# Patient Record
Sex: Male | Born: 1955 | Race: White | Hispanic: No | Marital: Married | Smoking: Former smoker
Health system: Southern US, Community
[De-identification: ages and names within clinical notes are randomized; demographics above are authoritative.]

## PROBLEM LIST (undated history)

## (undated) DIAGNOSIS — M109 Gout, unspecified: Secondary | ICD-10-CM

## (undated) DIAGNOSIS — E079 Disorder of thyroid, unspecified: Secondary | ICD-10-CM

## (undated) DIAGNOSIS — E039 Hypothyroidism, unspecified: Secondary | ICD-10-CM

## (undated) DIAGNOSIS — I10 Essential (primary) hypertension: Secondary | ICD-10-CM

## (undated) DIAGNOSIS — Z8619 Personal history of other infectious and parasitic diseases: Secondary | ICD-10-CM

## (undated) DIAGNOSIS — K259 Gastric ulcer, unspecified as acute or chronic, without hemorrhage or perforation: Secondary | ICD-10-CM

## (undated) DIAGNOSIS — F329 Major depressive disorder, single episode, unspecified: Secondary | ICD-10-CM

## (undated) DIAGNOSIS — N189 Chronic kidney disease, unspecified: Secondary | ICD-10-CM

## (undated) DIAGNOSIS — G4733 Obstructive sleep apnea (adult) (pediatric): Secondary | ICD-10-CM

## (undated) DIAGNOSIS — K219 Gastro-esophageal reflux disease without esophagitis: Secondary | ICD-10-CM

## (undated) DIAGNOSIS — M1990 Unspecified osteoarthritis, unspecified site: Secondary | ICD-10-CM

## (undated) DIAGNOSIS — G510 Bell's palsy: Secondary | ICD-10-CM

## (undated) DIAGNOSIS — I509 Heart failure, unspecified: Secondary | ICD-10-CM

## (undated) DIAGNOSIS — D649 Anemia, unspecified: Secondary | ICD-10-CM

## (undated) DIAGNOSIS — F32A Depression, unspecified: Secondary | ICD-10-CM

## (undated) DIAGNOSIS — T7840XA Allergy, unspecified, initial encounter: Secondary | ICD-10-CM

## (undated) DIAGNOSIS — Z9981 Dependence on supplemental oxygen: Secondary | ICD-10-CM

## (undated) DIAGNOSIS — E559 Vitamin D deficiency, unspecified: Secondary | ICD-10-CM

## (undated) DIAGNOSIS — E119 Type 2 diabetes mellitus without complications: Secondary | ICD-10-CM

## (undated) DIAGNOSIS — E785 Hyperlipidemia, unspecified: Secondary | ICD-10-CM

## (undated) DIAGNOSIS — G4730 Sleep apnea, unspecified: Secondary | ICD-10-CM

## (undated) HISTORY — DX: Type 2 diabetes mellitus without complications: E11.9

## (undated) HISTORY — DX: Anemia, unspecified: D64.9

## (undated) HISTORY — DX: Gastric ulcer, unspecified as acute or chronic, without hemorrhage or perforation: K25.9

## (undated) HISTORY — DX: Depression, unspecified: F32.A

## (undated) HISTORY — DX: Hyperlipidemia, unspecified: E78.5

## (undated) HISTORY — DX: Vitamin D deficiency, unspecified: E55.9

## (undated) HISTORY — DX: Heart failure, unspecified: I50.9

## (undated) HISTORY — DX: Gastro-esophageal reflux disease without esophagitis: K21.9

## (undated) HISTORY — DX: Personal history of other infectious and parasitic diseases: Z86.19

## (undated) HISTORY — PX: EYE SURGERY: SHX253

## (undated) HISTORY — DX: Essential (primary) hypertension: I10

## (undated) HISTORY — PX: GALLBLADDER SURGERY: SHX652

## (undated) HISTORY — DX: Obstructive sleep apnea (adult) (pediatric): G47.33

## (undated) HISTORY — DX: Bell's palsy: G51.0

## (undated) HISTORY — DX: Allergy, unspecified, initial encounter: T78.40XA

## (undated) HISTORY — DX: Gout, unspecified: M10.9

## (undated) HISTORY — PX: CATARACT EXTRACTION: SUR2

## (undated) HISTORY — DX: Disorder of thyroid, unspecified: E07.9

## (undated) HISTORY — DX: Major depressive disorder, single episode, unspecified: F32.9

---

## 2009-11-21 DIAGNOSIS — K259 Gastric ulcer, unspecified as acute or chronic, without hemorrhage or perforation: Secondary | ICD-10-CM | POA: Insufficient documentation

## 2009-11-24 ENCOUNTER — Ambulatory Visit: Payer: Self-pay | Admitting: Gastroenterology

## 2009-11-24 LAB — HM COLONOSCOPY: HM Colonoscopy: NORMAL

## 2010-05-03 HISTORY — PX: SHOULDER SURGERY: SHX246

## 2010-08-05 ENCOUNTER — Ambulatory Visit: Payer: Self-pay | Admitting: Family Medicine

## 2011-03-29 ENCOUNTER — Ambulatory Visit: Payer: Self-pay | Admitting: Unknown Physician Specialty

## 2011-03-29 DIAGNOSIS — I10 Essential (primary) hypertension: Secondary | ICD-10-CM

## 2011-03-31 ENCOUNTER — Ambulatory Visit: Payer: Self-pay | Admitting: Unknown Physician Specialty

## 2011-04-28 ENCOUNTER — Inpatient Hospital Stay: Payer: Self-pay | Admitting: Surgery

## 2011-04-28 HISTORY — PX: CHOLECYSTECTOMY: SHX55

## 2011-05-03 LAB — PATHOLOGY REPORT

## 2011-05-10 ENCOUNTER — Ambulatory Visit: Payer: Self-pay | Admitting: Family Medicine

## 2011-06-04 ENCOUNTER — Ambulatory Visit: Payer: Self-pay | Admitting: Family Medicine

## 2011-06-11 ENCOUNTER — Emergency Department: Payer: Self-pay | Admitting: Emergency Medicine

## 2011-06-11 LAB — URINALYSIS, COMPLETE
Bacteria: NONE SEEN
Bilirubin,UR: NEGATIVE
Blood: NEGATIVE
Glucose,UR: 50 mg/dL (ref 0–75)
Hyaline Cast: 3
Leukocyte Esterase: NEGATIVE
Nitrite: NEGATIVE
Ph: 5 (ref 4.5–8.0)
Specific Gravity: 1.014 (ref 1.003–1.030)
Squamous Epithelial: <1
WBC UR: <1 /HPF (ref 0–5)

## 2011-06-11 LAB — COMPREHENSIVE METABOLIC PANEL
Albumin: 3.7 g/dL (ref 3.4–5.0)
Alkaline Phosphatase: 70 U/L (ref 50–136)
BUN: 20 mg/dL — ABNORMAL HIGH (ref 7–18)
Bilirubin,Total: 0.7 mg/dL (ref 0.2–1.0)
Chloride: 102 mmol/L (ref 98–107)
Co2: 25 mmol/L (ref 21–32)
Creatinine: 0.76 mg/dL (ref 0.60–1.30)
EGFR (African American): >60
EGFR (Non-African Amer.): >60
Glucose: 293 mg/dL — ABNORMAL HIGH (ref 65–99)
Osmolality: 289 (ref 275–301)
SGPT (ALT): 50 U/L
Sodium: 138 mmol/L (ref 136–145)

## 2011-06-11 LAB — DIFFERENTIAL
Basophil #: 0 10*3/uL (ref 0.0–0.1)
Basophil %: 0.7 %
Lymphocyte %: 30.9 %
Neutrophil %: 48.4 %

## 2011-06-11 LAB — LIPASE, BLOOD: Lipase: 281 U/L (ref 73–393)

## 2011-06-11 LAB — CBC
HCT: 37 % — ABNORMAL LOW (ref 40.0–52.0)
HGB: 12.7 g/dL — ABNORMAL LOW (ref 13.0–18.0)
MCHC: 34.3 g/dL (ref 32.0–36.0)
RBC: 4.32 10*6/uL — ABNORMAL LOW (ref 4.40–5.90)
WBC: 3.1 10*3/uL — ABNORMAL LOW (ref 3.8–10.6)

## 2011-06-12 LAB — URINE CULTURE

## 2012-03-13 ENCOUNTER — Observation Stay: Payer: Self-pay | Admitting: Specialist

## 2012-03-13 LAB — CBC
HCT: 37.7 % — ABNORMAL LOW (ref 40.0–52.0)
HGB: 13.2 g/dL (ref 13.0–18.0)
MCV: 90 fL (ref 80–100)
RBC: 4.19 10*6/uL — ABNORMAL LOW (ref 4.40–5.90)
WBC: 3.9 10*3/uL (ref 3.8–10.6)

## 2012-03-13 LAB — CK TOTAL AND CKMB (NOT AT ARMC)
CK, Total: 348 U/L — ABNORMAL HIGH (ref 35–232)
CK-MB: 12.4 ng/mL — ABNORMAL HIGH (ref 0.5–3.6)

## 2012-03-13 LAB — URINALYSIS, COMPLETE
Bacteria: NONE SEEN
Bilirubin,UR: NEGATIVE
Blood: NEGATIVE
Glucose,UR: NEGATIVE mg/dL (ref 0–75)
Ketone: NEGATIVE
Leukocyte Esterase: NEGATIVE
Nitrite: NEGATIVE
Specific Gravity: 1.015 (ref 1.003–1.030)
Squamous Epithelial: NONE SEEN
WBC UR: <1 /HPF (ref 0–5)

## 2012-03-13 LAB — PROTIME-INR
INR: 0.9
Prothrombin Time: 12.5 secs (ref 11.5–14.7)

## 2012-03-13 LAB — COMPREHENSIVE METABOLIC PANEL
Albumin: 3.9 g/dL (ref 3.4–5.0)
Alkaline Phosphatase: 74 U/L (ref 50–136)
BUN: 21 mg/dL — ABNORMAL HIGH (ref 7–18)
Calcium, Total: 9.2 mg/dL (ref 8.5–10.1)
Co2: 29 mmol/L (ref 21–32)
Creatinine: 0.76 mg/dL (ref 0.60–1.30)
EGFR (Non-African Amer.): >60
Osmolality: 277 (ref 275–301)
SGOT(AST): 58 U/L — ABNORMAL HIGH (ref 15–37)
SGPT (ALT): 64 U/L (ref 12–78)
Sodium: 138 mmol/L (ref 136–145)

## 2012-03-13 LAB — TROPONIN I
Troponin-I: <0.02 ng/mL
Troponin-I: <0.02 ng/mL

## 2012-03-14 LAB — CBC WITH DIFFERENTIAL/PLATELET
Basophil #: 0.1 10*3/uL (ref 0.0–0.1)
Eosinophil #: 0.2 10*3/uL (ref 0.0–0.7)
HGB: 13.3 g/dL (ref 13.0–18.0)
Lymphocyte %: 28.6 %
MCH: 31.7 pg (ref 26.0–34.0)
MCV: 91 fL (ref 80–100)
Monocyte %: 11.8 %
Neutrophil #: 2.6 10*3/uL (ref 1.4–6.5)
Neutrophil %: 54.1 %
Platelet: 116 10*3/uL — ABNORMAL LOW (ref 150–440)
RBC: 4.21 10*6/uL — ABNORMAL LOW (ref 4.40–5.90)
WBC: 4.9 10*3/uL (ref 3.8–10.6)

## 2012-03-14 LAB — CK TOTAL AND CKMB (NOT AT ARMC)
CK, Total: 284 U/L — ABNORMAL HIGH (ref 35–232)
CK-MB: 9.9 ng/mL — ABNORMAL HIGH (ref 0.5–3.6)

## 2012-03-14 LAB — LIPID PANEL: HDL Cholesterol: 21 mg/dL — ABNORMAL LOW (ref 40–60)

## 2012-03-14 LAB — TROPONIN I: Troponin-I: <0.02 ng/mL

## 2012-03-14 LAB — BASIC METABOLIC PANEL
Anion Gap: 6 — ABNORMAL LOW (ref 7–16)
BUN: 21 mg/dL — ABNORMAL HIGH (ref 7–18)
Chloride: 103 mmol/L (ref 98–107)
Creatinine: 0.85 mg/dL (ref 0.60–1.30)
Glucose: 187 mg/dL — ABNORMAL HIGH (ref 65–99)
Potassium: 4.6 mmol/L (ref 3.5–5.1)

## 2012-03-14 LAB — HEMOGLOBIN A1C: Hemoglobin A1C: 5 % (ref 4.2–6.3)

## 2012-03-14 LAB — MAGNESIUM: Magnesium: 1.9 mg/dL

## 2012-05-05 DIAGNOSIS — M109 Gout, unspecified: Secondary | ICD-10-CM | POA: Insufficient documentation

## 2012-12-18 ENCOUNTER — Ambulatory Visit: Payer: Self-pay | Admitting: Family Medicine

## 2013-05-04 ENCOUNTER — Emergency Department: Payer: Self-pay | Admitting: Emergency Medicine

## 2013-05-04 LAB — CBC WITH DIFFERENTIAL/PLATELET
Basophil #: 0 10*3/uL (ref 0.0–0.1)
Basophil %: 0.8 %
Eosinophil #: 0.1 10*3/uL (ref 0.0–0.7)
Eosinophil %: 1.8 %
HCT: 37.2 % — AB (ref 40.0–52.0)
HGB: 12.6 g/dL — ABNORMAL LOW (ref 13.0–18.0)
LYMPHS ABS: 1.1 10*3/uL (ref 1.0–3.6)
LYMPHS PCT: 19.2 %
MCH: 28.7 pg (ref 26.0–34.0)
MCHC: 33.9 g/dL (ref 32.0–36.0)
MCV: 85 fL (ref 80–100)
Monocyte #: 0.6 x10 3/mm (ref 0.2–1.0)
Monocyte %: 10.7 %
Neutrophil #: 4 10*3/uL (ref 1.4–6.5)
Neutrophil %: 67.5 %
Platelet: 111 10*3/uL — ABNORMAL LOW (ref 150–440)
RBC: 4.39 10*6/uL — ABNORMAL LOW (ref 4.40–5.90)
RDW: 15.3 % — ABNORMAL HIGH (ref 11.5–14.5)
WBC: 5.9 10*3/uL (ref 3.8–10.6)

## 2013-05-04 LAB — COMPREHENSIVE METABOLIC PANEL
ALBUMIN: 3.3 g/dL — AB (ref 3.4–5.0)
Alkaline Phosphatase: 91 U/L
Anion Gap: 5 — ABNORMAL LOW (ref 7–16)
BUN: 25 mg/dL — ABNORMAL HIGH (ref 7–18)
Bilirubin,Total: 0.8 mg/dL (ref 0.2–1.0)
CALCIUM: 9.1 mg/dL (ref 8.5–10.1)
CO2: 32 mmol/L (ref 21–32)
Chloride: 100 mmol/L (ref 98–107)
Creatinine: 0.95 mg/dL (ref 0.60–1.30)
EGFR (Non-African Amer.): >60
Glucose: 123 mg/dL — ABNORMAL HIGH (ref 65–99)
Osmolality: 280 (ref 275–301)
POTASSIUM: 4.4 mmol/L (ref 3.5–5.1)
SGOT(AST): 31 U/L (ref 15–37)
SGPT (ALT): 33 U/L (ref 12–78)
Sodium: 137 mmol/L (ref 136–145)
Total Protein: 7.8 g/dL (ref 6.4–8.2)

## 2013-05-04 LAB — URIC ACID: Uric Acid: 8.4 mg/dL — ABNORMAL HIGH (ref 3.5–7.2)

## 2013-05-04 LAB — PRO B NATRIURETIC PEPTIDE: B-TYPE NATIURETIC PEPTID: 521 pg/mL — AB (ref 0–125)

## 2013-05-04 LAB — SEDIMENTATION RATE: Erythrocyte Sed Rate: 72 mm/hr — ABNORMAL HIGH (ref 0–20)

## 2013-05-07 ENCOUNTER — Ambulatory Visit: Payer: Self-pay | Admitting: Family Medicine

## 2013-05-11 ENCOUNTER — Ambulatory Visit: Payer: Self-pay | Admitting: Family Medicine

## 2013-09-11 ENCOUNTER — Ambulatory Visit: Payer: Self-pay | Admitting: Family Medicine

## 2013-09-25 ENCOUNTER — Ambulatory Visit (INDEPENDENT_AMBULATORY_CARE_PROVIDER_SITE_OTHER): Payer: BC Managed Care – PPO | Admitting: Podiatry

## 2013-09-25 ENCOUNTER — Encounter: Payer: Self-pay | Admitting: Podiatry

## 2013-09-25 ENCOUNTER — Ambulatory Visit (INDEPENDENT_AMBULATORY_CARE_PROVIDER_SITE_OTHER): Payer: BC Managed Care – PPO

## 2013-09-25 VITALS — BP 110/64 | HR 84 | Resp 16 | Ht 71.0 in | Wt 270.0 lb

## 2013-09-25 DIAGNOSIS — M779 Enthesopathy, unspecified: Secondary | ICD-10-CM

## 2013-09-25 DIAGNOSIS — M722 Plantar fascial fibromatosis: Secondary | ICD-10-CM

## 2013-09-25 MED ORDER — DICLOFENAC SODIUM 75 MG PO TBEC: 75.0000 mg | DELAYED_RELEASE_TABLET | Freq: Two times a day (BID) | ORAL | Status: DC

## 2013-09-25 MED ORDER — TRIAMCINOLONE ACETONIDE 10 MG/ML IJ SUSP
10.0000 mg | Freq: Once | INTRAMUSCULAR | Status: AC
Start: 2013-09-25 — End: 2013-09-25
  Administered 2013-09-25: 10 mg

## 2013-09-25 NOTE — Patient Instructions (Signed)

## 2013-09-25 NOTE — Progress Notes (Signed)
   Subjective:    Patient ID: Patrick Hurst, male    DOB: 12/10/1955, 58 y.o.   MRN: [id number]  HPI Comments: Its my right foot. It hurts in the arch, under the toes, ankle and knee. Its been hurting for 3 - 4 months or longer. It hurts to walk. Its gotten worse. i took ibuprofen for my foot. i had gout in both of my feet back in January and dr fisher got rid of that.  Foot Pain Associated symptoms include fatigue and numbness.      Review of Systems  Constitutional: Positive for fatigue.  HENT: Positive for sinus pressure.   Cardiovascular: Positive for leg swelling.  Musculoskeletal:       Joint pain  Skin: Positive for color change.  Neurological: Positive for light-headedness and numbness.  All other systems reviewed and are negative.      Objective:   Physical Exam        Assessment & Plan:

## 2013-09-25 NOTE — Progress Notes (Signed)
Subjective:     Patient ID: Patrick Hurst, male   DOB: [birth year], 58 y.o.   MRN: [id number]  Foot Pain   patient points to right arch stating that has been very sore for at least 3 months and that it's hard to work on hard to walk and he has to work on [employer]. Also stated he was diagnosed with gout in January and is on allopurinol for it   Review of Systems  All other systems reviewed and are negative.      Objective:   Physical Exam  Nursing note and vitals reviewed. Constitutional: He is oriented to person, place, and time.  Cardiovascular: Intact distal pulses.   Musculoskeletal: Normal range of motion.  Neurological: He is oriented to person, place, and time.  Skin: Skin is warm.   neurovascular status intact with patient having moderate equinus condition diminished range of motion of the subtalar midtarsal joint and adequate muscle strength. I noted there to be quite a bit of discomfort in the mid arch area right with edema within the area but it appears the plantar fascia is intact. Digits are well perfused the patient is obese and not in the best of health    Assessment:     What appears to be plantar fasciitis right with inflammation and some compensatory pain that he experiences    Plan:     H&P and x-rays reviewed. Injected the mid arch area right 3 mg Kenalog 5 mg Xylocaine Marcaine mixture and dispensed night splint with all instructions on usage. Placed on voltaren  75 mg twice a day

## 2013-10-09 ENCOUNTER — Encounter: Payer: Self-pay | Admitting: Podiatry

## 2013-10-09 ENCOUNTER — Ambulatory Visit (INDEPENDENT_AMBULATORY_CARE_PROVIDER_SITE_OTHER): Payer: BC Managed Care – PPO | Admitting: Podiatry

## 2013-10-09 VITALS — BP 141/64 | HR 83 | Resp 16

## 2013-10-09 DIAGNOSIS — M722 Plantar fascial fibromatosis: Secondary | ICD-10-CM

## 2013-10-09 DIAGNOSIS — R609 Edema, unspecified: Secondary | ICD-10-CM

## 2013-10-09 MED ORDER — PREDNISONE 10 MG PO KIT: PACK | ORAL | Status: DC

## 2013-10-09 MED ORDER — TRIAMCINOLONE ACETONIDE 10 MG/ML IJ SUSP
10.0000 mg | Freq: Once | INTRAMUSCULAR | Status: AC
Start: 2013-10-09 — End: 2013-10-09
  Administered 2013-10-09: 10 mg

## 2013-10-09 NOTE — Progress Notes (Signed)
Subjective:     Patient ID: Patrick Hurst, male   DOB: 1955/08/18, 58 y.o.   MRN: [id number]  HPI patient states that he has a lot of pain in his mid arch area right and that he has to be on his feet at all times and that it's not been able to rest. States that he's been using his splint at nighttime   Review of Systems     Objective:   Physical Exam Neurovascular status intact no change in health history with discomfort in the mid arch area right with inflammation and fluid with most of the pain more distal than wear was at last visit    Assessment:     Continued severe plantar fasciitis with edema noted of the right arch    Plan:     Reviewed condition and at this point I applied Unna boot after injection to the distal arch and applied air fracture walker to completely immobilize. If any change in color and toes occurs take the boot off immediately if not to leave on for 4 days and I also placed on a steroid Dosepak and advised that I want him to follow his sugar closely for the next several days. Reappoint in 2 weeks to recheck

## 2013-10-10 ENCOUNTER — Telehealth: Payer: Self-pay | Admitting: *Deleted

## 2013-10-10 ENCOUNTER — Telehealth: Payer: Self-pay | Admitting: Podiatry

## 2013-10-10 MED ORDER — PREDNISONE 10 MG PO KIT: PACK | ORAL | Status: DC

## 2013-10-10 NOTE — Telephone Encounter (Signed)
[facility] sent over a fax to specify 6 or 12 day prednisone pack , it is for a 12 day

## 2013-10-10 NOTE — Telephone Encounter (Signed)
LADIES THIS PATIENT CALLED AND WOULD LIKE A LETTER FOR A WORK EXCUSE FOR THE REST OF THE WEEK BECAUSE HE IS HAVING TO "GET USED" TO THE BOOT THAT HE WAS GIVEN YESTERDAY. WOULD YOU LIKE ME TO PROVIDE THIS LETTER FOR HIM?

## 2013-10-12 ENCOUNTER — Encounter: Payer: Self-pay | Admitting: Podiatry

## 2013-10-16 LAB — LIPID PANEL
Cholesterol: 138 mg/dL (ref 0–200)
HDL: 36 mg/dL (ref 35–70)
LDL Cholesterol: 67 mg/dL
TRIGLYCERIDES: 177 mg/dL — AB (ref 40–160)

## 2013-10-23 ENCOUNTER — Ambulatory Visit (INDEPENDENT_AMBULATORY_CARE_PROVIDER_SITE_OTHER): Payer: BC Managed Care – PPO | Admitting: Podiatry

## 2013-10-23 ENCOUNTER — Encounter: Payer: Self-pay | Admitting: Podiatry

## 2013-10-23 DIAGNOSIS — M722 Plantar fascial fibromatosis: Secondary | ICD-10-CM

## 2013-10-23 NOTE — Progress Notes (Signed)
Subjective:     Patient ID: Patrick Hurst, male   DOB: Jul 01, 1955, 58 y.o.   MRN: [id number]  HPI patient states that the boot has helped a lot he still has pain in his arch of the right foot when it removed   Review of Systems     Objective:   Physical Exam Neurovascular status is intact with significant reduction of discomfort right mid arch area with some inflammation still noted upon palpation    Assessment:     Hopefully improvement of plantar fasciitis with possibility for plantar nodule in the right arch with immobilization    Plan:     Continue immobilization and gradually reduce it after 2 weeks. Begin hot and cold soaks in the area and if symptoms were to get bad again we'll need to consider MRI and possible surgery of the arch right

## 2013-11-27 ENCOUNTER — Ambulatory Visit (INDEPENDENT_AMBULATORY_CARE_PROVIDER_SITE_OTHER): Payer: BC Managed Care – PPO | Admitting: Podiatry

## 2013-11-27 DIAGNOSIS — R609 Edema, unspecified: Secondary | ICD-10-CM

## 2013-11-27 DIAGNOSIS — M722 Plantar fascial fibromatosis: Secondary | ICD-10-CM

## 2013-11-27 MED ORDER — TRIAMCINOLONE ACETONIDE 10 MG/ML IJ SUSP
10.0000 mg | Freq: Once | INTRAMUSCULAR | Status: AC
  Administered 2013-11-27: 10 mg

## 2013-11-28 NOTE — Progress Notes (Signed)
Subjective:     Patient ID: Patrick Hurst, male   DOB: October 25, 1955, 58 y.o.   MRN: [id number]  HPI patient states I'm having problems with my right heel the way I did when we started. States it's been bothering him for a couple months and seems to get worse after periods of ambulation.    Review of Systems     Objective:   Physical Exam Neurovascular status intact with discomfort in the plantar heel right at the insertional point of the tendon into the calcaneus with fluid buildup noted    Assessment:     Plantar fasciitis right with inflammation and fluid around the medial tendon    Plan:     H&P reviewed and today reinjected the plantar fascia 3 mg Kenalog 5 of vesica Marcaine mixture and instructed on physical therapy supportive shoe and reappoint 4 weeks for further treatment

## 2013-12-25 ENCOUNTER — Ambulatory Visit (INDEPENDENT_AMBULATORY_CARE_PROVIDER_SITE_OTHER): Payer: BC Managed Care – PPO | Admitting: Podiatry

## 2013-12-25 VITALS — BP 144/75 | HR 75 | Resp 16

## 2013-12-25 DIAGNOSIS — M722 Plantar fascial fibromatosis: Secondary | ICD-10-CM

## 2013-12-25 MED ORDER — DICLOFENAC SODIUM 75 MG PO TBEC: 75.0000 mg | DELAYED_RELEASE_TABLET | Freq: Two times a day (BID) | ORAL | Status: DC

## 2013-12-25 NOTE — Progress Notes (Signed)
Subjective:     Patient ID: Patrick Hurst, male   DOB: April 13, 1956, 58 y.o.   MRN: [id number]  HPI patient presents stating I'm still having pain but it is mildly improved and it really seemed quite a bit improved while is taking the medicine. 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 Patient will be seen back in 8 weeks to see results and it continues to have persistent pain we are going to consider MRI to evaluate

## 2014-02-19 ENCOUNTER — Ambulatory Visit (INDEPENDENT_AMBULATORY_CARE_PROVIDER_SITE_OTHER): Payer: BC Managed Care – PPO | Admitting: Podiatry

## 2014-02-19 ENCOUNTER — Encounter: Payer: Self-pay | Admitting: Podiatry

## 2014-02-19 DIAGNOSIS — M722 Plantar fascial fibromatosis: Secondary | ICD-10-CM

## 2014-02-19 MED ORDER — DICLOFENAC SODIUM 75 MG PO TBEC: 75.0000 mg | DELAYED_RELEASE_TABLET | Freq: Two times a day (BID) | ORAL | Status: DC

## 2014-02-19 MED ORDER — TRIAMCINOLONE ACETONIDE 10 MG/ML IJ SUSP
10.0000 mg | Freq: Once | INTRAMUSCULAR | Status: AC
  Administered 2014-02-19: 10 mg

## 2014-02-19 NOTE — Progress Notes (Signed)
Subjective:     Patient ID: Patrick Hurst, male   DOB: Apr 12, 1956, 58 y.o.   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 Patient is not currently interested in surgery or MRI and we will try to hold off and less it were to become worse or grow in size. Prescribed diclofenac 75 mg twice a day to continue taking as he is tolerating well with no stomach symptoms

## 2014-02-22 ENCOUNTER — Ambulatory Visit: Payer: BC Managed Care – PPO | Admitting: Podiatry

## 2014-04-18 LAB — TSH: TSH: 2.95 u[IU]/mL (ref <=5.90)

## 2014-06-04 LAB — BASIC METABOLIC PANEL
BUN: 22 mg/dL — AB (ref 4–21)
Creatinine: 1.2 mg/dL (ref <=1.3)
Glucose: 194 mg/dL
Potassium: 4.2 mmol/L (ref 3.4–5.3)
Sodium: 143 mmol/L (ref 137–147)

## 2014-06-04 LAB — HEPATIC FUNCTION PANEL
ALT: 32 U/L (ref 10–40)
AST: 35 U/L (ref 14–40)

## 2014-06-18 ENCOUNTER — Encounter: Payer: Self-pay | Admitting: Podiatry

## 2014-06-18 ENCOUNTER — Ambulatory Visit (INDEPENDENT_AMBULATORY_CARE_PROVIDER_SITE_OTHER): Payer: BLUE CROSS/BLUE SHIELD | Admitting: Podiatry

## 2014-06-18 VITALS — BP 143/65 | HR 72 | Resp 18

## 2014-06-18 DIAGNOSIS — M722 Plantar fascial fibromatosis: Secondary | ICD-10-CM

## 2014-06-18 MED ORDER — TRAMADOL HCL 50 MG PO TABS: 50.0000 mg | ORAL_TABLET | Freq: Three times a day (TID) | ORAL | Status: DC

## 2014-06-18 MED ORDER — TRIAMCINOLONE ACETONIDE 10 MG/ML IJ SUSP
10.0000 mg | Freq: Once | INTRAMUSCULAR | Status: AC
  Administered 2014-06-18: 10 mg

## 2014-06-19 NOTE — Progress Notes (Signed)
Subjective:     Patient ID: Patrick Hurst, male   DOB: [birth year], 59 y.o.   MRN: [id number]  HPI patient points to right foot stating I'm doing pretty good with this with discomfort still present but improved from when we started. 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 If any growth were to occur we will do an MRI and I offered to him at this time but he denies wanting it currently as he is not interested in surgery or if there's any other issues associated with this

## 2014-06-21 ENCOUNTER — Ambulatory Visit: Payer: BC Managed Care – PPO | Admitting: Podiatry

## 2014-08-14 LAB — HEMOGLOBIN A1C: HEMOGLOBIN A1C: 5.7 % (ref 4.0–6.0)

## 2014-08-14 LAB — CBC AND DIFFERENTIAL
HEMATOCRIT: 35 % — AB (ref 41–53)
HEMOGLOBIN: 11.8 g/dL — AB (ref 13.5–17.5)
PLATELETS: 126 10*3/uL — AB (ref 150–399)
WBC: 8.8 10^3/mL

## 2014-08-15 ENCOUNTER — Ambulatory Visit
Admit: 2014-08-15 | Disposition: A | Discharge status: Home / Self Care | Payer: Self-pay | Attending: Family Medicine | Admitting: Family Medicine

## 2014-08-20 NOTE — Consult Note (Signed)
Referring Physician:  Gladstone Lighter :   Primary Care Physician:  Gladstone Lighter : [facility], [facility], [address], [place], [place] [address], [place]  Reason for Consult:  Admit Date: 13-Mar-2012   Chief Complaint: L facial droop   Reason for Consult: CVA   History of Present Illness:  History of Present Illness:   59 yo RHD M presents to ER yesterday after waking up with a L facial droop and feeling like his face was slightly numb.  He has never had anything like this before.  He denies any diplopia, arm/leg weakness or numbness or headache.  He does feel like his vision is slightly blurry in the L eye.  ROS:   General denies complaints    HEENT blurry vision in L eye    Lungs no complaints    Cardiac no complaints    GI no complaints    GU no complaints    Musculoskeletal no complaints    Extremities no complaints    Skin no complaints    Neuro L facial droop and some numbness of face    Endocrine no complaints    Psych no complaints   Past Medical/Surgical Hx:  reflux:   history of stomach ulcer:   Hypercholesterolemia:   HTN:   diabetes:   Cholecystectomy:   Shoulder Surgery - Right:   Past Medical/ Surgical Hx:   Past Medical History as above    Past Surgical History as above   Home Medications: Medication Instructions Last Modified Date/Time  Calcium 600+D 600 mg-200 intl units oral tablet 1 tab(s) orally once a day (in the morning) 11-Nov-13 10:10  Fish Oil 1000 mg oral capsule 1 cap(s) orally 2 times a day 11-Nov-13 10:10  metformin 1000 mg oral tablet 1 tab(s) orally 2 times a day 11-Nov-13 10:10  multivitamin 1 tab(s) orally once a day (in the morning) 11-Nov-13 10:10  potassium gluconate 595 mg oral tablet 1 tab(s) orally once a day (in the morning) 11-Nov-13 10:10  PriLOSEC OTC 20 mg oral delayed release tablet 1 tab(s) orally once a day (in the morning) 11-Nov-13 10:10  Vitamin B-12 1000 mcg  oral tablet 1 tab(s) orally once a day (in the morning) 11-Nov-13 10:10  Vitamin C 1000 mg oral tablet 1 tab(s) orally once a day (in the morning) 11-Nov-13 10:10  Humulin R 100 units/mL injectable solution 92 unit(s) subcutaneous once a day (in the morning) and 89 units every evening (U-500 concentrate) 11-Nov-13 10:10  gemfibrozil 600 mg oral tablet 1 tab(s) orally 2 times a day 11-Nov-13 10:10  levothyroxine 75 mcg (0.075 mg) oral tablet 1 tab(s) orally once a day (at bedtime) 11-Nov-13 10:10  hydrochlorothiazide-lisinopril 12.5 mg-20 mg oral tablet 1 tab(s) orally 2 times a day 11-Nov-13 10:10  metoprolol tartrate 50 mg oral tablet 1 tab(s) orally 2 times a day 11-Nov-13 10:10   Allergies:  No Known Allergies:   Vital Signs: **Vital Signs.:   12-Nov-13 08:45   Vital Signs Type Routine   Temperature Temperature (F) 98.5   Celsius 36.9   Temperature Source oral   Pulse Pulse 91   Respirations Respirations 17   Systolic BP Systolic BP 601   Diastolic BP (mmHg) Diastolic BP (mmHg) 91   Mean BP 113   Pulse Ox % Pulse Ox % 93   Pulse Ox Activity Level  At rest   Oxygen Delivery Room Air/ 21 %   Physical Exam:  General: overweight, no acute distress  HEENT: normocephalic, sclera nonicteric, oropharynx clear   Neck: supple, no JVD, no bruits   Chest: CTA B, no wheezing, good movement   Cardiac: RRR, no murmur, 2+ pulses   Extremities: no C/C/E, FROM   Neurologic Exam:  Mental Status: A+Ox4, trace dysarthria, nl language   Cranial Nerves: PERRLA, EOMI, nl VF, L peripheral CN VII palsy, slight decreased hearing on L, tongue midline, shoulder shrug equal   Motor Exam: 5/5 B, nl tone   Deep Tendon Reflexes: trace bilateral, downgoing plantars B   Sensory Exam: intact to temp, pinprick but decreased vibration at the ankles   Coordination: FTN and HTS WNL B   Lab Results: Thyroid:  11-Nov-13 10:11    Thyroid Stimulating Hormone 3.40 (0.45-4.50 (International Unit)   ----------------------- Pregnant patients have  different reference  ranges for TSH:  - - - - - - - - - -  Pregnant, first trimetser:  0.36 - 2.50 uIU/mL)  Hepatic:  11-Nov-13 10:11    Bilirubin, Total 0.5   Alkaline Phosphatase 74   SGPT (ALT) 64   SGOT (AST)  58   Total Protein, Serum 7.8   Albumin, Serum 3.9  Routine Chem:  12-Nov-13 03:21    Glucose, Serum  187   BUN  21   Creatinine (comp) 0.85   Sodium, Serum 138   Potassium, Serum 4.6   Chloride, Serum 103   CO2, Serum 29   Calcium (Total), Serum 8.9   Anion Gap  6   Osmolality (calc) 284   eGFR (African American) >60   eGFR (Non-African American) >60 (eGFR values <33m/min/1.73 m2 may be an indication of chronic kidney disease (CKD). Calculated eGFR is useful in patients with stable renal function. The eGFR calculation will not be reliable in acutely ill patients when serum creatinine is changing rapidly. It is not useful in  patients on dialysis. The eGFR calculation may not be applicable to patients at the low and high extremes of body sizes, pregnant women, and vegetarians.)   Magnesium, Serum 1.9 (1.8-2.4 THERAPEUTIC RANGE: 4-7 mg/dL TOXIC: > 10 mg/dL  -----------------------)   Hemoglobin A1c ([facility]) 5.0 (The American Diabetes Association recommends that a primary goal of therapy should be <7% and that physicians should reevaluate the treatment regimen in patients with HbA1c values consistently >8%.)   Cholesterol, Serum 180   Triglycerides, Serum  594   HDL (INHOUSE)  21   VLDL Cholesterol Calculated SEE COMMENT   LDL Cholesterol Calculated SEE COMMENT   Result Comment LDL/VLDL - Unable to report VLDL and LDL due to a  - Triglyceride value that is 400 mg/dL or   - greater....tpl  Result(s) reported on 14 Mar 2012 at 03:56AM.  Cardiac:  12-Nov-13 03:21    Troponin I < 0.02 (0.00-0.05 0.05 ng/mL or less: NEGATIVE  Repeat testing in 3-6 hrs  if clinically indicated. >0.05 ng/mL: POTENTIAL   MYOCARDIAL INJURY. Repeat  testing in 3-6 hrs if  clinically indicated. NOTE: An increase or decrease  of 30% or more on serial  testing suggests a  clinically important change)   CK, Total  284   CPK-MB, Serum  9.9 (Result(s) reported on 14 Mar 2012 at 03:58AM.)  Routine UA:  11-Nov-13 10:11    Color (UA) Yellow   Clarity (UA) Clear   Glucose (UA) Negative   Bilirubin (UA) Negative   Ketones (UA) Negative   Specific Gravity (UA) 1.015   Blood (UA) Negative   pH (UA) 6.0   Protein (UA)  100 mg/dL   Nitrite (UA) Negative   Leukocyte Esterase (UA) Negative (Result(s) reported on 13 Mar 2012 at 12:44PM.)   RBC (UA) NONE SEEN   WBC (UA) <1 /HPF   Bacteria (UA) NONE SEEN   Epithelial Cells (UA) NONE SEEN  Result(s) reported on 13 Mar 2012 at 12:44PM.  Routine Coag:  12-Nov-13 03:21    Prothrombin 12.6   INR 0.9 (INR reference interval applies to patients on anticoagulant therapy. A single INR therapeutic range for coumarins is not optimal for all indications; however, the suggested range for most indications is 2.0 - 3.0. Exceptions to the INR Reference Range may include: Prosthetic heart valves, acute myocardial infarction, prevention of myocardial infarction, and combinations of aspirin and anticoagulant. The need for a higher or lower target INR must be assessed individually. Reference: The Pharmacology and Management of the Vitamin K  antagonists: the seventh ACCP Conference on Antithrombotic and Thrombolytic Therapy. EFEOF.1219 Sept:126 (3suppl): N9146842. A HCT value >55% may artifactually increase the PT.  In one study,  the increase was an average of 25%. Reference:  "Effect on Routine and Special Coagulation Testing Values of Citrate Anticoagulant Adjustment in Patients with High HCT Values." American Journal of Clinical Pathology 2006;126:400-405.)  Routine Hem:  12-Nov-13 03:21    WBC (CBC) 4.9   RBC (CBC)  4.21   Hemoglobin (CBC) 13.3   Hematocrit (CBC)  38.2    Platelet Count (CBC)  116   MCV 91   MCH 31.7   MCHC 34.8   RDW  14.8   Neutrophil % 54.1   Lymphocyte % 28.6   Monocyte % 11.8   Eosinophil % 4.3   Basophil % 1.2   Neutrophil # 2.6   Lymphocyte # 1.4   Monocyte # 0.6   Eosinophil # 0.2   Basophil # 0.1 (Result(s) reported on 14 Mar 2012 at 03:43AM.)   Radiology Results: MRI:    11-Nov-13 15:36, MRI Brain Without Contrast   MRI Brain Without Contrast    REASON FOR EXAM:    left facial palsy  COMMENTS:       PROCEDURE: MR  - MR BRAIN WO CONTRAST  - Mar 13 2012  3:36PM     RESULT: History: Facial palsy.    Comparison Study: Head CT of 03/13/2012.    Findings: Multiplanar, multisequence imaging of the brain is obtained.   Diffusion-weighted images reveal no evidence of acute ischemia. An old   lacunar infarct right lenticular nucleus is noted. Vascular flow voids   are normal. Orbits and paranasal sinuses are clear.    IMPRESSION:  No evidence of acute ischemia. 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          Verified By: Osa Craver, M.D., MD  CT:    11-Nov-13 10:47, CT Head Without Contrast   CT Head Without Contrast    REASON FOR EXAM:    left facial droop, headache and right grip weakness.  COMMENTS:       PROCEDURE: CT  - CT HEAD WITHOUT CONTRAST  - Mar 13 2012 10:47AM     RESULT: Comparison:  None    Technique: Multiple axial images from the foramen magnum to the vertex   were obtained without IV contrast.    Findings:      There is no evidence of mass effect, midline shift, or extra-axial fluid   collections.  There is no evidence of a space-occupying lesion or   intracranial hemorrhage.  There is no evidence of a cortical-based area of     acute infarction.      The ventricles and sulci are appropriate for the patient's age. The basal   cisterns are patent.    Visualized portions of the orbits are unremarkable. The visualized   portions of the paranasalsinuses and mastoid  air cells are unremarkable.     The osseous structures are unremarkable.    IMPRESSION:      No acute intracranial process.      Dictation Site: 1          Verified By: Jennette Banker, M.D., MD   Radiology Impression:  Radiology Impression: MRI of brain personally reviewed by me and shows no evidence of acute infarct, trace white matter changes   Impression/Recommendations:  Recommendations:   labs reviewed and noted increased triglycerides reviewed d/w referring physician   L Bell's Palsy-  mild but could slightly worsen over the next few days;  prognosis is good Diabetes-  controlled on some study drug at [facility] Elevated triglycerides-  still uncontrolled  start Prednisone 67m PO x 5 days, then taper by 148mper day until off start eye drops QID and would get a L eye patch check with [facility] to make sure this is ok with study drug needs to f/u with Neurology in 3-4 weeks pt can go home today  Electronic Signatures: SmJamison NeighborMD)  (Signed [id number])  Authored: REFERRING PHYSICIAN, Primary Care Physician, Consult, History of Present Illness, Review of Systems, PAST MEDICAL/SURGICAL HISTORY, HOME MEDICATIONS, ALLERGIES, NURSING VITAL SIGNS, Physical Exam-, LAB RESULTS, RADIOLOGY RESULTS, Recommendations   Last Updated: 12-Nov-13 09:38 by SmJamison NeighborMD)

## 2014-08-20 NOTE — H&P (Signed)
PATIENT NAME:  Patrick Hurst, Patrick Hurst MR#:  [id number] DATE OF BIRTH:  20-Aug-1955  DATE OF ADMISSION:  03/13/2012  ADMITTING PHYSICIAN: Gladstone Lighter, MD  PRIMARY CARE PHYSICIAN: Lelon Huh, MD  PRIMARY ENDOCRINOLOGIST: Festus Aloe.   CHIEF COMPLAINT: Left facial droop.   HISTORY OF PRESENT ILLNESS: Patrick Hurst is an obese 59 year old Caucasian male with past medical history significant for hypertension, non-insulin-dependent diabetes mellitus, and hyperlipidemia who comes to the hospital secondary to the above-mentioned complaints going on for about a day. The patient denies any prior history of cerebrovascular accident or transient ischemic attack. He said he woke up fine yesterday morning and after eating lunch around 2 o'clock  p.m. yesterday afternoon he felt funny around his mouth with tingling, numbness, and felt that his face was slightly drooped towards the left side. He felt slightly better, did not pay much attention. He went to bed and woke up this morning and felt that his symptoms were getting worse. Even while eating this morning, he felt that the food was pooling up on the left side of his mouth and felt that the left side was not working at all so he came to the hospital. He does have obvious left facial droop on examination, but he denies any other new motor or sensory symptoms of his upper or lower extremities. Because of his diabetic neuropathy, he does have decreased sensation of both his hands and also both his feet, which is not new. He denies any decrease in strength, slurred speech, or difficulty swallowing food.   PAST MEDICAL HISTORY:  1. Insulin-dependent diabetes mellitus for which he is on a study drug at [place].  2. Hypertriglyceridemia.  3. Hypertension.  4. Hypothyroidism.  5. Hyperlipidemia.   ALLERGIES TO MEDICATIONS: No known drug allergies.   PAST SURGICAL HISTORY:  1. Right shoulder tendon repair.  2. Cholecystectomy.   HOME  MEDICATIONS: 1. Calcium with vitamin D 600 mg/200 mg one tablet p.o. daily.  2. Fish oil 1000 mg p.o. twice a day. 3. Gemfibrozil 600 mg p.o. twice a day. 4. Study drug for diabetes, 92 units subcutaneous in the morning and 89 units in the evening. Person to contact regarding this is Milana at [facility], telephone number [phone]. 5. Metformin 1000 mg p.o. twice a day. 6. Levothyroxine 75 mcg p.o. daily.  7. Lisinopril/HCTZ 20/12.5 mg one tablet p.o. twice a day. 8. Metoprolol 50 mg twice a day.  9. Multivitamin 1 tablet p.o. daily. 10. Potassium gluconate 595 mg tablet daily.  11. Prilosec over-the-counter 20 mg p.o. daily.  12. Vitamin B12 1000 mg p.o. daily.  13. Vitamin C 1000 mg p.o. daily.  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 No smoking or alcohol or drug use.   FAMILY HISTORY: Father with heart disease. Mom with chronic obstructive pulmonary disease and emphysema. Maternal grandmother with cancer of the reproductive organs and aunt with unknown cancer.   REVIEW OF SYSTEMS: CONSTITUTIONAL: No fatigue, fever, or weakness. EYES: Blurred vision of the left eye which is new starting yesterday. Cataract in the right eye which is not mature yet. No glaucoma or inflammation. ENT: No tinnitus, ear pain, hearing loss, epistaxis or discharge. No cough, wheeze, hemoptysis, or chronic obstructive pulmonary disease. CARDIOVASCULAR: No chest pain, orthopnea, edema, arrhythmia, palpitations, or syncope. GI: No nausea, vomiting, diarrhea, abdominal pain, hematemesis, or melena. GENITOURINARY: No dysuria, hematuria, renal calculus, frequency, or incontinence. ENDOCRINE: No polyuria, nocturia, thyroid problems, or heat or cold intolerance. HEMATOLOGY:  No anemia, easy bruising, or bleeding. SKIN: No acne, rash, or lesions. MUSCULOSKELETAL: No neck, back, shoulder pain, arthritis, or gout. NEUROLOGIC: Left facial droop. No prior CVA, transient ischemic attack, or seizures.  PSYCHOLOGICAL: No anxiety, insomnia, or depression.   PHYSICAL EXAMINATION:   VITAL SIGNS: Temperature 99 degrees Fahrenheit, pulse 80, respirations 18, blood pressure 200/93, and pulse oximetry 95% on room air.   GENERAL: Heavily built, well nourished male sitting in bed, not in any acute distress.   HEENT: Normocephalic, atraumatic. Left facial droop obvious. Pupils equal, round, and reacting to light. Anicteric sclerae. Extraocular movements intact. Oropharynx clear without erythema, mass, or exudate.   NECK: Supple. No thyromegaly, JVD, or carotid bruits. No lymphadenopathy.   PULMONARY: Moving air bilaterally. No wheeze or crackles. No use of accessory muscles for breathing.   CARDIOVASCULAR: S1 and S2 regular rate and rhythm. No murmurs, rubs, or gallops.   ABDOMEN: Obese, soft, nontender, and nondistended. No hepatosplenomegaly. Normal bowel sounds.   EXTREMITIES: No pedal edema. No clubbing or cyanosis. 2+ dorsalis pedis pulses palpable bilaterally.   SKIN: No acne, rash, or lesions.   LYMPHATICS: No cervical lymphadenopathy.   NEUROLOGIC: He does have obvious left facial droop with seventh cranial nerve or motor neuron type of paralysis. Other cranial nerves seem to be intact. There is decreased sensation of both his hands and also feet which is chronic. No new sensory symptoms and strength is 4/5 of the distal extremities mostly due to sensory losses, but otherwise proximal muscle groups is 5/5.   PSYCHOLOGICAL: The patient is awake, alert, and oriented x3.   LABORATORY, DIAGNOSTIC AND RADIOLOGIC DATA: WBC 3.9, hemoglobin 13.3, hematocrit 37.7, and platelet count 112.  Sodium 138, potassium 4.1, chloride 104, bicarbonate 29, BUN 21, creatinine 0.76, glucose 23m and calcium 9.2.   ALT 64, AST 58, alkaline phosphatase 74, total bilirubin 0.5, and albumin 3.9.   Troponin is less than 0.02. CK 348 and CK-MB 12.4.   CT of the head without contrast is showing no acute  intracranial process is seen.   Urinalysis is negative for any infection.   TSH is 3.4. INR is 0.9.  EKG: Normal sinus rhythm.   ASSESSMENT AND PLAN: A 59 year old male with history of hypertension, diabetes, and hyperlipidemia who comes in for left facial droop and left eye blurriness starting yesterday. 1. Left facial droop, possible cerebrovascular accident. Could also be Bell's palsy, especially since it is low motor neuron type affecting just the face without involvement of other extremities, but left eye blurriness would not go with it, so we will get a MRI of brain, carotid Dopplers, and Echo. We will start aspirin, check a lipid profile, and admit to telemetry.  2. Diabetes mellitus. Blood sugars have been on the low normal size so we will just do sliding scale insulin, Hemoglobin A1c, and metformin. Hold the study drug for now.  3. Hypertension. Continue home medications. Permissive hypertension allowed due to acute possible cerebrovascular accident.  4. Hyperlipidemia. Continue home medications. 5. GI and DVT prophylaxis. On Prilosec and subcutaneous heparin.             CODE STATUS: FULL CODE.   TIME SPENT ON ADMISSION: 50 minutes. ____________________________ Gladstone Lighter, MD rk:slb D: 03/13/2012 15:29:44 ET T: 03/13/2012 16:18:58 ET JOB#: [id number]  cc: Gladstone Lighter, MD, <Dictator> Kirstie Peri. Caryn Section, MD Gladstone Lighter MD ELECTRONICALLY SIGNED 03/13/2012 17:09

## 2014-08-20 NOTE — Discharge Summary (Signed)
PATIENT NAME:  Patrick Hurst, Patrick Hurst MR#:  [id number] DATE OF BIRTH:  Jun 25, 1955  DATE OF ADMISSION:  03/13/2012 DATE OF DISCHARGE:  03/14/2012  For a detailed note, please see the History and Physical done on admission by Dr. Tressia Miners.   DIAGNOSES AT DISCHARGE:  1. Bell's palsy.  2. Left-sided facial weakness and numbness secondary to Bell's palsy.  3. Diabetes.  4. Hypertension.  5. Hyperlipidemia.  6. Hypothyroidism.   DIET: The patient is being discharged on a low-sodium, low-fat, American Diabetic Association diet.   ACTIVITY: As tolerated.   FOLLOWUP: Follow up with Dr. Lelon Huh in the next 1 to 2 weeks.    DISCHARGE MEDICATIONS: 1. Gemfibrozil 600 mg b.i.d.  2. Synthroid 35 mcg daily.  3. Hydrochlorothiazide/lisinopril 12.5/20, 1 tab b.i.d.  4. Metoprolol tartrate 50 mg b.i.d.  5. Calcium with vitamin D 1 tab daily.  6. Fish oil 1000 mg b.i.d.  7. Metformin 1000 mg b.i.d.  8. Multivitamin daily.  9. Potassium gluconate 595 mg daily.  10. Prilosec OTC 20 mg daily.  11. Vitamin B12 1000 mcg daily.  12. Vitamin C 1000 mg daily.  13. Humulin R 92 units in the morning and 89 units in the evening. This is a  U500 concentrate. Prednisone 60 mg daily x 5 days, then to be tapered over the next five days by 10 mg.  14. Artificial Tears, one drop to each affected eye 4 times daily as needed.   CONSULTANTS: Dr. Jayme Cloud from neurology.   PERTINENT STUDIES:  1. CT of the head done without contrast showing no acute intracranial abnormality.  2. MRI of the brain done without contrast showing no evidence of acute ischemia, old lacunar infarct.  3. Two-dimensional echocardiogram showing left ventricular systolic function to be normal, ejection fraction of 60%, mild mitral regurgitation, no apparent source of cerebrovascular accident.   HOSPITAL COURSE: This is a 59 year old male who presented to the hospital with left-sided facial droop and weakness, perioral numbness, and some left  eye blurriness.  1. Left-sided facial numbness, weakness, and blurry vision. The patient was admitted to the hospital for possible work-up of stroke given his symptoms although he had a negative work-up including CT of the head and MRI of the brain that showed no evidence of acute stroke. The patient was seen by neurology, who thought that the patient likely had Bell's palsy rather than an acute stroke. The patient was therefore being discharged on a prednisone taper as stated along with Artificial Tears.  He will follow up with neurology in the next few weeks.  2. Diabetes. The patient had no evidence of hypo- or hyperglycemia. He will resume his metformin and his U500 concentrate insulin as stated.  3. Hypothyroidism. The patient was maintained on his Synthroid and he will continue that. 4. Hyperlipidemia. The patient was maintained on his gemfibrozil. He will continue that.  5. Gastroesophageal reflux disease. The patient was maintained on his Prilosec and he will continue that.  6. Hypertension. The patient was maintained on Zestoretic and Toprol. He will continue that upon discharge, too.   CODE STATUS: The patient is a FULL CODE.  TIME SPENT ON DISCHARGE: 40 minutes.   ____________________________ Belia Heman. Verdell Carmine, MD vjs:bjt D:  03/14/2012 16:26:26 ET          T: 03/15/2012 11:47:04 ET         JOB#: [id number]  cc: Kirstie Peri. Caryn Section, MD Henreitta Leber MD ELECTRONICALLY SIGNED 03/16/2012 8:11

## 2014-08-25 NOTE — Discharge Summary (Signed)
PATIENT NAME:  ABU, SLINGLUFF MR#:  [id number] DATE OF BIRTH:  [birth year]  DATE OF ADMISSION:  04/28/2011 DATE OF DISCHARGE:  04/30/2011  BRIEF HISTORY: Mr. Panav Berst is a 59 year old gentleman admitted through the Emergency Room with signs and symptoms consistent with biliary colic, possible acute cholecystitis. He is morbidly obese and with right upper quadrant pain which had been recurrent. Work-up in the Emergency Room revealed multiple gallstones, thickened gallbladder wall and normal liver function studies. With a diagnosis of possible acute cholecystitis surgery was recommended. He was taken to surgery on the afternoon of 04/28/2011. He underwent a laparoscopic cholecystectomy with cholangiography. The procedure was uncomplicated. He had no significant evidence of obstruction. Postoperatively he had some mild abdominal pain control problems and some mild nausea but recovered uneventfully and was discharged home on the 28th.   FINAL DISCHARGE DIAGNOSIS: Acute cholecystitis.   PROCEDURE: Laparoscopic cholecystectomy.   DISCHARGE MEDICATIONS: 1. Humulin insulin 70/30, 80 units subcutaneous t.i.d.  2. Gemfibrozil 60 mg b.i.d.  3. Levothyroxine 75 mcg p.o. daily.  4. Hydrochlorothiazide/lisinopril 12.5/20 b.i.d.  5. Metformin 1000 mg b.i.d.  6. Metoprolol 50 mg b.i.d.  7. Prilosec OTC 20 mg p.o. daily.   ____________________________ Rodena Goldmann III, MD rle:cms D: 05/07/2011 20:42:06 ET T: 05/10/2011 11:00:59 ET JOB#: [id number] cc: Micheline Maze, MD, <Dictator> Kirstie Peri. Caryn Section, MD Rodena Goldmann MD ELECTRONICALLY SIGNED 05/10/2011 20:58

## 2014-08-29 ENCOUNTER — Ambulatory Visit
Admit: 2014-08-29 | Disposition: A | Discharge status: Home / Self Care | Payer: Self-pay | Attending: Gastroenterology | Admitting: Gastroenterology

## 2014-08-29 ENCOUNTER — Other Ambulatory Visit: Payer: Self-pay

## 2014-09-24 LAB — SURGICAL PATHOLOGY

## 2014-10-16 ENCOUNTER — Encounter: Payer: Self-pay | Admitting: Family Medicine

## 2014-10-16 ENCOUNTER — Ambulatory Visit (INDEPENDENT_AMBULATORY_CARE_PROVIDER_SITE_OTHER): Payer: BLUE CROSS/BLUE SHIELD | Admitting: Family Medicine

## 2014-10-16 VITALS — BP 162/58 | HR 66 | Temp 98.5°F | Resp 16 | Ht 71.0 in | Wt 286.4 lb

## 2014-10-16 DIAGNOSIS — J069 Acute upper respiratory infection, unspecified: Secondary | ICD-10-CM | POA: Diagnosis not present

## 2014-10-16 MED ORDER — HYDROCODONE-HOMATROPINE 5-1.5 MG/5ML PO SYRP: 5.0000 mL | ORAL_SOLUTION | Freq: Three times a day (TID) | ORAL | Status: DC | PRN

## 2014-10-16 MED ORDER — ALBUTEROL SULFATE HFA 108 (90 BASE) MCG/ACT IN AERS: 2.0000 | INHALATION_SPRAY | RESPIRATORY_TRACT | Status: DC | PRN

## 2014-10-16 NOTE — Patient Instructions (Addendum)
Discussed use of Saline nasal spray, and antihistamine for drip.

## 2014-10-16 NOTE — Progress Notes (Signed)
Subjective:     Patient ID: Patrick Hurst, male   DOB: 02/15/1956, 59 y.o.   MRN: [id number]  HPI  Chief Complaint  Patient presents with  . Cough    patient is present today in office with complaints of cough and congestion since Sunday. Associated symptoms are; SOB, runny nose/stuffy, sweats and headache. Patient has taken OTC Delsym with no relief, he reports that when he checked his O2 and home this morning it was 88%  Was treated in April and May with 3 courses of abx (last Levaquin) for bronchitis. States he has felt well until exposed to a niece with a cold recently. No hx of asthma with remote hx of smoking. No prior use of respiratory medication   Review of Systems  Constitutional: Negative for fever and chills.       Objective:   Physical Exam Ears: T.M's intact without inflammation Throat: no tonsillar enlargement or exudate Neck: no cervical adenopathy Lungs: clear     Assessment:     1. Upper respiratory infection  - HYDROcodone-homatropine (HYCODAN) 5-1.5 MG/5ML syrup; Take 5 mLs by mouth every 8 (eight) hours as needed for cough.  Dispense: 120 mL; Refill: 0 - albuterol (PROVENTIL HFA;VENTOLIN HFA) 108 (90 BASE) MCG/ACT inhaler; Inhale 2 puffs into the lungs every 4 (four) hours as needed for wheezing or shortness of breath.  Dispense: 1 Inhaler; Refill: 1    Plan:    Discussed use of otc medication. He will call if not improving over the next two days

## 2014-10-18 ENCOUNTER — Other Ambulatory Visit: Payer: Self-pay | Admitting: Family Medicine

## 2014-10-18 ENCOUNTER — Ambulatory Visit: Payer: BLUE CROSS/BLUE SHIELD | Admitting: Podiatry

## 2014-10-18 ENCOUNTER — Telehealth: Payer: Self-pay | Admitting: Family Medicine

## 2014-10-18 DIAGNOSIS — J40 Bronchitis, not specified as acute or chronic: Secondary | ICD-10-CM

## 2014-10-18 MED ORDER — CEFDINIR 300 MG PO CAPS: 300.0000 mg | ORAL_CAPSULE | Freq: Two times a day (BID) | ORAL | Status: DC

## 2014-10-18 NOTE — Telephone Encounter (Signed)
Several attempts to contact patient. Unable to leave message due to VM not being set up yet. Will try again later.

## 2014-10-18 NOTE — Telephone Encounter (Signed)
Pt's wife stated that they were supposed to let Patrick Hurst know today if he wasn't feeling better since his OV on Wednesday 10/16/14. [facility]: [name]. Thanks TNP

## 2014-10-18 NOTE — Telephone Encounter (Signed)
Patient reports that he still has a productive cough with green phlegm. Denies any fever. Patient has a runny nose and notices some wheezing mainly at night. Patient reports that he is taking his inhaler every 4 hours and he also uses his breathing treatments at night. He says that his PO2 is stays around the mid to upper 80s. Patient is thinking he may need an antibiotic called into the pharmacy. He uses [facility] on [address]

## 2014-10-18 NOTE — Telephone Encounter (Signed)
Antibiotic sent in to the pharmacy

## 2014-10-22 ENCOUNTER — Ambulatory Visit: Payer: BLUE CROSS/BLUE SHIELD

## 2014-10-29 ENCOUNTER — Encounter: Payer: Self-pay | Admitting: Podiatry

## 2014-10-29 ENCOUNTER — Ambulatory Visit (INDEPENDENT_AMBULATORY_CARE_PROVIDER_SITE_OTHER): Payer: BLUE CROSS/BLUE SHIELD | Admitting: Podiatry

## 2014-10-29 ENCOUNTER — Ambulatory Visit (INDEPENDENT_AMBULATORY_CARE_PROVIDER_SITE_OTHER): Payer: BLUE CROSS/BLUE SHIELD

## 2014-10-29 DIAGNOSIS — M722 Plantar fascial fibromatosis: Secondary | ICD-10-CM

## 2014-10-29 DIAGNOSIS — M14671 Charcot's joint, right ankle and foot: Secondary | ICD-10-CM | POA: Diagnosis not present

## 2014-10-29 DIAGNOSIS — E1149 Type 2 diabetes mellitus with other diabetic neurological complication: Secondary | ICD-10-CM

## 2014-10-29 DIAGNOSIS — E1140 Type 2 diabetes mellitus with diabetic neuropathy, unspecified: Secondary | ICD-10-CM | POA: Diagnosis not present

## 2014-10-29 DIAGNOSIS — R609 Edema, unspecified: Secondary | ICD-10-CM | POA: Diagnosis not present

## 2014-10-31 NOTE — Progress Notes (Signed)
Patient ID: Patrick Hurst, male   DOB: [birth year], 59 y.o.   MRN: [id number]  Subjective: 59 year old male presents the office they for follow-up evaluation of right foot pain, swelling. He states that over the last couple months he has continued to have pain to his foot and he is noticed the swelling has not resolved. States that he has discomfort with trying to ambulate. He has no pain at rest. States that he has had injections the area without any relief and he is also been immobilized for sharp and time. Denies any history of injury or trauma to the area and denies any change increase activities and time off of the symptoms. He denies any redness or any increase in warmth to the right foot/ankle. He also states that he has neuropathy. No other complaints at this time. No acute changes since last appointment.  Objective: AAO 3, NAD DP/PT pulses palpable, CRT less than 3 seconds Protective sensation decreased with Derrel Nip motto,, decreased by [employer] sensation, Achilles tendon reflex intact. On the right foot there is moderate edema to the foot which showed apparently has been unchanged over the last 1 year. There is also skin changes resembling lymphedema. There is no overlying erythema or increase in warmth to the right lower extremity compared to the contralateral extremity. There is a flatfoot deformity present bilaterally with equinus present. On the plantar aspect of the right midfoot there does appear to be a soft tissue mass in this area however subjective this area is not grown and there is no pain overlying the area. There is no significant deformity present to the right foot. There is no areas of pinpoint bony tenderness or pain the vibratory sensation. No open lesions or pre-ulcerative lesions. No pain with calf compression, swelling, warmth, erythema.  Assessment: 59 year old male with right foot pain/swelling; possible Charcot  Plan: -X-rays were obtained and reviewed with the  patient. When comparing to the old x-rays performed pressing 1 year ago there are changes of the midfoot. These changes could represent Charcot. -Treatment options discussed including all alternatives, risks, and complications -Unna boot applied to help with swelling -Recommended immobilization. He has a CAM walker at home and discussed with him to wear this at all times. Also recommended NWB but he states he cannot do this -Monitor closely for any increase in swelling, warmth or any redness of the right foot. If any changes are to occur to call the office immediately. -Follow-up 3 week or sooner if any problems arise. In the meantime, encouraged to call the office with any questions, concerns, change in symptoms. At that time would also likely him evaluated by [facility] for a possible brace.   Celesta Gentile, DPM

## 2014-11-06 NOTE — Telephone Encounter (Signed)
Left message for patient to call back. Called to see if patient was feeling any better.

## 2014-11-14 ENCOUNTER — Encounter: Payer: Self-pay | Admitting: Podiatry

## 2014-11-14 ENCOUNTER — Encounter (INDEPENDENT_AMBULATORY_CARE_PROVIDER_SITE_OTHER): Payer: BLUE CROSS/BLUE SHIELD | Admitting: Podiatry

## 2014-11-14 ENCOUNTER — Ambulatory Visit (INDEPENDENT_AMBULATORY_CARE_PROVIDER_SITE_OTHER): Payer: BLUE CROSS/BLUE SHIELD

## 2014-11-14 ENCOUNTER — Ambulatory Visit (INDEPENDENT_AMBULATORY_CARE_PROVIDER_SITE_OTHER): Payer: BLUE CROSS/BLUE SHIELD | Admitting: Podiatry

## 2014-11-14 DIAGNOSIS — R609 Edema, unspecified: Secondary | ICD-10-CM

## 2014-11-14 DIAGNOSIS — M79671 Pain in right foot: Secondary | ICD-10-CM | POA: Diagnosis not present

## 2014-11-14 DIAGNOSIS — M25571 Pain in right ankle and joints of right foot: Secondary | ICD-10-CM

## 2014-11-14 DIAGNOSIS — M2142 Flat foot [pes planus] (acquired), left foot: Principal | ICD-10-CM

## 2014-11-14 DIAGNOSIS — E1140 Type 2 diabetes mellitus with diabetic neuropathy, unspecified: Secondary | ICD-10-CM

## 2014-11-14 DIAGNOSIS — M2141 Flat foot [pes planus] (acquired), right foot: Secondary | ICD-10-CM

## 2014-11-14 DIAGNOSIS — E1149 Type 2 diabetes mellitus with other diabetic neurological complication: Secondary | ICD-10-CM

## 2014-11-14 DIAGNOSIS — M14671 Charcot's joint, right ankle and foot: Secondary | ICD-10-CM | POA: Diagnosis not present

## 2014-11-17 NOTE — Progress Notes (Signed)
Patient ID: Patrick Hurst, male   DOB: 08-26-1955, 59 y.o.   MRN: [id number]  Subjective: Patrick Hurst presents the office they for follow-up evaluation of right foot pain, swelling.  He states these remain in a Cam Walker. He does believe that the swelling has decreased the right foot since last appointment. He denies any increase in warmth or redness the foot/leg. He continues to have pain to the bottom of his foot mostly on the midfoot.  No other complaints at this time. No acute changes since last appointment.  Objective: AAO 3, NAD DP/PT pulses palpable, CRT less than 3 seconds Protective sensation decreased with Simms Weinstein  Monofilament,, decreased  vibratorysensation, Achilles tendon reflex intact. On the right foot there is moderate edema to the foot  Although this does appear to be improved middle last appointment. There are skin lines present. There is no overlying erythema or increase in warmth to the right lower extremity compared to the contralateral extremity. There is a flatfoot deformity present bilaterally with equinus present. On the plantar aspect of the right midfoot there does appear to be an increase in soft tissue/rocker bottom type deformity in this area however subjective this area is not grown and there is no pain overlying the area. The right foot is wider than the left. There is no significant deformity present to the right foot. There is no areas of pinpoint bony tenderness or pain the vibratory sensation. No open lesions or pre-ulcerative lesions. No pain with calf compression, swelling, warmth, erythema.  Assessment: 59 year old male with right foot pain/swelling; possible Charcot  Plan: -X-rays were obtained and reviewed with the patient. Appear unchanged. -Treatment options discussed including all alternatives, risks, and complications -Recommended to continue immobilization in the CAM boot until brace can be made. -At today's appointment he was evaluated by  [facility] and casted for a brace. -Monitor closely for any increase in swelling, warmth or any redness of the right foot. If any changes are to occur to call the office immediately. -New CAM boot was dispensed per his request.  -Follow-up 4week or sooner if any problems arise. In the meantime, encouraged to call the office with any questions, concerns, change in symptoms.   Celesta Gentile, DPM

## 2014-11-17 NOTE — Progress Notes (Signed)
Entered in error. Another note already dictated for same day.

## 2014-12-12 ENCOUNTER — Ambulatory Visit (INDEPENDENT_AMBULATORY_CARE_PROVIDER_SITE_OTHER): Payer: BLUE CROSS/BLUE SHIELD | Admitting: Podiatry

## 2014-12-12 DIAGNOSIS — M14671 Charcot's joint, right ankle and foot: Secondary | ICD-10-CM

## 2014-12-12 NOTE — Progress Notes (Signed)
Betha fitted and dispensed brace. Break in instructions were discussed with the patient. Follow-up in 3 weeks or sooner if any problems are to arise. Call if questions or concerns in the meantime.

## 2014-12-26 ENCOUNTER — Ambulatory Visit: Payer: BLUE CROSS/BLUE SHIELD | Admitting: Podiatry

## 2015-01-09 ENCOUNTER — Ambulatory Visit: Payer: BLUE CROSS/BLUE SHIELD

## 2015-01-10 ENCOUNTER — Other Ambulatory Visit: Payer: Self-pay | Admitting: *Deleted

## 2015-01-10 MED ORDER — METOPROLOL TARTRATE 50 MG PO TABS: 50.0000 mg | ORAL_TABLET | Freq: Two times a day (BID) | ORAL | Status: DC

## 2015-01-10 NOTE — Telephone Encounter (Signed)
Refill request for metoprolol 50 mg Last filled by MD on- 08/20/2014 #180  x1 Last Appt: 10/16/14 saw Carmon Ginsberg Next Appt: none Please advise refill?

## 2015-01-29 ENCOUNTER — Telehealth: Payer: Self-pay | Admitting: *Deleted

## 2015-01-29 NOTE — Telephone Encounter (Signed)
Pt states he is waiting on a brace for his foot, but must be in a boot until then.  Pt states his boot is tearing up.  Informed pt I would email [facility] that he would be coming by tomorrow to pick up a new boot.  Pt states understanding.

## 2015-02-12 ENCOUNTER — Ambulatory Visit (INDEPENDENT_AMBULATORY_CARE_PROVIDER_SITE_OTHER): Payer: BLUE CROSS/BLUE SHIELD | Admitting: Family Medicine

## 2015-02-12 VITALS — BP 140/74 | HR 87 | Temp 98.2°F | Resp 16 | Ht 71.0 in | Wt 306.0 lb

## 2015-02-12 DIAGNOSIS — G510 Bell's palsy: Secondary | ICD-10-CM | POA: Insufficient documentation

## 2015-02-12 DIAGNOSIS — J40 Bronchitis, not specified as acute or chronic: Secondary | ICD-10-CM

## 2015-02-12 DIAGNOSIS — E291 Testicular hypofunction: Secondary | ICD-10-CM | POA: Diagnosis not present

## 2015-02-12 DIAGNOSIS — E039 Hypothyroidism, unspecified: Secondary | ICD-10-CM | POA: Insufficient documentation

## 2015-02-12 DIAGNOSIS — E1140 Type 2 diabetes mellitus with diabetic neuropathy, unspecified: Secondary | ICD-10-CM | POA: Insufficient documentation

## 2015-02-12 DIAGNOSIS — R0683 Snoring: Secondary | ICD-10-CM | POA: Insufficient documentation

## 2015-02-12 DIAGNOSIS — I10 Essential (primary) hypertension: Secondary | ICD-10-CM | POA: Insufficient documentation

## 2015-02-12 DIAGNOSIS — E669 Obesity, unspecified: Secondary | ICD-10-CM

## 2015-02-12 DIAGNOSIS — J309 Allergic rhinitis, unspecified: Secondary | ICD-10-CM | POA: Insufficient documentation

## 2015-02-12 DIAGNOSIS — F32A Depression, unspecified: Secondary | ICD-10-CM

## 2015-02-12 DIAGNOSIS — D509 Iron deficiency anemia, unspecified: Secondary | ICD-10-CM

## 2015-02-12 DIAGNOSIS — Z0000 Encounter for general adult medical examination without abnormal findings: Secondary | ICD-10-CM

## 2015-02-12 DIAGNOSIS — R7989 Other specified abnormal findings of blood chemistry: Secondary | ICD-10-CM

## 2015-02-12 DIAGNOSIS — G629 Polyneuropathy, unspecified: Secondary | ICD-10-CM | POA: Insufficient documentation

## 2015-02-12 DIAGNOSIS — R0602 Shortness of breath: Secondary | ICD-10-CM | POA: Insufficient documentation

## 2015-02-12 DIAGNOSIS — G4733 Obstructive sleep apnea (adult) (pediatric): Secondary | ICD-10-CM | POA: Diagnosis not present

## 2015-02-12 DIAGNOSIS — F329 Major depressive disorder, single episode, unspecified: Secondary | ICD-10-CM | POA: Diagnosis not present

## 2015-02-12 DIAGNOSIS — E119 Type 2 diabetes mellitus without complications: Secondary | ICD-10-CM | POA: Diagnosis not present

## 2015-02-12 DIAGNOSIS — N529 Male erectile dysfunction, unspecified: Secondary | ICD-10-CM | POA: Insufficient documentation

## 2015-02-12 DIAGNOSIS — E785 Hyperlipidemia, unspecified: Secondary | ICD-10-CM | POA: Insufficient documentation

## 2015-02-12 DIAGNOSIS — R609 Edema, unspecified: Secondary | ICD-10-CM | POA: Insufficient documentation

## 2015-02-12 DIAGNOSIS — Z794 Long term (current) use of insulin: Secondary | ICD-10-CM | POA: Diagnosis not present

## 2015-02-12 DIAGNOSIS — Z23 Encounter for immunization: Secondary | ICD-10-CM

## 2015-02-12 DIAGNOSIS — G5600 Carpal tunnel syndrome, unspecified upper limb: Secondary | ICD-10-CM

## 2015-02-12 DIAGNOSIS — R195 Other fecal abnormalities: Secondary | ICD-10-CM | POA: Insufficient documentation

## 2015-02-12 HISTORY — DX: Bell's palsy: G51.0

## 2015-02-12 HISTORY — DX: Carpal tunnel syndrome, unspecified upper limb: G56.00

## 2015-02-12 MED ORDER — AMOXICILLIN 500 MG PO CAPS: 1000.0000 mg | ORAL_CAPSULE | Freq: Two times a day (BID) | ORAL | Status: AC

## 2015-02-12 MED ORDER — METFORMIN HCL ER (MOD) 1000 MG PO TB24: 1000.0000 mg | ORAL_TABLET | Freq: Two times a day (BID) | ORAL | Status: DC

## 2015-02-12 NOTE — Progress Notes (Signed)
Patient: Patrick Hurst, Male    DOB: 05-01-1956, 59 y.o.   MRN: [id number] Visit Date: 02/12/2015  Today's Provider: Lelon Huh, MD   Chief Complaint  Patient presents with  . Annual Exam  . Diabetes  . Hypertension  . Hypothyroidism  . Shortness of Breath   Subjective:    Annual physical exam Patrick Hurst is a 59 y.o. male who presents today for health maintenance and complete physical. He feels fairly well. He reports exercising some. He reports he is sleeping fairly well.  -----------------------------------------------------------------   Hypothyroid, follow-up:  TSH  Date Value Ref Range Status  04/18/2014 2.95 .41 - 5.90 uIU/mL Final   Wt Readings from Last 3 Encounters:  02/12/15 306 lb (138.801 kg)  10/16/14 286 lb 6.4 oz (129.91 kg)  09/25/13 270 lb (122.471 kg)    ------------------------------------------------------------------------    Diabetes Mellitus Type II, Follow-up:   Lab Results  Component Value Date   HGBA1C 5.7 08/14/2014   HGBA1C 5.0 03/14/2012   Last seen for diabetes 6 months ago.  Management since then includes none. He reports good compliance with treatment. He is not having side effects. none Current symptoms include some numbness and cracks in right foot and have been stable. Home blood sugar records: fasting range: 160-190  Episodes of hypoglycemia? no   Current Insulin Regimen: 40 units in the am and 60 units in the pm Most Recent Eye Exam: due Weight trend: slightly increased Prior visit with dietician: no Current diet: in general, a "healthy" diet   Current exercise: walking  ----------------------------------------------------------------------   Hypertension, follow-up:  BP Readings from Last 3 Encounters:  02/12/15 140/74  11/14/14 154/70  10/16/14 162/58    He was last seen for hypertension 6 months ago.  BP at that visit was 170/82. Management since that visit includes none .He reports  good compliance with treatment. He is not having side effects. none  He is not exercising. He is adherent to low salt diet.   Outside blood pressures are 140/70. He is experiencing none.  Patient denies none.   Cardiovascular risk factors include diabetes mellitus.  Use of agents associated with hypertension: none.   ----------------------------------------------------------------------  Cough Complains of persistent cough productive green sputum the last 4-5 days. No dyspnea. No chest pain.     Review of Systems  Constitutional: Positive for fatigue.  HENT: Positive for congestion.   Respiratory: Positive for apnea, cough and shortness of breath.   Gastrointestinal: Positive for diarrhea.  Endocrine: Positive for polyphagia.  Genitourinary: Positive for frequency.  Musculoskeletal: Positive for arthralgias.  Skin: Positive for color change.       Has some color changes in ankle area bilateral, looks brused   Neurological: Positive for numbness.       Bilateral feet    Social History He  reports that he quit smoking about 36 years ago. His smoking use included Cigarettes. He has a 15 pack-year smoking history. He does not have any smokeless tobacco history on file. He reports that he does not drink alcohol or use illicit drugs. Social History   Social History  . Marital Status: Married    Spouse Name: N/A  . Number of Children: N/A  . Years of Education: N/A   Occupational History  . Works at [employer]     Full time   Social History Main Topics  . Smoking status: Former Smoker -- 1.00 packs/day for 15 years  Types: Cigarettes    Quit date: 05/03/1978  . Smokeless tobacco: Not on file  . Alcohol Use: No  . Drug Use: No  . Sexual Activity: Not on file   Other Topics Concern  . Not on file   Social History Narrative    Patient Active Problem List   Diagnosis Date Noted  . Allergic rhinitis 02/12/2015  . Bell palsy 02/12/2015  . Carpal tunnel syndrome  02/12/2015  . Clinical depression 02/12/2015  . Type 2 diabetes mellitus ([place]) 02/12/2015  . Edema 02/12/2015  . Erectile dysfunction 02/12/2015  . HLD (hyperlipidemia) 02/12/2015  . Hypertension 02/12/2015  . Hypothyroidism 02/12/2015  . Anemia, iron deficiency 02/12/2015  . Low testosterone 02/12/2015  . Neuropathy ([place]) 02/12/2015  . Obesity 02/12/2015  . Peripheral neuropathy ([place]) 02/12/2015  . Shortness of breath on exertion 02/12/2015  . Obstructive sleep apnea 02/12/2015  . Snores 02/12/2015  . Gout 05/05/2012  . Gastric ulcer 11/21/2009    Past Surgical History  Procedure Laterality Date  . Gallbladder surgery    . Shoulder surgery Right 2012    Dr. Leanor Kail  . Cholecystectomy  04/28/2011    Laproscopic; Dr. Pat Patrick  . Cataract extraction Bilateral 2014 and 2015  . Upper gi endoscopy  11/24/2009    [facility]: Impression- Normal Esophagus.- Gastric Ulcer. This was biopsied.- Gastritis. The exam was otherwise normal. Normal examined Duodenum    Family History  Family Status  Relation Status Death Age  . Mother Deceased   . Father Alive    His family history includes COPD in his mother; Heart disease in his father.    Allergies  Allergen Reactions  . Mucinex  [Guaifenesin Er]     Throat swelling, increases heart rate    Previous Medications   ALBUTEROL (PROVENTIL HFA;VENTOLIN HFA) 108 (90 BASE) MCG/ACT INHALER    Inhale 2 puffs into the lungs every 4 (four) hours as needed for wheezing or shortness of breath.   ALLOPURINOL (ZYLOPRIM) 300 MG TABLET    Take 300 mg by mouth daily.   AMLODIPINE-BENAZEPRIL (LOTREL) 10-40 MG PER CAPSULE       ASCORBIC ACID (VITAMIN C) 1000 MG TABLET    Take 1,000 mg by mouth daily.   ASPIRIN 81 MG TABLET    Take 81 mg by mouth daily.   ATORVASTATIN (LIPITOR) 40 MG TABLET       B-D INS SYR ULTRAFINE 1CC/30G 30G X 1/2" 1 ML MISC       CEFDINIR (OMNICEF) 300 MG CAPSULE    Take 1 capsule (300 mg total) by mouth 2 (two) times daily.    CHLORTHALIDONE (HYGROTON) 25 MG TABLET       CHOLECALCIFEROL (VITAMIN D3 PO)    Take by mouth 2 (two) times daily.    CYANOCOBALAMIN (B-12 PO)    Take by mouth daily.   DICLOFENAC (VOLTAREN) 75 MG EC TABLET    Take 1 tablet (75 mg total) by mouth 2 (two) times daily.   FUROSEMIDE (LASIX) 40 MG TABLET       HUMULIN R 500 UNIT/ML SOLN INJECTION    Takes 40 units in the am and 60 units in the pm   HYDROCODONE-HOMATROPINE (HYCODAN) 5-1.5 MG/5ML SYRUP    Take 5 mLs by mouth every 8 (eight) hours as needed for cough.   INSULIN NPH HUMAN (HUMULIN N,NOVOLIN N) 100 UNIT/ML INJECTION    Inject into the skin daily before breakfast.   LEVOTHYROXINE (SYNTHROID, LEVOTHROID) 100 MCG TABLET  METFORMIN (GLUMETZA) 1000 MG (MOD) 24 HR TABLET    Take 1,000 mg by mouth 2 (two) times daily with a meal.    METOPROLOL (LOPRESSOR) 50 MG TABLET    Take 1 tablet (50 mg total) by mouth 2 (two) times daily.   METOPROLOL SUCCINATE (TOPROL-XL) 50 MG 24 HR TABLET    Take 50 mg by mouth daily. Take with or immediately following a meal.   MULTIPLE VITAMIN (MULTIVITAMIN) CAPSULE    Take 1 capsule by mouth daily.   OMEGA-3 FATTY ACIDS (FISH OIL PO)    Take by mouth daily.   ONE TOUCH ULTRA TEST TEST STRIP       PANTOPRAZOLE (PROTONIX) 40 MG TABLET       POTASSIUM PO    Take by mouth daily.   TRAMADOL (ULTRAM) 50 MG TABLET    Take 1 tablet (50 mg total) by mouth 3 (three) times daily.    Patient Care Team: [name], MD as PCP - General (Family Medicine)     Objective:   Vitals: BP 140/74 mmHg  Pulse 87  Temp(Src) 98.2 F (36.8 C) (Oral)  Resp 16  Ht 5\' 11"  (1.803 m)  Wt 306 lb (138.801 kg)  BMI 42.70 kg/m2  SpO2 93%   Physical Exam   General Appearance:    Alert, cooperative, no distress, appears stated age, obest  Head:    Normocephalic, without obvious abnormality, atraumatic  Eyes:    PERRL, conjunctiva/corneas clear, EOM's intact, fundi    benign, both eyes       Ears:    Normal TM's and  external ear canals, both ears  Nose:   Nares normal, septum midline, mucosa normal, no drainage   or sinus tenderness  Throat:   Lips, mucosa, and tongue normal; teeth and gums normal  Neck:   Supple, symmetrical, trachea midline, no adenopathy;       thyroid:  No enlargement/tenderness/nodules; no carotid   bruit or JVD  Back:     Symmetric, no curvature, ROM normal, no CVA tenderness  Lungs:     Clear to auscultation bilaterally, respirations unlabored  Chest wall:    No tenderness or deformity  Heart:    Regular rate and rhythm, S1 and S2 normal. III/VI systolic murmur  Abdomen:     Soft, non-tender, bowel sounds active all four quadrants,    no masses, no organomegaly  Genitalia:    deferred  Rectal:    deferred  Extremities:   Extremities normal, atraumatic, no cyanosis or edema  Pulses:   2+ and symmetric all extremities  Skin:   Skin color, texture, turgor normal, no rashes or lesions  Lymph nodes:   Cervical, supraclavicular, and axillary nodes normal  Neurologic:   CNII-XII intact. Normal strength, sensation and reflexes      throughout    Depression Screen PHQ 2/9 Scores 02/12/2015  PHQ - 2 Score 4  PHQ- 9 Score 11      Assessment & Plan:     Routine Health Maintenance and Physical Exam  Exercise Activities and Dietary recommendations Goals    None      Immunization History  Administered Date(s) Administered  . Pneumococcal Polysaccharide-23 04/05/2001  . Tdap 12/30/2006    Health Maintenance  Topic Date Due  . Hepatitis C Screening  1956-01-21  . PNEUMOCOCCAL POLYSACCHARIDE VACCINE (1) 04/06/1959  . FOOT EXAM  04/06/1967  . OPHTHALMOLOGY EXAM  04/06/1967  . URINE MICROALBUMIN  04/06/1967  . HIV Screening  04/05/1972  . TETANUS/TDAP  04/05/1976  . COLONOSCOPY  04/06/2007  . HEMOGLOBIN A1C  09/11/2012  . INFLUENZA VACCINE  12/02/2014      Discussed health benefits of physical activity, and encouraged him to engage in regular exercise  appropriate for his age and condition.    --------------------------------------------------------------------  1. Annual physical exam  - PSA  2. Anemia, iron deficiency  - CBC  3. Clinical depression   4. HLD (hyperlipidemia)  - Lipid panel  5. Essential hypertension  - EKG 12-Lead  6. Hypothyroidism, unspecified hypothyroidism type  - TSH  7. Low testosterone   8. Obesity   9. Obstructive sleep apnea   10. Type 2 diabetes mellitus without complication, with long-term current use of insulin (HCC)  - Hemoglobin A1c - Comprehensive metabolic panel  11. Bronchitis  - amoxicillin (AMOXIL) 500 MG capsule; Take 2 capsules (1,000 mg total) by mouth 2 (two) times daily.  Dispense: 40 capsule; Refill: 0  12. Need for influenza vaccination  - Flu Vaccine QUAD 36+ mos IM

## 2015-02-13 ENCOUNTER — Ambulatory Visit: Payer: BLUE CROSS/BLUE SHIELD

## 2015-02-13 ENCOUNTER — Encounter: Payer: Self-pay | Admitting: Family Medicine

## 2015-02-13 ENCOUNTER — Other Ambulatory Visit: Payer: Self-pay | Admitting: Family Medicine

## 2015-02-13 MED ORDER — METFORMIN HCL ER (MOD) 500 MG PO TB24: 1000.0000 mg | ORAL_TABLET | Freq: Two times a day (BID) | ORAL | Status: DC

## 2015-02-14 ENCOUNTER — Telehealth: Payer: Self-pay | Admitting: Family Medicine

## 2015-02-14 ENCOUNTER — Other Ambulatory Visit: Payer: Self-pay | Admitting: Family Medicine

## 2015-02-14 ENCOUNTER — Encounter: Payer: Self-pay | Admitting: Family Medicine

## 2015-02-14 DIAGNOSIS — D696 Thrombocytopenia, unspecified: Secondary | ICD-10-CM | POA: Insufficient documentation

## 2015-02-14 LAB — COMPREHENSIVE METABOLIC PANEL
ALBUMIN: 3.9 g/dL (ref 3.5–5.5)
ALK PHOS: 98 IU/L (ref 39–117)
ALT: 32 IU/L (ref 0–44)
AST: 32 IU/L (ref 0–40)
Albumin/Globulin Ratio: 1.8 (ref 1.1–2.5)
BUN / CREAT RATIO: 16 (ref 9–20)
BUN: 20 mg/dL (ref 6–24)
Bilirubin Total: 0.6 mg/dL (ref 0.0–1.2)
CO2: 26 mmol/L (ref 18–29)
CREATININE: 1.27 mg/dL (ref 0.76–1.27)
Calcium: 8.1 mg/dL — ABNORMAL LOW (ref 8.7–10.2)
Chloride: 103 mmol/L (ref 97–108)
GFR calc Af Amer: 72 mL/min/{1.73_m2} (ref >59)
GFR, EST NON AFRICAN AMERICAN: 62 mL/min/{1.73_m2} (ref >59)
GLOBULIN, TOTAL: 2.2 g/dL (ref 1.5–4.5)
Glucose: 153 mg/dL — ABNORMAL HIGH (ref 65–99)
Potassium: 4.3 mmol/L (ref 3.5–5.2)
SODIUM: 145 mmol/L — AB (ref 134–144)
Total Protein: 6.1 g/dL (ref 6.0–8.5)

## 2015-02-14 LAB — CBC
HEMOGLOBIN: 11.2 g/dL — AB (ref 12.6–17.7)
Hematocrit: 34.9 % — ABNORMAL LOW (ref 37.5–51.0)
MCH: 29.6 pg (ref 26.6–33.0)
MCHC: 32.1 g/dL (ref 31.5–35.7)
MCV: 92 fL (ref 79–97)
PLATELETS: 90 10*3/uL — AB (ref 150–379)
RBC: 3.78 x10E6/uL — AB (ref 4.14–5.80)
RDW: 15.4 % (ref 12.3–15.4)
WBC: 5.9 10*3/uL (ref 3.4–10.8)

## 2015-02-14 LAB — LIPID PANEL
Chol/HDL Ratio: 4.8 ratio units (ref 0.0–5.0)
Cholesterol, Total: 126 mg/dL (ref 100–199)
HDL: 26 mg/dL — AB (ref >39)
LDL Calculated: 27 mg/dL (ref 0–99)
Triglycerides: 365 mg/dL — ABNORMAL HIGH (ref 0–149)
VLDL CHOLESTEROL CAL: 73 mg/dL — AB (ref 5–40)

## 2015-02-14 LAB — PSA: Prostate Specific Ag, Serum: 0.3 ng/mL (ref 0.0–4.0)

## 2015-02-14 LAB — HEMOGLOBIN A1C
Est. average glucose Bld gHb Est-mCnc: 117 mg/dL
HEMOGLOBIN A1C: 5.7 % — AB (ref 4.8–5.6)

## 2015-02-14 LAB — TSH: TSH: 5.93 u[IU]/mL — AB (ref 0.450–4.500)

## 2015-02-14 MED ORDER — METFORMIN HCL 1000 MG PO TABS: 1000.0000 mg | ORAL_TABLET | Freq: Two times a day (BID) | ORAL | Status: DC

## 2015-02-14 NOTE — Telephone Encounter (Signed)
Heather from [place] has a question about pt's medication. Metformin. Thanks TNP

## 2015-02-14 NOTE — Telephone Encounter (Signed)
Returned call to pharmacy. [facility] pharmacist requested patient metformin be changed to 1000 mg x2 qd, due to cost of med. Ok per Dr. Caryn Section.

## 2015-02-20 ENCOUNTER — Other Ambulatory Visit: Payer: Self-pay | Admitting: *Deleted

## 2015-02-20 MED ORDER — LEVOTHYROXINE SODIUM 100 MCG PO TABS: 100.0000 ug | ORAL_TABLET | Freq: Every day | ORAL | Status: DC

## 2015-02-27 ENCOUNTER — Inpatient Hospital Stay: Payer: BLUE CROSS/BLUE SHIELD

## 2015-02-27 ENCOUNTER — Inpatient Hospital Stay: Payer: BLUE CROSS/BLUE SHIELD | Attending: Internal Medicine | Admitting: Internal Medicine

## 2015-02-27 ENCOUNTER — Other Ambulatory Visit: Payer: Self-pay | Admitting: *Deleted

## 2015-02-27 VITALS — BP 193/77 | HR 61 | Temp 96.1°F | Ht 71.0 in | Wt 303.1 lb

## 2015-02-27 DIAGNOSIS — R161 Splenomegaly, not elsewhere classified: Secondary | ICD-10-CM

## 2015-02-27 DIAGNOSIS — E785 Hyperlipidemia, unspecified: Secondary | ICD-10-CM | POA: Diagnosis not present

## 2015-02-27 DIAGNOSIS — E079 Disorder of thyroid, unspecified: Secondary | ICD-10-CM | POA: Insufficient documentation

## 2015-02-27 DIAGNOSIS — M109 Gout, unspecified: Secondary | ICD-10-CM | POA: Diagnosis not present

## 2015-02-27 DIAGNOSIS — K219 Gastro-esophageal reflux disease without esophagitis: Secondary | ICD-10-CM

## 2015-02-27 DIAGNOSIS — D649 Anemia, unspecified: Secondary | ICD-10-CM | POA: Insufficient documentation

## 2015-02-27 DIAGNOSIS — Z87891 Personal history of nicotine dependence: Secondary | ICD-10-CM | POA: Insufficient documentation

## 2015-02-27 DIAGNOSIS — E119 Type 2 diabetes mellitus without complications: Secondary | ICD-10-CM | POA: Insufficient documentation

## 2015-02-27 DIAGNOSIS — Z79899 Other long term (current) drug therapy: Secondary | ICD-10-CM | POA: Insufficient documentation

## 2015-02-27 DIAGNOSIS — G4733 Obstructive sleep apnea (adult) (pediatric): Secondary | ICD-10-CM | POA: Diagnosis not present

## 2015-02-27 DIAGNOSIS — I10 Essential (primary) hypertension: Secondary | ICD-10-CM | POA: Diagnosis not present

## 2015-02-27 DIAGNOSIS — D696 Thrombocytopenia, unspecified: Secondary | ICD-10-CM

## 2015-02-27 DIAGNOSIS — D539 Nutritional anemia, unspecified: Secondary | ICD-10-CM

## 2015-02-27 NOTE — Progress Notes (Signed)
[facility] CONSULT NOTE  Patient Care Team: [name], MD as PCP - General (Family Medicine)  CHIEF COMPLAINTS/PURPOSE OF CONSULTATION: Thrombocytopenia  #  2013-THROMBOCYTOPENIA [~ 110-120]  # Mild anemia [11.5] April 2016- EGD/[place]  HISTORY OF PRESENTING ILLNESS:  Patrick Hurst 59 y.o.  male morbidly obese history of diabetes and also obstructive sleep apnea has been referred was for further evaluation of thromo cytopenia.   Patient denies any unusual gum bleeding or bleeding from any other mucosal surface; denies any petechial rash. Denies any weight loss denies any loss of appetite or night sweats.  As per the wife patient has had frequent upper respiratory tract infections in the last 4 months. Denies any hemoptysis. He also denies any blood in stools black colored stools. Patient had EGD colonoscopy in April 2016.  Patient denies any history of alcohol abuse; denies any liver problems; denies history of hepatitis C.   ROS: Chronic fatigue. Chronic tingling and numbness of the feet. Chronic swelling in the legs. A complete 10 point review of system is done which is negative except mentioned above in history of present illness  MEDICAL HISTORY:  Past Medical History  Diagnosis Date  . Diabetes ([place])   . Gout   . Thyroid disease   . GERD (gastroesophageal reflux disease)   . Vitamin D deficiency   . History of chicken pox   . Anemia   . Hyperlipidemia   . Depression   . OSA (obstructive sleep apnea)   . Bell's palsy   . Hypertension   . Allergy   . Gastric ulcer     SURGICAL HISTORY: Past Surgical History  Procedure Laterality Date  . Gallbladder surgery    . Shoulder surgery Right 2012    Dr. Leanor Kail  . Cholecystectomy  04/28/2011    Laproscopic; Dr. Pat Patrick  . Cataract extraction Bilateral 2014 and 2015    SOCIAL HISTORY:  Social History   Social History  . Marital Status: Married    Spouse Name: N/A  . Number of Children: N/A   . Years of Education: N/A   Occupational History  . Works at [employer]     Full time   Social History Main Topics  . Smoking status: Former Smoker -- 1.00 packs/day for 15 years    Types: Cigarettes    Quit date: 05/03/1978  . Smokeless tobacco: Not on file  . Alcohol Use: No  . Drug Use: No  . Sexual Activity: Not on file   Other Topics Concern  . Not on file   Social History Narrative    FAMILY HISTORY: Family History  Problem Relation Age of Onset  . COPD Mother   . Heart disease Father     ALLERGIES:  is allergic to mucinex .  MEDICATIONS:  Current Outpatient Prescriptions  Medication Sig Dispense Refill  . albuterol (PROVENTIL HFA;VENTOLIN HFA) 108 (90 BASE) MCG/ACT inhaler Inhale 2 puffs into the lungs every 4 (four) hours as needed for wheezing or shortness of breath. 1 Inhaler 1  . allopurinol (ZYLOPRIM) 300 MG tablet Take 300 mg by mouth daily.    Marland Kitchen amLODipine-benazepril (LOTREL) 10-40 MG per capsule     . Ascorbic Acid (VITAMIN C) 1000 MG tablet Take 1,000 mg by mouth daily.    Marland Kitchen aspirin 81 MG tablet Take 81 mg by mouth daily.    Marland Kitchen atorvastatin (LIPITOR) 40 MG tablet     . B-D INS SYR ULTRAFINE 1CC/30G 30G X  1/2" 1 ML MISC     . Cholecalciferol (VITAMIN D3 PO) Take by mouth 2 (two) times daily.     . Cyanocobalamin (B-12 PO) Take by mouth daily.    Marland Kitchen HUMULIN R 500 UNIT/ML SOLN injection Takes 40 units in the am and 60 units in the pm    . levothyroxine (SYNTHROID, LEVOTHROID) 100 MCG tablet Take 1 tablet (100 mcg total) by mouth daily. 90 tablet 4  . metFORMIN (GLUCOPHAGE) 1000 MG tablet TAKE ONE TABLET BY MOUTH TWICE DAILY 180 tablet 0  . metoprolol (LOPRESSOR) 50 MG tablet Take 1 tablet (50 mg total) by mouth 2 (two) times daily. 180 tablet 3  . metoprolol succinate (TOPROL-XL) 50 MG 24 hr tablet Take 50 mg by mouth daily. Take with or immediately following a meal.    . Multiple Vitamin (MULTIVITAMIN) capsule Take 1 capsule by mouth daily.    .  Omega-3 Fatty Acids (FISH OIL PO) Take by mouth daily.    . ONE TOUCH ULTRA TEST test strip     . pantoprazole (PROTONIX) 40 MG tablet     . POTASSIUM PO Take by mouth daily.     No current facility-administered medications for this visit.      Marland Kitchen  PHYSICAL EXAMINATION:   Filed Vitals:   02/27/15 0853  BP: 193/77  Pulse: 61  Temp: 96.1 F (35.6 C)   Filed Weights   02/27/15 0853  Weight: 303 lb 2.1 oz (137.5 kg)    GENERAL: Well-nourished well-developed; Alert, no distress and comfortable.   Obese. EYES: no pallor or icterus OROPHARYNX: no thrush or ulceration and poor dentition   NECK: supple, no masses felt LYMPH:  no palpable lymphadenopathy in the cervical, axillary or inguinal regions LUNGS: clear to auscultation and  No wheeze or crackles HEART/CVS: regular rate & rhythm and no murmu1+ swelling lower extremity. Patient has braces on the right ankle.  ABDOMEN: abdomen soft, non-tender and normal bowel sounds; obese. No hepatosplenomegaly noted  Musculoskeletal:no cyanosis of digits and no clubbing  PSYCH: alert & oriented x 3 with fluent speech NEURO: no focal motor/sensory deficits SKIN:  no rashes or significant lesions  LABORATORY DATA:  I have reviewed the data as listed Lab Results  Component Value Date   WBC 5.9 02/13/2015   HGB 11.8* 08/14/2014   HCT 34.9* 02/13/2015   MCV 85 05/04/2013   PLT 126* 08/14/2014    Recent Labs  06/04/14 02/13/15 0806  NA 143 145*  K 4.2 4.3  CL  --  103  CO2  --  26  GLUCOSE  --  153*  BUN 22* 20  CREATININE 1.2 1.27  CALCIUM  --  8.1*  GFRNONAA  --  62  GFRAA  --  72  PROT  --  6.1  ALBUMIN  --  3.9  AST 35 32  ALT 32 32  ALKPHOS  --  98  BILITOT  --  0.6    RADIOGRAPHIC STUDIES: I have personally reviewed the radiological images as listed and agreed with the findings in the report. No results found.  ASSESSMENT & PLAN:   # Thrombocytopenia- at least since 2013 [>100-120]; more recently 90,000. mild  anemia hemoglobin 11.5. I suspect a chronic benign process rather than a malignant etiology at this time. I would recommend checking CBC CMP and LDH; serum immunoelectrophoresis/light chains; also hepatitis B/C workup. Check N36 folic acid. With regards to mild anemia check iron studies and reticulocyte count. I also recommend getting  a CT of the abdomen and pelvis to rule out any hepatosplenomegaly.   Also discussed with the patient that a bone marrow biopsy might be required if above workup is negative. He understands. Patient will follow-up in approximately 2-3 weeks to review the results of the above blood work/scans.  Thank you Dr.Fisher for allowing me to participate in the care of your pleasant patient. Please do not hesitate to contact me with questions or concerns in the interim.   All questions were answered. The patient knows to call the clinic with any problems, questions or concerns. I spent 20 minutes counseling the patient face to face. The total time spent in the appointment was 30 minutes and more than 50% was on counseling.     Cammie Sickle, MD 02/27/2015 9:24 AM

## 2015-02-28 LAB — HEPATITIS B CORE ANTIBODY, TOTAL: HEP B C TOTAL AB: NEGATIVE

## 2015-02-28 LAB — HEPATITIS B SURFACE ANTIBODY, QUANTITATIVE: Hepatitis B-Post: <3.1 m[IU]/mL — ABNORMAL LOW (ref >9.9)

## 2015-02-28 LAB — HEPATITIS C ANTIBODY: HCV Ab: <0.1 s/co ratio (ref 0.0–0.9)

## 2015-03-13 ENCOUNTER — Ambulatory Visit
Admission: RE | Admit: 2015-03-13 | Discharge: 2015-03-13 | Disposition: A | Discharge status: Home / Self Care | Payer: BLUE CROSS/BLUE SHIELD | Source: Ambulatory Visit | Attending: Internal Medicine | Admitting: Internal Medicine

## 2015-03-13 DIAGNOSIS — I7090 Unspecified atherosclerosis: Secondary | ICD-10-CM | POA: Insufficient documentation

## 2015-03-13 DIAGNOSIS — R161 Splenomegaly, not elsewhere classified: Secondary | ICD-10-CM

## 2015-03-13 DIAGNOSIS — R162 Hepatomegaly with splenomegaly, not elsewhere classified: Secondary | ICD-10-CM | POA: Diagnosis not present

## 2015-03-13 DIAGNOSIS — K7460 Unspecified cirrhosis of liver: Secondary | ICD-10-CM | POA: Insufficient documentation

## 2015-03-13 DIAGNOSIS — D696 Thrombocytopenia, unspecified: Secondary | ICD-10-CM | POA: Diagnosis present

## 2015-03-13 MED ORDER — IOHEXOL 300 MG/ML  SOLN
100.0000 mL | Freq: Once | INTRAMUSCULAR | Status: AC | PRN
  Administered 2015-03-13: 100 mL via INTRAVENOUS

## 2015-03-20 ENCOUNTER — Inpatient Hospital Stay: Payer: BLUE CROSS/BLUE SHIELD | Attending: Internal Medicine | Admitting: Internal Medicine

## 2015-03-20 VITALS — BP 188/80 | HR 62 | Temp 97.4°F | Wt 301.8 lb

## 2015-03-20 DIAGNOSIS — I517 Cardiomegaly: Secondary | ICD-10-CM | POA: Insufficient documentation

## 2015-03-20 DIAGNOSIS — Z79899 Other long term (current) drug therapy: Secondary | ICD-10-CM | POA: Insufficient documentation

## 2015-03-20 DIAGNOSIS — E079 Disorder of thyroid, unspecified: Secondary | ICD-10-CM | POA: Diagnosis not present

## 2015-03-20 DIAGNOSIS — I10 Essential (primary) hypertension: Secondary | ICD-10-CM

## 2015-03-20 DIAGNOSIS — Z7984 Long term (current) use of oral hypoglycemic drugs: Secondary | ICD-10-CM | POA: Diagnosis not present

## 2015-03-20 DIAGNOSIS — Z87891 Personal history of nicotine dependence: Secondary | ICD-10-CM | POA: Insufficient documentation

## 2015-03-20 DIAGNOSIS — Z7982 Long term (current) use of aspirin: Secondary | ICD-10-CM | POA: Insufficient documentation

## 2015-03-20 DIAGNOSIS — R200 Anesthesia of skin: Secondary | ICD-10-CM | POA: Diagnosis not present

## 2015-03-20 DIAGNOSIS — E785 Hyperlipidemia, unspecified: Secondary | ICD-10-CM | POA: Diagnosis not present

## 2015-03-20 DIAGNOSIS — D696 Thrombocytopenia, unspecified: Secondary | ICD-10-CM | POA: Diagnosis not present

## 2015-03-20 DIAGNOSIS — R162 Hepatomegaly with splenomegaly, not elsewhere classified: Secondary | ICD-10-CM | POA: Diagnosis not present

## 2015-03-20 DIAGNOSIS — K4090 Unilateral inguinal hernia, without obstruction or gangrene, not specified as recurrent: Secondary | ICD-10-CM | POA: Diagnosis not present

## 2015-03-20 DIAGNOSIS — D509 Iron deficiency anemia, unspecified: Secondary | ICD-10-CM

## 2015-03-20 DIAGNOSIS — M109 Gout, unspecified: Secondary | ICD-10-CM | POA: Diagnosis not present

## 2015-03-20 DIAGNOSIS — K7460 Unspecified cirrhosis of liver: Secondary | ICD-10-CM | POA: Insufficient documentation

## 2015-03-20 DIAGNOSIS — K219 Gastro-esophageal reflux disease without esophagitis: Secondary | ICD-10-CM | POA: Diagnosis not present

## 2015-03-20 DIAGNOSIS — D649 Anemia, unspecified: Secondary | ICD-10-CM | POA: Insufficient documentation

## 2015-03-20 DIAGNOSIS — R5382 Chronic fatigue, unspecified: Secondary | ICD-10-CM

## 2015-03-20 DIAGNOSIS — E119 Type 2 diabetes mellitus without complications: Secondary | ICD-10-CM | POA: Insufficient documentation

## 2015-03-20 DIAGNOSIS — M7989 Other specified soft tissue disorders: Secondary | ICD-10-CM | POA: Insufficient documentation

## 2015-03-20 DIAGNOSIS — G4733 Obstructive sleep apnea (adult) (pediatric): Secondary | ICD-10-CM | POA: Insufficient documentation

## 2015-03-20 DIAGNOSIS — I7090 Unspecified atherosclerosis: Secondary | ICD-10-CM | POA: Insufficient documentation

## 2015-03-20 NOTE — Progress Notes (Signed)
Patient ambulates ind w/o assistance, brought to exam room 4 with family.  Pt denies pain or discomfort.  Dr notified

## 2015-03-20 NOTE — Progress Notes (Signed)
[place] NOTE  Patient Care Team: [name], MD as PCP - General (Family Medicine)  CHIEF COMPLAINTS/PURPOSE OF CONSULTATION: Thrombocytopenia  #  2013-THROMBOCYTOPENIA [~ 90-120]- Mild anemia [11.5] April 2016 from cirrhosis/spleenomegaly. S/p EGD/Colo [Dr.Oh]   # cirrhosis/spleenomegaly- ? Etiology.   HISTORY OF PRESENTING ILLNESS:  Patrick Hurst 59 y.o.  male morbidly obese history of diabetes and also obstructive sleep apnea- chronic thrombocytopenia is here to review the results of his labs/CAT scan results.  He denies any worsening bleeding issues; Chronic fatigue. Chronic tingling and numbness of the feet. Chronic swelling in the legs.   ROS: A complete 10 point review of system is done which is negative except mentioned above in history of present illness  MEDICAL HISTORY:  Past Medical History  Diagnosis Date  . Diabetes ([place])   . Gout   . Thyroid disease   . GERD (gastroesophageal reflux disease)   . Vitamin D deficiency   . History of chicken pox   . Anemia   . Hyperlipidemia   . Depression   . OSA (obstructive sleep apnea)   . Bell's palsy   . Hypertension   . Allergy   . Gastric ulcer     SURGICAL HISTORY: Past Surgical History  Procedure Laterality Date  . Gallbladder surgery    . Shoulder surgery Right 2012    Dr. Leanor Kail  . Cholecystectomy  04/28/2011    Laproscopic; Dr. Pat Patrick  . Cataract extraction Bilateral 2014 and 2015    SOCIAL HISTORY:  Social History   Social History  . Marital Status: Married    Spouse Name: N/A  . Number of Children: N/A  . Years of Education: N/A   Occupational History  . Works at [employer]     Full time   Social History Main Topics  . Smoking status: Former Smoker -- 1.00 packs/day for 15 years    Types: Cigarettes    Quit date: 05/03/1978  . Smokeless tobacco: Not on file  . Alcohol Use: No  . Drug Use: No  . Sexual Activity: Not on file   Other Topics Concern   . Not on file   Social History Narrative    FAMILY HISTORY: Family History  Problem Relation Age of Onset  . COPD Mother   . Heart disease Father     ALLERGIES:  is allergic to mucinex .  MEDICATIONS:  Current Outpatient Prescriptions  Medication Sig Dispense Refill  . albuterol (PROVENTIL HFA;VENTOLIN HFA) 108 (90 BASE) MCG/ACT inhaler Inhale 2 puffs into the lungs every 4 (four) hours as needed for wheezing or shortness of breath. 1 Inhaler 1  . amLODipine-benazepril (LOTREL) 10-40 MG per capsule     . Ascorbic Acid (VITAMIN C) 1000 MG tablet Take 1,000 mg by mouth daily.    Marland Kitchen aspirin 81 MG tablet Take 81 mg by mouth daily.    Marland Kitchen atorvastatin (LIPITOR) 40 MG tablet     . B-D INS SYR ULTRAFINE 1CC/30G 30G X 1/2" 1 ML MISC     . Cholecalciferol (VITAMIN D3 PO) Take by mouth 2 (two) times daily.     . Cyanocobalamin (B-12 PO) Take by mouth daily.    Marland Kitchen HUMULIN R 500 UNIT/ML SOLN injection Takes 40 units in the am and 60 units in the pm    . levothyroxine (SYNTHROID, LEVOTHROID) 100 MCG tablet Take 1 tablet (100 mcg total) by mouth daily. 90 tablet 4  . metFORMIN (GLUCOPHAGE) 1000 MG  tablet TAKE ONE TABLET BY MOUTH TWICE DAILY 180 tablet 0  . metoprolol (LOPRESSOR) 50 MG tablet Take 1 tablet (50 mg total) by mouth 2 (two) times daily. 180 tablet 3  . metoprolol succinate (TOPROL-XL) 50 MG 24 hr tablet Take 50 mg by mouth daily. Take with or immediately following a meal.    . Multiple Vitamin (MULTIVITAMIN) capsule Take 1 capsule by mouth daily.    . Omega-3 Fatty Acids (FISH OIL PO) Take by mouth daily.    . ONE TOUCH ULTRA TEST test strip     . pantoprazole (PROTONIX) 40 MG tablet     . POTASSIUM PO Take by mouth daily.    Marland Kitchen allopurinol (ZYLOPRIM) 300 MG tablet Take 300 mg by mouth daily.     No current facility-administered medications for this visit.      Marland Kitchen  PHYSICAL EXAMINATION:   Filed Vitals:   03/20/15 1033  BP: 188/80  Pulse: 62  Temp: 97.4 F (36.3 C)    Filed Weights   03/20/15 1033  Weight: 301 lb 13 oz (136.9 kg)    GENERAL: Well-nourished well-developed; Alert, no distress and comfortable.   Obese; accompanied by his wife. LABORATORY DATA:  I have reviewed the data as listed Lab Results  Component Value Date   WBC 5.9 02/13/2015   HGB 11.8* 08/14/2014   HCT 34.9* 02/13/2015   MCV 85 05/04/2013   PLT 126* 08/14/2014    Recent Labs  06/04/14 02/13/15 0806  NA 143 145*  K 4.2 4.3  CL  --  103  CO2  --  26  GLUCOSE  --  153*  BUN 22* 20  CREATININE 1.2 1.27  CALCIUM  --  8.1*  GFRNONAA  --  62  GFRAA  --  72  PROT  --  6.1  ALBUMIN  --  3.9  AST 35 32  ALT 32 32  ALKPHOS  --  98  BILITOT  --  0.6    RADIOGRAPHIC STUDIES: I have personally reviewed the radiological images as listed and agreed with the findings in the report. Ct Abdomen Pelvis W Contrast  03/13/2015  CLINICAL DATA:  Thrombocytopenia and splenomegaly. Rule out hepatosplenomegaly. EXAM: CT ABDOMEN AND PELVIS WITH CONTRAST TECHNIQUE: Multidetector CT imaging of the abdomen and pelvis was performed using the standard protocol following bolus administration of intravenous contrast. CONTRAST:  133mL OMNIPAQUE IOHEXOL 300 MG/ML  SOLN COMPARISON:  06/11/2011 FINDINGS: Lower chest: Clear lung bases. Mild cardiomegaly, without pericardial or pleural effusion. Hepatobiliary: Hepatomegaly, 22.8 cm craniocaudal. Moderate cirrhosis, as evidenced by caudate lobe enlargement and irregular hepatic capsule. No focal liver lesion. Cholecystectomy, without biliary ductal dilatation. Pancreas: Normal, without mass or ductal dilatation. Spleen: Splenomegaly, 14.3 cm craniocaudal. 17.9 x 8.2 cm transverse. Adrenals/Urinary Tract: Normal adrenal glands. A Normal kidneys, without hydronephrosis. Normal urinary bladder. Stomach/Bowel: Proximal gastric underdistention. Normal remainder of the stomach. Normal colon, appendix, and terminal ileum. Normal small bowel.  Vascular/Lymphatic: Aortic and branch vessel atherosclerosis. No evidence of portal venous hypertension. Splenic and portal veins patent. Hepatic veins not well evaluated. Multiple small retroperitoneal nodes are similar. None are pathologic by size criteria. No pelvic adenopathy. Reproductive: Normal prostate. Other: No significant free fluid. Fat containing right inguinal hernia. Musculoskeletal: Bilateral L5 pars defects. Grade 1 L5-S1 anterolisthesis. IMPRESSION: 1. Cirrhosis and hepatosplenomegaly. 2. Atherosclerosis. Electronically Signed   By: Abigail Miyamoto M.D.   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 Recommend follow-up in 6 months.  # Cirrhosis-patient denies drinking alcohol. unclear etiology recommend evaluation with GI/follow-up with primary care. Also discussed that the patient has Risk of developing hepatocellular cancer with cirrhosis. Recommend AFP/ hepatic ultrasound every 6-12 months screening.  The above plan of care was discussed with the patient and his wife in detail.; I reviewed the images of the CAT scan independently.   # Patient follow-up with labs/AFP in 6 months   25 minutes face-to-face with the patient/wife-with more than 50% of time spent on counseling and coordination of above medical care.     Cammie Sickle, MD 03/20/2015 10:38 AM

## 2015-03-20 NOTE — Patient Instructions (Signed)
Cirrhosis °Cirrhosis is long-term (chronic) liver injury. The liver is your largest internal organ, and it performs many functions. The liver converts food into energy, removes toxic material from your blood, makes important proteins, and absorbs necessary vitamins from your diet. °If you have cirrhosis, it means many of your healthy liver cells have been replaced by scar tissue. This prevents blood from flowing through your liver, which makes it difficult for your liver to function. This scarring is not reversible, but treatment can prevent it from getting worse.  °CAUSES  °Hepatitis C and long-term alcohol abuse are the most common causes of cirrhosis. Other causes include: °· Nonalcoholic fatty liver disease. °· Hepatitis B infection. °· Autoimmune hepatitis. °· Diseases that cause blockage of ducts inside the liver. °· Inherited liver diseases. °· Reactions to certain long-term medicines. °· Parasitic infections. °· Long-term exposure to certain toxins. °RISK FACTORS °You may have a higher risk of cirrhosis if you: °· Have certain hepatitis viruses. °· Abuse alcohol, especially if you are male. °· Are overweight. °· Share needles. °· Have unprotected sex with someone who has hepatitis. °SYMPTOMS  °You may not have any signs and symptoms at first. Symptoms may not develop until the damage to your liver starts to get worse. Signs and symptoms of cirrhosis may include:  °· Tenderness in the right-upper part of your abdomen. °· Weakness and tiredness (fatigue). °· Loss of appetite. °· Nausea. °· Weight loss and muscle loss. °· Itchiness. °· Yellow skin and eyes (jaundice). °· Buildup of fluid in the abdomen (ascites). °· Swelling of the feet and ankles (edema). °· Appearance of tiny blood vessels under the skin. °· Mental confusion. °· Easy bruising and bleeding. °DIAGNOSIS  °Your health care provider may suspect cirrhosis based on your symptoms and medical history, especially if you have other medical conditions  or a history of alcohol abuse. Your health care provider will do a physical exam to feel your liver and check for signs of cirrhosis. Your health care provider may perform other tests, including:  °· Blood tests to check:   °¨ Whether you have hepatitis B or C.   °¨ Kidney function. °¨ Liver function. °· Imaging tests such as: °¨ MRI or CT scan to look for changes seen in advanced cirrhosis. °¨ Ultrasound to see if normal liver tissue is being replaced by scar tissue. °· A procedure using a long needle to take a sample of liver tissue (biopsy) for examination under a microscope. Liver biopsy can confirm the diagnosis of cirrhosis.   °TREATMENT  °Treatment depends on how damaged your liver is and what caused the damage. Treatment may include treating cirrhosis symptoms or treating the underlying causes of the condition to try to slow the progression of the damage. Treatment may include: °· Making lifestyle changes, such as:   °¨ Eating a healthy diet. °¨ Restricting salt intake.  °¨ Maintaining a healthy weight.   °¨ Not abusing drugs or alcohol. °· Taking medicines to: °¨ Treat liver infections or other infections. °¨ Control itching. °¨ Reduce fluid buildup. °¨ Reduce certain blood toxins. °¨ Reduce risk of bleeding from enlarged blood vessels in the stomach or esophagus (varices). °· If varices are causing bleeding problems, you may need treatment with a procedure that ties up the vessels causing them to fall off (band ligation). °· If cirrhosis is causing your liver to fail, your health care provider may recommend a liver transplant. °· Other treatments may be recommended depending on any complications of cirrhosis, such as liver-related kidney failure (hepatorenal   syndrome). °HOME CARE INSTRUCTIONS  °· Take medicines only as directed by your health care provider. Do not use drugs that are toxic to your liver. Ask your health care provider before taking any new medicines, including over-the-counter medicines.    °· Rest as needed. °· Eat a well-balanced diet. Ask your health care provider or dietitian for more information.   °· You may have to follow a low-salt diet or restrict your water intake as directed. °· Do not drink alcohol. This is especially important if you are taking acetaminophen. °· Keep all follow-up visits as directed by your health care provider. This is important. °SEEK MEDICAL CARE IF: °· You have fatigue or weakness that is getting worse. °· You develop swelling of the hands, feet, legs, or face. °· You have a fever. °· You develop loss of appetite. °· You have nausea or vomiting. °· You develop jaundice. °· You develop easy bruising or bleeding. °SEEK IMMEDIATE MEDICAL CARE IF: °· You vomit bright red blood or a material that looks like coffee grounds. °· You have blood in your stools. °· Your stools appear black and tarry. °· You become confused. °· You have chest pain or trouble breathing. °  °This information is not intended to replace advice given to you by your health care provider. Make sure you discuss any questions you have with your health care provider. °  °Document Released: 04/19/2005 Document Revised: 05/10/2014 Document Reviewed: 12/26/2013 °Elsevier Interactive Patient Education ©2016 [employer] ° °

## 2015-04-01 ENCOUNTER — Ambulatory Visit (INDEPENDENT_AMBULATORY_CARE_PROVIDER_SITE_OTHER): Payer: BLUE CROSS/BLUE SHIELD | Admitting: Podiatry

## 2015-04-01 ENCOUNTER — Ambulatory Visit (INDEPENDENT_AMBULATORY_CARE_PROVIDER_SITE_OTHER): Payer: BLUE CROSS/BLUE SHIELD

## 2015-04-01 ENCOUNTER — Encounter: Payer: Self-pay | Admitting: Podiatry

## 2015-04-01 DIAGNOSIS — R52 Pain, unspecified: Secondary | ICD-10-CM | POA: Diagnosis not present

## 2015-04-01 DIAGNOSIS — M14671 Charcot's joint, right ankle and foot: Secondary | ICD-10-CM | POA: Diagnosis not present

## 2015-04-01 DIAGNOSIS — E1149 Type 2 diabetes mellitus with other diabetic neurological complication: Secondary | ICD-10-CM | POA: Diagnosis not present

## 2015-04-02 DIAGNOSIS — M14679 Charcot's joint, unspecified ankle and foot: Secondary | ICD-10-CM | POA: Insufficient documentation

## 2015-04-02 NOTE — Progress Notes (Signed)
Patient ID: Patrick Hurst, male   DOB: Jan 24, 1956, 59 y.o.   MRN: [id number]  Subjective: Patrick Hurst presents the office they for follow-up evaluation of right foot pain, swelling and for possible Charcot. He did receive the [place] which is modified. He has had some modifications to the brace by [facility]. Once complete, he states the brace is fitting much better and is helping. His swelling as decreased and he has not had any redness or warmth. The pain to the heel has improved. No other complaints at this time. No acute changes since last appointment.  Objective: AAO 3, NAD DP/PT pulses palpable, CRT less than 3 seconds Protective sensation decreased with Simms Weinstein  Monofilament, decreased vibratory sensation On the right foot there is faint edema to the foot which is greatly improved. There is no erythema or increased warmth. There is a flatfoot deformity present. There is very mild discomfort on the plantar aspect of the heel which is greatly improving. The right foot is wider than the left. There is no significant deformity present to the left foot. There is no areas of pinpoint bony tenderness or pain the vibratory sensation. No open lesions or pre-ulcerative lesions. No pain with calf compression, swelling, warmth, erythema.  Assessment: 59 year old male with resolving right foot pain/swelling; possible Charcot  Plan: -X-rays were obtained and reviewed with the patient. Appear unchanged. -Treatment options discussed including all alternatives, risks, and complications -Continue with brace. Any issues, call the office -Discussed with him if there is any increase in swelling, redness to call the office immediately.  -Follow-up in 3 months or sooner if any problems arise. In the meantime, encouraged to call the office with any questions, concerns, change in symptoms.  *x-rry right foot next appointment.   Celesta Gentile, DPM

## 2015-04-16 ENCOUNTER — Encounter: Payer: Self-pay | Admitting: Family Medicine

## 2015-04-16 ENCOUNTER — Ambulatory Visit (INDEPENDENT_AMBULATORY_CARE_PROVIDER_SITE_OTHER): Payer: BLUE CROSS/BLUE SHIELD | Admitting: Family Medicine

## 2015-04-16 VITALS — BP 172/80 | HR 65 | Temp 98.4°F | Resp 18

## 2015-04-16 DIAGNOSIS — I10 Essential (primary) hypertension: Secondary | ICD-10-CM | POA: Diagnosis not present

## 2015-04-16 DIAGNOSIS — K769 Liver disease, unspecified: Secondary | ICD-10-CM

## 2015-04-16 DIAGNOSIS — L309 Dermatitis, unspecified: Secondary | ICD-10-CM

## 2015-04-16 DIAGNOSIS — K7400 Hepatic fibrosis, unspecified: Secondary | ICD-10-CM | POA: Insufficient documentation

## 2015-04-16 MED ORDER — HYDROCHLOROTHIAZIDE 25 MG PO TABS: 25.0000 mg | ORAL_TABLET | Freq: Every day | ORAL | Status: DC

## 2015-04-16 MED ORDER — CLOTRIMAZOLE-BETAMETHASONE 1-0.05 % EX CREA
1.0000 | TOPICAL_CREAM | Freq: Two times a day (BID) | CUTANEOUS | Status: DC
Start: 2015-04-16 — End: 2016-02-16

## 2015-04-16 NOTE — Progress Notes (Signed)
Patient: Patrick Hurst Male    DOB: 1955/05/17   59 y.o.   MRN: [id number] Visit Date: 04/16/2015  Today's Provider: Lelon Huh, MD   Chief Complaint  Patient presents with  . Hypertension   Subjective:    HPI   Hypertension, follow-up:  BP Readings from Last 3 Encounters:  04/16/15 172/80  03/20/15 188/80  02/27/15 193/77    He was last seen for hypertension 2 months ago.  BP at that visit was 140/74. Management since that visit includes no chnages. He reports good compliance with treatment. He is not having side effects.  He is not exercising. He is not adherent to low salt diet.   Outside blood pressures are 200-212 over 86-95. He is experiencing dyspnea and fatigue and swelling in lower legs.  Patient denies chest pain, chest pressure/discomfort, claudication, exertional chest pressure/discomfort, irregular heart beat, near-syncope, orthopnea, palpitations, paroxysmal nocturnal dyspnea, syncope and tachypnea.   Cardiovascular risk factors include advanced age (older than 33 for men, 68 for women), hypertension, male gender and sedentary lifestyle.      Weight trend: stable Wt Readings from Last 3 Encounters:  03/20/15 301 lb 13 oz (136.9 kg)  02/27/15 303 lb 2.1 oz (137.5 kg)  02/12/15 306 lb (138.801 kg)    Current diet: in general, an "unhealthy" diet Denies any change in medications or diet lately.  ------------------------------------------------------------------------  Rash Has had red itchy patch develop on top of his right shoulder over the last week. It is not painful. Not spreading.      Allergies  Allergen Reactions  . Mucinex  [Guaifenesin Er]     Throat swelling, increases heart rate   Previous Medications   ALBUTEROL (PROVENTIL HFA;VENTOLIN HFA) 108 (90 BASE) MCG/ACT INHALER    Inhale 2 puffs into the lungs every 4 (four) hours as needed for wheezing or shortness of breath.   ALLOPURINOL (ZYLOPRIM) 300 MG TABLET    Take 300 mg  by mouth daily.   AMLODIPINE-BENAZEPRIL (LOTREL) 10-40 MG PER CAPSULE    Take 1 capsule by mouth daily.    ASCORBIC ACID (VITAMIN C) 1000 MG TABLET    Take 1,000 mg by mouth daily.   ASPIRIN 81 MG TABLET    Take 81 mg by mouth daily.   ATORVASTATIN (LIPITOR) 40 MG TABLET       B-D INS SYR ULTRAFINE 1CC/30G 30G X 1/2" 1 ML MISC       CHOLECALCIFEROL (VITAMIN D3 PO)    Take by mouth 2 (two) times daily.    CYANOCOBALAMIN (B-12 PO)    Take by mouth daily.   HUMULIN R 500 UNIT/ML SOLN INJECTION    Takes 40 units in the am and 60 units in the pm   LEVOTHYROXINE (SYNTHROID, LEVOTHROID) 100 MCG TABLET    Take 1 tablet (100 mcg total) by mouth daily.   METFORMIN (GLUCOPHAGE) 1000 MG TABLET    TAKE ONE TABLET BY MOUTH TWICE DAILY   METOPROLOL (LOPRESSOR) 50 MG TABLET    Take 1 tablet (50 mg total) by mouth 2 (two) times daily.   MULTIPLE VITAMIN (MULTIVITAMIN) CAPSULE    Take 1 capsule by mouth daily.   OMEGA-3 FATTY ACIDS (FISH OIL PO)    Take by mouth daily.   ONE TOUCH ULTRA TEST TEST STRIP       PANTOPRAZOLE (PROTONIX) 40 MG TABLET       POTASSIUM PO    Take by mouth daily.  Review of Systems  Constitutional: Positive for fatigue. Negative for fever, chills and appetite change.  Respiratory: Positive for cough and shortness of breath. Negative for chest tightness and wheezing.   Cardiovascular: Positive for leg swelling. Negative for chest pain and palpitations.  Gastrointestinal: Negative for nausea, vomiting and abdominal pain.    Social History  Substance Use Topics  . Smoking status: Former Smoker -- 1.00 packs/day for 15 years    Types: Cigarettes    Quit date: 05/03/1978  . Smokeless tobacco: Not on file  . Alcohol Use: No   Objective:   BP 172/80 mmHg  Pulse 65  Temp(Src) 98.4 F (36.9 C) (Oral)  Resp 18  SpO2 95%  Physical Exam   General Appearance:    Alert, cooperative, no distress, obese  Eyes:    PERRL, conjunctiva/corneas clear, EOM's intact       Lungs:      Clear to auscultation bilaterally, respirations unlabored  Heart:    Regular rate and rhythm  Neurologic:   Awake, alert, oriented x 3. No apparent focal neurological           defect.   Skin:   About 3cm wide well defined dry slightly raised patch on superior aspect of right shoulder. No satellite lesions.        Assessment & Plan:     1. Essential hypertension Uncontrolled. Avoid sodium in diet. Add hctz and recheck BP in 4 weeks.  - hydrochlorothiazide (HYDRODIURIL) 25 MG tablet; Take 1 tablet (25 mg total) by mouth daily.  Dispense: 30 tablet; Refill: 1  2. Liver disease In midst of workup by Dr. Candace Cruise  3. Dermatitis  - clotrimazole-betamethasone (LOTRISONE) cream; Apply 1 application topically 2 (two) times daily.  Dispense: 30 g; Refill: 0   Lelon Huh, MD  [facility]

## 2015-04-30 ENCOUNTER — Ambulatory Visit (INDEPENDENT_AMBULATORY_CARE_PROVIDER_SITE_OTHER): Payer: BLUE CROSS/BLUE SHIELD | Admitting: Family Medicine

## 2015-04-30 ENCOUNTER — Encounter: Payer: Self-pay | Admitting: Family Medicine

## 2015-04-30 VITALS — BP 160/80 | HR 67 | Temp 99.0°F | Resp 16 | Ht 71.0 in | Wt 301.0 lb

## 2015-04-30 DIAGNOSIS — J069 Acute upper respiratory infection, unspecified: Secondary | ICD-10-CM | POA: Diagnosis not present

## 2015-04-30 MED ORDER — DOXYCYCLINE HYCLATE 100 MG PO TABS: 100.0000 mg | ORAL_TABLET | Freq: Two times a day (BID) | ORAL | Status: AC

## 2015-04-30 NOTE — Patient Instructions (Signed)
May take OTC Delsym as needed for cough

## 2015-04-30 NOTE — Progress Notes (Signed)
Patient: Patrick Hurst Male    DOB: Aug 20, 1955   59 y.o.   MRN: [id number] Visit Date: 04/30/2015  Today's Provider: Lelon Huh, MD   Chief Complaint  Patient presents with  . Cough   Subjective:    Cough This is a new problem. The current episode started in the past 7 days (04/28/2015). The problem has been gradually worsening. The problem occurs every few minutes. The cough is productive of sputum. Associated symptoms include ear congestion, headaches, myalgias, nasal congestion, postnasal drip, rhinorrhea, a sore throat, shortness of breath and wheezing. Pertinent negatives include no chest pain, chills, ear pain, fever, heartburn, hemoptysis, rash or sweats. The symptoms are aggravated by exercise and lying down. He has tried nothing for the symptoms. The treatment provided no relief. His past medical history is significant for bronchitis.    Sneezing and runny nose started 04/28/2015. Symptoms progressed to sore throat, cough, nasal drainage, and body aches. Has been using Ventolin inhaler once or twice a day which helps quite a bit.    Allergies  Allergen Reactions  . Mucinex  [Guaifenesin Er]     Throat swelling, increases heart rate   Previous Medications   ALBUTEROL (PROVENTIL HFA;VENTOLIN HFA) 108 (90 BASE) MCG/ACT INHALER    Inhale 2 puffs into the lungs every 4 (four) hours as needed for wheezing or shortness of breath.   ALLOPURINOL (ZYLOPRIM) 300 MG TABLET    Take 300 mg by mouth daily.   AMLODIPINE-BENAZEPRIL (LOTREL) 10-40 MG PER CAPSULE    Take 1 capsule by mouth daily.    ASCORBIC ACID (VITAMIN C) 1000 MG TABLET    Take 1,000 mg by mouth daily.   ASPIRIN 81 MG TABLET    Take 81 mg by mouth daily.   ATORVASTATIN (LIPITOR) 40 MG TABLET       B-D INS SYR ULTRAFINE 1CC/30G 30G X 1/2" 1 ML MISC       CHOLECALCIFEROL (VITAMIN D3 PO)    Take by mouth 2 (two) times daily.    CLOTRIMAZOLE-BETAMETHASONE (LOTRISONE) CREAM    Apply 1 application topically 2 (two)  times daily.   CYANOCOBALAMIN (B-12 PO)    Take by mouth daily.   HUMULIN R 500 UNIT/ML SOLN INJECTION    Takes 40 units in the am and 60 units in the pm   HYDROCHLOROTHIAZIDE (HYDRODIURIL) 25 MG TABLET    Take 1 tablet (25 mg total) by mouth daily.   LEVOTHYROXINE (SYNTHROID, LEVOTHROID) 100 MCG TABLET    Take 1 tablet (100 mcg total) by mouth daily.   METFORMIN (GLUCOPHAGE) 1000 MG TABLET    TAKE ONE TABLET BY MOUTH TWICE DAILY   METOPROLOL (LOPRESSOR) 50 MG TABLET    Take 1 tablet (50 mg total) by mouth 2 (two) times daily.   MULTIPLE VITAMIN (MULTIVITAMIN) CAPSULE    Take 1 capsule by mouth daily.   OMEGA-3 FATTY ACIDS (FISH OIL PO)    Take by mouth daily.   ONE TOUCH ULTRA TEST TEST STRIP       PANTOPRAZOLE (PROTONIX) 40 MG TABLET       POTASSIUM PO    Take by mouth daily.    Review of Systems  Constitutional: Negative for fever, chills and appetite change.  HENT: Positive for congestion, postnasal drip, rhinorrhea, sneezing and sore throat. Negative for ear pain and sinus pressure.   Respiratory: Positive for cough, shortness of breath and wheezing. Negative for hemoptysis and chest tightness.   Cardiovascular:  Negative for chest pain and palpitations.  Gastrointestinal: Negative for heartburn, nausea, vomiting and abdominal pain.  Musculoskeletal: Positive for myalgias.  Skin: Negative for rash.  Neurological: Positive for headaches.    Social History  Substance Use Topics  . Smoking status: Former Smoker -- 1.00 packs/day for 15 years    Types: Cigarettes    Quit date: 05/03/1978  . Smokeless tobacco: Not on file  . Alcohol Use: No   Objective:   BP 160/80 mmHg  Pulse 67  Temp(Src) 99 F (37.2 C) (Oral)  Resp 16  Ht 5\' 11"  (1.803 m)  Wt 301 lb (136.533 kg)  BMI 42.00 kg/m2  SpO2 96%  Physical Exam  General Appearance:    Alert, cooperative, no distress  HENT:   ENT exam normal, no neck nodes or sinus tenderness, neck without nodes, sinuses nontender and nasal  mucosa pale and congested  Eyes:    PERRL, conjunctiva/corneas clear, EOM's intact       Lungs:     Occasional mild expiratory wheeze.No rales, respirations unlabored  Heart:    Regular rate and rhythm  Neurologic:   Awake, alert, oriented x 3. No apparent focal neurological           defect.           Assessment & Plan:     1. Upper respiratory infection  - doxycycline (VIBRA-TABS) 100 MG tablet; Take 1 tablet (100 mg total) by mouth 2 (two) times daily.  Dispense: 14 tablet; Refill: 0  Call if symptoms change or if not rapidly improving.          Lelon Huh, MD  [facility]

## 2015-05-07 ENCOUNTER — Telehealth: Payer: Self-pay | Admitting: Family Medicine

## 2015-05-07 MED ORDER — LEVOFLOXACIN 500 MG PO TABS: 500.0000 mg | ORAL_TABLET | Freq: Every day | ORAL | Status: DC

## 2015-05-07 MED ORDER — PREDNISONE 10 MG PO TABS: ORAL_TABLET | ORAL | Status: DC

## 2015-05-07 NOTE — Telephone Encounter (Signed)
Rx's sent to pharmacy. Patient was notified. Expressed understanding.

## 2015-05-07 NOTE — Telephone Encounter (Signed)
Please send in rx for levoquin 500mg  daily for 7 days and 6 day prednisone taper. Thanks.

## 2015-05-07 NOTE — Telephone Encounter (Signed)
Pt was in last week for cough and congestion.  Pt states he still has a cough and is coughing green mucus.  Pt also has a runny nose and he is still wheezing.  Pt has taken all of the Rx.  Pt is requesting a new Rx to help with this.  [place]  [id number]

## 2015-05-14 ENCOUNTER — Ambulatory Visit (INDEPENDENT_AMBULATORY_CARE_PROVIDER_SITE_OTHER): Payer: BLUE CROSS/BLUE SHIELD | Admitting: Family Medicine

## 2015-05-14 ENCOUNTER — Encounter: Payer: Self-pay | Admitting: Family Medicine

## 2015-05-14 VITALS — BP 150/84 | HR 60 | Temp 98.8°F | Resp 16 | Ht 71.0 in | Wt 305.0 lb

## 2015-05-14 DIAGNOSIS — K769 Liver disease, unspecified: Secondary | ICD-10-CM | POA: Diagnosis not present

## 2015-05-14 DIAGNOSIS — J069 Acute upper respiratory infection, unspecified: Secondary | ICD-10-CM

## 2015-05-14 DIAGNOSIS — I10 Essential (primary) hypertension: Secondary | ICD-10-CM

## 2015-05-14 MED ORDER — HYDROCHLOROTHIAZIDE 25 MG PO TABS: 25.0000 mg | ORAL_TABLET | Freq: Every day | ORAL | Status: DC

## 2015-05-14 MED ORDER — AMOXICILLIN 500 MG PO CAPS
1000.0000 mg | ORAL_CAPSULE | Freq: Two times a day (BID) | ORAL | Status: AC
Start: 2015-05-14 — End: 2015-05-24

## 2015-05-14 NOTE — Progress Notes (Signed)
Patient: Patrick Hurst Male    DOB: 02-27-1956   60 y.o.   MRN: [id number] Visit Date: 05/14/2015  Today's Provider: Lelon Huh, MD   Chief Complaint  Patient presents with  . Follow-up  . Hypertension   Subjective:    HPI   Hypertension, follow-up:  BP Readings from Last 3 Encounters:  05/14/15 150/84  04/30/15 160/80  04/16/15 172/80    He was last seen for hypertension 1 months ago.  BP at that visit was 172/80. Management since that visit includes; uncontrolled. Added HCTZ 25 mg qd. Avoid sodium in diet.He reports good compliance with treatment. He is not having side effects. none  He is not exercising. He is adherent to low salt diet.   Outside blood pressures are 170-195/80-106. He is experiencing none.  Patient denies none.   Cardiovascular risk factors include none.  Use of agents associated with hypertension: none.   ---------------------------------------------------------------------- Still has cough productive of green sputum. Took course of doxycycline starting 05/07/2015 without improvement. Prescribed Levaquin 05/07/15, but only took one dose of antibiotic and stopped because it made his heart race. Cough is no better or worse now.   Follow up liver cirrhosis He was evaluated by Dr. Rogue Bussing in November for thrombocytopenia and found to have splenomegaly and cirrhotic changes of liver on CT scan. He was advised to have AFP checked every 6-12 months and referred to Dr. Candace Cruise for additional evaluation. He had negative hepatitis A, B, and C titers and had normal iron studies.    Allergies  Allergen Reactions  . Mucinex  [Guaifenesin Er]     Throat swelling, increases heart rate  . Levaquin [Levofloxacin] Palpitations   Previous Medications   ALBUTEROL (PROVENTIL HFA;VENTOLIN HFA) 108 (90 BASE) MCG/ACT INHALER    Inhale 2 puffs into the lungs every 4 (four) hours as needed for wheezing or shortness of breath.   ALLOPURINOL (ZYLOPRIM) 300 MG TABLET     Take 300 mg by mouth daily.   AMLODIPINE-BENAZEPRIL (LOTREL) 10-40 MG PER CAPSULE    Take 1 capsule by mouth daily.    ASCORBIC ACID (VITAMIN C) 1000 MG TABLET    Take 1,000 mg by mouth daily.   ASPIRIN 81 MG TABLET    Take 81 mg by mouth daily.   ATORVASTATIN (LIPITOR) 40 MG TABLET       B-D INS SYR ULTRAFINE 1CC/30G 30G X 1/2" 1 ML MISC       CHOLECALCIFEROL (VITAMIN D3 PO)    Take by mouth 2 (two) times daily.    CLOTRIMAZOLE-BETAMETHASONE (LOTRISONE) CREAM    Apply 1 application topically 2 (two) times daily.   CYANOCOBALAMIN (B-12 PO)    Take by mouth daily.   HUMULIN R 500 UNIT/ML SOLN INJECTION    Takes 40 units in the am and 60 units in the pm   HYDROCHLOROTHIAZIDE (HYDRODIURIL) 25 MG TABLET    Take 1 tablet (25 mg total) by mouth daily.   LEVOFLOXACIN (LEVAQUIN) 500 MG TABLET    Take 1 tablet (500 mg total) by mouth daily.   LEVOTHYROXINE (SYNTHROID, LEVOTHROID) 100 MCG TABLET    Take 1 tablet (100 mcg total) by mouth daily.   METFORMIN (GLUCOPHAGE) 1000 MG TABLET    TAKE ONE TABLET BY MOUTH TWICE DAILY   METOPROLOL (LOPRESSOR) 50 MG TABLET    Take 1 tablet (50 mg total) by mouth 2 (two) times daily.   MULTIPLE VITAMIN (MULTIVITAMIN) CAPSULE    Take 1  capsule by mouth daily.   OMEGA-3 FATTY ACIDS (FISH OIL PO)    Take by mouth daily.   ONE TOUCH ULTRA TEST TEST STRIP       PANTOPRAZOLE (PROTONIX) 40 MG TABLET       POTASSIUM PO    Take by mouth daily.   PREDNISONE (DELTASONE) 10 MG TABLET    Take 6 Tablets Day 1, 5 Tablets daily Day 2, 4 Tablets Day 3, 3 Tablets day 4, 2 Tablets Day 5, 1 Tablet Day 6    Review of Systems  Constitutional: Negative for fever, chills and appetite change.  Respiratory: Positive for shortness of breath. Negative for chest tightness and wheezing.   Cardiovascular: Negative for chest pain and palpitations.  Gastrointestinal: Negative for nausea, vomiting and abdominal pain.  Neurological: Positive for headaches.    Social History  Substance Use  Topics  . Smoking status: Former Smoker -- 1.00 packs/day for 15 years    Types: Cigarettes    Quit date: 05/03/1978  . Smokeless tobacco: Not on file  . Alcohol Use: No   Objective:   BP 150/84 mmHg  Pulse 60  Temp(Src) 98.8 F (37.1 C) (Oral)  Resp 16  Ht 5\' 11"  (1.803 m)  Wt 305 lb (138.347 kg)  BMI 42.56 kg/m2  SpO2 95%  Physical Exam   General Appearance:    Alert, cooperative, no distress  Eyes:    PERRL, conjunctiva/corneas clear, EOM's intact       Lungs:     Clear to auscultation bilaterally, respirations unlabored  Heart:    Regular rate and rhythm  Neurologic:   Awake, alert, oriented x 3. No apparent focal neurological           defect.           Assessment & Plan:     1. Essential hypertension Improved with addition of hctz which is refilled today.  - hydrochlorothiazide (HYDRODIURIL) 25 MG tablet; Take 1 tablet (25 mg total) by mouth daily.  Dispense: 30 tablet; Refill: 5  2. Liver disease Normal hepatitis A, B, and C screening, and normal iron studies. He is waiting to hear back from Dr. Candace Cruise regarding additional studies. Consider work up for other uncommon disorders.  3. Upper respiratory infection No improvement with doxycycline and intolerant to Levaquin. Start Amoxicllin 1000mg  BID.   Follow up Diabetes, htn and liver disease in 2 months.       Lelon Huh, MD  [facility]

## 2015-06-16 ENCOUNTER — Ambulatory Visit: Payer: BLUE CROSS/BLUE SHIELD | Admitting: Family Medicine

## 2015-07-01 ENCOUNTER — Ambulatory Visit: Payer: BLUE CROSS/BLUE SHIELD | Admitting: Podiatry

## 2015-07-08 ENCOUNTER — Ambulatory Visit (INDEPENDENT_AMBULATORY_CARE_PROVIDER_SITE_OTHER): Payer: BLUE CROSS/BLUE SHIELD

## 2015-07-08 ENCOUNTER — Encounter: Payer: Self-pay | Admitting: Podiatry

## 2015-07-08 ENCOUNTER — Ambulatory Visit (INDEPENDENT_AMBULATORY_CARE_PROVIDER_SITE_OTHER): Payer: BLUE CROSS/BLUE SHIELD | Admitting: Podiatry

## 2015-07-08 VITALS — BP 142/61 | HR 86 | Resp 18

## 2015-07-08 DIAGNOSIS — E1149 Type 2 diabetes mellitus with other diabetic neurological complication: Secondary | ICD-10-CM

## 2015-07-08 DIAGNOSIS — R52 Pain, unspecified: Secondary | ICD-10-CM | POA: Diagnosis not present

## 2015-07-08 DIAGNOSIS — M14671 Charcot's joint, right ankle and foot: Secondary | ICD-10-CM

## 2015-07-08 DIAGNOSIS — M722 Plantar fascial fibromatosis: Secondary | ICD-10-CM | POA: Diagnosis not present

## 2015-07-08 NOTE — Progress Notes (Signed)
   Subjective:    Patient ID: LARRELL FREDIANI, male    DOB: Feb 17, 1956, 60 y.o.   MRN: [id number]  HPI 60 year old male presents the office today for follow-up evaluation of right foot pain. He states that overall he is wearing the brace for any problems.The only thing  that he has noticed is he feels there is a knot on the bottom of the foot along the midfoot. Denies any recent increase in swelling or redness or warmth of the foot. No recent injury or trauma. No other complaints.   Review of Systems  All other systems reviewed and are negative.      Objective:   Physical Exam General: AAO x3, NAD  Dermatological: Skin is warm, dry and supple bilateral. Nails x 10 are well manicured; remaining integument appears unremarkable at this time. There are no open sores, no preulcerative lesions, no rash or signs of infection present.  Vascular: Dorsalis Pedis artery and Posterior Tibial artery pedal pulses are 2/4 bilateral with immedate capillary fill time. Pedal hair growth present.There is no significant edema to the feet. There is no pain with calf compression, swelling, warmth, erythema.   Neruologic: There is decrease in vibratory sensation decreased sensation with Derrel Nip monofilament.  Musculoskeletal: There is flatfoot deformity present for the right side was the left. The right foot is very somewhat wider than the left foot as well. There is no area pinpoint tenderness or pain the vibratory sensation bilaterally. On the plantar aspect of midfoot there does appear to be a small knot within the soft tissue in the medial aspect of the plantar midfoot. There is no erythema or increase in warmth. No fluctuance or crepitus. No signs of infection.  Gait: Unassisted, Nonantalgic.     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 -X-rays were obtained and reviewed with the patient. There does not appear to  be any significant change compared to prior x-rays. -Treatment options discussed including all alternatives, risks, and complications -Discussed steroid injection however given possible Charcot wish to hold off on that he does 2. I will order anti-inflammatory cream that he can massage the area and midfoot plantarly. If symptoms do not resolve we can consider steroid injection. -Continue with [place] brace for now -Follow-up in 3 months or sooner if needed or if symptoms worsen  Celesta Gentile, DPM

## 2015-07-10 ENCOUNTER — Encounter: Payer: Self-pay | Admitting: Podiatry

## 2015-07-14 ENCOUNTER — Ambulatory Visit (INDEPENDENT_AMBULATORY_CARE_PROVIDER_SITE_OTHER): Payer: BLUE CROSS/BLUE SHIELD | Admitting: Family Medicine

## 2015-07-14 ENCOUNTER — Encounter: Payer: Self-pay | Admitting: Family Medicine

## 2015-07-14 VITALS — BP 162/80 | HR 63 | Temp 98.4°F | Resp 18 | Wt 306.0 lb

## 2015-07-14 DIAGNOSIS — E039 Hypothyroidism, unspecified: Secondary | ICD-10-CM

## 2015-07-14 DIAGNOSIS — I10 Essential (primary) hypertension: Secondary | ICD-10-CM

## 2015-07-14 DIAGNOSIS — R0600 Dyspnea, unspecified: Secondary | ICD-10-CM

## 2015-07-14 DIAGNOSIS — R0609 Other forms of dyspnea: Secondary | ICD-10-CM | POA: Diagnosis not present

## 2015-07-14 DIAGNOSIS — E1140 Type 2 diabetes mellitus with diabetic neuropathy, unspecified: Secondary | ICD-10-CM | POA: Diagnosis not present

## 2015-07-14 DIAGNOSIS — D649 Anemia, unspecified: Secondary | ICD-10-CM

## 2015-07-14 DIAGNOSIS — K7469 Other cirrhosis of liver: Secondary | ICD-10-CM

## 2015-07-14 LAB — POCT GLYCOSYLATED HEMOGLOBIN (HGB A1C)
Est. average glucose Bld gHb Est-mCnc: 114
HEMOGLOBIN A1C: 5.6

## 2015-07-14 MED ORDER — ALBUTEROL SULFATE HFA 108 (90 BASE) MCG/ACT IN AERS: 2.0000 | INHALATION_SPRAY | RESPIRATORY_TRACT | Status: DC | PRN

## 2015-07-14 MED ORDER — INSULIN REGULAR HUMAN (CONC) 500 UNIT/ML ~~LOC~~ SOLN: SUBCUTANEOUS | Status: DC

## 2015-07-14 NOTE — Progress Notes (Signed)
Patient: Patrick Hurst Male    DOB: 1955-12-18   60 y.o.   MRN: [id number] Visit Date: 07/14/2015  Today's Provider: Lelon Huh, MD   Chief Complaint  Patient presents with  . Hypertension    follow up  . Diabetes    follow up  . Hypothyroidism    follow up   Subjective:    HPI  Diabetes Mellitus Type II, Follow-up:   Lab Results  Component Value Date   HGBA1C 5.7* 02/13/2015   HGBA1C 5.7 08/14/2014   HGBA1C 5.0 03/14/2012  .[facility]  Last seen for diabetes 5 months ago.  Management since then includes no changes. He reports good compliance with treatment. He is not having side effects.  Current symptoms include none and have been stable. Home blood sugar records: fasting range: 200's. In the evenings blood sugars run 170-180  Episodes of hypoglycemia? no   Current Insulin Regimen: Humulin 40 units in the mornings and 60 units in the evenings Most Recent Eye Exam: >1 year Weight trend: stable Prior visit with dietician: no Current diet: in general, a "healthy" diet   Current exercise: walking  Pertinent Labs:    Component Value Date/Time   CHOL 126 02/13/2015 0806   CHOL 138 10/16/2013   CHOL 180 03/14/2012 0321   TRIG 365* 02/13/2015 0806   TRIG 594* 03/14/2012 0321   HDL 26* 02/13/2015 0806   HDL 36 10/16/2013   HDL 21* 03/14/2012 0321   LDLCALC 27 02/13/2015 0806   LDLCALC 67 10/16/2013   LDLCALC SEE COMMENT 03/14/2012 0321   CREATININE 1.27 02/13/2015 0806   CREATININE 1.2 06/04/2014   CREATININE 0.95 05/04/2013 1240    Wt Readings from Last 3 Encounters:  05/14/15 305 lb (138.347 kg)  04/30/15 301 lb (136.533 kg)  03/20/15 301 lb 13 oz (136.9 kg)    ------------------------------------------------------------------------   Hypertension, follow-up:  BP Readings from Last 3 Encounters:  07/08/15 142/61  05/14/15 150/84  04/30/15 160/80    He was last seen for hypertension 2 months ago.  BP at that visit was  150/84. Management since that visit includes no changes. He reports good compliance with treatment. He is not having side effects.  He is exercising. He is adherent to low salt diet.   Outside blood pressures are [id number] systolic over [id number] diastolic. He is experiencing dyspnea and lower extremity edema.  Patient denies chest pain, chest pressure/discomfort, exertional chest pressure/discomfort, irregular heart beat and palpitations.   Cardiovascular risk factors include advanced age (older than 2 for men, 64 for women), diabetes mellitus, dyslipidemia, hypertension, male gender and obesity (BMI >= 30 kg/m2).  Use of agents associated with hypertension: NSAIDS.   Was started on Hctz in December which he is tolerating well.   Weight trend: stable Wt Readings from Last 3 Encounters:  05/14/15 305 lb (138.347 kg)  04/30/15 301 lb (136.533 kg)  03/20/15 301 lb 13 oz (136.9 kg)    Current diet: in general, a "healthy" diet    ------------------------------------------------------------------------  Follow up Hypothyroidism: Last office visit was 5 months ago and no changes were made. Patient reports good compliance with treatment.   Lab Results  Component Value Date   TSH 5.930* 02/13/2015       Allergies  Allergen Reactions  . Mucinex  [Guaifenesin Er]     Throat swelling, increases heart rate  . Levaquin [Levofloxacin] Palpitations   Previous Medications   ALBUTEROL (PROVENTIL HFA;VENTOLIN HFA) 108 (90 BASE) MCG/ACT  INHALER    Inhale 2 puffs into the lungs every 4 (four) hours as needed for wheezing or shortness of breath.   ALLOPURINOL (ZYLOPRIM) 300 MG TABLET    Take 300 mg by mouth daily.   AMLODIPINE-BENAZEPRIL (LOTREL) 10-40 MG PER CAPSULE    Take 1 capsule by mouth daily.    ASCORBIC ACID (VITAMIN C) 1000 MG TABLET    Take 1,000 mg by mouth daily.   ASPIRIN 81 MG TABLET    Take 81 mg by mouth daily.   ATORVASTATIN (LIPITOR) 40 MG TABLET       B-D INS SYR ULTRAFINE  1CC/30G 30G X 1/2" 1 ML MISC       CHOLECALCIFEROL (VITAMIN D3 PO)    Take by mouth 2 (two) times daily.    CLOTRIMAZOLE-BETAMETHASONE (LOTRISONE) CREAM    Apply 1 application topically 2 (two) times daily.   CYANOCOBALAMIN (B-12 PO)    Take by mouth daily.   HUMULIN R 500 UNIT/ML SOLN INJECTION    Takes 40 units in the am and 60 units in the pm   HYDROCHLOROTHIAZIDE (HYDRODIURIL) 25 MG TABLET    Take 1 tablet (25 mg total) by mouth daily.   LEVOTHYROXINE (SYNTHROID, LEVOTHROID) 100 MCG TABLET    Take 1 tablet (100 mcg total) by mouth daily.   METFORMIN (GLUCOPHAGE) 1000 MG TABLET    TAKE ONE TABLET BY MOUTH TWICE DAILY   METOPROLOL (LOPRESSOR) 50 MG TABLET    Take 1 tablet (50 mg total) by mouth 2 (two) times daily.   MULTIPLE VITAMIN (MULTIVITAMIN) CAPSULE    Take 1 capsule by mouth daily.   OMEGA-3 FATTY ACIDS (FISH OIL PO)    Take by mouth daily.   ONE TOUCH ULTRA TEST TEST STRIP       PANTOPRAZOLE (PROTONIX) 40 MG TABLET       POTASSIUM PO    Take by mouth daily.    Review of Systems  Constitutional: Negative for fever, chills and appetite change.  Respiratory: Positive for shortness of breath. Negative for chest tightness and wheezing.   Cardiovascular: Positive for leg swelling. Negative for chest pain and palpitations.  Gastrointestinal: Negative for nausea, vomiting and abdominal pain.  Endocrine: Negative for cold intolerance, heat intolerance, polydipsia, polyphagia and polyuria.    Social History  Substance Use Topics  . Smoking status: Former Smoker -- 1.00 packs/day for 15 years    Types: Cigarettes    Quit date: 05/03/1978  . Smokeless tobacco: Not on file  . Alcohol Use: No   Objective:   BP 162/80 mmHg  Pulse 63  Temp(Src) 98.4 F (36.9 C) (Oral)  Resp 18  Wt 306 lb (138.801 kg)  SpO2 93%  Physical Exam  General Appearance:    Alert, cooperative, no distress, obese  Eyes:    PERRL, conjunctiva/corneas clear, EOM's intact       Lungs:     Clear to  auscultation bilaterally, respirations unlabored  Heart:    Regular rate and rhythm  Neurologic:   Awake, alert, oriented x 3. No apparent focal neurological           defect.       Results for orders placed or performed in visit on 07/14/15  POCT HgB A1C  Result Value Ref Range   Hemoglobin A1C 5.6    Est. average glucose Bld gHb Est-mCnc 114        Assessment & Plan:      1. Type 2 diabetes, controlled, with  neuropathy ([place]) Well controlled. Continue current medications and insulin regiment - POCT HgB A1C - insulin regular human CONCENTRATED (HUMULIN R) 500 UNIT/ML injection; Takes 40 units in the am and 50 units in the pm  Dispense: 20 mL; Refill: 4  2. Hypothyroidism, unspecified hypothyroidism type  - TSH  3. Essential hypertension SBP still fairly elevated. Consider increasing hctz after reviewing labs.  - Renal function panel  4. Other cirrhosis of liver ([place]) Had initial evaluation by Dr. Candace Cruise in December and had negative viral hepatitis titers and  - Hepatic function panel - AFP tumor marker - Alpha-1-antitrypsin - ANA w/Reflex if Positive - Anti-smooth muscle antibody, IgG - Ceruloplasmin - Mitochondrial antibodies  5. Anemia, unspecified anemia type  - CBC  6. Dyspnea on exertion States he uses albuterol occasionally works well.  - albuterol (PROVENTIL HFA;VENTOLIN HFA) 108 (90 Base) MCG/ACT inhaler; Inhale 2 puffs into the lungs every 4 (four) hours as needed for wheezing or shortness of breath.  Dispense: 1 Inhaler; Refill: 2 - Brain natriuretic peptide     .armcapr  Lelon Huh, MD  [facility]

## 2015-07-15 LAB — RENAL FUNCTION PANEL
Albumin: 4 g/dL (ref 3.5–5.5)
BUN / CREAT RATIO: 18 (ref 9–20)
BUN: 27 mg/dL — AB (ref 6–24)
CO2: 24 mmol/L (ref 18–29)
CREATININE: 1.53 mg/dL — AB (ref 0.76–1.27)
Calcium: 8.5 mg/dL — ABNORMAL LOW (ref 8.7–10.2)
Chloride: 101 mmol/L (ref 96–106)
GFR calc Af Amer: 57 mL/min/{1.73_m2} — ABNORMAL LOW (ref >59)
GFR, EST NON AFRICAN AMERICAN: 49 mL/min/{1.73_m2} — AB (ref >59)
Glucose: 205 mg/dL — ABNORMAL HIGH (ref 65–99)
Phosphorus: 3.6 mg/dL (ref 2.5–4.5)
Potassium: 4.5 mmol/L (ref 3.5–5.2)
Sodium: 143 mmol/L (ref 134–144)

## 2015-07-15 LAB — ANA W/REFLEX IF POSITIVE
Anti JO-1: <0.2 AI (ref 0.0–0.9)
Anti Nuclear Antibody(ANA): POSITIVE — AB
ENA SSA (RO) Ab: <0.2 AI (ref 0.0–0.9)
ENA SSB (LA) Ab: <0.2 AI (ref 0.0–0.9)
Scleroderma SCL-70: <0.2 AI (ref 0.0–0.9)
dsDNA Ab: 18 IU/mL — ABNORMAL HIGH (ref 0–9)

## 2015-07-15 LAB — CBC
HEMOGLOBIN: 11 g/dL — AB (ref 12.6–17.7)
Hematocrit: 33.3 % — ABNORMAL LOW (ref 37.5–51.0)
MCH: 29.7 pg (ref 26.6–33.0)
MCHC: 33 g/dL (ref 31.5–35.7)
MCV: 90 fL (ref 79–97)
Platelets: 106 10*3/uL — ABNORMAL LOW (ref 150–379)
RBC: 3.7 x10E6/uL — AB (ref 4.14–5.80)
RDW: 15.6 % — ABNORMAL HIGH (ref 12.3–15.4)
WBC: 6.9 10*3/uL (ref 3.4–10.8)

## 2015-07-15 LAB — HEPATIC FUNCTION PANEL
ALK PHOS: 89 IU/L (ref 39–117)
ALT: 29 IU/L (ref 0–44)
AST: 20 IU/L (ref 0–40)
BILIRUBIN TOTAL: 0.5 mg/dL (ref 0.0–1.2)
BILIRUBIN, DIRECT: 0.14 mg/dL (ref 0.00–0.40)
TOTAL PROTEIN: 6.6 g/dL (ref 6.0–8.5)

## 2015-07-15 LAB — ALPHA-1-ANTITRYPSIN: A-1 Antitrypsin: 151 mg/dL (ref 90–200)

## 2015-07-15 LAB — MITOCHONDRIAL ANTIBODIES: Mitochondrial Ab: 7.7 Units (ref 0.0–20.0)

## 2015-07-15 LAB — ANTI-SMOOTH MUSCLE ANTIBODY, IGG: Smooth Muscle Ab: 11 Units (ref 0–19)

## 2015-07-15 LAB — TSH: TSH: 7.21 u[IU]/mL — ABNORMAL HIGH (ref 0.450–4.500)

## 2015-07-15 LAB — AFP TUMOR MARKER: AFP TUMOR MARKER: 1.8 ng/mL (ref 0.0–8.3)

## 2015-07-15 LAB — CERULOPLASMIN: CERULOPLASMIN: 27.5 mg/dL (ref 16.0–31.0)

## 2015-07-15 LAB — BRAIN NATRIURETIC PEPTIDE: BNP: 28.1 pg/mL (ref 0.0–100.0)

## 2015-07-21 ENCOUNTER — Telehealth: Payer: Self-pay | Admitting: *Deleted

## 2015-07-21 ENCOUNTER — Encounter: Payer: Self-pay | Admitting: Family Medicine

## 2015-07-21 DIAGNOSIS — R768 Other specified abnormal immunological findings in serum: Secondary | ICD-10-CM | POA: Insufficient documentation

## 2015-07-21 MED ORDER — HYDROCHLOROTHIAZIDE 50 MG PO TABS: 50.0000 mg | ORAL_TABLET | Freq: Every day | ORAL | Status: DC

## 2015-07-21 NOTE — Telephone Encounter (Signed)
Patient was notified of results. Patient expressed understanding. Rx was sent to pharmacy.

## 2015-07-21 NOTE — Telephone Encounter (Signed)
-----   Message from [name], MD sent at 07/21/2015 10:07 AM EDT ----- Platelets are still a little low, but improved. All liver function tests are completely normal. Kidney functions have declined a little bit. Need to drink more water.  Need to increase hctz to 50mg  daily to get better control of blood pressure, #30, rf x 2 Need to follow up in May as scheduled.

## 2015-07-23 ENCOUNTER — Other Ambulatory Visit: Payer: Self-pay | Admitting: Family Medicine

## 2015-07-29 NOTE — Telephone Encounter (Signed)
Please advise patient that we received refill request for atorvastatin, but I would like him to stop taking this for the time being. We need to order a follow up liver scan next time we see him and I want him to be off the atorvastatin to see if this has any effect on the liver scan.

## 2015-07-30 NOTE — Telephone Encounter (Signed)
Pt advised as directed below.   Thanks,   -Alleigh Mollica  

## 2015-08-21 ENCOUNTER — Other Ambulatory Visit: Payer: Self-pay | Admitting: Family Medicine

## 2015-08-21 DIAGNOSIS — E1140 Type 2 diabetes mellitus with diabetic neuropathy, unspecified: Secondary | ICD-10-CM

## 2015-08-21 NOTE — Telephone Encounter (Signed)
Pt stated that the last RX that was sent to [facility] was for one month supply and pt would like to get a 3 month supply b/c it's cheaper. Please advise. Thanks TNP

## 2015-08-22 MED ORDER — INSULIN REGULAR HUMAN (CONC) 500 UNIT/ML ~~LOC~~ SOLN: SUBCUTANEOUS | Status: DC

## 2015-08-23 ENCOUNTER — Other Ambulatory Visit: Payer: Self-pay | Admitting: Family Medicine

## 2015-08-26 DIAGNOSIS — G4733 Obstructive sleep apnea (adult) (pediatric): Secondary | ICD-10-CM | POA: Diagnosis not present

## 2015-08-27 ENCOUNTER — Telehealth: Payer: Self-pay | Admitting: Family Medicine

## 2015-08-27 DIAGNOSIS — E1140 Type 2 diabetes mellitus with diabetic neuropathy, unspecified: Secondary | ICD-10-CM

## 2015-08-27 MED ORDER — INSULIN REGULAR HUMAN (CONC) 500 UNIT/ML ~~LOC~~ SOLN: SUBCUTANEOUS | Status: DC

## 2015-08-27 NOTE — Telephone Encounter (Signed)
Pt's wife calling regarding his insulin humulinr.  He needs 1 vial until his mail order comes in .    Please send to  [address].  He has been out since Thursday last week.  Her call back number is [phone]  Thanks Con Memos

## 2015-08-28 ENCOUNTER — Other Ambulatory Visit: Payer: Self-pay | Admitting: Family Medicine

## 2015-08-28 DIAGNOSIS — E1140 Type 2 diabetes mellitus with diabetic neuropathy, unspecified: Secondary | ICD-10-CM

## 2015-08-28 MED ORDER — INSULIN REGULAR HUMAN (CONC) 500 UNIT/ML ~~LOC~~ SOLN: SUBCUTANEOUS | Status: DC

## 2015-09-17 ENCOUNTER — Inpatient Hospital Stay: Payer: BLUE CROSS/BLUE SHIELD

## 2015-09-17 ENCOUNTER — Inpatient Hospital Stay: Payer: BLUE CROSS/BLUE SHIELD | Admitting: Internal Medicine

## 2015-09-19 ENCOUNTER — Ambulatory Visit (INDEPENDENT_AMBULATORY_CARE_PROVIDER_SITE_OTHER): Payer: BLUE CROSS/BLUE SHIELD | Admitting: Family Medicine

## 2015-09-19 ENCOUNTER — Encounter: Payer: Self-pay | Admitting: Family Medicine

## 2015-09-19 VITALS — BP 130/70 | HR 56 | Temp 98.4°F | Resp 16 | Ht 71.0 in | Wt 301.0 lb

## 2015-09-19 DIAGNOSIS — D696 Thrombocytopenia, unspecified: Secondary | ICD-10-CM | POA: Diagnosis not present

## 2015-09-19 DIAGNOSIS — R05 Cough: Secondary | ICD-10-CM | POA: Diagnosis not present

## 2015-09-19 DIAGNOSIS — R160 Hepatomegaly, not elsewhere classified: Secondary | ICD-10-CM | POA: Diagnosis not present

## 2015-09-19 DIAGNOSIS — R161 Splenomegaly, not elsewhere classified: Secondary | ICD-10-CM | POA: Diagnosis not present

## 2015-09-19 DIAGNOSIS — R7689 Other specified abnormal immunological findings in serum: Secondary | ICD-10-CM

## 2015-09-19 DIAGNOSIS — R768 Other specified abnormal immunological findings in serum: Secondary | ICD-10-CM

## 2015-09-19 DIAGNOSIS — R059 Cough, unspecified: Secondary | ICD-10-CM

## 2015-09-19 MED ORDER — AZITHROMYCIN 250 MG PO TABS: ORAL_TABLET | ORAL | Status: AC

## 2015-09-19 NOTE — Progress Notes (Signed)
Patient: Patrick Hurst Male    DOB: Mar 05, 1956   60 y.o.   MRN: [id number] Visit Date: 09/19/2015  Today's Provider: Lelon Huh, MD   Chief Complaint  Patient presents with  . Follow-up  . Diabetes  . Hypothyroidism  . Hypertension  . Hyperlipidemia   Subjective:    HPI    Follow-up for hypothyroidism  from 07/14/2015; no changes, continue current medications.    Lab Results  Component Value Date   TSH 7.210* 07/14/2015     Diabetes Mellitus Type II, Follow-up:   Lab Results  Component Value Date   HGBA1C 5.6 07/14/2015   HGBA1C 5.7* 02/13/2015   HGBA1C 5.7 08/14/2014   Last seen for diabetes 2 months ago.  Management since then includes; no changes, continue current medications.  He reports good compliance with treatment. He is not having side effects. none Current symptoms include none and have been unchanged. Home blood sugar records: fasting range: 200  Episodes of hypoglycemia? no   Current Insulin Regimen: Humulin 40 units am and 50 pm Most Recent Eye Exam: due Weight trend: stable Prior visit with dietician: no Current diet: well balanced Current exercise: none  ----------------------------------------------------------------------   Hypertension, follow-up:  BP Readings from Last 3 Encounters:  09/19/15 130/70  07/14/15 162/80  07/08/15 142/61    He was last seen for hypertension 2 months ago.  BP at that visit was 162/80. Management since that visit includes; increased HCTZ to 50 mg qd to get better control of Bp. Recommend drinking more water to help improve kidney functions.He reports good compliance with treatment. He is not having side effects. none  He is not exercising. He is adherent to low salt diet.   Outside blood pressures are 160-70-90. He is experiencing none.  Patient denies none.   Cardiovascular risk factors include diabetes.  Use of agents associated with hypertension: none.    ----------------------------------------------------------------------    Lipid/Cholesterol, Follow-up:   Last seen for this 2 months ago.  Management since that visit includes; stopped atorvastatin for the time being due to findings consistent with cirrhosis on abdominal CT .  Last Lipid Panel:    Component Value Date/Time   CHOL 126 02/13/2015 0806   CHOL 138 10/16/2013   CHOL 180 03/14/2012 0321   TRIG 365* 02/13/2015 0806   TRIG 594* 03/14/2012 0321   HDL 26* 02/13/2015 0806   HDL 36 10/16/2013   HDL 21* 03/14/2012 0321   CHOLHDL 4.8 02/13/2015 0806   VLDL SEE COMMENT 03/14/2012 0321   LDLCALC 27 02/13/2015 0806   LDLCALC 67 10/16/2013   LDLCALC SEE COMMENT 03/14/2012 0321    He reports good compliance with treatment. He is not having side effects. none  Wt Readings from Last 3 Encounters:  09/19/15 301 lb (136.533 kg)  07/14/15 306 lb (138.801 kg)  05/14/15 305 lb (138.347 kg)    ----------------------------------------------------------------------  Productive cough for the last few days with sob. Been using inhalers 2-3 times a day for the last couple of days which helps quite a bit.   Allergies  Allergen Reactions  . Mucinex  [Guaifenesin Er]     Throat swelling, increases heart rate  . Levaquin [Levofloxacin] Palpitations   Previous Medications   ALBUTEROL (PROVENTIL HFA;VENTOLIN HFA) 108 (90 BASE) MCG/ACT INHALER    Inhale 2 puffs into the lungs every 4 (four) hours as needed for wheezing or shortness of breath.   ALLOPURINOL (ZYLOPRIM) 300 MG TABLET  Take 300 mg by mouth daily.   AMLODIPINE-BENAZEPRIL (LOTREL) 5-40 MG CAPSULE    TAKE 1 CAPSULE DAILY (CHANGED DOSE)   ASCORBIC ACID (VITAMIN C) 1000 MG TABLET    Take 1,000 mg by mouth daily.   ASPIRIN 81 MG TABLET    Take 81 mg by mouth daily.   ATORVASTATIN (LIPITOR) 40 MG TABLET       B-D INS SYR ULTRAFINE 1CC/30G 30G X 1/2" 1 ML MISC       CHOLECALCIFEROL (VITAMIN D3 PO)    Take by mouth 2 (two)  times daily.    CLOTRIMAZOLE-BETAMETHASONE (LOTRISONE) CREAM    Apply 1 application topically 2 (two) times daily.   CYANOCOBALAMIN (B-12 PO)    Take by mouth daily.   HYDROCHLOROTHIAZIDE (HYDRODIURIL) 50 MG TABLET    Take 1 tablet (50 mg total) by mouth daily.   INSULIN REGULAR HUMAN CONCENTRATED (HUMULIN R) 500 UNIT/ML INJECTION    Takes 40 units in the am and 50 units in the pm   LEVOTHYROXINE (SYNTHROID, LEVOTHROID) 100 MCG TABLET    Take 1 tablet (100 mcg total) by mouth daily.   METFORMIN (GLUCOPHAGE) 1000 MG TABLET    TAKE ONE TABLET BY MOUTH TWICE DAILY   METOPROLOL (LOPRESSOR) 50 MG TABLET    Take 1 tablet (50 mg total) by mouth 2 (two) times daily.   MULTIPLE VITAMIN (MULTIVITAMIN) CAPSULE    Take 1 capsule by mouth daily.   OMEGA-3 FATTY ACIDS (FISH OIL PO)    Take by mouth daily.   ONE TOUCH ULTRA TEST TEST STRIP       PANTOPRAZOLE (PROTONIX) 40 MG TABLET       POTASSIUM PO    Take by mouth daily.    Review of Systems  Constitutional: Negative for fever, chills and appetite change.  Respiratory: Positive for cough and shortness of breath. Negative for chest tightness and wheezing.   Cardiovascular: Negative for chest pain and palpitations.  Gastrointestinal: Negative for nausea, vomiting and abdominal pain.    Social History  Substance Use Topics  . Smoking status: Former Smoker -- 1.00 packs/day for 15 years    Types: Cigarettes    Quit date: 05/03/1978  . Smokeless tobacco: Not on file  . Alcohol Use: No   Objective:   BP 130/70 mmHg  Pulse 56  Temp(Src) 98.4 F (36.9 C) (Oral)  Resp 16  Ht '5\' 11"'  (1.803 m)  Wt 301 lb (136.533 kg)  BMI 42.00 kg/m2  SpO2 95%  Physical Exam   General Appearance:    Alert, cooperative, no distress  Eyes:    PERRL, conjunctiva/corneas clear, EOM's intact       Lungs:     Occasional expiratory wheeze, no rales, , respirations unlabored  Heart:    Regular rate and rhythm  Neurologic:   Awake, alert, oriented x 3. No apparent  focal neurological           defect.           Assessment & Plan:     1. Thrombocytopenia ([place]) Probably secondary to splenomegaly and hepatomegaly. He did have positive anti dsDNA on labs in March. Consider rheumatological evaluation if repeatedly positive.  - ANA - CBC - Sed Rate (ESR)  2. ANA positive  - ANA  3. Hepatomegaly CT findings consistent with cirrhosis. Has had once visit with Dr. Candace Cruise, but was never scheduled for follow up. Consider follow up with Dr. Allen Norris.  - US Abdomen Complete; Future  4.  Splenomegaly  - US Abdomen Complete; Future  5. Cough  - azithromycin (ZITHROMAX) 250 MG tablet; 2 by mouth today, then 1 daily for 4 days  Dispense: 6 tablet; Refill: 0       Lelon Huh, MD  [facility]

## 2015-09-20 ENCOUNTER — Other Ambulatory Visit: Payer: Self-pay | Admitting: Family Medicine

## 2015-09-20 LAB — CBC
HEMOGLOBIN: 11.9 g/dL — AB (ref 12.6–17.7)
Hematocrit: 35.9 % — ABNORMAL LOW (ref 37.5–51.0)
MCH: 30.4 pg (ref 26.6–33.0)
MCHC: 33.1 g/dL (ref 31.5–35.7)
MCV: 92 fL (ref 79–97)
PLATELETS: 122 10*3/uL — AB (ref 150–379)
RBC: 3.91 x10E6/uL — AB (ref 4.14–5.80)
RDW: 15 % (ref 12.3–15.4)
WBC: 6.9 10*3/uL (ref 3.4–10.8)

## 2015-09-20 LAB — ANA: ANA: POSITIVE — AB

## 2015-09-20 LAB — SEDIMENTATION RATE: SED RATE: 24 mm/h (ref 0–30)

## 2015-09-22 ENCOUNTER — Other Ambulatory Visit: Payer: Self-pay

## 2015-09-22 DIAGNOSIS — R768 Other specified abnormal immunological findings in serum: Secondary | ICD-10-CM

## 2015-09-22 NOTE — Progress Notes (Signed)
Patient advised and order for referral put in chart and sent to referrals

## 2015-09-24 ENCOUNTER — Ambulatory Visit
Admission: RE | Admit: 2015-09-24 | Discharge: 2015-09-24 | Disposition: A | Discharge status: Home / Self Care | Payer: BLUE CROSS/BLUE SHIELD | Source: Ambulatory Visit | Attending: Family Medicine | Admitting: Family Medicine

## 2015-09-24 ENCOUNTER — Telehealth: Payer: Self-pay | Admitting: *Deleted

## 2015-09-24 DIAGNOSIS — K7460 Unspecified cirrhosis of liver: Secondary | ICD-10-CM

## 2015-09-24 DIAGNOSIS — R160 Hepatomegaly, not elsewhere classified: Secondary | ICD-10-CM

## 2015-09-24 DIAGNOSIS — R161 Splenomegaly, not elsewhere classified: Secondary | ICD-10-CM

## 2015-09-24 DIAGNOSIS — R162 Hepatomegaly with splenomegaly, not elsewhere classified: Secondary | ICD-10-CM | POA: Insufficient documentation

## 2015-09-24 MED ORDER — COLESTIPOL HCL 1 G PO TABS: 2.0000 g | ORAL_TABLET | Freq: Two times a day (BID) | ORAL | Status: DC

## 2015-09-24 NOTE — Telephone Encounter (Signed)
Patient was notified of results. Patient expressed understanding. Rx sent to pharmacy.  

## 2015-09-24 NOTE — Telephone Encounter (Signed)
-----   Message from [name], MD sent at 09/24/2015 11:31 AM EDT ----- Ultrasound shows signs of liver cirrhosis, consistent with CT scan done last year. He needs referral to see Dr. Allen Norris who is a liver specialist. He should stay off of atorvastatin for now. Instead take colestipol 1 gram tablet, 2 tablets BID with meals. #120, rf x 3 for cholesterol.

## 2015-09-24 NOTE — Telephone Encounter (Signed)
Patient is agreeable to referral to see Dr. Allen Norris.

## 2015-09-25 DIAGNOSIS — G4733 Obstructive sleep apnea (adult) (pediatric): Secondary | ICD-10-CM | POA: Diagnosis not present

## 2015-10-01 DIAGNOSIS — D696 Thrombocytopenia, unspecified: Secondary | ICD-10-CM | POA: Diagnosis not present

## 2015-10-01 DIAGNOSIS — R768 Other specified abnormal immunological findings in serum: Secondary | ICD-10-CM | POA: Diagnosis not present

## 2015-10-01 DIAGNOSIS — E039 Hypothyroidism, unspecified: Secondary | ICD-10-CM | POA: Diagnosis not present

## 2015-10-01 DIAGNOSIS — M170 Bilateral primary osteoarthritis of knee: Secondary | ICD-10-CM | POA: Insufficient documentation

## 2015-10-06 ENCOUNTER — Other Ambulatory Visit: Payer: BLUE CROSS/BLUE SHIELD

## 2015-10-06 ENCOUNTER — Ambulatory Visit: Payer: BLUE CROSS/BLUE SHIELD | Admitting: Internal Medicine

## 2015-10-09 ENCOUNTER — Ambulatory Visit (INDEPENDENT_AMBULATORY_CARE_PROVIDER_SITE_OTHER): Payer: BLUE CROSS/BLUE SHIELD

## 2015-10-09 ENCOUNTER — Ambulatory Visit (INDEPENDENT_AMBULATORY_CARE_PROVIDER_SITE_OTHER): Payer: BLUE CROSS/BLUE SHIELD | Admitting: Podiatry

## 2015-10-09 ENCOUNTER — Encounter: Payer: Self-pay | Admitting: Podiatry

## 2015-10-09 DIAGNOSIS — R52 Pain, unspecified: Secondary | ICD-10-CM

## 2015-10-09 DIAGNOSIS — M14671 Charcot's joint, right ankle and foot: Secondary | ICD-10-CM

## 2015-10-09 DIAGNOSIS — M722 Plantar fascial fibromatosis: Secondary | ICD-10-CM

## 2015-10-09 DIAGNOSIS — E1149 Type 2 diabetes mellitus with other diabetic neurological complication: Secondary | ICD-10-CM

## 2015-10-09 NOTE — Progress Notes (Signed)
Patient ID: Patrick Hurst, male   DOB: April 14, 1956, 60 y.o.   MRN: [id number]  Subjective: 60 year old male presents the office today for follow-up evaluation of right foot pain. He states he does still get pain to the midfoot, but the brace has been helping. No increase in swelling or redness. No recent injury or trauma. He has been recently diagnosed with RA, has not started any medication. Also diagnosed with cirrhosis of the liver.  No other complaints.  Objective: General: AAO x3, NAD  Dermatological: Skin is warm, dry and supple bilateral. There are no open sores, no preulcerative lesions, no rash or signs of infection present.  Vascular: Dorsalis Pedis artery and Posterior Tibial artery pedal pulses are 2/4 bilateral with immedate capillary fill time. Pedal hair growth present. There is no pain with calf compression, swelling, warmth, erythema.   Neruologic: There is decrease in vibratory sensation decreased sensation with Derrel Nip monofilament.  Musculoskeletal: There is flatfoot deformity present for the right side was the left. Right foot is wider than the left foot as well. There is some mild chronic edema to the right foot. There is tenderness on the medial band plantar fascia proximal to the sesamoids. There is no pain along the insertion of the heel. Is no pain with meals lateral compression to the calcaneus or the other areas of the foot. There is no erythema or increase in warmth. No other areas of tenderness at this time.   Gait: Unassisted, Nonantalgic.   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  Plan:  -X-rays were obtained and reviewed with the patient. Significant arthritic changes of the midfoot. No evidence of acute fracture.  -Treatment options discussed including all alternatives, risks, and complications -At this time he is requesting steroid injection given the  ongoing pain. Discussed risks, occasions understands this. Under sterile conditions a mixture of Kenalog and local anesthetic was infiltrated into the area of maximal tenderness without complications. Post injection care was discussed. Monitor blood sugar. -Ice to the area. -Continue [place] brace. -Follow-up in 3 months or sooner if needed.  Celesta Gentile, DPM

## 2015-10-20 ENCOUNTER — Other Ambulatory Visit: Payer: Self-pay | Admitting: Family Medicine

## 2015-10-22 ENCOUNTER — Encounter: Payer: Self-pay | Admitting: Family Medicine

## 2015-10-22 ENCOUNTER — Other Ambulatory Visit: Payer: Self-pay

## 2015-10-22 ENCOUNTER — Ambulatory Visit (INDEPENDENT_AMBULATORY_CARE_PROVIDER_SITE_OTHER): Payer: BLUE CROSS/BLUE SHIELD | Admitting: Family Medicine

## 2015-10-22 VITALS — BP 150/60 | HR 66 | Temp 98.8°F | Resp 18 | Wt 310.0 lb

## 2015-10-22 DIAGNOSIS — J069 Acute upper respiratory infection, unspecified: Secondary | ICD-10-CM

## 2015-10-22 MED ORDER — AMOXICILLIN 500 MG PO CAPS: 1000.0000 mg | ORAL_CAPSULE | Freq: Two times a day (BID) | ORAL | Status: AC

## 2015-10-22 NOTE — Progress Notes (Signed)
Patient: Patrick Hurst Male    DOB: 02-12-1956   60 y.o.   MRN: [id number] Visit Date: 10/22/2015  Today's Provider: Lelon Huh, MD   Chief Complaint  Patient presents with  . Cough   Subjective:    Cough This is a new problem. Episode onset: 2 weeks ago. The problem has been rapidly worsening. The cough is productive of sputum (green colored sputum). Associated symptoms include ear congestion, myalgias, nasal congestion, postnasal drip, rhinorrhea, a sore throat, shortness of breath and sweats. Pertinent negatives include no chest pain, chills, ear pain, eye redness, fever, headaches, hemoptysis or wheezing. The symptoms are aggravated by cold air. Treatments tried: Inhalers. The treatment provided no relief.   He had similar symptoms about a month which cleared up while he was taking azithromycin, but symptoms returned about a week later.     Allergies  Allergen Reactions  . Mucinex  [Guaifenesin Er]     Throat swelling, increases heart rate  . Levaquin [Levofloxacin] Palpitations   Current Meds  Medication Sig  . albuterol (PROVENTIL HFA;VENTOLIN HFA) 108 (90 Base) MCG/ACT inhaler Inhale 2 puffs into the lungs every 4 (four) hours as needed for wheezing or shortness of breath.  Marland Kitchen amLODipine-benazepril (LOTREL) 5-40 MG capsule TAKE 1 CAPSULE DAILY (CHANGED DOSE)  . Ascorbic Acid (VITAMIN C) 1000 MG tablet Take 1,000 mg by mouth daily.  Marland Kitchen aspirin 81 MG tablet Take 81 mg by mouth daily.  . B-D INS SYR ULTRAFINE 1CC/30G 30G X 1/2" 1 ML MISC   . Calcium Carbonate (CALCIUM 600 PO) Take 1 tablet by mouth daily.  . Chlorpheniramine Maleate (ALLERGY RELIEF PO) Take 1 tablet by mouth daily.  . Cholecalciferol (VITAMIN D3 PO) Take by mouth 2 (two) times daily.   . clotrimazole-betamethasone (LOTRISONE) cream Apply 1 application topically 2 (two) times daily.  . colestipol (COLESTID) 1 g tablet Take 2 tablets (2 g total) by mouth 2 (two) times daily.  . Cyanocobalamin (B-12 PO)  Take by mouth daily.  . ferrous sulfate 325 (65 FE) MG tablet Take 325 mg by mouth daily with breakfast.  . hydrochlorothiazide (HYDRODIURIL) 50 MG tablet TAKE ONE TABLET BY MOUTH ONCE DAILY  . insulin regular human CONCENTRATED (HUMULIN R) 500 UNIT/ML injection Takes 40 units in the am and 50 units in the pm  . levothyroxine (SYNTHROID, LEVOTHROID) 100 MCG tablet Take 1 tablet (100 mcg total) by mouth daily. (Patient taking differently: Take 125 mcg by mouth daily. )  . metFORMIN (GLUCOPHAGE) 1000 MG tablet TAKE ONE TABLET BY MOUTH TWICE DAILY  . metoprolol (LOPRESSOR) 50 MG tablet Take 1 tablet (50 mg total) by mouth 2 (two) times daily.  . Multiple Vitamin (MULTIVITAMIN) capsule Take 1 capsule by mouth daily.  . Omega-3 Fatty Acids (FISH OIL PO) Take by mouth daily.  . ONE TOUCH ULTRA TEST test strip USE 1 STRIP TWICE A DAY  . pantoprazole (PROTONIX) 40 MG tablet TAKE 1 TABLET TWICE A DAY  . POTASSIUM PO Take by mouth daily.    Review of Systems  Constitutional: Positive for diaphoresis and fatigue. Negative for fever and chills.  HENT: Positive for congestion, postnasal drip, rhinorrhea and sore throat. Negative for ear pain, sinus pressure and sneezing.   Eyes: Positive for discharge (watery eyes). Negative for photophobia, pain, redness and itching.  Respiratory: Positive for cough and shortness of breath. Negative for hemoptysis and wheezing.   Cardiovascular: Negative for chest pain.  Musculoskeletal: Positive for myalgias.  Neurological: Negative for dizziness, light-headedness and headaches.    Social History  Substance Use Topics  . Smoking status: Former Smoker -- 1.00 packs/day for 15 years    Types: Cigarettes    Quit date: 05/03/1978  . Smokeless tobacco: Not on file  . Alcohol Use: No   Objective:   BP 150/60 mmHg  Pulse 66  Temp(Src) 98.8 F (37.1 C) (Oral)  Resp 18  Wt 310 lb (140.615 kg)  SpO2 94%  Physical Exam  General Appearance:    Alert, cooperative,  no distress  HENT:   bilateral TM normal without fluid or infection, neck without nodes, pharynx erythematous without exudate, sinuses nontender and nasal mucosa congested  Eyes:    PERRL, conjunctiva/corneas clear, EOM's intact       Lungs:     Occasional scattered wheeze, no rales. Respirations unlabored  Heart:    Regular rate and rhythm  Neurologic:   Awake, alert, oriented x 3. No apparent focal neurological           defect.           Assessment & Plan:     1. Upper respiratory infection  - amoxicillin (AMOXIL) 500 MG capsule; Take 2 capsules (1,000 mg total) by mouth 2 (two) times daily.  Dispense: 40 capsule; Refill: 0   Call if symptoms change or if not rapidly improving.    The entirety of the information documented in the History of Present Illness, Review of Systems and Physical Exam were personally obtained by me. Portions of this information were initially documented by Meyer Cory, CMA and reviewed by me for thoroughness and accuracy.       Lelon Huh, MD  [facility]

## 2015-10-23 ENCOUNTER — Encounter: Payer: Self-pay | Admitting: Gastroenterology

## 2015-10-23 ENCOUNTER — Ambulatory Visit (INDEPENDENT_AMBULATORY_CARE_PROVIDER_SITE_OTHER): Payer: BLUE CROSS/BLUE SHIELD | Admitting: Gastroenterology

## 2015-10-23 VITALS — BP 179/72 | HR 67 | Temp 98.4°F | Ht 70.0 in | Wt 306.0 lb

## 2015-10-23 DIAGNOSIS — K7460 Unspecified cirrhosis of liver: Secondary | ICD-10-CM

## 2015-10-23 NOTE — Progress Notes (Addendum)
Gastroenterology Consultation  Referring Provider:     [name], MD Primary Care Physician:  Lelon Huh, MD Primary Gastroenterologist:  Dr. Allen Norris     Reason for Consultation:     Cirrhosis        HPI:   Patrick Hurst is a 60 y.o. y/o male referred for consultation & management of Cirrhosis by Dr. Lelon Huh, MD.  This patient comes in today after having a workup by Dr. Candace Cruise for a nodular liver.  The patient was found during the workup to have a positive ANA area the patient was sent to rheumatology who suggested a slew of possible diagnoses that could cause the patient to have a positive AMA. The patient denies any alcohol abuse. He also denies any history of abnormal liver enzymes although his liver enzymes a few years ago were consistently elevated. He denies any abdominal pain fevers chills black stools or bloody stools. The patient also reports that he has had upper endoscopies and colonoscopies by Dr. Candace Cruise in the past. He is somewhat disturbed by poor communication about what his problem is and a cause of it. His most recent liver enzymes were actually normal. He denies ever being jaundiced. He also denies any IV drug use or high risk activity in the past. The patient had a hepatitis B and C sent off that were negative. His also not immune to hepatitis B.   Past Medical History  Diagnosis Date  . Diabetes ([place])   . Gout   . Thyroid disease   . GERD (gastroesophageal reflux disease)   . Vitamin D deficiency   . History of chicken pox   . Anemia   . Hyperlipidemia   . Depression   . OSA (obstructive sleep apnea)   . Bell's palsy   . Hypertension   . Allergy   . Gastric ulcer     Past Surgical History  Procedure Laterality Date  . Gallbladder surgery    . Shoulder surgery Right 2012    Dr. Leanor Kail  . Cholecystectomy  04/28/2011    Laproscopic; Dr. Pat Patrick  . Cataract extraction Bilateral 2014 and 2015    Prior to Admission medications   Medication Sig  Start Date End Date Taking? Authorizing Provider  albuterol (PROVENTIL HFA;VENTOLIN HFA) 108 (90 Base) MCG/ACT inhaler Inhale 2 puffs into the lungs every 4 (four) hours as needed for wheezing or shortness of breath. 07/14/15  Yes [name], MD  allopurinol (ZYLOPRIM) 300 MG tablet Take 300 mg by mouth daily. Reported on 10/22/2015   Yes Historical Provider, MD  amLODipine-benazepril (LOTREL) 5-40 MG capsule TAKE 1 CAPSULE DAILY (CHANGED DOSE) 08/23/15  Yes [name], MD  amoxicillin (AMOXIL) 500 MG capsule Take 2 capsules (1,000 mg total) by mouth 2 (two) times daily. 10/22/15 11/01/15 Yes [name], MD  Ascorbic Acid (VITAMIN C) 1000 MG tablet Take 1,000 mg by mouth daily.   Yes Historical Provider, MD  aspirin 81 MG tablet Take 81 mg by mouth daily.   Yes Historical Provider, MD  B-D INS SYR ULTRAFINE 1CC/30G 30G X 1/2" 1 ML MISC  09/04/13  Yes Historical Provider, MD  Calcium Carbonate (CALCIUM 600 PO) Take 1 tablet by mouth daily.   Yes Historical Provider, MD  Chlorpheniramine Maleate (ALLERGY RELIEF PO) Take 1 tablet by mouth daily.   Yes Historical Provider, MD  Cholecalciferol (VITAMIN D3 PO) Take by mouth 2 (two) times daily.    Yes Historical Provider, MD  clotrimazole-betamethasone (LOTRISONE) cream Apply 1 application topically 2 (two) times daily. 04/16/15  Yes [name], MD  colestipol (COLESTID) 1 g tablet Take 2 tablets (2 g total) by mouth 2 (two) times daily. 09/24/15  Yes [name], MD  Cyanocobalamin (B-12 PO) Take by mouth daily.   Yes Historical Provider, MD  ferrous sulfate 325 (65 FE) MG tablet Take 325 mg by mouth daily with breakfast.   Yes Historical Provider, MD  hydrochlorothiazide (HYDRODIURIL) 50 MG tablet TAKE ONE TABLET BY MOUTH ONCE DAILY 10/20/15  Yes [name], MD  insulin regular human CONCENTRATED (HUMULIN R) 500 UNIT/ML injection Takes 40 units in the am and 50 units in the pm 08/28/15  Yes [name], MD  levothyroxine  (SYNTHROID, LEVOTHROID) 100 MCG tablet Take 1 tablet (100 mcg total) by mouth daily. Patient taking differently: Take 125 mcg by mouth daily.  02/20/15  Yes [name], MD  metFORMIN (GLUCOPHAGE) 1000 MG tablet TAKE ONE TABLET BY MOUTH TWICE DAILY 02/14/15  Yes [name], MD  metoprolol (LOPRESSOR) 50 MG tablet Take 1 tablet (50 mg total) by mouth 2 (two) times daily. 01/10/15  Yes [name], MD  Multiple Vitamin (MULTIVITAMIN) capsule Take 1 capsule by mouth daily.   Yes Historical Provider, MD  Omega-3 Fatty Acids (FISH OIL PO) Take by mouth daily.   Yes Historical Provider, MD  ONE TOUCH ULTRA TEST test strip USE 1 STRIP TWICE A DAY 09/20/15  Yes [name], MD  pantoprazole (PROTONIX) 40 MG tablet TAKE 1 TABLET TWICE A DAY 09/20/15  Yes [name], MD  POTASSIUM PO Take by mouth daily.   Yes Historical Provider, MD    Family History  Problem Relation Age of Onset  . COPD Mother   . Heart disease Father      Social History  Substance Use Topics  . Smoking status: Former Smoker -- 1.00 packs/day for 15 years    Types: Cigarettes    Quit date: 05/03/1978  . Smokeless tobacco: Former Systems developer  . Alcohol Use: No    Allergies as of 10/23/2015 - Review Complete 10/23/2015  Allergen Reaction Noted  . Mucinex  [guaifenesin er]  02/12/2015  . Levaquin [levofloxacin] Palpitations 05/14/2015    Review of Systems:    All systems reviewed and negative except where noted in HPI.   Physical Exam:  BP 179/72 mmHg  Pulse 67  Temp(Src) 98.4 F (36.9 C) (Oral)  Ht 5\' 10"  (1.778 m)  Wt 306 lb (138.801 kg)  BMI 43.91 kg/m2 No LMP for male patient. Psych:  Alert and cooperative. Normal mood and affect. General:   Alert,  Well-developed, Obese, well-nourished, pleasant and cooperative in NAD Head:  Normocephalic and atraumatic. Eyes:  Sclera clear, no icterus.   Conjunctiva pink. Ears:  Normal auditory acuity. Nose:  No deformity, discharge, or lesions. Mouth:  No  deformity or lesions,oropharynx pink & moist. Neck:  Supple; no masses or thyromegaly. Lungs:  Respirations even and unlabored.  Clear throughout to auscultation.   No wheezes, crackles, or rhonchi. No acute distress. Heart:  Regular rate and rhythm; no murmurs, clicks, rubs, or gallops. Abdomen:  Normal bowel sounds.  No bruits.  Soft, non-tender and non-distended without masses, hepatosplenomegaly or hernias noted.  No guarding or rebound tenderness.  Negative Carnett sign.   Rectal:  Deferred.  Msk:  Symmetrical without gross deformities.  Good, equal movement & strength bilaterally. Pulses:  Normal pulses noted. Extremities:  No clubbing or edema.  No cyanosis. Neurologic:  Alert and oriented x3;  grossly normal neurologically. Skin:  Intact without significant lesions or rashes.  No jaundice. Lymph Nodes:  No significant cervical adenopathy. Psych:  Alert and cooperative. Normal mood and affect.  Imaging Studies: US Abdomen Complete  09/24/2015  CLINICAL DATA:  Hepatomegaly and splenomegaly history of cirrhosis EXAM: ABDOMEN ULTRASOUND COMPLETE COMPARISON:  CT abdomen and pelvis of 03/13/2015 FINDINGS: Gallbladder: The gallbladder has previously been resected. Common bile duct: Diameter: The common bile duct is normal measuring 5.0 mm in diameter. Liver: The liver is extremely echogenic and difficult to penetrate. This is consistent with cirrhosis as noted on prior CT. No focal hepatic abnormality is seen. IVC: No abnormality visualized. Pancreas: Pancreas is largely obscured by bowel gas. Spleen: The spleen is enlarged measuring 17.5 cm with volume of 970 cubic cm. Right Kidney: Length: 12.3 cm.  No hydronephrosis is seen. Left Kidney: Length: 13.0 cm.  No hydronephrosis is noted. Abdominal aorta: The abdominal aorta is largely obscured by bowel gas and body habitus. Other findings: None. IMPRESSION: 1. Extremely echogenic liver parenchyma consistent with changes of cirrhosis as noted on prior  CT. No focal hepatic abnormality is seen. 2. Splenomegaly. 3. No hydronephrosis. 4. The pancreas is obscured by bowel gas. Electronically Signed   By: Ivar Drape M.D.   On: 09/24/2015 10:29    Assessment and Plan:   Patrick Hurst is a 60 y.o. y/o male who comes in today with a history of a nodular liver consistent with cirrhosis and splenomegaly. 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 The patient's most likely cause is either from nonalcoholic fatty liver disease versus autoimmune hepatitis. The patient will have his ferritin checked today in addition to his liver enzymes. The patient will also be set up for a liver biopsy to rule out autoimmune hepatitis versus fatty liver is the cause of his cirrhosis. Patient has very had an alpha-fetoprotein sent off it was negative. The patient has been told the plan and agrees with it.   Note: This dictation was prepared with Dragon dictation along with smaller phrase technology. Any transcriptional errors that result from this process are unintentional.

## 2015-10-24 LAB — CBC WITH DIFFERENTIAL/PLATELET
BASOS ABS: 0.1 10*3/uL (ref 0.0–0.2)
Basos: 1 %
EOS (ABSOLUTE): 0.2 10*3/uL (ref 0.0–0.4)
Eos: 4 %
Hematocrit: 32.5 % — ABNORMAL LOW (ref 37.5–51.0)
Hemoglobin: 11 g/dL — ABNORMAL LOW (ref 12.6–17.7)
Immature Grans (Abs): 0.1 10*3/uL (ref 0.0–0.1)
Immature Granulocytes: 1 %
LYMPHS ABS: 1.6 10*3/uL (ref 0.7–3.1)
Lymphs: 27 %
MCH: 31.2 pg (ref 26.6–33.0)
MCHC: 33.8 g/dL (ref 31.5–35.7)
MCV: 92 fL (ref 79–97)
MONOS ABS: 0.4 10*3/uL (ref 0.1–0.9)
Monocytes: 7 %
NEUTROS ABS: 3.7 10*3/uL (ref 1.4–7.0)
Neutrophils: 60 %
PLATELETS: 106 10*3/uL — AB (ref 150–379)
RBC: 3.53 x10E6/uL — ABNORMAL LOW (ref 4.14–5.80)
RDW: 16.2 % — ABNORMAL HIGH (ref 12.3–15.4)
WBC: 5.9 10*3/uL (ref 3.4–10.8)

## 2015-10-24 LAB — FERRITIN: FERRITIN: 200 ng/mL (ref 30–400)

## 2015-10-24 LAB — HEPATITIS A ANTIBODY, TOTAL: HEP A TOTAL AB: POSITIVE — AB

## 2015-10-26 DIAGNOSIS — G4733 Obstructive sleep apnea (adult) (pediatric): Secondary | ICD-10-CM | POA: Diagnosis not present

## 2015-10-27 ENCOUNTER — Other Ambulatory Visit: Payer: Self-pay

## 2015-10-28 ENCOUNTER — Other Ambulatory Visit: Payer: Self-pay

## 2015-10-28 DIAGNOSIS — K7460 Unspecified cirrhosis of liver: Secondary | ICD-10-CM

## 2015-10-28 DIAGNOSIS — R188 Other ascites: Principal | ICD-10-CM

## 2015-10-30 ENCOUNTER — Telehealth: Payer: Self-pay | Admitting: Gastroenterology

## 2015-10-30 NOTE — Telephone Encounter (Signed)
Results available for Dr. Allen Norris to review. Will contact pt as soon as I receive.

## 2015-10-30 NOTE — Telephone Encounter (Signed)
Waiting on results from blood work

## 2015-11-03 NOTE — Telephone Encounter (Signed)
Pt scheduled for Transjugular liver biopsy at [facility] on Friday, July 14th @ 9:00am. Pt informed to arrive at 8:00am and to be NPO after midnight.

## 2015-11-05 ENCOUNTER — Telehealth: Payer: Self-pay

## 2015-11-05 NOTE — Telephone Encounter (Signed)
-----   Message from Lucilla Lame, MD sent at 11/04/2015  8:45 AM EDT ----- The patient know that his blood work showed him to have low platelets which is consistent with his liver disease but the final determination of a possible cause of his liver disease will rest upon the liver biopsy.

## 2015-11-05 NOTE — Telephone Encounter (Signed)
Pt notified of lab results

## 2015-11-10 ENCOUNTER — Other Ambulatory Visit: Payer: Self-pay | Admitting: Radiology

## 2015-11-11 ENCOUNTER — Other Ambulatory Visit: Payer: Self-pay | Admitting: General Surgery

## 2015-11-14 ENCOUNTER — Ambulatory Visit
Admission: RE | Admit: 2015-11-14 | Discharge: 2015-11-14 | Disposition: A | Discharge status: Home / Self Care | Payer: BLUE CROSS/BLUE SHIELD | Source: Ambulatory Visit | Attending: Gastroenterology | Admitting: Gastroenterology

## 2015-11-14 DIAGNOSIS — K7460 Unspecified cirrhosis of liver: Secondary | ICD-10-CM

## 2015-11-14 DIAGNOSIS — K7581 Nonalcoholic steatohepatitis (NASH): Secondary | ICD-10-CM | POA: Diagnosis not present

## 2015-11-14 DIAGNOSIS — R7989 Other specified abnormal findings of blood chemistry: Secondary | ICD-10-CM | POA: Diagnosis not present

## 2015-11-14 DIAGNOSIS — R748 Abnormal levels of other serum enzymes: Secondary | ICD-10-CM | POA: Diagnosis present

## 2015-11-14 DIAGNOSIS — R188 Other ascites: Secondary | ICD-10-CM

## 2015-11-14 HISTORY — DX: Hypothyroidism, unspecified: E03.9

## 2015-11-14 HISTORY — DX: Unspecified osteoarthritis, unspecified site: M19.90

## 2015-11-14 LAB — CBC
HCT: 32.9 % — ABNORMAL LOW (ref 40.0–52.0)
Hemoglobin: 11.8 g/dL — ABNORMAL LOW (ref 13.0–18.0)
MCH: 32.9 pg (ref 26.0–34.0)
MCHC: 35.8 g/dL (ref 32.0–36.0)
MCV: 92 fL (ref 80.0–100.0)
PLATELETS: 95 10*3/uL — AB (ref 150–440)
RBC: 3.58 MIL/uL — ABNORMAL LOW (ref 4.40–5.90)
RDW: 15.7 % — AB (ref 11.5–14.5)
WBC: 5.3 10*3/uL (ref 3.8–10.6)

## 2015-11-14 LAB — PROTIME-INR
INR: 0.93
Prothrombin Time: 12.7 seconds (ref 11.4–15.0)

## 2015-11-14 LAB — APTT: APTT: 32 s (ref 24–36)

## 2015-11-14 MED ORDER — MIDAZOLAM HCL 2 MG/2ML IJ SOLN
INTRAMUSCULAR | Status: AC | PRN
  Administered 2015-11-14 (×2): 1 mg via INTRAVENOUS

## 2015-11-14 MED ORDER — FENTANYL CITRATE (PF) 100 MCG/2ML IJ SOLN
INTRAMUSCULAR | Status: AC | PRN
  Administered 2015-11-14 (×2): 50 ug via INTRAVENOUS

## 2015-11-14 MED ORDER — SODIUM CHLORIDE 0.9 % IV SOLN
INTRAVENOUS | Status: DC
  Administered 2015-11-14: 09:00:00 via INTRAVENOUS

## 2015-11-14 NOTE — Discharge Instructions (Signed)
Liver Biopsy, Care After °Refer to this sheet in the next few weeks. These instructions provide you with information on caring for yourself after your procedure. Your health care provider may also give you more specific instructions. Your treatment has been planned according to current medical practices, but problems sometimes occur. Call your health care provider if you have any problems or questions after your procedure. °WHAT TO EXPECT AFTER THE PROCEDURE °After your procedure, it is typical to have the following: °· A small amount of discomfort in the area where the biopsy was done and in the right shoulder or shoulder blade. °· A small amount of bruising around the area where the biopsy was done and on the skin over the liver. °· Sleepiness and fatigue for the rest of the day. °HOME CARE INSTRUCTIONS  °· Rest at home for 1-2 days or as directed by your health care provider. °· Have a friend or family member stay with you for at least 24 hours. °· Because of the medicines used during the procedure, you should not do the following things in the first 24 hours: °¨ Drive. °¨ Use machinery. °¨ Be responsible for the care of other people. °¨ Sign legal documents. °¨ Take a bath or shower. °· There are many different ways to close and cover an incision, including stitches, skin glue, and adhesive strips. Follow your health care provider's instructions on: °¨ Incision care. °¨ Bandage (dressing) changes and removal. °¨ Incision closure removal. °· Do not drink alcohol in the first week. °· Do not lift more than 5 pounds or play contact sports for 2 weeks after this test. °· Take medicines only as directed by your health care provider. Do not take medicine containing aspirin or non-steroidal anti-inflammatory medicines such as ibuprofen for 1 week after this test. °· It is your responsibility to get your test results. °SEEK MEDICAL CARE IF:  °· You have increased bleeding from an incision that results in more than a  small spot of blood. °· You have redness, swelling, or increasing pain in any incisions. °· You notice a discharge or a bad smell coming from any of your incisions. °· You have a fever or chills. °SEEK IMMEDIATE MEDICAL CARE IF:  °· You develop swelling, bloating, or pain in your abdomen. °· You become dizzy or faint. °· You develop a rash. °· You are nauseous or vomit. °· You have difficulty breathing, feel short of breath, or feel faint. °· You develop chest pain. °· You have problems with your speech or vision. °· You have trouble balancing or moving your arms or legs. °  °This information is not intended to replace advice given to you by your health care provider. Make sure you discuss any questions you have with your health care provider. °  °Document Released: 11/06/2004 Document Revised: 05/10/2014 Document Reviewed: 06/15/2013 °Elsevier Interactive Patient Education ©2016 [employer] ° °

## 2015-11-14 NOTE — OR Nursing (Signed)
Order corrected for US biopsy after labs resulted today, Dr Allen Norris and Dr Pascal Lux discussed best procedure option. dced transgular liver biopsy order  and changed to US guided

## 2015-11-14 NOTE — Procedures (Signed)
Technically successful US guided biopsy of right lobe of the liver.   EBL: None No immediate complications.   Jay Kenith Trickel, MD Pager #: [phone]   

## 2015-11-14 NOTE — Consult Note (Signed)
Chief Complaint: Elevated LFTs  Referring Physician(s): Baxter  Patient Status: Outpatient  History of Present Illness: Patrick Hurst is a 60 y.o. male with past medical history significant for diabetes, anemia, hyperlipidemia, obstructive sleep apnea, hypothyroidism and asthma who was found to have a nodular appearing liver on abdominal ultrasound and elevated LFTs findings concerning for cirrhosis. The etiology of the cirrhosis is still indeterminate with considerations including steatosis and autoimmune hepatitis. As such, patient presents today for ultrasound guided random liver biopsy for diagnostic purposes.  The patient is accompanied by his wife though serves as his own historian.  The patient is currently without complaint. No fever or chills. No shortness of breath. No chest pain. No yellowing of the skin or eyes. No lower extremity swelling.  Past Medical History  Diagnosis Date  . Diabetes ([place])   . Gout   . Thyroid disease   . GERD (gastroesophageal reflux disease)   . Vitamin D deficiency   . History of chicken pox   . Anemia   . Hyperlipidemia   . Depression   . OSA (obstructive sleep apnea)   . Bell's palsy   . Hypertension   . Allergy   . Gastric ulcer   . Hypothyroidism   . Arthritis     Past Surgical History  Procedure Laterality Date  . Gallbladder surgery    . Shoulder surgery Right 2012    Dr. Leanor Kail  . Cholecystectomy  04/28/2011    Laproscopic; Dr. Pat Patrick  . Cataract extraction Bilateral 2014 and 2015    Allergies: Mucinex  and Levaquin  Medications: Prior to Admission medications   Medication Sig Start Date End Date Taking? Authorizing Provider  albuterol (PROVENTIL HFA;VENTOLIN HFA) 108 (90 Base) MCG/ACT inhaler Inhale 2 puffs into the lungs every 4 (four) hours as needed for wheezing or shortness of breath. 07/14/15  Yes [name], MD  amLODipine-benazepril (LOTREL) 5-40 MG capsule TAKE 1 CAPSULE DAILY (CHANGED  DOSE) 08/23/15  Yes [name], MD  Ascorbic Acid (VITAMIN C) 1000 MG tablet Take 1,000 mg by mouth daily.   Yes Historical Provider, MD  aspirin 81 MG tablet Take 81 mg by mouth daily.   Yes Historical Provider, MD  Calcium Carbonate (CALCIUM 600 PO) Take 1 tablet by mouth daily.   Yes Historical Provider, MD  Chlorpheniramine Maleate (ALLERGY RELIEF PO) Take 1 tablet by mouth daily.   Yes Historical Provider, MD  Cholecalciferol (VITAMIN D3 PO) Take by mouth 2 (two) times daily.    Yes Historical Provider, MD  clotrimazole-betamethasone (LOTRISONE) cream Apply 1 application topically 2 (two) times daily. 04/16/15  Yes [name], MD  colestipol (COLESTID) 1 g tablet Take 2 tablets (2 g total) by mouth 2 (two) times daily. 09/24/15  Yes [name], MD  ferrous sulfate 325 (65 FE) MG tablet Take 325 mg by mouth daily with breakfast.   Yes Historical Provider, MD  hydrochlorothiazide (HYDRODIURIL) 50 MG tablet TAKE ONE TABLET BY MOUTH ONCE DAILY 10/20/15  Yes [name], MD  insulin regular human CONCENTRATED (HUMULIN R) 500 UNIT/ML injection Takes 40 units in the am and 50 units in the pm 08/28/15  Yes [name], MD  levothyroxine (SYNTHROID, LEVOTHROID) 100 MCG tablet Take 1 tablet (100 mcg total) by mouth daily. Patient taking differently: Take 125 mcg by mouth daily.  02/20/15  Yes [name], MD  metFORMIN (GLUCOPHAGE) 1000 MG tablet TAKE ONE TABLET BY MOUTH TWICE DAILY  02/14/15  Yes [name], MD  metoprolol (LOPRESSOR) 50 MG tablet Take 1 tablet (50 mg total) by mouth 2 (two) times daily. 01/10/15  Yes [name], MD  Multiple Vitamin (MULTIVITAMIN) capsule Take 1 capsule by mouth daily.   Yes Historical Provider, MD  Omega-3 Fatty Acids (FISH OIL PO) Take by mouth daily.   Yes Historical Provider, MD  ONE TOUCH ULTRA TEST test strip USE 1 STRIP TWICE A DAY 09/20/15  Yes [name], MD  pantoprazole (PROTONIX) 40 MG tablet TAKE 1 TABLET TWICE A DAY  09/20/15  Yes [name], MD  POTASSIUM PO Take by mouth daily.   Yes Historical Provider, MD  allopurinol (ZYLOPRIM) 300 MG tablet Take 300 mg by mouth daily as needed. Reported on 10/22/2015    Historical Provider, MD  B-D INS SYR ULTRAFINE 1CC/30G 30G X 1/2" 1 ML MISC  09/04/13   Historical Provider, MD  Cyanocobalamin (B-12 PO) Take by mouth daily.    Historical Provider, MD     Family History  Problem Relation Age of Onset  . COPD Mother   . Heart disease Father     Social History   Social History  . Marital Status: Married    Spouse Name: N/A  . Number of Children: N/A  . Years of Education: N/A   Occupational History  . Works at [employer]     Full time   Social History Main Topics  . Smoking status: Former Smoker -- 1.00 packs/day for 15 years    Types: Cigarettes    Quit date: 05/03/1978  . Smokeless tobacco: Former Systems developer  . Alcohol Use: No  . Drug Use: No  . Sexual Activity: Not Asked   Other Topics Concern  . None   Social History Narrative    ECOG Status: 0 - Asymptomatic  Review of Systems: A 12 point ROS discussed and pertinent positives are indicated in the HPI above.  All other systems are negative.  Review of Systems  Constitutional: Negative for fever, diaphoresis, activity change, appetite change and unexpected weight change.  Respiratory: Negative.   Cardiovascular: Negative.   Gastrointestinal: Negative for abdominal pain and abdominal distention.  Skin: Negative for color change.    Vital Signs: BP 160/80 mmHg  Resp 16  SpO2 95%  Physical Exam  Constitutional: He appears well-developed and well-nourished.  Eyes: Conjunctivae are normal.  Cardiovascular: Normal rate and regular rhythm.   Pulmonary/Chest: Effort normal and breath sounds normal.  Abdominal: Soft. He exhibits no mass.  Skin: Skin is warm and dry.  Psychiatric: He has a normal mood and affect. His behavior is normal.  Nursing note and vitals  reviewed.   Mallampati Score:  MD Evaluation Airway: WNL Heart: WNL Abdomen: WNL Chest/ Lungs: WNL ASA  Classification: 1 Mallampati/Airway Score: Two  Imaging: No results found.  Labs:  CBC:  Recent Labs  02/13/15 0806 07/14/15 0855 09/19/15 0847 10/23/15 1458  WBC 5.9 6.9 6.9 5.9  HCT 34.9* 33.3* 35.9* 32.5*  PLT 90* 106* 122* 106*    COAGS: No results for input(s): INR, APTT in the last 8760 hours.  BMP:  Recent Labs  02/13/15 0806 07/14/15 0855  NA 145* 143  K 4.3 4.5  CL 103 101  CO2 26 24  GLUCOSE 153* 205*  BUN 20 27*  CALCIUM 8.1* 8.5*  CREATININE 1.27 1.53*  GFRNONAA 62 49*  GFRAA 72 57*    LIVER FUNCTION TESTS:  Recent Labs  02/13/15 0806 07/14/15 0855  BILITOT 0.6 0.5  AST 32 20  ALT 32 29  ALKPHOS 98 89  PROT 6.1 6.6  ALBUMIN 3.9 4.0    TUMOR MARKERS:  Recent Labs  07/14/15 0855  AFPTM 1.8    Assessment and Plan:  ARRIAN KNOCH is a 60 y.o. male with past medical history significant for diabetes, anemia, hyperlipidemia, obstructive sleep apnea, hypothyroidism and asthma who was found to have a nodular appearing liver on abdominal ultrasound and elevated LFTs findings concerning for cirrhosis. The etiology of the cirrhosis is still indeterminate with considerations including steatosis and autoimmune hepatitis. As such, patient presents today for ultrasound guided random liver biopsy for diagnostic purposes.  The patient is currently without complaint.  Risks and Benefits of ultrasound guided random liver biopsy were discussed with the patient including, but not limited to bleeding, infection, damage to adjacent structures or low yield requiring additional tests.  All of the patient's questions were answered, patient is agreeable to proceed.  Consent signed and in chart.  Thank you for this interesting consult.  I greatly enjoyed meeting Patrick Hurst and look forward to participating in their care.  A copy of this report  was sent to the requesting provider on this date.  Electronically Signed: Sandi Mariscal 11/14/2015, 8:30 AM   I spent a total of 15 Minutes in face to face in clinical consultation, greater than 50% of which was counseling/coordinating care for Ultrasound guided random liver biopsy.

## 2015-11-18 LAB — SURGICAL PATHOLOGY

## 2015-11-19 ENCOUNTER — Telehealth: Payer: Self-pay

## 2015-11-19 NOTE — Telephone Encounter (Signed)
-----   Message from Lucilla Lame, MD sent at 11/19/2015  2:13 PM EDT ----- Please have the patient come in for a follow up.

## 2015-11-19 NOTE — Telephone Encounter (Signed)
Pt scheduled for a follow up appt with Dr Allen Norris on Monday, July 31st.

## 2015-11-25 DIAGNOSIS — G4733 Obstructive sleep apnea (adult) (pediatric): Secondary | ICD-10-CM | POA: Diagnosis not present

## 2015-12-01 ENCOUNTER — Ambulatory Visit (INDEPENDENT_AMBULATORY_CARE_PROVIDER_SITE_OTHER): Payer: BLUE CROSS/BLUE SHIELD | Admitting: Gastroenterology

## 2015-12-01 VITALS — BP 177/74 | HR 66 | Temp 98.3°F | Ht 71.0 in | Wt 309.0 lb

## 2015-12-01 DIAGNOSIS — K760 Fatty (change of) liver, not elsewhere classified: Secondary | ICD-10-CM | POA: Diagnosis not present

## 2015-12-01 NOTE — Progress Notes (Signed)
Primary Care Physician: Lelon Huh, MD  Primary Gastroenterologist:  Dr. Lucilla Lame  Chief Complaint  Patient presents with  . Routine Post Op    Liver Biopsy results     HPI: Patrick Hurst is a 60 y.o. male here for follow-up of his liver biopsy. The patient's liver biopsy showed stage III fibrosis with 60% of his cells be inflamed with steatosis. No sign of any patient having autoimmune hepatitis the biopsy.   Current Outpatient Prescriptions  Medication Sig Dispense Refill  . albuterol (PROVENTIL HFA;VENTOLIN HFA) 108 (90 Base) MCG/ACT inhaler Inhale 2 puffs into the lungs every 4 (four) hours as needed for wheezing or shortness of breath. 1 Inhaler 2  . allopurinol (ZYLOPRIM) 300 MG tablet Take 300 mg by mouth daily as needed. Reported on 10/22/2015    . amLODipine-benazepril (LOTREL) 5-40 MG capsule TAKE 1 CAPSULE DAILY (CHANGED DOSE) 90 capsule 4  . Ascorbic Acid (VITAMIN C) 1000 MG tablet Take 1,000 mg by mouth daily.    Marland Kitchen aspirin 81 MG tablet Take 81 mg by mouth daily.    . B-D INS SYR ULTRAFINE 1CC/30G 30G X 1/2" 1 ML MISC     . Calcium Carbonate (CALCIUM 600 PO) Take 1 tablet by mouth daily.    . Chlorpheniramine Maleate (ALLERGY RELIEF PO) Take 1 tablet by mouth daily.    . Cholecalciferol (VITAMIN D3 PO) Take by mouth 2 (two) times daily.     . clotrimazole-betamethasone (LOTRISONE) cream Apply 1 application topically 2 (two) times daily. 30 g 0  . colestipol (COLESTID) 1 g tablet Take 2 tablets (2 g total) by mouth 2 (two) times daily. 120 tablet 3  . Cyanocobalamin (B-12 PO) Take by mouth daily.    . ferrous sulfate 325 (65 FE) MG tablet Take 325 mg by mouth daily with breakfast.    . hydrochlorothiazide (HYDRODIURIL) 50 MG tablet TAKE ONE TABLET BY MOUTH ONCE DAILY 30 tablet 12  . insulin regular human CONCENTRATED (HUMULIN R) 500 UNIT/ML injection Takes 40 units in the am and 50 units in the pm 100 mL 4  . levothyroxine (SYNTHROID, LEVOTHROID) 100 MCG tablet Take  1 tablet (100 mcg total) by mouth daily. (Patient taking differently: Take 125 mcg by mouth daily. ) 90 tablet 4  . metFORMIN (GLUCOPHAGE) 1000 MG tablet TAKE ONE TABLET BY MOUTH TWICE DAILY 180 tablet 0  . metoprolol (LOPRESSOR) 50 MG tablet Take 1 tablet (50 mg total) by mouth 2 (two) times daily. 180 tablet 3  . Multiple Vitamin (MULTIVITAMIN) capsule Take 1 capsule by mouth daily.    . Omega-3 Fatty Acids (FISH OIL PO) Take by mouth daily.    . ONE TOUCH ULTRA TEST test strip USE 1 STRIP TWICE A DAY 200 each 2  . pantoprazole (PROTONIX) 40 MG tablet TAKE 1 TABLET TWICE A DAY 180 tablet 2  . POTASSIUM PO Take by mouth daily.     No current facility-administered medications for this visit.     Allergies as of 12/01/2015 - Review Complete 12/01/2015  Allergen Reaction Noted  . Mucinex  [guaifenesin er]  02/12/2015  . Levaquin [levofloxacin] Palpitations 05/14/2015    ROS:  General: Negative for anorexia, weight loss, fever, chills, fatigue, weakness. ENT: Negative for hoarseness, difficulty swallowing , nasal congestion. CV: Negative for chest pain, angina, palpitations, dyspnea on exertion, peripheral edema.  Respiratory: Negative for dyspnea at rest, dyspnea on exertion, cough, sputum, wheezing.  GI: See history of present illness. GU:  Negative  for dysuria, hematuria, urinary incontinence, urinary frequency, nocturnal urination.  Endo: Negative for unusual weight change.    Physical Examination:   BP (!) 177/74 (BP Location: Right Arm, Patient Position: Sitting)   Pulse 66   Temp 98.3 F (36.8 C) (Oral)   Ht 5\' 11"  (1.803 m)   Wt (!) 309 lb (140.2 kg)   BMI 43.10 kg/m   General: Well-nourished, well-developed in no acute distress.  Neuro: Alert and oriented x 3.  Grossly intact. Skin: Warm and dry, no jaundice.   Psych: Alert and cooperative, normal mood and affect.  Labs:    Imaging Studies: US Biopsy  Result Date: 11/15/2015 INDICATION: Cirrhosis and elevated  LFTs of uncertain etiology. Please perform random liver biopsy to evaluate for either hepatic steatosis versus autoimmune hepatitis. EXAM: ULTRASOUND GUIDED LIVER BIOPSY COMPARISON:  Abdominal ultrasound - 09/24/2015; CT abdomen pelvis - 03/13/2015 MEDICATIONS: None ANESTHESIA/SEDATION: Fentanyl 100 mcg IV; Versed 2 mg IV Total Moderate Sedation time: 14 minutes; The patient was continuously monitored during the procedure by the interventional radiology nurse under my direct supervision. COMPLICATIONS: None immediate. PROCEDURE: Informed written consent was obtained from the patient after a discussion of the risks, benefits and alternatives to treatment. The patient understands and consents the procedure. A timeout was performed prior to the initiation of the procedure. Ultrasound scanning was performed of the right upper abdominal quadrant and the procedure was planned. The right upper abdomen was prepped and draped in the usual sterile fashion. The overlying soft tissues were anesthetized with 1% lidocaine with epinephrine. A 17 gauge, 6.8 cm co-axial needle was advanced into a peripheral aspect of the right lobe of the liver and 3 core biopsies were obtained with an 18 gauge core device under direct ultrasound guidance. The co-axial needle track was embolized with the administration of a Gel-Foam slurry. Superficial hemostasis was obtained with manual compression. Post procedural scanning was negative for definitive area of hemorrhage. A dressing was placed. The patient tolerated the procedure well without immediate post procedural complication. IMPRESSION: Technically successful ultrasound guided liver biopsy. Electronically Signed   By: Sandi Mariscal M.D.   On: Nov 15, 2015 11:09    Assessment and Plan:   Patrick Hurst is a 60 y.o. y/o male who is here to review his liver biopsy. The patient was told that he needs to lose weight keep his cholesterol and triglycerides under control and keep his diabetes under  control. The patient will follow up in 6 months to review how he is done with this lifestyle modification. The patient has been explained the plan and agrees with it.   Note: This dictation was prepared with Dragon dictation along with smaller phrase technology. Any transcriptional errors that result from this process are unintentional.

## 2015-12-21 ENCOUNTER — Other Ambulatory Visit: Payer: Self-pay | Admitting: Family Medicine

## 2015-12-26 DIAGNOSIS — G4733 Obstructive sleep apnea (adult) (pediatric): Secondary | ICD-10-CM | POA: Diagnosis not present

## 2015-12-31 ENCOUNTER — Other Ambulatory Visit: Payer: Self-pay | Admitting: Family Medicine

## 2015-12-31 MED ORDER — HYDROCHLOROTHIAZIDE 50 MG PO TABS: 50.0000 mg | ORAL_TABLET | Freq: Every day | ORAL | 4 refills | Status: DC

## 2015-12-31 MED ORDER — COLESTIPOL HCL 1 G PO TABS: 2.0000 g | ORAL_TABLET | Freq: Two times a day (BID) | ORAL | 4 refills | Status: DC

## 2015-12-31 NOTE — Telephone Encounter (Signed)
Patient requesting [facility]

## 2015-12-31 NOTE — Telephone Encounter (Signed)
Pt's wife called requesting refills on her husbands   Hydrochorothiazide 50mg   Colestipol 1 gr  Mail order please [facility]  Thank sTeri

## 2016-01-15 ENCOUNTER — Ambulatory Visit: Payer: BLUE CROSS/BLUE SHIELD | Admitting: Podiatry

## 2016-01-26 DIAGNOSIS — G4733 Obstructive sleep apnea (adult) (pediatric): Secondary | ICD-10-CM | POA: Diagnosis not present

## 2016-02-05 ENCOUNTER — Encounter: Payer: Self-pay | Admitting: Podiatry

## 2016-02-05 ENCOUNTER — Ambulatory Visit (INDEPENDENT_AMBULATORY_CARE_PROVIDER_SITE_OTHER): Payer: BLUE CROSS/BLUE SHIELD | Admitting: Podiatry

## 2016-02-05 DIAGNOSIS — E1149 Type 2 diabetes mellitus with other diabetic neurological complication: Secondary | ICD-10-CM

## 2016-02-05 DIAGNOSIS — M14671 Charcot's joint, right ankle and foot: Secondary | ICD-10-CM

## 2016-02-05 NOTE — Progress Notes (Signed)
Patient ID: KAILO KOSIK, male   DOB: 1955/09/06, 60 y.o.   MRN: [id number]  Subjective: 60 year old male presents the office today for follow-up evaluation of right foot pain. He states he was soaking intermittent pain in the joint pain is in the center of his foot on the bottom. He does continue to wear the [place] brace. Denies any increase in swelling or redness or any warmth to his foot. No recent injury or trauma. He has started to increase his walking recently as he purchase a treadmill that he uses at home.  No other complaints at this time in no acute changes otherwise.  Objective: General: AAO x3, NAD  Dermatological: Skin is warm, dry and supple bilateral. There are no open sores, no preulcerative lesions, no rash or signs of infection present.  Vascular: Dorsalis Pedis artery and Posterior Tibial artery pedal pulses are 2/4 bilateral with immedate capillary fill time. Pedal hair growth present. There is no pain with calf compression, swelling, warmth, erythema.   Neruologic: There is decrease in vibratory sensation decreased sensation with Derrel Nip monofilament.  Musculoskeletal: There is flatfoot deformity present for the right side was the left. The right foot continues to be wider than the left foot and there is a mild bulge on the plantar aspect of the midfoot which is likely due from Charcot. There is no one edema, erythema, increase in warmth. There is no tenderness on the plantar fascia onto other areas of the foot. Decrease in medial arch height.   Gait: Unassisted, Nonantalgic.   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 Also dispensed further  pads. We'll try this to see how this does and possibly make a new insert to use in the brace. Also recommend continue with the brace when walking -I discussed with them signs and symptoms of acute Charcot and to call the office me that she didn't occur. -Follow-up in 4-6 weeks or sooner if needed. Call any questions concerns.  Celesta Gentile, DPM

## 2016-02-16 ENCOUNTER — Ambulatory Visit (INDEPENDENT_AMBULATORY_CARE_PROVIDER_SITE_OTHER): Payer: BLUE CROSS/BLUE SHIELD | Admitting: Family Medicine

## 2016-02-16 ENCOUNTER — Encounter: Payer: Self-pay | Admitting: Family Medicine

## 2016-02-16 VITALS — BP 120/70 | HR 62 | Temp 98.3°F | Resp 16 | Ht 71.0 in | Wt 292.0 lb

## 2016-02-16 DIAGNOSIS — E785 Hyperlipidemia, unspecified: Secondary | ICD-10-CM | POA: Diagnosis not present

## 2016-02-16 DIAGNOSIS — E1149 Type 2 diabetes mellitus with other diabetic neurological complication: Secondary | ICD-10-CM

## 2016-02-16 DIAGNOSIS — Z0000 Encounter for general adult medical examination without abnormal findings: Secondary | ICD-10-CM

## 2016-02-16 DIAGNOSIS — D696 Thrombocytopenia, unspecified: Secondary | ICD-10-CM

## 2016-02-16 DIAGNOSIS — R7989 Other specified abnormal findings of blood chemistry: Secondary | ICD-10-CM

## 2016-02-16 DIAGNOSIS — E039 Hypothyroidism, unspecified: Secondary | ICD-10-CM | POA: Diagnosis not present

## 2016-02-16 DIAGNOSIS — K7400 Hepatic fibrosis, unspecified: Secondary | ICD-10-CM

## 2016-02-16 DIAGNOSIS — Z125 Encounter for screening for malignant neoplasm of prostate: Secondary | ICD-10-CM | POA: Diagnosis not present

## 2016-02-16 DIAGNOSIS — I10 Essential (primary) hypertension: Secondary | ICD-10-CM | POA: Diagnosis not present

## 2016-02-16 DIAGNOSIS — Z23 Encounter for immunization: Secondary | ICD-10-CM | POA: Diagnosis not present

## 2016-02-16 DIAGNOSIS — Z1331 Encounter for screening for depression: Secondary | ICD-10-CM

## 2016-02-16 DIAGNOSIS — Z794 Long term (current) use of insulin: Secondary | ICD-10-CM

## 2016-02-16 DIAGNOSIS — G4733 Obstructive sleep apnea (adult) (pediatric): Secondary | ICD-10-CM

## 2016-02-16 DIAGNOSIS — Z1389 Encounter for screening for other disorder: Secondary | ICD-10-CM

## 2016-02-16 DIAGNOSIS — E1140 Type 2 diabetes mellitus with diabetic neuropathy, unspecified: Secondary | ICD-10-CM

## 2016-02-16 DIAGNOSIS — E349 Endocrine disorder, unspecified: Secondary | ICD-10-CM | POA: Diagnosis not present

## 2016-02-16 NOTE — Patient Instructions (Addendum)
OTC DHEA supplement may increase your testosterone level

## 2016-02-16 NOTE — Progress Notes (Signed)
Patient: Patrick Hurst, Male    DOB: 08/08/1955, 60 y.o.   MRN: [id number] Visit Date: 02/16/2016  Today's Provider: Lelon Huh, MD   Chief Complaint  Patient presents with  . Annual Exam  . Hypertension  . Hypothyroidism  . Hyperlipidemia   Subjective:    Annual physical exam Patrick Hurst is a 60 y.o. male who presents today for health maintenance and complete physical. He feels well. He reports exercising yes /walking on [place]. He reports he is sleeping well.  ----------------------------------------------------------------   Hypothyroidism, unspecified hypothyroidism type: From 07/14/2015-no changes. Lab Results  Component Value Date   TSH 7.210 (H) 07/14/2015        Hypertension, follow-up:  BP Readings from Last 3 Encounters:  02/16/16 120/70  12/01/15 (!) 177/74  11/14/15 (!) 150/72    He was last seen for hypertension 7 months ago.  BP at that visit was 162-80. Management since that visit includes; increased HCTZ to 50 mg qd.He reports good compliance with treatment. He is not having side effects. none He is exercising. He is adherent to low salt diet.   Outside blood pressures are 120/70. He is experiencing none.  Patient denies none.   Cardiovascular risk factors include diabetes mellitus.  Use of agents associated with hypertension: none.   ----------------------------------------------------------------    Lipid/Cholesterol, Follow-up:   Last seen for this 5 months ago.  Management since that visit includes; stopped atorvastatin and started colestipol 1 gram due to showing signs of liver cirrhosis.  Last Lipid Panel:    Component Value Date/Time   CHOL 126 02/13/2015 0806   CHOL 180 03/14/2012 0321   TRIG 365 (H) 02/13/2015 0806   TRIG 594 (H) 03/14/2012 0321   HDL 26 (L) 02/13/2015 0806   HDL 21 (L) 03/14/2012 0321   CHOLHDL 4.8 02/13/2015 0806   VLDL SEE COMMENT 03/14/2012 0321   LDLCALC 27 02/13/2015 0806   LDLCALC SEE COMMENT 03/14/2012 0321    He reports good compliance with treatment. He is not having side effects. none  Wt Readings from Last 3 Encounters:  02/16/16 292 lb (132.5 kg)  12/01/15 (!) 309 lb (140.2 kg)  10/23/15 (!) 306 lb (138.8 kg)    ----------------------------------------------------------------  Follow up Diabetes Lab Results  Component Value Date   HGBA1C 5.6 07/14/2015   He states he is taking 30 units Humulin twice a day. Sugars have been in the mid 100s, but having some mildly symptomatic lows in the evening when he is walking, usually an hour or two after his second daily dose of Humulin.    Review of Systems  Constitutional: Positive for fatigue.  HENT: Positive for rhinorrhea and sneezing.   Eyes: Negative.   Respiratory: Positive for apnea and shortness of breath.   Cardiovascular: Positive for leg swelling.  Gastrointestinal: Negative.   Endocrine: Positive for polydipsia.  Genitourinary: Negative.   Musculoskeletal: Positive for arthralgias.  Skin: Positive for color change.  Allergic/Immunologic: Negative.   Neurological: Positive for numbness.  Hematological: Negative.   Psychiatric/Behavioral: Negative.     Social History      He  reports that he quit smoking about 37 years ago. His smoking use included Cigarettes. He has a 15.00 pack-year smoking history. He has quit using smokeless tobacco. He reports that he does not drink alcohol or use drugs.       Social History   Social History  . Marital status: Married    Spouse name: N/A  .  Number of children: N/A  . Years of education: N/A   Occupational History  . Works at [employer]     Full time   Social History Main Topics  . Smoking status: Former Smoker    Packs/day: 1.00    Years: 15.00    Types: Cigarettes    Quit date: 05/03/1978  . Smokeless tobacco: Former Systems developer  . Alcohol use No  . Drug use: No  . Sexual activity: Not Asked   Other Topics Concern  . None    Social History Narrative  . None    Past Medical History:  Diagnosis Date  . Allergy   . Anemia   . Arthritis   . Bell's palsy   . Depression   . Diabetes ([place])   . Gastric ulcer   . GERD (gastroesophageal reflux disease)   . Gout   . History of chicken pox   . Hyperlipidemia   . Hypertension   . Hypothyroidism   . OSA (obstructive sleep apnea)   . Thyroid disease   . Vitamin D deficiency      Patient Active Problem List   Diagnosis Date Noted  . Primary osteoarthritis of both knees 10/01/2015  . Morbid (severe) obesity due to excess calories ([place]) 10/01/2015  . Hepatomegaly 09/19/2015  . Splenomegaly 09/19/2015  . ANA positive 07/21/2015  . Hepatic fibrosis ([place]) 04/16/2015  . Charcot ankle 04/02/2015  . Thrombocytopenia ([place]) 02/14/2015  . Allergic rhinitis 02/12/2015  . Bell palsy 02/12/2015  . Carpal tunnel syndrome 02/12/2015  . Clinical depression 02/12/2015  . Diabetes mellitus with neuropathy ([place]) 02/12/2015  . Edema 02/12/2015  . Erectile dysfunction 02/12/2015  . HLD (hyperlipidemia) 02/12/2015  . Hypertension 02/12/2015  . Hypothyroidism 02/12/2015  . Anemia, iron deficiency 02/12/2015  . Low testosterone 02/12/2015  . Neuropathy ([place]) 02/12/2015  . Obesity 02/12/2015  . Peripheral neuropathy ([place]) 02/12/2015  . Obstructive sleep apnea 02/12/2015  . Snores 02/12/2015  . Gout 05/05/2012  . Gastric ulcer 11/21/2009    Past Surgical History:  Procedure Laterality Date  . CATARACT EXTRACTION Bilateral 2014 and 2015  . CHOLECYSTECTOMY  04/28/2011   Laproscopic; Dr. Pat Patrick  . GALLBLADDER SURGERY    . SHOULDER SURGERY Right 2012   Dr. Leanor Kail    Family History        Family Status  Relation Status  . Mother Deceased  . Father Alive        His family history includes COPD in his mother; Heart disease in his father.    Allergies  Allergen Reactions  . Mucinex  [Guaifenesin Er]     Throat swelling, increases heart rate  .  Levaquin [Levofloxacin] Palpitations    Current Meds  Medication Sig  . albuterol (PROVENTIL HFA;VENTOLIN HFA) 108 (90 Base) MCG/ACT inhaler Inhale 2 puffs into the lungs every 4 (four) hours as needed for wheezing or shortness of breath.  . allopurinol (ZYLOPRIM) 300 MG tablet Take 300 mg by mouth daily as needed. Reported on 10/22/2015  . amLODipine-benazepril (LOTREL) 5-40 MG capsule TAKE 1 CAPSULE DAILY (CHANGED DOSE)  . Ascorbic Acid (VITAMIN C) 1000 MG tablet Take 1,000 mg by mouth daily.  Marland Kitchen aspirin 81 MG tablet Take 81 mg by mouth daily.  . B-D INS SYR ULTRAFINE 1CC/30G 30G X 1/2" 1 ML MISC   . Calcium Carbonate (CALCIUM 600 PO) Take 1 tablet by mouth daily.  . Chlorpheniramine Maleate (ALLERGY RELIEF PO) Take 1 tablet by mouth daily.  . Cholecalciferol (  VITAMIN D3 PO) Take by mouth 2 (two) times daily.   . colestipol (COLESTID) 1 g tablet Take 2 tablets (2 g total) by mouth 2 (two) times daily.  . Cyanocobalamin (B-12 PO) Take by mouth daily.  . ferrous sulfate 325 (65 FE) MG tablet Take 325 mg by mouth daily with breakfast.  . hydrochlorothiazide (HYDRODIURIL) 50 MG tablet Take 1 tablet (50 mg total) by mouth daily.  . insulin regular human CONCENTRATED (HUMULIN R) 500 UNIT/ML injection Takes 40 units in the am and 50 units in the pm  . levothyroxine (SYNTHROID, LEVOTHROID) 100 MCG tablet Take 1 tablet (100 mcg total) by mouth daily. (Patient taking differently: Take 125 mcg by mouth daily. )  . metFORMIN (GLUCOPHAGE) 1000 MG tablet TAKE ONE TABLET BY MOUTH TWICE DAILY  . metoprolol (LOPRESSOR) 50 MG tablet TAKE 1 TABLET TWICE A DAY  . Multiple Vitamin (MULTIVITAMIN) capsule Take 1 capsule by mouth daily.  . ONE TOUCH ULTRA TEST test strip USE 1 STRIP TWICE A DAY  . pantoprazole (PROTONIX) 40 MG tablet TAKE 1 TABLET TWICE A DAY  . POTASSIUM PO Take by mouth daily.    Patient Care Team: [name], MD as PCP - General (Family Medicine) Lucilla Lame, MD as Consulting Physician  (Gastroenterology)     Objective:   Vitals: BP 120/70 (BP Location: Right Arm, Patient Position: Sitting, Cuff Size: Large)   Pulse 62   Temp 98.3 F (36.8 C) (Oral)   Resp 16   Ht 5\' 11"  (1.803 m)   Wt 292 lb (132.5 kg)   SpO2 97%   BMI 40.73 kg/m    Physical Exam   General Appearance:    Alert, cooperative, no distress, appears stated age, morbidly obese  Head:    Normocephalic, without obvious abnormality, atraumatic  Eyes:    PERRL, conjunctiva/corneas clear, EOM's intact, fundi    benign, both eyes       Ears:    Normal TM's and external ear canals, both ears  Nose:   Nares normal, septum midline, mucosa normal, no drainage   or sinus tenderness  Throat:   Lips, mucosa, and tongue normal; teeth and gums normal  Neck:   Supple, symmetrical, trachea midline, no adenopathy;       thyroid:  No enlargement/tenderness/nodules; no carotid   bruit or JVD  Back:     Symmetric, no curvature, ROM normal, no CVA tenderness  Lungs:     Clear to auscultation bilaterally, respirations unlabored  Chest wall:    No tenderness or deformity  Heart:    Regular rate and rhythm, S1 and S2 normal, no murmur, rub   or gallop  Abdomen:     Soft, non-tender, bowel sounds active all four quadrants,    no masses, no organomegaly  Genitalia:    deferred  Rectal:    deferred  Extremities:   Extremities normal, atraumatic, no cyanosis or edema  Pulses:   2+ and symmetric all extremities  Skin:   Skin color, texture, turgor normal, no rashes or lesions  Lymph nodes:   Cervical, supraclavicular, and axillary nodes normal  Neurologic:   CNII-XII intact. Normal strength, sensation and reflexes      throughout    Depression Screen PHQ 2/9 Scores 02/16/2016 02/12/2015  PHQ - 2 Score 2 4  PHQ- 9 Score 12 11      Assessment & Plan:     Routine Health Maintenance and Physical Exam  Exercise Activities and Dietary recommendations Goals  None      Immunization History  Administered  Date(s) Administered  . Influenza,inj,Quad PF,36+ Mos 02/12/2015  . Pneumococcal Polysaccharide-23 05/03/2000  . Tdap 12/30/2006    Health Maintenance  Topic Date Due  . FOOT EXAM  04/06/1967  . OPHTHALMOLOGY EXAM  04/06/1967  . HIV Screening  04/05/1972  . PNEUMOCOCCAL POLYSACCHARIDE VACCINE (2) 05/03/2005  . INFLUENZA VACCINE  12/02/2015  . HEMOGLOBIN A1C  01/14/2016  . TETANUS/TDAP  12/29/2016  . COLONOSCOPY  11/25/2019  . Hepatitis C Screening  Completed      Discussed health benefits of physical activity, and encouraged him to engage in regular exercise appropriate for his age and condition.    --------------------------------------------------------------------  1. Annual physical exam  - CBC  2. Type 2 diabetes mellitus with diabetic neuropathy, with long-term current use of insulin ([place]) Is having some hypoglycemia. Consider reducing Humulin if A1c is well controlled.  - Hemoglobin A1c  3. Hyperlipidemia, unspecified hyperlipidemia type Doing well on colestid, currently off statin which was stopped during evaluation of liver disease, which is now thought to be entirely due to fatty liver. Consider restarting statin unless lipids are very low, or if liver functions significantly elevated.  - Lipid panel  4. Essential hypertension Well controlled.  Continue current medications.    5. Hypothyroidism, unspecified type  - TSH  6. Obstructive sleep apnea   7. Thrombocytopenia (HCC)  - CBC  8. Type II diabetes mellitus with neurological manifestations ([place])   9. Morbid (severe) obesity due to excess calories ([place]) Doing better with diet and exercise.   10. Prostate cancer screening  - PSA  11. Low testosterone Was previously prescribed topical testosterone, but was too expensive. He is willing to start injectable testosterone levels. He is having quite a bit of trouble with ED and is willing to try sildenafil, but he prefers to see how testosterone looks  first.  - Testosterone,Free and Total  12. Hepatic fibrosis ([place]) Continue surveillance managed by Dr. Allen Norris.    13. Positive depression screening He scored 12 on PHQ-9 today, much of this related to fatigue. This is most likely due to his chronic medication conditions and untreated hypogonadism. Will readdress after reviewing and addressing labs. Lelon Huh, MD  [facility]

## 2016-02-17 DIAGNOSIS — E785 Hyperlipidemia, unspecified: Secondary | ICD-10-CM | POA: Diagnosis not present

## 2016-02-17 DIAGNOSIS — E039 Hypothyroidism, unspecified: Secondary | ICD-10-CM | POA: Diagnosis not present

## 2016-02-17 DIAGNOSIS — E1140 Type 2 diabetes mellitus with diabetic neuropathy, unspecified: Secondary | ICD-10-CM | POA: Diagnosis not present

## 2016-02-17 DIAGNOSIS — D696 Thrombocytopenia, unspecified: Secondary | ICD-10-CM | POA: Diagnosis not present

## 2016-02-17 DIAGNOSIS — Z0000 Encounter for general adult medical examination without abnormal findings: Secondary | ICD-10-CM | POA: Diagnosis not present

## 2016-02-17 DIAGNOSIS — Z794 Long term (current) use of insulin: Secondary | ICD-10-CM | POA: Diagnosis not present

## 2016-02-19 LAB — CBC
HEMATOCRIT: 30.5 % — AB (ref 37.5–51.0)
HEMOGLOBIN: 10 g/dL — AB (ref 12.6–17.7)
MCH: 30.8 pg (ref 26.6–33.0)
MCHC: 32.8 g/dL (ref 31.5–35.7)
MCV: 94 fL (ref 79–97)
Platelets: 77 10*3/uL — CL (ref 150–379)
RBC: 3.25 x10E6/uL — ABNORMAL LOW (ref 4.14–5.80)
RDW: 14.2 % (ref 12.3–15.4)
WBC: 5.4 10*3/uL (ref 3.4–10.8)

## 2016-02-19 LAB — TSH: TSH: 4.44 u[IU]/mL (ref 0.450–4.500)

## 2016-02-19 LAB — HEMOGLOBIN A1C
ESTIMATED AVERAGE GLUCOSE: 91 mg/dL
HEMOGLOBIN A1C: 4.8 % (ref 4.8–5.6)

## 2016-02-19 LAB — LIPID PANEL
CHOL/HDL RATIO: 7.9 ratio — AB (ref 0.0–5.0)
Cholesterol, Total: 182 mg/dL (ref 100–199)
HDL: 23 mg/dL — ABNORMAL LOW (ref >39)
TRIGLYCERIDES: 667 mg/dL — AB (ref 0–149)

## 2016-02-19 LAB — PSA: Prostate Specific Ag, Serum: 0.3 ng/mL (ref 0.0–4.0)

## 2016-02-19 LAB — TESTOSTERONE,FREE AND TOTAL
TESTOSTERONE: 137 ng/dL — AB (ref 264–916)
Testosterone, Free: 6.8 pg/mL — ABNORMAL LOW (ref 7.2–24.0)

## 2016-02-20 ENCOUNTER — Telehealth: Payer: Self-pay | Admitting: Family Medicine

## 2016-02-20 ENCOUNTER — Telehealth: Payer: Self-pay

## 2016-02-20 DIAGNOSIS — R7989 Other specified abnormal findings of blood chemistry: Secondary | ICD-10-CM

## 2016-02-20 MED ORDER — ATORVASTATIN CALCIUM 40 MG PO TABS: 40.0000 mg | ORAL_TABLET | Freq: Every day | ORAL | 3 refills | Status: DC

## 2016-02-20 MED ORDER — TESTOSTERONE CYPIONATE 100 MG/ML IM SOLN: 100.0000 mg | INTRAMUSCULAR | 0 refills | Status: DC

## 2016-02-20 NOTE — Telephone Encounter (Signed)
-----   Message from [name], MD sent at 02/19/2016  2:09 PM EDT ----- Platelets have dropped to 77, normal is over 150. This is caused by fatty liver. This does not usually require any treatment until it drops below 50 Testosterone levels are low. He would probably feel more energetic, improve his metabolism and improve his cholesterol if he start testosterone injection. Recommend call in testosterone cypionate 100mg /ml, 1 ml IM every 2 weeks 64mL vial, no refills. May also call in 1 ml Syringe and needles. He can bring prescription, needles and syringes to office for instructions on administering medication. Need to follow up in 2 months to check testosterone and platelets.  Triglycerides are very high at 667 (normal is under 150), need to change from colestipol back to atorvastatin 40mg  a day, #30, rf x 3

## 2016-02-20 NOTE — Telephone Encounter (Signed)
Wife called saying the Rx for atorvastatin was called in but not the testosterone or supplies.  [place], [facility].  She said the phar.  told her they haven't received anything but the atorvastatin.   Please advise.  Thanks Con Memos

## 2016-02-20 NOTE — Telephone Encounter (Signed)
Pt's wife Hilda Blades stated she called [facility] again and they advised her they haven't received the Rx for  testosterone cypionate (DEPOTESTOTERONE CYPIONATE) 100 MG/ML injection, needles, or syringes by phone or voicemail. Hilda Blades is requesting it to be called in again and request a nurse return her call to advise her what size needles this requires and when pt should come in to be shown how to administer the medication since it is so late in the day. Please advise. Thanks TNP

## 2016-02-20 NOTE — Telephone Encounter (Signed)
Medication was called in to pharmacy. Per Linus Salmons.

## 2016-02-20 NOTE — Telephone Encounter (Signed)
Patient advised and verbally voiced understanding. Patient agrees to start testosterone injections and change to Atorvastatin.Follow up appointment scheduled 04/19/2016 at 8am. Atorvastatin sent into the pharmacy and medication list has been updated.

## 2016-02-20 NOTE — Telephone Encounter (Signed)
Testosterone Cypionate called into pharmacy along with needles and syringes.

## 2016-02-20 NOTE — Telephone Encounter (Signed)
It looks like the Testosterone was called into [facility] on [address] instead of [facility] [address]. I called both pharmacies and informed them that the prescription should have been called in to [address]. Tried calling Dedra to advise her of this. Bo answer. Left message to call back.

## 2016-02-25 DIAGNOSIS — G4733 Obstructive sleep apnea (adult) (pediatric): Secondary | ICD-10-CM | POA: Diagnosis not present

## 2016-03-02 ENCOUNTER — Other Ambulatory Visit: Payer: Self-pay | Admitting: Family Medicine

## 2016-03-18 ENCOUNTER — Ambulatory Visit: Payer: BLUE CROSS/BLUE SHIELD | Admitting: Podiatry

## 2016-03-27 DIAGNOSIS — G4733 Obstructive sleep apnea (adult) (pediatric): Secondary | ICD-10-CM | POA: Diagnosis not present

## 2016-03-29 ENCOUNTER — Other Ambulatory Visit: Payer: Self-pay | Admitting: Family Medicine

## 2016-04-06 DIAGNOSIS — M179 Osteoarthritis of knee, unspecified: Secondary | ICD-10-CM | POA: Diagnosis not present

## 2016-04-06 DIAGNOSIS — M25562 Pain in left knee: Secondary | ICD-10-CM

## 2016-04-06 DIAGNOSIS — E6609 Other obesity due to excess calories: Secondary | ICD-10-CM | POA: Diagnosis not present

## 2016-04-06 DIAGNOSIS — M25561 Pain in right knee: Secondary | ICD-10-CM | POA: Insufficient documentation

## 2016-04-06 DIAGNOSIS — D696 Thrombocytopenia, unspecified: Secondary | ICD-10-CM | POA: Diagnosis not present

## 2016-04-06 DIAGNOSIS — M170 Bilateral primary osteoarthritis of knee: Secondary | ICD-10-CM | POA: Diagnosis not present

## 2016-04-19 ENCOUNTER — Encounter: Payer: Self-pay | Admitting: Family Medicine

## 2016-04-19 ENCOUNTER — Ambulatory Visit (INDEPENDENT_AMBULATORY_CARE_PROVIDER_SITE_OTHER): Payer: BLUE CROSS/BLUE SHIELD | Admitting: Family Medicine

## 2016-04-19 VITALS — BP 156/72 | HR 55 | Temp 98.8°F | Resp 16 | Wt 292.0 lb

## 2016-04-19 DIAGNOSIS — D696 Thrombocytopenia, unspecified: Secondary | ICD-10-CM | POA: Diagnosis not present

## 2016-04-19 DIAGNOSIS — E039 Hypothyroidism, unspecified: Secondary | ICD-10-CM | POA: Diagnosis not present

## 2016-04-19 DIAGNOSIS — J069 Acute upper respiratory infection, unspecified: Secondary | ICD-10-CM

## 2016-04-19 DIAGNOSIS — R7989 Other specified abnormal findings of blood chemistry: Secondary | ICD-10-CM

## 2016-04-19 DIAGNOSIS — E349 Endocrine disorder, unspecified: Secondary | ICD-10-CM

## 2016-04-19 DIAGNOSIS — Z794 Long term (current) use of insulin: Secondary | ICD-10-CM | POA: Diagnosis not present

## 2016-04-19 DIAGNOSIS — E785 Hyperlipidemia, unspecified: Secondary | ICD-10-CM

## 2016-04-19 DIAGNOSIS — E1140 Type 2 diabetes mellitus with diabetic neuropathy, unspecified: Secondary | ICD-10-CM | POA: Diagnosis not present

## 2016-04-19 MED ORDER — AMOXICILLIN 500 MG PO CAPS: 1000.0000 mg | ORAL_CAPSULE | Freq: Two times a day (BID) | ORAL | 0 refills | Status: AC

## 2016-04-19 NOTE — Progress Notes (Signed)
Patient: Patrick Hurst Male    DOB: Nov 20, 1955   60 y.o.   MRN: [id number] Visit Date: 04/19/2016  Today's Provider: Lelon Huh, MD   Chief Complaint  Patient presents with  . Follow-up    Low Testosterone and low platelets   Subjective:    HPI Follow up Low Testosterone: Patient was last seen for this problem 2 months ago. Management during that visit includes checking labs which showed a low testosterone level. Patient was started on testosterone injections and advised to follow up in 2 months. Patient reports good compliance with treatment, good tolerance, but states he doesn't feel any different. Is still tired and moody, but no adverse effects.   Follow up Low Platelets: Patient was seen 2 months ago. Lab results from that visit showed low platelet count at 77. No changes were made.  Follow up hypertriglyceridemia Lab Results  Component Value Date   CHOL 182 02/17/2016   HDL 23 (L) 02/17/2016   [place] Comment 02/17/2016   TRIG 667 (HH) 02/17/2016   CHOLHDL 7.9 (H) 02/17/2016   Was changed from colestipol back to atorvastatin after lipids last checked in October. States he is tolerating well with no adverse effects.    Wt Readings from Last 3 Encounters:  04/19/16 292 lb (132.5 kg)  02/16/16 292 lb (132.5 kg)  12/01/15 (!) 309 lb (140.2 kg)   Also complains of persistent nasal congestion drainage, and sinus pressure for the last week. Coughs up green phlegm in the morning.      Allergies  Allergen Reactions  . Mucinex  [Guaifenesin Er]     Throat swelling, increases heart rate  . Levaquin [Levofloxacin] Palpitations     Current Outpatient Prescriptions:  .  albuterol (PROVENTIL HFA;VENTOLIN HFA) 108 (90 Base) MCG/ACT inhaler, Inhale 2 puffs into the lungs every 4 (four) hours as needed for wheezing or shortness of breath., Disp: 1 Inhaler, Rfl: 2 .  allopurinol (ZYLOPRIM) 300 MG tablet, Take 300 mg by mouth daily as needed. Reported on 10/22/2015,  Disp: , Rfl:  .  amLODipine-benazepril (LOTREL) 5-40 MG capsule, TAKE 1 CAPSULE DAILY (CHANGED DOSE), Disp: 90 capsule, Rfl: 4 .  Ascorbic Acid (VITAMIN C) 1000 MG tablet, Take 1,000 mg by mouth daily., Disp: , Rfl:  .  aspirin 81 MG tablet, Take 81 mg by mouth daily., Disp: , Rfl:  .  atorvastatin (LIPITOR) 40 MG tablet, Take 1 tablet (40 mg total) by mouth daily., Disp: 30 tablet, Rfl: 3 .  B-D INS SYR ULTRAFINE 1CC/30G 30G X 1/2" 1 ML MISC, , Disp: , Rfl:  .  Calcium Carbonate (CALCIUM 600 PO), Take 1 tablet by mouth daily., Disp: , Rfl:  .  Chlorpheniramine Maleate (ALLERGY RELIEF PO), Take 1 tablet by mouth daily., Disp: , Rfl:  .  Cholecalciferol (VITAMIN D3 PO), Take by mouth 2 (two) times daily. , Disp: , Rfl:  .  Cyanocobalamin (B-12 PO), Take by mouth daily., Disp: , Rfl:  .  ferrous sulfate 325 (65 FE) MG tablet, Take 325 mg by mouth daily with breakfast., Disp: , Rfl:  .  hydrochlorothiazide (HYDRODIURIL) 50 MG tablet, Take 1 tablet (50 mg total) by mouth daily., Disp: 90 tablet, Rfl: 4 .  insulin regular human CONCENTRATED (HUMULIN R) 500 UNIT/ML injection, Takes 40 units in the am and 50 units in the pm, Disp: 100 mL, Rfl: 4 .  levothyroxine (SYNTHROID, LEVOTHROID) 100 MCG tablet, TAKE ONE TABLET BY MOUTH ONCE DAILY, Disp:  90 tablet, Rfl: 4 .  meloxicam (MOBIC) 7.5 MG tablet, 1 tab once a day with food as needed only for severe knee pain, Disp: , Rfl:  .  metFORMIN (GLUCOPHAGE) 1000 MG tablet, TAKE ONE TABLET BY MOUTH TWICE DAILY, Disp: 180 tablet, Rfl: 4 .  metoprolol (LOPRESSOR) 50 MG tablet, TAKE 1 TABLET TWICE A DAY, Disp: 180 tablet, Rfl: 2 .  Multiple Vitamin (MULTIVITAMIN) capsule, Take 1 capsule by mouth daily., Disp: , Rfl:  .  ONE TOUCH ULTRA TEST test strip, USE 1 STRIP TWICE A DAY, Disp: 200 each, Rfl: 2 .  pantoprazole (PROTONIX) 40 MG tablet, TAKE 1 TABLET TWICE A DAY, Disp: 180 tablet, Rfl: 2 .  POTASSIUM PO, Take by mouth daily., Disp: , Rfl:  .  testosterone  cypionate (DEPOTESTOTERONE CYPIONATE) 100 MG/ML injection, Inject 1 mL (100 mg total) into the muscle every 14 (fourteen) days. For IM use only, Disp: 10 mL, Rfl: 0  Review of Systems  Constitutional: Positive for fatigue. Negative for appetite change, chills and fever.  HENT: Positive for congestion (head congestion).   Respiratory: Positive for cough (productive cough with green phlegm). Negative for chest tightness, shortness of breath and wheezing.   Cardiovascular: Negative for chest pain and palpitations.  Gastrointestinal: Negative for abdominal pain, nausea and vomiting.  Neurological: Positive for headaches.    Social History  Substance Use Topics  . Smoking status: Former Smoker    Packs/day: 1.00    Years: 15.00    Types: Cigarettes    Quit date: 05/03/1978  . Smokeless tobacco: Former Systems developer  . Alcohol use No   Objective:   BP (!) 156/72 (BP Location: Left Arm, Patient Position: Sitting, Cuff Size: Large)   Pulse (!) 55   Temp 98.8 F (37.1 C) (Oral)   Resp 16   Wt 292 lb (132.5 kg)   SpO2 94% Comment: room air  BMI 40.73 kg/m   Physical Exam  General Appearance:    Alert, cooperative, no distress  HENT:   bilateral TM normal without fluid or infection, neck without nodes, throat normal without erythema or exudate, frontal sinuses tender and nasal mucosa pale and congested  Eyes:    PERRL, conjunctiva/corneas clear, EOM's intact       Lungs:     Clear to auscultation bilaterally, respirations unlabored  Heart:    Regular rate and rhythm  Neurologic:   Awake, alert, oriented x 3. No apparent focal neurological           defect.           Assessment & Plan:     1. Thrombocytopenia (HCC)  - CBC  2. Low testosterone Tolerating testosterone injection, but no improvement with mood or energy.  - Testosterone,Free and Total - CBC  3. Type 2 diabetes mellitus with diabetic neuropathy, with long-term current use of insulin (HCC) Lab Results  Component Value Date    HGBA1C 4.8 02/17/2016    4. Hypothyroidism, unspecified type Lab Results  Component Value Date   TSH 4.440 02/17/2016     5. Hyperlipidemia, unspecified hyperlipidemia type Tolerating change back to atorvastatin without adverse effects.  - Lipid panel - Hepatic function panel  6. Upper respiratory tract infection, unspecified type Early sinusitis.  - amoxicillin (AMOXIL) 500 MG capsule; Take 2 capsules (1,000 mg total) by mouth 2 (two) times daily.  Dispense: 40 capsule; Refill: 0     The entirety of the information documented in the History of Present Illness, Review of  Systems and Physical Exam were personally obtained by me. Portions of this information were initially documented by Meyer Cory, CMA and reviewed by me for thoroughness and accuracy.    Lelon Huh, MD  [facility]

## 2016-04-19 NOTE — Patient Instructions (Signed)
Upper Respiratory Infection, Adult Most upper respiratory infections (URIs) are a viral infection of the air passages leading to the lungs. A URI affects the nose, throat, and upper air passages. The most common type of URI is nasopharyngitis and is typically referred to as "the common cold." URIs run their course and usually go away on their own. Most of the time, a URI does not require medical attention, but sometimes a bacterial infection in the upper airways can follow a viral infection. This is called a secondary infection. Sinus and middle ear infections are common types of secondary upper respiratory infections. Bacterial pneumonia can also complicate a URI. A URI can worsen asthma and chronic obstructive pulmonary disease (COPD). Sometimes, these complications can require emergency medical care and may be life threatening. What are the causes? Almost all URIs are caused by viruses. A virus is a type of germ and can spread from one person to another. What increases the risk? You may be at risk for a URI if:  You smoke.  You have chronic heart or lung disease.  You have a weakened defense (immune) system.  You are very young or very old.  You have nasal allergies or asthma.  You work in crowded or poorly ventilated areas.  You work in health care facilities or schools.  What are the signs or symptoms? Symptoms typically develop 2-3 days after you come in contact with a cold virus. Most viral URIs last 7-10 days. However, viral URIs from the influenza virus (flu virus) can last 14-18 days and are typically more severe. Symptoms may include:  Runny or stuffy (congested) nose.  Sneezing.  Cough.  Sore throat.  Headache.  Fatigue.  Fever.  Loss of appetite.  Pain in your forehead, behind your eyes, and over your cheekbones (sinus pain).  Muscle aches.  How is this diagnosed? Your health care provider may diagnose a URI by:  Physical exam.  Tests to check that your  symptoms are not due to another condition such as: ? Strep throat. ? Sinusitis. ? Pneumonia. ? Asthma.  How is this treated? A URI goes away on its own with time. It cannot be cured with medicines, but medicines may be prescribed or recommended to relieve symptoms. Medicines may help:  Reduce your fever.  Reduce your cough.  Relieve nasal congestion.  Follow these instructions at home:  Take medicines only as directed by your health care provider.  Gargle warm saltwater or take cough drops to comfort your throat as directed by your health care provider.  Use a warm mist humidifier or inhale steam from a shower to increase air moisture. This may make it easier to breathe.  Drink enough fluid to keep your urine clear or pale yellow.  Eat soups and other clear broths and maintain good nutrition.  Rest as needed.  Return to work when your temperature has returned to normal or as your health care provider advises. You may need to stay home longer to avoid infecting others. You can also use a face mask and careful hand washing to prevent spread of the virus.  Increase the usage of your inhaler if you have asthma.  Do not use any tobacco products, including cigarettes, chewing tobacco, or electronic cigarettes. If you need help quitting, ask your health care provider. How is this prevented? The best way to protect yourself from getting a cold is to practice good hygiene.  Avoid oral or hand contact with people with cold symptoms.  Wash your   hands often if contact occurs.  There is no clear evidence that vitamin C, vitamin E, echinacea, or exercise reduces the chance of developing a cold. However, it is always recommended to get plenty of rest, exercise, and practice good nutrition. Contact a health care provider if:  You are getting worse rather than better.  Your symptoms are not controlled by medicine.  You have chills.  You have worsening shortness of breath.  You have  brown or red mucus.  You have yellow or brown nasal discharge.  You have pain in your face, especially when you bend forward.  You have a fever.  You have swollen neck glands.  You have pain while swallowing.  You have white areas in the back of your throat. Get help right away if:  You have severe or persistent: ? Headache. ? Ear pain. ? Sinus pain. ? Chest pain.  You have chronic lung disease and any of the following: ? Wheezing. ? Prolonged cough. ? Coughing up blood. ? A change in your usual mucus.  You have a stiff neck.  You have changes in your: ? Vision. ? Hearing. ? Thinking. ? Mood. This information is not intended to replace advice given to you by your health care provider. Make sure you discuss any questions you have with your health care provider. Document Released: 10/13/2000 Document Revised: 12/21/2015 Document Reviewed: 07/25/2013 Elsevier Interactive Patient Education  2017 [employer]  

## 2016-04-20 ENCOUNTER — Telehealth: Payer: Self-pay

## 2016-04-20 LAB — LIPID PANEL
CHOLESTEROL TOTAL: 112 mg/dL (ref 100–199)
Chol/HDL Ratio: 4.3 ratio units (ref 0.0–5.0)
HDL: 26 mg/dL — ABNORMAL LOW (ref >39)
LDL Calculated: 20 mg/dL (ref 0–99)
Triglycerides: 331 mg/dL — ABNORMAL HIGH (ref 0–149)
VLDL CHOLESTEROL CAL: 66 mg/dL — AB (ref 5–40)

## 2016-04-20 LAB — CBC
HEMATOCRIT: 34.7 % — AB (ref 37.5–51.0)
HEMOGLOBIN: 11.2 g/dL — AB (ref 13.0–17.7)
MCH: 29.2 pg (ref 26.6–33.0)
MCHC: 32.3 g/dL (ref 31.5–35.7)
MCV: 90 fL (ref 79–97)
Platelets: 106 10*3/uL — ABNORMAL LOW (ref 150–379)
RBC: 3.84 x10E6/uL — AB (ref 4.14–5.80)
RDW: 15 % (ref 12.3–15.4)
WBC: 8.4 10*3/uL (ref 3.4–10.8)

## 2016-04-20 LAB — TESTOSTERONE,FREE AND TOTAL
TESTOSTERONE FREE: 12.4 pg/mL (ref 7.2–24.0)
TESTOSTERONE: 298 ng/dL (ref 264–916)

## 2016-04-20 LAB — HEPATIC FUNCTION PANEL
ALBUMIN: 4 g/dL (ref 3.5–5.5)
ALK PHOS: 85 IU/L (ref 39–117)
ALT: 23 IU/L (ref 0–44)
AST: 22 IU/L (ref 0–40)
BILIRUBIN TOTAL: 0.5 mg/dL (ref 0.0–1.2)
Bilirubin, Direct: 0.16 mg/dL (ref 0.00–0.40)
Total Protein: 6.6 g/dL (ref 6.0–8.5)

## 2016-04-20 NOTE — Telephone Encounter (Signed)
Patient advised as below. Patient verbalizes understanding and is in agreement with treatment plan.  

## 2016-04-20 NOTE — Telephone Encounter (Signed)
-----   Message from [name], MD sent at 04/20/2016  2:02 PM EST ----- Triglycerides are much better, down from 667 to 331. Continue current dose of atorvastatin. Testosterone levels are better, but still low. Increase testosterone to 2 (two) mls injection every 2 weeks. Follow up to check testosterone levels in 3 months.

## 2016-04-26 DIAGNOSIS — G4733 Obstructive sleep apnea (adult) (pediatric): Secondary | ICD-10-CM | POA: Diagnosis not present

## 2016-05-06 ENCOUNTER — Other Ambulatory Visit: Payer: Self-pay | Admitting: *Deleted

## 2016-05-06 ENCOUNTER — Encounter: Payer: Self-pay | Admitting: Gastroenterology

## 2016-05-06 ENCOUNTER — Ambulatory Visit (INDEPENDENT_AMBULATORY_CARE_PROVIDER_SITE_OTHER): Payer: BLUE CROSS/BLUE SHIELD | Admitting: Gastroenterology

## 2016-05-06 VITALS — BP 149/76 | HR 59 | Resp 20 | Ht 71.0 in | Wt 289.0 lb

## 2016-05-06 DIAGNOSIS — K760 Fatty (change of) liver, not elsewhere classified: Secondary | ICD-10-CM

## 2016-05-06 NOTE — Patient Instructions (Addendum)
Nonalcoholic Fatty Liver Disease Diet Introduction Nonalcoholic fatty liver disease is a condition that causes fat to accumulate in and around the liver. The disease makes it harder for the liver to work the way that it should. Following a healthy diet can help to keep nonalcoholic fatty liver disease under control. It can also help to prevent or improve conditions that are associated with the disease, such as heart disease, diabetes, high blood pressure, and abnormal cholesterol levels. Along with regular exercise, this diet:  Promotes weight loss.  Helps to control blood sugar levels.  Helps to improve the way that the body uses insulin. What do I need to know about this diet?  Use the glycemic index (GI) to plan your meals. The index tells you how quickly a food will raise your blood sugar. Choose low-GI foods. These foods take a longer time to raise blood sugar.  Keep track of how many calories you take in. Eating the right amount of calories will help you to achieve a healthy weight.  You may want to follow a Mediterranean diet. This diet includes a lot of vegetables, lean meats or fish, whole grains, fruits, and healthy oils and fats. What foods can I eat? Grains  Whole grains, such as whole-wheat or whole-grain breads, crackers, tortillas, cereals, and pasta. Stone-ground whole wheat. Pumpernickel bread. Unsweetened oatmeal. Bulgur. Barley. Quinoa. Brown or wild rice. Corn or whole-wheat flour tortillas. Vegetables  Lettuce. Spinach. Peas. Beets. Cauliflower. Cabbage. Broccoli. Carrots. Tomatoes. Squash. Eggplant. Herbs. Peppers. Onions. Cucumbers. Brussels sprouts. Yams and sweet potatoes. Beans. Lentils. Fruits  Bananas. Apples. Oranges. Grapes. Papaya. Mango. Pomegranate. Kiwi. Grapefruit. Cherries. Meats and Other Protein Sources  Seafood and shellfish. Lean meats. Poultry. Tofu. Dairy  Low-fat or fat-free dairy products, such as yogurt, cottage cheese, and cheese. Beverages   Water. Sugar-free drinks. Tea. Coffee. Low-fat or skim milk. Milk alternatives, such as soy or almond milk. Real fruit juice. Condiments  Mustard. Relish. Low-fat, low-sugar ketchup and barbecue sauce. Low-fat or fat-free mayonnaise. Sweets and Desserts  Sugar-free sweets. Fats and Oils  Avocado. Canola or olive oil. Nuts and nut butters. Seeds. The items listed above may not be a complete list of recommended foods or beverages. Contact your dietitian for more options.  What foods are not recommended? Palm oil and coconut oil. Processed foods. Fried foods. Sweetened drinks, such as sweet tea, milkshakes, snow cones, iced sweet drinks, and sodas. Alcohol. Sweets. Foods that contain a lot of salt or sodium. The items listed above may not be a complete list of foods and beverages to avoid. Contact your dietitian for more information.  This information is not intended to replace advice given to you by your health care provider. Make sure you discuss any questions you have with your health care provider. Document Released: 09/03/2014 Document Revised: 09/25/2015 Document Reviewed: 05/14/2014  2017 Elsevier

## 2016-05-06 NOTE — Progress Notes (Signed)
Primary Care Physician: Lelon Huh, MD  Primary Gastroenterologist:  Dr. Lucilla Lame  No chief complaint on file.   HPI: Patrick Hurst is a 61 y.o. male here for follow-up of his abnormal liver imaging. The patient had a nodular liver found on imaging and underwent a liver biopsy which showed stage III fibrosis with moderate inflammation. The patient had no signs of cirrhosis on the biopsy but this could have been sampling error with his other findings suggestive of cirrhosis. The patient has thrombocytopenia although his albumin is normal. Patient has had normal liver enzymes in the past and hypertriglyceridemia with obesity. The patient's liver biopsies were most consistent with nonalcoholic fatty liver disease. The patient reports that he has lost weight since last being seen.  Current Outpatient Prescriptions  Medication Sig Dispense Refill  . albuterol (PROVENTIL HFA;VENTOLIN HFA) 108 (90 Base) MCG/ACT inhaler Inhale 2 puffs into the lungs every 4 (four) hours as needed for wheezing or shortness of breath. 1 Inhaler 2  . allopurinol (ZYLOPRIM) 300 MG tablet Take 300 mg by mouth daily as needed. Reported on 10/22/2015    . amLODipine-benazepril (LOTREL) 5-40 MG capsule TAKE 1 CAPSULE DAILY (CHANGED DOSE) 90 capsule 4  . Ascorbic Acid (VITAMIN C) 1000 MG tablet Take 1,000 mg by mouth daily.    Marland Kitchen aspirin 81 MG tablet Take 81 mg by mouth daily.    Marland Kitchen atorvastatin (LIPITOR) 40 MG tablet Take 1 tablet (40 mg total) by mouth daily. 30 tablet 3  . B-D INS SYR ULTRAFINE 1CC/30G 30G X 1/2" 1 ML MISC     . Calcium Carbonate (CALCIUM 600 PO) Take 1 tablet by mouth daily.    Marland Kitchen CETIRIZINE HCL PO Take by mouth.    . Chlorpheniramine Maleate (ALLERGY RELIEF PO) Take 1 tablet by mouth daily.    . Cholecalciferol (VITAMIN D3 PO) Take by mouth 2 (two) times daily.     . colestipol (COLESTID) 1 g tablet Take by mouth.    . Cyanocobalamin (B-12 PO) Take by mouth daily.    . ferrous sulfate 325 (65 FE)  MG tablet Take 325 mg by mouth daily with breakfast.    . hydrochlorothiazide (HYDRODIURIL) 50 MG tablet Take 1 tablet (50 mg total) by mouth daily. 90 tablet 4  . insulin regular human CONCENTRATED (HUMULIN R) 500 UNIT/ML injection Takes 40 units in the am and 50 units in the pm 100 mL 4  . levothyroxine (SYNTHROID, LEVOTHROID) 100 MCG tablet TAKE ONE TABLET BY MOUTH ONCE DAILY 90 tablet 4  . meloxicam (MOBIC) 7.5 MG tablet 1 tab once a day with food as needed only for severe knee pain    . metFORMIN (GLUCOPHAGE) 1000 MG tablet TAKE ONE TABLET BY MOUTH TWICE DAILY 180 tablet 4  . metoprolol (LOPRESSOR) 50 MG tablet TAKE 1 TABLET TWICE A DAY 180 tablet 2  . Multiple Vitamin (MULTIVITAMIN) capsule Take 1 capsule by mouth daily.    . Omega-3 Fatty Acids (FISH OIL PO) Take by mouth.    . ONE TOUCH ULTRA TEST test strip USE 1 STRIP TWICE A DAY 200 each 2  . pantoprazole (PROTONIX) 40 MG tablet TAKE 1 TABLET TWICE A DAY 180 tablet 2  . POTASSIUM PO Take by mouth daily.    Marland Kitchen testosterone cypionate (DEPOTESTOTERONE CYPIONATE) 100 MG/ML injection Inject 1 mL (100 mg total) into the muscle every 14 (fourteen) days. For IM use only 10 mL 0   No current facility-administered medications for this visit.  Allergies as of 05/06/2016 - Review Complete 05/06/2016  Allergen Reaction Noted  . Mucinex  [guaifenesin er]  02/12/2015  . Levaquin [levofloxacin] Palpitations 05/14/2015    ROS:  General: Negative for anorexia, weight loss, fever, chills, fatigue, weakness. ENT: Negative for hoarseness, difficulty swallowing , nasal congestion. CV: Negative for chest pain, angina, palpitations, dyspnea on exertion, peripheral edema.  Respiratory: Negative for dyspnea at rest, dyspnea on exertion, cough, sputum, wheezing.  GI: See history of present illness. GU:  Negative for dysuria, hematuria, urinary incontinence, urinary frequency, nocturnal urination.  Endo: Negative for unusual weight change.      Physical Examination:   There were no vitals taken for this visit.  General: Well-nourished, well-developed in no acute distress.  Eyes: No icterus. Conjunctivae pink. Mouth: Oropharyngeal mucosa moist and pink , no lesions erythema or exudate. Lungs: Clear to auscultation bilaterally. Non-labored. Heart: Regular rate and rhythm, no murmurs rubs or gallops.  Abdomen: Bowel sounds are normal, nontender, nondistended, no hepatosplenomegaly or masses, no abdominal bruits or hernia , no rebound or guarding.   Extremities: No lower extremity edema. No clubbing or deformities. Neuro: Alert and oriented x 3.  Grossly intact. Skin: Warm and dry, no jaundice.   Psych: Alert and cooperative, normal mood and affect.  Labs:    Imaging Studies: No results found.  Assessment and Plan:   [name] is a 61 y.o. y/o male with stage III fibrosis and moderate inflammation and a diagnosis of fatty liver disease. The patient will also have his blood sent off for alpha-fetoprotein, liver enzymes and right upper quadrant ultrasound.The patient will continue to try to lose weight and follow up in 6 months.  Life-style modification to include routine physical activity of at least 20-30 minutes daily as well as dietary modification was discussed. Specifically a Mediterranean style diet which is composed of fish, white meat, fresh fruit, vegetable, whole grains, and legumes was recommended. Foods enriched with excess refined sugars, such as high fructose corn syrup, sweetened beveraages and hydrogenated fats ( i,e. the "Western Diet") should be avoided. A higher protein, lower carbohydrate, lower fat diet is ideal in decreasing excess hydrogenated fats and complex sugars. Coffee consumption has been proven to be beneficial in improving liver injury. Routine physical activity which results in 3-5% weight loss improves insulin resistance and hepatic steatosis. Weight loss of 5-10% may improve nonalcoholic  steatohepatisis or even hepatic fibrosis. Both diet and exercise are the foundation of treatment for patients with NAFLD.       Lucilla Lame, MD. Marval Regal   Note: This dictation was prepared with Dragon dictation along with smaller phrase technology. Any transcriptional errors that result from this process are unintentional.

## 2016-05-07 LAB — HEPATIC FUNCTION PANEL
ALT: 21 IU/L (ref 0–44)
AST: 26 IU/L (ref 0–40)
Albumin: 3.9 g/dL (ref 3.5–5.5)
Alkaline Phosphatase: 78 IU/L (ref 39–117)
BILIRUBIN TOTAL: 0.4 mg/dL (ref 0.0–1.2)
Bilirubin, Direct: 0.14 mg/dL (ref 0.00–0.40)
Total Protein: 6.3 g/dL (ref 6.0–8.5)

## 2016-05-07 LAB — AFP TUMOR MARKER: AFP-Tumor Marker: 1.2 ng/mL (ref 0.0–8.3)

## 2016-05-10 ENCOUNTER — Ambulatory Visit
Admission: RE | Admit: 2016-05-10 | Discharge: 2016-05-10 | Disposition: A | Discharge status: Home / Self Care | Payer: BLUE CROSS/BLUE SHIELD | Source: Ambulatory Visit | Attending: Gastroenterology | Admitting: Gastroenterology

## 2016-05-10 DIAGNOSIS — K760 Fatty (change of) liver, not elsewhere classified: Secondary | ICD-10-CM | POA: Insufficient documentation

## 2016-05-10 DIAGNOSIS — Z9049 Acquired absence of other specified parts of digestive tract: Secondary | ICD-10-CM | POA: Diagnosis not present

## 2016-05-10 DIAGNOSIS — K7460 Unspecified cirrhosis of liver: Secondary | ICD-10-CM | POA: Insufficient documentation

## 2016-05-17 ENCOUNTER — Telehealth: Payer: Self-pay

## 2016-05-17 NOTE — Telephone Encounter (Signed)
-----   Message from Lucilla Lame, MD sent at 05/10/2016 12:34 PM EST ----- The patient know that his ultrasound showed no changes from his previous ultrasound nor did show any masses or cancers.

## 2016-05-17 NOTE — Telephone Encounter (Signed)
-----   Message from Lucilla Lame, MD sent at 05/07/2016  7:31 AM EST ----- Let the patient now that AFP was normal.

## 2016-05-17 NOTE — Telephone Encounter (Signed)
Pt notified of labs and [place] results.

## 2016-05-24 ENCOUNTER — Other Ambulatory Visit: Payer: Self-pay | Admitting: Family Medicine

## 2016-05-24 DIAGNOSIS — R7989 Other specified abnormal findings of blood chemistry: Secondary | ICD-10-CM

## 2016-05-24 NOTE — Telephone Encounter (Signed)
Please call in testosterone

## 2016-05-25 NOTE — Telephone Encounter (Signed)
Rx called in to pharmacy. 

## 2016-05-27 ENCOUNTER — Telehealth: Payer: Self-pay | Admitting: Family Medicine

## 2016-05-27 DIAGNOSIS — R7989 Other specified abnormal findings of blood chemistry: Secondary | ICD-10-CM

## 2016-05-27 DIAGNOSIS — G4733 Obstructive sleep apnea (adult) (pediatric): Secondary | ICD-10-CM | POA: Diagnosis not present

## 2016-05-27 MED ORDER — TESTOSTERONE CYPIONATE 200 MG/ML IM SOLN: 200.0000 mg | INTRAMUSCULAR | 5 refills | Status: DC

## 2016-05-27 NOTE — Telephone Encounter (Signed)
Patient called stating that his prescription for Testosterone 100mg / ml injection was supposed to have been increased to 53ml. Patient went to the pharmacy and the prescription still had 57ml. I verified in patients chart that the dose was increased from 46ml to 71ml. I contacted the pharmacy and advised them of the dosage change. The pharmacist says that this medication comes in a 200mg /ml dose. Dr. Caryn Section ok'd to change prescription to 200mg / ml with patient injecting 75ml every 2 weeks. Patient was advised of this change and verified understanding.

## 2016-05-27 NOTE — Telephone Encounter (Signed)
error 

## 2016-06-16 ENCOUNTER — Other Ambulatory Visit: Payer: Self-pay | Admitting: Family Medicine

## 2016-06-25 ENCOUNTER — Other Ambulatory Visit: Payer: Self-pay | Admitting: Family Medicine

## 2016-07-19 ENCOUNTER — Encounter: Payer: Self-pay | Admitting: Family Medicine

## 2016-07-19 ENCOUNTER — Ambulatory Visit (INDEPENDENT_AMBULATORY_CARE_PROVIDER_SITE_OTHER): Payer: BLUE CROSS/BLUE SHIELD | Admitting: Family Medicine

## 2016-07-19 VITALS — BP 160/92 | HR 58 | Temp 98.1°F | Resp 16 | Wt 307.0 lb

## 2016-07-19 DIAGNOSIS — E1140 Type 2 diabetes mellitus with diabetic neuropathy, unspecified: Secondary | ICD-10-CM

## 2016-07-19 DIAGNOSIS — J40 Bronchitis, not specified as acute or chronic: Secondary | ICD-10-CM | POA: Diagnosis not present

## 2016-07-19 DIAGNOSIS — D696 Thrombocytopenia, unspecified: Secondary | ICD-10-CM | POA: Diagnosis not present

## 2016-07-19 DIAGNOSIS — E349 Endocrine disorder, unspecified: Secondary | ICD-10-CM

## 2016-07-19 DIAGNOSIS — E039 Hypothyroidism, unspecified: Secondary | ICD-10-CM

## 2016-07-19 DIAGNOSIS — Z794 Long term (current) use of insulin: Secondary | ICD-10-CM

## 2016-07-19 DIAGNOSIS — R0609 Other forms of dyspnea: Secondary | ICD-10-CM

## 2016-07-19 DIAGNOSIS — R0600 Dyspnea, unspecified: Secondary | ICD-10-CM

## 2016-07-19 DIAGNOSIS — R7989 Other specified abnormal findings of blood chemistry: Secondary | ICD-10-CM

## 2016-07-19 LAB — POCT GLYCOSYLATED HEMOGLOBIN (HGB A1C)
Est. average glucose Bld gHb Est-mCnc: 123
Hemoglobin A1C: 5.9

## 2016-07-19 MED ORDER — AZITHROMYCIN 250 MG PO TABS: ORAL_TABLET | ORAL | 0 refills | Status: AC

## 2016-07-19 MED ORDER — INSULIN REGULAR HUMAN (CONC) 500 UNIT/ML ~~LOC~~ SOLN: SUBCUTANEOUS | 4 refills | Status: DC

## 2016-07-19 MED ORDER — ALBUTEROL SULFATE HFA 108 (90 BASE) MCG/ACT IN AERS: 2.0000 | INHALATION_SPRAY | RESPIRATORY_TRACT | 2 refills | Status: DC | PRN

## 2016-07-19 NOTE — Progress Notes (Signed)
Patient: Patrick Hurst Male    DOB: 25-Oct-1955   61 y.o.   MRN: [id number] Visit Date: 07/19/2016  Today's Provider: Lelon Huh, MD   Chief Complaint  Patient presents with  . Diabetes  . Hypothyroidism  . Hyperlipidemia   Subjective:    HPI  Diabetes Mellitus Type II, Follow-up:   Lab Results  Component Value Date   HGBA1C 4.8 02/17/2016   HGBA1C 5.6 07/14/2015   HGBA1C 5.7 (H) 02/13/2015    Last seen for diabetes 3 months ago.  Management since then includes no changes. He reports good compliance with treatment. He is not having side effects.  Current symptoms include paresthesia of the feet, polydipsia and polyuria and have been stable. Home blood sugar records: fasting range: 150-200  Episodes of hypoglycemia? yes - occasionally   Current Insulin Regimen: Humulin 30 units twice a day Most Recent Eye Exam: >1 year ago Weight trend: fluctuating a bit Prior visit with dietician: no Current diet: in general, a "healthy" diet   Current exercise: walking  Pertinent Labs:    Component Value Date/Time   CHOL 112 04/19/2016 0850   CHOL 180 03/14/2012 0321   TRIG 331 (H) 04/19/2016 0850   TRIG 594 (H) 03/14/2012 0321   HDL 26 (L) 04/19/2016 0850   HDL 21 (L) 03/14/2012 0321   LDLCALC 20 04/19/2016 0850   LDLCALC SEE COMMENT 03/14/2012 0321   CREATININE 1.53 (H) 07/14/2015 0855   CREATININE 0.95 05/04/2013 1240    Wt Readings from Last 3 Encounters:  05/06/16 289 lb (131.1 kg)  04/19/16 292 lb (132.5 kg)  02/16/16 292 lb (132.5 kg)    ------------------------------------------------------------------------ Follow up of Hypothyroidism:  Patient was last seen for this problem 3 months ago and no changes were made. Patient reports good compliance with treatment and good tolerance.    Lipid/Cholesterol, Follow-up:   Last seen for this3 months ago.  Management changes since that visit include none. . Last Lipid Panel:    Component Value  Date/Time   CHOL 112 04/19/2016 0850   CHOL 180 03/14/2012 0321   TRIG 331 (H) 04/19/2016 0850   TRIG 594 (H) 03/14/2012 0321   HDL 26 (L) 04/19/2016 0850   HDL 21 (L) 03/14/2012 0321   CHOLHDL 4.3 04/19/2016 0850   VLDL SEE COMMENT 03/14/2012 0321   LDLCALC 20 04/19/2016 0850   LDLCALC SEE COMMENT 03/14/2012 0321    Risk factors for vascular disease include diabetes mellitus and hypercholesterolemia   He reports good compliance with treatment. He is not having side effects.  Current symptoms include paresthesia of the feet, polydipsia and polyuria and have been stable. Weight trend: fluctuating a bit Prior visit with dietician: no Current diet: in general, a "healthy" diet   Current exercise: walking  Wt Readings from Last 3 Encounters:  05/06/16 289 lb (131.1 kg)  04/19/16 292 lb (132.5 kg)  02/16/16 292 lb (132.5 kg)    ------------------------------------------------------------------- Follow up Low Testosterone:  Patient was last seen for this problem 3 months ago. Changes made during that visit includes increaseing Testosterone injections to 45ml every 2 weeks. Patient reports good compliance with treatment, good tolerance and good symptom control.   Also complains of persistent cough sometimes productive yellow sputum the last couple of weeks. Having a little more wheezing and shortness of breath than usual.     Allergies  Allergen Reactions  . Mucinex  [Guaifenesin Er]     Throat swelling, increases heart  rate  . Levaquin [Levofloxacin] Palpitations     Current Outpatient Prescriptions:  .  albuterol (PROVENTIL HFA;VENTOLIN HFA) 108 (90 Base) MCG/ACT inhaler, Inhale 2 puffs into the lungs every 4 (four) hours as needed for wheezing or shortness of breath., Disp: 1 Inhaler, Rfl: 2 .  allopurinol (ZYLOPRIM) 300 MG tablet, Take 300 mg by mouth daily as needed. Reported on 10/22/2015, Disp: , Rfl:  .  amLODipine-benazepril (LOTREL) 5-40 MG capsule, TAKE 1 CAPSULE  DAILY (CHANGED DOSE), Disp: 90 capsule, Rfl: 4 .  Ascorbic Acid (VITAMIN C) 1000 MG tablet, Take 1,000 mg by mouth daily., Disp: , Rfl:  .  aspirin 81 MG tablet, Take 81 mg by mouth daily., Disp: , Rfl:  .  atorvastatin (LIPITOR) 40 MG tablet, TAKE ONE TABLET BY MOUTH ONCE DAILY, Disp: 90 tablet, Rfl: 4 .  B-D INS SYR ULTRAFINE 1CC/30G 30G X 1/2" 1 ML MISC, , Disp: , Rfl:  .  Calcium Carbonate (CALCIUM 600 PO), Take 1 tablet by mouth daily., Disp: , Rfl:  .  CETIRIZINE HCL PO, Take by mouth., Disp: , Rfl:  .  Chlorpheniramine Maleate (ALLERGY RELIEF PO), Take 1 tablet by mouth daily., Disp: , Rfl:  .  Cholecalciferol (VITAMIN D3 PO), Take by mouth 2 (two) times daily. , Disp: , Rfl:  .  colestipol (COLESTID) 1 g tablet, Take by mouth., Disp: , Rfl:  .  Cyanocobalamin (B-12 PO), Take by mouth daily., Disp: , Rfl:  .  ferrous sulfate 325 (65 FE) MG tablet, Take 325 mg by mouth daily with breakfast., Disp: , Rfl:  .  hydrochlorothiazide (HYDRODIURIL) 50 MG tablet, Take 1 tablet (50 mg total) by mouth daily., Disp: 90 tablet, Rfl: 4 .  insulin regular human CONCENTRATED (HUMULIN R) 500 UNIT/ML injection, Takes 40 units in the am and 50 units in the pm, Disp: 100 mL, Rfl: 4 .  levothyroxine (SYNTHROID, LEVOTHROID) 100 MCG tablet, TAKE ONE TABLET BY MOUTH ONCE DAILY, Disp: 90 tablet, Rfl: 4 .  meloxicam (MOBIC) 7.5 MG tablet, 1 tab once a day with food as needed only for severe knee pain, Disp: , Rfl:  .  metFORMIN (GLUCOPHAGE) 1000 MG tablet, TAKE ONE TABLET BY MOUTH TWICE DAILY, Disp: 180 tablet, Rfl: 4 .  metoprolol (LOPRESSOR) 50 MG tablet, TAKE 1 TABLET TWICE A DAY, Disp: 180 tablet, Rfl: 2 .  Multiple Vitamin (MULTIVITAMIN) capsule, Take 1 capsule by mouth daily., Disp: , Rfl:  .  Omega-3 Fatty Acids (FISH OIL PO), Take by mouth., Disp: , Rfl:  .  ONE TOUCH ULTRA TEST test strip, USE 1 STRIP TWICE A DAY, Disp: 200 each, Rfl: 4 .  pantoprazole (PROTONIX) 40 MG tablet, TAKE 1 TABLET TWICE A DAY,  Disp: 180 tablet, Rfl: 4 .  POTASSIUM PO, Take by mouth daily., Disp: , Rfl:  .  testosterone cypionate (DEPO-TESTOSTERONE) 200 MG/ML injection, Inject 1 mL (200 mg total) into the muscle every 14 (fourteen) days., Disp: 10 mL, Rfl: 5  Review of Systems  Constitutional: Negative for appetite change, chills and fever.  Respiratory: Positive for cough (productive with green sputum), shortness of breath and wheezing. Negative for chest tightness.   Cardiovascular: Negative for chest pain and palpitations.  Gastrointestinal: Negative for abdominal pain, nausea and vomiting.    Social History  Substance Use Topics  . Smoking status: Former Smoker    Packs/day: 1.00    Years: 15.00    Types: Cigarettes    Quit date: 05/03/1978  . Smokeless tobacco:  Former Systems developer     Comment: started smoking at age 13  . Alcohol use No   Objective:   BP (!) 160/92 (BP Location: Left Arm, Patient Position: Sitting, Cuff Size: Large)   Pulse (!) 58   Temp 98.1 F (36.7 C) (Oral)   Resp 16   Wt (!) 307 lb (139.3 kg)   SpO2 90% Comment: room air  BMI 42.82 kg/m  There were no vitals filed for this visit.   Physical Exam  General Appearance:    Alert, cooperative, no distress, obese  Eyes:    PERRL, conjunctiva/corneas clear, EOM's intact       Lungs:     Occasional expiratory wheeze, no rales, , respirations unlabored  Heart:    Regular rate and rhythm  Neurologic:   Awake, alert, oriented x 3. No apparent focal neurological           defect.         Results for orders placed or performed in visit on 07/19/16  POCT HgB A1C  Result Value Ref Range   Hemoglobin A1C 5.9    Est. average glucose Bld gHb Est-mCnc 123        Assessment & Plan:     1. Type 2 diabetes mellitus with diabetic neuropathy, with long-term current use of insulin (HCC) Having occasionally hypoglycemia, will reduce humulin from 30 to 25 BID - POCT HgB A1C - insulin regular human CONCENTRATED (HUMULIN R) 500 UNIT/ML  injection; Takes 25 units in the am and 25 units in the pm  Dispense: 10 mL; Refill: 4 - Comprehensive metabolic panel  2. Bronchitis  - azithromycin (ZITHROMAX) 250 MG tablet; 2 by mouth today, then 1 daily for 4 days  Dispense: 6 tablet; Refill: 0  3. Low testosterone  - Testosterone,Free and Total  4. Hypothyroidism, unspecified type Lab Results  Component Value Date   TSH 4.440 02/17/2016    5. Thrombocytopenia (HCC)  - CBC  6. Dyspnea on exertion  - albuterol (PROVENTIL HFA;VENTOLIN HFA) 108 (90 Base) MCG/ACT inhaler; Inhale 2 puffs into the lungs every 4 (four) hours as needed for wheezing or shortness of breath.  Dispense: 1 Inhaler; Refill: 2       Lelon Huh, MD  [facility]

## 2016-07-19 NOTE — Patient Instructions (Signed)
Call if your cough is no much better by the end of the week, or it it gets worse or you start running fever

## 2016-07-20 ENCOUNTER — Telehealth: Payer: Self-pay

## 2016-07-20 LAB — COMPREHENSIVE METABOLIC PANEL
A/G RATIO: 1.3 (ref 1.2–2.2)
ALBUMIN: 3.5 g/dL (ref 3.5–5.5)
ALK PHOS: 81 IU/L (ref 39–117)
ALT: 22 IU/L (ref 0–44)
AST: 24 IU/L (ref 0–40)
BILIRUBIN TOTAL: 0.5 mg/dL (ref 0.0–1.2)
BUN/Creatinine Ratio: 16 (ref 9–20)
BUN: 30 mg/dL — ABNORMAL HIGH (ref 6–24)
CHLORIDE: 104 mmol/L (ref 96–106)
CO2: 27 mmol/L (ref 18–29)
Calcium: 8.4 mg/dL — ABNORMAL LOW (ref 8.7–10.2)
Creatinine, Ser: 1.84 mg/dL — ABNORMAL HIGH (ref 0.76–1.27)
GFR calc non Af Amer: 39 mL/min/{1.73_m2} — ABNORMAL LOW (ref >59)
GFR, EST AFRICAN AMERICAN: 45 mL/min/{1.73_m2} — AB (ref >59)
GLOBULIN, TOTAL: 2.6 g/dL (ref 1.5–4.5)
Glucose: 179 mg/dL — ABNORMAL HIGH (ref 65–99)
POTASSIUM: 4.9 mmol/L (ref 3.5–5.2)
SODIUM: 144 mmol/L (ref 134–144)
TOTAL PROTEIN: 6.1 g/dL (ref 6.0–8.5)

## 2016-07-20 LAB — CBC
HEMOGLOBIN: 10.8 g/dL — AB (ref 13.0–17.7)
Hematocrit: 33.7 % — ABNORMAL LOW (ref 37.5–51.0)
MCH: 28.5 pg (ref 26.6–33.0)
MCHC: 32 g/dL (ref 31.5–35.7)
MCV: 89 fL (ref 79–97)
Platelets: 100 10*3/uL — CL (ref 150–379)
RBC: 3.79 x10E6/uL — AB (ref 4.14–5.80)
RDW: 15.5 % — ABNORMAL HIGH (ref 12.3–15.4)
WBC: 7.1 10*3/uL (ref 3.4–10.8)

## 2016-07-20 LAB — TESTOSTERONE,FREE AND TOTAL
Testosterone, Free: 12.5 pg/mL (ref 7.2–24.0)
Testosterone: 370 ng/dL (ref 264–916)

## 2016-07-20 NOTE — Telephone Encounter (Signed)
Patient advised as directed below. Scheduled patient July 21,2018 at [facility].  Thanks,  -Joseline

## 2016-07-20 NOTE — Telephone Encounter (Signed)
-----   Message from [name], MD sent at 07/20/2016  7:58 AM EDT ----- Kidney functions slightly impaired, need to drink more water. Testosterone levels are good. Continue current medications.  Schedule follow up for diabetes in 4 months.

## 2016-08-03 ENCOUNTER — Other Ambulatory Visit: Payer: Self-pay | Admitting: *Deleted

## 2016-08-03 DIAGNOSIS — E785 Hyperlipidemia, unspecified: Secondary | ICD-10-CM

## 2016-08-03 MED ORDER — ATORVASTATIN CALCIUM 40 MG PO TABS: 40.0000 mg | ORAL_TABLET | Freq: Every day | ORAL | 4 refills | Status: DC

## 2016-08-03 NOTE — Telephone Encounter (Signed)
Rx being sent to new pharmacy.

## 2016-09-16 ENCOUNTER — Other Ambulatory Visit: Payer: Self-pay | Admitting: Family Medicine

## 2016-09-24 DIAGNOSIS — E089 Diabetes mellitus due to underlying condition without complications: Secondary | ICD-10-CM | POA: Diagnosis not present

## 2016-09-24 DIAGNOSIS — E113492 Type 2 diabetes mellitus with severe nonproliferative diabetic retinopathy without macular edema, left eye: Secondary | ICD-10-CM | POA: Diagnosis not present

## 2016-09-24 DIAGNOSIS — E113391 Type 2 diabetes mellitus with moderate nonproliferative diabetic retinopathy without macular edema, right eye: Secondary | ICD-10-CM | POA: Diagnosis not present

## 2016-09-24 DIAGNOSIS — Z961 Presence of intraocular lens: Secondary | ICD-10-CM | POA: Diagnosis not present

## 2016-10-19 ENCOUNTER — Other Ambulatory Visit: Payer: Self-pay | Admitting: Family Medicine

## 2016-10-19 DIAGNOSIS — M170 Bilateral primary osteoarthritis of knee: Secondary | ICD-10-CM | POA: Diagnosis not present

## 2016-10-19 DIAGNOSIS — L409 Psoriasis, unspecified: Secondary | ICD-10-CM | POA: Insufficient documentation

## 2016-10-19 DIAGNOSIS — D696 Thrombocytopenia, unspecified: Secondary | ICD-10-CM | POA: Diagnosis not present

## 2016-10-19 DIAGNOSIS — Z794 Long term (current) use of insulin: Principal | ICD-10-CM

## 2016-10-19 DIAGNOSIS — E1140 Type 2 diabetes mellitus with diabetic neuropathy, unspecified: Secondary | ICD-10-CM

## 2016-10-19 MED ORDER — INSULIN REGULAR HUMAN (CONC) 500 UNIT/ML ~~LOC~~ SOLN: SUBCUTANEOUS | 3 refills | Status: DC

## 2016-10-19 NOTE — Telephone Encounter (Signed)
Pt contacted office for refill request on the following medications:  insulin regular human CONCENTRATED (HUMULIN R) 500 UNIT/ML injection.  [facility].  3 month supply.  LF#[phone]

## 2016-11-01 DIAGNOSIS — E113293 Type 2 diabetes mellitus with mild nonproliferative diabetic retinopathy without macular edema, bilateral: Secondary | ICD-10-CM | POA: Diagnosis not present

## 2016-11-01 LAB — HM DIABETES EYE EXAM

## 2016-11-02 ENCOUNTER — Encounter: Payer: Self-pay | Admitting: *Deleted

## 2016-11-09 ENCOUNTER — Encounter: Payer: Self-pay | Admitting: Family Medicine

## 2016-11-11 ENCOUNTER — Other Ambulatory Visit: Payer: Self-pay | Admitting: Family Medicine

## 2016-11-11 DIAGNOSIS — R7989 Other specified abnormal findings of blood chemistry: Secondary | ICD-10-CM

## 2016-11-11 NOTE — Telephone Encounter (Signed)
[facility] faxed a request on the following medication. Thanks CC  testosterone cypionate (DEPO-TESTOSTERONE) 200 MG/ML injection  >Inject one ML as directed every 2 weeks as directed.

## 2016-11-12 MED ORDER — TESTOSTERONE CYPIONATE 200 MG/ML IM SOLN: 200.0000 mg | INTRAMUSCULAR | 5 refills | Status: DC

## 2016-11-12 NOTE — Telephone Encounter (Signed)
rx called in-aa 

## 2016-11-12 NOTE — Telephone Encounter (Signed)
Please call in testosterone.

## 2016-11-15 ENCOUNTER — Other Ambulatory Visit: Payer: Self-pay | Admitting: Family Medicine

## 2016-11-22 ENCOUNTER — Ambulatory Visit: Payer: Self-pay | Admitting: Family Medicine

## 2016-12-03 ENCOUNTER — Other Ambulatory Visit: Payer: Self-pay | Admitting: Family Medicine

## 2016-12-03 DIAGNOSIS — R7989 Other specified abnormal findings of blood chemistry: Secondary | ICD-10-CM

## 2016-12-03 MED ORDER — TESTOSTERONE CYPIONATE 200 MG/ML IM SOLN: 200.0000 mg | INTRAMUSCULAR | 1 refills | Status: DC

## 2016-12-03 MED ORDER — TESTOSTERONE CYPIONATE 200 MG/ML IM SOLN: 200.0000 mg | INTRAMUSCULAR | 5 refills | Status: DC

## 2016-12-03 NOTE — Telephone Encounter (Signed)
[facility] faxed a 90-days supply for the following medication. Thanks CC  testosterone cypionate (DEPO-TESTOSTERONE) 200 MG/ML injection

## 2016-12-03 NOTE — Telephone Encounter (Signed)
Please call in depo-testosterone

## 2016-12-07 ENCOUNTER — Other Ambulatory Visit: Payer: Self-pay | Admitting: Family Medicine

## 2016-12-07 MED ORDER — METFORMIN HCL 1000 MG PO TABS: 1000.0000 mg | ORAL_TABLET | Freq: Two times a day (BID) | ORAL | 4 refills | Status: DC

## 2016-12-07 NOTE — Telephone Encounter (Signed)
[facility] faxed a request for a 90-days supply for the following medication. Thanks CC  metFORMIN (GLUCOPHAGE) 1000 MG tablet

## 2016-12-08 ENCOUNTER — Ambulatory Visit (INDEPENDENT_AMBULATORY_CARE_PROVIDER_SITE_OTHER): Payer: BLUE CROSS/BLUE SHIELD | Admitting: Family Medicine

## 2016-12-08 ENCOUNTER — Encounter: Payer: Self-pay | Admitting: Family Medicine

## 2016-12-08 VITALS — BP 124/78 | HR 60 | Temp 98.3°F | Resp 16 | Ht 71.0 in | Wt 291.0 lb

## 2016-12-08 DIAGNOSIS — E1140 Type 2 diabetes mellitus with diabetic neuropathy, unspecified: Secondary | ICD-10-CM

## 2016-12-08 DIAGNOSIS — Z0000 Encounter for general adult medical examination without abnormal findings: Secondary | ICD-10-CM | POA: Diagnosis not present

## 2016-12-08 DIAGNOSIS — K7400 Hepatic fibrosis, unspecified: Secondary | ICD-10-CM

## 2016-12-08 DIAGNOSIS — I10 Essential (primary) hypertension: Secondary | ICD-10-CM

## 2016-12-08 DIAGNOSIS — R195 Other fecal abnormalities: Secondary | ICD-10-CM

## 2016-12-08 DIAGNOSIS — R160 Hepatomegaly, not elsewhere classified: Secondary | ICD-10-CM

## 2016-12-08 DIAGNOSIS — J069 Acute upper respiratory infection, unspecified: Secondary | ICD-10-CM

## 2016-12-08 DIAGNOSIS — R7989 Other specified abnormal findings of blood chemistry: Secondary | ICD-10-CM | POA: Diagnosis not present

## 2016-12-08 DIAGNOSIS — E039 Hypothyroidism, unspecified: Secondary | ICD-10-CM | POA: Diagnosis not present

## 2016-12-08 DIAGNOSIS — Z794 Long term (current) use of insulin: Secondary | ICD-10-CM

## 2016-12-08 DIAGNOSIS — Z23 Encounter for immunization: Secondary | ICD-10-CM | POA: Diagnosis not present

## 2016-12-08 DIAGNOSIS — R05 Cough: Secondary | ICD-10-CM | POA: Diagnosis not present

## 2016-12-08 DIAGNOSIS — R059 Cough, unspecified: Secondary | ICD-10-CM

## 2016-12-08 DIAGNOSIS — R1031 Right lower quadrant pain: Secondary | ICD-10-CM | POA: Diagnosis not present

## 2016-12-08 DIAGNOSIS — E785 Hyperlipidemia, unspecified: Secondary | ICD-10-CM

## 2016-12-08 LAB — POCT UA - MICROALBUMIN: Microalbumin Ur, POC: 20 mg/L

## 2016-12-08 MED ORDER — AMOXICILLIN 500 MG PO CAPS: 1000.0000 mg | ORAL_CAPSULE | Freq: Two times a day (BID) | ORAL | 0 refills | Status: AC

## 2016-12-08 NOTE — Patient Instructions (Signed)
   Stop metformin for 1 week and let me know whether or not this resolves problems with bowel movements

## 2016-12-08 NOTE — Progress Notes (Signed)
Patient: Patrick Hurst, Male    DOB: Sep 13, 1955, 61 y.o.   MRN: [id number] Visit Date: 12/08/2016  Today's Provider: Lelon Huh, MD   Chief Complaint  Patient presents with  . Annual Exam  . Diabetes  . Hypertension  . Hyperlipidemia  . Hypothyroidism   Subjective:    Annual physical exam Patrick Hurst is a 61 y.o. male who presents today for health maintenance and complete physical. He feels well. He reports exercising some. He reports he is sleeping well.  ----------------------------------------------------------------   Diabetes Mellitus Type II, Follow-up:   Lab Results  Component Value Date   HGBA1C 5.9 07/19/2016   HGBA1C 4.8 02/17/2016   HGBA1C 5.6 07/14/2015   Last seen for diabetes 5 months ago.  Management since then includes; reduced humulin from 30 to 25 units BID before meals.  He reports good compliance with treatment. He is not having side effects. none Current symptoms include none and have been unchanged. Home blood sugar records: fasting range: 150-200  Episodes of hypoglycemia? no   Current Insulin Regimen: 25 units Humulin Most Recent Eye Exam:  Weight trend: stable Prior visit with dietician: no Current diet: well balanced Current exercise: walking  ----------------------------------------------------------------   Hypertension, follow-up:  BP Readings from Last 3 Encounters:  12/08/16 124/78  07/19/16 (!) 160/92  05/06/16 (!) 149/76    He was last seen for hypertension 5 months ago.  BP at that visit was 120/70. Management since that visit includes; no changes.He reports good compliance with treatment. He is not having side effects. none He is exercising. He is adherent to low salt diet.   Outside blood pressures are 140/80. He is experiencing none.  Patient denies none.   Cardiovascular risk factors include diabetes mellitus.  Use of agents associated with hypertension: none.    ----------------------------------------------------------------    Lipid/Cholesterol, Follow-up:   Last seen for this 8 months ago.  Management since that visit includes; labs checked, no changes.  Last Lipid Panel:    Component Value Date/Time   CHOL 112 04/19/2016 0850   CHOL 180 03/14/2012 0321   TRIG 331 (H) 04/19/2016 0850   TRIG 594 (H) 03/14/2012 0321   HDL 26 (L) 04/19/2016 0850   HDL 21 (L) 03/14/2012 0321   CHOLHDL 4.3 04/19/2016 0850   VLDL SEE COMMENT 03/14/2012 0321   LDLCALC 20 04/19/2016 0850   LDLCALC SEE COMMENT 03/14/2012 0321    He reports good compliance with treatment. He is not having side effects. none  Wt Readings from Last 3 Encounters:  12/08/16 291 lb (132 kg)  07/19/16 (!) 307 lb (139.3 kg)  05/06/16 289 lb (131.1 kg)    ----------------------------------------------------------------  Low testosterone States he is doing testosterone injections consistently every two weeks. Has noticed a little improvement in his energy level, but states hs still feel tired most of the time.   Hypothyroidism, unspecified type From 07/19/2016-labs checked, no changes. Taking levothyroxine consistently, still feels fatigued.   Also complains of persistent cough producing green mucous for a couple of weeks. No dyspnea of fever, no other cold or sinus symptoms. He quit smoking over 25 years ago  He also complains that he has started having urge to have BM after every meal, usually producing loose or watery diarrhea. He has had a little trouble with this every since he had his gallbladder out, but has been worse lately.    Review of Systems  Constitutional: Positive for fatigue.  HENT: Positive  for congestion and postnasal drip.   Eyes: Negative.   Respiratory: Positive for apnea, cough and shortness of breath.   Cardiovascular: Positive for leg swelling.  Gastrointestinal: Positive for diarrhea.  Endocrine: Positive for polydipsia.  Genitourinary:  Negative.   Musculoskeletal: Positive for arthralgias and back pain.  Skin: Positive for color change.  Allergic/Immunologic: Negative.   Neurological: Negative.   Hematological: Negative.   Psychiatric/Behavioral: Negative.     Social History      He  reports that he quit smoking about 38 years ago. His smoking use included Cigarettes. He has a 15.00 pack-year smoking history. He has quit using smokeless tobacco. He reports that he does not drink alcohol or use drugs.       Social History   Social History  . Marital status: Married    Spouse name: N/A  . Number of children: N/A  . Years of education: N/A   Occupational History  . Works at [employer]     Full time   Social History Main Topics  . Smoking status: Former Smoker    Packs/day: 1.00    Years: 15.00    Types: Cigarettes    Quit date: 05/03/1978  . Smokeless tobacco: Former Systems developer     Comment: started smoking at age 40  . Alcohol use No  . Drug use: No  . Sexual activity: Not Asked   Other Topics Concern  . None   Social History Narrative  . None    Past Medical History:  Diagnosis Date  . Allergy   . Anemia   . Arthritis   . Bell's palsy   . Depression   . Diabetes ([place])   . Gastric ulcer   . GERD (gastroesophageal reflux disease)   . Gout   . History of chicken pox   . Hyperlipidemia   . Hypertension   . Hypothyroidism   . OSA (obstructive sleep apnea)   . Thyroid disease   . Vitamin D deficiency      Patient Active Problem List   Diagnosis Date Noted  . Bilateral anterior knee pain 04/06/2016  . Primary osteoarthritis of both knees 10/01/2015  . Morbid (severe) obesity due to excess calories ([place]) 10/01/2015  . Hepatomegaly 09/19/2015  . Splenomegaly 09/19/2015  . ANA positive 07/21/2015  . Hepatic fibrosis 04/16/2015  . Charcot ankle 04/02/2015  . Thrombocytopenia ([place]) 02/14/2015  . Allergic rhinitis 02/12/2015  . Bell palsy 02/12/2015  . Carpal tunnel syndrome 02/12/2015  .  Clinical depression 02/12/2015  . Diabetes mellitus with neuropathy ([place]) 02/12/2015  . Edema 02/12/2015  . Erectile dysfunction 02/12/2015  . HLD (hyperlipidemia) 02/12/2015  . Hypertension 02/12/2015  . Hypothyroidism 02/12/2015  . Anemia, iron deficiency 02/12/2015  . Low testosterone 02/12/2015  . Neuropathy 02/12/2015  . Obesity 02/12/2015  . Peripheral neuropathy 02/12/2015  . Obstructive sleep apnea 02/12/2015  . Snores 02/12/2015  . Gout 05/05/2012  . Gastric ulcer 11/21/2009    Past Surgical History:  Procedure Laterality Date  . CATARACT EXTRACTION Bilateral 2014 and 2015  . CHOLECYSTECTOMY  04/28/2011   Laproscopic; Dr. Pat Patrick  . GALLBLADDER SURGERY    . SHOULDER SURGERY Right 2012   Dr. Leanor Kail    Family History        Family Status  Relation Status  . Mother Deceased  . Father Alive        His family history includes COPD in his mother; Heart disease in his father.     Allergies  Allergen Reactions  . Mucinex  [Guaifenesin Er]     Throat swelling, increases heart rate  . Levaquin [Levofloxacin] Palpitations     Current Outpatient Prescriptions:  .  albuterol (PROVENTIL HFA;VENTOLIN HFA) 108 (90 Base) MCG/ACT inhaler, Inhale 2 puffs into the lungs every 4 (four) hours as needed for wheezing or shortness of breath., Disp: 1 Inhaler, Rfl: 2 .  amLODipine-benazepril (LOTREL) 5-40 MG capsule, TAKE 1 CAPSULE DAILY (CHANGED DOSE), Disp: 90 capsule, Rfl: 4 .  Ascorbic Acid (VITAMIN C) 1000 MG tablet, Take 1,000 mg by mouth daily., Disp: , Rfl:  .  aspirin 81 MG tablet, Take 81 mg by mouth daily., Disp: , Rfl:  .  atorvastatin (LIPITOR) 40 MG tablet, Take 1 tablet (40 mg total) by mouth daily., Disp: 90 tablet, Rfl: 4 .  B-D INS SYR ULTRAFINE 1CC/30G 30G X 1/2" 1 ML MISC, , Disp: , Rfl:  .  Calcium Carbonate (CALCIUM 600 PO), Take 1 tablet by mouth daily., Disp: , Rfl:  .  CETIRIZINE HCL PO, Take by mouth., Disp: , Rfl:  .  Chlorpheniramine Maleate  (ALLERGY RELIEF PO), Take 1 tablet by mouth daily., Disp: , Rfl:  .  Cholecalciferol (VITAMIN D3 PO), Take by mouth 2 (two) times daily. , Disp: , Rfl:  .  Cyanocobalamin (B-12 PO), Take by mouth daily., Disp: , Rfl:  .  ferrous sulfate 325 (65 FE) MG tablet, Take 325 mg by mouth daily with breakfast., Disp: , Rfl:  .  insulin regular human CONCENTRATED (HUMULIN R) 500 UNIT/ML injection, Takes 25 units in the am and 25 units in the pm, Disp: 60 mL, Rfl: 3 .  levothyroxine (SYNTHROID, LEVOTHROID) 100 MCG tablet, TAKE ONE TABLET BY MOUTH ONCE DAILY, Disp: 90 tablet, Rfl: 4 .  meloxicam (MOBIC) 7.5 MG tablet, 1 tab once a day with food as needed only for severe knee pain, Disp: , Rfl:  .  metFORMIN (GLUCOPHAGE) 1000 MG tablet, Take 1 tablet (1,000 mg total) by mouth 2 (two) times daily., Disp: 180 tablet, Rfl: 4 .  metoprolol tartrate (LOPRESSOR) 50 MG tablet, TAKE 1 TABLET TWICE A DAY, Disp: 180 tablet, Rfl: 4 .  ONE TOUCH ULTRA TEST test strip, USE 1 STRIP TWICE A DAY, Disp: 200 each, Rfl: 4 .  pantoprazole (PROTONIX) 40 MG tablet, TAKE 1 TABLET TWICE A DAY, Disp: 180 tablet, Rfl: 4 .  POTASSIUM PO, Take by mouth daily., Disp: , Rfl:  .  testosterone cypionate (DEPO-TESTOSTERONE) 200 MG/ML injection, Inject 1 mL (200 mg total) into the muscle every 14 (fourteen) days., Disp: 10 mL, Rfl: 1   Patient Care Team: [name], MD as PCP - General (Family Medicine) Lucilla Lame, MD as Consulting Physician (Gastroenterology)      Objective:   Vitals: BP 124/78 (BP Location: Right Arm, Patient Position: Sitting, Cuff Size: Large)   Pulse 60   Temp 98.3 F (36.8 C) (Oral)   Resp 16   Ht 5\' 11"  (1.803 m)   Wt 291 lb (132 kg)   SpO2 94%   BMI 40.59 kg/m    Vitals:   12/08/16 0907  BP: 124/78  Pulse: 60  Resp: 16  Temp: 98.3 F (36.8 C)  TempSrc: Oral  SpO2: 94%  Weight: 291 lb (132 kg)  Height: 5\' 11"  (1.803 m)     Physical Exam   General Appearance:    Alert, cooperative, no  distress, appears stated age  Head:    Normocephalic, without obvious abnormality, atraumatic  Eyes:    PERRL, conjunctiva/corneas clear, EOM's intact, fundi    benign, both eyes       Ears:    Normal TM's and external ear canals, both ears  Nose:   Nares normal, septum midline, mucosa normal, no drainage   or sinus tenderness  Throat:   Lips, mucosa, and tongue normal; teeth and gums normal  Neck:   Supple, symmetrical, trachea midline, no adenopathy;       thyroid:  No enlargement/tenderness/nodules; no carotid   bruit or JVD  Back:     Symmetric, no curvature, ROM normal, no CVA tenderness  Lungs:    Mild scattered expiratory wheezes, no rales. No rhonchi respirations unlabored  Chest wall:    No tenderness or deformity  Heart:    Regular rate and rhythm, S1 and S2 normal, no murmur, rub   or gallop  Abdomen:     Soft, non-tender, bowel sounds active all four quadrants,    no masses, no organomegaly  Genitalia:    small right inguinal hernia palpated. no tenderness.   Rectal:    deferred  Extremities:   Extremities normal, atraumatic, no cyanosis or edema  Pulses:   2+ and symmetric all extremities  Skin:   Skin color, texture, turgor normal, no rashes or lesions  Lymph nodes:   Cervical, supraclavicular, and axillary nodes normal  Neurologic:   CNII-XII intact. Normal strength, sensation and reflexes      throughout     Depression Screen PHQ 2/9 Scores 12/08/2016 02/16/2016 02/12/2015  PHQ - 2 Score 0 2 4  PHQ- 9 Score 2 12 11       Assessment & Plan:     Routine Health Maintenance and Physical Exam  Exercise Activities and Dietary recommendations Goals    None      Immunization History  Administered Date(s) Administered  . Influenza, Seasonal, Injecte, Preservative Fre 03/13/2007, 01/22/2008, 03/17/2009, 03/18/2010, 02/01/2011  . Influenza,inj,Quad PF,36+ Mos 02/08/2013, 01/18/2014, 02/12/2015, 02/16/2016  . Pneumococcal Polysaccharide-23 05/03/2000, 06/23/2009    . Tdap 12/30/2006    Health Maintenance  Topic Date Due  . FOOT EXAM  04/06/1967  . HIV Screening  04/05/1972  . INFLUENZA VACCINE  12/01/2016  . TETANUS/TDAP  12/29/2016  . HEMOGLOBIN A1C  01/19/2017  . OPHTHALMOLOGY EXAM  11/01/2017  . COLONOSCOPY  11/25/2019  . PNEUMOCOCCAL POLYSACCHARIDE VACCINE  Completed  . Hepatitis C Screening  Completed     Discussed health benefits of physical activity, and encouraged him to engage in regular exercise appropriate for his age and condition.    -------------------------------------------------------------------- 1. Annual physical exam  - Comprehensive metabolic panel - CBC  2. Type 2 diabetes mellitus with diabetic neuropathy, with long-term current use of insulin (HCC) Doing well on current dose of insulin.  POCT UA - Microalbumin - Comprehensive metabolic panel - Hemoglobin A1c  3. Essential hypertension Well controlled.  Continue current medications.    4. Hypothyroidism, unspecified type  - TSH - EKG 12-Lead  6. Hyperlipidemia, unspecified hyperlipidemia type He is tolerating atorvastatin well with no adverse effects.   - Comprehensive metabolic panel - Lipid panel  7. Low testosterone Stable on testosterone injections.  - Testosterone,Free and Total  8. Morbid (severe) obesity due to excess calories (HCC) Diet and exercise.   9. Need for prophylactic vaccination using tetanus and diphtheria toxoids adsorbed (Td) vaccine  - Td vaccine greater than or equal to 7yo preservative free IM  10. Cough   11. Right inguinal pain Very mild  and intermittent. Likely related to small inguinal hernia. He is to call if it becomes more persistent.   12. Loose stools May be related to metformin, which he is to stop for 1 week, if no improvement will try cholestyramine.   13. Hepatic fibrosis  - Hepatitis A vaccine adult IM - Hepatitis B vaccine adult IM  14. Upper respiratory tract infection, unspecified type  -  amoxicillin (AMOXIL) 500 MG capsule; Take 2 capsules (1,000 mg total) by mouth 2 (two) times daily.  Dispense: 40 capsule; Refill: 0  Call if symptoms change or if not rapidly improving.        Lelon Huh, MD  [facility]

## 2016-12-09 ENCOUNTER — Other Ambulatory Visit: Payer: Self-pay | Admitting: *Deleted

## 2016-12-09 DIAGNOSIS — R944 Abnormal results of kidney function studies: Secondary | ICD-10-CM

## 2016-12-09 LAB — COMPREHENSIVE METABOLIC PANEL
A/G RATIO: 1.6 (ref 1.2–2.2)
ALBUMIN: 4.2 g/dL (ref 3.5–5.5)
ALK PHOS: 84 IU/L (ref 39–117)
ALT: 21 IU/L (ref 0–44)
AST: 22 IU/L (ref 0–40)
BILIRUBIN TOTAL: 0.4 mg/dL (ref 0.0–1.2)
BUN / CREAT RATIO: 18 (ref 9–20)
BUN: 55 mg/dL — AB (ref 6–24)
CHLORIDE: 108 mmol/L — AB (ref 96–106)
CO2: 18 mmol/L — ABNORMAL LOW (ref 20–29)
Calcium: 8.2 mg/dL — ABNORMAL LOW (ref 8.7–10.2)
Creatinine, Ser: 3.06 mg/dL — ABNORMAL HIGH (ref 0.76–1.27)
GFR calc Af Amer: 25 mL/min/{1.73_m2} — ABNORMAL LOW (ref >59)
GFR calc non Af Amer: 21 mL/min/{1.73_m2} — ABNORMAL LOW (ref >59)
GLOBULIN, TOTAL: 2.6 g/dL (ref 1.5–4.5)
GLUCOSE: 160 mg/dL — AB (ref 65–99)
POTASSIUM: 5.3 mmol/L — AB (ref 3.5–5.2)
SODIUM: 145 mmol/L — AB (ref 134–144)
Total Protein: 6.8 g/dL (ref 6.0–8.5)

## 2016-12-09 LAB — HEMOGLOBIN A1C
ESTIMATED AVERAGE GLUCOSE: 111 mg/dL
HEMOGLOBIN A1C: 5.5 % (ref 4.8–5.6)

## 2016-12-09 LAB — CBC
Hematocrit: 33.5 % — ABNORMAL LOW (ref 37.5–51.0)
Hemoglobin: 11.4 g/dL — ABNORMAL LOW (ref 13.0–17.7)
MCH: 30.4 pg (ref 26.6–33.0)
MCHC: 34 g/dL (ref 31.5–35.7)
MCV: 89 fL (ref 79–97)
PLATELETS: 105 10*3/uL — AB (ref 150–379)
RBC: 3.75 x10E6/uL — ABNORMAL LOW (ref 4.14–5.80)
RDW: 15.2 % (ref 12.3–15.4)
WBC: 7.2 10*3/uL (ref 3.4–10.8)

## 2016-12-09 LAB — LIPID PANEL
CHOLESTEROL TOTAL: 124 mg/dL (ref 100–199)
Chol/HDL Ratio: 6.5 ratio — ABNORMAL HIGH (ref 0.0–5.0)
HDL: 19 mg/dL — ABNORMAL LOW (ref >39)
Triglycerides: 681 mg/dL (ref 0–149)

## 2016-12-09 LAB — TESTOSTERONE,FREE AND TOTAL
TESTOSTERONE: 268 ng/dL (ref 264–916)
Testosterone, Free: 11.7 pg/mL (ref 7.2–24.0)

## 2016-12-09 LAB — TSH: TSH: 3.15 u[IU]/mL (ref 0.450–4.500)

## 2016-12-14 ENCOUNTER — Telehealth: Payer: Self-pay | Admitting: *Deleted

## 2016-12-14 DIAGNOSIS — R944 Abnormal results of kidney function studies: Secondary | ICD-10-CM | POA: Diagnosis not present

## 2016-12-14 MED ORDER — AMLODIPINE BESYLATE 5 MG PO TABS: 5.0000 mg | ORAL_TABLET | Freq: Every day | ORAL | 1 refills | Status: DC

## 2016-12-14 NOTE — Telephone Encounter (Signed)
Patient came to office to pick-up lab slip and have his bp checked. Patient's blood pressure was 160/80.

## 2016-12-15 LAB — RENAL FUNCTION PANEL
Albumin: 3.9 g/dL (ref 3.5–5.5)
BUN / CREAT RATIO: 18 (ref 9–20)
BUN: 48 mg/dL — ABNORMAL HIGH (ref 6–24)
CALCIUM: 8.8 mg/dL (ref 8.7–10.2)
CO2: 21 mmol/L (ref 20–29)
CREATININE: 2.67 mg/dL — AB (ref 0.76–1.27)
Chloride: 108 mmol/L — ABNORMAL HIGH (ref 96–106)
GFR calc Af Amer: 29 mL/min/{1.73_m2} — ABNORMAL LOW (ref >59)
GFR, EST NON AFRICAN AMERICAN: 25 mL/min/{1.73_m2} — AB (ref >59)
GLUCOSE: 197 mg/dL — AB (ref 65–99)
PHOSPHORUS: 4.1 mg/dL (ref 2.5–4.5)
POTASSIUM: 5.3 mmol/L — AB (ref 3.5–5.2)
SODIUM: 140 mmol/L (ref 134–144)

## 2016-12-21 ENCOUNTER — Other Ambulatory Visit: Payer: Self-pay

## 2016-12-21 ENCOUNTER — Telehealth: Payer: Self-pay

## 2016-12-21 DIAGNOSIS — I10 Essential (primary) hypertension: Secondary | ICD-10-CM

## 2016-12-21 DIAGNOSIS — R944 Abnormal results of kidney function studies: Secondary | ICD-10-CM

## 2016-12-21 NOTE — Telephone Encounter (Signed)
Patient came by the office to pick up a lab slip to have his renal panel rechecked. He also requested a blood pressure check.   Today's blood pressure reading was 178/80 (left arm) and 162/72 (right arm). Patient reports that his home blood pressure readings have been running between 177-116 (systolic) over 57-903 (diastolic). Please advise.

## 2016-12-21 NOTE — Telephone Encounter (Signed)
Per Dr. Caryn Section, patient needs to increase Amlodipine from 5mg  daily to 10mg  daily. Patient was advised and agrees to treatment plan. I also advised patient to continue monitoring blood pressure at home.

## 2016-12-22 ENCOUNTER — Other Ambulatory Visit: Payer: Self-pay | Admitting: Family Medicine

## 2016-12-22 DIAGNOSIS — I10 Essential (primary) hypertension: Secondary | ICD-10-CM

## 2016-12-22 DIAGNOSIS — N179 Acute kidney failure, unspecified: Secondary | ICD-10-CM

## 2016-12-22 LAB — RENAL FUNCTION PANEL
ALBUMIN: 4 g/dL (ref 3.5–5.5)
BUN / CREAT RATIO: 16 (ref 9–20)
BUN: 46 mg/dL — AB (ref 6–24)
CALCIUM: 8.7 mg/dL (ref 8.7–10.2)
CHLORIDE: 107 mmol/L — AB (ref 96–106)
CO2: 22 mmol/L (ref 20–29)
Creatinine, Ser: 2.86 mg/dL — ABNORMAL HIGH (ref 0.76–1.27)
GFR calc Af Amer: 27 mL/min/{1.73_m2} — ABNORMAL LOW (ref >59)
GFR calc non Af Amer: 23 mL/min/{1.73_m2} — ABNORMAL LOW (ref >59)
Glucose: 179 mg/dL — ABNORMAL HIGH (ref 65–99)
PHOSPHORUS: 4.6 mg/dL — AB (ref 2.5–4.5)
Potassium: 5 mmol/L (ref 3.5–5.2)
Sodium: 143 mmol/L (ref 134–144)

## 2016-12-29 ENCOUNTER — Other Ambulatory Visit: Payer: Self-pay | Admitting: Family Medicine

## 2016-12-29 MED ORDER — AMLODIPINE BESYLATE 10 MG PO TABS: 10.0000 mg | ORAL_TABLET | Freq: Every day | ORAL | 3 refills | Status: DC

## 2016-12-29 NOTE — Telephone Encounter (Signed)
Please advise changed to 10mg  tablets so he only needs to take ONE a day instead of two

## 2016-12-29 NOTE — Telephone Encounter (Signed)
Please advise 

## 2016-12-29 NOTE — Telephone Encounter (Signed)
Pt contacted office for refill request on the following medications:  amLODipine (NORVASC) 5 MG tablet   Pt states he is now taking 2 pills a day and is requesting a new Rx sent in to give him enough medication got 30 days.  [place]  QS#[phone]

## 2016-12-30 ENCOUNTER — Ambulatory Visit: Payer: BLUE CROSS/BLUE SHIELD

## 2016-12-30 DIAGNOSIS — I10 Essential (primary) hypertension: Secondary | ICD-10-CM | POA: Diagnosis not present

## 2016-12-30 DIAGNOSIS — R809 Proteinuria, unspecified: Secondary | ICD-10-CM | POA: Diagnosis not present

## 2016-12-30 DIAGNOSIS — N184 Chronic kidney disease, stage 4 (severe): Secondary | ICD-10-CM | POA: Diagnosis not present

## 2016-12-30 DIAGNOSIS — E1129 Type 2 diabetes mellitus with other diabetic kidney complication: Secondary | ICD-10-CM | POA: Diagnosis not present

## 2016-12-30 DIAGNOSIS — N179 Acute kidney failure, unspecified: Secondary | ICD-10-CM

## 2016-12-30 NOTE — Telephone Encounter (Signed)
Advised patient as below.  

## 2017-01-10 ENCOUNTER — Ambulatory Visit (INDEPENDENT_AMBULATORY_CARE_PROVIDER_SITE_OTHER): Payer: BLUE CROSS/BLUE SHIELD | Admitting: Family Medicine

## 2017-01-10 ENCOUNTER — Telehealth: Payer: Self-pay | Admitting: *Deleted

## 2017-01-10 DIAGNOSIS — Z23 Encounter for immunization: Secondary | ICD-10-CM | POA: Diagnosis not present

## 2017-01-10 NOTE — Telephone Encounter (Signed)
Can start glipizide XL 2.5, once a day for blood sugar #30, rf x 2. Hold off on any new BP medications until kidney functions stabilize.

## 2017-01-10 NOTE — Telephone Encounter (Signed)
Patient was in office today and has concerns about his blood sugar readings. Patient stated that blood sugars are never below 200. Patient stated that he is seeing Dr. [name] 01/20/17 and is suppose to discuss other medications that pt can try in place of metformin. Dr. [name] is suppose to send information to Dr. Caryn Section concerning this. Also pt states his bp is always averaging around 163/80. Patient is wanting advise on what else he can do for blood sugar and blood pressure. Please advise?

## 2017-01-11 MED ORDER — GLIPIZIDE ER 2.5 MG PO TB24: 2.5000 mg | ORAL_TABLET | Freq: Every day | ORAL | 2 refills | Status: DC

## 2017-01-11 NOTE — Telephone Encounter (Signed)
Patient was notified. Rx sent to pharmacy.  

## 2017-01-11 NOTE — Progress Notes (Signed)
No MD visit, immunization only

## 2017-01-20 DIAGNOSIS — N184 Chronic kidney disease, stage 4 (severe): Secondary | ICD-10-CM | POA: Diagnosis not present

## 2017-01-20 DIAGNOSIS — E1129 Type 2 diabetes mellitus with other diabetic kidney complication: Secondary | ICD-10-CM | POA: Diagnosis not present

## 2017-01-20 DIAGNOSIS — K7460 Unspecified cirrhosis of liver: Secondary | ICD-10-CM | POA: Diagnosis not present

## 2017-01-20 DIAGNOSIS — I10 Essential (primary) hypertension: Secondary | ICD-10-CM | POA: Diagnosis not present

## 2017-02-14 ENCOUNTER — Other Ambulatory Visit: Payer: Self-pay | Admitting: Podiatry

## 2017-02-14 ENCOUNTER — Ambulatory Visit (INDEPENDENT_AMBULATORY_CARE_PROVIDER_SITE_OTHER): Payer: BLUE CROSS/BLUE SHIELD

## 2017-02-14 ENCOUNTER — Ambulatory Visit (INDEPENDENT_AMBULATORY_CARE_PROVIDER_SITE_OTHER): Payer: BLUE CROSS/BLUE SHIELD | Admitting: Podiatry

## 2017-02-14 ENCOUNTER — Encounter: Payer: Self-pay | Admitting: Podiatry

## 2017-02-14 ENCOUNTER — Ambulatory Visit: Payer: BLUE CROSS/BLUE SHIELD | Admitting: Podiatry

## 2017-02-14 DIAGNOSIS — M722 Plantar fascial fibromatosis: Secondary | ICD-10-CM

## 2017-02-14 DIAGNOSIS — M79672 Pain in left foot: Secondary | ICD-10-CM

## 2017-02-14 DIAGNOSIS — M109 Gout, unspecified: Secondary | ICD-10-CM

## 2017-02-14 DIAGNOSIS — E1142 Type 2 diabetes mellitus with diabetic polyneuropathy: Secondary | ICD-10-CM | POA: Diagnosis not present

## 2017-02-14 MED ORDER — METHYLPREDNISOLONE 4 MG PO TABS: ORAL_TABLET | ORAL | 0 refills | Status: DC

## 2017-02-14 NOTE — Progress Notes (Signed)
This patient presents the office with chief complaint of severe foot pain and swelling in his left foot.  He states that the pain in the swelling began approximately one week ago and he has attempted to remain nonweightbearing and to ambulate minimally.  He says that his foot continues to throb and sharp shooting pains shoot through his foot as he walks.  He previously was seen by Dr. Jacqualyn Posey for a diagnosed Charcot foot, right foot.  Patient denies any history of trauma or injury to this foot.  This patient is diabetic and has both kidney and liver disease.  Patient states this limits. The medicine that he can take in the treatment of pathology.  He presents the office today for an evaluation and treatment of his painful left foot.  Patient is brought through the office on a wheelchair to be examined.  He is accompanied by his wife.   General Appearance  Alert, conversant and in no acute stress.  Vascular  Dorsalis pedis and posterior pulses are palpable  bilaterally.  Capillary return is within normal limits  Bilaterally. Increased temperature is noted in his left foot  Neurologic  Senn-Weinstein monofilament wire test is diminished  bilaterally. Muscle power  Within normal limits right with severe pain noted around left ankle and over the top of left foot..  Nails Thick disfigured discolored nails with subungual debride bilaterally from hallux to fifth toes bilaterally. No evidence of bacterial infection or drainage bilaterally.  Orthopedic  No limitations of motion of motion feet bilaterally.  No crepitus or effusions noted.  No bony pathology or digital deformities noted. Examination of his left foot reveals severe pain upon palpation of the plantar fascia.  Localized swelling is noted through the plantar fascia left foot.  Severe pain is elicited upon palpation of the anterior aspect of the left ankle.  Severe pain is noted upon dorsiflexion of the foot on the ankle.  There is increased temperature  noted through the foot.  Skin  normotropic skin with no porokeratosis noted bilaterally.  No signs of infections or ulcers noted.  No evidence of any redness noted or streaking or skin trauma.  Acute gout left ankle.   Initial examination.  Examination of the x-rays reveal calcification at the insertion of the Achilles tendon.  Possible fracture at the base of the fourth metatarsal.  No evidence of any bone/joint breakdown indicating a Charcot foot.  My examination revealed significant pain upon motion and palpation of the left ankle.  This patient has the inability to weight-bear due to the level of pain in his left foot and ankle.  Patient does have a past history of gout, which is consistent with his kidney disease.  I proceeded to order a Medrol Dosepak for this patient as well as ordered an arthritis profile to evaluate for possible gout.  He is to remain nonweightbearing and to return to the office in 3 days at which time we can reevaluate and then treat.   Gardiner Barefoot DPM

## 2017-02-15 ENCOUNTER — Telehealth: Payer: Self-pay | Admitting: *Deleted

## 2017-02-15 DIAGNOSIS — M109 Gout, unspecified: Secondary | ICD-10-CM

## 2017-02-15 LAB — ARTHRITIS PANEL
ANA: POSITIVE — AB
RHEUMATOID FACTOR: 16 [IU]/mL — AB (ref 0.0–13.9)
Sed Rate: 62 mm/hr — ABNORMAL HIGH (ref 0–30)
URIC ACID: 10.5 mg/dL — AB (ref 3.7–8.6)

## 2017-02-15 NOTE — Telephone Encounter (Signed)
Dr. Prudence Davidson states pt should be referred to Rheumatologist due to abnormal arthritic panel. I informed pt of Dr. Burnell Blanks review of results and he states he sees a lady rheumatologist at [facility]. I told pt I would inform Dr. Prudence Davidson and send latest clinical note and blood work. Faxed clinicals and Arthritic panel to [facility].

## 2017-02-17 ENCOUNTER — Encounter: Payer: Self-pay | Admitting: Podiatry

## 2017-02-17 ENCOUNTER — Ambulatory Visit (INDEPENDENT_AMBULATORY_CARE_PROVIDER_SITE_OTHER): Payer: BLUE CROSS/BLUE SHIELD | Admitting: Podiatry

## 2017-02-17 DIAGNOSIS — M722 Plantar fascial fibromatosis: Secondary | ICD-10-CM | POA: Diagnosis not present

## 2017-02-17 DIAGNOSIS — M109 Gout, unspecified: Secondary | ICD-10-CM

## 2017-02-17 NOTE — Progress Notes (Signed)
This patient presents the office stating his left foot is markedly improved and I am now able to touch his left foot and ankle.  He says that his left foot is 70% improved by taking the medication and also drinking black cherry  juice. He says he has noted that his blood sugar has increased taking the Medrol dosepak.  He has been pleased with his improvement since he is now able to walk and bear weight on his foot, which 3 days ago. He was unable to weight-bear.  He presents the office today for follow-up evaluation.   General Appearance  Alert, conversant and in no acute stress.  Vascular  Dorsalis pedis and posterior pulses are palpable  bilaterally.  Capillary return is within normal limits  Bilaterally. Temperature is noted to be normal with no evidence of increased temperature noted    Neurologic  Senn-Weinstein monofilament wire test is diminished  bilaterally. Muscle power  Within normal limits right with severe pain noted around left ankle and over the top of left foot..  Nails Thick disfigured discolored nails with subungual debride bilaterally from hallux to fifth toes bilaterally. No evidence of bacterial infection or drainage bilaterally.  Orthopedic  No limitations of motion of motion feet bilaterally.  No crepitus or effusions noted.  No bony pathology or digital deformities noted. Examination of his left foot reveals resolution of his swelling and increased temperature.  He does have continued pain noted on the plantar aspect of the left foot. No palpable pain or swelling noted at the anterior aspect of the left ankle. No pain on range of motion of the left ankle  Skin  normotropic skin with no porokeratosis noted bilaterally.  No signs of infections or ulcers noted.  No evidence of any redness noted or streaking or skin trauma.  Healing arthritic flare-up    ROV.  Examination of his left foot reveals healing noted at the left ankle due to his arthritic flareup.  The blood workup was  returned and there was increased uric acid levels positive ANA  increased sedimentation rate and a positive RA factor.  With these findings, I recommended he be evaluated and treated by his rheumatologist.  He says he has difficulty understanding his foreign rheumatologist.  I discussed this condition with this patient and had planned to treat him with colchicine except he says he has 27% kidney function and I thought the better part of valor was  to let him finish the Medrol Dosepak and call the office if the problem persists. No additional medication was prescribed.  RTC prn.  Gardiner Barefoot DPM

## 2017-02-23 ENCOUNTER — Encounter: Payer: Self-pay | Admitting: Podiatry

## 2017-02-23 ENCOUNTER — Ambulatory Visit (INDEPENDENT_AMBULATORY_CARE_PROVIDER_SITE_OTHER): Payer: BLUE CROSS/BLUE SHIELD | Admitting: Podiatry

## 2017-02-23 DIAGNOSIS — M722 Plantar fascial fibromatosis: Secondary | ICD-10-CM

## 2017-02-23 DIAGNOSIS — E1142 Type 2 diabetes mellitus with diabetic polyneuropathy: Secondary | ICD-10-CM | POA: Diagnosis not present

## 2017-02-23 DIAGNOSIS — M109 Gout, unspecified: Secondary | ICD-10-CM

## 2017-02-23 NOTE — Progress Notes (Addendum)
This patient returns to the office stating that he has continued pain and discomfort in his left foot.  He says the pain is most severe as he walks during the course of the day  . He says he went to work yesterday and had difficulty working. He presents the office today using a cane to walk into the treatment room.  He says he tried to make an appointment in [place], but was unable to see the doctor working in [place] today.  Therefore, he needed to make an appointment and presents the office today in [facility] for an evaluation of his left foot   General Appearance  Alert, conversant and in no acute stress.  Vascular  Dorsalis pedis and posterior pulses are palpable  bilaterally.  Capillary return is within normal limits  Bilaterally. Temperature is within normal limits  Bilaterally  Neurologic  Senn-Weinstein monofilament wire test diminished. limits  bilaterally. Muscle power  Within normal limits bilaterally.  Nails Normotropic nails with no evidence fungal or nail pathology.    Orthopedic  No limitations of motion of motion feet bilaterally.  No crepitus or effusions noted.  No bony pathology or digital deformities noted. There is continued swelling noted on the dorsum of the left ankle.  Swelling and pain persisting along the course of the plantar fascia of the left foot.  There is normal temperature bilaterally with no evidence of acute inflammation.  Palpation at the anterior ankle and the insertion of the Achilles tendon reveals no palpable pain.    Skin  normotropic skin with no porokeratosis noted bilaterally.  No signs of infections or ulcers noted.   Arthritic flare-up.  Gout.     Rov.  Examination of the left foot does reveal continued pain as result of the previous arthritic flareup to the left ankle  . Discussed this condition with this patient and we recommended he wear a Cam Walker on his left foot.  As he was leaving. he said that his foot is much better and he is able to  walk without the use of the cane. Previously discussed further treatment with Mateo Flow. Prior to his arrival at the office. We decided to send him for an evaluation by a rheumatologist.  He says that the doctor in [place] was difficult to communicate with.  Therefore, he would like to see another doctor in [place].   He does say that he will see a rheumatologist in [place], but only if there is no rheumatologist to be seen in [place].   He will be scheduled for follow-up exam. Once we determine which rheumatologist he needs to see.   Gardiner Barefoot DPM    Patient was to receive a letter allowing him to return to work on light duty.  Gardiner Barefoot DPM

## 2017-02-25 NOTE — Telephone Encounter (Signed)
I informed pt of Dr. Prudence Davidson orders for referral to [facility], due to no other rheumatology groups in [facility] than [facility]. Pt states he has the paper with [facility] information. Referral form, clinical and demographics faxed to [facility].

## 2017-02-25 NOTE — Addendum Note (Signed)
Addended by: Harriett Sine D on: 02/25/2017 04:26 PM   Modules accepted: Orders

## 2017-03-01 ENCOUNTER — Other Ambulatory Visit: Payer: Self-pay | Admitting: Family Medicine

## 2017-03-01 MED ORDER — GLUCOSE BLOOD VI STRP: ORAL_STRIP | 4 refills | Status: DC

## 2017-03-01 MED ORDER — GLIPIZIDE ER 2.5 MG PO TB24: 2.5000 mg | ORAL_TABLET | Freq: Every day | ORAL | 3 refills | Status: DC

## 2017-03-01 MED ORDER — AMLODIPINE BESYLATE 10 MG PO TABS: 10.0000 mg | ORAL_TABLET | Freq: Every day | ORAL | 3 refills | Status: DC

## 2017-03-01 NOTE — Telephone Encounter (Signed)
[facility] faxed a request for a 90-days supply for the following medications. Thanks CC  glipiZIDE (GLIPIZIDE XL) 2.5 MG 24 hr tablet   amLODipine (NORVASC) 10 MG tablet   ONE TOUCH ULTRA TEST test strip

## 2017-03-07 ENCOUNTER — Other Ambulatory Visit: Payer: Self-pay | Admitting: Family Medicine

## 2017-03-08 ENCOUNTER — Telehealth: Payer: Self-pay | Admitting: Family Medicine

## 2017-03-08 DIAGNOSIS — E119 Type 2 diabetes mellitus without complications: Secondary | ICD-10-CM

## 2017-03-08 MED ORDER — GLUCOSE BLOOD VI STRP: ORAL_STRIP | 12 refills | Status: DC

## 2017-03-08 NOTE — Telephone Encounter (Signed)
OK to change to Verio test strips

## 2017-03-08 NOTE — Telephone Encounter (Signed)
Lanelle Bal with [facility] stated they received Rx for One Touch Ultra test strips and pt is doing a program with [facility]. They sent pt a new meter as part of the program and that meter requires One Touch Verio test strips. Lanelle Bal is requesting verbal orders to change the Rx to Verio. CB#[phone] UQX#[id number]. Please advise. Thanks TNP

## 2017-03-08 NOTE — Telephone Encounter (Signed)
Please advise 

## 2017-03-08 NOTE — Telephone Encounter (Signed)
Onetouch Verio test strips was sent into [facility].

## 2017-04-19 DIAGNOSIS — N184 Chronic kidney disease, stage 4 (severe): Secondary | ICD-10-CM | POA: Diagnosis not present

## 2017-04-19 DIAGNOSIS — E1129 Type 2 diabetes mellitus with other diabetic kidney complication: Secondary | ICD-10-CM | POA: Diagnosis not present

## 2017-04-19 DIAGNOSIS — I10 Essential (primary) hypertension: Secondary | ICD-10-CM | POA: Diagnosis not present

## 2017-04-19 DIAGNOSIS — K7460 Unspecified cirrhosis of liver: Secondary | ICD-10-CM | POA: Diagnosis not present

## 2017-04-21 DIAGNOSIS — G4733 Obstructive sleep apnea (adult) (pediatric): Secondary | ICD-10-CM | POA: Diagnosis not present

## 2017-04-21 DIAGNOSIS — M1A09X0 Idiopathic chronic gout, multiple sites, without tophus (tophi): Secondary | ICD-10-CM | POA: Diagnosis not present

## 2017-04-21 DIAGNOSIS — K760 Fatty (change of) liver, not elsewhere classified: Secondary | ICD-10-CM | POA: Diagnosis not present

## 2017-04-21 DIAGNOSIS — N183 Chronic kidney disease, stage 3 (moderate): Secondary | ICD-10-CM | POA: Diagnosis not present

## 2017-05-02 ENCOUNTER — Other Ambulatory Visit: Payer: Self-pay | Admitting: Family Medicine

## 2017-05-03 HISTORY — PX: LASIK: SHX215

## 2017-05-04 DIAGNOSIS — G4733 Obstructive sleep apnea (adult) (pediatric): Secondary | ICD-10-CM | POA: Diagnosis not present

## 2017-05-06 DIAGNOSIS — G4733 Obstructive sleep apnea (adult) (pediatric): Secondary | ICD-10-CM | POA: Diagnosis not present

## 2017-05-28 ENCOUNTER — Ambulatory Visit (INDEPENDENT_AMBULATORY_CARE_PROVIDER_SITE_OTHER): Payer: BLUE CROSS/BLUE SHIELD | Admitting: Family Medicine

## 2017-05-28 ENCOUNTER — Encounter: Payer: Self-pay | Admitting: Family Medicine

## 2017-05-28 VITALS — BP 170/80 | HR 72 | Temp 99.9°F | Resp 17 | Wt 303.4 lb

## 2017-05-28 DIAGNOSIS — Z794 Long term (current) use of insulin: Secondary | ICD-10-CM

## 2017-05-28 DIAGNOSIS — G4733 Obstructive sleep apnea (adult) (pediatric): Secondary | ICD-10-CM | POA: Diagnosis not present

## 2017-05-28 DIAGNOSIS — E1140 Type 2 diabetes mellitus with diabetic neuropathy, unspecified: Secondary | ICD-10-CM

## 2017-05-28 MED ORDER — AMOXICILLIN-POT CLAVULANATE 875-125 MG PO TABS: 1.0000 | ORAL_TABLET | Freq: Two times a day (BID) | ORAL | 0 refills | Status: DC

## 2017-05-28 NOTE — Progress Notes (Signed)
Patient ID: Patrick Hurst, male   DOB: 05-Oct-1955, 62 y.o.   MRN: [id number] Name: ASHBY MOSKAL   MRN: [id number]    DOB: 08-13-1955   Date:05/28/2017       Progress Note  Subjective  Chief Complaint  Chief Complaint  Patient presents with  . Headache  . Cough     Headache   This is a new problem. The current episode started in the past 7 days. The problem occurs intermittently. The problem has been unchanged. The pain is located in the bilateral region. The pain does not radiate. The pain quality is not similar to prior headaches. The quality of the pain is described as throbbing. The pain is moderate. Associated symptoms include coughing, drainage, ear pain (patient states pain is behind left ear and is sore to the touch, patient states that he has noticed a knot by his ear lobe), rhinorrhea, sinus pressure and tinnitus. Pertinent negatives include no abdominal pain, abnormal behavior, anorexia, back pain, blurred vision, dizziness, eye pain, eye redness, eye watering, facial sweating, fever (patient denies fever outside of office), hearing loss, insomnia, loss of balance, muscle aches, nausea, neck pain, numbness, phonophobia, photophobia, scalp tenderness, seizures, sore throat, swollen glands, tingling, visual change, vomiting, weakness or weight loss. Nothing aggravates the symptoms. He has tried nothing for the symptoms.  Cough  This is a new problem. The current episode started in the past 7 days. The problem has been unchanged. The problem occurs constantly. The cough is non-productive. Associated symptoms include ear congestion, ear pain (patient states pain is behind left ear and is sore to the touch, patient states that he has noticed a knot by his ear lobe), headaches, nasal congestion, postnasal drip, rhinorrhea and shortness of breath. Pertinent negatives include no chest pain, chills, eye redness, fever (patient denies fever outside of office), heartburn, hemoptysis, myalgias, sore  throat, sweats, weight loss or wheezing. The symptoms are aggravated by cold air. He has tried nothing for the symptoms.     No problem-specific Assessment & Plan notes found for this encounter.   Past Medical History:  Diagnosis Date  . Allergy   . Anemia   . Arthritis   . Bell's palsy   . Depression   . Diabetes ([place])   . Gastric ulcer   . GERD (gastroesophageal reflux disease)   . Gout   . History of chicken pox   . Hyperlipidemia   . Hypertension   . Hypothyroidism   . OSA (obstructive sleep apnea)   . Thyroid disease   . Vitamin D deficiency     Social History   Tobacco Use  . Smoking status: Former Smoker    Packs/day: 1.00    Years: 15.00    Pack years: 15.00    Types: Cigarettes    Last attempt to quit: 05/03/1978    Years since quitting: 39.0  . Smokeless tobacco: Former Systems developer  . Tobacco comment: started smoking at age 65  Substance Use Topics  . Alcohol use: No    Alcohol/week: 0.0 oz     Current Outpatient Medications:  .  amLODipine (NORVASC) 10 MG tablet, Take 1 tablet (10 mg total) by mouth daily., Disp: 90 tablet, Rfl: 3 .  Ascorbic Acid (VITAMIN C) 1000 MG tablet, Take 1,000 mg by mouth daily., Disp: , Rfl:  .  aspirin 81 MG tablet, Take 81 mg by mouth daily., Disp: , Rfl:  .  atorvastatin (LIPITOR) 40 MG tablet, Take 1 tablet (40 mg  total) by mouth daily., Disp: 90 tablet, Rfl: 4 .  B-D INS SYR ULTRAFINE 1CC/30G 30G X 1/2" 1 ML MISC, , Disp: , Rfl:  .  Calcium Carbonate (CALCIUM 600 PO), Take 1 tablet by mouth daily., Disp: , Rfl:  .  CETIRIZINE HCL PO, Take by mouth., Disp: , Rfl:  .  Chlorpheniramine Maleate (ALLERGY RELIEF PO), Take 1 tablet by mouth daily., Disp: , Rfl:  .  Cholecalciferol (VITAMIN D3 PO), Take by mouth 2 (two) times daily. , Disp: , Rfl:  .  Cyanocobalamin (B-12 PO), Take by mouth daily., Disp: , Rfl:  .  ferrous sulfate 325 (65 FE) MG tablet, Take 325 mg by mouth daily with breakfast., Disp: , Rfl:  .  glipiZIDE (GLIPIZIDE  XL) 2.5 MG 24 hr tablet, Take 1 tablet (2.5 mg total) by mouth daily with breakfast., Disp: 90 tablet, Rfl: 3 .  glucose blood (ONE TOUCH ULTRA TEST) test strip, USE 1 STRIP TWICE A DAY, Disp: 200 each, Rfl: 4 .  glucose blood (ONETOUCH VERIO) test strip, Use as instructed, Disp: 100 each, Rfl: 12 .  hydrochlorothiazide (HYDRODIURIL) 50 MG tablet, TAKE 1 TABLET DAILY, Disp: 90 tablet, Rfl: 4 .  insulin regular human CONCENTRATED (HUMULIN R) 500 UNIT/ML injection, Takes 25 units in the am and 25 units in the pm, Disp: 60 mL, Rfl: 3 .  levothyroxine (SYNTHROID, LEVOTHROID) 100 MCG tablet, TAKE ONE TABLET BY MOUTH ONCE DAILY, Disp: 30 tablet, Rfl: 12 .  lisinopril (PRINIVIL,ZESTRIL) 20 MG tablet, , Disp: , Rfl:  .  meloxicam (MOBIC) 7.5 MG tablet, 1 tab once a day with food as needed only for severe knee pain, Disp: , Rfl:  .  metFORMIN (GLUCOPHAGE) 1000 MG tablet, Take 1 tablet (1,000 mg total) by mouth 2 (two) times daily., Disp: 180 tablet, Rfl: 4 .  methylPREDNISolone (MEDROL) 4 MG tablet, Take as directed, Disp: 21 tablet, Rfl: 0 .  metoprolol tartrate (LOPRESSOR) 50 MG tablet, TAKE 1 TABLET TWICE A DAY, Disp: 180 tablet, Rfl: 4 .  MITIGARE 0.6 MG CAPS, , Disp: , Rfl:  .  pantoprazole (PROTONIX) 40 MG tablet, TAKE 1 TABLET TWICE A DAY, Disp: 180 tablet, Rfl: 4 .  POTASSIUM PO, Take by mouth daily., Disp: , Rfl:  .  testosterone cypionate (DEPO-TESTOSTERONE) 200 MG/ML injection, Inject 1 mL (200 mg total) into the muscle every 14 (fourteen) days., Disp: 10 mL, Rfl: 1 .  ULORIC 40 MG tablet, , Disp: , Rfl:  .  albuterol (PROVENTIL HFA;VENTOLIN HFA) 108 (90 Base) MCG/ACT inhaler, Inhale 2 puffs into the lungs every 4 (four) hours as needed for wheezing or shortness of breath. (Patient not taking: Reported on 05/28/2017), Disp: 1 Inhaler, Rfl: 2  Allergies  Allergen Reactions  . Mucinex  [Guaifenesin Er]     Throat swelling, increases heart rate  . Levaquin [Levofloxacin] Palpitations    Review  of Systems  Constitutional: Negative for chills, fever (patient denies fever outside of office) and weight loss.  HENT: Positive for ear pain (patient states pain is behind left ear and is sore to the touch, patient states that he has noticed a knot by his ear lobe), postnasal drip, rhinorrhea, sinus pressure and tinnitus. Negative for hearing loss and sore throat.   Eyes: Negative for blurred vision, photophobia, pain and redness.  Respiratory: Positive for cough and shortness of breath. Negative for hemoptysis and wheezing.   Cardiovascular: Negative for chest pain.  Gastrointestinal: Negative for abdominal pain, anorexia, heartburn, nausea and  vomiting.  Musculoskeletal: Negative for back pain, myalgias and neck pain.  Neurological: Positive for headaches. Negative for dizziness, tingling, seizures, weakness, numbness and loss of balance.  Psychiatric/Behavioral: The patient does not have insomnia.       Objective  Vitals:   05/28/17 1108  BP: (!) 170/80  Pulse: 72  Resp: 17  Temp: 99.9 F (37.7 C)  TempSrc: Oral  SpO2: 92%  Weight: (!) 303 lb 6.4 oz (137.6 kg)     Physical Exam  Constitutional: He is oriented to person, place, and time and well-developed, well-nourished, and in no distress.  Obese WM NAD.  HENT:  Head: Normocephalic and atraumatic.  Right Ear: External ear normal.  Left Ear: External ear normal.  Nose: Nose normal.  Left pinna swollen and tender to touch. No obvious drainage.  Eyes: Conjunctivae are normal. No scleral icterus.  Neck: No thyromegaly present.  Cardiovascular: Normal rate, regular rhythm and normal heart sounds.  Pulmonary/Chest: Effort normal and breath sounds normal.  Abdominal: Soft.  Neurological: He is alert and oriented to person, place, and time. GCS score is 15.  Skin: Skin is warm and dry.  Psychiatric: Mood, memory, affect and judgment normal.  Pt in no distress.    No results found for this or any previous visit (from the  past 2160 hour(s)).   Assessment & Plan Cellulitis Cover with Augmentin. RTC or ED if this worsens. Obestiy TIIDM  I have done the exam and reviewed the chart and it is accurate to the best of my knowledge. Development worker, community has been used and  any errors in dictation or transcription are unintentional. Miguel Aschoff M.D. [facility]

## 2017-05-30 ENCOUNTER — Other Ambulatory Visit: Payer: Self-pay | Admitting: *Deleted

## 2017-05-30 DIAGNOSIS — R7989 Other specified abnormal findings of blood chemistry: Secondary | ICD-10-CM

## 2017-05-30 MED ORDER — TESTOSTERONE CYPIONATE 200 MG/ML IM SOLN: 200.0000 mg | INTRAMUSCULAR | 1 refills | Status: DC

## 2017-06-04 DIAGNOSIS — G4733 Obstructive sleep apnea (adult) (pediatric): Secondary | ICD-10-CM | POA: Diagnosis not present

## 2017-06-10 ENCOUNTER — Ambulatory Visit (INDEPENDENT_AMBULATORY_CARE_PROVIDER_SITE_OTHER): Payer: BLUE CROSS/BLUE SHIELD | Admitting: Family Medicine

## 2017-06-10 DIAGNOSIS — Z23 Encounter for immunization: Secondary | ICD-10-CM

## 2017-06-10 NOTE — Progress Notes (Signed)
Patient came in today for immunizations only. Patient tolerated well with no adverse reactions.

## 2017-06-23 ENCOUNTER — Ambulatory Visit: Payer: BLUE CROSS/BLUE SHIELD | Admitting: Family Medicine

## 2017-06-23 ENCOUNTER — Ambulatory Visit
Admission: RE | Admit: 2017-06-23 | Discharge: 2017-06-23 | Disposition: A | Discharge status: Home / Self Care | Payer: BLUE CROSS/BLUE SHIELD | Source: Ambulatory Visit | Attending: Family Medicine | Admitting: Family Medicine

## 2017-06-23 ENCOUNTER — Encounter: Payer: Self-pay | Admitting: Family Medicine

## 2017-06-23 VITALS — BP 140/70 | HR 63 | Temp 98.6°F | Resp 16 | Wt 309.0 lb

## 2017-06-23 DIAGNOSIS — R0602 Shortness of breath: Secondary | ICD-10-CM | POA: Insufficient documentation

## 2017-06-23 DIAGNOSIS — J40 Bronchitis, not specified as acute or chronic: Secondary | ICD-10-CM | POA: Diagnosis not present

## 2017-06-23 DIAGNOSIS — R05 Cough: Secondary | ICD-10-CM | POA: Diagnosis not present

## 2017-06-23 DIAGNOSIS — R059 Cough, unspecified: Secondary | ICD-10-CM

## 2017-06-23 DIAGNOSIS — L309 Dermatitis, unspecified: Secondary | ICD-10-CM | POA: Diagnosis not present

## 2017-06-23 MED ORDER — TRIAMCINOLONE ACETONIDE 0.1 % EX LOTN: 1.0000 "application " | TOPICAL_LOTION | Freq: Three times a day (TID) | CUTANEOUS | 0 refills | Status: DC

## 2017-06-23 MED ORDER — DOXYCYCLINE HYCLATE 100 MG PO TABS: 100.0000 mg | ORAL_TABLET | Freq: Two times a day (BID) | ORAL | 0 refills | Status: DC

## 2017-06-23 NOTE — Progress Notes (Addendum)
Patient: Patrick Hurst Male    DOB: 05-Nov-1955   62 y.o.   MRN: [id number] Visit Date: 06/23/2017  Today's Provider: Lelon Huh, MD   Chief Complaint  Patient presents with  . Cough   Subjective:    Patient has had cough and congestion for 1 1/2 weeks. Patient states his cough is only slightly productive. Cough is worse at night when he is lying down. Other symptoms include: ear congestion, nasal congestion, shortness of breath and wheezing. Patient has been taking otc vitamin C.   Also patient was seen by Dr. Rosanna Randy for cough on 05/28/2017. Patient was given amoxicillin, which he completed. Patient states he thought he was beeter, however the cough came back.    Cough  This is a recurrent problem. The current episode started 1 to 4 weeks ago (1 1/2 weeks). The problem has been unchanged. The cough is productive of sputum. Associated symptoms include ear congestion, nasal congestion, postnasal drip, shortness of breath and wheezing. Pertinent negatives include no chest pain, chills, ear pain, fever, headaches, heartburn, hemoptysis, myalgias, rash, rhinorrhea, sore throat, sweats or weight loss. The symptoms are aggravated by lying down and exercise. Treatments tried: vitamin C. The treatment provided no relief. His past medical history is significant for bronchitis and environmental allergies. There is no history of asthma, bronchiectasis, COPD, emphysema or pneumonia.   Wt Readings from Last 3 Encounters:  06/23/17 (!) 309 lb (140.2 kg)  05/28/17 (!) 303 lb 6.4 oz (137.6 kg)  12/08/16 291 lb (132 kg)    He also has dry patch across left anterior lower leg which is somewhat itchy.      Allergies  Allergen Reactions  . Mucinex  [Guaifenesin Er]     Throat swelling, increases heart rate  . Levaquin [Levofloxacin] Palpitations     Current Outpatient Medications:  .  albuterol (PROVENTIL HFA;VENTOLIN HFA) 108 (90 Base) MCG/ACT inhaler, Inhale 2 puffs into the lungs  every 4 (four) hours as needed for wheezing or shortness of breath., Disp: 1 Inhaler, Rfl: 2 .  amLODipine (NORVASC) 10 MG tablet, Take 1 tablet (10 mg total) by mouth daily., Disp: 90 tablet, Rfl: 3 .  Ascorbic Acid (VITAMIN C) 1000 MG tablet, Take 1,000 mg by mouth daily., Disp: , Rfl:  .  aspirin 81 MG tablet, Take 81 mg by mouth daily., Disp: , Rfl:  .  atorvastatin (LIPITOR) 40 MG tablet, Take 1 tablet (40 mg total) by mouth daily., Disp: 90 tablet, Rfl: 4 .  B-D INS SYR ULTRAFINE 1CC/30G 30G X 1/2" 1 ML MISC, , Disp: , Rfl:  .  Calcium Carbonate (CALCIUM 600 PO), Take 1 tablet by mouth daily., Disp: , Rfl:  .  CETIRIZINE HCL PO, Take by mouth., Disp: , Rfl:  .  Chlorpheniramine Maleate (ALLERGY RELIEF PO), Take 1 tablet by mouth daily., Disp: , Rfl:  .  Cholecalciferol (VITAMIN D3 PO), Take by mouth 2 (two) times daily. , Disp: , Rfl:  .  Cyanocobalamin (B-12 PO), Take by mouth daily., Disp: , Rfl:  .  ferrous sulfate 325 (65 FE) MG tablet, Take 325 mg by mouth daily with breakfast., Disp: , Rfl:  .  glipiZIDE (GLIPIZIDE XL) 2.5 MG 24 hr tablet, Take 1 tablet (2.5 mg total) by mouth daily with breakfast., Disp: 90 tablet, Rfl: 3 .  glucose blood (ONE TOUCH ULTRA TEST) test strip, USE 1 STRIP TWICE A DAY, Disp: 200 each, Rfl: 4 .  glucose blood (  ONETOUCH VERIO) test strip, Use as instructed, Disp: 100 each, Rfl: 12 .  hydrochlorothiazide (HYDRODIURIL) 50 MG tablet, TAKE 1 TABLET DAILY, Disp: 90 tablet, Rfl: 4 .  insulin regular human CONCENTRATED (HUMULIN R) 500 UNIT/ML injection, Takes 25 units in the am and 25 units in the pm, Disp: 60 mL, Rfl: 3 .  levothyroxine (SYNTHROID, LEVOTHROID) 100 MCG tablet, TAKE ONE TABLET BY MOUTH ONCE DAILY, Disp: 30 tablet, Rfl: 12 .  lisinopril (PRINIVIL,ZESTRIL) 20 MG tablet, , Disp: , Rfl:  .  meloxicam (MOBIC) 7.5 MG tablet, 1 tab once a day with food as needed only for severe knee pain, Disp: , Rfl:  .  metFORMIN (GLUCOPHAGE) 1000 MG tablet, Take 1  tablet (1,000 mg total) by mouth 2 (two) times daily., Disp: 180 tablet, Rfl: 4 .  metoprolol tartrate (LOPRESSOR) 50 MG tablet, TAKE 1 TABLET TWICE A DAY, Disp: 180 tablet, Rfl: 4 .  MITIGARE 0.6 MG CAPS, , Disp: , Rfl:  .  pantoprazole (PROTONIX) 40 MG tablet, TAKE 1 TABLET TWICE A DAY, Disp: 180 tablet, Rfl: 4 .  POTASSIUM PO, Take by mouth daily., Disp: , Rfl:  .  testosterone cypionate (DEPO-TESTOSTERONE) 200 MG/ML injection, Inject 1 mL (200 mg total) into the muscle every 14 (fourteen) days., Disp: 10 mL, Rfl: 1 .  ULORIC 40 MG tablet, , Disp: , Rfl:  .  amoxicillin-clavulanate (AUGMENTIN) 875-125 MG tablet, Take 1 tablet by mouth 2 (two) times daily. (Patient not taking: Reported on 06/23/2017), Disp: 20 tablet, Rfl: 0 .  methylPREDNISolone (MEDROL) 4 MG tablet, Take as directed (Patient not taking: Reported on 06/23/2017), Disp: 21 tablet, Rfl: 0  Review of Systems  Constitutional: Negative for appetite change, chills, fever and weight loss.  HENT: Positive for congestion, postnasal drip and sneezing. Negative for ear pain, rhinorrhea, sinus pressure, sinus pain, sore throat and trouble swallowing.   Respiratory: Positive for cough, shortness of breath and wheezing. Negative for hemoptysis and chest tightness.   Cardiovascular: Negative for chest pain and palpitations.  Gastrointestinal: Negative for abdominal pain, heartburn, nausea and vomiting.  Musculoskeletal: Negative for myalgias.  Skin: Negative for rash.  Allergic/Immunologic: Positive for environmental allergies.  Neurological: Negative for headaches.    Social History   Tobacco Use  . Smoking status: Former Smoker    Packs/day: 1.00    Years: 15.00    Pack years: 15.00    Types: Cigarettes    Last attempt to quit: 05/03/1978    Years since quitting: 39.1  . Smokeless tobacco: Former Systems developer  . Tobacco comment: started smoking at age 12  Substance Use Topics  . Alcohol use: No    Alcohol/week: 0.0 oz   Objective:    BP 140/70 (BP Location: Right Arm, Patient Position: Sitting, Cuff Size: Large)   Pulse 63   Temp 98.6 F (37 C) (Oral)   Resp 16   Wt (!) 309 lb (140.2 kg)   SpO2 95%   BMI 43.10 kg/m  Vitals:   06/23/17 0949  BP: 140/70  Pulse: 63  Resp: 16  Temp: 98.6 F (37 C)  TempSrc: Oral  SpO2: 95%  Weight: (!) 309 lb (140.2 kg)     Physical Exam  General Appearance:    Alert, cooperative, no distress  HENT:   ENT exam normal, no neck nodes or sinus tenderness  Eyes:    PERRL, conjunctiva/corneas clear, EOM's intact       Lungs:     Clear to auscultation bilaterally, respirations unlabored,  faint bibasilar rales  Heart:    Regular rate and rhythm, I/VI systolic murmur  Neurologic:   Awake, alert, oriented x 3. No apparent focal neurological           defect.      CXR. No acute processes     Assessment & Plan:     1. Cough  - DG Chest 2 View; Future  2. Shortness of breath   3. Bronchitis  - doxycycline (VIBRA-TABS) 100 MG tablet; Take 1 tablet (100 mg total) by mouth 2 (two) times daily.  Dispense: 20 tablet; Refill: 0  4. Eczema, unspecified type  - triamcinolone lotion (KENALOG) 0.1 %; Apply 1 application topically 3 (three) times daily.  Dispense: 60 mL; Refill: 0       Lelon Huh, MD  [facility]

## 2017-06-23 NOTE — Patient Instructions (Signed)
Go to the [facility] on [address] for chest Xray  

## 2017-06-27 DIAGNOSIS — N184 Chronic kidney disease, stage 4 (severe): Secondary | ICD-10-CM | POA: Diagnosis not present

## 2017-06-27 DIAGNOSIS — R809 Proteinuria, unspecified: Secondary | ICD-10-CM | POA: Diagnosis not present

## 2017-06-27 DIAGNOSIS — E1129 Type 2 diabetes mellitus with other diabetic kidney complication: Secondary | ICD-10-CM | POA: Diagnosis not present

## 2017-06-27 DIAGNOSIS — I10 Essential (primary) hypertension: Secondary | ICD-10-CM | POA: Diagnosis not present

## 2017-07-02 DIAGNOSIS — G4733 Obstructive sleep apnea (adult) (pediatric): Secondary | ICD-10-CM | POA: Diagnosis not present

## 2017-07-26 DIAGNOSIS — M1A09X0 Idiopathic chronic gout, multiple sites, without tophus (tophi): Secondary | ICD-10-CM | POA: Diagnosis not present

## 2017-07-26 DIAGNOSIS — K760 Fatty (change of) liver, not elsewhere classified: Secondary | ICD-10-CM | POA: Diagnosis not present

## 2017-07-26 DIAGNOSIS — N183 Chronic kidney disease, stage 3 (moderate): Secondary | ICD-10-CM | POA: Diagnosis not present

## 2017-08-02 DIAGNOSIS — G4733 Obstructive sleep apnea (adult) (pediatric): Secondary | ICD-10-CM | POA: Diagnosis not present

## 2017-09-01 DIAGNOSIS — G4733 Obstructive sleep apnea (adult) (pediatric): Secondary | ICD-10-CM | POA: Diagnosis not present

## 2017-09-05 DIAGNOSIS — M1A09X0 Idiopathic chronic gout, multiple sites, without tophus (tophi): Secondary | ICD-10-CM | POA: Diagnosis not present

## 2017-09-12 ENCOUNTER — Other Ambulatory Visit: Payer: Self-pay | Admitting: Family Medicine

## 2017-09-16 DIAGNOSIS — N184 Chronic kidney disease, stage 4 (severe): Secondary | ICD-10-CM | POA: Diagnosis not present

## 2017-09-16 DIAGNOSIS — R809 Proteinuria, unspecified: Secondary | ICD-10-CM | POA: Diagnosis not present

## 2017-09-16 DIAGNOSIS — I10 Essential (primary) hypertension: Secondary | ICD-10-CM | POA: Diagnosis not present

## 2017-09-16 DIAGNOSIS — E1129 Type 2 diabetes mellitus with other diabetic kidney complication: Secondary | ICD-10-CM | POA: Diagnosis not present

## 2017-09-27 ENCOUNTER — Other Ambulatory Visit: Payer: Self-pay | Admitting: Family Medicine

## 2017-09-27 DIAGNOSIS — R0609 Other forms of dyspnea: Principal | ICD-10-CM

## 2017-09-27 DIAGNOSIS — R0600 Dyspnea, unspecified: Secondary | ICD-10-CM

## 2017-09-30 DIAGNOSIS — H35372 Puckering of macula, left eye: Secondary | ICD-10-CM | POA: Diagnosis not present

## 2017-09-30 DIAGNOSIS — E083493 Diabetes mellitus due to underlying condition with severe nonproliferative diabetic retinopathy without macular edema, bilateral: Secondary | ICD-10-CM | POA: Diagnosis not present

## 2017-09-30 DIAGNOSIS — E113492 Type 2 diabetes mellitus with severe nonproliferative diabetic retinopathy without macular edema, left eye: Secondary | ICD-10-CM | POA: Diagnosis not present

## 2017-09-30 DIAGNOSIS — Z961 Presence of intraocular lens: Secondary | ICD-10-CM | POA: Diagnosis not present

## 2017-10-02 DIAGNOSIS — G4733 Obstructive sleep apnea (adult) (pediatric): Secondary | ICD-10-CM | POA: Diagnosis not present

## 2017-10-12 ENCOUNTER — Other Ambulatory Visit: Payer: Self-pay | Admitting: Family Medicine

## 2017-10-12 DIAGNOSIS — Z794 Long term (current) use of insulin: Principal | ICD-10-CM

## 2017-10-12 DIAGNOSIS — E1140 Type 2 diabetes mellitus with diabetic neuropathy, unspecified: Secondary | ICD-10-CM

## 2017-10-17 DIAGNOSIS — H3582 Retinal ischemia: Secondary | ICD-10-CM | POA: Diagnosis not present

## 2017-10-17 DIAGNOSIS — E113542 Type 2 diabetes mellitus with proliferative diabetic retinopathy with combined traction retinal detachment and rhegmatogenous retinal detachment, left eye: Secondary | ICD-10-CM | POA: Diagnosis not present

## 2017-10-26 ENCOUNTER — Other Ambulatory Visit: Payer: Self-pay | Admitting: Family Medicine

## 2017-10-26 NOTE — Telephone Encounter (Signed)
[facility] faxed a refill request for a 90-days supply for the following medication. Thanks [facility]  atorvastatin (LIPITOR) 40 MG tablet

## 2017-10-27 MED ORDER — ATORVASTATIN CALCIUM 40 MG PO TABS: 40.0000 mg | ORAL_TABLET | Freq: Every day | ORAL | 0 refills | Status: DC

## 2017-11-01 DIAGNOSIS — G4733 Obstructive sleep apnea (adult) (pediatric): Secondary | ICD-10-CM | POA: Diagnosis not present

## 2017-11-07 DIAGNOSIS — H43811 Vitreous degeneration, right eye: Secondary | ICD-10-CM | POA: Diagnosis not present

## 2017-11-10 DIAGNOSIS — Z01818 Encounter for other preprocedural examination: Secondary | ICD-10-CM | POA: Diagnosis not present

## 2017-11-10 DIAGNOSIS — E1139 Type 2 diabetes mellitus with other diabetic ophthalmic complication: Secondary | ICD-10-CM | POA: Diagnosis not present

## 2017-11-10 DIAGNOSIS — H4312 Vitreous hemorrhage, left eye: Secondary | ICD-10-CM | POA: Diagnosis not present

## 2017-11-14 ENCOUNTER — Other Ambulatory Visit: Payer: Self-pay | Admitting: Family Medicine

## 2017-11-14 DIAGNOSIS — R0600 Dyspnea, unspecified: Secondary | ICD-10-CM

## 2017-11-14 DIAGNOSIS — R0609 Other forms of dyspnea: Principal | ICD-10-CM

## 2017-11-14 MED ORDER — ALBUTEROL SULFATE HFA 108 (90 BASE) MCG/ACT IN AERS: INHALATION_SPRAY | RESPIRATORY_TRACT | 5 refills | Status: DC

## 2017-11-14 NOTE — Telephone Encounter (Signed)
[facility] faxed a refill request for a 90-days supply for the following medication. Thanks [facility]  PROAIR HFA 108 (90 Base) MCG/ACT inhaler

## 2017-11-17 NOTE — Progress Notes (Addendum)
Patient: Patrick Hurst, Male    DOB: 12/14/1955, 62 y.o.   MRN: [id number] Visit Date: 11/18/2017  Today's Provider: Lelon Huh, MD   Chief Complaint  Patient presents with  . Annual Exam  . Hypertension  . Diabetes  . Hypothyroidism  . Hyperlipidemia   Subjective:    Annual physical exam Patrick Hurst is a 62 y.o. male who presents today for health maintenance and complete physical. He feels fairly well. He reports exercising 2 times a week. He reports he is sleeping fairly well.  -----------------------------------------------------------------  Diabetes Mellitus Type II, Follow-up:   Lab Results  Component Value Date   HGBA1C 5.5 12/08/2016   HGBA1C 5.9 07/19/2016   HGBA1C 4.8 02/17/2016   Last seen for diabetes 11 months ago.  Management since then includes; no changes. Changes since last ov, started glipizide XL 2.5 mg qd. He reports good compliance with treatment. Patient states he is no longer taking Metformin. He is not having side effects.  Current symptoms include paresthesia of the feet and polydipsia and have been stable. Home blood sugar records: fasting range: 180's  Episodes of hypoglycemia? no   Current Insulin Regimen: 30 units in the mornings, then 40 units night Most Recent Eye Exam: <1 year ago Weight trend: decreasing steadily Prior visit with dietician: no Current diet: well balanced Current exercise: walking  ------------------------------------------------------------------------   Hypertension, follow-up:  BP Readings from Last 3 Encounters:  11/18/17 (!) 116/56  06/23/17 140/70  05/28/17 (!) 170/80    He was last seen for hypertension 11 months ago.  BP at that visit was 124/78. Management since that visit includes no changes. Changes since last ov, increased amlodipine from 5 mg to 10 mg qd.He reports good compliance with treatment. He is not having side effects.  He is exercising. He is adherent to low salt diet.     Outside blood pressures are checked occasionally. He is experiencing dyspnea, fatigue and lower extremity edema.  Patient denies chest pain, chest pressure/discomfort, claudication, exertional chest pressure/discomfort, irregular heart beat, near-syncope, palpitations, paroxysmal nocturnal dyspnea, syncope and tachypnea.   Cardiovascular risk factors include advanced age (older than 55 for men, 72 for women), diabetes mellitus, hypertension and male gender.  Use of agents associated with hypertension: NSAIDS.   ------------------------------------------------------------------------    Lipid/Cholesterol, Follow-up:   Last seen for this 11 months ago.  Management since that visit includes; labs checked, no changes.  Last Lipid Panel:    Component Value Date/Time   CHOL 124 12/08/2016 1025   CHOL 180 03/14/2012 0321   TRIG 681 (HH) 12/08/2016 1025   TRIG 594 (H) 03/14/2012 0321   HDL 19 (L) 12/08/2016 1025   HDL 21 (L) 03/14/2012 0321   CHOLHDL 6.5 (H) 12/08/2016 1025   VLDL SEE COMMENT 03/14/2012 0321   LDLCALC Comment 12/08/2016 1025   LDLCALC SEE COMMENT 03/14/2012 0321    He reports good compliance with treatment. He is not having side effects.   Wt Readings from Last 3 Encounters:  11/18/17 299 lb (135.6 kg)  06/23/17 (!) 309 lb (140.2 kg)  05/28/17 (!) 303 lb 6.4 oz (137.6 kg)    ------------------------------------------------------------------------  Hypothyroidism, unspecified type From 12/08/2016-labs checked, no changes. Patient reports good compliance with treatment.  TSH at that time was 3.150       Review of Systems  Constitutional: Positive for fatigue. Negative for appetite change, chills and fever.  HENT: Negative for congestion, ear pain, hearing loss, nosebleeds  and trouble swallowing.   Eyes: Negative for pain and visual disturbance.  Respiratory: Positive for apnea, cough and shortness of breath. Negative for chest tightness.   Cardiovascular:  Positive for leg swelling. Negative for chest pain and palpitations.  Gastrointestinal: Positive for diarrhea. Negative for abdominal pain, blood in stool, constipation, nausea and vomiting.  Endocrine: Positive for polydipsia. Negative for polyphagia and polyuria.  Genitourinary: Negative for dysuria and flank pain.  Musculoskeletal: Positive for arthralgias. Negative for back pain, joint swelling, myalgias and neck stiffness.  Skin: Negative for color change, rash and wound.  Neurological: Positive for numbness. Negative for dizziness, tremors, seizures, speech difficulty, weakness, light-headedness and headaches.  Psychiatric/Behavioral: Negative for behavioral problems, confusion, decreased concentration, dysphoric mood and sleep disturbance. The patient is not nervous/anxious.   All other systems reviewed and are negative.   Social History      He  reports that he quit smoking about 39 years ago. His smoking use included cigarettes. He has a 15.00 pack-year smoking history. He has quit using smokeless tobacco. He reports that he does not drink alcohol or use drugs.       Social History   Socioeconomic History  . Marital status: Married    Spouse name: Not on file  . Number of children: Not on file  . Years of education: Not on file  . Highest education level: Not on file  Occupational History  . Occupation: Works at [employer]    Comment: Full time  Social Needs  . Financial resource strain: Not on file  . Food insecurity:    Worry: Not on file    Inability: Not on file  . Transportation needs:    Medical: Not on file    Non-medical: Not on file  Tobacco Use  . Smoking status: Former Smoker    Packs/day: 1.00    Years: 15.00    Pack years: 15.00    Types: Cigarettes    Last attempt to quit: 05/03/1978    Years since quitting: 39.5  . Smokeless tobacco: Former Systems developer  . Tobacco comment: started smoking at age 45  Substance and Sexual Activity  . Alcohol use: No     Alcohol/week: 0.0 oz  . Drug use: No  . Sexual activity: Not on file  Lifestyle  . Physical activity:    Days per week: Not on file    Minutes per session: Not on file  . Stress: Not on file  Relationships  . Social connections:    Talks on phone: Not on file    Gets together: Not on file    Attends religious service: Not on file    Active member of club or organization: Not on file    Attends meetings of clubs or organizations: Not on file    Relationship status: Not on file  Other Topics Concern  . Not on file  Social History Narrative  . Not on file    Past Medical History:  Diagnosis Date  . Allergy   . Anemia   . Arthritis   . Bell's palsy   . Depression   . Diabetes ([place])   . Gastric ulcer   . GERD (gastroesophageal reflux disease)   . Gout   . History of chicken pox   . Hyperlipidemia   . Hypertension   . Hypothyroidism   . OSA (obstructive sleep apnea)   . Thyroid disease   . Vitamin D deficiency      Patient Active Problem  List   Diagnosis Date Noted  . Psoriasis (a type of skin inflammation) 10/19/2016  . Bilateral anterior knee pain 04/06/2016  . Primary osteoarthritis of both knees 10/01/2015  . Morbid (severe) obesity due to excess calories ([place]) 10/01/2015  . Hepatomegaly 09/19/2015  . Splenomegaly 09/19/2015  . ANA positive 07/21/2015  . Hepatic fibrosis 04/16/2015  . Charcot ankle 04/02/2015  . Thrombocytopenia ([place]) 02/14/2015  . Allergic rhinitis 02/12/2015  . Bell palsy 02/12/2015  . Carpal tunnel syndrome 02/12/2015  . Clinical depression 02/12/2015  . Diabetes mellitus with neuropathy ([place]) 02/12/2015  . Edema 02/12/2015  . Erectile dysfunction 02/12/2015  . HLD (hyperlipidemia) 02/12/2015  . Hypertension 02/12/2015  . Hypothyroidism 02/12/2015  . Anemia, iron deficiency 02/12/2015  . Low testosterone 02/12/2015  . Neuropathy 02/12/2015  . Obesity 02/12/2015  . Peripheral neuropathy 02/12/2015  . Obstructive sleep apnea  02/12/2015  . Snores 02/12/2015  . Gout 05/05/2012  . Gastric ulcer 11/21/2009    Past Surgical History:  Procedure Laterality Date  . CATARACT EXTRACTION Bilateral 2014 and 2015  . CHOLECYSTECTOMY  04/28/2011   Laproscopic; Dr. Pat Patrick  . GALLBLADDER SURGERY    . SHOULDER SURGERY Right 2012   Dr. Leanor Kail    Family History        Family Status  Relation Name Status  . Mother  Deceased  . Father  Alive        His family history includes COPD in his mother; Heart disease in his father.      Allergies  Allergen Reactions  . Mucinex  [Guaifenesin Er]     Throat swelling, increases heart rate  . Levaquin [Levofloxacin] Palpitations     Current Outpatient Medications:  .  albuterol (PROAIR HFA) 108 (90 Base) MCG/ACT inhaler, USE 2 INHALATIONS EVERY 4 HOURS AS NEEDED FOR WHEEZING OR SHORTNESS OF BREATH, Disp: 8.5 g, Rfl: 5 .  amLODipine (NORVASC) 10 MG tablet, Take 1 tablet (10 mg total) by mouth daily., Disp: 90 tablet, Rfl: 3 .  Ascorbic Acid (VITAMIN C) 1000 MG tablet, Take 1,000 mg by mouth daily., Disp: , Rfl:  .  aspirin 81 MG tablet, Take 81 mg by mouth daily., Disp: , Rfl:  .  atorvastatin (LIPITOR) 40 MG tablet, Take 1 tablet (40 mg total) by mouth daily., Disp: 90 tablet, Rfl: 0 .  B-D INS SYR ULTRAFINE 1CC/30G 30G X 1/2" 1 ML MISC, , Disp: , Rfl:  .  Calcium Carbonate (CALCIUM 600 PO), Take 1 tablet by mouth daily., Disp: , Rfl:  .  CETIRIZINE HCL PO, Take by mouth., Disp: , Rfl:  .  Chlorpheniramine Maleate (ALLERGY RELIEF PO), Take 1 tablet by mouth daily., Disp: , Rfl:  .  Cholecalciferol (VITAMIN D3 PO), Take by mouth 2 (two) times daily. , Disp: , Rfl:  .  Cyanocobalamin (B-12 PO), Take by mouth daily., Disp: , Rfl:  .  ferrous sulfate 325 (65 FE) MG tablet, Take 325 mg by mouth daily with breakfast., Disp: , Rfl:  .  glipiZIDE (GLIPIZIDE XL) 2.5 MG 24 hr tablet, Take 1 tablet (2.5 mg total) by mouth daily with breakfast., Disp: 90 tablet, Rfl: 3 .   glucose blood (ONE TOUCH ULTRA TEST) test strip, USE 1 STRIP TWICE A DAY, Disp: 200 each, Rfl: 4 .  glucose blood (ONETOUCH VERIO) test strip, Use as instructed, Disp: 100 each, Rfl: 12 .  hydrochlorothiazide (HYDRODIURIL) 50 MG tablet, TAKE 1 TABLET DAILY, Disp: 90 tablet, Rfl: 4 .  insulin regular  human CONCENTRATED (HUMULIN R) 500 UNIT/ML injection, DRAW TO THE 25 UNIT MARK IN A U100 SYRINGE AND INJECT IN THE MORNING AND 25 UNIT MARK IN THE EVENING, Disp: 60 mL, Rfl: 5 .  levothyroxine (SYNTHROID, LEVOTHROID) 100 MCG tablet, TAKE ONE TABLET BY MOUTH ONCE DAILY, Disp: 30 tablet, Rfl: 12 .  lisinopril (PRINIVIL,ZESTRIL) 20 MG tablet, , Disp: , Rfl:  .  metoprolol tartrate (LOPRESSOR) 50 MG tablet, TAKE 1 TABLET TWICE A DAY, Disp: 180 tablet, Rfl: 4 .  MITIGARE 0.6 MG CAPS, , Disp: , Rfl:  .  testosterone cypionate (DEPO-TESTOSTERONE) 200 MG/ML injection, Inject 1 mL (200 mg total) into the muscle every 14 (fourteen) days., Disp: 10 mL, Rfl: 1 .  triamcinolone lotion (KENALOG) 0.1 %, Apply 1 application topically 3 (three) times daily., Disp: 60 mL, Rfl: 0 .  ULORIC 40 MG tablet, , Disp: , Rfl:  .  meloxicam (MOBIC) 7.5 MG tablet, 1 tab once a day with food as needed only for severe knee pain, Disp: , Rfl:  .  metFORMIN (GLUCOPHAGE) 1000 MG tablet, Take 1 tablet (1,000 mg total) by mouth 2 (two) times daily. (Patient not taking: Reported on 11/18/2017), Disp: 180 tablet, Rfl: 4 .  methylPREDNISolone (MEDROL) 4 MG tablet, Take as directed (Patient not taking: Reported on 06/23/2017), Disp: 21 tablet, Rfl: 0 .  pantoprazole (PROTONIX) 40 MG tablet, TAKE 1 TABLET TWICE A DAY (Patient not taking: Reported on 11/18/2017), Disp: 180 tablet, Rfl: 4   Patient Care Team: [name], MD as PCP - General (Family Medicine) Lucilla Lame, MD as Consulting Physician (Gastroenterology)      Objective:   Vitals: BP (!) 116/56 (BP Location: Left Arm, Patient Position: Sitting, Cuff Size: Large)   Pulse 60    Temp 98.6 F (37 C) (Oral)   Resp 16   Ht 5\' 11"  (1.803 m)   Wt 299 lb (135.6 kg)   SpO2 95% Comment: room air  BMI 41.70 kg/m    Vitals:   11/18/17 0915  BP: (!) 116/56  Pulse: 60  Resp: 16  Temp: 98.6 F (37 C)  TempSrc: Oral  SpO2: 95%  Weight: 299 lb (135.6 kg)  Height: 5\' 11"  (1.803 m)     Physical Exam   General Appearance:    Alert, cooperative, no distress, appears stated age, morbidly obese  Head:    Normocephalic, without obvious abnormality, atraumatic  Eyes:    PERRL,, EOM's intact, fundi    benign, right conjunctiva moderately injection. Bilateral photosensitivity.        Ears:    Normal TM's and external ear canals, both ears  Nose:   Nares normal, septum midline, mucosa normal, no drainage   or sinus tenderness  Throat:   Lips, mucosa, and tongue normal; teeth and gums normal  Neck:   Supple, symmetrical, trachea midline, no adenopathy;       thyroid:  No enlargement/tenderness/nodules; no carotid   bruit or JVD  Back:     Symmetric, no curvature, ROM normal, no CVA tenderness  Lungs:     Clear to auscultation bilaterally, respirations unlabored  Chest wall:    No tenderness or deformity  Heart:    Regular rate and rhythm, S1 and S2 normal, no murmur, rub   or gallop  Abdomen:     Soft, non-tender, bowel sounds active all four quadrants,    no masses, no organomegaly  Genitalia:    deferred  Rectal:    deferred  Extremities:  Extremities normal, atraumatic, no cyanosis or edema  Pulses:   2+ and symmetric all extremities  Skin:   Skin color, texture, turgor normal, no rashes or lesions  Lymph nodes:   Cervical, supraclavicular, and axillary nodes normal  Neurologic:   CNII-XII intact. Normal strength, sensation and reflexes      throughout     Depression Screen PHQ 2/9 Scores 11/18/2017 12/08/2016 02/16/2016 02/12/2015  PHQ - 2 Score 2 0 2 4  PHQ- 9 Score 5 2 12 11       Assessment & Plan:     Routine Health Maintenance and Physical  Exam  Exercise Activities and Dietary recommendations Goals    None      Immunization History  Administered Date(s) Administered  . Hepatitis A, Adult 12/08/2016, 06/10/2017  . Hepatitis B, adult 12/08/2016, 01/10/2017, 06/10/2017  . Influenza, Seasonal, Injecte, Preservative Fre 03/13/2007, 01/22/2008, 03/17/2009, 03/18/2010, 02/01/2011  . Influenza,inj,Quad PF,6+ Mos 02/08/2013, 01/18/2014, 02/12/2015, 02/16/2016, 06/10/2017  . Pneumococcal Polysaccharide-23 05/03/2000, 06/23/2009  . Td 12/08/2016  . Tdap 12/30/2006    Health Maintenance  Topic Date Due  . FOOT EXAM  04/06/1967  . HIV Screening  04/05/1972  . HEMOGLOBIN A1C  06/10/2017  . OPHTHALMOLOGY EXAM  11/01/2017  . INFLUENZA VACCINE  12/01/2017  . COLONOSCOPY  11/25/2019  . TETANUS/TDAP  12/09/2026  . PNEUMOCOCCAL POLYSACCHARIDE VACCINE  Completed  . Hepatitis C Screening  Completed     Discussed health benefits of physical activity, and encouraged him to engage in regular exercise appropriate for his age and condition.    --------------------------------------------------------------------  1. Annual physical exam   2. DOE (dyspnea on exertion)  - Brain natriuretic peptide - Tiotropium Bromide-Olodaterol (STIOLTO RESPIMAT) 2.5-2.5 MCG/ACT AERS; Inhale 2 puffs into the lungs daily.  Dispense: 1 Inhaler; Refill: 0 - Brain natriuretic peptide  3. Prostate cancer screening  - PSA  4. Type 2 diabetes mellitus with diabetic neuropathy, with long-term current use of insulin (HCC)  - Hemoglobin A1c - EKG 12-Lead  5. Hypertension, unspecified type Well controlled.  Continue current medications.   - EKG 12-Lead  6. Low testosterone  - Testosterone,Free and Total  7. Hyperlipidemia, unspecified hyperlipidemia type He is tolerating atorvastatin well with no adverse effects.   - Comprehensive metabolic panel - Lipid panel  8. 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  Hypothyroidism, unspecified type  - TSH  10. Morbid (severe) obesity due to excess calories ([place]) .diet and exercise as tolerated.    Lelon Huh, MD  [facility]

## 2017-11-18 ENCOUNTER — Ambulatory Visit (INDEPENDENT_AMBULATORY_CARE_PROVIDER_SITE_OTHER): Payer: BLUE CROSS/BLUE SHIELD | Admitting: Family Medicine

## 2017-11-18 ENCOUNTER — Encounter: Payer: Self-pay | Admitting: Family Medicine

## 2017-11-18 VITALS — BP 116/56 | HR 60 | Temp 98.6°F | Resp 16 | Ht 71.0 in | Wt 299.0 lb

## 2017-11-18 DIAGNOSIS — R7989 Other specified abnormal findings of blood chemistry: Secondary | ICD-10-CM | POA: Diagnosis not present

## 2017-11-18 DIAGNOSIS — E1140 Type 2 diabetes mellitus with diabetic neuropathy, unspecified: Secondary | ICD-10-CM

## 2017-11-18 DIAGNOSIS — E785 Hyperlipidemia, unspecified: Secondary | ICD-10-CM | POA: Diagnosis not present

## 2017-11-18 DIAGNOSIS — Z794 Long term (current) use of insulin: Secondary | ICD-10-CM

## 2017-11-18 DIAGNOSIS — D696 Thrombocytopenia, unspecified: Secondary | ICD-10-CM

## 2017-11-18 DIAGNOSIS — Z125 Encounter for screening for malignant neoplasm of prostate: Secondary | ICD-10-CM | POA: Diagnosis not present

## 2017-11-18 DIAGNOSIS — I10 Essential (primary) hypertension: Secondary | ICD-10-CM | POA: Diagnosis not present

## 2017-11-18 DIAGNOSIS — Z0000 Encounter for general adult medical examination without abnormal findings: Secondary | ICD-10-CM | POA: Diagnosis not present

## 2017-11-18 DIAGNOSIS — E039 Hypothyroidism, unspecified: Secondary | ICD-10-CM

## 2017-11-18 DIAGNOSIS — R0600 Dyspnea, unspecified: Secondary | ICD-10-CM

## 2017-11-18 DIAGNOSIS — H4312 Vitreous hemorrhage, left eye: Secondary | ICD-10-CM | POA: Diagnosis not present

## 2017-11-18 DIAGNOSIS — R0609 Other forms of dyspnea: Secondary | ICD-10-CM

## 2017-11-18 MED ORDER — TIOTROPIUM BROMIDE-OLODATEROL 2.5-2.5 MCG/ACT IN AERS: 2.0000 | INHALATION_SPRAY | Freq: Every day | RESPIRATORY_TRACT | 0 refills | Status: DC

## 2017-11-18 MED ORDER — AMITRIPTYLINE HCL 10 MG PO TABS: ORAL_TABLET | ORAL | 1 refills | Status: DC

## 2017-11-18 MED ORDER — PANTOPRAZOLE SODIUM 40 MG PO TBEC: 40.0000 mg | DELAYED_RELEASE_TABLET | Freq: Two times a day (BID) | ORAL | 4 refills | Status: DC

## 2017-11-18 NOTE — Patient Instructions (Signed)
Start taking Stiolto 2 puffs once a day. You can still take albuterol inhaler as needed in addition to [employer].

## 2017-11-19 ENCOUNTER — Inpatient Hospital Stay: Payer: BLUE CROSS/BLUE SHIELD

## 2017-11-19 ENCOUNTER — Other Ambulatory Visit: Payer: Self-pay

## 2017-11-19 ENCOUNTER — Encounter: Payer: Self-pay | Admitting: Emergency Medicine

## 2017-11-19 ENCOUNTER — Inpatient Hospital Stay
Admission: EM | Admit: 2017-11-19 | Discharge: 2017-11-21 | DRG: 641 | Disposition: A | Discharge status: Home / Self Care | Payer: BLUE CROSS/BLUE SHIELD | Attending: Internal Medicine | Admitting: Internal Medicine

## 2017-11-19 DIAGNOSIS — E039 Hypothyroidism, unspecified: Secondary | ICD-10-CM | POA: Diagnosis present

## 2017-11-19 DIAGNOSIS — Z79899 Other long term (current) drug therapy: Secondary | ICD-10-CM

## 2017-11-19 DIAGNOSIS — K760 Fatty (change of) liver, not elsewhere classified: Secondary | ICD-10-CM | POA: Diagnosis present

## 2017-11-19 DIAGNOSIS — K219 Gastro-esophageal reflux disease without esophagitis: Secondary | ICD-10-CM | POA: Diagnosis not present

## 2017-11-19 DIAGNOSIS — E872 Acidosis: Secondary | ICD-10-CM | POA: Diagnosis present

## 2017-11-19 DIAGNOSIS — E785 Hyperlipidemia, unspecified: Secondary | ICD-10-CM | POA: Diagnosis present

## 2017-11-19 DIAGNOSIS — E1122 Type 2 diabetes mellitus with diabetic chronic kidney disease: Secondary | ICD-10-CM | POA: Diagnosis present

## 2017-11-19 DIAGNOSIS — Z9841 Cataract extraction status, right eye: Secondary | ICD-10-CM

## 2017-11-19 DIAGNOSIS — M170 Bilateral primary osteoarthritis of knee: Secondary | ICD-10-CM | POA: Diagnosis present

## 2017-11-19 DIAGNOSIS — Z6841 Body Mass Index (BMI) 40.0 and over, adult: Secondary | ICD-10-CM

## 2017-11-19 DIAGNOSIS — I129 Hypertensive chronic kidney disease with stage 1 through stage 4 chronic kidney disease, or unspecified chronic kidney disease: Secondary | ICD-10-CM | POA: Diagnosis present

## 2017-11-19 DIAGNOSIS — F329 Major depressive disorder, single episode, unspecified: Secondary | ICD-10-CM | POA: Diagnosis present

## 2017-11-19 DIAGNOSIS — Z794 Long term (current) use of insulin: Secondary | ICD-10-CM | POA: Diagnosis not present

## 2017-11-19 DIAGNOSIS — N2581 Secondary hyperparathyroidism of renal origin: Secondary | ICD-10-CM | POA: Diagnosis not present

## 2017-11-19 DIAGNOSIS — D509 Iron deficiency anemia, unspecified: Secondary | ICD-10-CM | POA: Diagnosis present

## 2017-11-19 DIAGNOSIS — M1990 Unspecified osteoarthritis, unspecified site: Secondary | ICD-10-CM | POA: Diagnosis not present

## 2017-11-19 DIAGNOSIS — Z825 Family history of asthma and other chronic lower respiratory diseases: Secondary | ICD-10-CM

## 2017-11-19 DIAGNOSIS — E875 Hyperkalemia: Secondary | ICD-10-CM | POA: Diagnosis not present

## 2017-11-19 DIAGNOSIS — G4733 Obstructive sleep apnea (adult) (pediatric): Secondary | ICD-10-CM | POA: Diagnosis present

## 2017-11-19 DIAGNOSIS — N19 Unspecified kidney failure: Secondary | ICD-10-CM

## 2017-11-19 DIAGNOSIS — E1140 Type 2 diabetes mellitus with diabetic neuropathy, unspecified: Secondary | ICD-10-CM

## 2017-11-19 DIAGNOSIS — Z8711 Personal history of peptic ulcer disease: Secondary | ICD-10-CM

## 2017-11-19 DIAGNOSIS — R600 Localized edema: Secondary | ICD-10-CM | POA: Diagnosis present

## 2017-11-19 DIAGNOSIS — Z7982 Long term (current) use of aspirin: Secondary | ICD-10-CM

## 2017-11-19 DIAGNOSIS — Z7952 Long term (current) use of systemic steroids: Secondary | ICD-10-CM

## 2017-11-19 DIAGNOSIS — N184 Chronic kidney disease, stage 4 (severe): Secondary | ICD-10-CM | POA: Diagnosis present

## 2017-11-19 DIAGNOSIS — E1121 Type 2 diabetes mellitus with diabetic nephropathy: Secondary | ICD-10-CM | POA: Diagnosis present

## 2017-11-19 DIAGNOSIS — E119 Type 2 diabetes mellitus without complications: Secondary | ICD-10-CM | POA: Diagnosis not present

## 2017-11-19 DIAGNOSIS — D631 Anemia in chronic kidney disease: Secondary | ICD-10-CM | POA: Diagnosis present

## 2017-11-19 DIAGNOSIS — N179 Acute kidney failure, unspecified: Secondary | ICD-10-CM | POA: Diagnosis not present

## 2017-11-19 DIAGNOSIS — E559 Vitamin D deficiency, unspecified: Secondary | ICD-10-CM | POA: Diagnosis not present

## 2017-11-19 DIAGNOSIS — Z79890 Hormone replacement therapy (postmenopausal): Secondary | ICD-10-CM

## 2017-11-19 DIAGNOSIS — Z9049 Acquired absence of other specified parts of digestive tract: Secondary | ICD-10-CM

## 2017-11-19 DIAGNOSIS — N281 Cyst of kidney, acquired: Secondary | ICD-10-CM | POA: Diagnosis not present

## 2017-11-19 DIAGNOSIS — Z9842 Cataract extraction status, left eye: Secondary | ICD-10-CM | POA: Diagnosis not present

## 2017-11-19 DIAGNOSIS — M109 Gout, unspecified: Secondary | ICD-10-CM | POA: Diagnosis present

## 2017-11-19 DIAGNOSIS — K7460 Unspecified cirrhosis of liver: Secondary | ICD-10-CM | POA: Diagnosis present

## 2017-11-19 DIAGNOSIS — Z888 Allergy status to other drugs, medicaments and biological substances status: Secondary | ICD-10-CM | POA: Diagnosis not present

## 2017-11-19 DIAGNOSIS — E1129 Type 2 diabetes mellitus with other diabetic kidney complication: Secondary | ICD-10-CM | POA: Diagnosis not present

## 2017-11-19 DIAGNOSIS — Z87891 Personal history of nicotine dependence: Secondary | ICD-10-CM

## 2017-11-19 DIAGNOSIS — I10 Essential (primary) hypertension: Secondary | ICD-10-CM | POA: Diagnosis not present

## 2017-11-19 DIAGNOSIS — K30 Functional dyspepsia: Secondary | ICD-10-CM | POA: Diagnosis present

## 2017-11-19 DIAGNOSIS — E11319 Type 2 diabetes mellitus with unspecified diabetic retinopathy without macular edema: Secondary | ICD-10-CM | POA: Diagnosis present

## 2017-11-19 DIAGNOSIS — Z8249 Family history of ischemic heart disease and other diseases of the circulatory system: Secondary | ICD-10-CM

## 2017-11-19 DIAGNOSIS — R630 Anorexia: Secondary | ICD-10-CM | POA: Diagnosis present

## 2017-11-19 LAB — URINALYSIS, COMPLETE (UACMP) WITH MICROSCOPIC
BILIRUBIN URINE: NEGATIVE
Bacteria, UA: NONE SEEN
Glucose, UA: NEGATIVE mg/dL
Hgb urine dipstick: NEGATIVE
KETONES UR: NEGATIVE mg/dL
Leukocytes, UA: NEGATIVE
NITRITE: NEGATIVE
Protein, ur: 30 mg/dL — AB
SPECIFIC GRAVITY, URINE: 1.008 (ref 1.005–1.030)
WBC, UA: NONE SEEN WBC/hpf (ref 0–5)
pH: 5 (ref 5.0–8.0)

## 2017-11-19 LAB — CBC WITH DIFFERENTIAL/PLATELET
Basophils Absolute: 0.1 10*3/uL (ref 0–0.1)
Basophils Relative: 1 %
Eosinophils Absolute: 0.2 10*3/uL (ref 0–0.7)
Eosinophils Relative: 4 %
HCT: 28.2 % — ABNORMAL LOW (ref 40.0–52.0)
HEMOGLOBIN: 9.9 g/dL — AB (ref 13.0–18.0)
LYMPHS PCT: 20 %
Lymphs Abs: 0.9 10*3/uL — ABNORMAL LOW (ref 1.0–3.6)
MCH: 31.3 pg (ref 26.0–34.0)
MCHC: 35.1 g/dL (ref 32.0–36.0)
MCV: 89.2 fL (ref 80.0–100.0)
Monocytes Absolute: 0.4 10*3/uL (ref 0.2–1.0)
Monocytes Relative: 9 %
Neutro Abs: 3.1 10*3/uL (ref 1.4–6.5)
Neutrophils Relative %: 66 %
Platelets: 80 10*3/uL — ABNORMAL LOW (ref 150–440)
RBC: 3.17 MIL/uL — AB (ref 4.40–5.90)
RDW: 16.1 % — ABNORMAL HIGH (ref 11.5–14.5)
WBC: 4.7 10*3/uL (ref 3.8–10.6)

## 2017-11-19 LAB — GLUCOSE, CAPILLARY
GLUCOSE-CAPILLARY: 111 mg/dL — AB (ref 70–99)
Glucose-Capillary: 59 mg/dL — ABNORMAL LOW (ref 70–99)
Glucose-Capillary: 84 mg/dL (ref 70–99)

## 2017-11-19 LAB — BASIC METABOLIC PANEL
Anion gap: 9 (ref 5–15)
Anion gap: 10 (ref 5–15)
BUN: 114 mg/dL — ABNORMAL HIGH (ref 6–20)
BUN: 111 mg/dL — ABNORMAL HIGH (ref 6–20)
CALCIUM: 8.3 mg/dL — AB (ref 8.9–10.3)
CO2: 18 mmol/L — ABNORMAL LOW (ref 22–32)
CO2: 18 mmol/L — ABNORMAL LOW (ref 22–32)
CREATININE: 3.83 mg/dL — AB (ref 0.61–1.24)
CREATININE: 3.6 mg/dL — AB (ref 0.61–1.24)
Calcium: 7.9 mg/dL — ABNORMAL LOW (ref 8.9–10.3)
Chloride: 111 mmol/L (ref 98–111)
Chloride: 113 mmol/L — ABNORMAL HIGH (ref 98–111)
GFR calc Af Amer: 18 mL/min — ABNORMAL LOW (ref >60)
GFR calc non Af Amer: 17 mL/min — ABNORMAL LOW (ref >60)
GFR, EST AFRICAN AMERICAN: 20 mL/min — AB (ref >60)
GFR, EST NON AFRICAN AMERICAN: 16 mL/min — AB (ref >60)
GLUCOSE: 204 mg/dL — AB (ref 70–99)
Glucose, Bld: 97 mg/dL (ref 70–99)
POTASSIUM: 5.8 mmol/L — AB (ref 3.5–5.1)
Potassium: 5.6 mmol/L — ABNORMAL HIGH (ref 3.5–5.1)
SODIUM: 138 mmol/L (ref 135–145)
SODIUM: 141 mmol/L (ref 135–145)

## 2017-11-19 LAB — PROTEIN / CREATININE RATIO, URINE
CREATININE, URINE: 52 mg/dL
Protein Creatinine Ratio: 0.62 mg/mg{Cre} — ABNORMAL HIGH (ref 0.00–0.15)
TOTAL PROTEIN, URINE: 32 mg/dL

## 2017-11-19 MED ORDER — PANTOPRAZOLE SODIUM 40 MG PO TBEC
40.0000 mg | DELAYED_RELEASE_TABLET | Freq: Two times a day (BID) | ORAL | Status: DC
  Administered 2017-11-19 – 2017-11-21 (×4): 40 mg via ORAL
  Filled 2017-11-19 (×4): qty 1

## 2017-11-19 MED ORDER — VITAMIN C 500 MG PO TABS
1000.0000 mg | ORAL_TABLET | Freq: Two times a day (BID) | ORAL | Status: DC
  Administered 2017-11-19 – 2017-11-21 (×4): 1000 mg via ORAL
  Filled 2017-11-19 (×5): qty 2

## 2017-11-19 MED ORDER — INSULIN REGULAR HUMAN (CONC) 500 UNIT/ML ~~LOC~~ SOLN: 30.0000 [IU] | Freq: Two times a day (BID) | SUBCUTANEOUS | Status: DC

## 2017-11-19 MED ORDER — ALBUTEROL SULFATE (2.5 MG/3ML) 0.083% IN NEBU
2.5000 mg | INHALATION_SOLUTION | Freq: Once | RESPIRATORY_TRACT | Status: AC
  Administered 2017-11-19: 2.5 mg via RESPIRATORY_TRACT
  Filled 2017-11-19: qty 3

## 2017-11-19 MED ORDER — ACETAMINOPHEN 325 MG PO TABS: 650.0000 mg | ORAL_TABLET | Freq: Four times a day (QID) | ORAL | Status: DC | PRN

## 2017-11-19 MED ORDER — SODIUM CHLORIDE 0.9 % IV SOLN
Freq: Once | INTRAVENOUS | Status: AC
  Administered 2017-11-19: 16:00:00 via INTRAVENOUS

## 2017-11-19 MED ORDER — CALCIUM CARBONATE 1500 (600 CA) MG PO TABS
600.0000 mg | ORAL_TABLET | Freq: Every day | ORAL | Status: DC
Start: 2017-11-20 — End: 2017-11-21
  Administered 2017-11-20: 10:00:00 1500 mg via ORAL
  Filled 2017-11-19 (×2): qty 1

## 2017-11-19 MED ORDER — PNEUMOCOCCAL VAC POLYVALENT 25 MCG/0.5ML IJ INJ: 0.5000 mL | INJECTION | INTRAMUSCULAR | Status: DC

## 2017-11-19 MED ORDER — SODIUM BICARBONATE 8.4 % IV SOLN
50.0000 meq | Freq: Once | INTRAVENOUS | Status: AC
  Administered 2017-11-19: 50 meq via INTRAVENOUS
  Filled 2017-11-19: qty 50

## 2017-11-19 MED ORDER — HEPARIN SODIUM (PORCINE) 5000 UNIT/ML IJ SOLN
5000.0000 [IU] | Freq: Three times a day (TID) | INTRAMUSCULAR | Status: DC
  Administered 2017-11-19 – 2017-11-21 (×5): 5000 [IU] via SUBCUTANEOUS
  Filled 2017-11-19 (×5): qty 1

## 2017-11-19 MED ORDER — SODIUM CHLORIDE 0.9 % IV BOLUS
500.0000 mL | Freq: Once | INTRAVENOUS | Status: AC
  Administered 2017-11-19: 500 mL via INTRAVENOUS

## 2017-11-19 MED ORDER — GLIPIZIDE ER 2.5 MG PO TB24: 2.5000 mg | ORAL_TABLET | Freq: Every day | ORAL | Status: DC

## 2017-11-19 MED ORDER — PATIROMER SORBITEX CALCIUM 8.4 G PO PACK
8.4000 g | PACK | Freq: Every day | ORAL | Status: DC
  Administered 2017-11-19 – 2017-11-21 (×3): 8.4 g via ORAL
  Filled 2017-11-19 (×3): qty 1

## 2017-11-19 MED ORDER — METOPROLOL TARTRATE 50 MG PO TABS
50.0000 mg | ORAL_TABLET | Freq: Two times a day (BID) | ORAL | Status: DC
  Administered 2017-11-19 – 2017-11-21 (×4): 50 mg via ORAL
  Filled 2017-11-19 (×4): qty 1

## 2017-11-19 MED ORDER — DEXTROSE 50 % IV SOLN
1.0000 | Freq: Once | INTRAVENOUS | Status: AC
  Administered 2017-11-19: 50 mL via INTRAVENOUS
  Filled 2017-11-19: qty 50

## 2017-11-19 MED ORDER — ATORVASTATIN CALCIUM 20 MG PO TABS
40.0000 mg | ORAL_TABLET | Freq: Every day | ORAL | Status: DC
  Administered 2017-11-19 – 2017-11-20 (×2): 40 mg via ORAL
  Filled 2017-11-19 (×2): qty 2

## 2017-11-19 MED ORDER — INSULIN ASPART 100 UNIT/ML ~~LOC~~ SOLN
5.0000 [IU] | Freq: Once | SUBCUTANEOUS | Status: AC
  Administered 2017-11-19: 5 [IU] via INTRAVENOUS
  Filled 2017-11-19: qty 1

## 2017-11-19 MED ORDER — VITAMIN D3 25 MCG (1000 UNIT) PO TABS
1000.0000 [IU] | ORAL_TABLET | Freq: Every day | ORAL | Status: DC
  Administered 2017-11-19 – 2017-11-21 (×3): 1000 [IU] via ORAL
  Filled 2017-11-19 (×6): qty 1

## 2017-11-19 MED ORDER — INSULIN ASPART 100 UNIT/ML ~~LOC~~ SOLN: 30.0000 [IU] | Freq: Two times a day (BID) | SUBCUTANEOUS | Status: DC

## 2017-11-19 MED ORDER — ACETAMINOPHEN 650 MG RE SUPP: 650.0000 mg | Freq: Four times a day (QID) | RECTAL | Status: DC | PRN

## 2017-11-19 MED ORDER — SENNA 8.6 MG PO TABS
1.0000 | ORAL_TABLET | Freq: Two times a day (BID) | ORAL | Status: DC
  Filled 2017-11-19 (×4): qty 1

## 2017-11-19 MED ORDER — ALBUTEROL SULFATE (2.5 MG/3ML) 0.083% IN NEBU
3.0000 mL | INHALATION_SOLUTION | Freq: Four times a day (QID) | RESPIRATORY_TRACT | Status: DC | PRN
Start: 2017-11-19 — End: 2017-11-21

## 2017-11-19 MED ORDER — AMLODIPINE BESYLATE 10 MG PO TABS
10.0000 mg | ORAL_TABLET | Freq: Every day | ORAL | Status: DC
  Administered 2017-11-20 – 2017-11-21 (×2): 10 mg via ORAL
  Filled 2017-11-19 (×2): qty 1

## 2017-11-19 MED ORDER — AMITRIPTYLINE HCL 10 MG PO TABS
10.0000 mg | ORAL_TABLET | Freq: Every evening | ORAL | Status: DC | PRN
  Filled 2017-11-19: qty 1

## 2017-11-19 MED ORDER — VITAMIN B-12 1000 MCG PO TABS
1000.0000 ug | ORAL_TABLET | Freq: Every day | ORAL | Status: DC
  Administered 2017-11-20 – 2017-11-21 (×2): 1000 ug via ORAL
  Filled 2017-11-19 (×2): qty 1

## 2017-11-19 MED ORDER — ASPIRIN EC 81 MG PO TBEC
81.0000 mg | DELAYED_RELEASE_TABLET | Freq: Every day | ORAL | Status: DC
  Administered 2017-11-20 – 2017-11-21 (×2): 81 mg via ORAL
  Filled 2017-11-19 (×2): qty 1

## 2017-11-19 MED ORDER — LEVOTHYROXINE SODIUM 100 MCG PO TABS
100.0000 ug | ORAL_TABLET | Freq: Every day | ORAL | Status: DC
  Administered 2017-11-20 – 2017-11-21 (×2): 100 ug via ORAL
  Filled 2017-11-19 (×2): qty 1

## 2017-11-19 MED ORDER — INSULIN ASPART 100 UNIT/ML ~~LOC~~ SOLN
0.0000 [IU] | Freq: Three times a day (TID) | SUBCUTANEOUS | Status: DC
  Administered 2017-11-20: 12:00:00 2 [IU] via SUBCUTANEOUS
  Administered 2017-11-20: 08:00:00 1 [IU] via SUBCUTANEOUS
  Administered 2017-11-20: 18:00:00 3 [IU] via SUBCUTANEOUS
  Administered 2017-11-21: 2 [IU] via SUBCUTANEOUS
  Filled 2017-11-19 (×3): qty 1

## 2017-11-19 MED ORDER — CALCIUM GLUCONATE 10 % IV SOLN
1.0000 g | Freq: Once | INTRAVENOUS | Status: AC
  Administered 2017-11-19: 1 g via INTRAVENOUS
  Filled 2017-11-19: qty 10

## 2017-11-19 NOTE — ED Triage Notes (Addendum)
Pt to ed with c/o abnormal labs from yesterday at Dr. Maralyn Sago office.  Pt states he was called this am and told to come to ED for potassium of 7.  Pt denies CP, denies rapid heart rate, denies sob, denies dizziness.  Pt does report lethargy and lack of energy. Pt alert and oriented and appears in NAD.

## 2017-11-19 NOTE — Progress Notes (Signed)
[facility]  ROUNDING NOTE   Subjective:   [name] admitted to [facility] on 11/19/2017 for Hyperkalemia [E87.5] Uremia [N19]  Patient went to see his PCP, Dr. Caryn Section, for his annual examination yesterday. Labs were drawn which showed hyperkalemia and uremia. Patient was asked to present to the ED where his potassium has improved to 5.8 and creatinine down to 3.83. Started on IV fluids  Patient presents with wife and daughter who assist with history taking.   States he has been feeling more tired and sleepy as of late. He has been having more indigestion and poor appetite.   Patient has completed Kidney smart class a few months ago and states he learned about the different dialysis modalities.   Objective:  Vital signs in last 24 hours:  Temp:  [98.2 F (36.8 C)-98.7 F (37.1 C)] 98.2 F (36.8 C) (07/20 1515) Pulse Rate:  [58-70] 58 (07/20 1515) Resp:  [16-20] 20 (07/20 1515) BP: (125-154)/(59-85) 125/85 (07/20 1515) SpO2:  [94 %-98 %] 98 % (07/20 1515) Weight:  [135.6 kg (299 lb)] 135.6 kg (299 lb) (07/20 1158)  Weight change:  Filed Weights   11/19/17 1158  Weight: 135.6 kg (299 lb)    Intake/Output: No intake/output data recorded.   Intake/Output this shift:  Total I/O In: 500 [IV Piggyback:500] Out: -   Physical Exam: General: NAD, laying in bed  Head: Normocephalic, atraumatic. Moist oral mucosal membranes  Eyes: Anicteric, PERRL  Neck: Supple, trachea midline  Lungs:  Clear to auscultation  Heart: Regular rate and rhythm  Abdomen:  Soft, nontender,   Extremities: no peripheral edema.  Neurologic: Nonfocal, moving all four extremities  Skin: No lesions  Access: none    Basic Metabolic Panel: Recent Labs  Lab 11/18/17 1012 11/19/17 1200  NA 135 138  K 7.0* 5.8*  CL 104 111  CO2 16* 18*  GLUCOSE 160* 204*  BUN 100* 114*  CREATININE 4.06* 3.83*  CALCIUM 8.1* 7.9*    Liver Function Tests: Recent Labs  Lab 11/18/17 1012  AST  25  ALT 34  ALKPHOS 82  BILITOT 0.4  PROT 7.3  ALBUMIN 4.3   No results for input(s): LIPASE, AMYLASE in the last 168 hours. No results for input(s): AMMONIA in the last 168 hours.  CBC: Recent Labs  Lab 11/19/17 1200  WBC 4.7  NEUTROABS 3.1  HGB 9.9*  HCT 28.2*  MCV 89.2  PLT 80*    Cardiac Enzymes: No results for input(s): CKTOTAL, CKMB, CKMBINDEX, TROPONINI in the last 168 hours.  BNP: Invalid input(s): POCBNP  CBG: No results for input(s): GLUCAP in the last 168 hours.  Microbiology: No results found for this or any previous visit.  Coagulation Studies: No results for input(s): LABPROT, INR in the last 72 hours.  Urinalysis: Recent Labs    11/19/17 1437  COLORURINE STRAW*  LABSPEC 1.008  PHURINE 5.0  GLUCOSEU NEGATIVE  HGBUR NEGATIVE  BILIRUBINUR NEGATIVE  KETONESUR NEGATIVE  PROTEINUR 30*  NITRITE NEGATIVE  LEUKOCYTESUR NEGATIVE      Imaging: US Renal  Result Date: 11/19/2017 CLINICAL DATA:  Renal failure, hyperkalemia EXAM: RENAL / URINARY TRACT ULTRASOUND COMPLETE COMPARISON:  09/24/2015 FINDINGS: Right Kidney: Length: 12.4 cm. Echogenicity within normal limits. No mass or hydronephrosis visualized. Left Kidney: Length: 12.1 cm. Normal echogenicity. Small anechoic cortical cyst in the lower pole measures 14 mm. No hydronephrosis. Bladder: Appears normal for degree of bladder distention. IMPRESSION: No acute finding by renal ultrasound. Negative for hydronephrosis. Eldred  mm left kidney lower pole renal cyst Electronically Signed   By: Jerilynn Mages.  Shick M.D.   On: 11/19/2017 15:04     Medications:   . sodium chloride     . [START ON 11/20/2017] amLODipine  10 mg Oral Daily  . [START ON 11/20/2017] aspirin EC  81 mg Oral Daily  . atorvastatin  40 mg Oral q1800  . B-12  1,000 mcg Oral Daily  . [START ON 11/20/2017] calcium carbonate  600 mg of elemental calcium Oral Q breakfast  . cholecalciferol  40,000 Units Oral Daily  . heparin  5,000 Units Subcutaneous  Q8H  . insulin aspart  0-9 Units Subcutaneous TID WC  . insulin regular human CONCENTRATED  30 Units Subcutaneous BID WC  . levothyroxine  100 mcg Oral Daily  . metoprolol tartrate  50 mg Oral BID  . pantoprazole  40 mg Oral BID  . patiromer  8.4 g Oral Daily  . senna  1 tablet Oral BID  . vitamin C  1,000 mg Oral BID   acetaminophen **OR** acetaminophen, albuterol, amitriptyline  Assessment/ Plan:  [name] is a 62 y.o. white male with diabetes mellitus type II insulin dependent, diabetic retinopathy, diabetic neuropathy, hyperlipidemia, obstructive sleep apnea, obesity, hypertension, hypothyroidism, non-alcoholic fatty liver disease/cirrhosis admitted to [facility] on Hyperkalemia [E87.5] Uremia [N19]  1.  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  Chronic kidney disease secondary to diabetic nephropathy. No history of biopsy. Differential included membranous glomerulonephritis or FSGS.  Acute renal failure secondary to progression of disease versus prerenal azotemia - Hold ACE-I: lisinopril. Hold diuretic: torsemide - IV fluids: NS at 165mL/hr - Discussed dialysis with patient.  - Discussed low potassium diet - Check SPEP/UPEP, spot urine protein to creatinine ratio, viral hepatitis panel  2. Hypertension: with edema. Well controlled on examination. No edema on examination - Discontinue fluid restriction   3. Diabetes mellitus type II with chronic kidney disease: insulin dependent. Hemoglobin A1c of 5.1%. History of well controlled diabetes.   4. Secondary Hyperparathyroidism with hypocalcemia: PTH 179 on 09/16/17 - Check PTH, calcium and phosphorus in AM.   5. Anemia of chronic kidney disease: hemoglobin 9.9. Normocytic.  - Discussed ESA therapy with patient.     LOS: 0 Bridie Colquhoun 7/20/20193:32 PM

## 2017-11-19 NOTE — H&P (Signed)
[facility] NAME: Patrick Hurst    MR#:  [id number]  DATE OF BIRTH:  June 01, 1955  DATE OF ADMISSION:  11/19/2017  PRIMARY CARE PHYSICIAN: [name], MD   REQUESTING/REFERRING PHYSICIAN: dr Quentin Cornwall  CHIEF COMPLAINT:  I got a phone call that my potassium is 7.0 and was asked to come to the ER  HISTORY OF PRESENT ILLNESS:  Patrick Hurst  is a 62 y.o. male with a known history of diabetes on insulin, morbid obesity on CPAP, hypertension, depression, CKD stage IV comes to the emergency room after he was found to have potassium of 7.0 with elevated BUN and creatinine on routine labs done as part of annual physical. Patient reports he's been feeling fatigued and sleepy lately however denies any chest pain shortness of breath. He came in after he received a call regarding his potassium wife and daughter are in the room.  Patient received insulin with Dextrose, Valtessa, calcium gluconate, bicarb IV. EKG showed peaked T waves in the ER. Pt is asymptomatic.  PAST MEDICAL HISTORY:   Past Medical History:  Diagnosis Date  . Allergy   . Anemia   . Arthritis   . Bell's palsy   . Depression   . Diabetes ([place])   . Gastric ulcer   . GERD (gastroesophageal reflux disease)   . Gout   . History of chicken pox   . Hyperlipidemia   . Hypertension   . Hypothyroidism   . OSA (obstructive sleep apnea)   . Thyroid disease   . Vitamin D deficiency     PAST SURGICAL HISTOIRY:   Past Surgical History:  Procedure Laterality Date  . CATARACT EXTRACTION Bilateral 2014 and 2015  . CHOLECYSTECTOMY  04/28/2011   Laproscopic; Dr. Pat Patrick  . GALLBLADDER SURGERY    . SHOULDER SURGERY Right 2012   Dr. Leanor Kail    SOCIAL HISTORY:   Social History   Tobacco Use  . Smoking status: Former Smoker    Packs/day: 1.00    Years: 15.00    Pack years: 15.00    Types: Cigarettes    Last attempt to quit: 05/03/1978    Years since  quitting: 39.5  . Smokeless tobacco: Former Systems developer  . Tobacco comment: started smoking at age 25  Substance Use Topics  . Alcohol use: No    Alcohol/week: 0.0 oz    FAMILY HISTORY:   Family History  Problem Relation Age of Onset  . COPD Mother   . Heart disease Father     DRUG ALLERGIES:   Allergies  Allergen Reactions  . Mucinex  [Guaifenesin Er]     Throat swelling, increases heart rate  . Levaquin [Levofloxacin] Palpitations    REVIEW OF SYSTEMS:  Review of Systems  Constitutional: Positive for malaise/fatigue. Negative for chills, fever and weight loss.  HENT: Negative for ear discharge, ear pain and nosebleeds.   Eyes: Negative for blurred vision, pain and discharge.  Respiratory: Negative for sputum production, shortness of breath, wheezing and stridor.   Cardiovascular: Negative for chest pain, palpitations, orthopnea and PND.  Gastrointestinal: Negative for abdominal pain, diarrhea, nausea and vomiting.  Genitourinary: Negative for frequency and urgency.  Musculoskeletal: Negative for back pain and joint pain.  Neurological: Positive for weakness. Negative for sensory change, speech change and focal weakness.  Psychiatric/Behavioral: Negative for depression and hallucinations. The patient is not nervous/anxious.      MEDICATIONS AT HOME:   Prior to Admission medications  Medication Sig Start Date End Date Taking? Authorizing Provider  albuterol (PROAIR HFA) 108 (90 Base) MCG/ACT inhaler USE 2 INHALATIONS EVERY 4 HOURS AS NEEDED FOR WHEEZING OR SHORTNESS OF BREATH 11/14/17  Yes [name], MD  amitriptyline (ELAVIL) 10 MG tablet One tablet at bedtime for 1 week for diabetic nerve pain, then increase to 2 tablets at bedtime 11/18/17  Yes [name], MD  amLODipine (NORVASC) 10 MG tablet Take 1 tablet (10 mg total) by mouth daily. 03/01/17  Yes [name], MD  Ascorbic Acid (VITAMIN C) 1000 MG tablet Take 1,000 mg by mouth 2 (two) times daily.    Yes  [provider]  aspirin 81 MG tablet Take 81 mg by mouth daily.   Yes [provider]  atorvastatin (LIPITOR) 40 MG tablet Take 1 tablet (40 mg total) by mouth daily. 10/27/17  Yes Chrismon, Vickki Muff, PA  Calcium Carbonate (CALCIUM 600 PO) Take 1 tablet by mouth daily.   Yes [provider]  cetirizine (ZYRTEC) 10 MG tablet Take 10 mg by mouth daily.    Yes [provider]  cholecalciferol (VITAMIN D) 1000 units tablet Take 1,000 mcg by mouth daily.    Yes [provider]  Cyanocobalamin (B-12 PO) Take 1,000 mcg by mouth daily.    Yes [provider]  glipiZIDE (GLIPIZIDE XL) 2.5 MG 24 hr tablet Take 1 tablet (2.5 mg total) by mouth daily with breakfast. 03/01/17  Yes Fisher, Kirstie Peri, MD  insulin regular human CONCENTRATED (HUMULIN R) 500 UNIT/ML injection DRAW TO THE 25 UNIT MARK IN A U100 SYRINGE AND INJECT IN THE MORNING AND 25 UNIT MARK IN THE EVENING Patient taking differently: Inject 30u under the skin every morning and 40u under the skin every evening 10/12/17  Yes Fisher, Kirstie Peri, MD  levothyroxine (SYNTHROID, LEVOTHROID) 100 MCG tablet TAKE ONE TABLET BY MOUTH ONCE DAILY 05/02/17  Yes [name], MD  lisinopril (PRINIVIL,ZESTRIL) 20 MG tablet Take 20 mg by mouth 2 (two) times daily.  05/16/17  Yes [provider]  metoprolol tartrate (LOPRESSOR) 50 MG tablet TAKE 1 TABLET TWICE A DAY 09/16/16  Yes [name], MD  pantoprazole (PROTONIX) 40 MG tablet Take 1 tablet (40 mg total) by mouth 2 (two) times daily. 11/18/17  Yes [name], MD  testosterone cypionate (DEPO-TESTOSTERONE) 200 MG/ML injection Inject 1 mL (200 mg total) into the muscle every 14 (fourteen) days. 05/30/17  Yes [name], MD  Tiotropium Bromide-Olodaterol (STIOLTO RESPIMAT) 2.5-2.5 MCG/ACT AERS Inhale 2 puffs into the lungs daily. 11/18/17  Yes [name], MD  torsemide (DEMADEX) 20 MG tablet Take 40 mg by mouth daily.   Yes [provider]  ULORIC 80 MG TABS Take 80 mg by mouth daily.  05/18/17  Yes [provider]  triamcinolone lotion (KENALOG) 0.1 % Apply 1 application topically 3 (three) times daily. Patient not taking: Reported on 11/19/2017 06/23/17   [name], MD      VITAL SIGNS:  Blood pressure 125/85, pulse (!) 58, temperature 98.2 F (36.8 C), temperature source Oral, resp. rate 20, height 5\' 11"  (1.803 m), weight 135.6 kg (299 lb), SpO2 98 %.  PHYSICAL EXAMINATION:  GENERAL:  62 y.o.-year-old patient lying in the bed with no acute distress. Morbidly obese EYES: Pupils equal, round, reactive to light and accommodation. No scleral icterus. Extraocular muscles intact.  HEENT: Head atraumatic, normocephalic. Oropharynx and nasopharynx clear.  NECK:  Supple, no  jugular venous distention. No thyroid enlargement, no tenderness.  LUNGS: Normal breath sounds bilaterally, no wheezing, rales,rhonchi or crepitation. No use of accessory muscles of respiration.  CARDIOVASCULAR: S1, S2 normal. No murmurs, rubs, or gallops.  ABDOMEN: Soft, nontender, nondistended. Bowel sounds present. No organomegaly or mass.  EXTREMITIES: No pedal edema, cyanosis, or clubbing.  NEUROLOGIC: Cranial nerves II through XII are intact. Muscle strength 5/5 in all extremities. Sensation intact. Gait not checked.  PSYCHIATRIC: The patient is alert and oriented x 3.  SKIN: No obvious rash, lesion, or ulcer.   LABORATORY PANEL:   CBC Recent Labs  Lab 11/19/17 1200  WBC 4.7  HGB 9.9*  HCT 28.2*  PLT 80*   ------------------------------------------------------------------------------------------------------------------  Chemistries  Recent Labs  Lab 11/18/17 1012 11/19/17 1200  NA 135 138  K 7.0* 5.8*  CL 104 111  CO2 16* 18*  GLUCOSE 160* 204*  BUN 100* 114*  CREATININE 4.06* 3.83*  CALCIUM 8.1* 7.9*  AST 25  --   ALT 34  --   ALKPHOS 82  --   BILITOT 0.4  --     ------------------------------------------------------------------------------------------------------------------  Cardiac Enzymes No results for input(s): TROPONINI in the last 168 hours. ------------------------------------------------------------------------------------------------------------------  RADIOLOGY:  US Renal  Result Date: 11/19/2017 CLINICAL DATA:  Renal failure, hyperkalemia EXAM: RENAL / URINARY TRACT ULTRASOUND COMPLETE COMPARISON:  09/24/2015 FINDINGS: Right Kidney: Length: 12.4 cm. Echogenicity within normal limits. No mass or hydronephrosis visualized. Left Kidney: Length: 12.1 cm. Normal echogenicity. Small anechoic cortical cyst in the lower pole measures 14 mm. No hydronephrosis. Bladder: Appears normal for degree of bladder distention. IMPRESSION: No acute finding by renal ultrasound. Negative for hydronephrosis. 14 mm left kidney lower pole renal cyst Electronically Signed   By: Jerilynn Mages.  Shick M.D.   On: 11/19/2017 15:04    EKG:    IMPRESSION AND PLAN:   Patrick Hurst  is a 62 y.o. male with a known history of diabetes on insulin, morbid obesity on CPAP, hypertension, depression, CKD stage IV comes to the emergency room after he was found to have potassium of 7.0 with elevated BUN and creatinine on routine labs done as part of annual physical.  1. acute hyperkalemia with EKG changes of  peakedT waves  -patient received in the ER dextrose with insulin, calcium gluconate, bicarb, valtessa -K 7.0--Rx--5.8 -repeat potassium around 6 o'clock -IV fluids -nephrology consultation -tele monitoring  2. diabetes type II -insulin and sliding scale  3. Acute on chronic kidney disease stage IV -baseline creatinine 2.5 -4.06--- 3.38  4. Morbid obesity with obstructive sleep apnea on CPAP  5. hypertension continue home meds  6. DVT prophylaxis subcu heparin    All the records are reviewed and case discussed with ED provider. Management plans discussed with the  patient, family and they are in agreement.  CODE STATUS: full  TOTAL TIME TAKING CARE OF THIS PATIENT: 50 minutes.    Fritzi Mandes M.D on 11/19/2017 at 3:16 PM  Between 7am to 6pm - Pager - [phone]  After 6pm go to [email or web address] - password [email or web address] [facility]  [facility]  Office  [phone]  CC: Primary care physician; [name], MD

## 2017-11-19 NOTE — ED Notes (Signed)
Patient taken to ultrasound.

## 2017-11-19 NOTE — ED Provider Notes (Signed)
[facility] Emergency Department Provider Note    First MD Initiated Contact with Patient 11/19/17 1214     (approximate)  I have reviewed the triage vital signs and the nursing notes.   HISTORY  Chief Complaint Other    HPI Patrick Hurst is a 62 y.o. male with a history of CKD and follows with Dr. [name] presents to the ER for abnormal labs drawn in clinic yesterday.  Patient also evidence of potassium elevation to 7 with worsening renal function.  Patient denies any medication changes.  States he does feel sluggish and fatigued for the past week or so.  Denies any chest pain.  No flank pain.  No fevers.    Past Medical History:  Diagnosis Date  . Allergy   . Anemia   . Arthritis   . Bell's palsy   . Depression   . Diabetes ([place])   . Gastric ulcer   . GERD (gastroesophageal reflux disease)   . Gout   . History of chicken pox   . Hyperlipidemia   . Hypertension   . Hypothyroidism   . OSA (obstructive sleep apnea)   . Thyroid disease   . Vitamin D deficiency    Family History  Problem Relation Age of Onset  . COPD Mother   . Heart disease Father    Past Surgical History:  Procedure Laterality Date  . CATARACT EXTRACTION Bilateral 2014 and 2015  . CHOLECYSTECTOMY  04/28/2011   Laproscopic; Dr. Pat   . GALLBLADDER SURGERY    . SHOULDER SURGERY Right 2012   Dr. Leanor Kail   Patient Active Problem List   Diagnosis Date Noted  . Psoriasis (a type of skin inflammation) 10/19/2016  . Bilateral anterior knee pain 04/06/2016  . Primary osteoarthritis of both knees 10/01/2015  . Morbid (severe) obesity due to excess calories ([place]) 10/01/2015  . Hepatomegaly 09/19/2015  . Splenomegaly 09/19/2015  . ANA positive 07/21/2015  . Hepatic fibrosis 04/16/2015  . Charcot ankle 04/02/2015  . Thrombocytopenia ([place]) 02/14/2015  . Allergic rhinitis 02/12/2015  . Bell palsy 02/12/2015  . Carpal tunnel syndrome 02/12/2015  . Clinical depression  02/12/2015  . Diabetes mellitus with neuropathy ([place]) 02/12/2015  . Edema 02/12/2015  . Erectile dysfunction 02/12/2015  . HLD (hyperlipidemia) 02/12/2015  . Hypertension 02/12/2015  . Hypothyroidism 02/12/2015  . Anemia, iron deficiency 02/12/2015  . Low testosterone 02/12/2015  . Neuropathy 02/12/2015  . Obesity 02/12/2015  . Peripheral neuropathy 02/12/2015  . Obstructive sleep apnea 02/12/2015  . Snores 02/12/2015  . Gout 05/05/2012  . Gastric ulcer 11/21/2009      Prior to Admission medications   Medication Sig Start Date End Date Taking? Authorizing Provider  albuterol (PROAIR HFA) 108 (90 Base) MCG/ACT inhaler USE 2 INHALATIONS EVERY 4 HOURS AS NEEDED FOR WHEEZING OR SHORTNESS OF BREATH 11/14/17  Yes [name], MD  amitriptyline (ELAVIL) 10 MG tablet One tablet at bedtime for 1 week for diabetic nerve pain, then increase to 2 tablets at bedtime 11/18/17  Yes [name], MD  amLODipine (NORVASC) 10 MG tablet Take 1 tablet (10 mg total) by mouth daily. 03/01/17  Yes [name], MD  Ascorbic Acid (VITAMIN C) 1000 MG tablet Take 1,000 mg by mouth 2 (two) times daily.    Yes [provider]  aspirin 81 MG tablet Take 81 mg by mouth daily.   Yes [provider]  atorvastatin (LIPITOR) 40 MG tablet Take 1 tablet (40 mg total)  by mouth daily. 10/27/17  Yes Chrismon, Vickki Muff, PA  Calcium Carbonate (CALCIUM 600 PO) Take 1 tablet by mouth daily.   Yes [provider]  cetirizine (ZYRTEC) 10 MG tablet Take 10 mg by mouth daily.    Yes [provider]  cholecalciferol (VITAMIN D) 1000 units tablet Take 1,000 mcg by mouth daily.    Yes [provider]  Cyanocobalamin (B-12 PO) Take 1,000 mcg by mouth daily.    Yes [provider]  glipiZIDE (GLIPIZIDE XL) 2.5 MG 24 hr tablet Take 1 tablet (2.5 mg total) by mouth daily with breakfast. 03/01/17  Yes Fisher, Kirstie Peri, MD  insulin regular human CONCENTRATED (HUMULIN R) 500  UNIT/ML injection DRAW TO THE 25 UNIT MARK IN A U100 SYRINGE AND INJECT IN THE MORNING AND 25 UNIT MARK IN THE EVENING Patient taking differently: Inject 30u under the skin every morning and 40u under the skin every evening 10/12/17  Yes Fisher, Kirstie Peri, MD  levothyroxine (SYNTHROID, LEVOTHROID) 100 MCG tablet TAKE ONE TABLET BY MOUTH ONCE DAILY 05/02/17  Yes [name], MD  lisinopril (PRINIVIL,ZESTRIL) 20 MG tablet Take 20 mg by mouth 2 (two) times daily.  05/16/17  Yes [provider]  metoprolol tartrate (LOPRESSOR) 50 MG tablet TAKE 1 TABLET TWICE A DAY 09/16/16  Yes [name], MD  pantoprazole (PROTONIX) 40 MG tablet Take 1 tablet (40 mg total) by mouth 2 (two) times daily. 11/18/17  Yes [name], MD  testosterone cypionate (DEPO-TESTOSTERONE) 200 MG/ML injection Inject 1 mL (200 mg total) into the muscle every 14 (fourteen) days. 05/30/17  Yes [name], MD  Tiotropium Bromide-Olodaterol (STIOLTO RESPIMAT) 2.5-2.5 MCG/ACT AERS Inhale 2 puffs into the lungs daily. 11/18/17  Yes [name], MD  torsemide (DEMADEX) 20 MG tablet Take 40 mg by mouth daily.   Yes [provider]  ULORIC 80 MG TABS Take 80 mg by mouth daily.  05/18/17  Yes [provider]  hydrochlorothiazide (HYDRODIURIL) 50 MG tablet TAKE 1 TABLET DAILY Patient not taking: Reported on 11/19/2017 03/07/17   [name], MD  metFORMIN (GLUCOPHAGE) 1000 MG tablet Take 1 tablet (1,000 mg total) by mouth 2 (two) times daily. Patient not taking: Reported on 11/18/2017 12/07/16   [name], MD  triamcinolone lotion (KENALOG) 0.1 % Apply 1 application topically 3 (three) times daily. Patient not taking: Reported on 11/19/2017 06/23/17   [name], MD    Allergies Mucinex  [guaifenesin er] and Levaquin [levofloxacin]    Social History Social History   Tobacco Use  . Smoking status: Former Smoker    Packs/day: 1.00    Years: 15.00    Pack years: 15.00    Types:  Cigarettes    Last attempt to quit: 05/03/1978    Years since quitting: 39.5  . Smokeless tobacco: Former Systems developer  . Tobacco comment: started smoking at age 57  Substance Use Topics  . Alcohol use: No    Alcohol/week: 0.0 oz  . Drug use: No    Review of Systems Patient denies headaches, rhinorrhea, blurry vision, numbness, shortness of breath, chest pain, edema, cough, abdominal pain, nausea, vomiting, diarrhea, dysuria, fevers, rashes or hallucinations unless otherwise stated above in HPI. ____________________________________________   PHYSICAL EXAM:  VITAL SIGNS: Vitals:   11/19/17 1158  BP: (!) 154/59  Pulse: 70  Resp: 18  Temp: 98.7 F (37.1 C)  SpO2: 96%    Constitutional: Alert and oriented.  Eyes: Conjunctivae  are normal.  Head: Atraumatic. Nose: No congestion/rhinnorhea. Mouth/Throat: Mucous membranes are moist.   Neck: No stridor. Painless ROM.  Cardiovascular: Normal rate, regular rhythm. Grossly normal heart sounds.  Good peripheral circulation. Respiratory: Normal respiratory effort.  No retractions. Lungs CTAB. Gastrointestinal: Soft and nontender. No distention. No abdominal bruits. No CVA tenderness. Genitourinary:  Musculoskeletal: No lower extremity tenderness nor edema.  No joint effusions. Neurologic:  Normal speech and language. No gross focal neurologic deficits are appreciated. No facial droop Skin:  Skin is warm, dry and intact. No rash noted. Psychiatric: Mood and affect are normal. Speech and behavior are normal.  ____________________________________________   LABS (all labs ordered are listed, but only abnormal results are displayed)  Results for orders placed or performed during the hospital encounter of 11/19/17 (from the past 24 hour(s))  Basic metabolic panel     Status: Abnormal   Collection Time: 11/19/17 12:00 PM  Result Value Ref Range   Sodium 138 135 - 145 mmol/L   Potassium 5.8 (H) 3.5 - 5.1 mmol/L   Chloride 111 98 - 111 mmol/L     CO2 18 (L) 22 - 32 mmol/L   Glucose, Bld 204 (H) 70 - 99 mg/dL   BUN 114 (H) 6 - 20 mg/dL   Creatinine, Ser 3.83 (H) 0.61 - 1.24 mg/dL   Calcium 7.9 (L) 8.9 - 10.3 mg/dL   GFR calc non Af Amer 16 (L) >60 mL/min   GFR calc Af Amer 18 (L) >60 mL/min   Anion gap 9 5 - 15  CBC with Differential/Platelet     Status: Abnormal   Collection Time: 11/19/17 12:00 PM  Result Value Ref Range   WBC 4.7 3.8 - 10.6 K/uL   RBC 3.17 (L) 4.40 - 5.90 MIL/uL   Hemoglobin 9.9 (L) 13.0 - 18.0 g/dL   HCT 28.2 (L) 40.0 - 52.0 %   MCV 89.2 80.0 - 100.0 fL   MCH 31.3 26.0 - 34.0 pg   MCHC 35.1 32.0 - 36.0 g/dL   RDW 16.1 (H) 11.5 - 14.5 %   Platelets 80 (L) 150 - 440 K/uL   Neutrophils Relative % 66 %   Neutro Abs 3.1 1.4 - 6.5 K/uL   Lymphocytes Relative 20 %   Lymphs Abs 0.9 (L) 1.0 - 3.6 K/uL   Monocytes Relative 9 %   Monocytes Absolute 0.4 0.2 - 1.0 K/uL   Eosinophils Relative 4 %   Eosinophils Absolute 0.2 0 - 0.7 K/uL   Basophils Relative 1 %   Basophils Absolute 0.1 0 - 0.1 K/uL   ____________________________________________  EKG My review and personal interpretation at Time: 12:11   Indication: hyperkalemia  Rate: 65  Rhythm: sinus Axis: normal Other: peaked T waves, no stemi ____________________________________________  RADIOLOGY   ____________________________________________   PROCEDURES  Procedure(s) performed:  .Critical Care Performed by: Merlyn Lot, MD Authorized by: Merlyn Lot, MD   Critical care provider statement:    Critical care time (minutes):  35   Critical care time was exclusive of:  Separately billable procedures and treating other patients   Critical care was necessary to treat or prevent imminent or life-threatening deterioration of the following conditions:  Renal failure   Critical care was time spent personally by me on the following activities:  Development of treatment plan with patient or surrogate, discussions with consultants,  evaluation of patient's response to treatment, examination of patient, obtaining history from patient or surrogate, ordering and performing treatments and interventions, ordering and review  of laboratory studies, ordering and review of radiographic studies, pulse oximetry, re-evaluation of patient's condition and review of old [place] performed: yes ____________________________________________   INITIAL IMPRESSION / ASSESSMENT AND PLAN / ED COURSE  Pertinent labs & imaging results that were available during my care of the patient were reviewed by me and considered in my medical decision making (see chart for details).   DDX: aki, renal failure, hyperkalemia  Patrick Hurst is a 62 y.o. who presents to the ED with symptoms as described above.  Patient does describe fatigue.  Potassium was 7 yesterday and with his EKG changes showing hyperacute T waves will temporize with IV calcium as well as IV insulin sodium bicarb and albuterol.  Will give [place].  Clinical Course as of Nov 19 1329  Sat Nov 19, 2017  1318 Potassium is 5.8.  Patient has received calcium for his EKG changes and will be temporized with additional medications for his renal failure.  Do suspect that much of his worsening fatigue and symptomatology secondary to elevated BUN at 114 and worsening renal function.  Patient given IV fluids.  He is still making urine.  Will order ultrasound.  Patient will require hospitalization for further medical management.  Have discussed with the patient and available family all diagnostics and treatments performed thus far and all questions were answered to the best of my ability. The patient demonstrates understanding and agreement with plan.    [PR]    Clinical Course User Index [PR] Merlyn Lot, MD     As part of my medical decision making, I reviewed the following data within the [place] notes reviewed and incorporated, Labs reviewed,  notes from prior ED visits and [place] Controlled Substance Database   ____________________________________________   FINAL CLINICAL IMPRESSION(S) / ED DIAGNOSES  Final diagnoses:  Hyperkalemia  Uremia      NEW MEDICATIONS STARTED DURING THIS VISIT:  New Prescriptions   No medications on file     Note:  This document was prepared using Dragon voice recognition software and may include unintentional dictation errors.    Merlyn Lot, MD 11/19/17 1331

## 2017-11-19 NOTE — ED Notes (Signed)
Unable to call report due to RN not available at this time.

## 2017-11-20 LAB — PSA: PROSTATE SPECIFIC AG, SERUM: 0.6 ng/mL (ref 0.0–4.0)

## 2017-11-20 LAB — COMPREHENSIVE METABOLIC PANEL
A/G RATIO: 1.4 (ref 1.2–2.2)
ALT: 34 IU/L (ref 0–44)
AST: 25 IU/L (ref 0–40)
Albumin: 4.3 g/dL (ref 3.6–4.8)
Alkaline Phosphatase: 82 IU/L (ref 39–117)
BUN/Creatinine Ratio: 25 — ABNORMAL HIGH (ref 10–24)
BUN: 100 mg/dL (ref 8–27)
Bilirubin Total: 0.4 mg/dL (ref 0.0–1.2)
CALCIUM: 8.1 mg/dL — AB (ref 8.6–10.2)
CHLORIDE: 104 mmol/L (ref 96–106)
CO2: 16 mmol/L — ABNORMAL LOW (ref 20–29)
Creatinine, Ser: 4.06 mg/dL — ABNORMAL HIGH (ref 0.76–1.27)
GFR, EST AFRICAN AMERICAN: 17 mL/min/{1.73_m2} — AB (ref >59)
GFR, EST NON AFRICAN AMERICAN: 15 mL/min/{1.73_m2} — AB (ref >59)
GLOBULIN, TOTAL: 3 g/dL (ref 1.5–4.5)
Glucose: 160 mg/dL — ABNORMAL HIGH (ref 65–99)
POTASSIUM: 7 mmol/L — AB (ref 3.5–5.2)
SODIUM: 135 mmol/L (ref 134–144)
TOTAL PROTEIN: 7.3 g/dL (ref 6.0–8.5)

## 2017-11-20 LAB — GLUCOSE, CAPILLARY
GLUCOSE-CAPILLARY: 128 mg/dL — AB (ref 70–99)
GLUCOSE-CAPILLARY: 158 mg/dL — AB (ref 70–99)
Glucose-Capillary: 202 mg/dL — ABNORMAL HIGH (ref 70–99)
Glucose-Capillary: 203 mg/dL — ABNORMAL HIGH (ref 70–99)

## 2017-11-20 LAB — BASIC METABOLIC PANEL
ANION GAP: 5 (ref 5–15)
BUN: 98 mg/dL — ABNORMAL HIGH (ref 6–20)
CALCIUM: 8.2 mg/dL — AB (ref 8.9–10.3)
CO2: 19 mmol/L — ABNORMAL LOW (ref 22–32)
Chloride: 116 mmol/L — ABNORMAL HIGH (ref 98–111)
Creatinine, Ser: 3.17 mg/dL — ABNORMAL HIGH (ref 0.61–1.24)
GFR calc Af Amer: 23 mL/min — ABNORMAL LOW (ref >60)
GFR calc non Af Amer: 20 mL/min — ABNORMAL LOW (ref >60)
GLUCOSE: 132 mg/dL — AB (ref 70–99)
POTASSIUM: 6.3 mmol/L — AB (ref 3.5–5.1)
Sodium: 140 mmol/L (ref 135–145)

## 2017-11-20 LAB — LIPID PANEL
CHOLESTEROL TOTAL: 125 mg/dL (ref 100–199)
Chol/HDL Ratio: 6.3 ratio — ABNORMAL HIGH (ref 0.0–5.0)
HDL: 20 mg/dL — AB (ref >39)
TRIGLYCERIDES: 449 mg/dL — AB (ref 0–149)

## 2017-11-20 LAB — TSH: TSH: 3.88 u[IU]/mL (ref 0.450–4.500)

## 2017-11-20 LAB — BRAIN NATRIURETIC PEPTIDE: BNP: 21.9 pg/mL (ref 0.0–100.0)

## 2017-11-20 LAB — HEMOGLOBIN A1C
Est. average glucose Bld gHb Est-mCnc: 100 mg/dL
Hgb A1c MFr Bld: 5.1 % (ref 4.8–5.6)

## 2017-11-20 LAB — C DIFFICILE QUICK SCREEN W PCR REFLEX
C DIFFICILE (CDIFF) INTERP: NOT DETECTED
C DIFFICLE (CDIFF) ANTIGEN: NEGATIVE
C Diff toxin: NEGATIVE

## 2017-11-20 LAB — TESTOSTERONE,FREE AND TOTAL
TESTOSTERONE: 537 ng/dL (ref 264–916)
Testosterone, Free: 26.2 pg/mL — ABNORMAL HIGH (ref 6.6–18.1)

## 2017-11-20 LAB — POTASSIUM
Potassium: 6.5 mmol/L (ref 3.5–5.1)
Potassium: 6 mmol/L — ABNORMAL HIGH (ref 3.5–5.1)

## 2017-11-20 LAB — PHOSPHORUS: Phosphorus: 4.3 mg/dL (ref 2.5–4.6)

## 2017-11-20 MED ORDER — DEXTROSE 50 % IV SOLN: 25.0000 mL | Freq: Once | INTRAVENOUS | Status: DC

## 2017-11-20 MED ORDER — ALBUTEROL SULFATE (2.5 MG/3ML) 0.083% IN NEBU: 2.5000 mg | INHALATION_SOLUTION | RESPIRATORY_TRACT | Status: AC

## 2017-11-20 MED ORDER — SODIUM BICARBONATE 8.4 % IV SOLN
INTRAVENOUS | Status: DC
  Administered 2017-11-20 – 2017-11-21 (×3): via INTRAVENOUS
  Filled 2017-11-20 (×5): qty 150

## 2017-11-20 MED ORDER — SODIUM CHLORIDE 0.9 % IV SOLN
1.0000 g | Freq: Once | INTRAVENOUS | Status: AC
  Administered 2017-11-20: 1 g via INTRAVENOUS
  Filled 2017-11-20: qty 10

## 2017-11-20 MED ORDER — INSULIN ASPART 100 UNIT/ML IV SOLN
10.0000 [IU] | Freq: Once | INTRAVENOUS | Status: DC
  Filled 2017-11-20: qty 0.1

## 2017-11-20 NOTE — Progress Notes (Signed)
Initial Nutrition Assessment  DOCUMENTATION CODES:   Morbid obesity  INTERVENTION:  Reviewed "High Potassium Food List" from the Academy of Nutrition and Dietetics with patient. Encouraged patient to limit/avoid foods high in potassium. Reviewed "Lower Potassium Food List" from the Academy of Nutrition Dietetics. Encouraged patient to replace high-potassium foods with lower-potassium foods from this list.   Will continue to monitor appetite and intake at meals.  NUTRITION DIAGNOSIS:   Food and nutrition related knowledge deficit related to limited prior education as evidenced by per patient/family report.  GOAL:   Patient will meet greater than or equal to 90% of their needs  MONITOR:   PO intake, Labs, Weight trends, I & O's  REASON FOR ASSESSMENT:   Malnutrition Screening Tool    ASSESSMENT:   62 year old male with PMHx of DM type 2 insulin dependent, diabetic retinopathy, diabetic neuropathy, gout, hypothyroidism, GERD, vitamin D deficiency, HLD, depression, OSA, Bell's palsy, HTN, arthritis, non-alcoholic fatty liver disease/cirrhosis who is admitted with hyperkalemia with EKG changes, acute renal failure on CKD stage IV.   Met with patient at bedside. He reports he has had a decreased appetite for the past 2 months. He believes it is related to his "kidneys" and his GERD. He has still been eating the same 2-3 meals per day he typically eats, but has been eating smaller portions at meals. He typically has grilled chicken or [place] and then has some fruits and vegetables at meals. He reports his appetite is picking back up now. Noted he is eating 100% of meals now. Patient reports he is waiting to hear if he will need to start dialysis. He has already been through classes at nephrology office and has started learning about diet with kidney disease.   UBW 310 lbs, but he reports his weight can fluctuate with fluid. He was 303.4 lbs on 05/28/2017. Patient reports he is 299 lbs now,  which is not a significant change. Unsure if that weight is accurate though as it appears stated.  Meal Completion: 100%  Medications reviewed and include: calcium carbonate 600 mg daily with breakfast, vitamin D 1000 units daily, Novolog 0-9 units TID, Novolog 30 units BID with meals, levothyroxine, pantoprazole, senna, vitamin B12 1000 micrograms daily, vitamin C 1000 mg BID, sodium bicarbonate 150 mEq in D5W at 100 mL/hr.  Labs reviewed: CBG 84-158, Potassium 6.5, CO2 19, BUN 98, Creatinine 3.17, eGFR 20.  Patient does not meet criteria for malnutrition.  NUTRITION - FOCUSED PHYSICAL EXAM:    Most Recent Value  Orbital Region  No depletion  Upper Arm Region  No depletion  Thoracic and Lumbar Region  No depletion  Buccal Region  No depletion  Temple Region  No depletion  Clavicle Bone Region  No depletion  Clavicle and Acromion Bone Region  No depletion  Scapular Bone Region  No depletion  Dorsal Hand  No depletion  Patellar Region  No depletion  Anterior Thigh Region  No depletion  Posterior Calf Region  No depletion  Edema (RD Assessment)  Mild  Hair  Reviewed  Eyes  Reviewed  Mouth  Reviewed  Skin  Reviewed  Nails  Reviewed     Diet Order:   Diet Order           Diet renal/carb modified with fluid restriction Diet-HS Snack? Nothing; Room service appropriate? Yes; Fluid consistency: Thin  Diet effective now          EDUCATION NEEDS:   Education needs have been addressed  Skin:  Skin Assessment: Reviewed RN Assessment  Last BM:  11/19/2017  Height:   Ht Readings from Last 1 Encounters:  11/19/17 '5\' 11"'  (1.803 m)    Weight:   Wt Readings from Last 1 Encounters:  11/19/17 299 lb (135.6 kg)    Ideal Body Weight:  78.2 kg  BMI:  Body mass index is 41.7 kg/m.  Estimated Nutritional Needs:   Kcal:  2400-2600  Protein:  100-115 grams  Fluid:  per MD  Willey Blade, MS, RD, LDN Office: [phone] Pager: [phone] After Hours/Weekend Pager:  [phone]

## 2017-11-20 NOTE — Progress Notes (Signed)
[facility] [place] NAME: Patrick Hurst    MR#:  [id number]  DATE OF BIRTH:  1955/08/16  SUBJECTIVE:  CHIEF COMPLAINT:   Chief Complaint  Patient presents with  . Other   Has chronic kidney disease, advised by PMD to ER as noted to have hyperkalemia on routine blood work. No new complaints.  REVIEW OF SYSTEMS:  CONSTITUTIONAL: No fever, fatigue or weakness.  EYES: No blurred or double vision.  EARS, NOSE, AND THROAT: No tinnitus or ear pain.  RESPIRATORY: No cough, shortness of breath, wheezing or hemoptysis.  CARDIOVASCULAR: No chest pain, orthopnea, edema.  GASTROINTESTINAL: No nausea, vomiting, diarrhea or abdominal pain.  GENITOURINARY: No dysuria, hematuria.  ENDOCRINE: No polyuria, nocturia,  HEMATOLOGY: No anemia, easy bruising or bleeding SKIN: No rash or lesion. MUSCULOSKELETAL: No joint pain or arthritis.   NEUROLOGIC: No tingling, numbness, weakness.  PSYCHIATRY: No anxiety or depression.   ROS  DRUG ALLERGIES:   Allergies  Allergen Reactions  . Mucinex  [Guaifenesin Er]     Throat swelling, increases heart rate  . Levaquin [Levofloxacin] Palpitations    VITALS:  Blood pressure (!) 142/53, pulse 62, temperature 98.2 F (36.8 C), temperature source Oral, resp. rate 20, height 5\' 11"  (1.803 m), weight 135.6 kg (299 lb), SpO2 96 %.  PHYSICAL EXAMINATION:  GENERAL:  62 y.o.-year-old patient lying in the bed with no acute distress.  EYES: Pupils equal, round, reactive to light and accommodation. No scleral icterus. Extraocular muscles intact.  HEENT: Head atraumatic, normocephalic. Oropharynx and nasopharynx clear.  NECK:  Supple, no jugular venous distention. No thyroid enlargement, no tenderness.  LUNGS: Normal breath sounds bilaterally, no wheezing, rales,rhonchi or crepitation. No use of accessory muscles of respiration.  CARDIOVASCULAR: S1, S2 normal. No murmurs, rubs, or gallops.  ABDOMEN: Soft, nontender,  nondistended. Bowel sounds present. No organomegaly or mass.  EXTREMITIES: No pedal edema, cyanosis, or clubbing.  NEUROLOGIC: Cranial nerves II through XII are intact. Muscle strength 5/5 in all extremities. Sensation intact. Gait not checked.  PSYCHIATRIC: The patient is alert and oriented x 3.  SKIN: No obvious rash, lesion, or ulcer.   Physical Exam LABORATORY PANEL:   CBC Recent Labs  Lab 11/19/17 1200  WBC 4.7  HGB 9.9*  HCT 28.2*  PLT 80*   ------------------------------------------------------------------------------------------------------------------  Chemistries  Recent Labs  Lab 11/18/17 1012  11/20/17 0720 11/20/17 1253  NA 135   < > 140  --   K 7.0*   < > 6.3* 6.5*  CL 104   < > 116*  --   CO2 16*   < > 19*  --   GLUCOSE 160*   < > 132*  --   BUN 100*   < > 98*  --   CREATININE 4.06*   < > 3.17*  --   CALCIUM 8.1*   < > 8.2*  --   AST 25  --   --   --   ALT 34  --   --   --   ALKPHOS 82  --   --   --   BILITOT 0.4  --   --   --    < > = values in this interval not displayed.   ------------------------------------------------------------------------------------------------------------------  Cardiac Enzymes No results for input(s): TROPONINI in the last 168 hours. ------------------------------------------------------------------------------------------------------------------  RADIOLOGY:  US Renal  Result Date: 11/19/2017 CLINICAL DATA:  Renal failure, hyperkalemia EXAM: RENAL / URINARY TRACT ULTRASOUND COMPLETE COMPARISON:  09/24/2015 FINDINGS: Right Kidney: Length: 12.4 cm. Echogenicity within normal limits. No mass or hydronephrosis visualized. Left Kidney: Length: 12.1 cm. Normal echogenicity. Small anechoic cortical cyst in the lower pole measures 14 mm. No hydronephrosis. Bladder: Appears normal for degree of bladder distention. IMPRESSION: No acute finding by renal ultrasound. Negative for hydronephrosis. 14 mm left kidney lower pole renal cyst  Electronically Signed   By: Jerilynn Mages.  Shick M.D.   On: 11/19/2017 15:04    ASSESSMENT AND PLAN:   Active Problems:   Hyperkalemia   Patrick Hurst  is a 62 y.o. male with a known history of diabetes on insulin, morbid obesity on CPAP, hypertension, depression, CKD stage IV comes to the emergency room after he was found to have potassium of 7.0 with elevated BUN and creatinine on routine labs done as part of annual physical.  1. acute hyperkalemia with EKG changes of  peakedT waves  -patient received in the ER dextrose with insulin, calcium gluconate, bicarb, valtessa -K 7.0--Rx--5.8- again 6.3 -repeat potassium around 6 o'clock -IV fluids -nephrology consultation appreciated,a s pt have chronic High K, suggest " no need for Insuline+ dextrose inj , or calcium gluconate inj now. -tele monitoring cont  2. diabetes type II -insulin and sliding scale  3. Acute on chronic kidney disease stage IV -baseline creatinine 2.5 -4.06--- 3.38- 3.1 - bicarb drip.  4. Morbid obesity with obstructive sleep apnea on CPAP  5. hypertension continue home meds  6. DVT prophylaxis subcu heparin   All the records are reviewed and case discussed with Care Management/Social Workerr. Management plans discussed with the patient, family and they are in agreement.  CODE STATUS: Full.  TOTAL TIME TAKING CARE OF THIS PATIENT: 35 minutes.    POSSIBLE D/C IN 1-2 DAYS, DEPENDING ON CLINICAL CONDITION.   Vaughan Basta M.D on 11/20/2017   Between 7am to 6pm - Pager - [phone]  After 6pm go to [email or web address] - password [email or web address] [facility]  Office  [phone]  CC: Primary care physician; [name], MD  Note: This dictation was prepared with Dragon dictation along with smaller phrase technology. Any transcriptional errors that result from this process are unintentional.

## 2017-11-20 NOTE — Progress Notes (Signed)
Text paged MD [place] about critical potassium level a second time. Awaiting call back.

## 2017-11-20 NOTE — Progress Notes (Signed)
Critical lab value of K 6.3. MD paged. Will notify as soon as he calls back. Will continue to monitor.

## 2017-11-20 NOTE — Progress Notes (Signed)
[facility]  ROUNDING NOTE   Subjective:   Laying in bed.  Creatinine 3.17 (3.6) K 6.3  Complains of peripheral neuropathy  Objective:  Vital signs in last 24 hours:  Temp:  [98.2 F (36.8 C)-98.7 F (37.1 C)] 98.3 F (36.8 C) (07/21 0821) Pulse Rate:  [58-70] 64 (07/21 0821) Resp:  [16-20] 18 (07/21 0821) BP: (125-154)/(59-85) 141/70 (07/21 0821) SpO2:  [94 %-98 %] 95 % (07/21 0821) Weight:  [135.6 kg (299 lb)] 135.6 kg (299 lb) (07/20 1158)  Weight change:  Filed Weights   11/19/17 1158  Weight: 135.6 kg (299 lb)    Intake/Output: I/O last 3 completed shifts: In: 1628.3 [I.V.:1128.3; IV Piggyback:500] Out: 1800 [JJHER:[id number]]   Intake/Output this shift:  No intake/output data recorded.  Physical Exam: General: NAD, laying in bed  Head: Normocephalic, atraumatic. Moist oral mucosal membranes  Eyes: Anicteric, PERRL  Neck: Supple, trachea midline  Lungs:  Clear to auscultation  Heart: Regular rate and rhythm  Abdomen:  Soft, nontender,   Extremities: no peripheral edema.  Neurologic: Nonfocal, moving all four extremities  Skin: No lesions  Access: none    Basic Metabolic Panel: Recent Labs  Lab 11/18/17 1012 11/19/17 1200 11/19/17 1752 11/20/17 0720  NA 135 138 141 140  K 7.0* 5.8* 5.6* 6.3*  CL 104 111 113* 116*  CO2 16* 18* 18* 19*  GLUCOSE 160* 204* 97 132*  BUN 100* 114* 111* 98*  CREATININE 4.06* 3.83* 3.60* 3.17*  CALCIUM 8.1* 7.9* 8.3* 8.2*    Liver Function Tests: Recent Labs  Lab 11/18/17 1012  AST 25  ALT 34  ALKPHOS 82  BILITOT 0.4  PROT 7.3  ALBUMIN 4.3   No results for input(s): LIPASE, AMYLASE in the last 168 hours. No results for input(s): AMMONIA in the last 168 hours.  CBC: Recent Labs  Lab 11/19/17 1200  WBC 4.7  NEUTROABS 3.1  HGB 9.9*  HCT 28.2*  MCV 89.2  PLT 80*    Cardiac Enzymes: No results for input(s): CKTOTAL, CKMB, CKMBINDEX, TROPONINI in the last 168 hours.  BNP: Invalid  input(s): POCBNP  CBG: Recent Labs  Lab 11/19/17 1648 11/19/17 1744 11/19/17 2052 11/20/17 0743  GLUCAP 18* 84 111* 128*    Microbiology: Results for orders placed or performed during the hospital encounter of 11/19/17  C difficile quick scan w PCR reflex     Status: None   Collection Time: 11/20/17  3:03 AM  Result Value Ref Range Status   C Diff antigen NEGATIVE NEGATIVE Final   C Diff toxin NEGATIVE NEGATIVE Final   C Diff interpretation No C. difficile detected.  Final    Comment: Performed at [facility], [address] [place], [place] [address]    Coagulation Studies: No results for input(s): LABPROT, INR in the last 72 hours.  Urinalysis: Recent Labs    11/19/17 1437  COLORURINE STRAW*  LABSPEC 1.008  PHURINE 5.0  GLUCOSEU NEGATIVE  HGBUR NEGATIVE  BILIRUBINUR NEGATIVE  KETONESUR NEGATIVE  PROTEINUR 30*  NITRITE NEGATIVE  LEUKOCYTESUR NEGATIVE      Imaging: US Renal  Result Date: 11/19/2017 CLINICAL DATA:  Renal failure, hyperkalemia EXAM: RENAL / URINARY TRACT ULTRASOUND COMPLETE COMPARISON:  09/24/2015 FINDINGS: Right Kidney: Length: 12.4 cm. Echogenicity within normal limits. No mass or hydronephrosis visualized. Left Kidney: Length: 12.1 cm. Normal echogenicity. Small anechoic cortical cyst in the lower pole measures 14 mm. No hydronephrosis. Bladder: Appears normal for degree of bladder distention. IMPRESSION: No acute finding  by renal ultrasound. Negative for hydronephrosis. 14 mm left kidney lower pole renal cyst Electronically Signed   By: Jerilynn Mages.  Shick M.D.   On: 11/19/2017 15:04     Medications:   . calcium gluconate 1 g (11/20/17 1025)  .  sodium bicarbonate  infusion 1000 mL 100 mL/hr at 11/20/17 1025   . amLODipine  10 mg Oral Daily  . aspirin EC  81 mg Oral Daily  . atorvastatin  40 mg Oral q1800  . calcium carbonate  600 mg of elemental calcium Oral Q breakfast  . cholecalciferol  1,000 Units Oral Daily  . dextrose  25 mL  Intravenous Once  . heparin  5,000 Units Subcutaneous Q8H  . insulin aspart  0-9 Units Subcutaneous TID WC  . insulin aspart  10 Units Intravenous Once  . insulin aspart  30 Units Subcutaneous BID WC  . levothyroxine  100 mcg Oral QAC breakfast  . metoprolol tartrate  50 mg Oral BID  . pantoprazole  40 mg Oral BID  . patiromer  8.4 g Oral Daily  . pneumococcal 23 valent vaccine  0.5 mL Intramuscular Tomorrow-1000  . senna  1 tablet Oral BID  . vitamin B-12  1,000 mcg Oral Daily  . vitamin C  1,000 mg Oral BID   acetaminophen **OR** acetaminophen, albuterol, amitriptyline  Assessment/ Plan:  [name] is a 62 y.o. white male with diabetes mellitus type II insulin dependent, diabetic retinopathy, diabetic neuropathy, hyperlipidemia, obstructive sleep apnea, obesity, hypertension, hypothyroidism, non-alcoholic fatty liver disease/cirrhosis admitted to [facility] on Hyperkalemia [E87.5] Uremia [N19]  1.  Acute renal failure with uremia, hyperkalemia and metabolic acidosis on chronic kidney disease stage IV with history of nephrotic range proteinuria.  Chronic kidney disease secondary to diabetic nephropathy. No history of biopsy. Differential included membranous glomerulonephritis or FSGS.  Acute renal failure secondary to progression of disease versus prerenal azotemia - Hold ACE-I: lisinopril. Hold diuretic: torsemide - IV fluids: bicarb gtt - Patiromer for hyperkalemia - Discussed dialysis with patient.  - Discussed low potassium diet - Pending SPEP/UPEP, spot urine protein to creatinine ratio, viral hepatitis panel  2. Hypertension: with edema. Well controlled on examination. No edema on examination  3. Diabetes mellitus type II with chronic kidney disease: insulin dependent. Hemoglobin A1c of 5.1%. History of well controlled diabetes.  Complains of peripheral neuropathy, requesting amitriptyline.   4. Secondary Hyperparathyroidism with hypocalcemia: PTH 179 on 09/16/17 -  Pending PTH and phosphorus   5. Anemia of chronic kidney disease: hemoglobin 9.9. Normocytic.  - Discussed ESA therapy with patient.     LOS: 1 Patrick Hurst 7/21/201910:27 AM

## 2017-11-20 NOTE — Progress Notes (Signed)
Family Meeting Note  Advance Directive:no  Today a meeting took place with the Patient.  The following clinical team members were present during this meeting:MD  The following were discussed:Patient's diagnosis: CKD, Hyperkalemia , Patient's progosis: Unable to determine and Goals for treatment: Full Code  Additional follow-up to be provided: Nephrology  Time spent during discussion:20 minutes  Vaughan Basta, MD

## 2017-11-20 NOTE — Progress Notes (Signed)
Critical Lab value: Potassium 6.5  MD made aware. No new orders given. Will continue to monitor.

## 2017-11-21 LAB — RENAL FUNCTION PANEL
ALBUMIN: 4 g/dL (ref 3.5–5.0)
ANION GAP: 6 (ref 5–15)
BUN: 92 mg/dL — AB (ref 6–20)
CALCIUM: 8.5 mg/dL — AB (ref 8.9–10.3)
CO2: 24 mmol/L (ref 22–32)
Chloride: 111 mmol/L (ref 98–111)
Creatinine, Ser: 2.93 mg/dL — ABNORMAL HIGH (ref 0.61–1.24)
GFR calc Af Amer: 25 mL/min — ABNORMAL LOW (ref >60)
GFR, EST NON AFRICAN AMERICAN: 22 mL/min — AB (ref >60)
GLUCOSE: 158 mg/dL — AB (ref 70–99)
Phosphorus: 4.6 mg/dL (ref 2.5–4.6)
Potassium: 5.9 mmol/L — ABNORMAL HIGH (ref 3.5–5.1)
SODIUM: 141 mmol/L (ref 135–145)

## 2017-11-21 LAB — GLUCOSE, CAPILLARY
Glucose-Capillary: 174 mg/dL — ABNORMAL HIGH (ref 70–99)
Glucose-Capillary: 187 mg/dL — ABNORMAL HIGH (ref 70–99)

## 2017-11-21 MED ORDER — INSULIN ASPART 100 UNIT/ML ~~LOC~~ SOLN: 0.0000 [IU] | Freq: Every day | SUBCUTANEOUS | Status: DC

## 2017-11-21 MED ORDER — INSULIN ASPART 100 UNIT/ML ~~LOC~~ SOLN
0.0000 [IU] | Freq: Three times a day (TID) | SUBCUTANEOUS | Status: DC
  Administered 2017-11-21: 2 [IU] via SUBCUTANEOUS
  Filled 2017-11-21: qty 1

## 2017-11-21 MED ORDER — INSULIN ASPART 100 UNIT/ML ~~LOC~~ SOLN
3.0000 [IU] | Freq: Three times a day (TID) | SUBCUTANEOUS | Status: DC
  Administered 2017-11-21: 12:00:00 3 [IU] via SUBCUTANEOUS
  Filled 2017-11-21: qty 1

## 2017-11-21 MED ORDER — AMITRIPTYLINE HCL 10 MG PO TABS
10.0000 mg | ORAL_TABLET | Freq: Every day | ORAL | Status: DC
  Filled 2017-11-21: qty 1

## 2017-11-21 MED ORDER — CALCIUM CARBONATE ANTACID 500 MG PO CHEW
3.0000 | CHEWABLE_TABLET | Freq: Every day | ORAL | Status: DC
  Administered 2017-11-21: 10:00:00 600 mg via ORAL
  Filled 2017-11-21: qty 3

## 2017-11-21 MED ORDER — PATIROMER SORBITEX CALCIUM 8.4 G PO PACK: 8.4000 g | PACK | Freq: Every day | ORAL | 0 refills | Status: AC

## 2017-11-21 MED ORDER — INSULIN REGULAR HUMAN (CONC) 500 UNIT/ML ~~LOC~~ SOLN: SUBCUTANEOUS | 5 refills | Status: DC

## 2017-11-21 NOTE — Discharge Summary (Signed)
[facility] [place] NAME: Patrick Hurst    MR#:  [id number]  DATE OF BIRTH:  November 16, 1955  DATE OF ADMISSION:  11/19/2017 ADMITTING PHYSICIAN: Patrick Mandes, MD  DATE OF DISCHARGE: 11/21/2017   PRIMARY CARE PHYSICIAN: Patrick Sons, MD    ADMISSION DIAGNOSIS:  Hyperkalemia [E87.5] Uremia [N19]  DISCHARGE DIAGNOSIS:  Active Problems:   Hyperkalemia   Ac on CH renal failure  SECONDARY DIAGNOSIS:   Past Medical History:  Diagnosis Date  . Allergy   . Anemia   . Arthritis   . Bell's palsy   . Depression   . Diabetes ([place])   . Gastric ulcer   . GERD (gastroesophageal reflux disease)   . Gout   . History of chicken pox   . Hyperlipidemia   . Hypertension   . Hypothyroidism   . OSA (obstructive sleep apnea)   . Thyroid disease   . Vitamin D deficiency     HOSPITAL COURSE:   [name] a61 y.o.malewith a known history of diabetes on insulin, morbid obesity on CPAP, hypertension, depression, CKD stage IV comes to the emergency room after he was found to have potassium of 7.0 with elevated BUN and creatinine on routine labs done as part of annual physical.  1.acute hyperkalemia with EKG changes of peakedT waves -patient received in the ER dextrose with insulin, calcium gluconate, bicarb,valtessa -K 7.0--Rx--5.8- again 6.3 -repeat potassium came down and at 5.9 -IV fluids -nephrology consultation appreciated,a s pt have chronic High K, suggest " no need for Insuline+ dextrose inj , or calcium gluconate inj now. -tele monitoring cont - As renal func improving and K is coming down- nephrologist agreed on d/c with follow up next week in clinic to check renal func+ K. Will hold lisinopril on d/c.  2.diabetes type II -insulin and sliding scale  Hold oral meds and decreased U500 dose on discharge as in hospital - his blood sugar was around 150-200.  3.Acute on chronic kidney disease stage IV -baseline  creatinine 2.5 -4.06---3.38- 3.1- 2.93 - bicarb drip.  4.Morbid obesity with obstructive sleep apnea on CPAP  5. hypertension continue home meds  6.DVT prophylaxis subcu heparin    DISCHARGE CONDITIONS:   Stable.  CONSULTS OBTAINED:  Treatment Team:  Patrick Dana, MD  DRUG ALLERGIES:   Allergies  Allergen Reactions  . Mucinex  [Guaifenesin Er]     Throat swelling, increases heart rate  . Levaquin [Levofloxacin] Palpitations    DISCHARGE MEDICATIONS:   Allergies as of 11/21/2017      Reactions   Mucinex  [guaifenesin Er]    Throat swelling, increases heart rate   Levaquin [levofloxacin] Palpitations      Medication List    STOP taking these medications   glipiZIDE 2.5 MG 24 hr tablet Commonly known as:  GLIPIZIDE XL   lisinopril 20 MG tablet Commonly known as:  PRINIVIL,ZESTRIL   torsemide 20 MG tablet Commonly known as:  DEMADEX     TAKE these medications   albuterol 108 (90 Base) MCG/ACT inhaler Commonly known as:  PROAIR HFA USE 2 INHALATIONS EVERY 4 HOURS AS NEEDED FOR WHEEZING OR SHORTNESS OF BREATH   amitriptyline 10 MG tablet Commonly known as:  ELAVIL One tablet at bedtime for 1 week for diabetic nerve pain, then increase to 2 tablets at bedtime   amLODipine 10 MG tablet Commonly known as:  NORVASC Take 1 tablet (10 mg total) by mouth daily.   aspirin 81 MG  tablet Take 81 mg by mouth daily.   atorvastatin 40 MG tablet Commonly known as:  LIPITOR Take 1 tablet (40 mg total) by mouth daily.   B-12 PO Take 1,000 mcg by mouth daily.   CALCIUM 600 PO Take 1 tablet by mouth daily.   cetirizine 10 MG tablet Commonly known as:  ZYRTEC Take 10 mg by mouth daily.   cholecalciferol 1000 units tablet Commonly known as:  VITAMIN D Take 1,000 mcg by mouth daily.   insulin regular human CONCENTRATED 500 UNIT/ML injection Commonly known as:  HUMULIN R Take 20 U in morning and 30 U in evening, IF blood sugar is > 100 ( Check blood  sugar before taking injection), If sugar level < 100- DO NOT take any insuline. What changed:  additional instructions   levothyroxine 100 MCG tablet Commonly known as:  SYNTHROID, LEVOTHROID TAKE ONE TABLET BY MOUTH ONCE DAILY   metoprolol tartrate 50 MG tablet Commonly known as:  LOPRESSOR TAKE 1 TABLET TWICE A DAY   pantoprazole 40 MG tablet Commonly known as:  PROTONIX Take 1 tablet (40 mg total) by mouth 2 (two) times daily.   patiromer 8.4 g packet Commonly known as:  VELTASSA Take 1 packet (8.4 g total) by mouth daily for 7 days. Start taking on:  11/22/2017   testosterone cypionate 200 MG/ML injection Commonly known as:  DEPO-TESTOSTERONE Inject 1 mL (200 mg total) into the muscle every 14 (fourteen) days.   Tiotropium Bromide-Olodaterol 2.5-2.5 MCG/ACT Aers Commonly known as:  STIOLTO RESPIMAT Inhale 2 puffs into the lungs daily.   triamcinolone lotion 0.1 % Commonly known as:  KENALOG Apply 1 application topically 3 (three) times daily.   ULORIC 80 MG Tabs Generic drug:  Febuxostat Take 80 mg by mouth daily.   vitamin C 1000 MG tablet Take 1,000 mg by mouth 2 (two) times daily.        DISCHARGE INSTRUCTIONS:    Follow with Nephrology clinic in 1 week.  If you experience worsening of your admission symptoms, develop shortness of breath, life threatening emergency, suicidal or homicidal thoughts you must seek medical attention immediately by calling 911 or calling your MD immediately  if symptoms less severe.  You Must read complete instructions/literature along with all the possible adverse reactions/side effects for all the Medicines you take and that have been prescribed to you. Take any new Medicines after you have completely understood and accept all the possible adverse reactions/side effects.   Please note  You were cared for by a hospitalist during your hospital stay. If you have any questions about your discharge medications or the care you  received while you were in the hospital after you are discharged, you can call the unit and asked to speak with the hospitalist on call if the hospitalist that took care of you is not available. Once you are discharged, your primary care physician will handle any further medical issues. Please note that NO REFILLS for any discharge medications will be authorized once you are discharged, as it is imperative that you return to your primary care physician (or establish a relationship with a primary care physician if you do not have one) for your aftercare needs so that they can reassess your need for medications and monitor your lab values.    Today   CHIEF COMPLAINT:   Chief Complaint  Patient presents with  . Other    HISTORY OF PRESENT ILLNESS:  Patrick Hurst  is a 62 y.o. male with  a known history of diabetes on insulin, morbid obesity on CPAP, hypertension, depression, CKD stage IV comes to the emergency room after he was found to have potassium of 7.0 with elevated BUN and creatinine on routine labs done as part of annual physical. Patient reports he's been feeling fatigued and sleepy lately however denies any chest pain shortness of breath. He came in after he received a call regarding his potassium wife and daughter are in the room.  Patient received insulin with Dextrose, Valtessa, calcium gluconate, bicarb IV. EKG showed peaked T waves in the ER. Pt is asymptomatic.   VITAL SIGNS:  Blood pressure (!) 148/65, pulse 65, temperature 97.6 F (36.4 C), temperature source Axillary, resp. rate 18, height 5\' 11"  (1.803 m), weight 135.6 kg (299 lb), SpO2 98 %.  I/O:    Intake/Output Summary (Last 24 hours) at 11/21/2017 1412 Last data filed at 11/21/2017 1021 Gross per 24 hour  Intake 1278.33 ml  Output 3575 ml  Net -2296.67 ml    PHYSICAL EXAMINATION:   GENERAL:  62 y.o.-year-old patient lying in the bed with no acute distress.  EYES: Pupils equal, round, reactive to light and  accommodation. No scleral icterus. Extraocular muscles intact.  HEENT: Head atraumatic, normocephalic. Oropharynx and nasopharynx clear.  NECK:  Supple, no jugular venous distention. No thyroid enlargement, no tenderness.  LUNGS: Normal breath sounds bilaterally, no wheezing, rales,rhonchi or crepitation. No use of accessory muscles of respiration.  CARDIOVASCULAR: S1, S2 normal. No murmurs, rubs, or gallops.  ABDOMEN: Soft, nontender, nondistended. Bowel sounds present. No organomegaly or mass.  EXTREMITIES: No pedal edema, cyanosis, or clubbing.  NEUROLOGIC: Cranial nerves II through XII are intact. Muscle strength 5/5 in all extremities. Sensation intact. Gait not checked.  PSYCHIATRIC: The patient is alert and oriented x 3.  SKIN: No obvious rash, lesion, or ulcer.     DATA REVIEW:   CBC Recent Labs  Lab 11/19/17 1200  WBC 4.7  HGB 9.9*  HCT 28.2*  PLT 80*    Chemistries  Recent Labs  Lab 11/18/17 1012  11/21/17 0306  NA 135   < > 141  K 7.0*   < > 5.9*  CL 104   < > 111  CO2 16*   < > 24  GLUCOSE 160*   < > 158*  BUN 100*   < > 92*  CREATININE 4.06*   < > 2.93*  CALCIUM 8.1*   < > 8.5*  AST 25  --   --   ALT 34  --   --   ALKPHOS 82  --   --   BILITOT 0.4  --   --    < > = values in this interval not displayed.    Cardiac Enzymes No results for input(s): TROPONINI in the last 168 hours.  Microbiology Results  Results for orders placed or performed during the hospital encounter of 11/19/17  C difficile quick scan w PCR reflex     Status: None   Collection Time: 11/20/17  3:03 AM  Result Value Ref Range Status   C Diff antigen NEGATIVE NEGATIVE Final   C Diff toxin NEGATIVE NEGATIVE Final   C Diff interpretation No C. difficile detected.  Final    Comment: Performed at [facility], [address] [place], [place] [address]    RADIOLOGY:  US Renal  Result Date: 11/19/2017 CLINICAL DATA:  Renal failure, hyperkalemia EXAM: RENAL / URINARY  TRACT ULTRASOUND COMPLETE COMPARISON:  09/24/2015 FINDINGS: Right  Kidney: Length: 12.4 cm. Echogenicity within normal limits. No mass or hydronephrosis visualized. Left Kidney: Length: 12.1 cm. Normal echogenicity. Small anechoic cortical cyst in the lower pole measures 14 mm. No hydronephrosis. Bladder: Appears normal for degree of bladder distention. IMPRESSION: No acute finding by renal ultrasound. Negative for hydronephrosis. 14 mm left kidney lower pole renal cyst Electronically Signed   By: Jerilynn Mages.  Shick M.D.   On: 11/19/2017 15:04    EKG:   Orders placed or performed during the hospital encounter of 11/19/17  . EKG 12-Lead  . EKG 12-Lead      Management plans discussed with the patient, family and they are in agreement.  CODE STATUS: full.    Code Status Orders  (From admission, onward)        Start     Ordered   11/19/17 1520  Full code  Continuous     11/19/17 1519    Code Status History    This patient has a current code status but no historical code status.      TOTAL TIME TAKING CARE OF THIS PATIENT: 35 minutes.    Vaughan Basta M.D on 11/21/2017 at 2:12 PM  Between 7am to 6pm - Pager - [phone]  After 6pm go to [email or web address] - password [email or web address] [facility]  Office  [phone]  CC: Primary care physician; Patrick Sons, MD   Note: This dictation was prepared with Dragon dictation along with smaller phrase technology. Any transcriptional errors that result from this process are unintentional.

## 2017-11-21 NOTE — Progress Notes (Signed)
[facility], [place] 11/21/17  Subjective:   Patient known to our practice from outpatient follow-up He was admitted for hyperkalemia and acute renal failure Admission creatinine was 7.0, creatinine 4.06 with BUN of 100 Today he feels well Serum creatinine has improved along with potassium and BUN He is able to eat without nausea or vomiting  Objective:  Vital signs in last 24 hours:  Temp:  [97.6 F (36.4 C)-98.6 F (37 C)] 97.6 F (36.4 C) (07/22 0409) Pulse Rate:  [59-65] 65 (07/22 0859) Resp:  [18-20] 18 (07/22 0409) BP: (140-148)/(53-68) 148/65 (07/22 0859) SpO2:  [96 %-98 %] 98 % (07/22 0409)  Weight change:  Filed Weights   11/19/17 1158  Weight: 135.6 kg (299 lb)    Intake/Output:    Intake/Output Summary (Last 24 hours) at 11/21/2017 1406 Last data filed at 11/21/2017 1021 Gross per 24 hour  Intake 1278.33 ml  Output 3575 ml  Net -2296.67 ml     Physical Exam: General:  No acute distress, laying in the bed  HEENT  anicteric  Neck  supple  Pulm/lungs  clear to auscultation bilaterally  CVS/Heart  regular rhythm, no rub or gallop  Abdomen:   Soft, nontender  Extremities:  1-2+ edema  Neurologic:  Alert, oriented  Skin:  No acute rashes          Basic Metabolic Panel:  Recent Labs  Lab 11/18/17 1012 11/19/17 1200 11/19/17 1752 11/20/17 0720 11/20/17 1253 11/20/17 1841 11/21/17 0306  NA 135 138 141 140  --   --  141  K 7.0* 5.8* 5.6* 6.3* 6.5* 6.0* 5.9*  CL 104 111 113* 116*  --   --  111  CO2 16* 18* 18* 19*  --   --  24  GLUCOSE 160* 204* 97 132*  --   --  158*  BUN 100* 114* 111* 98*  --   --  92*  CREATININE 4.06* 3.83* 3.60* 3.17*  --   --  2.93*  CALCIUM 8.1* 7.9* 8.3* 8.2*  --   --  8.5*  PHOS  --   --   --   --  4.3  --  4.6     CBC: Recent Labs  Lab 11/19/17 1200  WBC 4.7  NEUTROABS 3.1  HGB 9.9*  HCT 28.2*  MCV 89.2  PLT 80*     No results found for: HEPBSAG, HEPBSAB,  HEPBIGM    Microbiology:  Recent Results (from the past 240 hour(s))  C difficile quick scan w PCR reflex     Status: None   Collection Time: 11/20/17  3:03 AM  Result Value Ref Range Status   C Diff antigen NEGATIVE NEGATIVE Final   C Diff toxin NEGATIVE NEGATIVE Final   C Diff interpretation No C. difficile detected.  Final    Comment: Performed at [facility], [address] [place], [place] [address]    Coagulation Studies: No results for input(s): LABPROT, INR in the last 72 hours.  Urinalysis: Recent Labs    11/19/17 1437  COLORURINE STRAW*  LABSPEC 1.008  PHURINE 5.0  GLUCOSEU NEGATIVE  HGBUR NEGATIVE  BILIRUBINUR NEGATIVE  KETONESUR NEGATIVE  PROTEINUR 30*  NITRITE NEGATIVE  LEUKOCYTESUR NEGATIVE      Imaging: US Renal  Result Date: 11/19/2017 CLINICAL DATA:  Renal failure, hyperkalemia EXAM: RENAL / URINARY TRACT ULTRASOUND COMPLETE COMPARISON:  09/24/2015 FINDINGS: Right Kidney: Length: 12.4 cm. Echogenicity within normal limits. No mass or hydronephrosis visualized. Left Kidney:  Length: 12.1 cm. Normal echogenicity. Small anechoic cortical cyst in the lower pole measures 14 mm. No hydronephrosis. Bladder: Appears normal for degree of bladder distention. IMPRESSION: No acute finding by renal ultrasound. Negative for hydronephrosis. 14 mm left kidney lower pole renal cyst Electronically Signed   By: Jerilynn Mages.  Shick M.D.   On: 11/19/2017 15:04     Medications:   .  sodium bicarbonate  infusion 1000 mL 100 mL/hr at 11/21/17 0705   . amitriptyline  10 mg Oral QHS  . amLODipine  10 mg Oral Daily  . aspirin EC  81 mg Oral Daily  . atorvastatin  40 mg Oral q1800  . calcium carbonate  3 tablet Oral Q breakfast  . cholecalciferol  1,000 Units Oral Daily  . dextrose  25 mL Intravenous Once  . heparin  5,000 Units Subcutaneous Q8H  . insulin aspart  0-5 Units Subcutaneous QHS  . insulin aspart  0-9 Units Subcutaneous TID WC  . insulin aspart  10 Units  Intravenous Once  . insulin aspart  3 Units Subcutaneous TID WC  . levothyroxine  100 mcg Oral QAC breakfast  . metoprolol tartrate  50 mg Oral BID  . pantoprazole  40 mg Oral BID  . patiromer  8.4 g Oral Daily  . pneumococcal 23 valent vaccine  0.5 mL Intramuscular Tomorrow-1000  . senna  1 tablet Oral BID  . vitamin B-12  1,000 mcg Oral Daily  . vitamin C  1,000 mg Oral BID   acetaminophen **OR** acetaminophen, albuterol  Assessment/ Plan:  62 y.o. white male with diabetes mellitus type II insulin dependent, diabetic retinopathy, diabetic neuropathy, hyperlipidemia, obstructive sleep apnea, obesity, hypertension, hypothyroidism, non-alcoholic fatty liver disease/cirrhosis admitted to [facility] on Hyperkalemia [E87.5] Uremia [N19]  1.  Acute renal failure 2.  Severe hyperkalemia 3.  Lower extremity edema 4.  Diabetes type 2 with chronic kidney disease  Acute renal failure has improved with IV hydration.  Currently lisinopril is on hold. Serum potassium also has improved with patiromer and shifting measures Discussed low potassium diet with patient.  He was drinking Gatorade every day. We have asked him to discontinue that. Limit fresh fruit, potato intake We will follow-up with him in the office next week and follow-up on labs.    LOS: [place] 7/22/20192:06 PM  [place], [place]  Note: This note was prepared with Dragon dictation. Any transcription errors are unintentional

## 2017-11-21 NOTE — Progress Notes (Signed)
Inpatient Diabetes Program Recommendations  AACE/ADA: New Consensus Statement on Inpatient Glycemic Control (2019)  Target Ranges:  Prepandial:   less than 140 mg/dL      Peak postprandial:   less than 180 mg/dL (1-2 hours)      Critically ill patients:  140 - 180 mg/dL   Results for Patrick Hurst, Patrick Hurst (MRN [id number]) as of 11/21/2017 10:02  Ref. Range 11/20/2017 07:43 11/20/2017 12:02 11/20/2017 16:36 11/20/2017 20:22 11/21/2017 07:39  Glucose-Capillary Latest Ref Range: 70 - 99 mg/dL 128 (H) 158 (H) 202 (H) 203 (H) 174 (H)   Review of Glycemic Control  Diabetes history: DM2 Outpatient Diabetes medications: Glipizide XL 10 mg BID, Humulin R U500 30 units QAM, Humulin R U500 40 units QPM Current orders for Inpatient glycemic control: Novolog 0-9 units TID with meals  Inpatient Diabetes Program Recommendations: Correction (SSI): Please consider ordering Novolog 0-5 units QHS for bedtime correction. Insulin - Meal Coverage: Please consider ordering Novolog 3 units TID with meals for meal coverage if patient eats at least 50% of meals.  Thanks, Barnie Alderman, RN, MSN, CDE Diabetes Coordinator Inpatient Diabetes Program [phone] (Team Pager from 8am to 5pm)

## 2017-11-21 NOTE — Progress Notes (Signed)
[place], [place].   11/21/2017  Patient: Patrick Hurst   Date of Birth:  [birth year]  Date of admission:  11/19/2017  Date of Discharge  11/21/2017    To Whom it May Concern:   [name]  may return to work on 11/22/17.  PHYSICAL ACTIVITY:  Moderate for next 5 days- until 11/26/17, then can have full activities.  If you have any questions or concerns, please don't hesitate to call.  Sincerely,   Vaughan Basta M.D Pager [phone] Office : [phone]   .

## 2017-11-21 NOTE — Progress Notes (Signed)
Discharge instructions given and went over with patient at bedside. Education provided regarding hyperkalemia. Prescriptions given and reviewed. All questions answered. Patient discharged home. Madlyn Frankel, RN

## 2017-11-22 LAB — PROTEIN ELECTROPHORESIS, SERUM
A/G Ratio: 1.2 (ref 0.7–1.7)
ALBUMIN ELP: 3.7 g/dL (ref 2.9–4.4)
Alpha-1-Globulin: 0.2 g/dL (ref 0.0–0.4)
Alpha-2-Globulin: 0.8 g/dL (ref 0.4–1.0)
BETA GLOBULIN: 0.8 g/dL (ref 0.7–1.3)
GAMMA GLOBULIN: 1.4 g/dL (ref 0.4–1.8)
Globulin, Total: 3.2 g/dL (ref 2.2–3.9)
TOTAL PROTEIN ELP: 6.9 g/dL (ref 6.0–8.5)

## 2017-11-22 LAB — PROTEIN ELECTRO, RANDOM URINE
ALPHA-2-GLOBULIN, U: 3.5 %
Albumin ELP, Urine: 77.6 %
Alpha-1-Globulin, U: 2.5 %
Beta Globulin, U: 7.5 %
Gamma Globulin, U: 8.8 %
TOTAL PROTEIN, URINE-UPE24: 34.7 mg/dL

## 2017-11-22 LAB — KAPPA/LAMBDA LIGHT CHAINS
KAPPA FREE LGHT CHN: 101.1 mg/L — AB (ref 3.3–19.4)
KAPPA, LAMDA LIGHT CHAIN RATIO: 1.77 — AB (ref 0.26–1.65)
Lambda free light chains: 57.2 mg/L — ABNORMAL HIGH (ref 5.7–26.3)

## 2017-11-22 LAB — HEPATITIS B SURFACE ANTIBODY,QUALITATIVE: HEP B S AB: NONREACTIVE

## 2017-11-22 LAB — HIV ANTIBODY (ROUTINE TESTING W REFLEX): HIV Screen 4th Generation wRfx: NONREACTIVE

## 2017-11-22 LAB — HEPATITIS C ANTIBODY

## 2017-11-22 LAB — HEPATITIS B CORE ANTIBODY, IGM: Hep B C IgM: NEGATIVE

## 2017-11-22 LAB — HEPATITIS B SURFACE ANTIGEN: Hepatitis B Surface Ag: NEGATIVE

## 2017-11-24 DIAGNOSIS — M1A09X0 Idiopathic chronic gout, multiple sites, without tophus (tophi): Secondary | ICD-10-CM | POA: Diagnosis not present

## 2017-11-24 DIAGNOSIS — N183 Chronic kidney disease, stage 3 (moderate): Secondary | ICD-10-CM | POA: Diagnosis not present

## 2017-11-24 DIAGNOSIS — K760 Fatty (change of) liver, not elsewhere classified: Secondary | ICD-10-CM | POA: Diagnosis not present

## 2017-11-28 ENCOUNTER — Other Ambulatory Visit: Payer: Self-pay | Admitting: Family Medicine

## 2017-11-28 DIAGNOSIS — R7989 Other specified abnormal findings of blood chemistry: Secondary | ICD-10-CM

## 2017-11-28 MED ORDER — TESTOSTERONE CYPIONATE 200 MG/ML IM SOLN: 200.0000 mg | INTRAMUSCULAR | 5 refills | Status: DC

## 2017-11-28 NOTE — Telephone Encounter (Signed)
Pt needs refill on his testosterone 200 mg /ml  [facility]  Thanks teri

## 2017-11-30 ENCOUNTER — Encounter: Payer: Self-pay | Admitting: Family Medicine

## 2017-12-01 DIAGNOSIS — E875 Hyperkalemia: Secondary | ICD-10-CM | POA: Diagnosis not present

## 2017-12-01 DIAGNOSIS — N184 Chronic kidney disease, stage 4 (severe): Secondary | ICD-10-CM | POA: Diagnosis not present

## 2017-12-01 DIAGNOSIS — E1129 Type 2 diabetes mellitus with other diabetic kidney complication: Secondary | ICD-10-CM | POA: Diagnosis not present

## 2017-12-01 DIAGNOSIS — I10 Essential (primary) hypertension: Secondary | ICD-10-CM | POA: Diagnosis not present

## 2017-12-02 DIAGNOSIS — G4733 Obstructive sleep apnea (adult) (pediatric): Secondary | ICD-10-CM | POA: Diagnosis not present

## 2017-12-12 ENCOUNTER — Other Ambulatory Visit: Payer: Self-pay | Admitting: Family Medicine

## 2017-12-12 DIAGNOSIS — E1129 Type 2 diabetes mellitus with other diabetic kidney complication: Secondary | ICD-10-CM | POA: Diagnosis not present

## 2017-12-12 DIAGNOSIS — R809 Proteinuria, unspecified: Secondary | ICD-10-CM | POA: Diagnosis not present

## 2017-12-12 DIAGNOSIS — N184 Chronic kidney disease, stage 4 (severe): Secondary | ICD-10-CM | POA: Diagnosis not present

## 2017-12-12 DIAGNOSIS — I10 Essential (primary) hypertension: Secondary | ICD-10-CM | POA: Diagnosis not present

## 2018-01-02 DIAGNOSIS — G4733 Obstructive sleep apnea (adult) (pediatric): Secondary | ICD-10-CM | POA: Diagnosis not present

## 2018-01-06 ENCOUNTER — Other Ambulatory Visit: Payer: Self-pay | Admitting: Family Medicine

## 2018-01-06 DIAGNOSIS — R7989 Other specified abnormal findings of blood chemistry: Secondary | ICD-10-CM

## 2018-01-06 MED ORDER — TESTOSTERONE CYPIONATE 200 MG/ML IM SOLN: 200.0000 mg | INTRAMUSCULAR | 1 refills | Status: DC

## 2018-01-06 NOTE — Telephone Encounter (Signed)
Please review. Thanks!  

## 2018-01-06 NOTE — Telephone Encounter (Signed)
[facility] faxed refill request for the following medications:  testosterone cypionate (DEPO-TESTOSTERONE) 200 MG/ML injection   Please advise.

## 2018-01-11 ENCOUNTER — Other Ambulatory Visit: Payer: Self-pay | Admitting: Family Medicine

## 2018-01-11 DIAGNOSIS — R7989 Other specified abnormal findings of blood chemistry: Secondary | ICD-10-CM

## 2018-01-11 MED ORDER — TESTOSTERONE CYPIONATE 200 MG/ML IM SOLN: 200.0000 mg | INTRAMUSCULAR | 1 refills | Status: DC

## 2018-01-11 NOTE — Telephone Encounter (Signed)
[facility] faxed a refill request for a 90-days supply for the following medication. Thanks [facility]  testosterone cypionate (DEPO-TESTOSTERONE) 200 MG/ML injection

## 2018-01-16 ENCOUNTER — Other Ambulatory Visit: Payer: Self-pay | Admitting: Family Medicine

## 2018-01-25 ENCOUNTER — Other Ambulatory Visit: Payer: Self-pay | Admitting: Family Medicine

## 2018-02-01 DIAGNOSIS — G4733 Obstructive sleep apnea (adult) (pediatric): Secondary | ICD-10-CM | POA: Diagnosis not present

## 2018-02-07 ENCOUNTER — Other Ambulatory Visit: Payer: Self-pay | Admitting: Family Medicine

## 2018-02-14 ENCOUNTER — Other Ambulatory Visit: Payer: Self-pay | Admitting: Family Medicine

## 2018-02-14 MED ORDER — AMITRIPTYLINE HCL 25 MG PO TABS: 25.0000 mg | ORAL_TABLET | Freq: Every day | ORAL | 3 refills | Status: DC

## 2018-02-14 NOTE — Telephone Encounter (Signed)
[facility] faxed refill request for the following medications:  amitriptyline (ELAVIL) 10 MG tablet  Qty 60  Last fill date: 01/17/2018  Please advise.

## 2018-02-14 NOTE — Telephone Encounter (Signed)
Please advise have changed amitriptylline to 25mg  tablet so he only needs to take 1 at bedtime.

## 2018-02-14 NOTE — Telephone Encounter (Signed)
Pt reports he takes two at bedtime.  He says it does provide some relief for him.   Thanks,   -[name]

## 2018-02-14 NOTE — Telephone Encounter (Signed)
Please check with patient to see how the amitriptyline is working and how many tablets he is having to take every night we know how may to call in.

## 2018-02-15 NOTE — Telephone Encounter (Signed)
Pt advised.   Thanks,   -Laura  

## 2018-02-20 ENCOUNTER — Ambulatory Visit: Payer: BLUE CROSS/BLUE SHIELD | Admitting: Family Medicine

## 2018-02-20 ENCOUNTER — Encounter: Payer: Self-pay | Admitting: Family Medicine

## 2018-02-20 ENCOUNTER — Ambulatory Visit
Admission: RE | Admit: 2018-02-20 | Discharge: 2018-02-20 | Disposition: A | Discharge status: Home / Self Care | Payer: BLUE CROSS/BLUE SHIELD | Source: Ambulatory Visit | Attending: Family Medicine | Admitting: Family Medicine

## 2018-02-20 ENCOUNTER — Telehealth: Payer: Self-pay

## 2018-02-20 VITALS — BP 160/76 | HR 59 | Temp 98.6°F | Resp 18 | Wt 314.0 lb

## 2018-02-20 DIAGNOSIS — J9811 Atelectasis: Secondary | ICD-10-CM | POA: Diagnosis not present

## 2018-02-20 DIAGNOSIS — E1140 Type 2 diabetes mellitus with diabetic neuropathy, unspecified: Secondary | ICD-10-CM

## 2018-02-20 DIAGNOSIS — R0600 Dyspnea, unspecified: Secondary | ICD-10-CM

## 2018-02-20 DIAGNOSIS — I878 Other specified disorders of veins: Secondary | ICD-10-CM | POA: Diagnosis not present

## 2018-02-20 DIAGNOSIS — E785 Hyperlipidemia, unspecified: Secondary | ICD-10-CM | POA: Diagnosis not present

## 2018-02-20 DIAGNOSIS — R0602 Shortness of breath: Secondary | ICD-10-CM | POA: Diagnosis not present

## 2018-02-20 DIAGNOSIS — I10 Essential (primary) hypertension: Secondary | ICD-10-CM | POA: Diagnosis not present

## 2018-02-20 DIAGNOSIS — R609 Edema, unspecified: Secondary | ICD-10-CM

## 2018-02-20 DIAGNOSIS — Z23 Encounter for immunization: Secondary | ICD-10-CM | POA: Diagnosis not present

## 2018-02-20 DIAGNOSIS — R0609 Other forms of dyspnea: Secondary | ICD-10-CM | POA: Diagnosis not present

## 2018-02-20 DIAGNOSIS — Z794 Long term (current) use of insulin: Secondary | ICD-10-CM | POA: Diagnosis not present

## 2018-02-20 DIAGNOSIS — K449 Diaphragmatic hernia without obstruction or gangrene: Secondary | ICD-10-CM | POA: Diagnosis not present

## 2018-02-20 DIAGNOSIS — R600 Localized edema: Secondary | ICD-10-CM | POA: Diagnosis not present

## 2018-02-20 DIAGNOSIS — I517 Cardiomegaly: Secondary | ICD-10-CM | POA: Diagnosis not present

## 2018-02-20 DIAGNOSIS — E039 Hypothyroidism, unspecified: Secondary | ICD-10-CM

## 2018-02-20 LAB — POCT GLYCOSYLATED HEMOGLOBIN (HGB A1C)
Est. average glucose Bld gHb Est-mCnc: 114
Hemoglobin A1C: 5.6 % (ref 4.0–5.6)

## 2018-02-20 MED ORDER — TORSEMIDE 20 MG PO TABS: 20.0000 mg | ORAL_TABLET | Freq: Every day | ORAL | 1 refills | Status: DC

## 2018-02-20 NOTE — Progress Notes (Signed)
Patient: Patrick Hurst Male    DOB: 01/03/1956   62 y.o.   MRN: [id number] Visit Date: 02/20/2018  Today's Provider: Lelon Huh, MD   Chief Complaint  Patient presents with  . Diabetes  . Hypertension  . Hyperlipidemia   Subjective:    HPI  Lipid/Cholesterol, Follow-up:   Last seen for this3 months ago.  Management changes since that visit include none. . Last Lipid Panel:    Component Value Date/Time   CHOL 125 11/18/2017 1012   CHOL 180 03/14/2012 0321   TRIG 449 (H) 11/18/2017 1012   TRIG 594 (H) 03/14/2012 0321   HDL 20 (L) 11/18/2017 1012   HDL 21 (L) 03/14/2012 0321   CHOLHDL 6.3 (H) 11/18/2017 1012   VLDL SEE COMMENT 03/14/2012 0321   LDLCALC Comment 11/18/2017 1012   LDLCALC SEE COMMENT 03/14/2012 0321    Risk factors for vascular disease include diabetes mellitus, hypercholesterolemia and hypertension  He reports good compliance with treatment. He is not having side effects.  Current symptoms include none and have been stable. Weight trend: increasing steadily Prior visit with dietician: no Current diet: well balanced Current exercise: none  Wt Readings from Last 3 Encounters:  02/20/18 (!) 314 lb (142.4 kg)  11/19/17 299 lb (135.6 kg)  11/18/17 299 lb (135.6 kg)    -------------------------------------------------------------------  Diabetes Mellitus Type II, Follow-up:   Lab Results  Component Value Date   HGBA1C 5.1 11/18/2017   HGBA1C 5.5 12/08/2016   HGBA1C 5.9 07/19/2016    Last seen for diabetes 3 months ago.  Management since then includes no changes. He reports good compliance with treatment. He is not having side effects.  Current symptoms include none and have been stable. Home blood sugar records: fasting range: 100-170  Episodes of hypoglycemia? no   Current Insulin Regimen: Humulin 20 units every morning and 30 units at night Most Recent Eye Exam: <1 year ago Weight trend: increasing steadily Prior visit  with dietician: no Current diet: well balanced Current exercise: none  Pertinent Labs:    Component Value Date/Time   CHOL 125 11/18/2017 1012   CHOL 180 03/14/2012 0321   TRIG 449 (H) 11/18/2017 1012   TRIG 594 (H) 03/14/2012 0321   HDL 20 (L) 11/18/2017 1012   HDL 21 (L) 03/14/2012 0321   LDLCALC Comment 11/18/2017 1012   LDLCALC SEE COMMENT 03/14/2012 0321   CREATININE 2.93 (H) 11/21/2017 0306   CREATININE 0.95 05/04/2013 1240    Wt Readings from Last 3 Encounters:  02/20/18 (!) 314 lb (142.4 kg)  11/19/17 299 lb (135.6 kg)  11/18/17 299 lb (135.6 kg)    ------------------------------------------------------------------------  Hypertension, follow-up:  BP Readings from Last 3 Encounters:  02/20/18 (!) 160/76  11/21/17 (!) 148/65  11/18/17 (!) 116/56    He was last seen for hypertension 3 months ago.  BP at that visit was 116/56. Management since that visit includes no changes. He reports good compliance with treatment. After last visit he was hospitalized for hyperkalemia and worsening kidney functions.  Since then he has followed up with Dr. [name].  He has since been taking off of lisinopril, hctz, and metformin.   He does complain of dyspnea on exertion for the last couple of weeks.  He is due for follow up Dr. [name] in November.     Current Outpatient Medications:  .  albuterol (PROAIR HFA) 108 (90 Base) MCG/ACT inhaler, USE 2 INHALATIONS EVERY 4 HOURS AS NEEDED FOR  WHEEZING OR SHORTNESS OF BREATH, Disp: 8.5 g, Rfl: 5 .  amitriptyline (ELAVIL) 25 MG tablet, Take 1 tablet (25 mg total) by mouth at bedtime., Disp: 90 tablet, Rfl: 3 .  amLODipine (NORVASC) 10 MG tablet, TAKE 1 TABLET DAILY, Disp: 90 tablet, Rfl: 4 .  Ascorbic Acid (VITAMIN C) 1000 MG tablet, Take 1,000 mg by mouth 2 (two) times daily. , Disp: , Rfl:  .  aspirin 81 MG tablet, Take 81 mg by mouth daily., Disp: , Rfl:  .  atorvastatin (LIPITOR) 40 MG tablet, Take 1 tablet (40 mg total) by mouth  daily., Disp: 90 tablet, Rfl: 4 .  Calcium Carbonate (CALCIUM 600 PO), Take 1 tablet by mouth daily., Disp: , Rfl:  .  cetirizine (ZYRTEC) 10 MG tablet, Take 10 mg by mouth daily. , Disp: , Rfl:  .  cholecalciferol (VITAMIN D) 1000 units tablet, Take 1,000 mcg by mouth daily. , Disp: , Rfl:  .  Cyanocobalamin (B-12 PO), Take 1,000 mcg by mouth daily. , Disp: , Rfl:  .  insulin regular human CONCENTRATED (HUMULIN R) 500 UNIT/ML injection, Take 20 U in morning and 30 U in evening, IF blood sugar is > 100 ( Check blood sugar before taking injection), If sugar level < 100- DO NOT take any insuline., Disp: 60 mL, Rfl: 5 .  levothyroxine (SYNTHROID, LEVOTHROID) 100 MCG tablet, TAKE ONE TABLET BY MOUTH ONCE DAILY, Disp: 30 tablet, Rfl: 12 .  metoprolol tartrate (LOPRESSOR) 50 MG tablet, TAKE 1 TABLET TWICE A DAY, Disp: 180 tablet, Rfl: 4 .  pantoprazole (PROTONIX) 40 MG tablet, Take 1 tablet (40 mg total) by mouth 2 (two) times daily., Disp: 180 tablet, Rfl: 4 .  testosterone cypionate (DEPO-TESTOSTERONE) 200 MG/ML injection, Inject 1 mL (200 mg total) into the muscle every 14 (fourteen) days., Disp: 10 mL, Rfl: 1 .  triamcinolone lotion (KENALOG) 0.1 %, Apply 1 application topically 3 (three) times daily., Disp: 60 mL, Rfl: 0 .  ULORIC 80 MG TABS, Take 80 mg by mouth daily. , Disp: , Rfl:  .  Tiotropium Bromide-Olodaterol (STIOLTO RESPIMAT) 2.5-2.5 MCG/ACT AERS, Inhale 2 puffs into the lungs daily. (Patient not taking: Reported on 02/20/2018), Disp: 1 Inhaler, Rfl: 0   Weight trend: increasing steadily Wt Readings from Last 3 Encounters:  02/20/18 (!) 314 lb (142.4 kg)  11/19/17 299 lb (135.6 kg)  11/18/17 299 lb (135.6 kg)    Current diet: well balanced  ------------------------------------------------------------------------ Hypothyroidism: Patient was last seen for this problem 3 months ago and no changes were made.  Lab Results  Component Value Date   TSH 3.880 11/18/2017        Allergies  Allergen Reactions  . Mucinex  [Guaifenesin Er]     Throat swelling, increases heart rate  . Levaquin [Levofloxacin] Palpitations     Current Outpatient Medications:  .  albuterol (PROAIR HFA) 108 (90 Base) MCG/ACT inhaler, USE 2 INHALATIONS EVERY 4 HOURS AS NEEDED FOR WHEEZING OR SHORTNESS OF BREATH, Disp: 8.5 g, Rfl: 5 .  amitriptyline (ELAVIL) 25 MG tablet, Take 1 tablet (25 mg total) by mouth at bedtime., Disp: 90 tablet, Rfl: 3 .  amLODipine (NORVASC) 10 MG tablet, TAKE 1 TABLET DAILY, Disp: 90 tablet, Rfl: 4 .  Ascorbic Acid (VITAMIN C) 1000 MG tablet, Take 1,000 mg by mouth 2 (two) times daily. , Disp: , Rfl:  .  aspirin 81 MG tablet, Take 81 mg by mouth daily., Disp: , Rfl:  .  atorvastatin (LIPITOR) 40 MG  tablet, Take 1 tablet (40 mg total) by mouth daily., Disp: 90 tablet, Rfl: 4 .  Calcium Carbonate (CALCIUM 600 PO), Take 1 tablet by mouth daily., Disp: , Rfl:  .  cetirizine (ZYRTEC) 10 MG tablet, Take 10 mg by mouth daily. , Disp: , Rfl:  .  cholecalciferol (VITAMIN D) 1000 units tablet, Take 1,000 mcg by mouth daily. , Disp: , Rfl:  .  Cyanocobalamin (B-12 PO), Take 1,000 mcg by mouth daily. , Disp: , Rfl:  .  insulin regular human CONCENTRATED (HUMULIN R) 500 UNIT/ML injection, Take 20 U in morning and 30 U in evening, IF blood sugar is > 100 ( Check blood sugar before taking injection), If sugar level < 100- DO NOT take any insuline., Disp: 60 mL, Rfl: 5 .  levothyroxine (SYNTHROID, LEVOTHROID) 100 MCG tablet, TAKE ONE TABLET BY MOUTH ONCE DAILY, Disp: 30 tablet, Rfl: 12 .  metoprolol tartrate (LOPRESSOR) 50 MG tablet, TAKE 1 TABLET TWICE A DAY, Disp: 180 tablet, Rfl: 4 .  pantoprazole (PROTONIX) 40 MG tablet, Take 1 tablet (40 mg total) by mouth 2 (two) times daily., Disp: 180 tablet, Rfl: 4 .  testosterone cypionate (DEPO-TESTOSTERONE) 200 MG/ML injection, Inject 1 mL (200 mg total) into the muscle every 14 (fourteen) days., Disp: 10 mL, Rfl: 1 .  triamcinolone  lotion (KENALOG) 0.1 %, Apply 1 application topically 3 (three) times daily., Disp: 60 mL, Rfl: 0 .  ULORIC 80 MG TABS, Take 80 mg by mouth daily. , Disp: , Rfl:  .  Tiotropium Bromide-Olodaterol (STIOLTO RESPIMAT) 2.5-2.5 MCG/ACT AERS, Inhale 2 puffs into the lungs daily. (Patient not taking: Reported on 02/20/2018), Disp: 1 Inhaler, Rfl: 0  Review of Systems  Constitutional: Negative for appetite change, chills and fever.  Respiratory: Positive for shortness of breath. Negative for chest tightness and wheezing.   Cardiovascular: Positive for leg swelling. Negative for chest pain and palpitations.  Gastrointestinal: Negative for abdominal pain, nausea and vomiting.    Social History   Tobacco Use  . Smoking status: Former Smoker    Packs/day: 1.00    Years: 15.00    Pack years: 15.00    Types: Cigarettes    Last attempt to quit: 05/03/1978    Years since quitting: 39.8  . Smokeless tobacco: Former Systems developer  . Tobacco comment: started smoking at age 25  Substance Use Topics  . Alcohol use: No    Alcohol/week: 0.0 standard drinks   Objective:   BP (!) 160/76 (BP Location: Left Arm, Patient Position: Sitting, Cuff Size: Large)   Pulse (!) 59   Temp 98.6 F (37 C) (Oral)   Resp 18   Wt (!) 314 lb (142.4 kg)   SpO2 95% Comment: room air  BMI 43.79 kg/m  Vitals:   02/20/18 0837  BP: (!) 160/76  Pulse: (!) 59  Resp: 18  Temp: 98.6 F (37 C)  TempSrc: Oral  SpO2: 95%  Weight: (!) 314 lb (142.4 kg)    Wt Readings from Last 5 Encounters:  02/20/18 (!) 314 lb (142.4 kg)  11/19/17 299 lb (135.6 kg)  11/18/17 299 lb (135.6 kg)  06/23/17 (!) 309 lb (140.2 kg)  05/28/17 (!) 303 lb 6.4 oz (137.6 kg)     Physical Exam   General Appearance:    Alert, cooperative, no distress  Eyes:    PERRL, conjunctiva/corneas clear, EOM's intact       Lungs:     Clear to auscultation bilaterally, respirations unlabored  Heart:  Regular rate and rhythm, 2+ pedal edema.   Neurologic:    Awake, alert, oriented x 3. No apparent focal neurological           defect.       Results for orders placed or performed in visit on 02/20/18  POCT HgB A1C  Result Value Ref Range   Hemoglobin A1C 5.6 4.0 - 5.6 %   HbA1c POC (<> result, manual entry)     HbA1c, POC (prediabetic range)     HbA1c, POC (controlled diabetic range)     Est. average glucose Bld gHb Est-mCnc 114    Dg Chest 2 View  Result Date: 02/20/2018 IMPRESSION: Cardiomegaly with pulmonary vascular congestion. Mild bibasilar interstitial edema. No consolidation. Mild right base atelectasis. Small hiatal hernia. Electronically Signed   By: Lowella Grip III M.D.   On: 02/20/2018 11:00       Assessment & Plan:     1. Type 2 diabetes mellitus with diabetic neuropathy, with long-term current use of insulin (HCC) Very well controlled. Continue current medications.   - POCT HgB A1C - Comprehensive metabolic panel  2. Dyspnea on exertion  - CBC - Brain natriuretic peptide - DG Chest 2 View; Future  3. Edema, unspecified type Has been off of lisinopril and hctz since hospitalization for renal failure. Well start on torsemide until his follow up with Dr. [name].   4. Hypertension, unspecified type Worse off of hctz and lisinopril. Labs pending. Start torsemide for the time being.   5. Hypothyroidism, unspecified type Lab Results  Component Value Date   TSH 3.880 11/18/2017    - TSH  6. Hyperlipidemia, unspecified hyperlipidemia type He is tolerating atorvastatin well with no adverse effects.   - Lipid panel  7. Need for influenza vaccination  - Flu Vaccine QUAD 6+ mos PF IM (Fluarix Quad PF)       Lelon Huh, MD  [facility]

## 2018-02-20 NOTE — Telephone Encounter (Signed)
Pt advised.  RX sent to [facility] [address]   Thanks,   -Katheen Aslin  

## 2018-02-20 NOTE — Telephone Encounter (Signed)
-----   Message from [name], MD sent at 02/20/2018 12:06 PM EDT ----- Chest xray shows some fluid build up around lungs which is causing his shortness of breath. Need to start back on torsemide 20mg  once a day, #30, rf x 1. Lab tests should be back by Wednesday

## 2018-02-21 ENCOUNTER — Other Ambulatory Visit: Payer: Self-pay | Admitting: Family Medicine

## 2018-02-21 ENCOUNTER — Telehealth: Payer: Self-pay

## 2018-02-21 DIAGNOSIS — R0600 Dyspnea, unspecified: Secondary | ICD-10-CM

## 2018-02-21 DIAGNOSIS — R0609 Other forms of dyspnea: Principal | ICD-10-CM

## 2018-02-21 DIAGNOSIS — R7989 Other specified abnormal findings of blood chemistry: Secondary | ICD-10-CM

## 2018-02-21 LAB — COMPREHENSIVE METABOLIC PANEL
A/G RATIO: 1.7 (ref 1.2–2.2)
ALK PHOS: 97 IU/L (ref 39–117)
ALT: 19 IU/L (ref 0–44)
AST: 23 IU/L (ref 0–40)
Albumin: 3.8 g/dL (ref 3.6–4.8)
BILIRUBIN TOTAL: 0.5 mg/dL (ref 0.0–1.2)
BUN/Creatinine Ratio: 10 (ref 10–24)
BUN: 25 mg/dL (ref 8–27)
CHLORIDE: 108 mmol/L — AB (ref 96–106)
CO2: 23 mmol/L (ref 20–29)
Calcium: 7.4 mg/dL — ABNORMAL LOW (ref 8.6–10.2)
Creatinine, Ser: 2.6 mg/dL — ABNORMAL HIGH (ref 0.76–1.27)
GFR calc Af Amer: 30 mL/min/{1.73_m2} — ABNORMAL LOW (ref >59)
GFR calc non Af Amer: 26 mL/min/{1.73_m2} — ABNORMAL LOW (ref >59)
Globulin, Total: 2.2 g/dL (ref 1.5–4.5)
Glucose: 95 mg/dL (ref 65–99)
POTASSIUM: 4.7 mmol/L (ref 3.5–5.2)
Sodium: 144 mmol/L (ref 134–144)
Total Protein: 6 g/dL (ref 6.0–8.5)

## 2018-02-21 LAB — CBC
Hematocrit: 30 % — ABNORMAL LOW (ref 37.5–51.0)
Hemoglobin: 9.8 g/dL — ABNORMAL LOW (ref 13.0–17.7)
MCH: 28.5 pg (ref 26.6–33.0)
MCHC: 32.7 g/dL (ref 31.5–35.7)
MCV: 87 fL (ref 79–97)
PLATELETS: 108 10*3/uL — AB (ref 150–450)
RBC: 3.44 x10E6/uL — ABNORMAL LOW (ref 4.14–5.80)
RDW: 14.9 % (ref 12.3–15.4)
WBC: 5.9 10*3/uL (ref 3.4–10.8)

## 2018-02-21 LAB — LIPID PANEL
CHOL/HDL RATIO: 4.2 ratio (ref 0.0–5.0)
CHOLESTEROL TOTAL: 101 mg/dL (ref 100–199)
HDL: 24 mg/dL — ABNORMAL LOW (ref >39)
LDL Calculated: 35 mg/dL (ref 0–99)
Triglycerides: 208 mg/dL — ABNORMAL HIGH (ref 0–149)
VLDL Cholesterol Cal: 42 mg/dL — ABNORMAL HIGH (ref 5–40)

## 2018-02-21 LAB — TSH: TSH: 5.34 u[IU]/mL — ABNORMAL HIGH (ref 0.450–4.500)

## 2018-02-21 LAB — BRAIN NATRIURETIC PEPTIDE: BNP: 104.8 pg/mL — ABNORMAL HIGH (ref 0.0–100.0)

## 2018-02-21 NOTE — Telephone Encounter (Signed)
-----   Message from [name], MD sent at 02/21/2018  2:05 PM EDT ----- BNP Is elevated, this is usually an indication of weakness or stress on heart. Need echocardiogram. Have sent order to sar[facility] to schedule. Please advise patient.

## 2018-02-21 NOTE — Telephone Encounter (Signed)
Patient advised as directed below.  Thanks,  -Sotero Brinkmeyer 

## 2018-02-22 ENCOUNTER — Telehealth: Payer: Self-pay | Admitting: Family Medicine

## 2018-02-22 NOTE — Telephone Encounter (Signed)
Pt is scheduled for echo at [facility] 02/24/18 at 11:00.Pt advised.Auth # [id number] 02/22/18 through 03/23/18.Phone [phone]

## 2018-02-24 ENCOUNTER — Ambulatory Visit
Admission: RE | Admit: 2018-02-24 | Discharge: 2018-02-24 | Disposition: A | Discharge status: Home / Self Care | Payer: BLUE CROSS/BLUE SHIELD | Source: Ambulatory Visit | Attending: Family Medicine | Admitting: Family Medicine

## 2018-02-24 DIAGNOSIS — K219 Gastro-esophageal reflux disease without esophagitis: Secondary | ICD-10-CM | POA: Insufficient documentation

## 2018-02-24 DIAGNOSIS — E785 Hyperlipidemia, unspecified: Secondary | ICD-10-CM | POA: Diagnosis not present

## 2018-02-24 DIAGNOSIS — I119 Hypertensive heart disease without heart failure: Secondary | ICD-10-CM | POA: Insufficient documentation

## 2018-02-24 DIAGNOSIS — G4733 Obstructive sleep apnea (adult) (pediatric): Secondary | ICD-10-CM | POA: Diagnosis not present

## 2018-02-24 DIAGNOSIS — D649 Anemia, unspecified: Secondary | ICD-10-CM | POA: Diagnosis not present

## 2018-02-24 DIAGNOSIS — I081 Rheumatic disorders of both mitral and tricuspid valves: Secondary | ICD-10-CM | POA: Insufficient documentation

## 2018-02-24 DIAGNOSIS — E119 Type 2 diabetes mellitus without complications: Secondary | ICD-10-CM | POA: Insufficient documentation

## 2018-02-24 DIAGNOSIS — E039 Hypothyroidism, unspecified: Secondary | ICD-10-CM | POA: Insufficient documentation

## 2018-02-24 DIAGNOSIS — R7989 Other specified abnormal findings of blood chemistry: Secondary | ICD-10-CM | POA: Insufficient documentation

## 2018-02-24 NOTE — Progress Notes (Signed)
*  PRELIMINARY RESULTS* Echocardiogram 2D Echocardiogram has been performed.  Patrick Hurst 02/24/2018, 11:36 AM

## 2018-02-27 ENCOUNTER — Telehealth: Payer: Self-pay

## 2018-02-27 ENCOUNTER — Encounter: Payer: Self-pay | Admitting: Family Medicine

## 2018-02-27 DIAGNOSIS — I5032 Chronic diastolic (congestive) heart failure: Secondary | ICD-10-CM | POA: Insufficient documentation

## 2018-02-27 DIAGNOSIS — I5189 Other ill-defined heart diseases: Secondary | ICD-10-CM | POA: Insufficient documentation

## 2018-02-27 NOTE — Telephone Encounter (Signed)
-----   Message from [name], MD sent at 02/27/2018 12:25 PM EDT ----- Echocardiogram shows some thickening of heart muscles due to strain of high blood pressure. Should improve since getting back on torsemide prescribed last week. Need follow up in 3 weeks to check on BP, swelling and kidney functions.

## 2018-02-27 NOTE — Telephone Encounter (Signed)
Pt advised.  Apt made for 03/20/2018  Thanks,   -[name]

## 2018-03-02 DIAGNOSIS — R600 Localized edema: Secondary | ICD-10-CM | POA: Diagnosis not present

## 2018-03-02 DIAGNOSIS — E1122 Type 2 diabetes mellitus with diabetic chronic kidney disease: Secondary | ICD-10-CM | POA: Diagnosis not present

## 2018-03-02 DIAGNOSIS — N184 Chronic kidney disease, stage 4 (severe): Secondary | ICD-10-CM | POA: Diagnosis not present

## 2018-03-02 DIAGNOSIS — I129 Hypertensive chronic kidney disease with stage 1 through stage 4 chronic kidney disease, or unspecified chronic kidney disease: Secondary | ICD-10-CM | POA: Diagnosis not present

## 2018-03-04 DIAGNOSIS — G4733 Obstructive sleep apnea (adult) (pediatric): Secondary | ICD-10-CM | POA: Diagnosis not present

## 2018-03-20 ENCOUNTER — Ambulatory Visit: Payer: BLUE CROSS/BLUE SHIELD | Admitting: Family Medicine

## 2018-03-20 ENCOUNTER — Encounter: Payer: Self-pay | Admitting: Family Medicine

## 2018-03-20 VITALS — BP 164/80 | HR 72 | Temp 98.8°F | Resp 16 | Wt 305.0 lb

## 2018-03-20 DIAGNOSIS — R609 Edema, unspecified: Secondary | ICD-10-CM

## 2018-03-20 DIAGNOSIS — N179 Acute kidney failure, unspecified: Secondary | ICD-10-CM | POA: Diagnosis not present

## 2018-03-20 DIAGNOSIS — R0600 Dyspnea, unspecified: Secondary | ICD-10-CM

## 2018-03-20 DIAGNOSIS — R0609 Other forms of dyspnea: Secondary | ICD-10-CM | POA: Diagnosis not present

## 2018-03-20 DIAGNOSIS — E291 Testicular hypofunction: Secondary | ICD-10-CM

## 2018-03-20 DIAGNOSIS — I10 Essential (primary) hypertension: Secondary | ICD-10-CM | POA: Diagnosis not present

## 2018-03-20 MED ORDER — TADALAFIL 5 MG PO TABS: 5.0000 mg | ORAL_TABLET | Freq: Every day | ORAL | 5 refills | Status: DC | PRN

## 2018-03-20 NOTE — Patient Instructions (Signed)
You can install the GoodRx app on your smart phone to find the lowest prices for generic medications.

## 2018-03-20 NOTE — Progress Notes (Signed)
Patient: Patrick Hurst Male    DOB: 1955-09-08   62 y.o.   MRN: [id number] Visit Date: 03/20/2018  Today's Provider: Lelon Huh, MD   Chief Complaint  Patient presents with  . Hypertension   Subjective:    HPI  Hypertension, follow-up:  BP Readings from Last 3 Encounters:  03/20/18 (!) 164/80  02/20/18 (!) 160/76  11/21/17 (!) 148/65    He was last seen for hypertension 1 months ago.  BP at that visit was 160/76. Management since that visit includes starting Torsemide.  He reports good compliance with treatment. Since last visit, patient states his Nephrologist doubled the dose of Torsemide to 20mg  twice daily at last visit on 03/02/2018  However he doesn't feel swelling or dyspnea have improved much since increase in dosage.  He is not having side effects.  He is not exercising. He is adherent to low salt diet.   Outside blood pressures are averaging 180/90. He is experiencing lower extremity edema.  Patient denies chest pain, chest pressure/discomfort, claudication, exertional chest pressure/discomfort, irregular heart beat, near-syncope, orthopnea, palpitations, paroxysmal nocturnal dyspnea, syncope and tachypnea.   Cardiovascular risk factors include advanced age (older than 1 for men, 63 for women), hypertension and male gender.  Use of agents associated with hypertension: NSAIDS.     Weight trend: decreasing steadily Wt Readings from Last 3 Encounters:  03/20/18 (!) 305 lb (138.3 kg)  02/20/18 (!) 314 lb (142.4 kg)  11/19/17 299 lb (135.6 kg)    Current diet: well balanced  ------------------------------------------------------------------------  Follow up ED and low testosterone Lab Results  Component Value Date   TESTOSTERONE 537 11/18/2017   He reports he is using testosterone consistently every two weeks, however he has not noticed any significant improvement in ED. He would like to try ED medication.     Allergies  Allergen Reactions    . Mucinex  [Guaifenesin Er]     Throat swelling, increases heart rate  . Levaquin [Levofloxacin] Palpitations     Current Outpatient Medications:  .  albuterol (PROAIR HFA) 108 (90 Base) MCG/ACT inhaler, USE 2 INHALATIONS EVERY 4 HOURS AS NEEDED FOR WHEEZING OR SHORTNESS OF BREATH, Disp: 8.5 g, Rfl: 5 .  amitriptyline (ELAVIL) 25 MG tablet, Take 1 tablet (25 mg total) by mouth at bedtime., Disp: 90 tablet, Rfl: 3 .  amLODipine (NORVASC) 10 MG tablet, TAKE 1 TABLET DAILY, Disp: 90 tablet, Rfl: 4 .  Ascorbic Acid (VITAMIN C) 1000 MG tablet, Take 1,000 mg by mouth 2 (two) times daily. , Disp: , Rfl:  .  aspirin 81 MG tablet, Take 81 mg by mouth daily., Disp: , Rfl:  .  atorvastatin (LIPITOR) 40 MG tablet, Take 1 tablet (40 mg total) by mouth daily., Disp: 90 tablet, Rfl: 4 .  Calcium Carbonate (CALCIUM 600 PO), Take 1 tablet by mouth daily., Disp: , Rfl:  .  cetirizine (ZYRTEC) 10 MG tablet, Take 10 mg by mouth daily. , Disp: , Rfl:  .  cholecalciferol (VITAMIN D) 1000 units tablet, Take 1,000 mcg by mouth daily. , Disp: , Rfl:  .  Cyanocobalamin (B-12 PO), Take 1,000 mcg by mouth daily. , Disp: , Rfl:  .  insulin regular human CONCENTRATED (HUMULIN R) 500 UNIT/ML injection, Take 20 U in morning and 30 U in evening, IF blood sugar is > 100 ( Check blood sugar before taking injection), If sugar level < 100- DO NOT take any insuline., Disp: 60 mL, Rfl: 5 .  levothyroxine (SYNTHROID, LEVOTHROID) 100 MCG tablet, TAKE ONE TABLET BY MOUTH ONCE DAILY, Disp: 30 tablet, Rfl: 12 .  metoprolol tartrate (LOPRESSOR) 50 MG tablet, TAKE 1 TABLET TWICE A DAY, Disp: 180 tablet, Rfl: 4 .  pantoprazole (PROTONIX) 40 MG tablet, Take 1 tablet (40 mg total) by mouth 2 (two) times daily., Disp: 180 tablet, Rfl: 4 .  testosterone cypionate (DEPO-TESTOSTERONE) 200 MG/ML injection, Inject 1 mL (200 mg total) into the muscle every 14 (fourteen) days., Disp: 10 mL, Rfl: 1 .  Tiotropium Bromide-Olodaterol (STIOLTO RESPIMAT)  2.5-2.5 MCG/ACT AERS, Inhale 2 puffs into the lungs daily., Disp: 1 Inhaler, Rfl: 0 .  torsemide (DEMADEX) 20 MG tablet, Take 1 tablet (20 mg total) by mouth daily. (Patient taking differently: Take 20 mg by mouth 2 (two) times daily. ), Disp: 30 tablet, Rfl: 1 .  triamcinolone lotion (KENALOG) 0.1 %, Apply 1 application topically 3 (three) times daily., Disp: 60 mL, Rfl: 0 .  ULORIC 80 MG TABS, Take 80 mg by mouth daily. , Disp: , Rfl:   Review of Systems  Constitutional: Negative for appetite change, chills and fever.  Respiratory: Positive for shortness of breath. Negative for chest tightness and wheezing.   Cardiovascular: Positive for leg swelling. Negative for chest pain and palpitations.  Gastrointestinal: Negative for abdominal pain, nausea and vomiting.    Social History   Tobacco Use  . Smoking status: Former Smoker    Packs/day: 1.00    Years: 15.00    Pack years: 15.00    Types: Cigarettes    Last attempt to quit: 05/03/1978    Years since quitting: 39.9  . Smokeless tobacco: Former Systems developer  . Tobacco comment: started smoking at age 75  Substance Use Topics  . Alcohol use: No    Alcohol/week: 0.0 standard drinks   Objective:   BP (!) 164/80 (BP Location: Left Arm, Patient Position: Sitting, Cuff Size: Large)   Pulse 72   Temp 98.8 F (37.1 C) (Oral)   Resp 16   Wt (!) 305 lb (138.3 kg)   SpO2 95% Comment: room air  BMI 42.54 kg/m  Vitals:   03/20/18 1624  BP: (!) 164/80  Pulse: 72  Resp: 16  Temp: 98.8 F (37.1 C)  TempSrc: Oral  SpO2: 95%  Weight: (!) 305 lb (138.3 kg)     Physical Exam  General Appearance:    Alert, cooperative, no distress, obese  Eyes:    PERRL, conjunctiva/corneas clear, EOM's intact       Lungs:     Clear to auscultation bilaterally, respirations unlabored  Heart:    Regular rate and rhythm  Ext::   3+ bipedal edema.           Assessment & Plan:     1. Edema, unspecified type Slight improvement with increased dose of  torsemide. However, not back to baseline since taken off ACEI due to hyperkalemia. Consider adding metolazone after reviewing albs.   2. Dyspnea on exertion Improved since starting on torsemide, but not back to baseline. consider  3. Hypertension, unspecified type Consider adding thiazide.   4. Acute kidney injury ([place]) Follow up nephrology in December as scheduled.  - Renal function panel  5. Hypogonadism, male  - Testosterone,Free and Total Printed prescription for cialis      Lelon Huh, MD  [facility]

## 2018-03-21 DIAGNOSIS — N179 Acute kidney failure, unspecified: Secondary | ICD-10-CM | POA: Diagnosis not present

## 2018-03-21 DIAGNOSIS — E291 Testicular hypofunction: Secondary | ICD-10-CM | POA: Diagnosis not present

## 2018-03-22 ENCOUNTER — Telehealth: Payer: Self-pay

## 2018-03-22 LAB — RENAL FUNCTION PANEL
ALBUMIN: 3.9 g/dL (ref 3.6–4.8)
BUN/Creatinine Ratio: 15 (ref 10–24)
BUN: 47 mg/dL — ABNORMAL HIGH (ref 8–27)
CALCIUM: 7.9 mg/dL — AB (ref 8.6–10.2)
CO2: 20 mmol/L (ref 20–29)
CREATININE: 3.17 mg/dL — AB (ref 0.76–1.27)
Chloride: 102 mmol/L (ref 96–106)
GFR calc Af Amer: 23 mL/min/{1.73_m2} — ABNORMAL LOW (ref >59)
GFR, EST NON AFRICAN AMERICAN: 20 mL/min/{1.73_m2} — AB (ref >59)
GLUCOSE: 195 mg/dL — AB (ref 65–99)
PHOSPHORUS: 4.7 mg/dL — AB (ref 2.5–4.5)
Potassium: 4.8 mmol/L (ref 3.5–5.2)
Sodium: 140 mmol/L (ref 134–144)

## 2018-03-22 LAB — TESTOSTERONE,FREE AND TOTAL
TESTOSTERONE FREE: 24.3 pg/mL — AB (ref 6.6–18.1)
Testosterone: 934 ng/dL — ABNORMAL HIGH (ref 264–916)

## 2018-03-22 MED ORDER — METOLAZONE 2.5 MG PO TABS: 2.5000 mg | ORAL_TABLET | Freq: Every day | ORAL | 2 refills | Status: DC

## 2018-03-22 NOTE — Telephone Encounter (Signed)
-----   Message from [name], MD sent at 03/22/2018  8:00 AM EST ----- Electrolytes are good. Start metolazone 2.5mg  one tablet daily for fluid, #30, rf x 2. Continue torsemide. Follow up 8 weeks for BP, diabetes and follow up swelling.

## 2018-03-22 NOTE — Telephone Encounter (Signed)
Patient was advised and appointment schedule for 05/23/2018 @ 8:00 a.m.

## 2018-03-29 ENCOUNTER — Telehealth: Payer: Self-pay | Admitting: Family Medicine

## 2018-03-29 DIAGNOSIS — R609 Edema, unspecified: Secondary | ICD-10-CM

## 2018-03-29 NOTE — Telephone Encounter (Signed)
Please check with patient to see how addition of metolazone is working for his swelling. If he is going to stay on it, need to stop by lab and check renal panel in the next couple of days to make sure it is not causing any trouble with kidneys or electrolytes. Please print order for renal panel and leave at lab for him.

## 2018-03-31 NOTE — Telephone Encounter (Signed)
I called patient and he states the swelling in his ankles has improved since starting Metolazone. He is tolerating medication well. Patient agrees to have labs checked next week. Lab order printed and left up front for pick up.

## 2018-04-03 DIAGNOSIS — G4733 Obstructive sleep apnea (adult) (pediatric): Secondary | ICD-10-CM | POA: Diagnosis not present

## 2018-04-04 DIAGNOSIS — R609 Edema, unspecified: Secondary | ICD-10-CM | POA: Diagnosis not present

## 2018-04-05 LAB — RENAL FUNCTION PANEL
Albumin: 4.3 g/dL (ref 3.6–4.8)
BUN / CREAT RATIO: 17 (ref 10–24)
BUN: 81 mg/dL (ref 8–27)
CALCIUM: 7.3 mg/dL — AB (ref 8.6–10.2)
CHLORIDE: 101 mmol/L (ref 96–106)
CO2: 18 mmol/L — AB (ref 20–29)
Creatinine, Ser: 4.65 mg/dL — ABNORMAL HIGH (ref 0.76–1.27)
GFR calc non Af Amer: 13 mL/min/{1.73_m2} — ABNORMAL LOW (ref >59)
GFR, EST AFRICAN AMERICAN: 15 mL/min/{1.73_m2} — AB (ref >59)
GLUCOSE: 93 mg/dL (ref 65–99)
POTASSIUM: 4.3 mmol/L (ref 3.5–5.2)
Phosphorus: 5.4 mg/dL — ABNORMAL HIGH (ref 2.5–4.5)
Sodium: 145 mmol/L — ABNORMAL HIGH (ref 134–144)

## 2018-04-06 ENCOUNTER — Other Ambulatory Visit: Payer: Self-pay | Admitting: Family Medicine

## 2018-04-06 DIAGNOSIS — N179 Acute kidney failure, unspecified: Secondary | ICD-10-CM

## 2018-04-12 NOTE — Telephone Encounter (Signed)
Please check with patient and advise we haven't gotten results of follow up kidney function tests this week. His kidney functions had gotten worse when checked last week and we need to make sure they are improving since stopping the metolazone. Need results before the end of the week.

## 2018-04-12 NOTE — Telephone Encounter (Signed)
Pt states he has an appointment with his Kidney specialist tomorrow afternoon.  He says he will have them order the lab work and send it to you.   Thanks,   -[name]

## 2018-04-13 ENCOUNTER — Encounter: Payer: Self-pay | Admitting: Nephrology

## 2018-04-13 DIAGNOSIS — N183 Chronic kidney disease, stage 3 (moderate): Secondary | ICD-10-CM | POA: Diagnosis not present

## 2018-04-13 DIAGNOSIS — R600 Localized edema: Secondary | ICD-10-CM | POA: Diagnosis not present

## 2018-04-13 DIAGNOSIS — I129 Hypertensive chronic kidney disease with stage 1 through stage 4 chronic kidney disease, or unspecified chronic kidney disease: Secondary | ICD-10-CM | POA: Diagnosis not present

## 2018-04-13 DIAGNOSIS — E1129 Type 2 diabetes mellitus with other diabetic kidney complication: Secondary | ICD-10-CM | POA: Diagnosis not present

## 2018-04-18 DIAGNOSIS — E113592 Type 2 diabetes mellitus with proliferative diabetic retinopathy without macular edema, left eye: Secondary | ICD-10-CM | POA: Diagnosis not present

## 2018-04-18 LAB — HM DIABETES EYE EXAM

## 2018-04-21 ENCOUNTER — Encounter: Payer: Self-pay | Admitting: Family Medicine

## 2018-05-23 ENCOUNTER — Ambulatory Visit: Payer: Commercial Managed Care - PPO | Admitting: Family Medicine

## 2018-05-23 ENCOUNTER — Encounter: Payer: Self-pay | Admitting: Family Medicine

## 2018-05-23 VITALS — BP 172/79 | HR 65 | Temp 98.9°F | Resp 18 | Wt 303.0 lb

## 2018-05-23 DIAGNOSIS — I5189 Other ill-defined heart diseases: Secondary | ICD-10-CM

## 2018-05-23 DIAGNOSIS — I10 Essential (primary) hypertension: Secondary | ICD-10-CM

## 2018-05-23 MED ORDER — HYDRALAZINE HCL 10 MG PO TABS: ORAL_TABLET | ORAL | 0 refills | Status: DC

## 2018-05-23 NOTE — Patient Instructions (Addendum)
.   Please bring all of your medications to every appointment so we can make sure that our medication list is the same as yours.    Stay away from all salty foods which can cause swelling and high blood pressure   Never take OTC ibuprofen, naproxen, or ketoprofen   You can take torsemide once every day, and you can take a second occasionally if you feel more short of breath of breath than usual

## 2018-05-23 NOTE — Progress Notes (Signed)
Patient: Patrick Hurst Male    DOB: 02/04/1956   63 y.o.   MRN: [id number] Visit Date: 05/23/2018  Today's Provider: Lelon Huh, MD   Chief Complaint  Patient presents with  . Follow-up   Subjective:     HPI  Follow up for Edema and shortness of breath  The patient was last seen for this 8 weeks ago. Changes made at last visit include starting Torsemide and Metolazone. Was subsequently discontinued due to worsening kidney functions. Has since had follow up with  He reports good compliance with treatment. He feels that condition is Improved. Patient has been using compression socks.  He is not having side effects.   ------------------------------------------------------------------------------------   Hypertension, follow-up:  BP Readings from Last 3 Encounters:  05/23/18 (!) 172/79  03/20/18 (!) 164/80  02/20/18 (!) 160/76    He was last seen for hypertension 8 weeks ago.  BP at that visit was 164/80. Management since that visit includes no changes. He reports good compliance with treatment. He is not having side effects.  He is not exercising. He is adherent to low salt diet.   Outside blood pressures are not being checked. He is experiencing none.  Patient denies chest pain, chest pressure/discomfort, claudication, dyspnea, exertional chest pressure/discomfort, fatigue, irregular heart beat, lower extremity edema, near-syncope, orthopnea, palpitations, paroxysmal nocturnal dyspnea, syncope and tachypnea.   Cardiovascular risk factors include advanced age (older than 51 for men, 39 for women), hypertension and male gender.  Use of agents associated with hypertension: NSAIDS.     Weight trend: fluctuating a bit Wt Readings from Last 3 Encounters:  05/23/18 (!) 303 lb (137.4 kg)  03/20/18 (!) 305 lb (138.3 kg)  02/20/18 (!) 314 lb (142.4 kg)    Current diet: in general, a "healthy" diet     ------------------------------------------------------------------------  Follow up for Acute Kidney Injury:  The patient was last seen for this 8 weeks ago. Changes made at last visit include none.  He reports good compliance with treatment. He feels that condition is stable. He is not having side effects.   ------------------------------------------------------------------------------------   Allergies  Allergen Reactions  . Mucinex  [Guaifenesin Er]     Throat swelling, increases heart rate  . Levaquin [Levofloxacin] Palpitations     Current Outpatient Medications:  .  albuterol (PROAIR HFA) 108 (90 Base) MCG/ACT inhaler, USE 2 INHALATIONS EVERY 4 HOURS AS NEEDED FOR WHEEZING OR SHORTNESS OF BREATH, Disp: 8.5 g, Rfl: 5 .  amitriptyline (ELAVIL) 25 MG tablet, Take 1 tablet (25 mg total) by mouth at bedtime., Disp: 90 tablet, Rfl: 3 .  amLODipine (NORVASC) 10 MG tablet, TAKE 1 TABLET DAILY, Disp: 90 tablet, Rfl: 4 .  Ascorbic Acid (VITAMIN C) 1000 MG tablet, Take 1,000 mg by mouth 2 (two) times daily. , Disp: , Rfl:  .  aspirin 81 MG tablet, Take 81 mg by mouth daily., Disp: , Rfl:  .  atorvastatin (LIPITOR) 40 MG tablet, Take 1 tablet (40 mg total) by mouth daily., Disp: 90 tablet, Rfl: 4 .  Calcium Carbonate (CALCIUM 600 PO), Take 1 tablet by mouth daily., Disp: , Rfl:  .  cetirizine (ZYRTEC) 10 MG tablet, Take 10 mg by mouth daily. , Disp: , Rfl:  .  cholecalciferol (VITAMIN D) 1000 units tablet, Take 1,000 mcg by mouth daily. , Disp: , Rfl:  .  Cyanocobalamin (B-12 PO), Take 1,000 mcg by mouth daily. , Disp: , Rfl:  .  insulin regular human  CONCENTRATED (HUMULIN R) 500 UNIT/ML injection, Take 20 U in morning and 30 U in evening, IF blood sugar is > 100 ( Check blood sugar before taking injection), If sugar level < 100- DO NOT take any insuline., Disp: 60 mL, Rfl: 5 .  levothyroxine (SYNTHROID, LEVOTHROID) 100 MCG tablet, TAKE ONE TABLET BY MOUTH ONCE DAILY, Disp: 30 tablet,  Rfl: 12 .  metolazone (ZAROXOLYN) 2.5 MG tablet, Take 1 tablet (2.5 mg total) by mouth daily., Disp: 30 tablet, Rfl: 2 .  metoprolol tartrate (LOPRESSOR) 50 MG tablet, TAKE 1 TABLET TWICE A DAY, Disp: 180 tablet, Rfl: 4 .  pantoprazole (PROTONIX) 40 MG tablet, Take 1 tablet (40 mg total) by mouth 2 (two) times daily., Disp: 180 tablet, Rfl: 4 .  tadalafil (CIALIS) 5 MG tablet, Take 1 tablet (5 mg total) by mouth daily as needed for erectile dysfunction., Disp: 30 tablet, Rfl: 5 .  testosterone cypionate (DEPO-TESTOSTERONE) 200 MG/ML injection, Inject 1 mL (200 mg total) into the muscle every 14 (fourteen) days., Disp: 10 mL, Rfl: 1 .  Tiotropium Bromide-Olodaterol (STIOLTO RESPIMAT) 2.5-2.5 MCG/ACT AERS, Inhale 2 puffs into the lungs daily., Disp: 1 Inhaler, Rfl: 0 .  torsemide (DEMADEX) 20 MG tablet, Take 1 tablet (20 mg total) by mouth daily. (Patient taking differently: Take 20 mg by mouth 2 (two) times daily. ), Disp: 30 tablet, Rfl: 1 .  triamcinolone lotion (KENALOG) 0.1 %, Apply 1 application topically 3 (three) times daily., Disp: 60 mL, Rfl: 0 .  ULORIC 80 MG TABS, Take 80 mg by mouth daily. , Disp: , Rfl:   Review of Systems  Constitutional: Negative for appetite change, chills and fever.  Respiratory: Positive for shortness of breath (with exertion). Negative for chest tightness and wheezing.   Cardiovascular: Negative for chest pain and palpitations.  Gastrointestinal: Negative for abdominal pain, nausea and vomiting.  Musculoskeletal: Positive for myalgias (left leg pain).    Social History   Tobacco Use  . Smoking status: Former Smoker    Packs/day: 1.00    Years: 15.00    Pack years: 15.00    Types: Cigarettes    Last attempt to quit: 05/03/1978    Years since quitting: 40.0  . Smokeless tobacco: Former Systems developer  . Tobacco comment: started smoking at age 65  Substance Use Topics  . Alcohol use: No    Alcohol/week: 0.0 standard drinks      Objective:   BP (!) 172/79 (BP  Location: Left Arm, Cuff Size: Large)   Pulse 65   Temp 98.9 F (37.2 C) (Oral)   Resp 18   Wt (!) 303 lb (137.4 kg)   SpO2 94% Comment: room air  BMI 42.26 kg/m  Vitals:   05/23/18 0815 05/23/18 0818  BP: (!) 173/87 (!) 172/79  Pulse: 65   Resp: 18   Temp: 98.9 F (37.2 C)   TempSrc: Oral   SpO2: 94%   Weight: (!) 303 lb (137.4 kg)      Physical Exam   General Appearance:    Alert, cooperative, no distress  Eyes:    PERRL, conjunctiva/corneas clear, EOM's intact       Lungs:     Clear to auscultation bilaterally, respirations unlabored  Heart:    Regular rate and rhythm, 2+ bipedal edema.   Neurologic:   Awake, alert, oriented x 3. No apparent focal neurological           defect.           Assessment &  Plan     1. Hypertension, unspecified type Uncontrolled. Not candidate for spironolactone due to history of several hyperkalemia. Will try adding- hydrALAZINE (APRESOLINE) 10 MG tablet; Take one tablet three times daily for 7 days, then increase to two tablets three times daily.  Dispense: 180 tablet; Refill: 0 Consider cardiology referral for resistant hypertension.   2. Diastolic dysfunction Continue torsemide daily and may take a second daily torsemide only as needed.   Future Appointments  Date Time Provider [place]  06/20/2018  8:00 AM [place], Kirstie Peri, MD [facility] None       Lelon Huh, MD  [facility]

## 2018-06-02 ENCOUNTER — Other Ambulatory Visit: Payer: Self-pay | Admitting: Family Medicine

## 2018-06-09 ENCOUNTER — Encounter: Payer: Self-pay | Admitting: Family Medicine

## 2018-06-09 ENCOUNTER — Ambulatory Visit: Payer: Commercial Managed Care - PPO | Admitting: Family Medicine

## 2018-06-09 VITALS — BP 136/70 | HR 72 | Temp 98.3°F | Wt 311.0 lb

## 2018-06-09 DIAGNOSIS — N529 Male erectile dysfunction, unspecified: Secondary | ICD-10-CM | POA: Diagnosis not present

## 2018-06-09 DIAGNOSIS — R609 Edema, unspecified: Secondary | ICD-10-CM

## 2018-06-09 DIAGNOSIS — R0602 Shortness of breath: Secondary | ICD-10-CM | POA: Diagnosis not present

## 2018-06-09 DIAGNOSIS — I5189 Other ill-defined heart diseases: Secondary | ICD-10-CM | POA: Diagnosis not present

## 2018-06-09 MED ORDER — TORSEMIDE 20 MG PO TABS: 20.0000 mg | ORAL_TABLET | Freq: Every day | ORAL | 1 refills | Status: DC

## 2018-06-09 MED ORDER — TADALAFIL 10 MG PO TABS: 10.0000 mg | ORAL_TABLET | Freq: Every day | ORAL | 1 refills | Status: DC | PRN

## 2018-06-09 NOTE — Progress Notes (Signed)
Patient: Patrick Hurst Male    DOB: [birth year]   63 y.o.   MRN: [id number] Visit Date: 06/09/2018  Today's Provider: Lelon Huh, MD   Chief Complaint  Patient presents with  . Sinusitis  . Shortness of Breath   Subjective:    Sinusitis  This is a new problem. The current episode started 1 to 4 weeks ago. The problem has been gradually worsening since onset. Associated symptoms include congestion, coughing and shortness of breath. Pertinent negatives include no chills, diaphoresis, ear pain, headaches, sinus pressure, sneezing or sore throat. Past treatments include nothing. The treatment provided no relief.   Shortness of Breath  This is a new problem. The current episode started 1 to 4 weeks ago. Associated symptoms include leg swelling (Compression socks help). Pertinent negatives include no chest pain, ear pain, fever, headaches, hemoptysis, rash, rhinorrhea, sore throat, sputum production, syncope, vomiting or wheezing. The symptoms are aggravated by exercise (Walking). He has tried nothing for the symptoms.   Wt Readings from Last 5 Encounters:  06/09/18 (!) 311 lb (141.1 kg)  05/23/18 (!) 303 lb (137.4 kg)  03/20/18 (!) 305 lb (138.3 kg)  02/20/18 (!) 314 lb (142.4 kg)  11/19/17 299 lb (135.6 kg)      Allergies  Allergen Reactions  . Mucinex  [Guaifenesin Er]     Throat swelling, increases heart rate  . Levaquin [Levofloxacin] Palpitations     Current Outpatient Medications:  .  albuterol (PROAIR HFA) 108 (90 Base) MCG/ACT inhaler, USE 2 INHALATIONS EVERY 4 HOURS AS NEEDED FOR WHEEZING OR SHORTNESS OF BREATH, Disp: 8.5 g, Rfl: 5 .  amitriptyline (ELAVIL) 25 MG tablet, Take 1 tablet (25 mg total) by mouth at bedtime., Disp: 90 tablet, Rfl: 3 .  amLODipine (NORVASC) 10 MG tablet, TAKE 1 TABLET DAILY, Disp: 90 tablet, Rfl: 4 .  Ascorbic Acid (VITAMIN C) 1000 MG tablet, Take 1,000 mg by mouth 2 (two) times daily. , Disp: , Rfl:  .  aspirin 81 MG tablet, Take 81  mg by mouth daily., Disp: , Rfl:  .  atorvastatin (LIPITOR) 40 MG tablet, Take 1 tablet (40 mg total) by mouth daily., Disp: 90 tablet, Rfl: 4 .  Calcium Carbonate (CALCIUM 600 PO), Take 1 tablet by mouth daily., Disp: , Rfl:  .  cetirizine (ZYRTEC) 10 MG tablet, Take 10 mg by mouth daily. , Disp: , Rfl:  .  cholecalciferol (VITAMIN D) 1000 units tablet, Take 1,000 mcg by mouth daily. , Disp: , Rfl:  .  Cyanocobalamin (B-12 PO), Take 1,000 mcg by mouth daily. , Disp: , Rfl:  .  hydrALAZINE (APRESOLINE) 10 MG tablet, Take one tablet three times daily for 7 days, then increase to two tablets three times daily., Disp: 180 tablet, Rfl: 0 .  insulin regular human CONCENTRATED (HUMULIN R) 500 UNIT/ML injection, Take 20 U in morning and 30 U in evening, IF blood sugar is > 100 ( Check blood sugar before taking injection), If sugar level < 100- DO NOT take any insuline., Disp: 60 mL, Rfl: 5 .  levothyroxine (SYNTHROID, LEVOTHROID) 100 MCG tablet, TAKE 1 TABLET BY MOUTH ONCE DAILY, Disp: 90 tablet, Rfl: 4 .  metoprolol tartrate (LOPRESSOR) 50 MG tablet, TAKE 1 TABLET TWICE A DAY, Disp: 180 tablet, Rfl: 4 .  pantoprazole (PROTONIX) 40 MG tablet, Take 1 tablet (40 mg total) by mouth 2 (two) times daily., Disp: 180 tablet, Rfl: 4 .  tadalafil (CIALIS) 5 MG tablet, Take  1 tablet (5 mg total) by mouth daily as needed for erectile dysfunction., Disp: 30 tablet, Rfl: 5 .  testosterone cypionate (DEPO-TESTOSTERONE) 200 MG/ML injection, Inject 1 mL (200 mg total) into the muscle every 14 (fourteen) days., Disp: 10 mL, Rfl: 1 .  torsemide (DEMADEX) 20 MG tablet, Take 1 tablet (20 mg total) by mouth daily., Disp: 30 tablet, Rfl: 1 .  triamcinolone lotion (KENALOG) 0.1 %, Apply 1 application topically 3 (three) times daily., Disp: 60 mL, Rfl: 0 .  ULORIC 80 MG TABS, Take 80 mg by mouth daily. , Disp: , Rfl:  .  Tiotropium Bromide-Olodaterol (STIOLTO RESPIMAT) 2.5-2.5 MCG/ACT AERS, Inhale 2 puffs into the lungs daily.  (Patient not taking: Reported on 06/09/2018), Disp: 1 Inhaler, Rfl: 0  Review of Systems  Constitutional: Positive for fatigue and unexpected weight change (Has had some weight gain.). Negative for activity change, appetite change, chills, diaphoresis and fever.  HENT: Positive for congestion, postnasal drip and voice change. Negative for ear discharge, ear pain, rhinorrhea, sinus pressure, sinus pain, sneezing, sore throat, tinnitus and trouble swallowing.   Eyes: Negative.   Respiratory: Positive for cough and shortness of breath. Negative for apnea, hemoptysis, sputum production, choking, chest tightness, wheezing and stridor.   Cardiovascular: Positive for leg swelling (Compression socks help). Negative for chest pain, palpitations and syncope.  Gastrointestinal: Negative.  Negative for vomiting.  Skin: Negative for rash.  Neurological: Negative for dizziness, light-headedness and headaches.    Social History   Tobacco Use  . Smoking status: Former Smoker    Packs/day: 1.00    Years: 15.00    Pack years: 15.00    Types: Cigarettes    Last attempt to quit: 05/03/1978    Years since quitting: 40.1  . Smokeless tobacco: Former Systems developer  . Tobacco comment: started smoking at age 12  Substance Use Topics  . Alcohol use: No    Alcohol/week: 0.0 standard drinks      Objective:   BP 136/70 (BP Location: Right Arm, Patient Position: Sitting, Cuff Size: Large)   Pulse 72   Temp 98.3 F (36.8 C) (Oral)   Wt (!) 311 lb (141.1 kg)   SpO2 92%   BMI 43.38 kg/m  Vitals:   06/09/18 1538  BP: 136/70  Pulse: 72  Temp: 98.3 F (36.8 C)  TempSrc: Oral  SpO2: 92%  Weight: (!) 311 lb (141.1 kg)     Physical Exam   General Appearance:    Alert, cooperative, no distress  Eyes:    PERRL, conjunctiva/corneas clear, EOM's intact       Lungs:     Clear to auscultation bilaterally, respirations unlabored, diminished breath sounds  Heart:    Regular rate and rhythm  Neurologic:   Awake,  alert, oriented x 3. No apparent focal neurological           defect.      Spirometry  Best FVC=63%ile   Best FEV1=58%ile  Best FEV1/FVC= 93%ile     Assessment & Plan    1. Shortness of breath Low lung volume on spirometry, but normal FEV1/FVC. 8 pound weight gain noted in last 17 days. Likely fluid accumulations. Counseled on the need to diurese cautiously due to severe CKD.  - Spirometry with graph  2. Diastolic dysfunction Not candidate for spironolactone due to hyperkalemia and had significant worsening of renal function when metolazone was added. Anticipated some benefit from better BP control since starting hydralazine. Will send in prescription for 25mg  TID before current  10mg  tablets run out.   3. Edema, unspecified type Add extra dose of torsemide for the next several days as per patient instructions.   He also requests increase in dose of- tadalafil (CIALIS)  To 10 MG tablet; Take 1 tablet (10 mg total) by mouth daily as needed.  Dispense: 30 tablet; Refill: 1  Future Appointments  Date Time Provider [place]  07/10/2018  8:00 AM Leighanne Adolph, Kirstie Peri, MD [facility] None      Lelon Huh, MD  [facility]

## 2018-06-09 NOTE — Patient Instructions (Addendum)
.   Your current bottle of hydralazine should run out in about 2 weeks. I'll send in a new prescription for 25mg  tablets before it runs out. You will only have to take ONE tablet three times a day when you get the new prescription.    Increase torsemide to TWO tablets daily for the next 5-7 days, then go back to one tablet daily  . Then weigh yourself daily. Take a second torsemide tablet if you gain >2 pounds in 1 day or >5 pounds in 1 week

## 2018-06-15 DIAGNOSIS — N183 Chronic kidney disease, stage 3 (moderate): Secondary | ICD-10-CM | POA: Diagnosis not present

## 2018-06-15 DIAGNOSIS — N179 Acute kidney failure, unspecified: Secondary | ICD-10-CM | POA: Diagnosis not present

## 2018-06-15 DIAGNOSIS — I129 Hypertensive chronic kidney disease with stage 1 through stage 4 chronic kidney disease, or unspecified chronic kidney disease: Secondary | ICD-10-CM | POA: Diagnosis not present

## 2018-06-15 DIAGNOSIS — E1129 Type 2 diabetes mellitus with other diabetic kidney complication: Secondary | ICD-10-CM | POA: Diagnosis not present

## 2018-06-18 ENCOUNTER — Other Ambulatory Visit: Payer: Self-pay | Admitting: Family Medicine

## 2018-06-18 DIAGNOSIS — I10 Essential (primary) hypertension: Secondary | ICD-10-CM

## 2018-06-20 ENCOUNTER — Ambulatory Visit: Payer: Self-pay | Admitting: Family Medicine

## 2018-06-20 ENCOUNTER — Encounter: Payer: Self-pay | Admitting: Family Medicine

## 2018-06-20 ENCOUNTER — Telehealth: Payer: Self-pay | Admitting: Family Medicine

## 2018-06-20 DIAGNOSIS — N2581 Secondary hyperparathyroidism of renal origin: Secondary | ICD-10-CM | POA: Insufficient documentation

## 2018-06-20 NOTE — Telephone Encounter (Signed)
Do you want to work in him today, or should I advise him to go to the Urgent Care?   Thanks,   -[name]

## 2018-06-20 NOTE — Telephone Encounter (Signed)
Called saying he has had a cough since yesterday and it is mixed with blood sputum.  He said he was just in last week or week before for SOB.    His call back is [phone]  There are no appts left today  Thanks teri

## 2018-06-20 NOTE — Telephone Encounter (Signed)
Pt states his cough stated yesterday and occasionally it is mixed with bloody sputum.  He states his shortness of breath is about the same as it was last week.  Pt denies fevers, chills.  I advised pt he should be seen at an Urgent Care.  Pt refused and said that "They won't treat him right there."    I scheduled an appointment with you tomorrow at 8am.  I advised the pt that if his symptoms worsening or if he develops new symptoms he will have to go to the ER to be evaluated.  He agreed.     Thanks,   -[name]

## 2018-06-21 ENCOUNTER — Emergency Department: Payer: Commercial Managed Care - PPO

## 2018-06-21 ENCOUNTER — Encounter: Payer: Self-pay | Admitting: Emergency Medicine

## 2018-06-21 ENCOUNTER — Inpatient Hospital Stay
Admission: EM | Admit: 2018-06-21 | Discharge: 2018-06-26 | DRG: 291 | Disposition: A | Discharge status: Home / Self Care | Payer: Commercial Managed Care - PPO | Attending: Internal Medicine | Admitting: Internal Medicine

## 2018-06-21 ENCOUNTER — Ambulatory Visit: Payer: Commercial Managed Care - PPO | Admitting: Family Medicine

## 2018-06-21 ENCOUNTER — Other Ambulatory Visit: Payer: Self-pay

## 2018-06-21 DIAGNOSIS — I509 Heart failure, unspecified: Secondary | ICD-10-CM | POA: Diagnosis not present

## 2018-06-21 DIAGNOSIS — Z87891 Personal history of nicotine dependence: Secondary | ICD-10-CM

## 2018-06-21 DIAGNOSIS — J9621 Acute and chronic respiratory failure with hypoxia: Secondary | ICD-10-CM | POA: Diagnosis not present

## 2018-06-21 DIAGNOSIS — J189 Pneumonia, unspecified organism: Secondary | ICD-10-CM | POA: Diagnosis not present

## 2018-06-21 DIAGNOSIS — K760 Fatty (change of) liver, not elsewhere classified: Secondary | ICD-10-CM | POA: Diagnosis present

## 2018-06-21 DIAGNOSIS — R042 Hemoptysis: Secondary | ICD-10-CM | POA: Diagnosis not present

## 2018-06-21 DIAGNOSIS — R600 Localized edema: Secondary | ICD-10-CM | POA: Diagnosis not present

## 2018-06-21 DIAGNOSIS — I5033 Acute on chronic diastolic (congestive) heart failure: Secondary | ICD-10-CM

## 2018-06-21 DIAGNOSIS — E1140 Type 2 diabetes mellitus with diabetic neuropathy, unspecified: Secondary | ICD-10-CM | POA: Diagnosis present

## 2018-06-21 DIAGNOSIS — Z888 Allergy status to other drugs, medicaments and biological substances status: Secondary | ICD-10-CM | POA: Diagnosis not present

## 2018-06-21 DIAGNOSIS — Z9049 Acquired absence of other specified parts of digestive tract: Secondary | ICD-10-CM | POA: Diagnosis not present

## 2018-06-21 DIAGNOSIS — E662 Morbid (severe) obesity with alveolar hypoventilation: Secondary | ICD-10-CM | POA: Diagnosis present

## 2018-06-21 DIAGNOSIS — Z881 Allergy status to other antibiotic agents status: Secondary | ICD-10-CM | POA: Diagnosis not present

## 2018-06-21 DIAGNOSIS — E039 Hypothyroidism, unspecified: Secondary | ICD-10-CM | POA: Diagnosis present

## 2018-06-21 DIAGNOSIS — Z79890 Hormone replacement therapy (postmenopausal): Secondary | ICD-10-CM

## 2018-06-21 DIAGNOSIS — E559 Vitamin D deficiency, unspecified: Secondary | ICD-10-CM | POA: Diagnosis present

## 2018-06-21 DIAGNOSIS — G4733 Obstructive sleep apnea (adult) (pediatric): Secondary | ICD-10-CM

## 2018-06-21 DIAGNOSIS — R0989 Other specified symptoms and signs involving the circulatory and respiratory systems: Secondary | ICD-10-CM | POA: Diagnosis not present

## 2018-06-21 DIAGNOSIS — I130 Hypertensive heart and chronic kidney disease with heart failure and stage 1 through stage 4 chronic kidney disease, or unspecified chronic kidney disease: Principal | ICD-10-CM | POA: Diagnosis present

## 2018-06-21 DIAGNOSIS — E785 Hyperlipidemia, unspecified: Secondary | ICD-10-CM | POA: Diagnosis present

## 2018-06-21 DIAGNOSIS — K7460 Unspecified cirrhosis of liver: Secondary | ICD-10-CM | POA: Diagnosis present

## 2018-06-21 DIAGNOSIS — R0602 Shortness of breath: Secondary | ICD-10-CM | POA: Diagnosis not present

## 2018-06-21 DIAGNOSIS — R0902 Hypoxemia: Secondary | ICD-10-CM

## 2018-06-21 DIAGNOSIS — Z9841 Cataract extraction status, right eye: Secondary | ICD-10-CM | POA: Diagnosis not present

## 2018-06-21 DIAGNOSIS — F329 Major depressive disorder, single episode, unspecified: Secondary | ICD-10-CM | POA: Diagnosis present

## 2018-06-21 DIAGNOSIS — N184 Chronic kidney disease, stage 4 (severe): Secondary | ICD-10-CM | POA: Diagnosis present

## 2018-06-21 DIAGNOSIS — Z9842 Cataract extraction status, left eye: Secondary | ICD-10-CM | POA: Diagnosis not present

## 2018-06-21 DIAGNOSIS — Z825 Family history of asthma and other chronic lower respiratory diseases: Secondary | ICD-10-CM

## 2018-06-21 DIAGNOSIS — J181 Lobar pneumonia, unspecified organism: Secondary | ICD-10-CM | POA: Diagnosis not present

## 2018-06-21 DIAGNOSIS — J9601 Acute respiratory failure with hypoxia: Secondary | ICD-10-CM | POA: Diagnosis present

## 2018-06-21 DIAGNOSIS — Z6841 Body Mass Index (BMI) 40.0 and over, adult: Secondary | ICD-10-CM

## 2018-06-21 DIAGNOSIS — Z8249 Family history of ischemic heart disease and other diseases of the circulatory system: Secondary | ICD-10-CM

## 2018-06-21 DIAGNOSIS — Z7982 Long term (current) use of aspirin: Secondary | ICD-10-CM

## 2018-06-21 DIAGNOSIS — E1165 Type 2 diabetes mellitus with hyperglycemia: Secondary | ICD-10-CM | POA: Diagnosis not present

## 2018-06-21 DIAGNOSIS — Z79899 Other long term (current) drug therapy: Secondary | ICD-10-CM

## 2018-06-21 DIAGNOSIS — I5043 Acute on chronic combined systolic (congestive) and diastolic (congestive) heart failure: Secondary | ICD-10-CM | POA: Diagnosis not present

## 2018-06-21 DIAGNOSIS — M109 Gout, unspecified: Secondary | ICD-10-CM | POA: Diagnosis present

## 2018-06-21 DIAGNOSIS — J9690 Respiratory failure, unspecified, unspecified whether with hypoxia or hypercapnia: Secondary | ICD-10-CM | POA: Diagnosis not present

## 2018-06-21 DIAGNOSIS — I10 Essential (primary) hypertension: Secondary | ICD-10-CM | POA: Diagnosis present

## 2018-06-21 DIAGNOSIS — I700 Atherosclerosis of aorta: Secondary | ICD-10-CM | POA: Diagnosis present

## 2018-06-21 DIAGNOSIS — K219 Gastro-esophageal reflux disease without esophagitis: Secondary | ICD-10-CM | POA: Diagnosis present

## 2018-06-21 DIAGNOSIS — E1122 Type 2 diabetes mellitus with diabetic chronic kidney disease: Secondary | ICD-10-CM | POA: Diagnosis present

## 2018-06-21 DIAGNOSIS — N2581 Secondary hyperparathyroidism of renal origin: Secondary | ICD-10-CM | POA: Diagnosis present

## 2018-06-21 DIAGNOSIS — Z794 Long term (current) use of insulin: Secondary | ICD-10-CM

## 2018-06-21 DIAGNOSIS — I110 Hypertensive heart disease with heart failure: Secondary | ICD-10-CM | POA: Diagnosis not present

## 2018-06-21 HISTORY — DX: Chronic kidney disease, unspecified: N18.9

## 2018-06-21 HISTORY — DX: Acute on chronic diastolic (congestive) heart failure: I50.33

## 2018-06-21 HISTORY — DX: Acute respiratory failure with hypoxia: J96.01

## 2018-06-21 LAB — GLUCOSE, CAPILLARY
GLUCOSE-CAPILLARY: 170 mg/dL — AB (ref 70–99)
Glucose-Capillary: 155 mg/dL — ABNORMAL HIGH (ref 70–99)
Glucose-Capillary: 219 mg/dL — ABNORMAL HIGH (ref 70–99)
Glucose-Capillary: 182 mg/dL — ABNORMAL HIGH (ref 70–99)

## 2018-06-21 LAB — CBC
HCT: 28.5 % — ABNORMAL LOW (ref 39.0–52.0)
HCT: 27.4 % — ABNORMAL LOW (ref 39.0–52.0)
Hemoglobin: 9 g/dL — ABNORMAL LOW (ref 13.0–17.0)
Hemoglobin: 8.8 g/dL — ABNORMAL LOW (ref 13.0–17.0)
MCH: 28.5 pg (ref 26.0–34.0)
MCH: 28.3 pg (ref 26.0–34.0)
MCHC: 31.6 g/dL (ref 30.0–36.0)
MCHC: 32.1 g/dL (ref 30.0–36.0)
MCV: 90.2 fL (ref 80.0–100.0)
MCV: 88.1 fL (ref 80.0–100.0)
Platelets: 116 10*3/uL — ABNORMAL LOW (ref 150–400)
Platelets: 112 10*3/uL — ABNORMAL LOW (ref 150–400)
RBC: 3.16 MIL/uL — ABNORMAL LOW (ref 4.22–5.81)
RBC: 3.11 MIL/uL — ABNORMAL LOW (ref 4.22–5.81)
RDW: 15.5 % (ref 11.5–15.5)
RDW: 15.5 % (ref 11.5–15.5)
WBC: 8.1 10*3/uL (ref 4.0–10.5)
WBC: 7.6 10*3/uL (ref 4.0–10.5)
nRBC: 0 % (ref 0.0–0.2)
nRBC: 0 % (ref 0.0–0.2)

## 2018-06-21 LAB — BASIC METABOLIC PANEL
ANION GAP: 8 (ref 5–15)
Anion gap: 9 (ref 5–15)
BUN: 49 mg/dL — ABNORMAL HIGH (ref 8–23)
BUN: 50 mg/dL — ABNORMAL HIGH (ref 8–23)
CO2: 23 mmol/L (ref 22–32)
CO2: 24 mmol/L (ref 22–32)
Calcium: 8 mg/dL — ABNORMAL LOW (ref 8.9–10.3)
Calcium: 7.9 mg/dL — ABNORMAL LOW (ref 8.9–10.3)
Chloride: 107 mmol/L (ref 98–111)
Chloride: 106 mmol/L (ref 98–111)
Creatinine, Ser: 3.45 mg/dL — ABNORMAL HIGH (ref 0.61–1.24)
Creatinine, Ser: 3.23 mg/dL — ABNORMAL HIGH (ref 0.61–1.24)
GFR calc Af Amer: 21 mL/min — ABNORMAL LOW (ref >60)
GFR calc Af Amer: 23 mL/min — ABNORMAL LOW (ref >60)
GFR calc non Af Amer: 18 mL/min — ABNORMAL LOW (ref >60)
GFR calc non Af Amer: 20 mL/min — ABNORMAL LOW (ref >60)
Glucose, Bld: 209 mg/dL — ABNORMAL HIGH (ref 70–99)
Glucose, Bld: 189 mg/dL — ABNORMAL HIGH (ref 70–99)
Potassium: 4.2 mmol/L (ref 3.5–5.1)
Potassium: 4.1 mmol/L (ref 3.5–5.1)
Sodium: 139 mmol/L (ref 135–145)
Sodium: 138 mmol/L (ref 135–145)

## 2018-06-21 LAB — INFLUENZA PANEL BY PCR (TYPE A & B)
Influenza A By PCR: NEGATIVE
Influenza B By PCR: NEGATIVE

## 2018-06-21 LAB — BLOOD GAS, VENOUS
Acid-base deficit: 1.1 mmol/L (ref 0.0–2.0)
Bicarbonate: 24.8 mmol/L (ref 20.0–28.0)
O2 Saturation: 70.4 %
PATIENT TEMPERATURE: 37
pCO2, Ven: 45 mmHg (ref 44.0–60.0)
pH, Ven: 7.35 (ref 7.250–7.430)
pO2, Ven: 39 mmHg (ref 32.0–45.0)

## 2018-06-21 LAB — MRSA PCR SCREENING: MRSA BY PCR: NEGATIVE

## 2018-06-21 LAB — TROPONIN I
TROPONIN I: 0.14 ng/mL — AB (ref <0.03)
TROPONIN I: 2.04 ng/mL — AB (ref <0.03)
Troponin I: 1.91 ng/mL (ref <0.03)

## 2018-06-21 LAB — BRAIN NATRIURETIC PEPTIDE: B Natriuretic Peptide: 244 pg/mL — ABNORMAL HIGH (ref 0.0–100.0)

## 2018-06-21 MED ORDER — INSULIN ASPART 100 UNIT/ML ~~LOC~~ SOLN
0.0000 [IU] | Freq: Three times a day (TID) | SUBCUTANEOUS | Status: DC
  Administered 2018-06-21: 3 [IU] via SUBCUTANEOUS
  Administered 2018-06-21 – 2018-06-22 (×3): 2 [IU] via SUBCUTANEOUS
  Administered 2018-06-22: 1 [IU] via SUBCUTANEOUS
  Administered 2018-06-22: 3 [IU] via SUBCUTANEOUS
  Administered 2018-06-23 (×2): 2 [IU] via SUBCUTANEOUS
  Administered 2018-06-23: 3 [IU] via SUBCUTANEOUS
  Administered 2018-06-24: 2 [IU] via SUBCUTANEOUS
  Administered 2018-06-24: 3 [IU] via SUBCUTANEOUS
  Administered 2018-06-24 – 2018-06-25 (×3): 2 [IU] via SUBCUTANEOUS
  Administered 2018-06-25: 1 [IU] via SUBCUTANEOUS
  Administered 2018-06-26 (×2): 2 [IU] via SUBCUTANEOUS
  Filled 2018-06-21 (×16): qty 1

## 2018-06-21 MED ORDER — SODIUM CHLORIDE 0.9 % IV SOLN: 1.0000 g | INTRAVENOUS | Status: DC

## 2018-06-21 MED ORDER — PANTOPRAZOLE SODIUM 40 MG PO TBEC
40.0000 mg | DELAYED_RELEASE_TABLET | Freq: Two times a day (BID) | ORAL | Status: DC
  Administered 2018-06-21 – 2018-06-26 (×11): 40 mg via ORAL
  Filled 2018-06-21 (×11): qty 1

## 2018-06-21 MED ORDER — TORSEMIDE 20 MG PO TABS
20.0000 mg | ORAL_TABLET | Freq: Every day | ORAL | Status: DC
  Administered 2018-06-21 – 2018-06-23 (×3): 20 mg via ORAL
  Filled 2018-06-21 (×3): qty 1

## 2018-06-21 MED ORDER — IPRATROPIUM-ALBUTEROL 0.5-2.5 (3) MG/3ML IN SOLN: 3.0000 mL | RESPIRATORY_TRACT | Status: DC | PRN

## 2018-06-21 MED ORDER — FUROSEMIDE 10 MG/ML IJ SOLN
60.0000 mg | Freq: Once | INTRAMUSCULAR | Status: AC
  Administered 2018-06-21: 60 mg via INTRAVENOUS
  Filled 2018-06-21: qty 8

## 2018-06-21 MED ORDER — ONDANSETRON HCL 4 MG PO TABS: 4.0000 mg | ORAL_TABLET | Freq: Four times a day (QID) | ORAL | Status: DC | PRN

## 2018-06-21 MED ORDER — SODIUM CHLORIDE 0.9 % IV SOLN
500.0000 mg | Freq: Once | INTRAVENOUS | Status: AC
  Administered 2018-06-21: 500 mg via INTRAVENOUS
  Filled 2018-06-21: qty 500

## 2018-06-21 MED ORDER — SODIUM CHLORIDE 0.9 % IV SOLN
1.0000 g | INTRAVENOUS | Status: DC
  Filled 2018-06-21: qty 10

## 2018-06-21 MED ORDER — ACETAMINOPHEN 325 MG PO TABS: 650.0000 mg | ORAL_TABLET | Freq: Four times a day (QID) | ORAL | Status: DC | PRN

## 2018-06-21 MED ORDER — INSULIN ASPART 100 UNIT/ML ~~LOC~~ SOLN
0.0000 [IU] | Freq: Every day | SUBCUTANEOUS | Status: DC
  Administered 2018-06-22 – 2018-06-24 (×2): 2 [IU] via SUBCUTANEOUS
  Administered 2018-06-25: 3 [IU] via SUBCUTANEOUS
  Filled 2018-06-21 (×3): qty 1

## 2018-06-21 MED ORDER — HYDRALAZINE HCL 10 MG PO TABS
10.0000 mg | ORAL_TABLET | Freq: Three times a day (TID) | ORAL | Status: DC
  Administered 2018-06-21 – 2018-06-26 (×15): 10 mg via ORAL
  Filled 2018-06-21 (×17): qty 1

## 2018-06-21 MED ORDER — AZITHROMYCIN 500 MG PO TABS
500.0000 mg | ORAL_TABLET | Freq: Every day | ORAL | Status: DC
  Administered 2018-06-22: 500 mg via ORAL
  Filled 2018-06-21: qty 1

## 2018-06-21 MED ORDER — SODIUM CHLORIDE 0.9 % IV SOLN
1.0000 g | Freq: Once | INTRAVENOUS | Status: AC
  Administered 2018-06-21: 1 g via INTRAVENOUS
  Filled 2018-06-21: qty 10

## 2018-06-21 MED ORDER — INSULIN ASPART 100 UNIT/ML ~~LOC~~ SOLN
10.0000 [IU] | Freq: Two times a day (BID) | SUBCUTANEOUS | Status: DC
  Administered 2018-06-21 – 2018-06-26 (×12): 10 [IU] via SUBCUTANEOUS
  Filled 2018-06-21 (×11): qty 1

## 2018-06-21 MED ORDER — IPRATROPIUM-ALBUTEROL 0.5-2.5 (3) MG/3ML IN SOLN
3.0000 mL | Freq: Once | RESPIRATORY_TRACT | Status: AC
  Administered 2018-06-21: 3 mL via RESPIRATORY_TRACT
  Filled 2018-06-21: qty 3

## 2018-06-21 MED ORDER — ONDANSETRON HCL 4 MG/2ML IJ SOLN: 4.0000 mg | Freq: Four times a day (QID) | INTRAMUSCULAR | Status: DC | PRN

## 2018-06-21 MED ORDER — ACETAMINOPHEN 650 MG RE SUPP: 650.0000 mg | Freq: Four times a day (QID) | RECTAL | Status: DC | PRN

## 2018-06-21 MED ORDER — FEBUXOSTAT 40 MG PO TABS
80.0000 mg | ORAL_TABLET | Freq: Every day | ORAL | Status: DC
  Administered 2018-06-21 – 2018-06-26 (×6): 80 mg via ORAL
  Filled 2018-06-21 (×6): qty 2

## 2018-06-21 MED ORDER — HYDROCOD POLST-CPM POLST ER 10-8 MG/5ML PO SUER: 5.0000 mL | Freq: Two times a day (BID) | ORAL | Status: DC | PRN

## 2018-06-21 MED ORDER — HEPARIN SODIUM (PORCINE) 5000 UNIT/ML IJ SOLN
5000.0000 [IU] | Freq: Three times a day (TID) | INTRAMUSCULAR | Status: DC
  Administered 2018-06-21 – 2018-06-26 (×15): 5000 [IU] via SUBCUTANEOUS
  Filled 2018-06-21 (×15): qty 1

## 2018-06-21 MED ORDER — AMLODIPINE BESYLATE 10 MG PO TABS
10.0000 mg | ORAL_TABLET | Freq: Every day | ORAL | Status: DC
  Administered 2018-06-21 – 2018-06-26 (×6): 10 mg via ORAL
  Filled 2018-06-21: qty 2
  Filled 2018-06-21 (×5): qty 1

## 2018-06-21 MED ORDER — ASPIRIN 81 MG PO CHEW
81.0000 mg | CHEWABLE_TABLET | Freq: Every day | ORAL | Status: DC
  Administered 2018-06-21 – 2018-06-26 (×6): 81 mg via ORAL
  Filled 2018-06-21 (×6): qty 1

## 2018-06-21 MED ORDER — ATORVASTATIN CALCIUM 20 MG PO TABS
40.0000 mg | ORAL_TABLET | Freq: Every day | ORAL | Status: DC
  Administered 2018-06-21 – 2018-06-26 (×6): 40 mg via ORAL
  Filled 2018-06-21 (×6): qty 2

## 2018-06-21 MED ORDER — LEVOTHYROXINE SODIUM 100 MCG PO TABS
100.0000 ug | ORAL_TABLET | Freq: Every day | ORAL | Status: DC
  Administered 2018-06-21 – 2018-06-26 (×6): 100 ug via ORAL
  Filled 2018-06-21 (×3): qty 1
  Filled 2018-06-21: qty 2
  Filled 2018-06-21 (×2): qty 1

## 2018-06-21 MED ORDER — BENZONATATE 100 MG PO CAPS: 200.0000 mg | ORAL_CAPSULE | Freq: Three times a day (TID) | ORAL | Status: DC | PRN

## 2018-06-21 MED ORDER — METOPROLOL TARTRATE 50 MG PO TABS
50.0000 mg | ORAL_TABLET | Freq: Two times a day (BID) | ORAL | Status: DC
  Administered 2018-06-21 – 2018-06-26 (×11): 50 mg via ORAL
  Filled 2018-06-21 (×11): qty 1

## 2018-06-21 NOTE — ED Notes (Signed)
ED Provider at bedside. 

## 2018-06-21 NOTE — ED Notes (Signed)
Patient transported to X-ray 

## 2018-06-21 NOTE — ED Notes (Signed)
Pt switched back to 40% O2 after pt reports harder to breathe and pt sats stay at 91%.

## 2018-06-21 NOTE — ED Provider Notes (Addendum)
[facility] Emergency Department Provider Note  Time seen: 1:54 AM  I have reviewed the triage vital signs and the nursing notes.   HISTORY  Chief Complaint Shortness of Breath    HPI Patrick Hurst is a 63 y.o. male with a past medical history of anemia, depression, diabetes, gastric reflux, hypertension, hyperlipidemia, OSA wears a CPAP at night, presents to the emergency department for shortness of breath and cough.  According to the patient over the past 2 to 3 weeks he has been feeling progressively more short of breath.  Saw his doctor of this timeframe who doubled his Lasix for 5 days, states no improvement in his shortness of breath.  Patient states shortness of breath has progressively worsened.  Does state increased cough over the past 2 to 3 days and noticed some blood-tinged sputum today.  Currently patient is febrile 99.9, denies any known fever at home.  Patient does not wear oxygen and is currently requiring 5 L to maintain 90 to 91%.  Patient denies any recent chest pain.  Patient does have lower extremity edema which is chronic although denies any significant increase in the edema recently.  Denies any history of COPD or asthma.  Quit smoking 30+ years ago.   Past Medical History:  Diagnosis Date  . Allergy   . Anemia   . Arthritis   . Bell's palsy   . Depression   . Diabetes ([place])   . Gastric ulcer   . GERD (gastroesophageal reflux disease)   . Gout   . History of chicken pox   . Hyperlipidemia   . Hypertension   . Hypothyroidism   . OSA (obstructive sleep apnea)   . Thyroid disease   . Vitamin D deficiency     Patient Active Problem List   Diagnosis Date Noted  . Secondary hyperparathyroidism of renal origin ([place]) 06/20/2018  . Diastolic dysfunction 86/57/8469  . Hyperkalemia 11/19/2017  . Psoriasis (a type of skin inflammation) 10/19/2016  . Bilateral anterior knee pain 04/06/2016  . Primary osteoarthritis of both knees 10/01/2015   . Morbid (severe) obesity due to excess calories ([place]) 10/01/2015  . Hepatomegaly 09/19/2015  . Splenomegaly 09/19/2015  . ANA positive 07/21/2015  . Hepatic fibrosis 04/16/2015  . Charcot ankle 04/02/2015  . Thrombocytopenia ([place]) 02/14/2015  . Allergic rhinitis 02/12/2015  . Bell palsy 02/12/2015  . Carpal tunnel syndrome 02/12/2015  . Clinical depression 02/12/2015  . Diabetes mellitus with neuropathy ([place]) 02/12/2015  . Edema 02/12/2015  . Erectile dysfunction 02/12/2015  . HLD (hyperlipidemia) 02/12/2015  . Hypertension 02/12/2015  . Hypothyroidism 02/12/2015  . Anemia, iron deficiency 02/12/2015  . Low testosterone 02/12/2015  . Neuropathy 02/12/2015  . Obesity 02/12/2015  . Peripheral neuropathy 02/12/2015  . Obstructive sleep apnea 02/12/2015  . Snores 02/12/2015  . Gout 05/05/2012  . Gastric ulcer 11/21/2009    Past Surgical History:  Procedure Laterality Date  . CATARACT EXTRACTION Bilateral 2014 and 2015  . CHOLECYSTECTOMY  04/28/2011   Laproscopic; Dr. Pat Patrick  . GALLBLADDER SURGERY    . LASIK Bilateral 2019   medical  . SHOULDER SURGERY Right 2012   Dr. Leanor Kail    Prior to Admission medications   Medication Sig Start Date End Date Taking? Authorizing Provider  albuterol (PROAIR HFA) 108 (90 Base) MCG/ACT inhaler USE 2 INHALATIONS EVERY 4 HOURS AS NEEDED FOR WHEEZING OR SHORTNESS OF BREATH 11/14/17   [name], MD  amitriptyline (ELAVIL) 25 MG tablet Take 1 tablet (  25 mg total) by mouth at bedtime. 02/14/18   [name], MD  amLODipine (NORVASC) 10 MG tablet TAKE 1 TABLET DAILY 02/07/18   [name], MD  Ascorbic Acid (VITAMIN C) 1000 MG tablet Take 1,000 mg by mouth 2 (two) times daily.     [provider]  aspirin 81 MG tablet Take 81 mg by mouth daily.    [provider]  atorvastatin (LIPITOR) 40 MG tablet Take 1 tablet (40 mg total) by mouth daily. 01/26/18   [name], MD  Calcium Carbonate (CALCIUM 600  PO) Take 1 tablet by mouth daily.    [provider]  cetirizine (ZYRTEC) 10 MG tablet Take 10 mg by mouth daily.     [provider]  cholecalciferol (VITAMIN D) 1000 units tablet Take 1,000 mcg by mouth daily.     [provider]  Cyanocobalamin (B-12 PO) Take 1,000 mcg by mouth daily.     [provider]  hydrALAZINE (APRESOLINE) 10 MG tablet Take one tablet three times daily for 7 days, then increase to two tablets three times daily. 05/23/18   [name], MD  insulin regular human CONCENTRATED (HUMULIN R) 500 UNIT/ML injection Take 20 U in morning and 30 U in evening, IF blood sugar is > 100 ( Check blood sugar before taking injection), If sugar level < 100- DO NOT take any insuline. 11/21/17   Vaughan Basta, MD  levothyroxine (SYNTHROID, LEVOTHROID) 100 MCG tablet TAKE 1 TABLET BY MOUTH ONCE DAILY 06/02/18   [name], MD  metoprolol tartrate (LOPRESSOR) 50 MG tablet TAKE 1 TABLET TWICE A DAY 12/12/17   [name], MD  pantoprazole (PROTONIX) 40 MG tablet Take 1 tablet (40 mg total) by mouth 2 (two) times daily. 11/18/17   [name], MD  tadalafil (CIALIS) 10 MG tablet Take 1 tablet (10 mg total) by mouth daily as needed. 06/09/18   [name], MD  testosterone cypionate (DEPO-TESTOSTERONE) 200 MG/ML injection Inject 1 mL (200 mg total) into the muscle every 14 (fourteen) days. 01/11/18   [name], MD  torsemide (DEMADEX) 20 MG tablet Take 1 tablet (20 mg total) by mouth daily. Take an extra tablet if your weight goes up by 2 pounds overnight, or 5 pounds over a week 06/09/18   [name], MD  triamcinolone lotion (KENALOG) 0.1 % Apply 1 application topically 3 (three) times daily. 06/23/17   [name], MD  ULORIC 80 MG TABS Take 80 mg by mouth daily.  05/18/17   [provider]    Allergies  Allergen Reactions  . Mucinex  [Guaifenesin Er]     Throat swelling, increases heart rate  . Levaquin  [Levofloxacin] Palpitations    Family History  Problem Relation Age of Onset  . COPD Mother   . Heart disease Father     Social History Social History   Tobacco Use  . Smoking status: Former Smoker    Packs/day: 1.00    Years: 15.00    Pack years: 15.00    Types: Cigarettes    Last attempt to quit: 05/03/1978    Years since quitting: 40.1  . Smokeless tobacco: Former Systems developer  . Tobacco comment: started smoking at age 37  Substance Use Topics  . Alcohol use: No    Alcohol/week: 0.0 standard drinks  . Drug use: No    Review of Systems Constitutional: Negative for fever. Cardiovascular: Negative for chest pain. Respiratory:  Positive for shortness of breath.  Positive for cough with blood-tinged sputum at times. Gastrointestinal: Negative for abdominal pain, vomiting Musculoskeletal: Lower extremity mild, chronic. Skin: Negative for skin complaints  Neurological: Negative for headache All other ROS negative  ____________________________________________   PHYSICAL EXAM:  VITAL SIGNS: ED Triage Vitals  Enc Vitals Group     BP 06/21/18 0134 (!) 164/75     Pulse Rate 06/21/18 0134 93     Resp 06/21/18 0134 (!) 27     Temp 06/21/18 0134 99.9 F (37.7 C)     Temp Source 06/21/18 0134 Oral     SpO2 06/21/18 0134 91 %     Weight 06/21/18 0135 300 lb (136.1 kg)     Height 06/21/18 0135 5\' 10"  (1.778 m)     Head Circumference --      Peak Flow --      Pain Score 06/21/18 0135 0     Pain Loc --      Pain Edu? --      Excl. in [place]? --    Constitutional: Alert and oriented. Well appearing and in no distress. Eyes: Normal exam ENT   Head: Normocephalic and atraumatic.   Mouth/Throat: Mucous membranes are moist. Cardiovascular: Normal rate, regular rhythm.  Respiratory: Moderate tachypnea.  Mild expiratory wheeze bilaterally.  No obvious rales or rhonchi.  Equal breath sounds bilaterally. Gastrointestinal: Soft and nontender. No distention.  Obese. Musculoskeletal:  2+ lower extremity my, equal bilaterally.  Nontender to palpation. Neurologic:  Normal speech and language. No gross focal neurologic deficits  Skin:  Skin is warm, dry and intact.  Psychiatric: Mood and affect are normal.   ____________________________________________    EKG  EKG viewed and interpreted by myself shows a normal sinus rhythm at 94 bpm with a narrow QRS, normal axis, largely normal intervals nonspecific ST changes.  No ST elevation.  ____________________________________________    RADIOLOGY  X-ray most consistent with interstitial edema as well as possible left lower lobe pneumonia.  ____________________________________________   INITIAL IMPRESSION / ASSESSMENT AND PLAN / ED COURSE  Pertinent labs & imaging results that were available during my care of the patient were reviewed by me and considered in my medical decision making (see chart for details).  Patient presents to the emergency department for shortness of breath, cough with sputum production.  Patient found to have a low-grade temperature 99.9 in the emergency department, tachypneic and hypoxic.  Requiring 5 L of nasal cannula oxygen to maintain a saturation greater than 90%.  Differential at this time would include pneumonia, bronchitis, CHF exacerbation/pulmonary edema, ACS.  We will check labs including cardiac enzymes and a BNP.  We will obtain a chest x-ray.  We will treat with a DuoNeb given mild expiratory wheeze although clinical picture is more consistent with pneumonia versus CHF.  X-ray consistent with interstitial edema and left lower lobe pneumonia.  Patient placed on BiPAP due to hypoxia on 5 L of nasal cannula oxygen.  Satting in the mid 90s on minimal BiPAP settings 40% FiO2.  Patient's troponin is elevated 0.14 highly suspect this is due to chronic kidney disease with a creatinine of 3.45 although largely unchanged from baseline.  Patient will be treated with antibiotics to cover for  community-acquired pneumonia.  We will dose IV Lasix and continue to closely monitor.  Patient will be admitted to the hospitalist service.  CRITICAL CARE Performed by: Harvest Dark   Total critical care time: 30 minutes  Critical care time was  exclusive of separately billable procedures and treating other patients.  Critical care was necessary to treat or prevent imminent or life-threatening deterioration.  Critical care was time spent personally by me on the following activities: development of treatment plan with patient and/or surrogate as well as nursing, discussions with consultants, evaluation of patient's response to treatment, examination of patient, obtaining history from patient or surrogate, ordering and performing treatments and interventions, ordering and review of laboratory studies, ordering and review of radiographic studies, pulse oximetry and re-evaluation of patient's condition.   ____________________________________________   FINAL CLINICAL IMPRESSION(S) / ED DIAGNOSES  Dyspnea Hypoxia Community-acquired pneumonia CHF exacerbation   Harvest Dark, MD 06/21/18 8144    Harvest Dark, MD 07/01/18 2111

## 2018-06-21 NOTE — ED Notes (Signed)
Pt states he does not normally wear oxygen at home. Pt reports using CPAP machine at night.

## 2018-06-21 NOTE — ED Notes (Signed)
Date and time results received: 06/21/18 2:33 AM  (use smartphrase ".now" to insert current time)  Test: Troponin Critical Value: 0.14  Name of Provider Notified: Dr. Kerman Passey  Orders Received? Or Actions Taken?: Orders Received - See Orders for details

## 2018-06-21 NOTE — ED Notes (Signed)
Pt BiPap changed to 30% O2 per admitting MD to attempt to wean off. Pt also able to tolerate being off mask to eat breakfast while on 5L  without dropping below 92% or breathing faster than 19 bpm. Pt does report it being harder to breathe while mask is off.

## 2018-06-21 NOTE — Progress Notes (Signed)
Admitted this morning for shortness of breath, acute on chronic HF, pneumonia, now on BiPAP.  Seen in the emergency room, agree with admitting physician, continue BiPAP support, bronchodilators, antibiotics, diuretics. #1 CAP: Continue antibiotics 2.  Acute on chronic systolic heart failure: Continue Lasix monitor kidney function closely 3.  CKD stage IV: Patient renal function is almost baseline.  Avoid nephrotoxic agents, monitor while on diuretics. #4 diabetes mellitus type 2: Continue sliding scale insulin with coverage along with home dose insulin.

## 2018-06-21 NOTE — ED Triage Notes (Signed)
Pt presents from home via [facility] with c/o exertional shortness of breath. lox 80's on room air when ems arrived. 18G right AC placed prior to arrival. blood sugar 256. No cardiac or resp hx noted. Pt states he has been coughing up pink sputum recently. Pt current oxygen saturation 90% on 5L. Pt respirations labored nat this time. Pt able to talk in complete sentences, but very short of breath with talking.

## 2018-06-21 NOTE — ED Notes (Signed)
Pt c/o shortness of breath. Respirations appear labored at this time. Oxygen 89% on 5L. MD Paduchowski aware. Respiratory paged at this time. This RN will continue to monitor.

## 2018-06-21 NOTE — H&P (Addendum)
[name] [place] NAME: Patrick Hurst    MR#:  [id number]  DATE OF BIRTH:  December 23, 1955  DATE OF ADMISSION:  06/21/2018  PRIMARY CARE PHYSICIAN: [name], MD   REQUESTING/REFERRING PHYSICIAN: Kerman Passey, MD  CHIEF COMPLAINT:   Chief Complaint  Patient presents with  . Shortness of Breath    HISTORY OF PRESENT ILLNESS:  Patrick Hurst  is a 63 y.o. male who presents with chief complaint as above.  Patient presents to the ED with several days of progressive shortness of breath.  He states that he saw his outpatient physician who recommended he double his diuretic dose on the suspicion that it was his heart failure causing his shortness of breath.  He did so for the last 5 or 6 days, but his dyspnea has progressed.  Today he was extremely short of breath and came to the ED for evaluation.  Imaging here reveals left-sided pneumonia.  Patient has had some cough with sputum production.  Patient was hypoxic in the ED and required BiPAP.  Hospitalist were called for admission  PAST MEDICAL HISTORY:   Past Medical History:  Diagnosis Date  . Allergy   . Anemia   . Arthritis   . Bell's palsy   . Depression   . Diabetes ([place])   . Gastric ulcer   . GERD (gastroesophageal reflux disease)   . Gout   . History of chicken pox   . Hyperlipidemia   . Hypertension   . Hypothyroidism   . OSA (obstructive sleep apnea)   . Thyroid disease   . Vitamin D deficiency      PAST SURGICAL HISTORY:   Past Surgical History:  Procedure Laterality Date  . CATARACT EXTRACTION Bilateral 2014 and 2015  . CHOLECYSTECTOMY  04/28/2011   Laproscopic; Dr. Pat Patrick  . GALLBLADDER SURGERY    . LASIK Bilateral 2019   medical  . SHOULDER SURGERY Right 2012   Dr. Leanor Kail     SOCIAL HISTORY:   Social History   Tobacco Use  . Smoking status: Former Smoker    Packs/day: 1.00    Years: 15.00    Pack years: 15.00    Types: Cigarettes     Last attempt to quit: 05/03/1978    Years since quitting: 40.1  . Smokeless tobacco: Former Systems developer  . Tobacco comment: started smoking at age 72  Substance Use Topics  . Alcohol use: No    Alcohol/week: 0.0 standard drinks     FAMILY HISTORY:   Family History  Problem Relation Age of Onset  . COPD Mother   . Heart disease Father      DRUG ALLERGIES:   Allergies  Allergen Reactions  . Mucinex  [Guaifenesin Er]     Throat swelling, increases heart rate  . Levaquin [Levofloxacin] Palpitations    MEDICATIONS AT HOME:   Prior to Admission medications   Medication Sig Start Date End Date Taking? Authorizing Provider  albuterol (PROAIR HFA) 108 (90 Base) MCG/ACT inhaler USE 2 INHALATIONS EVERY 4 HOURS AS NEEDED FOR WHEEZING OR SHORTNESS OF BREATH 11/14/17   [name], MD  amitriptyline (ELAVIL) 25 MG tablet Take 1 tablet (25 mg total) by mouth at bedtime. 02/14/18   [name], MD  amLODipine (NORVASC) 10 MG tablet TAKE 1 TABLET DAILY 02/07/18   [name], MD  Ascorbic Acid (VITAMIN C) 1000 MG tablet Take 1,000 mg by mouth 2 (two) times daily.  [provider]  aspirin 81 MG tablet Take 81 mg by mouth daily.    [provider]  atorvastatin (LIPITOR) 40 MG tablet Take 1 tablet (40 mg total) by mouth daily. 01/26/18   [name], MD  Calcium Carbonate (CALCIUM 600 PO) Take 1 tablet by mouth daily.    [provider]  cetirizine (ZYRTEC) 10 MG tablet Take 10 mg by mouth daily.     [provider]  cholecalciferol (VITAMIN D) 1000 units tablet Take 1,000 mcg by mouth daily.     [provider]  Cyanocobalamin (B-12 PO) Take 1,000 mcg by mouth daily.     [provider]  hydrALAZINE (APRESOLINE) 10 MG tablet Take one tablet three times daily for 7 days, then increase to two tablets three times daily. 05/23/18   [name], MD  insulin regular human CONCENTRATED (HUMULIN R) 500 UNIT/ML injection Take 20 U  in morning and 30 U in evening, IF blood sugar is > 100 ( Check blood sugar before taking injection), If sugar level < 100- DO NOT take any insuline. 11/21/17   Vaughan Basta, MD  levothyroxine (SYNTHROID, LEVOTHROID) 100 MCG tablet TAKE 1 TABLET BY MOUTH ONCE DAILY 06/02/18   [name], MD  metoprolol tartrate (LOPRESSOR) 50 MG tablet TAKE 1 TABLET TWICE A DAY 12/12/17   [name], MD  pantoprazole (PROTONIX) 40 MG tablet Take 1 tablet (40 mg total) by mouth 2 (two) times daily. 11/18/17   [name], MD  tadalafil (CIALIS) 10 MG tablet Take 1 tablet (10 mg total) by mouth daily as needed. 06/09/18   [name], MD  testosterone cypionate (DEPO-TESTOSTERONE) 200 MG/ML injection Inject 1 mL (200 mg total) into the muscle every 14 (fourteen) days. 01/11/18   [name], MD  torsemide (DEMADEX) 20 MG tablet Take 1 tablet (20 mg total) by mouth daily. Take an extra tablet if your weight goes up by 2 pounds overnight, or 5 pounds over a week 06/09/18   [name], MD  triamcinolone lotion (KENALOG) 0.1 % Apply 1 application topically 3 (three) times daily. 06/23/17   [name], MD  ULORIC 80 MG TABS Take 80 mg by mouth daily.  05/18/17   [provider]    REVIEW OF SYSTEMS:  Review of Systems  Constitutional: Negative for chills, fever, malaise/fatigue and weight loss.  HENT: Negative for ear pain, hearing loss and tinnitus.   Eyes: Negative for blurred vision, double vision, pain and redness.  Respiratory: Positive for cough, sputum production and shortness of breath. Negative for hemoptysis.   Cardiovascular: Positive for leg swelling. Negative for chest pain, palpitations and orthopnea.  Gastrointestinal: Negative for abdominal pain, constipation, diarrhea, nausea and vomiting.  Genitourinary: Negative for dysuria, frequency and hematuria.  Musculoskeletal: Negative for back pain, joint pain and neck pain.  Skin:       No acne, rash, or  lesions  Neurological: Negative for dizziness, tremors, focal weakness and weakness.  Endo/Heme/Allergies: Negative for polydipsia. Does not bruise/bleed easily.  Psychiatric/Behavioral: Negative for depression. The patient is not nervous/anxious and does not have insomnia.      VITAL SIGNS:   Vitals:   06/21/18 0134 06/21/18 0135 06/21/18 0242  BP: (!) 164/75    Pulse: 93    Resp: (!) 27    Temp: 99.9 F (37.7 C)    TempSrc: Oral    SpO2: 91%  94%  Weight:  136.1 kg  Height:  5\' 10"  (1.778 m)    Wt Readings from Last 3 Encounters:  06/21/18 136.1 kg  06/09/18 (!) 141.1 kg  05/23/18 (!) 137.4 kg    PHYSICAL EXAMINATION:  Physical Exam  Vitals reviewed. Constitutional: He is oriented to person, place, and time. He appears well-developed and well-nourished. No distress.  HENT:  Head: Normocephalic and atraumatic.  Mouth/Throat: Oropharynx is clear and moist.  Eyes: Pupils are equal, round, and reactive to light. Conjunctivae and EOM are normal. No scleral icterus.  Neck: Normal range of motion. Neck supple. No JVD present. No thyromegaly present.  Cardiovascular: Normal rate, regular rhythm and intact distal pulses. Exam reveals no gallop and no friction rub.  No murmur heard. Respiratory: He is in respiratory distress (Mild, on BiPAP). He has no wheezes. He has no rales.  Left basilar rhonchi  GI: Soft. Bowel sounds are normal. He exhibits no distension. There is no abdominal tenderness.  Musculoskeletal: Normal range of motion.        General: No edema.     Comments: No arthritis, no gout  Lymphadenopathy:    He has no cervical adenopathy.  Neurological: He is alert and oriented to person, place, and time. No cranial nerve deficit.  No dysarthria, no aphasia  Skin: Skin is warm and dry. No rash noted. No erythema.  Psychiatric: He has a normal mood and affect. His behavior is normal. Judgment and thought content normal.    LABORATORY PANEL:   CBC Recent Labs   Lab 06/21/18 0140  WBC 8.1  HGB 9.0*  HCT 28.5*  PLT 116*   ------------------------------------------------------------------------------------------------------------------  Chemistries  Recent Labs  Lab 06/21/18 0140  NA 139  K 4.2  CL 107  CO2 23  GLUCOSE 209*  BUN 49*  CREATININE 3.45*  CALCIUM 8.0*   ------------------------------------------------------------------------------------------------------------------  Cardiac Enzymes Recent Labs  Lab 06/21/18 0140  TROPONINI 0.14*   ------------------------------------------------------------------------------------------------------------------  RADIOLOGY:  Dg Chest 2 View  Result Date: 06/21/2018 CLINICAL DATA:  Cough and dyspnea EXAM: CHEST - 2 VIEW COMPARISON:  06/23/2017 FINDINGS: Stable cardiomegaly. Nonaneurysmal atherosclerotic aorta is noted. Diffuse pulmonary vascular redistribution is noted. Nodular infiltrate at the left lung base posteriorly is seen. No effusion or pneumothorax. IMPRESSION: Cardiomegaly with pulmonary vascular congestion and interstitial edema. Superimposed nodular infiltrate in the left lower lobe. Followup PA and lateral chest X-ray is recommended in 3-4 weeks following trial of antibiotic therapy to ensure resolution and exclude underlying malignancy. Electronically Signed   By: Ashley Royalty M.D.   On: 06/21/2018 02:00    EKG:   Orders placed or performed during the hospital encounter of 06/21/18  . EKG 12-Lead  . EKG 12-Lead  . ED EKG  . ED EKG    IMPRESSION AND PLAN:  Principal Problem:   Acute respiratory failure with hypoxia (HCC) -due to pneumonia and CHF as below.  Patient's respiratory status has stabilized significantly on BiPAP.  He normally wears CPAP at home due to sleep apnea anyway, which may also have contributed to his evening desaturations.  See below for treatment of underlying problems Active Problems:   CAP (community acquired pneumonia) -IV antibiotics given,  PRN duo nebs and antitussive   Acute on chronic diastolic CHF (congestive heart failure) (HCC) -IV Lasix given in the ED, continue home dose diuretics subsequently and continue other home meds.  Suspect his heart failure exacerbation is due to his pneumonia.  Troponin is mildly elevated, to about where his baseline seems to be.  We will  trend it to make sure it does not rise further   Diabetes mellitus with neuropathy (HCC) -sliding scale insulin coverage along with home dose insulin   Hypertension -continue home meds   CKD (chronic kidney disease), stage IV (HCC) -at baseline, avoid nephrotoxins and monitor   HLD (hyperlipidemia) -home dose antilipid   Hypothyroidism -home dose thyroid replacement   Obstructive sleep apnea -BiPAP for now, then CPAP nightly  Chart review performed and case discussed with ED provider. Labs, imaging and/or ECG reviewed by provider and discussed with patient/family. Management plans discussed with the patient and/or family.  DVT PROPHYLAXIS: SubQ heparin  GI PROPHYLAXIS:  PPI   ADMISSION STATUS: Inpatient     CODE STATUS: Full Code Status History    Date Active Date Inactive Code Status Order ID Comments User Context   11/19/2017 1520 11/21/2017 1748 Full Code [id number]  Fritzi Mandes, MD Inpatient      TOTAL CRITICAL CARE TIME TAKING CARE OF THIS PATIENT: 45 minutes.   Ethlyn Daniels 06/21/2018, 3:16 AM  [facility]  Office  [phone]  CC: Primary care physician; [name], MD  Note:  This document was prepared using Dragon voice recognition software and may include unintentional dictation errors.

## 2018-06-21 NOTE — ED Notes (Signed)
Admitting MD notified of troponin and says pt being admitted to telemetry and she will check meds and adjust.

## 2018-06-21 NOTE — Consult Note (Signed)
PULMONARY/CCM CONSULT NOTE  Requesting MD/Service: Hospitalist Date of initial consultation: 06/21/18 Reason for consultation: Acute on chronic respiratory failure  PT PROFILE: 63 y.o. male with minimal smoking history and chronic exertional dyspnea presented to [facility] ED on the morning of this admission with severe respiratory distress after 2 weeks of gradually progressive worsening dyspnea.  DATA:  INTERVAL:  HPI:  As above.  At his baseline, he has class III dyspnea (limited to 100 m and 1 flight of stairs) who has noted worsening exertional dyspnea over the past 2 weeks and severe dyspnea prompting evaluation in [facility] ED on the day of this evaluation.  He reported to the emergency room personnel that he had been coughing up pink sputum.  Upon his arrival to the emergency department he was noted to have oxygen saturations in the 80s.  He was transiently on NIPPV.  By the time of his transport to the stepdown unit, he was comfortable on nasal cannula oxygen.  In the emergency room he did receive furosemide.  He denies purulent sputum, pleuritic chest pain, anginal chest pain, orthopnea, paroxysmal nocturnal dyspnea, hemoptysis.  He has chronic lower extremity edema.    Past Medical History:  Diagnosis Date  . Allergy   . Anemia   . Arthritis   . Bell's palsy   . CKD (chronic kidney disease)   . Depression   . Diabetes ([place])   . Gastric ulcer   . GERD (gastroesophageal reflux disease)   . Gout   . History of chicken pox   . Hyperlipidemia   . Hypertension   . Hypothyroidism   . OSA (obstructive sleep apnea)   . Thyroid disease   . Vitamin D deficiency     Past Surgical History:  Procedure Laterality Date  . CATARACT EXTRACTION Bilateral 2014 and 2015  . CHOLECYSTECTOMY  04/28/2011   Laproscopic; Dr. Pat Patrick  . GALLBLADDER SURGERY    . LASIK Bilateral 2019   medical  . SHOULDER SURGERY Right 2012   Dr. Leanor Kail    MEDICATIONS: I have reviewed all medications and  confirmed regimen as documented  Social History   Socioeconomic History  . Marital status: Married    Spouse name: Not on file  . Number of children: Not on file  . Years of education: Not on file  . Highest education level: Not on file  Occupational History  . Occupation: Works at [employer]    Comment: Full time  Social Needs  . Financial resource strain: Not on file  . Food insecurity:    Worry: Not on file    Inability: Not on file  . Transportation needs:    Medical: Not on file    Non-medical: Not on file  Tobacco Use  . Smoking status: Former Smoker    Packs/day: 1.00    Years: 15.00    Pack years: 15.00    Types: Cigarettes    Last attempt to quit: 05/03/1978    Years since quitting: 40.1  . Smokeless tobacco: Former Systems developer  . Tobacco comment: started smoking at age 89  Substance and Sexual Activity  . Alcohol use: No    Alcohol/week: 0.0 standard drinks  . Drug use: No  . Sexual activity: Not on file  Lifestyle  . Physical activity:    Days per week: Not on file    Minutes per session: Not on file  . Stress: Not on file  Relationships  . Social connections:    Talks on phone:  Not on file    Gets together: Not on file    Attends religious service: Not on file    Active member of club or organization: Not on file    Attends meetings of clubs or organizations: Not on file    Relationship status: Not on file  . Intimate partner violence:    Fear of current or ex partner: Not on file    Emotionally abused: Not on file    Physically abused: Not on file    Forced sexual activity: Not on file  Other Topics Concern  . Not on file  Social History Narrative  . Not on file    Family History  Problem Relation Age of Onset  . COPD Mother   . Heart disease Father     ROS: No fever, myalgias/arthralgias, unexplained weight loss or weight gain No new focal weakness or sensory deficits No otalgia, hearing loss, visual changes, nasal and sinus symptoms, mouth  and throat problems No neck pain or adenopathy No abdominal pain, N/V/D, diarrhea, change in bowel pattern No dysuria, change in urinary pattern   Vitals:   06/21/18 0930 06/21/18 1201 06/21/18 1300 06/21/18 1400  BP: (!) 176/92 138/78 (!) 143/74 (!) 144/70  Pulse: 85 70 70 72  Resp: (!) 23 20 20 20   Temp:  98.5 F (36.9 C)    TempSrc:  Axillary    SpO2: 95% 92% 95% 96%  Weight:  (!) 137.1 kg    Height:  5\' 10"  (1.778 m)       EXAM:  Gen: WDWN, No overt respiratory distress HEENT: NCAT, sclera white, oropharynx normal Neck: Supple without LAN, thyromegaly, JVD Lungs: breath sounds diffusely diminished with faint bibasilar crackles, no wheezes Cardiovascular: Distant heart sounds, regular, no murmurs noted Abdomen: Soft, nontender, normal BS Ext: Symmetric pretibial and ankle edema without clubbing, cyanosis Neuro: CNs grossly intact, motor and sensory intact Skin: Limited exam, no lesions noted  DATA:   BMP Latest Ref Rng & Units 06/21/2018 06/21/2018 04/04/2018  Glucose 70 - 99 mg/dL 189(H) 209(H) 93  BUN 8 - 23 mg/dL 50(H) 49(H) 81(HH)  Creatinine 0.61 - 1.24 mg/dL 3.23(H) 3.45(H) 4.65(H)  BUN/Creat Ratio 10 - 24 - - 17  Sodium 135 - 145 mmol/L 138 139 145(H)  Potassium 3.5 - 5.1 mmol/L 4.1 4.2 4.3  Chloride 98 - 111 mmol/L 106 107 101  CO2 22 - 32 mmol/L 24 23 18(L)  Calcium 8.9 - 10.3 mg/dL 7.9(L) 8.0(L) 7.3(L)    CBC Latest Ref Rng & Units 06/21/2018 06/21/2018 02/20/2018  WBC 4.0 - 10.5 K/uL 7.6 8.1 5.9  Hemoglobin 13.0 - 17.0 g/dL 8.8(L) 9.0(L) 9.8(L)  Hematocrit 39.0 - 52.0 % 27.4(L) 28.5(L) 30.0(L)  Platelets 150 - 400 K/uL 112(L) 116(L) 108(L)    CXR: Leonor Liv, diffuse interstitial prominence, ill-defined nodular opacities in retrocardiac area of LLL  EKG: No definite acute ischemic changes  I have personally reviewed all chest radiographs reported above including CXRs and CT chest unless otherwise indicated  IMPRESSION:   Chronic exertional dyspnea  with worsening over the past couple weeks Acute on chronic respiratory failure, markedly improved after diuresis Possible LLL CAP Elevated troponin I OSA DM 2 Moderate obesity  PLAN:  Continue supplemental oxygen as needed to maintain SPO2 >90% Continue BiPAP as needed for respiratory distress I have ordered CPAP for sleep.  He has an AutoSet machine at home Continue empiric antibiotics for now PCT algorithm ordered Influenza PCR nasal swab negative.  Discontinue  respiratory isolation  Overall, I suspect his primary respiratory problem is pulmonary edema due to diastolic heart failure and renal failure with chronic volume overload. We consider cardiology evaluation   Merton Border, MD PCCM service Mobile [phone] Pager [phone] 06/21/2018 2:58 PM

## 2018-06-22 ENCOUNTER — Inpatient Hospital Stay: Payer: Commercial Managed Care - PPO

## 2018-06-22 ENCOUNTER — Other Ambulatory Visit: Payer: Self-pay

## 2018-06-22 LAB — PROCALCITONIN: Procalcitonin: 0.19 ng/mL

## 2018-06-22 LAB — CBC
HCT: 29 % — ABNORMAL LOW (ref 39.0–52.0)
Hemoglobin: 9.1 g/dL — ABNORMAL LOW (ref 13.0–17.0)
MCH: 28.3 pg (ref 26.0–34.0)
MCHC: 31.4 g/dL (ref 30.0–36.0)
MCV: 90.1 fL (ref 80.0–100.0)
Platelets: 124 10*3/uL — ABNORMAL LOW (ref 150–400)
RBC: 3.22 MIL/uL — ABNORMAL LOW (ref 4.22–5.81)
RDW: 15.1 % (ref 11.5–15.5)
WBC: 8 10*3/uL (ref 4.0–10.5)
nRBC: 0 % (ref 0.0–0.2)

## 2018-06-22 LAB — BASIC METABOLIC PANEL
Anion gap: 9 (ref 5–15)
BUN: 50 mg/dL — ABNORMAL HIGH (ref 8–23)
CO2: 24 mmol/L (ref 22–32)
Calcium: 8 mg/dL — ABNORMAL LOW (ref 8.9–10.3)
Chloride: 106 mmol/L (ref 98–111)
Creatinine, Ser: 3.28 mg/dL — ABNORMAL HIGH (ref 0.61–1.24)
GFR calc Af Amer: 22 mL/min — ABNORMAL LOW (ref >60)
GFR calc non Af Amer: 19 mL/min — ABNORMAL LOW (ref >60)
Glucose, Bld: 172 mg/dL — ABNORMAL HIGH (ref 70–99)
Potassium: 4.2 mmol/L (ref 3.5–5.1)
Sodium: 139 mmol/L (ref 135–145)

## 2018-06-22 LAB — GLUCOSE, CAPILLARY
Glucose-Capillary: 158 mg/dL — ABNORMAL HIGH (ref 70–99)
Glucose-Capillary: 242 mg/dL — ABNORMAL HIGH (ref 70–99)
Glucose-Capillary: 143 mg/dL — ABNORMAL HIGH (ref 70–99)
Glucose-Capillary: 235 mg/dL — ABNORMAL HIGH (ref 70–99)

## 2018-06-22 NOTE — Progress Notes (Signed)
[facility] [place] NAME: Patrick Hurst    MR#:  [id number]  DATE OF BIRTH:  1955-11-19  SUBJECTIVE:  CHIEF COMPLAINT:    REVIEW OF SYSTEMS:  CONSTITUTIONAL: No fever, fatigue or weakness.  EYES: No blurred or double vision.  EARS, NOSE, AND THROAT: No tinnitus or ear pain.  RESPIRATORY: No cough, shortness of breath, wheezing or hemoptysis.  CARDIOVASCULAR: No chest pain, orthopnea, edema.  GASTROINTESTINAL: No nausea, vomiting, diarrhea or abdominal pain.  GENITOURINARY: No dysuria, hematuria.  ENDOCRINE: No polyuria, nocturia,  HEMATOLOGY: No anemia, easy bruising or bleeding SKIN: No rash or lesion. MUSCULOSKELETAL: No joint pain or arthritis.   NEUROLOGIC: No tingling, numbness, weakness.  PSYCHIATRY: No anxiety or depression.   ROS  DRUG ALLERGIES:   Allergies  Allergen Reactions  . Mucinex  [Guaifenesin Er]     Throat swelling, increases heart rate  . Levaquin [Levofloxacin] Palpitations    VITALS:  Blood pressure (!) 158/75, pulse 86, temperature 98 F (36.7 C), temperature source Oral, resp. rate (!) 24, height 5\' 10"  (1.778 m), weight (!) 137.1 kg, SpO2 93 %.  PHYSICAL EXAMINATION:  GENERAL:  63 y.o.-year-old patient lying in the bed with no acute distress.  EYES: Pupils equal, round, reactive to light and accommodation. No scleral icterus. Extraocular muscles intact.  HEENT: Head atraumatic, normocephalic. Oropharynx and nasopharynx clear.  NECK:  Supple, no jugular venous distention. No thyroid enlargement, no tenderness.  LUNGS: Normal breath sounds bilaterally, no wheezing, rales,rhonchi or crepitation. No use of accessory muscles of respiration.  CARDIOVASCULAR: S1, S2 normal. No murmurs, rubs, or gallops.  ABDOMEN: Soft, nontender, nondistended. Bowel sounds present. No organomegaly or mass.  EXTREMITIES: No pedal edema, cyanosis, or clubbing.  NEUROLOGIC: Cranial nerves II through XII are intact. Muscle  strength 5/5 in all extremities. Sensation intact. Gait not checked.  PSYCHIATRIC: The patient is alert and oriented x 3.  SKIN: No obvious rash, lesion, or ulcer.   Physical Exam LABORATORY PANEL:   CBC Recent Labs  Lab 06/22/18 0351  WBC 8.0  HGB 9.1*  HCT 29.0*  PLT 124*   ------------------------------------------------------------------------------------------------------------------  Chemistries  Recent Labs  Lab 06/22/18 0351  NA 139  K 4.2  CL 106  CO2 24  GLUCOSE 172*  BUN 50*  CREATININE 3.28*  CALCIUM 8.0*   ------------------------------------------------------------------------------------------------------------------  Cardiac Enzymes Recent Labs  Lab 06/21/18 0852 06/21/18 1327  TROPONINI 1.91* 2.04*   ------------------------------------------------------------------------------------------------------------------  RADIOLOGY:  Dg Chest 2 View  Result Date: 06/21/2018 CLINICAL DATA:  Cough and dyspnea EXAM: CHEST - 2 VIEW COMPARISON:  06/23/2017 FINDINGS: Stable cardiomegaly. Nonaneurysmal atherosclerotic aorta is noted. Diffuse pulmonary vascular redistribution is noted. Nodular infiltrate at the left lung base posteriorly is seen. No effusion or pneumothorax. IMPRESSION: Cardiomegaly with pulmonary vascular congestion and interstitial edema. Superimposed nodular infiltrate in the left lower lobe. Followup PA and lateral chest X-ray is recommended in 3-4 weeks following trial of antibiotic therapy to ensure resolution and exclude underlying malignancy. Electronically Signed   By: Ashley Royalty M.D.   On: 06/21/2018 02:00   Dg Chest Port 1 View  Result Date: 06/22/2018 CLINICAL DATA:  Respiratory failure. EXAM: PORTABLE CHEST 1 VIEW COMPARISON:  06/21/2018. FINDINGS: Unchanged cardiomediastinal silhouette. Improved edema and vascular congestion. Decreasing nodular infiltrate LEFT base. IMPRESSION: Improved aeration.  Persistent cardiomegaly.  Electronically Signed   By: Staci Righter M.D.   On: 06/22/2018 07:24    ASSESSMENT AND PLAN:  Admitted this morning for shortness  of breath, acute on chronic HF, pneumonia, now on BiPAP.  Seen in the emergency room, agree with admitting physician, continue BiPAP support, bronchodilators, antibiotics, diuretics  *Acute on chronic hypoxic respiratory failure  Suspected due to acute left lower lobe  Cap BiPAP/supplemental oxygen with weaning as tolerated   *Acute on chronic systolic heart failure Resolved Continue congestive heart failure protocol, torsemide, strict I&O monitoring, daily weights  *Chronic obstructive sleep apnea  BiPAP/CPAP at bedtime/as needed   *CKD stage IV Stable  Continue to avoid nephrotoxic agents,  *Chronic diabetes mellitus type 2 Stable Continue sliding scale insulin with coverage along with home dose insulin.   All the records are reviewed and case discussed with Care Management/Social Workerr. Management plans discussed with the patient, family and they are in agreement.  CODE STATUS: full  TOTAL TIME TAKING CARE OF THIS PATIENT: 35 minutes.     POSSIBLE D/C IN 1-3 DAYS, DEPENDING ON CLINICAL CONDITION.   [name]  M.D on 06/22/2018   Between 7am to 6pm - Pager - [phone]  After 6pm go to [email or web address] - password [email or web address] [facility]  Office  [phone]  CC: Primary care physician; [name], MD  Note: This dictation was prepared with Dragon dictation along with smaller phrase technology. Any transcriptional errors that result from this process are unintentional.

## 2018-06-22 NOTE — Care Management Note (Signed)
Case Management Note  Patient Details  Name: Patrick Hurst MRN: [id number] Date of Birth: May 31, 1955  Subjective/Objective:  Patient admitted with acute respiratory failure initially requiring BiPap.  Patient is currently tolerating 4 L [place].  Patient is up in chair and wife at the bedside.  Patient is from home where he lives with his wife in [place].  Patient is independent in ADL's, requires no assistive devices and drives.  PCP verified as Dr. Caryn Section and he follows up every 3 months.  Pharmacy is [facility] on [address], patient has no trouble affording medications.  Patient does wear a CPAP machine with O2 at 2L just at night, oxygen company is [place].  No discharge needs identified at this time. Doran Clay RN BSN [phone]                Action/Plan:   Expected Discharge Date:                  Expected Discharge Plan:  Home/Self Care  In-House Referral:     Discharge planning Services     Post Acute Care Choice:    Choice offered to:     DME Arranged:    DME Agency:     HH Arranged:    HH Agency:     Status of Service:  In process, will continue to follow  If discussed at Long Length of Stay Meetings, dates discussed:    Additional Comments:  Shelbie Hutching, RN 06/22/2018, 11:07 AM

## 2018-06-22 NOTE — Consult Note (Signed)
Reason for Consult: Shortness of breath leg edema Referring Physician: Dr. Lance Coon hospitalist Patrick Hurst primary  Patrick Hurst is an 63 y.o. male.  HPI: 63 year old obese white male previous smoker presents with longstanding shortness of breath which is gotten worse over the last few weeks complains of dyspnea weakness fatigue leg edema.  Patient has had significant hypoxemia placed on BiPAP and then subsequent oxygen therapy.  Patient was thought to have pneumonia and placed on broad-spectrum antibiotic therapy.  Patient had significant renal insufficiency has been on diuretics which at times worsens his renal function patient denies any chest pain so was admitted for further evaluation and care per troponins elevated to 2.  Cardiology was then consulted  Past Medical History:  Diagnosis Date  . Allergy   . Anemia   . Arthritis   . Bell's palsy   . CKD (chronic kidney disease)   . Depression   . Diabetes ([place])   . Gastric ulcer   . GERD (gastroesophageal reflux disease)   . Gout   . History of chicken pox   . Hyperlipidemia   . Hypertension   . Hypothyroidism   . OSA (obstructive sleep apnea)   . Thyroid disease   . Vitamin D deficiency     Past Surgical History:  Procedure Laterality Date  . CATARACT EXTRACTION Bilateral 2014 and 2015  . CHOLECYSTECTOMY  04/28/2011   Laproscopic; Dr. Pat Patrick  . GALLBLADDER SURGERY    . LASIK Bilateral 2019   medical  . SHOULDER SURGERY Right 2012   Dr. Leanor Kail    Family History  Problem Relation Age of Onset  . COPD Mother   . Heart disease Father     Social History:  reports that he quit smoking about 40 years ago. His smoking use included cigarettes. He has a 15.00 pack-year smoking history. He has quit using smokeless tobacco. He reports that he does not drink alcohol or use drugs.  Allergies:  Allergies  Allergen Reactions  . Mucinex  [Guaifenesin Er]     Throat swelling, increases heart rate  . Levaquin  [Levofloxacin] Palpitations    Medications: I have reviewed the patient's current medications.  Results for orders placed or performed during the hospital encounter of 06/21/18 (from the past 48 hour(s))  Basic metabolic panel     Status: Abnormal   Collection Time: 06/21/18  1:40 AM  Result Value Ref Range   Sodium 139 135 - 145 mmol/L   Potassium 4.2 3.5 - 5.1 mmol/L   Chloride 107 98 - 111 mmol/L   CO2 23 22 - 32 mmol/L   Glucose, Bld 209 (H) 70 - 99 mg/dL   BUN 49 (H) 8 - 23 mg/dL   Creatinine, Ser 3.45 (H) 0.61 - 1.24 mg/dL   Calcium 8.0 (L) 8.9 - 10.3 mg/dL   GFR calc non Af Amer 18 (L) >60 mL/min   GFR calc Af Amer 21 (L) >60 mL/min   Anion gap 9 5 - 15    Comment: Performed at [facility], [address] [place], [place] [address]  CBC     Status: Abnormal   Collection Time: 06/21/18  1:40 AM  Result Value Ref Range   WBC 8.1 4.0 - 10.5 K/uL   RBC 3.16 (L) 4.22 - 5.81 MIL/uL   Hemoglobin 9.0 (L) 13.0 - 17.0 g/dL   HCT 28.5 (L) 39.0 - 52.0 %   MCV 90.2 80.0 - 100.0 fL   MCH 28.5 26.0 -  34.0 pg   MCHC 31.6 30.0 - 36.0 g/dL   RDW 15.5 11.5 - 15.5 %   Platelets 116 (L) 150 - 400 K/uL    Comment: Immature Platelet Fraction may be clinically indicated, consider ordering this additional test [id number]    nRBC 0.0 0.0 - 0.2 %    Comment: Performed at [facility], [address] [place], [place] [address]  Troponin I - ONCE - STAT     Status: Abnormal   Collection Time: 06/21/18  1:40 AM  Result Value Ref Range   Troponin I 0.14 (HH) <0.03 ng/mL    Comment: CRITICAL RESULT CALLED TO, READ BACK BY AND VERIFIED WITH AMY SMITH AT 0224 06/21/2018 SMA Performed at [facility], [address] [place], [place] [address]   Blood gas, venous     Status: None   Collection Time: 06/21/18  1:40 AM  Result Value Ref Range   pH, Ven 7.35 7.250 - 7.430   pCO2, Ven 45 44.0 - 60.0 mmHg   pO2, Ven 39.0 32.0 - 45.0 mmHg   Bicarbonate 24.8 20.0 -  28.0 mmol/L   Acid-base deficit 1.1 0.0 - 2.0 mmol/L   O2 Saturation 70.4 %   Patient temperature 37.0    Collection site VEIN    Sample type VENOUS     Comment: Performed at [facility], [address], [place], [place] [address]  Brain natriuretic peptide     Status: Abnormal   Collection Time: 06/21/18  1:40 AM  Result Value Ref Range   B Natriuretic Peptide 244.0 (H) 0.0 - 100.0 pg/mL    Comment: Performed at [facility], [address] [place], [place] [address]  Blood culture (routine x 2)     Status: None (Preliminary result)   Collection Time: 06/21/18  3:31 AM  Result Value Ref Range   Specimen Description BLOOD LEFT ANTECUBITAL    Special Requests      BOTTLES DRAWN AEROBIC AND ANAEROBIC Blood Culture results may not be optimal due to an excessive volume of blood received in culture bottles   Culture      NO GROWTH 1 DAY Performed at [facility], [address] [place], [place] [address]    Report Status PENDING   Blood culture (routine x 2)     Status: None (Preliminary result)   Collection Time: 06/21/18  3:31 AM  Result Value Ref Range   Specimen Description BLOOD BLOOD RIGHT FOREARM    Special Requests      BOTTLES DRAWN AEROBIC AND ANAEROBIC Blood Culture results may not be optimal due to an excessive volume of blood received in culture bottles   Culture      NO GROWTH 1 DAY Performed at [facility], [address] [place],  [address]    Report Status PENDING   Glucose, capillary     Status: Abnormal   Collection Time: 06/21/18  8:25 AM  Result Value Ref Range   Glucose-Capillary 170 (H) 70 - 99 mg/dL  Basic metabolic panel     Status: Abnormal   Collection Time: 06/21/18  8:52 AM  Result Value Ref Range   Sodium 138 135 - 145 mmol/L   Potassium 4.1 3.5 - 5.1 mmol/L   Chloride 106 98 - 111 mmol/L   CO2 24 22 - 32 mmol/L   Glucose, Bld 189 (H) 70 - 99 mg/dL   BUN 50 (H) 8 - 23 mg/dL   Creatinine, Ser  3.23 (H) 0.61 - 1.24 mg/dL   Calcium 7.9 (L) 8.9 - 10.3 mg/dL   GFR calc non Af Amer 20 (L) >60 mL/min   GFR calc Af Amer 23 (L) >60 mL/min   Anion gap 8 5 - 15    Comment: Performed at [facility], [address] [place], [place] [address]  Troponin I - Now Then Q6H     Status: Abnormal   Collection Time: 06/21/18  8:52 AM  Result Value Ref Range   Troponin I 1.91 (HH) <0.03 ng/mL    Comment: CRITICAL RESULT CALLED TO, READ BACK BY AND VERIFIED WITH JESSICA COLTRAN @0939  ON 06/21/2018 BY FMW Performed at [facility], [address] [place], [place] [address]   CBC     Status: Abnormal   Collection Time: 06/21/18  8:52 AM  Result Value Ref Range   WBC 7.6 4.0 - 10.5 K/uL   RBC 3.11 (L) 4.22 - 5.81 MIL/uL   Hemoglobin 8.8 (L) 13.0 - 17.0 g/dL   HCT 27.4 (L) 39.0 - 52.0 %   MCV 88.1 80.0 - 100.0 fL   MCH 28.3 26.0 - 34.0 pg   MCHC 32.1 30.0 - 36.0 g/dL   RDW 15.5 11.5 - 15.5 %   Platelets 112 (L) 150 - 400 K/uL    Comment: Immature Platelet Fraction may be clinically indicated, consider ordering this additional test [id number]    nRBC 0.0 0.0 - 0.2 %    Comment: Performed at [facility], [address] [place], [place] [address]  Glucose, capillary     Status: Abnormal   Collection Time: 06/21/18 12:05 PM  Result Value Ref Range   Glucose-Capillary 155 (H) 70 - 99 mg/dL  MRSA PCR Screening     Status: None   Collection Time: 06/21/18 12:54 PM  Result Value Ref Range   MRSA by PCR NEGATIVE NEGATIVE    Comment:        The GeneXpert MRSA Assay (FDA approved for NASAL specimens only), is one component of a comprehensive MRSA colonization surveillance program. It is not intended to diagnose MRSA infection nor to guide or monitor treatment for MRSA infections. Performed at [facility], [address] [place], [place] [address]   Influenza panel by PCR (type A & B)     Status: None   Collection Time: 06/21/18 12:54 PM  Result  Value Ref Range   Influenza A By PCR NEGATIVE NEGATIVE   Influenza B By PCR NEGATIVE NEGATIVE    Comment: (NOTE) The Xpert Xpress Flu assay is intended as an aid in the diagnosis of  influenza and should not be used as a sole basis for treatment.  This  assay is FDA approved for nasopharyngeal swab specimens only. Nasal  washings and aspirates are unacceptable for Xpert Xpress Flu testing. Performed at [facility], [address] [place], [place] [address]   Troponin I - Now Then Q6H     Status: Abnormal   Collection Time: 06/21/18  1:27 PM  Result Value Ref Range   Troponin I 2.04 (HH) <0.03 ng/mL    Comment: CRITICAL VALUE NOTED. VALUE IS CONSISTENT WITH PREVIOUSLY REPORTED/CALLED VALUE FMW Performed at [facility], [address] [place], [place] [address]   Glucose, capillary     Status: Abnormal   Collection Time: 06/21/18  4:46 PM  Result Value Ref Range   Glucose-Capillary 219 (H) 70 - 99 mg/dL  Glucose, capillary  Status: Abnormal   Collection Time: 06/21/18  9:33 PM  Result Value Ref Range   Glucose-Capillary 182 (H) 70 - 99 mg/dL   Comment 1 Notify RN   Procalcitonin     Status: None   Collection Time: 06/22/18  3:51 AM  Result Value Ref Range   Procalcitonin 0.19 ng/mL    Comment:        Interpretation: PCT (Procalcitonin) <= 0.5 ng/mL: Systemic infection (sepsis) is not likely. Local bacterial infection is possible. (NOTE)       Sepsis PCT Algorithm           Lower Respiratory Tract                                      Infection PCT Algorithm    ----------------------------     ----------------------------         PCT < 0.25 ng/mL                PCT < 0.10 ng/mL         Strongly encourage             Strongly discourage   discontinuation of antibiotics    initiation of antibiotics    ----------------------------     -----------------------------       PCT 0.25 - 0.50 ng/mL            PCT 0.10 - 0.25 ng/mL               OR       >80%  decrease in PCT            Discourage initiation of                                            antibiotics      Encourage discontinuation           of antibiotics    ----------------------------     -----------------------------         PCT >= 0.50 ng/mL              PCT 0.26 - 0.50 ng/mL               AND        <80% decrease in PCT             Encourage initiation of                                             antibiotics       Encourage continuation           of antibiotics    ----------------------------     -----------------------------        PCT >= 0.50 ng/mL                  PCT > 0.50 ng/mL               AND         increase in PCT                  Strongly encourage  initiation of antibiotics    Strongly encourage escalation           of antibiotics                                     -----------------------------                                           PCT <= 0.25 ng/mL                                                 OR                                        > 80% decrease in PCT                                     Discontinue / Do not initiate                                             antibiotics Performed at [facility], [address] [place], [place] [address]   Basic metabolic panel     Status: Abnormal   Collection Time: 06/22/18  3:51 AM  Result Value Ref Range   Sodium 139 135 - 145 mmol/L   Potassium 4.2 3.5 - 5.1 mmol/L   Chloride 106 98 - 111 mmol/L   CO2 24 22 - 32 mmol/L   Glucose, Bld 172 (H) 70 - 99 mg/dL   BUN 50 (H) 8 - 23 mg/dL   Creatinine, Ser 3.28 (H) 0.61 - 1.24 mg/dL   Calcium 8.0 (L) 8.9 - 10.3 mg/dL   GFR calc non Af Amer 19 (L) >60 mL/min   GFR calc Af Amer 22 (L) >60 mL/min   Anion gap 9 5 - 15    Comment: Performed at [facility], [address] [place], [place] [address]  CBC     Status: Abnormal   Collection Time: 06/22/18  3:51 AM  Result Value Ref Range   WBC 8.0  4.0 - 10.5 K/uL   RBC 3.22 (L) 4.22 - 5.81 MIL/uL   Hemoglobin 9.1 (L) 13.0 - 17.0 g/dL   HCT 29.0 (L) 39.0 - 52.0 %   MCV 90.1 80.0 - 100.0 fL   MCH 28.3 26.0 - 34.0 pg   MCHC 31.4 30.0 - 36.0 g/dL   RDW 15.1 11.5 - 15.5 %   Platelets 124 (L) 150 - 400 K/uL   nRBC 0.0 0.0 - 0.2 %    Comment: Performed at [facility], [address] [place], [place] [address]  Glucose, capillary     Status: Abnormal   Collection Time: 06/22/18  7:40 AM  Result Value Ref Range   Glucose-Capillary 158 (H) 70 - 99 mg/dL  Glucose, capillary     Status: Abnormal   Collection Time:  06/22/18 11:21 AM  Result Value Ref Range   Glucose-Capillary 242 (H) 70 - 99 mg/dL    Dg Chest 2 View  Result Date: 06/21/2018 CLINICAL DATA:  Cough and dyspnea EXAM: CHEST - 2 VIEW COMPARISON:  06/23/2017 FINDINGS: Stable cardiomegaly. Nonaneurysmal atherosclerotic aorta is noted. Diffuse pulmonary vascular redistribution is noted. Nodular infiltrate at the left lung base posteriorly is seen. No effusion or pneumothorax. IMPRESSION: Cardiomegaly with pulmonary vascular congestion and interstitial edema. Superimposed nodular infiltrate in the left lower lobe. Followup PA and lateral chest X-ray is recommended in 3-4 weeks following trial of antibiotic therapy to ensure resolution and exclude underlying malignancy. Electronically Signed   By: Ashley Royalty M.D.   On: 06/21/2018 02:00   Dg Chest Port 1 View  Result Date: 06/22/2018 CLINICAL DATA:  Respiratory failure. EXAM: PORTABLE CHEST 1 VIEW COMPARISON:  06/21/2018. FINDINGS: Unchanged cardiomediastinal silhouette. Improved edema and vascular congestion. Decreasing nodular infiltrate LEFT base. IMPRESSION: Improved aeration.  Persistent cardiomegaly. Electronically Signed   By: Staci Righter M.D.   On: 06/22/2018 07:24    Review of Systems  Constitutional: Positive for malaise/fatigue and weight loss.  HENT: Positive for congestion.   Eyes: Negative.   Respiratory:  Positive for shortness of breath.   Cardiovascular: Positive for palpitations, orthopnea, leg swelling and PND.  Gastrointestinal: Negative.   Genitourinary: Negative.   Musculoskeletal: Negative.   Skin: Negative.   Neurological: Positive for weakness.  Endo/Heme/Allergies: Negative.   Psychiatric/Behavioral: Negative.    Blood pressure (!) 155/79, pulse 72, temperature 98.9 F (37.2 C), resp. rate 18, height 5\' 11"  (1.803 m), weight (!) 136.6 kg, SpO2 92 %. Physical Exam  Nursing note and vitals reviewed. Constitutional: He is oriented to person, place, and time. He appears well-developed and well-nourished.  HENT:  Head: Normocephalic and atraumatic.  Eyes: Pupils are equal, round, and reactive to light. Conjunctivae and EOM are normal.  Neck: Normal range of motion. Neck supple.  Cardiovascular: Normal rate and regular rhythm.  Murmur heard. Respiratory: Effort normal and breath sounds normal.  GI: Soft. Bowel sounds are normal.  Musculoskeletal: Normal range of motion.        General: Edema present.  Neurological: He is alert and oriented to person, place, and time. He has normal reflexes.  Skin: Skin is warm and dry.  Psychiatric: He has a normal mood and affect.    Assessment/Plan: Shortness of breath Obesity Hypoxemia Obstructive sleep apnea chronic renal sufficiency Troponin elevated Diabetes Pneumonia Smoking Edema Morbid obesity . Plan Supplemental oxygen therapy Broad-spectrum antibiotic therapy Sleep study CPAP as indicated Follow-up troponins Renal insufficiency follow-up with nephrology Continue diabetes management and control Continue to avoid tobacco abuse Lower extremity edema continue diuretic therapy Inhalers as necessary for lung disease Consider echocardiogram Agree with diuretic therapy  Kayne Yuhas D Saifan Rayford 06/22/2018, 4:51 PM

## 2018-06-22 NOTE — Progress Notes (Signed)
76 Transferred to room 231 via wheelchair. Now on 2 lters nasal cannula. Good day. Up inn room . No complaints,

## 2018-06-23 ENCOUNTER — Inpatient Hospital Stay
Admit: 2018-06-23 | Discharge: 2018-06-23 | Disposition: A | Discharge status: Home / Self Care | Payer: Commercial Managed Care - PPO | Attending: Family Medicine | Admitting: Family Medicine

## 2018-06-23 LAB — GLUCOSE, CAPILLARY
Glucose-Capillary: 191 mg/dL — ABNORMAL HIGH (ref 70–99)
Glucose-Capillary: 208 mg/dL — ABNORMAL HIGH (ref 70–99)
Glucose-Capillary: 197 mg/dL — ABNORMAL HIGH (ref 70–99)
Glucose-Capillary: 156 mg/dL — ABNORMAL HIGH (ref 70–99)

## 2018-06-23 LAB — ECHOCARDIOGRAM COMPLETE
Height: 71 in
Weight: 4756.8 oz

## 2018-06-23 LAB — BASIC METABOLIC PANEL
Anion gap: 11 (ref 5–15)
BUN: 57 mg/dL — AB (ref 8–23)
CHLORIDE: 106 mmol/L (ref 98–111)
CO2: 22 mmol/L (ref 22–32)
Calcium: 8 mg/dL — ABNORMAL LOW (ref 8.9–10.3)
Creatinine, Ser: 3.19 mg/dL — ABNORMAL HIGH (ref 0.61–1.24)
GFR calc Af Amer: 23 mL/min — ABNORMAL LOW (ref >60)
GFR calc non Af Amer: 20 mL/min — ABNORMAL LOW (ref >60)
Glucose, Bld: 175 mg/dL — ABNORMAL HIGH (ref 70–99)
POTASSIUM: 4.6 mmol/L (ref 3.5–5.1)
Sodium: 139 mmol/L (ref 135–145)

## 2018-06-23 LAB — PROCALCITONIN: PROCALCITONIN: 0.17 ng/mL

## 2018-06-23 MED ORDER — METOLAZONE 5 MG PO TABS
5.0000 mg | ORAL_TABLET | Freq: Once | ORAL | Status: AC
  Administered 2018-06-23: 5 mg via ORAL
  Filled 2018-06-23: qty 1

## 2018-06-23 MED ORDER — FUROSEMIDE 10 MG/ML IJ SOLN
40.0000 mg | Freq: Two times a day (BID) | INTRAMUSCULAR | Status: DC
  Administered 2018-06-23 – 2018-06-25 (×5): 40 mg via INTRAVENOUS
  Filled 2018-06-23 (×5): qty 4

## 2018-06-23 MED ORDER — SODIUM CHLORIDE 0.9% FLUSH
3.0000 mL | Freq: Two times a day (BID) | INTRAVENOUS | Status: DC
  Administered 2018-06-23 – 2018-06-26 (×7): 3 mL via INTRAVENOUS

## 2018-06-23 MED ORDER — SODIUM CHLORIDE 0.9% FLUSH: 3.0000 mL | INTRAVENOUS | Status: DC | PRN

## 2018-06-23 MED ORDER — SODIUM CHLORIDE 0.9% FLUSH
3.0000 mL | Freq: Two times a day (BID) | INTRAVENOUS | Status: DC
  Administered 2018-06-23 – 2018-06-26 (×5): 3 mL via INTRAVENOUS

## 2018-06-23 NOTE — Evaluation (Signed)
Physical Therapy Evaluation Patient Details Name: Patrick Hurst MRN: [id number] DOB: [birth year] Today's Date: 06/23/2018   History of Present Illness  Patient is a pleasant 63 year old male who presents to hospital for acute respiratory failure. Patient has PMH of DM, HLD, HTN, Hypothyroidism, sleep apnea, CAP, CKD, and CHF. Patient does not use 02 at home however is currently on 2L of 02 via nasal cannula at hospital.   Clinical Impression  Patient is a pleasant 63 year old male who presents for acute respiratory failure. Patient is currently on 2L02 via nasal cannula however does not normally utilize oxygen at home. Patient ambulated >1.0 m/s without AD and with good stability and per his and his wife's report is back to "about my normal" in regards to mobility. Sp02 was monitored throughout session remaining >90 for ambulation however did drop momentarily with transfer, promptly rising with verbal cueing for breathing through nose. Due to patient's functional strength, independence with transfers and mobility, and return to baseline no further PT will be required at this time.     Follow Up Recommendations No PT follow up    Equipment Recommendations       Recommendations for Other Services       Precautions / Restrictions Precautions Precautions: None Precaution Comments: wearing 2L 02 via nasal cannula       Mobility  Bed Mobility Overal bed mobility: Independent             General bed mobility comments: Independent supine to sit   Transfers Overall transfer level: Independent               General transfer comment: Independent transfers on 2L 02 via nasal cannula. Sp02 dropped to 89 for ~10 seconds then returned to >90.   Ambulation/Gait Ambulation/Gait assistance: Independent Gait Distance (Feet): 180 Feet       Gait velocity interpretation: >4.37 ft/sec, indicative of normal walking speed General Gait Details: Patient has trunk lean to L with slight  vaulting gait without AD. Use of 2L02 via nasal cannula required with SP02 remaining >90.   Stairs            Wheelchair Mobility    Modified Rankin (Stroke Patients Only)       Balance Overall balance assessment: Independent                                           Pertinent Vitals/Pain Pain Assessment: No/denies pain    Home Living Family/patient expects to be discharged to:: Private residence Living Arrangements: Spouse/significant other Available Help at Discharge: Family Type of Home: House Home Access: Stairs to enter Entrance Stairs-Rails: Right Entrance Stairs-Number of Steps: depending upon which side of the house. about 5 steps from back where patient normally goes up.  Home Layout: One level Home Equipment: Grab bars - tub/shower;Shower seat - built in Additional Comments: Patient is independent at home and at work without AD's.     Prior Function Level of Independence: Independent         Comments: Patient is independent at home and at work. Works in [employer] and goes fishin gin his free time.      Hand Dominance   Dominant Hand: Right    Extremity/Trunk Assessment   Upper Extremity Assessment Upper Extremity Assessment: Overall WFL for tasks assessed    Lower Extremity Assessment Lower Extremity Assessment: Overall  WFL for tasks assessed(4+/5 BLE , normal coordination)    Cervical / Trunk Assessment Cervical / Trunk Assessment: Normal  Communication   Communication: No difficulties  Cognition Arousal/Alertness: Awake/alert Behavior During Therapy: WFL for tasks assessed/performed Overall Cognitive Status: Within Functional Limits for tasks assessed                                 General Comments: Alert and oriented. Able to follow complex commands      General Comments General comments (skin integrity, edema, etc.): Patient performs sitting and dynamic stability without UE support reaching inside  and outside BOS without LOB. Able to ambulate without AD.     Exercises Other Exercises Other Exercises: supine ankle pumps 15x  Other Exercises: seated EOB monitoring Sp02 via 2L 02 x4 minues with education on breathing in through nose.  Other Exercises: ambulate with supervision/CGA with >1.67m/s and 2L 02 via nasal cannula and sp02 >90.    Assessment/Plan    PT Assessment Patent does not need any further PT services  PT Problem List         PT Treatment Interventions      PT Goals (Current goals can be found in the Care Plan section)  Acute Rehab PT Goals Patient Stated Goal: to return home PT Goal Formulation: With patient/family Time For Goal Achievement: 07/07/18 Potential to Achieve Goals: Good    Frequency     Barriers to discharge        Co-evaluation               AM-PAC PT "6 Clicks" Mobility  Outcome Measure Help needed turning from your back to your side while in a flat bed without using bedrails?: None Help needed moving from lying on your back to sitting on the side of a flat bed without using bedrails?: None Help needed moving to and from a bed to a chair (including a wheelchair)?: None Help needed standing up from a chair using your arms (e.g., wheelchair or bedside chair)?: None Help needed to walk in hospital room?: None Help needed climbing 3-5 steps with a railing? : None 6 Click Score: 24    End of Session Equipment Utilized During Treatment: Gait belt;Oxygen(2L 02 via nasal cannula ) Activity Tolerance: Patient tolerated treatment well Patient left: in bed;with family/visitor present;with call bell/phone within reach Nurse Communication: Mobility status PT Visit Diagnosis: Other abnormalities of gait and mobility (R26.89)    Time: 6599-3570 PT Time Calculation (min) (ACUTE ONLY): 24 min   Charges:   PT Evaluation $PT Eval Low Complexity: 1 Low PT Treatments $Gait Training: 8-22 mins        Janna Arch, PT, DPT    Janna Arch 06/23/2018, 12:56 PM

## 2018-06-23 NOTE — Progress Notes (Signed)
*  PRELIMINARY RESULTS* Echocardiogram 2D Echocardiogram has been performed.  Patrick Hurst 06/23/2018, 2:17 PM

## 2018-06-23 NOTE — Care Management Note (Signed)
Case Management Note  Patient Details  Name: Patrick Hurst MRN: [id number] Date of Birth: 03/14/1956  Subjective/Objective:     Patient is from home with wife Patrick Hurst.  He has been admitted with SOB; acute respiratory failure.  Hx of CHF and CKD.  Echo completed.  Currently on 2L oxygen acute.  Uses a CPAP machine at home.  He is independent in all ADL's; works full time.  He is current with his PCP.  Obtains prescriptions at [facility] in [place] without difficulty.  Current with PCP, Dr. Caryn Section.  Brought Living Better with Heart Failure booklet to wife.  Patient is currently sleeping. Has a functioning scale at home.   Has a heart failure clinic appointment.             Action/Plan:   Expected Discharge Date:                  Expected Discharge Plan:  Home/Self Care  In-House Referral:     Discharge planning Services     Post Acute Care Choice:    Choice offered to:     DME Arranged:    DME Agency:     HH Arranged:    HH Agency:     Status of Service:  Completed, signed off  If discussed at [employer] of Stay Meetings, dates discussed:    Additional Comments:  Elza Rafter, RN 06/23/2018, 3:33 PM

## 2018-06-23 NOTE — Progress Notes (Signed)
Inpatient Diabetes Program Recommendations  AACE/ADA: New Consensus Statement on Inpatient Glycemic Control (2015)  Target Ranges:  Prepandial:   less than 140 mg/dL      Peak postprandial:   less than 180 mg/dL (1-2 hours)      Critically ill patients:  140 - 180 mg/dL   Results for JAESON, MOLSTAD (MRN [id number]) as of 06/23/2018 10:31  Ref. Range 06/22/2018 07:40 06/22/2018 11:21 06/22/2018 17:15 06/22/2018 21:14  Glucose-Capillary Latest Ref Range: 70 - 99 mg/dL 158 (H)  12 units NOVOLOG  242 (H)  13 units NOVOLOG  143 (H)  11 units NOVOLOG  235 (H)  2 units NOVOLOG    Results for OLUWATOMISIN, DEMAN (MRN [id number]) as of 06/23/2018 10:31  Ref. Range 06/23/2018 07:27  Glucose-Capillary Latest Ref Range: 70 - 99 mg/dL 191 (H)  12 units NOVOLOG     Admit with: Acute respiratory failure with hypoxia due to pneumonia and CHF   History: DM  Home DM Meds: U500 Concentrated Insulin 20 units AM/ 30 units PM if CBG >100 mg/dl  Current Orders: Novolog Sensitive Correction Scale/ SSI (0-9 units) TID AC + HS      Novolog 10 units BID     MD- Please consider the following in-hospital insulin adjustments:  1. Start Lantus 13 units Daily (0.1 units/kg dosing based on weight of 134 kg)  2. Increase frequency of Novolog 10 units meal coverage to TID with meals  (currently only ordered BID)     --Will follow patient during hospitalization--  Wyn Quaker RN, MSN, CDE Diabetes Coordinator Inpatient Glycemic Control Team Team Pager: [phone] (8a-5p)

## 2018-06-23 NOTE — Progress Notes (Addendum)
[facility] NAME: Patrick Hurst    MR#:  [id number]  DATE OF BIRTH:  [birth year]  SUBJECTIVE:  CHIEF COMPLAINT:   In discussion with the patient and nursing staff, wife present-patient with hypoxia with mild activity in his own room, O2 sat desaturation into the 80s, continues to complain of worsening leg swelling, will increase diuretics, consult nephrology given stage IV renal disease, repeat echocardiogram  REVIEW OF SYSTEMS:  CONSTITUTIONAL: No fever, fatigue or weakness.  EYES: No blurred or double vision.  EARS, NOSE, AND THROAT: No tinnitus or ear pain.  RESPIRATORY: No cough, shortness of breath, wheezing or hemoptysis.  CARDIOVASCULAR: No chest pain, orthopnea, edema.  GASTROINTESTINAL: No nausea, vomiting, diarrhea or abdominal pain.  GENITOURINARY: No dysuria, hematuria.  ENDOCRINE: No polyuria, nocturia,  HEMATOLOGY: No anemia, easy bruising or bleeding SKIN: No rash or lesion. MUSCULOSKELETAL: No joint pain or arthritis.   NEUROLOGIC: No tingling, numbness, weakness.  PSYCHIATRY: No anxiety or depression.   ROS  DRUG ALLERGIES:   Allergies  Allergen Reactions  . Mucinex  [Guaifenesin Er]     Throat swelling, increases heart rate  . Levaquin [Levofloxacin] Palpitations    VITALS:  Blood pressure (!) 153/77, pulse 85, temperature 98.4 F (36.9 C), temperature source Oral, resp. rate (!) 24, height 5\' 11"  (1.803 m), weight 134.9 kg, SpO2 90 %.  PHYSICAL EXAMINATION:  GENERAL:  63 y.o.-year-old patient lying in the bed with no acute distress.  EYES: Pupils equal, round, reactive to light and accommodation. No scleral icterus. Extraocular muscles intact.  HEENT: Head atraumatic, normocephalic. Oropharynx and nasopharynx clear.  NECK:  Supple, no jugular venous distention. No thyroid enlargement, no tenderness.  LUNGS: Normal breath sounds bilaterally, no wheezing, rales,rhonchi or crepitation. No use of accessory  muscles of respiration.  CARDIOVASCULAR: S1, S2 normal. No murmurs, rubs, or gallops.  ABDOMEN: Soft, nontender, nondistended. Bowel sounds present. No organomegaly or mass.  EXTREMITIES: No pedal edema, cyanosis, or clubbing.  NEUROLOGIC: Cranial nerves II through XII are intact. Muscle strength 5/5 in all extremities. Sensation intact. Gait not checked.  PSYCHIATRIC: The patient is alert and oriented x 3.  SKIN: No obvious rash, lesion, or ulcer.   Physical Exam LABORATORY PANEL:   CBC Recent Labs  Lab 06/22/18 0351  WBC 8.0  HGB 9.1*  HCT 29.0*  PLT 124*   ------------------------------------------------------------------------------------------------------------------  Chemistries  Recent Labs  Lab 06/22/18 0351  NA 139  K 4.2  CL 106  CO2 24  GLUCOSE 172*  BUN 50*  CREATININE 3.28*  CALCIUM 8.0*   ------------------------------------------------------------------------------------------------------------------  Cardiac Enzymes Recent Labs  Lab 06/21/18 0852 06/21/18 1327  TROPONINI 1.91* 2.04*   ------------------------------------------------------------------------------------------------------------------  RADIOLOGY:  Dg Chest Port 1 View  Result Date: 06/22/2018 CLINICAL DATA:  Respiratory failure. EXAM: PORTABLE CHEST 1 VIEW COMPARISON:  06/21/2018. FINDINGS: Unchanged cardiomediastinal silhouette. Improved edema and vascular congestion. Decreasing nodular infiltrate LEFT base. IMPRESSION: Improved aeration.  Persistent cardiomegaly. Electronically Signed   By: Staci Righter M.D.   On: 06/22/2018 07:24    ASSESSMENT AND PLAN:  Admitted this morning for shortness of breath, acute on chronic HF, pneumonia, now on BiPAP.  Seen in the emergency room, agree with admitting physician, continue BiPAP support, bronchodilators, antibiotics, diuretics  *Acute on chronic hypoxic respiratory failure  Suspected due to acute on chronic diastolic congestive heart  failure exacerbation, extreme morbid obesity/pickwickian state Subsequently weaned off BiPAP, continue supplemental oxygen with weaning as tolerated   *Acute  on chronic diastolic congestive heart failure exacerbation  Continue worsening edema, hypoxia with minimal activity, Rales with diminished breath sounds bilaterally Most recent echocardiogram with normal ejection fraction  CHF protocol, IV Lasix twice daily, Zaroxolyn p.o. x1, discontinue torsemide, strict I&O monitoring, daily weights, repeat echocardiogram, and continue close medical monitoring   *Chronic OSA Stable  Continue BiPAP/CPAP at bedtime/as needed   *CKD stage IV Continue to avoid nephrotoxic agents Consult nephrology given need for aggressive IV diuretics in the setting of stage IV renal disease, BMP daily, strict I&O monitoring  *Chronic diabetes mellitus type 2 Stable Continue sliding scale insulin with coverage along with home dose insulin.  Disposition pending clinical course, home in 1-2 days barring any complications  All the records are reviewed and case discussed with Care Management/Social Workerr. Management plans discussed with the patient, family and they are in agreement.  CODE STATUS: full  TOTAL TIME TAKING CARE OF THIS PATIENT: 35 minutes.     POSSIBLE D/C IN 1-2 DAYS, DEPENDING ON CLINICAL CONDITION.   [name] Salary M.D on 06/23/2018   Between 7am to 6pm - Pager - [phone]  After 6pm go to [email or web address] - password [email or web address] [facility]  Office  [phone]  CC: Primary care physician; [name], MD  Note: This dictation was prepared with Dragon dictation along with smaller phrase technology. Any transcriptional errors that result from this process are unintentional.

## 2018-06-23 NOTE — Progress Notes (Signed)
SATURATION QUALIFICATIONS: (This note is used to comply with regulatory documentation for home oxygen)  Patient Saturations on Room Air at Rest = 93  Patient Saturations on Room Air while Ambulating = 87-90%  Patient Saturations on 0 Liters of oxygen while Ambulating = 87-90%  Please briefly explain why patient needs home oxygen: Pt oxygen drop to 87 while ambulating.

## 2018-06-23 NOTE — Progress Notes (Signed)
[place], [place] 06/23/18  Subjective:   Patient known to our practice from outpatient. He is followed for advanced CKD Presented to the ER from home via EMS with c/o exertional dyspnea, low sats in 80's, cough with sputum Patient's wife reports that he is always short of breath but dyspnea on exertion got worse last few days prior to admission Required biPAP initially during hospitalization Now on [place] O2 supplementation S Creatinine trends reviewed  Objective:  Vital signs in last 24 hours:  Temp:  [97.8 F (36.6 C)-98.4 F (36.9 C)] 98.4 F (36.9 C) (02/21 0438) Pulse Rate:  [72-85] 85 (02/21 0833) Resp:  [18-24] 24 (02/21 0833) BP: (135-180)/(63-88) 153/77 (02/21 0833) SpO2:  [90 %-96 %] 90 % (02/21 1243) Weight:  [134.9 kg-136.6 kg] 134.9 kg (02/21 0438)  Weight change: -0.477 kg Filed Weights   06/21/18 1201 06/22/18 1457 06/23/18 0438  Weight: (!) 137.1 kg (!) 136.6 kg 134.9 kg    Intake/Output:    Intake/Output Summary (Last 24 hours) at 06/23/2018 1437 Last data filed at 06/23/2018 1226 Gross per 24 hour  Intake 240 ml  Output 2225 ml  Net -1985 ml     Physical Exam: General: Obese gentle man, laying in bed  HEENT Anicteric, moist oral mucus membranes  Neck Short, thick  Pulm/lungs Coarse at bases, mild rhonchi  CVS/Heart No rub  Abdomen:   Soft, obese, nontender  Extremities:  Trace to 1+ edema  Neurologic:  Alert, oriented  Skin:  No acute rashes    Basic Metabolic Panel:  Recent Labs  Lab 06/21/18 0140 06/21/18 0852 06/22/18 0351 06/23/18 0545  NA 139 138 139 139  K 4.2 4.1 4.2 4.6  CL 107 106 106 106  CO2 23 24 24 22   GLUCOSE 209* 189* 172* 175*  BUN 49* 50* 50* 57*  CREATININE 3.45* 3.23* 3.28* 3.19*  CALCIUM 8.0* 7.9* 8.0* 8.0*     CBC: Recent Labs  Lab 06/21/18 0140 06/21/18 0852 06/22/18 0351  WBC 8.1 7.6 8.0  HGB 9.0* 8.8* 9.1*  HCT 28.5* 27.4* 29.0*  MCV 90.2 88.1 90.1  PLT 116* 112* 124*       Lab Results  Component Value Date   HEPBSAG Negative 11/20/2017   HEPBSAB Non Reactive 11/20/2017   HEPBIGM Negative 11/20/2017      Microbiology:  Recent Results (from the past 240 hour(s))  Blood culture (routine x 2)     Status: None (Preliminary result)   Collection Time: 06/21/18  3:31 AM  Result Value Ref Range Status   Specimen Description BLOOD LEFT ANTECUBITAL  Final   Special Requests   Final    BOTTLES DRAWN AEROBIC AND ANAEROBIC Blood Culture results may not be optimal due to an excessive volume of blood received in culture bottles   Culture   Final    NO GROWTH 2 DAYS Performed at [facility], [address], [place], [place] [address]    Report Status PENDING  Incomplete  Blood culture (routine x 2)     Status: None (Preliminary result)   Collection Time: 06/21/18  3:31 AM  Result Value Ref Range Status   Specimen Description BLOOD BLOOD RIGHT FOREARM  Final   Special Requests   Final    BOTTLES DRAWN AEROBIC AND ANAEROBIC Blood Culture results may not be optimal due to an excessive volume of blood received in culture bottles   Culture   Final    NO GROWTH 2 DAYS Performed at  [facility], [address], [place], [place] [address]    Report Status PENDING  Incomplete  MRSA PCR Screening     Status: None   Collection Time: 06/21/18 12:54 PM  Result Value Ref Range Status   MRSA by PCR NEGATIVE NEGATIVE Final    Comment:        The GeneXpert MRSA Assay (FDA approved for NASAL specimens only), is one component of a comprehensive MRSA colonization surveillance program. It is not intended to diagnose MRSA infection nor to guide or monitor treatment for MRSA infections. Performed at [facility], [address] [place], [place] [address]     Coagulation Studies: No results for input(s): LABPROT, INR in the last 72 hours.  Urinalysis: No results for input(s): COLORURINE, LABSPEC, PHURINE, GLUCOSEU, HGBUR,  BILIRUBINUR, KETONESUR, PROTEINUR, UROBILINOGEN, NITRITE, LEUKOCYTESUR in the last 72 hours.  Invalid input(s): APPERANCEUR    Imaging: Dg Chest Port 1 View  Result Date: 06/22/2018 CLINICAL DATA:  Respiratory failure. EXAM: PORTABLE CHEST 1 VIEW COMPARISON:  06/21/2018. FINDINGS: Unchanged cardiomediastinal silhouette. Improved edema and vascular congestion. Decreasing nodular infiltrate LEFT base. IMPRESSION: Improved aeration.  Persistent cardiomegaly. Electronically Signed   By: Staci Righter M.D.   On: 06/22/2018 07:24     Medications:    . amLODipine  10 mg Oral Daily  . aspirin  81 mg Oral Daily  . atorvastatin  40 mg Oral Daily  . febuxostat  80 mg Oral Daily  . furosemide  40 mg Intravenous BID  . heparin  5,000 Units Subcutaneous Q8H  . hydrALAZINE  10 mg Oral Q8H  . insulin aspart  0-5 Units Subcutaneous QHS  . insulin aspart  0-9 Units Subcutaneous TID WC  . insulin aspart  10 Units Subcutaneous BID WC  . levothyroxine  100 mcg Oral Daily  . metoprolol tartrate  50 mg Oral BID  . pantoprazole  40 mg Oral BID  . sodium chloride flush  3 mL Intravenous Q12H  . sodium chloride flush  3 mL Intravenous Q12H   benzonatate, chlorpheniramine-HYDROcodone, ipratropium-albuterol, ondansetron **OR** ondansetron (ZOFRAN) IV, sodium chloride flush  Assessment/ Plan:  63 y.o. Caucasian male with longstanding diabetes greater than 30 years, neuropathy, nephropathy, hyperlipidemia, obstructive sleep apnea, obesity, hypertension, hypothyroidism, nonalcoholic fatty liver disease, cirrhosis, diastolic CHF  Chronic kidney disease stage IV Patient has advanced chronic kidney disease due to atherosclerosis, diabetes and hypertension His serum creatinine appears to be close to baseline  Shortness of breath Patient was evaluated by critical care/pulmonary service.  He was continued on BiPAP for respiratory distress.  CPAP at night.  Influenza swab is negative.Marland Kitchen  He was also evaluated by  cardiologist and recommended IV diuresis 2D echo done today.  Results pending.  Currently getting IV furosemide 40 mg twice a day Continue to monitor daily weight Continue to monitor metabolic panel daily.  We will follow along.  Nonalcoholic cirrhosis and fatty liver -Probably playing a role in third spacing -Recent albumin level normal at 4.3 in December 2019   LOS: [place] 2/21/20202:37 PM  [place], [place]  Note: This note was prepared with Dragon dictation. Any transcription errors are unintentional

## 2018-06-23 NOTE — Progress Notes (Signed)
When I mentioned CHF to the patient he told me he didn't have CHF, but staff have been talking like he does have.  After reviewing the chart,  CHF is listed in most notes, yet it is not included on his problem list. He does not seem to have a Cardiologist. He tells me he has been on diuretics for years and his PCP has him take extra doses when he is SOB. He does not remember anyone sitting him down, telling him he has CHF and explaining what that means.  Gave him Better Living with CHF.  Talked to him about what CHF is and the simple things he can do to keep it under control.  Will also start showing him the [employer] videos.

## 2018-06-24 LAB — BASIC METABOLIC PANEL
Anion gap: 7 (ref 5–15)
BUN: 61 mg/dL — ABNORMAL HIGH (ref 8–23)
CO2: 27 mmol/L (ref 22–32)
Calcium: 8.1 mg/dL — ABNORMAL LOW (ref 8.9–10.3)
Chloride: 105 mmol/L (ref 98–111)
Creatinine, Ser: 3.22 mg/dL — ABNORMAL HIGH (ref 0.61–1.24)
GFR calc Af Amer: 23 mL/min — ABNORMAL LOW (ref >60)
GFR calc non Af Amer: 20 mL/min — ABNORMAL LOW (ref >60)
Glucose, Bld: 185 mg/dL — ABNORMAL HIGH (ref 70–99)
POTASSIUM: 4.4 mmol/L (ref 3.5–5.1)
Sodium: 139 mmol/L (ref 135–145)

## 2018-06-24 LAB — GLUCOSE, CAPILLARY
GLUCOSE-CAPILLARY: 230 mg/dL — AB (ref 70–99)
Glucose-Capillary: 185 mg/dL — ABNORMAL HIGH (ref 70–99)
Glucose-Capillary: 157 mg/dL — ABNORMAL HIGH (ref 70–99)
Glucose-Capillary: 248 mg/dL — ABNORMAL HIGH (ref 70–99)

## 2018-06-24 MED ORDER — GUAIFENESIN-CODEINE 100-10 MG/5ML PO SOLN
10.0000 mL | Freq: Four times a day (QID) | ORAL | Status: DC | PRN
  Administered 2018-06-24: 10 mL via ORAL
  Filled 2018-06-24: qty 10

## 2018-06-24 MED ORDER — BENZONATATE 100 MG PO CAPS
200.0000 mg | ORAL_CAPSULE | Freq: Three times a day (TID) | ORAL | Status: DC
  Administered 2018-06-24 – 2018-06-26 (×8): 200 mg via ORAL
  Filled 2018-06-24 (×8): qty 2

## 2018-06-24 NOTE — Plan of Care (Signed)
  Problem: Education: Goal: Knowledge of General Education information will improve Description: Including pain rating scale, medication(s)/side effects and non-pharmacologic comfort measures Outcome: Progressing   Problem: Cardiac: Goal: Ability to achieve and maintain adequate cardiopulmonary perfusion will improve Outcome: Progressing   

## 2018-06-24 NOTE — Progress Notes (Signed)
[place], [place] 06/24/18  Subjective:   Patient known to our practice from outpatient. He is followed for advanced CKD Presented to the ER from home via EMS with c/o exertional dyspnea, low sats in 80's, cough with sputum Patient's wife reports that he is always short of breath but dyspnea on exertion got worse last few days prior to admission Required biPAP initially during hospitalization Now on [place] O2 supplementation S Creatinine trends reviewed Overall he feels better today.  States he is able to eat good.  Serum creatinine about the same at 3.22 Urine output significantly improved at 3875 cc  Objective:  Vital signs in last 24 hours:  Temp:  [98.1 F (36.7 C)-98.2 F (36.8 C)] 98.1 F (36.7 C) (02/22 0734) Pulse Rate:  [71-82] 71 (02/22 0858) Resp:  [16] 16 (02/21 1618) BP: (138-159)/(52-80) 155/52 (02/22 0734) SpO2:  [87 %-95 %] 95 % (02/22 0858) Weight:  [132.9 kg] 132.9 kg (02/22 0419)  Weight change: -3.674 kg Filed Weights   06/22/18 1457 06/23/18 0438 06/24/18 0419  Weight: (!) 136.6 kg 134.9 kg 132.9 kg    Intake/Output:    Intake/Output Summary (Last 24 hours) at 06/24/2018 1325 Last data filed at 06/24/2018 0847 Gross per 24 hour  Intake 193 ml  Output 4100 ml  Net -3907 ml     Physical Exam: General: Obese gentle man, laying in bed  HEENT Anicteric, moist oral mucus membranes  Neck Short, thick  Pulm/lungs Coarse at bases, mild rhonchi, [place] O2  CVS/Heart No rub  Abdomen:   Soft, obese, nontender  Extremities:  Bilateral 1+ edema  Neurologic:  Alert, oriented  Skin:  No acute rashes    Basic Metabolic Panel:  Recent Labs  Lab 06/21/18 0140 06/21/18 0852 06/22/18 0351 06/23/18 0545 06/24/18 0430  NA 139 138 139 139 139  K 4.2 4.1 4.2 4.6 4.4  CL 107 106 106 106 105  CO2 23 24 24 22 27   GLUCOSE 209* 189* 172* 175* 185*  BUN 49* 50* 50* 57* 61*  CREATININE 3.45* 3.23* 3.28* 3.19* 3.22*  CALCIUM 8.0* 7.9*  8.0* 8.0* 8.1*     CBC: Recent Labs  Lab 06/21/18 0140 06/21/18 0852 06/22/18 0351  WBC 8.1 7.6 8.0  HGB 9.0* 8.8* 9.1*  HCT 28.5* 27.4* 29.0*  MCV 90.2 88.1 90.1  PLT 116* 112* 124*      Lab Results  Component Value Date   HEPBSAG Negative 11/20/2017   HEPBSAB Non Reactive 11/20/2017   HEPBIGM Negative 11/20/2017      Microbiology:  Recent Results (from the past 240 hour(s))  Blood culture (routine x 2)     Status: None (Preliminary result)   Collection Time: 06/21/18  3:31 AM  Result Value Ref Range Status   Specimen Description BLOOD LEFT ANTECUBITAL  Final   Special Requests   Final    BOTTLES DRAWN AEROBIC AND ANAEROBIC Blood Culture results may not be optimal due to an excessive volume of blood received in culture bottles   Culture   Final    NO GROWTH 3 DAYS Performed at [facility], [address] [place], [place] [address]    Report Status PENDING  Incomplete  Blood culture (routine x 2)     Status: None (Preliminary result)   Collection Time: 06/21/18  3:31 AM  Result Value Ref Range Status   Specimen Description BLOOD BLOOD RIGHT FOREARM  Final   Special Requests   Final    BOTTLES  DRAWN AEROBIC AND ANAEROBIC Blood Culture results may not be optimal due to an excessive volume of blood received in culture bottles   Culture   Final    NO GROWTH 3 DAYS Performed at [facility], [address] [place], [place] [address]    Report Status PENDING  Incomplete  MRSA PCR Screening     Status: None   Collection Time: 06/21/18 12:54 PM  Result Value Ref Range Status   MRSA by PCR NEGATIVE NEGATIVE Final    Comment:        The GeneXpert MRSA Assay (FDA approved for NASAL specimens only), is one component of a comprehensive MRSA colonization surveillance program. It is not intended to diagnose MRSA infection nor to guide or monitor treatment for MRSA infections. Performed at [facility], [address]  [place],  [address]     Coagulation Studies: No results for input(s): LABPROT, INR in the last 72 hours.  Urinalysis: No results for input(s): COLORURINE, LABSPEC, PHURINE, GLUCOSEU, HGBUR, BILIRUBINUR, KETONESUR, PROTEINUR, UROBILINOGEN, NITRITE, LEUKOCYTESUR in the last 72 hours.  Invalid input(s): APPERANCEUR    Imaging: No results found.   Medications:    . amLODipine  10 mg Oral Daily  . aspirin  81 mg Oral Daily  . atorvastatin  40 mg Oral Daily  . benzonatate  200 mg Oral TID  . febuxostat  80 mg Oral Daily  . furosemide  40 mg Intravenous BID  . heparin  5,000 Units Subcutaneous Q8H  . hydrALAZINE  10 mg Oral Q8H  . insulin aspart  0-5 Units Subcutaneous QHS  . insulin aspart  0-9 Units Subcutaneous TID WC  . insulin aspart  10 Units Subcutaneous BID WC  . levothyroxine  100 mcg Oral Daily  . metoprolol tartrate  50 mg Oral BID  . pantoprazole  40 mg Oral BID  . sodium chloride flush  3 mL Intravenous Q12H  . sodium chloride flush  3 mL Intravenous Q12H   guaiFENesin-codeine, ipratropium-albuterol, ondansetron **OR** ondansetron (ZOFRAN) IV, sodium chloride flush  Assessment/ Plan:  63 y.o. Caucasian male with longstanding diabetes greater than 30 years, neuropathy, nephropathy, hyperlipidemia, obstructive sleep apnea, obesity, hypertension, hypothyroidism, nonalcoholic fatty liver disease, cirrhosis, diastolic CHF  Chronic kidney disease stage IV Patient has advanced chronic kidney disease due to atherosclerosis, diabetes and hypertension His serum creatinine appears to be close to baseline  Shortness of breath with lower extremity edema Patient was evaluated by critical care/pulmonary service.  He was continued on BiPAP for respiratory distress.  CPAP at night.  Influenza swab is negative.Marland Kitchen  He was also evaluated by cardiologist and recommended IV diuresis 2D echo done this admission.   Currently getting IV furosemide 40 mg twice a day Continue to monitor  daily weight Continue to monitor metabolic panel daily.   We will consider switching to torsemide 40 mg daily in the near future  Nonalcoholic cirrhosis and fatty liver -Probably playing a role in third spacing -Recent albumin level normal at 4.3 in December 2019  Cardiac history.  2D echo 06/23/2018.  LVEF 50 to 55%.  Diastolic dysfunction.  Right ventricle systolic pressure mildly elevated   LOS: 3 Irvan Tiedt 2/22/20201:25 PM  [place], [place]  Note: This note was prepared with Dragon dictation. Any transcription errors are unintentional

## 2018-06-24 NOTE — Progress Notes (Addendum)
Patient complaining of cough and bloody sputum. This RN did observe bloody sputum, patient and this RN made MD aware of this. New orders in by Dr. Posey Pronto. Will administer and continue to monitor patient.   CHF education done, as well as EMMI videos shown to patient and wife at bedside.

## 2018-06-24 NOTE — Progress Notes (Signed)
[facility] [place] NAME: Patrick Hurst    MR#:  [id number]  DATE OF BIRTH:  [birth year]  SUBJECTIVE:  Has some hemoptysis with cough, had good urine output last night 3 L  REVIEW OF SYSTEMS:  CONSTITUTIONAL: No fever, fatigue or weakness.  EYES: No blurred or double vision.  EARS, NOSE, AND THROAT: No tinnitus or ear pain.  RESPIRATORY: Positive cough, shortness of breath, wheezing or positive hemoptysis.  CARDIOVASCULAR: No chest pain, orthopnea, edema.  GASTROINTESTINAL: No nausea, vomiting, diarrhea or abdominal pain.  GENITOURINARY: No dysuria, hematuria.  ENDOCRINE: No polyuria, nocturia,  HEMATOLOGY: No anemia, easy bruising or bleeding SKIN: No rash or lesion. MUSCULOSKELETAL: No joint pain or arthritis.   NEUROLOGIC: No tingling, numbness, weakness.  PSYCHIATRY: No anxiety or depression.   ROS  DRUG ALLERGIES:   Allergies  Allergen Reactions  . Mucinex  [Guaifenesin Er]     Throat swelling, increases heart rate  . Levaquin [Levofloxacin] Palpitations    VITALS:  Blood pressure (!) 155/52, pulse 71, temperature 98.1 F (36.7 C), temperature source Oral, resp. rate 16, height 5\' 11"  (1.803 m), weight 132.9 kg, SpO2 95 %.  PHYSICAL EXAMINATION:  GENERAL:  63 y.o.-year-old patient lying in the bed with no acute distress.  EYES: Pupils equal, round, reactive to light and accommodation. No scleral icterus. Extraocular muscles intact.  HEENT: Head atraumatic, normocephalic. Oropharynx and nasopharynx clear.  NECK:  Supple, no jugular venous distention. No thyroid enlargement, no tenderness.  LUNGS: Normal breath sounds bilaterally, no wheezing, rales,rhonchi or crepitation. No use of accessory muscles of respiration.  CARDIOVASCULAR: S1, S2 normal. No murmurs, rubs, or gallops.  ABDOMEN: Soft, nontender, nondistended. Bowel sounds present. No organomegaly or mass.  EXTREMITIES: No pedal edema, cyanosis, or clubbing.   NEUROLOGIC: Cranial nerves II through XII are intact. Muscle strength 5/5 in all extremities. Sensation intact. Gait not checked.  PSYCHIATRIC: The patient is alert and oriented x 3.  SKIN: No obvious rash, lesion, or ulcer.   Physical Exam LABORATORY PANEL:   CBC Recent Labs  Lab 06/22/18 0351  WBC 8.0  HGB 9.1*  HCT 29.0*  PLT 124*   ------------------------------------------------------------------------------------------------------------------  Chemistries  Recent Labs  Lab 06/24/18 0430  NA 139  K 4.4  CL 105  CO2 27  GLUCOSE 185*  BUN 61*  CREATININE 3.22*  CALCIUM 8.1*   ------------------------------------------------------------------------------------------------------------------  Cardiac Enzymes Recent Labs  Lab 06/21/18 0852 06/21/18 1327  TROPONINI 1.91* 2.04*   ------------------------------------------------------------------------------------------------------------------  RADIOLOGY:  No results found.  ASSESSMENT AND PLAN:  Admitted this morning for shortness of breath, acute on chronic HF, pneumonia, now on BiPAP.  Seen in the emergency room, agree with admitting physician, continue BiPAP support, bronchodilators, antibiotics, diuretics  *Acute on chronic hypoxic respiratory failure  Suspected due to acute on chronic diastolic congestive heart failure exacerbation, extreme morbid obesity/pickwickian state Subsequently weaned off BiPAP, continue supplemental oxygen with weaning as tolerated  Has CPAP at bedtime   *Acute on chronic diastolic congestive heart failure exacerbation  Has diastolic dysfunction on echo Continue IV Lasix   *Chronic OSA Stable  Continue CPAP at bedtime  *CKD stage IV Continue to avoid nephrotoxic agents Nephrology following the patient   *Chronic diabetes mellitus type 2 Stable Continue sliding scale insulin with coverage along with home dose insulin.  Disposition pending clinical course  All the  records are reviewed and case discussed with Care Management/Social Workerr. Management plans discussed with the patient, family and they  are in agreement.  CODE STATUS: full  TOTAL TIME TAKING CARE OF THIS PATIENT: 35 minutes.     POSSIBLE D/C IN 1-2 DAYS, DEPENDING ON CLINICAL CONDITION.   Dustin Flock M.D on 06/24/2018   Between 7am to 6pm - Pager - [phone]  After 6pm go to [email or web address] - password [email or web address]  Office  [phone]  CC: Primary care physician; [name], MD  Note: This dictation was prepared with Dragon dictation along with smaller phrase technology. Any transcriptional errors that result from this process are unintentional.

## 2018-06-25 ENCOUNTER — Inpatient Hospital Stay: Payer: Commercial Managed Care - PPO

## 2018-06-25 LAB — RENAL FUNCTION PANEL
Albumin: 3.4 g/dL — ABNORMAL LOW (ref 3.5–5.0)
Anion gap: 10 (ref 5–15)
BUN: 66 mg/dL — ABNORMAL HIGH (ref 8–23)
CO2: 27 mmol/L (ref 22–32)
CREATININE: 3.18 mg/dL — AB (ref 0.61–1.24)
Calcium: 8.2 mg/dL — ABNORMAL LOW (ref 8.9–10.3)
Chloride: 102 mmol/L (ref 98–111)
GFR calc Af Amer: 23 mL/min — ABNORMAL LOW (ref >60)
GFR calc non Af Amer: 20 mL/min — ABNORMAL LOW (ref >60)
Glucose, Bld: 161 mg/dL — ABNORMAL HIGH (ref 70–99)
Phosphorus: 5.6 mg/dL — ABNORMAL HIGH (ref 2.5–4.6)
Potassium: 4.4 mmol/L (ref 3.5–5.1)
Sodium: 139 mmol/L (ref 135–145)

## 2018-06-25 LAB — GLUCOSE, CAPILLARY
Glucose-Capillary: 173 mg/dL — ABNORMAL HIGH (ref 70–99)
Glucose-Capillary: 146 mg/dL — ABNORMAL HIGH (ref 70–99)
Glucose-Capillary: 167 mg/dL — ABNORMAL HIGH (ref 70–99)
Glucose-Capillary: 283 mg/dL — ABNORMAL HIGH (ref 70–99)

## 2018-06-25 MED ORDER — TORSEMIDE 20 MG PO TABS
40.0000 mg | ORAL_TABLET | Freq: Every day | ORAL | Status: DC
  Administered 2018-06-25 – 2018-06-26 (×2): 40 mg via ORAL
  Filled 2018-06-25 (×2): qty 2

## 2018-06-25 NOTE — Progress Notes (Signed)
[place], [place] 06/25/18  Subjective:   Patient known to our practice from outpatient. He is followed for advanced CKD Presented to the ER from home via EMS with c/o exertional dyspnea, low sats in 80's, cough with sputum Patient's wife reports that he is always short of breath but dyspnea on exertion got worse last few days prior to admission Required biPAP initially during hospitalization Now on [place] O2 supplementation S Creatinine trends reviewed Overall he feels better today.  States he is able to eat good.  Serum creatinine about the same at 3.18 Urine output significantly improved at 4020 cc Responded well to IV Lasix administration.  Overall 12.8 L negative so far this admission  Objective:  Vital signs in last 24 hours:  Temp:  [97.6 F (36.4 C)-98.6 F (37 C)] 97.7 F (36.5 C) (02/23 1615) Pulse Rate:  [67-76] 67 (02/23 1615) Resp:  [20] 20 (02/23 0340) BP: (122-155)/(53-102) 155/79 (02/23 1615) SpO2:  [90 %-96 %] 96 % (02/23 1615) Weight:  [130.4 kg] 130.4 kg (02/23 0340)  Weight change: -2.586 kg Filed Weights   06/23/18 0438 06/24/18 0419 06/25/18 0340  Weight: 134.9 kg 132.9 kg 130.4 kg    Intake/Output:    Intake/Output Summary (Last 24 hours) at 06/25/2018 1629 Last data filed at 06/25/2018 1610 Gross per 24 hour  Intake 3 ml  Output 3995 ml  Net -3992 ml     Physical Exam: General: Obese gentle man, laying in bed  HEENT Anicteric, moist oral mucus membranes  Neck Short, thick  Pulm/lungs  clear to auscultation, [place] O2  CVS/Heart  No rub  Abdomen:   Soft, obese, nontender  Extremities:  Bilateral trace edema  Neurologic:  Alert, oriented  Skin:  No acute rashes    Basic Metabolic Panel:  Recent Labs  Lab 06/21/18 0852 06/22/18 0351 06/23/18 0545 06/24/18 0430 06/25/18 0332  NA 138 139 139 139 139  K 4.1 4.2 4.6 4.4 4.4  CL 106 106 106 105 102  CO2 24 24 22 27 27   GLUCOSE 189* 172* 175* 185* 161*  BUN 50*  50* 57* 61* 66*  CREATININE 3.23* 3.28* 3.19* 3.22* 3.18*  CALCIUM 7.9* 8.0* 8.0* 8.1* 8.2*  PHOS  --   --   --   --  5.6*     CBC: Recent Labs  Lab 06/21/18 0140 06/21/18 0852 06/22/18 0351  WBC 8.1 7.6 8.0  HGB 9.0* 8.8* 9.1*  HCT 28.5* 27.4* 29.0*  MCV 90.2 88.1 90.1  PLT 116* 112* 124*      Lab Results  Component Value Date   HEPBSAG Negative 11/20/2017   HEPBSAB Non Reactive 11/20/2017   HEPBIGM Negative 11/20/2017      Microbiology:  Recent Results (from the past 240 hour(s))  Blood culture (routine x 2)     Status: None (Preliminary result)   Collection Time: 06/21/18  3:31 AM  Result Value Ref Range Status   Specimen Description BLOOD LEFT ANTECUBITAL  Final   Special Requests   Final    BOTTLES DRAWN AEROBIC AND ANAEROBIC Blood Culture results may not be optimal due to an excessive volume of blood received in culture bottles   Culture   Final    NO GROWTH 4 DAYS Performed at [facility], [address] [place], [place] [address]    Report Status PENDING  Incomplete  Blood culture (routine x 2)     Status: None (Preliminary result)   Collection Time: 06/21/18  3:31 AM  Result Value Ref Range Status   Specimen Description BLOOD BLOOD RIGHT FOREARM  Final   Special Requests   Final    BOTTLES DRAWN AEROBIC AND ANAEROBIC Blood Culture results may not be optimal due to an excessive volume of blood received in culture bottles   Culture   Final    NO GROWTH 4 DAYS Performed at [facility], [address], [place], [place] [address]    Report Status PENDING  Incomplete  MRSA PCR Screening     Status: None   Collection Time: 06/21/18 12:54 PM  Result Value Ref Range Status   MRSA by PCR NEGATIVE NEGATIVE Final    Comment:        The GeneXpert MRSA Assay (FDA approved for NASAL specimens only), is one component of a comprehensive MRSA colonization surveillance program. It is not intended to diagnose MRSA infection nor to guide  or monitor treatment for MRSA infections. Performed at [facility], [address] [place], [place] [address]     Coagulation Studies: No results for input(s): LABPROT, INR in the last 72 hours.  Urinalysis: No results for input(s): COLORURINE, LABSPEC, PHURINE, GLUCOSEU, HGBUR, BILIRUBINUR, KETONESUR, PROTEINUR, UROBILINOGEN, NITRITE, LEUKOCYTESUR in the last 72 hours.  Invalid input(s): APPERANCEUR    Imaging: Dg Chest 2 View  Result Date: 06/25/2018 CLINICAL DATA:  Shortness of breath. EXAM: CHEST - 2 VIEW COMPARISON:  06/22/2018 FINDINGS: Mildly enlarged cardiac silhouette. Mediastinal contours appear intact. There is no evidence of focal airspace consolidation, pleural effusion or pneumothorax. Osseous structures are without acute abnormality. Soft tissues are grossly normal. IMPRESSION: 1. Mildly enlarged cardiac silhouette. 2. No evidence of focal airspace consolidation or edema. Electronically Signed   By: Fidela Salisbury M.D.   On: 06/25/2018 15:03     Medications:    . amLODipine  10 mg Oral Daily  . aspirin  81 mg Oral Daily  . atorvastatin  40 mg Oral Daily  . benzonatate  200 mg Oral TID  . febuxostat  80 mg Oral Daily  . heparin  5,000 Units Subcutaneous Q8H  . hydrALAZINE  10 mg Oral Q8H  . insulin aspart  0-5 Units Subcutaneous QHS  . insulin aspart  0-9 Units Subcutaneous TID WC  . insulin aspart  10 Units Subcutaneous BID WC  . levothyroxine  100 mcg Oral Daily  . metoprolol tartrate  50 mg Oral BID  . pantoprazole  40 mg Oral BID  . sodium chloride flush  3 mL Intravenous Q12H  . sodium chloride flush  3 mL Intravenous Q12H  . torsemide  40 mg Oral Daily   guaiFENesin-codeine, ipratropium-albuterol, ondansetron **OR** ondansetron (ZOFRAN) IV, sodium chloride flush  Assessment/ Plan:  63 y.o. Caucasian male with longstanding diabetes greater than 30 years, neuropathy, nephropathy, hyperlipidemia, obstructive sleep apnea, obesity,  hypertension, hypothyroidism, nonalcoholic fatty liver disease, cirrhosis, diastolic CHF  Chronic kidney disease stage IV Patient has advanced chronic kidney disease due to atherosclerosis, diabetes and hypertension His serum creatinine appears to be close to baseline  Shortness of breath with lower extremity edema Patient was evaluated by critical care/pulmonary service.  He was continued on BiPAP for respiratory distress.  CPAP at night.  Influenza swab is negative.Marland Kitchen  He was also evaluated by cardiologist and recommended IV diuresis 2D echo done this admission.   Currently getting IV furosemide 40 mg twice a day -> changed to oral torsemide as he appears to have achieved good volume status Continue to monitor daily  weight Continue to monitor metabolic panel daily.   -Continued hypoxia despite diuresis.  Primary team considering VQ scan  Nonalcoholic cirrhosis and fatty liver -Probably playing a role in third spacing -Recent albumin level normal at 4.3 in December 2019  Cardiac history.  2D echo 06/23/2018.  LVEF 50 to 55%.  Diastolic dysfunction.  Right ventricle systolic pressure mildly elevated   LOS: 4 Wendi Lastra 2/23/20204:29 PM  [place]  Note: This note was prepared with Dragon dictation. Any transcription errors are unintentional

## 2018-06-25 NOTE — Progress Notes (Signed)
[facility] [place] NAME: Fadel Clason    MR#:  [id number]  DATE OF BIRTH:  03-05-1956  SUBJECTIVE:  Again had a very good urine output.  Continues to be hypoxic with exertion REVIEW OF SYSTEMS:  CONSTITUTIONAL: No fever, fatigue or weakness.  EYES: No blurred or double vision.  EARS, NOSE, AND THROAT: No tinnitus or ear pain.  RESPIRATORY: Positive cough, shortness of breath, wheezing or positive hemoptysis.  CARDIOVASCULAR: No chest pain, orthopnea, edema.  GASTROINTESTINAL: No nausea, vomiting, diarrhea or abdominal pain.  GENITOURINARY: No dysuria, hematuria.  ENDOCRINE: No polyuria, nocturia,  HEMATOLOGY: No anemia, easy bruising or bleeding SKIN: No rash or lesion. MUSCULOSKELETAL: No joint pain or arthritis.   NEUROLOGIC: No tingling, numbness, weakness.  PSYCHIATRY: No anxiety or depression.   ROS  DRUG ALLERGIES:   Allergies  Allergen Reactions  . Mucinex  [Guaifenesin Er]     Throat swelling, increases heart rate  . Levaquin [Levofloxacin] Palpitations    VITALS:  Blood pressure 122/75, pulse 76, temperature 98.5 F (36.9 C), temperature source Oral, resp. rate 20, height 5\' 11"  (1.803 m), weight 130.4 kg, SpO2 90 %.  PHYSICAL EXAMINATION:  GENERAL:  63 y.o.-year-old patient lying in the bed with no acute distress.  EYES: Pupils equal, round, reactive to light and accommodation. No scleral icterus. Extraocular muscles intact.  HEENT: Head atraumatic, normocephalic. Oropharynx and nasopharynx clear.  NECK:  Supple, no jugular venous distention. No thyroid enlargement, no tenderness.  LUNGS: Normal breath sounds bilaterally, no wheezing, rales,rhonchi or crepitation. No use of accessory muscles of respiration.  CARDIOVASCULAR: S1, S2 normal. No murmurs, rubs, or gallops.  ABDOMEN: Soft, nontender, nondistended. Bowel sounds present. No organomegaly or mass.  EXTREMITIES: No pedal edema, cyanosis, or clubbing.   NEUROLOGIC: Cranial nerves II through XII are intact. Muscle strength 5/5 in all extremities. Sensation intact. Gait not checked.  PSYCHIATRIC: The patient is alert and oriented x 3.  SKIN: No obvious rash, lesion, or ulcer.   Physical Exam LABORATORY PANEL:   CBC Recent Labs  Lab 06/22/18 0351  WBC 8.0  HGB 9.1*  HCT 29.0*  PLT 124*   ------------------------------------------------------------------------------------------------------------------  Chemistries  Recent Labs  Lab 06/25/18 0332  NA 139  K 4.4  CL 102  CO2 27  GLUCOSE 161*  BUN 66*  CREATININE 3.18*  CALCIUM 8.2*   ------------------------------------------------------------------------------------------------------------------  Cardiac Enzymes Recent Labs  Lab 06/21/18 0852 06/21/18 1327  TROPONINI 1.91* 2.04*   ------------------------------------------------------------------------------------------------------------------  RADIOLOGY:  No results found.  ASSESSMENT AND PLAN:  Admitted this morning for shortness of breath, acute on chronic HF, pneumonia, now on BiPAP.  Seen in the emergency room, agree with admitting physician, continue BiPAP support, bronchodilators, antibiotics, diuretics  *Acute on chronic hypoxic respiratory failure  Suspected due to acute on chronic diastolic congestive heart failure exacerbation, extreme morbid obesity/pickwickian state Discussed with pulmonary Dr. Jamal Collin recommends a VQ scan due to hypoxia despite diuresis  *Acute on chronic diastolic congestive heart failure exacerbation  Has diastolic dysfunction on echo Change to oral Lasix per nephrology   *Chronic OSA Stable  Continue CPAP at bedtime  *CKD stage IV-renal function stable Continue to avoid nephrotoxic agents Nephrology following the patient   *Chronic diabetes mellitus type 2 Stable Continue sliding scale insulin with coverage along with home dose insulin.  Disposition pending clinical  course  All the records are reviewed and case discussed with Care Management/Social Workerr. Management plans discussed with the patient, family and  they are in agreement.  CODE STATUS: full  TOTAL TIME TAKING CARE OF THIS PATIENT: 35 minutes.     POSSIBLE D/C IN 1-2 DAYS, DEPENDING ON CLINICAL CONDITION.   Dustin Flock M.D on 06/25/2018   Between 7am to 6pm - Pager - [phone]  After 6pm go to [email or web address] - password [email or web address] [facility]  Office  [phone]  CC: Primary care physician; [name], MD  Note: This dictation was prepared with Dragon dictation along with smaller phrase technology. Any transcriptional errors that result from this process are unintentional.

## 2018-06-25 NOTE — Progress Notes (Signed)
Pt OOB in chair on RA.  SpO2 dropped to 88%.  Reapplied O2 at 2 L/min via [place].  SpO2 up to 94%.

## 2018-06-25 NOTE — Plan of Care (Signed)
  Problem: Education: Goal: Knowledge of General Education information will improve Description Including pain rating scale, medication(s)/side effects and non-pharmacologic comfort measures Outcome: Progressing Note:  Pt had reviewed CHF videos and pt education packet.  Pt and spouse asking appropriate questions.  Understanding verified through teach back.   Problem: Health Behavior/Discharge Planning: Goal: Ability to manage health-related needs will improve Outcome: Progressing Note:  Pt and spouse expressing positive comments about implementing the necessary lifestyle changes to manage CHF at home.   Problem: Clinical Measurements: Goal: Ability to maintain clinical measurements within normal limits will improve Outcome: Progressing Goal: Will remain free from infection Outcome: Progressing Goal: Diagnostic test results will improve Outcome: Progressing Goal: Respiratory complications will improve Outcome: Progressing Note:  Pt is tolerating periods of RA while OOB in chair.  O2 reapplied at 2L/min via [place] PRN. Goal: Cardiovascular complication will be avoided Outcome: Progressing   Problem: Activity: Goal: Risk for activity intolerance will decrease Outcome: Progressing   Problem: Nutrition: Goal: Adequate nutrition will be maintained Outcome: Progressing   Problem: Coping: Goal: Level of anxiety will decrease Outcome: Progressing   Problem: Elimination: Goal: Will not experience complications related to bowel motility Outcome: Progressing Goal: Will not experience complications related to urinary retention Outcome: Progressing   Problem: Pain Managment: Goal: General experience of comfort will improve Outcome: Progressing   Problem: Safety: Goal: Ability to remain free from injury will improve Outcome: Progressing Note:  Pt remains free of injuries.   Problem: Skin Integrity: Goal: Risk for impaired skin integrity will decrease Outcome: Progressing    Problem: Education: Goal: Ability to demonstrate management of disease process will improve Outcome: Progressing Goal: Ability to verbalize understanding of medication therapies will improve Outcome: Progressing Goal: Individualized Educational Video(s) Outcome: Progressing   Problem: Activity: Goal: Capacity to carry out activities will improve Outcome: Progressing   Problem: Cardiac: Goal: Ability to achieve and maintain adequate cardiopulmonary perfusion will improve Outcome: Progressing

## 2018-06-26 ENCOUNTER — Inpatient Hospital Stay: Payer: Commercial Managed Care - PPO

## 2018-06-26 LAB — BASIC METABOLIC PANEL
Anion gap: 13 (ref 5–15)
BUN: 76 mg/dL — ABNORMAL HIGH (ref 8–23)
CO2: 26 mmol/L (ref 22–32)
CREATININE: 3.39 mg/dL — AB (ref 0.61–1.24)
Calcium: 8 mg/dL — ABNORMAL LOW (ref 8.9–10.3)
Chloride: 98 mmol/L (ref 98–111)
GFR calc Af Amer: 21 mL/min — ABNORMAL LOW (ref >60)
GFR calc non Af Amer: 18 mL/min — ABNORMAL LOW (ref >60)
Glucose, Bld: 219 mg/dL — ABNORMAL HIGH (ref 70–99)
Potassium: 4.1 mmol/L (ref 3.5–5.1)
Sodium: 137 mmol/L (ref 135–145)

## 2018-06-26 LAB — CULTURE, BLOOD (ROUTINE X 2)
CULTURE: NO GROWTH
CULTURE: NO GROWTH

## 2018-06-26 LAB — GLUCOSE, CAPILLARY
GLUCOSE-CAPILLARY: 160 mg/dL — AB (ref 70–99)
Glucose-Capillary: 180 mg/dL — ABNORMAL HIGH (ref 70–99)

## 2018-06-26 MED ORDER — IPRATROPIUM-ALBUTEROL 0.5-2.5 (3) MG/3ML IN SOLN: 3.0000 mL | RESPIRATORY_TRACT | 2 refills | Status: DC | PRN

## 2018-06-26 MED ORDER — GUAIFENESIN-CODEINE 100-10 MG/5ML PO SOLN: 10.0000 mL | Freq: Four times a day (QID) | ORAL | 0 refills | Status: DC | PRN

## 2018-06-26 MED ORDER — TECHNETIUM TO 99M ALBUMIN AGGREGATED
4.2400 | Freq: Once | INTRAVENOUS | Status: AC | PRN
  Administered 2018-06-26: 4.24 via INTRAVENOUS

## 2018-06-26 MED ORDER — TECHNETIUM TC 99M DIETHYLENETRIAME-PENTAACETIC ACID
33.1500 | Freq: Once | INTRAVENOUS | Status: AC | PRN
  Administered 2018-06-26: 33.15 via INTRAVENOUS

## 2018-06-26 MED ORDER — TORSEMIDE 20 MG PO TABS: 40.0000 mg | ORAL_TABLET | Freq: Every day | ORAL | 0 refills | Status: DC

## 2018-06-26 NOTE — Plan of Care (Signed)
  Problem: Education: Goal: Knowledge of General Education information will improve Description Including pain rating scale, medication(s)/side effects and non-pharmacologic comfort measures Outcome: Progressing   Problem: Clinical Measurements: Goal: Respiratory complications will improve Outcome: Not Progressing   Problem: Activity: Goal: Risk for activity intolerance will decrease Outcome: Not Progressing

## 2018-06-26 NOTE — Discharge Instructions (Signed)
Shortness of Breath, Adult  Shortness of breath is when a person has trouble breathing enough air or when a person feels like she or he is having trouble breathing in enough air. Shortness of breath could be a sign of a medical problem.  Follow these instructions at home:     Pay attention to any changes in your symptoms.   Do not use any products that contain nicotine or tobacco, such as cigarettes, e-cigarettes, and chewing tobacco.   Do not smoke. Smoking is a common cause of shortness of breath. If you need help quitting, ask your health care provider.   Avoid things that can irritate your airways, such as:  ? Mold.  ? Dust.  ? Air pollution.  ? Chemical fumes.  ? Things that can cause allergy symptoms (allergens), if you have allergies.   Keep your living space clean and free of mold and dust.   Rest as needed. Slowly return to your usual activities.   Take over-the-counter and prescription medicines only as told by your health care provider. This includes oxygen therapy and inhaled medicines.   Keep all follow-up visits as told by your health care provider. This is important.  Contact a health care provider if:   Your condition does not improve as soon as expected.   You have a hard time doing your normal activities, even after you rest.   You have new symptoms.  Get help right away if:   Your shortness of breath gets worse.   You have shortness of breath when you are resting.   You feel light-headed or you faint.   You have a cough that is not controlled with medicines.   You cough up blood.   You have pain with breathing.   You have pain in your chest, arms, shoulders, or abdomen.   You have a fever.   You cannot walk up stairs or exercise the way that you normally do.  These symptoms may represent a serious problem that is an emergency. Do not wait to see if the symptoms will go away. Get medical help right away. Call your local emergency services (911 in the [place] Do not drive yourself  to the hospital.  Summary   Shortness of breath is when a person has trouble breathing enough air. It can be a sign of a medical problem.   Avoid things that irritate your lungs, such as smoking, pollution, mold, and dust.   Pay attention to changes in your symptoms and contact your health care provider if you have a hard time completing daily activities because of shortness of breath.  This information is not intended to replace advice given to you by your health care provider. Make sure you discuss any questions you have with your health care provider.  Document Released: 01/12/2001 Document Revised: 09/19/2017 Document Reviewed: 09/19/2017  Elsevier Interactive Patient Education  2019 [employer]

## 2018-06-26 NOTE — Progress Notes (Signed)
SATURATION QUALIFICATIONS: (This note is used to comply with regulatory documentation for home oxygen)  Patient Saturations on Room Air at Rest = 90%  Patient Saturations on Room Air while Ambulating = 86%  Patient Saturations on 2 Liters of oxygen while Ambulating = 94%  Please briefly explain why patient needs home oxygen: N/A  Daron Offer

## 2018-06-26 NOTE — Progress Notes (Signed)
* [facility]     Assessment and Plan:  Acute hypoxic respiratory failure, likely multifactorial from diastolic dysfunction, stage IV CKD, fatty liver disease, deconditioning and obesity.  -Patient appears to be doing/feeling better. -Current oxygen saturation 90% on room air.  Advance activity as tolerated, patient may require home oxygen. - Discuss further outpatient management, patient given my card asked to follow-up in regards to further oxygen therapy or discontinuation.  Continue diuresis as tolerated.  Patient is going to be following up at the heart failure clinic.  Date: 06/26/2018  MRN# [id number] Patrick Hurst 10-14-1955   Patrick Hurst is a 63 y.o. old male seen in follow up for chief complaint of  Chief Complaint  Patient presents with  . Shortness of Breath     HPI:  The patient is a 63 year old male with a history of chronic exertional dyspnea which has been considerably worse over the past few weeks possibly due to pneumonia.  He also has a history of obstructive sleep apnea on CPAP.  He was seen by Dr. Jamal Collin on 2/19, thought to likely have pulmonary edema.  Patient has stage IV CKD followed by nephrology, nonalcoholic fatty liver disease, diastolic CHF, cirrhosis.. A VQ scan was subsequently ordered.  Currently patient's oxygen saturation 93% on 2 L nasal cannula.  **VQ scan 06/26/2018>> images and radiology report personally viewed, no evidence of perfusion defects. **Chest x-ray 06/25/2018>> imaging personally reviewed, there is mild bilateral interstitial changes, otherwise lungs are unremarkable.  Medication:    Current Facility-Administered Medications:  .  amLODipine (NORVASC) tablet 10 mg, 10 mg, Oral, Daily, Lance Coon, MD, 10 mg at 06/26/18 0854 .  aspirin chewable tablet 81 mg, 81 mg, Oral, Daily, Lance Coon, MD, 81 mg at 06/26/18 0855 .  atorvastatin (LIPITOR) tablet 40 mg, 40 mg, Oral, Daily, Lance Coon, MD, 40 mg at  06/26/18 0855 .  benzonatate (TESSALON) capsule 200 mg, 200 mg, Oral, TID, Dustin Flock, MD, 200 mg at 06/26/18 0855 .  febuxostat (ULORIC) tablet 80 mg, 80 mg, Oral, Daily, Epifanio Lesches, MD, 80 mg at 06/26/18 0856 .  guaiFENesin-codeine 100-10 MG/5ML solution 10 mL, 10 mL, Oral, Q6H PRN, Dustin Flock, MD, 10 mL at 06/24/18 1113 .  heparin injection 5,000 Units, 5,000 Units, Subcutaneous, Q8H, Lance Coon, MD, 5,000 Units at 06/26/18 [phone] .  hydrALAZINE (APRESOLINE) tablet 10 mg, 10 mg, Oral, Q8H, Lance Coon, MD, 10 mg at 06/26/18 0855 .  insulin aspart (novoLOG) injection 0-5 Units, 0-5 Units, Subcutaneous, Corwin Levins, MD, 3 Units at 06/25/18 2247 .  insulin aspart (novoLOG) injection 0-9 Units, 0-9 Units, Subcutaneous, TID WC, Lance Coon, MD, 2 Units at 06/26/18 [phone] .  insulin aspart (novoLOG) injection 10 Units, 10 Units, Subcutaneous, BID WC, Lance Coon, MD, 10 Units at 06/26/18 [phone] .  ipratropium-albuterol (DUONEB) 0.5-2.5 (3) MG/3ML nebulizer solution 3 mL, 3 mL, Nebulization, Q4H PRN, Lance Coon, MD .  levothyroxine (SYNTHROID, LEVOTHROID) tablet 100 mcg, 100 mcg, Oral, Daily, Lance Coon, MD, 100 mcg at 06/26/18 0558 .  metoprolol tartrate (LOPRESSOR) tablet 50 mg, 50 mg, Oral, BID, Lance Coon, MD, 50 mg at 06/26/18 0854 .  ondansetron (ZOFRAN) tablet 4 mg, 4 mg, Oral, Q6H PRN **OR** ondansetron (ZOFRAN) injection 4 mg, 4 mg, Intravenous, Q6H PRN, Lance Coon, MD .  pantoprazole (PROTONIX) EC tablet 40 mg, 40 mg, Oral, BID, Lance Coon, MD, 40 mg at 06/26/18 0854 .  sodium chloride flush (NS) 0.9 % injection 3 mL, 3  mL, Intravenous, Q12H, Salary, Montell D, MD, 3 mL at 06/26/18 0854 .  sodium chloride flush (NS) 0.9 % injection 3 mL, 3 mL, Intravenous, PRN, Salary, Montell D, MD .  sodium chloride flush (NS) 0.9 % injection 3 mL, 3 mL, Intravenous, Q12H, Salary, Montell D, MD, 3 mL at 06/26/18 0857 .  torsemide (DEMADEX) tablet 40 mg, 40 mg,  Oral, Daily, Murlean Iba, MD, 40 mg at 06/26/18 0855   Allergies:  Mucinex  [guaifenesin er] and Levaquin [levofloxacin]      LABORATORY PANEL:   CBC Recent Labs  Lab 06/22/18 0351  WBC 8.0  HGB 9.1*  HCT 29.0*  PLT 124*   ------------------------------------------------------------------------------------------------------------------  Chemistries  Recent Labs  Lab 06/26/18 0519  NA 137  K 4.1  CL 98  CO2 26  GLUCOSE 219*  BUN 76*  CREATININE 3.39*  CALCIUM 8.0*   ------------------------------------------------------------------------------------------------------------------  Cardiac Enzymes Recent Labs  Lab 06/21/18 1327  TROPONINI 2.04*   ------------------------------------------------------------  RADIOLOGY:   No results found for this or any previous visit. Results for orders placed during the hospital encounter of 06/21/18  DG Chest 2 View   Narrative CLINICAL DATA:  Shortness of breath.  EXAM: CHEST - 2 VIEW  COMPARISON:  06/22/2018  FINDINGS: Mildly enlarged cardiac silhouette. Mediastinal contours appear intact.  There is no evidence of focal airspace consolidation, pleural effusion or pneumothorax.  Osseous structures are without acute abnormality. Soft tissues are grossly normal.  IMPRESSION: 1. Mildly enlarged cardiac silhouette. 2. No evidence of focal airspace consolidation or edema.   Electronically Signed   By: Fidela Salisbury M.D.   On: 06/25/2018 15:03    ------------------------------------------------------------------------------------------------------------------  Thank  you for allowing [facility] to assist in the care of your patient. Our recommendations are noted above.  Please contact us if we can be of further service.   Marda Stalker, M.D., F.C.C.P.  Board Certified in Internal Medicine, Pulmonary Medicine, [place], and Sleep Medicine.  [facility] [facility] Office Number: [phone]  06/26/2018

## 2018-06-26 NOTE — Progress Notes (Signed)
[facility], [place] 06/26/18  Subjective:  Patient still having shortness of breath especially upon exertion. Oxygen saturation fell below 88% upon ambulation. Still has lower extremity edema.   Objective:  Vital signs in last 24 hours:  Temp:  [97.7 F (36.5 C)-98.4 F (36.9 C)] 98.4 F (36.9 C) (02/24 0754) Pulse Rate:  [67-74] 74 (02/24 0754) Resp:  [18-20] 20 (02/24 0754) BP: (126-155)/(58-79) 134/75 (02/24 0754) SpO2:  [91 %-100 %] 93 % (02/24 0754) Weight:  [130.9 kg] 130.9 kg (02/24 0423)  Weight change: 0.544 kg Filed Weights   06/24/18 0419 06/25/18 0340 06/26/18 0423  Weight: 132.9 kg 130.4 kg 130.9 kg    Intake/Output:    Intake/Output Summary (Last 24 hours) at 06/26/2018 1424 Last data filed at 06/26/2018 1341 Gross per 24 hour  Intake 843 ml  Output 2600 ml  Net -1757 ml     Physical Exam: General: Obese gentle man, sittin gup  HEENT Anicteric, moist oral mucus membranes  Neck Short, thick  Pulm/lungs  clear to auscultation, [place] O2  CVS/Heart  No rub  Abdomen:   Soft, obese, nontender  Extremities:  Bilateral 1+ LE edema  Neurologic:  Alert, oriented  Skin:  No acute rashes    Basic Metabolic Panel:  Recent Labs  Lab 06/22/18 0351 06/23/18 0545 06/24/18 0430 06/25/18 0332 06/26/18 0519  NA 139 139 139 139 137  K 4.2 4.6 4.4 4.4 4.1  CL 106 106 105 102 98  CO2 24 22 27 27 26   GLUCOSE 172* 175* 185* 161* 219*  BUN 50* 57* 61* 66* 76*  CREATININE 3.28* 3.19* 3.22* 3.18* 3.39*  CALCIUM 8.0* 8.0* 8.1* 8.2* 8.0*  PHOS  --   --   --  5.6*  --      CBC: Recent Labs  Lab 06/21/18 0140 06/21/18 0852 06/22/18 0351  WBC 8.1 7.6 8.0  HGB 9.0* 8.8* 9.1*  HCT 28.5* 27.4* 29.0*  MCV 90.2 88.1 90.1  PLT 116* 112* 124*      Lab Results  Component Value Date   HEPBSAG Negative 11/20/2017   HEPBSAB Non Reactive 11/20/2017   HEPBIGM Negative 11/20/2017      Microbiology:  Recent Results (from the past  240 hour(s))  Blood culture (routine x 2)     Status: None   Collection Time: 06/21/18  3:31 AM  Result Value Ref Range Status   Specimen Description BLOOD LEFT ANTECUBITAL  Final   Special Requests   Final    BOTTLES DRAWN AEROBIC AND ANAEROBIC Blood Culture results may not be optimal due to an excessive volume of blood received in culture bottles   Culture   Final    NO GROWTH 5 DAYS Performed at [facility], [address] [place], [place] [address]    Report Status 06/26/2018 FINAL  Final  Blood culture (routine x 2)     Status: None   Collection Time: 06/21/18  3:31 AM  Result Value Ref Range Status   Specimen Description BLOOD BLOOD RIGHT FOREARM  Final   Special Requests   Final    BOTTLES DRAWN AEROBIC AND ANAEROBIC Blood Culture results may not be optimal due to an excessive volume of blood received in culture bottles   Culture   Final    NO GROWTH 5 DAYS Performed at [facility], [address] [place], [place] [address]    Report Status 06/26/2018 FINAL  Final  MRSA PCR Screening  Status: None   Collection Time: 06/21/18 12:54 PM  Result Value Ref Range Status   MRSA by PCR NEGATIVE NEGATIVE Final    Comment:        The GeneXpert MRSA Assay (FDA approved for NASAL specimens only), is one component of a comprehensive MRSA colonization surveillance program. It is not intended to diagnose MRSA infection nor to guide or monitor treatment for MRSA infections. Performed at [facility], [address] [place], [place] [address]     Coagulation Studies: No results for input(s): LABPROT, INR in the last 72 hours.  Urinalysis: No results for input(s): COLORURINE, LABSPEC, PHURINE, GLUCOSEU, HGBUR, BILIRUBINUR, KETONESUR, PROTEINUR, UROBILINOGEN, NITRITE, LEUKOCYTESUR in the last 72 hours.  Invalid input(s): APPERANCEUR    Imaging: Dg Chest 2 View  Result Date: 06/25/2018 CLINICAL DATA:  Shortness of breath. EXAM: CHEST - 2  VIEW COMPARISON:  06/22/2018 FINDINGS: Mildly enlarged cardiac silhouette. Mediastinal contours appear intact. There is no evidence of focal airspace consolidation, pleural effusion or pneumothorax. Osseous structures are without acute abnormality. Soft tissues are grossly normal. IMPRESSION: 1. Mildly enlarged cardiac silhouette. 2. No evidence of focal airspace consolidation or edema. Electronically Signed   By: Fidela Salisbury M.D.   On: 06/25/2018 15:03   Nm Pulmonary Perf And Vent  Result Date: 06/26/2018 CLINICAL DATA:  Shortness of Breath EXAM: NUCLEAR MEDICINE VENTILATION - PERFUSION LUNG SCAN VIEWS: Anterior, posterior, left lateral, right lateral, RPO, LPO, RAO, LAO-ventilation and perfusion Radionuclide: 33.15 mCi of Tc-38m DTPA aerosol inhalation and 4.24 mCi Tc34m MAA IV COMPARISON:  Chest radiograph June 25, 2018. FINDINGS: Ventilation: Radiotracer uptake is homogeneous and symmetric bilaterally. No ventilation defects are appreciable. Perfusion: Radiotracer uptake is homogeneous and symmetric bilaterally. No perfusion defects are appreciable. IMPRESSION: No appreciable ventilation or perfusion defects. Very low probability of pulmonary embolus. Electronically Signed   By: Lowella Grip III M.D.   On: 06/26/2018 12:18     Medications:    . amLODipine  10 mg Oral Daily  . aspirin  81 mg Oral Daily  . atorvastatin  40 mg Oral Daily  . benzonatate  200 mg Oral TID  . febuxostat  80 mg Oral Daily  . heparin  5,000 Units Subcutaneous Q8H  . hydrALAZINE  10 mg Oral Q8H  . insulin aspart  0-5 Units Subcutaneous QHS  . insulin aspart  0-9 Units Subcutaneous TID WC  . insulin aspart  10 Units Subcutaneous BID WC  . levothyroxine  100 mcg Oral Daily  . metoprolol tartrate  50 mg Oral BID  . pantoprazole  40 mg Oral BID  . sodium chloride flush  3 mL Intravenous Q12H  . sodium chloride flush  3 mL Intravenous Q12H  . torsemide  40 mg Oral Daily   guaiFENesin-codeine,  ipratropium-albuterol, ondansetron **OR** ondansetron (ZOFRAN) IV, sodium chloride flush  Assessment/ Plan:  63 y.o. Caucasian male with longstanding diabetes greater than 30 years, neuropathy, nephropathy, hyperlipidemia, obstructive sleep apnea, obesity, hypertension, hypothyroidism, nonalcoholic fatty liver disease, cirrhosis, diastolic CHF  Chronic kidney disease stage IV Patient has advanced chronic kidney disease due to atherosclerosis, diabetes and hypertension - continue to monitor renal parameters closely while the patient remains on diuretic treatment.  Shortness of breath with lower extremity edema Patient was evaluated by critical care/pulmonary service.  He was continued on BiPAP for respiratory distress.  CPAP at night.  Influenza swab is negative.  Patient underwent diuresis with IV lasix.  Now transitioned back to torsemide.    Nonalcoholic cirrhosis  and fatty liver -Probably playing a role in third spacing -Recent albumin level normal at 4.3 in December 2019  Cardiac history.  2D echo 06/23/2018.  LVEF 50 to 55%.  Diastolic dysfunction.  Right ventricle systolic pressure mildly elevated   LOS: 5 Orabelle Rylee 2/24/20202:24 PM  [place], [place]  Note: This note was prepared with Dragon dictation. Any transcription errors are unintentional

## 2018-06-26 NOTE — Care Management Note (Signed)
Case Management Note  Patient Details  Name: CLOYDE OREGEL MRN: [id number] Date of Birth: 06-05-1955  Subjective/Objective:       Patient to DC today to home.   [facility] to bring oxygen to room for DC.  No further needs identified at this time.                Action/Plan:   Expected Discharge Date:  06/26/18               Expected Discharge Plan:  Home/Self Care  In-House Referral:     Discharge planning Services     Post Acute Care Choice:    Choice offered to:     DME Arranged:  Oxygen DME Agency:  [place]:    [facility] Agency:     Status of Service:  Completed, signed off  If discussed at [place] of Stay Meetings, dates discussed:    Additional Comments:  Elza Rafter, RN 06/26/2018, 2:31 PM

## 2018-06-26 NOTE — Discharge Summary (Signed)
[place] at [facility], 63 y.o., DOB 06-02-1955, MRN [id number]. 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 male who presents with chief complaint as above.  Patient presents to the ED with several days of progressive shortness of breath.  He states that he saw his outpatient physician who recommended he double his diuretic dose on the suspicion that it was his heart failure causing his shortness of breath.  He did so for the last 5 or 6 days, but his dyspnea has progressed.  Today he was extremely short of breath and came to the ED for evaluation.  Initially there was some concern for pneumonia.  He had greater than 12 L of urine output.  Despite this he continued to have hypoxia.  He will require oxygen therapy for CHF.  Patient had a VQ scan which was low probability.  He will need outpatient follow-up with pulmonary to do further pulmonary function test.          Consults  pulmonary/intensive care  Significant Tests:  See full reports for all details     Dg Chest 2 View  Result Date: 06/25/2018 CLINICAL DATA:  Shortness of breath. EXAM: CHEST - 2 VIEW COMPARISON:  06/22/2018 FINDINGS: Mildly enlarged cardiac silhouette. Mediastinal contours appear intact. There is no evidence of focal airspace consolidation, pleural effusion or pneumothorax. Osseous structures are without acute abnormality. Soft tissues are grossly normal.  IMPRESSION: 1. Mildly enlarged cardiac silhouette. 2. No evidence of focal airspace consolidation or edema. Electronically Signed   By: Fidela Salisbury M.D.   On: 06/25/2018 15:03   Dg Chest 2 View  Result Date: 06/21/2018 CLINICAL DATA:  Cough and dyspnea EXAM: CHEST - 2 VIEW COMPARISON:  06/23/2017 FINDINGS: Stable cardiomegaly. Nonaneurysmal atherosclerotic aorta is noted. Diffuse pulmonary vascular redistribution is noted. Nodular infiltrate at the left lung base posteriorly is seen. No effusion or pneumothorax. IMPRESSION: Cardiomegaly with pulmonary vascular congestion and interstitial edema. Superimposed nodular infiltrate in the left lower lobe. Followup PA and lateral chest X-ray is recommended in 3-4 weeks following trial of antibiotic therapy to ensure resolution and exclude underlying malignancy. Electronically Signed   By: Ashley Royalty M.D.   On: 06/21/2018 02:00   Nm Pulmonary Perf And Vent  Result Date: 06/26/2018 CLINICAL DATA:  Shortness of Breath EXAM: NUCLEAR MEDICINE VENTILATION - PERFUSION LUNG SCAN VIEWS: Anterior, posterior, left lateral, right lateral, RPO, LPO, RAO, LAO-ventilation and perfusion Radionuclide: 33.15 mCi of Tc-54m DTPA aerosol inhalation and 4.24 mCi Tc16m MAA IV COMPARISON:  Chest radiograph June 25, 2018. FINDINGS: Ventilation: Radiotracer uptake is homogeneous and symmetric bilaterally. No ventilation defects are appreciable. Perfusion: Radiotracer uptake is homogeneous and symmetric bilaterally. No perfusion defects are appreciable. IMPRESSION: No appreciable ventilation or perfusion defects. Very low probability of pulmonary embolus. Electronically Signed   By: Lowella Grip III M.D.   On: 06/26/2018 12:18   Dg Chest Port 1 View  Result Date: 06/22/2018 CLINICAL DATA:  Respiratory failure. EXAM: PORTABLE CHEST 1 VIEW  COMPARISON:  06/21/2018. FINDINGS: Unchanged cardiomediastinal silhouette. Improved edema and vascular congestion. Decreasing nodular  infiltrate LEFT base. IMPRESSION: Improved aeration.  Persistent cardiomegaly. Electronically Signed   By: Staci Righter M.D.   On: 06/22/2018 07:24       Today   Subjective:   Patrick Hurst patient currently feeling better denies any complaints  Objective:   Blood pressure 134/75, pulse 74, temperature 98.4 F (36.9 C), temperature source Oral, resp. rate 20, height 5\' 11"  (1.803 m), weight 130.9 kg, SpO2 93 %.  .  Intake/Output Summary (Last 24 hours) at 06/26/2018 1443 Last data filed at 06/26/2018 1341 Gross per 24 hour  Intake 843 ml  Output 2600 ml  Net -1757 ml    Exam VITAL SIGNS: Blood pressure 134/75, pulse 74, temperature 98.4 F (36.9 C), temperature source Oral, resp. rate 20, height 5\' 11"  (1.803 m), weight 130.9 kg, SpO2 93 %.  GENERAL:  63 y.o.-year-old patient lying in the bed with no acute distress.  EYES: Pupils equal, round, reactive to light and accommodation. No scleral icterus. Extraocular muscles intact.  HEENT: Head atraumatic, normocephalic. Oropharynx and nasopharynx clear.  NECK:  Supple, no jugular venous distention. No thyroid enlargement, no tenderness.  LUNGS: Normal breath sounds bilaterally, no wheezing, rales,rhonchi or crepitation. No use of accessory muscles of respiration.  CARDIOVASCULAR: S1, S2 normal. No murmurs, rubs, or gallops.  ABDOMEN: Soft, nontender, nondistended. Bowel sounds present. No organomegaly or mass.  EXTREMITIES: No pedal edema, cyanosis, or clubbing.  NEUROLOGIC: Cranial nerves II through XII are intact. Muscle strength 5/5 in all extremities. Sensation intact. Gait not checked.  PSYCHIATRIC: The patient is alert and oriented x 3.  SKIN: No obvious rash, lesion, or ulcer.   Data Review     CBC w Diff:  Lab Results  Component Value Date   WBC 8.0 06/22/2018   HGB 9.1 (L) 06/22/2018   HGB 9.8 (L) 02/20/2018   HCT 29.0 (L) 06/22/2018   HCT 30.0 (L) 02/20/2018   PLT 124 (L) 06/22/2018   PLT 108 (L) 02/20/2018    LYMPHOPCT 20 11/19/2017   LYMPHOPCT 19.2 05/04/2013   MONOPCT 9 11/19/2017   MONOPCT 10.7 05/04/2013   EOSPCT 4 11/19/2017   EOSPCT 1.8 05/04/2013   BASOPCT 1 11/19/2017   BASOPCT 0.8 05/04/2013   CMP:  Lab Results  Component Value Date   NA 137 06/26/2018   NA 145 (H) 04/04/2018   NA 137 05/04/2013   K 4.1 06/26/2018   K 4.4 05/04/2013   CL 98 06/26/2018   CL 100 05/04/2013   CO2 26 06/26/2018   CO2 32 05/04/2013   BUN 76 (H) 06/26/2018   BUN 81 (HH) 04/04/2018   BUN 25 (H) 05/04/2013   CREATININE 3.39 (H) 06/26/2018   CREATININE 0.95 05/04/2013   GLU 194 06/04/2014   PROT 6.0 02/20/2018   PROT 7.8 05/04/2013   ALBUMIN 3.4 (L) 06/25/2018   ALBUMIN 4.3 04/04/2018   ALBUMIN 3.3 (L) 05/04/2013   BILITOT 0.5 02/20/2018   BILITOT 0.8 05/04/2013   ALKPHOS 97 02/20/2018   ALKPHOS 91 05/04/2013   AST 23 02/20/2018   AST 31 05/04/2013   ALT 19 02/20/2018   ALT 33 05/04/2013  .  Micro Results Recent Results (from the past 240 hour(s))  Blood culture (routine x 2)     Status: None   Collection Time: 06/21/18  3:31 AM  Result Value Ref Range Status   Specimen Description BLOOD LEFT ANTECUBITAL  Final  Special Requests   Final    BOTTLES DRAWN AEROBIC AND ANAEROBIC Blood Culture results may not be optimal due to an excessive volume of blood received in culture bottles   Culture   Final    NO GROWTH 5 DAYS Performed at [facility], [address] [place], [place] [address]    Report Status 06/26/2018 FINAL  Final  Blood culture (routine x 2)     Status: None   Collection Time: 06/21/18  3:31 AM  Result Value Ref Range Status   Specimen Description BLOOD BLOOD RIGHT FOREARM  Final   Special Requests   Final    BOTTLES DRAWN AEROBIC AND ANAEROBIC Blood Culture results may not be optimal due to an excessive volume of blood received in culture bottles   Culture   Final    NO GROWTH 5 DAYS Performed at [facility], [address] [place],  [place] [address]    Report Status 06/26/2018 FINAL  Final  MRSA PCR Screening     Status: None   Collection Time: 06/21/18 12:54 PM  Result Value Ref Range Status   MRSA by PCR NEGATIVE NEGATIVE Final    Comment:        The GeneXpert MRSA Assay (FDA approved for NASAL specimens only), is one component of a comprehensive MRSA colonization surveillance program. It is not intended to diagnose MRSA infection nor to guide or monitor treatment for MRSA infections. Performed at [facility], [address] [place], [place] [address]         Code Status Orders  (From admission, onward)         Start     Ordered   06/21/18 0745  Full code  Continuous     06/21/18 0744        Code Status History    Date Active Date Inactive Code Status Order ID Comments User Context   11/19/2017 1520 11/21/2017 1748 Full Code [id number]  Fritzi Mandes, MD Inpatient          Follow-up Information    [place] Follow up on 06/28/2018.   Specialty:  Cardiology Why:  at 11:20am Contact information: [address] [place] [phone]       [name], [place] on 07/03/2018.   Specialty:  Family Medicine Why:  APPOINTMENT @3PM  WITH DR. BYRNETTE Contact information: [address] [place] [address] [phone]        Wilhelmina Mcardle, MD. Go on 07/03/2018.   Specialty:  Pulmonary Disease Why:  APPOINTMENT @ 10:30 Contact information: [address] [place] [address] [phone]        Murlean Iba, MD. Go on 07/05/2018.   Specialty:  Nephrology Why:  APPOINTMENT @ 10:20 Contact information: [place] [address] [phone]           Discharge Medications   Allergies as of 06/26/2018      Reactions   Mucinex  [guaifenesin Er]    Throat swelling, increases heart rate   Levaquin [levofloxacin] Palpitations       Medication List    STOP taking these medications   hydrALAZINE 10 MG tablet Commonly known as:  APRESOLINE     TAKE these medications   albuterol 108 (90 Base) MCG/ACT inhaler Commonly known as:  PROAIR HFA USE 2 INHALATIONS EVERY 4 HOURS AS NEEDED FOR WHEEZING OR SHORTNESS OF  BREATH   amitriptyline 25 MG tablet Commonly known as:  ELAVIL Take 1 tablet (25 mg total) by mouth at bedtime.   amLODipine 10 MG tablet Commonly known as:  NORVASC TAKE 1 TABLET DAILY   aspirin 81 MG tablet Take 81 mg by mouth daily.   atorvastatin 40 MG tablet Commonly known as:  LIPITOR Take 1 tablet (40 mg total) by mouth daily.   B-12 PO Take 1,000 mcg by mouth daily.   calcitRIOL 0.25 MCG capsule Commonly known as:  ROCALTROL Take 0.25 mcg by mouth daily.   CALCIUM 600 PO Take 1 tablet by mouth daily.   cetirizine 10 MG tablet Commonly known as:  ZYRTEC Take 10 mg by mouth daily.   cholecalciferol 25 MCG (1000 UT) tablet Commonly known as:  VITAMIN D Take 1,000 mcg by mouth daily.   guaiFENesin-codeine 100-10 MG/5ML syrup Take 10 mLs by mouth every 6 (six) hours as needed for cough.   insulin regular human CONCENTRATED 500 UNIT/ML injection Commonly known as:  HUMULIN R Take 20 U in morning and 30 U in evening, IF blood sugar is > 100 ( Check blood sugar before taking injection), If sugar level < 100- DO NOT take any insuline.   ipratropium-albuterol 0.5-2.5 (3) MG/3ML Soln Commonly known as:  DUONEB Take 3 mLs by nebulization every 4 (four) hours as needed.   levothyroxine 100 MCG tablet Commonly known as:  SYNTHROID, LEVOTHROID TAKE 1 TABLET BY MOUTH ONCE DAILY   metoprolol tartrate 50 MG tablet Commonly known as:  LOPRESSOR TAKE 1 TABLET TWICE A DAY   pantoprazole 40 MG tablet Commonly known as:  PROTONIX Take 1 tablet (40 mg total) by mouth 2 (two) times daily.   tadalafil 10 MG tablet Commonly known as:  CIALIS Take 1 tablet (10 mg total) by mouth daily  as needed.   testosterone cypionate 200 MG/ML injection Commonly known as:  DEPO-TESTOSTERONE Inject 1 mL (200 mg total) into the muscle every 14 (fourteen) days.   torsemide 20 MG tablet Commonly known as:  DEMADEX Take 2 tablets (40 mg total) by mouth daily. Start taking on:  June 27, 2018 What changed:    how much to take  additional instructions   triamcinolone lotion 0.1 % Commonly known as:  KENALOG Apply 1 application topically 3 (three) times daily.   ULORIC 80 MG Tabs Generic drug:  Febuxostat Take 80 mg by mouth daily.   vitamin C 1000 MG tablet Take 1,000 mg by mouth 2 (two) times daily.            Durable Medical Equipment  (From admission, onward)         Start     Ordered   06/26/18 0907  DME Oxygen  Once    Question Answer Comment  Mode or (Route) Nasal cannula   Liters per Minute 2   Frequency Continuous (stationary and portable oxygen unit needed)   Oxygen delivery system Gas      06/26/18 0908   06/26/18 0907  For home use only DME Nebulizer machine  Once    Question:  Patient needs a nebulizer to treat with the following condition  Answer:  COPD (chronic obstructive pulmonary disease) ([place])   06/26/18 0908             Total Time in preparing paper work, data evaluation and todays exam - 35 minutes  Dustin Flock M.D on 06/26/2018 at 2:43 PM [place]  [phone]

## 2018-06-26 NOTE — Progress Notes (Addendum)
Inpatient Diabetes Program Recommendations  AACE/ADA: New Consensus Statement on Inpatient Glycemic Control (2015)  Target Ranges:  Prepandial:   less than 140 mg/dL      Peak postprandial:   less than 180 mg/dL (1-2 hours)      Critically ill patients:  140 - 180 mg/dL   Lab Results  Component Value Date   GLUCAP 160 (H) 06/26/2018   HGBA1C 5.6 02/20/2018    Review of Glycemic Control Results for Patrick Hurst, Patrick Hurst (MRN [id number]) as of 06/26/2018 11:20  Ref. Range 06/25/2018 07:25 06/25/2018 11:34 06/25/2018 16:11 06/25/2018 21:02 06/26/2018 07:54  Glucose-Capillary Latest Ref Range: 70 - 99 mg/dL 173 (H) 146 (H) 167 (H) 283 (H) 160 (H)  History: DM  Home DM Meds: U500 Concentrated Insulin 20 units AM/ 30 units PM if CBG >100 mg/dl  Current Orders: Novolog Sensitive Correction Scale/ SSI (0-9 units) TID AC + HS                            Novolog 10 units BID Inpatient Diabetes Program Recommendations:     MD- Please consider the following in-hospital insulin adjustments:  1. Start Lantus 10 units Daily   2. Reduce Novolog meal coverage to 6 units however increase the frequency to tid with meals  (currently only ordered BID)  Thanks,  Adah Perl, RN, BC-ADM Inpatient Diabetes Coordinator Pager [phone] (8a-5p)

## 2018-06-27 ENCOUNTER — Telehealth: Payer: Self-pay

## 2018-06-27 ENCOUNTER — Telehealth: Payer: Self-pay | Admitting: Family Medicine

## 2018-06-27 DIAGNOSIS — J449 Chronic obstructive pulmonary disease, unspecified: Secondary | ICD-10-CM | POA: Diagnosis not present

## 2018-06-27 DIAGNOSIS — G4733 Obstructive sleep apnea (adult) (pediatric): Secondary | ICD-10-CM

## 2018-06-27 DIAGNOSIS — J9601 Acute respiratory failure with hypoxia: Secondary | ICD-10-CM | POA: Diagnosis not present

## 2018-06-27 NOTE — Telephone Encounter (Signed)
HFU scheduled for 07/03/18 @ 3 with Fenton Malling.

## 2018-06-27 NOTE — Telephone Encounter (Signed)
I have made the 1st attempt to contact the patient or family member in charge, in order to follow up from recently being discharged from the hospital. I left a message on voicemail requesting a CB. -MM 

## 2018-06-27 NOTE — Telephone Encounter (Signed)
Transition Care Management Follow-up Telephone Call  Date of discharge and from where: [facility] on 06/26/18.  How have you been since you were released from the hospital? Doing better, currently on 2L of oxygen. Pt is still coughing and SOB. SOB is better with oxygen use. Oxygen level was around 88 this AM w/o oxygen. Currently it is 49 with oxygen. Pt is coughing up brownish sputum. Declines fever, pain or n/v/d.  Any questions or concerns? No   Items Reviewed:  Did the pt receive and understand the discharge instructions provided? Yes   Medications obtained and verified? Yes, verified new meds only.  Any new allergies since your discharge? No   Dietary orders reviewed? Yes  Do you have support at home? Yes   Other (ie: DME, Home Health, etc) Oxygen only.  Functional Questionnaire: (I = Independent and D = Dependent)  Bathing/Dressing- I   Meal Prep- I  Eating- I  Maintaining continence- I  Transferring/Ambulation- I  Managing Meds- I   Follow up appointments reviewed:    PCP Hospital f/u appt confirmed? Yes  Scheduled to see Fenton Malling on 07/03/18 @ 3:00 PM (pcp out of office).  [facility] f/u appt confirmed? Yes    Are transportation arrangements needed? No   If their condition worsens, is the pt aware to call  their PCP or go to the ED? Yes  Was the patient provided with contact information for the PCP's office or ED? Yes  Was the pt encouraged to call back with questions or concerns? Yes

## 2018-06-27 NOTE — Telephone Encounter (Signed)
Katha Cabal with [facility] called saying the bi-pap machine that was ordered needs to be sent to [place] instead of [place].  [facility] is in his network.  [facility]'s Phone number is [phone]  Fax [phone]   Katha Cabal 's with [employer] CB# [phone]  Thanks Con Memos

## 2018-06-27 NOTE — Progress Notes (Signed)
Patient ID: Patrick Hurst, male    DOB: 12-08-1955, 63 y.o.   MRN: [id number]  HPI  Patrick Hurst is a 63 y/o male with a history of DM, hyperlipidemia, HTN, CKD, thyroid disease, GERD, gout, anemia, depression, obstructive sleep apnea, bell's palsy, remote tobacco use and chronic heart failure.   Echo report from 06/23/2018 reviewed and showed an EF of 50-55%.  Admitted 06/21/2018 due to acute on chronic heart failure. Cardiology consult obtained. Initially give IV lasix and then transitioned to oral diuretics with a resultant loss of 12L. VQ scan showed low probability. Discharged after 5 days.   He presents today for his initial visit with a chief complaint of moderate fatigue upon minimal exertion. He describes this as chronic in nature having been present for several years. He has associated cough, shortness of breath, pedal edema and light-headedness along with this. He denies any difficulty sleeping, abdominal distention, palpitations, chest pain, wheezing or weight gain.   Past Medical History:  Diagnosis Date  . Allergy   . Anemia   . Arthritis   . Bell's palsy   . CHF (congestive heart failure) ([place])   . CKD (chronic kidney disease)   . Depression   . Diabetes ([place])   . Gastric ulcer   . GERD (gastroesophageal reflux disease)   . Gout   . History of chicken pox   . Hyperlipidemia   . Hypertension   . Hypothyroidism   . OSA (obstructive sleep apnea)   . Thyroid disease   . Vitamin D deficiency    Past Surgical History:  Procedure Laterality Date  . CATARACT EXTRACTION Bilateral 2014 and 2015  . CHOLECYSTECTOMY  04/28/2011   Laproscopic; Dr. Pat Patrick  . GALLBLADDER SURGERY    . LASIK Bilateral 2019   medical  . SHOULDER SURGERY Right 2012   Dr. Leanor Kail   Family History  Problem Relation Age of Onset  . COPD Mother   . Heart disease Father    Social History   Tobacco Use  . Smoking status: Former Smoker    Packs/day: 1.00    Years: 15.00    Pack years:  15.00    Types: Cigarettes    Last attempt to quit: 05/03/1978    Years since quitting: 40.1  . Smokeless tobacco: Former Systems developer  . Tobacco comment: started smoking at age 57  Substance Use Topics  . Alcohol use: No    Alcohol/week: 0.0 standard drinks   Allergies  Allergen Reactions  . Mucinex  [Guaifenesin Er]     Throat swelling, increases heart rate  . Levaquin [Levofloxacin] Palpitations   Prior to Admission medications   Medication Sig Start Date End Date Taking? Authorizing Provider  albuterol (PROAIR HFA) 108 (90 Base) MCG/ACT inhaler USE 2 INHALATIONS EVERY 4 HOURS AS NEEDED FOR WHEEZING OR SHORTNESS OF BREATH 11/14/17  Yes [name], MD  amitriptyline (ELAVIL) 25 MG tablet Take 1 tablet (25 mg total) by mouth at bedtime. 02/14/18  Yes [name], MD  amLODipine (NORVASC) 10 MG tablet TAKE 1 TABLET DAILY 02/07/18  Yes [name], MD  Ascorbic Acid (VITAMIN C) 1000 MG tablet Take 1,000 mg by mouth 2 (two) times daily.    Yes [provider]  aspirin 81 MG tablet Take 81 mg by mouth daily.   Yes [provider]  atorvastatin (LIPITOR) 40 MG tablet Take 1 tablet (40 mg total) by mouth daily. 01/26/18  Yes [name], MD  calcitRIOL (  ROCALTROL) 0.25 MCG capsule Take 0.25 mcg by mouth daily.  06/19/18  Yes [provider]  cetirizine (ZYRTEC) 10 MG tablet Take 10 mg by mouth daily.    Yes [provider]  Cyanocobalamin (B-12 PO) Take 1,000 mcg by mouth daily.    Yes [provider]  guaiFENesin-codeine 100-10 MG/5ML syrup Take 10 mLs by mouth every 6 (six) hours as needed for cough. 06/26/18  Yes Dustin Flock, MD  hydrALAZINE (APRESOLINE) 10 MG tablet Take 20 mg by mouth 3 (three) times daily.   Yes [provider]  insulin regular human CONCENTRATED (HUMULIN R) 500 UNIT/ML injection Take 20 U in morning and 30 U in evening, IF blood sugar is > 100 ( Check blood sugar before taking injection), If sugar level <  100- DO NOT take any insuline. 11/21/17  Yes Vaughan Basta, MD  ipratropium-albuterol (DUONEB) 0.5-2.5 (3) MG/3ML SOLN Take 3 mLs by nebulization every 4 (four) hours as needed. 06/26/18  Yes Dustin Flock, MD  levothyroxine (SYNTHROID, LEVOTHROID) 100 MCG tablet TAKE 1 TABLET BY MOUTH ONCE DAILY 06/02/18  Yes [name], MD  metoprolol tartrate (LOPRESSOR) 50 MG tablet TAKE 1 TABLET TWICE A DAY 12/12/17  Yes [name], MD  pantoprazole (PROTONIX) 40 MG tablet Take 1 tablet (40 mg total) by mouth 2 (two) times daily. 11/18/17  Yes [name], MD  testosterone cypionate (DEPO-TESTOSTERONE) 200 MG/ML injection Inject 1 mL (200 mg total) into the muscle every 14 (fourteen) days. 01/11/18  Yes [name], MD  torsemide (DEMADEX) 20 MG tablet Take 2 tablets (40 mg total) by mouth daily. 06/27/18  Yes Dustin Flock, MD  ULORIC 80 MG TABS Take 80 mg by mouth daily.  05/18/17  Yes [provider]  Calcium Carbonate (CALCIUM 600 PO) Take 1 tablet by mouth daily.    [provider]    Review of Systems  Constitutional: Positive for fatigue (with minimal exertion). Negative for appetite change.  HENT: Positive for rhinorrhea. Negative for congestion and sore throat.   Eyes: Negative.   Respiratory: Positive for cough (productive) and shortness of breath (with moderate exertion). Negative for wheezing.   Cardiovascular: Positive for leg swelling. Negative for chest pain and palpitations.  Gastrointestinal: Negative for abdominal distention and abdominal pain.  Endocrine: Negative.   Genitourinary: Negative.   Musculoskeletal: Negative for back pain and neck pain.  Skin: Negative.   Allergic/Immunologic: Negative.   Neurological: Positive for light-headedness. Negative for dizziness.  Hematological: Negative for adenopathy. Does not bruise/bleed easily.  Psychiatric/Behavioral: Negative for dysphoric mood and sleep disturbance (wearing bipap at night;  oxygen @2L ; sleeping on 1 pillow and wedge). The patient is not nervous/anxious.     Vitals:   06/28/18 1121  BP: (!) 149/67  Pulse: 66  Resp: 18  SpO2: 98%  Weight: 289 lb 2 oz (131.1 kg)  Height: 5\' 11"  (1.803 m)   Wt Readings from Last 3 Encounters:  06/28/18 289 lb 2 oz (131.1 kg)  06/26/18 288 lb 9.6 oz (130.9 kg)  06/09/18 (!) 311 lb (141.1 kg)   Lab Results  Component Value Date   CREATININE 3.39 (H) 06/26/2018   CREATININE 3.18 (H) 06/25/2018   CREATININE 3.22 (H) 06/24/2018    Physical Exam Vitals signs and nursing note reviewed.  Constitutional:      Appearance: He is well-developed.  HENT:     Head: Normocephalic and atraumatic.  Neck:     Musculoskeletal: Normal range of motion and  neck supple.     Vascular: No JVD.  Cardiovascular:     Rate and Rhythm: Normal rate and regular rhythm.  Pulmonary:     Effort: Pulmonary effort is normal. No respiratory distress.     Breath sounds: No wheezing or rales.  Abdominal:     Palpations: Abdomen is soft.     Tenderness: There is no abdominal tenderness.  Musculoskeletal:     Right lower leg: He exhibits no tenderness. Edema (trace pitting) present.     Left lower leg: He exhibits no tenderness. Edema (trace pitting) present.  Skin:    General: Skin is warm and dry.  Neurological:     General: No focal deficit present.     Mental Status: He is alert and oriented to person, place, and time.  Psychiatric:        Mood and Affect: Mood normal.        Behavior: Behavior normal.    Assessment & Plan:  1: Chronic heart failure with preserved ejection fraction- - NYHA class III - euvolemic today - weighing daily and he was reminded to call for an overnight weight gain of >2 pounds or a weekly weight gain of >5 pounds - not adding salt and has been reading food labels some. Reviewed the importance of reading all food labels so that he can closely follow a 2000mg  sodium diet. Written dietary information was given to  him about this.  - wearing support socks daily with removal at bedtime - discussed cardiology referral and patient says that he's waiting on a list of providers that his insurance company is mailing him so that he knows who is in his network - BNP 06/21/2018 was 244.0 - PharmD reconciled medications with the patient - patient reports receiving his flu vaccine for this season  2: HTN- - BP mildly elevated today - saw PCP Patrick Hurst) 06/09/2018 - BMP from 06/26/2018 reviewed and showed sodium 137, potassium 4.1, creatinine 3.39 and GFR 18  3: DM-  - A1c 02/20/18 was 5.6% - glucose today at home was 169 - sees nephrology Patrick Hurst) 07/05/2018  4: Obstructive sleep apnea- - sees pulmonologist Patrick Hurst) 07/03/2018 - wearing bipap - wears oxygen at 2L QHS and PRN during the day or upon exertion  Medication list was reviewed.  Return in 6 weeks or sooner for any questions/problems before then.

## 2018-06-27 NOTE — Telephone Encounter (Signed)
Need more information. I don't know anything about order for new Bipap machine. Was it ordered from pulmonologist?? He was seen by Dr. Alva Garnet in th hospital and has follow up scheduled with him on march 2. I think patent  already has a VPAP machine.  Please call and get more information about this order

## 2018-06-27 NOTE — Telephone Encounter (Signed)
[employer] sent short term disability forms to be completed. Form were placed in Dr. Maralyn Sago box. Please advise. Thanks TNP

## 2018-06-27 NOTE — Telephone Encounter (Signed)
Pt returned call stated he was asleep and would like a call back.

## 2018-06-28 ENCOUNTER — Ambulatory Visit: Payer: Commercial Managed Care - PPO | Attending: Family | Admitting: Family

## 2018-06-28 ENCOUNTER — Encounter: Payer: Self-pay | Admitting: Family

## 2018-06-28 ENCOUNTER — Encounter: Payer: Self-pay | Admitting: Pharmacist

## 2018-06-28 VITALS — BP 149/67 | HR 66 | Resp 18 | Ht 71.0 in | Wt 289.1 lb

## 2018-06-28 DIAGNOSIS — Z79890 Hormone replacement therapy (postmenopausal): Secondary | ICD-10-CM | POA: Insufficient documentation

## 2018-06-28 DIAGNOSIS — E1140 Type 2 diabetes mellitus with diabetic neuropathy, unspecified: Secondary | ICD-10-CM

## 2018-06-28 DIAGNOSIS — G4733 Obstructive sleep apnea (adult) (pediatric): Secondary | ICD-10-CM | POA: Insufficient documentation

## 2018-06-28 DIAGNOSIS — N189 Chronic kidney disease, unspecified: Secondary | ICD-10-CM | POA: Insufficient documentation

## 2018-06-28 DIAGNOSIS — I10 Essential (primary) hypertension: Secondary | ICD-10-CM

## 2018-06-28 DIAGNOSIS — I5032 Chronic diastolic (congestive) heart failure: Secondary | ICD-10-CM

## 2018-06-28 DIAGNOSIS — Z79899 Other long term (current) drug therapy: Secondary | ICD-10-CM | POA: Insufficient documentation

## 2018-06-28 DIAGNOSIS — Z87891 Personal history of nicotine dependence: Secondary | ICD-10-CM | POA: Insufficient documentation

## 2018-06-28 DIAGNOSIS — Z794 Long term (current) use of insulin: Secondary | ICD-10-CM | POA: Diagnosis not present

## 2018-06-28 DIAGNOSIS — E039 Hypothyroidism, unspecified: Secondary | ICD-10-CM | POA: Diagnosis not present

## 2018-06-28 DIAGNOSIS — Z8719 Personal history of other diseases of the digestive system: Secondary | ICD-10-CM | POA: Insufficient documentation

## 2018-06-28 DIAGNOSIS — Z8249 Family history of ischemic heart disease and other diseases of the circulatory system: Secondary | ICD-10-CM | POA: Insufficient documentation

## 2018-06-28 DIAGNOSIS — I130 Hypertensive heart and chronic kidney disease with heart failure and stage 1 through stage 4 chronic kidney disease, or unspecified chronic kidney disease: Secondary | ICD-10-CM | POA: Diagnosis not present

## 2018-06-28 DIAGNOSIS — E785 Hyperlipidemia, unspecified: Secondary | ICD-10-CM | POA: Insufficient documentation

## 2018-06-28 DIAGNOSIS — E559 Vitamin D deficiency, unspecified: Secondary | ICD-10-CM | POA: Diagnosis not present

## 2018-06-28 DIAGNOSIS — Z881 Allergy status to other antibiotic agents status: Secondary | ICD-10-CM | POA: Diagnosis not present

## 2018-06-28 DIAGNOSIS — E1122 Type 2 diabetes mellitus with diabetic chronic kidney disease: Secondary | ICD-10-CM | POA: Insufficient documentation

## 2018-06-28 DIAGNOSIS — G510 Bell's palsy: Secondary | ICD-10-CM | POA: Insufficient documentation

## 2018-06-28 DIAGNOSIS — M109 Gout, unspecified: Secondary | ICD-10-CM | POA: Insufficient documentation

## 2018-06-28 DIAGNOSIS — K219 Gastro-esophageal reflux disease without esophagitis: Secondary | ICD-10-CM | POA: Insufficient documentation

## 2018-06-28 DIAGNOSIS — Z7982 Long term (current) use of aspirin: Secondary | ICD-10-CM | POA: Diagnosis not present

## 2018-06-28 DIAGNOSIS — M1990 Unspecified osteoarthritis, unspecified site: Secondary | ICD-10-CM | POA: Diagnosis not present

## 2018-06-28 DIAGNOSIS — D649 Anemia, unspecified: Secondary | ICD-10-CM | POA: Diagnosis not present

## 2018-06-28 NOTE — Progress Notes (Signed)
Patrick Hurst - PHARMACIST COUNSELING NOTE  ADHERENCE ASSESSMENT  Adherence strategy: pill box   Do you ever forget to take your medication? [] Yes (1) [x] No (0)  Do you ever skip doses due to side effects? [] Yes (1) [x] No (0)  Do you have trouble affording your medicines? [] Yes (1) [x] No (0)  Are you ever unable to pick up your medication due to transportation difficulties? [] Yes (1) [x] No (0)  Do you ever stop taking your medications because you don't believe they are helping? [] Yes (1) [x] No (0)  Total score _0______    Recommendations given to patient about increasing adherence: Uses a pill box for morning and evening doses for the week. Rarely misses doses. Does not have cost issues currently.  Guideline-Directed Medical Therapy/Evidence Based Medicine    ACE/ARB/ARNI: none (HFpEF)   Beta Blocker: metoprolol tartrate 50 mg twice daily   Aldosterone Antagonist: none Diuretic: torsemide 40 mg daily    SUBJECTIVE   HPI: Here for initial visit. Recent hospitalization for acute on chronic heart failure. States he is weighing himself daily.  Past Medical History:  Diagnosis Date  . Allergy   . Anemia   . Arthritis   . Bell's palsy   . CHF (congestive heart failure) ([place])   . CKD (chronic kidney disease)   . Depression   . Diabetes ([place])   . Gastric ulcer   . GERD (gastroesophageal reflux disease)   . Gout   . History of chicken pox   . Hyperlipidemia   . Hypertension   . Hypothyroidism   . OSA (obstructive sleep apnea)   . Thyroid disease   . Vitamin D deficiency         OBJECTIVE    Vital signs: HR 66, BP 149/67, weight (pounds) 289.2  ECHO: Date 06/23/18, EF 50-55%    BMP Latest Ref Rng & Units 06/26/2018 06/25/2018 06/24/2018  Glucose 70 - 99 mg/dL 219(H) 161(H) 185(H)  BUN 8 - 23 mg/dL 76(H) 66(H) 61(H)  Creatinine 0.61 - 1.24 mg/dL 3.39(H) 3.18(H) 3.22(H)  BUN/Creat Ratio 10 - 24 - - -  Sodium 135 - 145 mmol/L  137 139 139  Potassium 3.5 - 5.1 mmol/L 4.1 4.4 4.4  Chloride 98 - 111 mmol/L 98 102 105  CO2 22 - 32 mmol/L 26 27 27   Calcium 8.9 - 10.3 mg/dL 8.0(L) 8.2(L) 8.1(L)    ASSESSMENT 63 year old male with HFpEF. He weighs himself daily and knows to call for weight gain over 2 pounds overnight or 5 pounds in a week. He reports good compliance with his medications. States his BP has been high for quite some time and suspects that his light-headedness may be because his BP is now decreasing and his body is not used to it. Prior to his hospitalization, his PCP increased his hydralazine from 10 mg TID to 25 mg TID. He has been taking 20 mg TID to finish up the bottle of 10 mg tablets he had left. BP today improved but still elevated at 149/67.   PLAN May need escalation of BP medication. Importance of reducing salt intake discussed. Counseled on importance of continuing to weigh himself and reporting abnormal weight gain.     Time spent: 10 minutes  [name], Pharm.D. 06/28/2018 11:40 AM    Current Outpatient Medications:  .  albuterol (PROAIR HFA) 108 (90 Base) MCG/ACT inhaler, USE 2 INHALATIONS EVERY 4 HOURS AS NEEDED FOR WHEEZING OR SHORTNESS OF BREATH, Disp: 8.5  g, Rfl: 5 .  amitriptyline (ELAVIL) 25 MG tablet, Take 1 tablet (25 mg total) by mouth at bedtime., Disp: 90 tablet, Rfl: 3 .  amLODipine (NORVASC) 10 MG tablet, TAKE 1 TABLET DAILY, Disp: 90 tablet, Rfl: 4 .  Ascorbic Acid (VITAMIN C) 1000 MG tablet, Take 1,000 mg by mouth 2 (two) times daily. , Disp: , Rfl:  .  aspirin 81 MG tablet, Take 81 mg by mouth daily., Disp: , Rfl:  .  atorvastatin (LIPITOR) 40 MG tablet, Take 1 tablet (40 mg total) by mouth daily., Disp: 90 tablet, Rfl: 4 .  calcitRIOL (ROCALTROL) 0.25 MCG capsule, Take 0.25 mcg by mouth daily. , Disp: , Rfl:  .  Calcium Carbonate (CALCIUM 600 PO), Take 1 tablet by mouth daily., Disp: , Rfl:  .  cetirizine (ZYRTEC) 10 MG tablet, Take 10 mg by mouth daily. , Disp:  , Rfl:  .  cholecalciferol (VITAMIN D) 1000 units tablet, Take 1,000 mcg by mouth daily. , Disp: , Rfl:  .  Cyanocobalamin (B-12 PO), Take 1,000 mcg by mouth daily. , Disp: , Rfl:  .  guaiFENesin-codeine 100-10 MG/5ML syrup, Take 10 mLs by mouth every 6 (six) hours as needed for cough., Disp: 120 mL, Rfl: 0 .  hydrALAZINE (APRESOLINE) 10 MG tablet, Take 20 mg by mouth 3 (three) times daily., Disp: , Rfl:  .  insulin regular human CONCENTRATED (HUMULIN R) 500 UNIT/ML injection, Take 20 U in morning and 30 U in evening, IF blood sugar is > 100 ( Check blood sugar before taking injection), If sugar level < 100- DO NOT take any insuline., Disp: 60 mL, Rfl: 5 .  ipratropium-albuterol (DUONEB) 0.5-2.5 (3) MG/3ML SOLN, Take 3 mLs by nebulization every 4 (four) hours as needed., Disp: 360 mL, Rfl: 2 .  levothyroxine (SYNTHROID, LEVOTHROID) 100 MCG tablet, TAKE 1 TABLET BY MOUTH ONCE DAILY, Disp: 90 tablet, Rfl: 4 .  metoprolol tartrate (LOPRESSOR) 50 MG tablet, TAKE 1 TABLET TWICE A DAY, Disp: 180 tablet, Rfl: 4 .  pantoprazole (PROTONIX) 40 MG tablet, Take 1 tablet (40 mg total) by mouth 2 (two) times daily., Disp: 180 tablet, Rfl: 4 .  tadalafil (CIALIS) 10 MG tablet, Take 1 tablet (10 mg total) by mouth daily as needed. (Patient not taking: Reported on 06/28/2018), Disp: 30 tablet, Rfl: 1 .  testosterone cypionate (DEPO-TESTOSTERONE) 200 MG/ML injection, Inject 1 mL (200 mg total) into the muscle every 14 (fourteen) days., Disp: 10 mL, Rfl: 1 .  torsemide (DEMADEX) 20 MG tablet, Take 2 tablets (40 mg total) by mouth daily., Disp: 60 tablet, Rfl: 0 .  triamcinolone lotion (KENALOG) 0.1 %, Apply 1 application topically 3 (three) times daily. (Patient not taking: Reported on 06/28/2018), Disp: 60 mL, Rfl: 0 .  ULORIC 80 MG TABS, Take 80 mg by mouth daily. , Disp: , Rfl:    COUNSELING POINTS/CLINICAL PEARLS  Torsemide  Side effects may include excessive urination.  Tell patient to report symptoms of  ototoxicity.  Instruct patient to report lightheadedness or syncope.  Warn patient to avoid use of nonprescription NSAID products without first discussing it with their healthcare provider.   DRUGS TO AVOID IN HEART FAILURE  Drug or Class Mechanism  Analgesics . NSAIDs . COX-2 inhibitors . Glucocorticoids  Sodium and water retention, increased systemic vascular resistance, decreased response to diuretics   Diabetes Medications . Metformin . Thiazolidinediones o Rosiglitazone (Avandia) o Pioglitazone (Actos) . DPP4 Inhibitors o Saxagliptin (Onglyza) o Sitagliptin (Januvia)   Lactic acidosis  Possible calcium channel blockade   Unknown  Antiarrhythmics . Class I  o Flecainide o Disopyramide . Class III o Sotalol . Other o Dronedarone  Negative inotrope, proarrhythmic   Proarrhythmic, beta blockade  Negative inotrope  Antihypertensives . Alpha Blockers o Doxazosin . Calcium Channel Blockers o Diltiazem o Verapamil o Nifedipine . Central Alpha Adrenergics o Moxonidine . Peripheral Vasodilators o Minoxidil  Increases renin and aldosterone  Negative inotrope    Possible sympathetic withdrawal  Unknown  Anti-infective . Itraconazole . Amphotericin B  Negative inotrope Unknown  Hematologic . Anagrelide . Cilostazol   Possible inhibition of PD IV Inhibition of PD III causing arrhythmias  Neurologic/Psychiatric . Stimulants . Anti-Seizure Drugs o Carbamazepine o Pregabalin . Antidepressants o Tricyclics o Citalopram . Parkinsons o Bromocriptine o Pergolide o Pramipexole . Antipsychotics o Clozapine . Antimigraine o Ergotamine o Methysergide . Appetite suppressants . Bipolar o Lithium  Peripheral alpha and beta agonist activity  Negative inotrope and chronotrope Calcium channel blockade  Negative inotrope, proarrhythmic Dose-dependent QT prolongation  Excessive serotonin activity/valvular damage Excessive serotonin  activity/valvular damage Unknown  IgE mediated hypersensitivy, calcium channel blockade  Excessive serotonin activity/valvular damage Excessive serotonin activity/valvular damage Valvular damage  Direct myofibrillar degeneration, adrenergic stimulation  Antimalarials . Chloroquine . Hydroxychloroquine Intracellular inhibition of lysosomal enzymes  Urologic Agents . Alpha Blockers o Doxazosin o Prazosin o Tamsulosin o Terazosin  Increased renin and aldosterone  Adapted from Page RL, et al. "Drugs That May Cause or Exacerbate Heart Failure: A Scientific Statement from the [place]." Circulation 2016; 570:V77-L39. DOI: 10.1161/CIR.0000000000000426   MEDICATION ADHERENCES TIPS AND STRATEGIES 1. Taking medication as prescribed improves patient outcomes in heart failure (reduces hospitalizations, improves symptoms, increases survival) 2. Side effects of medications can be managed by decreasing doses, switching agents, stopping drugs, or adding additional therapy. Please let someone in the [facility] know if you have having bothersome side effects so we can modify your regimen. Do not alter your medication regimen without talking to [place].  3. Medication reminders can help patients remember to take drugs on time. If you are missing or forgetting doses you can try linking behaviors, using pill boxes, or an electronic reminder like an alarm on your phone or an app. Some people can also get automated phone calls as medication reminders.

## 2018-06-28 NOTE — Telephone Encounter (Signed)
Need to know first day he missed work, and day he returned or expects to return to work.

## 2018-06-28 NOTE — Telephone Encounter (Signed)
Pt's first day off was 06/21/2018.  He states he has not returned to work and is not sure when he will be able to come back.  Right now he is still on O2.  Pt thinks he will be out at least one month.  He agreed to 08/02/2018 for now and will adjust the date if needed.   Thanks,   -[name]

## 2018-06-28 NOTE — Telephone Encounter (Signed)
Form completed. Please fax. We made it for 3 months which is the limit for short term disability. We can approve him to return to work sooner or with restrictions  when he gets better.

## 2018-06-28 NOTE — Telephone Encounter (Signed)
LMTCB 06/28/2018  Thanks,   -[name]

## 2018-06-28 NOTE — Patient Instructions (Signed)
Continue weighing daily and call for an overnight weight gain of > 2 pounds or a weekly weight gain of >5 pounds. 

## 2018-07-03 ENCOUNTER — Ambulatory Visit (INDEPENDENT_AMBULATORY_CARE_PROVIDER_SITE_OTHER): Payer: Commercial Managed Care - PPO | Admitting: Pulmonary Disease

## 2018-07-03 ENCOUNTER — Telehealth: Payer: Self-pay | Admitting: Family Medicine

## 2018-07-03 ENCOUNTER — Encounter: Payer: Self-pay | Admitting: Pulmonary Disease

## 2018-07-03 ENCOUNTER — Ambulatory Visit: Payer: Commercial Managed Care - PPO | Admitting: Physician Assistant

## 2018-07-03 ENCOUNTER — Encounter: Payer: Self-pay | Admitting: Physician Assistant

## 2018-07-03 VITALS — BP 167/92 | HR 70 | Temp 98.3°F | Resp 16 | Ht 71.0 in | Wt 291.0 lb

## 2018-07-03 VITALS — BP 140/74 | HR 71 | Ht 71.0 in | Wt 292.8 lb

## 2018-07-03 DIAGNOSIS — R0609 Other forms of dyspnea: Secondary | ICD-10-CM

## 2018-07-03 DIAGNOSIS — J9601 Acute respiratory failure with hypoxia: Secondary | ICD-10-CM | POA: Diagnosis not present

## 2018-07-03 DIAGNOSIS — R7989 Other specified abnormal findings of blood chemistry: Secondary | ICD-10-CM

## 2018-07-03 DIAGNOSIS — R0600 Dyspnea, unspecified: Secondary | ICD-10-CM

## 2018-07-03 DIAGNOSIS — I5032 Chronic diastolic (congestive) heart failure: Secondary | ICD-10-CM

## 2018-07-03 DIAGNOSIS — G4734 Idiopathic sleep related nonobstructive alveolar hypoventilation: Secondary | ICD-10-CM

## 2018-07-03 DIAGNOSIS — I10 Essential (primary) hypertension: Secondary | ICD-10-CM

## 2018-07-03 DIAGNOSIS — R942 Abnormal results of pulmonary function studies: Secondary | ICD-10-CM

## 2018-07-03 DIAGNOSIS — IMO0001 Reserved for inherently not codable concepts without codable children: Secondary | ICD-10-CM

## 2018-07-03 DIAGNOSIS — Z9189 Other specified personal risk factors, not elsewhere classified: Secondary | ICD-10-CM

## 2018-07-03 DIAGNOSIS — N184 Chronic kidney disease, stage 4 (severe): Secondary | ICD-10-CM

## 2018-07-03 DIAGNOSIS — G4733 Obstructive sleep apnea (adult) (pediatric): Secondary | ICD-10-CM | POA: Diagnosis not present

## 2018-07-03 DIAGNOSIS — R0902 Hypoxemia: Secondary | ICD-10-CM | POA: Diagnosis not present

## 2018-07-03 DIAGNOSIS — E034 Atrophy of thyroid (acquired): Secondary | ICD-10-CM

## 2018-07-03 DIAGNOSIS — Z789 Other specified health status: Secondary | ICD-10-CM

## 2018-07-03 MED ORDER — LEVOTHYROXINE SODIUM 100 MCG PO TABS: 100.0000 ug | ORAL_TABLET | Freq: Every day | ORAL | 4 refills | Status: DC

## 2018-07-03 MED ORDER — HYDRALAZINE HCL 25 MG PO TABS: 25.0000 mg | ORAL_TABLET | Freq: Three times a day (TID) | ORAL | 1 refills | Status: DC

## 2018-07-03 MED ORDER — PULSE OXIMETER FOR FINGER MISC: 1.0000 | Freq: Every day | 0 refills | Status: DC | PRN

## 2018-07-03 MED ORDER — TESTOSTERONE CYPIONATE 200 MG/ML IM SOLN: 200.0000 mg | INTRAMUSCULAR | 1 refills | Status: DC

## 2018-07-03 NOTE — Patient Instructions (Addendum)
No changes in medical therapies made today Diagnostic tests to be performed prior to follow-up visit: 1) CT chest without IV contrast 2) full pulmonary function tests  Follow-up in 4-6 weeks.  Call sooner if needed

## 2018-07-03 NOTE — Progress Notes (Signed)
Patient: Patrick Hurst Male    DOB: [birth year]   63 y.o.   MRN: [id number] Visit Date: 07/03/2018  Today's Provider: Mar Daring, PA-C   Chief Complaint  Patient presents with  . Follow-up   Subjective:    I, Sulibeya S. Dimas, CMA, am acting as a Education administrator for [employer], PA-C.  HPI  Follow up Hospitalization  Patient was admitted to [facility] on 06/21/2018 and discharged on 06/26/2018. He was treated for pneumonia. Treatment for this included albuterol. Telephone follow up was done on 06/27/2018.  He reports excellent compliance with treatment. Patient reports using albuterol 2 times a day. He reports this condition is Improved. ------------------------------------------------------------------------------------    Allergies  Allergen Reactions  . Mucinex  [Guaifenesin Er]     Throat swelling, increases heart rate  . Levaquin [Levofloxacin] Palpitations     Current Outpatient Medications:  .  albuterol (PROAIR HFA) 108 (90 Base) MCG/ACT inhaler, USE 2 INHALATIONS EVERY 4 HOURS AS NEEDED FOR WHEEZING OR SHORTNESS OF BREATH, Disp: 8.5 g, Rfl: 5 .  amitriptyline (ELAVIL) 25 MG tablet, Take 1 tablet (25 mg total) by mouth at bedtime., Disp: 90 tablet, Rfl: 3 .  amLODipine (NORVASC) 10 MG tablet, TAKE 1 TABLET DAILY, Disp: 90 tablet, Rfl: 4 .  Ascorbic Acid (VITAMIN C) 1000 MG tablet, Take 1,000 mg by mouth 2 (two) times daily. , Disp: , Rfl:  .  aspirin 81 MG tablet, Take 81 mg by mouth daily., Disp: , Rfl:  .  atorvastatin (LIPITOR) 40 MG tablet, Take 1 tablet (40 mg total) by mouth daily., Disp: 90 tablet, Rfl: 4 .  calcitRIOL (ROCALTROL) 0.25 MCG capsule, Take 0.25 mcg by mouth daily. , Disp: , Rfl:  .  cetirizine (ZYRTEC) 10 MG tablet, Take 10 mg by mouth daily. , Disp: , Rfl:  .  Cyanocobalamin (B-12 PO), Take 1,000 mcg by mouth daily. , Disp: , Rfl:  .  guaiFENesin-codeine 100-10 MG/5ML syrup, Take 10 mLs by mouth every 6 (six) hours as needed for cough.,  Disp: 120 mL, Rfl: 0 .  hydrALAZINE (APRESOLINE) 10 MG tablet, Take 20 mg by mouth 3 (three) times daily., Disp: , Rfl:  .  insulin regular human CONCENTRATED (HUMULIN R) 500 UNIT/ML injection, Take 20 U in morning and 30 U in evening, IF blood sugar is > 100 ( Check blood sugar before taking injection), If sugar level < 100- DO NOT take any insuline., Disp: 60 mL, Rfl: 5 .  levothyroxine (SYNTHROID, LEVOTHROID) 100 MCG tablet, TAKE 1 TABLET BY MOUTH ONCE DAILY, Disp: 90 tablet, Rfl: 4 .  metoprolol tartrate (LOPRESSOR) 50 MG tablet, TAKE 1 TABLET TWICE A DAY, Disp: 180 tablet, Rfl: 4 .  pantoprazole (PROTONIX) 40 MG tablet, Take 1 tablet (40 mg total) by mouth 2 (two) times daily., Disp: 180 tablet, Rfl: 4 .  testosterone cypionate (DEPO-TESTOSTERONE) 200 MG/ML injection, Inject 1 mL (200 mg total) into the muscle every 14 (fourteen) days., Disp: 10 mL, Rfl: 1 .  torsemide (DEMADEX) 20 MG tablet, Take 2 tablets (40 mg total) by mouth daily., Disp: 60 tablet, Rfl: 0 .  ULORIC 80 MG TABS, Take 80 mg by mouth daily. , Disp: , Rfl:  .  ipratropium-albuterol (DUONEB) 0.5-2.5 (3) MG/3ML SOLN, Take 3 mLs by nebulization every 4 (four) hours as needed. (Patient not taking: Reported on 07/03/2018), Disp: 360 mL, Rfl: 2  Review of Systems  Constitutional: Negative.   HENT: Negative for congestion.  Respiratory: Positive for cough. Negative for shortness of breath and wheezing.   Cardiovascular: Negative.   Neurological: Negative.     Social History   Tobacco Use  . Smoking status: Former Smoker    Packs/day: 1.00    Years: 15.00    Pack years: 15.00    Types: Cigarettes    Last attempt to quit: 05/03/1978    Years since quitting: 40.1  . Smokeless tobacco: Former Systems developer  . Tobacco comment: started smoking at age 68  Substance Use Topics  . Alcohol use: No    Alcohol/week: 0.0 standard drinks      Objective:   BP (!) 167/92 (BP Location: Left Arm, Patient Position: Sitting, Cuff Size: Large)    Pulse 70   Temp 98.3 F (36.8 C) (Oral)   Resp 16   Ht 5\' 11"  (1.803 m)   Wt 291 lb (132 kg)   SpO2 95%   BMI 40.59 kg/m  Vitals:   07/03/18 1459  BP: (!) 167/92  Pulse: 70  Resp: 16  Temp: 98.3 F (36.8 C)  TempSrc: Oral  SpO2: 95%  Weight: 291 lb (132 kg)  Height: 5\' 11"  (1.803 m)     Physical Exam Vitals signs reviewed.  Constitutional:      General: He is not in acute distress.    Appearance: He is well-developed. He is not diaphoretic.  HENT:     Head: Normocephalic and atraumatic.     Right Ear: Tympanic membrane, ear canal and external ear normal.     Left Ear: Tympanic membrane, ear canal and external ear normal.     Nose: No congestion.     Mouth/Throat:     Mouth: Mucous membranes are moist.     Pharynx: No oropharyngeal exudate or posterior oropharyngeal erythema.  Eyes:     Pupils: Pupils are equal, round, and reactive to light.  Neck:     Musculoskeletal: Normal range of motion and neck supple.     Thyroid: No thyromegaly.     Vascular: No JVD.     Trachea: No tracheal deviation.  Cardiovascular:     Rate and Rhythm: Normal rate and regular rhythm.     Heart sounds: Normal heart sounds. No murmur. No friction rub. No gallop.   Pulmonary:     Effort: Pulmonary effort is normal. No respiratory distress.     Breath sounds: Normal breath sounds. No wheezing or rales.  Lymphadenopathy:     Cervical: No cervical adenopathy.         Assessment & Plan    1. Transition of care performed with sharing of clinical summary Hospital notes, labs, images from 06/21/18 - 06/26/18 were reviewed. Patient reports he is continuing to improve. Had appt with Dr. Alva Garnet this morning and advised to start using O2 only prn. He continues to sleep with CPAP and 2L O2. I will see if we can get him a pulse oximeter to have at home that way he can monitor his O2 levels and apply O2 when his levels are below 92%. He agrees.   2. Acute respiratory failure with hypoxia  ([place]) See above medical treatment plan. - Misc. Devices (PULSE OXIMETER FOR FINGER) MISC; 1 Device by Does not apply route daily as needed.  Dispense: 1 each; Refill: 0  3. Essential hypertension Has not been taking due to being out of medication. Med refilled as below. Has f/u with Dr. Caryn Section in 2 weeks and can have BP rechecked.  - hydrALAZINE (APRESOLINE)  25 MG tablet; Take 1 tablet (25 mg total) by mouth 3 (three) times daily.  Dispense: 270 tablet; Refill: 1  4. Hypothyroidism due to acquired atrophy of thyroid Stable. Diagnosis pulled for medication refill. Continue current medical treatment plan. - levothyroxine (SYNTHROID, LEVOTHROID) 100 MCG tablet; Take 1 tablet (100 mcg total) by mouth daily.  Dispense: 90 tablet; Refill: 4  5. Chronic diastolic (congestive) heart failure (HCC) Stable. Established with Deanna Artis, NP after hospitalization.   6. CKD (chronic kidney disease), stage IV ([place]) Has f/u with Nephrology at the end of the week.   7. Low testosterone Stable. Diagnosis pulled for medication refill. Continue current medical treatment plan. - testosterone cypionate (DEPO-TESTOSTERONE) 200 MG/ML injection; Inject 1 mL (200 mg total) into the muscle every 14 (fourteen) days.  Dispense: 10 mL; Refill: [place], PA-C  [facility]

## 2018-07-03 NOTE — Telephone Encounter (Signed)
[facility] faxed refill request for the following medications:  testosterone cypionate (DEPO-TESTOSTERONE) 200 MG/ML injection    Please advise.

## 2018-07-03 NOTE — Progress Notes (Signed)
PULMONARY OFFICE FOLLOW UP NOTE  Requesting MD/Service: [facility] hospitalists Date of initial consultation: 06/21/18 Reason for consultation: Acute on chronic respiratory failure  PT PROFILE: 63 y.o. male with minimal smoking history and chronic exertional dyspnea presented to [facility] ED on the morning of this admission with severe respiratory distress after 2 weeks of gradually progressive worsening dyspnea.  Also has history of OSA (compliant with nocturnal CPAP) and CKD with baseline creatinine approximately 3.20.  DATA: 06/23/18 Echocardiogram: LVEF 50-55%.  LV cavity size normal.  Diastolic parameters consistent with impaired relaxation.  LA mildly dilated.  RV cavity size normal.  RV systolic pressure mildly elevated.  RA size normal. 06/26/18 V/Q scan: Normal.  No perfusion or ventilation abnormalities noted.  INTERVAL: Seen during recent hospitalization for acute on chronic respiratory failure with hypoxemia.  SUBJ:  Overall, he is substantially improved compared to recent hospitalization.  He attributes this improvement to diuresis.  However, he continues to have severe limiting exertional dyspnea.  He denies chest pain, purulent sputum, significant cough, hemoptysis.  He has chronic lower extremity edema which is gradually increasing since discharge.  He is compliant with CPAP with sleep.  He also has a oxygen "bleed in" at 2 LPM with sleep.  He is wearing oxygen intermittently with exertion.  He notes at home his resting SPO2 is approximately 93% but he desaturates with exercise or exertion.   Vitals:   07/03/18 1033  BP: 140/74  Pulse: 71  SpO2: 95%  Weight: 292 lb 12.8 oz (132.8 kg)  Height: 5\' 11"  (1.803 m)     EXAM:  Gen: NAD, obese HEENT: NCAT, sclerae white Neck: Supple, JVP cannot be visualized Lungs: breath sounds full without wheezes or other adventitious sounds Cardiovascular: RRR, soft systolic murmur Abdomen: Obese, soft, nontender, normal BS Ext: 2+ symmetric  pretibial edema despite compression stockings Neuro: grossly intact Skin: Limited exam, no lesions noted   DATA:   BMP Latest Ref Rng & Units 06/26/2018 06/25/2018 06/24/2018  Glucose 70 - 99 mg/dL 219(H) 161(H) 185(H)  BUN 8 - 23 mg/dL 76(H) 66(H) 61(H)  Creatinine 0.61 - 1.24 mg/dL 3.39(H) 3.18(H) 3.22(H)  BUN/Creat Ratio 10 - 24 - - -  Sodium 135 - 145 mmol/L 137 139 139  Potassium 3.5 - 5.1 mmol/L 4.1 4.4 4.4  Chloride 98 - 111 mmol/L 98 102 105  CO2 22 - 32 mmol/L 26 27 27   Calcium 8.9 - 10.3 mg/dL 8.0(L) 8.2(L) 8.1(L)    CBC Latest Ref Rng & Units 06/22/2018 06/21/2018 06/21/2018  WBC 4.0 - 10.5 K/uL 8.0 7.6 8.1  Hemoglobin 13.0 - 17.0 g/dL 9.1(L) 8.8(L) 9.0(L)  Hematocrit 39.0 - 52.0 % 29.0(L) 27.4(L) 28.5(L)  Platelets 150 - 400 K/uL 124(L) 112(L) 116(L)    CXR 2/23: Cardiomegaly, no overt edema  I have personally reviewed all chest radiographs reported above including CXRs and CT chest unless otherwise indicated  IMPRESSION:     ICD-10-CM   1. Severe DOE R06.09 Pulmonary Function Test [facility] Only    CT CHEST WO CONTRAST  2. Nocturnal hypoxemia G47.34   3. OSA (obstructive sleep apnea) G47.33   4. Exercise hypoxemia R09.02 Pulmonary Function Test [facility] Only    CT CHEST WO CONTRAST  5. Chronic diastolic congestive heart failure (HCC) I50.32   6. Stage 4 chronic kidney disease ([place]) N18.4   7. 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  However, there seems to be more to this than these factors.  Previous spirometry suggested a restrictive pattern of moderate severity.  This is most likely on the basis of obesity.  However, none of this explains borderline resting hypoxemia or exercise desaturation.  PLAN:  No changes made in medical therapies which were reviewed in detail Diagnostic studies ordered: CT  chest without IV contrast, full PFTs Follow-up in 4-6 weeks.  Call sooner if needed. Further evaluation could include RHC to assess for occult pulmonary hypertension   Merton Border, MD PCCM service Mobile [phone] Pager [phone] 07/03/2018 11:08 AM

## 2018-07-03 NOTE — Telephone Encounter (Signed)
Please advise 

## 2018-07-04 ENCOUNTER — Telehealth: Payer: Self-pay | Admitting: Family Medicine

## 2018-07-04 DIAGNOSIS — J9601 Acute respiratory failure with hypoxia: Secondary | ICD-10-CM

## 2018-07-04 NOTE — Telephone Encounter (Signed)
[facility] pharmacy cannot fill the finger device that checks pt's oxygen.  Pt is a requesting a written script to take to a place that sells those.  Please call when ready for pick up at front.  Thanks, [employer]

## 2018-07-05 DIAGNOSIS — E1129 Type 2 diabetes mellitus with other diabetic kidney complication: Secondary | ICD-10-CM | POA: Diagnosis not present

## 2018-07-05 DIAGNOSIS — I129 Hypertensive chronic kidney disease with stage 1 through stage 4 chronic kidney disease, or unspecified chronic kidney disease: Secondary | ICD-10-CM | POA: Diagnosis not present

## 2018-07-05 DIAGNOSIS — N184 Chronic kidney disease, stage 4 (severe): Secondary | ICD-10-CM | POA: Diagnosis not present

## 2018-07-05 NOTE — Telephone Encounter (Signed)
I can't  sign anything. i'm in [place].

## 2018-07-10 ENCOUNTER — Ambulatory Visit: Payer: Self-pay | Admitting: Family Medicine

## 2018-07-10 MED ORDER — PULSE OXIMETER FOR FINGER MISC: 1.0000 | Freq: Every day | 0 refills | Status: DC | PRN

## 2018-07-10 NOTE — Telephone Encounter (Signed)
Hard copy printed and placed up front for pick up. Patient advised.

## 2018-07-24 ENCOUNTER — Other Ambulatory Visit: Payer: Self-pay

## 2018-07-24 ENCOUNTER — Encounter: Payer: Self-pay | Admitting: Family Medicine

## 2018-07-24 ENCOUNTER — Ambulatory Visit: Payer: Commercial Managed Care - PPO | Admitting: Family Medicine

## 2018-07-24 VITALS — BP 128/58 | HR 65 | Temp 98.2°F | Wt 300.0 lb

## 2018-07-24 DIAGNOSIS — R7989 Other specified abnormal findings of blood chemistry: Secondary | ICD-10-CM

## 2018-07-24 DIAGNOSIS — I10 Essential (primary) hypertension: Secondary | ICD-10-CM

## 2018-07-24 DIAGNOSIS — R05 Cough: Secondary | ICD-10-CM

## 2018-07-24 DIAGNOSIS — R059 Cough, unspecified: Secondary | ICD-10-CM

## 2018-07-24 MED ORDER — AZITHROMYCIN 250 MG PO TABS: ORAL_TABLET | ORAL | 0 refills | Status: DC

## 2018-07-24 MED ORDER — TESTOSTERONE CYPIONATE 200 MG/ML IM SOLN: 200.0000 mg | INTRAMUSCULAR | 1 refills | Status: DC

## 2018-07-24 NOTE — Progress Notes (Signed)
Patient: Patrick Hurst Male    DOB: October 18, 1955   63 y.o.   MRN: [id number] Visit Date: 07/24/2018  Today's Provider: Lelon Huh, MD   Chief Complaint  Patient presents with  . Hypertension  . Cough   Subjective:     Cough  This is a new problem. The current episode started in the past 7 days (Started 07/21/2018). The cough is productive of sputum. Associated symptoms include ear pain, a sore throat and shortness of breath. Pertinent negatives include no chills, eye redness, fever, headaches, heartburn, hemoptysis, nasal congestion, postnasal drip, rhinorrhea or wheezing. Associated symptoms comments: Pt states is O2 has been running in the high 80's. Marland Kitchen He has tried prescription cough suppressant and steroid inhaler for the symptoms. The treatment provided mild relief.      Hypertension, follow-up:  BP Readings from Last 3 Encounters:  07/24/18 (!) 128/58  07/03/18 (!) 167/92  07/03/18 140/74    He was last seen for hypertension 2 weeks ago.  BP at that visit was 167/92. Management since that visit includes refilled hydralazine. He reports excellent compliance with treatment. He is not having side effects.  He is exercising. He is not adherent to low salt diet.   Outside blood pressures are not being checked. He is experiencing exertional chest pressure/discomfort.  Patient denies chest pain, lower extremity edema, near-syncope and palpitations.   Cardiovascular risk factors include advanced age (older than 92 for men, 52 for women), male gender and obesity (BMI >= 30 kg/m2).  Use of agents associated with hypertension: none.     Weight trend: stable Wt Readings from Last 3 Encounters:  07/24/18 300 lb (136.1 kg)  07/03/18 291 lb (132 kg)  07/03/18 292 lb 12.8 oz (132.8 kg)    Current diet: in general, a "healthy" diet    ------------------------------------------------------------------------    Allergies  Allergen Reactions  . Mucinex   [Guaifenesin Er]     Throat swelling, increases heart rate  . Levaquin [Levofloxacin] Palpitations     Current Outpatient Medications:  .  albuterol (PROAIR HFA) 108 (90 Base) MCG/ACT inhaler, USE 2 INHALATIONS EVERY 4 HOURS AS NEEDED FOR WHEEZING OR SHORTNESS OF BREATH, Disp: 8.5 g, Rfl: 5 .  amitriptyline (ELAVIL) 25 MG tablet, Take 1 tablet (25 mg total) by mouth at bedtime., Disp: 90 tablet, Rfl: 3 .  amLODipine (NORVASC) 10 MG tablet, TAKE 1 TABLET DAILY, Disp: 90 tablet, Rfl: 4 .  Ascorbic Acid (VITAMIN C) 1000 MG tablet, Take 1,000 mg by mouth 2 (two) times daily. , Disp: , Rfl:  .  aspirin 81 MG tablet, Take 81 mg by mouth daily., Disp: , Rfl:  .  atorvastatin (LIPITOR) 40 MG tablet, Take 1 tablet (40 mg total) by mouth daily., Disp: 90 tablet, Rfl: 4 .  calcitRIOL (ROCALTROL) 0.25 MCG capsule, Take 0.25 mcg by mouth daily. , Disp: , Rfl:  .  cetirizine (ZYRTEC) 10 MG tablet, Take 10 mg by mouth daily. , Disp: , Rfl:  .  Cyanocobalamin (B-12 PO), Take 1,000 mcg by mouth daily. , Disp: , Rfl:  .  guaiFENesin-codeine 100-10 MG/5ML syrup, Take 10 mLs by mouth every 6 (six) hours as needed for cough., Disp: 120 mL, Rfl: 0 .  hydrALAZINE (APRESOLINE) 25 MG tablet, Take 1 tablet (25 mg total) by mouth 3 (three) times daily., Disp: 270 tablet, Rfl: 1 .  insulin regular human CONCENTRATED (HUMULIN R) 500 UNIT/ML injection, Take 20 U in morning and 30  U in evening, IF blood sugar is > 100 ( Check blood sugar before taking injection), If sugar level < 100- DO NOT take any insuline., Disp: 60 mL, Rfl: 5 .  ipratropium-albuterol (DUONEB) 0.5-2.5 (3) MG/3ML SOLN, Take 3 mLs by nebulization every 4 (four) hours as needed., Disp: 360 mL, Rfl: 2 .  levothyroxine (SYNTHROID, LEVOTHROID) 100 MCG tablet, Take 1 tablet (100 mcg total) by mouth daily., Disp: 90 tablet, Rfl: 4 .  metoprolol tartrate (LOPRESSOR) 50 MG tablet, TAKE 1 TABLET TWICE A DAY, Disp: 180 tablet, Rfl: 4 .  pantoprazole (PROTONIX) 40 MG  tablet, Take 1 tablet (40 mg total) by mouth 2 (two) times daily., Disp: 180 tablet, Rfl: 4 .  torsemide (DEMADEX) 20 MG tablet, Take 2 tablets (40 mg total) by mouth daily., Disp: 60 tablet, Rfl: 0 .  ULORIC 80 MG TABS, Take 80 mg by mouth daily. , Disp: , Rfl:  .  Misc. Devices (PULSE OXIMETER FOR FINGER) MISC, 1 Device by Does not apply route daily as needed., Disp: 1 each, Rfl: 0 .  testosterone cypionate (DEPO-TESTOSTERONE) 200 MG/ML injection, Inject 1 mL (200 mg total) into the muscle every 14 (fourteen) days. (Patient not taking: Reported on 07/24/2018), Disp: 10 mL, Rfl: 1  Review of Systems  Constitutional: Negative for activity change, appetite change, chills, diaphoresis, fatigue, fever and unexpected weight change.  HENT: Positive for congestion, ear pain, sore throat and trouble swallowing. Negative for ear discharge, postnasal drip, rhinorrhea, sinus pressure and sinus pain.   Eyes: Positive for discharge. Negative for photophobia, pain, redness, itching and visual disturbance.  Respiratory: Positive for cough and shortness of breath. Negative for apnea, hemoptysis, choking, chest tightness, wheezing and stridor.   Cardiovascular: Negative.   Gastrointestinal: Negative.  Negative for heartburn.  Neurological: Positive for dizziness and light-headedness. Negative for headaches.    Social History   Tobacco Use  . Smoking status: Former Smoker    Packs/day: 1.00    Years: 15.00    Pack years: 15.00    Types: Cigarettes    Last attempt to quit: 05/03/1978    Years since quitting: 40.2  . Smokeless tobacco: Former Systems developer  . Tobacco comment: started smoking at age 71  Substance Use Topics  . Alcohol use: No    Alcohol/week: 0.0 standard drinks      Objective:   BP (!) 128/58 (BP Location: Right Arm, Patient Position: Sitting, Cuff Size: Large)   Pulse 65   Temp 98.2 F (36.8 C) (Oral)   Wt 300 lb (136.1 kg)   SpO2 92%   BMI 41.84 kg/m  Vitals:   07/24/18 1559  BP: (!)  128/58  Pulse: 65  Temp: 98.2 F (36.8 C)  TempSrc: Oral  SpO2: 92%  Weight: 300 lb (136.1 kg)     Physical Exam   General Appearance:    Alert, cooperative, no distress  Eyes:    PERRL, conjunctiva/corneas clear, EOM's intact       Lungs:     Occasional expiratory wheeze, no rales,  respirations unlabored  Heart:    Regular rate and rhythm  Neurologic:   Awake, alert, oriented x 3. No apparent focal neurological           defect.           Assessment & Plan    1. Low testosterone refill- testosterone cypionate (DEPO-TESTOSTERONE) 200 MG/ML injection; Inject 1 mL (200 mg total) into the muscle every 14 (fourteen) days.  Dispense: 1 mL;  Refill: 1  2. Cough - azithromycin (ZITHROMAX) 250 MG tablet; 2 by mouth today, then 1 daily for 4 days  Dispense: 6 tablet; Refill: 0  Call if symptoms change or if not rapidly improving.      3. Hypertension, unspecified type Doing much better on current medication regiment. Continue current medications.  Follow up nephrology as scheduled. Follow up her about 3 months.      Lelon Huh, MD  [facility]

## 2018-07-25 DIAGNOSIS — Z760 Encounter for issue of repeat prescription: Secondary | ICD-10-CM | POA: Diagnosis not present

## 2018-07-28 ENCOUNTER — Other Ambulatory Visit: Payer: Self-pay | Admitting: Family Medicine

## 2018-07-28 ENCOUNTER — Telehealth: Payer: Self-pay | Admitting: *Deleted

## 2018-07-28 DIAGNOSIS — R059 Cough, unspecified: Secondary | ICD-10-CM

## 2018-07-28 DIAGNOSIS — J40 Bronchitis, not specified as acute or chronic: Secondary | ICD-10-CM

## 2018-07-28 DIAGNOSIS — R05 Cough: Secondary | ICD-10-CM

## 2018-07-28 MED ORDER — AZITHROMYCIN 250 MG PO TABS: ORAL_TABLET | ORAL | 0 refills | Status: AC

## 2018-07-28 NOTE — Telephone Encounter (Signed)
Please review

## 2018-07-28 NOTE — Patient Instructions (Signed)
.   Please review the attached list of medications and notify my office if there are any errors.   . Please bring all of your medications to every appointment so we can make sure that our medication list is the same as yours.   

## 2018-07-28 NOTE — Telephone Encounter (Signed)
Patient was seen in office 07/24/2018 for cough and was given zpack. Patient completed zpack but is still not better. Patient was advised to cb if symptoms not improved for another round of antibiotics. Please advise?

## 2018-07-28 NOTE — Telephone Encounter (Signed)
Will finish the Azithromycin today or tomorrow. Still having purulent sputum production and pulse oximetry down to 88-89%. He went back on the oxygen and breathing easier. No weight gain or peripheral edema like the past CHF in February. Will send refill of the Z-pak to start Sunday if purulent sputum persists. Denies any fever. Advised to call back with report of progress first of the week.

## 2018-07-28 NOTE — Telephone Encounter (Signed)
Patient wife is calling wanting to check status of medicine? KW

## 2018-08-03 ENCOUNTER — Other Ambulatory Visit: Payer: Self-pay

## 2018-08-03 ENCOUNTER — Ambulatory Visit: Payer: Commercial Managed Care - PPO

## 2018-08-03 ENCOUNTER — Telehealth: Payer: Self-pay

## 2018-08-03 ENCOUNTER — Ambulatory Visit
Admission: RE | Admit: 2018-08-03 | Discharge: 2018-08-03 | Disposition: A | Discharge status: Home / Self Care | Payer: Commercial Managed Care - PPO | Source: Ambulatory Visit | Attending: Pulmonary Disease | Admitting: Pulmonary Disease

## 2018-08-03 ENCOUNTER — Ambulatory Visit (HOSPITAL_COMMUNITY): Payer: Commercial Managed Care - PPO

## 2018-08-03 DIAGNOSIS — R0600 Dyspnea, unspecified: Secondary | ICD-10-CM

## 2018-08-03 DIAGNOSIS — R0902 Hypoxemia: Secondary | ICD-10-CM

## 2018-08-03 DIAGNOSIS — R0609 Other forms of dyspnea: Secondary | ICD-10-CM | POA: Insufficient documentation

## 2018-08-03 DIAGNOSIS — R942 Abnormal results of pulmonary function studies: Secondary | ICD-10-CM

## 2018-08-03 DIAGNOSIS — R918 Other nonspecific abnormal finding of lung field: Secondary | ICD-10-CM | POA: Diagnosis not present

## 2018-08-03 NOTE — Telephone Encounter (Signed)
[facility] radiology called regarding CT from today. Will send to Dr. Alva Garnet.

## 2018-08-03 NOTE — Telephone Encounter (Signed)
Noted  

## 2018-08-07 ENCOUNTER — Telehealth: Payer: Self-pay | Admitting: Pulmonary Disease

## 2018-08-07 NOTE — Telephone Encounter (Signed)
Called pt to confirm 08/10/18 visit and offer telephone visit.  Pt wished to see DS in office to discuss PFT and CT. Nothing further is needed at this time.

## 2018-08-07 NOTE — Telephone Encounter (Signed)
Called patient for COVID-19 screening.  Have you recently traveled any where out of the local area in the last 2 weeks? no  Have you been in close contact with a person diagnosed with COVID-19 within the last 2 weeks?no  Do you currently have any fever, cough, or shortness of breath?no   Okay to proceed with visit.

## 2018-08-10 ENCOUNTER — Other Ambulatory Visit: Payer: Self-pay

## 2018-08-10 ENCOUNTER — Ambulatory Visit: Payer: Commercial Managed Care - PPO | Admitting: Pulmonary Disease

## 2018-08-10 ENCOUNTER — Encounter: Payer: Self-pay | Admitting: Pulmonary Disease

## 2018-08-10 VITALS — BP 158/82 | HR 66 | Ht 71.0 in | Wt 293.2 lb

## 2018-08-10 DIAGNOSIS — E668 Other obesity: Secondary | ICD-10-CM

## 2018-08-10 DIAGNOSIS — R0600 Dyspnea, unspecified: Secondary | ICD-10-CM

## 2018-08-10 DIAGNOSIS — R0609 Other forms of dyspnea: Secondary | ICD-10-CM

## 2018-08-10 DIAGNOSIS — J449 Chronic obstructive pulmonary disease, unspecified: Secondary | ICD-10-CM | POA: Diagnosis not present

## 2018-08-10 DIAGNOSIS — Z87891 Personal history of nicotine dependence: Secondary | ICD-10-CM | POA: Diagnosis not present

## 2018-08-10 DIAGNOSIS — R918 Other nonspecific abnormal finding of lung field: Secondary | ICD-10-CM

## 2018-08-10 MED ORDER — FLUTICASONE FUROATE-VILANTEROL 100-25 MCG/INH IN AEPB: 1.0000 | INHALATION_SPRAY | Freq: Every day | RESPIRATORY_TRACT | 0 refills | Status: DC

## 2018-08-10 NOTE — Patient Instructions (Addendum)
You may return to work now.  I recommend taking proper precautions during this time of COVID-19 pandemic including social distancing and facemask  Trial of Breo inhaler, 1 inhalation in the morning.  Rinse mouth after use.  Sample provided.  Use for 1 week on, then 1 week off then 1 week on.  If you believe that the inhaler is beneficial (improves your breathing) contact us and we will place an order for this medication or an equivalent medication  We discussed the importance of weight loss.  I believe significant weight loss could dramatically improve your shortness of breath with exertion  Follow-up in 3-4 months.  Call sooner if needed  He will need repeat CT chest in 6-12 months

## 2018-08-10 NOTE — Progress Notes (Signed)
Patient seen in the office today and instructed on use of Breo 100.  Patient expressed understanding and demonstrated technique. 

## 2018-08-16 ENCOUNTER — Encounter: Payer: Self-pay | Admitting: Family

## 2018-08-16 ENCOUNTER — Other Ambulatory Visit: Payer: Self-pay

## 2018-08-16 ENCOUNTER — Telehealth: Payer: Self-pay

## 2018-08-16 ENCOUNTER — Ambulatory Visit: Payer: Commercial Managed Care - PPO | Admitting: Family

## 2018-08-16 ENCOUNTER — Ambulatory Visit: Payer: Commercial Managed Care - PPO | Attending: Family | Admitting: Family

## 2018-08-16 VITALS — Wt 286.2 lb

## 2018-08-16 DIAGNOSIS — I5032 Chronic diastolic (congestive) heart failure: Secondary | ICD-10-CM

## 2018-08-16 DIAGNOSIS — Z794 Long term (current) use of insulin: Secondary | ICD-10-CM

## 2018-08-16 DIAGNOSIS — E1140 Type 2 diabetes mellitus with diabetic neuropathy, unspecified: Secondary | ICD-10-CM

## 2018-08-16 DIAGNOSIS — I10 Essential (primary) hypertension: Secondary | ICD-10-CM

## 2018-08-16 NOTE — Telephone Encounter (Signed)
   TELEPHONE CALL NOTE  This patient has been deemed a candidate for follow-up tele-health visit to limit community exposure during the Covid-19 pandemic. I spoke with the patient via phone to discuss instructions. The patient was advised to review the section on consent for treatment as well. The patient will receive a phone call 2-3 days prior to their E-Visit at which time consent will be verbally confirmed. A Virtual Office Visit appointment type has been scheduled for 08/16/2018 with Darylene Price FNP.  Gaylord Shih, CMA 08/16/2018 8:27 AM

## 2018-08-16 NOTE — Telephone Encounter (Signed)
TELEPHONE CALL NOTE  Patrick SCHLARB has been deemed a candidate for a follow-up tele-health visit to limit community exposure during the Covid-19 pandemic. I spoke with the patient via phone to ensure availability of phone/video source, confirm preferred email & phone number, discuss instructions and expectations, and review consent.   I reminded Patrick Hurst to be prepared with any vital sign and/or heart rhythm information that could potentially be obtained via home monitoring, at the time of his visit.  Finally, I reminded Patrick Hurst to expect an e-mail containing a link for their video-based visit approximately 15 minutes before his visit, or alternatively, a phone call at the time of his visit if his visit is planned to be a phone encounter.  Did the patient verbally consent to treatment as below? YES  Patrick Hurst, CMA 08/16/2018 8:28 AM  CONSENT FOR TELE-HEALTH VISIT - PLEASE REVIEW  I hereby voluntarily request, consent and authorize [facility] and its employed or contracted physicians, physician assistants, nurse practitioners or other licensed health care professionals (the Practitioner), to provide me with telemedicine health care services (the "Services") as deemed necessary by the treating Practitioner. I acknowledge and consent to receive the Services by the Practitioner via telemedicine. I understand that the telemedicine visit will involve communicating with the Practitioner through telephonic communication technology and the disclosure of certain medical information by electronic transmission. I acknowledge that I have been given the opportunity to request an in-person assessment or other available alternative prior to the telemedicine visit and am voluntarily participating in the telemedicine visit.  I understand that I have the right to withhold or withdraw my consent to the use of telemedicine in the course of my care at any time, without affecting my  right to future care or treatment, and that the Practitioner or I may terminate the telemedicine visit at any time. I understand that I have the right to inspect all information obtained and/or recorded in the course of the telemedicine visit and may receive copies of available information for a reasonable fee.  I understand that some of the potential risks of receiving the Services via telemedicine include:  Marland Kitchen Delay or interruption in medical evaluation due to technological equipment failure or disruption; . Information transmitted may not be sufficient (e.g. poor resolution of images) to allow for appropriate medical decision making by the Practitioner; and/or  . In rare instances, security protocols could fail, causing a breach of personal health information.  Furthermore, I acknowledge that it is my responsibility to provide information about my medical history, conditions and care that is complete and accurate to the best of my ability. I acknowledge that Practitioner's advice, recommendations, and/or decision may be based on factors not within their control, such as incomplete or inaccurate data provided by me or lack of visual representation. I understand that the practice of medicine is not an exact science and that Practitioner makes no warranties or guarantees regarding treatment outcomes. I acknowledge that I will receive a copy of this consent concurrently upon execution via email to the email address I last provided but may also request a printed copy by calling the office of [facility].    I understand that my insurance may be billed for this visit.   I have read or had this consent read to me. . I understand the contents of this consent, which adequately explains the benefits and risks of the Services being provided via telemedicine.  Marland Kitchen  I have been provided ample opportunity to ask questions regarding this consent and the Services and have had my questions answered to my  satisfaction. . I give my informed consent for the services to be provided through the use of telemedicine in my medical care  By participating in this telemedicine visit I agree to the above.

## 2018-08-16 NOTE — Progress Notes (Signed)
Virtual Visit via Telephone Note   Evaluation Performed:  Follow-up visit  This visit type was conducted due to national recommendations for restrictions regarding the COVID-19 Pandemic (e.g. social distancing).  This format is felt to be most appropriate for this patient at this time.  All issues noted in this document were discussed and addressed.  No physical exam was performed (except for noted visual exam findings with Video Visits).  Please refer to the patient's chart (MyChart message for video visits and phone note for telephone visits) for the patient's consent to telehealth for [facility]  Date:  08/16/2018   ID:  Patrick Hurst, DOB 04-03-1956, MRN [id number]  Patient Location:  [address] [place] [address]   Provider location:   [facility] [place] [address] [place], [place] [address]  PCP:  [name], MD  Cardiologist:  No primary care provider on file.  Electrophysiologist:  None   Chief Complaint:  Fatigue  History of Present Illness:    Patrick Hurst is a 63 y.o. male who presents via audio/video conferencing for a telehealth visit today.  Patient verified DOB and address.  The patient does not have symptoms concerning for COVID-19 infection (fever, chills, cough, or new SHORTNESS OF BREATH).   Patient complains of minimal fatigue upon moderate exertion. He describes this as having been present for many months and has been improving such that he is getting ready to return to work. He has associated cough, shortness of breath (also improving) and minimal edema in his legs. He denies any dizziness, abdominal distention, palpitations, chest pain, sore throat or weight gain. Continues to wear his CPAP nightly.   Prior CV studies:   The following studies were reviewed today:  Echo report from 06/23/2018 reviewed and showed an EF of 50-55%.   Past Medical History:  Diagnosis Date  . Allergy   . Anemia   . Arthritis    . Bell's palsy   . CHF (congestive heart failure) ([place])   . CKD (chronic kidney disease)   . Depression   . Diabetes ([place])   . Gastric ulcer   . GERD (gastroesophageal reflux disease)   . Gout   . History of chicken pox   . Hyperlipidemia   . Hypertension   . Hypothyroidism   . OSA (obstructive sleep apnea)   . Thyroid disease   . Vitamin D deficiency    Past Surgical History:  Procedure Laterality Date  . CATARACT EXTRACTION Bilateral 2014 and 2015  . CHOLECYSTECTOMY  04/28/2011   Laproscopic; Dr. Pat Patrick  . GALLBLADDER SURGERY    . LASIK Bilateral 2019   medical  . SHOULDER SURGERY Right 2012   Dr. Leanor Kail     Current Meds  Medication Sig  . albuterol (PROAIR HFA) 108 (90 Base) MCG/ACT inhaler USE 2 INHALATIONS EVERY 4 HOURS AS NEEDED FOR WHEEZING OR SHORTNESS OF BREATH  . amitriptyline (ELAVIL) 25 MG tablet Take 1 tablet (25 mg total) by mouth at bedtime.  Marland Kitchen amLODipine (NORVASC) 10 MG tablet TAKE 1 TABLET DAILY  . Ascorbic Acid (VITAMIN C) 1000 MG tablet Take 1,000 mg by mouth 2 (two) times daily.   Marland Kitchen aspirin 81 MG tablet Take 81 mg by mouth daily.  Marland Kitchen atorvastatin (LIPITOR) 40 MG tablet Take 1 tablet (40 mg total) by mouth daily.  . calcitRIOL (ROCALTROL) 0.25 MCG capsule Take 0.25 mcg by mouth daily.   . cetirizine (ZYRTEC) 10  MG tablet Take 10 mg by mouth daily.   . Cyanocobalamin (B-12 PO) Take 1,000 mcg by mouth daily.   . fluticasone furoate-vilanterol (BREO ELLIPTA) 100-25 MCG/INH AEPB Inhale 1 puff into the lungs daily.  . hydrALAZINE (APRESOLINE) 25 MG tablet Take 1 tablet (25 mg total) by mouth 3 (three) times daily.  . insulin regular human CONCENTRATED (HUMULIN R) 500 UNIT/ML injection Take 20 U in morning and 30 U in evening, IF blood sugar is > 100 ( Check blood sugar before taking injection), If sugar level < 100- DO NOT take any insuline. (Patient taking differently: Inject 20 Units into the skin 2 (two) times daily with a meal. )  .  ipratropium-albuterol (DUONEB) 0.5-2.5 (3) MG/3ML SOLN Take 3 mLs by nebulization every 4 (four) hours as needed.  Marland Kitchen levothyroxine (SYNTHROID, LEVOTHROID) 100 MCG tablet Take 1 tablet (100 mcg total) by mouth daily.  . metoprolol tartrate (LOPRESSOR) 50 MG tablet TAKE 1 TABLET TWICE A DAY  . Misc. Devices (PULSE OXIMETER FOR FINGER) MISC 1 Device by Does not apply route daily as needed.  . pantoprazole (PROTONIX) 40 MG tablet Take 1 tablet (40 mg total) by mouth 2 (two) times daily.  Marland Kitchen testosterone cypionate (DEPO-TESTOSTERONE) 200 MG/ML injection Inject 1 mL (200 mg total) into the muscle every 14 (fourteen) days.  Marland Kitchen torsemide (DEMADEX) 20 MG tablet Take 2 tablets (40 mg total) by mouth daily.  Marland Kitchen ULORIC 80 MG TABS Take 80 mg by mouth daily.      Allergies:   Mucinex  [guaifenesin er] and Levaquin [levofloxacin]   Social History   Tobacco Use  . Smoking status: Former Smoker    Packs/day: 1.00    Years: 15.00    Pack years: 15.00    Types: Cigarettes    Last attempt to quit: 05/03/1978    Years since quitting: 40.3  . Smokeless tobacco: Former Systems developer  . Tobacco comment: started smoking at age 69  Substance Use Topics  . Alcohol use: No    Alcohol/week: 0.0 standard drinks  . Drug use: No     Family Hx: The patient's family history includes COPD in his mother; Heart disease in his father.  ROS:   Please see the history of present illness.     All other systems reviewed and are negative.   Labs/Other Tests and Data Reviewed:    Recent Labs: 02/20/2018: ALT 19; TSH 5.340 06/21/2018: B Natriuretic Peptide 244.0 06/22/2018: Hemoglobin 9.1; Platelets 124 06/26/2018: BUN 76; Creatinine, Ser 3.39; Potassium 4.1; Sodium 137   Recent Lipid Panel Lab Results  Component Value Date/Time   CHOL 101 02/20/2018 09:09 AM   CHOL 180 03/14/2012 03:21 AM   TRIG 208 (H) 02/20/2018 09:09 AM   TRIG 594 (H) 03/14/2012 03:21 AM   HDL 24 (L) 02/20/2018 09:09 AM   HDL 21 (L) 03/14/2012 03:21 AM    CHOLHDL 4.2 02/20/2018 09:09 AM   LDLCALC 35 02/20/2018 09:09 AM   LDLCALC SEE COMMENT 03/14/2012 03:21 AM    Wt Readings from Last 3 Encounters:  08/16/18 286 lb 4 oz (129.8 kg)  08/10/18 293 lb 3.2 oz (133 kg)  07/24/18 300 lb (136.1 kg)     Exam:    Vital Signs:  Wt 286 lb 4 oz (129.8 kg) Comment: self-reported  SpO2 94% Comment: self-reported  BMI 39.92 kg/m    Well nourished, well developed male in no  acute distress.   ASSESSMENT & PLAN:    1. Chronic heart failure  with preserved ejection fraction- - NYHA class II - euvolemic based on patient's description - weighing daily and says that his weight has been stable. Reminded him to call for an overnight weight gain of >2 pounds or a weekly weight gain of >5 pounds - not adding salt to his food and he's been diligent about reading food labels and is now using no salt butter - being active at home and walking quite a bit and is getting ready to return to work.   - wearing his CPAP on a nightly basis - saw pulmonology Alva Garnet) 08/10/2018  2: HTN- - not checking his BP at home - saw PCP Caryn Section) 07/24/2018 - BMP from 06/26/2018 reviewed and showed sodium 137, potassium 4.1, creatinine 3.39 and GFR 18  3: Diabetes- - glucose at home this morning was 142 - A1c 02/20/18 was 5.6%  COVID-19 Education: The signs and symptoms of COVID-19 were discussed with the patient and how to seek care for testing (follow up with PCP or arrange E-visit).  The importance of social distancing was discussed today.  Patient Risk:   After full review of this patients clinical status, I feel that they are at least moderate risk at this time.  Time:   Today, I have spent 10 minutes with the patient with telehealth technology discussing medications, weight and symptoms to report.  Medication Adjustments/Labs and Tests Ordered: Current medicines are reviewed at length with the patient today.  Concerns regarding medicines are outlined above.    Tests Ordered: No orders of the defined types were placed in this encounter.  Medication Changes: No orders of the defined types were placed in this encounter.   Disposition:  Follow-up 3 months or sooner for any questions/problems before then.   Signed, Alisa Graff, FNP  08/16/2018 11:27 AM    [facility]

## 2018-08-16 NOTE — Patient Instructions (Signed)
Continue weighing daily and call for an overnight weight gain of > 2 pounds or a weekly weight gain of >5 pounds. 

## 2018-08-17 ENCOUNTER — Ambulatory Visit: Payer: Commercial Managed Care - PPO | Admitting: Family

## 2018-08-18 ENCOUNTER — Other Ambulatory Visit: Payer: Self-pay | Admitting: Family Medicine

## 2018-08-18 MED ORDER — AMITRIPTYLINE HCL 25 MG PO TABS: 25.0000 mg | ORAL_TABLET | Freq: Every day | ORAL | 3 refills | Status: DC

## 2018-08-18 NOTE — Telephone Encounter (Signed)
[facility] faxed refill request for the following medications:  amitriptyline (ELAVIL) 25 MG tablet   Please advise.

## 2018-08-21 ENCOUNTER — Encounter: Payer: Self-pay | Admitting: Pulmonary Disease

## 2018-08-21 NOTE — Progress Notes (Signed)
PULMONARY OFFICE FOLLOW UP NOTE  Requesting MD/Service: [facility] hospitalists Date of initial consultation: 06/21/18 Reason for consultation: Acute on chronic respiratory failure  PT PROFILE: 63 y.o. male with minimal smoking history and chronic exertional dyspnea presented to [facility] ED on the morning of this admission with severe respiratory distress after 2 weeks of gradually progressive worsening dyspnea.  Also has history of OSA (compliant with nocturnal CPAP) and CKD with baseline creatinine approximately 3.20.  DATA: 06/23/18 Echocardiogram: LVEF 50-55%.  LV cavity size normal.  Diastolic parameters consistent with impaired relaxation.  LA mildly dilated.  RV cavity size normal.  RV systolic pressure mildly elevated.  RA size normal. 06/26/18 V/Q scan: Normal.  No perfusion or ventilation abnormalities noted. 08/03/18 CT chest: Linear band atelectasis in the RUL. Solitary ground-glass nodule in the RUL measuring 12 mm. Rounded 7 mm ground-glass nodule in the superior segment of the LLL  08/03/18 PFTs: FVC: 2.98 > 3.00 L (62 > 62 %pred), FEV1: 1.81 > 1.91 L (49 > 52 %pred), FEV1/FVC: 61%, TLC: 6.88 L (95 %pred), DLCO 73 %pred    INTERVAL: Treated by primary care physician with "head and chest" infection.  Completed Z-Pak.  SUBJ:  This is a scheduled follow-up.  Last seen by me 3/2.  Since that time, he has completed a course of azithromycin for a chest infection.  He is here to follow-up CT scan and pulmonary function test.  He continues to complain of moderate exertional dyspnea.  He continues to have scant green mucus.  He denies fever, hemoptysis.  He continues to have significant lower extremity edema unchanged from previously.  He is wearing compression stockings.   Vitals:   08/10/18 0952 08/10/18 0953  BP:  (!) 158/82  Pulse:  66  SpO2:  94%  Weight: 293 lb 3.2 oz (133 kg)   Height: 5\' 11"  (1.803 m)      EXAM:  Gen: NAD HEENT: NCAT, sclerae white Neck: No JVD Lungs: breath  sounds diminished without wheezes or other adventitious sounds Cardiovascular: RRR, no murmurs Abdomen: Soft, nontender, normal BS Ext: Compression stockings in place.  Symmetric pretibial and ankle edema Neuro: grossly intact Skin: Limited exam, no lesions noted    DATA:   BMP Latest Ref Rng & Units 06/26/2018 06/25/2018 06/24/2018  Glucose 70 - 99 mg/dL 219(H) 161(H) 185(H)  BUN 8 - 23 mg/dL 76(H) 66(H) 61(H)  Creatinine 0.61 - 1.24 mg/dL 3.39(H) 3.18(H) 3.22(H)  BUN/Creat Ratio 10 - 24 - - -  Sodium 135 - 145 mmol/L 137 139 139  Potassium 3.5 - 5.1 mmol/L 4.1 4.4 4.4  Chloride 98 - 111 mmol/L 98 102 105  CO2 22 - 32 mmol/L 26 27 27   Calcium 8.9 - 10.3 mg/dL 8.0(L) 8.2(L) 8.1(L)    CBC Latest Ref Rng & Units 06/22/2018 06/21/2018 06/21/2018  WBC 4.0 - 10.5 K/uL 8.0 7.6 8.1  Hemoglobin 13.0 - 17.0 g/dL 9.1(L) 8.8(L) 9.0(L)  Hematocrit 39.0 - 52.0 % 29.0(L) 27.4(L) 28.5(L)  Platelets 150 - 400 K/uL 124(L) 112(L) 116(L)    CXR: No new film  I have personally reviewed all chest radiographs reported above including CXRs and CT chest unless otherwise indicated  IMPRESSION:     ICD-10-CM   1. Exertional dyspnea, multifactorial R06.09   2. Moderate obesity E66.8   3. 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 Former (remote) smoker Z87.891   5. Incidental finding of 2 ground glass pulmonary nodules.  Low likelihood of malignancy  R91.8    Moderate COPD with significant gas trapping.  The CT scan findings of groundglass pulmonary nodules are incidental findings.  I doubt that they represent malignancy.  PLAN:  I instructed that he may return to work at any time.  During this COVID-19 pandemic, I urged him to take precautions of social distancing and wearing a facemask  We will begin a trial of Breo inhaler, 1 inhalation daily in the morning.  He is instructed to rinse his mouth after use.  I provided him a sample to be used 1 week on, one-week off, 1 week on.  If  he believes that the inhaler is beneficial (improves his breathing) he is to contact us and we will place an order for this medication or an equivalent  We discussed the importance of weight loss.  I emphasized that significant weight loss could dramatically improve his shortness of breath  Follow-up in 3-4 months.  He is to call sooner if needed  He will need a repeat CT scan in 6-12 months (between October 2020-April 2021)   Merton Border, MD PCCM service Mobile [phone] Pager [phone] 08/21/2018 11:12 AM

## 2018-08-22 ENCOUNTER — Other Ambulatory Visit: Payer: Self-pay | Admitting: Family Medicine

## 2018-08-22 DIAGNOSIS — E034 Atrophy of thyroid (acquired): Secondary | ICD-10-CM

## 2018-08-22 NOTE — Telephone Encounter (Signed)
[facility] faxed refill request for the following medications:  levothyroxine (SYNTHROID, LEVOTHROID) 100 MCG tablet    Please advise.

## 2018-08-22 NOTE — Telephone Encounter (Signed)
This was sent into local pharmacy 1 month ago by [place]. Need to cancel that prescription before sending refill to mail order.

## 2018-08-23 MED ORDER — LEVOTHYROXINE SODIUM 100 MCG PO TABS: 100.0000 ug | ORAL_TABLET | Freq: Every day | ORAL | 3 refills | Status: DC

## 2018-08-23 NOTE — Telephone Encounter (Signed)
Done. Prescription sent to mail order pharmacy.

## 2018-08-24 ENCOUNTER — Other Ambulatory Visit: Payer: Self-pay | Admitting: Pulmonary Disease

## 2018-08-24 MED ORDER — IPRATROPIUM-ALBUTEROL 0.5-2.5 (3) MG/3ML IN SOLN: 3.0000 mL | RESPIRATORY_TRACT | 2 refills | Status: DC | PRN

## 2018-08-29 ENCOUNTER — Telehealth: Payer: Self-pay | Admitting: Pulmonary Disease

## 2018-08-29 NOTE — Telephone Encounter (Signed)
Called and spoke to pt.  Pt stated that he does not feel that Breo 100 is effective, as he tried one week on and one week off with no change in sx. Pt reports of continuous sob with exertion and mild prod cough with greenish mucus. Denied fever, chills or sweats.  DS please advise. Thanks

## 2018-09-04 ENCOUNTER — Other Ambulatory Visit: Payer: Self-pay | Admitting: Family Medicine

## 2018-09-04 DIAGNOSIS — N529 Male erectile dysfunction, unspecified: Secondary | ICD-10-CM

## 2018-09-28 ENCOUNTER — Other Ambulatory Visit: Payer: Self-pay | Admitting: Family Medicine

## 2018-09-28 DIAGNOSIS — R0600 Dyspnea, unspecified: Secondary | ICD-10-CM

## 2018-09-28 DIAGNOSIS — R0609 Other forms of dyspnea: Secondary | ICD-10-CM

## 2018-10-09 ENCOUNTER — Telehealth: Payer: Self-pay | Admitting: Internal Medicine

## 2018-10-09 NOTE — Telephone Encounter (Signed)
Called and spoke to [place] and made them aware that death certificate should go to PCP, as pt did not pass in the hospital. Nothing further is needed at this time.

## 2018-10-09 NOTE — Telephone Encounter (Signed)
It appears that this encounter was created in error.  Please disregard.

## 2018-10-09 NOTE — Telephone Encounter (Addendum)
DS please see below message and advise. Thanks

## 2018-10-18 LAB — HM DIABETES EYE EXAM

## 2018-10-20 ENCOUNTER — Encounter: Payer: Self-pay | Admitting: Family Medicine

## 2018-10-20 DIAGNOSIS — E11319 Type 2 diabetes mellitus with unspecified diabetic retinopathy without macular edema: Secondary | ICD-10-CM | POA: Insufficient documentation

## 2018-10-20 DIAGNOSIS — E113593 Type 2 diabetes mellitus with proliferative diabetic retinopathy without macular edema, bilateral: Secondary | ICD-10-CM

## 2018-11-30 ENCOUNTER — Other Ambulatory Visit: Payer: Self-pay | Admitting: Pulmonary Disease

## 2018-12-07 ENCOUNTER — Telehealth: Payer: Self-pay | Admitting: Pulmonary Disease

## 2018-12-07 NOTE — Telephone Encounter (Signed)
Called patient for COVID-19 pre-screening for in office visit. ° °Have you recently traveled any where out of the local area in the last 2 weeks? No ° °Have you been in close contact with a person diagnosed with COVID-19 or someone awaiting results within the last 2 weeks? No ° °Do you currently have any of the following symptoms? If so, when did they start? °Cough     Diarrhea   Joint Pain °Fever      Muscle Pain   Red eyes °Shortness of breath   Abdominal pain  Vomiting °Loss of smell    Rash    Sore Throat °Headache    Weakness   Bruising or bleeding ° ° °Okay to proceed with visit 12/07/2018 °  ° ° °

## 2018-12-08 ENCOUNTER — Other Ambulatory Visit: Payer: Self-pay

## 2018-12-08 ENCOUNTER — Encounter: Payer: Self-pay | Admitting: Pulmonary Disease

## 2018-12-08 ENCOUNTER — Ambulatory Visit (INDEPENDENT_AMBULATORY_CARE_PROVIDER_SITE_OTHER): Payer: Commercial Managed Care - PPO | Admitting: Pulmonary Disease

## 2018-12-08 VITALS — BP 130/72 | HR 71 | Temp 98.4°F | Ht 71.0 in | Wt 301.2 lb

## 2018-12-08 DIAGNOSIS — R0609 Other forms of dyspnea: Secondary | ICD-10-CM

## 2018-12-08 DIAGNOSIS — J449 Chronic obstructive pulmonary disease, unspecified: Secondary | ICD-10-CM

## 2018-12-08 DIAGNOSIS — R918 Other nonspecific abnormal finding of lung field: Secondary | ICD-10-CM

## 2018-12-08 DIAGNOSIS — R0600 Dyspnea, unspecified: Secondary | ICD-10-CM

## 2018-12-08 DIAGNOSIS — J42 Unspecified chronic bronchitis: Secondary | ICD-10-CM | POA: Diagnosis not present

## 2018-12-08 MED ORDER — BREO ELLIPTA 100-25 MCG/INH IN AEPB: 1.0000 | INHALATION_SPRAY | Freq: Every day | RESPIRATORY_TRACT | 10 refills | Status: DC

## 2018-12-08 NOTE — Patient Instructions (Signed)
Resume Breo inhaler, 1 inhalation daily.  Rinse mouth after use.  Continue albuterol rescue inhaler or nebulizer as needed  To measure potential benefit of Breo inhaler, pay attention to shortness of breath with exertion, frequency of albuterol use, morning cough  Continue to work on weight loss as we discussed  Follow-up in 4-6 months.  Call sooner if needed

## 2018-12-08 NOTE — Progress Notes (Signed)
PULMONARY OFFICE FOLLOW UP NOTE  Requesting MD/Service: [facility] hospitalists Date of initial consultation: 06/21/18 Reason for consultation: Acute on chronic respiratory failure  PT PROFILE: 63 y.o. male with minimal smoking history and chronic exertional dyspnea presented to [facility] ED on the morning of this admission with severe respiratory distress after 2 weeks of gradually progressive worsening dyspnea.  Also has history of OSA (compliant with nocturnal CPAP) and CKD with baseline creatinine approximately 3.20.  DATA: 06/23/18 Echocardiogram: LVEF 50-55%.  LV cavity size normal.  Diastolic parameters consistent with impaired relaxation.  LA mildly dilated.  RV cavity size normal.  RV systolic pressure mildly elevated.  RA size normal. 06/26/18 V/Q scan: Normal.  No perfusion or ventilation abnormalities noted. 08/03/18 CT chest: Linear band atelectasis in the RUL. Solitary ground-glass nodule in the RUL measuring 12 mm. Rounded 7 mm ground-glass nodule in the superior segment of the LLL  08/03/18 PFTs: FVC: 2.98 > 3.00 L (62 > 62 %pred), FEV1: 1.81 > 1.91 L (49 > 52 %pred), FEV1/FVC: 61%, TLC: 6.88 L (95 %pred), DLCO 73 %pred    INTERVAL: Last visit 08/10/2018.  No major pulmonary events in the interim  SUBJ:  This is a scheduled follow-up.  Overall he is about the same.  He continues to have moderate exertional dyspnea.  He reports morning cough productive of moderately discolored mucus.  Otherwise, cough and sputum production is not a significant problem.  He denies chest pain or hemoptysis.  He has chronic lower extremity edema, not changed from baseline.  He uses albuterol inhaler 2-3 times per day.  Previously, I prescribed Breo inhaler.  He used the first inhaler, was uncertain of its benefit and therefore never got a refill.   Vitals:   12/08/18 0910  BP: 130/72  Pulse: 71  Temp: 98.4 F (36.9 C)  TempSrc: Oral  SpO2: 96%  Weight: (!) 301 lb 3.2 oz (136.6 kg)  Height: 5\' 11"  (1.803  m)  RA  EXAM:  Gen: Obese, NAD HEENT: NCAT, sclerae white Neck: No JVD noted (bearded) Lungs: breath sounds mildly diminished without wheezes or other adventitious sounds Cardiovascular: RRR, no murmurs Abdomen: Centripetal obesity, soft, nontender, normal BS Ext: Brace on R ankle, 2-3+ L pretibial, ankle edema Neuro: grossly intact Skin: Limited exam, no lesions noted   DATA:   BMP Latest Ref Rng & Units 06/26/2018 06/25/2018 06/24/2018  Glucose 70 - 99 mg/dL 219(H) 161(H) 185(H)  BUN 8 - 23 mg/dL 76(H) 66(H) 61(H)  Creatinine 0.61 - 1.24 mg/dL 3.39(H) 3.18(H) 3.22(H)  BUN/Creat Ratio 10 - 24 - - -  Sodium 135 - 145 mmol/L 137 139 139  Potassium 3.5 - 5.1 mmol/L 4.1 4.4 4.4  Chloride 98 - 111 mmol/L 98 102 105  CO2 22 - 32 mmol/L 26 27 27   Calcium 8.9 - 10.3 mg/dL 8.0(L) 8.2(L) 8.1(L)    CBC Latest Ref Rng & Units 06/22/2018 06/21/2018 06/21/2018  WBC 4.0 - 10.5 K/uL 8.0 7.6 8.1  Hemoglobin 13.0 - 17.0 g/dL 9.1(L) 8.8(L) 9.0(L)  Hematocrit 39.0 - 52.0 % 29.0(L) 27.4(L) 28.5(L)  Platelets 150 - 400 K/uL 124(L) 112(L) 116(L)    CXR: No new film  I have personally reviewed all chest radiographs reported above including CXRs and CT chest unless otherwise indicated  IMPRESSION:     ICD-10-CM   1. Moderate COPD (manifests as gas trapping on PFTs)  J44.9   2. Morbid obesity ([place])  E66.01   3. Moderate exertional dyspnea  R06.09   4.  Chronic bronchitis  J42   5. Incidental finding of 2 ground glass pulmonary nodules.  Low likelihood of malignancy  R91.8    The major cause of exertional dyspnea is likely obesity >COPD  The CT scan findings of groundglass pulmonary nodules are incidental findings.  I doubt that they represent malignancy.  The recommendation was for repeat CT chest in 6-12 months (Oct 2020 - April 2021)  PLAN:  Resume Breo 100-25, 1 inhalation daily.  Rinse mouth after use.  To assess potential benefit of this medication, I recommend that he pay attention to  exertional dyspnea, frequency of albuterol use, morning cough.  Continue albuterol as needed for increased shortness of breath, wheezing, chest tightness, cough  We discussed weight loss dietary strategies in detail.  He is committed to working on this.  Follow-up in 4-6 months with any provider.  At that time, repeat CT chest can be considered to follow the incidental finding of pulmonary nodules noted on CT 08/03/2018   Merton Border, MD PCCM service Mobile [phone] Pager [phone] 12/08/2018 9:38 AM

## 2018-12-10 NOTE — Progress Notes (Signed)
Patient ID: Patrick Hurst, male    DOB: 1956-01-17, 63 y.o.   MRN: [id number]  HPI  Patrick Hurst is a 63 y/o male with a history of DM, hyperlipidemia, HTN, CKD, thyroid disease, GERD, gout, anemia, depression, obstructive sleep apnea, bell's palsy, remote tobacco use and chronic heart failure.   Echo report from 06/23/2018 reviewed and showed an EF of 50-55%.  Admitted 06/21/2018 due to acute on chronic heart failure. Cardiology consult obtained. Initially give IV lasix and then transitioned to oral diuretics with a resultant loss of 12L. VQ scan showed low probability. Discharged after 5 days.   He presents today for a follow-up visit with a chief complaint of moderate shortness of breath upon minimal exertion. He describes this as chronic in nature having been present for several years. He does feel like his breathing has worsened as his weight has risen. He has associated fatigue, cough, pedal edema and gradual weight gain along with this. He denies any difficulty sleeping, abdominal distention, palpitations, chest pain, wheezing or dizziness. Is getting ready to begin a new inhaler but he hasn't picked it up yet.   Past Medical History:  Diagnosis Date  . Allergy   . Anemia   . Arthritis   . Bell's palsy   . CHF (congestive heart failure) ([facility])   . CKD (chronic kidney disease)   . Depression   . Diabetes ([place])   . Gastric ulcer   . GERD (gastroesophageal reflux disease)   . Gout   . History of chicken pox   . Hyperlipidemia   . Hypertension   . Hypothyroidism   . OSA (obstructive sleep apnea)   . Thyroid disease   . Vitamin D deficiency    Past Surgical History:  Procedure Laterality Date  . CATARACT EXTRACTION Bilateral 2014 and 2015  . CHOLECYSTECTOMY  04/28/2011   Laproscopic; Dr. Pat Patrick  . GALLBLADDER SURGERY    . LASIK Bilateral 2019   medical  . SHOULDER SURGERY Right 2012   Dr. Leanor Kail   Family History  Problem Relation Age of Onset  . COPD Mother   .  Heart disease Father    Social History   Tobacco Use  . Smoking status: Former Smoker    Packs/day: 1.00    Years: 15.00    Pack years: 15.00    Types: Cigarettes    Quit date: 05/03/1978    Years since quitting: 40.6  . Smokeless tobacco: Former Systems developer  . Tobacco comment: started smoking at age 66  Substance Use Topics  . Alcohol use: No    Alcohol/week: 0.0 standard drinks   Allergies  Allergen Reactions  . Mucinex  [Guaifenesin Er]     Throat swelling, increases heart rate  . Levaquin [Levofloxacin] Palpitations   Prior to Admission medications   Medication Sig Start Date End Date Taking? Authorizing Provider  albuterol (VENTOLIN HFA) 108 (90 Base) MCG/ACT inhaler USE 2 INHALATIONS EVERY 4 HOURS AS NEEDED FOR WHEEZING OR SHORTNESS OF BREATH 09/28/18  Yes [name], MD  amitriptyline (ELAVIL) 25 MG tablet Take 1 tablet (25 mg total) by mouth at bedtime. 08/18/18  Yes [name], MD  amLODipine (NORVASC) 10 MG tablet TAKE 1 TABLET DAILY 02/07/18  Yes [name], MD  Ascorbic Acid (VITAMIN C) 1000 MG tablet Take 1,000 mg by mouth 2 (two) times daily.    Yes [provider]  aspirin 81 MG tablet Take 81 mg by mouth daily.   Yes  [provider]  atorvastatin (LIPITOR) 40 MG tablet Take 1 tablet (40 mg total) by mouth daily. 01/26/18  Yes [name], MD  calcitRIOL (ROCALTROL) 0.25 MCG capsule Take 0.25 mcg by mouth daily.  06/19/18  Yes [provider]  cetirizine (ZYRTEC) 10 MG tablet Take 10 mg by mouth daily.    Yes [provider]  Cyanocobalamin (B-12 PO) Take 1,000 mcg by mouth daily.    Yes [provider]  fluticasone furoate-vilanterol (BREO ELLIPTA) 100-25 MCG/INH AEPB Inhale 1 puff into the lungs daily. 12/11/18  Yes Wilhelmina Mcardle, MD  hydrALAZINE (APRESOLINE) 25 MG tablet Take 1 tablet (25 mg total) by mouth 3 (three) times daily. 07/03/18  Yes Mar Daring, PA-C  insulin regular human CONCENTRATED  (HUMULIN R) 500 UNIT/ML injection Take 20 U in morning and 30 U in evening, IF blood sugar is > 100 ( Check blood sugar before taking injection), If sugar level < 100- DO NOT take any insuline. Patient taking differently: Inject 20 Units into the skin 2 (two) times daily with a meal.  11/21/17  Yes Vaughan Basta, MD  ipratropium-albuterol (DUONEB) 0.5-2.5 (3) MG/3ML SOLN INHALE 3 ML BY NEBULIZATION EVERY 4 HOURS AS NEEDED 11/30/18  Yes Wilhelmina Mcardle, MD  levothyroxine (SYNTHROID) 100 MCG tablet Take 1 tablet (100 mcg total) by mouth daily. 08/23/18  Yes [name], MD  metoprolol tartrate (LOPRESSOR) 50 MG tablet TAKE 1 TABLET TWICE A DAY 12/12/17  Yes [name], MD  Misc. Devices (PULSE OXIMETER FOR FINGER) MISC 1 Device by Does not apply route daily as needed. 07/10/18  Yes [name], MD  pantoprazole (PROTONIX) 40 MG tablet Take 1 tablet (40 mg total) by mouth 2 (two) times daily. 11/18/17  Yes [name], MD  tadalafil (CIALIS) 10 MG tablet TAKE ONE TABLET BY MOUTH DAILY AS NEEDED 09/05/18  Yes [name], MD  testosterone cypionate (DEPO-TESTOSTERONE) 200 MG/ML injection Inject 1 mL (200 mg total) into the muscle every 14 (fourteen) days. 07/24/18  Yes [name], MD  torsemide (DEMADEX) 20 MG tablet Take 2 tablets (40 mg total) by mouth daily. 06/27/18  Yes Dustin Flock, MD  ULORIC 80 MG TABS Take 80 mg by mouth daily.  05/18/17  Yes [provider]    Review of Systems  Constitutional: Positive for fatigue (with minimal exertion). Negative for appetite change.  HENT: Positive for rhinorrhea. Negative for congestion and sore throat.   Eyes: Negative.   Respiratory: Positive for cough (productive) and shortness of breath (with minimal exertion). Negative for wheezing.   Cardiovascular: Positive for leg swelling. Negative for chest pain and palpitations.  Gastrointestinal: Negative for abdominal distention and abdominal pain.  Endocrine:  Negative.   Genitourinary: Negative.   Musculoskeletal: Negative for back pain and neck pain.  Skin: Negative.   Allergic/Immunologic: Negative.   Neurological: Negative for dizziness and light-headedness.  Hematological: Negative for adenopathy. Does not bruise/bleed easily.  Psychiatric/Behavioral: Negative for dysphoric mood and sleep disturbance (wearing bipap at night; oxygen @2L ; sleeping on 1 pillow and wedge). The patient is not nervous/anxious.    Vitals:   12/11/18 1330  BP: (!) 166/76  Pulse: 85  Temp: 98.4 F (36.9 C)  TempSrc: Oral  SpO2: 95%  Weight: (!) 306 lb 3.2 oz (138.9 kg)  Height: 5\' 11"  (1.803 m)   Wt Readings from Last 3 Encounters:  12/11/18 (!) 306 lb 3.2 oz (138.9 kg)  12/08/18 (!) 301  lb 3.2 oz (136.6 kg)  08/16/18 286 lb 4 oz (129.8 kg)   Lab Results  Component Value Date   CREATININE 3.39 (H) 06/26/2018   CREATININE 3.18 (H) 06/25/2018   CREATININE 3.22 (H) 06/24/2018    Physical Exam Vitals signs and nursing note reviewed.  Constitutional:      Appearance: He is well-developed.  HENT:     Head: Normocephalic and atraumatic.  Neck:     Musculoskeletal: Normal range of motion and neck supple.     Vascular: No JVD.  Cardiovascular:     Rate and Rhythm: Normal rate and regular rhythm.  Pulmonary:     Effort: Pulmonary effort is normal. No respiratory distress.     Breath sounds: No wheezing or rales.  Abdominal:     Palpations: Abdomen is soft.     Tenderness: There is no abdominal tenderness.  Musculoskeletal:     Right lower leg: He exhibits no tenderness. Edema (trace pitting) present.     Left lower leg: He exhibits no tenderness. Edema (trace pitting) present.  Skin:    General: Skin is warm and dry.  Neurological:     General: No focal deficit present.     Mental Status: He is alert and oriented to person, place, and time.  Psychiatric:        Mood and Affect: Mood normal.        Behavior: Behavior normal.    Assessment &  Plan:  1: Chronic heart failure with preserved ejection fraction- - NYHA class III - euvolemic today - weighing daily and he was reminded to call for an overnight weight gain of >2 pounds or a weekly weight gain of >5 pounds - weight up 17 pounds from last visit here 6 months ago - not adding salt and has been reading food labels as well as rinsing canned foods  - wearing support socks daily with removal at bedtime - BNP 06/21/2018 was 244.0  2: HTN- - BP mildly elevated but he's gained weight since last here; encouraged increased activity inside the home since it's so hot/ humid outside - saw PCP Caryn Section) 07/24/2018 - BMP from 06/26/2018 reviewed and showed sodium 137, potassium 4.1, creatinine 3.39 and GFR 18  3: DM-  - A1c 02/20/18 was 5.6% - glucose today at home was 167 - follows with nephrology [name])   4: COPD- - saw pulmonologist Alva Garnet) 12/08/2018 - wearing bipap - wears oxygen at 2L QHS and PRN during the day or upon exertion  Medication list was reviewed.  Return in 6 months or sooner for any questions/problems before then.

## 2018-12-11 ENCOUNTER — Encounter: Payer: Self-pay | Admitting: Family

## 2018-12-11 ENCOUNTER — Other Ambulatory Visit: Payer: Self-pay

## 2018-12-11 ENCOUNTER — Ambulatory Visit: Payer: Commercial Managed Care - PPO | Attending: Family | Admitting: Family

## 2018-12-11 VITALS — BP 166/76 | HR 85 | Temp 98.4°F | Ht 71.0 in | Wt 306.2 lb

## 2018-12-11 DIAGNOSIS — M1990 Unspecified osteoarthritis, unspecified site: Secondary | ICD-10-CM | POA: Diagnosis not present

## 2018-12-11 DIAGNOSIS — E785 Hyperlipidemia, unspecified: Secondary | ICD-10-CM | POA: Insufficient documentation

## 2018-12-11 DIAGNOSIS — I5032 Chronic diastolic (congestive) heart failure: Secondary | ICD-10-CM | POA: Diagnosis present

## 2018-12-11 DIAGNOSIS — Z79890 Hormone replacement therapy (postmenopausal): Secondary | ICD-10-CM | POA: Insufficient documentation

## 2018-12-11 DIAGNOSIS — Z7982 Long term (current) use of aspirin: Secondary | ICD-10-CM | POA: Diagnosis not present

## 2018-12-11 DIAGNOSIS — J42 Unspecified chronic bronchitis: Secondary | ICD-10-CM

## 2018-12-11 DIAGNOSIS — E1140 Type 2 diabetes mellitus with diabetic neuropathy, unspecified: Secondary | ICD-10-CM

## 2018-12-11 DIAGNOSIS — E039 Hypothyroidism, unspecified: Secondary | ICD-10-CM | POA: Diagnosis not present

## 2018-12-11 DIAGNOSIS — G510 Bell's palsy: Secondary | ICD-10-CM | POA: Diagnosis not present

## 2018-12-11 DIAGNOSIS — Z794 Long term (current) use of insulin: Secondary | ICD-10-CM | POA: Insufficient documentation

## 2018-12-11 DIAGNOSIS — K219 Gastro-esophageal reflux disease without esophagitis: Secondary | ICD-10-CM | POA: Diagnosis not present

## 2018-12-11 DIAGNOSIS — I130 Hypertensive heart and chronic kidney disease with heart failure and stage 1 through stage 4 chronic kidney disease, or unspecified chronic kidney disease: Secondary | ICD-10-CM | POA: Diagnosis not present

## 2018-12-11 DIAGNOSIS — J449 Chronic obstructive pulmonary disease, unspecified: Secondary | ICD-10-CM | POA: Insufficient documentation

## 2018-12-11 DIAGNOSIS — E559 Vitamin D deficiency, unspecified: Secondary | ICD-10-CM | POA: Insufficient documentation

## 2018-12-11 DIAGNOSIS — N189 Chronic kidney disease, unspecified: Secondary | ICD-10-CM | POA: Diagnosis not present

## 2018-12-11 DIAGNOSIS — Z881 Allergy status to other antibiotic agents status: Secondary | ICD-10-CM | POA: Diagnosis not present

## 2018-12-11 DIAGNOSIS — Z87891 Personal history of nicotine dependence: Secondary | ICD-10-CM | POA: Insufficient documentation

## 2018-12-11 DIAGNOSIS — I10 Essential (primary) hypertension: Secondary | ICD-10-CM

## 2018-12-11 DIAGNOSIS — Z79899 Other long term (current) drug therapy: Secondary | ICD-10-CM | POA: Diagnosis not present

## 2018-12-11 DIAGNOSIS — E1122 Type 2 diabetes mellitus with diabetic chronic kidney disease: Secondary | ICD-10-CM | POA: Diagnosis not present

## 2018-12-11 DIAGNOSIS — Z8249 Family history of ischemic heart disease and other diseases of the circulatory system: Secondary | ICD-10-CM | POA: Diagnosis not present

## 2018-12-11 DIAGNOSIS — G4733 Obstructive sleep apnea (adult) (pediatric): Secondary | ICD-10-CM | POA: Diagnosis not present

## 2018-12-11 DIAGNOSIS — M109 Gout, unspecified: Secondary | ICD-10-CM | POA: Diagnosis not present

## 2018-12-11 DIAGNOSIS — Z7951 Long term (current) use of inhaled steroids: Secondary | ICD-10-CM | POA: Diagnosis not present

## 2018-12-11 MED ORDER — BREO ELLIPTA 100-25 MCG/INH IN AEPB: 1.0000 | INHALATION_SPRAY | Freq: Every day | RESPIRATORY_TRACT | 10 refills | Status: DC

## 2018-12-11 NOTE — Addendum Note (Signed)
Addended by: Maryanna Shape A on: 12/11/2018 11:21 AM   Modules accepted: Orders

## 2018-12-11 NOTE — Patient Instructions (Signed)
Continue weighing daily and call for an overnight weight gain of > 2 pounds or a weekly weight gain of >5 pounds. 

## 2018-12-14 ENCOUNTER — Other Ambulatory Visit: Payer: Self-pay

## 2018-12-14 MED ORDER — BREO ELLIPTA 100-25 MCG/INH IN AEPB: 1.0000 | INHALATION_SPRAY | Freq: Every day | RESPIRATORY_TRACT | 3 refills | Status: DC

## 2018-12-14 NOTE — Telephone Encounter (Signed)
Received 90 day supply request for Breo from [facility].  Rx has been sent to preferred pharmacy.

## 2018-12-30 ENCOUNTER — Other Ambulatory Visit: Payer: Self-pay | Admitting: Physician Assistant

## 2018-12-30 DIAGNOSIS — I10 Essential (primary) hypertension: Secondary | ICD-10-CM

## 2019-01-05 ENCOUNTER — Other Ambulatory Visit: Payer: Self-pay | Admitting: Family Medicine

## 2019-01-05 DIAGNOSIS — E1140 Type 2 diabetes mellitus with diabetic neuropathy, unspecified: Secondary | ICD-10-CM

## 2019-01-16 ENCOUNTER — Other Ambulatory Visit: Payer: Self-pay | Admitting: Family Medicine

## 2019-01-16 DIAGNOSIS — R7989 Other specified abnormal findings of blood chemistry: Secondary | ICD-10-CM

## 2019-01-16 MED ORDER — TESTOSTERONE CYPIONATE 200 MG/ML IM SOLN: 200.0000 mg | INTRAMUSCULAR | 5 refills | Status: DC

## 2019-01-16 NOTE — Telephone Encounter (Signed)
[facility] faxed refill request for the following medications: ° °testosterone cypionate (DEPO-TESTOSTERONE) 200 MG/ML injection  ° ° °Please advise. °

## 2019-01-16 NOTE — Telephone Encounter (Signed)
Please review

## 2019-02-10 ENCOUNTER — Other Ambulatory Visit: Payer: Self-pay | Admitting: Family Medicine

## 2019-03-01 ENCOUNTER — Other Ambulatory Visit: Payer: Self-pay

## 2019-03-01 ENCOUNTER — Emergency Department: Payer: Commercial Managed Care - PPO

## 2019-03-01 ENCOUNTER — Encounter: Payer: Self-pay | Admitting: Intensive Care

## 2019-03-01 ENCOUNTER — Inpatient Hospital Stay
Admission: EM | Admit: 2019-03-01 | Discharge: 2019-03-09 | DRG: 291 | Disposition: A | Discharge status: Home / Self Care | Payer: Commercial Managed Care - PPO | Attending: Family Medicine | Admitting: Family Medicine

## 2019-03-01 ENCOUNTER — Telehealth: Payer: Self-pay | Admitting: Pulmonary Disease

## 2019-03-01 DIAGNOSIS — I5033 Acute on chronic diastolic (congestive) heart failure: Secondary | ICD-10-CM | POA: Diagnosis present

## 2019-03-01 DIAGNOSIS — J188 Other pneumonia, unspecified organism: Secondary | ICD-10-CM

## 2019-03-01 DIAGNOSIS — I160 Hypertensive urgency: Secondary | ICD-10-CM | POA: Diagnosis present

## 2019-03-01 DIAGNOSIS — Z20828 Contact with and (suspected) exposure to other viral communicable diseases: Secondary | ICD-10-CM | POA: Diagnosis present

## 2019-03-01 DIAGNOSIS — E785 Hyperlipidemia, unspecified: Secondary | ICD-10-CM | POA: Diagnosis present

## 2019-03-01 DIAGNOSIS — Z79899 Other long term (current) drug therapy: Secondary | ICD-10-CM

## 2019-03-01 DIAGNOSIS — N179 Acute kidney failure, unspecified: Secondary | ICD-10-CM | POA: Diagnosis present

## 2019-03-01 DIAGNOSIS — Z794 Long term (current) use of insulin: Secondary | ICD-10-CM

## 2019-03-01 DIAGNOSIS — Z23 Encounter for immunization: Secondary | ICD-10-CM

## 2019-03-01 DIAGNOSIS — M351 Other overlap syndromes: Secondary | ICD-10-CM | POA: Diagnosis present

## 2019-03-01 DIAGNOSIS — R7989 Other specified abnormal findings of blood chemistry: Secondary | ICD-10-CM

## 2019-03-01 DIAGNOSIS — E662 Morbid (severe) obesity with alveolar hypoventilation: Secondary | ICD-10-CM | POA: Diagnosis present

## 2019-03-01 DIAGNOSIS — Z6841 Body Mass Index (BMI) 40.0 and over, adult: Secondary | ICD-10-CM

## 2019-03-01 DIAGNOSIS — N2581 Secondary hyperparathyroidism of renal origin: Secondary | ICD-10-CM | POA: Diagnosis present

## 2019-03-01 DIAGNOSIS — J9601 Acute respiratory failure with hypoxia: Secondary | ICD-10-CM | POA: Diagnosis present

## 2019-03-01 DIAGNOSIS — N185 Chronic kidney disease, stage 5: Secondary | ICD-10-CM | POA: Diagnosis present

## 2019-03-01 DIAGNOSIS — K7460 Unspecified cirrhosis of liver: Secondary | ICD-10-CM | POA: Diagnosis present

## 2019-03-01 DIAGNOSIS — Z881 Allergy status to other antibiotic agents status: Secondary | ICD-10-CM

## 2019-03-01 DIAGNOSIS — J189 Pneumonia, unspecified organism: Secondary | ICD-10-CM | POA: Diagnosis present

## 2019-03-01 DIAGNOSIS — I4891 Unspecified atrial fibrillation: Secondary | ICD-10-CM | POA: Diagnosis present

## 2019-03-01 DIAGNOSIS — E1165 Type 2 diabetes mellitus with hyperglycemia: Secondary | ICD-10-CM | POA: Diagnosis present

## 2019-03-01 DIAGNOSIS — D631 Anemia in chronic kidney disease: Secondary | ICD-10-CM | POA: Diagnosis present

## 2019-03-01 DIAGNOSIS — J449 Chronic obstructive pulmonary disease, unspecified: Secondary | ICD-10-CM | POA: Diagnosis not present

## 2019-03-01 DIAGNOSIS — R0603 Acute respiratory distress: Secondary | ICD-10-CM

## 2019-03-01 DIAGNOSIS — Z9049 Acquired absence of other specified parts of digestive tract: Secondary | ICD-10-CM

## 2019-03-01 DIAGNOSIS — I214 Non-ST elevation (NSTEMI) myocardial infarction: Secondary | ICD-10-CM

## 2019-03-01 DIAGNOSIS — E039 Hypothyroidism, unspecified: Secondary | ICD-10-CM | POA: Diagnosis present

## 2019-03-01 DIAGNOSIS — J440 Chronic obstructive pulmonary disease with acute lower respiratory infection: Secondary | ICD-10-CM | POA: Diagnosis present

## 2019-03-01 DIAGNOSIS — Z9842 Cataract extraction status, left eye: Secondary | ICD-10-CM

## 2019-03-01 DIAGNOSIS — Z79890 Hormone replacement therapy (postmenopausal): Secondary | ICD-10-CM

## 2019-03-01 DIAGNOSIS — J209 Acute bronchitis, unspecified: Secondary | ICD-10-CM | POA: Diagnosis present

## 2019-03-01 DIAGNOSIS — D696 Thrombocytopenia, unspecified: Secondary | ICD-10-CM | POA: Diagnosis present

## 2019-03-01 DIAGNOSIS — Z8679 Personal history of other diseases of the circulatory system: Secondary | ICD-10-CM

## 2019-03-01 DIAGNOSIS — F329 Major depressive disorder, single episode, unspecified: Secondary | ICD-10-CM | POA: Diagnosis present

## 2019-03-01 DIAGNOSIS — I5032 Chronic diastolic (congestive) heart failure: Secondary | ICD-10-CM

## 2019-03-01 DIAGNOSIS — E8881 Metabolic syndrome: Secondary | ICD-10-CM | POA: Diagnosis present

## 2019-03-01 DIAGNOSIS — Z888 Allergy status to other drugs, medicaments and biological substances status: Secondary | ICD-10-CM

## 2019-03-01 DIAGNOSIS — Z7982 Long term (current) use of aspirin: Secondary | ICD-10-CM

## 2019-03-01 DIAGNOSIS — G4733 Obstructive sleep apnea (adult) (pediatric): Secondary | ICD-10-CM

## 2019-03-01 DIAGNOSIS — R0602 Shortness of breath: Secondary | ICD-10-CM

## 2019-03-01 DIAGNOSIS — R918 Other nonspecific abnormal finding of lung field: Secondary | ICD-10-CM

## 2019-03-01 DIAGNOSIS — I132 Hypertensive heart and chronic kidney disease with heart failure and with stage 5 chronic kidney disease, or end stage renal disease: Principal | ICD-10-CM | POA: Diagnosis present

## 2019-03-01 DIAGNOSIS — I081 Rheumatic disorders of both mitral and tricuspid valves: Secondary | ICD-10-CM | POA: Diagnosis present

## 2019-03-01 DIAGNOSIS — E1122 Type 2 diabetes mellitus with diabetic chronic kidney disease: Secondary | ICD-10-CM | POA: Diagnosis present

## 2019-03-01 DIAGNOSIS — Z7951 Long term (current) use of inhaled steroids: Secondary | ICD-10-CM

## 2019-03-01 DIAGNOSIS — E1140 Type 2 diabetes mellitus with diabetic neuropathy, unspecified: Secondary | ICD-10-CM | POA: Diagnosis present

## 2019-03-01 DIAGNOSIS — Z8249 Family history of ischemic heart disease and other diseases of the circulatory system: Secondary | ICD-10-CM

## 2019-03-01 DIAGNOSIS — Z825 Family history of asthma and other chronic lower respiratory diseases: Secondary | ICD-10-CM

## 2019-03-01 DIAGNOSIS — Z9841 Cataract extraction status, right eye: Secondary | ICD-10-CM

## 2019-03-01 DIAGNOSIS — Z87891 Personal history of nicotine dependence: Secondary | ICD-10-CM

## 2019-03-01 DIAGNOSIS — I700 Atherosclerosis of aorta: Secondary | ICD-10-CM | POA: Diagnosis present

## 2019-03-01 DIAGNOSIS — Z9119 Patient's noncompliance with other medical treatment and regimen: Secondary | ICD-10-CM

## 2019-03-01 DIAGNOSIS — K219 Gastro-esophageal reflux disease without esophagitis: Secondary | ICD-10-CM | POA: Diagnosis present

## 2019-03-01 DIAGNOSIS — R778 Other specified abnormalities of plasma proteins: Secondary | ICD-10-CM

## 2019-03-01 DIAGNOSIS — J9691 Respiratory failure, unspecified with hypoxia: Secondary | ICD-10-CM | POA: Diagnosis present

## 2019-03-01 LAB — CBC
HCT: 26 % — ABNORMAL LOW (ref 39.0–52.0)
Hemoglobin: 8.7 g/dL — ABNORMAL LOW (ref 13.0–17.0)
MCH: 29.8 pg (ref 26.0–34.0)
MCHC: 33.5 g/dL (ref 30.0–36.0)
MCV: 89 fL (ref 80.0–100.0)
Platelets: 128 10*3/uL — ABNORMAL LOW (ref 150–400)
RBC: 2.92 MIL/uL — ABNORMAL LOW (ref 4.22–5.81)
RDW: 15.9 % — ABNORMAL HIGH (ref 11.5–15.5)
WBC: 7.6 10*3/uL (ref 4.0–10.5)
nRBC: 0 % (ref 0.0–0.2)

## 2019-03-01 LAB — BASIC METABOLIC PANEL
Anion gap: 12 (ref 5–15)
BUN: 78 mg/dL — ABNORMAL HIGH (ref 8–23)
CO2: 25 mmol/L (ref 22–32)
Calcium: 8.1 mg/dL — ABNORMAL LOW (ref 8.9–10.3)
Chloride: 99 mmol/L (ref 98–111)
Creatinine, Ser: 4.43 mg/dL — ABNORMAL HIGH (ref 0.61–1.24)
GFR calc Af Amer: 16 mL/min — ABNORMAL LOW (ref >60)
GFR calc non Af Amer: 13 mL/min — ABNORMAL LOW (ref >60)
Glucose, Bld: 217 mg/dL — ABNORMAL HIGH (ref 70–99)
Potassium: 4.1 mmol/L (ref 3.5–5.1)
Sodium: 136 mmol/L (ref 135–145)

## 2019-03-01 LAB — APTT: aPTT: 34 seconds (ref 24–36)

## 2019-03-01 LAB — PROTIME-INR
INR: 1 (ref 0.8–1.2)
Prothrombin Time: 13.3 seconds (ref 11.4–15.2)

## 2019-03-01 LAB — TROPONIN I (HIGH SENSITIVITY)
Troponin I (High Sensitivity): 205 ng/L (ref <18)
Troponin I (High Sensitivity): 200 ng/L (ref <18)

## 2019-03-01 LAB — GLUCOSE, CAPILLARY: Glucose-Capillary: 139 mg/dL — ABNORMAL HIGH (ref 70–99)

## 2019-03-01 LAB — PROCALCITONIN: Procalcitonin: 0.26 ng/mL

## 2019-03-01 MED ORDER — ONDANSETRON HCL 4 MG PO TABS: 4.0000 mg | ORAL_TABLET | Freq: Four times a day (QID) | ORAL | Status: DC | PRN

## 2019-03-01 MED ORDER — ACETAMINOPHEN 650 MG RE SUPP: 650.0000 mg | Freq: Four times a day (QID) | RECTAL | Status: DC | PRN

## 2019-03-01 MED ORDER — HEPARIN BOLUS VIA INFUSION
4000.0000 [IU] | Freq: Once | INTRAVENOUS | Status: AC
  Administered 2019-03-01: 4000 [IU] via INTRAVENOUS
  Filled 2019-03-01: qty 4000

## 2019-03-01 MED ORDER — FLUTICASONE FUROATE-VILANTEROL 100-25 MCG/INH IN AEPB
1.0000 | INHALATION_SPRAY | Freq: Every day | RESPIRATORY_TRACT | Status: DC
  Administered 2019-03-02 – 2019-03-09 (×8): 1 via RESPIRATORY_TRACT
  Filled 2019-03-01: qty 28

## 2019-03-01 MED ORDER — PANTOPRAZOLE SODIUM 40 MG PO TBEC
40.0000 mg | DELAYED_RELEASE_TABLET | Freq: Two times a day (BID) | ORAL | Status: DC
  Administered 2019-03-01 – 2019-03-09 (×16): 40 mg via ORAL
  Filled 2019-03-01 (×17): qty 1

## 2019-03-01 MED ORDER — INSULIN ASPART 100 UNIT/ML ~~LOC~~ SOLN
0.0000 [IU] | Freq: Three times a day (TID) | SUBCUTANEOUS | Status: DC
  Administered 2019-03-02: 7 [IU] via SUBCUTANEOUS
  Administered 2019-03-02: 13:00:00 5 [IU] via SUBCUTANEOUS
  Filled 2019-03-01 (×2): qty 1

## 2019-03-01 MED ORDER — SODIUM CHLORIDE 0.9 % IV SOLN
Freq: Once | INTRAVENOUS | Status: AC
  Administered 2019-03-02: via INTRAVENOUS

## 2019-03-01 MED ORDER — ALBUTEROL SULFATE HFA 108 (90 BASE) MCG/ACT IN AERS: 1.0000 | INHALATION_SPRAY | RESPIRATORY_TRACT | Status: DC | PRN

## 2019-03-01 MED ORDER — CALCITRIOL 0.25 MCG PO CAPS
0.5000 ug | ORAL_CAPSULE | Freq: Every day | ORAL | Status: DC
  Administered 2019-03-02 – 2019-03-09 (×8): 0.5 ug via ORAL
  Filled 2019-03-01 (×8): qty 2

## 2019-03-01 MED ORDER — SODIUM CHLORIDE 0.9 % IV SOLN
500.0000 mg | Freq: Once | INTRAVENOUS | Status: AC
  Administered 2019-03-01: 500 mg via INTRAVENOUS
  Filled 2019-03-01: qty 500

## 2019-03-01 MED ORDER — ONDANSETRON HCL 4 MG/2ML IJ SOLN: 4.0000 mg | Freq: Four times a day (QID) | INTRAMUSCULAR | Status: DC | PRN

## 2019-03-01 MED ORDER — HEPARIN (PORCINE) 25000 UT/250ML-% IV SOLN
1550.0000 [IU]/h | INTRAVENOUS | Status: DC
  Administered 2019-03-01: 1350 [IU]/h via INTRAVENOUS
  Administered 2019-03-02: 1550 [IU]/h via INTRAVENOUS
  Filled 2019-03-01 (×2): qty 250

## 2019-03-01 MED ORDER — VITAMIN B-12 1000 MCG PO TABS
1000.0000 ug | ORAL_TABLET | Freq: Every day | ORAL | Status: DC
  Administered 2019-03-02 – 2019-03-09 (×8): 1000 ug via ORAL
  Filled 2019-03-01 (×10): qty 1

## 2019-03-01 MED ORDER — LORATADINE 10 MG PO TABS
10.0000 mg | ORAL_TABLET | Freq: Every day | ORAL | Status: DC
  Administered 2019-03-01 – 2019-03-09 (×9): 10 mg via ORAL
  Filled 2019-03-01 (×9): qty 1

## 2019-03-01 MED ORDER — ACETAMINOPHEN 325 MG PO TABS: 650.0000 mg | ORAL_TABLET | Freq: Four times a day (QID) | ORAL | Status: DC | PRN

## 2019-03-01 MED ORDER — METHYLPREDNISOLONE SODIUM SUCC 125 MG IJ SOLR
125.0000 mg | Freq: Once | INTRAMUSCULAR | Status: AC
  Administered 2019-03-01: 125 mg via INTRAVENOUS
  Filled 2019-03-01: qty 2

## 2019-03-01 MED ORDER — AMITRIPTYLINE HCL 25 MG PO TABS
25.0000 mg | ORAL_TABLET | Freq: Every day | ORAL | Status: DC
  Administered 2019-03-01 – 2019-03-08 (×8): 25 mg via ORAL
  Filled 2019-03-01 (×8): qty 1

## 2019-03-01 MED ORDER — SENNA 8.6 MG PO TABS
1.0000 | ORAL_TABLET | Freq: Two times a day (BID) | ORAL | Status: DC
  Administered 2019-03-01 – 2019-03-08 (×14): 8.6 mg via ORAL
  Filled 2019-03-01 (×17): qty 1

## 2019-03-01 MED ORDER — INSULIN ASPART 100 UNIT/ML ~~LOC~~ SOLN: 0.0000 [IU] | Freq: Every day | SUBCUTANEOUS | Status: DC

## 2019-03-01 MED ORDER — HYDRALAZINE HCL 25 MG PO TABS
25.0000 mg | ORAL_TABLET | Freq: Three times a day (TID) | ORAL | Status: DC
  Administered 2019-03-01 – 2019-03-09 (×23): 25 mg via ORAL
  Filled 2019-03-01 (×24): qty 1

## 2019-03-01 MED ORDER — AMLODIPINE BESYLATE 10 MG PO TABS
10.0000 mg | ORAL_TABLET | Freq: Every day | ORAL | Status: DC
  Administered 2019-03-01 – 2019-03-03 (×3): 10 mg via ORAL
  Filled 2019-03-01 (×3): qty 1

## 2019-03-01 MED ORDER — IPRATROPIUM-ALBUTEROL 0.5-2.5 (3) MG/3ML IN SOLN
3.0000 mL | RESPIRATORY_TRACT | Status: DC | PRN
  Administered 2019-03-03: 3 mL via RESPIRATORY_TRACT
  Filled 2019-03-01: qty 3

## 2019-03-01 MED ORDER — SODIUM CHLORIDE 0.9 % IV SOLN
2.0000 g | Freq: Once | INTRAVENOUS | Status: AC
  Administered 2019-03-01: 2 g via INTRAVENOUS
  Filled 2019-03-01: qty 20

## 2019-03-01 MED ORDER — HEPARIN SODIUM (PORCINE) 5000 UNIT/ML IJ SOLN
5000.0000 [IU] | Freq: Three times a day (TID) | INTRAMUSCULAR | Status: DC
  Administered 2019-03-01: 5000 [IU] via SUBCUTANEOUS
  Filled 2019-03-01: qty 1

## 2019-03-01 MED ORDER — ALBUTEROL SULFATE (2.5 MG/3ML) 0.083% IN NEBU: 2.5000 mg | INHALATION_SOLUTION | RESPIRATORY_TRACT | Status: DC | PRN

## 2019-03-01 MED ORDER — LEVOTHYROXINE SODIUM 100 MCG PO TABS
100.0000 ug | ORAL_TABLET | Freq: Every day | ORAL | Status: DC
  Administered 2019-03-01 – 2019-03-09 (×9): 100 ug via ORAL
  Filled 2019-03-01 (×8): qty 1

## 2019-03-01 MED ORDER — METOPROLOL TARTRATE 50 MG PO TABS
50.0000 mg | ORAL_TABLET | Freq: Two times a day (BID) | ORAL | Status: DC
  Administered 2019-03-01 – 2019-03-03 (×5): 50 mg via ORAL
  Filled 2019-03-01 (×5): qty 1

## 2019-03-01 MED ORDER — ATORVASTATIN CALCIUM 20 MG PO TABS
40.0000 mg | ORAL_TABLET | Freq: Every day | ORAL | Status: DC
  Administered 2019-03-01 – 2019-03-09 (×9): 40 mg via ORAL
  Filled 2019-03-01 (×9): qty 2

## 2019-03-01 MED ORDER — INSULIN ASPART 100 UNIT/ML ~~LOC~~ SOLN
25.0000 [IU] | Freq: Two times a day (BID) | SUBCUTANEOUS | Status: DC
  Administered 2019-03-02: 25 [IU] via SUBCUTANEOUS
  Filled 2019-03-01: qty 1

## 2019-03-01 MED ORDER — POLYETHYLENE GLYCOL 3350 17 G PO PACK
17.0000 g | PACK | Freq: Every day | ORAL | Status: DC | PRN
  Administered 2019-03-06: 17 g via ORAL

## 2019-03-01 NOTE — ED Provider Notes (Signed)
[facility] Emergency Department Provider Note  ____________________________________________  Time seen: Approximately 8:24 PM  I have reviewed the triage vital signs and the nursing notes.   HISTORY  Chief Complaint Shortness of Breath and Cough    HPI Patrick Hurst is a 63 y.o. male with a history of CHF CKD diabetes hypertension and chronic bronchitis who comes the ED complaining of shortness of breath and productive cough that is been gradually worsening for the past 3 days.  Denies chest pain, denies fever chills or body aches.  Denies any known sick contacts or encounters with Covid positive individuals.  Has not been to any public events but does work at a truck stop.  He does endorse shortness of breath with exertion.  No alleviating factors.  Moderate severity.      Past Medical History:  Diagnosis Date  . Allergy   . Anemia   . Arthritis   . Bell's palsy   . CHF (congestive heart failure) ([place])   . CKD (chronic kidney disease)   . Depression   . Diabetes ([place])   . Gastric ulcer   . GERD (gastroesophageal reflux disease)   . Gout   . History of chicken pox   . Hyperlipidemia   . Hypertension   . Hypothyroidism   . OSA (obstructive sleep apnea)   . Thyroid disease   . Vitamin D deficiency      Patient Active Problem List   Diagnosis Date Noted  . COPD, moderate ([place]) 12/08/2018  . Diabetic retinopathy of both eyes without macular edema associated with type 2 diabetes mellitus ([place]) 10/20/2018  . Chronic diastolic (congestive) heart failure ([place]) 06/28/2018  . CKD (chronic kidney disease), stage IV ([place]) 06/21/2018  . Secondary hyperparathyroidism of renal origin ([place]) 06/20/2018  . Diastolic dysfunction [id number]  . Hyperkalemia 11/19/2017  . Psoriasis (a type of skin inflammation) 10/19/2016  . Primary osteoarthritis of both knees 10/01/2015  . Morbid (severe) obesity due to excess calories ([place]) 10/01/2015  . Hepatomegaly  09/19/2015  . Splenomegaly 09/19/2015  . ANA positive 07/21/2015  . Hepatic fibrosis 04/16/2015  . Charcot ankle 04/02/2015  . Thrombocytopenia ([place]) 02/14/2015  . Allergic rhinitis 02/12/2015  . Bell palsy 02/12/2015  . Carpal tunnel syndrome 02/12/2015  . Clinical depression 02/12/2015  . Diabetes mellitus with neuropathy ([place]) 02/12/2015  . Erectile dysfunction 02/12/2015  . HLD (hyperlipidemia) 02/12/2015  . Hypertension 02/12/2015  . Hypothyroidism 02/12/2015  . Anemia, iron deficiency 02/12/2015  . Low testosterone 02/12/2015  . Neuropathy 02/12/2015  . Peripheral neuropathy 02/12/2015  . Obstructive sleep apnea 02/12/2015  . Gout 05/05/2012  . Gastric ulcer 11/21/2009     Past Surgical History:  Procedure Laterality Date  . CATARACT EXTRACTION Bilateral 2014 and 2015  . CHOLECYSTECTOMY  04/28/2011   Laproscopic; Dr. Pat Patrick  . GALLBLADDER SURGERY    . LASIK Bilateral 2019   medical  . SHOULDER SURGERY Right 2012   Dr. Leanor Kail     Prior to Admission medications   Medication Sig Start Date End Date Taking? Authorizing Provider  insulin regular human CONCENTRATED (HUMULIN R) 500 UNIT/ML injection Inject up to 25 units twice a day, or as directed by physician 01/05/19  Yes [name], MD  albuterol (VENTOLIN HFA) 108 (90 Base) MCG/ACT inhaler USE 2 INHALATIONS EVERY 4 HOURS AS NEEDED FOR WHEEZING OR SHORTNESS OF BREATH 09/28/18   [name], MD  amitriptyline (ELAVIL) 25 MG tablet Take 1 tablet (25 mg total)  by mouth at bedtime. 08/18/18   [name], MD  amLODipine (NORVASC) 10 MG tablet TAKE 1 TABLET DAILY 02/07/18   [name], MD  Ascorbic Acid (VITAMIN C) 1000 MG tablet Take 1,000 mg by mouth 2 (two) times daily.     [provider]  aspirin 81 MG tablet Take 81 mg by mouth daily.    [provider]  atorvastatin (LIPITOR) 40 MG tablet Take 1 tablet (40 mg total) by mouth daily. 01/26/18   [name], MD  calcitRIOL  (ROCALTROL) 0.25 MCG capsule Take 0.25 mcg by mouth daily.  06/19/18   [provider]  cetirizine (ZYRTEC) 10 MG tablet Take 10 mg by mouth daily.     [provider]  Cyanocobalamin (B-12 PO) Take 1,000 mcg by mouth daily.     [provider]  fluticasone furoate-vilanterol (BREO ELLIPTA) 100-25 MCG/INH AEPB Inhale 1 puff into the lungs daily. 12/14/18   Wilhelmina Mcardle, MD  hydrALAZINE (APRESOLINE) 25 MG tablet TAKE 1 TABLET THREE TIMES A DAY 01/01/19   [name], MD  ipratropium-albuterol (DUONEB) 0.5-2.5 (3) MG/3ML SOLN INHALE 3 ML BY NEBULIZATION EVERY 4 HOURS AS NEEDED 11/30/18   Wilhelmina Mcardle, MD  levothyroxine (SYNTHROID) 100 MCG tablet Take 1 tablet (100 mcg total) by mouth daily. 08/23/18   [name], MD  metoprolol tartrate (LOPRESSOR) 50 MG tablet TAKE 1 TABLET TWICE A DAY 12/12/17   [name], MD  Misc. Devices (PULSE OXIMETER FOR FINGER) MISC 1 Device by Does not apply route daily as needed. 07/10/18   [name], MD  pantoprazole (PROTONIX) 40 MG tablet TAKE 1 TABLET TWICE A DAY 02/10/19   [name], MD  tadalafil (CIALIS) 10 MG tablet TAKE ONE TABLET BY MOUTH DAILY AS NEEDED 09/05/18   [name], MD  testosterone cypionate (DEPO-TESTOSTERONE) 200 MG/ML injection Inject 1 mL (200 mg total) into the muscle every 14 (fourteen) days. 01/16/19   [name], MD  torsemide (DEMADEX) 20 MG tablet Take 2 tablets (40 mg total) by mouth daily. 06/27/18   Dustin Flock, MD  ULORIC 80 MG TABS Take 80 mg by mouth daily.  05/18/17   [provider]     Allergies Mucinex  [guaifenesin er] and Levaquin [levofloxacin]   Family History  Problem Relation Age of Onset  . COPD Mother   . Heart disease Father     Social History Social History   Tobacco Use  . Smoking status: Former Smoker    Packs/day: 1.00    Years: 15.00    Pack years: 15.00    Types: Cigarettes    Quit date: 05/03/1978    Years since quitting:  40.8  . Smokeless tobacco: Former Systems developer  . Tobacco comment: started smoking at age 51  Substance Use Topics  . Alcohol use: No    Alcohol/week: 0.0 standard drinks  . Drug use: No    Review of Systems  Constitutional:   No fever or chills.  ENT:   No sore throat. No rhinorrhea. Cardiovascular:   No chest pain or syncope. Respiratory:   Positive shortness of breath and productive cough. Gastrointestinal:   Negative for abdominal pain, vomiting and diarrhea.  Musculoskeletal:   Negative for focal pain or swelling All other systems reviewed and are negative except as documented above in ROS and HPI.  ____________________________________________   PHYSICAL EXAM:  VITAL SIGNS: ED Triage Vitals  Enc Vitals Group  BP 03/01/19 1724 (!) 167/74     Pulse Rate 03/01/19 1724 83     Resp 03/01/19 1724 (!) 24     Temp 03/01/19 1724 98.4 F (36.9 C)     Temp Source 03/01/19 1724 Oral     SpO2 03/01/19 1724 (!) 86 %     Weight 03/01/19 1725 294 lb (133.4 kg)     Height 03/01/19 1725 5\' 11"  (1.803 m)     Head Circumference --      Peak Flow --      Pain Score 03/01/19 1725 0     Pain Loc --      Pain Edu? --      Excl. in [place]? --     Vital signs reviewed, nursing assessments reviewed.   Constitutional:   Alert and oriented. Non-toxic appearance. Eyes:   Conjunctivae are normal. EOMI. PERRL. ENT      Head:   Normocephalic and atraumatic.      Nose:   Wearing a mask.      Mouth/Throat:   Wearing a mask.      Neck:   No meningismus. Full ROM. Hematological/Lymphatic/Immunilogical:   No cervical lymphadenopathy. Cardiovascular:   RRR. Symmetric bilateral radial and DP pulses.  No murmurs. Cap refill less than 2 seconds. Respiratory: Normal work of breathing.  Positive for tachypnea..  Scattered basilar crackles. Gastrointestinal:   Soft and nontender. Non distended. There is no CVA tenderness.  No rebound, rigidity, or guarding.  Musculoskeletal:   Normal range of motion in all  extremities. No joint effusions.  No lower extremity tenderness.  No edema. Neurologic:   Normal speech and language.  Motor grossly intact. No acute focal neurologic deficits are appreciated.  Skin:    Skin is warm, dry and intact. No rash noted.  No petechiae, purpura, or bullae.  ____________________________________________    LABS (pertinent positives/negatives) (all labs ordered are listed, but only abnormal results are displayed) Labs Reviewed  BASIC METABOLIC PANEL - Abnormal; Notable for the following components:      Result Value   Glucose, Bld 217 (*)    BUN 78 (*)    Creatinine, Ser 4.43 (*)    Calcium 8.1 (*)    GFR calc non Af Amer 13 (*)    GFR calc Af Amer 16 (*)    All other components within normal limits  CBC - Abnormal; Notable for the following components:   RBC 2.92 (*)    Hemoglobin 8.7 (*)    HCT 26.0 (*)    RDW 15.9 (*)    Platelets 128 (*)    All other components within normal limits  TROPONIN I (HIGH SENSITIVITY) - Abnormal; Notable for the following components:   Troponin I (High Sensitivity) 205 (*)    All other components within normal limits  TROPONIN I (HIGH SENSITIVITY) - Abnormal; Notable for the following components:   Troponin I (High Sensitivity) 200 (*)    All other components within normal limits  SARS CORONAVIRUS 2 (TAT 6-24 HRS)  PROTIME-INR  APTT  HEPARIN LEVEL (UNFRACTIONATED)  CBC  PROCALCITONIN   ____________________________________________   EKG  Interpreted by me Sinus rhythm rate of 80, normal axis intervals QRS and ST segments.  Slight T wave inversion in lead V6 which is old compared to prior EKGs.  ____________________________________________    RADIOLOGY  Dg Chest 2 View  Result Date: 03/01/2019 CLINICAL DATA:  Shortness of breath and productive cough for the past week. EXAM: CHEST -  2 VIEW COMPARISON:  CT chest dated August 03, 2018. Chest x-ray dated June 25, 2018. FINDINGS: The heart is at the upper  limits of normal in size. Normal pulmonary vascularity. No focal consolidation, pleural effusion, or pneumothorax. Unchanged scarring in the right upper lobe along the minor fissure. No acute osseous abnormality. IMPRESSION: No active cardiopulmonary disease. Electronically Signed   By: Titus Dubin M.D.   On: 03/01/2019 18:09   Ct Chest Wo Contrast  Result Date: 03/01/2019 CLINICAL DATA:  Chronic dyspnea EXAM: CT CHEST WITHOUT CONTRAST TECHNIQUE: Multidetector CT imaging of the chest was performed following the standard protocol without IV contrast. COMPARISON:  08/03/2018 FINDINGS: Cardiovascular: Somewhat limited due to lack of IV contrast. Aortic calcifications are seen without aneurysmal dilatation. No significant cardiac enlargement is noted. Coronary calcifications are seen. The pulmonary artery does not appear significantly enlarged. Mediastinum/Nodes: Thoracic inlet is within normal limits. Scattered small hilar and mediastinal lymph nodes are seen. Prominent subcarinal lymph node is noted measuring 18 mm in short axis. Esophagus as visualized is within normal limits. Lungs/Pleura: Patchy acute pneumonia is noted within the right lung primarily within the right lower lobe but to a lesser degree within the right upper lobe along the fissure. The left lung demonstrates some patchy opacities as well but to a lesser degree than that on the right. No effusion or pneumothorax is noted. Upper Abdomen: Visualized upper abdomen shows changes of prior cholecystectomy. No other focal abnormality is seen. Musculoskeletal: Degenerative changes of the thoracic spine are noted. No acute bony abnormality is seen. IMPRESSION: Patchy multifocal parenchymal opacities within both lungs right greater than left consistent with multifocal pneumonia. Aortic Atherosclerosis (ICD10-I70.0). Electronically Signed   By: Inez Catalina M.D.   On: 03/01/2019 19:18     ____________________________________________   PROCEDURES .Critical Care Performed by: Carrie Mew, MD Authorized by: Carrie Mew, MD   Critical care provider statement:    Critical care time (minutes):  35   Critical care time was exclusive of:  Separately billable procedures and treating other patients   Critical care was necessary to treat or prevent imminent or life-threatening deterioration of the following conditions:  Cardiac failure and respiratory failure   Critical care was time spent personally by me on the following activities:  Development of treatment plan with patient or surrogate, discussions with consultants, evaluation of patient's response to treatment, examination of patient, obtaining history from patient or surrogate, ordering and performing treatments and interventions, ordering and review of laboratory studies, ordering and review of radiographic studies, pulse oximetry, re-evaluation of patient's condition and review of old charts    ____________________________________________  DIFFERENTIAL DIAGNOSIS   Pneumonia, pleural effusion, pulmonary edema, non-STEMI, COVID-19, pulmonary embolism   CLINICAL IMPRESSION / ASSESSMENT AND PLAN / ED COURSE  Medications ordered in the ED: Medications  heparin ADULT infusion 100 units/mL (25000 units/219mL sodium chloride 0.45%) (1,350 Units/hr Intravenous New Bag/Given 03/01/19 1930)  methylPREDNISolone sodium succinate (SOLU-MEDROL) 125 mg/2 mL injection 125 mg (has no administration in time range)  cefTRIAXone (ROCEPHIN) 2 g in sodium chloride 0.9 % 100 mL IVPB (has no administration in time range)  azithromycin (ZITHROMAX) 500 mg in sodium chloride 0.9 % 250 mL IVPB (has no administration in time range)  heparin bolus via infusion 4,000 Units (4,000 Units Intravenous Bolus from Bag 03/01/19 1930)    Pertinent labs & imaging results that were available during my care of the patient were reviewed by me  and considered in my medical decision making (see chart  for details).  TERRILL MAKOVEC was evaluated in Emergency Department on 03/01/2019 for the symptoms described in the history of present illness. He was evaluated in the context of the global COVID-19 pandemic, which necessitated consideration that the patient might be at risk for infection with the SARS-CoV-2 virus that causes COVID-19. Institutional protocols and algorithms that pertain to the evaluation of patients at risk for COVID-19 are in a state of rapid change based on information released by regulatory bodies including the CDC and federal and state organizations. These policies and algorithms were followed during the patient's care in the ED.   Patient presents with shortness of breath, worse with exertion.  Chest x-ray is nondiagnostic, labs show a mild troponin elevation.  Due to his advanced CKD, unable to obtain a CT angiogram of the chest to rule out PE.  Therefore I will start him on heparin for now which will treat possible non-STEMI versus PE.  Noncontrast CT does show multifocal infiltrates likely pneumonia for which I will give him Solu-Medrol ceftriaxone and azithromycin.  Discussed with the hospitalist for further management.  Covid test is pending.      ____________________________________________   FINAL CLINICAL IMPRESSION(S) / ED DIAGNOSES    Final diagnoses:  Acute respiratory failure with hypoxia (HCC)  Multifocal pneumonia  Non-STEMI (non-ST elevated myocardial infarction) [facility])     ED Discharge Orders    None      Portions of this note were generated with dragon dictation software. Dictation errors may occur despite best attempts at proofreading.   Carrie Mew, MD 03/01/19 2027

## 2019-03-01 NOTE — Telephone Encounter (Signed)
Spoke to pt, who stated that his oxygen is dropping with exertion. spo2 is dropping in the low 80's on roomair. Pt does not use oxygen, as this was discontinued by Dr. Alva Garnet.  Pt also reports of prod cough with green mucus occ mixed with bright red blood and the size of a quarter. Pt stated these sx developed about one week ago.  I have spoken to Dr. Patsey Berthold verbally- who recommended  ED for eval.  Pt is aware of recommendations and voiced his understanding.  Nothing further is needed.

## 2019-03-01 NOTE — ED Notes (Signed)
Patient placed on 2L O2 

## 2019-03-01 NOTE — H&P (Signed)
Patrick Hurst NAME: Patrick Hurst    MR#:  [id number]  DATE OF BIRTH:  Sep 02, 1955  DATE OF ADMISSION:  03/01/2019  PRIMARY CARE PHYSICIAN: [name], MD   REQUESTING/REFERRING PHYSICIAN: dr Joni Fears  CHIEF COMPLAINT:   Increasing shortness of breath and cough. Patient was found to have hypoxia with sats 86% in the emergency room HISTORY OF PRESENT ILLNESS:  Patrick Hurst  is a 63 y.o. male with a known history of CKD stage IV, congestive heart failure, depression, diabetes with chronic kidney disease hypertension hyperlipidemia and obstructive sleep apnea uses CPAP comes to the emergency room with increasing shortness of breath more than usual with cough acute on chronic. Patient denies any fever nausea vomiting bodyaches.  Patient was found to have multifocal pneumonia chest x-ray. He is getting Rocephin and Zithromax. Currently his saturations are 93 to 95% on 2 L. White count is normal. Pro calcitonin pending   PAST MEDICAL HISTORY:   Past Medical History:  Diagnosis Date  . Allergy   . Anemia   . Arthritis   . Bell's palsy   . CHF (congestive heart failure) ([place])   . CKD (chronic kidney disease)   . Depression   . Diabetes ([place])   . Gastric ulcer   . GERD (gastroesophageal reflux disease)   . Gout   . History of chicken pox   . Hyperlipidemia   . Hypertension   . Hypothyroidism   . OSA (obstructive sleep apnea)   . Thyroid disease   . Vitamin D deficiency     PAST SURGICAL HISTOIRY:   Past Surgical History:  Procedure Laterality Date  . CATARACT EXTRACTION Bilateral 2014 and 2015  . CHOLECYSTECTOMY  04/28/2011   Laproscopic; Dr. Pat Patrick  . GALLBLADDER SURGERY    . LASIK Bilateral 2019   medical  . SHOULDER SURGERY Right 2012   Dr. Leanor Kail    SOCIAL HISTORY:   Social History   Tobacco Use  . Smoking status: Former Smoker    Packs/day: 1.00    Years: 15.00    Pack years: 15.00     Types: Cigarettes    Quit date: 05/03/1978    Years since quitting: 40.8  . Smokeless tobacco: Former Systems developer  . Tobacco comment: started smoking at age 71  Substance Use Topics  . Alcohol use: No    Alcohol/week: 0.0 standard drinks    FAMILY HISTORY:   Family History  Problem Relation Age of Onset  . COPD Mother   . Heart disease Father     DRUG ALLERGIES:   Allergies  Allergen Reactions  . Guaifenesin Swelling    Throat swelling, increased heart rate.  . Mucinex  [Guaifenesin Er]     Throat swelling, increases heart rate  . Levaquin [Levofloxacin] Palpitations    REVIEW OF SYSTEMS:  Review of Systems  Constitutional: Positive for malaise/fatigue. Negative for chills, fever and weight loss.  HENT: Negative for ear discharge, ear pain and nosebleeds.   Eyes: Negative for blurred vision, pain and discharge.  Respiratory: Positive for cough and shortness of breath. Negative for sputum production, wheezing and stridor.   Cardiovascular: Negative for chest pain, palpitations, orthopnea and PND.  Gastrointestinal: Positive for nausea. Negative for abdominal pain, diarrhea and vomiting.  Genitourinary: Negative for frequency and urgency.  Musculoskeletal: Negative for back pain and joint pain.  Neurological: Positive for weakness. Negative for sensory change, speech change and focal weakness.  Psychiatric/Behavioral: Negative  for depression and hallucinations. The patient is not nervous/anxious.      MEDICATIONS AT HOME:   Prior to Admission medications   Medication Sig Start Date End Date Taking? Authorizing Provider  albuterol (VENTOLIN HFA) 108 (90 Base) MCG/ACT inhaler USE 2 INHALATIONS EVERY 4 HOURS AS NEEDED FOR WHEEZING OR SHORTNESS OF BREATH 09/28/18  Yes [name], MD  amitriptyline (ELAVIL) 25 MG tablet Take 1 tablet (25 mg total) by mouth at bedtime. 08/18/18  Yes [name], MD  amLODipine (NORVASC) 10 MG tablet TAKE 1 TABLET DAILY 02/07/18  Yes [name], MD  Ascorbic Acid (VITAMIN C) 1000 MG tablet Take 1,000 mg by mouth 2 (two) times daily.    Yes [provider]  aspirin 81 MG tablet Take 81 mg by mouth daily.   Yes [provider]  atorvastatin (LIPITOR) 40 MG tablet Take 1 tablet (40 mg total) by mouth daily. 01/26/18  Yes [name], MD  calcitRIOL (ROCALTROL) 0.5 MCG capsule Take 0.5 mcg by mouth daily. 12/29/18  Yes [provider]  cetirizine (ZYRTEC) 10 MG tablet Take 10 mg by mouth daily.    Yes [provider]  fluticasone furoate-vilanterol (BREO ELLIPTA) 100-25 MCG/INH AEPB Inhale 1 puff into the lungs daily. 12/14/18  Yes Wilhelmina Mcardle, MD  hydrALAZINE (APRESOLINE) 25 MG tablet TAKE 1 TABLET THREE TIMES A DAY 01/01/19  Yes [name], MD  insulin regular human CONCENTRATED (HUMULIN R) 500 UNIT/ML injection Inject up to 25 units twice a day, or as directed by physician 01/05/19  Yes [name], MD  ipratropium-albuterol (DUONEB) 0.5-2.5 (3) MG/3ML SOLN INHALE 3 ML BY NEBULIZATION EVERY 4 HOURS AS NEEDED 11/30/18  Yes Wilhelmina Mcardle, MD  levothyroxine (SYNTHROID) 100 MCG tablet Take 1 tablet (100 mcg total) by mouth daily. 08/23/18  Yes [name], MD  metoprolol tartrate (LOPRESSOR) 50 MG tablet TAKE 1 TABLET TWICE A DAY 12/12/17  Yes [name], MD  pantoprazole (PROTONIX) 40 MG tablet TAKE 1 TABLET TWICE A DAY 02/10/19  Yes [name], MD  torsemide (DEMADEX) 20 MG tablet Take 2 tablets (40 mg total) by mouth daily. 06/27/18  Yes Dustin Flock, MD  ULORIC 80 MG TABS Take 80 mg by mouth daily.  05/18/17  Yes [provider]  vitamin B-12 (CYANOCOBALAMIN) 1000 MCG tablet Take 1,000 mcg by mouth daily.   Yes [provider]  Misc. Devices (PULSE OXIMETER FOR FINGER) MISC 1 Device by Does not apply route daily as needed. 07/10/18   [name], MD  tadalafil (CIALIS) 10 MG tablet TAKE ONE TABLET BY MOUTH DAILY AS NEEDED 09/05/18   [name], MD  testosterone cypionate (DEPO-TESTOSTERONE) 200 MG/ML injection Inject 1 mL (200 mg total) into the muscle every 14 (fourteen) days. 01/16/19   [name], MD      VITAL SIGNS:  Blood pressure (!) 167/74, pulse 83, temperature 98.4 F (36.9 C), temperature source Oral, resp. rate (!) 24, height 5\' 11"  (1.803 m), weight 133.4 kg, SpO2 (!) 86 %.  PHYSICAL EXAMINATION:  GENERAL:  63 y.o.-year-old patient lying in the bed with held acute distress. Obese EYES: Pupils equal, round, reactive to light and accommodation. No scleral icterus. Extraocular muscles intact.  HEENT: Head atraumatic, normocephalic. Oropharynx and nasopharynx clear.  NECK:  Supple, no jugular venous distention. No thyroid enlargement, no tenderness.  LUNGS: stent breath sounds bilaterally, no wheezing, rales,rhonchi or crepitation. No use  of accessory muscles of respiration.  CARDIOVASCULAR: S1, S2 normal. No murmurs, rubs, or gallops.  ABDOMEN: Soft, nontender, nondistended. Bowel sounds present. No organomegaly or mass.  EXTREMITIES chronic leg edema with venous stasis changes NEUROLOGIC: Cranial nerves II through XII are intact. Muscle strength 5/5 in all extremities. Sensation intact. Gait not checked.  PSYCHIATRIC: The patient is alert and oriented x 3.  SKIN: No obvious rash, lesion, or ulcer.   LABORATORY PANEL:   CBC Recent Labs  Lab 03/01/19 1733  WBC 7.6  HGB 8.7*  HCT 26.0*  PLT 128*   ------------------------------------------------------------------------------------------------------------------  Chemistries  Recent Labs  Lab 03/01/19 1733  NA 136  K 4.1  CL 99  CO2 25  GLUCOSE 217*  BUN 78*  CREATININE 4.43*  CALCIUM 8.1*   ------------------------------------------------------------------------------------------------------------------  Cardiac Enzymes No results for input(s): TROPONINI in the last 168  hours. ------------------------------------------------------------------------------------------------------------------  RADIOLOGY:  Dg Chest 2 View  Result Date: 03/01/2019 CLINICAL DATA:  Shortness of breath and productive cough for the past week. EXAM: CHEST - 2 VIEW COMPARISON:  CT chest dated August 03, 2018. Chest x-ray dated June 25, 2018. FINDINGS: The heart is at the upper limits of normal in size. Normal pulmonary vascularity. No focal consolidation, pleural effusion, or pneumothorax. Unchanged scarring in the right upper lobe along the minor fissure. No acute osseous abnormality. IMPRESSION: No active cardiopulmonary disease. Electronically Signed   By: Titus Dubin M.D.   On: 03/01/2019 18:09   Ct Chest Wo Contrast  Result Date: 03/01/2019 CLINICAL DATA:  Chronic dyspnea EXAM: CT CHEST WITHOUT CONTRAST TECHNIQUE: Multidetector CT imaging of the chest was performed following the standard protocol without IV contrast. COMPARISON:  08/03/2018 FINDINGS: Cardiovascular: Somewhat limited due to lack of IV contrast. Aortic calcifications are seen without aneurysmal dilatation. No significant cardiac enlargement is noted. Coronary calcifications are seen. The pulmonary artery does not appear significantly enlarged. Mediastinum/Nodes: Thoracic inlet is within normal limits. Scattered small hilar and mediastinal lymph nodes are seen. Prominent subcarinal lymph node is noted measuring 18 mm in short axis. Esophagus as visualized is within normal limits. Lungs/Pleura: Patchy acute pneumonia is noted within the right lung primarily within the right lower lobe but to a lesser degree within the right upper lobe along the fissure. The left lung demonstrates some patchy opacities as well but to a lesser degree than that on the right. No effusion or pneumothorax is noted. Upper Abdomen: Visualized upper abdomen shows changes of prior cholecystectomy. No other focal abnormality is seen. Musculoskeletal:  Degenerative changes of the thoracic spine are noted. No acute bony abnormality is seen. IMPRESSION: Patchy multifocal parenchymal opacities within both lungs right greater than left consistent with multifocal pneumonia. Aortic Atherosclerosis (ICD10-I70.0). Electronically Signed   By: Inez Catalina M.D.   On: 03/01/2019 19:18    EKG:    IMPRESSION AND PLAN:   Zacharee Illes  is a 63 y.o. male with a known history of CKD stage IV, congestive heart failure, depression, diabetes with chronic kidney disease hypertension hyperlipidemia and obstructive sleep apnea uses CPAP comes to the emergency room with increasing shortness of breath more than usual with cough acute on chroni  1. Acute hypoxic respiratory failure secondary to multifocal pneumonia in the setting of COPD with history of X smoker -admit to medical floor -IV Rocephin and Zithromax -continue inhalers nebulizer -patient currently not wheezing. I'll hold of Solu-Medrol -wean oxygen to room air. Assess for home oxygen use if needed -COVID testing pending  2. Acute on  chronic kidney disease stage IV secondary diabetic nephropathy and hypertension -creatinine baseline around three -came in with creatinine of 4.1 -IV fluids -patient follows with Dr. [name] nephrology consider consultation if needed -holding torsemide for now  3. Hypertension continue home meds -continue amlodipine, beta-blocker, hydralazine  4. Hypothyroidism on Synthroid  5. Obstructive sleep apnea continue CPAP  6. DVT prophylaxis subcu heparin  No family in the ER  All the records are reviewed and case discussed with ED provider.   CODE STATUS: full  TOTAL TIME TAKING CARE OF THIS PATIENT: *50* minutes.    Fritzi Mandes M.D on 03/01/2019 at 8:51 PM  Between 7am to 6pm - Pager - [phone]  After 6pm go to [email or web address] - password [email or web address] [facility]  [facility]  Office  [phone]  CC: Primary care physician; [name], MD

## 2019-03-01 NOTE — Progress Notes (Signed)
Family Meeting Note  Advance Directive no  Today a meeting took place with the patient in the ER.  Patient has multiple medical problems with hypertension CKD stage IV morbid obesity obstructive sleep apnea diabetes comes the emergency room with increasing cough shortness of breath found to have multifocal pneumonia.  Code status address with patient he wants full resuscitation. Patient is a full code. Time spent 16 minutes Fritzi Mandes, MD

## 2019-03-01 NOTE — ED Notes (Signed)
First RN Note: Pt presents to ED via POV with c/o cough, states has been checking his O2 on a home pulse ox and his oxygen level was as low as 80% on RA. Pt ambulatory across parking lot without difficulty.

## 2019-03-01 NOTE — Progress Notes (Addendum)
ANTICOAGULATION CONSULT NOTE - Initial Consult  Pharmacy Consult for Heparin Drip Indication: chest pain/ACS  Allergies  Allergen Reactions  . Mucinex  [Guaifenesin Er]     Throat swelling, increases heart rate  . Levaquin [Levofloxacin] Palpitations    Patient Measurements: Height: 5\' 11"  (180.3 cm) Weight: 294 lb (133.4 kg) IBW/kg (Calculated) : 75.3 Heparin Dosing Weight: 105.9 kg  Vital Signs: Temp: 98.4 F (36.9 C) (10/29 1724) Temp Source: Oral (10/29 1724) BP: 167/74 (10/29 1724) Pulse Rate: 83 (10/29 1724)  Labs: Recent Labs    03/01/19 1733  HGB 8.7*  HCT 26.0*  PLT 128*  CREATININE 4.43*  TROPONINIHS 205*    Estimated Creatinine Clearance: 24.4 mL/min (A) (by C-G formula based on SCr of 4.43 mg/dL (H)).   Medical History: Past Medical History:  Diagnosis Date  . Allergy   . Anemia   . Arthritis   . Bell's palsy   . CHF (congestive heart failure) ([place])   . CKD (chronic kidney disease)   . Depression   . Diabetes ([place])   . Gastric ulcer   . GERD (gastroesophageal reflux disease)   . Gout   . History of chicken pox   . Hyperlipidemia   . Hypertension   . Hypothyroidism   . OSA (obstructive sleep apnea)   . Thyroid disease   . Vitamin D deficiency     Medications:  Scheduled:   Infusions:  . heparin 1,350 Units/hr (03/01/19 1930)    Assessment: 63 yo M to start Heaprin drip for ACS/STEMI.  Hx CHF, CKD4. No anticoagulation noted on Med Rec Hgb 8.7  Plt 128  INR 1.0  APTT 34   Scr 4.43  Goal of Therapy:  Heparin level 0.3-0.7 units/ml Monitor platelets by anticoagulation protocol: Yes   Plan:  Give 4000 units bolus x 1 Start heparin infusion at 1350 units/hr Check anti-Xa level in 8 hours and daily while on heparin Continue to monitor H&H and platelets  Patrick Hurst A 03/01/2019,7:37 PM

## 2019-03-01 NOTE — ED Triage Notes (Signed)
Patient c/o sob and cough since Monday. HX CHF, stage 4 kidney failure. Oxygen sats in triage 85% after ambulating to triage room. Patient reports oxygen worse with exertion.

## 2019-03-01 NOTE — ED Notes (Signed)
Patient transported to X-ray 

## 2019-03-02 LAB — GLUCOSE, CAPILLARY
Glucose-Capillary: 346 mg/dL — ABNORMAL HIGH (ref 70–99)
Glucose-Capillary: 285 mg/dL — ABNORMAL HIGH (ref 70–99)
Glucose-Capillary: 293 mg/dL — ABNORMAL HIGH (ref 70–99)
Glucose-Capillary: 215 mg/dL — ABNORMAL HIGH (ref 70–99)

## 2019-03-02 LAB — CBC
HCT: 27 % — ABNORMAL LOW (ref 39.0–52.0)
HCT: 28 % — ABNORMAL LOW (ref 39.0–52.0)
Hemoglobin: 8.8 g/dL — ABNORMAL LOW (ref 13.0–17.0)
Hemoglobin: 9 g/dL — ABNORMAL LOW (ref 13.0–17.0)
MCH: 29.3 pg (ref 26.0–34.0)
MCH: 29.3 pg (ref 26.0–34.0)
MCHC: 32.1 g/dL (ref 30.0–36.0)
MCHC: 32.6 g/dL (ref 30.0–36.0)
MCV: 90 fL (ref 80.0–100.0)
MCV: 91.2 fL (ref 80.0–100.0)
Platelets: 135 10*3/uL — ABNORMAL LOW (ref 150–400)
Platelets: 137 10*3/uL — ABNORMAL LOW (ref 150–400)
RBC: 3 MIL/uL — ABNORMAL LOW (ref 4.22–5.81)
RBC: 3.07 MIL/uL — ABNORMAL LOW (ref 4.22–5.81)
RDW: 15.9 % — ABNORMAL HIGH (ref 11.5–15.5)
RDW: 16 % — ABNORMAL HIGH (ref 11.5–15.5)
WBC: 7.9 10*3/uL (ref 4.0–10.5)
WBC: 8.4 10*3/uL (ref 4.0–10.5)
nRBC: 0 % (ref 0.0–0.2)
nRBC: 0 % (ref 0.0–0.2)

## 2019-03-02 LAB — HEPARIN LEVEL (UNFRACTIONATED)
Heparin Unfractionated: 0.12 IU/mL — ABNORMAL LOW (ref 0.30–0.70)
Heparin Unfractionated: <0.1 IU/mL — ABNORMAL LOW (ref 0.30–0.70)

## 2019-03-02 LAB — CREATININE, SERUM
Creatinine, Ser: 4.3 mg/dL — ABNORMAL HIGH (ref 0.61–1.24)
GFR calc Af Amer: 16 mL/min — ABNORMAL LOW (ref >60)
GFR calc non Af Amer: 14 mL/min — ABNORMAL LOW (ref >60)

## 2019-03-02 LAB — HEMOGLOBIN A1C
Hgb A1c MFr Bld: 6.1 % — ABNORMAL HIGH (ref 4.8–5.6)
Mean Plasma Glucose: 128.37 mg/dL

## 2019-03-02 LAB — SARS CORONAVIRUS 2 (TAT 6-24 HRS): SARS Coronavirus 2: NEGATIVE

## 2019-03-02 MED ORDER — SODIUM CHLORIDE 0.45 % IV SOLN
INTRAVENOUS | Status: DC
  Administered 2019-03-02: 12:00:00 via INTRAVENOUS

## 2019-03-02 MED ORDER — INSULIN ASPART 100 UNIT/ML ~~LOC~~ SOLN
0.0000 [IU] | Freq: Three times a day (TID) | SUBCUTANEOUS | Status: DC
  Administered 2019-03-02: 18:00:00 8 [IU] via SUBCUTANEOUS
  Administered 2019-03-03: 5 [IU] via SUBCUTANEOUS
  Administered 2019-03-03 – 2019-03-04 (×4): 3 [IU] via SUBCUTANEOUS
  Administered 2019-03-04 – 2019-03-05 (×2): 8 [IU] via SUBCUTANEOUS
  Administered 2019-03-05 (×2): 5 [IU] via SUBCUTANEOUS
  Administered 2019-03-06 (×2): 8 [IU] via SUBCUTANEOUS
  Administered 2019-03-06: 3 [IU] via SUBCUTANEOUS
  Administered 2019-03-07: 11 [IU] via SUBCUTANEOUS
  Administered 2019-03-07: 5 [IU] via SUBCUTANEOUS
  Administered 2019-03-07: 3 [IU] via SUBCUTANEOUS
  Administered 2019-03-08: 17:00:00 5 [IU] via SUBCUTANEOUS
  Administered 2019-03-08: 12:00:00 11 [IU] via SUBCUTANEOUS
  Administered 2019-03-08: 5 [IU] via SUBCUTANEOUS
  Administered 2019-03-09: 3 [IU] via SUBCUTANEOUS
  Administered 2019-03-09: 8 [IU] via SUBCUTANEOUS
  Filled 2019-03-02 (×20): qty 1

## 2019-03-02 MED ORDER — AZITHROMYCIN 500 MG PO TABS
500.0000 mg | ORAL_TABLET | Freq: Every day | ORAL | Status: AC
  Administered 2019-03-02 – 2019-03-05 (×4): 500 mg via ORAL
  Filled 2019-03-02 (×6): qty 1

## 2019-03-02 MED ORDER — SODIUM CHLORIDE 0.9 % IV SOLN
1.0000 g | INTRAVENOUS | Status: DC
  Administered 2019-03-03 – 2019-03-06 (×4): 1 g via INTRAVENOUS
  Filled 2019-03-02 (×3): qty 1
  Filled 2019-03-02: qty 10
  Filled 2019-03-02: qty 1

## 2019-03-02 MED ORDER — PNEUMOCOCCAL VAC POLYVALENT 25 MCG/0.5ML IJ INJ
0.5000 mL | INJECTION | INTRAMUSCULAR | Status: AC
  Administered 2019-03-09: 0.5 mL via INTRAMUSCULAR
  Filled 2019-03-02 (×3): qty 0.5

## 2019-03-02 MED ORDER — INFLUENZA VAC SPLIT QUAD 0.5 ML IM SUSY
0.5000 mL | PREFILLED_SYRINGE | INTRAMUSCULAR | Status: AC
  Administered 2019-03-03: 13:00:00 0.5 mL via INTRAMUSCULAR
  Filled 2019-03-02: qty 0.5

## 2019-03-02 MED ORDER — INSULIN REGULAR HUMAN (CONC) 500 UNIT/ML ~~LOC~~ SOPN
25.0000 [IU] | PEN_INJECTOR | Freq: Two times a day (BID) | SUBCUTANEOUS | Status: DC
  Administered 2019-03-02 – 2019-03-03 (×3): 25 [IU] via SUBCUTANEOUS
  Filled 2019-03-02: qty 3

## 2019-03-02 MED ORDER — ORAL CARE MOUTH RINSE
15.0000 mL | Freq: Two times a day (BID) | OROMUCOSAL | Status: DC
  Administered 2019-03-03 – 2019-03-09 (×11): 15 mL via OROMUCOSAL

## 2019-03-02 MED ORDER — HEPARIN BOLUS VIA INFUSION
2000.0000 [IU] | INTRAVENOUS | Status: AC
  Administered 2019-03-02: 2000 [IU] via INTRAVENOUS
  Filled 2019-03-02: qty 2000

## 2019-03-02 MED ORDER — INSULIN ASPART 100 UNIT/ML ~~LOC~~ SOLN
0.0000 [IU] | Freq: Every day | SUBCUTANEOUS | Status: DC
  Administered 2019-03-02 – 2019-03-08 (×5): 2 [IU] via SUBCUTANEOUS
  Filled 2019-03-02 (×5): qty 1

## 2019-03-02 MED ORDER — INSULIN ASPART 100 UNIT/ML ~~LOC~~ SOLN: 25.0000 [IU] | Freq: Two times a day (BID) | SUBCUTANEOUS | Status: DC

## 2019-03-02 NOTE — ED Notes (Signed)
Sats dropping to high 80s while asleep, pt bumped up to 3L at this time.

## 2019-03-02 NOTE — Progress Notes (Signed)
[facility] NAME: Nguyen Runnells    MR#:  [id number]  DATE OF BIRTH:  [birth year]  SUBJECTIVE:  CHIEF COMPLAINT:   Chief Complaint  Patient presents with  . Shortness of Breath  . Cough   Continues to have shortness of breath and cough.  Afebrile.  On 4 L oxygen.  Does not wear oxygen at home.  Was on oxygen at home in the past.  REVIEW OF SYSTEMS:    Review of Systems  Constitutional: Positive for chills and malaise/fatigue. Negative for fever.  HENT: Negative for sore throat.   Eyes: Negative for blurred vision, double vision and pain.  Respiratory: Positive for cough and shortness of breath. Negative for hemoptysis and wheezing.   Cardiovascular: Negative for chest pain, palpitations, orthopnea and leg swelling.  Gastrointestinal: Negative for abdominal pain, constipation, diarrhea, heartburn, nausea and vomiting.  Genitourinary: Negative for dysuria and hematuria.  Musculoskeletal: Negative for back pain and joint pain.  Skin: Negative for rash.  Neurological: Negative for sensory change, speech change, focal weakness and headaches.  Endo/Heme/Allergies: Does not bruise/bleed easily.  Psychiatric/Behavioral: Negative for depression. The patient is not nervous/anxious.     DRUG ALLERGIES:   Allergies  Allergen Reactions  . Guaifenesin Swelling    Throat swelling, increased heart rate.  . Mucinex  [Guaifenesin Er]     Throat swelling, increases heart rate  . Levaquin [Levofloxacin] Palpitations    VITALS:  Blood pressure (!) 159/73, pulse 72, temperature 98.4 F (36.9 C), temperature source Oral, resp. rate 16, height 5\' 11"  (1.803 m), weight 133.4 kg, SpO2 92 %.  PHYSICAL EXAMINATION:   Physical Exam  GENERAL:  63 y.o.-year-old patient lying in the bed with conversational dyspnea.  Morbidly obese EYES: Pupils equal, round, reactive to light and accommodation. No scleral icterus. Extraocular muscles intact.  HEENT:  Head atraumatic, normocephalic. Oropharynx and nasopharynx clear.  NECK:  Supple, no jugular venous distention. No thyroid enlargement, no tenderness.  LUNGS: Left lower lobe crackles. CARDIOVASCULAR: S1, S2 normal. No murmurs, rubs, or gallops.  ABDOMEN: Soft, nontender, nondistended. Bowel sounds present. No organomegaly or mass.  EXTREMITIES: No cyanosis, clubbing.  Bilateral lower extremity edema NEUROLOGIC: Cranial nerves II through XII are intact. No focal Motor or sensory deficits b/l.   PSYCHIATRIC: The patient is alert and oriented x 3.  SKIN: No obvious rash, lesion, or ulcer.   LABORATORY PANEL:   CBC Recent Labs  Lab 03/02/19 0607  WBC 7.9  HGB 9.0*  HCT 28.0*  PLT 137*   ------------------------------------------------------------------------------------------------------------------ Chemistries  Recent Labs  Lab 03/01/19 1733 03/02/19 0017  NA 136  --   K 4.1  --   CL 99  --   CO2 25  --   GLUCOSE 217*  --   BUN 78*  --   CREATININE 4.43* 4.30*  CALCIUM 8.1*  --    ------------------------------------------------------------------------------------------------------------------  Cardiac Enzymes No results for input(s): TROPONINI in the last 168 hours. ------------------------------------------------------------------------------------------------------------------  RADIOLOGY:  Dg Chest 2 View  Result Date: 03/01/2019 CLINICAL DATA:  Shortness of breath and productive cough for the past week. EXAM: CHEST - 2 VIEW COMPARISON:  CT chest dated August 03, 2018. Chest x-ray dated June 25, 2018. FINDINGS: The heart is at the upper limits of normal in size. Normal pulmonary vascularity. No focal consolidation, pleural effusion, or pneumothorax. Unchanged scarring in the right upper lobe along the minor fissure. No acute osseous abnormality. IMPRESSION: No active cardiopulmonary disease. Electronically  Signed   By: Titus Dubin M.D.   On: 03/01/2019 18:09    Ct Chest Wo Contrast  Result Date: 03/01/2019 CLINICAL DATA:  Chronic dyspnea EXAM: CT CHEST WITHOUT CONTRAST TECHNIQUE: Multidetector CT imaging of the chest was performed following the standard protocol without IV contrast. COMPARISON:  08/03/2018 FINDINGS: Cardiovascular: Somewhat limited due to lack of IV contrast. Aortic calcifications are seen without aneurysmal dilatation. No significant cardiac enlargement is noted. Coronary calcifications are seen. The pulmonary artery does not appear significantly enlarged. Mediastinum/Nodes: Thoracic inlet is within normal limits. Scattered small hilar and mediastinal lymph nodes are seen. Prominent subcarinal lymph node is noted measuring 18 mm in short axis. Esophagus as visualized is within normal limits. Lungs/Pleura: Patchy acute pneumonia is noted within the right lung primarily within the right lower lobe but to a lesser degree within the right upper lobe along the fissure. The left lung demonstrates some patchy opacities as well but to a lesser degree than that on the right. No effusion or pneumothorax is noted. Upper Abdomen: Visualized upper abdomen shows changes of prior cholecystectomy. No other focal abnormality is seen. Musculoskeletal: Degenerative changes of the thoracic spine are noted. No acute bony abnormality is seen. IMPRESSION: Patchy multifocal parenchymal opacities within both lungs right greater than left consistent with multifocal pneumonia. Aortic Atherosclerosis (ICD10-I70.0). Electronically Signed   By: Inez Catalina M.D.   On: 03/01/2019 19:18     ASSESSMENT AND PLAN:   Alexandru Sarduy  is a 63 y.o. male with a known history of CKD stage IV, congestive heart failure, depression, diabetes with chronic kidney disease hypertension hyperlipidemia and obstructive sleep apnea uses CPAP comes to the emergency room with increasing shortness of breath more than usual with cough acute on chroni  1. Acute hypoxic respiratory failure  secondary to multifocal pneumonia in the setting of COPD with history of X smoker -admit to medical floor -IV Rocephin and Zithromax -continue inhalers nebulizer -wean oxygen to room air. Assess for home oxygen use if needed -COVID testing pending  2. Acute on chronic kidney disease stage IV secondary diabetic nephropathy and hypertension -creatinine baseline around 3 -came in with creatinine of 4.1 -patient follows with Dr. [name] nephrology.  Consulted  3. Hypertension continue home meds -continue amlodipine, beta-blocker, hydralazine  4. Hypothyroidism on Synthroid  5. Obstructive sleep apnea continue CPAP  6.  Diabetes mellitus. Sliding scale insulin We will order home dose insulin  All the records are reviewed and case discussed with Care Management/Social Worker Management plans discussed with the patient, family and they are in agreement.  CODE STATUS: FULL CODE  TOTAL TIME TAKING CARE OF THIS PATIENT: 35 minutes.   POSSIBLE D/C IN 1-2 DAYS, DEPENDING ON CLINICAL CONDITION.  Leia Alf Hiawatha Merriott M.D on 03/02/2019 at 10:34 AM  Between 7am to 6pm - Pager - [phone]  After 6pm go to [email or web address] - password [email or web address] [facility]  [facility] [place] [facility]  Office  [phone]  CC: Primary care physician; [name], MD  Note: This dictation was prepared with Dragon dictation along with smaller phrase technology. Any transcriptional errors that result from this process are unintentional.

## 2019-03-02 NOTE — ED Notes (Signed)
Pt given coffee at this time 

## 2019-03-02 NOTE — ED Notes (Signed)
Breakfast tray given. Pt able to feed self.

## 2019-03-02 NOTE — Progress Notes (Signed)
Inpatient Diabetes Program Recommendations  AACE/ADA: New Consensus Statement on Inpatient Glycemic Control (2015)  Target Ranges:  Prepandial:   less than 140 mg/dL      Peak postprandial:   less than 180 mg/dL (1-2 hours)      Critically ill patients:  140 - 180 mg/dL   Lab Results  Component Value Date   GLUCAP 346 (H) 03/02/2019   HGBA1C 5.6 02/20/2018    Review of Glycemic Control  Diabetes history: DM 2 Outpatient Diabetes medications: Humulin R U-500 25 units bid Current orders for Inpatient glycemic control:  Novolog 0-9 units tid + hs Novolog 25 units bid  A1c in process  Solumedrol 125 mg given once  Inpatient Diabetes Program Recommendations:    Glucose 346 this am. Note pt on Humulin R Concentrated U-500 insulin at home and not regular Humalog.  Please consider switching to Humulin R-U500 25 units bid by reordering from patient's home med list.  Thanks,  Tama Headings RN, MSN, BC-ADM Inpatient Diabetes Coordinator Team Pager [phone] (8a-5p)

## 2019-03-02 NOTE — ED Notes (Signed)
Insulin verified by this RN.

## 2019-03-02 NOTE — Progress Notes (Signed)
[facility] for Heparin Drip Indication: chest pain/ACS  Allergies  Allergen Reactions  . Guaifenesin Swelling    Throat swelling, increased heart rate.  . Mucinex  [Guaifenesin Er]     Throat swelling, increases heart rate  . Levaquin [Levofloxacin] Palpitations   Patient Measurements: Height: 5\' 11"  (180.3 cm) Weight: 294 lb (133.4 kg) IBW/kg (Calculated) : 75.3 Heparin Dosing Weight: 105.9 kg  Vital Signs: BP: 167/82 (10/30 0600) Pulse Rate: 79 (10/30 0625)  Labs: Recent Labs    03/01/19 1733 03/01/19 1927 03/02/19 0017 03/02/19 0607  HGB 8.7*  --  8.8* 9.0*  HCT 26.0*  --  27.0* 28.0*  PLT 128*  --  135* 137*  APTT  --  34  --   --   LABPROT  --  13.3  --   --   INR  --  1.0  --   --   HEPARINUNFRC  --   --   --  0.12*  CREATININE 4.43*  --  4.30*  --   TROPONINIHS 205* 200*  --   --     Estimated Creatinine Clearance: 25.1 mL/min (A) (by C-G formula based on SCr of 4.3 mg/dL (H)).   Medical History: Past Medical History:  Diagnosis Date  . Allergy   . Anemia   . Arthritis   . Bell's palsy   . CHF (congestive heart failure) ([place])   . CKD (chronic kidney disease)   . Depression   . Diabetes ([place])   . Gastric ulcer   . GERD (gastroesophageal reflux disease)   . Gout   . History of chicken pox   . Hyperlipidemia   . Hypertension   . Hypothyroidism   . OSA (obstructive sleep apnea)   . Thyroid disease   . Vitamin D deficiency     Medications:  Scheduled:  . amitriptyline  25 mg Oral QHS  . amLODipine  10 mg Oral Daily  . atorvastatin  40 mg Oral Daily  . calcitRIOL  0.5 mcg Oral Daily  . fluticasone furoate-vilanterol  1 puff Inhalation Daily  . hydrALAZINE  25 mg Oral TID  . insulin aspart  0-5 Units Subcutaneous QHS  . insulin aspart  0-9 Units Subcutaneous TID WC  . insulin regular human CONCENTRATED  25 Units Subcutaneous BID WC  . levothyroxine  100 mcg Oral Daily  . loratadine  10 mg Oral Daily   . metoprolol tartrate  50 mg Oral BID  . pantoprazole  40 mg Oral BID  . senna  1 tablet Oral BID  . vitamin B-12  1,000 mcg Oral Daily   Infusions:  . heparin 1,350 Units/hr (03/01/19 1930)    Assessment: 63 yo M to start Heaprin drip for ACS/STEMI.  Hx CHF, CKD4. No anticoagulation noted on Med Rec Hgb 8.7  Plt 128  INR 1.0  APTT 34   Scr 4.43  10/30 @ 0607 HL 0.12, subtherapeutic  Goal of Therapy:  Heparin level 0.3-0.7 units/ml Monitor platelets by anticoagulation protocol: Yes   Plan:  Will repeat Heparin bolus w/ 2000 units x 1 then increase infusion to 1550 unit/hr Recheck heparin level 8 hours after rate increase Continue to monitor platelets, CBC  Patrick Hurst A 03/02/2019,6:39 AM

## 2019-03-02 NOTE — ED Notes (Signed)
Pt given meal tray at this time 

## 2019-03-02 NOTE — ED Notes (Signed)
Pt NAD. Denies any needs at this time

## 2019-03-03 ENCOUNTER — Inpatient Hospital Stay: Payer: Commercial Managed Care - PPO

## 2019-03-03 LAB — BLOOD GAS, ARTERIAL
Acid-base deficit: 2.8 mmol/L — ABNORMAL HIGH (ref 0.0–2.0)
Bicarbonate: 21.9 mmol/L (ref 20.0–28.0)
FIO2: 0.6
O2 Saturation: 92.3 %
Patient temperature: 37
pCO2 arterial: 37 mmHg (ref 32.0–48.0)
pH, Arterial: 7.38 (ref 7.350–7.450)
pO2, Arterial: 66 mmHg — ABNORMAL LOW (ref 83.0–108.0)

## 2019-03-03 LAB — GLUCOSE, CAPILLARY
Glucose-Capillary: 186 mg/dL — ABNORMAL HIGH (ref 70–99)
Glucose-Capillary: 181 mg/dL — ABNORMAL HIGH (ref 70–99)
Glucose-Capillary: 214 mg/dL — ABNORMAL HIGH (ref 70–99)
Glucose-Capillary: 165 mg/dL — ABNORMAL HIGH (ref 70–99)

## 2019-03-03 LAB — BASIC METABOLIC PANEL
Anion gap: 14 (ref 5–15)
BUN: 89 mg/dL — ABNORMAL HIGH (ref 8–23)
CO2: 22 mmol/L (ref 22–32)
Calcium: 8.4 mg/dL — ABNORMAL LOW (ref 8.9–10.3)
Chloride: 105 mmol/L (ref 98–111)
Creatinine, Ser: 4.4 mg/dL — ABNORMAL HIGH (ref 0.61–1.24)
GFR calc Af Amer: 16 mL/min — ABNORMAL LOW (ref >60)
GFR calc non Af Amer: 13 mL/min — ABNORMAL LOW (ref >60)
Glucose, Bld: 208 mg/dL — ABNORMAL HIGH (ref 70–99)
Potassium: 4.8 mmol/L (ref 3.5–5.1)
Sodium: 141 mmol/L (ref 135–145)

## 2019-03-03 MED ORDER — SODIUM CHLORIDE 0.9 % IV SOLN
INTRAVENOUS | Status: DC | PRN
  Administered 2019-03-03 – 2019-03-05 (×3): 250 mL via INTRAVENOUS

## 2019-03-03 MED ORDER — LORAZEPAM 2 MG/ML IJ SOLN
0.5000 mg | Freq: Once | INTRAMUSCULAR | Status: AC
  Administered 2019-03-03: 17:00:00 0.5 mg via INTRAVENOUS
  Filled 2019-03-03: qty 1

## 2019-03-03 NOTE — Progress Notes (Addendum)
Around 1615 pt c/o sob. O2 sats 82% on 2L. Gradually increased O2 to 6L, O2 sats up to 86%. Denies chest pain.  Called respiratory therapist who placed pt on HFNC, neb treatment given, O2 sats up to 90%. lung sounds diminised on ausculation. Coughing spell with pink thick sputum.  Dr Darvin Neighbours notified of the above. Orders received.   Notified Dr Darvin Neighbours that chest xray results and ABG's result in [facility]. Pt is resting, not in distress.  O2 sats 91% on HFNC.  No new orders.

## 2019-03-03 NOTE — Progress Notes (Signed)
[facility], [place] 03/03/19  Subjective:   LOS: 2 10/30 0701 - 10/31 0700 In: 530.4 [P.O.:240; I.V.:290.4] Out: -  Patient known to our practice from outpatient follow-up.  Patrick Hurst followed for chronic kidney disease stage IV This time Patrick presents for shortness of breath.  Patrick states Patrick called his pulmonologist for shortness of breath and was asked to come in to get checked for Covid which Hurst negative.  Patrick states Patrick was diagnosed with pneumonia.  Admitted for further management.  Chest x-ray shows heart Hurst at the upper limits of normal.  No focal consolidation.  CT of the chest without contrast shows patchy multifocal parenchymal opacities within both lungs right greater than left which Hurst consistent with multifocal pneumonia. Patient reports appetite Hurst fair Breathing Hurst improving Has some lower extremity edema on the left side Serum creatinine trend Hurst summarized below Lab Results  Component Value Date   CREATININE 4.40 (H) 03/03/2019   CREATININE 4.30 (H) 03/02/2019   CREATININE 4.43 (H) 03/01/2019     Objective:  Vital signs in last 24 hours:  Temp:  [97.3 F (36.3 C)-98.2 F (36.8 C)] 97.4 F (36.3 C) (10/31 0906) Pulse Rate:  [69-74] 69 (10/31 0906) Resp:  [18-20] 18 (10/31 0906) BP: (139-152)/(64-80) 139/72 (10/31 0906) SpO2:  [89 %-95 %] 94 % (10/31 0909)  Weight change:  Filed Weights   03/01/19 1725  Weight: 133.4 kg    Intake/Output:    Intake/Output Summary (Last 24 hours) at 03/03/2019 1046 Last data filed at 03/03/2019 1022 Gross per 24 hour  Intake 650.37 ml  Output -  Net 650.37 ml    Gen:   Alert, cooperative, no distress, appears stated age Head:   Normocephalic, without obvious abnormality, atraumatic Eyes/ENT:  Conjunctiva clear,  moist oral mucus membranes Neck:  Supple,  thyroid: not enlarged, no JVD Lungs:   Clear to auscultation bilaterally, respirations unlabored Heart:   Regular rate and rhythm, S1, S2  normal, no murmur, rub or gallop Abdomen:   Soft, non-tender, bowel sounds active  Extremities: no cyanosis, left leg edema present Skin:  Skin color, texture, turgor normal, no rashes or lesions Neurologic: Alert and oriented, able to answer questions appropriately     Basic Metabolic Panel:  Recent Labs  Lab 03/01/19 1733 03/02/19 0017 03/03/19 0602  NA 136  --  141  K 4.1  --  4.8  CL 99  --  105  CO2 25  --  22  GLUCOSE 217*  --  208*  BUN 78*  --  89*  CREATININE 4.43* 4.30* 4.40*  CALCIUM 8.1*  --  8.4*     CBC: Recent Labs  Lab 03/01/19 1733 03/02/19 0017 03/02/19 0607  WBC 7.6 8.4 7.9  HGB 8.7* 8.8* 9.0*  HCT 26.0* 27.0* 28.0*  MCV 89.0 90.0 91.2  PLT 128* 135* 137*      Lab Results  Component Value Date   HEPBSAG Negative 11/20/2017   HEPBSAB Non Reactive 11/20/2017   HEPBIGM Negative 11/20/2017      Microbiology:  Recent Results (from the past 240 hour(s))  SARS CORONAVIRUS 2 (TAT 6-24 HRS) Nasopharyngeal Nasopharyngeal Swab     Status: None   Collection Time: 03/01/19  7:27 PM   Specimen: Nasopharyngeal Swab  Result Value Ref Range Status   SARS Coronavirus 2 NEGATIVE NEGATIVE Final    Comment: (NOTE) SARS-CoV-2 target nucleic acids are NOT DETECTED. The SARS-CoV-2 RNA Hurst generally detectable in upper and lower  respiratory specimens during the acute phase of infection. Negative results do not preclude SARS-CoV-2 infection, do not rule out co-infections with other pathogens, and should not be used as the sole basis for treatment or other patient management decisions. Negative results must be combined with clinical observations, patient history, and epidemiological information. The expected result Hurst Negative. Fact Sheet for Patients: SugarRoll.be Fact Sheet for Healthcare Providers: https://www.woods-mathews.com/ This test Hurst not yet approved or cleared by the [place] FDA and  has been  authorized for detection and/or diagnosis of SARS-CoV-2 by FDA under an Emergency Use Authorization (EUA). This EUA will remain  in effect (meaning this test can be used) for the duration of the COVID-19 declaration under Section 56 4(b)(1) of the Act, 21 U.S.C. section 360bbb-3(b)(1), unless the authorization Hurst terminated or revoked sooner. Performed at [facility], [place] [address] [place],  [address]     Coagulation Studies: Recent Labs    03/01/19 1927  LABPROT 13.3  INR 1.0    Urinalysis: No results for input(s): COLORURINE, LABSPEC, PHURINE, GLUCOSEU, HGBUR, BILIRUBINUR, KETONESUR, PROTEINUR, UROBILINOGEN, NITRITE, LEUKOCYTESUR in the last 72 hours.  Invalid input(s): APPERANCEUR    Imaging: Dg Chest 2 View  Result Date: 03/01/2019 CLINICAL DATA:  Shortness of breath and productive cough for the past week. EXAM: CHEST - 2 VIEW COMPARISON:  CT chest dated August 03, 2018. Chest x-ray dated June 25, 2018. FINDINGS: The heart Hurst at the upper limits of normal in size. Normal pulmonary vascularity. No focal consolidation, pleural effusion, or pneumothorax. Unchanged scarring in the right upper lobe along the minor fissure. No acute osseous abnormality. IMPRESSION: No active cardiopulmonary disease. Electronically Signed   By: Titus Dubin M.D.   On: 03/01/2019 18:09   Ct Chest Wo Contrast  Result Date: 03/01/2019 CLINICAL DATA:  Chronic dyspnea EXAM: CT CHEST WITHOUT CONTRAST TECHNIQUE: Multidetector CT imaging of the chest was performed following the standard protocol without IV contrast. COMPARISON:  08/03/2018 FINDINGS: Cardiovascular: Somewhat limited due to lack of IV contrast. Aortic calcifications are seen without aneurysmal dilatation. No significant cardiac enlargement Hurst noted. Coronary calcifications are seen. The pulmonary artery does not appear significantly enlarged. Mediastinum/Nodes: Thoracic inlet Hurst within normal limits. Scattered small hilar  and mediastinal lymph nodes are seen. Prominent subcarinal lymph node Hurst noted measuring 18 mm in short axis. Esophagus as visualized Hurst within normal limits. Lungs/Pleura: Patchy acute pneumonia Hurst noted within the right lung primarily within the right lower lobe but to a lesser degree within the right upper lobe along the fissure. The left lung demonstrates some patchy opacities as well but to a lesser degree than that on the right. No effusion or pneumothorax Hurst noted. Upper Abdomen: Visualized upper abdomen shows changes of prior cholecystectomy. No other focal abnormality Hurst seen. Musculoskeletal: Degenerative changes of the thoracic spine are noted. No acute bony abnormality Hurst seen. IMPRESSION: Patchy multifocal parenchymal opacities within both lungs right greater than left consistent with multifocal pneumonia. Aortic Atherosclerosis (ICD10-I70.0). Electronically Signed   By: Inez Catalina M.D.   On: 03/01/2019 19:18     Medications:   . sodium chloride 250 mL (03/03/19 0915)  . cefTRIAXone (ROCEPHIN)  IV 1 g (03/03/19 0918)   . amitriptyline  25 mg Oral QHS  . amLODipine  10 mg Oral Daily  . atorvastatin  40 mg Oral Daily  . azithromycin  500 mg Oral Daily  . calcitRIOL  0.5 mcg Oral Daily  . fluticasone furoate-vilanterol  1 puff  Inhalation Daily  . hydrALAZINE  25 mg Oral TID  . influenza vac split quadrivalent PF  0.5 mL Intramuscular Tomorrow-1000  . insulin aspart  0-15 Units Subcutaneous TID WC  . insulin aspart  0-5 Units Subcutaneous QHS  . insulin regular human CONCENTRATED  25 Units Subcutaneous BID WC  . levothyroxine  100 mcg Oral Daily  . loratadine  10 mg Oral Daily  . mouth rinse  15 mL Mouth Rinse BID  . metoprolol tartrate  50 mg Oral BID  . pantoprazole  40 mg Oral BID  . pneumococcal 23 valent vaccine  0.5 mL Intramuscular Tomorrow-1000  . senna  1 tablet Oral BID  . vitamin B-12  1,000 mcg Oral Daily   sodium chloride, acetaminophen **OR** acetaminophen,  albuterol, ipratropium-albuterol, ondansetron **OR** ondansetron (ZOFRAN) IV, polyethylene glycol  Assessment/ Plan:  63 y.o. 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 CKD st 5.  Serum creatinine trends are summarized below Recent Labs    03/01/19 1733 03/02/19 0017 03/03/19 0602  CREATININE 4.43* 4.30* 4.40*  Patient appears to be close to his baseline and has underlying CKD may have progressed Electrolytes and volume status are acceptable.  No acute indication for dialysis at present Patient Hurst not on ACE inhibitor or ARB due to fluctuating creatinine and hyperkalemia in the past  #. Anemia of CKD  Lab Results  Component Value Date   HGB 9.0 (L) 03/02/2019  Will continue to monitor for Epogen  #. SHPTH  No results found for: PTH Lab Results  Component Value Date   PHOS 5.6 (H) 06/25/2018  Low phosphorus diet  #. HTN with CKD Blood pressure control Hurst acceptable with the current regimen  #. Diabetes type 2 with CKD Hemoglobin A1C (%)  Date Value  03/14/2012 5.0   Hgb A1c MFr Bld (%)  Date Value  03/02/2019 6.1 (H)  Insulin-dependent Management as per hospitalist team  #Shortness of breath, multifocal pneumonia -Currently getting treatment with azithromycin and ceftriaxone   LOS: [place] 10/31/202010:46 AM  [place], [place]

## 2019-03-03 NOTE — Progress Notes (Signed)
[facility] [place] NAME: Patrick Hurst    MR#:  [id number]  DATE OF BIRTH:  08/09/1955  SUBJECTIVE:  CHIEF COMPLAINT:   Chief Complaint  Patient presents with  . Shortness of Breath  . Cough   Continues to have shortness of breath and cough.   On 2 L O2  REVIEW OF SYSTEMS:    Review of Systems  Constitutional: Positive for chills and malaise/fatigue. Negative for fever.  HENT: Negative for sore throat.   Eyes: Negative for blurred vision, double vision and pain.  Respiratory: Positive for cough and shortness of breath. Negative for hemoptysis and wheezing.   Cardiovascular: Negative for chest pain, palpitations, orthopnea and leg swelling.  Gastrointestinal: Negative for abdominal pain, constipation, diarrhea, heartburn, nausea and vomiting.  Genitourinary: Negative for dysuria and hematuria.  Musculoskeletal: Negative for back pain and joint pain.  Skin: Negative for rash.  Neurological: Negative for sensory change, speech change, focal weakness and headaches.  Endo/Heme/Allergies: Does not bruise/bleed easily.  Psychiatric/Behavioral: Negative for depression. The patient is not nervous/anxious.     DRUG ALLERGIES:   Allergies  Allergen Reactions  . Guaifenesin Swelling    Throat swelling, increased heart rate.  . Mucinex  [Guaifenesin Er]     Throat swelling, increases heart rate  . Levaquin [Levofloxacin] Palpitations    VITALS:  Blood pressure 139/72, pulse 69, temperature (!) 97.4 F (36.3 C), temperature source Oral, resp. rate 18, height 5\' 11"  (1.803 m), weight 133.4 kg, SpO2 94 %.  PHYSICAL EXAMINATION:   Physical Exam  GENERAL:  63 y.o.-year-old patient lying in the bed with conversational dyspnea.  Morbidly obese EYES: Pupils equal, round, reactive to light and accommodation. No scleral icterus. Extraocular muscles intact.  HEENT: Head atraumatic, normocephalic. Oropharynx and nasopharynx clear.  NECK:   Supple, no jugular venous distention. No thyroid enlargement, no tenderness.  LUNGS: Left lower lobe crackles. CARDIOVASCULAR: S1, S2 normal. No murmurs, rubs, or gallops.  ABDOMEN: Soft, nontender, nondistended. Bowel sounds present. No organomegaly or mass.  EXTREMITIES: No cyanosis, clubbing.  Bilateral lower extremity edema NEUROLOGIC: Cranial nerves II through XII are intact. No focal Motor or sensory deficits b/l.   PSYCHIATRIC: The patient is alert and oriented x 3.  SKIN: No obvious rash, lesion, or ulcer.   LABORATORY PANEL:   CBC Recent Labs  Lab 03/02/19 0607  WBC 7.9  HGB 9.0*  HCT 28.0*  PLT 137*   ------------------------------------------------------------------------------------------------------------------ Chemistries  Recent Labs  Lab 03/03/19 0602  NA 141  K 4.8  CL 105  CO2 22  GLUCOSE 208*  BUN 89*  CREATININE 4.40*  CALCIUM 8.4*   ------------------------------------------------------------------------------------------------------------------  Cardiac Enzymes No results for input(s): TROPONINI in the last 168 hours. ------------------------------------------------------------------------------------------------------------------  RADIOLOGY:  Dg Chest 2 View  Result Date: 03/01/2019 CLINICAL DATA:  Shortness of breath and productive cough for the past week. EXAM: CHEST - 2 VIEW COMPARISON:  CT chest dated August 03, 2018. Chest x-ray dated June 25, 2018. FINDINGS: The heart is at the upper limits of normal in size. Normal pulmonary vascularity. No focal consolidation, pleural effusion, or pneumothorax. Unchanged scarring in the right upper lobe along the minor fissure. No acute osseous abnormality. IMPRESSION: No active cardiopulmonary disease. Electronically Signed   By: Titus Dubin M.D.   On: 03/01/2019 18:09   Ct Chest Wo Contrast  Result Date: 03/01/2019 CLINICAL DATA:  Chronic dyspnea EXAM: CT CHEST WITHOUT CONTRAST TECHNIQUE:  Multidetector CT imaging of the chest  was performed following the standard protocol without IV contrast. COMPARISON:  08/03/2018 FINDINGS: Cardiovascular: Somewhat limited due to lack of IV contrast. Aortic calcifications are seen without aneurysmal dilatation. No significant cardiac enlargement is noted. Coronary calcifications are seen. The pulmonary artery does not appear significantly enlarged. Mediastinum/Nodes: Thoracic inlet is within normal limits. Scattered small hilar and mediastinal lymph nodes are seen. Prominent subcarinal lymph node is noted measuring 18 mm in short axis. Esophagus as visualized is within normal limits. Lungs/Pleura: Patchy acute pneumonia is noted within the right lung primarily within the right lower lobe but to a lesser degree within the right upper lobe along the fissure. The left lung demonstrates some patchy opacities as well but to a lesser degree than that on the right. No effusion or pneumothorax is noted. Upper Abdomen: Visualized upper abdomen shows changes of prior cholecystectomy. No other focal abnormality is seen. Musculoskeletal: Degenerative changes of the thoracic spine are noted. No acute bony abnormality is seen. IMPRESSION: Patchy multifocal parenchymal opacities within both lungs right greater than left consistent with multifocal pneumonia. Aortic Atherosclerosis (ICD10-I70.0). Electronically Signed   By: Inez Catalina M.D.   On: 03/01/2019 19:18     ASSESSMENT AND PLAN:   Patrick Hurst  is a 63 y.o. male with a known history of CKD stage IV, congestive heart failure, depression, diabetes with chronic kidney disease hypertension hyperlipidemia and obstructive sleep apnea uses CPAP comes to the emergency room with increasing shortness of breath more than usual with cough acute on chroni  1. Acute hypoxic respiratory failure secondary to multifocal pneumonia in the setting of COPD with history of X smoker -IV Rocephin and Zithromax -continue inhalers  nebulizer -wean oxygen to room air. Assess for home oxygen use if needed -COVID Negative  2. 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 [name] nephrology.  Consulted  3. Hypertension continue home meds -continue amlodipine, beta-blocker, hydralazine  4. Hypothyroidism on Synthroid  5. Obstructive sleep apnea continue CPAP  6.  Diabetes mellitus. Sliding scale insulin Ordered home dose insulin  All the records are reviewed and case discussed with Care Management/Social Worker Management plans discussed with the patient, family and they are in agreement.  CODE STATUS: FULL CODE  TOTAL TIME TAKING CARE OF THIS PATIENT: 35 minutes.   POSSIBLE D/C IN 1-2 DAYS, DEPENDING ON CLINICAL CONDITION.  Leia Alf Michio Thier M.D on 03/03/2019 at 11:42 AM  Between 7am to 6pm - Pager - [phone]  After 6pm go to [email or web address] - password [email or web address] [facility]  [facility]  Office  [phone]  CC: Primary care physician; [name], MD  Note: This dictation was prepared with Dragon dictation along with smaller phrase technology. Any transcriptional errors that result from this process are unintentional.

## 2019-03-04 ENCOUNTER — Inpatient Hospital Stay: Payer: Commercial Managed Care - PPO

## 2019-03-04 DIAGNOSIS — J188 Other pneumonia, unspecified organism: Secondary | ICD-10-CM

## 2019-03-04 DIAGNOSIS — Z794 Long term (current) use of insulin: Secondary | ICD-10-CM

## 2019-03-04 DIAGNOSIS — J9601 Acute respiratory failure with hypoxia: Secondary | ICD-10-CM

## 2019-03-04 DIAGNOSIS — N185 Chronic kidney disease, stage 5: Secondary | ICD-10-CM

## 2019-03-04 DIAGNOSIS — D696 Thrombocytopenia, unspecified: Secondary | ICD-10-CM

## 2019-03-04 DIAGNOSIS — E1140 Type 2 diabetes mellitus with diabetic neuropathy, unspecified: Secondary | ICD-10-CM

## 2019-03-04 DIAGNOSIS — J449 Chronic obstructive pulmonary disease, unspecified: Secondary | ICD-10-CM

## 2019-03-04 DIAGNOSIS — J189 Pneumonia, unspecified organism: Secondary | ICD-10-CM

## 2019-03-04 HISTORY — DX: Pneumonia, unspecified organism: J18.9

## 2019-03-04 LAB — GLUCOSE, CAPILLARY
Glucose-Capillary: 153 mg/dL — ABNORMAL HIGH (ref 70–99)
Glucose-Capillary: 174 mg/dL — ABNORMAL HIGH (ref 70–99)
Glucose-Capillary: 182 mg/dL — ABNORMAL HIGH (ref 70–99)
Glucose-Capillary: 218 mg/dL — ABNORMAL HIGH (ref 70–99)
Glucose-Capillary: 251 mg/dL — ABNORMAL HIGH (ref 70–99)

## 2019-03-04 LAB — BASIC METABOLIC PANEL
Anion gap: 10 (ref 5–15)
BUN: 93 mg/dL — ABNORMAL HIGH (ref 8–23)
CO2: 23 mmol/L (ref 22–32)
Calcium: 8.3 mg/dL — ABNORMAL LOW (ref 8.9–10.3)
Chloride: 106 mmol/L (ref 98–111)
Creatinine, Ser: 4.11 mg/dL — ABNORMAL HIGH (ref 0.61–1.24)
GFR calc Af Amer: 17 mL/min — ABNORMAL LOW (ref >60)
GFR calc non Af Amer: 15 mL/min — ABNORMAL LOW (ref >60)
Glucose, Bld: 144 mg/dL — ABNORMAL HIGH (ref 70–99)
Potassium: 4.3 mmol/L (ref 3.5–5.1)
Sodium: 139 mmol/L (ref 135–145)

## 2019-03-04 LAB — CBC WITH DIFFERENTIAL/PLATELET
Abs Immature Granulocytes: 0.08 10*3/uL — ABNORMAL HIGH (ref 0.00–0.07)
Basophils Absolute: 0.1 10*3/uL (ref 0.0–0.1)
Basophils Relative: 1 %
Eosinophils Absolute: 0 10*3/uL (ref 0.0–0.5)
Eosinophils Relative: 0 %
HCT: 26.5 % — ABNORMAL LOW (ref 39.0–52.0)
Hemoglobin: 8.5 g/dL — ABNORMAL LOW (ref 13.0–17.0)
Immature Granulocytes: 1 %
Lymphocytes Relative: 6 %
Lymphs Abs: 0.6 10*3/uL — ABNORMAL LOW (ref 0.7–4.0)
MCH: 29.4 pg (ref 26.0–34.0)
MCHC: 32.1 g/dL (ref 30.0–36.0)
MCV: 91.7 fL (ref 80.0–100.0)
Monocytes Absolute: 0.7 10*3/uL (ref 0.1–1.0)
Monocytes Relative: 8 %
Neutro Abs: 7.9 10*3/uL — ABNORMAL HIGH (ref 1.7–7.7)
Neutrophils Relative %: 84 %
Platelets: 123 10*3/uL — ABNORMAL LOW (ref 150–400)
RBC: 2.89 MIL/uL — ABNORMAL LOW (ref 4.22–5.81)
RDW: 15.9 % — ABNORMAL HIGH (ref 11.5–15.5)
WBC: 9.3 10*3/uL (ref 4.0–10.5)
nRBC: 0 % (ref 0.0–0.2)

## 2019-03-04 LAB — HEPARIN LEVEL (UNFRACTIONATED): Heparin Unfractionated: <0.1 IU/mL — ABNORMAL LOW (ref 0.30–0.70)

## 2019-03-04 LAB — BRAIN NATRIURETIC PEPTIDE: B Natriuretic Peptide: 315 pg/mL — ABNORMAL HIGH (ref 0.0–100.0)

## 2019-03-04 LAB — TROPONIN I (HIGH SENSITIVITY)
Troponin I (High Sensitivity): 232 ng/L (ref <18)
Troponin I (High Sensitivity): 469 ng/L (ref <18)

## 2019-03-04 LAB — MRSA PCR SCREENING: MRSA by PCR: NEGATIVE

## 2019-03-04 LAB — MAGNESIUM: Magnesium: 2 mg/dL (ref 1.7–2.4)

## 2019-03-04 LAB — APTT: aPTT: 32 seconds (ref 24–36)

## 2019-03-04 LAB — TSH: TSH: 2.518 u[IU]/mL (ref 0.350–4.500)

## 2019-03-04 MED ORDER — DILTIAZEM LOAD VIA INFUSION
10.0000 mg | Freq: Once | INTRAVENOUS | Status: AC
  Administered 2019-03-04: 12:00:00 10 mg via INTRAVENOUS
  Filled 2019-03-04: qty 10

## 2019-03-04 MED ORDER — LORAZEPAM 0.5 MG PO TABS
0.5000 mg | ORAL_TABLET | Freq: Four times a day (QID) | ORAL | Status: DC | PRN
  Administered 2019-03-04 – 2019-03-05 (×2): 0.5 mg via ORAL
  Filled 2019-03-04 (×2): qty 1

## 2019-03-04 MED ORDER — FUROSEMIDE 10 MG/ML IJ SOLN
80.0000 mg | Freq: Once | INTRAMUSCULAR | Status: AC
  Administered 2019-03-04: 20:00:00 80 mg via INTRAVENOUS
  Filled 2019-03-04: qty 8

## 2019-03-04 MED ORDER — DILTIAZEM HCL 100 MG IV SOLR
5.0000 mg/h | INTRAVENOUS | Status: DC
  Administered 2019-03-04 – 2019-03-05 (×2): 15 mg/h via INTRAVENOUS
  Filled 2019-03-04 (×2): qty 100

## 2019-03-04 MED ORDER — INSULIN REGULAR HUMAN (CONC) 500 UNIT/ML ~~LOC~~ SOPN
15.0000 [IU] | PEN_INJECTOR | Freq: Two times a day (BID) | SUBCUTANEOUS | Status: DC
  Administered 2019-03-04 – 2019-03-05 (×2): 15 [IU] via SUBCUTANEOUS
  Filled 2019-03-04: qty 3

## 2019-03-04 MED ORDER — DILTIAZEM LOAD VIA INFUSION
20.0000 mg | Freq: Once | INTRAVENOUS | Status: AC
  Administered 2019-03-04: 10:00:00 20 mg via INTRAVENOUS
  Filled 2019-03-04: qty 20

## 2019-03-04 MED ORDER — DILTIAZEM HCL-DEXTROSE 125-5 MG/125ML-% IV SOLN (PREMIX)
5.0000 mg/h | INTRAVENOUS | Status: DC
  Filled 2019-03-04: qty 125

## 2019-03-04 MED ORDER — IPRATROPIUM-ALBUTEROL 0.5-2.5 (3) MG/3ML IN SOLN
3.0000 mL | Freq: Four times a day (QID) | RESPIRATORY_TRACT | Status: DC
  Administered 2019-03-04 – 2019-03-06 (×8): 3 mL via RESPIRATORY_TRACT
  Filled 2019-03-04 (×8): qty 3

## 2019-03-04 MED ORDER — AMIODARONE HCL IN DEXTROSE 360-4.14 MG/200ML-% IV SOLN
60.0000 mg/h | INTRAVENOUS | Status: AC
  Administered 2019-03-04 – 2019-03-05 (×2): 60 mg/h via INTRAVENOUS
  Filled 2019-03-04: qty 200

## 2019-03-04 MED ORDER — AMIODARONE LOAD VIA INFUSION
150.0000 mg | Freq: Once | INTRAVENOUS | Status: AC
  Administered 2019-03-04: 22:00:00 150 mg via INTRAVENOUS
  Filled 2019-03-04: qty 83.34

## 2019-03-04 MED ORDER — AMIODARONE IV BOLUS ONLY 150 MG/100ML
150.0000 mg | Freq: Once | INTRAVENOUS | Status: AC
  Administered 2019-03-04: 150 mg via INTRAVENOUS
  Filled 2019-03-04: qty 100

## 2019-03-04 MED ORDER — INSULIN REGULAR HUMAN (CONC) 500 UNIT/ML ~~LOC~~ SOPN
15.0000 [IU] | PEN_INJECTOR | Freq: Two times a day (BID) | SUBCUTANEOUS | Status: DC
  Filled 2019-03-04: qty 3

## 2019-03-04 MED ORDER — DILTIAZEM HCL-DEXTROSE 125-5 MG/125ML-% IV SOLN (PREMIX)
5.0000 mg/h | INTRAVENOUS | Status: DC
  Administered 2019-03-04 (×2): 15 mg/h via INTRAVENOUS
  Filled 2019-03-04 (×3): qty 125

## 2019-03-04 MED ORDER — HEPARIN BOLUS VIA INFUSION
6000.0000 [IU] | Freq: Once | INTRAVENOUS | Status: AC
  Administered 2019-03-04: 12:00:00 6000 [IU] via INTRAVENOUS
  Filled 2019-03-04: qty 6000

## 2019-03-04 MED ORDER — HEPARIN BOLUS VIA INFUSION
4000.0000 [IU] | Freq: Once | INTRAVENOUS | Status: AC
  Administered 2019-03-04: 22:00:00 4000 [IU] via INTRAVENOUS
  Filled 2019-03-04: qty 4000

## 2019-03-04 MED ORDER — AMIODARONE HCL IN DEXTROSE 360-4.14 MG/200ML-% IV SOLN
30.0000 mg/h | INTRAVENOUS | Status: DC
  Administered 2019-03-05 – 2019-03-07 (×5): 30 mg/h via INTRAVENOUS
  Filled 2019-03-04 (×6): qty 200

## 2019-03-04 MED ORDER — DILTIAZEM HCL 100 MG IV SOLR
INTRAVENOUS | Status: AC
  Filled 2019-03-04: qty 100

## 2019-03-04 MED ORDER — CHLORHEXIDINE GLUCONATE CLOTH 2 % EX PADS
6.0000 | MEDICATED_PAD | Freq: Every day | CUTANEOUS | Status: DC
  Administered 2019-03-04 – 2019-03-07 (×4): 6 via TOPICAL

## 2019-03-04 MED ORDER — HEPARIN (PORCINE) 25000 UT/250ML-% IV SOLN
2150.0000 [IU]/h | INTRAVENOUS | Status: DC
  Administered 2019-03-04: 12:00:00 1600 [IU]/h via INTRAVENOUS
  Administered 2019-03-04: 22:00:00 1850 [IU]/h via INTRAVENOUS
  Filled 2019-03-04 (×3): qty 250

## 2019-03-04 MED ORDER — CHLORHEXIDINE GLUCONATE 0.12 % MT SOLN
OROMUCOSAL | Status: AC
  Administered 2019-03-04: 22:00:00 15 mL
  Filled 2019-03-04: qty 15

## 2019-03-04 NOTE — Progress Notes (Signed)
[facility], [place] 03/04/19  Subjective:   LOS: 3 10/31 0701 - 11/01 0700 In: 226.6 [P.O.:120; I.V.:6.6; IV Piggyback:100] Out: 2275 [Urine:2275] Patient known to our practice from outpatient follow-up.  He is followed for chronic kidney disease stage IV This time he presents for shortness of breath.  He states he called his pulmonologist for shortness of breath and was asked to come in to get checked for Covid which is negative.  He states he was diagnosed with pneumonia.  Admitted for further management.  Chest x-ray shows heart is at the upper limits of normal.  No focal consolidation.  CT of the chest without contrast shows patchy multifocal parenchymal opacities within both lungs right greater than left which is consistent with multifocal pneumonia.  Lab Results  Component Value Date   CREATININE 4.11 (H) 03/04/2019   CREATININE 4.40 (H) 03/03/2019   CREATININE 4.30 (H) 03/02/2019   Today, patient had atrial fibrillation with rapid ventricular response.  He was transferred to ICU for close monitoring.  Otherwise he feels fair.  Denies any acute complaints.  Objective:  Vital signs in last 24 hours:  Temp:  [98.1 F (36.7 C)-98.7 F (37.1 C)] 98.1 F (36.7 C) (11/01 0947) Pulse Rate:  [64-161] 64 (11/01 0947) Resp:  [18-22] 22 (11/01 0947) BP: (124-163)/(78-100) 135/95 (11/01 0947) SpO2:  [83 %-98 %] 91 % (11/01 0947) Weight:  [135.3 kg] 135.3 kg (11/01 0947)  Weight change:  Filed Weights   03/01/19 1725 03/04/19 0947  Weight: 133.4 kg 135.3 kg    Intake/Output:    Intake/Output Summary (Last 24 hours) at 03/04/2019 1155 Last data filed at 03/04/2019 0321 Gross per 24 hour  Intake 106.6 ml  Output 2275 ml  Net -2168.4 ml    Gen:   Alert, cooperative, no distress, appears stated age, obese Head:   Normocephalic, without obvious abnormality, atraumatic Eyes/ENT:  Conjunctiva clear,  moist oral mucus membranes Neck:  Supple,  thyroid:  not enlarged, no JVD Lungs:   Coarse bilaterally, respirations unlabored, [place] O2 Heart:   Irregular tachcardic, S1, S2 normal,  Abdomen:   Soft, non-tender, bowel sounds active  Extremities: no cyanosis, left leg edema present Skin:  Skin warm, turgor normal, no rashes or lesions Neurologic: Alert and oriented, able to answer questions appropriately     Basic Metabolic Panel:  Recent Labs  Lab 03/01/19 1733 03/02/19 0017 03/03/19 0602 03/04/19 0548  NA 136  --  141 139  K 4.1  --  4.8 4.3  CL 99  --  105 106  CO2 25  --  22 23  GLUCOSE 217*  --  208* 144*  BUN 78*  --  89* 93*  CREATININE 4.43* 4.30* 4.40* 4.11*  CALCIUM 8.1*  --  8.4* 8.3*  MG  --   --   --  2.0     CBC: Recent Labs  Lab 03/01/19 1733 03/02/19 0017 03/02/19 0607 03/04/19 0548  WBC 7.6 8.4 7.9 9.3  NEUTROABS  --   --   --  7.9*  HGB 8.7* 8.8* 9.0* 8.5*  HCT 26.0* 27.0* 28.0* 26.5*  MCV 89.0 90.0 91.2 91.7  PLT 128* 135* 137* 123*      Lab Results  Component Value Date   HEPBSAG Negative 11/20/2017   HEPBSAB Non Reactive 11/20/2017   HEPBIGM Negative 11/20/2017      Microbiology:  Recent Results (from the past 240 hour(s))  SARS CORONAVIRUS 2 (TAT 6-24 HRS) Nasopharyngeal Nasopharyngeal Swab  Status: None   Collection Time: 03/01/19  7:27 PM   Specimen: Nasopharyngeal Swab  Result Value Ref Range Status   SARS Coronavirus 2 NEGATIVE NEGATIVE Final    Comment: (NOTE) SARS-CoV-2 target nucleic acids are NOT DETECTED. The SARS-CoV-2 RNA is generally detectable in upper and lower respiratory specimens during the acute phase of infection. Negative results do not preclude SARS-CoV-2 infection, do not rule out co-infections with other pathogens, and should not be used as the sole basis for treatment or other patient management decisions. Negative results must be combined with clinical observations, patient history, and epidemiological information. The expected result is  Negative. Fact Sheet for Patients: SugarRoll.be Fact Sheet for Healthcare Providers: https://www.woods-mathews.com/ This test is not yet approved or cleared by the [place] FDA and  has been authorized for detection and/or diagnosis of SARS-CoV-2 by FDA under an Emergency Use Authorization (EUA). This EUA will remain  in effect (meaning this test can be used) for the duration of the COVID-19 declaration under Section 56 4(b)(1) of the Act, 21 U.S.C. section 360bbb-3(b)(1), unless the authorization is terminated or revoked sooner. Performed at [facility], [place] [address], [place], [place] [address]   MRSA PCR Screening     Status: None   Collection Time: 03/04/19  9:50 AM   Specimen: Nasopharyngeal  Result Value Ref Range Status   MRSA by PCR NEGATIVE NEGATIVE Final    Comment:        The GeneXpert MRSA Assay (FDA approved for NASAL specimens only), is one component of a comprehensive MRSA colonization surveillance program. It is not intended to diagnose MRSA infection nor to guide or monitor treatment for MRSA infections. Performed at [facility], [address] [place], [place] [address]     Coagulation Studies: Recent Labs    03/01/19 1927  LABPROT 13.3  INR 1.0    Urinalysis: No results for input(s): COLORURINE, LABSPEC, PHURINE, GLUCOSEU, HGBUR, BILIRUBINUR, KETONESUR, PROTEINUR, UROBILINOGEN, NITRITE, LEUKOCYTESUR in the last 72 hours.  Invalid input(s): APPERANCEUR    Imaging: Dg Chest 2 View  Result Date: 03/03/2019 CLINICAL DATA:  Shortness of breath. Pneumonia. Congestive heart failure and chronic kidney disease. EXAM: CHEST - 2 VIEW COMPARISON:  03/01/2019 FINDINGS: The heart size and mediastinal contours are within normal limits. New asymmetric airspace disease is seen in the right mid and lower lung, suspicious for pneumonia. Left lung remains clear. No evidence of pleural effusion.  IMPRESSION: New asymmetric airspace disease in right mid and lower lung, suspicious for pneumonia. Electronically Signed   By: Marlaine Hind M.D.   On: 03/03/2019 17:32     Medications:   . sodium chloride 250 mL (03/04/19 0659)  . cefTRIAXone (ROCEPHIN)  IV 1 g (03/04/19 0659)  . diltiazem (CARDIZEM) infusion 15 mg/hr (03/04/19 1009)  . heparin     . amitriptyline  25 mg Oral QHS  . atorvastatin  40 mg Oral Daily  . azithromycin  500 mg Oral Daily  . calcitRIOL  0.5 mcg Oral Daily  . Chlorhexidine Gluconate Cloth  6 each Topical Daily  . diltiazem  10 mg Intravenous Once  . fluticasone furoate-vilanterol  1 puff Inhalation Daily  . heparin  6,000 Units Intravenous Once  . hydrALAZINE  25 mg Oral TID  . insulin aspart  0-15 Units Subcutaneous TID WC  . insulin aspart  0-5 Units Subcutaneous QHS  . insulin regular human CONCENTRATED  25 Units Subcutaneous BID WC  . levothyroxine  100 mcg Oral Daily  .  loratadine  10 mg Oral Daily  . mouth rinse  15 mL Mouth Rinse BID  . pantoprazole  40 mg Oral BID  . pneumococcal 23 valent vaccine  0.5 mL Intramuscular Tomorrow-1000  . senna  1 tablet Oral BID  . vitamin B-12  1,000 mcg Oral Daily   sodium chloride, acetaminophen **OR** acetaminophen, albuterol, ipratropium-albuterol, LORazepam, ondansetron **OR** ondansetron (ZOFRAN) IV, polyethylene glycol  Assessment/ Plan:  63 y.o.caucasian male with chronic kidney disease stage IV, obstructive sleep apnea, chronic edema, cirrhosis of the liver, proteinuria, secondary hyperparathyroidism, morbid obesity and diabetes with complications of neuropathy and nephropathy  Principal Problem:   Acute hypoxemic respiratory failure (HCC) Active Problems:   Diabetes mellitus with neuropathy (HCC)   Hypothyroidism   Thrombocytopenia (HCC)   Obesity, Class III, BMI 40-49.9 (morbid obesity) (HCC)   COPD (chronic obstructive pulmonary disease) (HCC)   Multifocal pneumonia   CKD (chronic kidney  disease), stage V ([place])   #. CKD st 5.  Serum creatinine trends are summarized below Recent Labs    03/01/19 1733 03/02/19 0017 03/03/19 0602 03/04/19 0548  CREATININE 4.43* 4.30* 4.40* 4.11*  Patient appears to be close to his baseline as underlying CKD may have progressed Electrolytes and volume status are acceptable.  No acute indication for dialysis at present Patient is not on ACE inhibitor or ARB due to fluctuating creatinine and hyperkalemia in the past  #. Anemia of CKD  Lab Results  Component Value Date   HGB 8.5 (L) 03/04/2019  Will continue to monitor for Epogen  #. SHPTH  No results found for: PTH Lab Results  Component Value Date   PHOS 5.6 (H) 06/25/2018  Low phosphorus diet  #. HTN with CKD Blood pressure control is acceptable with the current regimen  #. Diabetes type 2 with CKD Hemoglobin A1C (%)  Date Value  03/14/2012 5.0   Hgb A1c MFr Bld (%)  Date Value  03/02/2019 6.1 (H)  Insulin-dependent Management as per hospitalist team  #Shortness of breath, multifocal pneumonia -Currently getting treatment with azithromycin and ceftriaxone   #Atrial fibrillation with rapid ventricular response -Transferred to ICU for IV Cardizem   LOS: [place] 11/1/202011:55 [place], [place]

## 2019-03-04 NOTE — Consult Note (Addendum)
PULMONARY / CRITICAL CARE MEDICINE  Name: Patrick Hurst MRN: [id number] DOB: November 30, 1955    LOS: 3  Referring Provider:  Dr [name] Reason for Referral: Acute hypoxic respiratory failure  Brief patient description: 63 Y/O male with OSA/OHS  Admitted with acute hypoxic respiratory failure, acute on chronic renal failure, and acute pulmonary edema   HPI: This is a 63 year old male with a medical history as indicated below who presented to the ED with worsening dyspnea and cough.  His chest x-ray showed diffuse infiltrates that was assumed to be multifocal pneumonia.  He was also found to be in acute on chronic renal failure stage IV with a creatinine level greater than 4 up from his baseline of 3.4.  He was started on Rocephin and Zithromax and placed on supplemental oxygen and admitted to the floor.  Today he developed A. fib with RVR with heart rate in the 150s and increasing oxygen needs and hence was transferred to the ICU for IV diltiazem, IV amiodarone and continuous BiPAP.  Repeat chest x-ray today also showed diffuse pulmonary edema. He is currently on high flow nasal cannula and his SPO2 stable in the high 90s.  His heart rate continues to fluctuate between 110 and 140 bpm on diltiazem and amiodarone infusions.  He reports improved dyspnea.  Past Medical History:  Diagnosis Date  . Allergy   . Anemia   . Arthritis   . Bell's palsy   . CHF (congestive heart failure) ([place])   . CKD (chronic kidney disease)   . Depression   . Diabetes ([place])   . Gastric ulcer   . GERD (gastroesophageal reflux disease)   . Gout   . History of chicken pox   . Hyperlipidemia   . Hypertension   . Hypothyroidism   . OSA (obstructive sleep apnea)   . Thyroid disease   . Vitamin D deficiency    Past Surgical History:  Procedure Laterality Date  . CATARACT EXTRACTION Bilateral 2014 and 2015  . CHOLECYSTECTOMY  04/28/2011   Laproscopic; Dr. Pat Patrick  . GALLBLADDER SURGERY    . LASIK Bilateral  2019   medical  . SHOULDER SURGERY Right 2012   Dr. Leanor Kail   No current facility-administered medications on file prior to encounter.    Current Outpatient Medications on File Prior to Encounter  Medication Sig  . albuterol (VENTOLIN HFA) 108 (90 Base) MCG/ACT inhaler USE 2 INHALATIONS EVERY 4 HOURS AS NEEDED FOR WHEEZING OR SHORTNESS OF BREATH  . amitriptyline (ELAVIL) 25 MG tablet Take 1 tablet (25 mg total) by mouth at bedtime.  Marland Kitchen amLODipine (NORVASC) 10 MG tablet TAKE 1 TABLET DAILY  . Ascorbic Acid (VITAMIN C) 1000 MG tablet Take 1,000 mg by mouth 2 (two) times daily.   Marland Kitchen aspirin 81 MG tablet Take 81 mg by mouth daily.  Marland Kitchen atorvastatin (LIPITOR) 40 MG tablet Take 1 tablet (40 mg total) by mouth daily.  . calcitRIOL (ROCALTROL) 0.5 MCG capsule Take 0.5 mcg by mouth daily.  . cetirizine (ZYRTEC) 10 MG tablet Take 10 mg by mouth daily.   . fluticasone furoate-vilanterol (BREO ELLIPTA) 100-25 MCG/INH AEPB Inhale 1 puff into the lungs daily.  . hydrALAZINE (APRESOLINE) 25 MG tablet TAKE 1 TABLET THREE TIMES A DAY  . insulin regular human CONCENTRATED (HUMULIN R) 500 UNIT/ML injection Inject up to 25 units twice a day, or as directed by physician  . ipratropium-albuterol (DUONEB) 0.5-2.5 (3) MG/3ML SOLN INHALE 3 ML BY NEBULIZATION EVERY 4 HOURS AS  NEEDED  . levothyroxine (SYNTHROID) 100 MCG tablet Take 1 tablet (100 mcg total) by mouth daily.  . metoprolol tartrate (LOPRESSOR) 50 MG tablet TAKE 1 TABLET TWICE A DAY  . pantoprazole (PROTONIX) 40 MG tablet TAKE 1 TABLET TWICE A DAY  . torsemide (DEMADEX) 20 MG tablet Take 2 tablets (40 mg total) by mouth daily.  Marland Kitchen ULORIC 80 MG TABS Take 80 mg by mouth daily.   . vitamin B-12 (CYANOCOBALAMIN) 1000 MCG tablet Take 1,000 mcg by mouth daily.  . Misc. Devices (PULSE OXIMETER FOR FINGER) MISC 1 Device by Does not apply route daily as needed.  . tadalafil (CIALIS) 10 MG tablet TAKE ONE TABLET BY MOUTH DAILY AS NEEDED (Patient not taking:  Reported on 03/02/2019)  . testosterone cypionate (DEPO-TESTOSTERONE) 200 MG/ML injection Inject 1 mL (200 mg total) into the muscle every 14 (fourteen) days.    Allergies Allergies  Allergen Reactions  . Guaifenesin Swelling    Throat swelling, increased heart rate.  . Mucinex  [Guaifenesin Er]     Throat swelling, increases heart rate  . Levaquin [Levofloxacin] Palpitations    Family History Family History  Problem Relation Age of Onset  . COPD Mother   . Heart disease Father    Social History  reports that he quit smoking about 40 years ago. His smoking use included cigarettes. He has a 15.00 pack-year smoking history. He has quit using smokeless tobacco. He reports that he does not drink alcohol or use drugs.  Review Of Systems:   Constitutional: Negative for fever and chills but positive for malaise.  HENT: Negative for congestion and rhinorrhea.  Eyes: Negative for redness and visual disturbance.  Respiratory: Positive for shortness of breath and cough but negative for wheezing Cardiovascular: Negative for chest pain and palpitations but positive for orthopnea.  Gastrointestinal: Negative  for nausea , vomiting and abdominal pain and  Loose stools Genitourinary: Negative for dysuria and urgency.  Endocrine: Denies polyuria, polyphagia and heat intolerance Musculoskeletal: Negative for myalgias and arthralgias.  Skin: Negative for pallor and wound.  Neurological: Negative for dizziness and headaches   VITAL SIGNS: BP 133/85   Pulse 66   Temp 99 F (37.2 C) (Oral)   Resp 19   Ht 5\' 11"  (1.803 m)   Wt 135.3 kg   SpO2 95%   BMI 41.60 kg/m   HEMODYNAMICS:    VENTILATOR SETTINGS: FiO2 (%):  [90 %] 90 %  INTAKE / OUTPUT: I/O last 3 completed shifts: In: 226.6 [P.O.:120; I.V.:6.6; IV Piggyback:100] Out: 3475 [Urine:3475]  PHYSICAL EXAMINATION: General: Well-groomed, well-nourished, in mild respiratory distress HEENT: PERRLA, trachea midline, no JVD Neuro:  Alert and oriented x4, no focal deficit Cardiovascular: Apical pulse irregular, tachycardic at 122 bpm, S1-S2, no murmur regurg or gallop, +2 pulses bilaterally, +2 edema bilateral Lungs: Bilateral breath sounds, diminished in the bases with bibasilar crackles Abdomen: Obese, normal bowel sounds in all 4 quadrants, palpation reveals no organomegaly Musculoskeletal: Positive range of motion, no joint deformities Skin: Warm and dry  LABS:  BMET Recent Labs  Lab 03/01/19 1733 03/02/19 0017 03/03/19 0602 03/04/19 0548  NA 136  --  141 139  K 4.1  --  4.8 4.3  CL 99  --  105 106  CO2 25  --  22 23  BUN 78*  --  89* 93*  CREATININE 4.43* 4.30* 4.40* 4.11*  GLUCOSE 217*  --  208* 144*    Electrolytes Recent Labs  Lab 03/01/19 1733 03/03/19 0602 03/04/19  [id number]  CALCIUM 8.1* 8.4* 8.3*  MG  --   --  2.0    CBC Recent Labs  Lab 03/02/19 0017 03/02/19 0607 03/04/19 0548  WBC 8.4 7.9 9.3  HGB 8.8* 9.0* 8.5*  HCT 27.0* 28.0* 26.5*  PLT 135* 137* 123*    Coag's Recent Labs  Lab 03/01/19 1927 03/04/19 1200  APTT 34 32  INR 1.0  --     Sepsis Markers Recent Labs  Lab 03/01/19 1733  PROCALCITON 0.26    ABG Recent Labs  Lab 03/03/19 1643  PHART 7.38  PCO2ART 37  PO2ART 66*    Liver Enzymes No results for input(s): AST, ALT, ALKPHOS, BILITOT, ALBUMIN in the last 168 hours.  Cardiac Enzymes No results for input(s): TROPONINI, PROBNP in the last 168 hours.  Glucose Recent Labs  Lab 03/03/19 2039 03/04/19 0743 03/04/19 0946 03/04/19 1126 03/04/19 1615 03/04/19 2147  GLUCAP 165* 153* 174* 182* 218* 251*    Imaging Dg Chest Port 1 View  Result Date: 03/04/2019 CLINICAL DATA:  Respiratory distress EXAM: PORTABLE CHEST 1 VIEW COMPARISON:  March 03, 2019 FINDINGS: Increasing interstitial opacities throughout the left lung. More focal opacities are developing in the right base and right perihilar region. Cardiomegaly. The hila and mediastinum are  unchanged. No pneumothorax. No other changes. IMPRESSION: 1. Developing focal infiltrate in the right perihilar region and right base worrisome for an infectious process/pneumonia. Mild increased interstitial markings in the left may represent pulmonary venous congestion. Electronically Signed   By: Dorise Bullion III M.D   On: 03/04/2019 19:06   STUDIES:  2D echo pending  CULTURES: COVID-19 negative MRSA screen negative  ANTIBIOTICS: Azithromycin 03/01/2019> Ceftriaxone 03/01/2019>  SIGNIFICANT EVENTS: 03/01/2019: Admitted 03/04/2019: Transferred to the ICU for worsening oxygen needs an elevated heart rate  LINES/TUBES: Peripheral IVs  DISCUSSION: 63 year old male with a history of obstructive sleep apnea, obesity hypoventilation syndrome, on home CPAP, CKD stage IV and CHF now presenting with new onset A. fib with RVR, acute CHF exacerbation, acute pulmonary edema and possible pneumonia  ASSESSMENT / PLAN:  PULMONARY A: Acute hypoxic respiratory failure Community-acquired pneumonia Acute pulmonary edema Obstructive sleep apnea/obesity hypoventilation syndrome P:   Patient is noncompliant with BiPAP.  Recommend family brings in his home CPAP and he is to be a mandatory nocturnal CPAP IV diuresis Chest x-ray and ABG as needed Inhaled steroids and bronchodilators Influenza vaccine if patient agrees  CARDIOVASCULAR A:  New onset A. fib with RVR Acute CHF exacerbation P:  IV diuresis 2D echo Cardiology following Per cardiology, titrate off diltiazem infusion since his heart rate remains elevated and start metoprolol 5 mg IV every 5 minutes x3 doses until heart rate is less than 100 bpm.  Also start metoprolol 25 mg p.o. twice daily.  RENAL A:   Acute on chronic renal failure stage V P:   Renal indicis continue to worsen hence patient may require dialysis Nephrology following Continue to monitor renal indices and avoid nephrotoxic drugs Monitor and correct  electrolytes   HEMATOLOGIC A:   Chronic thrombocytopenia-platelet count down to 115 but history levels usually fluctuate between 110 and 135. P:  No acute bleeding.  Continue to monitor and transfuse as needed  INFECTIOUS A:   Community-acquired pneumonia P:   Antibiotics as above Fever has resolved; no cultures pending  ENDOCRINE A:   Type 2 diabetes with hyperglycemia Hypothyroidism P:   Resume Humulin U5 100 at 15 units twice daily and hold for meal consumption less than  50% Continue blood glucose monitoring every 4 hours Continue current dose of Synthroid   Best Practice: Code Status: Full code Diet: Heart healthy/carb modified diet GI prophylaxis: Protonix 40 mg twice a day VTE prophylaxis: Already on full-strength heparin infusion  FAMILY  - Updates: Patient has decision-making capacity.  Already updated on current treatment plan  Magdalene S. Tukov-Yual ANP-BC Pulmonary and [place] Pager [phone] or [phone]  NB: This document was prepared using Dragon voice recognition software and may include unintentional dictation errors.    03/04/2019, 11:32 PM

## 2019-03-04 NOTE — Progress Notes (Signed)
Central tele reported pt had 6 beat run of V-Tach about 0140. Pt had some bigeminy earlier in shift. MD is aware. No new orders. Pt has been having trouble keeping his BiPAP on during shift. Pt is currently back on HFNC. No other signs of distress noted. Will continue to monitor.

## 2019-03-04 NOTE — TOC Initial Note (Signed)
Transition of Care [facility]) - Initial/Assessment Note    Patient Details  Name: Patrick Hurst MRN: [id number] Date of Birth: 1955/06/23  Transition of [facility]) CM/SW Contact:    Katrina Stack, RN Phone Number: 03/04/2019, 3:32 PM  Clinical Narrative:      Consult placed 10/29  for "long term acute care." When CM attempted to perform assessment, patient in the process of being transferred to the icu.  Developed new onset atrial fib with rapid ventricular response. Unable to assess     Patient Goals and CMS Choice        Expected Discharge Plan and Services                                                Prior Living Arrangements/Services                       Activities of Daily Living Home Assistive Devices/Equipment: None ADL Screening (condition at time of admission) Patient's cognitive ability adequate to safely complete daily activities?: Yes Is the patient deaf or have difficulty hearing?: No Does the patient have difficulty seeing, even when wearing glasses/contacts?: No Does the patient have difficulty concentrating, remembering, or making decisions?: No Patient able to express need for assistance with ADLs?: Yes Does the patient have difficulty dressing or bathing?: No Independently performs ADLs?: Yes (appropriate for developmental age) Does the patient have difficulty walking or climbing stairs?: Yes(Shortness of breath when climbing stairs) Weakness of Legs: Both(Weakness and numbness of feet) Weakness of Arms/Hands: None  Permission Sought/Granted                  Emotional Assessment              Admission diagnosis:  CKD (chronic kidney disease) stage 5, GFR less than 15 ml/min (HCC) [N18.5] Non-STEMI (non-ST elevated myocardial infarction) ([place]) [I21.4] Acute respiratory failure with hypoxia ([place]) [J96.01] Multifocal pneumonia [J18.9] Acute hypoxemic respiratory failure ([place]) [J96.01] Patient Active Problem List   Diagnosis Date Noted  . Multifocal pneumonia 03/04/2019  . CKD (chronic kidney disease), stage V ([place]) 03/04/2019  . Acute hypoxemic respiratory failure ([place]) 03/01/2019  . COPD (chronic obstructive pulmonary disease) ([place]) 12/08/2018  . 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 Chronic diastolic (congestive) heart failure ([place]) 06/28/2018  . CKD (chronic kidney disease), stage IV ([place]) 06/21/2018  . Secondary hyperparathyroidism of renal origin ([place]) 06/20/2018  . Diastolic dysfunction [id number]  . Hyperkalemia 11/19/2017  . Psoriasis (a type of skin inflammation) 10/19/2016  . Primary osteoarthritis of both knees 10/01/2015  . Obesity, Class III, BMI 40-49.9 (morbid obesity) ([place]) 10/01/2015  . Hepatomegaly 09/19/2015  . Splenomegaly 09/19/2015  . ANA positive 07/21/2015  . Hepatic fibrosis 04/16/2015  . Charcot ankle 04/02/2015  . Thrombocytopenia ([place]) 02/14/2015  . Allergic rhinitis 02/12/2015  . Bell palsy 02/12/2015  . Carpal tunnel syndrome 02/12/2015  . Clinical depression 02/12/2015  . Diabetes mellitus with neuropathy ([place]) 02/12/2015  . Erectile dysfunction 02/12/2015  . HLD (hyperlipidemia) 02/12/2015  . Hypertension 02/12/2015  . Hypothyroidism 02/12/2015  . Anemia, iron deficiency 02/12/2015  . Low testosterone 02/12/2015  . Neuropathy 02/12/2015  . Peripheral neuropathy 02/12/2015  . Obstructive sleep apnea 02/12/2015  . Gout 05/05/2012  . Gastric ulcer 11/21/2009  PCP:  [name], MD Pharmacy:   [facility] [address] [place]) [place] [address] Phone: [phone] Fax: [phone]  [facility] [place], [place] [address] Phone: [phone] Fax: [phone]  [facility] [place], [place] [address] [place] [address] Phone: [phone] Fax: [place], [place] [address] [place] [address] Phone: [phone] Fax: [phone]     Social Determinants of Health ([place]) Interventions    Readmission Risk Interventions No flowsheet data found.

## 2019-03-04 NOTE — Consult Note (Signed)
[place] for heparin drip management Indication: atrial fibrillation  Patient Measurements: Height: 5\' 11"  (180.3 cm) Weight: 294 lb (133.4 kg) IBW/kg (Calculated) : 75.3 Heparin Dosing Weight: 105.9 kg  Vital Signs: Temp: 98.7 F (37.1 C) (11/01 0808) Temp Source: Oral (11/01 0808) BP: 126/83 (11/01 0855) Pulse Rate: 161 (11/01 0855)  Labs: Recent Labs    03/01/19 1733 03/01/19 1927 03/02/19 0017 03/02/19 0607 03/02/19 1452 03/03/19 0602 03/04/19 0548  HGB 8.7*  --  8.8* 9.0*  --   --  8.5*  HCT 26.0*  --  27.0* 28.0*  --   --  26.5*  PLT 128*  --  135* 137*  --   --  123*  APTT  --  34  --   --   --   --   --   LABPROT  --  13.3  --   --   --   --   --   INR  --  1.0  --   --   --   --   --   HEPARINUNFRC  --   --   --  0.12* <0.10*  --   --   CREATININE 4.43*  --  4.30*  --   --  4.40* 4.11*  TROPONINIHS 205* 200*  --   --   --   --   --     Estimated Creatinine Clearance: 26.3 mL/min (A) (by C-G formula based on SCr of 4.11 mg/dL (H)).   Medical History: Past Medical History:  Diagnosis Date  . Allergy   . Anemia   . Arthritis   . Bell's palsy   . CHF (congestive heart failure) ([place])   . CKD (chronic kidney disease)   . Depression   . Diabetes ([place])   . Gastric ulcer   . GERD (gastroesophageal reflux disease)   . Gout   . History of chicken pox   . Hyperlipidemia   . Hypertension   . Hypothyroidism   . OSA (obstructive sleep apnea)   . Thyroid disease   . Vitamin D deficiency     Medications:  Scheduled:  . amitriptyline  25 mg Oral QHS  . atorvastatin  40 mg Oral Daily  . azithromycin  500 mg Oral Daily  . calcitRIOL  0.5 mcg Oral Daily  . diltiazem  20 mg Intravenous Once  . fluticasone furoate-vilanterol  1 puff Inhalation Daily  . hydrALAZINE  25 mg Oral TID  . insulin aspart  0-15 Units Subcutaneous TID WC  . insulin aspart  0-5 Units Subcutaneous QHS  . insulin regular human CONCENTRATED  25  Units Subcutaneous BID WC  . levothyroxine  100 mcg Oral Daily  . loratadine  10 mg Oral Daily  . mouth rinse  15 mL Mouth Rinse BID  . pantoprazole  40 mg Oral BID  . pneumococcal 23 valent vaccine  0.5 mL Intramuscular Tomorrow-1000  . senna  1 tablet Oral BID  . vitamin B-12  1,000 mcg Oral Daily    Assessment: 63 year old man PMH CKD stage IV, CHF, diabetes, obstructive sleep apnea on CPAP, presented with increasing shortness of breath, cough.  Admitted for multifocal pneumonia. HR 130-150's new onset A.fib.  Rest of VSS. Pt on HFNC. He is on no chronic anticoagulation PTA. Baselin labs: INR 1.0, aPTT 34s, Hgb 8.5, PLT 123  Goal of Therapy:  Heparin level 0.3-0.7 units/ml Monitor platelets by anticoagulation protocol: Yes   Plan:  Give 6000  units bolus x 1 Start heparin infusion at 1600 units/hr Check anti-Xa level in 8 hours and daily while on heparin Continue to monitor H&H and platelets  Dallie Piles, PharmD 03/04/2019,9:38 AM

## 2019-03-04 NOTE — Progress Notes (Addendum)
PROGRESS NOTE  Patrick Hurst [id number] DOB: 24-Jun-1955 DOA: 03/01/2019 PCP: [name], MD  Brief History   63 year old man PMH CKD stage IV, CHF, diabetes, obstructive sleep apnea on CPAP, presented with increasing shortness of breath, cough.  Admitted for multifocal pneumonia.    A & P  Acute hypoxic respiratory failure secondary to multifocal pneumonia seen on chest CT, in context of COPD, previous smoker --Increased oxygen needs since yesterday with advancement to high flow nasal cannula overnight. --Respiratory status appears grossly stable on exam, no need for BiPAP or intubation at this time --Continue empiric antibiotics ceftriaxone and Zithromax --Steroids, bronchodilators.  Atrial fibrillation with rapid ventricular response --New diagnosis this a.m., no previous history of per patient.  Vitals otherwise stable. --EKG shows atrial fibrillation with rapid ventricular response, rate related ST changes laterally.  No STEMI. 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  Followed by Dr. [name]  --Creatinine slightly better today, BUN stable.  Diabetes mellitus type 2, hemoglobin A1c 6.1 --CBG stable --Continue sliding scale insulin, concentrated Humulin  Hypothyroidism --Continue levothyroxine  Obstructive sleep apnea on CPAP at night --Continue CPAP at night  Morbid obesity Body mass index is 41 kg/m.  Aortic atherosclerosis  . Remain inpatient given increased oxygen need and new atrial fibrillation with rapid ventricular response.  Discussed with nursing, will need transfer to stepdown unit, no beds available in cardiology.   Rate remains uncontrolled.  Cardiology Dr. Clayborn Bigness consulted.  On high flow 12 L.  Appears relatively stable on examination, able to speak in full sentences lungs fairly clear, remains  tachycardic.  Discussed with Dr. Patsey Berthold, will consult pulmonology for assistance.  DVT prophylaxis: heparin gtt Code Status: Full Family Communication: none Disposition Plan: home    [name], MD  [facility] Direct contact: see [email or web address] (further directions at bottom of note if needed) 7PM-7AM contact night coverage as at bottom of note 03/04/2019, 9:16 AM  LOS: 3 days   Significant Hospital Events   . 10/29 admitted for multifocal pneumonia . 11/1 developed new onset atrial fibrillation with rapid ventricular response, transferred to stepdown unit for diltiazem infusion   Consults:  . Nephrology   Procedures:  .   Significant Diagnostic Tests:  . 10/29 CT chest: Multifocal parenchymal opacities right greater than left consistent with multifocal pneumonia.   Micro Data:  .    Antimicrobials:  . Ceftriaxone 10/29 > . Azithromycin 10/29 >  Interval History/Subjective  Discussed with RN, oxygenation worsened yesterday and went up to high flow nasal cannula overnight.  Currently on 10 L high flow. This a.m. the patient developed atrial fibrillation with rapid ventricular response. Patient slept okay last night, used BiPAP.  This a.m. he feels weak.  No chest pain.  Objective   Vitals:  Vitals:   03/04/19 0820 03/04/19 0855  BP:  126/83  Pulse:  (!) 161  Resp:  18  Temp:    SpO2: 92% 91%    Exam:  Constitutional: Appears calm, mildly weak, ill but not toxic Eyes: Pupils and irises appear grossly unremarkable ENT: Grossly normal hearing Respiratory: Clear to auscultation bilaterally.  No wheezes, rales or rhonchi.  Normal respiratory effort. Cardiovascular: Tachycardic, regular, no murmur, rub or gallop.  No lower extremity edema.  Telemetry atrial fibrillation with rapid ventricular response. Abdomen: Soft, nontender, nondistended. Skin: Limited exam, no rash or induration noted Psychiatric: Grossly normal mood  and affect.  Speech fluent and  appropriate.  I have personally reviewed the following:   Today's Data  . BUN 93, creatinine 4.11 anion gap within normal limits, potassium 4.3. . Hemoglobin 8.5, stable.  Platelets slightly lower at 123. . CBG stable  Scheduled Meds: . amitriptyline  25 mg Oral QHS  . atorvastatin  40 mg Oral Daily  . azithromycin  500 mg Oral Daily  . calcitRIOL  0.5 mcg Oral Daily  . diltiazem  20 mg Intravenous Once  . fluticasone furoate-vilanterol  1 puff Inhalation Daily  . hydrALAZINE  25 mg Oral TID  . insulin aspart  0-15 Units Subcutaneous TID WC  . insulin aspart  0-5 Units Subcutaneous QHS  . insulin regular human CONCENTRATED  25 Units Subcutaneous BID WC  . levothyroxine  100 mcg Oral Daily  . loratadine  10 mg Oral Daily  . mouth rinse  15 mL Mouth Rinse BID  . pantoprazole  40 mg Oral BID  . pneumococcal 23 valent vaccine  0.5 mL Intramuscular Tomorrow-1000  . senna  1 tablet Oral BID  . vitamin B-12  1,000 mcg Oral Daily   Continuous Infusions: . sodium chloride 250 mL (03/04/19 0659)  . cefTRIAXone (ROCEPHIN)  IV 1 g (03/04/19 0659)  . diltiazem (CARDIZEM) infusion      Principal Problem:   Acute hypoxemic respiratory failure (HCC) Active Problems:   Diabetes mellitus with neuropathy (HCC)   Hypothyroidism   Thrombocytopenia (HCC)   Obesity, Class III, BMI 40-49.9 (morbid obesity) (HCC)   COPD (chronic obstructive pulmonary disease) (HCC)   Multifocal pneumonia   CKD (chronic kidney disease), stage V ([place])   LOS: 3 days   How to contact the [facility] Attending or Consulting provider 7A - 7P or covering provider during after hours [place], for this patient?  1. Check the care team in [facility] and look for a) attending/consulting TRH provider listed and b) the [facility] team listed 2. Log into [email or web address] and use [place]'s universal password to access. If you do not have the password, please contact the hospital operator. 3. Locate the [facility] provider you are looking for under  [facility] and page to a number that you can be directly reached. 4. If you still have difficulty reaching the provider, please page the [facility] (Director on Call) for the Hospitalists listed on amion for assistance.

## 2019-03-04 NOTE — Consult Note (Signed)
[place] for heparin drip management Indication: atrial fibrillation  Patient Measurements: Height: 5\' 11"  (180.3 cm) Weight: 298 lb 4.5 oz (135.3 kg) IBW/kg (Calculated) : 75.3 Heparin Dosing Weight: 105.9 kg  Vital Signs: Temp: 99 F (37.2 C) (11/01 1930) Temp Source: Oral (11/01 1930) BP: 175/62 (11/01 2000) Pulse Rate: 119 (11/01 2000)  Labs: Recent Labs    03/02/19 0017 03/02/19 0607 03/02/19 1452 03/03/19 0602 03/04/19 0548 03/04/19 1200 03/04/19 1724 03/04/19 2032  HGB 8.8* 9.0*  --   --  8.5*  --   --   --   HCT 27.0* 28.0*  --   --  26.5*  --   --   --   PLT 135* 137*  --   --  123*  --   --   --   APTT  --   --   --   --   --  32  --   --   HEPARINUNFRC  --  0.12* <0.10*  --   --   --   --  <0.10*  CREATININE 4.30*  --   --  4.40* 4.11*  --   --   --   TROPONINIHS  --   --   --   --   --   --  232* 469*    Estimated Creatinine Clearance: 26.5 mL/min (A) (by C-G formula based on SCr of 4.11 mg/dL (H)).   Medical History: Past Medical History:  Diagnosis Date  . Allergy   . Anemia   . Arthritis   . Bell's palsy   . CHF (congestive heart failure) ([place])   . CKD (chronic kidney disease)   . Depression   . Diabetes ([place])   . Gastric ulcer   . GERD (gastroesophageal reflux disease)   . Gout   . History of chicken pox   . Hyperlipidemia   . Hypertension   . Hypothyroidism   . OSA (obstructive sleep apnea)   . Thyroid disease   . Vitamin D deficiency     Medications:  Scheduled:  . amitriptyline  25 mg Oral QHS  . atorvastatin  40 mg Oral Daily  . azithromycin  500 mg Oral Daily  . calcitRIOL  0.5 mcg Oral Daily  . Chlorhexidine Gluconate Cloth  6 each Topical Daily  . fluticasone furoate-vilanterol  1 puff Inhalation Daily  . hydrALAZINE  25 mg Oral TID  . insulin aspart  0-15 Units Subcutaneous TID WC  . insulin aspart  0-5 Units Subcutaneous QHS  . insulin regular human CONCENTRATED  15 Units  Subcutaneous BID WC  . ipratropium-albuterol  3 mL Nebulization Q6H  . levothyroxine  100 mcg Oral Daily  . loratadine  10 mg Oral Daily  . mouth rinse  15 mL Mouth Rinse BID  . pantoprazole  40 mg Oral BID  . pneumococcal 23 valent vaccine  0.5 mL Intramuscular Tomorrow-1000  . senna  1 tablet Oral BID  . vitamin B-12  1,000 mcg Oral Daily    Assessment: 63 year old man PMH CKD stage IV, CHF, diabetes, obstructive sleep apnea on CPAP, presented with increasing shortness of breath, cough.  Admitted for multifocal pneumonia. HR 130-150's new onset A.fib.  Rest of VSS. Pt on HFNC. He is on no chronic anticoagulation PTA. Baselin labs: INR 1.0, aPTT 34s, Hgb 8.5, PLT 123  11/1@2032 : HL<0.10, spoke to nurse, believes the heparin may have possibly been stopped while transporting patient from the floor to ICU,  but unlikely to have been for more than a few minutes.  Goal of Therapy:  Heparin level 0.3-0.7 units/ml Monitor platelets by anticoagulation protocol: Yes   Plan:  Will rebolus with 4000 units x 1 Increase heparin infusion to 1850 units/hr Check anti-Xa level in 8 hours and daily while on heparin Continue to monitor H&H and platelets  Pearla Dubonnet, PharmD 03/04/2019,10:10 PM

## 2019-03-04 NOTE — Progress Notes (Addendum)
Rough day. Short of breath most of the day as well as in A.Fib 117-140s. Never converted to SR. Dr. Sarajane Jews consulted Dr. Duwayne Heck and Cardiology for help with patient. Oxygen changed to warm high flow with an immediate increase in oxygen saturation to 97%. Have not see Cardiology yet. Patient remains in A.Fib. rate of 101.

## 2019-03-04 NOTE — Progress Notes (Addendum)
Pt's HR 130-150's new onset A.fib.  Rest of VSS. Pt on HFNC. Denies pain. Dr Sarajane Jews notified.  MD to place an order for EKG.   Notified Dr Sarajane Jews that EKG machine missing a piece, NT looking for a working EKG machine. HR still in the 150's Pt feels weak. MD to see pt.  MD present in pt's room and will transfer pt to stepdown unit.

## 2019-03-05 DIAGNOSIS — I5033 Acute on chronic diastolic (congestive) heart failure: Secondary | ICD-10-CM

## 2019-03-05 DIAGNOSIS — I4891 Unspecified atrial fibrillation: Secondary | ICD-10-CM

## 2019-03-05 DIAGNOSIS — Z8679 Personal history of other diseases of the circulatory system: Secondary | ICD-10-CM

## 2019-03-05 DIAGNOSIS — R778 Other specified abnormalities of plasma proteins: Secondary | ICD-10-CM

## 2019-03-05 LAB — CBC
HCT: 24.8 % — ABNORMAL LOW (ref 39.0–52.0)
Hemoglobin: 8.1 g/dL — ABNORMAL LOW (ref 13.0–17.0)
MCH: 28.9 pg (ref 26.0–34.0)
MCHC: 32.7 g/dL (ref 30.0–36.0)
MCV: 88.6 fL (ref 80.0–100.0)
Platelets: 115 10*3/uL — ABNORMAL LOW (ref 150–400)
RBC: 2.8 MIL/uL — ABNORMAL LOW (ref 4.22–5.81)
RDW: 15.3 % (ref 11.5–15.5)
WBC: 7.2 10*3/uL (ref 4.0–10.5)
nRBC: 0 % (ref 0.0–0.2)

## 2019-03-05 LAB — COMPREHENSIVE METABOLIC PANEL
ALT: 16 U/L (ref 0–44)
AST: 13 U/L — ABNORMAL LOW (ref 15–41)
Albumin: 3.3 g/dL — ABNORMAL LOW (ref 3.5–5.0)
Alkaline Phosphatase: 57 U/L (ref 38–126)
Anion gap: 10 (ref 5–15)
BUN: 93 mg/dL — ABNORMAL HIGH (ref 8–23)
CO2: 23 mmol/L (ref 22–32)
Calcium: 8.5 mg/dL — ABNORMAL LOW (ref 8.9–10.3)
Chloride: 104 mmol/L (ref 98–111)
Creatinine, Ser: 4.08 mg/dL — ABNORMAL HIGH (ref 0.61–1.24)
GFR calc Af Amer: 17 mL/min — ABNORMAL LOW (ref >60)
GFR calc non Af Amer: 15 mL/min — ABNORMAL LOW (ref >60)
Glucose, Bld: 215 mg/dL — ABNORMAL HIGH (ref 70–99)
Potassium: 4.2 mmol/L (ref 3.5–5.1)
Sodium: 137 mmol/L (ref 135–145)
Total Bilirubin: 1 mg/dL (ref 0.3–1.2)
Total Protein: 7.1 g/dL (ref 6.5–8.1)

## 2019-03-05 LAB — GLUCOSE, CAPILLARY
Glucose-Capillary: 236 mg/dL — ABNORMAL HIGH (ref 70–99)
Glucose-Capillary: 201 mg/dL — ABNORMAL HIGH (ref 70–99)
Glucose-Capillary: 276 mg/dL — ABNORMAL HIGH (ref 70–99)
Glucose-Capillary: 243 mg/dL — ABNORMAL HIGH (ref 70–99)
Glucose-Capillary: 202 mg/dL — ABNORMAL HIGH (ref 70–99)
Glucose-Capillary: 208 mg/dL — ABNORMAL HIGH (ref 70–99)

## 2019-03-05 LAB — HEPARIN LEVEL (UNFRACTIONATED): Heparin Unfractionated: 0.16 IU/mL — ABNORMAL LOW (ref 0.30–0.70)

## 2019-03-05 LAB — MAGNESIUM: Magnesium: 2.1 mg/dL (ref 1.7–2.4)

## 2019-03-05 LAB — PHOSPHORUS: Phosphorus: 5.2 mg/dL — ABNORMAL HIGH (ref 2.5–4.6)

## 2019-03-05 LAB — PROCALCITONIN: Procalcitonin: 0.36 ng/mL

## 2019-03-05 LAB — TROPONIN I (HIGH SENSITIVITY): Troponin I (High Sensitivity): 525 ng/L (ref <18)

## 2019-03-05 MED ORDER — METOPROLOL TARTRATE 25 MG PO TABS
25.0000 mg | ORAL_TABLET | Freq: Two times a day (BID) | ORAL | Status: DC
  Administered 2019-03-05 – 2019-03-07 (×7): 25 mg via ORAL
  Filled 2019-03-05 (×7): qty 1

## 2019-03-05 MED ORDER — FUROSEMIDE 10 MG/ML IJ SOLN
40.0000 mg | Freq: Two times a day (BID) | INTRAMUSCULAR | Status: DC
  Administered 2019-03-05 – 2019-03-06 (×3): 40 mg via INTRAVENOUS
  Filled 2019-03-05 (×2): qty 4

## 2019-03-05 MED ORDER — INSULIN REGULAR HUMAN (CONC) 500 UNIT/ML ~~LOC~~ SOPN
20.0000 [IU] | PEN_INJECTOR | Freq: Two times a day (BID) | SUBCUTANEOUS | Status: DC
  Administered 2019-03-05 – 2019-03-06 (×2): 20 [IU] via SUBCUTANEOUS
  Filled 2019-03-05: qty 3

## 2019-03-05 MED ORDER — HEPARIN BOLUS VIA INFUSION
3000.0000 [IU] | INTRAVENOUS | Status: AC
  Administered 2019-03-05: 3000 [IU] via INTRAVENOUS
  Filled 2019-03-05: qty 3000

## 2019-03-05 MED ORDER — ENOXAPARIN SODIUM 40 MG/0.4ML ~~LOC~~ SOLN
40.0000 mg | Freq: Every day | SUBCUTANEOUS | Status: DC
  Administered 2019-03-05 – 2019-03-06 (×2): 40 mg via SUBCUTANEOUS
  Filled 2019-03-05 (×2): qty 0.4

## 2019-03-05 MED ORDER — METOPROLOL TARTRATE 5 MG/5ML IV SOLN
5.0000 mg | INTRAVENOUS | Status: DC | PRN
  Administered 2019-03-05: 01:00:00 5 mg via INTRAVENOUS
  Filled 2019-03-05 (×3): qty 5

## 2019-03-05 MED FILL — Diltiazem HCl IV Soln 25 MG/5ML (5 MG/ML): INTRAVENOUS | Qty: 25 | Status: AC

## 2019-03-05 MED FILL — Dextrose Inj 5%: INTRAVENOUS | Qty: 100 | Status: AC

## 2019-03-05 NOTE — Progress Notes (Signed)
Pharmacy Electrolyte Monitoring Consult:  Patrick Hurst is a 66 YOM admitted on 03/01/2019 with shortness of breath and cough. PMH includes: CKD stage IV, CHF, DM, obstructive sleep apnea on CPAP.  He is being treated for multifocal pneumonia in the ICU.  Patient presented in atrial fibrillation with RVR, which resolved within 48 hours.   Pharmacy has been consulted to assist in monitoring and replacing electrolytes.  Labs:  Sodium (mmol/L)  Date Value  03/05/2019 137  04/04/2018 145 (H)  05/04/2013 137   Potassium (mmol/L)  Date Value  03/05/2019 4.2  05/04/2013 4.4   Magnesium (mg/dL)  Date Value  03/05/2019 2.1  03/14/2012 1.9   Phosphorus (mg/dL)  Date Value  03/05/2019 5.2 (H)   Calcium (mg/dL)  Date Value  03/05/2019 8.5 (L)   Calcium, Total (mg/dL)  Date Value  05/04/2013 9.1   Albumin (g/dL)  Date Value  03/05/2019 3.3 (L)  04/04/2018 4.3  05/04/2013 3.3 (L)    Assessment/Plan:  Electrolytes:  - no electrolyte replacement warranted at this time. - will check labs in AM  Goals: Mg ~ 2 K ~ 4  Glucose: - U500 dose increased from 15 units BID to 20 units BID per conversation with diabetes team.  Thank you for allowing pharmacy to be a part of this patient's care.  Raiford Simmonds, PharmD Candidate 03/05/2019 1:50 PM

## 2019-03-05 NOTE — Consult Note (Signed)
ANTICOAGULATION CONSULT NOTE  Pharmacy Consult for heparin drip management Indication: atrial fibrillation  Patient Measurements: Height: 5\' 11"  (180.3 cm) Weight: 298 lb 4.5 oz (135.3 kg) IBW/kg (Calculated) : 75.3 Heparin Dosing Weight: 105.9 kg  Vital Signs: Temp: 98.4 F (36.9 C) (11/02 0230) Temp Source: Oral (11/02 0230) BP: 131/75 (11/02 0600) Pulse Rate: 72 (11/02 0600)  Labs: Recent Labs    03/02/19 1452 03/03/19 0602 03/04/19 0548 03/04/19 1200 03/04/19 1724 03/04/19 2032 03/05/19 0419 03/05/19 0602  HGB  --   --  8.5*  --   --   --  8.1*  --   HCT  --   --  26.5*  --   --   --  24.8*  --   PLT  --   --  123*  --   --   --  115*  --   APTT  --   --   --  32  --   --   --   --   HEPARINUNFRC <0.10*  --   --   --   --  <0.10*  --  0.16*  CREATININE  --  4.40* 4.11*  --   --   --   --   --   TROPONINIHS  --   --   --   --  232* 469*  --   --     Estimated Creatinine Clearance: 26.5 mL/min (A) (by C-G formula based on SCr of 4.11 mg/dL (H)).   Medical History: Past Medical History:  Diagnosis Date  . Allergy   . Anemia   . Arthritis   . Bell's palsy   . CHF (congestive heart failure) ([place])   . CKD (chronic kidney disease)   . Depression   . Diabetes ([place])   . Gastric ulcer   . GERD (gastroesophageal reflux disease)   . Gout   . History of chicken pox   . Hyperlipidemia   . Hypertension   . Hypothyroidism   . OSA (obstructive sleep apnea)   . Thyroid disease   . Vitamin D deficiency     Medications:  Scheduled:  . amitriptyline  25 mg Oral QHS  . atorvastatin  40 mg Oral Daily  . azithromycin  500 mg Oral Daily  . calcitRIOL  0.5 mcg Oral Daily  . Chlorhexidine Gluconate Cloth  6 each Topical Daily  . fluticasone furoate-vilanterol  1 puff Inhalation Daily  . hydrALAZINE  25 mg Oral TID  . insulin aspart  0-15 Units Subcutaneous TID WC  . insulin aspart  0-5 Units Subcutaneous QHS  . insulin regular human CONCENTRATED  15 Units  Subcutaneous BID WC  . ipratropium-albuterol  3 mL Nebulization Q6H  . levothyroxine  100 mcg Oral Daily  . loratadine  10 mg Oral Daily  . mouth rinse  15 mL Mouth Rinse BID  . metoprolol tartrate  25 mg Oral BID  . pantoprazole  40 mg Oral BID  . pneumococcal 23 valent vaccine  0.5 mL Intramuscular Tomorrow-1000  . senna  1 tablet Oral BID  . vitamin B-12  1,000 mcg Oral Daily    Assessment: 63 year old man PMH CKD stage IV, CHF, diabetes, obstructive sleep apnea on CPAP, presented with increasing shortness of breath, cough.  Admitted for multifocal pneumonia. HR 130-150's new onset A.fib.  Rest of VSS. Pt on HFNC. He is on no chronic anticoagulation PTA. Baselin labs: INR 1.0, aPTT 34s, Hgb 8.5, PLT 123  11/1@2032 : HL<0.10, spoke to  nurse, believes the heparin may have possibly been stopped while transporting patient from the floor to ICU, but unlikely to have been for more than a few minutes. 11/2 @ 0602 HL = 0.16, subtherapeutic, confirmed w/ RN no problems w/ infusion.  Goal of Therapy:  Heparin level 0.3-0.7 units/ml Monitor platelets by anticoagulation protocol: Yes   Plan:  Will rebolus with 3000 units x 1 Increase heparin infusion to 2150 units/hr Check anti-Xa level in 8 hours and daily while on heparin Continue to monitor H&H and platelets  Ena Dawley, PharmD 03/05/2019,6:40 AM

## 2019-03-05 NOTE — Progress Notes (Signed)
PROGRESS NOTE  Patrick Hurst [id number] DOB: Mar 03, 1956 DOA: 03/01/2019 PCP: [name], MD  Brief History   63 year old man PMH CKD stage IV, CHF, diabetes, obstructive sleep apnea on CPAP, presented with increasing shortness of breath, cough.  Admitted for multifocal pneumonia.  Treated with antibiotics and oxygen.  Subsequently developed atrial fibrillation with rapid ventricular response and was transferred to the stepdown unit.  A & P  Acute hypoxic respiratory failure secondary to multifocal pneumonia seen on chest CT, in context of COPD, previous smoker --Chest x-ray last evening showed developing right hilar infiltrate.  Pulmonary vascular congestion. --Still on high flow oxygen by respiratory status appears better today.  Continue empiric antibiotics.  Wean oxygen as tolerated.  Acute on chronic diastolic CHF --Excellent diuresis 3600 mL overnight.  -2.29 L since admission.  No evidence of volume overload lower extremities.  Atrial fibrillation with rapid ventricular response --New diagnosis this admission, developed 11/1.  Converted to sinus rhythm overnight.  Initially treated with IV diltiazem, then IV amiodarone without significant effect.  Converted with IV push metoprolol. --Continue management per cardiology, currently on oral metoprolol, IV diltiazem and IV amiodarone. --TSH within normal limits --Follow-up echocardiogram  Elevated troponin -- Troponin high-sensitivity elevated 232 > 469 --Asymptomatic.  No signs or symptoms to suggest ACS.  Suspect demand ischemia secondary to elevated heart rate in context of acute on chronic lung disease.  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  Followed by Dr. [name]  --Creatinine appears stable.  Diabetes mellitus type 2, hemoglobin A1c 6.1 --Fair control.  Continue concentrated Humulin, sliding scale insulin.  Normocytic anemia appears stable.   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  Hypothyroidism --TSH within normal limits.  Continue levothyroxine.  Obstructive sleep apnea on CPAP at night --Tolerating hospital unit poorly.  Plan for wife to bring in home unit.  Morbid obesity Body mass index is 41.6 kg/m.  Aortic atherosclerosis  . Overall some improvement today with conversion to sinus rhythm, excellent diuresis but still requiring high flow nasal cannula.  Appreciate cardiology and pulmonology consultations.  Hopefully can transition to oral rate control agents and off heparin today.  Wean oxygen as tolerated.   DVT prophylaxis: heparin gtt Code Status: Full Family Communication: none Disposition Plan: home    [name], MD  [facility] Direct contact: see [email or web address] (further directions at bottom of note if needed) 7PM-7AM contact night coverage as at bottom of note 03/05/2019, 8:00 AM  LOS: 4 days   Significant Hospital Events   . 10/29 admitted for multifocal pneumonia . 11/1 developed new onset atrial fibrillation with rapid ventricular response, transferred to stepdown unit for diltiazem infusion   Consults:  . Nephrology . Cardiology . Pulmonology   Procedures:  .   Significant Diagnostic Tests:  . 10/29 CT chest: Multifocal parenchymal opacities right greater than left consistent with multifocal pneumonia.   Micro Data:  .    Antimicrobials:  . Ceftriaxone 10/29 > . Azithromycin 10/29 >  Interval History/Subjective  Not tolerating hospital CPAP very well.  Poor sleep.  However overall feeling better, breathing better.  No nausea or vomiting.  No pain.  Converted to sinus rhythm overnight after both diltiazem and amiodarone, IV push metoprolol work.  Objective   Vitals:  Vitals:   03/05/19 0630 03/05/19 0749  BP: (!) 155/106   Pulse: 81 71  Resp: (!) 24 (!) 23  Temp:    SpO2: 99% 97%    Exam:  Constitutional: Appears calm, comfortable.  Sitting up in chair.  Appears better today.  Eyes: Pupils, irises, and lids appear unremarkable ENT: Grossly normal hearing Respiratory: Clear to auscultation bilaterally with diminished breath sounds, poor air movement, mild increased respiratory effort.  No wheezes, rales or rhonchi. Cardiovascular: Regular rate and rhythm.  2/6 systolic murmur right upper sternal border.  No lower extremity edema. Abdomen: Soft Psychiatric: Grossly normal mood and affect.  Speech fluent and appropriate. Skin: Lower legs appear grossly unremarkable.  I have personally reviewed the following:   Today's Data  CBG stable, fasting blood sugar 201 Potassium within normal limits.  BUN stable, creatinine without significant change 4.08.  Anion gap within normal limits.  Magnesium within normal limits.  LFTs unremarkable. BNP 315 Troponin high-sensitivity elevated 232 > 469 Procalcitonin .36 Hemoglobin 8.1, stable.  Platelets slightly lower at 115.  WBC 7.2.  Scheduled Meds: . amitriptyline  25 mg Oral QHS  . atorvastatin  40 mg Oral Daily  . azithromycin  500 mg Oral Daily  . calcitRIOL  0.5 mcg Oral Daily  . Chlorhexidine Gluconate Cloth  6 each Topical Daily  . fluticasone furoate-vilanterol  1 puff Inhalation Daily  . hydrALAZINE  25 mg Oral TID  . insulin aspart  0-15 Units Subcutaneous TID WC  . insulin aspart  0-5 Units Subcutaneous QHS  . insulin regular human CONCENTRATED  15 Units Subcutaneous BID WC  . ipratropium-albuterol  3 mL Nebulization Q6H  . levothyroxine  100 mcg Oral Daily  . loratadine  10 mg Oral Daily  . mouth rinse  15 mL Mouth Rinse BID  . metoprolol tartrate  25 mg Oral BID  . pantoprazole  40 mg Oral BID  . pneumococcal 23 valent vaccine  0.5 mL Intramuscular Tomorrow-1000  . senna  1 tablet Oral BID  . vitamin B-12  1,000 mcg Oral Daily   Continuous Infusions: . sodium chloride Stopped (03/04/19 0845)  . amiodarone 30 mg/hr (03/05/19 0600)  . cefTRIAXone (ROCEPHIN)  IV Stopped (03/04/19 0737)  . diltiazem  (CARDIZEM) infusion Stopped (03/05/19 0400)  . heparin 2,150 Units/hr (03/05/19 [id number])    Principal Problem:   Acute hypoxemic respiratory failure (HCC) Active Problems:   Diabetes mellitus with neuropathy (HCC)   Hypothyroidism   Thrombocytopenia (HCC)   Obesity, Class III, BMI 40-49.9 (morbid obesity) ([place])   Acute respiratory failure with hypoxia (HCC)   COPD (chronic obstructive pulmonary disease) (HCC)   Multifocal pneumonia   CKD (chronic kidney disease) stage 5, GFR less than 15 ml/min (HCC)   Atrial fibrillation with RVR ([place])   LOS: 4 days   How to contact the [facility] Attending or Consulting provider [place] or covering provider during after hours [place], for this patient?  1. Check the care team in [facility] and look for a) attending/consulting TR[facility] provider listed and b) the [facility] team listed 2. Log into [email or web address] and use [place]'s universal password to access. If you do not have the password, please contact the hospital operator. 3. Locate the [facility] provider you are looking for under [facility] and page to a number that you can be directly reached. 4. If you still have difficulty reaching the provider, please page the [facility] (Director on Call) for the Hospitalists listed on amion for assistance.

## 2019-03-05 NOTE — Progress Notes (Signed)
Spoke to Dr. Clayborn Bigness regarding pt's persistent afib RVR despite cardizem and amio drips.  Pt's BP also elevated with SBP 150s-170s.  Per Dr. Clayborn Bigness, goal HR 100-110s with SBP>100.  Give 5mg  metoprolol IVP x3 5 mins apart to control rate as long as SBP>100, then start 25mg  PO metoprolol BID.  Patient refusing to wear CPAP at this time after wearing it for ~2.5hrs.  HFNC at 40L/88% fiO2 resumed.

## 2019-03-05 NOTE — Progress Notes (Signed)
PULMONARY / CRITICAL CARE MEDICINE  Name: Patrick Hurst MRN: [id number] DOB: 1955/11/20    LOS: 4  Referring Provider:  Dr [name] Reason for Referral: Acute hypoxic respiratory failure  Brief patient description: 63 Y/O male with OSA/OHS  Admitted with acute hypoxic respiratory failure, acute on chronic renal failure, and acute pulmonary edema   HPI: This is a 63 year old male with a medical history as indicated below who presented to the ED with worsening dyspnea and cough.  His chest x-ray showed diffuse infiltrates that was assumed to be multifocal pneumonia.  He was also found to be in acute on chronic renal failure stage IV with a creatinine level greater than 4 up from his baseline of 3.4.  He was started on Rocephin and Zithromax and placed on supplemental oxygen and admitted to the floor.  Today he developed A. fib with RVR with heart rate in the 150s and increasing oxygen needs and hence was transferred to the ICU for IV diltiazem, IV amiodarone and continuous BiPAP.  Repeat chest x-ray today also showed diffuse pulmonary edema. He is currently on high flow nasal cannula and his SPO2 stable in the high 90s.  His heart rate continues to fluctuate between 110 and 140 bpm on diltiazem and amiodarone infusions.  He reports improved dyspnea.  SIGNIFICANT EVENTS: 03/01/2019: Admitted 03/04/2019: Transferred to the ICU for worsening oxygen needs an elevated heart rate 11/2 -HHFNC down to 60%, bc today, stopping heparin gtt will transition to DVT ppx dose.   Past Medical History:  Diagnosis Date  . Allergy   . Anemia   . Arthritis   . Bell's palsy   . CHF (congestive heart failure) ([place])   . CKD (chronic kidney disease)   . Depression   . Diabetes ([place])   . Gastric ulcer   . GERD (gastroesophageal reflux disease)   . Gout   . History of chicken pox   . Hyperlipidemia   . Hypertension   . Hypothyroidism   . OSA (obstructive sleep apnea)   . Thyroid disease   . Vitamin  D deficiency    Past Surgical History:  Procedure Laterality Date  . CATARACT EXTRACTION Bilateral 2014 and 2015  . CHOLECYSTECTOMY  04/28/2011   Laproscopic; Dr. Pat Patrick  . GALLBLADDER SURGERY    . LASIK Bilateral 2019   medical  . SHOULDER SURGERY Right 2012   Dr. Leanor Kail   No current facility-administered medications on file prior to encounter.    Current Outpatient Medications on File Prior to Encounter  Medication Sig  . albuterol (VENTOLIN HFA) 108 (90 Base) MCG/ACT inhaler USE 2 INHALATIONS EVERY 4 HOURS AS NEEDED FOR WHEEZING OR SHORTNESS OF BREATH  . amitriptyline (ELAVIL) 25 MG tablet Take 1 tablet (25 mg total) by mouth at bedtime.  Marland Kitchen amLODipine (NORVASC) 10 MG tablet TAKE 1 TABLET DAILY  . Ascorbic Acid (VITAMIN C) 1000 MG tablet Take 1,000 mg by mouth 2 (two) times daily.   Marland Kitchen aspirin 81 MG tablet Take 81 mg by mouth daily.  Marland Kitchen atorvastatin (LIPITOR) 40 MG tablet Take 1 tablet (40 mg total) by mouth daily.  . calcitRIOL (ROCALTROL) 0.5 MCG capsule Take 0.5 mcg by mouth daily.  . cetirizine (ZYRTEC) 10 MG tablet Take 10 mg by mouth daily.   . fluticasone furoate-vilanterol (BREO ELLIPTA) 100-25 MCG/INH AEPB Inhale 1 puff into the lungs daily.  . hydrALAZINE (APRESOLINE) 25 MG tablet TAKE 1 TABLET THREE TIMES A DAY  . insulin regular human CONCENTRATED (  HUMULIN R) 500 UNIT/ML injection Inject up to 25 units twice a day, or as directed by physician  . ipratropium-albuterol (DUONEB) 0.5-2.5 (3) MG/3ML SOLN INHALE 3 ML BY NEBULIZATION EVERY 4 HOURS AS NEEDED  . levothyroxine (SYNTHROID) 100 MCG tablet Take 1 tablet (100 mcg total) by mouth daily.  . metoprolol tartrate (LOPRESSOR) 50 MG tablet TAKE 1 TABLET TWICE A DAY  . pantoprazole (PROTONIX) 40 MG tablet TAKE 1 TABLET TWICE A DAY  . torsemide (DEMADEX) 20 MG tablet Take 2 tablets (40 mg total) by mouth daily.  Marland Kitchen ULORIC 80 MG TABS Take 80 mg by mouth daily.   . vitamin B-12 (CYANOCOBALAMIN) 1000 MCG tablet Take 1,000  mcg by mouth daily.  . Misc. Devices (PULSE OXIMETER FOR FINGER) MISC 1 Device by Does not apply route daily as needed.  . tadalafil (CIALIS) 10 MG tablet TAKE ONE TABLET BY MOUTH DAILY AS NEEDED (Patient not taking: Reported on 03/02/2019)  . testosterone cypionate (DEPO-TESTOSTERONE) 200 MG/ML injection Inject 1 mL (200 mg total) into the muscle every 14 (fourteen) days.    Allergies Allergies  Allergen Reactions  . Guaifenesin Swelling    Throat swelling, increased heart rate.  . Mucinex  [Guaifenesin Er]     Throat swelling, increases heart rate  . Levaquin [Levofloxacin] Palpitations    Family History Family History  Problem Relation Age of Onset  . COPD Mother   . Heart disease Father    Social History  reports that he quit smoking about 40 years ago. His smoking use included cigarettes. He has a 15.00 pack-year smoking history. He has quit using smokeless tobacco. He reports that he does not drink alcohol or use drugs.  Review Of Systems:   Constitutional: Negative for fever and chills but positive for malaise.  HENT: Negative for congestion and rhinorrhea.  Eyes: Negative for redness and visual disturbance.  Respiratory: Positive for shortness of breath and cough but negative for wheezing Cardiovascular: Negative for chest pain and palpitations but positive for orthopnea.  Gastrointestinal: Negative  for nausea , vomiting and abdominal pain and  Loose stools Genitourinary: Negative for dysuria and urgency.  Endocrine: Denies polyuria, polyphagia and heat intolerance Musculoskeletal: Negative for myalgias and arthralgias.  Skin: Negative for pallor and wound.  Neurological: Negative for dizziness and headaches   VITAL SIGNS: BP (!) 151/71   Pulse 74   Temp 98.7 F (37.1 C)   Resp 14   Ht 5\' 11"  (1.803 m)   Wt 135.3 kg   SpO2 97%   BMI 41.60 kg/m   HEMODYNAMICS:    VENTILATOR SETTINGS: FiO2 (%):  [88 %-90 %] 88 %  INTAKE / OUTPUT: I/O last 3 completed  shifts: In: 1813.9 [P.O.:600; I.V.:1113.9; IV Piggyback:100] Out: 4975 [Urine:4975]  PHYSICAL EXAMINATION: General: Well-groomed, well-nourished, in mild respiratory distress HEENT: PERRLA, trachea midline, no JVD Neuro: Alert and oriented x4, no focal deficit Cardiovascular: Apical pulse irregular, tachycardic at 122 bpm, S1-S2, no murmur regurg or gallop, +2 pulses bilaterally, +2 edema bilateral Lungs: Bilateral breath sounds, diminished in the bases with bibasilar crackles Abdomen: Obese, normal bowel sounds in all 4 quadrants, palpation reveals no organomegaly Musculoskeletal: Positive range of motion, no joint deformities Skin: Warm and dry  LABS:  BMET Recent Labs  Lab 03/03/19 0602 03/04/19 0548 03/05/19 0602  NA 141 139 137  K 4.8 4.3 4.2  CL 105 106 104  CO2 22 23 23   BUN 89* 93* 93*  CREATININE 4.40* 4.11* 4.08*  GLUCOSE 208*  144* 215*    Electrolytes Recent Labs  Lab 03/03/19 0602 03/04/19 0548 03/05/19 0419 03/05/19 0602  CALCIUM 8.4* 8.3*  --  8.5*  MG  --  2.0  --  2.1  PHOS  --   --  5.2*  --     CBC Recent Labs  Lab 03/02/19 0607 03/04/19 0548 03/05/19 0419  WBC 7.9 9.3 7.2  HGB 9.0* 8.5* 8.1*  HCT 28.0* 26.5* 24.8*  PLT 137* 123* 115*    Coag's Recent Labs  Lab 03/01/19 1927 03/04/19 1200  APTT 34 32  INR 1.0  --     Sepsis Markers Recent Labs  Lab 03/01/19 1733 03/05/19 0602  PROCALCITON 0.26 0.36    ABG Recent Labs  Lab 03/03/19 1643  PHART 7.38  PCO2ART 37  PO2ART 66*    Liver Enzymes Recent Labs  Lab 03/05/19 0602  AST 13*  ALT 16  ALKPHOS 57  BILITOT 1.0  ALBUMIN 3.3*    Cardiac Enzymes No results for input(s): TROPONINI, PROBNP in the last 168 hours.  Glucose Recent Labs  Lab 03/04/19 0946 03/04/19 1126 03/04/19 1615 03/04/19 2147 03/05/19 0233 03/05/19 0725  GLUCAP 174* 182* 218* 251* 236* 201*    Imaging Dg Chest Port 1 View  Result Date: 03/04/2019 CLINICAL DATA:  Respiratory  distress EXAM: PORTABLE CHEST 1 VIEW COMPARISON:  March 03, 2019 FINDINGS: Increasing interstitial opacities throughout the left lung. More focal opacities are developing in the right base and right perihilar region. Cardiomegaly. The hila and mediastinum are unchanged. No pneumothorax. No other changes. IMPRESSION: 1. Developing focal infiltrate in the right perihilar region and right base worrisome for an infectious process/pneumonia. Mild increased interstitial markings in the left may represent pulmonary venous congestion. Electronically Signed   By: Dorise Bullion III M.D   On: 03/04/2019 19:06         STUDIES:  2D echo pending  CULTURES: COVID-19 negative MRSA screen negative  ANTIBIOTICS: Azithromycin 03/01/2019> Ceftriaxone 03/01/2019>    LINES/TUBES: Peripheral IVs    DISCUSSION: 63 year old male with a history of obstructive sleep apnea, obesity hypoventilation syndrome, on home CPAP, CKD stage IV and CHF now presenting with new onset A. fib with RVR, acute CHF exacerbation, acute pulmonary edema and possible pneumonia  ASSESSMENT / PLAN:  PULMONARY A: Acute hypoxic respiratory failure Community-acquired pneumonia Acute pulmonary edema Obstructive sleep apnea/obesity hypoventilation syndrome P:   Patient is noncompliant with BiPAP.  Recommend family brings in his home CPAP and he is to be a mandatory nocturnal CPAP IV diuresis Chest x-ray and ABG as needed Inhaled steroids and bronchodilators Influenza vaccine if patient agrees  CARDIOVASCULAR A:  New onset A. fib with RVR Acute CHF exacerbation P:  IV diuresis 2D echo Cardiology following Per cardiology, titrate off diltiazem infusion since his heart rate remains elevated and start metoprolol 5 mg IV every 5 minutes x3 doses until heart rate is less than 100 bpm.  Also start metoprolol 25 mg p.o. twice daily.  RENAL A:   Acute on chronic renal failure stage V P:   Renal indicis continue to  worsen hence patient may require dialysis Nephrology following Continue to monitor renal indices and avoid nephrotoxic drugs Monitor and correct electrolytes   HEMATOLOGIC A:   Chronic thrombocytopenia-platelet count down to 115 but history levels usually fluctuate between 110 and 135. P:  No acute bleeding.  Continue to monitor and transfuse as needed  INFECTIOUS A:   Community-acquired pneumonia P:   Antibiotics as  above Fever has resolved; no cultures pending  ENDOCRINE A:   Type 2 diabetes with hyperglycemia Hypothyroidism P:   Resume Humulin U5 100 at 15 units twice daily and hold for meal consumption less than 50% Continue blood glucose monitoring every 4 hours Continue current dose of Synthroid   Best Practice: Code Status: Full code Diet: Heart healthy/carb modified diet GI prophylaxis: Protonix 40 mg twice a day VTE prophylaxis: Already on full-strength heparin infusion  FAMILY  - Updates: Patient has decision-making capacity.  Already updated on current treatment plan   Critical care provider statement:    Critical care time (minutes):  32   Critical care time was exclusive of:  Separately billable procedures and  treating other patients   Critical care was necessary to treat or prevent imminent or  life-threatening deterioration of the following conditions:  Acute hypoxemic repiratory failure, community aquired pneumonia, AFrvr, OSA, mobid obesity, multiple comorbid conditions.    Critical care was time spent personally by me on the following  activities:  Development of treatment plan with patient or surrogate,  discussions with consultants, evaluation of patient's response to  treatment, examination of patient, obtaining history from patient or  surrogate, ordering and performing treatments and interventions, ordering  and review of laboratory studies and re-evaluation of patient's condition   I assumed direction of critical care for this patient from  another  provider in my specialty: no      Ottie Glazier, M.D.  Pulmonary & Critical Care Medicine  [facility]    NB: This document was prepared using Dragon voice recognition software and may include unintentional dictation errors.    03/05/2019, 9:58 AM

## 2019-03-05 NOTE — Consult Note (Signed)
Reason for Consult: Palpitation tachycardia atrial fibrillation rapid ventricle response Referring Physician: Dr. Fritzi Mandes hospitalist Dr. Lelon Huh primary  Patrick Hurst is an 63 y.o. male.  HPI: 63 year old obese white male obstructive sleep apnea shortness of breath bronchitis pneumonia on broad-spectrum antibiotic therapy presented with hypoxemia sats in the 80s congestive heart failure diabetes renal insufficiency hypertension hyperlipidemia obstructive sleep apnea was found to have rapid atrial fibrillation now treated with amiodarone and metoprolol because diltiazem was not effective denies any significant chest pain supplemental oxygen increase his sats to the mid 90s currently on DVT prophylaxis using his CPAP  Past Medical History:  Diagnosis Date  . Allergy   . Anemia   . Arthritis   . Bell's palsy   . CHF (congestive heart failure) ([place])   . CKD (chronic kidney disease)   . Depression   . Diabetes ([place])   . Gastric ulcer   . GERD (gastroesophageal reflux disease)   . Gout   . History of chicken pox   . Hyperlipidemia   . Hypertension   . Hypothyroidism   . OSA (obstructive sleep apnea)   . Thyroid disease   . Vitamin D deficiency     Past Surgical History:  Procedure Laterality Date  . CATARACT EXTRACTION Bilateral 2014 and 2015  . CHOLECYSTECTOMY  04/28/2011   Laproscopic; Dr. Pat Patrick  . GALLBLADDER SURGERY    . LASIK Bilateral 2019   medical  . SHOULDER SURGERY Right 2012   Dr. Leanor Kail    Family History  Problem Relation Age of Onset  . COPD Mother   . Heart disease Father     Social History:  reports that he quit smoking about 40 years ago. His smoking use included cigarettes. He has a 15.00 pack-year smoking history. He has quit using smokeless tobacco. He reports that he does not drink alcohol or use drugs.  Allergies:  Allergies  Allergen Reactions  . Guaifenesin Swelling    Throat swelling, increased heart rate.  . Mucinex   [Guaifenesin Er]     Throat swelling, increases heart rate  . Levaquin [Levofloxacin] Palpitations    Medications: I have reviewed the patient's current medications.  Results for orders placed or performed during the hospital encounter of 03/01/19 (from the past 48 hour(s))  Blood gas, arterial     Status: Abnormal   Collection Time: 03/03/19  4:43 PM  Result Value Ref Range   FIO2 0.60    Delivery systems HI FLOW NASAL CANNULA    pH, Arterial 7.38 7.350 - 7.450   pCO2 arterial 37 32.0 - 48.0 mmHg   pO2, Arterial 66 (L) 83.0 - 108.0 mmHg   Bicarbonate 21.9 20.0 - 28.0 mmol/L   Acid-base deficit 2.8 (H) 0.0 - 2.0 mmol/L   O2 Saturation 92.3 %   Patient temperature 37.0    Collection site RIGHT RADIAL    Sample type ARTERIAL DRAW    Allens test (pass/fail) PASS PASS    Comment: Performed at [facility], [address] [place], [place] [address]  Glucose, capillary     Status: Abnormal   Collection Time: 03/03/19  4:50 PM  Result Value Ref Range   Glucose-Capillary 214 (H) 70 - 99 mg/dL  Glucose, capillary     Status: Abnormal   Collection Time: 03/03/19  8:39 PM  Result Value Ref Range   Glucose-Capillary 165 (H) 70 - 99 mg/dL  CBC with Differential/Platelet     Status: Abnormal   Collection Time:  03/04/19  5:48 AM  Result Value Ref Range   WBC 9.3 4.0 - 10.5 K/uL   RBC 2.89 (L) 4.22 - 5.81 MIL/uL   Hemoglobin 8.5 (L) 13.0 - 17.0 g/dL   HCT 26.5 (L) 39.0 - 52.0 %   MCV 91.7 80.0 - 100.0 fL   MCH 29.4 26.0 - 34.0 pg   MCHC 32.1 30.0 - 36.0 g/dL   RDW 15.9 (H) 11.5 - 15.5 %   Platelets 123 (L) 150 - 400 K/uL   nRBC 0.0 0.0 - 0.2 %   Neutrophils Relative % 84 %   Neutro Abs 7.9 (H) 1.7 - 7.7 K/uL   Lymphocytes Relative 6 %   Lymphs Abs 0.6 (L) 0.7 - 4.0 K/uL   Monocytes Relative 8 %   Monocytes Absolute 0.7 0.1 - 1.0 K/uL   Eosinophils Relative 0 %   Eosinophils Absolute 0.0 0.0 - 0.5 K/uL   Basophils Relative 1 %   Basophils Absolute 0.1 0.0 - 0.1 K/uL    Immature Granulocytes 1 %   Abs Immature Granulocytes 0.08 (H) 0.00 - 0.07 K/uL    Comment: Performed at [facility], [address] [place], [place] [id number]  Basic metabolic panel     Status: Abnormal   Collection Time: 03/04/19  5:48 AM  Result Value Ref Range   Sodium 139 135 - 145 mmol/L   Potassium 4.3 3.5 - 5.1 mmol/L   Chloride 106 98 - 111 mmol/L   CO2 23 22 - 32 mmol/L   Glucose, Bld 144 (H) 70 - 99 mg/dL   BUN 93 (H) 8 - 23 mg/dL   Creatinine, Ser 4.11 (H) 0.61 - 1.24 mg/dL   Calcium 8.3 (L) 8.9 - 10.3 mg/dL   GFR calc non Af Amer 15 (L) >60 mL/min   GFR calc Af Amer 17 (L) >60 mL/min   Anion gap 10 5 - 15    Comment: Performed at [facility], [address] [name], [place] [address]  TSH     Status: None   Collection Time: 03/04/19  5:48 AM  Result Value Ref Range   TSH 2.518 0.350 - 4.500 uIU/mL    Comment: Performed by a 3rd Generation assay with a functional sensitivity of <=0.01 uIU/mL. Performed at [facility], [address] [place], [place] [address]   Magnesium     Status: None   Collection Time: 03/04/19  5:48 AM  Result Value Ref Range   Magnesium 2.0 1.7 - 2.4 mg/dL    Comment: Performed at [facility], [address] [place], [place] [address]  Glucose, capillary     Status: Abnormal   Collection Time: 03/04/19  7:43 AM  Result Value Ref Range   Glucose-Capillary 153 (H) 70 - 99 mg/dL  Glucose, capillary     Status: Abnormal   Collection Time: 03/04/19  9:46 AM  Result Value Ref Range   Glucose-Capillary 174 (H) 70 - 99 mg/dL  MRSA PCR Screening     Status: None   Collection Time: 03/04/19  9:50 AM   Specimen: Nasopharyngeal  Result Value Ref Range   MRSA by PCR NEGATIVE NEGATIVE    Comment:        The GeneXpert MRSA Assay (FDA approved for NASAL specimens only), is one component of a comprehensive MRSA colonization surveillance program. It is not intended to diagnose MRSA infection nor to  guide or monitor treatment for MRSA infections. Performed at [facility], [address] [place], [place] [address]   Glucose, capillary     Status: Abnormal   Collection Time: 03/04/19 11:26 AM  Result Value Ref Range   Glucose-Capillary 182 (H) 70 - 99 mg/dL  APTT     Status: None   Collection Time: 03/04/19 12:00 PM  Result Value Ref Range   aPTT 32 24 - 36 seconds    Comment: Performed at [facility], [address] [place], [place] [address]  Glucose, capillary     Status: Abnormal   Collection Time: 03/04/19  4:15 PM  Result Value Ref Range   Glucose-Capillary 218 (H) 70 - 99 mg/dL  Brain natriuretic peptide     Status: Abnormal   Collection Time: 03/04/19  5:24 PM  Result Value Ref Range   B Natriuretic Peptide 315.0 (H) 0.0 - 100.0 pg/mL    Comment: Performed at [facility], [place], [place] [address]  Troponin I (High Sensitivity)     Status: Abnormal   Collection Time: 03/04/19  5:24 PM  Result Value Ref Range   Troponin I (High Sensitivity) 232 (HH) <18 ng/L    Comment: CRITICAL VALUE NOTED. VALUE IS CONSISTENT WITH PREVIOUSLY REPORTED/CALLED VALUE READ BACK AND VERIFIED WITH MYRA FLOWERS AT 1802 03/04/2019.PMF (NOTE) Elevated high sensitivity troponin I (hsTnI) values and significant  changes across serial measurements may suggest ACS but many other  chronic and acute conditions are known to elevate hsTnI results.  Refer to the "Links" section for chest pain algorithms and additional  guidance. Performed at [facility], [address], [place], [place] [address]   Heparin level (unfractionated)     Status: Abnormal   Collection Time: 03/04/19  8:32 PM  Result Value Ref Range   Heparin Unfractionated <0.10 (L) 0.30 - 0.70 IU/mL    Comment: (NOTE) If heparin results are below expected values, and patient dosage has  been confirmed, suggest follow up testing of antithrombin III levels. Performed at  [facility], [address] [place], [place] [address]   Troponin I (High Sensitivity)     Status: Abnormal   Collection Time: 03/04/19  8:32 PM  Result Value Ref Range   Troponin I (High Sensitivity) 469 (HH) <18 ng/L    Comment: CRITICAL VALUE NOTED. VALUE IS CONSISTENT WITH PREVIOUSLY REPORTED/CALLED VALUE [place] (NOTE) Elevated high sensitivity troponin I (hsTnI) values and significant  changes across serial measurements may suggest ACS but many other  chronic and acute conditions are known to elevate hsTnI results.  Refer to the "Links" section for chest pain algorithms and additional  guidance. Performed at [facility], [address] [place], [place] [address]   Glucose, capillary     Status: Abnormal   Collection Time: 03/04/19  9:47 PM  Result Value Ref Range   Glucose-Capillary 251 (H) 70 - 99 mg/dL  Glucose, capillary     Status: Abnormal   Collection Time: 03/05/19  2:33 AM  Result Value Ref Range   Glucose-Capillary 236 (H) 70 - 99 mg/dL  Phosphorus     Status: Abnormal   Collection Time: 03/05/19  4:19 AM  Result Value Ref Range   Phosphorus 5.2 (H) 2.5 - 4.6 mg/dL    Comment: Performed at [facility], [address] [place],  [address]  CBC     Status: Abnormal   Collection Time: 03/05/19  4:19 AM  Result Value Ref Range   WBC 7.2 4.0 - 10.5 K/uL   RBC  2.80 (L) 4.22 - 5.81 MIL/uL   Hemoglobin 8.1 (L) 13.0 - 17.0 g/dL   HCT 24.8 (L) 39.0 - 52.0 %   MCV 88.6 80.0 - 100.0 fL   MCH 28.9 26.0 - 34.0 pg   MCHC 32.7 30.0 - 36.0 g/dL   RDW 15.3 11.5 - 15.5 %   Platelets 115 (L) 150 - 400 K/uL    Comment: REPEATED TO VERIFY Immature Platelet Fraction may be clinically indicated, consider ordering this additional test [id number]    nRBC 0.0 0.0 - 0.2 %    Comment: Performed at [facility], [address] [place], [place] [address]  Heparin level (unfractionated)     Status: Abnormal   Collection Time: 03/05/19   6:02 AM  Result Value Ref Range   Heparin Unfractionated 0.16 (L) 0.30 - 0.70 IU/mL    Comment: (NOTE) If heparin results are below expected values, and patient dosage has  been confirmed, suggest follow up testing of antithrombin III levels. Performed at [facility], [address] [place], [place] [address]   Comprehensive metabolic panel     Status: Abnormal   Collection Time: 03/05/19  6:02 AM  Result Value Ref Range   Sodium 137 135 - 145 mmol/L   Potassium 4.2 3.5 - 5.1 mmol/L   Chloride 104 98 - 111 mmol/L   CO2 23 22 - 32 mmol/L   Glucose, Bld 215 (H) 70 - 99 mg/dL   BUN 93 (H) 8 - 23 mg/dL   Creatinine, Ser 4.08 (H) 0.61 - 1.24 mg/dL   Calcium 8.5 (L) 8.9 - 10.3 mg/dL   Total Protein 7.1 6.5 - 8.1 g/dL   Albumin 3.3 (L) 3.5 - 5.0 g/dL   AST 13 (L) 15 - 41 U/L   ALT 16 0 - 44 U/L   Alkaline Phosphatase 57 38 - 126 U/L   Total Bilirubin 1.0 0.3 - 1.2 mg/dL   GFR calc non Af Amer 15 (L) >60 mL/min   GFR calc Af Amer 17 (L) >60 mL/min   Anion gap 10 5 - 15    Comment: Performed at [facility], [address] [place], [place] [address]  Magnesium     Status: None   Collection Time: 03/05/19  6:02 AM  Result Value Ref Range   Magnesium 2.1 1.7 - 2.4 mg/dL    Comment: Performed at [facility], [address] [place], [place] [address]  Procalcitonin - Baseline     Status: None   Collection Time: 03/05/19  6:02 AM  Result Value Ref Range   Procalcitonin 0.36 ng/mL    Comment:        Interpretation: PCT (Procalcitonin) <= 0.5 ng/mL: Systemic infection (sepsis) is not likely. Local bacterial infection is possible. (NOTE)       Sepsis PCT Algorithm           Lower Respiratory Tract                                      Infection PCT Algorithm    ----------------------------     ----------------------------         PCT < 0.25 ng/mL                PCT < 0.10 ng/mL         Strongly encourage  Strongly discourage    discontinuation of antibiotics    initiation of antibiotics    ----------------------------     -----------------------------       PCT 0.25 - 0.50 ng/mL            PCT 0.10 - 0.25 ng/mL               OR       >80% decrease in PCT            Discourage initiation of                                            antibiotics      Encourage discontinuation           of antibiotics    ----------------------------     -----------------------------         PCT >= 0.50 ng/mL              PCT 0.26 - 0.50 ng/mL               AND        <80% decrease in PCT             Encourage initiation of                                             antibiotics       Encourage continuation           of antibiotics    ----------------------------     -----------------------------        PCT >= 0.50 ng/mL                  PCT > 0.50 ng/mL               AND         increase in PCT                  Strongly encourage                                      initiation of antibiotics    Strongly encourage escalation           of antibiotics                                     -----------------------------                                           PCT <= 0.25 ng/mL                                                 OR                                        >  80% decrease in PCT                                     Discontinue / Do not initiate                                             antibiotics Performed at [facility], [address] [place], [place] [address]   Glucose, capillary     Status: Abnormal   Collection Time: 03/05/19  7:25 AM  Result Value Ref Range   Glucose-Capillary 201 (H) 70 - 99 mg/dL  Troponin I (High Sensitivity)     Status: Abnormal   Collection Time: 03/05/19 10:17 AM  Result Value Ref Range   Troponin I (High Sensitivity) 525 (HH) <18 ng/L    Comment: CRITICAL RESULT CALLED TO, READ BACK BY AND VERIFIED WITH TAYLOR FOSTER 03/05/2019 1107 [facility] (NOTE) Elevated high sensitivity  troponin I (hsTnI) values and significant  changes across serial measurements may suggest ACS but many other  chronic and acute conditions are known to elevate hsTnI results.  Refer to the "Links" section for chest pain algorithms and additional  guidance. Performed at [facility], [address] [place], [place] [address]   Glucose, capillary     Status: Abnormal   Collection Time: 03/05/19 11:28 AM  Result Value Ref Range   Glucose-Capillary 276 (H) 70 - 99 mg/dL    Dg Chest 2 View  Result Date: 03/03/2019 CLINICAL DATA:  Shortness of breath. Pneumonia. Congestive heart failure and chronic kidney disease. EXAM: CHEST - 2 VIEW COMPARISON:  03/01/2019 FINDINGS: The heart size and mediastinal contours are within normal limits. New asymmetric airspace disease is seen in the right mid and lower lung, suspicious for pneumonia. Left lung remains clear. No evidence of pleural effusion. IMPRESSION: New asymmetric airspace disease in right mid and lower lung, suspicious for pneumonia. Electronically Signed   By: Marlaine Hind M.D.   On: 03/03/2019 17:32   Dg Chest Port 1 View  Result Date: 03/04/2019 CLINICAL DATA:  Respiratory distress EXAM: PORTABLE CHEST 1 VIEW COMPARISON:  March 03, 2019 FINDINGS: Increasing interstitial opacities throughout the left lung. More focal opacities are developing in the right base and right perihilar region. Cardiomegaly. The hila and mediastinum are unchanged. No pneumothorax. No other changes. IMPRESSION: 1. Developing focal infiltrate in the right perihilar region and right base worrisome for an infectious process/pneumonia. Mild increased interstitial markings in the left may represent pulmonary venous congestion. Electronically Signed   By: Dorise Bullion III M.D   On: 03/04/2019 19:06    Review of Systems  Constitutional: Positive for diaphoresis and malaise/fatigue.  HENT: Positive for congestion.   Eyes: Negative.   Respiratory: Positive for  cough, shortness of breath and wheezing.   Cardiovascular: Positive for palpitations and orthopnea.  Gastrointestinal: Negative.   Genitourinary: Negative.   Musculoskeletal: Positive for back pain.  Skin: Negative.   Neurological: Positive for weakness.  Endo/Heme/Allergies: Negative.   Psychiatric/Behavioral: Negative.    Blood pressure (!) 151/71, pulse 68, temperature 98.7 F (37.1 C), resp. rate 18, height 5\' 11"  (1.803 m), weight 135.3 kg, SpO2 96 %. Physical Exam  Nursing note and vitals reviewed. Constitutional: He is oriented to person, place, and time. He appears well-developed and well-nourished.  HENT:  Head: Normocephalic and atraumatic.  Eyes: Pupils are equal, round, and reactive to light. Conjunctivae and EOM are normal.  Neck: Normal range of motion. Neck supple.  Cardiovascular: An irregularly irregular rhythm present. Tachycardia present.  Murmur heard. Respiratory: He is in respiratory distress. He has decreased breath sounds. He has wheezes. He has rhonchi.  GI: Soft. Bowel sounds are normal.  Musculoskeletal: Normal range of motion.  Neurological: He is alert and oriented to person, place, and time. He has normal reflexes.  Skin: Skin is warm and dry.  Psychiatric: He has a normal mood and affect.    Assessment/Plan: Atrial fibrillation rapid ventricular spots Respiratory failure Shortness of breath Obstructive sleep apnea Chronic renal insufficiency stage IV Congestive heart failure Obesity Diabetes type 2 Hypoxemia Acute bronchitis GERD Hyperlipidemia Hypertension Anemia . Plan Agree with ICU level care Continue amiodarone IV load and drip Switch to metoprolol for rate Wean IV diltiazem hopefully to off with just as needed p.o. doses as needed Anticoagulation for atrial fibrillation consider long-term anticoagulation upon discharge Obesity recommend weight loss exercise portion control Continue hypertension control amlodipine beta-blocker  hydralazine Recommend continue broad-spectrum antibiotic therapy Nephrology input for significant renal insufficiency Agree with supplemental oxygen therapy for hypoxemia Continue inhalers and pulmonary critical care follow-up and management DVT prophylaxis  Burnadette Baskett D Wilmetta Speiser 03/05/2019, 1:44 PM

## 2019-03-05 NOTE — Progress Notes (Signed)
Inpatient Diabetes Program Recommendations  AACE/ADA: New Consensus Statement on Inpatient Glycemic Control   Target Ranges:  Prepandial:   less than 140 mg/dL      Peak postprandial:   less than 180 mg/dL (1-2 hours)      Critically ill patients:  140 - 180 mg/dL   Results for Patrick Hurst, Patrick Hurst (MRN [id number]) as of 03/05/2019 13:50  Ref. Range 03/04/2019 07:43 03/04/2019 09:46 03/04/2019 11:26 03/04/2019 16:15 03/04/2019 21:47 03/05/2019 02:33 03/05/2019 07:25 03/05/2019 11:28  Glucose-Capillary Latest Ref Range: 70 - 99 mg/dL 153 (H)  Novolog 3 units 174 (H) 182 (H)  Novolog 3 units 218 (H)  Novolog 8 units 251 (H)    U500 15 units 236 (H) 201 (H)  Novolog 5 units  U500 15 units 276 (H)  Results for [name], [name] (MRN [id number]) as of 03/05/2019 13:50  Ref. Range 03/03/2019 07:49 03/03/2019 11:42 03/03/2019 16:50 03/03/2019 20:39  Glucose-Capillary Latest Ref Range: 70 - 99 mg/dL 186 (H)  Novolog 3 units  U500 25 units 181 (H)  Novolog 3 units 214 (H)  Novolog 5 units  U500 25 units 165 (H)   Review of Glycemic Control  Diabetes history: DM2 Outpatient Diabetes medications: Humulin R U500 25 units BID Current orders for Inpatient glycemic control: Humulin R U500 15 units BID, Novolog 0-15 units TID with meals, Novolog 0-5 units QHS  Inpatient Diabetes Program Recommendations:   Insulin-Basal: Noted morning dose of Humulin R U500 was NOT GIVEN on 03/04/19 but evening dose of Humulin R U500 15 units was given at 22:07. Recommend increasing Humulin R U500 to 20 units BID with meals (to be given with breakfast and supper) if patient eats at least 50% of meals.  Thanks, Barnie Alderman, RN, MSN, CDE Diabetes Coordinator Inpatient Diabetes Program [phone] (Team Pager from 8am to 5pm)

## 2019-03-05 NOTE — Progress Notes (Signed)
[facility], [place] 03/05/19  Subjective:  Patient seen at bedside in the critical care unit. States that he is feeling a bit better as compared to admission. Still on high flow nasal cannula at the moment however.   Objective:  Vital signs in last 24 hours:  Temp:  [98.4 F (36.9 C)-99 F (37.2 C)] 98.7 F (37.1 C) (11/02 0800) Pulse Rate:  [57-145] 68 (11/02 1324) Resp:  [14-34] 18 (11/02 1324) BP: (116-175)/(61-135) 151/71 (11/02 0957) SpO2:  [90 %-100 %] 96 % (11/02 1324) FiO2 (%):  [44 %-90 %] 44 % (11/02 1324)  Weight change:  Filed Weights   03/01/19 1725 03/04/19 0947  Weight: 133.4 kg 135.3 kg    Intake/Output:    Intake/Output Summary (Last 24 hours) at 03/05/2019 1351 Last data filed at 03/05/2019 1300 Gross per 24 hour  Intake 2293.93 ml  Output 4050 ml  Net -1756.07 ml    Gen:   No acute distress Head:   Normocephalic, atraumatic Eyes/ENT:  Conjunctiva clear,  moist oral mucus membranes Neck:  Supple Lungs:   Coarse bilaterally, high flow nasal cannula on Heart:   Irregular  Abdomen:   Soft, non-tender, bowel sounds active  Extremities: 1+ bilateral lower extremity edema Skin:  Skin warm, turgor normal, no rashes or lesions Neurologic: Alert and oriented, able to answer questions appropriately     Basic Metabolic Panel:  Recent Labs  Lab 03/01/19 1733 03/02/19 0017 03/03/19 0602 03/04/19 0548 03/05/19 0419 03/05/19 0602  NA 136  --  141 139  --  137  K 4.1  --  4.8 4.3  --  4.2  CL 99  --  105 106  --  104  CO2 25  --  22 23  --  23  GLUCOSE 217*  --  208* 144*  --  215*  BUN 78*  --  89* 93*  --  93*  CREATININE 4.43* 4.30* 4.40* 4.11*  --  4.08*  CALCIUM 8.1*  --  8.4* 8.3*  --  8.5*  MG  --   --   --  2.0  --  2.1  PHOS  --   --   --   --  5.2*  --      CBC: Recent Labs  Lab 03/01/19 1733 03/02/19 0017 03/02/19 0607 03/04/19 0548 03/05/19 0419  WBC 7.6 8.4 7.9 9.3 7.2  NEUTROABS  --   --    --  7.9*  --   HGB 8.7* 8.8* 9.0* 8.5* 8.1*  HCT 26.0* 27.0* 28.0* 26.5* 24.8*  MCV 89.0 90.0 91.2 91.7 88.6  PLT 128* 135* 137* 123* 115*      Lab Results  Component Value Date   HEPBSAG Negative 11/20/2017   HEPBSAB Non Reactive 11/20/2017   HEPBIGM Negative 11/20/2017      Microbiology:  Recent Results (from the past 240 hour(s))  SARS CORONAVIRUS 2 (TAT 6-24 HRS) Nasopharyngeal Nasopharyngeal Swab     Status: None   Collection Time: 03/01/19  7:27 PM   Specimen: Nasopharyngeal Swab  Result Value Ref Range Status   SARS Coronavirus 2 NEGATIVE NEGATIVE Final    Comment: (NOTE) SARS-CoV-2 target nucleic acids are NOT DETECTED. The SARS-CoV-2 RNA is generally detectable in upper and lower respiratory specimens during the acute phase of infection. Negative results do not preclude SARS-CoV-2 infection, do not rule out co-infections with other pathogens, and should not be used as the sole basis for treatment or other patient management  decisions. Negative results must be combined with clinical observations, patient history, and epidemiological information. The expected result is Negative. Fact Sheet for Patients: SugarRoll.be Fact Sheet for Healthcare Providers: https://www.woods-mathews.com/ This test is not yet approved or cleared by the [place] FDA and  has been authorized for detection and/or diagnosis of SARS-CoV-2 by FDA under an Emergency Use Authorization (EUA). This EUA will remain  in effect (meaning this test can be used) for the duration of the COVID-19 declaration under Section 56 4(b)(1) of the Act, 21 U.S.C. section 360bbb-3(b)(1), unless the authorization is terminated or revoked sooner. Performed at [facility], [place] [address], [place], [place] [address]   MRSA PCR Screening     Status: None   Collection Time: 03/04/19  9:50 AM   Specimen: Nasopharyngeal  Result Value Ref Range Status   MRSA by  PCR NEGATIVE NEGATIVE Final    Comment:        The GeneXpert MRSA Assay (FDA approved for NASAL specimens only), is one component of a comprehensive MRSA colonization surveillance program. It is not intended to diagnose MRSA infection nor to guide or monitor treatment for MRSA infections. Performed at [facility], [address] [place], [place] [address]     Coagulation Studies: No results for input(s): LABPROT, INR in the last 72 hours.  Urinalysis: No results for input(s): COLORURINE, LABSPEC, PHURINE, GLUCOSEU, HGBUR, BILIRUBINUR, KETONESUR, PROTEINUR, UROBILINOGEN, NITRITE, LEUKOCYTESUR in the last 72 hours.  Invalid input(s): APPERANCEUR    Imaging: Dg Chest 2 View  Result Date: 03/03/2019 CLINICAL DATA:  Shortness of breath. Pneumonia. Congestive heart failure and chronic kidney disease. EXAM: CHEST - 2 VIEW COMPARISON:  03/01/2019 FINDINGS: The heart size and mediastinal contours are within normal limits. New asymmetric airspace disease is seen in the right mid and lower lung, suspicious for pneumonia. Left lung remains clear. No evidence of pleural effusion. IMPRESSION: New asymmetric airspace disease in right mid and lower lung, suspicious for pneumonia. Electronically Signed   By: Marlaine Hind M.D.   On: 03/03/2019 17:32   Dg Chest Port 1 View  Result Date: 03/04/2019 CLINICAL DATA:  Respiratory distress EXAM: PORTABLE CHEST 1 VIEW COMPARISON:  March 03, 2019 FINDINGS: Increasing interstitial opacities throughout the left lung. More focal opacities are developing in the right base and right perihilar region. Cardiomegaly. The hila and mediastinum are unchanged. No pneumothorax. No other changes. IMPRESSION: 1. Developing focal infiltrate in the right perihilar region and right base worrisome for an infectious process/pneumonia. Mild increased interstitial markings in the left may represent pulmonary venous congestion. Electronically Signed   By: Dorise Bullion III M.D   On: 03/04/2019 19:06     Medications:   . sodium chloride 250 mL (03/05/19 0850)  . amiodarone 30 mg/hr (03/05/19 0954)  . cefTRIAXone (ROCEPHIN)  IV 1 g (03/05/19 0912)   . amitriptyline  25 mg Oral QHS  . atorvastatin  40 mg Oral Daily  . azithromycin  500 mg Oral Daily  . calcitRIOL  0.5 mcg Oral Daily  . Chlorhexidine Gluconate Cloth  6 each Topical Daily  . enoxaparin (LOVENOX) injection  40 mg Subcutaneous Daily  . fluticasone furoate-vilanterol  1 puff Inhalation Daily  . furosemide  40 mg Intravenous BID  . hydrALAZINE  25 mg Oral TID  . insulin aspart  0-15 Units Subcutaneous TID WC  . insulin aspart  0-5 Units Subcutaneous QHS  . insulin regular human CONCENTRATED  15 Units Subcutaneous BID WC  .  ipratropium-albuterol  3 mL Nebulization Q6H  . levothyroxine  100 mcg Oral Daily  . loratadine  10 mg Oral Daily  . mouth rinse  15 mL Mouth Rinse BID  . metoprolol tartrate  25 mg Oral BID  . pantoprazole  40 mg Oral BID  . pneumococcal 23 valent vaccine  0.5 mL Intramuscular Tomorrow-1000  . senna  1 tablet Oral BID  . 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 CKD st 5.  Serum creatinine trends are summarized below Recent Labs    03/02/19 0017 03/03/19 0602 03/04/19 0548 03/05/19 0602  CREATININE 4.30* 4.40* 4.11* 4.08*  -Patient continues to have considerable renal dysfunction however no urgent indication for dialysis at the moment.  We will continue to monitor renal parameters closely.  #. Anemia of CKD  Lab Results  Component Value Date   HGB 8.1 (L) 03/05/2019  Patient will likely require Epogen as an outpatient.  #. SHPTH  No results found for: PTH Lab Results  Component Value Date   PHOS 5.2 (H) 03/05/2019  Continue to monitor bone mineral metabolism parameters.  #. HTN with CKD Maintain the patient on hydralazine as well as metoprolol.  #. Diabetes type 2 with CKD Hemoglobin A1C (%)  Date Value  03/14/2012 5.0   Hgb A1c MFr Bld (%)  Date Value  03/02/2019 6.1 (H)  Insulin-dependent Management as per hospitalist team  #Shortness of breath, multifocal pneumonia -Currently getting treatment with azithromycin and ceftriaxone   #Atrial fibrillation with rapid ventricular response -Transferred to ICU for IV Cardizem   LOS: 4 Jayla Mackie 11/2/20201:51 PM  [employer], [place]

## 2019-03-05 NOTE — Progress Notes (Signed)
Home unit checked for use. Wires and unit in tact and appears to be in good working condition. [facility] notified to come check out CPAP in am.

## 2019-03-06 ENCOUNTER — Inpatient Hospital Stay
Admit: 2019-03-06 | Discharge: 2019-03-06 | Disposition: A | Discharge status: Home / Self Care | Payer: Commercial Managed Care - PPO | Attending: Family Medicine | Admitting: Family Medicine

## 2019-03-06 LAB — ECHOCARDIOGRAM COMPLETE
Height: 71 in
Weight: 4772.52 oz

## 2019-03-06 LAB — GLUCOSE, CAPILLARY
Glucose-Capillary: 198 mg/dL — ABNORMAL HIGH (ref 70–99)
Glucose-Capillary: 295 mg/dL — ABNORMAL HIGH (ref 70–99)
Glucose-Capillary: 234 mg/dL — ABNORMAL HIGH (ref 70–99)
Glucose-Capillary: 215 mg/dL — ABNORMAL HIGH (ref 70–99)

## 2019-03-06 LAB — BASIC METABOLIC PANEL
Anion gap: 11 (ref 5–15)
BUN: 90 mg/dL — ABNORMAL HIGH (ref 8–23)
CO2: 26 mmol/L (ref 22–32)
Calcium: 9 mg/dL (ref 8.9–10.3)
Chloride: 103 mmol/L (ref 98–111)
Creatinine, Ser: 4.03 mg/dL — ABNORMAL HIGH (ref 0.61–1.24)
GFR calc Af Amer: 17 mL/min — ABNORMAL LOW (ref >60)
GFR calc non Af Amer: 15 mL/min — ABNORMAL LOW (ref >60)
Glucose, Bld: 192 mg/dL — ABNORMAL HIGH (ref 70–99)
Potassium: 4.5 mmol/L (ref 3.5–5.1)
Sodium: 140 mmol/L (ref 135–145)

## 2019-03-06 LAB — MAGNESIUM: Magnesium: 2.1 mg/dL (ref 1.7–2.4)

## 2019-03-06 MED ORDER — INSULIN REGULAR HUMAN (CONC) 500 UNIT/ML ~~LOC~~ SOPN
25.0000 [IU] | PEN_INJECTOR | Freq: Two times a day (BID) | SUBCUTANEOUS | Status: DC
  Filled 2019-03-06: qty 3

## 2019-03-06 MED ORDER — FUROSEMIDE 10 MG/ML IJ SOLN
80.0000 mg | Freq: Two times a day (BID) | INTRAMUSCULAR | Status: DC
  Administered 2019-03-06 – 2019-03-08 (×4): 80 mg via INTRAVENOUS
  Filled 2019-03-06 (×4): qty 8

## 2019-03-06 MED ORDER — FUROSEMIDE 10 MG/ML IJ SOLN
40.0000 mg | Freq: Once | INTRAMUSCULAR | Status: AC
  Administered 2019-03-06: 12:00:00 40 mg via INTRAVENOUS
  Filled 2019-03-06: qty 4

## 2019-03-06 MED ORDER — POLYETHYLENE GLYCOL 3350 17 G PO PACK
17.0000 g | PACK | Freq: Every day | ORAL | Status: DC
  Administered 2019-03-07 – 2019-03-08 (×2): 17 g via ORAL
  Filled 2019-03-06 (×4): qty 1

## 2019-03-06 MED ORDER — IPRATROPIUM-ALBUTEROL 0.5-2.5 (3) MG/3ML IN SOLN
3.0000 mL | Freq: Three times a day (TID) | RESPIRATORY_TRACT | Status: DC
  Administered 2019-03-06 – 2019-03-09 (×8): 3 mL via RESPIRATORY_TRACT
  Filled 2019-03-06 (×9): qty 3

## 2019-03-06 MED ORDER — CHLORHEXIDINE GLUCONATE 0.12 % MT SOLN
OROMUCOSAL | Status: AC
  Administered 2019-03-06: 15 mL
  Filled 2019-03-06: qty 15

## 2019-03-06 NOTE — Progress Notes (Addendum)
Nutrition Brief Note  RD received consult for Assessment of nutrition requirements/status  Wt Readings from Last 15 Encounters:  03/04/19 135.3 kg  12/11/18 (!) 138.9 kg  12/08/18 (!) 136.6 kg  08/16/18 129.8 kg  08/10/18 133 kg  07/24/18 136.1 kg  07/03/18 132 kg  07/03/18 132.8 kg  06/28/18 131.1 kg  06/26/18 130.9 kg  06/09/18 (!) 141.1 kg  05/23/18 (!) 137.4 kg  03/20/18 (!) 138.3 kg  02/20/18 (!) 142.4 kg  11/19/17 66.88 kg    63 year old male with PMHx of DM, gout, GERD, vitamin D deficiency, HLD, depression, OSA, Bell's palsy, HTN, hypothyroidism, arthritis, CHF, CKD who is admitted with acute hypoxic respiratory failure secondary to multifocal PNA, acute on chronic CHF, A-fib with RVR, CKD stage V.  Met with patient and his wife at bedside. RN also present in the room for most of assessment. Patient reports his appetite and intake are good now and at baseline. He is eating 100% of his meals. He reports he is weight-stable except for occasional weight gain from fluid. No subcutaneous fat or muscle wasting on nutrition-focused physical exam. Patient does not meet criteria for malnutrition at this time. He is struggling with the 1.2 L fluid restriction because he typically drinks a lot of fluids at home but he reports he understands why he needs to follow it. He reports at home he drinks >8 cups of fluid per day. He reports he has never been previously told he needs to restrict his fluid. Encouraged patient to ask doctor prior to discharge what his fluid restriction should be at home in setting of CKD stage V and CHF. He reports he follows a carbohydrate modified diet in setting of diabetes and feels very comfortable with management of this. He has good control with last A1c of 6.1 on 03/02/2019. He also follows a low sodium diet in setting of CHF and CKD. Patient has no nutrition questions and no education needs identified at this time. Not appropriate to address weight loss in acute  care setting. Consider referral for outpatient nutrition education/counseling by outpatient RD.  Body mass index is 41.6 kg/m. Patient meets criteria for obesity class III based on current BMI.   Current diet order is renal/carbohydrate modified with 1.2 L fluid restriction, patient is consuming approximately 100% of meals at this time. Labs and medications reviewed.   No nutrition interventions warranted at this time. If nutrition issues arise, please consult RD.   Jacklynn Barnacle, MS, RD, LDN Office: [phone] Pager: [phone] After Hours/Weekend Pager: [phone]

## 2019-03-06 NOTE — Progress Notes (Signed)
Pharmacy Electrolyte Monitoring Consult:  TM is a 11 YOM admitted on 03/01/2019 with shortness of breath and cough. PMH includes: CKD stage IV, CHF, DM, obstructive sleep apnea on CPAP.  He is being treated for multifocal pneumonia in the ICU.  Patient presented in atrial fibrillation with RVR, which resolved within 48 hours.   Pharmacy has been consulted to assist in monitoring and replacing electrolytes.  Labs:  Sodium (mmol/L)  Date Value  03/06/2019 140  04/04/2018 145 (H)  05/04/2013 137   Potassium (mmol/L)  Date Value  03/06/2019 4.5  05/04/2013 4.4   Magnesium (mg/dL)  Date Value  03/06/2019 2.1  03/14/2012 1.9   Phosphorus (mg/dL)  Date Value  03/05/2019 5.2 (H)   Calcium (mg/dL)  Date Value  03/06/2019 9.0   Calcium, Total (mg/dL)  Date Value  05/04/2013 9.1   Albumin (g/dL)  Date Value  03/05/2019 3.3 (L)  04/04/2018 4.3  05/04/2013 3.3 (L)   Corrected Calcium: 9.6 mg/dL  Assessment/Plan:  Electrolytes:  - no electrolyte replacement warranted at this time. - will check labs in AM (including phosphorous)  Goals: Mg ~ 2 K ~ 4  Glucose: - 11/3: Blood glucose still uncontrolled with values in the upper 200s; will transition patient to 25 units BID of the U500 insulin. - Will continue to monitor blood glucose readings and consult the diabetes team for input as needed.  Thank you for allowing pharmacy to be a part of this patient's care.  Raiford Simmonds, PharmD Candidate 03/06/2019 1:15 PM

## 2019-03-06 NOTE — Progress Notes (Signed)
PULMONARY / CRITICAL CARE MEDICINE  Name: Patrick Hurst MRN: [id number] DOB: Jul 18, 1955    LOS: 5  Referring Provider:  Dr [name] Reason for Referral: Acute hypoxic respiratory failure  Brief patient description: 64 Y/O male with OSA/OHS  Admitted with acute hypoxic respiratory failure, acute on chronic renal failure, and acute pulmonary edema   HPI: This is a 63 year old male with a medical history as indicated below who presented to the ED with worsening dyspnea and cough.  His chest x-ray showed diffuse infiltrates that was assumed to be multifocal pneumonia.  He was also found to be in acute on chronic renal failure stage IV with a creatinine level greater than 4 up from his baseline of 3.4.  He was started on Rocephin and Zithromax and placed on supplemental oxygen and admitted to the floor.  Today he developed A. fib with RVR with heart rate in the 150s and increasing oxygen needs and hence was transferred to the ICU for IV diltiazem, IV amiodarone and continuous BiPAP.  Repeat chest x-ray today also showed diffuse pulmonary edema. He is currently on high flow nasal cannula and his SPO2 stable in the high 90s.  His heart rate continues to fluctuate between 110 and 140 bpm on diltiazem and amiodarone infusions.  He reports improved dyspnea.  SIGNIFICANT EVENTS: 03/01/2019: Admitted 03/04/2019: Transferred to the ICU for worsening oxygen needs an elevated heart rate 11/2 -HHFNC down to 60%, bc today, stopping heparin gtt will transition to DVT ppx dose.  11/3 - slow to diurese, will increase lasix to 80 bid, BIPAP during day , restricted fluid 1200cc/day   Past Medical History:  Diagnosis Date  . Allergy   . Anemia   . Arthritis   . Bell's palsy   . CHF (congestive heart failure) ([place])   . CKD (chronic kidney disease)   . Depression   . Diabetes ([place])   . Gastric ulcer   . GERD (gastroesophageal reflux disease)   . Gout   . History of chicken pox   . Hyperlipidemia    . Hypertension   . Hypothyroidism   . OSA (obstructive sleep apnea)   . Thyroid disease   . Vitamin D deficiency    Past Surgical History:  Procedure Laterality Date  . CATARACT EXTRACTION Bilateral 2014 and 2015  . CHOLECYSTECTOMY  04/28/2011   Laproscopic; Dr. Pat Patrick  . GALLBLADDER SURGERY    . LASIK Bilateral 2019   medical  . SHOULDER SURGERY Right 2012   Dr. Leanor Kail   No current facility-administered medications on file prior to encounter.    Current Outpatient Medications on File Prior to Encounter  Medication Sig  . albuterol (VENTOLIN HFA) 108 (90 Base) MCG/ACT inhaler USE 2 INHALATIONS EVERY 4 HOURS AS NEEDED FOR WHEEZING OR SHORTNESS OF BREATH  . amitriptyline (ELAVIL) 25 MG tablet Take 1 tablet (25 mg total) by mouth at bedtime.  Marland Kitchen amLODipine (NORVASC) 10 MG tablet TAKE 1 TABLET DAILY  . Ascorbic Acid (VITAMIN C) 1000 MG tablet Take 1,000 mg by mouth 2 (two) times daily.   Marland Kitchen aspirin 81 MG tablet Take 81 mg by mouth daily.  Marland Kitchen atorvastatin (LIPITOR) 40 MG tablet Take 1 tablet (40 mg total) by mouth daily.  . calcitRIOL (ROCALTROL) 0.5 MCG capsule Take 0.5 mcg by mouth daily.  . cetirizine (ZYRTEC) 10 MG tablet Take 10 mg by mouth daily.   . fluticasone furoate-vilanterol (BREO ELLIPTA) 100-25 MCG/INH AEPB Inhale 1 puff into the lungs daily.  Marland Kitchen  hydrALAZINE (APRESOLINE) 25 MG tablet TAKE 1 TABLET THREE TIMES A DAY  . insulin regular human CONCENTRATED (HUMULIN R) 500 UNIT/ML injection Inject up to 25 units twice a day, or as directed by physician  . ipratropium-albuterol (DUONEB) 0.5-2.5 (3) MG/3ML SOLN INHALE 3 ML BY NEBULIZATION EVERY 4 HOURS AS NEEDED  . levothyroxine (SYNTHROID) 100 MCG tablet Take 1 tablet (100 mcg total) by mouth daily.  . metoprolol tartrate (LOPRESSOR) 50 MG tablet TAKE 1 TABLET TWICE A DAY  . pantoprazole (PROTONIX) 40 MG tablet TAKE 1 TABLET TWICE A DAY  . torsemide (DEMADEX) 20 MG tablet Take 2 tablets (40 mg total) by mouth daily.  Marland Kitchen  ULORIC 80 MG TABS Take 80 mg by mouth daily.   . vitamin B-12 (CYANOCOBALAMIN) 1000 MCG tablet Take 1,000 mcg by mouth daily.  . Misc. Devices (PULSE OXIMETER FOR FINGER) MISC 1 Device by Does not apply route daily as needed.  . tadalafil (CIALIS) 10 MG tablet TAKE ONE TABLET BY MOUTH DAILY AS NEEDED (Patient not taking: Reported on 03/02/2019)  . testosterone cypionate (DEPO-TESTOSTERONE) 200 MG/ML injection Inject 1 mL (200 mg total) into the muscle every 14 (fourteen) days.    Allergies Allergies  Allergen Reactions  . Guaifenesin Swelling    Throat swelling, increased heart rate.  . Mucinex  [Guaifenesin Er]     Throat swelling, increases heart rate  . Levaquin [Levofloxacin] Palpitations    Family History Family History  Problem Relation Age of Onset  . COPD Mother   . Heart disease Father    Social History  reports that he quit smoking about 40 years ago. His smoking use included cigarettes. He has a 15.00 pack-year smoking history. He has quit using smokeless tobacco. He reports that he does not drink alcohol or use drugs.  Review Of Systems:   Constitutional: Negative for fever and chills but positive for malaise.  HENT: Negative for congestion and rhinorrhea.  Eyes: Negative for redness and visual disturbance.  Respiratory: Positive for shortness of breath and cough but negative for wheezing Cardiovascular: Negative for chest pain and palpitations but positive for orthopnea.  Gastrointestinal: Negative  for nausea , vomiting and abdominal pain and  Loose stools Genitourinary: Negative for dysuria and urgency.  Endocrine: Denies polyuria, polyphagia and heat intolerance Musculoskeletal: Negative for myalgias and arthralgias.  Skin: Negative for pallor and wound.  Neurological: Negative for dizziness and headaches   VITAL SIGNS: BP (!) 154/88   Pulse 80   Temp 98.1 F (36.7 C)   Resp 20   Ht 5\' 11"  (1.803 m)   Wt 135.3 kg   SpO2 93%   BMI 41.60 kg/m    HEMODYNAMICS:    VENTILATOR SETTINGS: FiO2 (%):  [34 %-50 %] 50 %  INTAKE / OUTPUT: I/O last 3 completed shifts: In: 2812 [P.O.:1080; I.V.:1532; IV Piggyback:200] Out: 6900 [Urine:6900]  PHYSICAL EXAMINATION: General: Well-groomed, well-nourished, in mild respiratory distress HEENT: PERRLA, trachea midline, no JVD Neuro: Alert and oriented x4, no focal deficit Cardiovascular: Apical pulse irregular, tachycardic at 122 bpm, S1-S2, no murmur regurg or gallop, +2 pulses bilaterally, +2 edema bilateral Lungs: Bilateral breath sounds, diminished in the bases with bibasilar crackles Abdomen: Obese, normal bowel sounds in all 4 quadrants, palpation reveals no organomegaly Musculoskeletal: Positive range of motion, no joint deformities Skin: Warm and dry  LABS:  BMET Recent Labs  Lab 03/04/19 0548 03/05/19 0602 03/06/19 0337  NA 139 137 140  K 4.3 4.2 4.5  CL 106 104 103  CO2 23 23 26   BUN 93* 93* 90*  CREATININE 4.11* 4.08* 4.03*  GLUCOSE 144* 215* 192*    Electrolytes Recent Labs  Lab 03/04/19 0548 03/05/19 0419 03/05/19 0602 03/06/19 0337  CALCIUM 8.3*  --  8.5* 9.0  MG 2.0  --  2.1 2.1  PHOS  --  5.2*  --   --     CBC Recent Labs  Lab 03/02/19 0607 03/04/19 0548 03/05/19 0419  WBC 7.9 9.3 7.2  HGB 9.0* 8.5* 8.1*  HCT 28.0* 26.5* 24.8*  PLT 137* 123* 115*    Coag's Recent Labs  Lab 03/01/19 1927 03/04/19 1200  APTT 34 32  INR 1.0  --     Sepsis Markers Recent Labs  Lab 03/01/19 1733 03/05/19 0602  PROCALCITON 0.26 0.36    ABG Recent Labs  Lab 03/03/19 1643  PHART 7.38  PCO2ART 37  PO2ART 66*    Liver Enzymes Recent Labs  Lab 03/05/19 0602  AST 13*  ALT 16  ALKPHOS 57  BILITOT 1.0  ALBUMIN 3.3*    Cardiac Enzymes No results for input(s): TROPONINI, PROBNP in the last 168 hours.  Glucose Recent Labs  Lab 03/05/19 0725 03/05/19 1128 03/05/19 1639 03/05/19 1949 03/05/19 2138 03/06/19 0735  GLUCAP 201* 276* 243*  202* 208* 198*    Imaging No results found.       STUDIES:  2D echo pending  CULTURES: COVID-19 negative MRSA screen negative  ANTIBIOTICS: Azithromycin 03/01/2019> Ceftriaxone 03/01/2019>    LINES/TUBES: Peripheral IVs    DISCUSSION: 63 year old male with a history of obstructive sleep apnea, obesity hypoventilation syndrome, on home CPAP, CKD stage IV and CHF now presenting with new onset A. fib with RVR, acute CHF exacerbation, acute pulmonary edema and possible pneumonia  ASSESSMENT / PLAN:  PULMONARY A: Acute hypoxic respiratory failure Community-acquired pneumonia Acute pulmonary edema Obstructive sleep apnea/obesity hypoventilation syndrome P:   Starting BIPAP today Chest x-ray and ABG as needed Inhaled steroids and bronchodilators Influenza vaccine if patient agrees  CARDIOVASCULAR A:  New onset A. fib with RVR Acute CHF exacerbation P:  IV diuresis 2D echo Cardiology following Rate controlled  RENAL A:   Acute on chronic renal failure stage V P:   Renal indicis continue to worsen hence patient may require dialysis Nephrology following Continue to monitor renal indices and avoid nephrotoxic drugs Monitor and correct electrolytes   HEMATOLOGIC A:   Chronic thrombocytopenia-platelet count down to 115 but history levels usually fluctuate between 110 and 135. P:  No acute bleeding.  Continue to monitor and transfuse as needed  INFECTIOUS A:   Community-acquired pneumonia P:   Antibiotics as above Fever has resolved; no cultures pending  ENDOCRINE A:   Type 2 diabetes with hyperglycemia Hypothyroidism P:   Resume Humulin U5 100 at 15 units twice daily and hold for meal consumption less than 50% Continue blood glucose monitoring every 4 hours Continue current dose of Synthroid   Best Practice: Code Status: Full code Diet: Heart healthy/carb modified diet GI prophylaxis: Protonix 40 mg twice a day VTE prophylaxis: Already  on full-strength heparin infusion  FAMILY  - Updates: Patient has decision-making capacity.  Already updated on current treatment plan   Critical care provider statement:    Critical care time (minutes):  32   Critical care time was exclusive of:  Separately billable procedures and  treating other patients   Critical care was necessary to treat or prevent imminent or  life-threatening deterioration of the following conditions:  Acute hypoxemic repiratory failure, community aquired pneumonia, AFrvr, OSA, mobid obesity, multiple comorbid conditions.    Critical care was time spent personally by me on the following  activities:  Development of treatment plan with patient or surrogate,  discussions with consultants, evaluation of patient's response to  treatment, examination of patient, obtaining history from patient or  surrogate, ordering and performing treatments and interventions, ordering  and review of laboratory studies and re-evaluation of patient's condition   I assumed direction of critical care for this patient from another  provider in my specialty: no      Ottie Glazier, M.D.  Pulmonary & Critical Care Medicine  [facility]    NB: This document was prepared using Dragon voice recognition software and may include unintentional dictation errors.    03/06/2019, 10:24 AM

## 2019-03-06 NOTE — Progress Notes (Signed)
[facility], [place] 03/06/19  Subjective:  Patient still on high flow nasal cannula this a.m. Overnight was on BiPAP. Renal function appears to be stable with a creatinine of 4.03 and EGFR 15. Cardiology following on the case as well.  Objective:  Vital signs in last 24 hours:  Temp:  [98.1 F (36.7 C)-98.5 F (36.9 C)] 98.1 F (36.7 C) (11/03 0900) Pulse Rate:  [67-120] 80 (11/03 1000) Resp:  [13-23] 20 (11/03 1000) BP: (126-175)/(53-94) 154/88 (11/03 1000) SpO2:  [87 %-97 %] 93 % (11/03 1000) FiO2 (%):  [34 %-50 %] 50 % (11/03 0400)  Weight change:  Filed Weights   03/01/19 1725 03/04/19 0947  Weight: 133.4 kg 135.3 kg    Intake/Output:    Intake/Output Summary (Last 24 hours) at 03/06/2019 1114 Last data filed at 03/06/2019 0800 Gross per 24 hour  Intake 758.07 ml  Output 3600 ml  Net -2841.93 ml    Gen:   No acute distress Head:   Normocephalic, atraumatic Eyes/ENT:  Conjunctiva clear,  moist oral mucus membranes Neck:  Supple Lungs:   Scattered rhonchi, high flow nasal cannula on Heart:   Irregular  Abdomen:   Soft, non-tender, bowel sounds active  Extremities: 1+ bilateral lower extremity edema Skin:  Skin warm, turgor normal, no rashes or lesions Neurologic: Alert and oriented, able to answer questions appropriately     Basic Metabolic Panel:  Recent Labs  Lab 03/01/19 1733 03/02/19 0017 03/03/19 0602 03/04/19 0548 03/05/19 0419 03/05/19 0602 03/06/19 0337  NA 136  --  141 139  --  137 140  K 4.1  --  4.8 4.3  --  4.2 4.5  CL 99  --  105 106  --  104 103  CO2 25  --  22 23  --  23 26  GLUCOSE 217*  --  208* 144*  --  215* 192*  BUN 78*  --  89* 93*  --  93* 90*  CREATININE 4.43* 4.30* 4.40* 4.11*  --  4.08* 4.03*  CALCIUM 8.1*  --  8.4* 8.3*  --  8.5* 9.0  MG  --   --   --  2.0  --  2.1 2.1  PHOS  --   --   --   --  5.2*  --   --      CBC: Recent Labs  Lab 03/01/19 1733 03/02/19 0017 03/02/19 0607  03/04/19 0548 03/05/19 0419  WBC 7.6 8.4 7.9 9.3 7.2  NEUTROABS  --   --   --  7.9*  --   HGB 8.7* 8.8* 9.0* 8.5* 8.1*  HCT 26.0* 27.0* 28.0* 26.5* 24.8*  MCV 89.0 90.0 91.2 91.7 88.6  PLT 128* 135* 137* 123* 115*      Lab Results  Component Value Date   HEPBSAG Negative 11/20/2017   HEPBSAB Non Reactive 11/20/2017   HEPBIGM Negative 11/20/2017      Microbiology:  Recent Results (from the past 240 hour(s))  SARS CORONAVIRUS 2 (TAT 6-24 HRS) Nasopharyngeal Nasopharyngeal Swab     Status: None   Collection Time: 03/01/19  7:27 PM   Specimen: Nasopharyngeal Swab  Result Value Ref Range Status   SARS Coronavirus 2 NEGATIVE NEGATIVE Final    Comment: (NOTE) SARS-CoV-2 target nucleic acids are NOT DETECTED. The SARS-CoV-2 RNA is generally detectable in upper and lower respiratory specimens during the acute phase of infection. Negative results do not preclude SARS-CoV-2 infection, do not rule out co-infections with other  pathogens, and should not be used as the sole basis for treatment or other patient management decisions. Negative results must be combined with clinical observations, patient history, and epidemiological information. The expected result is Negative. Fact Sheet for Patients: SugarRoll.be Fact Sheet for Healthcare Providers: https://www.woods-mathews.com/ This test is not yet approved or cleared by the [place] FDA and  has been authorized for detection and/or diagnosis of SARS-CoV-2 by FDA under an Emergency Use Authorization (EUA). This EUA will remain  in effect (meaning this test can be used) for the duration of the COVID-19 declaration under Section 56 4(b)(1) of the Act, 21 U.S.C. section 360bbb-3(b)(1), unless the authorization is terminated or revoked sooner. Performed at [facility], [place] [address] [place], [place] [address]   MRSA PCR Screening     Status: None   Collection Time: 03/04/19   9:50 AM   Specimen: Nasopharyngeal  Result Value Ref Range Status   MRSA by PCR NEGATIVE NEGATIVE Final    Comment:        The GeneXpert MRSA Assay (FDA approved for NASAL specimens only), is one component of a comprehensive MRSA colonization surveillance program. It is not intended to diagnose MRSA infection nor to guide or monitor treatment for MRSA infections. Performed at [facility], [address] [place],  [address]     Coagulation Studies: No results for input(s): LABPROT, INR in the last 72 hours.  Urinalysis: No results for input(s): COLORURINE, LABSPEC, PHURINE, GLUCOSEU, HGBUR, BILIRUBINUR, KETONESUR, PROTEINUR, UROBILINOGEN, NITRITE, LEUKOCYTESUR in the last 72 hours.  Invalid input(s): APPERANCEUR    Imaging: Dg Chest Port 1 View  Result Date: 03/04/2019 CLINICAL DATA:  Respiratory distress EXAM: PORTABLE CHEST 1 VIEW COMPARISON:  March 03, 2019 FINDINGS: Increasing interstitial opacities throughout the left lung. More focal opacities are developing in the right base and right perihilar region. Cardiomegaly. The hila and mediastinum are unchanged. No pneumothorax. No other changes. IMPRESSION: 1. Developing focal infiltrate in the right perihilar region and right base worrisome for an infectious process/pneumonia. Mild increased interstitial markings in the left may represent pulmonary venous congestion. Electronically Signed   By: Dorise Bullion III M.D   On: 03/04/2019 19:06     Medications:   . sodium chloride 250 mL (03/05/19 0850)  . amiodarone 30 mg/hr (03/05/19 2045)  . cefTRIAXone (ROCEPHIN)  IV 1 g (03/05/19 0912)   . amitriptyline  25 mg Oral QHS  . atorvastatin  40 mg Oral Daily  . azithromycin  500 mg Oral Daily  . calcitRIOL  0.5 mcg Oral Daily  . Chlorhexidine Gluconate Cloth  6 each Topical Daily  . enoxaparin (LOVENOX) injection  40 mg Subcutaneous Daily  . fluticasone furoate-vilanterol  1 puff Inhalation Daily  .  furosemide  40 mg Intravenous Once  . furosemide  80 mg Intravenous BID  . hydrALAZINE  25 mg Oral TID  . insulin aspart  0-15 Units Subcutaneous TID WC  . insulin aspart  0-5 Units Subcutaneous QHS  . insulin regular human CONCENTRATED  20 Units Subcutaneous BID WC  . ipratropium-albuterol  3 mL Nebulization Q6H  . levothyroxine  100 mcg Oral Daily  . loratadine  10 mg Oral Daily  . mouth rinse  15 mL Mouth Rinse BID  . metoprolol tartrate  25 mg Oral BID  . pantoprazole  40 mg Oral BID  . pneumococcal 23 valent vaccine  0.5 mL Intramuscular Tomorrow-1000  . senna  1 tablet Oral BID  . 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 CKD st 5.  Serum creatinine trends are summarized below Recent Labs    03/03/19 0602 03/04/19 0548 03/05/19 0602 03/06/19 0337  CREATININE 4.40* 4.11* 4.08* 4.03*  -Patient continues to have advanced renal dysfunction however renal function has slowly improved over the past several days.  EGFR at the  moment 15.  No urgent indication for dialysis at the moment.  #. Anemia of CKD  Lab Results  Component Value Date   HGB 8.1 (L) 03/05/2019  Continue to periodically monitor CBC.  #. SHPTH  No results found for: PTH Lab Results  Component Value Date   PHOS 5.2 (H) 03/05/2019  Phosphorus acceptable at 5.2 but long-term patient will likely require binder therapy.  Maintain the patient on calcitriol for now..  #. HTN with CKD Maintain the patient on hydralazine as well as metoprolol.  #. Diabetes type 2 with CKD Hemoglobin A1C (%)  Date Value  03/14/2012 5.0   Hgb A1c MFr Bld (%)  Date Value  03/02/2019 6.1 (H)  A1c appears to be at target.  Glycemic control as per hospitalist.  #Shortness of breath, multifocal pneumonia -Currently getting treatment with azithromycin and ceftriaxone   #Atrial fibrillation with rapid ventricular response -Heart rate appears to be under good control at the moment.  LOS: 5 Emmaus Brandi 11/3/202011:14 AM  [facility], [place]

## 2019-03-06 NOTE — Progress Notes (Signed)
PROGRESS NOTE  Patrick Hurst [id number] DOB: 1955-05-08 DOA: 03/01/2019 PCP: [name], MD  Brief History   63 year old man PMH CKD stage IV, CHF, diabetes, obstructive sleep apnea on CPAP, presented with increasing shortness of breath, cough.  Admitted for multifocal pneumonia.  Treated with antibiotics and oxygen.  Subsequently developed atrial fibrillation with rapid ventricular response and was transferred to the stepdown unit, where he eventually converted to sinus rhythm.  At this point continues to require high flow nasal cannula for acute hypoxic respiratory failure, multifactorial.  Wean as tolerated, continue diuresis.  Home when oxygenation stable.  A & P  Acute hypoxic respiratory failure secondary to multifocal pneumonia seen on chest CT, acute on chronic diastolic CHF, in context of COPD, previous smoker --Appears to be improving but still on high flow nasal cannula.  Wean as tolerated.  Continue empiric antibiotics.  Continue diuresis.  Acute on chronic diastolic CHF --Excellent diuresis, -4.5 L overnight.  -6.4 L since admission.  Creatinine stable.  Continue IV diuresis.  Atrial fibrillation with rapid ventricular response. New diagnosis this admission, developed 11/1.  Converted to sinus rhythm.  Initially treated with IV diltiazem, then IV amiodarone without significant effect.  Converted with IV push metoprolol.  TSH within normal limits. --Continue management per cardiology.  Continue oral metoprolol and amiodarone infusion. --Await echocardiogram  Elevated troponin. Troponin high-sensitivity elevated 232 > 469 > 525 --Remains asymptomatic.  Elevation secondary to demand ischemia from rapid heart rate, acute hypoxia.  Chronic thrombocytopenia --Has been stable.  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  Followed by Dr. [name]  --Creatinine remained stable with aggressive diuresis.  Diabetes mellitus type 2, hemoglobin A1c 6.1  --Fasting blood sugar 198.  CBG in 200s.  Continue Humulin RU 500   Normocytic anemia appears stable.  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 --Follow periodically  Hypothyroidism. TSH within normal limits.   --Continue levothyroxine  Obstructive sleep apnea on CPAP at night.  Tolerated hospital unit poorly.  --Home unit use as tolerated  Morbid obesity Body mass index is 41.6 kg/m. --Dietitian consult  Aortic atherosclerosis --Continue atorvastatin  . Slowly improving but still on high flow nasal cannula.  Excellent diuresis with Lasix and renal function remained stable.  Continue to wean oxygen as tolerated, continue diuresis, continue antibiotics, appreciate pulmonology, cardiology, nephrology.   DVT prophylaxis: heparin gtt Code Status: Full Family Communication: none Disposition Plan: home    [name], MD  [facility] Direct contact: see [email or web address] (further directions at bottom of note if needed) 7PM-7AM contact night coverage as at bottom of note 03/06/2019, 8:57 AM  LOS: 5 days   Significant Hospital Events   . 10/29 admitted for multifocal pneumonia . 11/1 developed new onset atrial fibrillation with rapid ventricular response, transferred to stepdown unit for diltiazem infusion.  Cardiology and pulmonology consulted. . 11/2 converted to sinus rhythm, continued on amiodarone infusion   Consults:  . Nephrology . Cardiology . Pulmonology   Procedures:  .   Significant Diagnostic Tests:  . 10/29 CT chest: Multifocal parenchymal opacities right greater than left consistent with multifocal pneumonia.   Micro Data:  .    Antimicrobials:  . Ceftriaxone 10/29 > . Azithromycin 10/29 >  Interval History/Subjective  Slept well.  Breathing is only fair.  No nausea or vomiting.  Tolerating diet.  Objective   Vitals:  Vitals:   03/06/19 0854 03/06/19 0855  BP: (!) 146/74 (!) 146/74  Pulse:  85  Resp:    Temp:  SpO2:      Exam:  Constitutional.  Appears  calm, comfortable sitting in chair. Respiratory.  Clear to auscultation bilaterally but poor breath sounds and air movement.  No frank wheezes, rales or rhonchi.  Mild to moderate increased respiratory effort. ENT.  Grossly normal hearing. Cardiovascular.  Regular rate and rhythm.  No murmur, rub or gallop.  No lower extremity edema. Abdomen soft. Psychiatric.  Grossly normal mood and affect.  Speech fluent and appropriate.  I have personally reviewed the following:   Today's Data   CBG elevated.  Fasting blood sugar 198.  BUN stable at 90, creatinine stable at 4.03.  Potassium 4.5.  Magnesium 2.1.  Scheduled Meds: . amitriptyline  25 mg Oral QHS  . atorvastatin  40 mg Oral Daily  . calcitRIOL  0.5 mcg Oral Daily  . Chlorhexidine Gluconate Cloth  6 each Topical Daily  . enoxaparin (LOVENOX) injection  40 mg Subcutaneous Daily  . fluticasone furoate-vilanterol  1 puff Inhalation Daily  . furosemide  40 mg Intravenous BID  . hydrALAZINE  25 mg Oral TID  . insulin aspart  0-15 Units Subcutaneous TID WC  . insulin aspart  0-5 Units Subcutaneous QHS  . insulin regular human CONCENTRATED  20 Units Subcutaneous BID WC  . ipratropium-albuterol  3 mL Nebulization Q6H  . levothyroxine  100 mcg Oral Daily  . loratadine  10 mg Oral Daily  . mouth rinse  15 mL Mouth Rinse BID  . metoprolol tartrate  25 mg Oral BID  . pantoprazole  40 mg Oral BID  . pneumococcal 23 valent vaccine  0.5 mL Intramuscular Tomorrow-1000  . senna  1 tablet Oral BID  . vitamin B-12  1,000 mcg Oral Daily   Continuous Infusions: . sodium chloride 250 mL (03/05/19 0850)  . amiodarone 30 mg/hr (03/05/19 2045)  . cefTRIAXone (ROCEPHIN)  IV 1 g (03/05/19 0912)    Principal Problem:   Acute hypoxemic respiratory failure (HCC) Active Problems:   Diabetes mellitus with neuropathy (HCC)   Hypothyroidism   Thrombocytopenia (HCC)   Obesity, Class III, BMI 40-49.9 (morbid obesity) (HCC)   Acute respiratory failure  with hypoxia (HCC)   Acute on chronic diastolic CHF (congestive heart failure) (HCC)   COPD (chronic obstructive pulmonary disease) (HCC)   Multifocal pneumonia   CKD (chronic kidney disease) stage 5, GFR less than 15 ml/min (HCC)   Atrial fibrillation with RVR (HCC)   Elevated troponin   LOS: 5 days   How to contact the [facility] Attending or Consulting provider [place] or covering provider during after hours 7P -7A, for this patient?  1. Check the care team in [facility] and look for a) attending/consulting TR[facility] provider listed and b) the [facility] team listed 2. Log into [email or web address] and use [place]'s universal password to access. If you do not have the password, please contact the hospital operator. 3. Locate the [facility] provider you are looking for under [facility] and page to a number that you can be directly reached. 4. If you still have difficulty reaching the provider, please page the [facility] (Director on Call) for the Hospitalists listed on amion for assistance.

## 2019-03-06 NOTE — Progress Notes (Signed)
*  PRELIMINARY RESULTS* Echocardiogram 2D Echocardiogram has been performed.  Patrick Hurst 03/06/2019, 10:05 AM

## 2019-03-07 ENCOUNTER — Other Ambulatory Visit: Payer: Self-pay | Admitting: Family Medicine

## 2019-03-07 ENCOUNTER — Inpatient Hospital Stay: Payer: Commercial Managed Care - PPO

## 2019-03-07 LAB — CBC
HCT: 25.8 % — ABNORMAL LOW (ref 39.0–52.0)
Hemoglobin: 8.5 g/dL — ABNORMAL LOW (ref 13.0–17.0)
MCH: 29 pg (ref 26.0–34.0)
MCHC: 32.9 g/dL (ref 30.0–36.0)
MCV: 88.1 fL (ref 80.0–100.0)
Platelets: 149 10*3/uL — ABNORMAL LOW (ref 150–400)
RBC: 2.93 MIL/uL — ABNORMAL LOW (ref 4.22–5.81)
RDW: 14.9 % (ref 11.5–15.5)
WBC: 6.6 10*3/uL (ref 4.0–10.5)
nRBC: 0 % (ref 0.0–0.2)

## 2019-03-07 LAB — BASIC METABOLIC PANEL
Anion gap: 12 (ref 5–15)
BUN: 97 mg/dL — ABNORMAL HIGH (ref 8–23)
CO2: 26 mmol/L (ref 22–32)
Calcium: 9.3 mg/dL (ref 8.9–10.3)
Chloride: 101 mmol/L (ref 98–111)
Creatinine, Ser: 4.11 mg/dL — ABNORMAL HIGH (ref 0.61–1.24)
GFR calc Af Amer: 17 mL/min — ABNORMAL LOW (ref >60)
GFR calc non Af Amer: 15 mL/min — ABNORMAL LOW (ref >60)
Glucose, Bld: 148 mg/dL — ABNORMAL HIGH (ref 70–99)
Potassium: 4.2 mmol/L (ref 3.5–5.1)
Sodium: 139 mmol/L (ref 135–145)

## 2019-03-07 LAB — PHOSPHORUS: Phosphorus: 6.1 mg/dL — ABNORMAL HIGH (ref 2.5–4.6)

## 2019-03-07 LAB — GLUCOSE, CAPILLARY
Glucose-Capillary: 183 mg/dL — ABNORMAL HIGH (ref 70–99)
Glucose-Capillary: 308 mg/dL — ABNORMAL HIGH (ref 70–99)
Glucose-Capillary: 236 mg/dL — ABNORMAL HIGH (ref 70–99)
Glucose-Capillary: 203 mg/dL — ABNORMAL HIGH (ref 70–99)

## 2019-03-07 MED ORDER — INSULIN REGULAR HUMAN (CONC) 500 UNIT/ML ~~LOC~~ SOPN
20.0000 [IU] | PEN_INJECTOR | Freq: Two times a day (BID) | SUBCUTANEOUS | Status: DC
  Filled 2019-03-07: qty 3

## 2019-03-07 MED ORDER — INSULIN REGULAR HUMAN (CONC) 500 UNIT/ML ~~LOC~~ SOPN
20.0000 [IU] | PEN_INJECTOR | Freq: Two times a day (BID) | SUBCUTANEOUS | Status: DC
  Administered 2019-03-07: 20 [IU] via SUBCUTANEOUS
  Filled 2019-03-07: qty 3

## 2019-03-07 MED ORDER — AMOXICILLIN-POT CLAVULANATE 500-125 MG PO TABS
500.0000 mg | ORAL_TABLET | Freq: Two times a day (BID) | ORAL | Status: DC
  Filled 2019-03-07: qty 1

## 2019-03-07 MED ORDER — METOPROLOL TARTRATE 5 MG/5ML IV SOLN
5.0000 mg | INTRAVENOUS | Status: DC | PRN
  Administered 2019-03-07: 5 mg via INTRAVENOUS
  Filled 2019-03-07 (×2): qty 5

## 2019-03-07 MED ORDER — INSULIN NPH (HUMAN) (ISOPHANE) 100 UNIT/ML ~~LOC~~ SUSP
5.0000 [IU] | Freq: Two times a day (BID) | SUBCUTANEOUS | Status: DC
  Filled 2019-03-07 (×2): qty 10

## 2019-03-07 MED ORDER — CHLORHEXIDINE GLUCONATE 0.12 % MT SOLN
OROMUCOSAL | Status: AC
  Administered 2019-03-07: 22:00:00 15 mL
  Filled 2019-03-07: qty 15

## 2019-03-07 MED ORDER — AMOXICILLIN-POT CLAVULANATE 875-125 MG PO TABS
1.0000 | ORAL_TABLET | Freq: Two times a day (BID) | ORAL | Status: DC
  Administered 2019-03-07: 09:00:00 1 via ORAL
  Filled 2019-03-07 (×2): qty 1

## 2019-03-07 MED ORDER — SEVELAMER CARBONATE 800 MG PO TABS
800.0000 mg | ORAL_TABLET | Freq: Three times a day (TID) | ORAL | Status: DC
  Administered 2019-03-07 – 2019-03-09 (×6): 800 mg via ORAL
  Filled 2019-03-07 (×6): qty 1

## 2019-03-07 MED ORDER — LABETALOL HCL 5 MG/ML IV SOLN
10.0000 mg | Freq: Four times a day (QID) | INTRAVENOUS | Status: DC | PRN
  Administered 2019-03-07: 10 mg via INTRAVENOUS
  Filled 2019-03-07: qty 4

## 2019-03-07 MED ORDER — AMOXICILLIN-POT CLAVULANATE 500-125 MG PO TABS
500.0000 mg | ORAL_TABLET | Freq: Two times a day (BID) | ORAL | Status: AC
  Administered 2019-03-07: 500 mg via ORAL
  Filled 2019-03-07: qty 1

## 2019-03-07 MED ORDER — APIXABAN 5 MG PO TABS
5.0000 mg | ORAL_TABLET | Freq: Two times a day (BID) | ORAL | Status: DC
  Administered 2019-03-07 – 2019-03-09 (×5): 5 mg via ORAL
  Filled 2019-03-07 (×5): qty 1

## 2019-03-07 MED ORDER — AMIODARONE HCL 200 MG PO TABS
400.0000 mg | ORAL_TABLET | Freq: Two times a day (BID) | ORAL | Status: DC
  Administered 2019-03-07 – 2019-03-09 (×5): 400 mg via ORAL
  Filled 2019-03-07 (×6): qty 2

## 2019-03-07 MED ORDER — AMLODIPINE BESYLATE 5 MG PO TABS
5.0000 mg | ORAL_TABLET | Freq: Every day | ORAL | Status: DC
  Administered 2019-03-07 – 2019-03-09 (×3): 5 mg via ORAL
  Filled 2019-03-07 (×3): qty 1

## 2019-03-07 MED ORDER — AMOXICILLIN-POT CLAVULANATE 875-125 MG PO TABS: 1.0000 | ORAL_TABLET | Freq: Every day | ORAL | Status: DC

## 2019-03-07 NOTE — Progress Notes (Signed)
CRITICAL CARE PROGRESS NOTE    Name: Patrick Hurst MRN: [id number] DOB: 30-Mar-1956     LOS: 6   SUBJECTIVE FINDINGS & SIGNIFICANT EVENTS    Referring Provider:  Dr [name]  Reason for Referral: Acute hypoxic respiratory failure   Brief patient description: 63 Y/O male with OSA/OHS  Admitted with acute hypoxic respiratory failure, acute on chronic renal failure, and acute pulmonary edema   HPI: 33M PMH as below seen in ER for cough and SOB, s/p CXR/CT with infiltrate , serology with A/CKD, started on emp abx zithro/rocephin and O2, developed AFrVR moved to MICU for higher acuity mgt.  S/p cardio eval placed on dilt/amio with rate control only, had accelerated HTN leading to flash pulm edema s/p aggressive diuresis and NIV for severe hypoxemia.  SIGNIFICANT EVENTS: 03/01/2019: Admitted 03/04/2019: Transferred to the ICU for worsening oxygen needs an elevated heart rate 11/2 -HHFNC down to 60%, bc today, stopping heparin gtt will transition to DVT ppx dose.  11/3 - slow to diurese, will increase lasix to 80 bid, BIPAP during day , restricted fluid 1200cc/day 11/4 - Clinically improved, sitting up in chair having breakfast, Net 7700-, weaned down to [place]-2L/min   Lines / Drains: PIVx2  Cultures / Sepsis markers: MRSA PCR nasal negative COVID-19 x1 negative  Antibiotics: Pt with fluoroquinolone allergy Initially on Rocephin, decrease unnecessary infusions switch to p.o. Augmentin   Protocols / Consultants: Hospitalist, cardiology, critical care, pharmacy, nephrology  Tests / Events: TTE 11/3 -  1. Left ventricular ejection fraction, by visual estimation, is 55 to 60%. The left ventricle has normal function. There is mildly increased left ventricular hypertrophy.  2. Global right ventricle has  normal systolic function.The right ventricular size is normal. No increase in right ventricular wall thickness.  3. Left atrial size was normal.  4. Right atrial size was normal.  5. The mitral valve is normal in structure. Mild mitral valve regurgitation.  6. The tricuspid valve is normal in structure. Tricuspid valve regurgitation is trivial.  7. The aortic valve is normal in structure. Aortic valve regurgitation is not visualized.  8. The pulmonic valve was grossly normal. Pulmonic valve regurgitation is not visualized.   Overnight: Improved clinically without significant events   PAST MEDICAL HISTORY   Past Medical History:  Diagnosis Date   Allergy    Anemia    Arthritis    Bell's palsy    CHF (congestive heart failure) (HCC)    CKD (chronic kidney disease)    Depression    Diabetes (HCC)    Gastric ulcer    GERD (gastroesophageal reflux disease)    Gout    History of chicken pox    Hyperlipidemia    Hypertension    Hypothyroidism    OSA (obstructive sleep apnea)    Thyroid disease    Vitamin D deficiency      SURGICAL HISTORY   Past Surgical History:  Procedure Laterality Date   CATARACT EXTRACTION Bilateral 2014 and 2015   CHOLECYSTECTOMY  04/28/2011   Laproscopic; Dr. Pat Patrick   GALLBLADDER SURGERY     LASIK Bilateral 2019   medical   SHOULDER SURGERY Right 2012   Dr. Leanor Kail     FAMILY HISTORY   Family History  Problem Relation Age of Onset   COPD Mother    Heart disease Father      SOCIAL HISTORY   Social History   Tobacco Use   Smoking status: Former Smoker    Packs/day:  1.00    Years: 15.00    Pack years: 15.00    Types: Cigarettes    Quit date: 05/03/1978    Years since quitting: 40.8   Smokeless tobacco: Former Systems developer   Tobacco comment: started smoking at age 28  Substance Use Topics   Alcohol use: No    Alcohol/week: 0.0 standard drinks   Drug use: No     MEDICATIONS   Current  Medication:  Current Facility-Administered Medications:    0.9 %  sodium chloride infusion, , Intravenous, PRN, Samuella Cota, MD, Last Rate: 10 mL/hr at 03/05/19 0850, 250 mL at 03/05/19 0850   acetaminophen (TYLENOL) tablet 650 mg, 650 mg, Oral, Q6H PRN **OR** acetaminophen (TYLENOL) suppository 650 mg, 650 mg, Rectal, Q6H PRN, Samuella Cota, MD   albuterol (PROVENTIL) (2.5 MG/3ML) 0.083% nebulizer solution 2.5 mg, 2.5 mg, Nebulization, Q2H PRN, Samuella Cota, MD   [COMPLETED] amiodarone (NEXTERONE) 1.8 mg/mL load via infusion 150 mg, 150 mg, Intravenous, Once, 150 mg at 03/04/19 2130 **FOLLOWED BY** [EXPIRED] amiodarone (NEXTERONE PREMIX) 360-4.14 MG/200ML-% (1.8 mg/mL) IV infusion, 60 mg/hr, Intravenous, Continuous, Last Rate: 33.3 mL/hr at 03/05/19 0038, 60 mg/hr at 03/05/19 0038 **FOLLOWED BY** amiodarone (NEXTERONE PREMIX) 360-4.14 MG/200ML-% (1.8 mg/mL) IV infusion, 30 mg/hr, Intravenous, Continuous, Callwood, Dwayne D, MD, Last Rate: 16.67 mL/hr at 03/07/19 0808, 30 mg/hr at 03/07/19 0808   amitriptyline (ELAVIL) tablet 25 mg, 25 mg, Oral, QHS, Samuella Cota, MD, 25 mg at 03/06/19 2229   atorvastatin (LIPITOR) tablet 40 mg, 40 mg, Oral, Daily, Samuella Cota, MD, 40 mg at 03/06/19 [id number]   calcitRIOL (ROCALTROL) capsule 0.5 mcg, 0.5 mcg, Oral, Daily, Samuella Cota, MD, 0.5 mcg at 03/06/19 0847   cefTRIAXone (ROCEPHIN) 1 g in sodium chloride 0.9 % 100 mL IVPB, 1 g, Intravenous, Q24H, Samuella Cota, MD, Last Rate: 200 mL/hr at 03/06/19 1150, 1 g at 03/06/19 1150   Chlorhexidine Gluconate Cloth 2 % PADS 6 each, 6 each, Topical, Daily, Samuella Cota, MD, 6 each at 03/06/19 1146   enoxaparin (LOVENOX) injection 40 mg, 40 mg, Subcutaneous, Daily, Lanney Gins, Keimora Swartout, MD, 40 mg at 03/06/19 0847   fluticasone furoate-vilanterol (BREO ELLIPTA) 100-25 MCG/INH 1 puff, 1 puff, Inhalation, Daily, Samuella Cota, MD, 1 puff at 03/06/19 1142   furosemide  (LASIX) injection 80 mg, 80 mg, Intravenous, BID, Ottie Glazier, MD, 80 mg at 03/06/19 1703   hydrALAZINE (APRESOLINE) tablet 25 mg, 25 mg, Oral, TID, Samuella Cota, MD, 25 mg at 03/06/19 2230   insulin aspart (novoLOG) injection 0-15 Units, 0-15 Units, Subcutaneous, TID WC, Samuella Cota, MD, 8 Units at 03/06/19 1659   insulin aspart (novoLOG) injection 0-5 Units, 0-5 Units, Subcutaneous, QHS, Samuella Cota, MD, 2 Units at 03/06/19 2230   insulin regular human CONCENTRATED (HUMULIN R) 500 UNIT/ML kwikpen 25 Units, 25 Units, Subcutaneous, BID WC, Charlett Nose, RPH   ipratropium-albuterol (DUONEB) 0.5-2.5 (3) MG/3ML nebulizer solution 3 mL, 3 mL, Nebulization, TID, Lanney Gins, Lakaya Tolen, MD, 3 mL at 03/07/19 0709   levothyroxine (SYNTHROID) tablet 100 mcg, 100 mcg, Oral, Daily, Samuella Cota, MD, 100 mcg at 03/07/19 0630   loratadine (CLARITIN) tablet 10 mg, 10 mg, Oral, Daily, Samuella Cota, MD, 10 mg at 03/06/19 0846   LORazepam (ATIVAN) tablet 0.5 mg, 0.5 mg, Oral, Q6H PRN, Samuella Cota, MD, 0.5 mg at 03/05/19 0124   MEDLINE mouth rinse, 15 mL, Mouth Rinse, BID, Samuella Cota, MD, 15 mL at  03/06/19 2236   metoprolol tartrate (LOPRESSOR) injection 5 mg, 5 mg, Intravenous, Q5 min PRN, Callwood, Dwayne D, MD, 5 mg at 03/05/19 0120   metoprolol tartrate (LOPRESSOR) tablet 25 mg, 25 mg, Oral, BID, Callwood, Dwayne D, MD, 25 mg at 03/06/19 2229   ondansetron (ZOFRAN) tablet 4 mg, 4 mg, Oral, Q6H PRN **OR** ondansetron (ZOFRAN) injection 4 mg, 4 mg, Intravenous, Q6H PRN, Samuella Cota, MD   pantoprazole (PROTONIX) EC tablet 40 mg, 40 mg, Oral, BID, Samuella Cota, MD, 40 mg at 03/06/19 2229   pneumococcal 23 valent vaccine (PNU-IMMUNE) injection 0.5 mL, 0.5 mL, Intramuscular, Tomorrow-1000, Fritzi Mandes, MD   polyethylene glycol (MIRALAX / GLYCOLAX) packet 17 g, 17 g, Oral, Daily PRN, Samuella Cota, MD, 17 g at 03/06/19 1748   polyethylene  glycol (MIRALAX / GLYCOLAX) packet 17 g, 17 g, Oral, Daily, Ottie Glazier, MD   senna (SENOKOT) tablet 8.6 mg, 1 tablet, Oral, BID, Samuella Cota, MD, 8.6 mg at 03/06/19 2229   vitamin B-12 (CYANOCOBALAMIN) tablet 1,000 mcg, 1,000 mcg, Oral, Daily, Samuella Cota, MD, 1,000 mcg at 03/06/19 0855    ALLERGIES   Guaifenesin, Mucinex  [guaifenesin er], and Levaquin [levofloxacin]    REVIEW OF SYSTEMS     10 point ROS conducted and is negative except as per subjective findings  PHYSICAL EXAMINATION   Vital Signs: Temp:  [98 F (36.7 C)-98.4 F (36.9 C)] 98.4 F (36.9 C) (11/04 0233) Pulse Rate:  [25-102] 75 (11/04 0711) Resp:  [11-20] 16 (11/04 0233) BP: (128-164)/(50-97) 134/71 (11/04 0233) SpO2:  [92 %-99 %] 92 % (11/04 0808) FiO2 (%):  [35 %-66 %] 66 % (11/04 0711)  GENERAL: Well-nourished mild distress due to SOB HEAD: Normocephalic, atraumatic.  EYES: Pupils equal, round, reactive to light.  No scleral icterus.  MOUTH: Moist mucosal membrane. NECK: Supple. No thyromegaly. No nodules. No JVD.  PULMONARY: Crackles at the bases bilaterally CARDIOVASCULAR: S1 and S2. Regular rate and rhythm. No murmurs, rubs, or gallops.  GASTROINTESTINAL: Soft, nontender, non-distended. No masses. Positive bowel sounds. No hepatosplenomegaly.  MUSCULOSKELETAL: No swelling, clubbing, or 2+ edema pitting bilateral lower extremities NEUROLOGIC: Mild distress due to acute illness SKIN:intact,warm,dry   PERTINENT DATA     Infusions:  sodium chloride 250 mL (03/05/19 0850)   amiodarone 30 mg/hr (03/07/19 0808)   cefTRIAXone (ROCEPHIN)  IV 1 g (03/06/19 1150)   Scheduled Medications:  amitriptyline  25 mg Oral QHS   atorvastatin  40 mg Oral Daily   calcitRIOL  0.5 mcg Oral Daily   Chlorhexidine Gluconate Cloth  6 each Topical Daily   enoxaparin (LOVENOX) injection  40 mg Subcutaneous Daily   fluticasone furoate-vilanterol  1 puff Inhalation Daily   furosemide   80 mg Intravenous BID   hydrALAZINE  25 mg Oral TID   insulin aspart  0-15 Units Subcutaneous TID WC   insulin aspart  0-5 Units Subcutaneous QHS   insulin regular human CONCENTRATED  25 Units Subcutaneous BID WC   ipratropium-albuterol  3 mL Nebulization TID   levothyroxine  100 mcg Oral Daily   loratadine  10 mg Oral Daily   mouth rinse  15 mL Mouth Rinse BID   metoprolol tartrate  25 mg Oral BID   pantoprazole  40 mg Oral BID   pneumococcal 23 valent vaccine  0.5 mL Intramuscular Tomorrow-1000   polyethylene glycol  17 g Oral Daily   senna  1 tablet Oral BID   vitamin B-12  1,000 mcg  Oral Daily   PRN Medications: sodium chloride, acetaminophen **OR** acetaminophen, albuterol, LORazepam, metoprolol tartrate, ondansetron **OR** ondansetron (ZOFRAN) IV, polyethylene glycol Hemodynamic parameters:   Intake/Output: 11/03 0701 - 11/04 0700 In: 1282.1 [P.O.:770; I.V.:412.1; IV Piggyback:100] Out: 2550 [Urine:2550]  Ventilator  Settings: FiO2 (%):  [35 %-66 %] 66 %     LAB RESULTS:  Basic Metabolic Panel: Recent Labs  Lab 03/03/19 0602 03/04/19 0548 03/05/19 0419 03/05/19 0602 03/06/19 0337 03/07/19 0338  NA 141 139  --  137 140 139  K 4.8 4.3  --  4.2 4.5 4.2  CL 105 106  --  104 103 101  CO2 22 23  --  23 26 26   GLUCOSE 208* 144*  --  215* 192* 148*  BUN 89* 93*  --  93* 90* 97*  CREATININE 4.40* 4.11*  --  4.08* 4.03* 4.11*  CALCIUM 8.4* 8.3*  --  8.5* 9.0 9.3  MG  --  2.0  --  2.1 2.1  --   PHOS  --   --  5.2*  --   --  6.1*   Liver Function Tests: Recent Labs  Lab 03/05/19 0602  AST 13*  ALT 16  ALKPHOS 57  BILITOT 1.0  PROT 7.1  ALBUMIN 3.3*   No results for input(s): LIPASE, AMYLASE in the last 168 hours. No results for input(s): AMMONIA in the last 168 hours. CBC: Recent Labs  Lab 03/02/19 0017 03/02/19 0607 03/04/19 0548 03/05/19 0419 03/07/19 0338  WBC 8.4 7.9 9.3 7.2 6.6  NEUTROABS  --   --  7.9*  --   --   HGB 8.8* 9.0*  8.5* 8.1* 8.5*  HCT 27.0* 28.0* 26.5* 24.8* 25.8*  MCV 90.0 91.2 91.7 88.6 88.1  PLT 135* 137* 123* 115* 149*   Cardiac Enzymes: No results for input(s): CKTOTAL, CKMB, CKMBINDEX, TROPONINI in the last 168 hours. BNP: Invalid input(s): POCBNP CBG: Recent Labs  Lab 03/06/19 0735 03/06/19 1122 03/06/19 1642 03/06/19 2216 03/07/19 0805  GLUCAP 198* 295* 234* 215* 183*     IMAGING RESULTS:          ASSESSMENT AND PLAN    -Multidisciplinary rounds held today  Acute Hypoxic Respiratory Failure -Due to right lower lobe community-acquired pneumonia with intercurrent pulmonary edema -Switching IV antibiotics to Augmentin, patient with significant clinical improvement absence of fevers in the absence of leukocytosis -Continue 80 Lasix twice daily-renal function stable -Repeat CXR today -Weaned to [place] 2L/min goal 88% or higher -BiPAP overnight -continue Full NIV support -continue Bronchodilator Therapy   Atrial fibrillation with rapid ventricular response -Status post amiodarone gtt. Status post Cardizem gtt. -Decreasing IV infusions due to volume overload status -Switching to p.o. regimen with Lopressor and amio p.o. 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 -Diuresing with 80 Lasix twice daily -Nephrology on case-appreciate input-no urgent need for HD at this time -follow chem 7 -follow UO -continue Foley Catheter-assess need daily -Strict ins and out -1200 cc per 24-hour fluid restriction    Metabolic syndrome with uncontrolled diabetes and morbid obesity -Monitor POC glucose goal blood sugars less than 180 -Adjust Lantus/Humalog plus ISS-appreciate pharmacy collaboration -Status post dietary evaluation appreciate recommendations -PT OT evaluation   OSA/OHS overlap syndrome -Continue  BiPAP overnight while diuresing -Can resume home CPAP post resolution of hypoxemia   Hypothyroidism -Continue home levothyroxine  ID -  continue abx as prescibed -follow up cultures  GI/Nutrition GI PROPHYLAXIS as indicated DIET-->TF's as tolerated Constipation protocol as indicated   ELECTROLYTES -follow labs as needed -replace as needed -pharmacy collaboration appreciated   DVT/GI PRX ordered Lovenox 40 subcu daily ASSESS the need for LABS as needed   Critical care provider statement:     Critical care provider statement:   Critical care time (minutes): 32  Critical care time was exclusive of: Separately billable procedures and  treating other patients  Critical care was necessary to treat or prevent imminent or  life-threatening deterioration of the following conditions: Acute hypoxemic repiratory failure, community aquired pneumonia, AFrvr, OSA, mobid obesity, multiple comorbid conditions.   Critical care was time spent personally by me on the following  activities: Development of treatment plan with patient or surrogate,  discussions with consultants, evaluation of patient's response to  treatment, examination of patient, obtaining history from patient or  surrogate, ordering and performing treatments and interventions, ordering  and review of laboratory studies and re-evaluation of patient's condition  I assumed direction of critical care for this patient from another  provider in my specialty: no   This document was prepared using Dragon voice recognition software and may include unintentional dictation errors.    Ottie Glazier, M.D.  Division of [facility]

## 2019-03-07 NOTE — Consult Note (Signed)
ANTICOAGULATION CONSULT NOTE - Initial Consult  Pharmacy Consult for Apixaban Indication: Atrial Fibrillation  Allergies  Allergen Reactions  . Guaifenesin Swelling    Throat swelling, increased heart rate.  . Mucinex  [Guaifenesin Er]     Throat swelling, increases heart rate  . Levaquin [Levofloxacin] Palpitations    Patient Measurements: Height: 5\' 11"  (180.3 cm) Weight: 298 lb 4.5 oz (135.3 kg) IBW/kg (Calculated) : 75.3  Vital Signs: BP: 167/82 (11/04 1500) Pulse Rate: 73 (11/04 1500)  Labs: Recent Labs    03/04/19 1724 03/04/19 2032 03/05/19 0419 03/05/19 0602 03/05/19 1017 03/06/19 0337 03/07/19 0338  HGB  --   --  8.1*  --   --   --  8.5*  HCT  --   --  24.8*  --   --   --  25.8*  PLT  --   --  115*  --   --   --  149*  HEPARINUNFRC  --  <0.10*  --  0.16*  --   --   --   CREATININE  --   --   --  4.08*  --  4.03* 4.11*  TROPONINIHS 232* 469*  --   --  525*  --   --     Estimated Creatinine Clearance: 26.5 mL/min (A) (by C-G formula based on SCr of 4.11 mg/dL (H)).   Medical History: Past Medical History:  Diagnosis Date  . Allergy   . Anemia   . Arthritis   . Bell's palsy   . CHF (congestive heart failure) ([place])   . CKD (chronic kidney disease)   . Depression   . Diabetes ([place])   . Gastric ulcer   . GERD (gastroesophageal reflux disease)   . Gout   . History of chicken pox   . Hyperlipidemia   . Hypertension   . Hypothyroidism   . OSA (obstructive sleep apnea)   . Thyroid disease   . Vitamin D deficiency    Assessment: - Pharmacy consulted for apixaban dosing for 65 YOM with new-onset atrial fibrillation.  - CHA2DS2-VASc = 3 (HTN, CHF, DM)  Goals of Therapy:  - Monitor platelets per anticoagulation protocol. - Monitor for s/sx of bleeding.    Plan:  - Initiate apixaban 5mg  po BID. - Care management consulted for apixaban copay card.  Thank you for allowing pharmacy to be a part of this patient's care.  Raiford Simmonds, PharmD  Candidate 03/07/2019,3:57 PM

## 2019-03-07 NOTE — Evaluation (Signed)
Occupational Therapy Evaluation Patient Details Name: Patrick Hurst MRN: [id number] DOB: 09/16/1955 Today's Date: 03/07/2019    History of Present Illness From MD H&P: Patrick Hurst  is a 63 y.o. male with a known history of CKD stage IV, congestive heart failure, depression, diabetes with chronic kidney disease hypertension hyperlipidemia and obstructive sleep apnea uses CPAP comes to the emergency room with increasing shortness of breath more than usual with cough acute on chronic. Patient denies any fever nausea vomiting bodyaches   Clinical Impression   Mr. Nakano was seen for OT evaluation this date. Pt received seated upright in his room recliner with his wife present. Pt reports he was active and independent in all ADLs and mobility, prior to hospital admission. He endorses working, driving, and getting out fishing with his dog. Recently, pt reports becoming easily fatigued or out of breath with minimal exertion. Pt currently requires supervision to min assist for ADL Mgt for safety and cueing to utilize energy conservation strategies. Pt educated in energy conservation strategies including pursed lip breathing, activity pacing, home/routines modifications, work simplification, AE/DME, prioritizing of meaningful occupations, and falls prevention. Handout provided. Pt verbalized understanding but would benefit from additional skilled OT services to maximize recall and carryover of learned techniques and facilitate implementation of learned techniques into daily routines. Upon discharge, recommend [address] services.       Follow Up Recommendations  Home health OT    Equipment Recommendations  None recommended by OT(Pt has shower seat and BSC)    Recommendations for Other Services       Precautions / Restrictions Precautions Precautions: Fall Precaution Comments: Moderate fall Restrictions Weight Bearing Restrictions: No      Mobility Bed Mobility Overal bed mobility: Needs  Assistance             General bed mobility comments: Deferred. Pt up in room recliner at start/end of session.  Transfers Overall transfer level: Needs assistance                    Balance Overall balance assessment: Needs assistance Sitting-balance support: Feet supported;No upper extremity supported Sitting balance-Leahy Scale: Normal Sitting balance - Comments: Pt steady static sitting in room recliner with and without back support this date. Good static and dynamic balance during functional activities. Is able to tolerate a challenge.                                   ADL either performed or assessed with clinical judgement   ADL                                         General ADL Comments: Pt reports feeling generally near baseline for ADL mgt, however he endorses some ongoing weakness and SOB during functional mobility. Currently supervision for ADL mgt for pt safety. Would benefit from further education in falls prevention and energy conservation strategies to maximize safety, functional independence, and satisfaction with daily routines.     Vision Baseline Vision/History: Wears glasses Wears Glasses: Reading only Patient Visual Report: No change from baseline       Perception     Praxis      Pertinent Vitals/Pain Pain Assessment: No/denies pain     Hand Dominance Right   Extremity/Trunk Assessment Upper Extremity Assessment Upper Extremity Assessment: Overall [facility]  for tasks assessed(BUE WNL. Pt strength t/o BUE 5/5 with RUE ROM mildly limited by hx of rotator cuff tear/repair.)   Lower Extremity Assessment Lower Extremity Assessment: Defer to PT evaluation;Overall WFL for tasks assessed       Communication Communication Communication: No difficulties   Cognition Arousal/Alertness: Awake/alert Behavior During Therapy: WFL for tasks assessed/performed Overall Cognitive Status: Within Functional Limits for tasks  assessed                                 General Comments: Pt pleasant, agreeable to OT evaluation. Reports feeling much better this date. Follows multi-step commands consistently.   General Comments       Exercises Other Exercises Other Exercises: Pt educated in energy conservation strategies including PLB and the 4 P's, falls prevention strategies, and safe use of AE/DME for ADL mgt and functional mobility.   Shoulder Instructions      Home Living Family/patient expects to be discharged to:: Private residence Living Arrangements: Spouse/significant other Available Help at Discharge: Family;Available 24 hours/day Type of Home: Mobile home Home Access: Stairs to enter Entrance Stairs-Number of Steps: Depending upon which side of the house. Per pt and wife about 4 steps from back where patient normally goes up. Entrance Stairs-Rails: Right Home Layout: One level     Bathroom Shower/Tub: Occupational psychologist: Handicapped height     Home Equipment: Grab bars - tub/shower;Shower seat - built in;Hand held shower head;Adaptive equipment Adaptive Equipment: Reacher Additional Comments: Patient is independent at home and at work without AD's.       Prior Functioning/Environment Level of Independence: Independent        Comments: Patient is independent at home and at work. Works in [employer] and goes fishing in his free time.        OT Problem List: Decreased strength;Decreased coordination;Decreased range of motion;Decreased activity tolerance;Decreased safety awareness;Impaired balance (sitting and/or standing);Cardiopulmonary status limiting activity;Decreased knowledge of use of DME or AE      OT Treatment/Interventions: Self-care/ADL training;Balance training;Therapeutic exercise;Therapeutic activities;Energy conservation;DME and/or AE instruction;Patient/family education    OT Goals(Current goals can be found in the care plan section) Acute  Rehab OT Goals Patient Stated Goal: To go home and get back to fishing OT Goal Formulation: With patient Time For Goal Achievement: 03/21/19 Potential to Achieve Goals: Good ADL Goals Pt Will Perform Upper Body Dressing: sitting;with min guard assist;with supervision;with set-up(With LRAD PRN for improved safety and functional independence.) Pt Will Perform Lower Body Dressing: with set-up;with supervision;with adaptive equipment;sit to/from stand(With LRAD PRN for improved safety and functional independence.) Pt Will Transfer to Toilet: ambulating;bedside commode;with supervision;with set-up(With LRAD PRN for improved safety and functional independence.) Pt Will Perform Toileting - Clothing Manipulation and hygiene: sit to/from stand;with set-up;with supervision;with adaptive equipment(With LRAD PRN for improved safety and functional independence.)  OT Frequency: Min 1X/week   Barriers to D/C:            Co-evaluation              AM-PAC OT "6 Clicks" Daily Activity     Outcome Measure Help from another person eating meals?: None Help from another person taking care of personal grooming?: None Help from another person toileting, which includes using toliet, bedpan, or urinal?: A Little Help from another person bathing (including washing, rinsing, drying)?: A Little Help from another person to put on and taking off regular upper body clothing?:  A Little Help from another person to put on and taking off regular lower body clothing?: A Little 6 Click Score: 20   End of Session    Activity Tolerance: Patient tolerated treatment well Patient left: in chair;with call bell/phone within reach;with family/visitor present  OT Visit Diagnosis: Other abnormalities of gait and mobility (R26.89);Muscle weakness (generalized) (M62.81)                Time: [id number] OT Time Calculation (min): 17 min Charges:  OT General Charges $OT Visit: 1 Visit OT Evaluation $OT Eval Moderate  Complexity: 1 Mod OT Treatments $Self Care/Home Management : 8-22 mins  Shara Blazing, M.S., OTR/L Ascom: [phone] 03/07/19, 4:16 PM

## 2019-03-07 NOTE — Progress Notes (Addendum)
PROGRESS NOTE    Patrick Hurst  [id number] DOB: 1955-05-09 DOA: 03/01/2019 PCP: [name], MD      Brief Narrative:  Patrick Hurst is a 63 y.o. 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    In the ER, CXR showed bilateral opacities and he was started on empiric antibiotics.    Subsequent hospital course complicated by Afib with RVR requiring initiation of amiodarone as well as worsening renal function and fluid overload.       Assessment & Plan:  Acute Hypoxic Respiratory Failure Multifocal due to acute on chronic diastolic CHF, possible pneumonia, and COPD exacerbation.    Suspected community-acquired pneumonia Completed 7 days antibiotics. -Stop antibiotics and monitor fever curve  COPD without exacerbation In setting of multifocal PNA, acute on chronic dCHF, COPD/former smoker No significant wheezing -Wean O2 for sats 88-92% -Continue Breo, duo nebs  Acute on Chronic diastolic CHF Net -1.2 L yesterday, 7 L on admission.  Provement.  Potassium stable.  Echocardiogram shows normal EF, valves. -Furosemide 80 mg IV twice a day  -K supplement -Strict I/Os, daily weights, telemetry  -Daily monitoring renal function    Hypertensive urgency Blood pressure remains elevated -Continue metoprolol, hydralazine, furosemide -Add amlodipine  New onset atrial fibrillation with RVR Developed on 11/1, converted to NSR after diltiazem, amiodarone, lopressor. TSH wnl. TTE with LVEF 55-60% -Continue telemetry monitoring -Appreciate Cardiology input  -Continue amiodarone amiodarone, transitioned to PO 11/4  -Stop heparin, start Eliquis -PRN lopressor for HR > 115  Troponin Leak  SPECT demand ischemia in setting of respiratory distress, AFwRVR -follow troponin, tele  Chronic Thrombocytopenia  Mild, stable relative baseline -trend CBC -monitor for evidence of bleeding   CKD V Followed by Dr. [name].   Baseline sr cr  ~3 to 3.5.  Note ANA positive in 2017, 2018.  Elevated dsDNA.  No acidosis, hyperkalemia.  Urine output good with Lasix. -Continue Lasix -Consult nephrology, appreciate recommendations -follow BMP / UOP  -monitor, replace electrolytes as indicated  -renal diet  -consider follow up with Rheumatology at discharge to follow up on ANA, dsDNA  DM II  Glucose is elevated. -Continue Humulin R U5 100 -Continue sliding scale corrections  Hypothyroidism TSH wnl -Continue levothyroxine  OSA  On CPAP as outpatient.  Did not tolerated hospital system.  -home unit QHS & PRN daytime sleep   Morbid Obesity  BMI 41.6 -Dietitian following, appreciate assistance with education   Aortic Atherosclerosis -Continue atorvastatin          DVT prophylaxis: apixaban  Code Status: Full Code  Family Communication: Wife at the bedside   Significant Events  10/29 Admit   11/01 Tx to SDU for [facility]  11/02 HFNC 60%.  Heparin gtt stopped, transitioned to DVT lovenox   11/04 Transitioned of IV amiodarone, abx to PO, O2 weaned to 2L   Consultants:   Nephrology  Cardiology   Pulmonary   Procedures:      Significant Diagnostic Tests  CT Chest 10/29 >> multifocal parenchymal opacities, R>L consistent with multifocal pneumonia  ECHO 11/3 >> LVEF 55-60%, global RV systolic function normal, LA/RA normal, mild MVR, trivial TVR, pulmonic valve grossly normal    Antimicrobials:   Ceftriaxone 10/29 >> 11/4  Azithro 10/29 >> 11/4  Augmentin 11/4 >> 11/4   Cultures  COVID 10/29 >>   MRSA 10/29 >>    Subjective: He is improving.  He denies  chest pain, shortness of breath, swelling is improved.  No fever, sputum production.  Objective: Vitals:   03/07/19 1600 03/07/19 1700 03/07/19 1735 03/07/19 1800  BP: (!) 163/82 (!) 169/82 (!) 172/84 (!) 160/86  Pulse: 73 67 72 79  Resp: 16 17 14 20   Temp:      TempSrc:      SpO2: 96% 96% 96% 100%  Weight:       Height:        Intake/Output Summary (Last 24 hours) at 03/07/2019 1929 Last data filed at 03/07/2019 1500 Gross per 24 hour  Intake 1127.73 ml  Output 4600 ml  Net -3472.27 ml   Filed Weights   03/01/19 1725 03/04/19 0947  Weight: 133.4 kg 135.3 kg    Examination: General appearance: overweight adult male, alert and in no acute distress.   HEENT: Anicteric, conjunctiva pink, lids and lashes normal. No nasal deformity, discharge, epistaxis.  Lips moist, teeth normal. OP moist, no oral lesions.   Skin: Warm and dry.  No suspicious rashes or lesions. Cardiac: RRR, no murmurs appreciated.  Trace LE edema.    Respiratory: Normal respiratory rate and rhythm.  CTAB without rales or wheezes. Abdomen: Abdomen soft.  No TTP or guarding. No ascites, distension, hepatosplenomegaly.   MSK: No deformities or effusions of the large joints of the upper or lower extremities bilaterally. Neuro: Awake and alert. Naming is grossly intact, and the patient's recall, recent and remote, as well as general fund of knowledge seem within normal limits.  Muscle tone normal, without fasciculations.  Moves all extremities equally and with normal coordination.  Marland Kitchen Speech fluent.    Psych: Sensorium intact and responding to questions, attention normal. Affect normal.  Judgment and insight appear normal.  m  Data Reviewed: I have personally reviewed following labs and imaging studies:  CBC: Recent Labs  Lab 03/02/19 0017 03/02/19 0607 03/04/19 0548 03/05/19 0419 03/07/19 0338  WBC 8.4 7.9 9.3 7.2 6.6  NEUTROABS  --   --  7.9*  --   --   HGB 8.8* 9.0* 8.5* 8.1* 8.5*  HCT 27.0* 28.0* 26.5* 24.8* 25.8*  MCV 90.0 91.2 91.7 88.6 88.1  PLT 135* 137* 123* 115* [id number]*   Basic Metabolic Panel: Recent Labs  Lab 03/03/19 0602 03/04/19 0548 03/05/19 0419 03/05/19 0602 03/06/19 0337 03/07/19 0338  NA 141 139  --  137 140 139  K 4.8 4.3  --  4.2 4.5 4.2  CL 105 106  --  104 103 101  CO2 22 23  --  23 26 26    GLUCOSE 208* 144*  --  215* 192* 148*  BUN 89* 93*  --  93* 90* 97*  CREATININE 4.40* 4.11*  --  4.08* 4.03* 4.11*  CALCIUM 8.4* 8.3*  --  8.5* 9.0 9.3  MG  --  2.0  --  2.1 2.1  --   PHOS  --   --  5.2*  --   --  6.1*   GFR: Estimated Creatinine Clearance: 26.5 mL/min (A) (by C-G formula based on SCr of 4.11 mg/dL (H)). Liver Function Tests: Recent Labs  Lab 03/05/19 0602  AST 13*  ALT 16  ALKPHOS 57  BILITOT 1.0  PROT 7.1  ALBUMIN 3.3*   No results for input(s): LIPASE, AMYLASE in the last 168 hours. No results for input(s): AMMONIA in the last 168 hours. Coagulation Profile: Recent Labs  Lab 03/01/19 1927  INR 1.0   Cardiac Enzymes: No results for input(s): CKTOTAL,  CKMB, CKMBINDEX, TROPONINI in the last 168 hours. BNP (last 3 results) No results for input(s): PROBNP in the last 8760 hours. HbA1C: No results for input(s): HGBA1C in the last 72 hours. CBG: Recent Labs  Lab 03/06/19 1642 03/06/19 2216 03/07/19 0805 03/07/19 1113 03/07/19 1613  GLUCAP 234* 215* 183* 308* 236*   Lipid Profile: No results for input(s): CHOL, HDL, LDLCALC, TRIG, CHOLHDL, LDLDIRECT in the last 72 hours. Thyroid Function Tests: No results for input(s): TSH, T4TOTAL, FREET4, T3FREE, THYROIDAB in the last 72 hours. Anemia Panel: No results for input(s): VITAMINB12, FOLATE, FERRITIN, TIBC, IRON, RETICCTPCT in the last 72 hours. Urine analysis:    Component Value Date/Time   COLORURINE STRAW (A) 11/19/2017 1437   APPEARANCEUR CLEAR (A) 11/19/2017 1437   APPEARANCEUR Clear 03/13/2012 1011   LABSPEC 1.008 11/19/2017 1437   LABSPEC 1.015 03/13/2012 1011   PHURINE 5.0 11/19/2017 1437   GLUCOSEU NEGATIVE 11/19/2017 1437   GLUCOSEU Negative 03/13/2012 1011   HGBUR NEGATIVE 11/19/2017 1437   BILIRUBINUR NEGATIVE 11/19/2017 1437   BILIRUBINUR Negative 03/13/2012 1011   KETONESUR NEGATIVE 11/19/2017 1437   PROTEINUR 30 (A) 11/19/2017 1437   NITRITE NEGATIVE 11/19/2017 1437    LEUKOCYTESUR NEGATIVE 11/19/2017 1437   LEUKOCYTESUR Negative 03/13/2012 1011   Sepsis Labs: @LABRCNTIP (procalcitonin:4,lacticacidven:4)  ) Recent Results (from the past 240 hour(s))  SARS CORONAVIRUS 2 (TAT 6-24 HRS) Nasopharyngeal Nasopharyngeal Swab     Status: None   Collection Time: 03/01/19  7:27 PM   Specimen: Nasopharyngeal Swab  Result Value Ref Range Status   SARS Coronavirus 2 NEGATIVE NEGATIVE Final    Comment: (NOTE) SARS-CoV-2 target nucleic acids are NOT DETECTED. The SARS-CoV-2 RNA is generally detectable in upper and lower respiratory specimens during the acute phase of infection. Negative results do not preclude SARS-CoV-2 infection, do not rule out co-infections with other pathogens, and should not be used as the sole basis for treatment or other patient management decisions. Negative results must be combined with clinical observations, patient history, and epidemiological information. The expected result is Negative. Fact Sheet for Patients: SugarRoll.be Fact Sheet for Healthcare Providers: https://www.woods-mathews.com/ This test is not yet approved or cleared by the [place] FDA and  has been authorized for detection and/or diagnosis of SARS-CoV-2 by FDA under an Emergency Use Authorization (EUA). This EUA will remain  in effect (meaning this test can be used) for the duration of the COVID-19 declaration under Section 56 4(b)(1) of the Act, 21 U.S.C. section 360bbb-3(b)(1), unless the authorization is terminated or revoked sooner. Performed at [facility], [place] [address], [place], [place] [address]   MRSA PCR Screening     Status: None   Collection Time: 03/04/19  9:50 AM   Specimen: Nasopharyngeal  Result Value Ref Range Status   MRSA by PCR NEGATIVE NEGATIVE Final    Comment:        The GeneXpert MRSA Assay (FDA approved for NASAL specimens only), is one component of a comprehensive MRSA  colonization surveillance program. It is not intended to diagnose MRSA infection nor to guide or monitor treatment for MRSA infections. Performed at [facility], [address], [place], [place] [address]          Radiology Studies: Dg Chest [place] 1 View  Result Date: 03/07/2019 CLINICAL DATA:  Pulmonary infiltrate EXAM: PORTABLE CHEST 1 VIEW COMPARISON:  03/04/2019 FINDINGS: Significant interval improvement in heterogeneous airspace opacity previously most conspicuous in the right lung. There remains pulmonary vascular prominence. Cardiomegaly. IMPRESSION:  1. Significant interval improvement in heterogeneous airspace opacity previously most conspicuous in the right lung, consistent with nearly resolved edema. There remains pulmonary vascular prominence. 2.  Cardiomegaly. Electronically Signed   By: Eddie Candle M.D.   On: 03/07/2019 09:41        Scheduled Meds: . amiodarone  400 mg Oral BID  . amitriptyline  25 mg Oral QHS  . amLODipine  5 mg Oral Daily  . amoxicillin-clavulanate  500 mg Oral Q12H  . apixaban  5 mg Oral BID  . atorvastatin  40 mg Oral Daily  . calcitRIOL  0.5 mcg Oral Daily  . Chlorhexidine Gluconate Cloth  6 each Topical Daily  . fluticasone furoate-vilanterol  1 puff Inhalation Daily  . furosemide  80 mg Intravenous BID  . hydrALAZINE  25 mg Oral TID  . insulin aspart  0-15 Units Subcutaneous TID WC  . insulin aspart  0-5 Units Subcutaneous QHS  . insulin regular human CONCENTRATED  20 Units Subcutaneous BID WC  . ipratropium-albuterol  3 mL Nebulization TID  . levothyroxine  100 mcg Oral Daily  . loratadine  10 mg Oral Daily  . mouth rinse  15 mL Mouth Rinse BID  . metoprolol tartrate  25 mg Oral BID  . pantoprazole  40 mg Oral BID  . pneumococcal 23 valent vaccine  0.5 mL Intramuscular Tomorrow-1000  . polyethylene glycol  17 g Oral Daily  . senna  1 tablet Oral BID  . sevelamer carbonate  800 mg Oral TID WC  . vitamin B-12  1,000 mcg  Oral Daily   Continuous Infusions: . sodium chloride 250 mL (03/05/19 0850)     LOS: 6 days    Time spent: 25 minutes     Noe Gens, AG-ACNP-S [facility] 03/07/2019, 7:29 PM     Please page through [place]:  [email or web address] Password [email or web address] If 7PM-7AM, please contact night-coverage    Attending MD Note:  I have seen and examined the patient with NP-S and agree with the note above which has been edited to reflect our agreed upon history, exam, and assessment/plan.   I have personally reviewed the orders for the patient, which were made under my direction.     [place]

## 2019-03-07 NOTE — Progress Notes (Signed)
Inpatient Diabetes Program Recommendations  AACE/ADA: New Consensus Statement on Inpatient Glycemic Control   Target Ranges:  Prepandial:   less than 140 mg/dL      Peak postprandial:   less than 180 mg/dL (1-2 hours)      Critically ill patients:  140 - 180 mg/dL   Results for KEYVIN, EAVEY (MRN [id number]) as of 03/07/2019 08:20  Ref. Range 03/06/2019 07:35 03/06/2019 11:22 03/06/2019 16:42 03/06/2019 22:16 03/07/2019 08:05  Glucose-Capillary Latest Ref Range: 70 - 99 mg/dL 198 (H)  Novolog 3 units 295 (H)  Novolog 8 units  U500 20 units 234 (H)  Novolog 8 units 215 (H)  Novolog 2 units 183 (H)   Review of Glycemic Control  Diabetes history: DM2 Outpatient Diabetes medications: Humulin R U500 25 units BID Current orders for Inpatient glycemic control: Humulin R U500 25 units BID, Novolog 0-15 units TID with meals, Novolog 0-5 units QHS  Inpatient Diabetes Program Recommendations:   Insulin-Basal: In reviewing chart, noted Humulin R U500 20 units was given at 11:41 on 03/06/19 and evening dose of Humulin R U500 was NOT GIVEN on 03/06/19. Fasting glucose 183 mg/dl today. May want to consider decreasing Humulin R U500 back down to 20 units BID.  Thanks, Barnie Alderman, RN, MSN, CDE Diabetes Coordinator Inpatient Diabetes Program [phone] (Team Pager from 8am to 5pm)

## 2019-03-07 NOTE — Progress Notes (Signed)
Pharmacy Electrolyte Monitoring Consult:  Patrick Hurst is a 89 YOM admitted on 03/01/2019 with shortness of breath and cough. PMH includes: CKD stage IV, CHF, DM, obstructive sleep apnea on CPAP.  He is being treated for multifocal pneumonia in the ICU.  Patient presented in atrial fibrillation with RVR, which resolved within 48 hours.   Pharmacy has been consulted to assist in monitoring and replacing electrolytes.  Labs:  Sodium (mmol/L)  Date Value  03/07/2019 139  04/04/2018 145 (H)  05/04/2013 137   Potassium (mmol/L)  Date Value  03/07/2019 4.2  05/04/2013 4.4   Magnesium (mg/dL)  Date Value  03/06/2019 2.1  03/14/2012 1.9   Phosphorus (mg/dL)  Date Value  03/07/2019 6.1 (H)   Calcium (mg/dL)  Date Value  03/07/2019 9.3   Calcium, Total (mg/dL)  Date Value  05/04/2013 9.1   Albumin (g/dL)  Date Value  03/05/2019 3.3 (L)  04/04/2018 4.3  05/04/2013 3.3 (L)   Corrected Calcium: 9.9 mg/dL  Assessment/Plan:  Electrolytes:  - no electrolyte replacement warranted at this time. - will check labs in AM (including phosphorous)  Goals: Mg ~ 2 K ~ 4  Glucose: - Patient was transitioned back to U500 20 units BID. 11/3 PM dose and 11/4 AM dose were missed.  - Will continue to monitor blood glucose readings and consult the diabetes team for input as needed.  Thank you for allowing pharmacy to be a part of this patient's care.  Raiford Simmonds, PharmD Candidate 03/07/2019 1:24 PM

## 2019-03-07 NOTE — Progress Notes (Signed)
[facility], [place] 03/07/19  Subjective:  Patient seen at bedside in the critical care unit. Creatinine currently 4.11 with a BUN of 97. EGFR appears to be stable however at 15. Patient appears to be breathing comfortably on high flow nasal cannula.  Objective:  Vital signs in last 24 hours:  Temp:  [98 F (36.7 C)-98.4 F (36.9 C)] 98.4 F (36.9 C) (11/04 0233) Pulse Rate:  [25-102] 75 (11/04 0711) Resp:  [11-20] 16 (11/04 0233) BP: (128-164)/(50-97) 134/71 (11/04 0233) SpO2:  [92 %-99 %] 92 % (11/04 0808) FiO2 (%):  [35 %-66 %] 66 % (11/04 0711)  Weight change:  Filed Weights   03/01/19 1725 03/04/19 0947  Weight: 133.4 kg 135.3 kg    Intake/Output:    Intake/Output Summary (Last 24 hours) at 03/07/2019 0926 Last data filed at 03/07/2019 0300 Gross per 24 hour  Intake 982.08 ml  Output 2100 ml  Net -1117.92 ml    Gen:   No acute distress Head:   Normocephalic, atraumatic Eyes/ENT:  Conjunctiva clear,  moist oral mucus membranes Neck:  Supple Lungs:   Coarse bilaterally, high flow nasal cannula on Heart:   Irregular  Abdomen:   Soft, non-tender, bowel sounds active  Extremities: 1+ bilateral lower extremity edema Skin:  Skin warm, turgor normal, no rashes or lesions Neurologic: Alert and oriented, able to answer questions appropriately     Basic Metabolic Panel:  Recent Labs  Lab 03/03/19 0602 03/04/19 0548 03/05/19 0419 03/05/19 0602 03/06/19 0337 03/07/19 0338  NA 141 139  --  137 140 139  K 4.8 4.3  --  4.2 4.5 4.2  CL 105 106  --  104 103 101  CO2 22 23  --  '23 26 26  ' GLUCOSE 208* 144*  --  215* 192* 148*  BUN 89* 93*  --  93* 90* 97*  CREATININE 4.40* 4.11*  --  4.08* 4.03* 4.11*  CALCIUM 8.4* 8.3*  --  8.5* 9.0 9.3  MG  --  2.0  --  2.1 2.1  --   PHOS  --   --  5.2*  --   --  6.1*     CBC: Recent Labs  Lab 03/02/19 0017 03/02/19 0607 03/04/19 0548 03/05/19 0419 03/07/19 0338  WBC 8.4 7.9 9.3 7.2 6.6   NEUTROABS  --   --  7.9*  --   --   HGB 8.8* 9.0* 8.5* 8.1* 8.5*  HCT 27.0* 28.0* 26.5* 24.8* 25.8*  MCV 90.0 91.2 91.7 88.6 88.1  PLT 135* 137* 123* 115* 149*      Lab Results  Component Value Date   HEPBSAG Negative 11/20/2017   HEPBSAB Non Reactive 11/20/2017   HEPBIGM Negative 11/20/2017      Microbiology:  Recent Results (from the past 240 hour(s))  SARS CORONAVIRUS 2 (TAT 6-24 HRS) Nasopharyngeal Nasopharyngeal Swab     Status: None   Collection Time: 03/01/19  7:27 PM   Specimen: Nasopharyngeal Swab  Result Value Ref Range Status   SARS Coronavirus 2 NEGATIVE NEGATIVE Final    Comment: (NOTE) SARS-CoV-2 target nucleic acids are NOT DETECTED. The SARS-CoV-2 RNA is generally detectable in upper and lower respiratory specimens during the acute phase of infection. Negative results do not preclude SARS-CoV-2 infection, do not rule out co-infections with other pathogens, and should not be used as the sole basis for treatment or other patient management decisions. Negative results must be combined with clinical observations, patient history, and epidemiological information.  The expected result is Negative. Fact Sheet for Patients: SugarRoll.be Fact Sheet for Healthcare Providers: https://www.woods-mathews.com/ This test is not yet approved or cleared by the [place] FDA and  has been authorized for detection and/or diagnosis of SARS-CoV-2 by FDA under an Emergency Use Authorization (EUA). This EUA will remain  in effect (meaning this test can be used) for the duration of the COVID-19 declaration under Section 56 4(b)(1) of the Act, 21 U.S.C. section 360bbb-3(b)(1), unless the authorization is terminated or revoked sooner. Performed at [facility], [place] [address], [place], [place] [address]   MRSA PCR Screening     Status: None   Collection Time: 03/04/19  9:50 AM   Specimen: Nasopharyngeal  Result Value Ref  Range Status   MRSA by PCR NEGATIVE NEGATIVE Final    Comment:        The GeneXpert MRSA Assay (FDA approved for NASAL specimens only), is one component of a comprehensive MRSA colonization surveillance program. It is not intended to diagnose MRSA infection nor to guide or monitor treatment for MRSA infections. Performed at [facility], [address] [place],  [address]     Coagulation Studies: No results for input(s): LABPROT, INR in the last 72 hours.  Urinalysis: No results for input(s): COLORURINE, LABSPEC, PHURINE, GLUCOSEU, HGBUR, BILIRUBINUR, KETONESUR, PROTEINUR, UROBILINOGEN, NITRITE, LEUKOCYTESUR in the last 72 hours.  Invalid input(s): APPERANCEUR    Imaging: No results found.   Medications:   . sodium chloride 250 mL (03/05/19 0850)   . amiodarone  400 mg Oral BID  . amitriptyline  25 mg Oral QHS  . amoxicillin-clavulanate  1 tablet Oral Q12H  . atorvastatin  40 mg Oral Daily  . calcitRIOL  0.5 mcg Oral Daily  . Chlorhexidine Gluconate Cloth  6 each Topical Daily  . enoxaparin (LOVENOX) injection  40 mg Subcutaneous Daily  . fluticasone furoate-vilanterol  1 puff Inhalation Daily  . furosemide  80 mg Intravenous BID  . hydrALAZINE  25 mg Oral TID  . insulin aspart  0-15 Units Subcutaneous TID WC  . insulin aspart  0-5 Units Subcutaneous QHS  . insulin regular human CONCENTRATED  20 Units Subcutaneous BID WC  . ipratropium-albuterol  3 mL Nebulization TID  . levothyroxine  100 mcg Oral Daily  . loratadine  10 mg Oral Daily  . mouth rinse  15 mL Mouth Rinse BID  . metoprolol tartrate  25 mg Oral BID  . pantoprazole  40 mg Oral BID  . pneumococcal 23 valent vaccine  0.5 mL Intramuscular Tomorrow-1000  . polyethylene glycol  17 g Oral Daily  . senna  1 tablet Oral BID  . 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 CKD st 5.  Serum creatinine trends are summarized below Recent Labs    03/04/19 0548 03/05/19 0602 03/06/19 0337 03/07/19 0338  CREATININE 4.11* 4.08* 4.03* 4.11*  -Patient's renal function overall appears to have stabilized.  BUN currently 97 with a creatinine of 4.1 and EGFR 15.  Okay to maintain the patient on diuretics.  No urgent indication for dialysis.  #. Anemia of CKD  Lab Results  Component Value Date   HGB 8.5 (L) 03/07/2019  Continue to monitor CBC closely.  Consider Epogen as an outpatient.  #. SHPTH  No results found for: PTH Lab Results  Component Value Date   PHOS 6.1 (H) 03/07/2019  Start the patient on Renvela 1 tablet p.o. 3 times daily with meals.  #. HTN with CKD Continue hydralazine and metoprolol for blood pressure control.  #. Diabetes type 2 with CKD Hemoglobin A1C (%)  Date Value  03/14/2012 5.0   Hgb A1c MFr Bld (%)  Date Value  03/02/2019 6.1 (H)  Insulin-dependent Management as per hospitalist team  #Shortness of breath, multifocal pneumonia -Patient now switched to Augmentin.  #Atrial fibrillation  with rapid ventricular response -Patient on amiodarone as well as metoprolol for rate control.   LOS: 6 Muhannad Bignell 11/4/20209:26 AM  [place], [place]

## 2019-03-08 LAB — CBC
HCT: 28.4 % — ABNORMAL LOW (ref 39.0–52.0)
Hemoglobin: 9.5 g/dL — ABNORMAL LOW (ref 13.0–17.0)
MCH: 29.1 pg (ref 26.0–34.0)
MCHC: 33.5 g/dL (ref 30.0–36.0)
MCV: 87.1 fL (ref 80.0–100.0)
Platelets: 147 10*3/uL — ABNORMAL LOW (ref 150–400)
RBC: 3.26 MIL/uL — ABNORMAL LOW (ref 4.22–5.81)
RDW: 14.7 % (ref 11.5–15.5)
WBC: 7.4 10*3/uL (ref 4.0–10.5)
nRBC: 0 % (ref 0.0–0.2)

## 2019-03-08 LAB — BASIC METABOLIC PANEL
Anion gap: 12 (ref 5–15)
BUN: 95 mg/dL — ABNORMAL HIGH (ref 8–23)
CO2: 26 mmol/L (ref 22–32)
Calcium: 10 mg/dL (ref 8.9–10.3)
Chloride: 101 mmol/L (ref 98–111)
Creatinine, Ser: 4.21 mg/dL — ABNORMAL HIGH (ref 0.61–1.24)
GFR calc Af Amer: 16 mL/min — ABNORMAL LOW (ref >60)
GFR calc non Af Amer: 14 mL/min — ABNORMAL LOW (ref >60)
Glucose, Bld: 222 mg/dL — ABNORMAL HIGH (ref 70–99)
Potassium: 4.6 mmol/L (ref 3.5–5.1)
Sodium: 139 mmol/L (ref 135–145)

## 2019-03-08 LAB — GLUCOSE, CAPILLARY
Glucose-Capillary: 211 mg/dL — ABNORMAL HIGH (ref 70–99)
Glucose-Capillary: 339 mg/dL — ABNORMAL HIGH (ref 70–99)
Glucose-Capillary: 210 mg/dL — ABNORMAL HIGH (ref 70–99)
Glucose-Capillary: 205 mg/dL — ABNORMAL HIGH (ref 70–99)

## 2019-03-08 LAB — TROPONIN I (HIGH SENSITIVITY): Troponin I (High Sensitivity): 188 ng/L (ref <18)

## 2019-03-08 MED ORDER — INSULIN REGULAR HUMAN (CONC) 500 UNIT/ML ~~LOC~~ SOPN
25.0000 [IU] | PEN_INJECTOR | Freq: Two times a day (BID) | SUBCUTANEOUS | Status: DC
  Filled 2019-03-08: qty 3

## 2019-03-08 MED ORDER — INSULIN REGULAR HUMAN (CONC) 500 UNIT/ML ~~LOC~~ SOPN
30.0000 [IU] | PEN_INJECTOR | Freq: Two times a day (BID) | SUBCUTANEOUS | Status: DC
  Administered 2019-03-08 – 2019-03-09 (×3): 30 [IU] via SUBCUTANEOUS
  Filled 2019-03-08: qty 3

## 2019-03-08 MED ORDER — METOPROLOL TARTRATE 50 MG PO TABS
50.0000 mg | ORAL_TABLET | Freq: Two times a day (BID) | ORAL | Status: DC
  Administered 2019-03-08 – 2019-03-09 (×3): 50 mg via ORAL
  Filled 2019-03-08 (×3): qty 1

## 2019-03-08 MED ORDER — FUROSEMIDE 10 MG/ML IJ SOLN
40.0000 mg | Freq: Two times a day (BID) | INTRAMUSCULAR | Status: DC
  Administered 2019-03-08: 40 mg via INTRAVENOUS
  Filled 2019-03-08: qty 4

## 2019-03-08 MED ORDER — INSULIN REGULAR HUMAN (CONC) 500 UNIT/ML ~~LOC~~ SOPN: 30.0000 [IU] | PEN_INJECTOR | Freq: Two times a day (BID) | SUBCUTANEOUS | Status: DC

## 2019-03-08 MED ORDER — SODIUM CHLORIDE 0.9% FLUSH
3.0000 mL | Freq: Two times a day (BID) | INTRAVENOUS | Status: DC
  Administered 2019-03-08: 3 mL via INTRAVENOUS

## 2019-03-08 NOTE — Progress Notes (Signed)
Pharmacy Electrolyte Monitoring Consult:  TM is a 73 YOM admitted on 03/01/2019 with shortness of breath and cough. PMH includes: CKD stage IV, CHF, DM, obstructive sleep apnea on CPAP. Patient presented in atrial fibrillation with RVR, which resolved within 48 hours of admission. He completed 7 days of abx for multifocal PNA.  Patient is currently on Lasix 80mg  IV BID and is not receiving IV fluids.  Pharmacy has been consulted to assist in monitoring and replacing electrolytes.  Labs:  Sodium (mmol/L)  Date Value  03/08/2019 139  04/04/2018 145 (H)  05/04/2013 137   Potassium (mmol/L)  Date Value  03/08/2019 4.6  05/04/2013 4.4   Magnesium (mg/dL)  Date Value  03/06/2019 2.1  03/14/2012 1.9   Phosphorus (mg/dL)  Date Value  03/07/2019 6.1 (H)   Calcium (mg/dL)  Date Value  03/08/2019 10.0   Calcium, Total (mg/dL)  Date Value  05/04/2013 9.1   Albumin (g/dL)  Date Value  03/05/2019 3.3 (L)  04/04/2018 4.3  05/04/2013 3.3 (L)   Corrected Calcium: 10.6 mg/dL  Assessment/Plan:  Electrolytes:  - patient receiving sevelamer 800mg  TID for hyperphosphatemia - no other electrolyte replacements warranted at this time. - will check labs in AM (including phosphorous)  Goals:  - maintain electrolytes within normal limits  Glucose: - Patient was transitioned to U500 30 units BID by primary team.  - Patient initially charted as eating 0% of meal, confirmed with RN that patient did eat. - Will continue to monitor BG readings and consult the diabetes team for input as needed.  Thank you for allowing pharmacy to be a part of this patient's care.  Raiford Simmonds, PharmD Candidate 03/08/2019 1:46 PM

## 2019-03-08 NOTE — Progress Notes (Addendum)
PROGRESS NOTE    Patrick Hurst  [id number] DOB: [birth year] DOA: 03/01/2019 PCP: Patrick Sons, MD      Brief Narrative:  Patrick Hurst is a 63 y.o. 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    In the ER, CXR showed bilateral opacities and he was started on empiric antibiotics.    Subsequent hospital course complicated by Afib with RVR requiring initiation of amiodarone as well as worsening renal function and fluid overload.       Assessment & Plan:  Acute Hypoxic Respiratory Failure Multifocal due to acute on chronic diastolic CHF, possible pneumonia, and COPD exacerbation. -pulmonary hygiene - IS, mobilize -O2 if needed to support sats 88-92%.  Removed 11/5 with stable saturations.  -assess ambulatory O2 needs prior to discharge  Suspected community-acquired pneumonia Completed 7 days antibiotics. -Monitor off abx  COPD without exacerbation In setting of multifocal PNA, acute on chronic dCHF, COPD/former smoker.  No wheezing on exam.  -Continue Breo -Continue as needed Duoneb  Acute on Chronic diastolic CHF UOP [id number] in [id number], net -4.9 L in the last day, 13 L on admission. echocardiogram shows normal EF, valves.  Still has some tremors edema -Continue 1 more day IV Lasix -Close monitoring renal function on diuresis  -strict I/O's, daily weights, tele monitoring  -PT/OT efforts   Hypertensive Urgency Blood pressure elevated -Continue Lasix hydralazine, amlodipine  -Continue Lopressor, increased dose to 50 mg BID   New onset atrial fibrillation with RVR Developed on 11/1, converted to NSR after diltiazem, amiodarone, lopressor. TSH wnl. TTE with LVEF 55-60% -continue tele monitoring  -Continue Eliquis for anticoagulation -PRN IV lopressor for HR> 115 -Continue amiodarone  -will need Cardiology referral at discharge  Troponin Leak  Suspect demand ischemia in setting of respiratory distress,  AFwRVR -follow up troponin 11/5 to ensure clearance (cleared, 188) -tele monitoring   Chronic Thrombocytopenia  Mild, stable relative baseline -trend CBC, monitor for bleeding  CKD V Followed by Patrick Hurst.  Baseline sr cr  ~3 to 3.5.  Note ANA positive in 2017, 2018.  Elevated dsDNA.  No acidosis, hyperkalemia.  Urine output good with Lasix.  No acidosis, hyperkalemia, or uremia.  Fluid overload responding well to diuresis. -Continue lasix  -Appreciate Nephrology  -follow BMP/UOP  -monitor electrolytes closely with aggressive diuresis  -renal diet as tolerated  -consider follow up as outpatient with Rheumatology regarding ANA, dsDNA   DM II  Glucose elevated in the last 24 hours. -Continue SSI  -Continue Humulin R U500, increase to home dose 30 units BID (home dose, he indicates he sometimes takes more depending on glucose trend)  Hypothyroidism TSH wnl -Continue levothyroxine   OSA  On CPAP as outpatient.  Did not tolerated hospital machine.  -Continue CPAP via home unit QHS & PRN daytime sleep   Morbid Obesity  BMI 41.6 -Dietitian following, appreciate input   Aortic Atherosclerosis -Continue atorvastatin          DVT prophylaxis: apixaban  Code Status: Full Code  Family Communication: Patient updated on plan of care.    Significant Events  10/29 Admit   11/01 Tx to SDU for [facility] [employer]  11/02 HFNC 60%.  Heparin gtt stopped, transitioned to DVT lovenox   11/04 Transitioned of IV amiodarone, abx to PO, O2 weaned to 2L   O2 weaned off.    Consultants:   Nephrology  Cardiology   Pulmonary   Procedures:  Significant Diagnostic Tests  CT Chest 10/29 >> multifocal parenchymal opacities, R>L consistent with multifocal pneumonia  ECHO 11/3 >> LVEF 55-60%, global RV systolic function normal, LA/RA normal, mild MVR, trivial TVR, pulmonic valve grossly normal    Antimicrobials:   Ceftriaxone 10/29 >> 11/4  Azithro 10/29 >> 11/4  Augmentin  11/4 >> 11/4   Cultures  COVID 10/29 >> negative  MRSA 10/29 >> negative    Subjective: Dyspnea is resolved.  No fever, confusion.  Swelling still present.  Pt reports he feels better overall.  Had difficulty sleeping last night due to BiPAP machine > felt as though he didn't get enough air.  Frustrated with having to talk about dialysis.  I/O- 6.1L UOP in 24h, net neg 4.9L.  Pt reports glucose not controlled as not on home dosing of insulin.   Objective: Vitals:   03/08/19 1200 03/08/19 1400 03/08/19 1500 03/08/19 1600  BP: (!) 148/76 137/64 (!) 152/79 104/78  Pulse: 67 67 72 72  Resp: 10 20 19 14   Temp:      TempSrc:      SpO2: 95% 92% 95% 94%  Weight:      Height:        Intake/Output Summary (Last 24 hours) at 03/08/2019 1637 Last data filed at 03/08/2019 1444 Gross per 24 hour  Intake 360 ml  Output 4350 ml  Net -3990 ml   Filed Weights   03/01/19 1725 03/04/19 0947  Weight: 133.4 kg 135.3 kg    Examination: General appearance: This adult male, alert and in no acute distress.  Sitting in recliner. HEENT: Anicteric, conjunctiva pink, lids and lashes normal. No nasal deformity, discharge, epistaxis.  Lips moist, dentition in poor repair . OP moist, no oral lesions.   Skin: Warm and dry.  No suspicious rashes or lesions. Cardiac: RRR, no murmurs appreciated.  JVP not visible, 1+ LE edema.    Respiratory: Normal respiratory rate and rhythm.  CTAB without rales or wheezes. Abdomen: Abdomen soft.  No TTP or guarding. No ascites, distension, hepatosplenomegaly.   MSK: No deformities or effusions of the large joints of the upper or lower extremities bilaterally. Neuro: Awake and alert. Naming is grossly intact, and the patient's recall, recent and remote, as well as general fund of knowledge seem within normal limits.  Muscle tone normal, without fasciculations.  Moves all extremities equally and with normal coordination.  Marland Kitchen Speech fluent.    Psych: Sensorium intact and  responding to questions, attention normal. Affect normal.  Judgment and insight appear normal.    Data Reviewed: I have personally reviewed following labs and imaging studies:  CBC: Recent Labs  Lab 03/02/19 0607 03/04/19 0548 03/05/19 0419 03/07/19 0338 03/08/19 0525  WBC 7.9 9.3 7.2 6.6 7.4  NEUTROABS  --  7.9*  --   --   --   HGB 9.0* 8.5* 8.1* 8.5* 9.5*  HCT 28.0* 26.5* 24.8* 25.8* 28.4*  MCV 91.2 91.7 88.6 88.1 87.1  PLT 137* 123* 115* 149* [id number]*   Basic Metabolic Panel: Recent Labs  Lab 03/04/19 0548 03/05/19 0419 03/05/19 0602 03/06/19 0337 03/07/19 0338 03/08/19 0525  NA 139  --  137 140 139 139  K 4.3  --  4.2 4.5 4.2 4.6  CL 106  --  104 103 101 101  CO2 23  --  23 26 26 26   GLUCOSE 144*  --  215* 192* 148* 222*  BUN 93*  --  93* 90* 97* 95*  CREATININE 4.11*  --  4.08* 4.03* 4.11* 4.21*  CALCIUM 8.3*  --  8.5* 9.0 9.3 10.0  MG 2.0  --  2.1 2.1  --   --   PHOS  --  5.2*  --   --  6.1*  --    GFR: Estimated Creatinine Clearance: 25.9 mL/min (A) (by C-G formula based on SCr of 4.21 mg/dL (H)). Liver Function Tests: Recent Labs  Lab 03/05/19 0602  AST 13*  ALT 16  ALKPHOS 57  BILITOT 1.0  PROT 7.1  ALBUMIN 3.3*   No results for input(s): LIPASE, AMYLASE in the last 168 hours. No results for input(s): AMMONIA in the last 168 hours. Coagulation Profile: Recent Labs  Lab 03/01/19 1927  INR 1.0   Cardiac Enzymes: No results for input(s): CKTOTAL, CKMB, CKMBINDEX, TROPONINI in the last 168 hours. BNP (last 3 results) No results for input(s): PROBNP in the last 8760 hours. HbA1C: No results for input(s): HGBA1C in the last 72 hours. CBG: Recent Labs  Lab 03/07/19 1613 03/07/19 2138 03/08/19 0739 03/08/19 1105 03/08/19 1602  GLUCAP 236* 203* 211* 339* 210*   Lipid Profile: No results for input(s): CHOL, HDL, LDLCALC, TRIG, CHOLHDL, LDLDIRECT in the last 72 hours. Thyroid Function Tests: No results for input(s): TSH, T4TOTAL, FREET4,  T3FREE, THYROIDAB in the last 72 hours. Anemia Panel: No results for input(s): VITAMINB12, FOLATE, FERRITIN, TIBC, IRON, RETICCTPCT in the last 72 hours. Urine analysis:    Component Value Date/Time   COLORURINE STRAW (A) 11/19/2017 1437   APPEARANCEUR CLEAR (A) 11/19/2017 1437   APPEARANCEUR Clear 03/13/2012 1011   LABSPEC 1.008 11/19/2017 1437   LABSPEC 1.015 03/13/2012 1011   PHURINE 5.0 11/19/2017 1437   GLUCOSEU NEGATIVE 11/19/2017 1437   GLUCOSEU Negative 03/13/2012 1011   HGBUR NEGATIVE 11/19/2017 1437   BILIRUBINUR NEGATIVE 11/19/2017 1437   BILIRUBINUR Negative 03/13/2012 1011   KETONESUR NEGATIVE 11/19/2017 1437   PROTEINUR 30 (A) 11/19/2017 1437   NITRITE NEGATIVE 11/19/2017 1437   LEUKOCYTESUR NEGATIVE 11/19/2017 1437   LEUKOCYTESUR Negative 03/13/2012 1011   Sepsis Labs: @LABRCNTIP (procalcitonin:4,lacticacidven:4)  ) Recent Results (from the past 240 hour(s))  SARS CORONAVIRUS 2 (TAT 6-24 HRS) Nasopharyngeal Nasopharyngeal Swab     Status: None   Collection Time: 03/01/19  7:27 PM   Specimen: Nasopharyngeal Swab  Result Value Ref Range Status   SARS Coronavirus 2 NEGATIVE NEGATIVE Final    Comment: (NOTE) SARS-CoV-2 target nucleic acids are NOT DETECTED. The SARS-CoV-2 RNA is generally detectable in upper and lower respiratory specimens during the acute phase of infection. Negative results do not preclude SARS-CoV-2 infection, do not rule out co-infections with other pathogens, and should not be used as the sole basis for treatment or other patient management decisions. Negative results must be combined with clinical observations, patient history, and epidemiological information. The expected result is Negative. Fact Sheet for Patients: SugarRoll.be Fact Sheet for Healthcare Providers: https://www.woods-mathews.com/ This test is not yet approved or cleared by the [place] FDA and  has been authorized for  detection and/or diagnosis of SARS-CoV-2 by FDA under an Emergency Use Authorization (EUA). This EUA will remain  in effect (meaning this test can be used) for the duration of the COVID-19 declaration under Section 56 4(b)(1) of the Act, 21 U.S.C. section 360bbb-3(b)(1), unless the authorization is terminated or revoked sooner. Performed at [facility], [place] [address] [place], [place] [address]   MRSA PCR Screening     Status: None   Collection Time: 03/04/19  9:50 AM  Specimen: Nasopharyngeal  Result Value Ref Range Status   MRSA by PCR NEGATIVE NEGATIVE Final    Comment:        The GeneXpert MRSA Assay (FDA approved for NASAL specimens only), is one component of a comprehensive MRSA colonization surveillance program. It is not intended to diagnose MRSA infection nor to guide or monitor treatment for MRSA infections. Performed at [facility], [address] [place], [place] [address]          Radiology Studies: Dg Chest [place] 1 View  Result Date: 03/07/2019 CLINICAL DATA:  Pulmonary infiltrate EXAM: PORTABLE CHEST 1 VIEW COMPARISON:  03/04/2019 FINDINGS: Significant interval improvement in heterogeneous airspace opacity previously most conspicuous in the right lung. There remains pulmonary vascular prominence. Cardiomegaly. IMPRESSION: 1. Significant interval improvement in heterogeneous airspace opacity previously most conspicuous in the right lung, consistent with nearly resolved edema. There remains pulmonary vascular prominence. 2.  Cardiomegaly. Electronically Signed   By: Eddie Candle M.D.   On: 03/07/2019 09:41        Scheduled Meds: . amiodarone  400 mg Oral BID  . amitriptyline  25 mg Oral QHS  . amLODipine  5 mg Oral Daily  . apixaban  5 mg Oral BID  . atorvastatin  40 mg Oral Daily  . calcitRIOL  0.5 mcg Oral Daily  . Chlorhexidine Gluconate Cloth  6 each Topical Daily  . fluticasone furoate-vilanterol  1 puff Inhalation Daily  .  furosemide  40 mg Intravenous BID  . hydrALAZINE  25 mg Oral TID  . insulin aspart  0-15 Units Subcutaneous TID WC  . insulin aspart  0-5 Units Subcutaneous QHS  . insulin regular human CONCENTRATED  30 Units Subcutaneous BID WC  . ipratropium-albuterol  3 mL Nebulization TID  . levothyroxine  100 mcg Oral Daily  . loratadine  10 mg Oral Daily  . mouth rinse  15 mL Mouth Rinse BID  . metoprolol tartrate  50 mg Oral BID  . pantoprazole  40 mg Oral BID  . pneumococcal 23 valent vaccine  0.5 mL Intramuscular Tomorrow-1000  . polyethylene glycol  17 g Oral Daily  . senna  1 tablet Oral BID  . sevelamer carbonate  800 mg Oral TID WC  . vitamin B-12  1,000 mcg Oral Daily   Continuous Infusions: . sodium chloride 250 mL (03/05/19 0850)     LOS: 7 days    Time spent: 35 minutes  Noe Gens, AG-ACNP-[place]   [facility] 03/08/2019, 4:37 PM     Please page through [place]:  [email or web address] Password [email or web address] If 7PM-7AM, please contact night-coverage      Attending MD Note:  I have seen and examined the patient with NP student and agree with the note above which has been edited to reflect our agreed upon history, exam, and assessment/plan.    I have personally reviewed the orders for the patient, which were made under my direction.        [place]

## 2019-03-08 NOTE — Progress Notes (Addendum)
CRITICAL CARE PROGRESS NOTE    Name: Patrick Hurst MRN: [id number] DOB: [birth year]     LOS: 7   SUBJECTIVE FINDINGS & SIGNIFICANT EVENTS    Referring Provider:  Dr [name]  Reason for Referral: Acute hypoxic respiratory failure   Brief patient description: 63 Y/O male with OSA/OHS  Admitted with acute hypoxic respiratory failure, acute on chronic renal failure, and acute pulmonary edema   HPI: 26M PMH as below seen in ER for cough and SOB, s/p CXR/CT with infiltrate , serology with A/CKD, started on emp abx zithro/rocephin and O2, developed AFrVR moved to MICU for higher acuity mgt.  S/p cardio eval placed on dilt/amio with rate control only, had accelerated HTN leading to flash pulm edema s/p aggressive diuresis and NIV for severe hypoxemia.  SIGNIFICANT EVENTS: 03/01/2019: Admitted 03/04/2019: Transferred to the ICU for worsening oxygen needs an elevated heart rate 11/2 -HHFNC down to 60%, bc today, stopping heparin gtt will transition to DVT ppx dose.  11/3 - slow to diurese, will increase lasix to 80 bid, BIPAP during day , restricted fluid 1200cc/day 11/4 - Clinically improved, sitting up in chair having breakfast, Net 7700-, weaned down to [place]-2L/min 11/5- - patient walking around hallway doing PT no hypoxemia on low dose , net 13L negative   Lines / Drains: PIVx2  Cultures / Sepsis markers: MRSA PCR nasal negative COVID-19 x1 negative  Antibiotics: Pt with fluoroquinolone allergy Initially on Rocephin, decrease unnecessary infusions switch to p.o. Augmentin   Protocols / Consultants: Hospitalist, cardiology, critical care, pharmacy, nephrology  Tests / Events: TTE 11/3 -  1. Left ventricular ejection fraction, by visual estimation, is 55 to 60%. The left ventricle has normal  function. There is mildly increased left ventricular hypertrophy.  2. Global right ventricle has normal systolic function.The right ventricular size is normal. No increase in right ventricular wall thickness.  3. Left atrial size was normal.  4. Right atrial size was normal.  5. The mitral valve is normal in structure. Mild mitral valve regurgitation.  6. The tricuspid valve is normal in structure. Tricuspid valve regurgitation is trivial.  7. The aortic valve is normal in structure. Aortic valve regurgitation is not visualized.  8. The pulmonic valve was grossly normal. Pulmonic valve regurgitation is not visualized.   Overnight: Improved clinically without significant events   PAST MEDICAL HISTORY   Past Medical History:  Diagnosis Date  . Allergy   . Anemia   . Arthritis   . Bell's palsy   . CHF (congestive heart failure) ([place])   . CKD (chronic kidney disease)   . Depression   . Diabetes ([place])   . Gastric ulcer   . GERD (gastroesophageal reflux disease)   . Gout   . History of chicken pox   . Hyperlipidemia   . Hypertension   . Hypothyroidism   . OSA (obstructive sleep apnea)   . Thyroid disease   . Vitamin D deficiency      SURGICAL HISTORY   Past Surgical History:  Procedure Laterality Date  . CATARACT EXTRACTION Bilateral 2014 and 2015  . CHOLECYSTECTOMY  04/28/2011   Laproscopic; Dr. Pat Patrick  . GALLBLADDER SURGERY    . LASIK Bilateral 2019   medical  . SHOULDER SURGERY Right 2012   Dr. Leanor Kail     FAMILY HISTORY   Family History  Problem Relation Age of Onset  . COPD Mother   . Heart disease Father      SOCIAL HISTORY  Social History   Tobacco Use  . Smoking status: Former Smoker    Packs/day: 1.00    Years: 15.00    Pack years: 15.00    Types: Cigarettes    Quit date: 05/03/1978    Years since quitting: 40.8  . Smokeless tobacco: Former Systems developer  . Tobacco comment: started smoking at age 30  Substance Use Topics  . Alcohol use: No     Alcohol/week: 0.0 standard drinks  . Drug use: No     MEDICATIONS   Current Medication:  Current Facility-Administered Medications:  .  0.9 %  sodium chloride infusion, , Intravenous, PRN, Samuella Cota, MD, Last Rate: 10 mL/hr at 03/05/19 0850, 250 mL at 03/05/19 0850 .  acetaminophen (TYLENOL) tablet 650 mg, 650 mg, Oral, Q6H PRN **OR** acetaminophen (TYLENOL) suppository 650 mg, 650 mg, Rectal, Q6H PRN, Samuella Cota, MD .  albuterol (PROVENTIL) (2.5 MG/3ML) 0.083% nebulizer solution 2.5 mg, 2.5 mg, Nebulization, Q2H PRN, Samuella Cota, MD .  amiodarone (PACERONE) tablet 400 mg, 400 mg, Oral, BID, Ottie Glazier, MD, 400 mg at 03/08/19 1011 .  amitriptyline (ELAVIL) tablet 25 mg, 25 mg, Oral, QHS, Samuella Cota, MD, 25 mg at 03/07/19 2155 .  amLODipine (NORVASC) tablet 5 mg, 5 mg, Oral, Daily, Danford, Suann Larry, MD, 5 mg at 03/08/19 1010 .  apixaban (ELIQUIS) tablet 5 mg, 5 mg, Oral, BID, Charlett Nose, RPH, 5 mg at 03/08/19 1010 .  atorvastatin (LIPITOR) tablet 40 mg, 40 mg, Oral, Daily, Samuella Cota, MD, 40 mg at 03/08/19 1011 .  calcitRIOL (ROCALTROL) capsule 0.5 mcg, 0.5 mcg, Oral, Daily, Samuella Cota, MD, 0.5 mcg at 03/08/19 1009 .  Chlorhexidine Gluconate Cloth 2 % PADS 6 each, 6 each, Topical, Daily, Samuella Cota, MD, 6 each at 03/07/19 1521 .  fluticasone furoate-vilanterol (BREO ELLIPTA) 100-25 MCG/INH 1 puff, 1 puff, Inhalation, Daily, Samuella Cota, MD, 1 puff at 03/08/19 1007 .  furosemide (LASIX) injection 80 mg, 80 mg, Intravenous, BID, Ottie Glazier, MD, 80 mg at 03/08/19 0803 .  hydrALAZINE (APRESOLINE) tablet 25 mg, 25 mg, Oral, TID, Samuella Cota, MD, 25 mg at 03/08/19 1012 .  insulin aspart (novoLOG) injection 0-15 Units, 0-15 Units, Subcutaneous, TID WC, Samuella Cota, MD, 11 Units at 03/08/19 1146 .  insulin aspart (novoLOG) injection 0-5 Units, 0-5 Units, Subcutaneous, QHS, Samuella Cota, MD, 2  Units at 03/07/19 2159 .  insulin regular human CONCENTRATED (HUMULIN R) 500 UNIT/ML kwikpen 30 Units, 30 Units, Subcutaneous, BID WC, Charlett Nose, RPH, 30 Units at 03/08/19 1147 .  ipratropium-albuterol (DUONEB) 0.5-2.5 (3) MG/3ML nebulizer solution 3 mL, 3 mL, Nebulization, TID, Lanney Gins, Parsa Rickett, MD, 3 mL at 03/08/19 1302 .  labetalol (NORMODYNE) injection 10 mg, 10 mg, Intravenous, Q6H PRN, Danford, Suann Larry, MD, 10 mg at 03/07/19 2205 .  levothyroxine (SYNTHROID) tablet 100 mcg, 100 mcg, Oral, Daily, Samuella Cota, MD, 100 mcg at 03/08/19 0542 .  loratadine (CLARITIN) tablet 10 mg, 10 mg, Oral, Daily, Samuella Cota, MD, 10 mg at 03/08/19 1012 .  LORazepam (ATIVAN) tablet 0.5 mg, 0.5 mg, Oral, Q6H PRN, Samuella Cota, MD, 0.5 mg at 03/05/19 0124 .  MEDLINE mouth rinse, 15 mL, Mouth Rinse, BID, Samuella Cota, MD, 15 mL at 03/08/19 1012 .  metoprolol tartrate (LOPRESSOR) tablet 50 mg, 50 mg, Oral, BID, Danford, Suann Larry, MD, 50 mg at 03/08/19 1009 .  ondansetron (ZOFRAN) tablet 4 mg, 4 mg,  Oral, Q6H PRN **OR** ondansetron (ZOFRAN) injection 4 mg, 4 mg, Intravenous, Q6H PRN, Samuella Cota, MD .  pantoprazole (PROTONIX) EC tablet 40 mg, 40 mg, Oral, BID, Samuella Cota, MD, 40 mg at 03/08/19 1010 .  pneumococcal 23 valent vaccine (PNU-IMMUNE) injection 0.5 mL, 0.5 mL, Intramuscular, Tomorrow-1000, Patel, Sona, MD .  polyethylene glycol (MIRALAX / GLYCOLAX) packet 17 g, 17 g, Oral, Daily PRN, Samuella Cota, MD, 17 g at 03/06/19 1748 .  polyethylene glycol (MIRALAX / GLYCOLAX) packet 17 g, 17 g, Oral, Daily, Lanney Gins, Johnny Latu, MD, 17 g at 03/08/19 1007 .  senna (SENOKOT) tablet 8.6 mg, 1 tablet, Oral, BID, Samuella Cota, MD, 8.6 mg at 03/08/19 1011 .  sevelamer carbonate (RENVELA) tablet 800 mg, 800 mg, Oral, TID WC, Lateef, Munsoor, MD, 800 mg at 03/08/19 1146 .  vitamin B-12 (CYANOCOBALAMIN) tablet 1,000 mcg, 1,000 mcg, Oral, Daily, Samuella Cota, MD, 1,000 mcg at 03/08/19 1010    ALLERGIES   Guaifenesin, Mucinex  [guaifenesin er], and Levaquin [levofloxacin]    REVIEW OF SYSTEMS     10 point ROS conducted and is negative except as per subjective findings  PHYSICAL EXAMINATION   Vital Signs: Temp:  [98.6 F (37 C)-98.8 F (37.1 C)] 98.6 F (37 C) (11/05 0600) Pulse Rate:  [61-86] 67 (11/05 1200) Resp:  [10-29] 10 (11/05 1200) BP: (133-187)/(66-127) 148/76 (11/05 1200) SpO2:  [90 %-100 %] 95 % (11/05 1200)  GENERAL: Well-nourished mild distress due to SOB HEAD: Normocephalic, atraumatic.  EYES: Pupils equal, round, reactive to light.  No scleral icterus.  MOUTH: Moist mucosal membrane. NECK: Supple. No thyromegaly. No nodules. No JVD.  PULMONARY: Crackles at the bases bilaterally CARDIOVASCULAR: S1 and S2. Regular rate and rhythm. No murmurs, rubs, or gallops.  GASTROINTESTINAL: Soft, nontender, non-distended. No masses. Positive bowel sounds. No hepatosplenomegaly.  MUSCULOSKELETAL: No swelling, clubbing, or 2+ edema pitting bilateral lower extremities NEUROLOGIC: Mild distress due to acute illness SKIN:intact,warm,dry   PERTINENT DATA     Infusions: . sodium chloride 250 mL (03/05/19 0850)   Scheduled Medications: . amiodarone  400 mg Oral BID  . amitriptyline  25 mg Oral QHS  . amLODipine  5 mg Oral Daily  . apixaban  5 mg Oral BID  . atorvastatin  40 mg Oral Daily  . calcitRIOL  0.5 mcg Oral Daily  . Chlorhexidine Gluconate Cloth  6 each Topical Daily  . fluticasone furoate-vilanterol  1 puff Inhalation Daily  . furosemide  80 mg Intravenous BID  . hydrALAZINE  25 mg Oral TID  . insulin aspart  0-15 Units Subcutaneous TID WC  . insulin aspart  0-5 Units Subcutaneous QHS  . insulin regular human CONCENTRATED  30 Units Subcutaneous BID WC  . ipratropium-albuterol  3 mL Nebulization TID  . levothyroxine  100 mcg Oral Daily  . loratadine  10 mg Oral Daily  . mouth rinse  15 mL Mouth Rinse BID   . metoprolol tartrate  50 mg Oral BID  . pantoprazole  40 mg Oral BID  . pneumococcal 23 valent vaccine  0.5 mL Intramuscular Tomorrow-1000  . polyethylene glycol  17 g Oral Daily  . senna  1 tablet Oral BID  . sevelamer carbonate  800 mg Oral TID WC  . vitamin B-12  1,000 mcg Oral Daily   PRN Medications: sodium chloride, acetaminophen **OR** acetaminophen, albuterol, labetalol, LORazepam, ondansetron **OR** ondansetron (ZOFRAN) IV, polyethylene glycol Hemodynamic parameters:   Intake/Output: 11/04 0701 - 11/05 0700 In:  1127.7 [P.O.:840; I.V.:287.7] Out: 6100 [Urine:6100]  Ventilator  Settings:       LAB RESULTS:  Basic Metabolic Panel: Recent Labs  Lab 03/04/19 0548 03/05/19 0419 03/05/19 0602 03/06/19 0337 03/07/19 0338 03/08/19 0525  NA 139  --  137 140 139 139  K 4.3  --  4.2 4.5 4.2 4.6  CL 106  --  104 103 101 101  CO2 23  --  23 26 26 26   GLUCOSE 144*  --  215* 192* 148* 222*  BUN 93*  --  93* 90* 97* 95*  CREATININE 4.11*  --  4.08* 4.03* 4.11* 4.21*  CALCIUM 8.3*  --  8.5* 9.0 9.3 10.0  MG 2.0  --  2.1 2.1  --   --   PHOS  --  5.2*  --   --  6.1*  --    Liver Function Tests: Recent Labs  Lab 03/05/19 0602  AST 13*  ALT 16  ALKPHOS 57  BILITOT 1.0  PROT 7.1  ALBUMIN 3.3*   No results for input(s): LIPASE, AMYLASE in the last 168 hours. No results for input(s): AMMONIA in the last 168 hours. CBC: Recent Labs  Lab 03/02/19 0607 03/04/19 0548 03/05/19 0419 03/07/19 0338 03/08/19 0525  WBC 7.9 9.3 7.2 6.6 7.4  NEUTROABS  --  7.9*  --   --   --   HGB 9.0* 8.5* 8.1* 8.5* 9.5*  HCT 28.0* 26.5* 24.8* 25.8* 28.4*  MCV 91.2 91.7 88.6 88.1 87.1  PLT 137* 123* 115* 149* 147*   Cardiac Enzymes: No results for input(s): CKTOTAL, CKMB, CKMBINDEX, TROPONINI in the last 168 hours. BNP: Invalid input(s): POCBNP CBG: Recent Labs  Lab 03/07/19 1113 03/07/19 1613 03/07/19 2138 03/08/19 0739 03/08/19 1105  GLUCAP 308* 236* 203* 211* 339*      IMAGING RESULTS:          ASSESSMENT AND PLAN    -Multidisciplinary rounds held today  Acute Hypoxic Respiratory Failure -Due to right lower lobe community-acquired pneumonia with intercurrent pulmonary edema -Switching IV antibiotics to Augmentin, patient with significant clinical improvement absence of fevers in the absence of leukocytosis -Continue 40 Lasix twice daily-renal function stable -Repeat CXR today -Weaned to [place] 2L/min goal 88% or higher -BiPAP overnight -continue Full NIV support -continue Bronchodilator Therapy   Atrial fibrillation with rapid ventricular response -Status post amiodarone gtt. Now PO  Status post Cardizem gtt. Now PO -Decreasing IV infusions due to volume overload status -Switching to p.o. regimen with Lopressor and amio p.o. 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 -Diuresing with 80 Lasix twice daily -Nephrology on case-appreciate input-no urgent need for HD at this time -follow chem 7 -follow UO -continue Foley Catheter-assess need daily -Strict ins and out -1200 cc per 24-hour fluid restriction    Metabolic syndrome with uncontrolled diabetes and morbid obesity -Monitor POC glucose goal blood sugars less than 180 -Adjust Lantus/Humalog plus ISS-appreciate pharmacy collaboration -Status post dietary evaluation appreciate recommendations -PT OT evaluation   OSA/OHS overlap syndrome -Continue BiPAP overnight while diuresing -Can resume home CPAP post resolution of hypoxemia   Hypothyroidism -Continue home levothyroxine  ID -continue abx as prescibed -follow up cultures  GI/Nutrition GI PROPHYLAXIS as indicated DIET-->TF's as tolerated Constipation protocol as indicated   ELECTROLYTES -follow labs as needed -replace as needed -pharmacy  collaboration appreciated   DVT/GI  PRX ordered Lovenox 40 subcu daily ASSESS the need for LABS as needed   This document was prepared using Dragon voice recognition software and may include unintentional dictation errors.    Ottie Glazier, M.D.  Division of [place]

## 2019-03-08 NOTE — Evaluation (Signed)
Physical Therapy Evaluation Patient Details Name: Patrick Hurst MRN: [id number] DOB: [birth year] Today's Date: 03/08/2019   History of Present Illness  presented to ER secondary to progressive SOB, cough, hypoxia; admitted for management of acute hypoxic respiratory failure due to multifocal PNA  Clinical Impression  Upon evaluation, patient alert and oriented; follows commands and eager for mobility.  Bilat UE/LE strength and ROM grossly symmetrical and WFL.  Denies pain and endorses only minimal SOB (now weaned to 1L supplemental O2 via [place]).  Demonstrates ability to complete sit/stand, basic transfers and gait (10') without assist device, cga/close sup; broad BOS with excessive sway bilat (R > L).  Does endorse some mild "weakness" in bilat knees and prefers use of assist device for optimal safety at this time, improving gait performance to 150' with RW, sup.  Sats 90-91% on 1L with exertion; BORG 5/10 after distance.  Do recommend continued use of assist device at this time-for both balance/safety and overall energy conservation; patient to have wife bring his cane from home for trial next session. Would benefit from skilled PT to address above deficits and promote optimal return to PLOF.; will maintain on caseload throughout remaining stay to ensure continued mobility and facilitate weaning of O2 with mobiltiy.  Anticipte no formal PT needs upon discharge.    Follow Up Recommendations (will maintain on caseload; anticipate no PT follow up upon discharge)    Equipment Recommendations       Recommendations for Other Services       Precautions / Restrictions Precautions Precaution Comments: Moderate fall Restrictions Weight Bearing Restrictions: No      Mobility  Bed Mobility               General bed mobility comments: seated in recliner beginning/end of treatment session  Transfers Overall transfer level: Needs assistance Equipment used: None Transfers: Sit to/from  Stand Sit to Stand: Supervision;Modified independent (Device/Increase time)         General transfer comment: fair/good LE strength and power, minimal use of UEs to complete; does endorse slight "weakness" in bilat knees with initial activity  Ambulation/Gait Ambulation/Gait assistance: Min guard Gait Distance (Feet): 10 Feet Assistive device: None       General Gait Details: broad BOS with excessive sway bilat (R > L); does endorse some weakness in bilat knees (reporting history of arthritic changes in bilat knees priro to admission)  Stairs            Wheelchair Mobility    Modified Rankin (Stroke Patients Only)       Balance Overall balance assessment: Needs assistance Sitting-balance support: No upper extremity supported;Feet supported Sitting balance-Leahy Scale: Normal     Standing balance support: No upper extremity supported Standing balance-Leahy Scale: Fair                               Pertinent Vitals/Pain Pain Assessment: No/denies pain    Home Living Family/patient expects to be discharged to:: Private residence Living Arrangements: Spouse/significant other Available Help at Discharge: Family;Available 24 hours/day Type of Home: Mobile home Home Access: Stairs to enter   Entrance Stairs-Number of Steps: Depending upon which side of the house. Per pt and wife about 4 steps from back where patient normally goes up.   Home Equipment: Grab bars - tub/shower;Shower seat - built in;Hand held shower head;Adaptive equipment      Prior Function Level of Independence: Independent  Comments: Patient is independent at home and at work. 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     Hand Dominance        Extremity/Trunk Assessment   Upper Extremity Assessment Upper Extremity Assessment: Overall WFL for tasks assessed    Lower Extremity Assessment Lower Extremity Assessment: Overall WFL  for tasks assessed       Communication   Communication: No difficulties  Cognition Arousal/Alertness: Awake/alert Behavior During Therapy: WFL for tasks assessed/performed Overall Cognitive Status: Within Functional Limits for tasks assessed                                        General Comments      Exercises  150' with RW, sup--broad BOS, excessive R > L sway, but no buckling or LOB.  Improved comfort/confidence with assist device at this time; plans to have wife bring his cane from home for next session.  Sats 90-91% on 1L with exertion; BORG 5/10 after distance.   Assessment/Plan    PT Assessment Patient needs continued PT services  PT Problem List Cardiopulmonary status limiting activity;Decreased mobility;Decreased activity tolerance;Decreased balance       PT Treatment Interventions DME instruction;Gait training;Stair training;Functional mobility training;Therapeutic activities;Therapeutic exercise;Balance training;Patient/family education    PT Goals (Current goals can be found in the Care Plan section)  Acute Rehab PT Goals Patient Stated Goal: To go home and get back to fishing PT Goal Formulation: With patient Time For Goal Achievement: 03/22/19 Potential to Achieve Goals: Good    Frequency Min 2X/week   Barriers to discharge        Co-evaluation               AM-PAC PT "6 Clicks" Mobility  Outcome Measure Help needed turning from your back to your side while in a flat bed without using bedrails?: None Help needed moving from lying on your back to sitting on the side of a flat bed without using bedrails?: None Help needed moving to and from a bed to a chair (including a wheelchair)?: None Help needed standing up from a chair using your arms (e.g., wheelchair or bedside chair)?: None Help needed to walk in hospital room?: None Help needed climbing 3-5 steps with a railing? : A Little 6 Click Score: 23    End of Session Equipment  Utilized During Treatment: Gait belt Activity Tolerance: Patient tolerated treatment well Patient left: in chair;with call bell/phone within reach Nurse Communication: Mobility status PT Visit Diagnosis: Difficulty in walking, not elsewhere classified (R26.2)    Time: 1040-1056 PT Time Calculation (min) (ACUTE ONLY): 16 min   Charges:   PT Evaluation $PT Eval Moderate Complexity: 1 Mod          Larue Lightner H. Owens Shark, PT, DPT, NCS 03/08/19, 11:23 AM [phone]

## 2019-03-08 NOTE — Progress Notes (Signed)
Patient refused bath this morning and evening. Patient was educated on Importance and patient stated "I will take one tomorrow when my wife gives it to me." Patient's linens were changed.

## 2019-03-08 NOTE — Progress Notes (Signed)
[place], [place] 03/08/19  Subjective:  Patient seen and evaluated at bedside. BUN currently 95 with a creatinine of 4.21 and EGFR 14. Urine output was 6.1 L over the preceding 24 hours.  Objective:  Vital signs in last 24 hours:  Temp:  [98.6 F (37 C)-98.8 F (37.1 C)] 98.6 F (37 C) (11/05 0600) Pulse Rate:  [61-89] 77 (11/05 0900) Resp:  [10-29] 15 (11/05 0900) BP: (133-187)/(62-127) 143/72 (11/05 0900) SpO2:  [87 %-100 %] 92 % (11/05 0900)  Weight change:  Filed Weights   03/01/19 1725 03/04/19 0947  Weight: 133.4 kg 135.3 kg    Intake/Output:    Intake/Output Summary (Last 24 hours) at 03/08/2019 0959 Last data filed at 03/08/2019 0600 Gross per 24 hour  Intake 501.35 ml  Output 5400 ml  Net -4898.65 ml    Gen:   No acute distress Head:   Normocephalic, atraumatic Eyes/ENT:  Conjunctiva clear,  moist oral mucus membranes Neck:  Supple Lungs:   Basilar rales, normal effort Heart:   Irregular  Abdomen:   Soft, non-tender, bowel sounds active  Extremities: 1+ bilateral lower extremity edema Skin:  Skin warm, turgor normal, no rashes or lesions Neurologic: Alert and oriented, able to answer questions appropriately     Basic Metabolic Panel:  Recent Labs  Lab 03/04/19 0548 03/05/19 0419 03/05/19 0602 03/06/19 0337 03/07/19 0338 03/08/19 0525  NA 139  --  137 140 139 139  K 4.3  --  4.2 4.5 4.2 4.6  CL 106  --  104 103 101 101  CO2 23  --  '23 26 26 26  ' GLUCOSE 144*  --  215* 192* 148* 222*  BUN 93*  --  93* 90* 97* 95*  CREATININE 4.11*  --  4.08* 4.03* 4.11* 4.21*  CALCIUM 8.3*  --  8.5* 9.0 9.3 10.0  MG 2.0  --  2.1 2.1  --   --   PHOS  --  5.2*  --   --  6.1*  --      CBC: Recent Labs  Lab 03/02/19 0607 03/04/19 0548 03/05/19 0419 03/07/19 0338 03/08/19 0525  WBC 7.9 9.3 7.2 6.6 7.4  NEUTROABS  --  7.9*  --   --   --   HGB 9.0* 8.5* 8.1* 8.5* 9.5*  HCT 28.0* 26.5* 24.8* 25.8* 28.4*  MCV 91.2 91.7  88.6 88.1 87.1  PLT 137* 123* 115* 149* 147*      Lab Results  Component Value Date   HEPBSAG Negative 11/20/2017   HEPBSAB Non Reactive 11/20/2017   HEPBIGM Negative 11/20/2017      Microbiology:  Recent Results (from the past 240 hour(s))  SARS CORONAVIRUS 2 (TAT 6-24 HRS) Nasopharyngeal Nasopharyngeal Swab     Status: None   Collection Time: 03/01/19  7:27 PM   Specimen: Nasopharyngeal Swab  Result Value Ref Range Status   SARS Coronavirus 2 NEGATIVE NEGATIVE Final    Comment: (NOTE) SARS-CoV-2 target nucleic acids are NOT DETECTED. The SARS-CoV-2 RNA is generally detectable in upper and lower respiratory specimens during the acute phase of infection. Negative results do not preclude SARS-CoV-2 infection, do not rule out co-infections with other pathogens, and should not be used as the sole basis for treatment or other patient management decisions. Negative results must be combined with clinical observations, patient history, and epidemiological information. The expected result is Negative. Fact Sheet for Patients: SugarRoll.be Fact Sheet for Healthcare Providers: https://www.woods-mathews.com/ This test is not yet approved  or cleared by the [place] and  has been authorized for detection and/or diagnosis of SARS-CoV-2 by FDA under an Emergency Use Authorization (EUA). This EUA will remain  in effect (meaning this test can be used) for the duration of the COVID-19 declaration under Section 56 4(b)(1) of the Act, 21 U.S.C. section 360bbb-3(b)(1), unless the authorization is terminated or revoked sooner. Performed at [facility], [place] [address], [place], [place] [address]   MRSA PCR Screening     Status: None   Collection Time: 03/04/19  9:50 AM   Specimen: Nasopharyngeal  Result Value Ref Range Status   MRSA by PCR NEGATIVE NEGATIVE Final    Comment:        The GeneXpert MRSA Assay (FDA approved for NASAL  specimens only), is one component of a comprehensive MRSA colonization surveillance program. It is not intended to diagnose MRSA infection nor to guide or monitor treatment for MRSA infections. Performed at [facility], [address] [place],  [address]     Coagulation Studies: No results for input(s): LABPROT, INR in the last 72 hours.  Urinalysis: No results for input(s): COLORURINE, LABSPEC, PHURINE, GLUCOSEU, HGBUR, BILIRUBINUR, KETONESUR, PROTEINUR, UROBILINOGEN, NITRITE, LEUKOCYTESUR in the last 72 hours.  Invalid input(s): APPERANCEUR    Imaging: Dg Chest Port 1 View  Result Date: 03/07/2019 CLINICAL DATA:  Pulmonary infiltrate EXAM: PORTABLE CHEST 1 VIEW COMPARISON:  03/04/2019 FINDINGS: Significant interval improvement in heterogeneous airspace opacity previously most conspicuous in the right lung. There remains pulmonary vascular prominence. Cardiomegaly. IMPRESSION: 1. Significant interval improvement in heterogeneous airspace opacity previously most conspicuous in the right lung, consistent with nearly resolved edema. There remains pulmonary vascular prominence. 2.  Cardiomegaly. Electronically Signed   By: Eddie Candle M.D.   On: 03/07/2019 09:41     Medications:   . sodium chloride 250 mL (03/05/19 0850)   . amiodarone  400 mg Oral BID  . amitriptyline  25 mg Oral QHS  . amLODipine  5 mg Oral Daily  . apixaban  5 mg Oral BID  . atorvastatin  40 mg Oral Daily  . calcitRIOL  0.5 mcg Oral Daily  . Chlorhexidine Gluconate Cloth  6 each Topical Daily  . fluticasone furoate-vilanterol  1 puff Inhalation Daily  . furosemide  80 mg Intravenous BID  . hydrALAZINE  25 mg Oral TID  . insulin aspart  0-15 Units Subcutaneous TID WC  . insulin aspart  0-5 Units Subcutaneous QHS  . insulin regular human CONCENTRATED  25 Units Subcutaneous BID WC  . ipratropium-albuterol  3 mL Nebulization TID  . levothyroxine  100 mcg Oral Daily  . loratadine  10 mg  Oral Daily  . mouth rinse  15 mL Mouth Rinse BID  . metoprolol tartrate  50 mg Oral BID  . pantoprazole  40 mg Oral BID  . pneumococcal 23 valent vaccine  0.5 mL Intramuscular Tomorrow-1000  . polyethylene glycol  17 g Oral Daily  . senna  1 tablet Oral BID  . sevelamer carbonate  800 mg Oral TID WC  . 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 CKD st 5.  Serum creatinine trends are summarized below Recent Labs    03/05/19 0602 03/06/19 0337 03/07/19 0338 03/08/19 0525  CREATININE 4.08* 4.03* 4.11* 4.21*  -Creatinine is a bit higher over the past 2 days.  eGFR currently 14.  However patient with excellent urine output.  Therefore no urgent indication for dialysis at the moment however we did discuss with the patient that this may be a possibility in the relative near future.  #. Anemia of CKD  Lab Results  Component Value Date   HGB 9.5 (L) 03/08/2019  Continue to monitor CBC.  No urgent indication for Epogen at the moment.  Consider as  an outpatient.  #. SHPTH  No results found for: PTH Lab Results  Component Value Date   PHOS 6.1 (H) 03/07/2019  Phosphorus 6.1 at last check.  Maintain the patient on Renvela 1 tablet p.o. 3 times daily with meals.  #. HTN with CKD Continue hydralazine and metoprolol for blood pressure control.  #. Diabetes type 2 with CKD Hemoglobin A1C (%)  Date Value  03/14/2012 5.0   Hgb A1c MFr Bld (%)  Date Value  03/02/2019 6.1 (H)  A1c currently at target.  Glycemic control as per hospitalist.    #Shortness of breath, multifocal pneumonia -Patient treated with Augmentin and now off of it.  #Atrial fibrillation with rapid ventricular response -Patient on amiodarone as well as metoprolol for rate control.   LOS: 7 Doneen Ollinger 11/5/20209:59 AM  [place], [place]

## 2019-03-08 NOTE — Consult Note (Signed)
ANTICOAGULATION CONSULT NOTE - Initial Consult  Pharmacy Consult for Apixaban Indication: Atrial Fibrillation  Allergies  Allergen Reactions  . Guaifenesin Swelling    Throat swelling, increased heart rate.  . Mucinex  [Guaifenesin Er]     Throat swelling, increases heart rate  . Levaquin [Levofloxacin] Palpitations    Patient Measurements: Height: 5\' 11"  (180.3 cm) Weight: 298 lb 4.5 oz (135.3 kg) IBW/kg (Calculated) : 75.3  Vital Signs: Temp: 98.6 F (37 C) (11/05 0600) Temp Source: Oral (11/05 0600) BP: 148/76 (11/05 1200) Pulse Rate: 67 (11/05 1200)  Labs: Recent Labs    03/06/19 0337 03/07/19 0338 03/08/19 0525 03/08/19 0842  HGB  --  8.5* 9.5*  --   HCT  --  25.8* 28.4*  --   PLT  --  149* 147*  --   CREATININE 4.03* 4.11* 4.21*  --   TROPONINIHS  --   --   --  188*    Estimated Creatinine Clearance: 25.9 mL/min (A) (by C-G formula based on SCr of 4.21 mg/dL (H)).  Medical History: Past Medical History:  Diagnosis Date  . Allergy   . Anemia   . Arthritis   . Bell's palsy   . CHF (congestive heart failure) ([place])   . CKD (chronic kidney disease)   . Depression   . Diabetes ([place])   . Gastric ulcer   . GERD (gastroesophageal reflux disease)   . Gout   . History of chicken pox   . Hyperlipidemia   . Hypertension   . Hypothyroidism   . OSA (obstructive sleep apnea)   . Thyroid disease   . Vitamin D deficiency    - 11/4: Pharmacy consulted for dosing for 8 YOM with new-onset atrial fibrillation.  - CHA2DS2-VASc = 3 (HTN, CHF, DM)  Assessment: - Platelets and H&H are stable.  Goals of Therapy:  - Monitor platelets per anticoagulation protocol. - Monitor for s/sx of bleeding.    Plan:  - Continue apixaban 5mg  po BID. - Will continue to assess for discharge counseling. - Care management consulted for apixaban copay card.  Thank you for allowing pharmacy to be a part of this patient's care.  Raiford Simmonds, PharmD Candidate 03/08/2019,2:12  PM

## 2019-03-09 DIAGNOSIS — E039 Hypothyroidism, unspecified: Secondary | ICD-10-CM

## 2019-03-09 LAB — BASIC METABOLIC PANEL
Anion gap: 13 (ref 5–15)
BUN: 105 mg/dL — ABNORMAL HIGH (ref 8–23)
CO2: 24 mmol/L (ref 22–32)
Calcium: 9.4 mg/dL (ref 8.9–10.3)
Chloride: 102 mmol/L (ref 98–111)
Creatinine, Ser: 4.57 mg/dL — ABNORMAL HIGH (ref 0.61–1.24)
GFR calc Af Amer: 15 mL/min — ABNORMAL LOW (ref >60)
GFR calc non Af Amer: 13 mL/min — ABNORMAL LOW (ref >60)
Glucose, Bld: 162 mg/dL — ABNORMAL HIGH (ref 70–99)
Potassium: 4.3 mmol/L (ref 3.5–5.1)
Sodium: 139 mmol/L (ref 135–145)

## 2019-03-09 LAB — GLUCOSE, CAPILLARY
Glucose-Capillary: 175 mg/dL — ABNORMAL HIGH (ref 70–99)
Glucose-Capillary: 262 mg/dL — ABNORMAL HIGH (ref 70–99)

## 2019-03-09 LAB — CBC
HCT: 27.4 % — ABNORMAL LOW (ref 39.0–52.0)
Hemoglobin: 8.9 g/dL — ABNORMAL LOW (ref 13.0–17.0)
MCH: 28.6 pg (ref 26.0–34.0)
MCHC: 32.5 g/dL (ref 30.0–36.0)
MCV: 88.1 fL (ref 80.0–100.0)
Platelets: 167 10*3/uL (ref 150–400)
RBC: 3.11 MIL/uL — ABNORMAL LOW (ref 4.22–5.81)
RDW: 14.7 % (ref 11.5–15.5)
WBC: 7.4 10*3/uL (ref 4.0–10.5)
nRBC: 0 % (ref 0.0–0.2)

## 2019-03-09 LAB — PHOSPHORUS: Phosphorus: 7.1 mg/dL — ABNORMAL HIGH (ref 2.5–4.6)

## 2019-03-09 MED ORDER — AMIODARONE HCL 200 MG PO TABS: ORAL_TABLET | ORAL | 0 refills | Status: DC

## 2019-03-09 MED ORDER — TORSEMIDE 20 MG PO TABS
40.0000 mg | ORAL_TABLET | Freq: Two times a day (BID) | ORAL | Status: DC
  Administered 2019-03-09: 40 mg via ORAL
  Filled 2019-03-09: qty 2

## 2019-03-09 MED ORDER — TORSEMIDE 20 MG PO TABS: 40.0000 mg | ORAL_TABLET | Freq: Two times a day (BID) | ORAL | 3 refills | Status: DC

## 2019-03-09 MED ORDER — APIXABAN 5 MG PO TABS: 5.0000 mg | ORAL_TABLET | Freq: Two times a day (BID) | ORAL | 3 refills | Status: DC

## 2019-03-09 MED ORDER — METOPROLOL TARTRATE 50 MG PO TABS: 50.0000 mg | ORAL_TABLET | Freq: Two times a day (BID) | ORAL | 3 refills | Status: DC

## 2019-03-09 MED ORDER — SEVELAMER CARBONATE 800 MG PO TABS
1600.0000 mg | ORAL_TABLET | Freq: Three times a day (TID) | ORAL | Status: DC
  Administered 2019-03-09: 1600 mg via ORAL
  Filled 2019-03-09: qty 2

## 2019-03-09 MED ORDER — FUROSEMIDE 40 MG PO TABS: 40.0000 mg | ORAL_TABLET | Freq: Two times a day (BID) | ORAL | Status: DC

## 2019-03-09 MED ORDER — AMLODIPINE BESYLATE 5 MG PO TABS: 5.0000 mg | ORAL_TABLET | Freq: Every day | ORAL | 3 refills | Status: DC

## 2019-03-09 NOTE — Discharge Summary (Signed)
Physician Discharge Summary  Patrick Hurst [id number] DOB: 08-18-1955 DOA: 03/01/2019  PCP: [name], MD  Admit date: 03/01/2019 Discharge date: 03/09/2019  Admitted From: Home Disposition: Home  Recommendations for Outpatient Follow-up:  1. Follow-up with nephrology, Dr. [name] in 1 week 2. Follow-up with cardiology when able to schedule appointment, within 2 to 3 weeks for chronic diastolic heart failure and new onset atrial fibrillation 3. Follow-up with PCP, Dr. Caryn Section in 1 week 4. Dr. [name], please obtain basic metabolic panel in 1 week on new diuretic dose 5. Follow-up with sleep medicine in 2 to 3 weeks      Home Health: None Equipment/Devices: None  Discharge Condition: Good CODE STATUS: Full Diet recommendation: Diabetic, renal      Brief/Interim Summary: Patrick Hurst is a 63 y.o. 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    In the ER, CXR showed bilateral opacities and he was started on empiric antibiotics.    Subsequent hospital course complicated by Afib with RVR requiring initiation of amiodarone as well as worsening renal function and fluid overload.         PRINCIPAL HOSPITAL DIAGNOSIS: Acute hypoxic respiratory failure    Discharge Diagnoses:   Acute Hypoxic Respiratory Failure The patient's respiratory failure was from fluid overload/acute on chronic diastolic CHF, possible pneumonia, superimposed on underlying suspected COPD.  Weaned to room air prior to discharge.    Suspected community-acquired pneumonia Completed 7 days antibiotics.  COPD without exacerbation  Acute on Chronic diastolic CHF Patient admitted and started on IV diuretics.  He had excellent diuresis, 13 L on admission.  Echocardiogram obtained showed normal EF, normal valves.  Discharged on increased dose of torsemide.  Has close follow-up with nephrology.     Hypertensive  Urgency Essential hypertension Blood pressure regimen augmented during hospitalization  New onset atrial fibrillation with RVR This developed on 11/1; patient was started on amiodarone, diltiazem, and Lopressor and converted to NSR. TSH wnl. TTE with LVEF 55-60%  He was started on heparin, transitioned to Eliquis.  Troponin Leak  Suspect demand ischemia in setting of respiratory distress, AFwRVR  Chronic Thrombocytopenia  Mild, stable relative baseline  CKD V Followed by Dr. [name].  Baseline creatinine is 3-3.5.  During his hospitalization and diuresis, nephrology were consulted and followed daily.  He developed no acidosis, hyperkalemia, and fluid overload remained responsive to diuretics.  Before discharge he was transitioned to oral diuretics, and had good urine output.  His creatinine at discharge to trend up to 4.5.  Close follow-up with nephrology.  We remain concerned that he is trending towards requiring hemodialysis soon.  DM II   Hypothyroidism  OSA  Sleep medicine consultation recommended at discharge  Morbid Obesity  BMI 41.6  Aortic Atherosclerosis              Discharge Instructions  Discharge Instructions    AMB referral to pulmonary rehabilitation   Complete by: As directed    Please select a program: Respiratory Care Services   Respiratory Care Services Diagnosis: Heart Failure Comment - Diastolic Heart Failure, COPD, and OSA.     After initial evaluation and assessments completed: Virtual Based Care may be provided alone or in conjunction with Pulmonary Rehab/Respiratory Care services based on patient barriers.: Yes   Discharge instructions   Complete by: As directed    From Dr. Loleta Books: You were admitted for trouble breathing.   We  found that this was from fluid overload (in other words, a "congestive heart failure flare") Thankfully, we were able to successfully treat this with diuretics.  We have made numerous adjustments to  your medicines: Increase your torsemide  --> take torsemide 40 mg (two tabs) twice daily Increase your metoprolol --> take metoprolol 50 mg (1 tab) twice daily Decrease your amlodipine --> take amlodipine 5 mg (1 tab) once daily  ALSO:  While you were here, we discovered that you occasionally have Afib (or "atrial fibrillation") This abnormal heart rhythm is a cause of strokes, so for this reason, it is important to take a blood thinner like Eliquis  Take apixaban/Eliquis 5 mg twice daily Watch for bloody or black and tarry bowel movements and tell your other doctors that you take ablood thinner  Also, to regulate the heart from going too fast with A Fib:  Take amiodarone  You have to load the amiodarone first, meaning you take a large dose at first and then reduce to a lower dose: For the next 5 days take amiodarone 400 mg (two tabs) twice daily Then starting Thursday Nov 12 take amiodarone 200 mg (one tab) once daily  You will take amiodarone 200 mg once daily until Cardiology tells you to stop I have only given you one month prescription, so the heart doctor will have to refill this.   We have scheduled you an appointment with Dr. [name], the heart doctor and the Pulmonology doctor  You should schedule an appointment with your primary care doctor   Increase activity slowly   Complete by: As directed      Allergies as of 03/09/2019      Reactions   Guaifenesin Swelling   Throat swelling, increased heart rate.   Mucinex  [guaifenesin Er]    Throat swelling, increases heart rate   Levaquin [levofloxacin] Palpitations      Medication List    STOP taking these medications   aspirin 81 MG tablet   tadalafil 10 MG tablet Commonly known as: CIALIS     TAKE these medications   albuterol 108 (90 Base) MCG/ACT inhaler Commonly known as: VENTOLIN HFA USE 2 INHALATIONS EVERY 4 HOURS AS NEEDED FOR WHEEZING OR SHORTNESS OF BREATH   amiodarone 200 MG tablet Commonly known as:  PACERONE Take 400 mg (two tabs) twice daily for 5 more days, then take 200 mg (1 tab) daily   amitriptyline 25 MG tablet Commonly known as: ELAVIL Take 1 tablet (25 mg total) by mouth at bedtime.   amLODipine 5 MG tablet Commonly known as: NORVASC Take 1 tablet (5 mg total) by mouth daily. Start taking on: March 10, 2019 What changed:   medication strength  how much to take   apixaban 5 MG Tabs tablet Commonly known as: ELIQUIS Take 1 tablet (5 mg total) by mouth 2 (two) times daily.   atorvastatin 40 MG tablet Commonly known as: LIPITOR Take 1 tablet (40 mg total) by mouth daily.   Breo Ellipta 100-25 MCG/INH Aepb Generic drug: fluticasone furoate-vilanterol Inhale 1 puff into the lungs daily.   calcitRIOL 0.5 MCG capsule Commonly known as: ROCALTROL Take 0.5 mcg by mouth daily.   cetirizine 10 MG tablet Commonly known as: ZYRTEC Take 10 mg by mouth daily.   HUMULIN R 500 UNIT/ML injection Generic drug: insulin regular human CONCENTRATED Inject up to 25 units twice a day, or as directed by physician   hydrALAZINE 25 MG tablet Commonly known as: APRESOLINE TAKE 1 TABLET  THREE TIMES A DAY   ipratropium-albuterol 0.5-2.5 (3) MG/3ML Soln Commonly known as: DUONEB INHALE 3 ML BY NEBULIZATION EVERY 4 HOURS AS NEEDED   levothyroxine 100 MCG tablet Commonly known as: SYNTHROID Take 1 tablet (100 mcg total) by mouth daily.   metoprolol tartrate 50 MG tablet Commonly known as: LOPRESSOR Take 1 tablet (50 mg total) by mouth 2 (two) times daily.   pantoprazole 40 MG tablet Commonly known as: PROTONIX TAKE 1 TABLET TWICE A DAY   Pulse Oximeter For Finger Misc 1 Device by Does not apply route daily as needed.   testosterone cypionate 200 MG/ML injection Commonly known as: Depo-Testosterone Inject 1 mL (200 mg total) into the muscle every 14 (fourteen) days.   torsemide 20 MG tablet Commonly known as: DEMADEX Take 2 tablets (40 mg total) by mouth 2 (two)  times daily. What changed: when to take this   Uloric 80 MG Tabs Generic drug: Febuxostat Take 80 mg by mouth daily.   vitamin B-12 1000 MCG tablet Commonly known as: CYANOCOBALAMIN Take 1,000 mcg by mouth daily.   vitamin C 1000 MG tablet Take 1,000 mg by mouth 2 (two) times daily.      Follow-up Information    [name], MD. Go on 03/23/2019.   Specialty: Family Medicine Why: Appointment at [facility] information: [address] [place] [address] [id number]        Yolonda Kida, MD. Go on 03/19/2019.   Specialties: Cardiology, Internal Medicine Why: Appt at 3:15 pm to be see Dr. Clayborn Bigness to follow up on your atrial fibrillation, high blood pressure and heart failure  Contact information: [place] [address] [phone]        Murlean Iba, MD. Go on 03/15/2019.   Specialty: Nephrology Why: Appt at 11:20 for follow up. You will need repeat lab work (BMP) to check your kidney function.  Contact information: [place] [address] [phone]        Dr. Chesley Mires. Go on 04/02/2019.   Why: Appt at 2:30 PM to follow up for sleep apnea. Contact information: [facility]  [place], [place] [address]  Phone - [phone]       [facility] Follow up on 04/04/2019.   Specialty: Cardiology Why: @ 11:30 a.m.   Follow-up appointment with Darylene Price, FNP in the [facility].   Please enter through the [address].   Contact information: [place] [address] [place] [phone]       [facility] Cardiac and Pulmonary Rehab Follow up.   Specialty: Cardiac Rehabilitation Why: Your Cardiologist has referred you to outpatient Pulmonary Rehab at [facility].  The Pulmonary Rehab ([facility]) will contact you by phone within one to two weeks of discharge.    Contact information: [place] [id number] [place] [address] [phone]         Allergies  Allergen Reactions  . Guaifenesin Swelling    Throat swelling, increased heart rate.  . Mucinex  [Guaifenesin Er]     Throat swelling, increases heart rate  . Levaquin [Levofloxacin] Palpitations    Consultations:  Nephrology  Pulmonary  Cardiology   Procedures/Studies: Dg Chest 2 View  Result Date: 03/03/2019 CLINICAL DATA:  Shortness of breath. Pneumonia. Congestive heart failure and chronic kidney disease. EXAM: CHEST - 2 VIEW COMPARISON:  03/01/2019 FINDINGS: The heart size  and mediastinal contours are within normal limits. New asymmetric airspace disease is seen in the right mid and lower lung, suspicious for pneumonia. Left lung remains clear. No evidence of pleural effusion. IMPRESSION: New asymmetric airspace disease in right mid and lower lung, suspicious for pneumonia. Electronically Signed   By: Marlaine Hind M.D.   On: 03/03/2019 17:32   Dg Chest 2 View  Result Date: 03/01/2019 CLINICAL DATA:  Shortness of breath and productive cough for the past week. EXAM: CHEST - 2 VIEW COMPARISON:  CT chest dated August 03, 2018. Chest x-ray dated June 25, 2018. FINDINGS: The heart is at the upper limits of normal in size. Normal pulmonary vascularity. No focal consolidation, pleural effusion, or pneumothorax. Unchanged scarring in the right upper lobe along the minor fissure. No acute osseous abnormality. IMPRESSION: No active cardiopulmonary disease. Electronically Signed   By: Titus Dubin M.D.   On: 03/01/2019 18:09   Ct Chest Wo Contrast  Result Date: 03/01/2019 CLINICAL DATA:  Chronic dyspnea EXAM: CT CHEST WITHOUT CONTRAST TECHNIQUE: Multidetector CT imaging of the chest was performed following the standard protocol without IV contrast. COMPARISON:  08/03/2018 FINDINGS: Cardiovascular: Somewhat limited due to lack of IV contrast. Aortic  calcifications are seen without aneurysmal dilatation. No significant cardiac enlargement is noted. Coronary calcifications are seen. The pulmonary artery does not appear significantly enlarged. Mediastinum/Nodes: Thoracic inlet is within normal limits. Scattered small hilar and mediastinal lymph nodes are seen. Prominent subcarinal lymph node is noted measuring 18 mm in short axis. Esophagus as visualized is within normal limits. Lungs/Pleura: Patchy acute pneumonia is noted within the right lung primarily within the right lower lobe but to a lesser degree within the right upper lobe along the fissure. The left lung demonstrates some patchy opacities as well but to a lesser degree than that on the right. No effusion or pneumothorax is noted. Upper Abdomen: Visualized upper abdomen shows changes of prior cholecystectomy. No other focal abnormality is seen. Musculoskeletal: Degenerative changes of the thoracic spine are noted. No acute bony abnormality is seen. IMPRESSION: Patchy multifocal parenchymal opacities within both lungs right greater than left consistent with multifocal pneumonia. Aortic Atherosclerosis (ICD10-I70.0). Electronically Signed   By: Inez Catalina M.D.   On: 03/01/2019 19:18   Dg Chest Port 1 View  Result Date: 03/07/2019 CLINICAL DATA:  Pulmonary infiltrate EXAM: PORTABLE CHEST 1 VIEW COMPARISON:  03/04/2019 FINDINGS: Significant interval improvement in heterogeneous airspace opacity previously most conspicuous in the right lung. There remains pulmonary vascular prominence. Cardiomegaly. IMPRESSION: 1. Significant interval improvement in heterogeneous airspace opacity previously most conspicuous in the right lung, consistent with nearly resolved edema. There remains pulmonary vascular prominence. 2.  Cardiomegaly. Electronically Signed   By: Eddie Candle M.D.   On: 03/07/2019 09:41   Dg Chest Port 1 View  Result Date: 03/04/2019 CLINICAL DATA:  Respiratory distress EXAM: PORTABLE CHEST 1  VIEW COMPARISON:  March 03, 2019 FINDINGS: Increasing interstitial opacities throughout the left lung. More focal opacities are developing in the right base and right perihilar region. Cardiomegaly. The hila and mediastinum are unchanged. No pneumothorax. No other changes. IMPRESSION: 1. Developing focal infiltrate in the right perihilar region and right base worrisome for an infectious process/pneumonia. Mild increased interstitial markings in the left may represent pulmonary venous congestion. Electronically Signed   By: Dorise Bullion III M.D   On: 03/04/2019 19:06   11/3 echocardiogram IMPRESSIONS    1. Left ventricular ejection fraction, by visual estimation, is 55 to 60%. The  left ventricle has normal function. There is mildly increased left ventricular hypertrophy.  2. Global right ventricle has normal systolic function.The right ventricular size is normal. No increase in right ventricular wall thickness.  3. Left atrial size was normal.  4. Right atrial size was normal.  5. The mitral valve is normal in structure. Mild mitral valve regurgitation.  6. The tricuspid valve is normal in structure. Tricuspid valve regurgitation is trivial.  7. The aortic valve is normal in structure. Aortic valve regurgitation is not visualized.  8. The pulmonic valve was grossly normal. Pulmonic valve regurgitation is not visualized.    Subjective: Feeling better, swelling is resolved, he has no dyspnea, orthopnea, PND, sputum, fever, cough, chest pain, palpitations.  Discharge Exam: Vitals:   03/09/19 0734 03/09/19 0744  BP:  (!) 145/77  Pulse:  67  Resp:  17  Temp:  98 F (36.7 C)  SpO2: 93% 100%   Vitals:   03/08/19 2249 03/09/19 0543 03/09/19 0734 03/09/19 0744  BP: (!) 160/77 (!) 143/62  (!) 145/77  Pulse: 70 72  67  Resp: 20 20  17   Temp: 98.5 F (36.9 C) 98.1 F (36.7 C)  98 F (36.7 C)  TempSrc: Oral Oral  Oral  SpO2: 95% 96% 93% 100%  Weight: 123.1 kg     Height: 5\' 11"   (1.803 m)       General: Pt is alert, awake, not in acute distress, sitting in chair Cardiovascular: RRR, nl S1-S2, no murmurs appreciated.   1+ LE edema.   Respiratory: Normal respiratory rate and rhythm.  CTAB without rales or wheezes. Abdominal: Abdomen soft and non-tender.  No distension or HSM.   Neuro/Psych: Strength symmetric in upper and lower extremities.  Judgment and insight appear normal.   The results of significant diagnostics from this hospitalization (including imaging, microbiology, ancillary and laboratory) are listed below for reference.     Microbiology: Recent Results (from the past 240 hour(s))  SARS CORONAVIRUS 2 (TAT 6-24 HRS) Nasopharyngeal Nasopharyngeal Swab     Status: None   Collection Time: 03/01/19  7:27 PM   Specimen: Nasopharyngeal Swab  Result Value Ref Range Status   SARS Coronavirus 2 NEGATIVE NEGATIVE Final    Comment: (NOTE) SARS-CoV-2 target nucleic acids are NOT DETECTED. The SARS-CoV-2 RNA is generally detectable in upper and lower respiratory specimens during the acute phase of infection. Negative results do not preclude SARS-CoV-2 infection, do not rule out co-infections with other pathogens, and should not be used as the sole basis for treatment or other patient management decisions. Negative results must be combined with clinical observations, patient history, and epidemiological information. The expected result is Negative. Fact Sheet for Patients: SugarRoll.be Fact Sheet for Healthcare Providers: https://www.woods-mathews.com/ This test is not yet approved or cleared by the [place] FDA and  has been authorized for detection and/or diagnosis of SARS-CoV-2 by FDA under an Emergency Use Authorization (EUA). This EUA will remain  in effect (meaning this test can be used) for the duration of the COVID-19 declaration under Section 56 4(b)(1) of the Act, 21 U.S.C. section 360bbb-3(b)(1),  unless the authorization is terminated or revoked sooner. Performed at [facility], [place] [address], [place], [place] [address]   MRSA PCR Screening     Status: None   Collection Time: 03/04/19  9:50 AM   Specimen: Nasopharyngeal  Result Value Ref Range Status   MRSA by PCR NEGATIVE NEGATIVE Final    Comment:  The GeneXpert MRSA Assay (FDA approved for NASAL specimens only), is one component of a comprehensive MRSA colonization surveillance program. It is not intended to diagnose MRSA infection nor to guide or monitor treatment for MRSA infections. Performed at [facility], [address] [place], [place] [address]      Labs: BNP (last 3 results) Recent Labs    06/21/18 0140 03/04/19 1724  BNP 244.0* [id number]*   Basic Metabolic Panel: Recent Labs  Lab 03/04/19 0548 03/05/19 0419 03/05/19 0602 03/06/19 0337 03/07/19 0338 03/08/19 0525 03/09/19 0522  NA 139  --  137 140 139 139 139  K 4.3  --  4.2 4.5 4.2 4.6 4.3  CL 106  --  104 103 101 101 102  CO2 23  --  23 26 26 26 24   GLUCOSE 144*  --  215* 192* 148* 222* 162*  BUN 93*  --  93* 90* 97* 95* 105*  CREATININE 4.11*  --  4.08* 4.03* 4.11* 4.21* 4.57*  CALCIUM 8.3*  --  8.5* 9.0 9.3 10.0 9.4  MG 2.0  --  2.1 2.1  --   --   --   PHOS  --  5.2*  --   --  6.1*  --  7.1*   Liver Function Tests: Recent Labs  Lab 03/05/19 0602  AST 13*  ALT 16  ALKPHOS 57  BILITOT 1.0  PROT 7.1  ALBUMIN 3.3*   No results for input(s): LIPASE, AMYLASE in the last 168 hours. No results for input(s): AMMONIA in the last 168 hours. CBC: Recent Labs  Lab 03/04/19 0548 03/05/19 0419 03/07/19 0338 03/08/19 0525 03/09/19 0522  WBC 9.3 7.2 6.6 7.4 7.4  NEUTROABS 7.9*  --   --   --   --   HGB 8.5* 8.1* 8.5* 9.5* 8.9*  HCT 26.5* 24.8* 25.8* 28.4* 27.4*  MCV 91.7 88.6 88.1 87.1 88.1  PLT 123* 115* 149* 147* 167   Cardiac Enzymes: No results for input(s): CKTOTAL, CKMB, CKMBINDEX, TROPONINI in the  last 168 hours. BNP: Invalid input(s): POCBNP CBG: Recent Labs  Lab 03/08/19 1105 03/08/19 1602 03/08/19 2131 03/09/19 0746 03/09/19 1143  GLUCAP 339* 210* 205* 175* 262*   D-Dimer No results for input(s): DDIMER in the last 72 hours. Hgb A1c No results for input(s): HGBA1C in the last 72 hours. Lipid Profile No results for input(s): CHOL, HDL, LDLCALC, TRIG, CHOLHDL, LDLDIRECT in the last 72 hours. Thyroid function studies No results for input(s): TSH, T4TOTAL, T3FREE, THYROIDAB in the last 72 hours.  Invalid input(s): FREET3 Anemia work up No results for input(s): VITAMINB12, FOLATE, FERRITIN, TIBC, IRON, RETICCTPCT in the last 72 hours. Urinalysis    Component Value Date/Time   COLORURINE STRAW (A) 11/19/2017 1437   APPEARANCEUR CLEAR (A) 11/19/2017 1437   APPEARANCEUR Clear 03/13/2012 1011   LABSPEC 1.008 11/19/2017 1437   LABSPEC 1.015 03/13/2012 1011   PHURINE 5.0 11/19/2017 1437   GLUCOSEU NEGATIVE 11/19/2017 1437   GLUCOSEU Negative 03/13/2012 [place] 11/19/2017 1437   BILIRUBINUR NEGATIVE 11/19/2017 1437   BILIRUBINUR Negative 03/13/2012 [place] 11/19/2017 1437   PROTEINUR 30 (A) 11/19/2017 1437   NITRITE NEGATIVE 11/19/2017 1437   LEUKOCYTESUR NEGATIVE 11/19/2017 1437   LEUKOCYTESUR Negative 03/13/2012 1011   Sepsis Labs Invalid input(s): PROCALCITONIN,  WBC,  LACTICIDVEN Microbiology Recent Results (from the past 240 hour(s))  SARS CORONAVIRUS 2 (TAT 6-24 HRS) Nasopharyngeal Nasopharyngeal Swab     Status: None  Collection Time: 03/01/19  7:27 PM   Specimen: Nasopharyngeal Swab  Result Value Ref Range Status   SARS Coronavirus 2 NEGATIVE NEGATIVE Final    Comment: (NOTE) SARS-CoV-2 target nucleic acids are NOT DETECTED. The SARS-CoV-2 RNA is generally detectable in upper and lower respiratory specimens during the acute phase of infection. Negative results do not preclude SARS-CoV-2 infection, do not rule  out co-infections with other pathogens, and should not be used as the sole basis for treatment or other patient management decisions. Negative results must be combined with clinical observations, patient history, and epidemiological information. The expected result is Negative. Fact Sheet for Patients: SugarRoll.be Fact Sheet for Healthcare Providers: https://www.woods-mathews.com/ This test is not yet approved or cleared by the [place] FDA and  has been authorized for detection and/or diagnosis of SARS-CoV-2 by FDA under an Emergency Use Authorization (EUA). This EUA will remain  in effect (meaning this test can be used) for the duration of the COVID-19 declaration under Section 56 4(b)(1) of the Act, 21 U.S.C. section 360bbb-3(b)(1), unless the authorization is terminated or revoked sooner. Performed at [facility], [place] [address], [place], [place] [address]   MRSA PCR Screening     Status: None   Collection Time: 03/04/19  9:50 AM   Specimen: Nasopharyngeal  Result Value Ref Range Status   MRSA by PCR NEGATIVE NEGATIVE Final    Comment:        The GeneXpert MRSA Assay (FDA approved for NASAL specimens only), is one component of a comprehensive MRSA colonization surveillance program. It is not intended to diagnose MRSA infection nor to guide or monitor treatment for MRSA infections. Performed at [facility], [address] [place], [place] [address]      Time coordinating discharge: 35 minutes       SIGNED:   Edwin Dada, MD  [facility] 03/09/2019, 8:07 PM

## 2019-03-09 NOTE — Progress Notes (Signed)
Patient discharged to home with family.  Tele and IV d/c'd prior to discharge. Patient verbalizes understanding of discharge instructions.

## 2019-03-09 NOTE — Progress Notes (Signed)
[facility] [place] 03/09/19  Subjective:  Renal parameters have been worsening slowly. BUN up to 105 with a creatinine of 4.57 and EGFR down to 13. Case discussed with Dr. Loleta Books this a.m. Patient to be switched over to torsemide.  Objective:  Vital signs in last 24 hours:  Temp:  [97.5 F (36.4 C)-98.5 F (36.9 C)] 98 F (36.7 C) (11/06 0744) Pulse Rate:  [67-86] 67 (11/06 0744) Resp:  [10-20] 17 (11/06 0744) BP: (104-173)/(62-88) 145/77 (11/06 0744) SpO2:  [90 %-100 %] 100 % (11/06 0744) Weight:  [123.1 kg] 123.1 kg (11/05 2249)  Weight change:  Filed Weights   03/01/19 1725 03/04/19 0947 03/08/19 2249  Weight: 133.4 kg 135.3 kg 123.1 kg    Intake/Output:    Intake/Output Summary (Last 24 hours) at 03/09/2019 0940 Last data filed at 03/09/2019 0748 Gross per 24 hour  Intake 240 ml  Output 2675 ml  Net -2435 ml    Gen:   No acute distress Head:   Normocephalic, atraumatic Eyes/ENT:  Conjunctiva clear,  moist oral mucus membranes Neck:  Supple Lungs:   Basilar rales, normal effort Heart:   Irregular  Abdomen:   Soft, non-tender, bowel sounds active  Extremities: 1+ bilateral lower extremity edema Skin:  Skin warm, turgor normal, no rashes or lesions Neurologic: Alert and oriented, able to answer questions appropriately     Basic Metabolic Panel:  Recent Labs  Lab 03/04/19 0548 03/05/19 0419 03/05/19 0602 03/06/19 0337 03/07/19 0338 03/08/19 0525 03/09/19 0522  NA 139  --  137 140 139 139 139  K 4.3  --  4.2 4.5 4.2 4.6 4.3  CL 106  --  104 103 101 101 102  CO2 23  --  '23 26 26 26 24  ' GLUCOSE 144*  --  215* 192* 148* 222* 162*  BUN 93*  --  93* 90* 97* 95* 105*  CREATININE 4.11*  --  4.08* 4.03* 4.11* 4.21* 4.57*  CALCIUM 8.3*  --  8.5* 9.0 9.3 10.0 9.4  MG 2.0  --  2.1 2.1  --   --   --   PHOS  --  5.2*  --   --  6.1*  --  7.1*     CBC: Recent Labs  Lab 03/04/19 0548 03/05/19 0419 03/07/19 0338  03/08/19 0525 03/09/19 0522  WBC 9.3 7.2 6.6 7.4 7.4  NEUTROABS 7.9*  --   --   --   --   HGB 8.5* 8.1* 8.5* 9.5* 8.9*  HCT 26.5* 24.8* 25.8* 28.4* 27.4*  MCV 91.7 88.6 88.1 87.1 88.1  PLT 123* 115* 149* 147* 167      Lab Results  Component Value Date   HEPBSAG Negative 11/20/2017   HEPBSAB Non Reactive 11/20/2017   HEPBIGM Negative 11/20/2017      Microbiology:  Recent Results (from the past 240 hour(s))  SARS CORONAVIRUS 2 (TAT 6-24 HRS) Nasopharyngeal Nasopharyngeal Swab     Status: None   Collection Time: 03/01/19  7:27 PM   Specimen: Nasopharyngeal Swab  Result Value Ref Range Status   SARS Coronavirus 2 NEGATIVE NEGATIVE Final    Comment: (NOTE) SARS-CoV-2 target nucleic acids are NOT DETECTED. The SARS-CoV-2 RNA is generally detectable in upper and lower respiratory specimens during the acute phase of infection. Negative results do not preclude SARS-CoV-2 infection, do not rule out co-infections with other pathogens, and should not be used as the sole basis for treatment or other patient management decisions. Negative results  must be combined with clinical observations, patient history, and epidemiological information. The expected result is Negative. Fact Sheet for Patients: SugarRoll.be Fact Sheet for Healthcare Providers: https://www.woods-mathews.com/ This test is not yet approved or cleared by the [place] FDA and  has been authorized for detection and/or diagnosis of SARS-CoV-2 by FDA under an Emergency Use Authorization (EUA). This EUA will remain  in effect (meaning this test can be used) for the duration of the COVID-19 declaration under Section 56 4(b)(1) of the Act, 21 U.S.C. section 360bbb-3(b)(1), unless the authorization is terminated or revoked sooner. Performed at [facility], [place] [address] [place], [place] [address]   MRSA PCR Screening     Status: None   Collection Time: 03/04/19   9:50 AM   Specimen: Nasopharyngeal  Result Value Ref Range Status   MRSA by PCR NEGATIVE NEGATIVE Final    Comment:        The GeneXpert MRSA Assay (FDA approved for NASAL specimens only), is one component of a comprehensive MRSA colonization surveillance program. It is not intended to diagnose MRSA infection nor to guide or monitor treatment for MRSA infections. Performed at [facility], [address] [place], [place] [address]     Coagulation Studies: No results for input(s): LABPROT, INR in the last 72 hours.  Urinalysis: No results for input(s): COLORURINE, LABSPEC, PHURINE, GLUCOSEU, HGBUR, BILIRUBINUR, KETONESUR, PROTEINUR, UROBILINOGEN, NITRITE, LEUKOCYTESUR in the last 72 hours.  Invalid input(s): APPERANCEUR    Imaging: No results found.   Medications:   . sodium chloride 250 mL (03/05/19 0850)   . amiodarone  400 mg Oral BID  . amitriptyline  25 mg Oral QHS  . amLODipine  5 mg Oral Daily  . apixaban  5 mg Oral BID  . atorvastatin  40 mg Oral Daily  . calcitRIOL  0.5 mcg Oral Daily  . Chlorhexidine Gluconate Cloth  6 each Topical Daily  . fluticasone furoate-vilanterol  1 puff Inhalation Daily  . hydrALAZINE  25 mg Oral TID  . insulin aspart  0-15 Units Subcutaneous TID WC  . insulin aspart  0-5 Units Subcutaneous QHS  . insulin regular human CONCENTRATED  30 Units Subcutaneous BID WC  . ipratropium-albuterol  3 mL Nebulization TID  . levothyroxine  100 mcg Oral Daily  . loratadine  10 mg Oral Daily  . mouth rinse  15 mL Mouth Rinse BID  . metoprolol tartrate  50 mg Oral BID  . pantoprazole  40 mg Oral BID  . pneumococcal 23 valent vaccine  0.5 mL Intramuscular Tomorrow-1000  . polyethylene glycol  17 g Oral Daily  . senna  1 tablet Oral BID  . sevelamer carbonate  800 mg Oral TID WC  . sodium chloride flush  3 mL Intravenous Q12H  . torsemide  40 mg Oral BID  . 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 CKD st 5.  Serum creatinine trends are summarized below Recent Labs    03/06/19 0337 03/07/19 0338 03/08/19 0525 03/09/19 0522  CREATININE 4.03* 4.11* 4.21* 4.57*  -Creatinine continues to slowly rise.  BUN up to 105 with a creatinine of 4.57.  Urine output was 2.4 L over the preceding 24 hours.  Patient being switched over to oral diuretics at the moment.  He will need continued monitoring of his renal parameters as an outpatient by my partner Dr. [name].  #. Anemia of CKD  Lab Results  Component Value Date   HGB 8.9 (L) 03/09/2019  Patient will require continued monitoring of CBC as an outpatient.  We will need to consider Epogen through hematology as an outpatient.  #. SHPTH  No results found for: PTH Lab Results  Component Value Date   PHOS 7.1 (H) 03/09/2019  Increase Renvela to 2 tablets p.o. 3 times daily with meals.  #. HTN with CKD Continue hydralazine and metoprolol for blood pressure control.  #. Diabetes type 2 with CKD Hemoglobin A1C (%)  Date  Value  03/14/2012 5.0   Hgb A1c MFr Bld (%)  Date Value  03/02/2019 6.1 (H)  A1c currently at target.  Glycemic control as per hospitalist.    #Shortness of breath, multifocal pneumonia -Patient treated with Augmentin for multifocal pneumonia.  #Atrial fibrillation with rapid ventricular response -Patient on amiodarone as well as metoprolol for rate control.   LOS: 8 Patrick Hurst 11/6/20209:40 AM  [place], [place]

## 2019-03-09 NOTE — Progress Notes (Signed)
Pharmacy Electrolyte Monitoring Consult:  Patrick Hurst is a 77 YOM admitted on 03/01/2019 with shortness of breath and cough. PMH includes: CKD stage IV, CHF, DM, obstructive sleep apnea on CPAP. Patient presented in atrial fibrillation with RVR, which resolved within 48 hours of admission. He completed 7 days of abx for multifocal PNA.  Patient is currently on Lasix 80mg  IV BID and is not receiving IV fluids.  Pharmacy has been consulted to assist in monitoring and replacing electrolytes.  Labs:  Sodium (mmol/L)  Date Value  03/09/2019 139  04/04/2018 145 (H)  05/04/2013 137   Potassium (mmol/L)  Date Value  03/09/2019 4.3  05/04/2013 4.4   Magnesium (mg/dL)  Date Value  03/06/2019 2.1  03/14/2012 1.9   Phosphorus (mg/dL)  Date Value  03/09/2019 7.1 (H)   Calcium (mg/dL)  Date Value  03/09/2019 9.4   Calcium, Total (mg/dL)  Date Value  05/04/2013 9.1   Albumin (g/dL)  Date Value  03/05/2019 3.3 (L)  04/04/2018 4.3  05/04/2013 3.3 (L)   Corrected Calcium: 10.0 mg/dL  Assessment/Plan: - patient receiving sevelamer 800mg  TID for hyperphosphatemia - no other electrolyte replacements warranted for multiple days   - will defer to Nephrology for further management of hyperphosphatemia and pharmacy will sign off at this time   Lu Duffel, PharmD, BCPS Clinical Pharmacist 03/09/2019 7:37 AM

## 2019-03-09 NOTE — Progress Notes (Signed)
Cardiovascular and Pulmonary Nurse Navigator Note:    63 year old male with chronic kidney disease stage IV, obstructive sleep apnea, chronic edema, cirrhosis of the liver, proteinuria, secondary hyperparathyroidism, morbid obesity, and DM.  Patient admitted with acute hypoxemic respiratory failure.   Active Problems this admission:     Diabetes mellitus with neuropathy (HCC)   Hypothyroidism   Thrombocytopenia (HCC)   Obesity, Class III, BMI 40-49.9 (morbid obesity) (HCC)   Acute respiratory failure with hypoxia (HCC)   Acute on chronic diastolic CHF (congestive heart failure) (HCC)   COPD (chronic obstructive pulmonary disease) (HCC)   Multifocal pneumonia   CKD (chronic kidney disease) stage 5, GFR less than 15 ml/min (HCC)   Atrial fibrillation with RVR (HCC)   Elevated troponin  EDUCATION:   This RN rounded on patient to discuss whether patient is conducting daily weights at home.  Reviewed when to call the doctor / Cardiologist / Nephrologist / Heart Failure Clinic.  Patient instructed to call MD for weight gain of 2 pounds overnight or 5 pounds in one week, worsening shortness of breath, fluid buildup on belly - legs - ankle and feet - chest discomfort - increasing fatigue.  Patient reporting that he has a functioning scale and weighs himself daily.  Patient is followed in the [facility].  Reviewed all the appointments scheduled for patient.  This RN stressed the importance of keeping each appointment.    Mentioned to patient and wife Occupational Therapy has recommended in-home OT.  Patient confused as to why this was recommended as physical therapy had no recommendations for in-home care.  Patient refusing home health services.    Discussed with patient and wife that patient is a candidate for outpatient Pulmonary Rehab given his diagnosis of chronic diastolic congestive heart failure, obstructive sleep apnea, and chronic obstructive pulmonary disease.  Overview of the  program provided.  Informational letter with CPT billing codes and brochure given to patient.  Patient interested in participating in Pulmonary Rehab and was wanting to know why this has not been offered to him before now.  Explained to patient and wife the Pulmonary Rehab dept will contact them by phone in one to two weeks after discharge to schedule the first appointment.  The first appointment would be a Telephone Call for RN assessment.  The RN during that call would then set up the next appointment for patient to come in and meet with Exercise Physiologist and Dietitian.  Directions to Pulmonary Rehab dept provided.  Patient was instructed to enter through the [facility].  Patient and wife verbalized understanding.    Patient and wife appreciative of the above information.    Roanna Epley, RN, BSN, [facility]  Cardiovascular & Pulmonary Nurse Navigator  Direct Line: [phone]  Department Phone #: [phone] Fax: [phone]  Email Address: [email or web address]

## 2019-03-09 NOTE — TOC Transition Note (Signed)
Transition of Care [facility]) - CM/SW Discharge Note   Patient Details  Name: Patrick Hurst MRN: [id number] Date of Birth: 02/16/1956  [facility] CM/SW Contact:  Ross Ludwig, LCSW Phone Number: 03/09/2019, 1:02 PM   Clinical Narrative:     CSW was informed that patient needed assistance paying for his Eliquis, CSW printed coupon for patient and gave to the bedside nurse.  Patient is refusing home health, CSW signing off, please reconsult if social work needs arise.   Final next level of care: Home/Self Care Barriers to Discharge: Barriers Resolved   Patient Goals and CMS Choice   CMS Medicare.gov Compare Post Acute Care list provided to:: Patient Choice offered to / list presented to : Patient  Discharge Placement  Patient will be discharging back home, he is refusing SNF placement.                Discharge Plan and Services  Patient will discharging back home.              DME Arranged: N/A DME Agency: NA       HH Arranged: NA          Social Determinants of Health (SDOH) Interventions     Readmission Risk Interventions No flowsheet data found.

## 2019-03-12 ENCOUNTER — Telehealth: Payer: Self-pay

## 2019-03-12 NOTE — Telephone Encounter (Signed)
Transition Care Management Follow-up Telephone Call  Date of discharge and from where: [facility] on 03/09/19  How have you been since you were released from the hospital? Doing better but still lightheaded at times. Declines SOB, pain, fever or n/v/d.  Any questions or concerns? No   Items Reviewed:  Did the pt receive and understand the discharge instructions provided? Yes   Medications obtained and verified? Declined reviewing medications over the phone.   Any new allergies since your discharge? No   Dietary orders reviewed? Yes  Do you have support at home? Yes   Other (ie: DME, Home Health, etc) N/A  Functional Questionnaire: (I = Independent and D = Dependent)  Bathing/Dressing- I   Meal Prep- I  Eating- I  Maintaining continence- I  Transferring/Ambulation- I  Managing Meds- I   Follow up appointments reviewed:    PCP Hospital f/u appt confirmed? Yes  Scheduled to see Dr Caryn Section on 03/23/19 @ 3:00 PM.  [facility] f/u appt confirmed? Yes    Are transportation arrangements needed? No   If their condition worsens, is the pt aware to call  their PCP or go to the ED? Yes  Was the patient provided with contact information for the PCP's office or ED? Yes  Was the pt encouraged to call back with questions or concerns? Yes

## 2019-03-12 NOTE — Telephone Encounter (Signed)
HFU scheduled 03/23/19.

## 2019-03-19 ENCOUNTER — Encounter: Payer: Commercial Managed Care - PPO | Attending: Internal Medicine

## 2019-03-19 ENCOUNTER — Other Ambulatory Visit: Payer: Self-pay

## 2019-03-19 DIAGNOSIS — E039 Hypothyroidism, unspecified: Secondary | ICD-10-CM | POA: Insufficient documentation

## 2019-03-19 DIAGNOSIS — Z79890 Hormone replacement therapy (postmenopausal): Secondary | ICD-10-CM | POA: Insufficient documentation

## 2019-03-19 DIAGNOSIS — E785 Hyperlipidemia, unspecified: Secondary | ICD-10-CM | POA: Insufficient documentation

## 2019-03-19 DIAGNOSIS — F329 Major depressive disorder, single episode, unspecified: Secondary | ICD-10-CM | POA: Insufficient documentation

## 2019-03-19 DIAGNOSIS — Z79899 Other long term (current) drug therapy: Secondary | ICD-10-CM | POA: Insufficient documentation

## 2019-03-19 DIAGNOSIS — N189 Chronic kidney disease, unspecified: Secondary | ICD-10-CM | POA: Insufficient documentation

## 2019-03-19 DIAGNOSIS — I509 Heart failure, unspecified: Secondary | ICD-10-CM | POA: Insufficient documentation

## 2019-03-19 DIAGNOSIS — I130 Hypertensive heart and chronic kidney disease with heart failure and stage 1 through stage 4 chronic kidney disease, or unspecified chronic kidney disease: Secondary | ICD-10-CM | POA: Insufficient documentation

## 2019-03-19 DIAGNOSIS — Z7901 Long term (current) use of anticoagulants: Secondary | ICD-10-CM | POA: Insufficient documentation

## 2019-03-19 DIAGNOSIS — G4733 Obstructive sleep apnea (adult) (pediatric): Secondary | ICD-10-CM

## 2019-03-19 DIAGNOSIS — Z87891 Personal history of nicotine dependence: Secondary | ICD-10-CM | POA: Insufficient documentation

## 2019-03-19 DIAGNOSIS — M109 Gout, unspecified: Secondary | ICD-10-CM | POA: Insufficient documentation

## 2019-03-19 DIAGNOSIS — E1122 Type 2 diabetes mellitus with diabetic chronic kidney disease: Secondary | ICD-10-CM | POA: Insufficient documentation

## 2019-03-19 DIAGNOSIS — Z794 Long term (current) use of insulin: Secondary | ICD-10-CM | POA: Insufficient documentation

## 2019-03-19 DIAGNOSIS — K219 Gastro-esophageal reflux disease without esophagitis: Secondary | ICD-10-CM | POA: Insufficient documentation

## 2019-03-19 NOTE — Progress Notes (Signed)
Virtual Orientation performed. Patient informed when to come in for RD and EP orientation. Diagnosis can be found in [facility] 01/22/2019.

## 2019-03-20 ENCOUNTER — Encounter: Payer: Commercial Managed Care - PPO | Admitting: *Deleted

## 2019-03-20 ENCOUNTER — Telehealth: Payer: Self-pay

## 2019-03-20 VITALS — Ht 70.6 in | Wt 278.6 lb

## 2019-03-20 DIAGNOSIS — Z79899 Other long term (current) drug therapy: Secondary | ICD-10-CM | POA: Diagnosis not present

## 2019-03-20 DIAGNOSIS — Z79890 Hormone replacement therapy (postmenopausal): Secondary | ICD-10-CM | POA: Diagnosis not present

## 2019-03-20 DIAGNOSIS — E1122 Type 2 diabetes mellitus with diabetic chronic kidney disease: Secondary | ICD-10-CM | POA: Diagnosis not present

## 2019-03-20 DIAGNOSIS — I509 Heart failure, unspecified: Secondary | ICD-10-CM | POA: Diagnosis not present

## 2019-03-20 DIAGNOSIS — G4733 Obstructive sleep apnea (adult) (pediatric): Secondary | ICD-10-CM | POA: Diagnosis not present

## 2019-03-20 DIAGNOSIS — M109 Gout, unspecified: Secondary | ICD-10-CM | POA: Diagnosis not present

## 2019-03-20 DIAGNOSIS — K219 Gastro-esophageal reflux disease without esophagitis: Secondary | ICD-10-CM | POA: Diagnosis not present

## 2019-03-20 DIAGNOSIS — I130 Hypertensive heart and chronic kidney disease with heart failure and stage 1 through stage 4 chronic kidney disease, or unspecified chronic kidney disease: Secondary | ICD-10-CM | POA: Diagnosis not present

## 2019-03-20 DIAGNOSIS — E785 Hyperlipidemia, unspecified: Secondary | ICD-10-CM | POA: Diagnosis not present

## 2019-03-20 DIAGNOSIS — Z7901 Long term (current) use of anticoagulants: Secondary | ICD-10-CM | POA: Diagnosis not present

## 2019-03-20 DIAGNOSIS — Z794 Long term (current) use of insulin: Secondary | ICD-10-CM | POA: Diagnosis not present

## 2019-03-20 DIAGNOSIS — F329 Major depressive disorder, single episode, unspecified: Secondary | ICD-10-CM | POA: Diagnosis not present

## 2019-03-20 DIAGNOSIS — N189 Chronic kidney disease, unspecified: Secondary | ICD-10-CM | POA: Diagnosis not present

## 2019-03-20 DIAGNOSIS — Z87891 Personal history of nicotine dependence: Secondary | ICD-10-CM | POA: Diagnosis not present

## 2019-03-20 DIAGNOSIS — E039 Hypothyroidism, unspecified: Secondary | ICD-10-CM | POA: Diagnosis not present

## 2019-03-20 NOTE — Telephone Encounter (Signed)
Forms received and placed on Dr. Maralyn Sago desk for review.

## 2019-03-20 NOTE — Patient Instructions (Signed)
Patient Instructions  Patient Details  Name: Patrick Hurst MRN: [id number] Date of Birth: 10-Apr-1956 Referring Provider:  Yolonda Kida, MD  Below are your personal goals for exercise, nutrition, and risk factors. Our goal is to help you stay on track towards obtaining and maintaining these goals. We will be discussing your progress on these goals with you throughout the program.  Initial Exercise Prescription: Initial Exercise Prescription - 03/20/19 1200      Date of Initial Exercise RX and Referring Provider   Date  03/20/19    Referring Provider  Lujean Amel MD      Treadmill   MPH  2    Grade  0    Minutes  15    METs  2.53      NuStep   Level  2    SPM  80    Minutes  15    METs  2.5      Recumbant Elliptical   Level  1    RPM  50    Minutes  15    METs  2.5      Prescription Details   Frequency (times per week)  3    Duration  Progress to 30 minutes of continuous aerobic without signs/symptoms of physical distress      Intensity   THRR 40-80% of Max Heartrate  104-141    Ratings of Perceived Exertion  11-13    Perceived Dyspnea  0-4      Progression   Progression  Continue to progress workloads to maintain intensity without signs/symptoms of physical distress.      Resistance Training   Training Prescription  Yes    Weight  3 lb    Reps  10-15       Exercise Goals: Frequency: Be able to perform aerobic exercise two to three times per week in program working toward 2-5 days per week of home exercise.  Intensity: Work with a perceived exertion of 11 (fairly light) - 15 (hard) while following your exercise prescription.  We will make changes to your prescription with you as you progress through the program.   Duration: Be able to do 30 to 45 minutes of continuous aerobic exercise in addition to a 5 minute warm-up and a 5 minute cool-down routine.   Nutrition Goals: Your personal nutrition goals will be established when you do your nutrition  analysis with the dietician.  The following are general nutrition guidelines to follow: Cholesterol < 200mg /day Sodium < 1500mg /day Fiber: Men over 50 yrs - 30 grams per day  Personal Goals: Personal Goals and Risk Factors at Admission - 03/20/19 1227      Core Components/Risk Factors/Patient Goals on Admission    Weight Management  Yes;Weight Loss;Obesity    Intervention  Weight Management: Develop a combined nutrition and exercise program designed to reach desired caloric intake, while maintaining appropriate intake of nutrient and fiber, sodium and fats, and appropriate energy expenditure required for the weight goal.;Weight Management: Provide education and appropriate resources to help participant work on and attain dietary goals.;Weight Management/Obesity: Establish reasonable short term and long term weight goals.;Obesity: Provide education and appropriate resources to help participant work on and attain dietary goals.    Admit Weight  278 lb 9.6 oz (126.4 kg)    Goal Weight: Short Term  273 lb (123.8 kg)    Goal Weight: Long Term  268 lb (121.6 kg)    Expected Outcomes  Short Term: Continue to assess and  modify interventions until short term weight is achieved;Long Term: Adherence to nutrition and physical activity/exercise program aimed toward attainment of established weight goal;Weight Loss: Understanding of general recommendations for a balanced deficit meal plan, which promotes 1-2 lb weight loss per week and includes a negative energy balance of [phone] kcal/d;Understanding recommendations for meals to include 15-35% energy as protein, 25-35% energy from fat, 35-60% energy from carbohydrates, less than 200mg  of dietary cholesterol, 20-35 gm of total fiber daily;Understanding of distribution of calorie intake throughout the day with the consumption of 4-5 meals/snacks    Improve shortness of breath with ADL's  Yes    Intervention  Provide education, individualized exercise plan and  daily activity instruction to help decrease symptoms of SOB with activities of daily living.    Expected Outcomes  Short Term: Improve cardiorespiratory fitness to achieve a reduction of symptoms when performing ADLs;Long Term: Be able to perform more ADLs without symptoms or delay the onset of symptoms    Diabetes  Yes    Intervention  Provide education about signs/symptoms and action to take for hypo/hyperglycemia.;Provide education about proper nutrition, including hydration, and aerobic/resistive exercise prescription along with prescribed medications to achieve blood glucose in normal ranges: Fasting glucose 65-99 mg/dL    Expected Outcomes  Short Term: Participant verbalizes understanding of the signs/symptoms and immediate care of hyper/hypoglycemia, proper foot care and importance of medication, aerobic/resistive exercise and nutrition plan for blood glucose control.;Long Term: Attainment of HbA1C < 7%.    Heart Failure  Yes    Intervention  Provide a combined exercise and nutrition program that is supplemented with education, support and counseling about heart failure. Directed toward relieving symptoms such as shortness of breath, decreased exercise tolerance, and extremity edema.    Expected Outcomes  Improve functional capacity of life;Short term: Attendance in program 2-3 days a week with increased exercise capacity. Reported lower sodium intake. Reported increased fruit and vegetable intake. Reports medication compliance.;Short term: Daily weights obtained and reported for increase. Utilizing diuretic protocols set by physician.;Long term: Adoption of self-care skills and reduction of barriers for early signs and symptoms recognition and intervention leading to self-care maintenance.    Hypertension  Yes    Intervention  Provide education on lifestyle modifcations including regular physical activity/exercise, weight management, moderate sodium restriction and increased consumption of fresh  fruit, vegetables, and low fat dairy, alcohol moderation, and smoking cessation.;Monitor prescription use compliance.    Expected Outcomes  Short Term: Continued assessment and intervention until BP is < 140/11mm HG in hypertensive participants. < 130/30mm HG in hypertensive participants with diabetes, heart failure or chronic kidney disease.;Long Term: Maintenance of blood pressure at goal levels.    Lipids  Yes    Intervention  Provide education and support for participant on nutrition & aerobic/resistive exercise along with prescribed medications to achieve LDL 70mg , HDL >40mg .    Expected Outcomes  Short Term: Participant states understanding of desired cholesterol values and is compliant with medications prescribed. Participant is following exercise prescription and nutrition guidelines.;Long Term: Cholesterol controlled with medications as prescribed, with individualized exercise RX and with personalized nutrition plan. Value goals: LDL < 70mg , HDL > 40 mg.       Tobacco Use Initial Evaluation: Social History   Tobacco Use  Smoking Status Former Smoker  . Packs/day: 1.00  . Years: 15.00  . Pack years: 15.00  . Types: Cigarettes  . Quit date: 05/03/1978  . Years since quitting: 40.9  Smokeless Tobacco Former Systems developer  Tobacco Comment  started smoking at age 71    Exercise Goals and Review: Exercise Goals    Row Name 03/20/19 1222             Exercise Goals   Increase Physical Activity  Yes       Intervention  Provide advice, education, support and counseling about physical activity/exercise needs.;Develop an individualized exercise prescription for aerobic and resistive training based on initial evaluation findings, risk stratification, comorbidities and participant's personal goals.       Expected Outcomes  Short Term: Attend rehab on a regular basis to increase amount of physical activity.;Long Term: Add in home exercise to make exercise part of routine and to increase amount of  physical activity.;Long Term: Exercising regularly at least 3-5 days a week.       Increase Strength and Stamina  Yes       Intervention  Provide advice, education, support and counseling about physical activity/exercise needs.;Develop an individualized exercise prescription for aerobic and resistive training based on initial evaluation findings, risk stratification, comorbidities and participant's personal goals.       Expected Outcomes  Short Term: Increase workloads from initial exercise prescription for resistance, speed, and METs.;Short Term: Perform resistance training exercises routinely during rehab and add in resistance training at home;Long Term: Improve cardiorespiratory fitness, muscular endurance and strength as measured by increased METs and functional capacity (6MWT)       Able to understand and use rate of perceived exertion (RPE) scale  Yes       Intervention  Provide education and explanation on how to use RPE scale       Expected Outcomes  Short Term: Able to use RPE daily in rehab to express subjective intensity level;Long Term:  Able to use RPE to guide intensity level when exercising independently       Knowledge and understanding of Target Heart Rate Range (THRR)  Yes       Intervention  Provide education and explanation of THRR including how the numbers were predicted and where they are located for reference       Expected Outcomes  Short Term: Able to state/look up THRR;Short Term: Able to use daily as guideline for intensity in rehab;Long Term: Able to use THRR to govern intensity when exercising independently       Able to check pulse independently  Yes       Intervention  Provide education and demonstration on how to check pulse in carotid and radial arteries.;Review the importance of being able to check your own pulse for safety during independent exercise       Expected Outcomes  Short Term: Able to explain why pulse checking is important during independent exercise;Long  Term: Able to check pulse independently and accurately       Understanding of Exercise Prescription  Yes       Intervention  Provide education, explanation, and written materials on patient's individual exercise prescription       Expected Outcomes  Short Term: Able to explain program exercise prescription;Long Term: Able to explain home exercise prescription to exercise independently          Copy of goals given to participant.

## 2019-03-20 NOTE — Progress Notes (Signed)
Pulmonary Individual Treatment Plan  Patient Details  Name: Patrick Hurst MRN: [id number] Date of Birth: 01/30/1956 Referring Provider:     Pulmonary Rehab from 03/20/2019 in [facility] Cardiac and Pulmonary Rehab  Referring Provider  Lujean Amel MD      Initial Encounter Date:    Pulmonary Rehab from 03/20/2019 in [facility] Cardiac and Pulmonary Rehab  Date  03/20/19      Visit Diagnosis: OSA (obstructive sleep apnea)  Patient's Home Medications on Admission:  Current Outpatient Medications:  .  albuterol (VENTOLIN HFA) 108 (90 Base) MCG/ACT inhaler, USE 2 INHALATIONS EVERY 4 HOURS AS NEEDED FOR WHEEZING OR SHORTNESS OF BREATH, Disp: 8.5 g, Rfl: 10 .  amiodarone (PACERONE) 200 MG tablet, Take 400 mg (two tabs) twice daily for 5 more days, then take 200 mg (1 tab) daily, Disp: 45 tablet, Rfl: 0 .  amitriptyline (ELAVIL) 25 MG tablet, Take 1 tablet (25 mg total) by mouth at bedtime., Disp: 90 tablet, Rfl: 3 .  amLODipine (NORVASC) 5 MG tablet, Take 1 tablet (5 mg total) by mouth daily., Disp: 30 tablet, Rfl: 3 .  apixaban (ELIQUIS) 5 MG TABS tablet, Take 1 tablet (5 mg total) by mouth 2 (two) times daily., Disp: 60 tablet, Rfl: 3 .  Ascorbic Acid (VITAMIN C) 1000 MG tablet, Take 1,000 mg by mouth 2 (two) times daily. , Disp: , Rfl:  .  atorvastatin (LIPITOR) 40 MG tablet, Take 1 tablet (40 mg total) by mouth daily., Disp: 90 tablet, Rfl: 4 .  calcitRIOL (ROCALTROL) 0.5 MCG capsule, Take 0.5 mcg by mouth daily., Disp: , Rfl:  .  cetirizine (ZYRTEC) 10 MG tablet, Take 10 mg by mouth daily. , Disp: , Rfl:  .  fluticasone furoate-vilanterol (BREO ELLIPTA) 100-25 MCG/INH AEPB, Inhale 1 puff into the lungs daily., Disp: 180 each, Rfl: 3 .  hydrALAZINE (APRESOLINE) 25 MG tablet, TAKE 1 TABLET THREE TIMES A DAY, Disp: 270 tablet, Rfl: 4 .  insulin regular human CONCENTRATED (HUMULIN R) 500 UNIT/ML injection, Inject up to 25 units twice a day, or as directed by physician, Disp: 60 mL, Rfl: 3 .   ipratropium-albuterol (DUONEB) 0.5-2.5 (3) MG/3ML SOLN, INHALE 3 ML BY NEBULIZATION EVERY 4 HOURS AS NEEDED, Disp: 360 mL, Rfl: 8 .  levothyroxine (SYNTHROID) 100 MCG tablet, Take 1 tablet (100 mcg total) by mouth daily., Disp: 90 tablet, Rfl: 3 .  metoprolol tartrate (LOPRESSOR) 50 MG tablet, Take 1 tablet (50 mg total) by mouth 2 (two) times daily., Disp: 60 tablet, Rfl: 3 .  Misc. Devices (PULSE OXIMETER FOR FINGER) MISC, 1 Device by Does not apply route daily as needed., Disp: 1 each, Rfl: 0 .  pantoprazole (PROTONIX) 40 MG tablet, TAKE 1 TABLET TWICE A DAY, Disp: 180 tablet, Rfl: 3 .  testosterone cypionate (DEPO-TESTOSTERONE) 200 MG/ML injection, Inject 1 mL (200 mg total) into the muscle every 14 (fourteen) days., Disp: 1 mL, Rfl: 5 .  torsemide (DEMADEX) 20 MG tablet, Take 2 tablets (40 mg total) by mouth 2 (two) times daily., Disp: 60 tablet, Rfl: 3 .  ULORIC 80 MG TABS, Take 80 mg by mouth daily. , Disp: , Rfl:  .  vitamin B-12 (CYANOCOBALAMIN) 1000 MCG tablet, Take 1,000 mcg by mouth daily., Disp: , Rfl:   Past Medical History: Past Medical History:  Diagnosis Date  . Allergy   . Anemia   . Arthritis   . Bell's palsy   . CHF (congestive heart failure) ([place])   . CKD (chronic kidney  disease)   . Depression   . Diabetes ([place])   . Gastric ulcer   . GERD (gastroesophageal reflux disease)   . Gout   . History of chicken pox   . Hyperlipidemia   . Hypertension   . Hypothyroidism   . OSA (obstructive sleep apnea)   . Thyroid disease   . Vitamin D deficiency     Tobacco Use: Social History   Tobacco Use  Smoking Status Former Smoker  . Packs/day: 1.00  . Years: 15.00  . Pack years: 15.00  . Types: Cigarettes  . Quit date: 05/03/1978  . Years since quitting: 40.9  Smokeless Tobacco Former Systems developer  Tobacco Comment   started smoking at age 67    Labs: Recent Review [place] [facility] Cardiac and Pulmonary Rehab Latest Ref Rng & Units 11/18/2017 02/20/2018  06/21/2018 03/02/2019 03/03/2019   Cholestrol 100 - 199 mg/dL 125 101 - - -   LDLCALC 0 - 99 mg/dL Comment 35 - - -   HDL >39 mg/dL 20(L) 24(L) - - -   Trlycerides 0 - 149 mg/dL 449(H) 208(H) - - -   Hemoglobin A1c 4.8 - 5.6 % 5.1 5.6 - 6.1(H) -   PHART 7.350 - 7.450 - - - - 7.38   PCO2ART 32.0 - 48.0 mmHg - - - - 37   HCO3 20.0 - 28.0 mmol/L - - 24.8 - 21.9   ACIDBASEDEF 0.0 - 2.0 mmol/L - - 1.1 - 2.8(H)   O2SAT % - - 70.4 - 92.3       Pulmonary Assessment Scores: Pulmonary Assessment Scores    Row Name 03/20/19 1228         ADL UCSD   ADL Phase  Entry     SOB Score total  16     Rest  0     Walk  1     Stairs  3     Bath  0     Dress  0     Shop  1       CAT Score   CAT Score  12       mMRC Score   mMRC Score  1        UCSD: Self-administered rating of dyspnea associated with activities of daily living (ADLs) 6-point scale (0 = "not at all" to 5 = "maximal or unable to do because of breathlessness")  Scoring Scores range from 0 to 120.  Minimally important difference is 5 units  CAT: CAT can identify the health impairment of COPD patients and is better correlated with disease progression.  CAT has a scoring range of zero to 40. The CAT score is classified into four groups of low (less than 10), medium (10 - 20), high (21-30) and very high (31-40) based on the impact level of disease on health status. A CAT score over 10 suggests significant symptoms.  A worsening CAT score could be explained by an exacerbation, poor medication adherence, poor inhaler technique, or progression of COPD or comorbid conditions.  CAT MCID is 2 points  mMRC: mMRC (Modified Medical Research Council) Dyspnea Scale is used to assess the degree of baseline functional disability in patients of respiratory disease due to dyspnea. No minimal important difference is established. A decrease in score of 1 point or greater is considered a positive change.   Pulmonary Function  Assessment: Pulmonary Function Assessment - 03/19/19 1204      Breath   Shortness  of Breath  Yes;Panic with Shortness of Breath;Limiting activity       Exercise Target Goals: Exercise Program Goal: Individual exercise prescription set using results from initial 6 min walk test and THRR while considering  patient's activity barriers and safety.   Exercise Prescription Goal: Initial exercise prescription builds to 30-45 minutes a day of aerobic activity, 2-3 days per week.  Home exercise guidelines will be given to patient during program as part of exercise prescription that the participant will acknowledge.  Activity Barriers & Risk Stratification: Activity Barriers & Cardiac Risk Stratification - 03/20/19 1219      Activity Barriers & Cardiac Risk Stratification   Activity Barriers  Arthritis;Joint Problems;Deconditioning;Muscular Weakness;Shortness of Breath;Balance Concerns       6 Minute Walk: 6 Minute Walk    Row Name 03/20/19 1215         6 Minute Walk   Phase  Initial     Distance  1060 feet     Walk Time  6 minutes     # of Rest Breaks  0     MPH  2.08     METS  2.72     RPE  8     Perceived Dyspnea   1     VO2 Peak  9.51     Symptoms  Yes (comment)     Comments  knee pain 5/10     Resting HR  67 bpm     Resting BP  152/74     Resting Oxygen Saturation   94 %     Exercise Oxygen Saturation  during 6 min walk  89 %     Max Ex. HR  109 bpm     Max Ex. BP  184/62     2 Minute Post BP  162/84       Interval HR   1 Minute HR  109     2 Minute HR  100     3 Minute HR  89     4 Minute HR  90     5 Minute HR  104     6 Minute HR  104     2 Minute Post HR  84     Interval Heart Rate?  Yes       Interval Oxygen   Interval Oxygen?  Yes     Baseline Oxygen Saturation %  94 %     1 Minute Oxygen Saturation %  91 %     1 Minute Liters of Oxygen  0 L Room Air     2 Minute Oxygen Saturation %  89 %     2 Minute Liters of Oxygen  0 L     3 Minute Oxygen  Saturation %  89 %     3 Minute Liters of Oxygen  0 L     4 Minute Oxygen Saturation %  89 %     4 Minute Liters of Oxygen  0 L     5 Minute Oxygen Saturation %  91 %     5 Minute Liters of Oxygen  0 L     6 Minute Oxygen Saturation %  97 %     6 Minute Liters of Oxygen  0 L     2 Minute Post Oxygen Saturation %  96 %     2 Minute Post Liters of Oxygen  0 L       Oxygen Initial Assessment: Oxygen Initial Assessment -  03/19/19 1202      Home Oxygen   Home Oxygen Device  Home Concentrator    Sleep Oxygen Prescription  BiPAP    Liters per minute  2    Home Exercise Oxygen Prescription  None    Home at Rest Exercise Oxygen Prescription  None    Compliance with Home Oxygen Use  Yes      Initial 6 min Walk   Oxygen Used  None      Program Oxygen Prescription   Program Oxygen Prescription  None      Intervention   Short Term Goals  To learn and exhibit compliance with exercise, home and travel O2 prescription;To learn and understand importance of monitoring SPO2 with pulse oximeter and demonstrate accurate use of the pulse oximeter.;To learn and understand importance of maintaining oxygen saturations>88%;To learn and demonstrate proper pursed lip breathing techniques or other breathing techniques.;To learn and demonstrate proper use of respiratory medications    Long  Term Goals  Exhibits compliance with exercise, home and travel O2 prescription;Verbalizes importance of monitoring SPO2 with pulse oximeter and return demonstration;Exhibits proper breathing techniques, such as pursed lip breathing or other method taught during program session;Maintenance of O2 saturations>88%;Demonstrates proper use of MDI's;Compliance with respiratory medication       Oxygen Re-Evaluation:   Oxygen Discharge (Final Oxygen Re-Evaluation):   Initial Exercise Prescription: Initial Exercise Prescription - 03/20/19 1200      Date of Initial Exercise RX and Referring Provider   Date  03/20/19     Referring Provider  Lujean Amel MD      Treadmill   MPH  2    Grade  0    Minutes  15    METs  2.53      NuStep   Level  2    SPM  80    Minutes  15    METs  2.5      Recumbant Elliptical   Level  1    RPM  50    Minutes  15    METs  2.5      Prescription Details   Frequency (times per week)  3    Duration  Progress to 30 minutes of continuous aerobic without signs/symptoms of physical distress      Intensity   THRR 40-80% of Max Heartrate  104-141    Ratings of Perceived Exertion  11-13    Perceived Dyspnea  0-4      Progression   Progression  Continue to progress workloads to maintain intensity without signs/symptoms of physical distress.      Resistance Training   Training Prescription  Yes    Weight  3 lb    Reps  10-15       Perform Capillary Blood Glucose checks as needed.  Exercise Prescription Changes: Exercise Prescription Changes    Row Name 03/20/19 1200             Response to Exercise   Blood Pressure (Admit)  152/74       Blood Pressure (Exercise)  184/62       Blood Pressure (Exit)  162/84       Heart Rate (Admit)  67 bpm       Heart Rate (Exercise)  109 bpm       Heart Rate (Exit)  66 bpm       Oxygen Saturation (Admit)  94 %       Oxygen Saturation (Exercise)  89 %  Oxygen Saturation (Exit)  94 %       Rating of Perceived Exertion (Exercise)  8       Perceived Dyspnea (Exercise)  1       Symptoms  knee pain 5/10       Comments  walk test results          Exercise Comments:   Exercise Goals and Review: Exercise Goals    Row Name 03/20/19 1222             Exercise Goals   Increase Physical Activity  Yes       Intervention  Provide advice, education, support and counseling about physical activity/exercise needs.;Develop an individualized exercise prescription for aerobic and resistive training based on initial evaluation findings, risk stratification, comorbidities and participant's personal goals.       Expected  Outcomes  Short Term: Attend rehab on a regular basis to increase amount of physical activity.;Long Term: Add in home exercise to make exercise part of routine and to increase amount of physical activity.;Long Term: Exercising regularly at least 3-5 days a week.       Increase Strength and Stamina  Yes       Intervention  Provide advice, education, support and counseling about physical activity/exercise needs.;Develop an individualized exercise prescription for aerobic and resistive training based on initial evaluation findings, risk stratification, comorbidities and participant's personal goals.       Expected Outcomes  Short Term: Increase workloads from initial exercise prescription for resistance, speed, and METs.;Short Term: Perform resistance training exercises routinely during rehab and add in resistance training at home;Long Term: Improve cardiorespiratory fitness, muscular endurance and strength as measured by increased METs and functional capacity (6MWT)       Able to understand and use rate of perceived exertion (RPE) scale  Yes       Intervention  Provide education and explanation on how to use RPE scale       Expected Outcomes  Short Term: Able to use RPE daily in rehab to express subjective intensity level;Long Term:  Able to use RPE to guide intensity level when exercising independently       Knowledge and understanding of Target Heart Rate Range (THRR)  Yes       Intervention  Provide education and explanation of THRR including how the numbers were predicted and where they are located for reference       Expected Outcomes  Short Term: Able to state/look up THRR;Short Term: Able to use daily as guideline for intensity in rehab;Long Term: Able to use THRR to govern intensity when exercising independently       Able to check pulse independently  Yes       Intervention  Provide education and demonstration on how to check pulse in carotid and radial arteries.;Review the importance of being able  to check your own pulse for safety during independent exercise       Expected Outcomes  Short Term: Able to explain why pulse checking is important during independent exercise;Long Term: Able to check pulse independently and accurately       Understanding of Exercise Prescription  Yes       Intervention  Provide education, explanation, and written materials on patient's individual exercise prescription       Expected Outcomes  Short Term: Able to explain program exercise prescription;Long Term: Able to explain home exercise prescription to exercise independently          Exercise Goals Re-Evaluation :  Discharge Exercise Prescription (Final Exercise Prescription Changes): Exercise Prescription Changes - 03/20/19 1200      Response to Exercise   Blood Pressure (Admit)  152/74    Blood Pressure (Exercise)  184/62    Blood Pressure (Exit)  162/84    Heart Rate (Admit)  67 bpm    Heart Rate (Exercise)  109 bpm    Heart Rate (Exit)  66 bpm    Oxygen Saturation (Admit)  94 %    Oxygen Saturation (Exercise)  89 %    Oxygen Saturation (Exit)  94 %    Rating of Perceived Exertion (Exercise)  8    Perceived Dyspnea (Exercise)  1    Symptoms  knee pain 5/10    Comments  walk test results       Nutrition:  Target Goals: Understanding of nutrition guidelines, daily intake of sodium <1591m, cholesterol <2073m calories 30% from fat and 7% or less from saturated fats, daily to have 5 or more servings of fruits and vegetables.  Biometrics: Pre Biometrics - 03/20/19 1225      Pre Biometrics   Height  5' 10.6" (1.793 m)    Weight  278 lb 9.6 oz (126.4 kg)    BMI (Calculated)  39.31    Single Leg Stand  1.31 seconds        Nutrition Therapy Plan and Nutrition Goals:   Nutrition Assessments: Nutrition Assessments - 03/20/19 1226      MEDFICTS Scores   Pre Score  60       Nutrition Goals Re-Evaluation:   Nutrition Goals Discharge (Final Nutrition Goals  Re-Evaluation):   Psychosocial: Target Goals: Acknowledge presence or absence of significant depression and/or stress, maximize coping skills, provide positive support system. Participant is able to verbalize types and ability to use techniques and skills needed for reducing stress and depression.   Initial Review & Psychosocial Screening: Initial Psych Review & Screening - 03/19/19 1204      Initial Review   Current issues with  None Identified      Family Dynamics   Good Support System?  Yes    Comments  He can look to his wife, two sons and grand children for supprort. He states he has a positive outlook on life in genral.      Barriers   Psychosocial barriers to participate in program  The patient should benefit from training in stress management and relaxation.      Screening Interventions   Interventions  To provide support and resources with identified psychosocial needs;Provide feedback about the scores to participant;Encouraged to exercise    Expected Outcomes  Short Term goal: Identification and review with participant of any Quality of Life or Depression concerns found by scoring the questionnaire.;Short Term goal: Utilizing psychosocial counselor, staff and physician to assist with identification of specific Stressors or current issues interfering with healing process. Setting desired goal for each stressor or current issue identified.;Long Term Goal: Stressors or current issues are controlled or eliminated.;Long Term goal: The participant improves quality of Life and PHQ9 Scores as seen by post scores and/or verbalization of changes       Quality of Life Scores:  Scores of 19 and below usually indicate a poorer quality of life in these areas.  A difference of  2-3 points is a clinically meaningful difference.  A difference of 2-3 points in the total score of the Quality of Life Index has been associated with significant improvement in overall quality of life, self-image,  physical symptoms, and general health in studies assessing change in quality of life.  PHQ-9: Recent Review Flowsheet Data    Depression screen [facility] 2/9 03/20/2019 11/18/2017 12/08/2016 02/16/2016 02/12/2015   Decreased Interest 3 1 0 1 2   Down, Depressed, Hopeless 0 1 0 1 2   PHQ - 2 Score 3 2 0 2 4   Altered sleeping 0 0 0 2 2   Tired, decreased energy '3 3 1 3 3   ' Change in appetite 0 0 1 2 0   Feeling bad or failure about yourself  0 0 0 1 0   Trouble concentrating 0 0 0 1 0   Moving slowly or fidgety/restless 0 0 0 1 1   Suicidal thoughts 0 0 0 0 1   PHQ-9 Score '6 5 2 12 11   ' Difficult doing work/chores Somewhat difficult Not difficult at all Somewhat difficult Somewhat difficult Somewhat difficult     Interpretation of Total Score  Total Score Depression Severity:  1-4 = Minimal depression, 5-9 = Mild depression, 10-14 = Moderate depression, 15-19 = Moderately severe depression, 20-27 = Severe depression   Psychosocial Evaluation and Intervention:   Psychosocial Re-Evaluation:   Psychosocial Discharge (Final Psychosocial Re-Evaluation):   Education: Education Goals: Education classes will be provided on a weekly basis, covering required topics. Participant will state understanding/return demonstration of topics presented.  Learning Barriers/Preferences: Learning Barriers/Preferences - 03/19/19 1206      Learning Barriers/Preferences   Learning Barriers  None    Learning Preferences  None       Education Topics:  Initial Evaluation Education: - Verbal, written and demonstration of respiratory meds, oximetry and breathing techniques. Instruction on use of nebulizers and MDIs and importance of monitoring MDI activations.   Pulmonary Rehab from 03/20/2019 in [facility] Cardiac and Pulmonary Rehab  Date  03/19/19  Educator  [facility]  Instruction Review Code  1- Verbalizes Understanding      General Nutrition Guidelines/Fats and Fiber: -Group instruction provided by verbal,  written material, models and posters to present the general guidelines for heart healthy nutrition. Gives an explanation and review of dietary fats and fiber.   Controlling Sodium/Reading Food Labels: -Group verbal and written material supporting the discussion of sodium use in heart healthy nutrition. Review and explanation with models, verbal and written materials for utilization of the food label.   Exercise Physiology & General Exercise Guidelines: - Group verbal and written instruction with models to review the exercise physiology of the cardiovascular system and associated critical values. Provides general exercise guidelines with specific guidelines to those with heart or lung disease.    Aerobic Exercise & Resistance Training: - Gives group verbal and written instruction on the various components of exercise. Focuses on aerobic and resistive training programs and the benefits of this training and how to safely progress through these programs.   Flexibility, Balance, Mind/Body Relaxation: Provides group verbal/written instruction on the benefits of flexibility and balance training, including mind/body exercise modes such as yoga, pilates and tai chi.  Demonstration and skill practice provided.   Stress and Anxiety: - Provides group verbal and written instruction about the health risks of elevated stress and causes of high stress.  Discuss the correlation between heart/lung disease and anxiety and treatment options. Review healthy ways to manage with stress and anxiety.   Depression: - Provides group verbal and written instruction on the correlation between heart/lung disease and depressed mood, treatment options, and the stigmas associated with seeking treatment.  Exercise & Equipment Safety: - Individual verbal instruction and demonstration of equipment use and safety with use of the equipment.   Pulmonary Rehab from 03/20/2019 in [facility] Cardiac and Pulmonary Rehab  Date   03/20/19  Educator  [facility]  Instruction Review Code  1- Verbalizes Understanding      Infection Prevention: - Provides verbal and written material to individual with discussion of infection control including proper hand washing and proper equipment cleaning during exercise session.   Pulmonary Rehab from 03/20/2019 in [facility] Cardiac and Pulmonary Rehab  Date  03/19/19  Educator  [facility]  Instruction Review Code  1- Verbalizes Understanding      Falls Prevention: - Provides verbal and written material to individual with discussion of falls prevention and safety.   Pulmonary Rehab from 03/20/2019 in [facility] Cardiac and Pulmonary Rehab  Date  03/19/19  Educator  [facility]  Instruction Review Code  1- Verbalizes Understanding      Diabetes: - Individual verbal and written instruction to review signs/symptoms of diabetes, desired ranges of glucose level fasting, after meals and with exercise. Advice that pre and post exercise glucose checks will be done for 3 sessions at entry of program.   Pulmonary Rehab from 03/20/2019 in [facility] Cardiac and Pulmonary Rehab  Date  03/19/19  Educator  [facility]  Instruction Review Code  1- Verbalizes Understanding      Chronic Lung Diseases: - Group verbal and written instruction to review updates, respiratory medications, advancements in procedures and treatments. Discuss use of supplemental oxygen including available portable oxygen systems, continuous and intermittent flow rates, concentrators, personal use and safety guidelines. Review proper use of inhaler and spacers. Provide informative websites for self-education.    Energy Conservation: - Provide group verbal and written instruction for methods to conserve energy, plan and organize activities. Instruct on pacing techniques, use of adaptive equipment and posture/positioning to relieve shortness of breath.   Triggers and Exacerbations: - Group verbal and written instruction to review types of environmental triggers  and ways to prevent exacerbations. Discuss weather changes, air quality and the benefits of nasal washing. Review warning signs and symptoms to help prevent infections. Discuss techniques for effective airway clearance, coughing, and vibrations.   AED/CPR: - Group verbal and written instruction with the use of models to demonstrate the basic use of the AED with the basic ABC's of resuscitation.   Anatomy and Physiology of the Lungs: - Group verbal and written instruction with the use of models to provide basic lung anatomy and physiology related to function, structure and complications of lung disease.   Anatomy & Physiology of the Heart: - Group verbal and written instruction and models provide basic cardiac anatomy and physiology, with the coronary electrical and arterial systems. Review of Valvular disease and Heart Failure   Cardiac Medications: - Group verbal and written instruction to review commonly prescribed medications for heart disease. Reviews the medication, class of the drug, and side effects.   Know Your Numbers and Risk Factors: -Group verbal and written instruction about important numbers in your health.  Discussion of what are risk factors and how they play a role in the disease process.  Review of Cholesterol, Blood Pressure, Diabetes, and BMI and the role they play in your overall health.   Sleep Hygiene: -Provides group verbal and written instruction about how sleep can affect your health.  Define sleep hygiene, discuss sleep cycles and impact of sleep habits. Review good sleep hygiene tips.    Other: -Provides group and verbal  instruction on various topics (see comments)    Knowledge Questionnaire Score: Knowledge Questionnaire Score - 03/20/19 1226      Knowledge Questionnaire Score   Pre Score  15/16 Education Focus: Oxygen safety        Core Components/Risk Factors/Patient Goals at Admission: Personal Goals and Risk Factors at Admission - 03/20/19  1227      Core Components/Risk Factors/Patient Goals on Admission    Weight Management  Yes;Weight Loss;Obesity    Intervention  Weight Management: Develop a combined nutrition and exercise program designed to reach desired caloric intake, while maintaining appropriate intake of nutrient and fiber, sodium and fats, and appropriate energy expenditure required for the weight goal.;Weight Management: Provide education and appropriate resources to help participant work on and attain dietary goals.;Weight Management/Obesity: Establish reasonable short term and long term weight goals.;Obesity: Provide education and appropriate resources to help participant work on and attain dietary goals.    Admit Weight  278 lb 9.6 oz (126.4 kg)    Goal Weight: Short Term  273 lb (123.8 kg)    Goal Weight: Long Term  268 lb (121.6 kg)    Expected Outcomes  Short Term: Continue to assess and modify interventions until short term weight is achieved;Long Term: Adherence to nutrition and physical activity/exercise program aimed toward attainment of established weight goal;Weight Loss: Understanding of general recommendations for a balanced deficit meal plan, which promotes 1-2 lb weight loss per week and includes a negative energy balance of [phone] kcal/d;Understanding recommendations for meals to include 15-35% energy as protein, 25-35% energy from fat, 35-60% energy from carbohydrates, less than 227m of dietary cholesterol, 20-35 gm of total fiber daily;Understanding of distribution of calorie intake throughout the day with the consumption of 4-5 meals/snacks    Improve shortness of breath with ADL's  Yes    Intervention  Provide education, individualized exercise plan and daily activity instruction to help decrease symptoms of SOB with activities of daily living.    Expected Outcomes  Short Term: Improve cardiorespiratory fitness to achieve a reduction of symptoms when performing ADLs;Long Term: Be able to perform more ADLs  without symptoms or delay the onset of symptoms    Diabetes  Yes    Intervention  Provide education about signs/symptoms and action to take for hypo/hyperglycemia.;Provide education about proper nutrition, including hydration, and aerobic/resistive exercise prescription along with prescribed medications to achieve blood glucose in normal ranges: Fasting glucose 65-99 mg/dL    Expected Outcomes  Short Term: Participant verbalizes understanding of the signs/symptoms and immediate care of hyper/hypoglycemia, proper foot care and importance of medication, aerobic/resistive exercise and nutrition plan for blood glucose control.;Long Term: Attainment of HbA1C < 7%.    Heart Failure  Yes    Intervention  Provide a combined exercise and nutrition program that is supplemented with education, support and counseling about heart failure. Directed toward relieving symptoms such as shortness of breath, decreased exercise tolerance, and extremity edema.    Expected Outcomes  Improve functional capacity of life;Short term: Attendance in program 2-3 days a week with increased exercise capacity. Reported lower sodium intake. Reported increased fruit and vegetable intake. Reports medication compliance.;Short term: Daily weights obtained and reported for increase. Utilizing diuretic protocols set by physician.;Long term: Adoption of self-care skills and reduction of barriers for early signs and symptoms recognition and intervention leading to self-care maintenance.    Hypertension  Yes    Intervention  Provide education on lifestyle modifcations including regular physical activity/exercise, weight management, moderate sodium restriction  and increased consumption of fresh fruit, vegetables, and low fat dairy, alcohol moderation, and smoking cessation.;Monitor prescription use compliance.    Expected Outcomes  Short Term: Continued assessment and intervention until BP is < 140/69m HG in hypertensive participants. < 130/839mHG  in hypertensive participants with diabetes, heart failure or chronic kidney disease.;Long Term: Maintenance of blood pressure at goal levels.    Lipids  Yes    Intervention  Provide education and support for participant on nutrition & aerobic/resistive exercise along with prescribed medications to achieve LDL <7010mHDL >73m23m  Expected Outcomes  Short Term: Participant states understanding of desired cholesterol values and is compliant with medications prescribed. Participant is following exercise prescription and nutrition guidelines.;Long Term: Cholesterol controlled with medications as prescribed, with individualized exercise RX and with personalized nutrition plan. Value goals: LDL < 70mg69mL > 40 mg.       Core Components/Risk Factors/Patient Goals Review:    Core Components/Risk Factors/Patient Goals at Discharge (Final Review):    ITP Comments: ITP Comments    Row Name 03/19/19 1213 03/20/19 1158         ITP Comments  Virtual Orientation performed. Patient informed when to come in for RD and EP orientation. Diagnosis can be found in [facility]/2020.  Completed 6MWT and gym orientation.  Initial ITP created and sent for review to Dr. Mark Emily Filbertical Director.         Comments: Initial ITP

## 2019-03-20 NOTE — Telephone Encounter (Signed)
Patient states he went out of work on 03/01/2019 and was admitted to [facility]. On 11/6 patient was discharged from hospital. He is currently still out of work.  On 11/16 patient seen Cardiologist and was cleared. Patient sees Dr. Caryn Section on 11/20, Mason on 11/30 and Nephrologist in December.

## 2019-03-20 NOTE — Telephone Encounter (Signed)
We need to know dates he was out of work.

## 2019-03-20 NOTE — Telephone Encounter (Signed)
Patrick Hurst (Patient) Patrick Hurst (Patient) General - Other  Reason for CRM: pt called and stated that short term disability forms have been sent the office. Pt states that he is starting to run out of money and would need forms filled out as soon as possible. Pt states that forms should coming from [employer]. Please advise

## 2019-03-20 NOTE — Progress Notes (Signed)
Cardiac Individual Treatment Plan  Patient Details  Name: Patrick Hurst MRN: [id number] Date of Birth: Apr 08, 1956 Referring Provider:     Pulmonary Rehab from 03/20/2019 in [facility] Cardiac and Pulmonary Rehab  Referring Provider  Lujean Amel MD      Initial Encounter Date:    Pulmonary Rehab from 03/20/2019 in [facility] Cardiac and Pulmonary Rehab  Date  03/20/19      Visit Diagnosis: OSA (obstructive sleep apnea)  Patient's Home Medications on Admission:  Current Outpatient Medications:  .  albuterol (VENTOLIN HFA) 108 (90 Base) MCG/ACT inhaler, USE 2 INHALATIONS EVERY 4 HOURS AS NEEDED FOR WHEEZING OR SHORTNESS OF BREATH, Disp: 8.5 g, Rfl: 10 .  amiodarone (PACERONE) 200 MG tablet, Take 400 mg (two tabs) twice daily for 5 more days, then take 200 mg (1 tab) daily, Disp: 45 tablet, Rfl: 0 .  amitriptyline (ELAVIL) 25 MG tablet, Take 1 tablet (25 mg total) by mouth at bedtime., Disp: 90 tablet, Rfl: 3 .  amLODipine (NORVASC) 5 MG tablet, Take 1 tablet (5 mg total) by mouth daily., Disp: 30 tablet, Rfl: 3 .  apixaban (ELIQUIS) 5 MG TABS tablet, Take 1 tablet (5 mg total) by mouth 2 (two) times daily., Disp: 60 tablet, Rfl: 3 .  Ascorbic Acid (VITAMIN C) 1000 MG tablet, Take 1,000 mg by mouth 2 (two) times daily. , Disp: , Rfl:  .  atorvastatin (LIPITOR) 40 MG tablet, Take 1 tablet (40 mg total) by mouth daily., Disp: 90 tablet, Rfl: 4 .  calcitRIOL (ROCALTROL) 0.5 MCG capsule, Take 0.5 mcg by mouth daily., Disp: , Rfl:  .  cetirizine (ZYRTEC) 10 MG tablet, Take 10 mg by mouth daily. , Disp: , Rfl:  .  fluticasone furoate-vilanterol (BREO ELLIPTA) 100-25 MCG/INH AEPB, Inhale 1 puff into the lungs daily., Disp: 180 each, Rfl: 3 .  hydrALAZINE (APRESOLINE) 25 MG tablet, TAKE 1 TABLET THREE TIMES A DAY, Disp: 270 tablet, Rfl: 4 .  insulin regular human CONCENTRATED (HUMULIN R) 500 UNIT/ML injection, Inject up to 25 units twice a day, or as directed by physician, Disp: 60 mL, Rfl: 3 .   ipratropium-albuterol (DUONEB) 0.5-2.5 (3) MG/3ML SOLN, INHALE 3 ML BY NEBULIZATION EVERY 4 HOURS AS NEEDED, Disp: 360 mL, Rfl: 8 .  levothyroxine (SYNTHROID) 100 MCG tablet, Take 1 tablet (100 mcg total) by mouth daily., Disp: 90 tablet, Rfl: 3 .  metoprolol tartrate (LOPRESSOR) 50 MG tablet, Take 1 tablet (50 mg total) by mouth 2 (two) times daily., Disp: 60 tablet, Rfl: 3 .  Misc. Devices (PULSE OXIMETER FOR FINGER) MISC, 1 Device by Does not apply route daily as needed., Disp: 1 each, Rfl: 0 .  pantoprazole (PROTONIX) 40 MG tablet, TAKE 1 TABLET TWICE A DAY, Disp: 180 tablet, Rfl: 3 .  testosterone cypionate (DEPO-TESTOSTERONE) 200 MG/ML injection, Inject 1 mL (200 mg total) into the muscle every 14 (fourteen) days., Disp: 1 mL, Rfl: 5 .  torsemide (DEMADEX) 20 MG tablet, Take 2 tablets (40 mg total) by mouth 2 (two) times daily., Disp: 60 tablet, Rfl: 3 .  ULORIC 80 MG TABS, Take 80 mg by mouth daily. , Disp: , Rfl:  .  vitamin B-12 (CYANOCOBALAMIN) 1000 MCG tablet, Take 1,000 mcg by mouth daily., Disp: , Rfl:   Past Medical History: Past Medical History:  Diagnosis Date  . Allergy   . Anemia   . Arthritis   . Bell's palsy   . CHF (congestive heart failure) ([place])   . CKD (chronic kidney  disease)   . Depression   . Diabetes ([place])   . Gastric ulcer   . GERD (gastroesophageal reflux disease)   . Gout   . History of chicken pox   . Hyperlipidemia   . Hypertension   . Hypothyroidism   . OSA (obstructive sleep apnea)   . Thyroid disease   . Vitamin D deficiency     Tobacco Use: Social History   Tobacco Use  Smoking Status Former Smoker  . Packs/day: 1.00  . Years: 15.00  . Pack years: 15.00  . Types: Cigarettes  . Quit date: 05/03/1978  . Years since quitting: 40.9  Smokeless Tobacco Former Systems developer  Tobacco Comment   started smoking at age 45    Labs: Recent Review [facility] Cardiac and Pulmonary Rehab Latest Ref Rng & Units 11/18/2017 02/20/2018  06/21/2018 03/02/2019 03/03/2019   Cholestrol 100 - 199 mg/dL 125 101 - - -   LDLCALC 0 - 99 mg/dL Comment 35 - - -   HDL >39 mg/dL 20(L) 24(L) - - -   Trlycerides 0 - 149 mg/dL 449(H) 208(H) - - -   Hemoglobin A1c 4.8 - 5.6 % 5.1 5.6 - 6.1(H) -   PHART 7.350 - 7.450 - - - - 7.38   PCO2ART 32.0 - 48.0 mmHg - - - - 37   HCO3 20.0 - 28.0 mmol/L - - 24.8 - 21.9   ACIDBASEDEF 0.0 - 2.0 mmol/L - - 1.1 - 2.8(H)   O2SAT % - - 70.4 - 92.3       Exercise Target Goals: Exercise Program Goal: Individual exercise prescription set using results from initial 6 min walk test and THRR while considering  patient's activity barriers and safety.   Exercise Prescription Goal: Initial exercise prescription builds to 30-45 minutes a day of aerobic activity, 2-3 days per week.  Home exercise guidelines will be given to patient during program as part of exercise prescription that the participant will acknowledge.  Activity Barriers & Risk Stratification: Activity Barriers & Cardiac Risk Stratification - 03/20/19 1219      Activity Barriers & Cardiac Risk Stratification   Activity Barriers  Arthritis;Joint Problems;Deconditioning;Muscular Weakness;Shortness of Breath;Balance Concerns       6 Minute Walk: 6 Minute Walk    Row Name 03/20/19 1215         6 Minute Walk   Phase  Initial     Distance  1060 feet     Walk Time  6 minutes     # of Rest Breaks  0     MPH  2.08     METS  2.72     RPE  8     Perceived Dyspnea   1     VO2 Peak  9.51     Symptoms  Yes (comment)     Comments  knee pain 5/10     Resting HR  67 bpm     Resting BP  152/74     Resting Oxygen Saturation   94 %     Exercise Oxygen Saturation  during 6 min walk  89 %     Max Ex. HR  109 bpm     Max Ex. BP  184/62     2 Minute Post BP  162/84       Interval HR   1 Minute HR  109     2 Minute HR  100     3 Minute HR  89     4 Minute HR  90     5 Minute HR  104     6 Minute HR  104     2 Minute Post HR  84     Interval  Heart Rate?  Yes       Interval Oxygen   Interval Oxygen?  Yes     Baseline Oxygen Saturation %  94 %     1 Minute Oxygen Saturation %  91 %     1 Minute Liters of Oxygen  0 L Room Air     2 Minute Oxygen Saturation %  89 %     2 Minute Liters of Oxygen  0 L     3 Minute Oxygen Saturation %  89 %     3 Minute Liters of Oxygen  0 L     4 Minute Oxygen Saturation %  89 %     4 Minute Liters of Oxygen  0 L     5 Minute Oxygen Saturation %  91 %     5 Minute Liters of Oxygen  0 L     6 Minute Oxygen Saturation %  97 %     6 Minute Liters of Oxygen  0 L     2 Minute Post Oxygen Saturation %  96 %     2 Minute Post Liters of Oxygen  0 L        Oxygen Initial Assessment: Oxygen Initial Assessment - 03/19/19 1202      Home Oxygen   Home Oxygen Device  Home Concentrator    Sleep Oxygen Prescription  BiPAP    Liters per minute  2    Home Exercise Oxygen Prescription  None    Home at Rest Exercise Oxygen Prescription  None    Compliance with Home Oxygen Use  Yes      Initial 6 min Walk   Oxygen Used  None      Program Oxygen Prescription   Program Oxygen Prescription  None      Intervention   Short Term Goals  To learn and exhibit compliance with exercise, home and travel O2 prescription;To learn and understand importance of monitoring SPO2 with pulse oximeter and demonstrate accurate use of the pulse oximeter.;To learn and understand importance of maintaining oxygen saturations>88%;To learn and demonstrate proper pursed lip breathing techniques or other breathing techniques.;To learn and demonstrate proper use of respiratory medications    Long  Term Goals  Exhibits compliance with exercise, home and travel O2 prescription;Verbalizes importance of monitoring SPO2 with pulse oximeter and return demonstration;Exhibits proper breathing techniques, such as pursed lip breathing or other method taught during program session;Maintenance of O2 saturations>88%;Demonstrates proper use of  MDI's;Compliance with respiratory medication       Oxygen Re-Evaluation:   Oxygen Discharge (Final Oxygen Re-Evaluation):   Initial Exercise Prescription: Initial Exercise Prescription - 03/20/19 1200      Date of Initial Exercise RX and Referring Provider   Date  03/20/19    Referring Provider  Lujean Amel MD      Treadmill   MPH  2    Grade  0    Minutes  15    METs  2.53      NuStep   Level  2    SPM  80    Minutes  15    METs  2.5      Recumbant Elliptical   Level  1  RPM  50    Minutes  15    METs  2.5      Prescription Details   Frequency (times per week)  3    Duration  Progress to 30 minutes of continuous aerobic without signs/symptoms of physical distress      Intensity   THRR 40-80% of Max Heartrate  104-141    Ratings of Perceived Exertion  11-13    Perceived Dyspnea  0-4      Progression   Progression  Continue to progress workloads to maintain intensity without signs/symptoms of physical distress.      Resistance Training   Training Prescription  Yes    Weight  3 lb    Reps  10-15       Perform Capillary Blood Glucose checks as needed.  Exercise Prescription Changes: Exercise Prescription Changes    Row Name 03/20/19 1200             Response to Exercise   Blood Pressure (Admit)  152/74       Blood Pressure (Exercise)  184/62       Blood Pressure (Exit)  162/84       Heart Rate (Admit)  67 bpm       Heart Rate (Exercise)  109 bpm       Heart Rate (Exit)  66 bpm       Oxygen Saturation (Admit)  94 %       Oxygen Saturation (Exercise)  89 %       Oxygen Saturation (Exit)  94 %       Rating of Perceived Exertion (Exercise)  8       Perceived Dyspnea (Exercise)  1       Symptoms  knee pain 5/10       Comments  walk test results          Exercise Comments:   Exercise Goals and Review: Exercise Goals    Row Name 03/20/19 1222             Exercise Goals   Increase Physical Activity  Yes       Intervention   Provide advice, education, support and counseling about physical activity/exercise needs.;Develop an individualized exercise prescription for aerobic and resistive training based on initial evaluation findings, risk stratification, comorbidities and participant's personal goals.       Expected Outcomes  Short Term: Attend rehab on a regular basis to increase amount of physical activity.;Long Term: Add in home exercise to make exercise part of routine and to increase amount of physical activity.;Long Term: Exercising regularly at least 3-5 days a week.       Increase Strength and Stamina  Yes       Intervention  Provide advice, education, support and counseling about physical activity/exercise needs.;Develop an individualized exercise prescription for aerobic and resistive training based on initial evaluation findings, risk stratification, comorbidities and participant's personal goals.       Expected Outcomes  Short Term: Increase workloads from initial exercise prescription for resistance, speed, and METs.;Short Term: Perform resistance training exercises routinely during rehab and add in resistance training at home;Long Term: Improve cardiorespiratory fitness, muscular endurance and strength as measured by increased METs and functional capacity (6MWT)       Able to understand and use rate of perceived exertion (RPE) scale  Yes       Intervention  Provide education and explanation on how to use RPE scale  Expected Outcomes  Short Term: Able to use RPE daily in rehab to express subjective intensity level;Long Term:  Able to use RPE to guide intensity level when exercising independently       Knowledge and understanding of Target Heart Rate Range (THRR)  Yes       Intervention  Provide education and explanation of THRR including how the numbers were predicted and where they are located for reference       Expected Outcomes  Short Term: Able to state/look up THRR;Short Term: Able to use daily as  guideline for intensity in rehab;Long Term: Able to use THRR to govern intensity when exercising independently       Able to check pulse independently  Yes       Intervention  Provide education and demonstration on how to check pulse in carotid and radial arteries.;Review the importance of being able to check your own pulse for safety during independent exercise       Expected Outcomes  Short Term: Able to explain why pulse checking is important during independent exercise;Long Term: Able to check pulse independently and accurately       Understanding of Exercise Prescription  Yes       Intervention  Provide education, explanation, and written materials on patient's individual exercise prescription       Expected Outcomes  Short Term: Able to explain program exercise prescription;Long Term: Able to explain home exercise prescription to exercise independently          Exercise Goals Re-Evaluation :   Discharge Exercise Prescription (Final Exercise Prescription Changes): Exercise Prescription Changes - 03/20/19 1200      Response to Exercise   Blood Pressure (Admit)  152/74    Blood Pressure (Exercise)  184/62    Blood Pressure (Exit)  162/84    Heart Rate (Admit)  67 bpm    Heart Rate (Exercise)  109 bpm    Heart Rate (Exit)  66 bpm    Oxygen Saturation (Admit)  94 %    Oxygen Saturation (Exercise)  89 %    Oxygen Saturation (Exit)  94 %    Rating of Perceived Exertion (Exercise)  8    Perceived Dyspnea (Exercise)  1    Symptoms  knee pain 5/10    Comments  walk test results       Nutrition:  Target Goals: Understanding of nutrition guidelines, daily intake of sodium <1554m, cholesterol <2076m calories 30% from fat and 7% or less from saturated fats, daily to have 5 or more servings of fruits and vegetables.  Biometrics: Pre Biometrics - 03/20/19 1225      Pre Biometrics   Height  5' 10.6" (1.793 m)    Weight  278 lb 9.6 oz (126.4 kg)    BMI (Calculated)  39.31    Single  Leg Stand  1.31 seconds        Nutrition Therapy Plan and Nutrition Goals:   Nutrition Assessments: Nutrition Assessments - 03/20/19 1226      MEDFICTS Scores   Pre Score  60       Nutrition Goals Re-Evaluation:   Nutrition Goals Discharge (Final Nutrition Goals Re-Evaluation):   Psychosocial: Target Goals: Acknowledge presence or absence of significant depression and/or stress, maximize coping skills, provide positive support system. Participant is able to verbalize types and ability to use techniques and skills needed for reducing stress and depression.   Initial Review & Psychosocial Screening: Initial Psych Review & Screening - 03/19/19 1204  Initial Review   Current issues with  None Identified      Family Dynamics   Good Support System?  Yes    Comments  He can look to his wife, two sons and grand children for supprort. He states he has a positive outlook on life in genral.      Barriers   Psychosocial barriers to participate in program  The patient should benefit from training in stress management and relaxation.      Screening Interventions   Interventions  To provide support and resources with identified psychosocial needs;Provide feedback about the scores to participant;Encouraged to exercise    Expected Outcomes  Short Term goal: Identification and review with participant of any Quality of Life or Depression concerns found by scoring the questionnaire.;Short Term goal: Utilizing psychosocial counselor, staff and physician to assist with identification of specific Stressors or current issues interfering with healing process. Setting desired goal for each stressor or current issue identified.;Long Term Goal: Stressors or current issues are controlled or eliminated.;Long Term goal: The participant improves quality of Life and PHQ9 Scores as seen by post scores and/or verbalization of changes       Quality of Life Scores:   Scores of 19 and below usually  indicate a poorer quality of life in these areas.  A difference of  2-3 points is a clinically meaningful difference.  A difference of 2-3 points in the total score of the Quality of Life Index has been associated with significant improvement in overall quality of life, self-image, physical symptoms, and general health in studies assessing change in quality of life.  PHQ-9: Recent Review Flowsheet Data    Depression screen [facility] 2/9 03/20/2019 11/18/2017 12/08/2016 02/16/2016 02/12/2015   Decreased Interest 3 1 0 1 2   Down, Depressed, Hopeless 0 1 0 1 2   PHQ - 2 Score 3 2 0 2 4   Altered sleeping 0 0 0 2 2   Tired, decreased energy '3 3 1 3 3   ' Change in appetite 0 0 1 2 0   Feeling bad or failure about yourself  0 0 0 1 0   Trouble concentrating 0 0 0 1 0   Moving slowly or fidgety/restless 0 0 0 1 1   Suicidal thoughts 0 0 0 0 1   PHQ-9 Score '6 5 2 12 11   ' Difficult doing work/chores Somewhat difficult Not difficult at all Somewhat difficult Somewhat difficult Somewhat difficult     Interpretation of Total Score  Total Score Depression Severity:  1-4 = Minimal depression, 5-9 = Mild depression, 10-14 = Moderate depression, 15-19 = Moderately severe depression, 20-27 = Severe depression   Psychosocial Evaluation and Intervention:   Psychosocial Re-Evaluation:   Psychosocial Discharge (Final Psychosocial Re-Evaluation):   Vocational Rehabilitation: Provide vocational rehab assistance to qualifying candidates.   Vocational Rehab Evaluation & Intervention:   Education: Education Goals: Education classes will be provided on a variety of topics geared toward better understanding of heart health and risk factor modification. Participant will state understanding/return demonstration of topics presented as noted by education test scores.  Learning Barriers/Preferences: Learning Barriers/Preferences - 03/19/19 1206      Learning Barriers/Preferences   Learning Barriers  None     Learning Preferences  None       Education Topics:  AED/CPR: - Group verbal and written instruction with the use of models to demonstrate the basic use of the AED with the basic ABC's of resuscitation.   General  Nutrition Guidelines/Fats and Fiber: -Group instruction provided by verbal, written material, models and posters to present the general guidelines for heart healthy nutrition. Gives an explanation and review of dietary fats and fiber.   Controlling Sodium/Reading Food Labels: -Group verbal and written material supporting the discussion of sodium use in heart healthy nutrition. Review and explanation with models, verbal and written materials for utilization of the food label.   Exercise Physiology & General Exercise Guidelines: - Group verbal and written instruction with models to review the exercise physiology of the cardiovascular system and associated critical values. Provides general exercise guidelines with specific guidelines to those with heart or lung disease.    Aerobic Exercise & Resistance Training: - Gives group verbal and written instruction on the various components of exercise. Focuses on aerobic and resistive training programs and the benefits of this training and how to safely progress through these programs..   Flexibility, Balance, Mind/Body Relaxation: Provides group verbal/written instruction on the benefits of flexibility and balance training, including mind/body exercise modes such as yoga, pilates and tai chi.  Demonstration and skill practice provided.   Stress and Anxiety: - Provides group verbal and written instruction about the health risks of elevated stress and causes of high stress.  Discuss the correlation between heart/lung disease and anxiety and treatment options. Review healthy ways to manage with stress and anxiety.   Depression: - Provides group verbal and written instruction on the correlation between heart/lung disease and depressed  mood, treatment options, and the stigmas associated with seeking treatment.   Anatomy & Physiology of the Heart: - Group verbal and written instruction and models provide basic cardiac anatomy and physiology, with the coronary electrical and arterial systems. Review of Valvular disease and Heart Failure   Cardiac Procedures: - Group verbal and written instruction to review commonly prescribed medications for heart disease. Reviews the medication, class of the drug, and side effects. Includes the steps to properly store meds and maintain the prescription regimen. (beta blockers and nitrates)   Cardiac Medications I: - Group verbal and written instruction to review commonly prescribed medications for heart disease. Reviews the medication, class of the drug, and side effects. Includes the steps to properly store meds and maintain the prescription regimen.   Cardiac Medications II: -Group verbal and written instruction to review commonly prescribed medications for heart disease. Reviews the medication, class of the drug, and side effects. (all other drug classes)    Go Sex-Intimacy & Heart Disease, Get SMART - Goal Setting: - Group verbal and written instruction through game format to discuss heart disease and the return to sexual intimacy. Provides group verbal and written material to discuss and apply goal setting through the application of the S.M.A.R.T. Method.   Other Matters of the Heart: - Provides group verbal, written materials and models to describe Stable Angina and Peripheral Artery. Includes description of the disease process and treatment options available to the cardiac patient.   Exercise & Equipment Safety: - Individual verbal instruction and demonstration of equipment use and safety with use of the equipment.   Pulmonary Rehab from 03/20/2019 in [facility] Cardiac and Pulmonary Rehab  Date  03/20/19  Educator  [facility]  Instruction Review Code  1- Verbalizes Understanding       Infection Prevention: - Provides verbal and written material to individual with discussion of infection control including proper hand washing and proper equipment cleaning during exercise session.   Pulmonary Rehab from 03/20/2019 in [facility] Cardiac and Pulmonary Rehab  Date  03/19/19  Educator  [facility]  Instruction Review Code  1- Verbalizes Understanding      Falls Prevention: - Provides verbal and written material to individual with discussion of falls prevention and safety.   Pulmonary Rehab from 03/20/2019 in [facility] Cardiac and Pulmonary Rehab  Date  03/19/19  Educator  [facility]  Instruction Review Code  1- Verbalizes Understanding      Diabetes: - Individual verbal and written instruction to review signs/symptoms of diabetes, desired ranges of glucose level fasting, after meals and with exercise. Acknowledge that pre and post exercise glucose checks will be done for 3 sessions at entry of program.   Pulmonary Rehab from 03/20/2019 in [facility] Cardiac and Pulmonary Rehab  Date  03/19/19  Educator  [facility]  Instruction Review Code  1- Verbalizes Understanding      Know Your Numbers and Risk Factors: -Group verbal and written instruction about important numbers in your health.  Discussion of what are risk factors and how they play a role in the disease process.  Review of Cholesterol, Blood Pressure, Diabetes, and BMI and the role they play in your overall health.   Sleep Hygiene: -Provides group verbal and written instruction about how sleep can affect your health.  Define sleep hygiene, discuss sleep cycles and impact of sleep habits. Review good sleep hygiene tips.    Other: -Provides group and verbal instruction on various topics (see comments)   Knowledge Questionnaire Score: Knowledge Questionnaire Score - 03/20/19 1226      Knowledge Questionnaire Score   Pre Score  15/16 Education Focus: Oxygen safety       Core Components/Risk Factors/Patient Goals at Admission: Personal  Goals and Risk Factors at Admission - 03/20/19 1227      Core Components/Risk Factors/Patient Goals on Admission    Weight Management  Yes;Weight Loss;Obesity    Intervention  Weight Management: Develop a combined nutrition and exercise program designed to reach desired caloric intake, while maintaining appropriate intake of nutrient and fiber, sodium and fats, and appropriate energy expenditure required for the weight goal.;Weight Management: Provide education and appropriate resources to help participant work on and attain dietary goals.;Weight Management/Obesity: Establish reasonable short term and long term weight goals.;Obesity: Provide education and appropriate resources to help participant work on and attain dietary goals.    Admit Weight  278 lb 9.6 oz (126.4 kg)    Goal Weight: Short Term  273 lb (123.8 kg)    Goal Weight: Long Term  268 lb (121.6 kg)    Expected Outcomes  Short Term: Continue to assess and modify interventions until short term weight is achieved;Long Term: Adherence to nutrition and physical activity/exercise program aimed toward attainment of established weight goal;Weight Loss: Understanding of general recommendations for a balanced deficit meal plan, which promotes 1-2 lb weight loss per week and includes a negative energy balance of [phone] kcal/d;Understanding recommendations for meals to include 15-35% energy as protein, 25-35% energy from fat, 35-60% energy from carbohydrates, less than 232m of dietary cholesterol, 20-35 gm of total fiber daily;Understanding of distribution of calorie intake throughout the day with the consumption of 4-5 meals/snacks    Improve shortness of breath with ADL's  Yes    Intervention  Provide education, individualized exercise plan and daily activity instruction to help decrease symptoms of SOB with activities of daily living.    Expected Outcomes  Short Term: Improve cardiorespiratory fitness to achieve a reduction of symptoms when  performing ADLs;Long Term: Be able to perform more ADLs without symptoms  or delay the onset of symptoms    Diabetes  Yes    Intervention  Provide education about signs/symptoms and action to take for hypo/hyperglycemia.;Provide education about proper nutrition, including hydration, and aerobic/resistive exercise prescription along with prescribed medications to achieve blood glucose in normal ranges: Fasting glucose 65-99 mg/dL    Expected Outcomes  Short Term: Participant verbalizes understanding of the signs/symptoms and immediate care of hyper/hypoglycemia, proper foot care and importance of medication, aerobic/resistive exercise and nutrition plan for blood glucose control.;Long Term: Attainment of HbA1C < 7%.    Heart Failure  Yes    Intervention  Provide a combined exercise and nutrition program that is supplemented with education, support and counseling about heart failure. Directed toward relieving symptoms such as shortness of breath, decreased exercise tolerance, and extremity edema.    Expected Outcomes  Improve functional capacity of life;Short term: Attendance in program 2-3 days a week with increased exercise capacity. Reported lower sodium intake. Reported increased fruit and vegetable intake. Reports medication compliance.;Short term: Daily weights obtained and reported for increase. Utilizing diuretic protocols set by physician.;Long term: Adoption of self-care skills and reduction of barriers for early signs and symptoms recognition and intervention leading to self-care maintenance.    Hypertension  Yes    Intervention  Provide education on lifestyle modifcations including regular physical activity/exercise, weight management, moderate sodium restriction and increased consumption of fresh fruit, vegetables, and low fat dairy, alcohol moderation, and smoking cessation.;Monitor prescription use compliance.    Expected Outcomes  Short Term: Continued assessment and intervention until BP is <  140/27m HG in hypertensive participants. < 130/822mHG in hypertensive participants with diabetes, heart failure or chronic kidney disease.;Long Term: Maintenance of blood pressure at goal levels.    Lipids  Yes    Intervention  Provide education and support for participant on nutrition & aerobic/resistive exercise along with prescribed medications to achieve LDL <7036mHDL >53m55m  Expected Outcomes  Short Term: Participant states understanding of desired cholesterol values and is compliant with medications prescribed. Participant is following exercise prescription and nutrition guidelines.;Long Term: Cholesterol controlled with medications as prescribed, with individualized exercise RX and with personalized nutrition plan. Value goals: LDL < 70mg38mL > 40 mg.       Core Components/Risk Factors/Patient Goals Review:    Core Components/Risk Factors/Patient Goals at Discharge (Final Review):    ITP Comments: ITP Comments    Row Name 03/19/19 1213 03/20/19 1158         ITP Comments  Virtual Orientation performed. Patient informed when to come in for RD and EP orientation. Diagnosis can be found in [facility]/2020.  Completed 6MWT and gym orientation.  Initial ITP created and sent for review to Dr. Mark Emily Filbertical Director.         Comments: Initial ITP

## 2019-03-21 ENCOUNTER — Encounter: Payer: Commercial Managed Care - PPO | Admitting: *Deleted

## 2019-03-21 ENCOUNTER — Other Ambulatory Visit: Payer: Self-pay

## 2019-03-21 DIAGNOSIS — G4733 Obstructive sleep apnea (adult) (pediatric): Secondary | ICD-10-CM

## 2019-03-21 LAB — GLUCOSE, CAPILLARY
Glucose-Capillary: 305 mg/dL — ABNORMAL HIGH (ref 70–99)
Glucose-Capillary: 265 mg/dL — ABNORMAL HIGH (ref 70–99)

## 2019-03-21 NOTE — Progress Notes (Signed)
Daily Session Note  Patient Details  Name: Patrick Hurst MRN: [id number] Date of Birth: 07/14/1955 Referring Provider:     Pulmonary Rehab from 03/20/2019 in [facility] Cardiac and Pulmonary Rehab  Referring Provider  Callwood, Dwayne MD      Encounter Date: 03/21/2019  Check In: Session Check In - 03/21/19 0743      Check-In   Supervising physician immediately available to respond to emergencies  See telemetry face sheet for immediately available ER MD    Location  [facility]-Cardiac & Pulmonary Rehab    Staff Present  Jeanna Durrell BS, Exercise Physiologist;Krista Spencer, RN BSN;Susanne Bice, RN, BSN, CCRP    Virtual Visit  No    Medication changes reported      No    Fall or balance concerns reported     No    Warm-up and Cool-down  Performed on first and last piece of equipment    Resistance Training Performed  Yes    VAD Patient?  No    PAD/SET Patient?  No      Pain Assessment   Currently in Pain?  No/denies          Social History   Tobacco Use  Smoking Status Former Smoker  . Packs/day: 1.00  . Years: 15.00  . Pack years: 15.00  . Types: Cigarettes  . Quit date: 05/03/1978  . Years since quitting: 40.9  Smokeless Tobacco Former User  Tobacco Comment   started smoking at age 15    Goals Met:  Proper associated with RPD/PD & O2 Sat Exercise tolerated well Queuing for purse lip breathing Strength training completed today  Goals Unmet:  Not Applicable  Comments: First full day of exercise!  Patient was oriented to gym and equipment including functions, settings, policies, and procedures.  Patient's individual exercise prescription and treatment plan were reviewed.  All starting workloads were established based on the results of the 6 minute walk test done at initial orientation visit.  The plan for exercise progression was also introduced and progression will be customized based on patient's performance and goals.     Dr. Mark Miller is Medical Director for  [facility] and [facility]. 

## 2019-03-23 ENCOUNTER — Other Ambulatory Visit: Payer: Self-pay

## 2019-03-23 ENCOUNTER — Encounter: Payer: Self-pay | Admitting: Family Medicine

## 2019-03-23 ENCOUNTER — Ambulatory Visit (INDEPENDENT_AMBULATORY_CARE_PROVIDER_SITE_OTHER): Payer: Commercial Managed Care - PPO | Admitting: Family Medicine

## 2019-03-23 ENCOUNTER — Encounter: Payer: Commercial Managed Care - PPO | Admitting: *Deleted

## 2019-03-23 VITALS — BP 150/66 | HR 74 | Temp 97.5°F | Wt 283.4 lb

## 2019-03-23 DIAGNOSIS — N2581 Secondary hyperparathyroidism of renal origin: Secondary | ICD-10-CM

## 2019-03-23 DIAGNOSIS — I4891 Unspecified atrial fibrillation: Secondary | ICD-10-CM | POA: Diagnosis not present

## 2019-03-23 DIAGNOSIS — R7989 Other specified abnormal findings of blood chemistry: Secondary | ICD-10-CM | POA: Diagnosis not present

## 2019-03-23 DIAGNOSIS — J189 Pneumonia, unspecified organism: Secondary | ICD-10-CM | POA: Diagnosis not present

## 2019-03-23 DIAGNOSIS — G4733 Obstructive sleep apnea (adult) (pediatric): Secondary | ICD-10-CM

## 2019-03-23 DIAGNOSIS — J9601 Acute respiratory failure with hypoxia: Secondary | ICD-10-CM

## 2019-03-23 LAB — GLUCOSE, CAPILLARY
Glucose-Capillary: 167 mg/dL — ABNORMAL HIGH (ref 70–99)
Glucose-Capillary: 138 mg/dL — ABNORMAL HIGH (ref 70–99)

## 2019-03-23 MED ORDER — TESTOSTERONE CYPIONATE 200 MG/ML IM SOLN: 200.0000 mg | INTRAMUSCULAR | 1 refills | Status: DC

## 2019-03-23 MED ORDER — METOPROLOL TARTRATE 50 MG PO TABS: 25.0000 mg | ORAL_TABLET | Freq: Two times a day (BID) | ORAL | Status: DC

## 2019-03-23 NOTE — Progress Notes (Signed)
Daily Session Note  Patient Details  Name: Patrick Hurst MRN: [id number] Date of Birth: 1955-08-24 Referring Provider:     Pulmonary Rehab from 03/20/2019 in [facility] Cardiac and Pulmonary Rehab  Referring Provider  Lujean Amel MD      Encounter Date: 03/23/2019  Check In: Session Check In - 03/23/19 0747      Check-In   Supervising physician immediately available to respond to emergencies  See telemetry face sheet for immediately available ER MD    Location  [facility]Cardiac & Pulmonary Rehab    Staff Present  Heath Lark, RN, BSN, CCRP;Amanda Sommer, BA, ACSM CEP, Exercise Physiologist    Virtual Visit  No    Medication changes reported      No    Fall or balance concerns reported     No    Warm-up and Cool-down  Performed on first and last piece of equipment    Resistance Training Performed  Yes    VAD Patient?  No    PAD/SET Patient?  No      Pain Assessment   Currently in Pain?  No/denies          Social History   Tobacco Use  Smoking Status Former Smoker  . Packs/day: 1.00  . Years: 15.00  . Pack years: 15.00  . Types: Cigarettes  . Quit date: 05/03/1978  . Years since quitting: 40.9  Smokeless Tobacco Former Systems developer  Tobacco Comment   started smoking at age 46    Goals Met:  Proper associated with RPD/PD & O2 Sat Exercise tolerated well No report of cardiac concerns or symptoms  Goals Unmet:  Not Applicable  Comments: Pt able to follow exercise prescription today without complaint.  Will continue to monitor for progression.  Reported Fluid pill has been stopped  Dr. Emily Filbert is Medical Director for [place] and [facility].

## 2019-03-23 NOTE — Progress Notes (Signed)
Patient: Patrick Hurst Male    DOB: Nov 10, 1955   63 y.o.   MRN: [id number] Visit Date: 03/23/2019  Today's Provider: Lelon Huh, MD   Chief Complaint  Patient presents with  . Hospitalization Follow-up   Subjective:     HPI  Follow up Hospitalization  Patient was admitted to [facility] on 03/01/2019 and discharged on 03/09/2019. He was treated for acute respiratory failure with hypoxia, pneumonia, CKD, CHF new onset Atrial Fibrillation. Treatment for this included 7 day course of antibiotics, IV diuretics. Patient was discharged on increased dose of torsemide and advised to closely follow-up with nephrology. For new onset Atrial Fibrillation patient was started on amiodarone, diltiazem, and Lopressor.  He was started on heparin, transitioned to Eliquis. Telephone follow up was done on 03/12/2019 He reports good compliance with treatment. He reports this condition is slightly improved. . He has follow up with pulmonary on 04-02-2019 and states cardiologist wants to see him every six months. He is doing pulmonary rehab which he did earlier today. He states his breathing is greatly improved but still feels fatigues. He states cardiologist and pulmonologist thought he could return to work when he gets his energy back.  ------------------------------------------------------------------------------------    Allergies  Allergen Reactions  . Guaifenesin Swelling    Throat swelling, increased heart rate.  . Mucinex  [Guaifenesin Er]     Throat swelling, increases heart rate  . Levaquin [Levofloxacin] Palpitations     Current Outpatient Medications:  .  albuterol (VENTOLIN HFA) 108 (90 Base) MCG/ACT inhaler, USE 2 INHALATIONS EVERY 4 HOURS AS NEEDED FOR WHEEZING OR SHORTNESS OF BREATH, Disp: 8.5 g, Rfl: 10 .  amiodarone (PACERONE) 200 MG tablet, Take 400 mg (two tabs) twice daily for 5 more days, then take 200 mg (1 tab) daily, Disp: 45 tablet, Rfl: 0 .  amitriptyline (ELAVIL)  25 MG tablet, Take 1 tablet (25 mg total) by mouth at bedtime., Disp: 90 tablet, Rfl: 3 .  amLODipine (NORVASC) 5 MG tablet, Take 1 tablet (5 mg total) by mouth daily., Disp: 30 tablet, Rfl: 3 .  apixaban (ELIQUIS) 5 MG TABS tablet, Take 1 tablet (5 mg total) by mouth 2 (two) times daily., Disp: 60 tablet, Rfl: 3 .  Ascorbic Acid (VITAMIN C) 1000 MG tablet, Take 1,000 mg by mouth 2 (two) times daily. , Disp: , Rfl:  .  atorvastatin (LIPITOR) 40 MG tablet, Take 1 tablet (40 mg total) by mouth daily., Disp: 90 tablet, Rfl: 4 .  calcitRIOL (ROCALTROL) 0.5 MCG capsule, Take 0.5 mcg by mouth daily., Disp: , Rfl:  .  cetirizine (ZYRTEC) 10 MG tablet, Take 10 mg by mouth daily. , Disp: , Rfl:  .  fluticasone furoate-vilanterol (BREO ELLIPTA) 100-25 MCG/INH AEPB, Inhale 1 puff into the lungs daily., Disp: 180 each, Rfl: 3 .  hydrALAZINE (APRESOLINE) 25 MG tablet, TAKE 1 TABLET THREE TIMES A DAY, Disp: 270 tablet, Rfl: 4 .  insulin regular human CONCENTRATED (HUMULIN R) 500 UNIT/ML injection, Inject up to 25 units twice a day, or as directed by physician, Disp: 60 mL, Rfl: 3 .  ipratropium-albuterol (DUONEB) 0.5-2.5 (3) MG/3ML SOLN, INHALE 3 ML BY NEBULIZATION EVERY 4 HOURS AS NEEDED, Disp: 360 mL, Rfl: 8 .  levothyroxine (SYNTHROID) 100 MCG tablet, Take 1 tablet (100 mcg total) by mouth daily., Disp: 90 tablet, Rfl: 3 .  metoprolol tartrate (LOPRESSOR) 50 MG tablet, Take 1 tablet (50 mg total) by mouth 2 (two) times daily., Disp: 60  tablet, Rfl: 3 .  Misc. Devices (PULSE OXIMETER FOR FINGER) MISC, 1 Device by Does not apply route daily as needed., Disp: 1 each, Rfl: 0 .  pantoprazole (PROTONIX) 40 MG tablet, TAKE 1 TABLET TWICE A DAY, Disp: 180 tablet, Rfl: 3 .  testosterone cypionate (DEPO-TESTOSTERONE) 200 MG/ML injection, Inject 1 mL (200 mg total) into the muscle every 14 (fourteen) days., Disp: 1 mL, Rfl: 5 .  torsemide (DEMADEX) 20 MG tablet, Take 2 tablets (40 mg total) by mouth 2 (two) times daily.,  Disp: 60 tablet, Rfl: 3 .  ULORIC 80 MG TABS, Take 80 mg by mouth daily. , Disp: , Rfl:  .  vitamin B-12 (CYANOCOBALAMIN) 1000 MCG tablet, Take 1,000 mcg by mouth daily., Disp: , Rfl:   Review of Systems  Constitutional: Negative for appetite change, chills and fever.  Respiratory: Negative for chest tightness, shortness of breath and wheezing.   Cardiovascular: Negative for chest pain and palpitations.  Gastrointestinal: Negative for abdominal pain, nausea and vomiting.    Social History   Tobacco Use  . Smoking status: Former Smoker    Packs/day: 1.00    Years: 15.00    Pack years: 15.00    Types: Cigarettes    Quit date: 05/03/1978    Years since quitting: 40.9  . Smokeless tobacco: Former Systems developer  . Tobacco comment: started smoking at age 87  Substance Use Topics  . Alcohol use: No    Alcohol/week: 0.0 standard drinks      Objective:   BP (!) 150/66 (BP Location: Right Arm, Patient Position: Sitting, Cuff Size: Large)   Pulse 74   Temp (!) 97.5 F (36.4 C) (Temporal)   Wt 283 lb 6.4 oz (128.5 kg)   SpO2 95%   BMI 39.98 kg/m  Vitals:   03/23/19 1441  BP: (!) 150/66  Pulse: 74  Temp: (!) 97.5 F (36.4 C)  TempSrc: Temporal  SpO2: 95%  Weight: 283 lb 6.4 oz (128.5 kg)  Body mass index is 39.98 kg/m.   Physical Exam   General Appearance:    Overweight male in no acute distress  Eyes:    PERRL, conjunctiva/corneas clear, EOM's intact       Lungs:     Clear to auscultation bilaterally, respirations unlabored  Heart:    Normal heart rate. Regular rhythm. No murmurs, rubs, or gallops.   MS:   All extremities are intact.   Neurologic:   Awake, alert, oriented x 3. No apparent focal neurological           defect.           Assessment & Plan     1. Multifocal pneumonia No sign of persistent infections. Still very fatigued but otherwise greatly improved. Expect him to be able to return to work after another week. Completed short term disability forms to [employer]  04-02-2019  2. Acute respiratory failure with hypoxia (HCC) Secondary to pneumonia, chf and a-fib, now greatly improved. Continue pulmonary rehab. Follow up pulmonary as scheduled.   3. Low testosterone  - testosterone cypionate (DEPO-TESTOSTERONE) 200 MG/ML injection; Inject 1 mL (200 mg total) into the muscle every 14 (fourteen) days.  Dispense: 6 mL; Refill: 1  4. Atrial fibrillation with RVR ([place]) Doing well on amiodarone and metoprolol, rate well controlled. Continue Eliquis for now. Recheck here in 3 months, cardiology in 5 months.   5. Secondary hyperparathyroidism of renal origin ([place]) Follow up renal as scheduled.   The entirety of the information documented  in the History of Present Illness, Review of Systems and Physical Exam were personally obtained by me. Portions of this information were initially documented by Idelle Jo, CMA and reviewed by me for thoroughness and accuracy.     Lelon Huh, MD  [facility]

## 2019-03-23 NOTE — Patient Instructions (Signed)
.   Please review the attached list of medications and notify my office if there are any errors.   . Please bring all of your medications to every appointment so we can make sure that our medication list is the same as yours.   . It is especially important to get the annual flu vaccine this year. If you haven't had it already, please go to your pharmacy or call the office as soon as possible to schedule you flu shot.  

## 2019-03-26 ENCOUNTER — Encounter: Payer: Commercial Managed Care - PPO | Admitting: *Deleted

## 2019-03-26 ENCOUNTER — Other Ambulatory Visit: Payer: Self-pay

## 2019-03-26 DIAGNOSIS — G4733 Obstructive sleep apnea (adult) (pediatric): Secondary | ICD-10-CM | POA: Diagnosis not present

## 2019-03-26 NOTE — Progress Notes (Signed)
Daily Session Note  Patient Details  Name: Patrick Hurst MRN: [id number] Date of Birth: 1956-01-05 Referring Provider:     Pulmonary Rehab from 03/20/2019 in [facility] Card[facility] and Pulmonary Rehab  Referring Provider  Lujean Amel MD      Encounter Date: 03/26/2019  Check In: Session Check In - 03/26/19 0739      Check-In   Supervising physician immediately available to respond to emergencies  See telemetry face sheet for immediately available ER MD    Location  [facility]-Cardiac & Pulmonary Rehab    Staff Present  Heath Lark, RN, BSN, Laveda Norman, BS, ACSM CEP, Exercise Physiologist    Virtual Visit  No    Medication changes reported      No    Fall or balance concerns reported     No    Warm-up and Cool-down  Performed on first and last piece of equipment    Resistance Training Performed  Yes    VAD Patient?  No    PAD/SET Patient?  No      Pain Assessment   Currently in Pain?  No/denies          Social History   Tobacco Use  Smoking Status Former Smoker  . Packs/day: 1.00  . Years: 15.00  . Pack years: 15.00  . Types: Cigarettes  . Quit date: 05/03/1978  . Years since quitting: 40.9  Smokeless Tobacco Former Systems developer  Tobacco Comment   started smoking at age 81    Goals Met:  Exercise tolerated well No report of cardiac concerns or symptoms  Goals Unmet:  Not Applicable  Comments: Pt able to follow exercise prescription today without complaint.  Will continue to monitor for progression. Weight up 2 pounds. Fluid pill stopped last week.  Has appt with MD on Wed this week.  He will call MD if weight continues to increase.   Dr. Emily Filbert is Medical Director for [place] and [facility].

## 2019-03-28 ENCOUNTER — Encounter: Payer: Commercial Managed Care - PPO | Admitting: *Deleted

## 2019-03-28 ENCOUNTER — Other Ambulatory Visit: Payer: Self-pay

## 2019-03-28 DIAGNOSIS — G4733 Obstructive sleep apnea (adult) (pediatric): Secondary | ICD-10-CM

## 2019-03-28 NOTE — Progress Notes (Signed)
Daily Session Note  Patient Details  Name: Patrick Hurst MRN: [id number] Date of Birth: [birth year] Referring Provider:     Pulmonary Rehab from 03/20/2019 in [facility] Cardiac and Pulmonary Rehab  Referring Provider  Lujean Amel MD      Encounter Date: 03/28/2019  Check In: Session Check In - 03/28/19 0733      Check-In   Supervising physician immediately available to respond to emergencies  See telemetry face sheet for immediately available ER MD    Location  [facility] & Pulmonary Rehab    Staff Present  Heath Lark, RN, BSN, CCRP;Amanda Sommer, BA, ACSM CEP, Exercise Physiologist    Virtual Visit  No    Medication changes reported      No    Fall or balance concerns reported     No    Warm-up and Cool-down  Performed on first and last piece of equipment    Resistance Training Performed  Yes    VAD Patient?  No    PAD/SET Patient?  No          Social History   Tobacco Use  Smoking Status Former Smoker  . Packs/day: 1.00  . Years: 15.00  . Pack years: 15.00  . Types: Cigarettes  . Quit date: 05/03/1978  . Years since quitting: 40.9  Smokeless Tobacco Former Systems developer  Tobacco Comment   started smoking at age 26    Goals Met:  Exercise tolerated well No report of cardiac concerns or symptoms  Goals Unmet:  Not Applicable  Comments: Pt able to follow exercise prescription today without complaint.  Will continue to monitor for progression. Weight up. Has appt with Kidney MD today and plans to talk about weight gain.Had called office and no response as of this AM Reviewed home exercise with pt today.  Pt plans to use TM at home for exercise.  Reviewed THR, pulse, RPE, sign and symptoms, NTG use, and when to call 911 or MD.  Also discussed weather considerations and indoor options.  Pt voiced understanding.    Dr. Emily Filbert is Medical Director for [place] and [facility].

## 2019-04-02 ENCOUNTER — Other Ambulatory Visit: Payer: Self-pay

## 2019-04-02 ENCOUNTER — Ambulatory Visit: Payer: Commercial Managed Care - PPO | Admitting: Pulmonary Disease

## 2019-04-02 ENCOUNTER — Other Ambulatory Visit: Payer: Self-pay | Admitting: Pulmonary Disease

## 2019-04-02 ENCOUNTER — Encounter: Payer: Self-pay | Admitting: Pulmonary Disease

## 2019-04-02 ENCOUNTER — Ambulatory Visit (INDEPENDENT_AMBULATORY_CARE_PROVIDER_SITE_OTHER): Payer: Commercial Managed Care - PPO | Admitting: Pulmonary Disease

## 2019-04-02 VITALS — Ht 71.0 in | Wt 278.0 lb

## 2019-04-02 DIAGNOSIS — R911 Solitary pulmonary nodule: Secondary | ICD-10-CM | POA: Diagnosis not present

## 2019-04-02 DIAGNOSIS — G4733 Obstructive sleep apnea (adult) (pediatric): Secondary | ICD-10-CM | POA: Diagnosis not present

## 2019-04-02 DIAGNOSIS — J018 Other acute sinusitis: Secondary | ICD-10-CM

## 2019-04-02 DIAGNOSIS — J9611 Chronic respiratory failure with hypoxia: Secondary | ICD-10-CM

## 2019-04-02 DIAGNOSIS — J449 Chronic obstructive pulmonary disease, unspecified: Secondary | ICD-10-CM

## 2019-04-02 MED ORDER — AZITHROMYCIN 250 MG PO TABS: ORAL_TABLET | ORAL | 0 refills | Status: AC

## 2019-04-02 NOTE — Patient Instructions (Addendum)
Zithromax 250 mg pill >> 2 pills on day 1, then 1 pill daily for next 4 days  Will arrange for new Bipap supplies  Will get copy of your Bipap report and arrange for overnight oximetry with Bipap and 2 liters oxygen  Will arrange for CT chest w/o contrast for February 2021  Follow up in 3 to 4 months in [facility]

## 2019-04-02 NOTE — Progress Notes (Signed)
[facility]  Chief Complaint  Patient presents with   COPD, OSA (obstructive sleep apnea)    Constitutional:  Ht 5\' 11"  (1.803 m)    Wt 278 lb (126.1 kg)    BMI 38.77 kg/m   Past Medical History:  Gastritis, DM, HLD, HTN, Hypothyroidism, A fib, CKD, PNA, Secondary hyperparathyroidism, ANA positive, Psoriasis, Osteoarthritis, Thrombocytopenia, Liver cirrhosis, Vit D deficiency, Gout, GERD, Depression, Bell's palsy, Anemia  Brief Summary:  Patrick Hurst is a 63 y.o. male former smoker with COPD, obstructive sleep apnea on Bipap, and chronic hypoxic respiratory failure on 2 liters oxygen at night.  Virtual Visit via Telephone Note  I connected with Patrick Hurst on 04/02/19 at 11:30 AM EST by telephone and verified that I am speaking with the correct person using two identifiers.  Location: Patient: home Provider: medical office   I discussed the limitations, risks, security and privacy concerns of performing an evaluation and management service by telephone and the availability of in person appointments. I also discussed with the patient that there may be a patient responsible charge related to this service. The patient expressed understanding and agreed to proceed.  Previously followed by Dr. Alva Garnet.  Has noticed more sinus congestion, cough and sputum.  Not having fever, wheeze, chest tightness, hemoptysis.  Denies loss of smell/taste, or diarrhea.  No know COVID exposures.  Uses Breo daily.  Has been using ventolin and duoneb daily >> thought he had to do this.  Had sleep study years ago.  Has Bipap and 2 liters oxygen at night.  Reports using machine.  Uses [employer] for DME.  Hasn't received supplies for a while.  He is only using O2 at night.  Was told by [employer] he had to keep all his supplies for O2 including daytime supplies he isn't using, or he would have to get all supplies removed.  He wasn't told by [facility] that he needed a  repeat sleep study for insurance coverage.   Physical Exam:   Deferred.   Assessment/Plan:   COPD with chronic bronchitis. - continue breo - advised him that ventolin and duoneb only need to be used as needed, and discussed when he should use these  Obstructive sleep apnea. - reports using Bipap - will have [place] arrange for new Bipap mask and supplies, and send copy of his last 30 days Bipap download  Chronic respiratory failure with hypoxia. - likely from combination of COPD and OSA/OHS - continue 2 liters oxygen at night with Bipap - will have [employer] remove his daytime oxygen supplies, and just leave the oxygen concentrator to use at night with Bipap  Lung nodules. - follow up CT chest w/o contrast in February 2021  Hx of PAF, diastolic CHF. - followed by Dr. Clayborn Bigness with cardiology in [facility]   CKD 5. - followed by Dr. Murlean Iba  Acute sinusitis with bronchitis. - will give him course of zithromax   Patient Instructions  Zithromax 250 mg pill >> 2 pills on day 1, then 1 pill daily for next 4 days  Will arrange for new Bipap supplies  Will get copy of your Bipap report and arrange for overnight oximetry with Bipap and 2 liters oxygen  Will arrange for CT chest w/o contrast for February 2021  Follow up in 3 to 4 months in [facility]   I discussed the assessment and treatment plan with the patient. The patient was provided an opportunity to ask questions and all  were answered. The patient agreed with the plan and demonstrated an understanding of the instructions.   The patient was advised to call back or seek an in-person evaluation if the symptoms worsen or if the condition fails to improve as anticipated.  I provided 29 minutes of non-face-to-face time during this encounter.   Chesley Mires, MD [facility] Pager: [phone] 04/02/2019, 10:10 AM  Flow Sheet     Pulmonary tests:  A1AT 07/14/15 >> 151 PFT 08/03/18 >> FEV1  1.91 (52%), FEV1% 61, TLC 6.88 (95%), DLCO 73%  Chest imaging:  V/Q scan 06/26/18 >> normal CT chest 08/03/18 >> ATX, 12 mm GGO nodule RUL, 7 mm GGO nodule superior segment LLL  Sleep tests:    Cardiac tests:  Echo 03/06/19 >> EF 55 to 60%, mild LVH, mild MR  Medications:   Allergies as of 04/02/2019      Reactions   Guaifenesin Swelling   Throat swelling, increased heart rate.   Mucinex  [guaifenesin Er]    Throat swelling, increases heart rate   Levaquin [levofloxacin] Palpitations      Medication List       Accurate as of April 02, 2019 10:10 AM. If you have any questions, ask your nurse or doctor.        albuterol 108 (90 Base) MCG/ACT inhaler Commonly known as: VENTOLIN HFA USE 2 INHALATIONS EVERY 4 HOURS AS NEEDED FOR WHEEZING OR SHORTNESS OF BREATH   amiodarone 200 MG tablet Commonly known as: PACERONE Take 400 mg (two tabs) twice daily for 5 more days, then take 200 mg (1 tab) daily   amitriptyline 25 MG tablet Commonly known as: ELAVIL Take 1 tablet (25 mg total) by mouth at bedtime.   amLODipine 5 MG tablet Commonly known as: NORVASC Take 1 tablet (5 mg total) by mouth daily.   atorvastatin 40 MG tablet Commonly known as: LIPITOR Take 1 tablet (40 mg total) by mouth daily.   azithromycin 250 MG tablet Commonly known as: ZITHROMAX Take 2 tablets (500 mg total) by mouth daily for 1 day, THEN 1 tablet (250 mg total) daily for 4 days. Start taking on: April 02, 2019 Started by: Chesley Mires, MD   [place] Patter 100-25 MCG/INH Aepb Generic drug: fluticasone furoate-vilanterol Inhale 1 puff into the lungs daily.   calcitRIOL 0.5 MCG capsule Commonly known as: ROCALTROL Take 0.5 mcg by mouth daily.   cetirizine 10 MG tablet Commonly known as: ZYRTEC Take 10 mg by mouth daily.   Eliquis 2.5 MG Tabs tablet Generic drug: apixaban SMARTSIG:1 Tablet(s) By Mouth Every 12 Hours What changed: Another medication with the same name was removed.  Continue taking this medication, and follow the directions you see here. Changed by: Chesley Mires, MD   HUMULIN R 500 UNIT/ML injection Generic drug: insulin regular human CONCENTRATED Inject up to 25 units twice a day, or as directed by physician   hydrALAZINE 25 MG tablet Commonly known as: APRESOLINE TAKE 1 TABLET THREE TIMES A DAY   ipratropium-albuterol 0.5-2.5 (3) MG/3ML Soln Commonly known as: DUONEB INHALE 3 ML BY NEBULIZATION EVERY 4 HOURS AS NEEDED   levothyroxine 100 MCG tablet Commonly known as: SYNTHROID Take 1 tablet (100 mcg total) by mouth daily.   metoprolol tartrate 50 MG tablet Commonly known as: LOPRESSOR Take 0.5 tablets (25 mg total) by mouth 2 (two) times daily.   pantoprazole 40 MG tablet Commonly known as: PROTONIX TAKE 1 TABLET TWICE A DAY   Pulse Oximeter For Finger Misc 1  Device by Does not apply route daily as needed.   testosterone cypionate 200 MG/ML injection Commonly known as: Depo-Testosterone Inject 1 mL (200 mg total) into the muscle every 14 (fourteen) days.   Uloric 80 MG Tabs Generic drug: Febuxostat Take 80 mg by mouth daily.   vitamin B-12 1000 MCG tablet Commonly known as: CYANOCOBALAMIN Take 1,000 mcg by mouth daily.   vitamin C 1000 MG tablet Take 1,000 mg by mouth 2 (two) times daily.       Past Surgical History:  He  has a past surgical history that includes Gallbladder surgery; Shoulder surgery (Right, 2012); Cholecystectomy (04/28/2011); Cataract extraction (Bilateral, 2014 and 2015); and LASIK (Bilateral, 2019).  Family History:  His family history includes COPD in his mother; Heart disease in his father.  Social History:  He  reports that he quit smoking about 40 years ago. His smoking use included cigarettes. He has a 15.00 pack-year smoking history. He has quit using smokeless tobacco. He reports that he does not drink alcohol or use drugs.

## 2019-04-03 ENCOUNTER — Telehealth: Payer: Self-pay | Admitting: Pulmonary Disease

## 2019-04-03 ENCOUNTER — Telehealth: Payer: Commercial Managed Care - PPO | Admitting: Family Medicine

## 2019-04-03 ENCOUNTER — Telehealth: Payer: Self-pay | Admitting: Family Medicine

## 2019-04-03 ENCOUNTER — Encounter: Payer: Commercial Managed Care - PPO | Attending: Internal Medicine

## 2019-04-03 ENCOUNTER — Other Ambulatory Visit: Payer: Self-pay

## 2019-04-03 DIAGNOSIS — I130 Hypertensive heart and chronic kidney disease with heart failure and stage 1 through stage 4 chronic kidney disease, or unspecified chronic kidney disease: Secondary | ICD-10-CM | POA: Insufficient documentation

## 2019-04-03 DIAGNOSIS — E039 Hypothyroidism, unspecified: Secondary | ICD-10-CM | POA: Insufficient documentation

## 2019-04-03 DIAGNOSIS — Z7901 Long term (current) use of anticoagulants: Secondary | ICD-10-CM | POA: Insufficient documentation

## 2019-04-03 DIAGNOSIS — E1122 Type 2 diabetes mellitus with diabetic chronic kidney disease: Secondary | ICD-10-CM | POA: Insufficient documentation

## 2019-04-03 DIAGNOSIS — Z87891 Personal history of nicotine dependence: Secondary | ICD-10-CM | POA: Insufficient documentation

## 2019-04-03 DIAGNOSIS — N189 Chronic kidney disease, unspecified: Secondary | ICD-10-CM | POA: Insufficient documentation

## 2019-04-03 DIAGNOSIS — Z794 Long term (current) use of insulin: Secondary | ICD-10-CM | POA: Insufficient documentation

## 2019-04-03 DIAGNOSIS — M109 Gout, unspecified: Secondary | ICD-10-CM | POA: Insufficient documentation

## 2019-04-03 DIAGNOSIS — G4733 Obstructive sleep apnea (adult) (pediatric): Secondary | ICD-10-CM | POA: Insufficient documentation

## 2019-04-03 DIAGNOSIS — Z79890 Hormone replacement therapy (postmenopausal): Secondary | ICD-10-CM | POA: Insufficient documentation

## 2019-04-03 DIAGNOSIS — Z79899 Other long term (current) drug therapy: Secondary | ICD-10-CM | POA: Insufficient documentation

## 2019-04-03 DIAGNOSIS — F329 Major depressive disorder, single episode, unspecified: Secondary | ICD-10-CM | POA: Insufficient documentation

## 2019-04-03 DIAGNOSIS — E785 Hyperlipidemia, unspecified: Secondary | ICD-10-CM | POA: Insufficient documentation

## 2019-04-03 DIAGNOSIS — I509 Heart failure, unspecified: Secondary | ICD-10-CM | POA: Insufficient documentation

## 2019-04-03 DIAGNOSIS — K219 Gastro-esophageal reflux disease without esophagitis: Secondary | ICD-10-CM | POA: Insufficient documentation

## 2019-04-03 MED ORDER — DOXYCYCLINE HYCLATE 100 MG PO TABS: 100.0000 mg | ORAL_TABLET | Freq: Two times a day (BID) | ORAL | 0 refills | Status: DC

## 2019-04-03 NOTE — Telephone Encounter (Signed)
Spoke with the pt and notified of recs per VS  Pt verbalized understanding  Rx for doxy was sent

## 2019-04-03 NOTE — Telephone Encounter (Signed)
Spoke with the pt  He states pharm would not fill the zpack due to him taking amiodarone and the possibility of abnormal heart rhythm  Please see assessment and plan and advise recs on another abx he can take, thanks!    Assessment/Plan:   COPD with chronic bronchitis. - continue breo - advised him that ventolin and duoneb only need to be used as needed, and discussed when he should use these  Obstructive sleep apnea. - reports using Bipap - will have [place] arrange for new Bipap mask and supplies, and send copy of his last 30 days Bipap download  Chronic respiratory failure with hypoxia. - likely from combination of COPD and OSA/OHS - continue 2 liters oxygen at night with Bipap - will have [employer] remove his daytime oxygen supplies, and just leave the oxygen concentrator to use at night with Bipap  Lung nodules. - follow up CT chest w/o contrast in February 2021  Hx of PAF, diastolic CHF. - followed by Dr. Clayborn Bigness with cardiology in [facility]   CKD 5. - followed by Dr. Murlean Iba  Acute sinusitis with bronchitis. - will give him course of zithromax   Patient Instructions  Zithromax 250 mg pill >> 2 pills on day 1, then 1 pill daily for next 4 days  Will arrange for new Bipap supplies  Will get copy of your Bipap report and arrange for overnight oximetry with Bipap and 2 liters oxygen  Will arrange for CT chest w/o contrast for February 2021  Follow up in 3 to 4 months in [facility]   I discussed the assessment and treatment plan with the patient. The patient was provided an opportunity to ask questions and all were answered. The patient agreed with the plan and demonstrated an understanding of the instructions.  The patient was advised to call back or seek an in-person evaluation if the symptoms worsen or if the condition fails to improve as anticipated.  I provided 29 minutes of non-face-to-face time during this encounter.   Chesley Mires,  MD [place] Pager: [phone] 04/02/2019, 10:10 AM Allergies  Allergen Reactions  . Guaifenesin Swelling    Throat swelling, increased heart rate.  . Mucinex  [Guaifenesin Er]     Throat swelling, increases heart rate  . Levaquin [Levofloxacin] Palpitations

## 2019-04-03 NOTE — Telephone Encounter (Signed)
Okay , was treated for bronchitis   D/c Zpack  Begin Doxycycline 100mg  Twice daily  For 7 days take with food.  Cont w/ rest of ov recs  Please contact office for sooner follow up if symptoms do not improve or worsen or seek emergency care

## 2019-04-03 NOTE — Telephone Encounter (Signed)
That's fine, can excuse 02/28/2019 to 04/04/2019 and return to work on light duty 04/05/2019

## 2019-04-03 NOTE — Telephone Encounter (Signed)
From [facility] 

## 2019-04-03 NOTE — Progress Notes (Signed)
Completed initial RD eval 

## 2019-04-03 NOTE — Telephone Encounter (Signed)
Patient requesting Dr.Note excusing him from work from 02/28/2019 to 04/04/2019 due to upper respiratory concerns and would like the doctor note to reflect light duty due to exhausting himself, patient would like a follow up call today

## 2019-04-03 NOTE — Progress Notes (Signed)
Letter printed at the front desk for pick up. Patient advised.

## 2019-04-04 ENCOUNTER — Encounter: Payer: Self-pay | Admitting: *Deleted

## 2019-04-04 ENCOUNTER — Ambulatory Visit: Payer: Commercial Managed Care - PPO | Admitting: Family

## 2019-04-04 DIAGNOSIS — G4733 Obstructive sleep apnea (adult) (pediatric): Secondary | ICD-10-CM

## 2019-04-04 NOTE — Progress Notes (Signed)
Pulmonary Individual Treatment Plan  Patient Details  Name: Patrick Hurst MRN: [id number] Date of Birth: 07-Jul-1955 Referring Provider:     Pulmonary Rehab from 03/20/2019 in [facility] Cardiac and Pulmonary Rehab  Referring Provider  Lujean Amel MD      Initial Encounter Date:    Pulmonary Rehab from 03/20/2019 in [facility] Cardiac and Pulmonary Rehab  Date  03/20/19      Visit Diagnosis: OSA (obstructive sleep apnea)  Patient's Home Medications on Admission:  Current Outpatient Medications:  .  albuterol (VENTOLIN HFA) 108 (90 Base) MCG/ACT inhaler, USE 2 INHALATIONS EVERY 4 HOURS AS NEEDED FOR WHEEZING OR SHORTNESS OF BREATH, Disp: 8.5 g, Rfl: 10 .  amiodarone (PACERONE) 200 MG tablet, Take 400 mg (two tabs) twice daily for 5 more days, then take 200 mg (1 tab) daily, Disp: 45 tablet, Rfl: 0 .  amitriptyline (ELAVIL) 25 MG tablet, Take 1 tablet (25 mg total) by mouth at bedtime., Disp: 90 tablet, Rfl: 3 .  amLODipine (NORVASC) 5 MG tablet, Take 1 tablet (5 mg total) by mouth daily., Disp: 30 tablet, Rfl: 3 .  Ascorbic Acid (VITAMIN C) 1000 MG tablet, Take 1,000 mg by mouth 2 (two) times daily. , Disp: , Rfl:  .  atorvastatin (LIPITOR) 40 MG tablet, Take 1 tablet (40 mg total) by mouth daily., Disp: 90 tablet, Rfl: 4 .  azithromycin (ZITHROMAX) 250 MG tablet, Take 2 tablets (500 mg total) by mouth daily for 1 day, THEN 1 tablet (250 mg total) daily for 4 days., Disp: 6 tablet, Rfl: 0 .  calcitRIOL (ROCALTROL) 0.5 MCG capsule, Take 0.5 mcg by mouth daily., Disp: , Rfl:  .  cetirizine (ZYRTEC) 10 MG tablet, Take 10 mg by mouth daily. , Disp: , Rfl:  .  doxycycline (VIBRA-TABS) 100 MG tablet, Take 1 tablet (100 mg total) by mouth 2 (two) times daily., Disp: 14 tablet, Rfl: 0 .  ELIQUIS 2.5 MG TABS tablet, SMARTSIG:1 Tablet(s) By Mouth Every 12 Hours, Disp: , Rfl:  .  fluticasone furoate-vilanterol (BREO ELLIPTA) 100-25 MCG/INH AEPB, Inhale 1 puff into the lungs daily., Disp: 180 each, Rfl:  3 .  hydrALAZINE (APRESOLINE) 25 MG tablet, TAKE 1 TABLET THREE TIMES A DAY, Disp: 270 tablet, Rfl: 4 .  insulin regular human CONCENTRATED (HUMULIN R) 500 UNIT/ML injection, Inject up to 25 units twice a day, or as directed by physician, Disp: 60 mL, Rfl: 3 .  ipratropium-albuterol (DUONEB) 0.5-2.5 (3) MG/3ML SOLN, INHALE 3 ML BY NEBULIZATION EVERY 4 HOURS AS NEEDED, Disp: 360 mL, Rfl: 8 .  levothyroxine (SYNTHROID) 100 MCG tablet, Take 1 tablet (100 mcg total) by mouth daily., Disp: 90 tablet, Rfl: 3 .  metoprolol tartrate (LOPRESSOR) 50 MG tablet, Take 0.5 tablets (25 mg total) by mouth 2 (two) times daily., Disp: , Rfl:  .  Misc. Devices (PULSE OXIMETER FOR FINGER) MISC, 1 Device by Does not apply route daily as needed., Disp: 1 each, Rfl: 0 .  pantoprazole (PROTONIX) 40 MG tablet, TAKE 1 TABLET TWICE A DAY, Disp: 180 tablet, Rfl: 3 .  testosterone cypionate (DEPO-TESTOSTERONE) 200 MG/ML injection, Inject 1 mL (200 mg total) into the muscle every 14 (fourteen) days., Disp: 6 mL, Rfl: 1 .  ULORIC 80 MG TABS, Take 80 mg by mouth daily. , Disp: , Rfl:  .  vitamin B-12 (CYANOCOBALAMIN) 1000 MCG tablet, Take 1,000 mcg by mouth daily., Disp: , Rfl:   Past Medical History: Past Medical History:  Diagnosis Date  . Allergy   .  Anemia   . Arthritis   . Bell's palsy   . CHF (congestive heart failure) ([place])   . CKD (chronic kidney disease)   . Depression   . Diabetes ([place])   . Gastric ulcer   . GERD (gastroesophageal reflux disease)   . Gout   . History of chicken pox   . Hyperlipidemia   . Hypertension   . Hypothyroidism   . OSA (obstructive sleep apnea)   . Thyroid disease   . Vitamin D deficiency     Tobacco Use: Social History   Tobacco Use  Smoking Status Former Smoker  . Packs/day: 1.00  . Years: 15.00  . Pack years: 15.00  . Types: Cigarettes  . Quit date: 05/03/1978  . Years since quitting: 40.9  Smokeless Tobacco Former Systems developer  Tobacco Comment   started smoking at age 20     Labs: Recent Review [place] [facility] Cardiac and Pulmonary Rehab Latest Ref Rng & Units 11/18/2017 02/20/2018 06/21/2018 03/02/2019 03/03/2019   Cholestrol 100 - 199 mg/dL 125 101 - - -   LDLCALC 0 - 99 mg/dL Comment 35 - - -   HDL >39 mg/dL 20(L) 24(L) - - -   Trlycerides 0 - 149 mg/dL 449(H) 208(H) - - -   Hemoglobin A1c 4.8 - 5.6 % 5.1 5.6 - 6.1(H) -   PHART 7.350 - 7.450 - - - - 7.38   PCO2ART 32.0 - 48.0 mmHg - - - - 37   HCO3 20.0 - 28.0 mmol/L - - 24.8 - 21.9   ACIDBASEDEF 0.0 - 2.0 mmol/L - - 1.1 - 2.8(H)   O2SAT % - - 70.4 - 92.3       Pulmonary Assessment Scores: Pulmonary Assessment Scores    Row Name 03/20/19 1228         ADL UCSD   ADL Phase  Entry     SOB Score total  16     Rest  0     Walk  1     Stairs  3     Bath  0     Dress  0     Shop  1       CAT Score   CAT Score  12       mMRC Score   mMRC Score  1        UCSD: Self-administered rating of dyspnea associated with activities of daily living (ADLs) 6-point scale (0 = "not at all" to 5 = "maximal or unable to do because of breathlessness")  Scoring Scores range from 0 to 120.  Minimally important difference is 5 units  CAT: CAT can identify the health impairment of COPD patients and is better correlated with disease progression.  CAT has a scoring range of zero to 40. The CAT score is classified into four groups of low (less than 10), medium (10 - 20), high (21-30) and very high (31-40) based on the impact level of disease on health status. A CAT score over 10 suggests significant symptoms.  A worsening CAT score could be explained by an exacerbation, poor medication adherence, poor inhaler technique, or progression of COPD or comorbid conditions.  CAT MCID is 2 points  mMRC: mMRC (Modified Medical Research Council) Dyspnea Scale is used to assess the degree of baseline functional disability in patients of respiratory disease due to dyspnea. No minimal important difference is  established. A decrease in score of 1 point or greater is  considered a positive change.   Pulmonary Function Assessment: Pulmonary Function Assessment - 03/19/19 1204      Breath   Shortness of Breath  Yes;Panic with Shortness of Breath;Limiting activity       Exercise Target Goals: Exercise Program Goal: Individual exercise prescription set using results from initial 6 min walk test and THRR while considering  patient's activity barriers and safety.   Exercise Prescription Goal: Initial exercise prescription builds to 30-45 minutes a day of aerobic activity, 2-3 days per week.  Home exercise guidelines will be given to patient during program as part of exercise prescription that the participant will acknowledge.  Activity Barriers & Risk Stratification: Activity Barriers & Cardiac Risk Stratification - 03/20/19 1219      Activity Barriers & Cardiac Risk Stratification   Activity Barriers  Arthritis;Joint Problems;Deconditioning;Muscular Weakness;Shortness of Breath;Balance Concerns       6 Minute Walk: 6 Minute Walk    Row Name 03/20/19 1215         6 Minute Walk   Phase  Initial     Distance  1060 feet     Walk Time  6 minutes     # of Rest Breaks  0     MPH  2.08     METS  2.72     RPE  8     Perceived Dyspnea   1     VO2 Peak  9.51     Symptoms  Yes (comment)     Comments  knee pain 5/10     Resting HR  67 bpm     Resting BP  152/74     Resting Oxygen Saturation   94 %     Exercise Oxygen Saturation  during 6 min walk  89 %     Max Ex. HR  109 bpm     Max Ex. BP  184/62     2 Minute Post BP  162/84       Interval HR   1 Minute HR  109     2 Minute HR  100     3 Minute HR  89     4 Minute HR  90     5 Minute HR  104     6 Minute HR  104     2 Minute Post HR  84     Interval Heart Rate?  Yes       Interval Oxygen   Interval Oxygen?  Yes     Baseline Oxygen Saturation %  94 %     1 Minute Oxygen Saturation %  91 %     1 Minute Liters of Oxygen  0 L  Room Air     2 Minute Oxygen Saturation %  89 %     2 Minute Liters of Oxygen  0 L     3 Minute Oxygen Saturation %  89 %     3 Minute Liters of Oxygen  0 L     4 Minute Oxygen Saturation %  89 %     4 Minute Liters of Oxygen  0 L     5 Minute Oxygen Saturation %  91 %     5 Minute Liters of Oxygen  0 L     6 Minute Oxygen Saturation %  97 %     6 Minute Liters of Oxygen  0 L     2 Minute Post Oxygen Saturation %  96 %  2 Minute Post Liters of Oxygen  0 L       Oxygen Initial Assessment: Oxygen Initial Assessment - 03/19/19 1202      Home Oxygen   Home Oxygen Device  Home Concentrator    Sleep Oxygen Prescription  BiPAP    Liters per minute  2    Home Exercise Oxygen Prescription  None    Home at Rest Exercise Oxygen Prescription  None    Compliance with Home Oxygen Use  Yes      Initial 6 min Walk   Oxygen Used  None      Program Oxygen Prescription   Program Oxygen Prescription  None      Intervention   Short Term Goals  To learn and exhibit compliance with exercise, home and travel O2 prescription;To learn and understand importance of monitoring SPO2 with pulse oximeter and demonstrate accurate use of the pulse oximeter.;To learn and understand importance of maintaining oxygen saturations>88%;To learn and demonstrate proper pursed lip breathing techniques or other breathing techniques.;To learn and demonstrate proper use of respiratory medications    Long  Term Goals  Exhibits compliance with exercise, home and travel O2 prescription;Verbalizes importance of monitoring SPO2 with pulse oximeter and return demonstration;Exhibits proper breathing techniques, such as pursed lip breathing or other method taught during program session;Maintenance of O2 saturations>88%;Demonstrates proper use of MDI's;Compliance with respiratory medication       Oxygen Re-Evaluation:   Oxygen Discharge (Final Oxygen Re-Evaluation):   Initial Exercise Prescription: Initial Exercise  Prescription - 03/20/19 1200      Date of Initial Exercise RX and Referring Provider   Date  03/20/19    Referring Provider  Lujean Amel MD      Treadmill   MPH  2    Grade  0    Minutes  15    METs  2.53      NuStep   Level  2    SPM  80    Minutes  15    METs  2.5      Recumbant Elliptical   Level  1    RPM  50    Minutes  15    METs  2.5      Prescription Details   Frequency (times per week)  3    Duration  Progress to 30 minutes of continuous aerobic without signs/symptoms of physical distress      Intensity   THRR 40-80% of Max Heartrate  104-141    Ratings of Perceived Exertion  11-13    Perceived Dyspnea  0-4      Progression   Progression  Continue to progress workloads to maintain intensity without signs/symptoms of physical distress.      Resistance Training   Training Prescription  Yes    Weight  3 lb    Reps  10-15       Perform Capillary Blood Glucose checks as needed.  Exercise Prescription Changes: Exercise Prescription Changes    Row Name 03/20/19 1200 03/28/19 0700           Response to Exercise   Blood Pressure (Admit)  152/74  -      Blood Pressure (Exercise)  184/62  -      Blood Pressure (Exit)  162/84  -      Heart Rate (Admit)  67 bpm  -      Heart Rate (Exercise)  109 bpm  -      Heart Rate (Exit)  66 bpm  -      Oxygen Saturation (Admit)  94 %  -      Oxygen Saturation (Exercise)  89 %  -      Oxygen Saturation (Exit)  94 %  -      Rating of Perceived Exertion (Exercise)  8  -      Perceived Dyspnea (Exercise)  1  -      Symptoms  knee pain 5/10  -      Comments  walk test results  -        Home Exercise Plan   Plans to continue exercise at  -  Home (comment)      Frequency  -  Add 1 additional day to program exercise sessions.      Initial Home Exercises Provided  -  03/28/19         Exercise Comments: Exercise Comments    Row Name 03/21/19 0746 03/26/19 0741 03/28/19 0736       Exercise Comments  First full  day of exercise!  Patient was oriented to gym and equipment including functions, settings, policies, and procedures.  Patient's individual exercise prescription and treatment plan were reviewed.  All starting workloads were established based on the results of the 6 minute walk test done at initial orientation visit.  The plan for exercise progression was also introduced and progression will be customized based on patient's performance and goals.  Weight up 2 pounds. Fluid pill stopped last week.  Has appt with MD on Wed this week.  He will call MD if weight continues to increase.  Weight up. Has appt with Kidney MD today and plans to talk about weight gain.Had called office and no response as of this AM        Exercise Goals and Review: Exercise Goals    Row Name 03/20/19 1222             Exercise Goals   Increase Physical Activity  Yes       Intervention  Provide advice, education, support and counseling about physical activity/exercise needs.;Develop an individualized exercise prescription for aerobic and resistive training based on initial evaluation findings, risk stratification, comorbidities and participant's personal goals.       Expected Outcomes  Short Term: Attend rehab on a regular basis to increase amount of physical activity.;Long Term: Add in home exercise to make exercise part of routine and to increase amount of physical activity.;Long Term: Exercising regularly at least 3-5 days a week.       Increase Strength and Stamina  Yes       Intervention  Provide advice, education, support and counseling about physical activity/exercise needs.;Develop an individualized exercise prescription for aerobic and resistive training based on initial evaluation findings, risk stratification, comorbidities and participant's personal goals.       Expected Outcomes  Short Term: Increase workloads from initial exercise prescription for resistance, speed, and METs.;Short Term: Perform resistance training  exercises routinely during rehab and add in resistance training at home;Long Term: Improve cardiorespiratory fitness, muscular endurance and strength as measured by increased METs and functional capacity (6MWT)       Able to understand and use rate of perceived exertion (RPE) scale  Yes       Intervention  Provide education and explanation on how to use RPE scale       Expected Outcomes  Short Term: Able to use RPE daily in rehab to express subjective intensity level;Long Term:  Able to use RPE to guide intensity level when exercising independently       Knowledge and understanding of Target Heart Rate Range (THRR)  Yes       Intervention  Provide education and explanation of THRR including how the numbers were predicted and where they are located for reference       Expected Outcomes  Short Term: Able to state/look up THRR;Short Term: Able to use daily as guideline for intensity in rehab;Long Term: Able to use THRR to govern intensity when exercising independently       Able to check pulse independently  Yes       Intervention  Provide education and demonstration on how to check pulse in carotid and radial arteries.;Review the importance of being able to check your own pulse for safety during independent exercise       Expected Outcomes  Short Term: Able to explain why pulse checking is important during independent exercise;Long Term: Able to check pulse independently and accurately       Understanding of Exercise Prescription  Yes       Intervention  Provide education, explanation, and written materials on patient's individual exercise prescription       Expected Outcomes  Short Term: Able to explain program exercise prescription;Long Term: Able to explain home exercise prescription to exercise independently          Exercise Goals Re-Evaluation : Exercise Goals Re-Evaluation    Row Name 03/21/19 0746 03/28/19 0753           Exercise Goal Re-Evaluation   Exercise Goals Review  Increase  Physical Activity;Able to understand and use rate of perceived exertion (RPE) scale;Knowledge and understanding of Target Heart Rate Range (THRR);Understanding of Exercise Prescription;Able to understand and use Dyspnea scale  Increase Physical Activity;Increase Strength and Stamina;Able to understand and use rate of perceived exertion (RPE) scale;Able to understand and use Dyspnea scale;Knowledge and understanding of Target Heart Rate Range (THRR);Able to check pulse independently;Understanding of Exercise Prescription      Comments  Reviewed RPE scale, THR and program prescription with pt today.  Pt voiced understanding and was given a copy of goals to take home.  Reviewed home exercise with pt today.  Pt plans to use TM at home for exercise.  Reviewed THR, pulse, RPE, sign and symptoms, NTG use, and when to call 911 or MD.  Also discussed weather considerations and indoor options.  Pt voiced understanding.      Expected Outcomes  Short: Use RPE daily to regulate intensity. Long: Follow program prescription in THR.  Short - unpack TM at home to get ready to use Long - exercise at home on days not at [facility]         Discharge Exercise Prescription (Final Exercise Prescription Changes): Exercise Prescription Changes - 03/28/19 0700      Home Exercise Plan   Plans to continue exercise at  Home (comment)    Frequency  Add 1 additional day to program exercise sessions.    Initial Home Exercises Provided  03/28/19       Nutrition:  Target Goals: Understanding of nutrition guidelines, daily intake of sodium <1579m, cholesterol <209m calories 30% from fat and 7% or less from saturated fats, daily to have 5 or more servings of fruits and vegetables.  Biometrics: Pre Biometrics - 03/20/19 1225      Pre Biometrics   Height  5' 10.6" (1.793 m)    Weight  278 lb 9.6 oz (126.4  kg)    BMI (Calculated)  39.31    Single Leg Stand  1.31 seconds        Nutrition Therapy Plan and Nutrition  Goals: Nutrition Therapy & Goals - 04/03/19 0914      Nutrition Therapy   Diet  Renal, HH, DM, Low Na diet    Drug/Food Interactions  Purine/Gout    Protein (specify units)  100g    Fiber  30 grams    Whole Grain Foods  3 servings    Saturated Fats  12 max. grams    Fruits and Vegetables  5 servings/day    Sodium  1.5 grams      Personal Nutrition Goals   Nutrition Goal  ST: continue with current changes LT: Lose weight    Comments  Not on dialysis. Renal doctor 2-3 months (blood work 1x/month): pt reports improved kidney function at 13%; pt reports that renal MD stated that if he says at 13% he does not need dialysis.  A1c 6.1%. 4-6 oz of coffee. Lunch 12 oz of water. Afternoon 16 oz. Dinner 98XQ. B: 2 packets sugar free oatmeal w/ cup of fruit. L: sometimes: sandwich (chicken salad) with cup of fruit. D: meat w/ two vegetables (chicken baked in oven) variety of vegetables. Uses airfyer. Discussed DM, renal, HH eating. Discussed set point weight range and health outcomes. Pt would not like to make any goals at this time. Pt reports edema with some pitting, not on fluid pill.      Intervention Plan   Intervention  Prescribe, educate and counsel regarding individualized specific dietary modifications aiming towards targeted core components such as weight, hypertension, lipid management, diabetes, heart failure and other comorbidities.;Nutrition handout(s) given to patient.    Expected Outcomes  Short Term Goal: Understand basic principles of dietary content, such as calories, fat, sodium, cholesterol and nutrients.;Short Term Goal: A plan has been developed with personal nutrition goals set during dietitian appointment.;Long Term Goal: Adherence to prescribed nutrition plan.       Nutrition Assessments: Nutrition Assessments - 03/20/19 1226      MEDFICTS Scores   Pre Score  60       Nutrition Goals Re-Evaluation:   Nutrition Goals Discharge (Final Nutrition Goals  Re-Evaluation):   Psychosocial: Target Goals: Acknowledge presence or absence of significant depression and/or stress, maximize coping skills, provide positive support system. Participant is able to verbalize types and ability to use techniques and skills needed for reducing stress and depression.   Initial Review & Psychosocial Screening: Initial Psych Review & Screening - 03/19/19 1204      Initial Review   Current issues with  None Identified      Family Dynamics   Good Support System?  Yes    Comments  He can look to his wife, two sons and grand children for supprort. He states he has a positive outlook on life in genral.      Barriers   Psychosocial barriers to participate in program  The patient should benefit from training in stress management and relaxation.      Screening Interventions   Interventions  To provide support and resources with identified psychosocial needs;Provide feedback about the scores to participant;Encouraged to exercise    Expected Outcomes  Short Term goal: Identification and review with participant of any Quality of Life or Depression concerns found by scoring the questionnaire.;Short Term goal: Utilizing psychosocial counselor, staff and physician to assist with identification of specific Stressors or current issues interfering with  healing process. Setting desired goal for each stressor or current issue identified.;Long Term Goal: Stressors or current issues are controlled or eliminated.;Long Term goal: The participant improves quality of Life and PHQ9 Scores as seen by post scores and/or verbalization of changes       Quality of Life Scores:  Scores of 19 and below usually indicate a poorer quality of life in these areas.  A difference of  2-3 points is a clinically meaningful difference.  A difference of 2-3 points in the total score of the Quality of Life Index has been associated with significant improvement in overall quality of life, self-image,  physical symptoms, and general health in studies assessing change in quality of life.  PHQ-9: Recent Review Flowsheet Data    Depression screen [facility] 2/9 03/20/2019 11/18/2017 12/08/2016 02/16/2016 02/12/2015   Decreased Interest 3 1 0 1 2   Down, Depressed, Hopeless 0 1 0 1 2   PHQ - 2 Score 3 2 0 2 4   Altered sleeping 0 0 0 2 2   Tired, decreased energy '3 3 1 3 3   ' Change in appetite 0 0 1 2 0   Feeling bad or failure about yourself  0 0 0 1 0   Trouble concentrating 0 0 0 1 0   Moving slowly or fidgety/restless 0 0 0 1 1   Suicidal thoughts 0 0 0 0 1   PHQ-9 Score '6 5 2 12 11   ' Difficult doing work/chores Somewhat difficult Not difficult at all Somewhat difficult Somewhat difficult Somewhat difficult     Interpretation of Total Score  Total Score Depression Severity:  1-4 = Minimal depression, 5-9 = Mild depression, 10-14 = Moderate depression, 15-19 = Moderately severe depression, 20-27 = Severe depression   Psychosocial Evaluation and Intervention:   Psychosocial Re-Evaluation:   Psychosocial Discharge (Final Psychosocial Re-Evaluation):   Education: Education Goals: Education classes will be provided on a weekly basis, covering required topics. Participant will state understanding/return demonstration of topics presented.  Learning Barriers/Preferences: Learning Barriers/Preferences - 03/19/19 1206      Learning Barriers/Preferences   Learning Barriers  None    Learning Preferences  None       Education Topics:  Initial Evaluation Education: - Verbal, written and demonstration of respiratory meds, oximetry and breathing techniques. Instruction on use of nebulizers and MDIs and importance of monitoring MDI activations.   Pulmonary Rehab from 03/21/2019 in [facility] Cardiac and Pulmonary Rehab  Date  03/19/19  Educator  [facility]  Instruction Review Code  1- Verbalizes Understanding      General Nutrition Guidelines/Fats and Fiber: -Group instruction provided by verbal,  written material, models and posters to present the general guidelines for heart healthy nutrition. Gives an explanation and review of dietary fats and fiber.   Pulmonary Rehab from 03/21/2019 in [facility] Cardiac and Pulmonary Rehab  Date  03/21/19  Educator  [facility]  Instruction Review Code  1- Verbalizes Understanding      Controlling Sodium/Reading Food Labels: -Group verbal and written material supporting the discussion of sodium use in heart healthy nutrition. Review and explanation with models, verbal and written materials for utilization of the food label.   Exercise Physiology & General Exercise Guidelines: - Group verbal and written instruction with models to review the exercise physiology of the cardiovascular system and associated critical values. Provides general exercise guidelines with specific guidelines to those with heart or lung disease.    Aerobic Exercise & Resistance Training: - Gives group verbal  and written instruction on the various components of exercise. Focuses on aerobic and resistive training programs and the benefits of this training and how to safely progress through these programs.   Flexibility, Balance, Mind/Body Relaxation: Provides group verbal/written instruction on the benefits of flexibility and balance training, including mind/body exercise modes such as yoga, pilates and tai chi.  Demonstration and skill practice provided.   Pulmonary Rehab from 03/21/2019 in [facility] Cardiac and Pulmonary Rehab  Date  03/21/19  Educator  AS  Instruction Review Code  1- Verbalizes Understanding      Stress and Anxiety: - Provides group verbal and written instruction about the health risks of elevated stress and causes of high stress.  Discuss the correlation between heart/lung disease and anxiety and treatment options. Review healthy ways to manage with stress and anxiety.   Depression: - Provides group verbal and written instruction on the correlation between  heart/lung disease and depressed mood, treatment options, and the stigmas associated with seeking treatment.   Exercise & Equipment Safety: - Individual verbal instruction and demonstration of equipment use and safety with use of the equipment.   Pulmonary Rehab from 03/21/2019 in [facility] Cardiac and Pulmonary Rehab  Date  03/20/19  Educator  [facility]  Instruction Review Code  1- Verbalizes Understanding      Infection Prevention: - Provides verbal and written material to individual with discussion of infection control including proper hand washing and proper equipment cleaning during exercise session.   Pulmonary Rehab from 03/21/2019 in [facility] Cardiac and Pulmonary Rehab  Date  03/19/19  Educator  [facility]  Instruction Review Code  1- Verbalizes Understanding      Falls Prevention: - Provides verbal and written material to individual with discussion of falls prevention and safety.   Pulmonary Rehab from 03/21/2019 in [facility] Cardiac and Pulmonary Rehab  Date  03/19/19  Educator  [facility]  Instruction Review Code  1- Verbalizes Understanding      Diabetes: - Individual verbal and written instruction to review signs/symptoms of diabetes, desired ranges of glucose level fasting, after meals and with exercise. Advice that pre and post exercise glucose checks will be done for 3 sessions at entry of program.   Pulmonary Rehab from 03/21/2019 in [facility] Cardiac and Pulmonary Rehab  Date  03/19/19  Educator  [facility]  Instruction Review Code  1- Verbalizes Understanding      Chronic Lung Diseases: - Group verbal and written instruction to review updates, respiratory medications, advancements in procedures and treatments. Discuss use of supplemental oxygen including available portable oxygen systems, continuous and intermittent flow rates, concentrators, personal use and safety guidelines. Review proper use of inhaler and spacers. Provide informative websites for self-education.    Energy Conservation: - Provide  group verbal and written instruction for methods to conserve energy, plan and organize activities. Instruct on pacing techniques, use of adaptive equipment and posture/positioning to relieve shortness of breath.   Triggers and Exacerbations: - Group verbal and written instruction to review types of environmental triggers and ways to prevent exacerbations. Discuss weather changes, air quality and the benefits of nasal washing. Review warning signs and symptoms to help prevent infections. Discuss techniques for effective airway clearance, coughing, and vibrations.   AED/CPR: - Group verbal and written instruction with the use of models to demonstrate the basic use of the AED with the basic ABC's of resuscitation.   Anatomy and Physiology of the Lungs: - Group verbal and written instruction with the use of models to provide basic lung anatomy  and physiology related to function, structure and complications of lung disease.   Anatomy & Physiology of the Heart: - Group verbal and written instruction and models provide basic cardiac anatomy and physiology, with the coronary electrical and arterial systems. Review of Valvular disease and Heart Failure   Cardiac Medications: - Group verbal and written instruction to review commonly prescribed medications for heart disease. Reviews the medication, class of the drug, and side effects.   Know Your Numbers and Risk Factors: -Group verbal and written instruction about important numbers in your health.  Discussion of what are risk factors and how they play a role in the disease process.  Review of Cholesterol, Blood Pressure, Diabetes, and BMI and the role they play in your overall health.   Sleep Hygiene: -Provides group verbal and written instruction about how sleep can affect your health.  Define sleep hygiene, discuss sleep cycles and impact of sleep habits. Review good sleep hygiene tips.    Other: -Provides group and verbal instruction on  various topics (see comments)    Knowledge Questionnaire Score: Knowledge Questionnaire Score - 03/20/19 1226      Knowledge Questionnaire Score   Pre Score  15/16 Education Focus: Oxygen safety        Core Components/Risk Factors/Patient Goals at Admission: Personal Goals and Risk Factors at Admission - 03/20/19 1227      Core Components/Risk Factors/Patient Goals on Admission    Weight Management  Yes;Weight Loss;Obesity    Intervention  Weight Management: Develop a combined nutrition and exercise program designed to reach desired caloric intake, while maintaining appropriate intake of nutrient and fiber, sodium and fats, and appropriate energy expenditure required for the weight goal.;Weight Management: Provide education and appropriate resources to help participant work on and attain dietary goals.;Weight Management/Obesity: Establish reasonable short term and long term weight goals.;Obesity: Provide education and appropriate resources to help participant work on and attain dietary goals.    Admit Weight  278 lb 9.6 oz (126.4 kg)    Goal Weight: Short Term  273 lb (123.8 kg)    Goal Weight: Long Term  268 lb (121.6 kg)    Expected Outcomes  Short Term: Continue to assess and modify interventions until short term weight is achieved;Long Term: Adherence to nutrition and physical activity/exercise program aimed toward attainment of established weight goal;Weight Loss: Understanding of general recommendations for a balanced deficit meal plan, which promotes 1-2 lb weight loss per week and includes a negative energy balance of [phone] kcal/d;Understanding recommendations for meals to include 15-35% energy as protein, 25-35% energy from fat, 35-60% energy from carbohydrates, less than 263m of dietary cholesterol, 20-35 gm of total fiber daily;Understanding of distribution of calorie intake throughout the day with the consumption of 4-5 meals/snacks    Improve shortness of breath with ADL's  Yes     Intervention  Provide education, individualized exercise plan and daily activity instruction to help decrease symptoms of SOB with activities of daily living.    Expected Outcomes  Short Term: Improve cardiorespiratory fitness to achieve a reduction of symptoms when performing ADLs;Long Term: Be able to perform more ADLs without symptoms or delay the onset of symptoms    Diabetes  Yes    Intervention  Provide education about signs/symptoms and action to take for hypo/hyperglycemia.;Provide education about proper nutrition, including hydration, and aerobic/resistive exercise prescription along with prescribed medications to achieve blood glucose in normal ranges: Fasting glucose 65-99 mg/dL    Expected Outcomes  Short Term: Participant verbalizes understanding  of the signs/symptoms and immediate care of hyper/hypoglycemia, proper foot care and importance of medication, aerobic/resistive exercise and nutrition plan for blood glucose control.;Long Term: Attainment of HbA1C < 7%.    Heart Failure  Yes    Intervention  Provide a combined exercise and nutrition program that is supplemented with education, support and counseling about heart failure. Directed toward relieving symptoms such as shortness of breath, decreased exercise tolerance, and extremity edema.    Expected Outcomes  Improve functional capacity of life;Short term: Attendance in program 2-3 days a week with increased exercise capacity. Reported lower sodium intake. Reported increased fruit and vegetable intake. Reports medication compliance.;Short term: Daily weights obtained and reported for increase. Utilizing diuretic protocols set by physician.;Long term: Adoption of self-care skills and reduction of barriers for early signs and symptoms recognition and intervention leading to self-care maintenance.    Hypertension  Yes    Intervention  Provide education on lifestyle modifcations including regular physical activity/exercise, weight  management, moderate sodium restriction and increased consumption of fresh fruit, vegetables, and low fat dairy, alcohol moderation, and smoking cessation.;Monitor prescription use compliance.    Expected Outcomes  Short Term: Continued assessment and intervention until BP is < 140/12m HG in hypertensive participants. < 130/848mHG in hypertensive participants with diabetes, heart failure or chronic kidney disease.;Long Term: Maintenance of blood pressure at goal levels.    Lipids  Yes    Intervention  Provide education and support for participant on nutrition & aerobic/resistive exercise along with prescribed medications to achieve LDL <7089mHDL >95m49m  Expected Outcomes  Short Term: Participant states understanding of desired cholesterol values and is compliant with medications prescribed. Participant is following exercise prescription and nutrition guidelines.;Long Term: Cholesterol controlled with medications as prescribed, with individualized exercise RX and with personalized nutrition plan. Value goals: LDL < 70mg26mL > 40 mg.       Core Components/Risk Factors/Patient Goals Review:    Core Components/Risk Factors/Patient Goals at Discharge (Final Review):    ITP Comments: ITP Comments    Row Name 03/19/19 1213 03/20/19 1158 03/26/19 0741 03/28/19 0735 04/03/19 0939   ITP Comments  Virtual Orientation performed. Patient informed when to come in for RD and EP orientation. Diagnosis can be found in [facility]/2020.  Completed 6MWT and gym orientation.  Initial ITP created and sent for review to Dr. Mark Emily Filbertical Director.  Weight up 2 pounds. Fluid pill stopped last week.  Has appt with MD on Wed this week.  He will call MD if weight continues to increase.  Weight up. Has appt with Kidney MD today and plans to talk about weight gain. Had called office and no response as of this AM  Completed initial RD eval   Row Name 04/04/19 1027           ITP Comments  30 day review competed .  ITP sent to Dr Mark Emily Fil[place], changes as needed and ITP approval signature.          Comments:

## 2019-04-04 NOTE — Telephone Encounter (Signed)
Noted  

## 2019-04-09 ENCOUNTER — Other Ambulatory Visit: Payer: Self-pay

## 2019-04-09 ENCOUNTER — Encounter: Payer: Commercial Managed Care - PPO | Admitting: *Deleted

## 2019-04-09 DIAGNOSIS — Z87891 Personal history of nicotine dependence: Secondary | ICD-10-CM | POA: Diagnosis not present

## 2019-04-09 DIAGNOSIS — K219 Gastro-esophageal reflux disease without esophagitis: Secondary | ICD-10-CM | POA: Diagnosis not present

## 2019-04-09 DIAGNOSIS — Z79899 Other long term (current) drug therapy: Secondary | ICD-10-CM | POA: Diagnosis not present

## 2019-04-09 DIAGNOSIS — E785 Hyperlipidemia, unspecified: Secondary | ICD-10-CM | POA: Diagnosis not present

## 2019-04-09 DIAGNOSIS — Z794 Long term (current) use of insulin: Secondary | ICD-10-CM | POA: Diagnosis not present

## 2019-04-09 DIAGNOSIS — G4733 Obstructive sleep apnea (adult) (pediatric): Secondary | ICD-10-CM

## 2019-04-09 DIAGNOSIS — M109 Gout, unspecified: Secondary | ICD-10-CM | POA: Diagnosis not present

## 2019-04-09 DIAGNOSIS — I130 Hypertensive heart and chronic kidney disease with heart failure and stage 1 through stage 4 chronic kidney disease, or unspecified chronic kidney disease: Secondary | ICD-10-CM | POA: Diagnosis not present

## 2019-04-09 DIAGNOSIS — Z79890 Hormone replacement therapy (postmenopausal): Secondary | ICD-10-CM | POA: Diagnosis not present

## 2019-04-09 DIAGNOSIS — F329 Major depressive disorder, single episode, unspecified: Secondary | ICD-10-CM | POA: Diagnosis not present

## 2019-04-09 DIAGNOSIS — E039 Hypothyroidism, unspecified: Secondary | ICD-10-CM | POA: Diagnosis not present

## 2019-04-09 DIAGNOSIS — I509 Heart failure, unspecified: Secondary | ICD-10-CM | POA: Diagnosis not present

## 2019-04-09 DIAGNOSIS — N189 Chronic kidney disease, unspecified: Secondary | ICD-10-CM | POA: Diagnosis not present

## 2019-04-09 DIAGNOSIS — Z7901 Long term (current) use of anticoagulants: Secondary | ICD-10-CM | POA: Diagnosis not present

## 2019-04-09 DIAGNOSIS — E1122 Type 2 diabetes mellitus with diabetic chronic kidney disease: Secondary | ICD-10-CM | POA: Diagnosis not present

## 2019-04-09 NOTE — Progress Notes (Signed)
Daily Session Note  Patient Details  Name: Patrick Hurst MRN: [id number] Date of Birth: November 26, 1955 Referring Provider:     Pulmonary Rehab from 03/20/2019 in [facility] Cardiac and Pulmonary Rehab  Referring Provider  Lujean Amel MD      Encounter Date: 04/09/2019  Check In: Session Check In - 04/09/19 0823      Check-In   Supervising physician immediately available to respond to emergencies  See telemetry face sheet for immediately available ER MD    Location  [facility]-Cardiac & Pulmonary Rehab    Staff Present  Heath Lark, RN, BSN, Laveda Norman, BS, ACSM CEP, Exercise Physiologist    Virtual Visit  No    Medication changes reported      No    Fall or balance concerns reported     No    Warm-up and Cool-down  Performed on first and last piece of equipment    Resistance Training Performed  Yes    VAD Patient?  No    PAD/SET Patient?  No      Pain Assessment   Currently in Pain?  No/denies          Social History   Tobacco Use  Smoking Status Former Smoker  . Packs/day: 1.00  . Years: 15.00  . Pack years: 15.00  . Types: Cigarettes  . Quit date: 05/03/1978  . Years since quitting: 40.9  Smokeless Tobacco Former Systems developer  Tobacco Comment   started smoking at age 58    Goals Met:  Independence with exercise equipment Exercise tolerated well No report of cardiac concerns or symptoms  Goals Unmet:  Not Applicable  Comments: Pt able to follow exercise prescription today without complaint.  Will continue to monitor for progression.    Dr. Emily Filbert is Medical Director for [facility] [facility] and [facility].

## 2019-04-11 ENCOUNTER — Encounter: Payer: Commercial Managed Care - PPO | Admitting: *Deleted

## 2019-04-11 ENCOUNTER — Other Ambulatory Visit: Payer: Self-pay

## 2019-04-11 DIAGNOSIS — G4733 Obstructive sleep apnea (adult) (pediatric): Secondary | ICD-10-CM | POA: Diagnosis not present

## 2019-04-11 NOTE — Progress Notes (Signed)
Daily Session Note  Patient Details  Name: Kolson L Bier MRN: [id number] Date of Birth: 08/06/1955 Referring Provider:     Pulmonary Rehab from 03/20/2019 in [facility] Cardiac and Pulmonary Rehab  Referring Provider  Callwood, Dwayne MD      Encounter Date: 04/11/2019  Check In: Session Check In - 04/11/19 0826      Check-In   Supervising physician immediately available to respond to emergencies  See telemetry face sheet for immediately available ER MD    Location  [facility]-Cardiac & Pulmonary Rehab    Staff Present   , RN, BSN, CCRP;Joseph Hood RCP,RRT,BSRT    Virtual Visit  No    Medication changes reported      No    Fall or balance concerns reported     No    Warm-up and Cool-down  Performed on first and last piece of equipment    Resistance Training Performed  Yes    VAD Patient?  No    PAD/SET Patient?  No      Pain Assessment   Currently in Pain?  No/denies          Social History   Tobacco Use  Smoking Status Former Smoker  . Packs/day: 1.00  . Years: 15.00  . Pack years: 15.00  . Types: Cigarettes  . Quit date: 05/03/1978  . Years since quitting: 40.9  Smokeless Tobacco Former User  Tobacco Comment   started smoking at age 63    Goals Met:  Proper associated with RPD/PD & O2 Sat Independence with exercise equipment Exercise tolerated well Personal goals reviewed No report of cardiac concerns or symptoms  Goals Unmet:  Not Applicable  Comments: Pt able to follow exercise prescription today without complaint.  Will continue to monitor for progression.    Dr. Mark Miller is Medical Director for [facility] and [facility]. 

## 2019-04-12 ENCOUNTER — Telehealth: Payer: Self-pay | Admitting: Pulmonary Disease

## 2019-04-12 NOTE — Telephone Encounter (Signed)
LMTCB for the pt to inquire where he had sleep study done

## 2019-04-13 NOTE — Telephone Encounter (Signed)
Reviewed patient's chart, it looks like a possible sleep study was done on 05/11/13 at [place] [facility] at [phone] but no one answered. Will attempt to call back.

## 2019-04-17 NOTE — Telephone Encounter (Signed)
LMTCB for [facility] with [facility].

## 2019-04-18 ENCOUNTER — Other Ambulatory Visit: Payer: Self-pay | Admitting: Family Medicine

## 2019-04-18 ENCOUNTER — Encounter: Payer: Commercial Managed Care - PPO | Admitting: *Deleted

## 2019-04-18 ENCOUNTER — Other Ambulatory Visit: Payer: Self-pay

## 2019-04-18 DIAGNOSIS — G4733 Obstructive sleep apnea (adult) (pediatric): Secondary | ICD-10-CM | POA: Diagnosis not present

## 2019-04-18 NOTE — Telephone Encounter (Signed)
Received fax. Called and spoke to [place] at [place]. She wanted prior to sleep study office notes which we don't have with the sleep study. She asked for OV notes and sleep study to be faxed to her. Faxed over the [place] notes we have and the sleep study. Will leave encounter open for confirmation of receipt and to follow up on if anything else further is needed.

## 2019-04-18 NOTE — Progress Notes (Signed)
Daily Session Note  Patient Details  Name: Patrick Hurst MRN: [id number] Date of Birth: 02/14/1956 Referring Provider:     Pulmonary Rehab from 03/20/2019 in [facility] Cardiac and Pulmonary Rehab  Referring Provider  Lujean Amel MD      Encounter Date: 04/18/2019  Check In: Session Check In - 04/18/19 0816      Check-In   Supervising physician immediately available to respond to emergencies  See telemetry face sheet for immediately available ER MD    Location  [facility]-Cardiac & Pulmonary Rehab    Staff Present  Heath Lark, RN, BSN, CCRP;Jeanna Durrell BS, Exercise Physiologist;Joseph Hood RCP,RRT,BSRT    Virtual Visit  No    Medication changes reported      No    Fall or balance concerns reported     No    Warm-up and Cool-down  Performed on first and last piece of equipment    Resistance Training Performed  Yes    VAD Patient?  No    PAD/SET Patient?  No      Pain Assessment   Currently in Pain?  No/denies          Social History   Tobacco Use  Smoking Status Former Smoker  . Packs/day: 1.00  . Years: 15.00  . Pack years: 15.00  . Types: Cigarettes  . Quit date: 05/03/1978  . Years since quitting: 40.9  Smokeless Tobacco Former Systems developer  Tobacco Comment   started smoking at age 43    Goals Met:  Proper associated with RPD/PD & O2 Sat Independence with exercise equipment Exercise tolerated well No report of cardiac concerns or symptoms  Goals Unmet:  Not Applicable  Comments: Pt able to follow exercise prescription today without complaint.  Will continue to monitor for progression.    Dr. Emily Filbert is Medical Director for [facility] [place] and [facility].

## 2019-04-18 NOTE — Telephone Encounter (Signed)
Called and spoke to [facility].  They have his sleep study and will fax this over. Will await fax.

## 2019-04-19 NOTE — Progress Notes (Signed)
Patient ID: Patrick Hurst, male    DOB: 01-22-1956, 63 y.o.   MRN: [id number]  HPI  Patrick Hurst is a 63 y/o male with a history of DM, hyperlipidemia, HTN, CKD, thyroid disease, GERD, gout, anemia, depression, obstructive sleep apnea, bell's palsy, remote tobacco use and chronic heart failure.   Echo report from 03/06/2019 reviewed and showed an EF of 55-60% along with minimal Patrick and trivial TR. Echo report from 06/23/2018 reviewed and showed an EF of 50-55%.  Admitted 03/01/2019 due to acute on chronic HF and new onset atrial fibrillation with RVR. Cardiology consult obtained. Initially given IV lasix with net loss of 13L and then transitioned to oral medications. Was given heparin to start with transition to eliquis. Troponin elevated due to demand ischemia. Discharged after 8 days.   He presents today for a follow-up visit with a chief complaint of moderate fatigue upon minimal exertion. He describes this as chronic in nature having been present for several years. He has associated cough, shortness of breath, worsening pedal edema and gradual weight gain along with this. He denies any difficulty sleeping, dizziness, abdominal distention, palpitations, chest pain or wheezing.   Has been taking off his diuretic due to worsening renal function and is waiting on lab results from 12/16 before taking his torsemide. Current nephrology note says to take torsemide as needed but patient was waiting on most recent lab results before taking it.     Past Medical History:  Diagnosis Date  . Allergy   . Anemia   . Arthritis   . Bell's palsy   . CHF (congestive heart failure) ([place])   . CKD (chronic kidney disease)   . Depression   . Diabetes (Patrick Hurst)   . Gastric ulcer   . GERD (gastroesophageal reflux disease)   . Gout   . History of chicken pox   . Hyperlipidemia   . Hypertension   . Hypothyroidism   . OSA (obstructive sleep apnea)   . Thyroid disease   . Vitamin D deficiency    Past Surgical  History:  Procedure Laterality Date  . CATARACT EXTRACTION Bilateral 2014 and 2015  . CHOLECYSTECTOMY  04/28/2011   Laproscopic; Dr. Pat Patrick  . GALLBLADDER SURGERY    . LASIK Bilateral 2019   medical  . SHOULDER SURGERY Right 2012   Dr. Leanor Kail   Family History  Problem Relation Age of Onset  . COPD Mother   . Heart disease Father    Social History   Tobacco Use  . Smoking status: Former Smoker    Packs/day: 1.00    Years: 15.00    Pack years: 15.00    Types: Cigarettes    Quit date: 05/03/1978    Years since quitting: 40.9  . Smokeless tobacco: Former Systems developer  . Tobacco comment: started smoking at age 53  Substance Use Topics  . Alcohol use: No    Alcohol/week: 0.0 standard drinks   Allergies  Allergen Reactions  . Guaifenesin Swelling    Throat swelling, increased heart rate.  . Mucinex  [Guaifenesin Er]     Throat swelling, increases heart rate  . Levaquin [Levofloxacin] Palpitations   Prior to Admission medications   Medication Sig Start Date End Date Taking? Authorizing Provider  albuterol (VENTOLIN HFA) 108 (90 Base) MCG/ACT inhaler USE 2 INHALATIONS EVERY 4 HOURS AS NEEDED FOR WHEEZING OR SHORTNESS OF BREATH 09/28/18  Yes [name], MD  amiodarone (PACERONE) 200 MG tablet Take 400 mg (two tabs) twice  daily for 5 more days, then take 200 mg (1 tab) daily 03/09/19  Yes Danford, Suann Larry, MD  amitriptyline (ELAVIL) 25 MG tablet Take 1 tablet (25 mg total) by mouth at bedtime. 08/18/18  Yes [name], MD  amLODipine (NORVASC) 5 MG tablet Take 1 tablet (5 mg total) by mouth daily. 03/10/19  Yes Danford, Suann Larry, MD  Ascorbic Acid (VITAMIN C) 1000 MG tablet Take 1,000 mg by mouth 2 (two) times daily.    Yes [provider]  atorvastatin (LIPITOR) 40 MG tablet TAKE 1 TABLET DAILY 04/18/19  Yes [name], MD  calcitRIOL (ROCALTROL) 0.5 MCG capsule Take 0.5 mcg by mouth daily. 12/29/18  Yes [provider]  cetirizine (ZYRTEC)  10 MG tablet Take 10 mg by mouth daily.    Yes [provider]  doxycycline (VIBRA-TABS) 100 MG tablet Take 1 tablet (100 mg total) by mouth 2 (two) times daily. 04/03/19  Yes Parrett, Tammy S, NP  ELIQUIS 2.5 MG TABS tablet SMARTSIG:1 Tablet(s) By Mouth Every 12 Hours 03/23/19  Yes [provider]  fluticasone furoate-vilanterol (BREO ELLIPTA) 100-25 MCG/INH AEPB Inhale 1 puff into the lungs daily. 12/14/18  Yes Wilhelmina Mcardle, MD  hydrALAZINE (APRESOLINE) 25 MG tablet TAKE 1 TABLET THREE TIMES A DAY 01/01/19  Yes [name], MD  insulin regular human CONCENTRATED (HUMULIN R) 500 UNIT/ML injection Inject up to 25 units twice a day, or as directed by physician 01/05/19  Yes [name], MD  ipratropium-albuterol (DUONEB) 0.5-2.5 (3) MG/3ML SOLN INHALE 3 ML BY NEBULIZATION EVERY 4 HOURS AS NEEDED 11/30/18  Yes Wilhelmina Mcardle, MD  levothyroxine (SYNTHROID) 100 MCG tablet Take 1 tablet (100 mcg total) by mouth daily. 08/23/18  Yes [name], MD  metoprolol tartrate (LOPRESSOR) 50 MG tablet Take 0.5 tablets (25 mg total) by mouth 2 (two) times daily. 03/23/19  Yes [name], MD  Misc. Devices (PULSE OXIMETER FOR FINGER) MISC 1 Device by Does not apply route daily as needed. 07/10/18  Yes [name], MD  pantoprazole (PROTONIX) 40 MG tablet TAKE 1 TABLET TWICE A DAY 02/10/19  Yes [name], MD  testosterone cypionate (DEPO-TESTOSTERONE) 200 MG/ML injection Inject 1 mL (200 mg total) into the muscle every 14 (fourteen) days. 03/23/19  Yes [name], MD  ULORIC 80 MG TABS Take 80 mg by mouth daily.  05/18/17  Yes [provider]  vitamin B-12 (CYANOCOBALAMIN) 1000 MCG tablet Take 1,000 mcg by mouth daily.   Yes [provider]     Review of Systems  Constitutional: Positive for fatigue (with minimal exertion). Negative for appetite change.  HENT: Positive for rhinorrhea. Negative for congestion and sore throat.   Eyes: Negative.    Respiratory: Positive for cough (productive) and shortness of breath. Negative for wheezing.   Cardiovascular: Positive for leg swelling. Negative for chest pain and palpitations.  Gastrointestinal: Negative for abdominal distention and abdominal pain.  Endocrine: Negative.   Genitourinary: Negative.   Musculoskeletal: Negative for back pain and neck pain.  Skin: Negative.   Allergic/Immunologic: Negative.   Neurological: Negative for dizziness and light-headedness.  Hematological: Negative for adenopathy. Does not bruise/bleed easily.  Psychiatric/Behavioral: Negative for dysphoric mood and sleep disturbance (wearing bipap at night; oxygen @2L ; sleeping on 1 pillow and wedge). The patient is not nervous/anxious.    Vitals:   04/20/19 0852  BP: (!) 157/77  Pulse: (!) 108  Resp: 20  SpO2: 96%  Weight: 291 lb 3.2 oz (132.1 kg)  Height: 5\' 11"  (1.803 m)   Wt Readings from Last 3 Encounters:  04/20/19 291 lb 3.2 oz (132.1 kg)  04/02/19 278 lb (126.1 kg)  03/23/19 283 lb 6.4 oz (128.5 kg)   Lab Results  Component Value Date   CREATININE 4.57 (H) 03/09/2019   CREATININE 4.21 (H) 03/08/2019   CREATININE 4.11 (H) 03/07/2019    Physical Exam Vitals and nursing note reviewed.  Constitutional:      Appearance: He is well-developed.  HENT:     Head: Normocephalic and atraumatic.  Neck:     Vascular: No JVD.  Cardiovascular:     Rate and Rhythm: Normal rate and regular rhythm.  Pulmonary:     Effort: Pulmonary effort is normal. No respiratory distress.     Breath sounds: No wheezing or rales.  Abdominal:     Palpations: Abdomen is soft.     Tenderness: There is no abdominal tenderness.  Musculoskeletal:     Cervical back: Normal range of motion and neck supple.     Right lower leg: No tenderness. Edema (1+ pitting) present.     Left lower leg: No tenderness. Edema (1+ pitting) present.  Skin:    General: Skin is warm and dry.  Neurological:     General: No focal deficit  present.     Mental Status: He is alert and oriented to person, place, and time.  Psychiatric:        Mood and Affect: Mood normal.        Behavior: Behavior normal.    Assessment & Plan:  1: Chronic heart failure with preserved ejection fraction- - NYHA class III - euvolemic today although edema is worsening - weighing daily and he was reminded to call for an overnight weight gain of >2 pounds or a weekly weight gain of >5 pounds - weight down 15 pounds from last visit here 4 months ago; although upon review of pulmonary rehab logs, he's gained 9 pounds in the last month - not adding salt and has been reading food labels as well as rinsing canned foods  - wearing support socks daily with removal at bedtime - saw cardiology Clayborn Bigness) 03/19/2019 - BNP 03/04/2019 was 315.0 - reports receiving his flu vaccine for this season  2: HTN- - BP mildly elevated today  - saw PCP Caryn Section) 03/23/2019 - BMP from 04/18/2019 reviewed and showed sodium 143, potassium 4.8, creatinine 3.7 and GFR 17  3: DM-  - A1c 03/02/2019 was 6.1% - glucose in clinic today was 18 - saw nephrology [name]) 04/18/2019  4: COPD- - saw pulmonologist Halford Chessman) 04/02/2019 - wearing bipap - wears oxygen at 2L QHS and PRN during the day or upon exertion  Medication list was reviewed.  Return in 6 months or sooner for any questions/problems before then.

## 2019-04-20 ENCOUNTER — Encounter: Payer: Commercial Managed Care - PPO | Admitting: *Deleted

## 2019-04-20 ENCOUNTER — Ambulatory Visit: Payer: Commercial Managed Care - PPO | Attending: Family | Admitting: Family

## 2019-04-20 ENCOUNTER — Telehealth: Payer: Self-pay | Admitting: Pulmonary Disease

## 2019-04-20 ENCOUNTER — Encounter: Payer: Self-pay | Admitting: Family

## 2019-04-20 ENCOUNTER — Other Ambulatory Visit: Payer: Self-pay

## 2019-04-20 VITALS — BP 157/77 | HR 108 | Resp 20 | Ht 71.0 in | Wt 291.2 lb

## 2019-04-20 DIAGNOSIS — N189 Chronic kidney disease, unspecified: Secondary | ICD-10-CM | POA: Diagnosis not present

## 2019-04-20 DIAGNOSIS — Z9841 Cataract extraction status, right eye: Secondary | ICD-10-CM | POA: Insufficient documentation

## 2019-04-20 DIAGNOSIS — I130 Hypertensive heart and chronic kidney disease with heart failure and stage 1 through stage 4 chronic kidney disease, or unspecified chronic kidney disease: Secondary | ICD-10-CM | POA: Insufficient documentation

## 2019-04-20 DIAGNOSIS — Z79899 Other long term (current) drug therapy: Secondary | ICD-10-CM | POA: Diagnosis not present

## 2019-04-20 DIAGNOSIS — F329 Major depressive disorder, single episode, unspecified: Secondary | ICD-10-CM | POA: Insufficient documentation

## 2019-04-20 DIAGNOSIS — Z881 Allergy status to other antibiotic agents status: Secondary | ICD-10-CM | POA: Diagnosis not present

## 2019-04-20 DIAGNOSIS — I5032 Chronic diastolic (congestive) heart failure: Secondary | ICD-10-CM

## 2019-04-20 DIAGNOSIS — Z87891 Personal history of nicotine dependence: Secondary | ICD-10-CM | POA: Insufficient documentation

## 2019-04-20 DIAGNOSIS — Z9049 Acquired absence of other specified parts of digestive tract: Secondary | ICD-10-CM | POA: Insufficient documentation

## 2019-04-20 DIAGNOSIS — E039 Hypothyroidism, unspecified: Secondary | ICD-10-CM | POA: Diagnosis not present

## 2019-04-20 DIAGNOSIS — Z79890 Hormone replacement therapy (postmenopausal): Secondary | ICD-10-CM | POA: Insufficient documentation

## 2019-04-20 DIAGNOSIS — E1122 Type 2 diabetes mellitus with diabetic chronic kidney disease: Secondary | ICD-10-CM | POA: Insufficient documentation

## 2019-04-20 DIAGNOSIS — Z888 Allergy status to other drugs, medicaments and biological substances status: Secondary | ICD-10-CM | POA: Insufficient documentation

## 2019-04-20 DIAGNOSIS — K219 Gastro-esophageal reflux disease without esophagitis: Secondary | ICD-10-CM | POA: Insufficient documentation

## 2019-04-20 DIAGNOSIS — M109 Gout, unspecified: Secondary | ICD-10-CM | POA: Insufficient documentation

## 2019-04-20 DIAGNOSIS — Z836 Family history of other diseases of the respiratory system: Secondary | ICD-10-CM | POA: Insufficient documentation

## 2019-04-20 DIAGNOSIS — G4733 Obstructive sleep apnea (adult) (pediatric): Secondary | ICD-10-CM

## 2019-04-20 DIAGNOSIS — Z7901 Long term (current) use of anticoagulants: Secondary | ICD-10-CM | POA: Insufficient documentation

## 2019-04-20 DIAGNOSIS — I4891 Unspecified atrial fibrillation: Secondary | ICD-10-CM | POA: Diagnosis not present

## 2019-04-20 DIAGNOSIS — Z794 Long term (current) use of insulin: Secondary | ICD-10-CM | POA: Diagnosis not present

## 2019-04-20 DIAGNOSIS — E785 Hyperlipidemia, unspecified: Secondary | ICD-10-CM | POA: Diagnosis not present

## 2019-04-20 DIAGNOSIS — E1140 Type 2 diabetes mellitus with diabetic neuropathy, unspecified: Secondary | ICD-10-CM

## 2019-04-20 DIAGNOSIS — Z9842 Cataract extraction status, left eye: Secondary | ICD-10-CM | POA: Insufficient documentation

## 2019-04-20 DIAGNOSIS — Z7951 Long term (current) use of inhaled steroids: Secondary | ICD-10-CM | POA: Diagnosis not present

## 2019-04-20 DIAGNOSIS — Z8249 Family history of ischemic heart disease and other diseases of the circulatory system: Secondary | ICD-10-CM | POA: Diagnosis not present

## 2019-04-20 DIAGNOSIS — I10 Essential (primary) hypertension: Secondary | ICD-10-CM

## 2019-04-20 DIAGNOSIS — J449 Chronic obstructive pulmonary disease, unspecified: Secondary | ICD-10-CM | POA: Insufficient documentation

## 2019-04-20 DIAGNOSIS — J42 Unspecified chronic bronchitis: Secondary | ICD-10-CM

## 2019-04-20 NOTE — Patient Instructions (Signed)
Continue weighing daily and call for an overnight weight gain of > 2 pounds or a weekly weight gain of >5 pounds. 

## 2019-04-20 NOTE — Progress Notes (Signed)
Daily Session Note  Patient Details  Name: Patrick Hurst MRN: [id number] Date of Birth: [birth year] Referring Provider:     Pulmonary Rehab from 03/20/2019 in [facility] Cardiac and Pulmonary Rehab  Referring Provider  Patrick Amel MD      Encounter Date: 04/20/2019  Check In: Session Check In - 04/20/19 0911      Check-In   Supervising physician immediately available to respond to emergencies  See telemetry face sheet for immediately available ER MD    Location  [facility]-Cardiac & Pulmonary Rehab    Staff Present  Heath Lark, RN, BSN, CCRP;Joseph Foy [place], [place], ACSM CEP, Exercise Physiologist    Virtual Visit  No    Medication changes reported      No    Fall or balance concerns reported     No    Warm-up and Cool-down  Performed on first and last piece of equipment    Resistance Training Performed  Yes    VAD Patient?  No    PAD/SET Patient?  No      Pain Assessment   Currently in Pain?  No/denies          Social History   Tobacco Use  Smoking Status Former Smoker  . Packs/day: 1.00  . Years: 15.00  . Pack years: 15.00  . Types: Cigarettes  . Quit date: 05/03/1978  . Years since quitting: 40.9  Smokeless Tobacco Former Systems developer  Tobacco Comment   started smoking at age 30    Goals Met:  Independence with exercise equipment Exercise tolerated well No report of cardiac concerns or symptoms  Goals Unmet:  Not Applicable  Comments: Pt able to follow exercise prescription today without complaint.  Will continue to monitor for progression. Noticed weight has been rising since admission. Patrick Hurst has appointment at [facility] today and has been given his exercise Log to share all session weights.    Dr. Emily Filbert is Medical Director for [place] and [facility].

## 2019-04-20 NOTE — Telephone Encounter (Signed)
Spoke with Patrick Hurst at [facility], states that pt needs a new sleep study for new bipap supplies.  Patrick Hurst states that just a baseline sleep study would be sufficient for supplies, but will need a new bipap titration study if pt wants/needs a new machine.  Notes that pt's current machine is over 62 years old.  Dr. Halford Chessman please advise on what sleep study you'd like to order for patient.  Thanks!

## 2019-04-23 ENCOUNTER — Encounter: Payer: Commercial Managed Care - PPO | Admitting: *Deleted

## 2019-04-23 ENCOUNTER — Other Ambulatory Visit: Payer: Self-pay

## 2019-04-23 DIAGNOSIS — G4733 Obstructive sleep apnea (adult) (pediatric): Secondary | ICD-10-CM | POA: Diagnosis not present

## 2019-04-23 NOTE — Telephone Encounter (Signed)
Called the patient and made him aware of the test required. Patient voiced understanding. Test ordered. Nothing further needed at this time.

## 2019-04-23 NOTE — Progress Notes (Signed)
Daily Session Note  Patient Details  Name: ZORAWAR STROLLO MRN: [id number] Date of Birth: June 25, 1955 Referring Provider:     Pulmonary Rehab from 03/20/2019 in [facility] Cardiac and Pulmonary Rehab  Referring Provider  Lujean Amel MD      Encounter Date: 04/23/2019  Check In: Session Check In - 04/23/19 0813      Check-In   Supervising physician immediately available to respond to emergencies  See telemetry face sheet for immediately available ER MD    Location  [facility]-Cardiac & Pulmonary Rehab    Staff Present  Heath Lark, RN, BSN, CCRP;Joseph Hood RCP,RRT,BSRT;Kelly Boaz, [place], ACSM CEP, Exercise Physiologist    Virtual Visit  No    Medication changes reported      No    Comments  Took fluid pill this weekend   weight is down    Fall or balance concerns reported     No    Warm-up and Cool-down  Performed on first and last piece of equipment    Resistance Training Performed  Yes    VAD Patient?  No    PAD/SET Patient?  No      Pain Assessment   Currently in Pain?  Yes          Social History   Tobacco Use  Smoking Status Former Smoker  . Packs/day: 1.00  . Years: 15.00  . Pack years: 15.00  . Types: Cigarettes  . Quit date: 05/03/1978  . Years since quitting: 41.0  Smokeless Tobacco Former Systems developer  Tobacco Comment   started smoking at age 73    Goals Met:  Independence with exercise equipment Exercise tolerated well No report of cardiac concerns or symptoms  Goals Unmet:  Not Applicable  Comments: Pt able to follow exercise prescription today without complaint.  Will continue to monitor for progression.  Started fluid pills again per physician order  Dr. Emily Filbert is Medical Director for [place] and [facility].

## 2019-04-23 NOTE — Telephone Encounter (Signed)
Please let the patient know this is required by insurance.  Please schedule split night sleep study.

## 2019-04-30 ENCOUNTER — Other Ambulatory Visit: Payer: Self-pay

## 2019-04-30 ENCOUNTER — Encounter: Payer: Commercial Managed Care - PPO | Admitting: *Deleted

## 2019-04-30 DIAGNOSIS — G4733 Obstructive sleep apnea (adult) (pediatric): Secondary | ICD-10-CM | POA: Diagnosis not present

## 2019-04-30 NOTE — Progress Notes (Signed)
Daily Session Note  Patient Details  Name: Patrick Hurst MRN: [id number] Date of Birth: 1955/06/04 Referring Provider:     Pulmonary Rehab from 03/20/2019 in [facility] Cardiac and Pulmonary Rehab  Referring Provider  Lujean Amel MD      Encounter Date: 04/30/2019  Check In: Session Check In - 04/30/19 0815      Check-In   Supervising physician immediately available to respond to emergencies  See telemetry face sheet for immediately available ER MD    Location  [facility]-Cardiac & Pulmonary Rehab    Staff Present  Heath Lark, RN, BSN, Lance Sell, BA, ACSM CEP, Exercise Physiologist;Joseph Tessie Fass RCP,RRT,BSRT    Virtual Visit  No    Medication changes reported      No    Fall or balance concerns reported     No    Warm-up and Cool-down  Performed on first and last piece of equipment    Resistance Training Performed  No    VAD Patient?  No    PAD/SET Patient?  No      Pain Assessment   Currently in Pain?  No/denies          Social History   Tobacco Use  Smoking Status Former Smoker  . Packs/day: 1.00  . Years: 15.00  . Pack years: 15.00  . Types: Cigarettes  . Quit date: 05/03/1978  . Years since quitting: 41.0  Smokeless Tobacco Former Systems developer  Tobacco Comment   started smoking at age 71    Goals Met:  Proper associated with RPD/PD & O2 Sat Independence with exercise equipment Exercise tolerated well No report of cardiac concerns or symptoms  Goals Unmet:  Not Applicable  Comments: Pt able to follow exercise prescription today without complaint.  Will continue to monitor for progression.    Dr. Emily Filbert is Medical Director for [place] and [facility].

## 2019-05-02 ENCOUNTER — Encounter: Payer: Self-pay | Admitting: *Deleted

## 2019-05-02 DIAGNOSIS — G4733 Obstructive sleep apnea (adult) (pediatric): Secondary | ICD-10-CM

## 2019-05-02 NOTE — Progress Notes (Signed)
Pulmonary Individual Treatment Plan  Patient Details  Name: Patrick Hurst MRN: [id number] Date of Birth: 1955-11-26 Referring Provider:     Pulmonary Rehab from 03/20/2019 in [facility] Cardiac and Pulmonary Rehab  Referring Provider  Lujean Amel MD      Initial Encounter Date:    Pulmonary Rehab from 03/20/2019 in [facility] Cardiac and Pulmonary Rehab  Date  03/20/19      Visit Diagnosis: OSA (obstructive sleep apnea)  Patient's Home Medications on Admission:  Current Outpatient Medications:  .  albuterol (VENTOLIN HFA) 108 (90 Base) MCG/ACT inhaler, USE 2 INHALATIONS EVERY 4 HOURS AS NEEDED FOR WHEEZING OR SHORTNESS OF BREATH, Disp: 8.5 g, Rfl: 10 .  amiodarone (PACERONE) 200 MG tablet, Take 400 mg (two tabs) twice daily for 5 more days, then take 200 mg (1 tab) daily, Disp: 45 tablet, Rfl: 0 .  amitriptyline (ELAVIL) 25 MG tablet, Take 1 tablet (25 mg total) by mouth at bedtime., Disp: 90 tablet, Rfl: 3 .  amLODipine (NORVASC) 5 MG tablet, Take 1 tablet (5 mg total) by mouth daily., Disp: 30 tablet, Rfl: 3 .  Ascorbic Acid (VITAMIN C) 1000 MG tablet, Take 1,000 mg by mouth 2 (two) times daily. , Disp: , Rfl:  .  atorvastatin (LIPITOR) 40 MG tablet, TAKE 1 TABLET DAILY, Disp: 90 tablet, Rfl: 0 .  calcitRIOL (ROCALTROL) 0.5 MCG capsule, Take 0.5 mcg by mouth daily., Disp: , Rfl:  .  cetirizine (ZYRTEC) 10 MG tablet, Take 10 mg by mouth daily. , Disp: , Rfl:  .  doxycycline (VIBRA-TABS) 100 MG tablet, Take 1 tablet (100 mg total) by mouth 2 (two) times daily., Disp: 14 tablet, Rfl: 0 .  ELIQUIS 2.5 MG TABS tablet, SMARTSIG:1 Tablet(s) By Mouth Every 12 Hours, Disp: , Rfl:  .  fluticasone furoate-vilanterol (BREO ELLIPTA) 100-25 MCG/INH AEPB, Inhale 1 puff into the lungs daily., Disp: 180 each, Rfl: 3 .  hydrALAZINE (APRESOLINE) 25 MG tablet, TAKE 1 TABLET THREE TIMES A DAY, Disp: 270 tablet, Rfl: 4 .  insulin regular human CONCENTRATED (HUMULIN R) 500 UNIT/ML injection, Inject up to 25  units twice a day, or as directed by physician, Disp: 60 mL, Rfl: 3 .  ipratropium-albuterol (DUONEB) 0.5-2.5 (3) MG/3ML SOLN, INHALE 3 ML BY NEBULIZATION EVERY 4 HOURS AS NEEDED, Disp: 360 mL, Rfl: 8 .  levothyroxine (SYNTHROID) 100 MCG tablet, Take 1 tablet (100 mcg total) by mouth daily., Disp: 90 tablet, Rfl: 3 .  metoprolol tartrate (LOPRESSOR) 50 MG tablet, Take 0.5 tablets (25 mg total) by mouth 2 (two) times daily., Disp: , Rfl:  .  Misc. Devices (PULSE OXIMETER FOR FINGER) MISC, 1 Device by Does not apply route daily as needed., Disp: 1 each, Rfl: 0 .  pantoprazole (PROTONIX) 40 MG tablet, TAKE 1 TABLET TWICE A DAY, Disp: 180 tablet, Rfl: 3 .  testosterone cypionate (DEPO-TESTOSTERONE) 200 MG/ML injection, Inject 1 mL (200 mg total) into the muscle every 14 (fourteen) days., Disp: 6 mL, Rfl: 1 .  ULORIC 80 MG TABS, Take 80 mg by mouth daily. , Disp: , Rfl:  .  vitamin B-12 (CYANOCOBALAMIN) 1000 MCG tablet, Take 1,000 mcg by mouth daily., Disp: , Rfl:   Past Medical History: Past Medical History:  Diagnosis Date  . Allergy   . Anemia   . Arthritis   . Bell's palsy   . CHF (congestive heart failure) ([place])   . CKD (chronic kidney disease)   . Depression   . Diabetes ([place])   .  Gastric ulcer   . GERD (gastroesophageal reflux disease)   . Gout   . History of chicken pox   . Hyperlipidemia   . Hypertension   . Hypothyroidism   . OSA (obstructive sleep apnea)   . Thyroid disease   . Vitamin D deficiency     Tobacco Use: Social History   Tobacco Use  Smoking Status Former Smoker  . Packs/day: 1.00  . Years: 15.00  . Pack years: 15.00  . Types: Cigarettes  . Quit date: 05/03/1978  . Years since quitting: 41.0  Smokeless Tobacco Former [place]   started smoking at age 59    Labs: Recent Review [facility] Latest Ref Rng & Units 11/18/2017 02/20/2018 06/21/2018 03/02/2019 03/03/2019   Cholestrol 100 - 199 mg/dL  125 101 - - -   LDLCALC 0 - 99 mg/dL Comment 35 - - -   HDL >39 mg/dL 20(L) 24(L) - - -   Trlycerides 0 - 149 mg/dL 449(H) 208(H) - - -   Hemoglobin A1c 4.8 - 5.6 % 5.1 5.6 - 6.1(H) -   PHART 7.350 - 7.450 - - - - 7.38   PCO2ART 32.0 - 48.0 mmHg - - - - 37   HCO3 20.0 - 28.0 mmol/L - - 24.8 - 21.9   ACIDBASEDEF 0.0 - 2.0 mmol/L - - 1.1 - 2.8(H)   O2SAT % - - 70.4 - 92.3       Pulmonary Assessment Scores: Pulmonary Assessment Scores    Row Name 03/20/19 1228         ADL UCSD   ADL Phase  Entry     SOB Score total  16     Rest  0     Walk  1     Stairs  3     Bath  0     Dress  0     Shop  1       CAT Score   CAT Score  12       mMRC Score   mMRC Score  1        UCSD: Self-administered rating of dyspnea associated with activities of daily living (ADLs) 6-point scale (0 = "not at all" to 5 = "maximal or unable to do because of breathlessness")  Scoring Scores range from 0 to 120.  Minimally important difference is 5 units  CAT: CAT can identify the health impairment of COPD patients and is better correlated with disease progression.  CAT has a scoring range of zero to 40. The CAT score is classified into four groups of low (less than 10), medium (10 - 20), high (21-30) and very high (31-40) based on the impact level of disease on health status. A CAT score over 10 suggests significant symptoms.  A worsening CAT score could be explained by an exacerbation, poor medication adherence, poor inhaler technique, or progression of COPD or comorbid conditions.  CAT MCID is 2 points  mMRC: mMRC (Modified Medical Research Council) Dyspnea Scale is used to assess the degree of baseline functional disability in patients of respiratory disease due to dyspnea. No minimal important difference is established. A decrease in score of 1 point or greater is considered a positive change.   Pulmonary Function Assessment: Pulmonary Function Assessment - 03/19/19 1204      Breath    Shortness of Breath  Yes;Panic with Shortness of Breath;Limiting activity  Exercise Target Goals: Exercise Program Goal: Individual exercise prescription set using results from initial 6 min walk test and THRR while considering  patient's activity barriers and safety.   Exercise Prescription Goal: Initial exercise prescription builds to 30-45 minutes a day of aerobic activity, 2-3 days per week.  Home exercise guidelines will be given to patient during program as part of exercise prescription that the participant will acknowledge.  Activity Barriers & Risk Stratification: Activity Barriers & Cardiac Risk Stratification - 03/20/19 1219      Activity Barriers & Cardiac Risk Stratification   Activity Barriers  Arthritis;Joint Problems;Deconditioning;Muscular Weakness;Shortness of Breath;Balance Concerns       6 Minute Walk: 6 Minute Walk    Row Name 03/20/19 1215         6 Minute Walk   Phase  Initial     Distance  1060 feet     Walk Time  6 minutes     # of Rest Breaks  0     MPH  2.08     METS  2.72     RPE  8     Perceived Dyspnea   1     VO2 Peak  9.51     Symptoms  Yes (comment)     Comments  knee pain 5/10     Resting HR  67 bpm     Resting BP  152/74     Resting Oxygen Saturation   94 %     Exercise Oxygen Saturation  during 6 min walk  89 %     Max Ex. HR  109 bpm     Max Ex. BP  184/62     2 Minute Post BP  162/84       Interval HR   1 Minute HR  109     2 Minute HR  100     3 Minute HR  89     4 Minute HR  90     5 Minute HR  104     6 Minute HR  104     2 Minute Post HR  84     Interval Heart Rate?  Yes       Interval Oxygen   Interval Oxygen?  Yes     Baseline Oxygen Saturation %  94 %     1 Minute Oxygen Saturation %  91 %     1 Minute Liters of Oxygen  0 L Room Air     2 Minute Oxygen Saturation %  89 %     2 Minute Liters of Oxygen  0 L     3 Minute Oxygen Saturation %  89 %     3 Minute Liters of Oxygen  0 L     4 Minute Oxygen  Saturation %  89 %     4 Minute Liters of Oxygen  0 L     5 Minute Oxygen Saturation %  91 %     5 Minute Liters of Oxygen  0 L     6 Minute Oxygen Saturation %  97 %     6 Minute Liters of Oxygen  0 L     2 Minute Post Oxygen Saturation %  96 %     2 Minute Post Liters of Oxygen  0 L       Oxygen Initial Assessment: Oxygen Initial Assessment - 03/19/19 1202      Home Oxygen   Home Oxygen Device  Home Concentrator    Sleep Oxygen Prescription  BiPAP    Liters per minute  2    Home Exercise Oxygen Prescription  None    Home at Rest Exercise Oxygen Prescription  None    Compliance with Home Oxygen Use  Yes      Initial 6 min Walk   Oxygen Used  None      Program Oxygen Prescription   Program Oxygen Prescription  None      Intervention   Short Term Goals  To learn and exhibit compliance with exercise, home and travel O2 prescription;To learn and understand importance of monitoring SPO2 with pulse oximeter and demonstrate accurate use of the pulse oximeter.;To learn and understand importance of maintaining oxygen saturations>88%;To learn and demonstrate proper pursed lip breathing techniques or other breathing techniques.;To learn and demonstrate proper use of respiratory medications    Long  Term Goals  Exhibits compliance with exercise, home and travel O2 prescription;Verbalizes importance of monitoring SPO2 with pulse oximeter and return demonstration;Exhibits proper breathing techniques, such as pursed lip breathing or other method taught during program session;Maintenance of O2 saturations>88%;Demonstrates proper use of MDI's;Compliance with respiratory medication       Oxygen Re-Evaluation: Oxygen Re-Evaluation    Row Name 04/11/19 0844             Program Oxygen Prescription   Program Oxygen Prescription  None         Home Oxygen   Home Oxygen Device  Home Concentrator       Sleep Oxygen Prescription  BiPAP       Liters per minute  2       Home Exercise Oxygen  Prescription  None       Home at Rest Exercise Oxygen Prescription  None       Compliance with Home Oxygen Use  Yes         Goals/Expected Outcomes   Short Term Goals  To learn and exhibit compliance with exercise, home and travel O2 prescription;To learn and understand importance of monitoring SPO2 with pulse oximeter and demonstrate accurate use of the pulse oximeter.;To learn and understand importance of maintaining oxygen saturations>88%;To learn and demonstrate proper pursed lip breathing techniques or other breathing techniques.;To learn and demonstrate proper use of respiratory medications       Long  Term Goals  Exhibits compliance with exercise, home and travel O2 prescription;Verbalizes importance of monitoring SPO2 with pulse oximeter and return demonstration;Maintenance of O2 saturations>88%;Exhibits proper breathing techniques, such as pursed lip breathing or other method taught during program session;Compliance with respiratory medication;Demonstrates proper use of MDI's       Comments  Practiced PLB with patient. Patient understands why PLB is important and to use it when he is short of breath. Reviewed that oxygen saturations should be 88 percent and above. Patient has a pulse oximeter at home to check his oxygen.       Goals/Expected Outcomes  Short: use PLB with exertion. Long: use PLB on exertion proficiently and independently.          Oxygen Discharge (Final Oxygen Re-Evaluation): Oxygen Re-Evaluation - 04/11/19 0844      Program Oxygen Prescription   Program Oxygen Prescription  None      Home Oxygen   Home Oxygen Device  Home Concentrator    Sleep Oxygen Prescription  BiPAP    Liters per minute  2    Home Exercise Oxygen Prescription  None    Home at Rest Exercise  Oxygen Prescription  None    Compliance with Home Oxygen Use  Yes      Goals/Expected Outcomes   Short Term Goals  To learn and exhibit compliance with exercise, home and travel O2 prescription;To learn  and understand importance of monitoring SPO2 with pulse oximeter and demonstrate accurate use of the pulse oximeter.;To learn and understand importance of maintaining oxygen saturations>88%;To learn and demonstrate proper pursed lip breathing techniques or other breathing techniques.;To learn and demonstrate proper use of respiratory medications    Long  Term Goals  Exhibits compliance with exercise, home and travel O2 prescription;Verbalizes importance of monitoring SPO2 with pulse oximeter and return demonstration;Maintenance of O2 saturations>88%;Exhibits proper breathing techniques, such as pursed lip breathing or other method taught during program session;Compliance with respiratory medication;Demonstrates proper use of MDI's    Comments  Practiced PLB with patient. Patient understands why PLB is important and to use it when he is short of breath. Reviewed that oxygen saturations should be 88 percent and above. Patient has a pulse oximeter at home to check his oxygen.    Goals/Expected Outcomes  Short: use PLB with exertion. Long: use PLB on exertion proficiently and independently.       Initial Exercise Prescription: Initial Exercise Prescription - 03/20/19 1200      Date of Initial Exercise RX and Referring Provider   Date  03/20/19    Referring Provider  Lujean Amel MD      Treadmill   MPH  2    Grade  0    Minutes  15    METs  2.53      NuStep   Level  2    SPM  80    Minutes  15    METs  2.5      Recumbant Elliptical   Level  1    RPM  50    Minutes  15    METs  2.5      Prescription Details   Frequency (times per week)  3    Duration  Progress to 30 minutes of continuous aerobic without signs/symptoms of physical distress      Intensity   THRR 40-80% of Max Heartrate  104-141    Ratings of Perceived Exertion  11-13    Perceived Dyspnea  0-4      Progression   Progression  Continue to progress workloads to maintain intensity without signs/symptoms of  physical distress.      Resistance Training   Training Prescription  Yes    Weight  3 lb    Reps  10-15       Perform Capillary Blood Glucose checks as needed.  Exercise Prescription Changes: Exercise Prescription Changes    Row Name 03/20/19 1200 03/28/19 0700 04/05/19 1100 04/18/19 1100       Response to Exercise   Blood Pressure (Admit)  152/74  --  160/54  148/68    Blood Pressure (Exercise)  184/62  --  184/62  168/66    Blood Pressure (Exit)  162/84  --  154/58  150/66    Heart Rate (Admit)  67 bpm  --  75 bpm  103 bpm    Heart Rate (Exercise)  109 bpm  --  82 bpm  129 bpm    Heart Rate (Exit)  66 bpm  --  73 bpm  77 bpm    Oxygen Saturation (Admit)  94 %  --  92 %  92 %    Oxygen  Saturation (Exercise)  89 %  --  92 %  90 %    Oxygen Saturation (Exit)  94 %  --  94 %  94 %    Rating of Perceived Exertion (Exercise)  8  --  13  13    Perceived Dyspnea (Exercise)  1  --  --  1    Symptoms  knee pain 5/10  --  --  none    Comments  walk test results  --  --  --    Duration  --  --  Continue with 30 min of aerobic exercise without signs/symptoms of physical distress.  Continue with 30 min of aerobic exercise without signs/symptoms of physical distress.    Intensity  --  --  THRR unchanged  THRR unchanged      Progression   Progression  --  --  Continue to progress workloads to maintain intensity without signs/symptoms of physical distress.  Continue to progress workloads to maintain intensity without signs/symptoms of physical distress.    Average METs  --  --  2.72  2.75      Resistance Training   Training Prescription  --  --  Yes  Yes    Weight  --  --  4 lb  4 lb    Reps  --  --  10-15  10-15      Interval Training   Interval Training  --  --  No  No      Treadmill   MPH  --  --  2  --    Grade  --  --  0  --    Minutes  --  --  15  --    METs  --  --  2.53  --      NuStep   Level  --  --  3  3    SPM  --  --  80  --    Minutes  --  --  15  15    METs  --   --  3  3.2      Recumbant Elliptical   Level  --  --  3  3    RPM  --  --  50  --    Minutes  --  --  15  15    METs  --  --  2.3  2.3      Home Exercise Plan   Plans to continue exercise at  --  Home (comment)  Home (comment)  Home (comment)    Frequency  --  Add 1 additional day to program exercise sessions.  Add 1 additional day to program exercise sessions.  Add 1 additional day to program exercise sessions.    Initial Home Exercises Provided  --  03/28/19  03/28/19  03/28/19       Exercise Comments: Exercise Comments    Row Name 03/21/19 0746 03/26/19 0741 03/28/19 0736 04/20/19 0915 04/20/19 0935   Exercise Comments  First full day of exercise!  Patient was oriented to gym and equipment including functions, settings, policies, and procedures.  Patient's individual exercise prescription and treatment plan were reviewed.  All starting workloads were established based on the results of the 6 minute walk test done at initial orientation visit.  The plan for exercise progression was also introduced and progression will be customized based on patient's performance and goals.  Weight up 2 pounds. Fluid pill stopped last week.  Has appt  with MD on Wed this week.  He will call MD if weight continues to increase.  Weight up. Has appt with Kidney MD today and plans to talk about weight gain.Had called office and no response as of this AM  Noticed weight has been rising since admission. Patrick Hurst has appointment at [facility] today and has been given his exercise Log to share all session weights.  Note From [facility] today FYI: Dr. [name] (nephrology) had stopped his torsemide due to worsening GFR. Labs were checked on 12/16 but results aren't in. In Patrick Hurst's note, he said patient could take torsemide as needed but he was waiting on the lab results first. I advised the patient to call Patrick Hurst's officer later today if he hasn't heard anything so he would know if he could resume the  torsemide.      Exercise Goals and Review: Exercise Goals    Row Name 03/20/19 1222             Exercise Goals   Increase Physical Activity  Yes       Intervention  Provide advice, education, support and counseling about physical activity/exercise needs.;Develop an individualized exercise prescription for aerobic and resistive training based on initial evaluation findings, risk stratification, comorbidities and participant's personal goals.       Expected Outcomes  Short Term: Attend rehab on a regular basis to increase amount of physical activity.;Long Term: Add in home exercise to make exercise part of routine and to increase amount of physical activity.;Long Term: Exercising regularly at least 3-5 days a week.       Increase Strength and Stamina  Yes       Intervention  Provide advice, education, support and counseling about physical activity/exercise needs.;Develop an individualized exercise prescription for aerobic and resistive training based on initial evaluation findings, risk stratification, comorbidities and participant's personal goals.       Expected Outcomes  Short Term: Increase workloads from initial exercise prescription for resistance, speed, and METs.;Short Term: Perform resistance training exercises routinely during rehab and add in resistance training at home;Long Term: Improve cardiorespiratory fitness, muscular endurance and strength as measured by increased METs and functional capacity (6MWT)       Able to understand and use rate of perceived exertion (RPE) scale  Yes       Intervention  Provide education and explanation on how to use RPE scale       Expected Outcomes  Short Term: Able to use RPE daily in rehab to express subjective intensity level;Long Term:  Able to use RPE to guide intensity level when exercising independently       Knowledge and understanding of Target Heart Rate Range (THRR)  Yes       Intervention  Provide education and explanation of THRR  including how the numbers were predicted and where they are located for reference       Expected Outcomes  Short Term: Able to state/look up THRR;Short Term: Able to use daily as guideline for intensity in rehab;Long Term: Able to use THRR to govern intensity when exercising independently       Able to check pulse independently  Yes       Intervention  Provide education and demonstration on how to check pulse in carotid and radial arteries.;Review the importance of being able to check your own pulse for safety during independent exercise       Expected Outcomes  Short Term: Able to explain why pulse checking is  important during independent exercise;Long Term: Able to check pulse independently and accurately       Understanding of Exercise Prescription  Yes       Intervention  Provide education, explanation, and written materials on patient's individual exercise prescription       Expected Outcomes  Short Term: Able to explain program exercise prescription;Long Term: Able to explain home exercise prescription to exercise independently          Exercise Goals Re-Evaluation : Exercise Goals Re-Evaluation    Row Name 03/21/19 0746 03/28/19 0753 04/05/19 1207 04/18/19 1117       Exercise Goal Re-Evaluation   Exercise Goals Review  Increase Physical Activity;Able to understand and use rate of perceived exertion (RPE) scale;Knowledge and understanding of Target Heart Rate Range (THRR);Understanding of Exercise Prescription;Able to understand and use Dyspnea scale  Increase Physical Activity;Increase Strength and Stamina;Able to understand and use rate of perceived exertion (RPE) scale;Able to understand and use Dyspnea scale;Knowledge and understanding of Target Heart Rate Range (THRR);Able to check pulse independently;Understanding of Exercise Prescription  Increase Physical Activity;Increase Strength and Stamina;Able to understand and use rate of perceived exertion (RPE) scale;Able to understand and use  Dyspnea scale;Knowledge and understanding of Target Heart Rate Range (THRR);Able to check pulse independently;Understanding of Exercise Prescription  Increase Physical Activity;Increase Strength and Stamina;Understanding of Exercise Prescription    Comments  Reviewed RPE scale, THR and program prescription with pt today.  Pt voiced understanding and was given a copy of goals to take home.  Reviewed home exercise with pt today.  Pt plans to use TM at home for exercise.  Reviewed THR, pulse, RPE, sign and symptoms, NTG use, and when to call 911 or MD.  Also discussed weather considerations and indoor options.  Pt voiced understanding.  Patrick Hurst is doing well with exercise.  He has increased levels on most machines and his oxygen stays above 90.  He has increased to 4 lb weights  Patrick Hurst continues to do well in rehab.  He is already up to level 3 on the NuStep and XR.  We will continue to monitor his progress.    Expected Outcomes  Short: Use RPE daily to regulate intensity. Long: Follow program prescription in THR.  Short - unpack TM at home to get ready to use Long - exercise at home on days not at [place] - attend LW consistently Long - increase stamina overall  Short: Continue to improve workloads.  Long: Continue to improve stamina.       Discharge Exercise Prescription (Final Exercise Prescription Changes): Exercise Prescription Changes - 04/18/19 1100      Response to Exercise   Blood Pressure (Admit)  148/68    Blood Pressure (Exercise)  168/66    Blood Pressure (Exit)  150/66    Heart Rate (Admit)  103 bpm    Heart Rate (Exercise)  129 bpm    Heart Rate (Exit)  77 bpm    Oxygen Saturation (Admit)  92 %    Oxygen Saturation (Exercise)  90 %    Oxygen Saturation (Exit)  94 %    Rating of Perceived Exertion (Exercise)  13    Perceived Dyspnea (Exercise)  1    Symptoms  none    Duration  Continue with 30 min of aerobic exercise without signs/symptoms of physical distress.    Intensity  THRR  unchanged      Progression   Progression  Continue to progress workloads to maintain intensity without signs/symptoms  of physical distress.    Average METs  2.75      Resistance Training   Training Prescription  Yes    Weight  4 lb    Reps  10-15      Interval Training   Interval Training  No      NuStep   Level  3    Minutes  15    METs  3.2      Recumbant Elliptical   Level  3    Minutes  15    METs  2.3      Home Exercise Plan   Plans to continue exercise at  Home (comment)    Frequency  Add 1 additional day to program exercise sessions.    Initial Home Exercises Provided  03/28/19       Nutrition:  Target Goals: Understanding of nutrition guidelines, daily intake of sodium <1555m, cholesterol <2075m calories 30% from fat and 7% or less from saturated fats, daily to have 5 or more servings of fruits and vegetables.  Biometrics: Pre Biometrics - 03/20/19 1225      Pre Biometrics   Height  5' 10.6" (1.793 m)    Weight  278 lb 9.6 oz (126.4 kg)    BMI (Calculated)  39.31    Single Leg Stand  1.31 seconds        Nutrition Therapy Plan and Nutrition Goals: Nutrition Therapy & Goals - 04/03/19 0914      Nutrition Therapy   Diet  Renal, HH, DM, Low Na diet    Drug/Food Interactions  Purine/Gout    Protein (specify units)  100g    Fiber  30 grams    Whole Grain Foods  3 servings    Saturated Fats  12 max. grams    Fruits and Vegetables  5 servings/day    Sodium  1.5 grams      Personal Nutrition Goals   Nutrition Goal  ST: continue with current changes LT: Lose weight    Comments  Not on dialysis. Renal doctor 2-3 months (blood work 1x/month): pt reports improved kidney function at 13%; pt reports that renal MD stated that if he says at 13% he does not need dialysis.  A1c 6.1%. 4-6 oz of coffee. Lunch 12 oz of water. Afternoon 16 oz. Dinner [id number]: 2 packets sugar free oatmeal w/ cup of fruit. L: sometimes: sandwich (chicken salad) with cup of fruit. D: meat  w/ two vegetables (chicken baked in oven) variety of vegetables. Uses airfyer. Discussed DM, renal, HH eating. Discussed set point weight range and health outcomes. Pt would not like to make any goals at this time. Pt reports edema with some pitting, not on fluid pill.      Intervention Plan   Intervention  Prescribe, educate and counsel regarding individualized specific dietary modifications aiming towards targeted core components such as weight, hypertension, lipid management, diabetes, heart failure and other comorbidities.;Nutrition handout(s) given to patient.    Expected Outcomes  Short Term Goal: Understand basic principles of dietary content, such as calories, fat, sodium, cholesterol and nutrients.;Short Term Goal: A plan has been developed with personal nutrition goals set during dietitian appointment.;Long Term Goal: Adherence to prescribed nutrition plan.       Nutrition Assessments: Nutrition Assessments - 03/20/19 1226      MEDFICTS Scores   Pre Score  60       Nutrition Goals Re-Evaluation:   Nutrition Goals Discharge (Final Nutrition Goals Re-Evaluation):   Psychosocial: Target  Goals: Acknowledge presence or absence of significant depression and/or stress, maximize coping skills, provide positive support system. Participant is able to verbalize types and ability to use techniques and skills needed for reducing stress and depression.   Initial Review & Psychosocial Screening: Initial Psych Review & Screening - 03/19/19 1204      Initial Review   Current issues with  None Identified      Family Dynamics   Good Support System?  Yes    Comments  He can look to his wife, two sons and grand children for supprort. He states he has a positive outlook on life in genral.      Barriers   Psychosocial barriers to participate in program  The patient should benefit from training in stress management and relaxation.      Screening Interventions   Interventions  To provide  support and resources with identified psychosocial needs;Provide feedback about the scores to participant;Encouraged to exercise    Expected Outcomes  Short Term goal: Identification and review with participant of any Quality of Life or Depression concerns found by scoring the questionnaire.;Short Term goal: Utilizing psychosocial counselor, staff and physician to assist with identification of specific Stressors or current issues interfering with healing process. Setting desired goal for each stressor or current issue identified.;Long Term Goal: Stressors or current issues are controlled or eliminated.;Long Term goal: The participant improves quality of Life and PHQ9 Scores as seen by post scores and/or verbalization of changes       Quality of Life Scores:  Scores of 19 and below usually indicate a poorer quality of life in these areas.  A difference of  2-3 points is a clinically meaningful difference.  A difference of 2-3 points in the total score of the Quality of Life Index has been associated with significant improvement in overall quality of life, self-image, physical symptoms, and general health in studies assessing change in quality of life.  PHQ-9: Recent Review Flowsheet Data    Depression screen [facility] 2/9 04/18/2019 03/20/2019 11/18/2017 12/08/2016 02/16/2016   Decreased Interest _0 0 1   Down, Depressed, Hopeless 0 0 1 0 1   PHQ - 2 Score _1 0 2   Altered sleeping 0 0 0 0 2   Tired, decreased energy _2 Change in appetite 1 0 0 1 2   Feeling bad or failure about yourself  0 0 0 0 1   Trouble concentrating 0 0 0 0 1   Moving slowly or fidgety/restless 0 0 0 0 1   Suicidal thoughts 0 0 0 0 0   PHQ-9 Score _3 Difficult doing work/chores Somewhat difficult Somewhat difficult Not difficult at all Somewhat difficult Somewhat difficult     Interpretation of Total Score  Total Score Depression Severity:  1-4 = Minimal depression, 5-9 = Mild depression, 10-14 =  Moderate depression, 15-19 = Moderately severe depression, 20-27 = Severe depression   Psychosocial Evaluation and Intervention:   Psychosocial Re-Evaluation: Psychosocial Re-Evaluation    Row Name 04/11/19 0927             Psychosocial Re-Evaluation   Current issues with  Current Stress Concerns       Comments  Patient has shortness of breath and that can stress him out. He has a  positive attitude and is willing to make changes for his health and mental health.       Expected  Outcomes  Short: Attend [facility] stress management education to decrease stress. Long: Maintain exercise Post [facility] to keep stress at a minimum.       Interventions  Encouraged to attend Pulmonary Rehabilitation for the exercise       Continue Psychosocial Services   Follow up required by staff          Psychosocial Discharge (Final Psychosocial Re-Evaluation): Psychosocial Re-Evaluation - 04/11/19 0927      Psychosocial Re-Evaluation   Current issues with  Current Stress Concerns    Comments  Patient has shortness of breath and that can stress him out. He has a  positive attitude and is willing to make changes for his health and mental health.    Expected Outcomes  Short: Attend [facility] stress management education to decrease stress. Long: Maintain exercise Post [facility] to keep stress at a minimum.    Interventions  Encouraged to attend Pulmonary Rehabilitation for the exercise    Continue Psychosocial Services   Follow up required by staff       Education: Education Goals: Education classes will be provided on a weekly basis, covering required topics. Participant will state understanding/return demonstration of topics presented.  Learning Barriers/Preferences: Learning Barriers/Preferences - 03/19/19 1206      Learning Barriers/Preferences   Learning Barriers  None    Learning Preferences  None       Education Topics:  Initial Evaluation Education: - Verbal, written and demonstration  of respiratory meds, oximetry and breathing techniques. Instruction on use of nebulizers and MDIs and importance of monitoring MDI activations.   Pulmonary Rehab from 03/21/2019 in [facility] Cardiac and Pulmonary Rehab  Date  03/19/19  Educator  [facility]  Instruction Review Code  1- Verbalizes Understanding      General Nutrition Guidelines/Fats and Fiber: -Group instruction provided by verbal, written material, models and posters to present the general guidelines for heart healthy nutrition. Gives an explanation and review of dietary fats and fiber.   Pulmonary Rehab from 03/21/2019 in [facility] Cardiac and Pulmonary Rehab  Date  03/21/19  Educator  [facility],RD  Instruction Review Code  1- Verbalizes Understanding      Controlling Sodium/Reading Food Labels: -Group verbal and written material supporting the discussion of sodium use in heart healthy nutrition. Review and explanation with models, verbal and written materials for utilization of the food label.   Exercise Physiology & General Exercise Guidelines: - Group verbal and written instruction with models to review the exercise physiology of the cardiovascular system and associated critical values. Provides general exercise guidelines with specific guidelines to those with heart or lung disease.    Aerobic Exercise & Resistance Training: - Gives group verbal and written instruction on the various components of exercise. Focuses on aerobic and resistive training programs and the benefits of this training and how to safely progress through these programs.   Flexibility, Balance, Mind/Body Relaxation: Provides group verbal/written instruction on the benefits of flexibility and balance training, including mind/body exercise modes such as yoga, pilates and tai chi.  Demonstration and skill practice provided.   Pulmonary Rehab from 03/21/2019 in [facility] Cardiac and Pulmonary Rehab  Date  03/21/19  Educator  AS  Instruction Review Code  1- Verbalizes  Understanding      Stress and Anxiety: - Provides group verbal and written instruction about the health risks of elevated stress and causes of high stress.  Discuss the correlation between heart/lung disease and anxiety and treatment options. Review healthy ways to manage with stress and  anxiety.   Depression: - Provides group verbal and written instruction on the correlation between heart/lung disease and depressed mood, treatment options, and the stigmas associated with seeking treatment.   Exercise & Equipment Safety: - Individual verbal instruction and demonstration of equipment use and safety with use of the equipment.   Pulmonary Rehab from 03/21/2019 in [facility] Cardiac and Pulmonary Rehab  Date  03/20/19  Educator  [facility]  Instruction Review Code  1- Verbalizes Understanding      Infection Prevention: - Provides verbal and written material to individual with discussion of infection control including proper hand washing and proper equipment cleaning during exercise session.   Pulmonary Rehab from 03/21/2019 in [facility] Cardiac and Pulmonary Rehab  Date  03/19/19  Educator  [facility]  Instruction Review Code  1- Verbalizes Understanding      Falls Prevention: - Provides verbal and written material to individual with discussion of falls prevention and safety.   Pulmonary Rehab from 03/21/2019 in [facility] Cardiac and Pulmonary Rehab  Date  03/19/19  Educator  [facility]  Instruction Review Code  1- Verbalizes Understanding      Diabetes: - Individual verbal and written instruction to review signs/symptoms of diabetes, desired ranges of glucose level fasting, after meals and with exercise. Advice that pre and post exercise glucose checks will be done for 3 sessions at entry of program.   Pulmonary Rehab from 03/21/2019 in [facility] Cardiac and Pulmonary Rehab  Date  03/19/19  Educator  [facility]  Instruction Review Code  1- Verbalizes Understanding      Chronic Lung Diseases: - Group verbal and written  instruction to review updates, respiratory medications, advancements in procedures and treatments. Discuss use of supplemental oxygen including available portable oxygen systems, continuous and intermittent flow rates, concentrators, personal use and safety guidelines. Review proper use of inhaler and spacers. Provide informative websites for self-education.    Energy Conservation: - Provide group verbal and written instruction for methods to conserve energy, plan and organize activities. Instruct on pacing techniques, use of adaptive equipment and posture/positioning to relieve shortness of breath.   Triggers and Exacerbations: - Group verbal and written instruction to review types of environmental triggers and ways to prevent exacerbations. Discuss weather changes, air quality and the benefits of nasal washing. Review warning signs and symptoms to help prevent infections. Discuss techniques for effective airway clearance, coughing, and vibrations.   AED/CPR: - Group verbal and written instruction with the use of models to demonstrate the basic use of the AED with the basic ABC's of resuscitation.   Anatomy and Physiology of the Lungs: - Group verbal and written instruction with the use of models to provide basic lung anatomy and physiology related to function, structure and complications of lung disease.   Anatomy & Physiology of the Heart: - Group verbal and written instruction and models provide basic cardiac anatomy and physiology, with the coronary electrical and arterial systems. Review of Valvular disease and Heart Failure   Cardiac Medications: - Group verbal and written instruction to review commonly prescribed medications for heart disease. Reviews the medication, class of the drug, and side effects.   Know Your Numbers and Risk Factors: -Group verbal and written instruction about important numbers in your health.  Discussion of what are risk factors and how they play a role in  the disease process.  Review of Cholesterol, Blood Pressure, Diabetes, and BMI and the role they play in your overall health.   Sleep Hygiene: -Provides group verbal and written instruction about  how sleep can affect your health.  Define sleep hygiene, discuss sleep cycles and impact of sleep habits. Review good sleep hygiene tips.    Other: -Provides group and verbal instruction on various topics (see comments)    Knowledge Questionnaire Score: Knowledge Questionnaire Score - 03/20/19 1226      Knowledge Questionnaire Score   Pre Score  15/16 Education Focus: Oxygen safety        Core Components/Risk Factors/Patient Goals at Admission: Personal Goals and Risk Factors at Admission - 03/20/19 1227      Core Components/Risk Factors/Patient Goals on Admission    Weight Management  Yes;Weight Loss;Obesity    Intervention  Weight Management: Develop a combined nutrition and exercise program designed to reach desired caloric intake, while maintaining appropriate intake of nutrient and fiber, sodium and fats, and appropriate energy expenditure required for the weight goal.;Weight Management: Provide education and appropriate resources to help participant work on and attain dietary goals.;Weight Management/Obesity: Establish reasonable short term and long term weight goals.;Obesity: Provide education and appropriate resources to help participant work on and attain dietary goals.    Admit Weight  278 lb 9.6 oz (126.4 kg)    Goal Weight: Short Term  273 lb (123.8 kg)    Goal Weight: Long Term  268 lb (121.6 kg)    Expected Outcomes  Short Term: Continue to assess and modify interventions until short term weight is achieved;Long Term: Adherence to nutrition and physical activity/exercise program aimed toward attainment of established weight goal;Weight Loss: Understanding of general recommendations for a balanced deficit meal plan, which promotes 1-2 lb weight loss per week and includes a negative  energy balance of [id number] kcal/d;Understanding recommendations for meals to include 15-35% energy as protein, 25-35% energy from fat, 35-60% energy from carbohydrates, less than 283m of dietary cholesterol, 20-35 gm of total fiber daily;Understanding of distribution of calorie intake throughout the day with the consumption of 4-5 meals/snacks    Improve shortness of breath with ADL's  Yes    Intervention  Provide education, individualized exercise plan and daily activity instruction to help decrease symptoms of SOB with activities of daily living.    Expected Outcomes  Short Term: Improve cardiorespiratory fitness to achieve a reduction of symptoms when performing ADLs;Long Term: Be able to perform more ADLs without symptoms or delay the onset of symptoms    Diabetes  Yes    Intervention  Provide education about signs/symptoms and action to take for hypo/hyperglycemia.;Provide education about proper nutrition, including hydration, and aerobic/resistive exercise prescription along with prescribed medications to achieve blood glucose in normal ranges: Fasting glucose 65-99 mg/dL    Expected Outcomes  Short Term: Participant verbalizes understanding of the signs/symptoms and immediate care of hyper/hypoglycemia, proper foot care and importance of medication, aerobic/resistive exercise and nutrition plan for blood glucose control.;Long Term: Attainment of HbA1C < 7%.    Heart Failure  Yes    Intervention  Provide a combined exercise and nutrition program that is supplemented with education, support and counseling about heart failure. Directed toward relieving symptoms such as shortness of breath, decreased exercise tolerance, and extremity edema.    Expected Outcomes  Improve functional capacity of life;Short term: Attendance in program 2-3 days a week with increased exercise capacity. Reported lower sodium intake. Reported increased fruit and vegetable intake. Reports medication compliance.;Short term: Daily  weights obtained and reported for increase. Utilizing diuretic protocols set by physician.;Long term: Adoption of self-care skills and reduction of barriers for early signs and symptoms recognition  and intervention leading to self-care maintenance.    Hypertension  Yes    Intervention  Provide education on lifestyle modifcations including regular physical activity/exercise, weight management, moderate sodium restriction and increased consumption of fresh fruit, vegetables, and low fat dairy, alcohol moderation, and smoking cessation.;Monitor prescription use compliance.    Expected Outcomes  Short Term: Continued assessment and intervention until BP is < 140/63m HG in hypertensive participants. < 130/847mHG in hypertensive participants with diabetes, heart failure or chronic kidney disease.;Long Term: Maintenance of blood pressure at goal levels.    Lipids  Yes    Intervention  Provide education and support for participant on nutrition & aerobic/resistive exercise along with prescribed medications to achieve LDL <7055mHDL >43m85m  Expected Outcomes  Short Term: Participant states understanding of desired cholesterol values and is compliant with medications prescribed. Participant is following exercise prescription and nutrition guidelines.;Long Term: Cholesterol controlled with medications as prescribed, with individualized exercise RX and with personalized nutrition plan. Value goals: LDL < 70mg3mL > 40 mg.       Core Components/Risk Factors/Patient Goals Review:  Goals and Risk Factor Review    Row Name 04/11/19 0859             Core Components/Risk Factors/Patient Goals Review   Personal Goals Review  Weight Management/Obesity;Heart Failure;Hypertension;Diabetes;Improve shortness of breath with ADL's;Lipids       Review  TerryPlatonbeen doing well in the program but has gone up on his weight. He wants to lose weight and we talked about how portion sizes are important. TerryLeondrobeen  checking his sugar at home. His blood sugars have been in the 120s in the mornings and 150 at night. Shortness of breath is a problem for TerryGreywas informed to PLB when he is short of breath.       Expected Outcomes  Short: Attend [facility] regularly to improve shortness of breath with ADL's. Long: maintain independence with ADL's          Core Components/Risk Factors/Patient Goals at Discharge (Final Review):  Goals and Risk Factor Review - 04/11/19 0859      Core Components/Risk Factors/Patient Goals Review   Personal Goals Review  Weight Management/Obesity;Heart Failure;Hypertension;Diabetes;Improve shortness of breath with ADL's;Lipids    Review  TerryKhaleembeen doing well in the program but has gone up on his weight. He wants to lose weight and we talked about how portion sizes are important. TerryTrapperbeen checking his sugar at home. His blood sugars have been in the 120s in the mornings and 150 at night. Shortness of breath is a problem for TerryMicaelwas informed to PLB when he is short of breath.    Expected Outcomes  Short: Attend [facility] regularly to improve shortness of breath with ADL's. Long: maintain independence with ADL's       ITP Comments: ITP Comments    Row Name 03/19/19 1213 03/20/19 1158 03/26/19 0741 03/28/19 0735 04/03/19 0939   ITP Comments  Virtual Orientation performed. Patient informed when to come in for RD and EP orientation. Diagnosis can be found in [facility]/2020.  Completed 6MWT and gym orientation.  Initial ITP created and sent for review to Dr. Mark Emily Filbertical Director.  Weight up 2 pounds. Fluid pill stopped last week.  Has appt with MD on Wed this week.  He will call MD if weight continues to increase.  Weight up. Has appt with Kidney MD today and plans to talk  about weight gain. Had called office and no response as of this AM  Completed initial RD eval   Row Name 04/04/19 1027 04/20/19 0914 04/20/19 0933 05/02/19 0904     ITP Comments  30 day  review competed . ITP sent to Dr Emily Filbert for review, changes as needed and ITP approval signature.  Noticed weight has been rising since admission. Patrick Hurst has appointment at [facility] today and has been given his exercise Log to share all session weights.  Note From [facility] Hackney[facility] today FYI: Dr. [name] (nephrology) had stopped his torsemide due to worsening GFR. Labs were checked on 12/16 but results aren't in. In Patrick Hurst's note, he said patient could take torsemide as needed but he was waiting on the lab results first. I advised the patient to call Patrick Hurst's officer later today if he hasn't heard anything so he would know if he could resume the torsemide.  30 day review competed . ITP sent to Dr Emily Filbert for review, changes as needed and ITP approval signature       Comments:

## 2019-05-03 ENCOUNTER — Other Ambulatory Visit: Payer: Self-pay | Admitting: Family Medicine

## 2019-05-03 NOTE — Telephone Encounter (Signed)
Requested medication (s) are due for refill today: no  Requested medication (s) are on the active medication list: no  Last refill:  02/02/2019  Future visit scheduled: yes  Notes to clinic:  medication was stop at discharge Review for refill   Requested Prescriptions  Pending Prescriptions Disp Refills   amLODipine (NORVASC) 10 MG tablet [Pharmacy Med Name: AMLODIPINE BESYLATE TABS 10MG ] 90 tablet 3    Sig: TAKE 1 TABLET DAILY      Cardiovascular:  Calcium Channel Blockers Failed - 05/03/2019 12:29 AM      Failed - Last BP in normal range    BP Readings from Last 1 Encounters:  04/20/19 (!) 157/77          Passed - Valid encounter within last 6 months    Recent Outpatient Visits           1 month ago Multifocal pneumonia   [facility] [name], MD   9 months ago Schaller, Donald E, MD   10 months ago Transition of care performed with sharing of clinical summary   [facility], [place]   10 months ago Shortness of breath   [facility] [name], MD   11 months ago Hypertension, unspecified type   [facility] [name], MD       Future Appointments             In 1 month Fisher, Kirstie Peri, MD [facility], [place]

## 2019-05-03 NOTE — Telephone Encounter (Signed)
L.O.V. was on 03/23/2019 and upcoming visit on 06/25/2019.

## 2019-05-08 ENCOUNTER — Other Ambulatory Visit: Payer: Self-pay | Admitting: Family Medicine

## 2019-05-10 ENCOUNTER — Other Ambulatory Visit: Payer: Self-pay

## 2019-05-10 ENCOUNTER — Encounter: Payer: Commercial Managed Care - PPO | Attending: Internal Medicine | Admitting: *Deleted

## 2019-05-10 DIAGNOSIS — F329 Major depressive disorder, single episode, unspecified: Secondary | ICD-10-CM | POA: Diagnosis not present

## 2019-05-10 DIAGNOSIS — N189 Chronic kidney disease, unspecified: Secondary | ICD-10-CM | POA: Insufficient documentation

## 2019-05-10 DIAGNOSIS — Z79890 Hormone replacement therapy (postmenopausal): Secondary | ICD-10-CM | POA: Diagnosis not present

## 2019-05-10 DIAGNOSIS — Z79899 Other long term (current) drug therapy: Secondary | ICD-10-CM | POA: Insufficient documentation

## 2019-05-10 DIAGNOSIS — E785 Hyperlipidemia, unspecified: Secondary | ICD-10-CM | POA: Diagnosis not present

## 2019-05-10 DIAGNOSIS — G4733 Obstructive sleep apnea (adult) (pediatric): Secondary | ICD-10-CM | POA: Diagnosis not present

## 2019-05-10 DIAGNOSIS — Z794 Long term (current) use of insulin: Secondary | ICD-10-CM | POA: Diagnosis not present

## 2019-05-10 DIAGNOSIS — I509 Heart failure, unspecified: Secondary | ICD-10-CM | POA: Insufficient documentation

## 2019-05-10 DIAGNOSIS — K219 Gastro-esophageal reflux disease without esophagitis: Secondary | ICD-10-CM | POA: Insufficient documentation

## 2019-05-10 DIAGNOSIS — Z87891 Personal history of nicotine dependence: Secondary | ICD-10-CM | POA: Diagnosis not present

## 2019-05-10 DIAGNOSIS — I130 Hypertensive heart and chronic kidney disease with heart failure and stage 1 through stage 4 chronic kidney disease, or unspecified chronic kidney disease: Secondary | ICD-10-CM | POA: Insufficient documentation

## 2019-05-10 DIAGNOSIS — Z7901 Long term (current) use of anticoagulants: Secondary | ICD-10-CM | POA: Diagnosis not present

## 2019-05-10 DIAGNOSIS — E1122 Type 2 diabetes mellitus with diabetic chronic kidney disease: Secondary | ICD-10-CM | POA: Diagnosis not present

## 2019-05-10 DIAGNOSIS — M109 Gout, unspecified: Secondary | ICD-10-CM | POA: Diagnosis not present

## 2019-05-10 DIAGNOSIS — E039 Hypothyroidism, unspecified: Secondary | ICD-10-CM | POA: Diagnosis not present

## 2019-05-10 NOTE — Progress Notes (Signed)
Daily Session Note  Patient Details  Name: Patrick Hurst MRN: [id number] Date of Birth: 1956-03-17 Referring Provider:     Pulmonary Rehab from 03/20/2019 in [facility] Cardiac and Pulmonary Rehab  Referring Provider  Lujean Amel MD      Encounter Date: 05/10/2019  Check In: Session Check In - 05/10/19 0824      Check-In   Supervising physician immediately available to respond to emergencies  See telemetry face sheet for immediately available ER MD    Location  [facility]-Cardiac & Pulmonary Rehab    Staff Present  Heath Lark, RN, BSN, CCRP;Joseph Hood RCP,RRT,BSRT;Jessica Sherwood, [place], [place], [place], CCET    Virtual Visit  No    Medication changes reported      No    Fall or balance concerns reported     No    Warm-up and Cool-down  Performed on first and last piece of equipment    Resistance Training Performed  Yes    VAD Patient?  No    PAD/SET Patient?  No      Pain Assessment   Currently in Pain?  No/denies          Social History   Tobacco Use  Smoking Status Former Smoker  . Packs/day: 1.00  . Years: 15.00  . Pack years: 15.00  . Types: Cigarettes  . Quit date: 05/03/1978  . Years since quitting: 41.0  Smokeless Tobacco Former [place]   started smoking at age 39    Goals Met:  Independence with exercise equipment Exercise tolerated well No report of cardiac concerns or symptoms  Goals Unmet:  BP  High on arrival   Improved with rest: decrease workloads today  BP better at discharge  Comments: Pt able to follow exercise prescription today without complaint.  Will continue to monitor for progression.    Dr. Emily Filbert is Medical Director for [facility] and [facility].

## 2019-05-14 ENCOUNTER — Encounter: Payer: Commercial Managed Care - PPO | Admitting: *Deleted

## 2019-05-14 ENCOUNTER — Other Ambulatory Visit: Payer: Self-pay

## 2019-05-14 ENCOUNTER — Other Ambulatory Visit (HOSPITAL_COMMUNITY): Payer: Commercial Managed Care - PPO

## 2019-05-14 DIAGNOSIS — G4733 Obstructive sleep apnea (adult) (pediatric): Secondary | ICD-10-CM | POA: Diagnosis not present

## 2019-05-14 NOTE — Progress Notes (Signed)
Daily Session Note  Patient Details  Name: Patrick Hurst MRN: [id number] Date of Birth: 10-29-1955 Referring Provider:     Pulmonary Rehab from 03/20/2019 in [facility] Cardiac and Pulmonary Rehab  Referring Provider  Lujean Amel MD      Encounter Date: 05/14/2019  Check In: Session Check In - 05/14/19 0850      Check-In   Location  [facility]-Cardiac & Pulmonary Rehab    Staff Present  Heath Lark, RN, BSN, CCRP;Jessica Quincy, MA, RCEP, CCRP, [place], BS, ACSM CEP, Exercise Physiologist;Joseph Cutten RCP,RRT,BSRT    Virtual Visit  No    Medication changes reported      No    Fall or balance concerns reported     No    Warm-up and Cool-down  Performed on first and last piece of equipment    Resistance Training Performed  Yes    VAD Patient?  No    PAD/SET Patient?  No      Pain Assessment   Currently in Pain?  No/denies          Social History   Tobacco Use  Smoking Status Former Smoker  . Packs/day: 1.00  . Years: 15.00  . Pack years: 15.00  . Types: Cigarettes  . Quit date: 05/03/1978  . Years since quitting: 41.0  Smokeless Tobacco Former Systems developer  Tobacco Comment   started smoking at age 68    Goals Met:  Independence with exercise equipment Exercise tolerated well No report of cardiac concerns or symptoms  Goals Unmet:  Not Applicable  Comments: Pt able to follow exercise prescription today without complaint.  Will continue to monitor for progression.    Dr. Emily Filbert is Medical Director for [place] [facility].

## 2019-05-16 ENCOUNTER — Encounter (HOSPITAL_BASED_OUTPATIENT_CLINIC_OR_DEPARTMENT_OTHER): Payer: Commercial Managed Care - PPO | Admitting: Pulmonary Disease

## 2019-05-16 ENCOUNTER — Telehealth: Payer: Self-pay | Admitting: Pulmonary Disease

## 2019-05-16 DIAGNOSIS — G4733 Obstructive sleep apnea (adult) (pediatric): Secondary | ICD-10-CM

## 2019-05-16 NOTE — Telephone Encounter (Signed)
[facility] closes at 5:00. Will try again 05/17/19 in AM.

## 2019-05-16 NOTE — Telephone Encounter (Signed)
Patient has hx of COPD, OSA, OHS, and chronic respiratory failure.  He uses Bipap with 2 liters oxygen.  He needs new Bipap machine.  Received message from his DME stating he needs new sleep study prior to getting new Bipap machine.  Order placed for split night sleep study.  Selinda Eon from [employer] (phone [phone], fax [phone]) questioning whether patient needs Bipap study with overnight oximetry.   Please contact his DME to determine which type of sleep study is required by his insurance, and then place appropriate order.

## 2019-05-17 ENCOUNTER — Telehealth: Payer: Self-pay | Admitting: General Surgery

## 2019-05-17 NOTE — Telephone Encounter (Signed)
Spoke with Sharyn Lull [phone], option #4).  She stated based on his insurance a CPAP titration, bi-level titration or a split night study are covered.  She also mentioned that the visit notes indicate BIPAP. So if anything other than a split night study are needed, previous sleep study would be required for documentation.

## 2019-05-21 ENCOUNTER — Telehealth (INDEPENDENT_AMBULATORY_CARE_PROVIDER_SITE_OTHER): Payer: Self-pay

## 2019-05-21 NOTE — Telephone Encounter (Signed)
Spoke with the patient and he is now scheduled with Dr. Lucky Cowboy for a permcath insertion on 05/24/19 with a 8:15 am arrival time to the [facility]. Patient will do covid testing on 05/22/19 at the [facility]. Pre-procedure instructions were discussed with the patient. Patient stated he understood.

## 2019-05-22 ENCOUNTER — Other Ambulatory Visit: Payer: Commercial Managed Care - PPO

## 2019-05-22 ENCOUNTER — Other Ambulatory Visit
Admission: RE | Admit: 2019-05-22 | Discharge: 2019-05-22 | Disposition: A | Discharge status: Home / Self Care | Payer: Commercial Managed Care - PPO | Source: Ambulatory Visit | Attending: Vascular Surgery | Admitting: Vascular Surgery

## 2019-05-22 ENCOUNTER — Other Ambulatory Visit: Payer: Self-pay

## 2019-05-22 DIAGNOSIS — Z20822 Contact with and (suspected) exposure to covid-19: Secondary | ICD-10-CM | POA: Diagnosis not present

## 2019-05-22 DIAGNOSIS — Z01812 Encounter for preprocedural laboratory examination: Secondary | ICD-10-CM | POA: Diagnosis not present

## 2019-05-22 LAB — SARS CORONAVIRUS 2 (TAT 6-24 HRS): SARS Coronavirus 2: NEGATIVE

## 2019-05-22 NOTE — Telephone Encounter (Signed)
Called Patrick Hurst with [employer] and advised of Dr. Juanetta Gosling recommendation for BIPAP titration study. She stated she will be able to work off the verbal given over the phone.  Sleep study information from 2015 printed. Faxed to Patrick Hurst's attention at [place]. [phone]. Nothing further needed.

## 2019-05-22 NOTE — Telephone Encounter (Signed)
PSG 05/11/13 >> AHI 140, SpO2 low 41.6%   Please let [facility] know he had diagnostic sleep study from 05/11/13 (this can be found in media section for his Epic chart).  As such he would need to have a Bipap titration study. Please place order for this.

## 2019-05-23 ENCOUNTER — Other Ambulatory Visit (INDEPENDENT_AMBULATORY_CARE_PROVIDER_SITE_OTHER): Payer: Self-pay | Admitting: Nurse Practitioner

## 2019-05-23 MED ORDER — CEFAZOLIN SODIUM-DEXTROSE 1-4 GM/50ML-% IV SOLN
1.0000 g | Freq: Once | INTRAVENOUS | Status: AC
  Administered 2019-05-24: 10:00:00 1 g via INTRAVENOUS

## 2019-05-24 ENCOUNTER — Encounter: Payer: Self-pay | Admitting: Vascular Surgery

## 2019-05-24 ENCOUNTER — Other Ambulatory Visit: Payer: Self-pay

## 2019-05-24 ENCOUNTER — Encounter
Admission: RE | Disposition: A | Discharge status: Home / Self Care | Payer: Self-pay | Source: Home / Self Care | Attending: Vascular Surgery

## 2019-05-24 ENCOUNTER — Ambulatory Visit
Admission: RE | Admit: 2019-05-24 | Discharge: 2019-05-24 | Disposition: A | Discharge status: Home / Self Care | Payer: Commercial Managed Care - PPO | Attending: Vascular Surgery | Admitting: Vascular Surgery

## 2019-05-24 DIAGNOSIS — Z87891 Personal history of nicotine dependence: Secondary | ICD-10-CM | POA: Insufficient documentation

## 2019-05-24 DIAGNOSIS — E875 Hyperkalemia: Secondary | ICD-10-CM | POA: Diagnosis not present

## 2019-05-24 DIAGNOSIS — R609 Edema, unspecified: Secondary | ICD-10-CM | POA: Insufficient documentation

## 2019-05-24 DIAGNOSIS — Z7901 Long term (current) use of anticoagulants: Secondary | ICD-10-CM | POA: Diagnosis not present

## 2019-05-24 DIAGNOSIS — E785 Hyperlipidemia, unspecified: Secondary | ICD-10-CM | POA: Insufficient documentation

## 2019-05-24 DIAGNOSIS — G4733 Obstructive sleep apnea (adult) (pediatric): Secondary | ICD-10-CM | POA: Diagnosis not present

## 2019-05-24 DIAGNOSIS — N2581 Secondary hyperparathyroidism of renal origin: Secondary | ICD-10-CM | POA: Insufficient documentation

## 2019-05-24 DIAGNOSIS — K7460 Unspecified cirrhosis of liver: Secondary | ICD-10-CM | POA: Insufficient documentation

## 2019-05-24 DIAGNOSIS — Z79899 Other long term (current) drug therapy: Secondary | ICD-10-CM | POA: Insufficient documentation

## 2019-05-24 DIAGNOSIS — N184 Chronic kidney disease, stage 4 (severe): Secondary | ICD-10-CM | POA: Insufficient documentation

## 2019-05-24 DIAGNOSIS — R809 Proteinuria, unspecified: Secondary | ICD-10-CM | POA: Insufficient documentation

## 2019-05-24 DIAGNOSIS — E039 Hypothyroidism, unspecified: Secondary | ICD-10-CM | POA: Diagnosis not present

## 2019-05-24 DIAGNOSIS — Z794 Long term (current) use of insulin: Secondary | ICD-10-CM | POA: Diagnosis not present

## 2019-05-24 DIAGNOSIS — E1140 Type 2 diabetes mellitus with diabetic neuropathy, unspecified: Secondary | ICD-10-CM | POA: Insufficient documentation

## 2019-05-24 DIAGNOSIS — E11319 Type 2 diabetes mellitus with unspecified diabetic retinopathy without macular edema: Secondary | ICD-10-CM | POA: Diagnosis not present

## 2019-05-24 DIAGNOSIS — N186 End stage renal disease: Secondary | ICD-10-CM

## 2019-05-24 DIAGNOSIS — E1122 Type 2 diabetes mellitus with diabetic chronic kidney disease: Secondary | ICD-10-CM | POA: Insufficient documentation

## 2019-05-24 DIAGNOSIS — Z79890 Hormone replacement therapy (postmenopausal): Secondary | ICD-10-CM | POA: Insufficient documentation

## 2019-05-24 DIAGNOSIS — Z6841 Body Mass Index (BMI) 40.0 and over, adult: Secondary | ICD-10-CM | POA: Diagnosis not present

## 2019-05-24 DIAGNOSIS — I129 Hypertensive chronic kidney disease with stage 1 through stage 4 chronic kidney disease, or unspecified chronic kidney disease: Secondary | ICD-10-CM | POA: Diagnosis not present

## 2019-05-24 DIAGNOSIS — N185 Chronic kidney disease, stage 5: Secondary | ICD-10-CM

## 2019-05-24 HISTORY — PX: DIALYSIS/PERMA CATHETER INSERTION: CATH118288

## 2019-05-24 LAB — POTASSIUM (ARMC VASCULAR LAB ONLY): Potassium (ARMC vascular lab): 4.3 (ref 3.5–5.1)

## 2019-05-24 LAB — GLUCOSE, CAPILLARY: Glucose-Capillary: 90 mg/dL (ref 70–99)

## 2019-05-24 SURGERY — DIALYSIS/PERMA CATHETER INSERTION
Anesthesia: Moderate Sedation

## 2019-05-24 MED ORDER — MIDAZOLAM HCL 2 MG/2ML IJ SOLN
INTRAMUSCULAR | Status: DC | PRN
  Administered 2019-05-24: 2 mg via INTRAVENOUS
  Administered 2019-05-24: 0.5 mg via INTRAVENOUS
  Administered 2019-05-24: 2 mg via INTRAVENOUS

## 2019-05-24 MED ORDER — SODIUM CHLORIDE 0.9 % IV SOLN: INTRAVENOUS | Status: DC

## 2019-05-24 MED ORDER — DIPHENHYDRAMINE HCL 50 MG/ML IJ SOLN: 50.0000 mg | Freq: Once | INTRAMUSCULAR | Status: DC | PRN

## 2019-05-24 MED ORDER — MIDAZOLAM HCL 2 MG/2ML IJ SOLN
INTRAMUSCULAR | Status: AC
  Filled 2019-05-24: qty 4

## 2019-05-24 MED ORDER — METHYLPREDNISOLONE SODIUM SUCC 125 MG IJ SOLR: 125.0000 mg | Freq: Once | INTRAMUSCULAR | Status: DC | PRN

## 2019-05-24 MED ORDER — HEPARIN SODIUM (PORCINE) 10000 UNIT/ML IJ SOLN
INTRAMUSCULAR | Status: AC
  Filled 2019-05-24: qty 1

## 2019-05-24 MED ORDER — FENTANYL CITRATE (PF) 100 MCG/2ML IJ SOLN
INTRAMUSCULAR | Status: AC
  Filled 2019-05-24: qty 2

## 2019-05-24 MED ORDER — FAMOTIDINE 20 MG PO TABS: 40.0000 mg | ORAL_TABLET | Freq: Once | ORAL | Status: DC | PRN

## 2019-05-24 MED ORDER — MIDAZOLAM HCL 2 MG/2ML IJ SOLN
INTRAMUSCULAR | Status: AC
  Filled 2019-05-24: qty 2

## 2019-05-24 MED ORDER — FENTANYL CITRATE (PF) 100 MCG/2ML IJ SOLN
INTRAMUSCULAR | Status: DC | PRN
  Administered 2019-05-24 (×2): 50 ug via INTRAVENOUS
  Administered 2019-05-24: 25 ug via INTRAVENOUS

## 2019-05-24 MED ORDER — MIDAZOLAM HCL 2 MG/ML PO SYRP: 8.0000 mg | ORAL_SOLUTION | Freq: Once | ORAL | Status: DC | PRN

## 2019-05-24 MED ORDER — ONDANSETRON HCL 4 MG/2ML IJ SOLN: 4.0000 mg | Freq: Four times a day (QID) | INTRAMUSCULAR | Status: DC | PRN

## 2019-05-24 MED ORDER — HYDROMORPHONE HCL 1 MG/ML IJ SOLN: 1.0000 mg | Freq: Once | INTRAMUSCULAR | Status: DC | PRN

## 2019-05-24 SURGICAL SUPPLY — 9 items
ADH SKN CLS APL DERMABOND .7 (GAUZE/BANDAGES/DRESSINGS) ×1
CATH PALINDROME RT-P 15FX19CM (CATHETERS) IMPLANT
CATH PALINDROME RT-P 15FX23CM (CATHETERS) ×1 IMPLANT
COVER PROBE U/S 5X48 (MISCELLANEOUS) ×1 IMPLANT
DERMABOND ADVANCED (GAUZE/BANDAGES/DRESSINGS) ×1
DERMABOND ADVANCED .7 DNX12 (GAUZE/BANDAGES/DRESSINGS) IMPLANT
PACK ANGIOGRAPHY (CUSTOM PROCEDURE TRAY) ×1 IMPLANT
SUT MNCRL AB 4-0 PS2 18 (SUTURE) ×1 IMPLANT
SUT PROLENE 0 CT 1 30 (SUTURE) ×1 IMPLANT

## 2019-05-24 NOTE — H&P (Signed)
[facility] History & Physical Update  The patient was interviewed and re-examined.  The patient's previous History and Physical has been reviewed and is unchanged.  There is no change in the plan of care. We plan to proceed with the scheduled procedure.  Leotis Pain, MD  05/24/2019, 8:07 AM

## 2019-05-24 NOTE — Op Note (Signed)
OPERATIVE NOTE    PRE-OPERATIVE DIAGNOSIS: 1. ESRD   POST-OPERATIVE DIAGNOSIS: same as above  PROCEDURE: 1. Ultrasound guidance for vascular access to the right internal jugular vein 2. Fluoroscopic guidance for placement of catheter 3. Placement of a 23 cm tip to cuff tunneled hemodialysis catheter via the right internal jugular vein  SURGEON: Leotis Pain, MD  ANESTHESIA:  Local with Moderate conscious sedation for approximately 15 minutes using 4.5 mg of Versed and 125 mcg of Fentanyl  ESTIMATED BLOOD LOSS: 10 cc  FLUORO TIME: less than one minute  CONTRAST: none  FINDING(S): 1.  Patent right internal jugular vein  SPECIMEN(S):  None  INDICATIONS:   Patrick Hurst is a 64 y.o.male who presents with renal failure.  The patient needs long term dialysis access for their ESRD, and a Permcath is necessary.  Risks and benefits are discussed and informed consent is obtained.    DESCRIPTION: After obtaining full informed written consent, the patient was brought back to the vascular suited. The patient's right neck and chest were sterilely prepped and draped in a sterile surgical field was created. Moderate conscious sedation was administered during a face to face encounter with the patient throughout the procedure with my supervision of the RN administering medicines and monitoring the patient's vital signs, pulse oximetry, telemetry and mental status throughout from the start of the procedure until the patient was taken to the recovery room.  The right internal jugular vein was visualized with ultrasound and found to be patent. It was then accessed under direct ultrasound guidance and a permanent image was recorded. A wire was placed. After skin nick and dilatation, the peel-away sheath was placed over the wire. I then turned my attention to an area under the clavicle. Approximately 1-2 fingerbreadths below the clavicle a small counterincision was created and tunneled from the  subclavicular incision to the access site. Using fluoroscopic guidance, a 23 centimeter tip to cuff tunneled hemodialysis catheter was selected, and tunneled from the subclavicular incision to the access site. It was then placed through the peel-away sheath and the peel-away sheath was removed. Using fluoroscopic guidance the catheter tips were parked in the right atrium. The appropriate distal connectors were placed. It withdrew blood well and flushed easily with heparinized saline and a concentrated heparin solution was then placed. It was secured to the chest wall with 2 Prolene sutures. The access incision was closed single 4-0 Monocryl. A 4-0 Monocryl pursestring suture was placed around the exit site. Sterile dressings were placed. The patient tolerated the procedure well and was taken to the recovery room in stable condition.  COMPLICATIONS: None  CONDITION: Stable  Leotis Pain, MD 05/24/2019 10:43 AM   This note was created with Dragon Medical transcription system. Any errors in dictation are purely unintentional.

## 2019-05-24 NOTE — Discharge Instructions (Signed)
Central Line Dialysis Access Placement, Care After This sheet gives you information about how to care for yourself after your procedure. Your health care provider may also give you more specific instructions. If you have problems or questions, contact your health care provider. What can I expect after the procedure? After the procedure, it is common to have:  Mild pain or discomfort.  Mild redness, swelling, or bruising around your incision.  A small amount of blood or clear fluid coming from your incision. Follow these instructions at home: Incision care   Follow instructions from your health care provider about how to take care of your incision. Make sure you: ? Wash your hands with soap and water before you change your bandage (dressing). If soap and water are not available, use hand sanitizer. ? Change your dressing as told by your health care provider. ? Leave stitches (sutures) in place.  Check your incision area every day for signs of infection. Check for: ? More redness, swelling, or pain. ? More fluid or blood. ? Warmth. ? Pus or a bad smell.  If directed, put heat on the catheter site as often as told by your health care provider. Use the heat source that your health care provider recommends, such as a moist heat pack or a heating pad. ? Place a towel between your skin and the heat source. ? Leave the heat on for 20-30 minutes. ? Remove the heat if your skin turns bright red. This is especially important if you are unable to feel pain, heat, or cold. You may have a greater risk of getting burned.  If directed, put ice on the catheter site: ? Put ice in a plastic bag. ? Place a towel between your skin and the bag. ? Leave the ice on for 20 minutes, 2-3 times a day. Medicines  Take over-the-counter and prescription medicines only as told by your health care provider.  If you were prescribed an antibiotic medicine, use it as told by your health care provider. Do not stop  using the antibiotic even if you start to feel better. Activity  Return to your normal activities as told by your health care provider. Ask your health care provider what activities are safe for you.  Do not lift anything that is heavier than 10 lb (4.5 kg) until your health care provider says that this is safe. Driving  Do not drive for 24 hours if you were given a medicine to help you relax (sedative) during your procedure.  Do not drive or use heavy machinery while taking prescription pain medicine. Lifestyle  Limit alcohol intake to no more than 1 drink a day for nonpregnant women and 2 drinks a day for men. One drink equals 12 oz of beer, 5 oz of wine, or 1 oz of hard liquor.  Do not use any products that contain nicotine or tobacco, such as cigarettes and e-cigarettes. If you need help quitting, ask your health care provider. General instructions  Do not take baths or showers, swim, or use a hot tub until your health care provider approves. You may only be allowed to take sponge baths for bathing.  Wear compression stockings as told by your health care provider. These stockings help to prevent blood clots and reduce swelling in your legs.  Follow instructions from your health care provider about eating or drinking restrictions.  Keep all follow-up visits as told by your health care provider. This is important. Contact a health care provider if:  Your  catheter gets pulled out of place.  Your catheter site becomes itchy.  You develop a rash around your catheter site.  You have more redness, swelling, or pain around your incision.  You have more fluid or blood coming from your incision.  Your incision area feels warm to the touch.  You have pus or a bad smell coming from your incision.  You have a fever. Get help right away if:  You become light-headed or dizzy.  You faint.  You have difficulty breathing.  Your catheter gets pulled out completely. This  information is not intended to replace advice given to you by your health care provider. Make sure you discuss any questions you have with your health care provider. Document Revised: 04/01/2017 Document Reviewed: 01/12/2016 Elsevier Patient Education  2020 [employer].

## 2019-05-28 DIAGNOSIS — G4733 Obstructive sleep apnea (adult) (pediatric): Secondary | ICD-10-CM

## 2019-05-29 ENCOUNTER — Telehealth: Payer: Self-pay | Admitting: *Deleted

## 2019-05-29 NOTE — Telephone Encounter (Signed)
Patrick Hurst is recovering from his dialysis catheter placement.  He had his first treatment yesterday and is feeling good.  He is going to talk to his doctor about returning to class.  He also mentioned that they are planning to put in port for home based dialysis, but he was not sure when.

## 2019-05-30 ENCOUNTER — Encounter: Payer: Self-pay | Admitting: *Deleted

## 2019-05-30 DIAGNOSIS — G4733 Obstructive sleep apnea (adult) (pediatric): Secondary | ICD-10-CM

## 2019-05-30 NOTE — Progress Notes (Addendum)
Pulmonary Individual Treatment Plan  Patient Details  Name: Patrick Hurst MRN: [id number] Date of Birth: May 16, 1955 Referring Provider:     Pulmonary Rehab from 03/20/2019 in [facility]monary Rehab  Referring Provider  Lujean Amel MD      Initial Encounter Date:    Pulmonary Rehab from 03/20/2019 in [facility] Cardiac and Pulmonary Rehab  Date  03/20/19      Visit Diagnosis: OSA (obstructive sleep apnea)  Patient's Home Medications on Admission:  Current Outpatient Medications:  .  albuterol (VENTOLIN HFA) 108 (90 Base) MCG/ACT inhaler, USE 2 INHALATIONS EVERY 4 HOURS AS NEEDED FOR WHEEZING OR SHORTNESS OF BREATH, Disp: 8.5 g, Rfl: 10 .  amiodarone (PACERONE) 200 MG tablet, Take 400 mg (two tabs) twice daily for 5 more days, then take 200 mg (1 tab) daily, Disp: 45 tablet, Rfl: 0 .  amitriptyline (ELAVIL) 25 MG tablet, Take 1 tablet (25 mg total) by mouth at bedtime., Disp: 90 tablet, Rfl: 3 .  amLODipine (NORVASC) 5 MG tablet, Take 1 tablet (5 mg total) by mouth daily., Disp: 30 tablet, Rfl: 3 .  Ascorbic Acid (VITAMIN C) 1000 MG tablet, Take 1,000 mg by mouth 2 (two) times daily. , Disp: , Rfl:  .  atorvastatin (LIPITOR) 40 MG tablet, TAKE 1 TABLET DAILY, Disp: 90 tablet, Rfl: 0 .  calcitRIOL (ROCALTROL) 0.5 MCG capsule, Take 0.5 mcg by mouth daily., Disp: , Rfl:  .  cetirizine (ZYRTEC) 10 MG tablet, Take 10 mg by mouth daily. , Disp: , Rfl:  .  doxycycline (VIBRA-TABS) 100 MG tablet, Take 1 tablet (100 mg total) by mouth 2 (two) times daily. (Patient not taking: Reported on 05/24/2019), Disp: 14 tablet, Rfl: 0 .  ELIQUIS 2.5 MG TABS tablet, SMARTSIG:1 Tablet(s) By Mouth Every 12 Hours, Disp: , Rfl:  .  fluticasone furoate-vilanterol (BREO ELLIPTA) 100-25 MCG/INH AEPB, Inhale 1 puff into the lungs daily., Disp: 180 each, Rfl: 3 .  hydrALAZINE (APRESOLINE) 25 MG tablet, TAKE 1 TABLET THREE TIMES A DAY, Disp: 270 tablet, Rfl: 4 .  insulin regular human CONCENTRATED (HUMULIN  R) 500 UNIT/ML injection, Inject up to 25 units twice a day, or as directed by physician, Disp: 60 mL, Rfl: 3 .  ipratropium-albuterol (DUONEB) 0.5-2.5 (3) MG/3ML SOLN, INHALE 3 ML BY NEBULIZATION EVERY 4 HOURS AS NEEDED, Disp: 360 mL, Rfl: 8 .  levothyroxine (SYNTHROID) 100 MCG tablet, Take 1 tablet (100 mcg total) by mouth daily., Disp: 90 tablet, Rfl: 3 .  metoprolol tartrate (LOPRESSOR) 50 MG tablet, Take 0.5 tablets (25 mg total) by mouth 2 (two) times daily., Disp: , Rfl:  .  Misc. Devices (PULSE OXIMETER FOR FINGER) MISC, 1 Device by Does not apply route daily as needed., Disp: 1 each, Rfl: 0 .  pantoprazole (PROTONIX) 40 MG tablet, TAKE 1 TABLET TWICE A DAY, Disp: 180 tablet, Rfl: 3 .  testosterone cypionate (DEPO-TESTOSTERONE) 200 MG/ML injection, Inject 1 mL (200 mg total) into the muscle every 14 (fourteen) days., Disp: 6 mL, Rfl: 1 .  ULORIC 80 MG TABS, Take 80 mg by mouth daily. , Disp: , Rfl:  .  vitamin B-12 (CYANOCOBALAMIN) 1000 MCG tablet, Take 1,000 mcg by mouth daily., Disp: , Rfl:   Past Medical History: Past Medical History:  Diagnosis Date  . Allergy   . Anemia   . Arthritis   . Bell's palsy   . CHF (congestive heart failure) ([place])   . CKD (chronic kidney disease)   . Depression   .  Diabetes ([place])   . Gastric ulcer   . GERD (gastroesophageal reflux disease)   . Gout   . History of chicken pox   . Hyperlipidemia   . Hypertension   . Hypothyroidism   . OSA (obstructive sleep apnea)   . Thyroid disease   . Vitamin D deficiency     Tobacco Use: Social History   Tobacco Use  Smoking Status Former Smoker  . Packs/day: 1.00  . Years: 15.00  . Pack years: 15.00  . Types: Cigarettes  . Quit date: 05/03/1978  . Years since quitting: 41.1  Smokeless Tobacco Former Systems developer  Tobacco Comment   started smoking at age 27    Labs: Recent Review [facility] Pul[facility] Re[facility] Latest Ref Rng & Units 11/18/2017 02/20/2018 06/21/2018 03/02/2019  03/03/2019   Cholestrol 100 - 199 mg/dL 125 101 - - -   LDLCALC 0 - 99 mg/dL Comment 35 - - -   HDL >39 mg/dL 20(L) 24(L) - - -   Trlycerides 0 - 149 mg/dL 449(H) 208(H) - - -   Hemoglobin A1c 4.8 - 5.6 % 5.1 5.6 - 6.1(H) -   PHART 7.350 - 7.450 - - - - 7.38   PCO2ART 32.0 - 48.0 mmHg - - - - 37   HCO3 20.0 - 28.0 mmol/L - - 24.8 - 21.9   ACIDBASEDEF 0.0 - 2.0 mmol/L - - 1.1 - 2.8(H)   O2SAT % - - 70.4 - 92.3       Pulmonary Assessment Scores: Pulmonary Assessment Scores    Row Name 03/20/19 1228         ADL UCSD   ADL Phase  Entry     SOB Score total  16     Rest  0     Walk  1     Stairs  3     Bath  0     Dress  0     Shop  1       CAT Score   CAT Score  12       mMRC Score   mMRC Score  1        UCSD: Self-administered rating of dyspnea associated with activities of daily living (ADLs) 6-point scale (0 = "not at all" to 5 = "maximal or unable to do because of breathlessness")  Scoring Scores range from 0 to 120.  Minimally important difference is 5 units  CAT: CAT can identify the health impairment of COPD patients and is better correlated with disease progression.  CAT has a scoring range of zero to 40. The CAT score is classified into four groups of low (less than 10), medium (10 - 20), high (21-30) and very high (31-40) based on the impact level of disease on health status. A CAT score over 10 suggests significant symptoms.  A worsening CAT score could be explained by an exacerbation, poor medication adherence, poor inhaler technique, or progression of COPD or comorbid conditions.  CAT MCID is 2 points  mMRC: mMRC (Modified Medical Research Council) Dyspnea Scale is used to assess the degree of baseline functional disability in patients of respiratory disease due to dyspnea. No minimal important difference is established. A decrease in score of 1 point or greater is considered a positive change.   Pulmonary Function Assessment: Pulmonary Function Assessment  - 03/19/19 1204      Breath   Shortness of Breath  Yes;Panic with Shortness of Breath;Limiting  activity       Exercise Target Goals: Exercise Program Goal: Individual exercise prescription set using results from initial 6 min walk test and THRR while considering  patient's activity barriers and safety.   Exercise Prescription Goal: Initial exercise prescription builds to 30-45 minutes a day of aerobic activity, 2-3 days per week.  Home exercise guidelines will be given to patient during program as part of exercise prescription that the participant will acknowledge.  Activity Barriers & Risk Stratification: Activity Barriers & Cardiac Risk Stratification - 03/20/19 1219      Activity Barriers & Cardiac Risk Stratification   Activity Barriers  Arthritis;Joint Problems;Deconditioning;Muscular Weakness;Shortness of Breath;Balance Concerns       6 Minute Walk: 6 Minute Walk    Row Name 03/20/19 1215         6 Minute Walk   Phase  Initial     Distance  1060 feet     Walk Time  6 minutes     # of Rest Breaks  0     MPH  2.08     METS  2.72     RPE  8     Perceived Dyspnea   1     VO2 Peak  9.51     Symptoms  Yes (comment)     Comments  knee pain 5/10     Resting HR  67 bpm     Resting BP  152/74     Resting Oxygen Saturation   94 %     Exercise Oxygen Saturation  during 6 min walk  89 %     Max Ex. HR  109 bpm     Max Ex. BP  184/62     2 Minute Post BP  162/84       Interval HR   1 Minute HR  109     2 Minute HR  100     3 Minute HR  89     4 Minute HR  90     5 Minute HR  104     6 Minute HR  104     2 Minute Post HR  84     Interval Heart Rate?  Yes       Interval Oxygen   Interval Oxygen?  Yes     Baseline Oxygen Saturation %  94 %     1 Minute Oxygen Saturation %  91 %     1 Minute Liters of Oxygen  0 L Room Air     2 Minute Oxygen Saturation %  89 %     2 Minute Liters of Oxygen  0 L     3 Minute Oxygen Saturation %  89 %     3 Minute Liters of Oxygen   0 L     4 Minute Oxygen Saturation %  89 %     4 Minute Liters of Oxygen  0 L     5 Minute Oxygen Saturation %  91 %     5 Minute Liters of Oxygen  0 L     6 Minute Oxygen Saturation %  97 %     6 Minute Liters of Oxygen  0 L     2 Minute Post Oxygen Saturation %  96 %     2 Minute Post Liters of Oxygen  0 L       Oxygen Initial Assessment: Oxygen Initial Assessment - 03/19/19 1202  Home Oxygen   Home Oxygen Device  Home Concentrator    Sleep Oxygen Prescription  BiPAP    Liters per minute  2    Home Exercise Oxygen Prescription  None    Home at Rest Exercise Oxygen Prescription  None    Compliance with Home Oxygen Use  Yes      Initial 6 min Walk   Oxygen Used  None      Program Oxygen Prescription   Program Oxygen Prescription  None      Intervention   Short Term Goals  To learn and exhibit compliance with exercise, home and travel O2 prescription;To learn and understand importance of monitoring SPO2 with pulse oximeter and demonstrate accurate use of the pulse oximeter.;To learn and understand importance of maintaining oxygen saturations>88%;To learn and demonstrate proper pursed lip breathing techniques or other breathing techniques.;To learn and demonstrate proper use of respiratory medications    Long  Term Goals  Exhibits compliance with exercise, home and travel O2 prescription;Verbalizes importance of monitoring SPO2 with pulse oximeter and return demonstration;Exhibits proper breathing techniques, such as pursed lip breathing or other method taught during program session;Maintenance of O2 saturations>88%;Demonstrates proper use of MDI's;Compliance with respiratory medication       Oxygen Re-Evaluation: Oxygen Re-Evaluation    Row Name 04/11/19 0844             Program Oxygen Prescription   Program Oxygen Prescription  None         Home Oxygen   Home Oxygen Device  Home Concentrator       Sleep Oxygen Prescription  BiPAP       Liters per minute  2        Home Exercise Oxygen Prescription  None       Home at Rest Exercise Oxygen Prescription  None       Compliance with Home Oxygen Use  Yes         Goals/Expected Outcomes   Short Term Goals  To learn and exhibit compliance with exercise, home and travel O2 prescription;To learn and understand importance of monitoring SPO2 with pulse oximeter and demonstrate accurate use of the pulse oximeter.;To learn and understand importance of maintaining oxygen saturations>88%;To learn and demonstrate proper pursed lip breathing techniques or other breathing techniques.;To learn and demonstrate proper use of respiratory medications       Long  Term Goals  Exhibits compliance with exercise, home and travel O2 prescription;Verbalizes importance of monitoring SPO2 with pulse oximeter and return demonstration;Maintenance of O2 saturations>88%;Exhibits proper breathing techniques, such as pursed lip breathing or other method taught during program session;Compliance with respiratory medication;Demonstrates proper use of MDI's       Comments  Practiced PLB with patient. Patient understands why PLB is important and to use it when he is short of breath. Reviewed that oxygen saturations should be 88 percent and above. Patient has a pulse oximeter at home to check his oxygen.       Goals/Expected Outcomes  Short: use PLB with exertion. Long: use PLB on exertion proficiently and independently.          Oxygen Discharge (Final Oxygen Re-Evaluation): Oxygen Re-Evaluation - 04/11/19 0844      Program Oxygen Prescription   Program Oxygen Prescription  None      Home Oxygen   Home Oxygen Device  Home Concentrator    Sleep Oxygen Prescription  BiPAP    Liters per minute  2    Home Exercise Oxygen Prescription  None    Home at Rest Exercise Oxygen Prescription  None    Compliance with Home Oxygen Use  Yes      Goals/Expected Outcomes   Short Term Goals  To learn and exhibit compliance with exercise, home and travel O2  prescription;To learn and understand importance of monitoring SPO2 with pulse oximeter and demonstrate accurate use of the pulse oximeter.;To learn and understand importance of maintaining oxygen saturations>88%;To learn and demonstrate proper pursed lip breathing techniques or other breathing techniques.;To learn and demonstrate proper use of respiratory medications    Long  Term Goals  Exhibits compliance with exercise, home and travel O2 prescription;Verbalizes importance of monitoring SPO2 with pulse oximeter and return demonstration;Maintenance of O2 saturations>88%;Exhibits proper breathing techniques, such as pursed lip breathing or other method taught during program session;Compliance with respiratory medication;Demonstrates proper use of MDI's    Comments  Practiced PLB with patient. Patient understands why PLB is important and to use it when he is short of breath. Reviewed that oxygen saturations should be 88 percent and above. Patient has a pulse oximeter at home to check his oxygen.    Goals/Expected Outcomes  Short: use PLB with exertion. Long: use PLB on exertion proficiently and independently.       Initial Exercise Prescription: Initial Exercise Prescription - 03/20/19 1200      Date of Initial Exercise RX and Referring Provider   Date  03/20/19    Referring Provider  Lujean Amel MD      Treadmill   MPH  2    Grade  0    Minutes  15    METs  2.53      NuStep   Level  2    SPM  80    Minutes  15    METs  2.5      Recumbant Elliptical   Level  1    RPM  50    Minutes  15    METs  2.5      Prescription Details   Frequency (times per week)  3    Duration  Progress to 30 minutes of continuous aerobic without signs/symptoms of physical distress      Intensity   THRR 40-80% of Max Heartrate  104-141    Ratings of Perceived Exertion  11-13    Perceived Dyspnea  0-4      Progression   Progression  Continue to progress workloads to maintain intensity without  signs/symptoms of physical distress.      Resistance Training   Training Prescription  Yes    Weight  3 lb    Reps  10-15       Perform Capillary Blood Glucose checks as needed.  Exercise Prescription Changes:  Exercise Prescription Changes    Row Name 03/20/19 1200 03/28/19 0700 04/05/19 1100 04/18/19 1100 05/03/19 1300     Response to Exercise   Blood Pressure (Admit)  152/74  --  160/54  148/68  172/80   Blood Pressure (Exercise)  184/62  --  184/62  168/66  182/68   Blood Pressure (Exit)  162/84  --  154/58  150/66  150/78   Heart Rate (Admit)  67 bpm  --  75 bpm  103 bpm  66 bpm   Heart Rate (Exercise)  109 bpm  --  82 bpm  129 bpm  101 bpm   Heart Rate (Exit)  66 bpm  --  73 bpm  77 bpm  72 bpm  Oxygen Saturation (Admit)  94 %  --  92 %  92 %  93 %   Oxygen Saturation (Exercise)  89 %  --  92 %  90 %  89 %   Oxygen Saturation (Exit)  94 %  --  94 %  94 %  92 %   Rating of Perceived Exertion (Exercise)  8  --  '13  13  13   ' Perceived Dyspnea (Exercise)  1  --  --  1  2   Symptoms  knee pain 5/10  --  --  none  none   Comments  walk test results  --  --  --  --   Duration  --  --  Continue with 30 min of aerobic exercise without signs/symptoms of physical distress.  Continue with 30 min of aerobic exercise without signs/symptoms of physical distress.  Continue with 30 min of aerobic exercise without signs/symptoms of physical distress.   Intensity  --  --  THRR unchanged  THRR unchanged  THRR unchanged     Progression   Progression  --  --  Continue to progress workloads to maintain intensity without signs/symptoms of physical distress.  Continue to progress workloads to maintain intensity without signs/symptoms of physical distress.  Continue to progress workloads to maintain intensity without signs/symptoms of physical distress.   Average METs  --  --  2.72  2.75  2.82     Resistance Training   Training Prescription  --  --  Yes  Yes  Yes   Weight  --  --  4 lb  4 lb  4  lb   Reps  --  --  10-15  10-15  10-15     Interval Training   Interval Training  --  --  No  No  No     Treadmill   MPH  --  --  2  --  1.6   Grade  --  --  0  --  0   Minutes  --  --  15  --  15   METs  --  --  2.53  --  2.23     NuStep   Level  --  --  '3  3  3   ' SPM  --  --  80  --  80   Minutes  --  --  '15  15  15   ' METs  --  --  3  3.2  3.4     Recumbant Elliptical   Level  --  --  3  3  --   RPM  --  --  50  --  --   Minutes  --  --  15  15  --   METs  --  --  2.3  2.3  --     Home Exercise Plan   Plans to continue exercise at  --  Home (comment)  Home (comment)  Home (comment)  Home (comment)   Frequency  --  Add 1 additional day to program exercise sessions.  Add 1 additional day to program exercise sessions.  Add 1 additional day to program exercise sessions.  Add 1 additional day to program exercise sessions.   Initial Home Exercises Provided  --  03/28/19  03/28/19  03/28/19  03/28/19   Row Name 05/17/19 1600             Response to Exercise   Blood Pressure (Admit)  132/84  Blood Pressure (Exercise)  126/74       Blood Pressure (Exit)  138/74       Heart Rate (Admit)  71 bpm       Heart Rate (Exercise)  90 bpm       Heart Rate (Exit)  82 bpm       Oxygen Saturation (Admit)  92 %       Oxygen Saturation (Exercise)  85 %       Oxygen Saturation (Exit)  88 %       Rating of Perceived Exertion (Exercise)  13       Perceived Dyspnea (Exercise)  3       Symptoms  SOB       Duration  Continue with 30 min of aerobic exercise without signs/symptoms of physical distress.       Intensity  THRR unchanged         Progression   Progression  Continue to progress workloads to maintain intensity without signs/symptoms of physical distress.       Average METs  2.7         Resistance Training   Training Prescription  Yes       Weight  3 lb       Reps  10-15         Interval Training   Interval Training  No         NuStep   Level  3       Minutes  15        METs  3.1         Recumbant Elliptical   Level  3       Minutes  15       METs  2.3         Home Exercise Plan   Plans to continue exercise at  Home (comment)       Frequency  Add 1 additional day to program exercise sessions.       Initial Home Exercises Provided  03/28/19          Exercise Comments:  Exercise Comments    Row Name 03/21/19 0746 03/26/19 0741 03/28/19 0736 04/20/19 0915 04/20/19 0935   Exercise Comments  First full day of exercise!  Patient was oriented to gym and equipment including functions, settings, policies, and procedures.  Patient's individual exercise prescription and treatment plan were reviewed.  All starting workloads were established based on the results of the 6 minute walk test done at initial orientation visit.  The plan for exercise progression was also introduced and progression will be customized based on patient's performance and goals.  Weight up 2 pounds. Fluid pill stopped last week.  Has appt with MD on Wed this week.  He will call MD if weight continues to increase.  Weight up. Has appt with Kidney MD today and plans to talk about weight gain.Had called office and no response as of this AM  Noticed weight has been rising since admission. Ayomikun has appointment at [facility] today and has been given his exercise Log to share all session weights.  Note From [facility] Hack[facility] today FYI: Dr. [name] (nephrology) had stopped his torsemide due to worsening GFR. Labs were checked on 12/16 but results aren't in. In Singh's note, he said patient could take torsemide as needed but he was waiting on the lab results first. I advised the patient to call Singh's officer later today if he hasn't heard  anything so he would know if he could resume the torsemide.      Exercise Goals and Review:  Exercise Goals    Row Name 03/20/19 1222             Exercise Goals   Increase Physical Activity  Yes       Intervention  Provide advice, education,  support and counseling about physical activity/exercise needs.;Develop an individualized exercise prescription for aerobic and resistive training based on initial evaluation findings, risk stratification, comorbidities and participant's personal goals.       Expected Outcomes  Short Term: Attend rehab on a regular basis to increase amount of physical activity.;Long Term: Add in home exercise to make exercise part of routine and to increase amount of physical activity.;Long Term: Exercising regularly at least 3-5 days a week.       Increase Strength and Stamina  Yes       Intervention  Provide advice, education, support and counseling about physical activity/exercise needs.;Develop an individualized exercise prescription for aerobic and resistive training based on initial evaluation findings, risk stratification, comorbidities and participant's personal goals.       Expected Outcomes  Short Term: Increase workloads from initial exercise prescription for resistance, speed, and METs.;Short Term: Perform resistance training exercises routinely during rehab and add in resistance training at home;Long Term: Improve cardiorespiratory fitness, muscular endurance and strength as measured by increased METs and functional capacity (6MWT)       Able to understand and use rate of perceived exertion (RPE) scale  Yes       Intervention  Provide education and explanation on how to use RPE scale       Expected Outcomes  Short Term: Able to use RPE daily in rehab to express subjective intensity level;Long Term:  Able to use RPE to guide intensity level when exercising independently       Knowledge and understanding of Target Heart Rate Range (THRR)  Yes       Intervention  Provide education and explanation of THRR including how the numbers were predicted and where they are located for reference       Expected Outcomes  Short Term: Able to state/look up THRR;Short Term: Able to use daily as guideline for intensity in  rehab;Long Term: Able to use THRR to govern intensity when exercising independently       Able to check pulse independently  Yes       Intervention  Provide education and demonstration on how to check pulse in carotid and radial arteries.;Review the importance of being able to check your own pulse for safety during independent exercise       Expected Outcomes  Short Term: Able to explain why pulse checking is important during independent exercise;Long Term: Able to check pulse independently and accurately       Understanding of Exercise Prescription  Yes       Intervention  Provide education, explanation, and written materials on patient's individual exercise prescription       Expected Outcomes  Short Term: Able to explain program exercise prescription;Long Term: Able to explain home exercise prescription to exercise independently          Exercise Goals Re-Evaluation : Exercise Goals Re-Evaluation    Row Name 03/21/19 0746 03/28/19 0753 04/05/19 1207 04/18/19 1117 05/03/19 1306     Exercise Goal Re-Evaluation   Exercise Goals Review  Increase Physical Activity;Able to understand and use rate of perceived exertion (RPE) scale;Knowledge and understanding of  Target Heart Rate Range (THRR);Understanding of Exercise Prescription;Able to understand and use Dyspnea scale  Increase Physical Activity;Increase Strength and Stamina;Able to understand and use rate of perceived exertion (RPE) scale;Able to understand and use Dyspnea scale;Knowledge and understanding of Target Heart Rate Range (THRR);Able to check pulse independently;Understanding of Exercise Prescription  Increase Physical Activity;Increase Strength and Stamina;Able to understand and use rate of perceived exertion (RPE) scale;Able to understand and use Dyspnea scale;Knowledge and understanding of Target Heart Rate Range (THRR);Able to check pulse independently;Understanding of Exercise Prescription  Increase Physical Activity;Increase Strength  and Stamina;Understanding of Exercise Prescription  Increase Physical Activity;Increase Strength and Stamina;Able to understand and use rate of perceived exertion (RPE) scale;Able to understand and use Dyspnea scale;Knowledge and understanding of Target Heart Rate Range (THRR);Able to check pulse independently;Understanding of Exercise Prescription   Comments  Reviewed RPE scale, THR and program prescription with pt today.  Pt voiced understanding and was given a copy of goals to take home.  Reviewed home exercise with pt today.  Pt plans to use TM at home for exercise.  Reviewed THR, pulse, RPE, sign and symptoms, NTG use, and when to call 911 or MD.  Also discussed weather considerations and indoor options.  Pt voiced understanding.  Hurshell is doing well with exercise.  He has increased levels on most machines and his oxygen stays above 90.  He has increased to 4 lb weights  Sabatino continues to do well in rehab.  He is already up to level 3 on the NuStep and XR.  We will continue to monitor his progress.  Enrrique is trying the TM.  It is hard for him and he is working towards being able to walk for longer duration.   Expected Outcomes  Short: Use RPE daily to regulate intensity. Long: Follow program prescription in THR.  Short - unpack TM at home to get ready to use Long - exercise at home on days not at [facility] [place] - attend LW consistently Long - increase stamina overall  Short: Continue to improve workloads.  Long: Continue to improve stamina.  Short - continue to increase time on TM Long : walk 20 min on TM without stopping   Row Name 05/17/19 1638 05/29/19 1103           Exercise Goal Re-Evaluation   Exercise Goals Review  Increase Physical Activity;Increase Strength and Stamina;Understanding of Exercise Prescription  --      Comments  Eithen has been doing well in rehab.  This week his sats were running in the high 80s for exercise.  Last week he had had a rough week and was having blood pressure issues.   We will continue to montior his progress.  Out since last review.      Expected Outcomes  Short: Continue to work on Personal assistant for exericse.  Long: Continue to improve stamina for walking on treadmill.  --         Discharge Exercise Prescription (Final Exercise Prescription Changes): Exercise Prescription Changes - 05/17/19 1600      Response to Exercise   Blood Pressure (Admit)  132/84    Blood Pressure (Exercise)  126/74    Blood Pressure (Exit)  138/74    Heart Rate (Admit)  71 bpm    Heart Rate (Exercise)  90 bpm    Heart Rate (Exit)  82 bpm    Oxygen Saturation (Admit)  92 %    Oxygen Saturation (Exercise)  85 %    Oxygen  Saturation (Exit)  88 %    Rating of Perceived Exertion (Exercise)  13    Perceived Dyspnea (Exercise)  3    Symptoms  SOB    Duration  Continue with 30 min of aerobic exercise without signs/symptoms of physical distress.    Intensity  THRR unchanged      Progression   Progression  Continue to progress workloads to maintain intensity without signs/symptoms of physical distress.    Average METs  2.7      Resistance Training   Training Prescription  Yes    Weight  3 lb    Reps  10-15      Interval Training   Interval Training  No      NuStep   Level  3    Minutes  15    METs  3.1      Recumbant Elliptical   Level  3    Minutes  15    METs  2.3      Home Exercise Plan   Plans to continue exercise at  Home (comment)    Frequency  Add 1 additional day to program exercise sessions.    Initial Home Exercises Provided  03/28/19       Nutrition:  Target Goals: Understanding of nutrition guidelines, daily intake of sodium <1544m, cholesterol <2062m calories 30% from fat and 7% or less from saturated fats, daily to have 5 or more servings of fruits and vegetables.  Biometrics: Pre Biometrics - 03/20/19 1225      Pre Biometrics   Height  5' 10.6" (1.793 m)    Weight  278 lb 9.6 oz (126.4 kg)    BMI (Calculated)  39.31    Single Leg  Stand  1.31 seconds        Nutrition Therapy Plan and Nutrition Goals: Nutrition Therapy & Goals - 04/03/19 0914      Nutrition Therapy   Diet  Renal, HH, DM, Low Na diet    Drug/Food Interactions  Purine/Gout    Protein (specify units)  100g    Fiber  30 grams    Whole Grain Foods  3 servings    Saturated Fats  12 max. grams    Fruits and Vegetables  5 servings/day    Sodium  1.5 grams      Personal Nutrition Goals   Nutrition Goal  ST: continue with current changes LT: Lose weight    Comments  Not on dialysis. Renal doctor 2-3 months (blood work 1x/month): pt reports improved kidney function at 13%; pt reports that renal MD stated that if he says at 13% he does not need dialysis.  A1c 6.1%. 4-6 oz of coffee. Lunch 12 oz of water. Afternoon 16 oz. Dinner [id number]: 2 packets sugar free oatmeal w/ cup of fruit. L: sometimes: sandwich (chicken salad) with cup of fruit. D: meat w/ two vegetables (chicken baked in oven) variety of vegetables. Uses airfyer. Discussed DM, renal, HH eating. Discussed set point weight range and health outcomes. Pt would not like to make any goals at this time. Pt reports edema with some pitting, not on fluid pill.      Intervention Plan   Intervention  Prescribe, educate and counsel regarding individualized specific dietary modifications aiming towards targeted core components such as weight, hypertension, lipid management, diabetes, heart failure and other comorbidities.;Nutrition handout(s) given to patient.    Expected Outcomes  Short Term Goal: Understand basic principles of dietary content, such as calories, fat, sodium, cholesterol  and nutrients.;Short Term Goal: A plan has been developed with personal nutrition goals set during dietitian appointment.;Long Term Goal: Adherence to prescribed nutrition plan.       Nutrition Assessments: Nutrition Assessments - 03/20/19 1226      MEDFICTS Scores   Pre Score  60       Nutrition Goals  Re-Evaluation: Nutrition Goals Re-Evaluation    Row Name 05/10/19 0822             Goals   Nutrition Goal  ST: continue with current changes LT: Lose weight       Comment  Pt reports no concerns at this current time, would like to continue with what he is doing.       Expected Outcome  ST: continue with current changes LT: Lose weight          Nutrition Goals Discharge (Final Nutrition Goals Re-Evaluation): Nutrition Goals Re-Evaluation - 05/10/19 9150      Goals   Nutrition Goal  ST: continue with current changes LT: Lose weight    Comment  Pt reports no concerns at this current time, would like to continue with what he is doing.    Expected Outcome  ST: continue with current changes LT: Lose weight       Psychosocial: Target Goals: Acknowledge presence or absence of significant depression and/or stress, maximize coping skills, provide positive support system. Participant is able to verbalize types and ability to use techniques and skills needed for reducing stress and depression.   Initial Review & Psychosocial Screening: Initial Psych Review & Screening - 03/19/19 1204      Initial Review   Current issues with  None Identified      Family Dynamics   Good Support System?  Yes    Comments  He can look to his wife, two sons and grand children for supprort. He states he has a positive outlook on life in genral.      Barriers   Psychosocial barriers to participate in program  The patient should benefit from training in stress management and relaxation.      Screening Interventions   Interventions  To provide support and resources with identified psychosocial needs;Provide feedback about the scores to participant;Encouraged to exercise    Expected Outcomes  Short Term goal: Identification and review with participant of any Quality of Life or Depression concerns found by scoring the questionnaire.;Short Term goal: Utilizing psychosocial counselor, staff and physician to assist  with identification of specific Stressors or current issues interfering with healing process. Setting desired goal for each stressor or current issue identified.;Long Term Goal: Stressors or current issues are controlled or eliminated.;Long Term goal: The participant improves quality of Life and PHQ9 Scores as seen by post scores and/or verbalization of changes       Quality of Life Scores:  Scores of 19 and below usually indicate a poorer quality of life in these areas.  A difference of  2-3 points is a clinically meaningful difference.  A difference of 2-3 points in the total score of the Quality of Life Index has been associated with significant improvement in overall quality of life, self-image, physical symptoms, and general health in studies assessing change in quality of life.  PHQ-9: Recent Review Flowsheet Data    Depression screen [facility] 2/9 04/18/2019 03/20/2019 11/18/2017 12/08/2016 02/16/2016   Decreased Interest '3 3 1 ' 0 1   Down, Depressed, Hopeless 0 0 1 0 1   PHQ - 2 Score 3 3  2 0 2   Altered sleeping 0 0 0 0 2   Tired, decreased energy '1 3 3 1 3   ' Change in appetite 1 0 0 1 2   Feeling bad or failure about yourself  0 0 0 0 1   Trouble concentrating 0 0 0 0 1   Moving slowly or fidgety/restless 0 0 0 0 1   Suicidal thoughts 0 0 0 0 0   PHQ-9 Score '5 6 5 2 12   ' Difficult doing work/chores Somewhat difficult Somewhat difficult Not difficult at all Somewhat difficult Somewhat difficult     Interpretation of Total Score  Total Score Depression Severity:  1-4 = Minimal depression, 5-9 = Mild depression, 10-14 = Moderate depression, 15-19 = Moderately severe depression, 20-27 = Severe depression   Psychosocial Evaluation and Intervention:   Psychosocial Re-Evaluation: Psychosocial Re-Evaluation    Row Name 04/11/19 [phone] 05/10/19 6160           Psychosocial Re-Evaluation   Current issues with  Current Stress Concerns  Current Stress Concerns      Comments  Patient has  shortness of breath and that can stress him out. He has a  positive attitude and is willing to make changes for his health and mental health.  Nathin came in today with a high blood pressure.  After talking with him, he has been under a lot of stress over that last couple of weeks.  His brother in law passed and they had the funeral on Tuesday.  His health and breathing have been off too.  He just has not been feeling the best.  He has also not been sleeping as well.  He is going to use exercise as a stress outlet to help with breathing and weight control.      Expected Outcomes  Short: Attend [facility] stress management education to decrease stress. Long: Maintain exercise Post [facility] to keep stress at a minimum.  Short: Continue to attend rehab as stress relief.  Long: Continue to find positive coping skills.      Interventions  Encouraged to attend Pulmonary Rehabilitation for the exercise  Encouraged to attend Pulmonary Rehabilitation for the exercise;Stress management education;Relaxation education      Continue Psychosocial Services   Follow up required by staff  Follow up required by staff         Psychosocial Discharge (Final Psychosocial Re-Evaluation): Psychosocial Re-Evaluation - 05/10/19 7371      Psychosocial Re-Evaluation   Current issues with  Current Stress Concerns    Comments  Holten came in today with a high blood pressure.  After talking with him, he has been under a lot of stress over that last couple of weeks.  His brother in law passed and they had the funeral on Tuesday.  His health and breathing have been off too.  He just has not been feeling the best.  He has also not been sleeping as well.  He is going to use exercise as a stress outlet to help with breathing and weight control.    Expected Outcomes  Short: Continue to attend rehab as stress relief.  Long: Continue to find positive coping skills.    Interventions  Encouraged to attend Pulmonary Rehabilitation for the  exercise;Stress management education;Relaxation education    Continue Psychosocial Services   Follow up required by staff       Education: Education Goals: Education classes will be provided on a weekly basis, covering required topics. Participant will state  understanding/return demonstration of topics presented.  Learning Barriers/Preferences: Learning Barriers/Preferences - 03/19/19 1206      Learning Barriers/Preferences   Learning Barriers  None    Learning Preferences  None       Education Topics:  Initial Evaluation Education: - Verbal, written and demonstration of respiratory meds, oximetry and breathing techniques. Instruction on use of nebulizers and MDIs and importance of monitoring MDI activations.   Pulmonary Rehab from 03/21/2019 in [facility] Cardiac and Pulmonary Rehab  Date  03/19/19  Educator  [facility]  Instruction Review Code  1- Verbalizes Understanding      General Nutrition Guidelines/Fats and Fiber: -Group instruction provided by verbal, written material, models and posters to present the general guidelines for heart healthy nutrition. Gives an explanation and review of dietary fats and fiber.   Pulmonary Rehab from 03/21/2019 in [facility] Cardiac and Pulmonary Rehab  Date  03/21/19  Educator  [facility]  Instruction Review Code  1- Verbalizes Understanding      Controlling Sodium/Reading Food Labels: -Group verbal and written material supporting the discussion of sodium use in heart healthy nutrition. Review and explanation with models, verbal and written materials for utilization of the food label.   Exercise Physiology & General Exercise Guidelines: - Group verbal and written instruction with models to review the exercise physiology of the cardiovascular system and associated critical values. Provides general exercise guidelines with specific guidelines to those with heart or lung disease.    Aerobic Exercise & Resistance Training: - Gives group verbal and written  instruction on the various components of exercise. Focuses on aerobic and resistive training programs and the benefits of this training and how to safely progress through these programs.   Flexibility, Balance, Mind/Body Relaxation: Provides group verbal/written instruction on the benefits of flexibility and balance training, including mind/body exercise modes such as yoga, pilates and tai chi.  Demonstration and skill practice provided.   Pulmonary Rehab from 03/21/2019 in [facility] Cardiac and Pulmonary Rehab  Date  03/21/19  Educator  [facility]  Instruction Review Code  1- Verbalizes Understanding      Stress and Anxiety: - Provides group verbal and written instruction about the health risks of elevated stress and causes of high stress.  Discuss the correlation between heart/lung disease and anxiety and treatment options. Review healthy ways to manage with stress and anxiety.   Depression: - Provides group verbal and written instruction on the correlation between heart/lung disease and depressed mood, treatment options, and the stigmas associated with seeking treatment.   Exercise & Equipment Safety: - Individual verbal instruction and demonstration of equipment use and safety with use of the equipment.   Pulmonary Rehab from 03/21/2019 in [facility] Cardiac and Pulmonary Rehab  Date  03/20/19  Educator  [facility]  Instruction Review Code  1- Verbalizes Understanding      Infection Prevention: - Provides verbal and written material to individual with discussion of infection control including proper hand washing and proper equipment cleaning during exercise session.   Pulmonary Rehab from 03/21/2019 in [facility] Cardiac and Pulmonary Rehab  Date  03/19/19  Educator  [place]  Instruction Review Code  1- Verbalizes Understanding      Falls Prevention: - Provides verbal and written material to individual with discussion of falls prevention and safety.   Pulmonary Rehab from 03/21/2019 in [facility] Cardiac and  Pulmonary Rehab  Date  03/19/19  Educator  [facility]  Instruction Review Code  1- Verbalizes Understanding      Diabetes: - Individual verbal and written  instruction to review signs/symptoms of diabetes, desired ranges of glucose level fasting, after meals and with exercise. Advice that pre and post exercise glucose checks will be done for 3 sessions at entry of program.   Pulmonary Rehab from 03/21/2019 in [facility] Cardiac and Pulmonary Rehab  Date  03/19/19  Educator  [facility]  Instruction Review Code  1- Verbalizes Understanding      Chronic Lung Diseases: - Group verbal and written instruction to review updates, respiratory medications, advancements in procedures and treatments. Discuss use of supplemental oxygen including available portable oxygen systems, continuous and intermittent flow rates, concentrators, personal use and safety guidelines. Review proper use of inhaler and spacers. Provide informative websites for self-education.    Energy Conservation: - Provide group verbal and written instruction for methods to conserve energy, plan and organize activities. Instruct on pacing techniques, use of adaptive equipment and posture/positioning to relieve shortness of breath.   Triggers and Exacerbations: - Group verbal and written instruction to review types of environmental triggers and ways to prevent exacerbations. Discuss weather changes, air quality and the benefits of nasal washing. Review warning signs and symptoms to help prevent infections. Discuss techniques for effective airway clearance, coughing, and vibrations.   AED/CPR: - Group verbal and written instruction with the use of models to demonstrate the basic use of the AED with the basic ABC's of resuscitation.   Anatomy and Physiology of the Lungs: - Group verbal and written instruction with the use of models to provide basic lung anatomy and physiology related to function, structure and complications of lung disease.   Anatomy  & Physiology of the Heart: - Group verbal and written instruction and models provide basic cardiac anatomy and physiology, with the coronary electrical and arterial systems. Review of Valvular disease and Heart Failure   Cardiac Medications: - Group verbal and written instruction to review commonly prescribed medications for heart disease. Reviews the medication, class of the drug, and side effects.   Know Your Numbers and Risk Factors: -Group verbal and written instruction about important numbers in your health.  Discussion of what are risk factors and how they play a role in the disease process.  Review of Cholesterol, Blood Pressure, Diabetes, and BMI and the role they play in your overall health.   Sleep Hygiene: -Provides group verbal and written instruction about how sleep can affect your health.  Define sleep hygiene, discuss sleep cycles and impact of sleep habits. Review good sleep hygiene tips.    Other: -Provides group and verbal instruction on various topics (see comments)    Knowledge Questionnaire Score: Knowledge Questionnaire Score - 03/20/19 1226      Knowledge Questionnaire Score   Pre Score  15/16 Education Focus: Oxygen safety        Core Components/Risk Factors/Patient Goals at Admission: Personal Goals and Risk Factors at Admission - 03/20/19 1227      Core Components/Risk Factors/Patient Goals on Admission    Weight Management  Yes;Weight Loss;Obesity    Intervention  Weight Management: Develop a combined nutrition and exercise program designed to reach desired caloric intake, while maintaining appropriate intake of nutrient and fiber, sodium and fats, and appropriate energy expenditure required for the weight goal.;Weight Management: Provide education and appropriate resources to help participant work on and attain dietary goals.;Weight Management/Obesity: Establish reasonable short term and long term weight goals.;Obesity: Provide education and appropriate  resources to help participant work on and attain dietary goals.    Admit Weight  278 lb 9.6 oz (126.4  kg)    Goal Weight: Short Term  273 lb (123.8 kg)    Goal Weight: Long Term  268 lb (121.6 kg)    Expected Outcomes  Short Term: Continue to assess and modify interventions until short term weight is achieved;Long Term: Adherence to nutrition and physical activity/exercise program aimed toward attainment of established weight goal;Weight Loss: Understanding of general recommendations for a balanced deficit meal plan, which promotes 1-2 lb weight loss per week and includes a negative energy balance of [phone] kcal/d;Understanding recommendations for meals to include 15-35% energy as protein, 25-35% energy from fat, 35-60% energy from carbohydrates, less than 242m of dietary cholesterol, 20-35 gm of total fiber daily;Understanding of distribution of calorie intake throughout the day with the consumption of 4-5 meals/snacks    Improve shortness of breath with ADL's  Yes    Intervention  Provide education, individualized exercise plan and daily activity instruction to help decrease symptoms of SOB with activities of daily living.    Expected Outcomes  Short Term: Improve cardiorespiratory fitness to achieve a reduction of symptoms when performing ADLs;Long Term: Be able to perform more ADLs without symptoms or delay the onset of symptoms    Diabetes  Yes    Intervention  Provide education about signs/symptoms and action to take for hypo/hyperglycemia.;Provide education about proper nutrition, including hydration, and aerobic/resistive exercise prescription along with prescribed medications to achieve blood glucose in normal ranges: Fasting glucose 65-99 mg/dL    Expected Outcomes  Short Term: Participant verbalizes understanding of the signs/symptoms and immediate care of hyper/hypoglycemia, proper foot care and importance of medication, aerobic/resistive exercise and nutrition plan for blood glucose  control.;Long Term: Attainment of HbA1C < 7%.    Heart Failure  Yes    Intervention  Provide a combined exercise and nutrition program that is supplemented with education, support and counseling about heart failure. Directed toward relieving symptoms such as shortness of breath, decreased exercise tolerance, and extremity edema.    Expected Outcomes  Improve functional capacity of life;Short term: Attendance in program 2-3 days a week with increased exercise capacity. Reported lower sodium intake. Reported increased fruit and vegetable intake. Reports medication compliance.;Short term: Daily weights obtained and reported for increase. Utilizing diuretic protocols set by physician.;Long term: Adoption of self-care skills and reduction of barriers for early signs and symptoms recognition and intervention leading to self-care maintenance.    Hypertension  Yes    Intervention  Provide education on lifestyle modifcations including regular physical activity/exercise, weight management, moderate sodium restriction and increased consumption of fresh fruit, vegetables, and low fat dairy, alcohol moderation, and smoking cessation.;Monitor prescription use compliance.    Expected Outcomes  Short Term: Continued assessment and intervention until BP is < 140/936mHG in hypertensive participants. < 130/8027mG in hypertensive participants with diabetes, heart failure or chronic kidney disease.;Long Term: Maintenance of blood pressure at goal levels.    Lipids  Yes    Intervention  Provide education and support for participant on nutrition & aerobic/resistive exercise along with prescribed medications to achieve LDL <70m7mDL >40mg12m Expected Outcomes  Short Term: Participant states understanding of desired cholesterol values and is compliant with medications prescribed. Participant is following exercise prescription and nutrition guidelines.;Long Term: Cholesterol controlled with medications as prescribed, with  individualized exercise RX and with personalized nutrition plan. Value goals: LDL < 70mg,56m > 40 mg.       Core Components/Risk Factors/Patient Goals Review:  Goals and Risk Factor Review  [place] Name 04/11/19 0859 05/29/19 1103           Core Components/Risk Factors/Patient Goals Review   Personal Goals Review  Weight Management/Obesity;Heart Failure;Hypertension;Diabetes;Improve shortness of breath with ADL's;Lipids  Weight Management/Obesity;Heart Failure;Hypertension;Diabetes;Improve shortness of breath with ADL's;Lipids      Review  Morgon has been doing well in the program but has gone up on his weight. He wants to lose weight and we talked about how portion sizes are important. Heaton has been checking his sugar at home. His blood sugars have been in the 120s in the mornings and 150 at night. Shortness of breath is a problem for Teigen and was informed to PLB when he is short of breath.  Dexton is doing well. He is now getting hemodialysis and the plan is for it to eventually be home based.  He has started treatment and is keeping a close eye on his sugars and fluid level.  We will continue to monitor as well.      Expected Outcomes  Short: Attend [facility] regularly to improve shortness of breath with ADL's. Long: maintain independence with ADL's  Short: Get dialysis established and numbers leveled out. Long: Continue to monitor risk factors.         Core Components/Risk Factors/Patient Goals at Discharge (Final Review):  Goals and Risk Factor Review - 05/29/19 1103      Core Components/Risk Factors/Patient Goals Review   Personal Goals Review  Weight Management/Obesity;Heart Failure;Hypertension;Diabetes;Improve shortness of breath with ADL's;Lipids    Review  Rylyn is doing well. He is now getting hemodialysis and the plan is for it to eventually be home based.  He has started treatment and is keeping a close eye on his sugars and fluid level.  We will continue to monitor as well.     Expected Outcomes  Short: Get dialysis established and numbers leveled out. Long: Continue to monitor risk factors.       ITP Comments: ITP Comments    Row Name 03/19/19 1213 03/20/19 1158 03/26/19 0741 03/28/19 0735 04/03/19 0939   ITP Comments  Virtual Orientation performed. Patient informed when to come in for RD and EP orientation. Diagnosis can be found in [facility] 01/22/2019.  Completed 6MWT and gym orientation.  Initial ITP created and sent for review to Dr. Emily Filbert, Medical Director.  Weight up 2 pounds. Fluid pill stopped last week.  Has appt with MD on Wed this week.  He will call MD if weight continues to increase.  Weight up. Has appt with Kidney MD today and plans to talk about weight gain. Had called office and no response as of this AM  Completed initial RD eval   Row Name 04/04/19 1027 04/20/19 0914 04/20/19 0933 05/02/19 0904 05/28/19 1624   ITP Comments  30 day review competed . ITP sent to Dr Emily Filbert for review, changes as needed and ITP approval signature.  Noticed weight has been rising since admission. Ebenezer has appointment at [facility] today and has been given his exercise Log to share all session weights.  Note From [facility] today FYI: Dr. [name] (nephrology) had stopped his torsemide due to worsening GFR. Labs were checked on 12/16 but results aren't in. In Singh's note, he said patient could take torsemide as needed but he was waiting on the lab results first. I advised the patient to call Singh's officer later today if he hasn't heard anything so he would know if he could resume the torsemide.  [place]  day review competed . ITP sent to Dr Emily Filbert for review, changes as needed and ITP approval signature  Shean has not attended since 05/14/19.   [place] Name 05/29/19 1102 05/30/19 1435         ITP Comments  Jantzen is recovering from his dialysis catheter placement.  He had his first treatment yesterday and is feeling good.  He is going to talk to his doctor  about returning to class.  He also mentioned that they are planning to put in port for home based dialysis, but he was not sure when.  30 day review completed. ITP sent to Dr. Emily Filbert, Medical Director of Cardiac and Pulmonary Rehab. Continue with ITP unless changes are made by physician.  Department operating under reduced schedule until further notice by request from hospital leadership.  Pt has been out since 05/14/19 for medical hold getting ready for dialysis.         Comments: 30 day review

## 2019-06-01 ENCOUNTER — Encounter (INDEPENDENT_AMBULATORY_CARE_PROVIDER_SITE_OTHER): Payer: Self-pay

## 2019-06-01 ENCOUNTER — Encounter (INDEPENDENT_AMBULATORY_CARE_PROVIDER_SITE_OTHER): Payer: Self-pay | Admitting: Vascular Surgery

## 2019-06-01 ENCOUNTER — Other Ambulatory Visit: Payer: Self-pay

## 2019-06-01 ENCOUNTER — Ambulatory Visit (INDEPENDENT_AMBULATORY_CARE_PROVIDER_SITE_OTHER): Payer: Commercial Managed Care - PPO | Admitting: Vascular Surgery

## 2019-06-01 VITALS — BP 172/81 | HR 79 | Resp 16 | Wt 301.6 lb

## 2019-06-01 DIAGNOSIS — Z794 Long term (current) use of insulin: Secondary | ICD-10-CM

## 2019-06-01 DIAGNOSIS — N186 End stage renal disease: Secondary | ICD-10-CM

## 2019-06-01 DIAGNOSIS — E785 Hyperlipidemia, unspecified: Secondary | ICD-10-CM

## 2019-06-01 DIAGNOSIS — E1140 Type 2 diabetes mellitus with diabetic neuropathy, unspecified: Secondary | ICD-10-CM

## 2019-06-01 DIAGNOSIS — I10 Essential (primary) hypertension: Secondary | ICD-10-CM

## 2019-06-01 DIAGNOSIS — Z992 Dependence on renal dialysis: Secondary | ICD-10-CM

## 2019-06-01 NOTE — Assessment & Plan Note (Signed)
Recommend:  The patient has advanced renal disease but currently does not have adequate permanent dialysis access.  He has a PermCath in place, and it is currently working reasonably well he does feel better with dialysis treatments. The patient has elected to move forward with peritoneal dialysis. There is no history of prior abdominal operations other than laparoscopic cholecystectomy, thus this does not appear at this time to be a prohibitive situation.  This was also discussed.  Risk and benefits were reviewed the patient.  Indications for the procedure were reviewed.  All questions were answered, the patient agrees to proceed with laparoscopic PD catheter insertion. We discussed the differences between peritoneal dialysis and hemodialysis.  We discussed the procedures of fistula and graft placement versus peritoneal dialysis.  Discussion included the treatment options for dialysis access and perioperative management.

## 2019-06-01 NOTE — Patient Instructions (Signed)
Peritoneal Dialysis Catheter Placement, Care After This sheet gives you information about how to care for yourself after your procedure. Your health care provider may also give you more specific instructions. If you have problems or questions, contact your health care provider. What can I expect after the procedure?  After the procedure, it is common to have some pain or discomfort in your abdomen, your incision area, or both.  You may need to wait 2 weeks after your procedure before you can start peritoneal dialysis treatment. If you need dialysis before that time, your health care provider may begin peritoneal dialysis treatment early or offer kidney dialysis treatments (hemodialysis) until you heal. Follow these instructions at home: Incision care   Follow instructions from your health care provider about how to take care of your incision(s). Make sure you: ? Change your dressing only as told by your health care provider. Your health care provider may tell you to not touch or change your dressing. ? Wash your hands with soap and water before you change your bandage (dressing). If soap and water are not available, use hand sanitizer. ? Leave stitches (sutures), skin glue, or adhesive strips in place. These skin closures may need to stay in place for two weeks or longer. If adhesive strip edges start to loosen and curl up, you may trim the loose edges. Do not remove adhesive strips completely unless your health care provider tells you to do that.  Check your incision area(s) every day for signs of infection. (If you were instructed to not touch or change your dressing, look at your dressing for signs of infection.) Check for: ? Redness, swelling, or more pain. ? Fluid or blood. ? Warmth. ? Pus or a bad smell. Medicines  Take over-the-counter and prescription medicines only as told by your health care provider.  If you were prescribed an antibiotic medicine, take it as told by your health  care provider. Do not stop using the antibiotic even if your condition improves. Driving  Do not drive or ride in a car until your health care provider approves. Your seat belt could move the catheter out of position or cause irritation by rubbing on your incision.  Do not drive or use heavy machinery while taking prescription pain medicine. Activity  Rest and limit your activity. Return to your normal activities as told by your health care provider. Ask your health care provider what activities are safe for you.  Do not lift anything that is heavier than 10 lb (4.5 kg), or the limit that you are told, until your health care provider says that it is safe. Preventing constipation  To prevent or treat constipation, your health care provider may recommend that you: ? Take over-the-counter or prescription medicines. ? Eat foods that are high in fiber, such as fresh fruits and vegetables, whole grains, and beans. ? Limit foods that are high in fat and processed sugars, such as fried and sweet foods. General instructions  Do not use any products that contain nicotine or tobacco, such as cigarettes and e-cigarettes. If you need help quitting, ask your health care provider.  Follow instructions from your health care provider about eating or drinking restrictions.  Do not take baths, swim, or use a hot tub until your health care provider approves. Ask your health care provider if you may take showers. You may only be allowed to take sponge baths.  Wear loose-fitting clothing that keeps the catheter covered so that it cannot get caught on something.    Keep your catheter clean and dry.  Keep all follow-up visits as told by your health care provider. This is important. Contact a health care provider if:  You have a fever.  You have redness, swelling, or more pain around an incision.  You have fluid or blood coming from an incision.  An incision feels warm to the touch.  You have pus or a  bad smell coming from an incision.  You cannot eat or drink without vomiting. Get help right away if:  You have problems breathing.  You are confused.  You have trouble speaking.  You have severe pain in your abdomen that does not get better with treatment.  You have bright red blood in your stool (feces), or your stool is dark black and looks like tar. These symptoms may represent a serious problem that is an emergency. Do not wait to see if the symptoms will go away. Get medical help right away. Call your local emergency services (911 in the [place] Do not drive yourself to the hospital. Summary  After the procedure, it is common to have some pain or discomfort in your abdomen, your incision, or both.  You may have to wait 2 weeks after your procedure before you can start peritoneal dialysis treatment.  Check your incision area(s) every day for signs of infection.  Get medical help right away if you have severe pain in your abdomen that does not get better with treatment. This information is not intended to replace advice given to you by your health care provider. Make sure you discuss any questions you have with your health care provider. Document Revised: 08/10/2018 Document Reviewed: 10/22/2016 [employer]vier Patient Education  2020 [employer]  

## 2019-06-01 NOTE — Assessment & Plan Note (Signed)
lipid control important in reducing the progression of atherosclerotic disease. Continue statin therapy  

## 2019-06-01 NOTE — Assessment & Plan Note (Signed)
blood pressure control important in reducing the progression of atherosclerotic disease. On appropriate oral medications.  

## 2019-06-01 NOTE — Assessment & Plan Note (Signed)
blood glucose control important in reducing the progression of atherosclerotic disease. Also, involved in wound healing. On appropriate medications.  

## 2019-06-01 NOTE — Progress Notes (Signed)
MRN : [id number]  Patrick Hurst is a 64 y.o. (Nov 23, 1955) male who presents with chief complaint of  Chief Complaint  Patient presents with   New Patient (Initial Visit)    ref Patrick Hurst discuss pd placement  .  History of Present Illness: Patient returns today in follow up of his dialysis access needs. We placed a permcath earlier this month. He feels better after starting dialysis. He is still working.  He has done lots of research to determine whether or not he wants to do hemodialysis or peritoneal dialysis, and has elected to begin peritoneal dialysis.  He has had a laparoscopic cholecystectomy but no other abdominal surgeries.  He has no abdominal complaints today.  No fevers or chills.  He is doing dialysis on Mondays, Wednesdays, and Fridays through his PermCath.  Current Outpatient Medications  Medication Sig Dispense Refill   albuterol (VENTOLIN HFA) 108 (90 Base) MCG/ACT inhaler USE 2 INHALATIONS EVERY 4 HOURS AS NEEDED FOR WHEEZING OR SHORTNESS OF BREATH 8.5 g 10   amiodarone (PACERONE) 200 MG tablet Take 400 mg (two tabs) twice daily for 5 more days, then take 200 mg (1 tab) daily 45 tablet 0   amitriptyline (ELAVIL) 25 MG tablet Take 1 tablet (25 mg total) by mouth at bedtime. 90 tablet 3   amLODipine (NORVASC) 5 MG tablet Take 1 tablet (5 mg total) by mouth daily. 30 tablet 3   Ascorbic Acid (VITAMIN C) 1000 MG tablet Take 1,000 mg by mouth 2 (two) times daily.      atorvastatin (LIPITOR) 40 MG tablet TAKE 1 TABLET DAILY 90 tablet 0   calcitRIOL (ROCALTROL) 0.5 MCG capsule Take 0.5 mcg by mouth daily.     cetirizine (ZYRTEC) 10 MG tablet Take 10 mg by mouth daily.      ELIQUIS 2.5 MG TABS tablet SMARTSIG:1 Tablet(s) By Mouth Every 12 Hours     fluticasone furoate-vilanterol (BREO ELLIPTA) 100-25 MCG/INH AEPB Inhale 1 puff into the lungs daily. 180 each 3   hydrALAZINE (APRESOLINE) 25 MG tablet TAKE 1 TABLET THREE TIMES A DAY 270 tablet 4   insulin regular human  CONCENTRATED (HUMULIN R) 500 UNIT/ML injection Inject up to 25 units twice a day, or as directed by physician 60 mL 3   ipratropium-albuterol (DUONEB) 0.5-2.5 (3) MG/3ML SOLN INHALE 3 ML BY NEBULIZATION EVERY 4 HOURS AS NEEDED 360 mL 8   levothyroxine (SYNTHROID) 100 MCG tablet Take 1 tablet (100 mcg total) by mouth daily. 90 tablet 3   metoprolol tartrate (LOPRESSOR) 50 MG tablet Take 0.5 tablets (25 mg total) by mouth 2 (two) times daily.     Misc. Devices (PULSE OXIMETER FOR FINGER) MISC 1 Device by Does not apply route daily as needed. 1 each 0   pantoprazole (PROTONIX) 40 MG tablet TAKE 1 TABLET TWICE A DAY 180 tablet 3   testosterone cypionate (DEPO-TESTOSTERONE) 200 MG/ML injection Inject 1 mL (200 mg total) into the muscle every 14 (fourteen) days. 6 mL 1   ULORIC 80 MG TABS Take 80 mg by mouth daily.      vitamin B-12 (CYANOCOBALAMIN) 1000 MCG tablet Take 1,000 mcg by mouth daily.     doxycycline (VIBRA-TABS) 100 MG tablet Take 1 tablet (100 mg total) by mouth 2 (two) times daily. (Patient not taking: Reported on 05/24/2019) 14 tablet 0   No current facility-administered medications for this visit.    Past Medical History:  Diagnosis Date   Allergy    Anemia  Arthritis    Bell's palsy    CHF (congestive heart failure) (HCC)    CKD (chronic kidney disease)    Depression    Diabetes (HCC)    Gastric ulcer    GERD (gastroesophageal reflux disease)    Gout    History of chicken pox    Hyperlipidemia    Hypertension    Hypothyroidism    OSA (obstructive sleep apnea)    Thyroid disease    Vitamin D deficiency     Past Surgical History:  Procedure Laterality Date   CATARACT EXTRACTION Bilateral 2014 and 2015   CHOLECYSTECTOMY  04/28/2011   Laproscopic; Dr. Pat Patrick   DIALYSIS/PERMA CATHETER INSERTION N/A 05/24/2019   Procedure: DIALYSIS/PERMA CATHETER INSERTION;  Surgeon: Algernon Huxley, MD;  Location: [facility];  Service: Cardiovascular;   Laterality: N/A;   GALLBLADDER SURGERY     LASIK Bilateral 2019   medical   SHOULDER SURGERY Right 2012   Dr. Leanor Kail    Social History   Tobacco Use   Smoking status: Former Smoker    Packs/day: 1.00    Years: 15.00    Pack years: 15.00    Types: Cigarettes    Quit date: 05/03/1978    Years since quitting: 41.1   Smokeless tobacco: Former Systems developer   Tobacco comment: started smoking at age 76  Substance Use Topics   Alcohol use: No    Alcohol/week: 0.0 standard drinks   Drug use: No  married   Family History  Problem Relation Age of Onset   COPD Mother    Heart disease Father   no bleeding or clotting disorders  Allergies  Allergen Reactions   Guaifenesin Swelling    Throat swelling, increased heart rate.   Mucinex  [Guaifenesin Er]     Throat swelling, increases heart rate   Levaquin [Levofloxacin] Palpitations     REVIEW OF SYSTEMS (Negative unless checked)  Constitutional: [] Weight loss  [] Fever  [] Chills Cardiac: [] Chest pain   [] Chest pressure   [] Palpitations   [] Shortness of breath when laying flat   [] Shortness of breath at rest   [x] Shortness of breath with exertion. Vascular:  [] Pain in legs with walking   [] Pain in legs at rest   [] Pain in legs when laying flat   [] Claudication   [] Pain in feet when walking  [] Pain in feet at rest  [] Pain in feet when laying flat   [] History of DVT   [] Phlebitis   [] Swelling in legs   [] Varicose veins   [] Non-healing ulcers Pulmonary:   [] Uses home oxygen   [] Productive cough   [] Hemoptysis   [] Wheeze  [] COPD   [x] Asthma Neurologic:  [] Dizziness  [] Blackouts   [] Seizures   [] History of stroke   [] History of TIA  [] Aphasia   [] Temporary blindness   [] Dysphagia   [] Weakness or numbness in arms   [] Weakness or numbness in legs Musculoskeletal:  [x] Arthritis   [] Joint swelling   [] Joint pain   [] Low back pain Hematologic:  [] Easy bruising  [] Easy bleeding   [] Hypercoagulable state   [] Anemic     Gastrointestinal:  [] Blood in stool   [] Vomiting blood  [x] Gastroesophageal reflux/heartburn   [] Abdominal pain Genitourinary:  [x] Chronic kidney disease   [] Difficult urination  [] Frequent urination  [] Burning with urination   [] Hematuria Skin:  [] Rashes   [] Ulcers   [] Wounds Psychological:  [] History of anxiety   []  History of major depression.  Physical Examination  BP (!) 172/81 (BP Location: Right Arm)  Pulse 79    Resp 16    Wt (!) 301 lb 9.6 oz (136.8 kg)    BMI 42.06 kg/m  Gen:  WD/WN, NAD Head: [place]/AT, No temporalis wasting. Ear/Nose/Throat: Hearing grossly intact, nares w/o erythema or drainage Eyes: Conjunctiva clear. Sclera non-icteric Neck: Supple.  Trachea midline Pulmonary:  Good air movement, no use of accessory muscles.  Cardiac: RRR, no JVD Vascular: Permcath right chest, no erythema. Vessel Right Left  Radial Palpable Palpable                          PT Palpable Palpable  DP Palpable Palpable   Gastrointestinal: soft, non-tender/non-distended. No guarding/reflex.  Musculoskeletal: M/S 5/5 throughout.  No deformity or atrophy. Mild BLE edema. Neurologic: Sensation grossly intact in extremities.  Symmetrical.  Speech is fluent.  Psychiatric: Judgment intact, Mood & affect appropriate for pt's clinical situation. Dermatologic: No rashes or ulcers noted.  No cellulitis or open wounds.       Labs Recent Results (from the past 2160 hour(s))  Blood gas, arterial     Status: Abnormal   Collection Time: 03/03/19  4:43 PM  Result Value Ref Range   FIO2 0.60    Delivery systems HI FLOW NASAL CANNULA    pH, Arterial 7.38 7.350 - 7.450   pCO2 arterial 37 32.0 - 48.0 mmHg   pO2, Arterial 66 (L) 83.0 - 108.0 mmHg   Bicarbonate 21.9 20.0 - 28.0 mmol/L   Acid-base deficit 2.8 (H) 0.0 - 2.0 mmol/L   O2 Saturation 92.3 %   Patient temperature 37.0    Collection site RIGHT RADIAL    Sample type ARTERIAL DRAW    Allens test (pass/fail) PASS PASS    Comment:  Performed at [facility], [address] [place], [place] [address]  Glucose, capillary     Status: Abnormal   Collection Time: 03/03/19  4:50 PM  Result Value Ref Range   Glucose-Capillary 214 (H) 70 - 99 mg/dL  Glucose, capillary     Status: Abnormal   Collection Time: 03/03/19  8:39 PM  Result Value Ref Range   Glucose-Capillary 165 (H) 70 - 99 mg/dL  CBC with Differential/Platelet     Status: Abnormal   Collection Time: 03/04/19  5:48 AM  Result Value Ref Range   WBC 9.3 4.0 - 10.5 K/uL   RBC 2.89 (L) 4.22 - 5.81 MIL/uL   Hemoglobin 8.5 (L) 13.0 - 17.0 g/dL   HCT 26.5 (L) 39.0 - 52.0 %   MCV 91.7 80.0 - 100.0 fL   MCH 29.4 26.0 - 34.0 pg   MCHC 32.1 30.0 - 36.0 g/dL   RDW 15.9 (H) 11.5 - 15.5 %   Platelets 123 (L) 150 - 400 K/uL   nRBC 0.0 0.0 - 0.2 %   Neutrophils Relative % 84 %   Neutro Abs 7.9 (H) 1.7 - 7.7 K/uL   Lymphocytes Relative 6 %   Lymphs Abs 0.6 (L) 0.7 - 4.0 K/uL   Monocytes Relative 8 %   Monocytes Absolute 0.7 0.1 - 1.0 K/uL   Eosinophils Relative 0 %   Eosinophils Absolute 0.0 0.0 - 0.5 K/uL   Basophils Relative 1 %   Basophils Absolute 0.1 0.0 - 0.1 K/uL   Immature Granulocytes 1 %   Abs Immature Granulocytes 0.08 (H) 0.00 - 0.07 K/uL    Comment: Performed at [facility], [address] [place], [place] [id number]  Basic metabolic panel  Status: Abnormal   Collection Time: 03/04/19  5:48 AM  Result Value Ref Range   Sodium 139 135 - 145 mmol/L   Potassium 4.3 3.5 - 5.1 mmol/L   Chloride 106 98 - 111 mmol/L   CO2 23 22 - 32 mmol/L   Glucose, Bld 144 (H) 70 - 99 mg/dL   BUN 93 (H) 8 - 23 mg/dL   Creatinine, Ser 4.11 (H) 0.61 - 1.24 mg/dL   Calcium 8.3 (L) 8.9 - 10.3 mg/dL   GFR calc non Af Amer 15 (L) >60 mL/min   GFR calc Af Amer 17 (L) >60 mL/min   Anion gap 10 5 - 15    Comment: Performed at [facility], [address] [place], [place] [address]  TSH     Status: None   Collection Time: 03/04/19  5:48 AM    Result Value Ref Range   TSH 2.518 0.350 - 4.500 uIU/mL    Comment: Performed by a 3rd Generation assay with a functional sensitivity of <=0.01 uIU/mL. Performed at [facility], [address] [place], [place] [address]   Magnesium     Status: None   Collection Time: 03/04/19  5:48 AM  Result Value Ref Range   Magnesium 2.0 1.7 - 2.4 mg/dL    Comment: Performed at [facility], [address] [place], [place] [address]  Glucose, capillary     Status: Abnormal   Collection Time: 03/04/19  7:43 AM  Result Value Ref Range   Glucose-Capillary 153 (H) 70 - 99 mg/dL  Glucose, capillary     Status: Abnormal   Collection Time: 03/04/19  9:46 AM  Result Value Ref Range   Glucose-Capillary 174 (H) 70 - 99 mg/dL  MRSA PCR Screening     Status: None   Collection Time: 03/04/19  9:50 AM   Specimen: Nasopharyngeal  Result Value Ref Range   MRSA by PCR NEGATIVE NEGATIVE    Comment:        The GeneXpert MRSA Assay (FDA approved for NASAL specimens only), is one component of a comprehensive MRSA colonization surveillance program. It is not intended to diagnose MRSA infection nor to guide or monitor treatment for MRSA infections. Performed at [facility], [address] [place], [place] [address]   Glucose, capillary     Status: Abnormal   Collection Time: 03/04/19 11:26 AM  Result Value Ref Range   Glucose-Capillary 182 (H) 70 - 99 mg/dL  APTT     Status: None   Collection Time: 03/04/19 12:00 PM  Result Value Ref Range   aPTT 32 24 - 36 seconds    Comment: Performed at [facility], [address] [place], [place] [address]  Glucose, capillary     Status: Abnormal   Collection Time: 03/04/19  4:15 PM  Result Value Ref Range   Glucose-Capillary 218 (H) 70 - 99 mg/dL  Brain natriuretic peptide     Status: Abnormal   Collection Time: 03/04/19  5:24 PM  Result Value Ref Range   B Natriuretic Peptide 315.0 (H) 0.0 - 100.0 pg/mL     Comment: Performed at [facility], [place], [place] [address]  Troponin I (High Sensitivity)     Status: Abnormal   Collection Time: 03/04/19  5:24 PM  Result Value Ref Range   Troponin I (High Sensitivity) 232 (HH) <18 ng/L    Comment: CRITICAL VALUE NOTED. VALUE IS CONSISTENT WITH PREVIOUSLY REPORTED/CALLED VALUE READ BACK AND  VERIFIED WITH MYRA FLOWERS AT 1802 03/04/2019.PMF (NOTE) Elevated high sensitivity troponin I (hsTnI) values and significant  changes across serial measurements may suggest ACS but many other  chronic and acute conditions are known to elevate hsTnI results.  Refer to the "Links" section for chest pain algorithms and additional  guidance. Performed at [facility], [place], [place] [address]   Heparin level (unfractionated)     Status: Abnormal   Collection Time: 03/04/19  8:32 PM  Result Value Ref Range   Heparin Unfractionated <0.10 (L) 0.30 - 0.70 IU/mL    Comment: (NOTE) If heparin results are below expected values, and patient dosage has  been confirmed, suggest follow up testing of antithrombin III levels. Performed at [facility], [address] [place], [place] [address]   Troponin I (High Sensitivity)     Status: Abnormal   Collection Time: 03/04/19  8:32 PM  Result Value Ref Range   Troponin I (High Sensitivity) 469 (HH) <18 ng/L    Comment: CRITICAL VALUE NOTED. VALUE IS CONSISTENT WITH PREVIOUSLY REPORTED/CALLED VALUE [place] (NOTE) Elevated high sensitivity troponin I (hsTnI) values and significant  changes across serial measurements may suggest ACS but many other  chronic and acute conditions are known to elevate hsTnI results.  Refer to the "Links" section for chest pain algorithms and additional  guidance. Performed at [facility], [address] [place], [place] [address]   Glucose, capillary     Status: Abnormal   Collection Time: 03/04/19  9:47 PM  Result Value  Ref Range   Glucose-Capillary 251 (H) 70 - 99 mg/dL  Glucose, capillary     Status: Abnormal   Collection Time: 03/05/19  2:33 AM  Result Value Ref Range   Glucose-Capillary 236 (H) 70 - 99 mg/dL  Phosphorus     Status: Abnormal   Collection Time: 03/05/19  4:19 AM  Result Value Ref Range   Phosphorus 5.2 (H) 2.5 - 4.6 mg/dL    Comment: Performed at [facility], [address] [place], [place] [address]  CBC     Status: Abnormal   Collection Time: 03/05/19  4:19 AM  Result Value Ref Range   WBC 7.2 4.0 - 10.5 K/uL   RBC 2.80 (L) 4.22 - 5.81 MIL/uL   Hemoglobin 8.1 (L) 13.0 - 17.0 g/dL   HCT 24.8 (L) 39.0 - 52.0 %   MCV 88.6 80.0 - 100.0 fL   MCH 28.9 26.0 - 34.0 pg   MCHC 32.7 30.0 - 36.0 g/dL   RDW 15.3 11.5 - 15.5 %   Platelets 115 (L) 150 - 400 K/uL    Comment: REPEATED TO VERIFY Immature Platelet Fraction may be clinically indicated, consider ordering this additional test [id number]    nRBC 0.0 0.0 - 0.2 %    Comment: Performed at [facility], [address] [place], [place] [address]  Heparin level (unfractionated)     Status: Abnormal   Collection Time: 03/05/19  6:02 AM  Result Value Ref Range   Heparin Unfractionated 0.16 (L) 0.30 - 0.70 IU/mL    Comment: (NOTE) If heparin results are below expected values, and patient dosage has  been confirmed, suggest follow up testing of antithrombin III levels. Performed at [facility], [address], [place], [place] [address]   Comprehensive metabolic panel     Status: Abnormal   Collection Time: 03/05/19  6:02 AM  Result Value Ref Range   Sodium 137 135 -  145 mmol/L   Potassium 4.2 3.5 - 5.1 mmol/L   Chloride 104 98 - 111 mmol/L   CO2 23 22 - 32 mmol/L   Glucose, Bld 215 (H) 70 - 99 mg/dL   BUN 93 (H) 8 - 23 mg/dL   Creatinine, Ser 4.08 (H) 0.61 - 1.24 mg/dL   Calcium 8.5 (L) 8.9 - 10.3 mg/dL   Total Protein 7.1 6.5 - 8.1 g/dL   Albumin 3.3 (L) 3.5 - 5.0 g/dL   AST 13 (L) 15 - 41  U/L   ALT 16 0 - 44 U/L   Alkaline Phosphatase 57 38 - 126 U/L   Total Bilirubin 1.0 0.3 - 1.2 mg/dL   GFR calc non Af Amer 15 (L) >60 mL/min   GFR calc Af Amer 17 (L) >60 mL/min   Anion gap 10 5 - 15    Comment: Performed at [facility], [address], [place], [place] [address]  Magnesium     Status: None   Collection Time: 03/05/19  6:02 AM  Result Value Ref Range   Magnesium 2.1 1.7 - 2.4 mg/dL    Comment: Performed at [facility], [address] [place], [place] [address]  Procalcitonin - Baseline     Status: None   Collection Time: 03/05/19  6:02 AM  Result Value Ref Range   Procalcitonin 0.36 ng/mL    Comment:        Interpretation: PCT (Procalcitonin) <= 0.5 ng/mL: Systemic infection (sepsis) is not likely. Local bacterial infection is possible. (NOTE)       Sepsis PCT Algorithm           Lower Respiratory Tract                                      Infection PCT Algorithm    ----------------------------     ----------------------------         PCT < 0.25 ng/mL                PCT < 0.10 ng/mL         Strongly encourage             Strongly discourage   discontinuation of antibiotics    initiation of antibiotics    ----------------------------     -----------------------------       PCT 0.25 - 0.50 ng/mL            PCT 0.10 - 0.25 ng/mL               OR       >80% decrease in PCT            Discourage initiation of                                            antibiotics      Encourage discontinuation           of antibiotics    ----------------------------     -----------------------------         PCT >= 0.50 ng/mL              PCT 0.26 - 0.50 ng/mL               AND        <  80% decrease in PCT             Encourage initiation of                                             antibiotics       Encourage continuation           of antibiotics    ----------------------------     -----------------------------        PCT >= 0.50 ng/mL                   PCT > 0.50 ng/mL               AND         increase in PCT                  Strongly encourage                                      initiation of antibiotics    Strongly encourage escalation           of antibiotics                                     -----------------------------                                           PCT <= 0.25 ng/mL                                                 OR                                        > 80% decrease in PCT                                     Discontinue / Do not initiate                                             antibiotics Performed at [facility], [address] [place], [place] [address]   Glucose, capillary     Status: Abnormal   Collection Time: 03/05/19  7:25 AM  Result Value Ref Range   Glucose-Capillary 201 (H) 70 - 99 mg/dL  Troponin I (High Sensitivity)     Status: Abnormal   Collection Time: 03/05/19 10:17 AM  Result Value Ref Range   Troponin I (High Sensitivity) 525 (HH) <18 ng/L    Comment: CRITICAL RESULT CALLED TO, READ BACK BY AND VERIFIED WITH TAYLOR FOSTER 03/05/2019 1107 [facility] (NOTE) Elevated high sensitivity troponin I (hsTnI) values and significant  changes across  serial measurements may suggest ACS but many other  chronic and acute conditions are known to elevate hsTnI results.  Refer to the "Links" section for chest pain algorithms and additional  guidance. Performed at [facility], [address] [place], [place] [address]   Glucose, capillary     Status: Abnormal   Collection Time: 03/05/19 11:28 AM  Result Value Ref Range   Glucose-Capillary 276 (H) 70 - 99 mg/dL  Glucose, capillary     Status: Abnormal   Collection Time: 03/05/19  4:39 PM  Result Value Ref Range   Glucose-Capillary 243 (H) 70 - 99 mg/dL  Glucose, capillary     Status: Abnormal   Collection Time: 03/05/19  7:49 PM  Result Value Ref Range   Glucose-Capillary 202 (H) 70 - 99 mg/dL  Glucose, capillary     Status:  Abnormal   Collection Time: 03/05/19  9:38 PM  Result Value Ref Range   Glucose-Capillary 208 (H) 70 - 99 mg/dL  Basic metabolic panel     Status: Abnormal   Collection Time: 03/06/19  3:37 AM  Result Value Ref Range   Sodium 140 135 - 145 mmol/L   Potassium 4.5 3.5 - 5.1 mmol/L   Chloride 103 98 - 111 mmol/L   CO2 26 22 - 32 mmol/L   Glucose, Bld 192 (H) 70 - 99 mg/dL   BUN 90 (H) 8 - 23 mg/dL   Creatinine, Ser 4.03 (H) 0.61 - 1.24 mg/dL   Calcium 9.0 8.9 - 10.3 mg/dL   GFR calc non Af Amer 15 (L) >60 mL/min   GFR calc Af Amer 17 (L) >60 mL/min   Anion gap 11 5 - 15    Comment: Performed at [facility], [address] [place], [place] [address]  Magnesium     Status: None   Collection Time: 03/06/19  3:37 AM  Result Value Ref Range   Magnesium 2.1 1.7 - 2.4 mg/dL    Comment: Performed at [facility], [address] [place], [place] [address]  Glucose, capillary     Status: Abnormal   Collection Time: 03/06/19  7:35 AM  Result Value Ref Range   Glucose-Capillary 198 (H) 70 - 99 mg/dL  ECHOCARDIOGRAM COMPLETE     Status: None   Collection Time: 03/06/19 10:04 AM  Result Value Ref Range   Weight 4,772.52 oz   Height 71 in   BP 146/74 mmHg  Glucose, capillary     Status: Abnormal   Collection Time: 03/06/19 11:22 AM  Result Value Ref Range   Glucose-Capillary 295 (H) 70 - 99 mg/dL  Glucose, capillary     Status: Abnormal   Collection Time: 03/06/19  4:42 PM  Result Value Ref Range   Glucose-Capillary 234 (H) 70 - 99 mg/dL  Glucose, capillary     Status: Abnormal   Collection Time: 03/06/19 10:16 PM  Result Value Ref Range   Glucose-Capillary 215 (H) 70 - 99 mg/dL  Basic metabolic panel     Status: Abnormal   Collection Time: 03/07/19  3:38 AM  Result Value Ref Range   Sodium 139 135 - 145 mmol/L   Potassium 4.2 3.5 - 5.1 mmol/L   Chloride 101 98 - 111 mmol/L   CO2 26 22 - 32 mmol/L   Glucose, Bld 148 (H) 70 - 99 mg/dL   BUN 97 (H) 8 - 23  mg/dL   Creatinine, Ser 4.11 (H) 0.61 - 1.24 mg/dL   Calcium 9.3 8.9 -  10.3 mg/dL   GFR calc non Af Amer 15 (L) >60 mL/min   GFR calc Af Amer 17 (L) >60 mL/min   Anion gap 12 5 - 15    Comment: Performed at [facility], [address] [place], [place] [address]  CBC     Status: Abnormal   Collection Time: 03/07/19  3:38 AM  Result Value Ref Range   WBC 6.6 4.0 - 10.5 K/uL   RBC 2.93 (L) 4.22 - 5.81 MIL/uL   Hemoglobin 8.5 (L) 13.0 - 17.0 g/dL   HCT 25.8 (L) 39.0 - 52.0 %   MCV 88.1 80.0 - 100.0 fL   MCH 29.0 26.0 - 34.0 pg   MCHC 32.9 30.0 - 36.0 g/dL   RDW 14.9 11.5 - 15.5 %   Platelets 149 (L) 150 - 400 K/uL   nRBC 0.0 0.0 - 0.2 %    Comment: Performed at [facility], [address] [place], [place] [address]  Phosphorus     Status: Abnormal   Collection Time: 03/07/19  3:38 AM  Result Value Ref Range   Phosphorus 6.1 (H) 2.5 - 4.6 mg/dL    Comment: Performed at [facility], [address] [place], [place] [address]  Glucose, capillary     Status: Abnormal   Collection Time: 03/07/19  8:05 AM  Result Value Ref Range   Glucose-Capillary 183 (H) 70 - 99 mg/dL  Glucose, capillary     Status: Abnormal   Collection Time: 03/07/19 11:13 AM  Result Value Ref Range   Glucose-Capillary 308 (H) 70 - 99 mg/dL  Glucose, capillary     Status: Abnormal   Collection Time: 03/07/19  4:13 PM  Result Value Ref Range   Glucose-Capillary 236 (H) 70 - 99 mg/dL  Glucose, capillary     Status: Abnormal   Collection Time: 03/07/19  9:38 PM  Result Value Ref Range   Glucose-Capillary 203 (H) 70 - 99 mg/dL  CBC     Status: Abnormal   Collection Time: 03/08/19  5:25 AM  Result Value Ref Range   WBC 7.4 4.0 - 10.5 K/uL   RBC 3.26 (L) 4.22 - 5.81 MIL/uL   Hemoglobin 9.5 (L) 13.0 - 17.0 g/dL   HCT 28.4 (L) 39.0 - 52.0 %   MCV 87.1 80.0 - 100.0 fL   MCH 29.1 26.0 - 34.0 pg   MCHC 33.5 30.0 - 36.0 g/dL   RDW 14.7 11.5 - 15.5 %   Platelets 147 (L) 150 - 400  K/uL   nRBC 0.0 0.0 - 0.2 %    Comment: Performed at [facility], [address] [place], [place] [id number]  Basic metabolic panel     Status: Abnormal   Collection Time: 03/08/19  5:25 AM  Result Value Ref Range   Sodium 139 135 - 145 mmol/L   Potassium 4.6 3.5 - 5.1 mmol/L   Chloride 101 98 - 111 mmol/L   CO2 26 22 - 32 mmol/L   Glucose, Bld 222 (H) 70 - 99 mg/dL   BUN 95 (H) 8 - 23 mg/dL    Comment: RESULT CONFIRMED BY MANUAL DILUTION JJB   Creatinine, Ser 4.21 (H) 0.61 - 1.24 mg/dL   Calcium 10.0 8.9 - 10.3 mg/dL   GFR calc non Af Amer 14 (L) >60 mL/min   GFR calc Af Amer 16 (L) >60 mL/min   Anion gap 12 5 - 15    Comment: Performed at [facility], [address] [place], [place] [address]  Glucose, capillary     Status: Abnormal   Collection Time: 03/08/19  7:39 AM  Result Value Ref Range   Glucose-Capillary 211 (H) 70 - 99 mg/dL  Troponin I (High Sensitivity)     Status: Abnormal   Collection Time: 03/08/19  8:42 AM  Result Value Ref Range   Troponin I (High Sensitivity) 188 (HH) <18 ng/L    Comment: CRITICAL VALUE NOTED. VALUE IS CONSISTENT WITH PREVIOUSLY REPORTED/CALLED VALUE JJB (NOTE) Elevated high sensitivity troponin I (hsTnI) values and significant  changes across serial measurements may suggest ACS but many other  chronic and acute conditions are known to elevate hsTnI results.  Refer to the "Links" section for chest pain algorithms and additional  guidance. Performed at [facility], [address] [place], [place] [address]   Glucose, capillary     Status: Abnormal   Collection Time: 03/08/19 11:05 AM  Result Value Ref Range   Glucose-Capillary 339 (H) 70 - 99 mg/dL  Glucose, capillary     Status: Abnormal   Collection Time: 03/08/19  4:02 PM  Result Value Ref Range   Glucose-Capillary 210 (H) 70 - 99 mg/dL  Glucose, capillary     Status: Abnormal   Collection Time: 03/08/19  9:31 PM  Result Value Ref Range    Glucose-Capillary 205 (H) 70 - 99 mg/dL  CBC     Status: Abnormal   Collection Time: 03/09/19  5:22 AM  Result Value Ref Range   WBC 7.4 4.0 - 10.5 K/uL   RBC 3.11 (L) 4.22 - 5.81 MIL/uL   Hemoglobin 8.9 (L) 13.0 - 17.0 g/dL   HCT 27.4 (L) 39.0 - 52.0 %   MCV 88.1 80.0 - 100.0 fL   MCH 28.6 26.0 - 34.0 pg   MCHC 32.5 30.0 - 36.0 g/dL   RDW 14.7 11.5 - 15.5 %   Platelets 167 150 - 400 K/uL   nRBC 0.0 0.0 - 0.2 %    Comment: Performed at [facility], [address], [place], [place] [id number]  Basic metabolic panel     Status: Abnormal   Collection Time: 03/09/19  5:22 AM  Result Value Ref Range   Sodium 139 135 - 145 mmol/L   Potassium 4.3 3.5 - 5.1 mmol/L   Chloride 102 98 - 111 mmol/L   CO2 24 22 - 32 mmol/L   Glucose, Bld 162 (H) 70 - 99 mg/dL   BUN 105 (H) 8 - 23 mg/dL    Comment: RESULT CONFIRMED BY MANUAL DILUTION.PMF   Creatinine, Ser 4.57 (H) 0.61 - 1.24 mg/dL   Calcium 9.4 8.9 - 10.3 mg/dL   GFR calc non Af Amer 13 (L) >60 mL/min   GFR calc Af Amer 15 (L) >60 mL/min   Anion gap 13 5 - 15    Comment: Performed at [facility], [address] [place], [place] [address]  Phosphorus     Status: Abnormal   Collection Time: 03/09/19  5:22 AM  Result Value Ref Range   Phosphorus 7.1 (H) 2.5 - 4.6 mg/dL    Comment: Performed at [facility], [address] [place], [place] [address]  Glucose, capillary     Status: Abnormal   Collection Time: 03/09/19  7:46 AM  Result Value Ref Range   Glucose-Capillary 175 (H) 70 - 99 mg/dL  Glucose, capillary     Status: Abnormal   Collection Time: 03/09/19 11:43 AM  Result Value  Ref Range   Glucose-Capillary 262 (H) 70 - 99 mg/dL  Glucose, capillary     Status: Abnormal   Collection Time: 03/21/19  7:35 AM  Result Value Ref Range   Glucose-Capillary 305 (H) 70 - 99 mg/dL  Glucose, capillary     Status: Abnormal   Collection Time: 03/21/19  8:36 AM  Result Value Ref Range   Glucose-Capillary 265 (H)  70 - 99 mg/dL  Glucose, capillary     Status: Abnormal   Collection Time: 03/23/19  7:43 AM  Result Value Ref Range   Glucose-Capillary 167 (H) 70 - 99 mg/dL  Glucose, capillary     Status: Abnormal   Collection Time: 03/23/19  8:44 AM  Result Value Ref Range   Glucose-Capillary 138 (H) 70 - 99 mg/dL  SARS CORONAVIRUS 2 (TAT 6-24 HRS) Nasopharyngeal Nasopharyngeal Swab     Status: None   Collection Time: 05/22/19  8:51 AM   Specimen: Nasopharyngeal Swab  Result Value Ref Range   SARS Coronavirus 2 NEGATIVE NEGATIVE    Comment: (NOTE) SARS-CoV-2 target nucleic acids are NOT DETECTED. The SARS-CoV-2 RNA is generally detectable in upper and lower respiratory specimens during the acute phase of infection. Negative results do not preclude SARS-CoV-2 infection, do not rule out co-infections with other pathogens, and should not be used as the sole basis for treatment or other patient management decisions. Negative results must be combined with clinical observations, patient history, and epidemiological information. The expected result is Negative. Fact Sheet for Patients: SugarRoll.be Fact Sheet for Healthcare Providers: https://www.woods-mathews.com/ This test is not yet approved or cleared by the [place] FDA and  has been authorized for detection and/or diagnosis of SARS-CoV-2 by FDA under an Emergency Use Authorization (EUA). This EUA will remain  in effect (meaning this test can be used) for the duration of the COVID-19 declaration under Section 56 4(b)(1) of the Act, 21 U.S.C. section 360bbb-3(b)(1), unless the authorization is terminated or revoked sooner. Performed at [facility], [place] [address], [place], [place] [address]   Potassium [facility] vascular lab only)     Status: None   Collection Time: 05/24/19  8:20 AM  Result Value Ref Range   Potassium [facility] vascular lab) 4.3 3.5 - 5.1    Comment: Performed at [facility], [address] [place], [place] [address]  Glucose, capillary     Status: None   Collection Time: 05/24/19  8:39 AM  Result Value Ref Range   Glucose-Capillary 90 70 - 99 mg/dL    Radiology PERIPHERAL VASCULAR CATHETERIZATION  Result Date: 05/24/2019 See op note   Assessment/Plan  Hypertension blood pressure control important in reducing the progression of atherosclerotic disease. On appropriate oral medications.   Diabetes mellitus with neuropathy (HCC) blood glucose control important in reducing the progression of atherosclerotic disease. Also, involved in wound healing. On appropriate medications.  HLD (hyperlipidemia) lipid control important in reducing the progression of atherosclerotic disease. Continue statin therapy   ESRD on dialysis [facility]) Recommend:  The patient has advanced renal disease but currently does not have adequate permanent dialysis access.  He has a PermCath in place, and it is currently working reasonably well he does feel better with dialysis treatments. The patient has elected to move forward with peritoneal dialysis. There is no history of prior abdominal operations other than laparoscopic cholecystectomy, thus this does not appear at this time to be a prohibitive situation.  This was also discussed.  Risk and benefits were reviewed  the patient.  Indications for the procedure were reviewed.  All questions were answered, the patient agrees to proceed with laparoscopic PD catheter insertion. We discussed the differences between peritoneal dialysis and hemodialysis.  We discussed the procedures of fistula and graft placement versus peritoneal dialysis.  Discussion included the treatment options for dialysis access and perioperative management.       Leotis Pain, MD  06/01/2019 11:50 AM    This note was created with Dragon medical transcription system.  Any errors from dictation are purely unintentional

## 2019-06-05 ENCOUNTER — Telehealth (INDEPENDENT_AMBULATORY_CARE_PROVIDER_SITE_OTHER): Payer: Self-pay

## 2019-06-05 NOTE — Telephone Encounter (Signed)
Spoke with the patient and he is scheduled for surgery with Dr. Lucky Cowboy on 06/21/19 for a PD cath placement. Patient will do a  pre-op phone call on 06/12/19 between 1-5 pm. Patient will do covid testing on 06/19/19 between 12:30-2:30 pm at the [facility]. Pre-surgical instructions were discussed and will be mailed.

## 2019-06-06 ENCOUNTER — Other Ambulatory Visit
Admission: RE | Admit: 2019-06-06 | Discharge: 2019-06-06 | Disposition: A | Discharge status: Home / Self Care | Payer: Commercial Managed Care - PPO | Source: Ambulatory Visit | Attending: Family | Admitting: Family

## 2019-06-06 DIAGNOSIS — Z20822 Contact with and (suspected) exposure to covid-19: Secondary | ICD-10-CM | POA: Insufficient documentation

## 2019-06-06 DIAGNOSIS — Z01812 Encounter for preprocedural laboratory examination: Secondary | ICD-10-CM | POA: Diagnosis not present

## 2019-06-06 LAB — SARS CORONAVIRUS 2 (TAT 6-24 HRS): SARS Coronavirus 2: NEGATIVE

## 2019-06-07 ENCOUNTER — Encounter: Payer: Commercial Managed Care - PPO | Attending: Internal Medicine

## 2019-06-07 DIAGNOSIS — Z87891 Personal history of nicotine dependence: Secondary | ICD-10-CM | POA: Insufficient documentation

## 2019-06-07 DIAGNOSIS — F329 Major depressive disorder, single episode, unspecified: Secondary | ICD-10-CM | POA: Insufficient documentation

## 2019-06-07 DIAGNOSIS — I130 Hypertensive heart and chronic kidney disease with heart failure and stage 1 through stage 4 chronic kidney disease, or unspecified chronic kidney disease: Secondary | ICD-10-CM | POA: Insufficient documentation

## 2019-06-07 DIAGNOSIS — Z7901 Long term (current) use of anticoagulants: Secondary | ICD-10-CM | POA: Insufficient documentation

## 2019-06-07 DIAGNOSIS — Z79899 Other long term (current) drug therapy: Secondary | ICD-10-CM | POA: Insufficient documentation

## 2019-06-07 DIAGNOSIS — K219 Gastro-esophageal reflux disease without esophagitis: Secondary | ICD-10-CM | POA: Insufficient documentation

## 2019-06-07 DIAGNOSIS — E039 Hypothyroidism, unspecified: Secondary | ICD-10-CM | POA: Insufficient documentation

## 2019-06-07 DIAGNOSIS — I509 Heart failure, unspecified: Secondary | ICD-10-CM | POA: Insufficient documentation

## 2019-06-07 DIAGNOSIS — N189 Chronic kidney disease, unspecified: Secondary | ICD-10-CM | POA: Insufficient documentation

## 2019-06-07 DIAGNOSIS — Z79890 Hormone replacement therapy (postmenopausal): Secondary | ICD-10-CM | POA: Insufficient documentation

## 2019-06-07 DIAGNOSIS — E1122 Type 2 diabetes mellitus with diabetic chronic kidney disease: Secondary | ICD-10-CM | POA: Insufficient documentation

## 2019-06-07 DIAGNOSIS — Z794 Long term (current) use of insulin: Secondary | ICD-10-CM | POA: Insufficient documentation

## 2019-06-07 DIAGNOSIS — M109 Gout, unspecified: Secondary | ICD-10-CM | POA: Insufficient documentation

## 2019-06-07 DIAGNOSIS — G4733 Obstructive sleep apnea (adult) (pediatric): Secondary | ICD-10-CM | POA: Insufficient documentation

## 2019-06-07 DIAGNOSIS — E785 Hyperlipidemia, unspecified: Secondary | ICD-10-CM | POA: Insufficient documentation

## 2019-06-08 ENCOUNTER — Ambulatory Visit: Payer: Commercial Managed Care - PPO | Attending: Pulmonary Disease

## 2019-06-08 DIAGNOSIS — E662 Morbid (severe) obesity with alveolar hypoventilation: Secondary | ICD-10-CM | POA: Diagnosis not present

## 2019-06-08 DIAGNOSIS — J9611 Chronic respiratory failure with hypoxia: Secondary | ICD-10-CM | POA: Diagnosis not present

## 2019-06-08 DIAGNOSIS — J449 Chronic obstructive pulmonary disease, unspecified: Secondary | ICD-10-CM | POA: Insufficient documentation

## 2019-06-08 DIAGNOSIS — R911 Solitary pulmonary nodule: Secondary | ICD-10-CM | POA: Diagnosis not present

## 2019-06-08 DIAGNOSIS — Z6841 Body Mass Index (BMI) 40.0 and over, adult: Secondary | ICD-10-CM | POA: Diagnosis not present

## 2019-06-08 DIAGNOSIS — G4733 Obstructive sleep apnea (adult) (pediatric): Secondary | ICD-10-CM | POA: Diagnosis present

## 2019-06-11 ENCOUNTER — Other Ambulatory Visit: Payer: Self-pay

## 2019-06-11 DIAGNOSIS — G4733 Obstructive sleep apnea (adult) (pediatric): Secondary | ICD-10-CM | POA: Diagnosis not present

## 2019-06-12 ENCOUNTER — Encounter
Admission: RE | Admit: 2019-06-12 | Discharge: 2019-06-12 | Disposition: A | Discharge status: Home / Self Care | Payer: Commercial Managed Care - PPO | Source: Ambulatory Visit | Attending: Vascular Surgery | Admitting: Vascular Surgery

## 2019-06-12 ENCOUNTER — Other Ambulatory Visit: Payer: Self-pay

## 2019-06-12 ENCOUNTER — Other Ambulatory Visit (INDEPENDENT_AMBULATORY_CARE_PROVIDER_SITE_OTHER): Payer: Self-pay | Admitting: Nurse Practitioner

## 2019-06-12 ENCOUNTER — Encounter: Payer: Self-pay | Admitting: *Deleted

## 2019-06-12 ENCOUNTER — Telehealth: Payer: Self-pay | Admitting: *Deleted

## 2019-06-12 DIAGNOSIS — G4733 Obstructive sleep apnea (adult) (pediatric): Secondary | ICD-10-CM

## 2019-06-12 HISTORY — DX: Sleep apnea, unspecified: G47.30

## 2019-06-12 HISTORY — DX: Dependence on supplemental oxygen: Z99.81

## 2019-06-12 NOTE — Progress Notes (Signed)
Pulmonary Individual Treatment Plan  Patient Details  Name: Patrick Hurst MRN: [id number] Date of Birth: 04-29-1956 Referring Provider:     Pulmonary Rehab from 03/20/2019 in [facility] Cardiac and Pulmonary Rehab  Referring Provider  Lujean Amel MD      Initial Encounter Date:    Pulmonary Rehab from 03/20/2019 in [facility] Cardiac and Pulmonary Rehab  Date  03/20/19      Visit Diagnosis: OSA (obstructive sleep apnea)  Patient's Home Medications on Admission:  Current Outpatient Medications:  .  albuterol (VENTOLIN HFA) 108 (90 Base) MCG/ACT inhaler, USE 2 INHALATIONS EVERY 4 HOURS AS NEEDED FOR WHEEZING OR SHORTNESS OF BREATH, Disp: 8.5 g, Rfl: 10 .  amiodarone (PACERONE) 200 MG tablet, Take 400 mg (two tabs) twice daily for 5 more days, then take 200 mg (1 tab) daily, Disp: 45 tablet, Rfl: 0 .  amitriptyline (ELAVIL) 25 MG tablet, Take 1 tablet (25 mg total) by mouth at bedtime., Disp: 90 tablet, Rfl: 3 .  amLODipine (NORVASC) 5 MG tablet, Take 1 tablet (5 mg total) by mouth daily., Disp: 30 tablet, Rfl: 3 .  Ascorbic Acid (VITAMIN C) 1000 MG tablet, Take 1,000 mg by mouth 2 (two) times daily. , Disp: , Rfl:  .  atorvastatin (LIPITOR) 40 MG tablet, TAKE 1 TABLET DAILY, Disp: 90 tablet, Rfl: 0 .  calcitRIOL (ROCALTROL) 0.5 MCG capsule, Take 0.5 mcg by mouth daily., Disp: , Rfl:  .  cetirizine (ZYRTEC) 10 MG tablet, Take 10 mg by mouth daily. , Disp: , Rfl:  .  doxycycline (VIBRA-TABS) 100 MG tablet, Take 1 tablet (100 mg total) by mouth 2 (two) times daily. (Patient not taking: Reported on 05/24/2019), Disp: 14 tablet, Rfl: 0 .  ELIQUIS 2.5 MG TABS tablet, SMARTSIG:1 Tablet(s) By Mouth Every 12 Hours, Disp: , Rfl:  .  fluticasone furoate-vilanterol (BREO ELLIPTA) 100-25 MCG/INH AEPB, Inhale 1 puff into the lungs daily., Disp: 180 each, Rfl: 3 .  hydrALAZINE (APRESOLINE) 25 MG tablet, TAKE 1 TABLET THREE TIMES A DAY, Disp: 270 tablet, Rfl: 4 .  insulin regular human CONCENTRATED (HUMULIN  R) 500 UNIT/ML injection, Inject up to 25 units twice a day, or as directed by physician, Disp: 60 mL, Rfl: 3 .  ipratropium-albuterol (DUONEB) 0.5-2.5 (3) MG/3ML SOLN, INHALE 3 ML BY NEBULIZATION EVERY 4 HOURS AS NEEDED, Disp: 360 mL, Rfl: 8 .  levothyroxine (SYNTHROID) 100 MCG tablet, Take 1 tablet (100 mcg total) by mouth daily., Disp: 90 tablet, Rfl: 3 .  metoprolol tartrate (LOPRESSOR) 50 MG tablet, Take 0.5 tablets (25 mg total) by mouth 2 (two) times daily., Disp: , Rfl:  .  Misc. Devices (PULSE OXIMETER FOR FINGER) MISC, 1 Device by Does not apply route daily as needed., Disp: 1 each, Rfl: 0 .  pantoprazole (PROTONIX) 40 MG tablet, TAKE 1 TABLET TWICE A DAY, Disp: 180 tablet, Rfl: 3 .  testosterone cypionate (DEPO-TESTOSTERONE) 200 MG/ML injection, Inject 1 mL (200 mg total) into the muscle every 14 (fourteen) days., Disp: 6 mL, Rfl: 1 .  ULORIC 80 MG TABS, Take 80 mg by mouth daily. , Disp: , Rfl:  .  vitamin B-12 (CYANOCOBALAMIN) 1000 MCG tablet, Take 1,000 mcg by mouth daily., Disp: , Rfl:   Past Medical History: Past Medical History:  Diagnosis Date  . Allergy   . Anemia   . Arthritis   . Bell's palsy   . CHF (congestive heart failure) ([place])   . CKD (chronic kidney disease)   . Depression   .  Diabetes ([place])   . Gastric ulcer   . GERD (gastroesophageal reflux disease)   . Gout   . History of chicken pox   . Hyperlipidemia   . Hypertension   . Hypothyroidism   . OSA (obstructive sleep apnea)   . Thyroid disease   . Vitamin D deficiency     Tobacco Use: Social History   Tobacco Use  Smoking Status Former Smoker  . Packs/day: 1.00  . Years: 15.00  . Pack years: 15.00  . Types: Cigarettes  . Quit date: 05/03/1978  . Years since quitting: 41.1  Smokeless Tobacco Former Systems developer  Tobacco Comment   started smoking at age 64    Labs: Recent Review [facility] Cardiac and Pulmonary Re[facility] Latest Ref Rng & Units 11/18/2017 02/20/2018 06/21/2018 03/02/2019  03/03/2019   Cholestrol 100 - 199 mg/dL 125 101 - - -   LDLCALC 0 - 99 mg/dL Comment 35 - - -   HDL >39 mg/dL 20(L) 24(L) - - -   Trlycerides 0 - 149 mg/dL 449(H) 208(H) - - -   Hemoglobin A1c 4.8 - 5.6 % 5.1 5.6 - 6.1(H) -   PHART 7.350 - 7.450 - - - - 7.38   PCO2ART 32.0 - 48.0 mmHg - - - - 37   HCO3 20.0 - 28.0 mmol/L - - 24.8 - 21.9   ACIDBASEDEF 0.0 - 2.0 mmol/L - - 1.1 - 2.8(H)   O2SAT % - - 70.4 - 92.3       Pulmonary Assessment Scores: Pulmonary Assessment Scores    Row Name 03/20/19 1228         ADL UCSD   ADL Phase  Entry     SOB Score total  16     Rest  0     Walk  1     Stairs  3     Bath  0     Dress  0     Shop  1       CAT Score   CAT Score  12       mMRC Score   mMRC Score  1        UCSD: Self-administered rating of dyspnea associated with activities of daily living (ADLs) 6-point scale (0 = "not at all" to 5 = "maximal or unable to do because of breathlessness")  Scoring Scores range from 0 to 120.  Minimally important difference is 5 units  CAT: CAT can identify the health impairment of COPD patients and is better correlated with disease progression.  CAT has a scoring range of zero to 40. The CAT score is classified into four groups of low (less than 10), medium (10 - 20), high (21-30) and very high (31-40) based on the impact level of disease on health status. A CAT score over 10 suggests significant symptoms.  A worsening CAT score could be explained by an exacerbation, poor medication adherence, poor inhaler technique, or progression of COPD or comorbid conditions.  CAT MCID is 2 points  mMRC: mMRC (Modified Medical Research Council) Dyspnea Scale is used to assess the degree of baseline functional disability in patients of respiratory disease due to dyspnea. No minimal important difference is established. A decrease in score of 1 point or greater is considered a positive change.   Pulmonary Function Assessment: Pulmonary Function Assessment  - 03/19/19 1204      Breath   Shortness of Breath  Yes;Panic with Shortness of Breath;Limiting  activity       Exercise Target Goals: Exercise Program Goal: Individual exercise prescription set using results from initial 6 min walk test and THRR while considering  patient's activity barriers and safety.   Exercise Prescription Goal: Initial exercise prescription builds to 30-45 minutes a day of aerobic activity, 2-3 days per week.  Home exercise guidelines will be given to patient during program as part of exercise prescription that the participant will acknowledge.  Activity Barriers & Risk Stratification: Activity Barriers & Cardiac Risk Stratification - 03/20/19 1219      Activity Barriers & Cardiac Risk Stratification   Activity Barriers  Arthritis;Joint Problems;Deconditioning;Muscular Weakness;Shortness of Breath;Balance Concerns       6 Minute Walk: 6 Minute Walk    Row Name 03/20/19 1215         6 Minute Walk   Phase  Initial     Distance  1060 feet     Walk Time  6 minutes     # of Rest Breaks  0     MPH  2.08     METS  2.72     RPE  8     Perceived Dyspnea   1     VO2 Peak  9.51     Symptoms  Yes (comment)     Comments  knee pain 5/10     Resting HR  67 bpm     Resting BP  152/74     Resting Oxygen Saturation   94 %     Exercise Oxygen Saturation  during 6 min walk  89 %     Max Ex. HR  109 bpm     Max Ex. BP  184/62     2 Minute Post BP  162/84       Interval HR   1 Minute HR  109     2 Minute HR  100     3 Minute HR  89     4 Minute HR  90     5 Minute HR  104     6 Minute HR  104     2 Minute Post HR  84     Interval Heart Rate?  Yes       Interval Oxygen   Interval Oxygen?  Yes     Baseline Oxygen Saturation %  94 %     1 Minute Oxygen Saturation %  91 %     1 Minute Liters of Oxygen  0 L Room Air     2 Minute Oxygen Saturation %  89 %     2 Minute Liters of Oxygen  0 L     3 Minute Oxygen Saturation %  89 %     3 Minute Liters of Oxygen   0 L     4 Minute Oxygen Saturation %  89 %     4 Minute Liters of Oxygen  0 L     5 Minute Oxygen Saturation %  91 %     5 Minute Liters of Oxygen  0 L     6 Minute Oxygen Saturation %  97 %     6 Minute Liters of Oxygen  0 L     2 Minute Post Oxygen Saturation %  96 %     2 Minute Post Liters of Oxygen  0 L       Oxygen Initial Assessment: Oxygen Initial Assessment - 03/19/19 1202  Home Oxygen   Home Oxygen Device  Home Concentrator    Sleep Oxygen Prescription  BiPAP    Liters per minute  2    Home Exercise Oxygen Prescription  None    Home at Rest Exercise Oxygen Prescription  None    Compliance with Home Oxygen Use  Yes      Initial 6 min Walk   Oxygen Used  None      Program Oxygen Prescription   Program Oxygen Prescription  None      Intervention   Short Term Goals  To learn and exhibit compliance with exercise, home and travel O2 prescription;To learn and understand importance of monitoring SPO2 with pulse oximeter and demonstrate accurate use of the pulse oximeter.;To learn and understand importance of maintaining oxygen saturations>88%;To learn and demonstrate proper pursed lip breathing techniques or other breathing techniques.;To learn and demonstrate proper use of respiratory medications    Long  Term Goals  Exhibits compliance with exercise, home and travel O2 prescription;Verbalizes importance of monitoring SPO2 with pulse oximeter and return demonstration;Exhibits proper breathing techniques, such as pursed lip breathing or other method taught during program session;Maintenance of O2 saturations>88%;Demonstrates proper use of MDI's;Compliance with respiratory medication       Oxygen Re-Evaluation: Oxygen Re-Evaluation    Row Name 04/11/19 0844             Program Oxygen Prescription   Program Oxygen Prescription  None         Home Oxygen   Home Oxygen Device  Home Concentrator       Sleep Oxygen Prescription  BiPAP       Liters per minute  2        Home Exercise Oxygen Prescription  None       Home at Rest Exercise Oxygen Prescription  None       Compliance with Home Oxygen Use  Yes         Goals/Expected Outcomes   Short Term Goals  To learn and exhibit compliance with exercise, home and travel O2 prescription;To learn and understand importance of monitoring SPO2 with pulse oximeter and demonstrate accurate use of the pulse oximeter.;To learn and understand importance of maintaining oxygen saturations>88%;To learn and demonstrate proper pursed lip breathing techniques or other breathing techniques.;To learn and demonstrate proper use of respiratory medications       Long  Term Goals  Exhibits compliance with exercise, home and travel O2 prescription;Verbalizes importance of monitoring SPO2 with pulse oximeter and return demonstration;Maintenance of O2 saturations>88%;Exhibits proper breathing techniques, such as pursed lip breathing or other method taught during program session;Compliance with respiratory medication;Demonstrates proper use of MDI's       Comments  Practiced PLB with patient. Patient understands why PLB is important and to use it when he is short of breath. Reviewed that oxygen saturations should be 88 percent and above. Patient has a pulse oximeter at home to check his oxygen.       Goals/Expected Outcomes  Short: use PLB with exertion. Long: use PLB on exertion proficiently and independently.          Oxygen Discharge (Final Oxygen Re-Evaluation): Oxygen Re-Evaluation - 04/11/19 0844      Program Oxygen Prescription   Program Oxygen Prescription  None      Home Oxygen   Home Oxygen Device  Home Concentrator    Sleep Oxygen Prescription  BiPAP    Liters per minute  2    Home Exercise Oxygen Prescription  None    Home at Rest Exercise Oxygen Prescription  None    Compliance with Home Oxygen Use  Yes      Goals/Expected Outcomes   Short Term Goals  To learn and exhibit compliance with exercise, home and travel O2  prescription;To learn and understand importance of monitoring SPO2 with pulse oximeter and demonstrate accurate use of the pulse oximeter.;To learn and understand importance of maintaining oxygen saturations>88%;To learn and demonstrate proper pursed lip breathing techniques or other breathing techniques.;To learn and demonstrate proper use of respiratory medications    Long  Term Goals  Exhibits compliance with exercise, home and travel O2 prescription;Verbalizes importance of monitoring SPO2 with pulse oximeter and return demonstration;Maintenance of O2 saturations>88%;Exhibits proper breathing techniques, such as pursed lip breathing or other method taught during program session;Compliance with respiratory medication;Demonstrates proper use of MDI's    Comments  Practiced PLB with patient. Patient understands why PLB is important and to use it when he is short of breath. Reviewed that oxygen saturations should be 88 percent and above. Patient has a pulse oximeter at home to check his oxygen.    Goals/Expected Outcomes  Short: use PLB with exertion. Long: use PLB on exertion proficiently and independently.       Initial Exercise Prescription: Initial Exercise Prescription - 03/20/19 1200      Date of Initial Exercise RX and Referring Provider   Date  03/20/19    Referring Provider  Lujean Amel MD      Treadmill   MPH  2    Grade  0    Minutes  15    METs  2.53      NuStep   Level  2    SPM  80    Minutes  15    METs  2.5      Recumbant Elliptical   Level  1    RPM  50    Minutes  15    METs  2.5      Prescription Details   Frequency (times per week)  3    Duration  Progress to 30 minutes of continuous aerobic without signs/symptoms of physical distress      Intensity   THRR 40-80% of Max Heartrate  104-141    Ratings of Perceived Exertion  11-13    Perceived Dyspnea  0-4      Progression   Progression  Continue to progress workloads to maintain intensity without  signs/symptoms of physical distress.      Resistance Training   Training Prescription  Yes    Weight  3 lb    Reps  10-15       Perform Capillary Blood Glucose checks as needed.  Exercise Prescription Changes: Exercise Prescription Changes    Row Name 03/20/19 1200 03/28/19 0700 04/05/19 1100 04/18/19 1100 05/03/19 1300     Response to Exercise   Blood Pressure (Admit)  152/74  --  160/54  148/68  172/80   Blood Pressure (Exercise)  184/62  --  184/62  168/66  182/68   Blood Pressure (Exit)  162/84  --  154/58  150/66  150/78   Heart Rate (Admit)  67 bpm  --  75 bpm  103 bpm  66 bpm   Heart Rate (Exercise)  109 bpm  --  82 bpm  129 bpm  101 bpm   Heart Rate (Exit)  66 bpm  --  73 bpm  77 bpm  72 bpm   Oxygen  Saturation (Admit)  94 %  --  92 %  92 %  93 %   Oxygen Saturation (Exercise)  89 %  --  92 %  90 %  89 %   Oxygen Saturation (Exit)  94 %  --  94 %  94 %  92 %   Rating of Perceived Exertion (Exercise)  8  --  '13  13  13   ' Perceived Dyspnea (Exercise)  1  --  --  1  2   Symptoms  knee pain 5/10  --  --  none  none   Comments  walk test results  --  --  --  --   Duration  --  --  Continue with 30 min of aerobic exercise without signs/symptoms of physical distress.  Continue with 30 min of aerobic exercise without signs/symptoms of physical distress.  Continue with 30 min of aerobic exercise without signs/symptoms of physical distress.   Intensity  --  --  THRR unchanged  THRR unchanged  THRR unchanged     Progression   Progression  --  --  Continue to progress workloads to maintain intensity without signs/symptoms of physical distress.  Continue to progress workloads to maintain intensity without signs/symptoms of physical distress.  Continue to progress workloads to maintain intensity without signs/symptoms of physical distress.   Average METs  --  --  2.72  2.75  2.82     Resistance Training   Training Prescription  --  --  Yes  Yes  Yes   Weight  --  --  4 lb  4 lb  4 lb    Reps  --  --  10-15  10-15  10-15     Interval Training   Interval Training  --  --  No  No  No     Treadmill   MPH  --  --  2  --  1.6   Grade  --  --  0  --  0   Minutes  --  --  15  --  15   METs  --  --  2.53  --  2.23     NuStep   Level  --  --  '3  3  3   ' SPM  --  --  80  --  80   Minutes  --  --  '15  15  15   ' METs  --  --  3  3.2  3.4     Recumbant Elliptical   Level  --  --  3  3  --   RPM  --  --  50  --  --   Minutes  --  --  15  15  --   METs  --  --  2.3  2.3  --     Home Exercise Plan   Plans to continue exercise at  --  Home (comment)  Home (comment)  Home (comment)  Home (comment)   Frequency  --  Add 1 additional day to program exercise sessions.  Add 1 additional day to program exercise sessions.  Add 1 additional day to program exercise sessions.  Add 1 additional day to program exercise sessions.   Initial Home Exercises Provided  --  03/28/19  03/28/19  03/28/19  03/28/19   Row Name 05/17/19 1600             Response to Exercise   Blood Pressure (Admit)  132/84  Blood Pressure (Exercise)  126/74       Blood Pressure (Exit)  138/74       Heart Rate (Admit)  71 bpm       Heart Rate (Exercise)  90 bpm       Heart Rate (Exit)  82 bpm       Oxygen Saturation (Admit)  92 %       Oxygen Saturation (Exercise)  85 %       Oxygen Saturation (Exit)  88 %       Rating of Perceived Exertion (Exercise)  13       Perceived Dyspnea (Exercise)  3       Symptoms  SOB       Duration  Continue with 30 min of aerobic exercise without signs/symptoms of physical distress.       Intensity  THRR unchanged         Progression   Progression  Continue to progress workloads to maintain intensity without signs/symptoms of physical distress.       Average METs  2.7         Resistance Training   Training Prescription  Yes       Weight  3 lb       Reps  10-15         Interval Training   Interval Training  No         NuStep   Level  3       Minutes  15        METs  3.1         Recumbant Elliptical   Level  3       Minutes  15       METs  2.3         Home Exercise Plan   Plans to continue exercise at  Home (comment)       Frequency  Add 1 additional day to program exercise sessions.       Initial Home Exercises Provided  03/28/19          Exercise Comments: Exercise Comments    Row Name 03/21/19 0746 03/26/19 0741 03/28/19 0736 04/20/19 0915 04/20/19 0935   Exercise Comments  First full day of exercise!  Patient was oriented to gym and equipment including functions, settings, policies, and procedures.  Patient's individual exercise prescription and treatment plan were reviewed.  All starting workloads were established based on the results of the 6 minute walk test done at initial orientation visit.  The plan for exercise progression was also introduced and progression will be customized based on patient's performance and goals.  Weight up 2 pounds. Fluid pill stopped last week.  Has appt with MD on Wed this week.  He will call MD if weight continues to increase.  Weight up. Has appt with Kidney MD today and plans to talk about weight gain.Had called office and no response as of this AM  Noticed weight has been rising since admission. Jemmie has appointment at [facility] today and has been given his exercise Log to share all session weights.  Note From [facility] today FYI: Dr. [name] (nephrology) had stopped his torsemide due to worsening GFR. Labs were checked on 12/16 but results aren't in. In Singh's note, he said patient could take torsemide as needed but he was waiting on the lab results first. I advised the patient to call Singh's officer later today if he hasn't heard anything  so he would know if he could resume the torsemide.      Exercise Goals and Review: Exercise Goals    Row Name 03/20/19 1222             Exercise Goals   Increase Physical Activity  Yes       Intervention  Provide advice, education, support  and counseling about physical activity/exercise needs.;Develop an individualized exercise prescription for aerobic and resistive training based on initial evaluation findings, risk stratification, comorbidities and participant's personal goals.       Expected Outcomes  Short Term: Attend rehab on a regular basis to increase amount of physical activity.;Long Term: Add in home exercise to make exercise part of routine and to increase amount of physical activity.;Long Term: Exercising regularly at least 3-5 days a week.       Increase Strength and Stamina  Yes       Intervention  Provide advice, education, support and counseling about physical activity/exercise needs.;Develop an individualized exercise prescription for aerobic and resistive training based on initial evaluation findings, risk stratification, comorbidities and participant's personal goals.       Expected Outcomes  Short Term: Increase workloads from initial exercise prescription for resistance, speed, and METs.;Short Term: Perform resistance training exercises routinely during rehab and add in resistance training at home;Long Term: Improve cardiorespiratory fitness, muscular endurance and strength as measured by increased METs and functional capacity (6MWT)       Able to understand and use rate of perceived exertion (RPE) scale  Yes       Intervention  Provide education and explanation on how to use RPE scale       Expected Outcomes  Short Term: Able to use RPE daily in rehab to express subjective intensity level;Long Term:  Able to use RPE to guide intensity level when exercising independently       Knowledge and understanding of Target Heart Rate Range (THRR)  Yes       Intervention  Provide education and explanation of THRR including how the numbers were predicted and where they are located for reference       Expected Outcomes  Short Term: Able to state/look up THRR;Short Term: Able to use daily as guideline for intensity in rehab;Long  Term: Able to use THRR to govern intensity when exercising independently       Able to check pulse independently  Yes       Intervention  Provide education and demonstration on how to check pulse in carotid and radial arteries.;Review the importance of being able to check your own pulse for safety during independent exercise       Expected Outcomes  Short Term: Able to explain why pulse checking is important during independent exercise;Long Term: Able to check pulse independently and accurately       Understanding of Exercise Prescription  Yes       Intervention  Provide education, explanation, and written materials on patient's individual exercise prescription       Expected Outcomes  Short Term: Able to explain program exercise prescription;Long Term: Able to explain home exercise prescription to exercise independently          Exercise Goals Re-Evaluation : Exercise Goals Re-Evaluation    Row Name 03/21/19 0746 03/28/19 0753 04/05/19 1207 04/18/19 1117 05/03/19 1306     Exercise Goal Re-Evaluation   Exercise Goals Review  Increase Physical Activity;Able to understand and use rate of perceived exertion (RPE) scale;Knowledge and understanding of Target Heart  Rate Range (THRR);Understanding of Exercise Prescription;Able to understand and use Dyspnea scale  Increase Physical Activity;Increase Strength and Stamina;Able to understand and use rate of perceived exertion (RPE) scale;Able to understand and use Dyspnea scale;Knowledge and understanding of Target Heart Rate Range (THRR);Able to check pulse independently;Understanding of Exercise Prescription  Increase Physical Activity;Increase Strength and Stamina;Able to understand and use rate of perceived exertion (RPE) scale;Able to understand and use Dyspnea scale;Knowledge and understanding of Target Heart Rate Range (THRR);Able to check pulse independently;Understanding of Exercise Prescription  Increase Physical Activity;Increase Strength and  Stamina;Understanding of Exercise Prescription  Increase Physical Activity;Increase Strength and Stamina;Able to understand and use rate of perceived exertion (RPE) scale;Able to understand and use Dyspnea scale;Knowledge and understanding of Target Heart Rate Range (THRR);Able to check pulse independently;Understanding of Exercise Prescription   Comments  Reviewed RPE scale, THR and program prescription with pt today.  Pt voiced understanding and was given a copy of goals to take home.  Reviewed home exercise with pt today.  Pt plans to use TM at home for exercise.  Reviewed THR, pulse, RPE, sign and symptoms, NTG use, and when to call 911 or MD.  Also discussed weather considerations and indoor options.  Pt voiced understanding.  Catherine is doing well with exercise.  He has increased levels on most machines and his oxygen stays above 90.  He has increased to 4 lb weights  Ansley continues to do well in rehab.  He is already up to level 3 on the NuStep and XR.  We will continue to monitor his progress.  Ravinder is trying the TM.  It is hard for him and he is working towards being able to walk for longer duration.   Expected Outcomes  Short: Use RPE daily to regulate intensity. Long: Follow program prescription in [facility].  Short - unpack TM at home to get ready to use Long - exercise at home on days not at [place] - attend LW consistently Long - increase stamina overall  Short: Continue to improve workloads.  Long: Continue to improve stamina.  Short - continue to increase time on TM Long : walk 20 min on TM without stopping   Row Name 05/17/19 1638 05/29/19 1103           Exercise Goal Re-Evaluation   Exercise Goals Review  Increase Physical Activity;Increase Strength and Stamina;Understanding of Exercise Prescription  --      Comments  Kalin has been doing well in rehab.  This week his sats were running in the high 80s for exercise.  Last week he had had a rough week and was having blood pressure issues.  We  will continue to montior his progress.  Out since last review.      Expected Outcomes  Short: Continue to work on Personal assistant for exericse.  Long: Continue to improve stamina for walking on treadmill.  --         Discharge Exercise Prescription (Final Exercise Prescription Changes): Exercise Prescription Changes - 05/17/19 1600      Response to Exercise   Blood Pressure (Admit)  132/84    Blood Pressure (Exercise)  126/74    Blood Pressure (Exit)  138/74    Heart Rate (Admit)  71 bpm    Heart Rate (Exercise)  90 bpm    Heart Rate (Exit)  82 bpm    Oxygen Saturation (Admit)  92 %    Oxygen Saturation (Exercise)  85 %    Oxygen Saturation (Exit)  88 %    Rating of Perceived Exertion (Exercise)  13    Perceived Dyspnea (Exercise)  3    Symptoms  SOB    Duration  Continue with 30 min of aerobic exercise without signs/symptoms of physical distress.    Intensity  THRR unchanged      Progression   Progression  Continue to progress workloads to maintain intensity without signs/symptoms of physical distress.    Average METs  2.7      Resistance Training   Training Prescription  Yes    Weight  3 lb    Reps  10-15      Interval Training   Interval Training  No      NuStep   Level  3    Minutes  15    METs  3.1      Recumbant Elliptical   Level  3    Minutes  15    METs  2.3      Home Exercise Plan   Plans to continue exercise at  Home (comment)    Frequency  Add 1 additional day to program exercise sessions.    Initial Home Exercises Provided  03/28/19       Nutrition:  Target Goals: Understanding of nutrition guidelines, daily intake of sodium <1585m, cholesterol <2070m calories 30% from fat and 7% or less from saturated fats, daily to have 5 or more servings of fruits and vegetables.  Biometrics: Pre Biometrics - 03/20/19 1225      Pre Biometrics   Height  5' 10.6" (1.793 m)    Weight  278 lb 9.6 oz (126.4 kg)    BMI (Calculated)  39.31    Single Leg Stand   1.31 seconds        Nutrition Therapy Plan and Nutrition Goals: Nutrition Therapy & Goals - 04/03/19 0914      Nutrition Therapy   Diet  Renal, HH, DM, Low Na diet    Drug/Food Interactions  Purine/Gout    Protein (specify units)  100g    Fiber  30 grams    Whole Grain Foods  3 servings    Saturated Fats  12 max. grams    Fruits and Vegetables  5 servings/day    Sodium  1.5 grams      Personal Nutrition Goals   Nutrition Goal  ST: continue with current changes LT: Lose weight    Comments  Not on dialysis. Renal doctor 2-3 months (blood work 1x/month): pt reports improved kidney function at 13%; pt reports that renal MD stated that if he says at 13% he does not need dialysis.  A1c 6.1%. 4-6 oz of coffee. Lunch 12 oz of water. Afternoon 16 oz. Dinner [id number]: 2 packets sugar free oatmeal w/ cup of fruit. L: sometimes: sandwich (chicken salad) with cup of fruit. D: meat w/ two vegetables (chicken baked in oven) variety of vegetables. Uses airfyer. Discussed DM, renal, HH eating. Discussed set point weight range and health outcomes. Pt would not like to make any goals at this time. Pt reports edema with some pitting, not on fluid pill.      Intervention Plan   Intervention  Prescribe, educate and counsel regarding individualized specific dietary modifications aiming towards targeted core components such as weight, hypertension, lipid management, diabetes, heart failure and other comorbidities.;Nutrition handout(s) given to patient.    Expected Outcomes  Short Term Goal: Understand basic principles of dietary content, such as calories, fat, sodium, cholesterol and nutrients.;Short Term  Goal: A plan has been developed with personal nutrition goals set during dietitian appointment.;Long Term Goal: Adherence to prescribed nutrition plan.       Nutrition Assessments: Nutrition Assessments - 03/20/19 1226      MEDFICTS Scores   Pre Score  60       Nutrition Goals Re-Evaluation: Nutrition  Goals Re-Evaluation    Row Name 05/10/19 0822             Goals   Nutrition Goal  ST: continue with current changes LT: Lose weight       Comment  Pt reports no concerns at this current time, would like to continue with what he is doing.       Expected Outcome  ST: continue with current changes LT: Lose weight          Nutrition Goals Discharge (Final Nutrition Goals Re-Evaluation): Nutrition Goals Re-Evaluation - 05/10/19 7588      Goals   Nutrition Goal  ST: continue with current changes LT: Lose weight    Comment  Pt reports no concerns at this current time, would like to continue with what he is doing.    Expected Outcome  ST: continue with current changes LT: Lose weight       Psychosocial: Target Goals: Acknowledge presence or absence of significant depression and/or stress, maximize coping skills, provide positive support system. Participant is able to verbalize types and ability to use techniques and skills needed for reducing stress and depression.   Initial Review & Psychosocial Screening: Initial Psych Review & Screening - 03/19/19 1204      Initial Review   Current issues with  None Identified      Family Dynamics   Good Support System?  Yes    Comments  He can look to his wife, two sons and grand children for supprort. He states he has a positive outlook on life in genral.      Barriers   Psychosocial barriers to participate in program  The patient should benefit from training in stress management and relaxation.      Screening Interventions   Interventions  To provide support and resources with identified psychosocial needs;Provide feedback about the scores to participant;Encouraged to exercise    Expected Outcomes  Short Term goal: Identification and review with participant of any Quality of Life or Depression concerns found by scoring the questionnaire.;Short Term goal: Utilizing psychosocial counselor, staff and physician to assist with identification of  specific Stressors or current issues interfering with healing process. Setting desired goal for each stressor or current issue identified.;Long Term Goal: Stressors or current issues are controlled or eliminated.;Long Term goal: The participant improves quality of Life and PHQ9 Scores as seen by post scores and/or verbalization of changes       Quality of Life Scores:  Scores of 19 and below usually indicate a poorer quality of life in these areas.  A difference of  2-3 points is a clinically meaningful difference.  A difference of 2-3 points in the total score of the Quality of Life Index has been associated with significant improvement in overall quality of life, self-image, physical symptoms, and general health in studies assessing change in quality of life.  PHQ-9: Recent Review Flowsheet Data    Depression screen [facility] 2/9 04/18/2019 03/20/2019 11/18/2017 12/08/2016 02/16/2016   Decreased Interest '3 3 1 ' 0 1   Down, Depressed, Hopeless 0 0 1 0 1   PHQ - 2 Score '3 3 2 ' 0 2  Altered sleeping 0 0 0 0 2   Tired, decreased energy '1 3 3 1 3   ' Change in appetite 1 0 0 1 2   Feeling bad or failure about yourself  0 0 0 0 1   Trouble concentrating 0 0 0 0 1   Moving slowly or fidgety/restless 0 0 0 0 1   Suicidal thoughts 0 0 0 0 0   PHQ-9 Score '5 6 5 2 12   ' Difficult doing work/chores Somewhat difficult Somewhat difficult Not difficult at all Somewhat difficult Somewhat difficult     Interpretation of Total Score  Total Score Depression Severity:  1-4 = Minimal depression, 5-9 = Mild depression, 10-14 = Moderate depression, 15-19 = Moderately severe depression, 20-27 = Severe depression   Psychosocial Evaluation and Intervention:   Psychosocial Re-Evaluation: Psychosocial Re-Evaluation    Row Name 04/11/19 [phone] 05/10/19 2119           Psychosocial Re-Evaluation   Current issues with  Current Stress Concerns  Current Stress Concerns      Comments  Patient has shortness of breath and  that can stress him out. He has a  positive attitude and is willing to make changes for his health and mental health.  Kyley came in today with a high blood pressure.  After talking with him, he has been under a lot of stress over that last couple of weeks.  His brother in law passed and they had the funeral on Tuesday.  His health and breathing have been off too.  He just has not been feeling the best.  He has also not been sleeping as well.  He is going to use exercise as a stress outlet to help with breathing and weight control.      Expected Outcomes  Short: Attend [facility] stress management education to decrease stress. Long: Maintain exercise Post [facility] to keep stress at a minimum.  Short: Continue to attend rehab as stress relief.  Long: Continue to find positive coping skills.      Interventions  Encouraged to attend Pulmonary Rehabilitation for the exercise  Encouraged to attend Pulmonary Rehabilitation for the exercise;Stress management education;Relaxation education      Continue Psychosocial Services   Follow up required by staff  Follow up required by staff         Psychosocial Discharge (Final Psychosocial Re-Evaluation): Psychosocial Re-Evaluation - 05/10/19 4174      Psychosocial Re-Evaluation   Current issues with  Current Stress Concerns    Comments  Alazar came in today with a high blood pressure.  After talking with him, he has been under a lot of stress over that last couple of weeks.  His brother in law passed and they had the funeral on Tuesday.  His health and breathing have been off too.  He just has not been feeling the best.  He has also not been sleeping as well.  He is going to use exercise as a stress outlet to help with breathing and weight control.    Expected Outcomes  Short: Continue to attend rehab as stress relief.  Long: Continue to find positive coping skills.    Interventions  Encouraged to attend Pulmonary Rehabilitation for the exercise;Stress management  education;Relaxation education    Continue Psychosocial Services   Follow up required by staff       Education: Education Goals: Education classes will be provided on a weekly basis, covering required topics. Participant will state understanding/return demonstration of topics presented.  Learning Barriers/Preferences: Learning Barriers/Preferences - 03/19/19 1206      Learning Barriers/Preferences   Learning Barriers  None    Learning Preferences  None       Education Topics:  Initial Evaluation Education: - Verbal, written and demonstration of respiratory meds, oximetry and breathing techniques. Instruction on use of nebulizers and MDIs and importance of monitoring MDI activations.   Pulmonary Rehab from 03/21/2019 in [facility] Cardiac and Pulmonary Rehab  Date  03/19/19  Educator  [facility]  Instruction Review Code  1- Verbalizes Understanding      General Nutrition Guidelines/Fats and Fiber: -Group instruction provided by verbal, written material, models and posters to present the general guidelines for heart healthy nutrition. Gives an explanation and review of dietary fats and fiber.   Pulmonary Rehab from 03/21/2019 in [facility] Cardiac and Pulmonary Rehab  Date  03/21/19  Educator  [facility]  Instruction Review Code  1- Verbalizes Understanding      Controlling Sodium/Reading Food Labels: -Group verbal and written material supporting the discussion of sodium use in heart healthy nutrition. Review and explanation with models, verbal and written materials for utilization of the food label.   Exercise Physiology & General Exercise Guidelines: - Group verbal and written instruction with models to review the exercise physiology of the cardiovascular system and associated critical values. Provides general exercise guidelines with specific guidelines to those with heart or lung disease.    Aerobic Exercise & Resistance Training: - Gives group verbal and written instruction on the various  components of exercise. Focuses on aerobic and resistive training programs and the benefits of this training and how to safely progress through these programs.   Flexibility, Balance, Mind/Body Relaxation: Provides group verbal/written instruction on the benefits of flexibility and balance training, including mind/body exercise modes such as yoga, pilates and tai chi.  Demonstration and skill practice provided.   Pulmonary Rehab from 03/21/2019 in [facility] Cardiac and Pulmonary Rehab  Date  03/21/19  Educator  [facility]  Instruction Review Code  1- Verbalizes Understanding      Stress and Anxiety: - Provides group verbal and written instruction about the health risks of elevated stress and causes of high stress.  Discuss the correlation between heart/lung disease and anxiety and treatment options. Review healthy ways to manage with stress and anxiety.   Depression: - Provides group verbal and written instruction on the correlation between heart/lung disease and depressed mood, treatment options, and the stigmas associated with seeking treatment.   Exercise & Equipment Safety: - Individual verbal instruction and demonstration of equipment use and safety with use of the equipment.   Pulmonary Rehab from 03/21/2019 in [facility] Cardiac and Pulmonary Rehab  Date  03/20/19  Educator  [facility]  Instruction Review Code  1- Verbalizes Understanding      Infection Prevention: - Provides verbal and written material to individual with discussion of infection control including proper hand washing and proper equipment cleaning during exercise session.   Pulmonary Rehab from 03/21/2019 in [facility] Cardiac and Pulmonary Rehab  Date  03/19/19  Educator  [facility]  Instruction Review Code  1- Verbalizes Understanding      Falls Prevention: - Provides verbal and written material to individual with discussion of falls prevention and safety.   Pulmonary Rehab from 03/21/2019 in [facility] Cardiac and Pulmonary Rehab  Date  03/19/19   Educator  [facility]  Instruction Review Code  1- Verbalizes Understanding      Diabetes: - Individual verbal and written instruction to review signs/symptoms of diabetes,  desired ranges of glucose level fasting, after meals and with exercise. Advice that pre and post exercise glucose checks will be done for 3 sessions at entry of program.   Pulmonary Rehab from 03/21/2019 in [facility] Cardiac and Pulmonary Rehab  Date  03/19/19  Educator  [facility]  Instruction Review Code  1- Verbalizes Understanding      Chronic Lung Diseases: - Group verbal and written instruction to review updates, respiratory medications, advancements in procedures and treatments. Discuss use of supplemental oxygen including available portable oxygen systems, continuous and intermittent flow rates, concentrators, personal use and safety guidelines. Review proper use of inhaler and spacers. Provide informative websites for self-education.    Energy Conservation: - Provide group verbal and written instruction for methods to conserve energy, plan and organize activities. Instruct on pacing techniques, use of adaptive equipment and posture/positioning to relieve shortness of breath.   Triggers and Exacerbations: - Group verbal and written instruction to review types of environmental triggers and ways to prevent exacerbations. Discuss weather changes, air quality and the benefits of nasal washing. Review warning signs and symptoms to help prevent infections. Discuss techniques for effective airway clearance, coughing, and vibrations.   AED/CPR: - Group verbal and written instruction with the use of models to demonstrate the basic use of the AED with the basic ABC's of resuscitation.   Anatomy and Physiology of the Lungs: - Group verbal and written instruction with the use of models to provide basic lung anatomy and physiology related to function, structure and complications of lung disease.   Anatomy & Physiology of the Heart: -  Group verbal and written instruction and models provide basic cardiac anatomy and physiology, with the coronary electrical and arterial systems. Review of Valvular disease and Heart Failure   Cardiac Medications: - Group verbal and written instruction to review commonly prescribed medications for heart disease. Reviews the medication, class of the drug, and side effects.   Know Your Numbers and Risk Factors: -Group verbal and written instruction about important numbers in your health.  Discussion of what are risk factors and how they play a role in the disease process.  Review of Cholesterol, Blood Pressure, Diabetes, and BMI and the role they play in your overall health.   Sleep Hygiene: -Provides group verbal and written instruction about how sleep can affect your health.  Define sleep hygiene, discuss sleep cycles and impact of sleep habits. Review good sleep hygiene tips.    Other: -Provides group and verbal instruction on various topics (see comments)    Knowledge Questionnaire Score: Knowledge Questionnaire Score - 03/20/19 1226      Knowledge Questionnaire Score   Pre Score  15/16 Education Focus: Oxygen safety        Core Components/Risk Factors/Patient Goals at Admission: Personal Goals and Risk Factors at Admission - 03/20/19 1227      Core Components/Risk Factors/Patient Goals on Admission    Weight Management  Yes;Weight Loss;Obesity    Intervention  Weight Management: Develop a combined nutrition and exercise program designed to reach desired caloric intake, while maintaining appropriate intake of nutrient and fiber, sodium and fats, and appropriate energy expenditure required for the weight goal.;Weight Management: Provide education and appropriate resources to help participant work on and attain dietary goals.;Weight Management/Obesity: Establish reasonable short term and long term weight goals.;Obesity: Provide education and appropriate resources to help participant  work on and attain dietary goals.    Admit Weight  278 lb 9.6 oz (126.4 kg)    Goal Weight:  Short Term  273 lb (123.8 kg)    Goal Weight: Long Term  268 lb (121.6 kg)    Expected Outcomes  Short Term: Continue to assess and modify interventions until short term weight is achieved;Long Term: Adherence to nutrition and physical activity/exercise program aimed toward attainment of established weight goal;Weight Loss: Understanding of general recommendations for a balanced deficit meal plan, which promotes 1-2 lb weight loss per week and includes a negative energy balance of [phone] kcal/d;Understanding recommendations for meals to include 15-35% energy as protein, 25-35% energy from fat, 35-60% energy from carbohydrates, less than 216m of dietary cholesterol, 20-35 gm of total fiber daily;Understanding of distribution of calorie intake throughout the day with the consumption of 4-5 meals/snacks    Improve shortness of breath with ADL's  Yes    Intervention  Provide education, individualized exercise plan and daily activity instruction to help decrease symptoms of SOB with activities of daily living.    Expected Outcomes  Short Term: Improve cardiorespiratory fitness to achieve a reduction of symptoms when performing ADLs;Long Term: Be able to perform more ADLs without symptoms or delay the onset of symptoms    Diabetes  Yes    Intervention  Provide education about signs/symptoms and action to take for hypo/hyperglycemia.;Provide education about proper nutrition, including hydration, and aerobic/resistive exercise prescription along with prescribed medications to achieve blood glucose in normal ranges: Fasting glucose 65-99 mg/dL    Expected Outcomes  Short Term: Participant verbalizes understanding of the signs/symptoms and immediate care of hyper/hypoglycemia, proper foot care and importance of medication, aerobic/resistive exercise and nutrition plan for blood glucose control.;Long Term: Attainment of  HbA1C < 7%.    Heart Failure  Yes    Intervention  Provide a combined exercise and nutrition program that is supplemented with education, support and counseling about heart failure. Directed toward relieving symptoms such as shortness of breath, decreased exercise tolerance, and extremity edema.    Expected Outcomes  Improve functional capacity of life;Short term: Attendance in program 2-3 days a week with increased exercise capacity. Reported lower sodium intake. Reported increased fruit and vegetable intake. Reports medication compliance.;Short term: Daily weights obtained and reported for increase. Utilizing diuretic protocols set by physician.;Long term: Adoption of self-care skills and reduction of barriers for early signs and symptoms recognition and intervention leading to self-care maintenance.    Hypertension  Yes    Intervention  Provide education on lifestyle modifcations including regular physical activity/exercise, weight management, moderate sodium restriction and increased consumption of fresh fruit, vegetables, and low fat dairy, alcohol moderation, and smoking cessation.;Monitor prescription use compliance.    Expected Outcomes  Short Term: Continued assessment and intervention until BP is < 140/9105mHG in hypertensive participants. < 130/806mG in hypertensive participants with diabetes, heart failure or chronic kidney disease.;Long Term: Maintenance of blood pressure at goal levels.    Lipids  Yes    Intervention  Provide education and support for participant on nutrition & aerobic/resistive exercise along with prescribed medications to achieve LDL <41m33mDL >40mg38m Expected Outcomes  Short Term: Participant states understanding of desired cholesterol values and is compliant with medications prescribed. Participant is following exercise prescription and nutrition guidelines.;Long Term: Cholesterol controlled with medications as prescribed, with individualized exercise RX and with  personalized nutrition plan. Value goals: LDL < 41mg,11m > 40 mg.       Core Components/Risk Factors/Patient Goals Review:  Goals and Risk Factor Review    Row Name 04/11/19 0859 [id number] 1103  Core Components/Risk Factors/Patient Goals Review   Personal Goals Review  Weight Management/Obesity;Heart Failure;Hypertension;Diabetes;Improve shortness of breath with ADL's;Lipids  Weight Management/Obesity;Heart Failure;Hypertension;Diabetes;Improve shortness of breath with ADL's;Lipids      Review  Trevonn has been doing well in the program but has gone up on his weight. He wants to lose weight and we talked about how portion sizes are important. Noris has been checking his sugar at home. His blood sugars have been in the 120s in the mornings and 150 at night. Shortness of breath is a problem for Jahmire and was informed to PLB when he is short of breath.  Kvion is doing well. He is now getting hemodialysis and the plan is for it to eventually be home based.  He has started treatment and is keeping a close eye on his sugars and fluid level.  We will continue to monitor as well.      Expected Outcomes  Short: Attend [facility] regularly to improve shortness of breath with ADL's. Long: maintain independence with ADL's  Short: Get dialysis established and numbers leveled out. Long: Continue to monitor risk factors.         Core Components/Risk Factors/Patient Goals at Discharge (Final Review):  Goals and Risk Factor Review - 05/29/19 1103      Core Components/Risk Factors/Patient Goals Review   Personal Goals Review  Weight Management/Obesity;Heart Failure;Hypertension;Diabetes;Improve shortness of breath with ADL's;Lipids    Review  Lord is doing well. He is now getting hemodialysis and the plan is for it to eventually be home based.  He has started treatment and is keeping a close eye on his sugars and fluid level.  We will continue to monitor as well.    Expected Outcomes  Short: Get  dialysis established and numbers leveled out. Long: Continue to monitor risk factors.       ITP Comments: ITP Comments    Row Name 03/19/19 1213 03/20/19 1158 03/26/19 0741 03/28/19 0735 04/03/19 0939   ITP Comments  Virtual Orientation performed. Patient informed when to come in for RD and EP orientation. Diagnosis can be found in [facility] 01/22/2019.  Completed 6MWT and gym orientation.  Initial ITP created and sent for review to Dr. Emily Filbert, Medical Director.  Weight up 2 pounds. Fluid pill stopped last week.  Has appt with MD on Wed this week.  He will call MD if weight continues to increase.  Weight up. Has appt with Kidney MD today and plans to talk about weight gain. Had called office and no response as of this AM  Completed initial RD eval   Row Name 04/04/19 1027 04/20/19 0914 04/20/19 0933 05/02/19 0904 05/28/19 1624   ITP Comments  30 day review competed . ITP sent to Dr Emily Filbert for review, changes as needed and ITP approval signature.  Noticed weight has been rising since admission. Jill has appointment at [facility] today and has been given his exercise Log to share all session weights.  Note From [facility] today FYI: Dr. [name] (nephrology) had stopped his torsemide due to worsening GFR. Labs were checked on 12/16 but results aren't in. In Singh's note, he said patient could take torsemide as needed but he was waiting on the lab results first. I advised the patient to call Singh's officer later today if he hasn't heard anything so he would know if he could resume the torsemide.  30 day review competed . ITP sent to Dr Emily Filbert for review, changes as needed and  ITP approval signature  Brook has not attended since 05/14/19.   [place] Name 05/29/19 1102 05/30/19 1435 06/12/19 1014       ITP Comments  Christino is recovering from his dialysis catheter placement.  He had his first treatment yesterday and is feeling good.  He is going to talk to his doctor about returning to  class.  He also mentioned that they are planning to put in port for home based dialysis, but he was not sure when.  30 day review completed. ITP sent to Dr. Emily Filbert, Medical Director of Cardiac and Pulmonary Rehab. Continue with ITP unless changes are made by physician.  Department operating under reduced schedule until further notice by request from hospital leadership.  Pt has been out since 05/14/19 for medical hold getting ready for dialysis.  Called to check on pt.  He is scheduled to get his graft in on 2/18 and then will need recovery.  We are going to discharge him for now and bring him back once cleared from surgery to finish program.        Comments: Discharge ITP

## 2019-06-12 NOTE — Telephone Encounter (Signed)
Called to check on pt.  He is scheduled to get his graft in on 2/18 and then will need recovery.  We are going to discharge him for now and bring him back once cleared from surgery to finish program.

## 2019-06-12 NOTE — Progress Notes (Signed)
Patient ID: Patrick Hurst, male    DOB: 1955/08/07, 64 y.o.   MRN: [id number]  HPI  Mr Goerlitz is a 64 y/o male with a history of DM, hyperlipidemia, HTN, CKD, thyroid disease, GERD, gout, anemia, depression, obstructive sleep apnea, bell's palsy, remote tobacco use and chronic heart failure.   Echo report from 03/06/2019 reviewed and showed an EF of 55-60% along with minimal MR and trivial TR. Echo report from 06/23/2018 reviewed and showed an EF of 50-55%.  Admitted 03/01/2019 due to acute on chronic HF and new onset atrial fibrillation with RVR. Cardiology consult obtained. Initially given IV lasix with net loss of 13L and then transitioned to oral medications. Was given heparin to start with transition to eliquis. Troponin elevated due to demand ischemia. Discharged after 8 days.   He presents today for a follow-up visit with a chief complaint of minimal shortness of breath upon moderate exertion. He describes this as chronic in nature having been present for several years. He does feel like his breathing has improved since he started dialysis. He has associated fatigue, cough, pedal edema and fluctuating weight gain. He denies any difficulty sleeping, dizziness, abdominal distention, palpitations, chest pain or wheezing.   He is now doing dialysis M, W, F but is have abdominal catheter placed next week so that once it heals, he can start doing home peritoneal dialysis.       Past Medical History:  Diagnosis Date  . Allergy   . Anemia   . Arthritis   . Bell's palsy   . CHF (congestive heart failure) ([place])   . CKD (chronic kidney disease)   . Depression   . Diabetes ([place])   . Gastric ulcer   . GERD (gastroesophageal reflux disease)   . Gout   . History of chicken pox   . Hyperlipidemia   . Hypertension   . Hypothyroidism   . OSA (obstructive sleep apnea)   . Thyroid disease   . Vitamin D deficiency    Past Surgical History:  Procedure Laterality Date  . CATARACT EXTRACTION  Bilateral 2014 and 2015  . CHOLECYSTECTOMY  04/28/2011   Laproscopic; Dr. Pat Patrick  . DIALYSIS/PERMA CATHETER INSERTION N/A 05/24/2019   Procedure: DIALYSIS/PERMA CATHETER INSERTION;  Surgeon: Algernon Huxley, MD;  Location: [facility];  Service: Cardiovascular;  Laterality: N/A;  . GALLBLADDER SURGERY    . LASIK Bilateral 2019   medical  . SHOULDER SURGERY Right 2012   Dr. Leanor Kail   Family History  Problem Relation Age of Onset  . COPD Mother   . Heart disease Father    Social History   Tobacco Use  . Smoking status: Former Smoker    Packs/day: 1.00    Years: 15.00    Pack years: 15.00    Types: Cigarettes    Quit date: 05/03/1978    Years since quitting: 41.1  . Smokeless tobacco: Former Systems developer  . Tobacco comment: started smoking at age 30  Substance Use Topics  . Alcohol use: No    Alcohol/week: 0.0 standard drinks   Allergies  Allergen Reactions  . Guaifenesin Swelling    Throat swelling, increased heart rate.  . Mucinex  [Guaifenesin Er]     Throat swelling, increases heart rate  . Levaquin [Levofloxacin] Palpitations   Prior to Admission medications   Medication Sig Start Date End Date Taking? Authorizing Provider  albuterol (VENTOLIN HFA) 108 (90 Base) MCG/ACT inhaler USE 2 INHALATIONS EVERY 4 HOURS AS NEEDED  FOR WHEEZING OR SHORTNESS OF BREATH 09/28/18  Yes [name], MD  amitriptyline (ELAVIL) 25 MG tablet Take 1 tablet (25 mg total) by mouth at bedtime. 08/18/18  Yes [name], MD  amLODipine (NORVASC) 5 MG tablet Take 1 tablet (5 mg total) by mouth daily. 03/10/19  Yes Danford, Suann Larry, MD  Ascorbic Acid (VITAMIN C) 1000 MG tablet Take 1,000 mg by mouth 2 (two) times daily.    Yes [provider]  atorvastatin (LIPITOR) 40 MG tablet TAKE 1 TABLET DAILY 04/18/19  Yes [name], MD  calcitRIOL (ROCALTROL) 0.5 MCG capsule Take 0.5 mcg by mouth daily. 12/29/18  Yes [provider]  cetirizine (ZYRTEC) 10 MG tablet  Take 10 mg by mouth daily.    Yes [provider]  ELIQUIS 2.5 MG TABS tablet SMARTSIG:1 Tablet(s) By Mouth Every 12 Hours 03/23/19  Yes [provider]  fluticasone furoate-vilanterol (BREO ELLIPTA) 100-25 MCG/INH AEPB Inhale 1 puff into the lungs daily. 12/14/18  Yes Wilhelmina Mcardle, MD  hydrALAZINE (APRESOLINE) 25 MG tablet TAKE 1 TABLET THREE TIMES A DAY 01/01/19  Yes [name], MD  insulin regular human CONCENTRATED (HUMULIN R) 500 UNIT/ML injection Inject up to 25 units twice a day, or as directed by physician 01/05/19  Yes [name], MD  ipratropium-albuterol (DUONEB) 0.5-2.5 (3) MG/3ML SOLN INHALE 3 ML BY NEBULIZATION EVERY 4 HOURS AS NEEDED 11/30/18  Yes Wilhelmina Mcardle, MD  levothyroxine (SYNTHROID) 100 MCG tablet Take 1 tablet (100 mcg total) by mouth daily. 08/23/18  Yes [name], MD  metoprolol tartrate (LOPRESSOR) 50 MG tablet Take 0.5 tablets (25 mg total) by mouth 2 (two) times daily. 03/23/19  Yes [name], MD  Misc. Devices (PULSE OXIMETER FOR FINGER) MISC 1 Device by Does not apply route daily as needed. 07/10/18  Yes [name], MD  pantoprazole (PROTONIX) 40 MG tablet TAKE 1 TABLET TWICE A DAY 02/10/19  Yes [name], MD  testosterone cypionate (DEPO-TESTOSTERONE) 200 MG/ML injection Inject 1 mL (200 mg total) into the muscle every 14 (fourteen) days. 03/23/19  Yes [name], MD  ULORIC 80 MG TABS Take 80 mg by mouth daily.  05/18/17  Yes [provider]  vitamin B-12 (CYANOCOBALAMIN) 1000 MCG tablet Take 1,000 mcg by mouth daily.   Yes [provider]     Review of Systems  Constitutional: Positive for fatigue. Negative for appetite change.  HENT: Positive for rhinorrhea. Negative for congestion and sore throat.   Eyes: Negative.   Respiratory: Positive for cough (productive) and shortness of breath ("better since dialysis"). Negative for wheezing.   Cardiovascular: Positive for leg swelling  (improving). Negative for chest pain and palpitations.  Gastrointestinal: Negative for abdominal distention and abdominal pain.  Endocrine: Negative.   Genitourinary: Negative.   Musculoskeletal: Negative for back pain and neck pain.  Skin: Negative.   Allergic/Immunologic: Negative.   Neurological: Negative for dizziness and light-headedness.  Hematological: Negative for adenopathy. Does not bruise/bleed easily.  Psychiatric/Behavioral: Negative for dysphoric mood and sleep disturbance (wearing bipap at night; oxygen @2L ; sleeping on 1 pillow and wedge). The patient is not nervous/anxious.    Vitals:   06/13/19 0835  BP: (!) 148/61  Pulse: 84  Resp: 20  SpO2: 95%  Weight: 299 lb 3.2 oz (135.7 kg)  Height: 5\' 11"  (1.803 m)   Wt Readings from Last 3 Encounters:  06/13/19 299 lb 3.2 oz (135.7 kg)  06/12/19  298 lb (135.2 kg)  06/01/19 (!) 301 lb 9.6 oz (136.8 kg)   Lab Results  Component Value Date   CREATININE 4.57 (H) 03/09/2019   CREATININE 4.21 (H) 03/08/2019   CREATININE 4.11 (H) 03/07/2019    Physical Exam Vitals and nursing note reviewed.  Constitutional:      Appearance: He is well-developed.  HENT:     Head: Normocephalic and atraumatic.  Neck:     Vascular: No JVD.  Cardiovascular:     Rate and Rhythm: Normal rate and regular rhythm.  Pulmonary:     Effort: Pulmonary effort is normal. No respiratory distress.     Breath sounds: No wheezing or rales.  Abdominal:     Palpations: Abdomen is soft.     Tenderness: There is no abdominal tenderness.  Musculoskeletal:     Cervical back: Normal range of motion and neck supple.     Right lower leg: No tenderness. Edema (trace pitting) present.     Left lower leg: No tenderness. Edema (trace pitting) present.  Skin:    General: Skin is warm and dry.  Neurological:     General: No focal deficit present.     Mental Status: He is alert and oriented to person, place, and time.  Psychiatric:        Mood and Affect:  Mood normal.        Behavior: Behavior normal.    Assessment & Plan:  1: Chronic heart failure with preserved ejection fraction- - NYHA class III - euvolemic today  - weighing daily and he was reminded to call for an overnight weight gain of >2 pounds or a weekly weight gain of >5 pounds - weight up 8 pounds from last visit here 6 weeks ago - not adding salt and has been reading food labels as well as rinsing canned foods  - wearing support socks daily with removal at bedtime - saw cardiology Clayborn Bigness) 03/19/2019 & thinks he returns May 2021 - BNP 03/04/2019 was 315.0 - reports receiving his flu vaccine for this season  2: HTN- - BP looks good today - saw PCP Caryn Section) 03/23/2019 - BMP from 05/21/19 reviewed and showed sodium 143, potassium 4.1, creatinine 3.82 and GFR 18  3: DM-  - A1c 03/02/2019 was 6.1% - glucose at home this morning was 185 - saw nephrology [name]) 05/14/19; currently doing dialysis 3 days a week with the goal to transition to home peritoneal dialysis  4: COPD- - saw pulmonologist Halford Chessman) 04/02/2019 - wearing bipap - wears oxygen at 2L QHS and PRN during the day or upon exertion - participating in pulmonary rehab  Medication list was reviewed.  Return in 6 months or sooner for any questions/ problems before then.

## 2019-06-12 NOTE — Progress Notes (Signed)
Discharge Progress Report  Patient Details  Name: Patrick Hurst MRN: [id number] Date of Birth: 1955/05/20 Referring Provider:     Pulmonary Rehab from 03/20/2019 in [facility]  Referring Provider  Lujean Amel MD       Number of Visits: 13  Reason for Discharge:  Early Exit:  Medical Concerns  Smoking History:  Social History   Tobacco Use  Smoking Status Former Smoker  . Packs/day: 1.00  . Years: 15.00  . Pack years: 15.00  . Types: Cigarettes  . Quit date: 05/03/1978  . Years since quitting: 41.1  Smokeless Tobacco Former [place]   started smoking at age 64    Diagnosis:  OSA (obstructive sleep apnea)  ADL [facility]: Pulmonary Assessment Scores    Row Name 03/20/19 1228         ADL [facility]   ADL Phase  Entry     SOB Score total  16     Rest  0     Walk  1     Stairs  3     Bath  0     Dress  0     Shop  1       CAT Score   CAT Score  12       mMRC Score   mMRC Score  1        Initial Exercise Prescription: Initial Exercise Prescription - 03/20/19 1200      Date of Initial Exercise RX and Referring Provider   Date  03/20/19    Referring Provider  Lujean Amel MD      Treadmill   MPH  2    Grade  0    Minutes  15    METs  2.53      NuStep   Level  2    SPM  80    Minutes  15    METs  2.5      Recumbant Elliptical   Level  1    RPM  50    Minutes  15    METs  2.5      Prescription Details   Frequency (times per week)  3    Duration  Progress to 30 minutes of continuous aerobic without signs/symptoms of physical distress      Intensity   THRR 40-80% of Max Heartrate  104-141    Ratings of Perceived Exertion  11-13    Perceived Dyspnea  0-4      Progression   Progression  Continue to progress workloads to maintain intensity without signs/symptoms of physical distress.      Resistance Training   Training Prescription  Yes    Weight  3 lb    Reps  10-15       Discharge Exercise  Prescription (Final Exercise Prescription Changes): Exercise Prescription Changes - 05/17/19 1600      Response to Exercise   Blood Pressure (Admit)  132/84    Blood Pressure (Exercise)  126/74    Blood Pressure (Exit)  138/74    Heart Rate (Admit)  71 bpm    Heart Rate (Exercise)  90 bpm    Heart Rate (Exit)  82 bpm    Oxygen Saturation (Admit)  92 %    Oxygen Saturation (Exercise)  85 %    Oxygen Saturation (Exit)  88 %    Rating of Perceived Exertion (Exercise)  13    Perceived Dyspnea (Exercise)  3  Symptoms  SOB    Duration  Continue with 30 min of aerobic exercise without signs/symptoms of physical distress.    Intensity  THRR unchanged      Progression   Progression  Continue to progress workloads to maintain intensity without signs/symptoms of physical distress.    Average METs  2.7      Resistance Training   Training Prescription  Yes    Weight  3 lb    Reps  10-15      Interval Training   Interval Training  No      NuStep   Level  3    Minutes  15    METs  3.1      Recumbant Elliptical   Level  3    Minutes  15    METs  2.3      Home Exercise Plan   Plans to continue exercise at  Home (comment)    Frequency  Add 1 additional day to program exercise sessions.    Initial Home Exercises Provided  03/28/19       Functional Capacity: 6 Minute Walk    Row Name 03/20/19 1215         6 Minute Walk   Phase  Initial     Distance  1060 feet     Walk Time  6 minutes     # of Rest Breaks  0     MPH  2.08     METS  2.72     RPE  8     Perceived Dyspnea   1     VO2 Peak  9.51     Symptoms  Yes (comment)     Comments  knee pain 5/10     Resting HR  67 bpm     Resting BP  152/74     Resting Oxygen Saturation   94 %     Exercise Oxygen Saturation  during 6 min walk  89 %     Max Ex. HR  109 bpm     Max Ex. BP  184/62     2 Minute Post BP  162/84       Interval HR   1 Minute HR  109     2 Minute HR  100     3 Minute HR  89     4 Minute HR  90      5 Minute HR  104     6 Minute HR  104     2 Minute Post HR  84     Interval Heart Rate?  Yes       Interval Oxygen   Interval Oxygen?  Yes     Baseline Oxygen Saturation %  94 %     1 Minute Oxygen Saturation %  91 %     1 Minute Liters of Oxygen  0 L Room Air     2 Minute Oxygen Saturation %  89 %     2 Minute Liters of Oxygen  0 L     3 Minute Oxygen Saturation %  89 %     3 Minute Liters of Oxygen  0 L     4 Minute Oxygen Saturation %  89 %     4 Minute Liters of Oxygen  0 L     5 Minute Oxygen Saturation %  91 %     5 Minute Liters of Oxygen  0 L  6 Minute Oxygen Saturation %  97 %     6 Minute Liters of Oxygen  0 L     2 Minute Post Oxygen Saturation %  96 %     2 Minute Post Liters of Oxygen  0 L        Psychological, QOL, Others - Outcomes: PHQ 2/9: Depression screen [facility] 2/9 04/18/2019 03/20/2019 11/18/2017 12/08/2016 02/16/2016  Decreased Interest 3 3 1  0 1  Down, Depressed, Hopeless 0 0 1 0 1  PHQ - 2 Score 3 3 2  0 2  Altered sleeping 0 0 0 0 2  Tired, decreased energy 1 3 3 1 3   Change in appetite 1 0 0 1 2  Feeling bad or failure about yourself  0 0 0 0 1  Trouble concentrating 0 0 0 0 1  Moving slowly or fidgety/restless 0 0 0 0 1  Suicidal thoughts 0 0 0 0 0  PHQ-9 Score 5 6 5 2 12   Difficult doing work/chores Somewhat difficult Somewhat difficult Not difficult at all Somewhat difficult Somewhat difficult    Quality of Life:   Personal Goals: Goals established at orientation with interventions provided to work toward goal. Personal Goals and Risk Factors at Admission - 03/20/19 1227      Core Components/Risk Factors/Patient Goals on Admission    Weight Management  Yes;Weight Loss;Obesity    Intervention  Weight Management: Develop a combined nutrition and exercise program designed to reach desired caloric intake, while maintaining appropriate intake of nutrient and fiber, sodium and fats, and appropriate energy expenditure required for the weight  goal.;Weight Management: Provide education and appropriate resources to help participant work on and attain dietary goals.;Weight Management/Obesity: Establish reasonable short term and long term weight goals.;Obesity: Provide education and appropriate resources to help participant work on and attain dietary goals.    Admit Weight  278 lb 9.6 oz (126.4 kg)    Goal Weight: Short Term  273 lb (123.8 kg)    Goal Weight: Long Term  268 lb (121.6 kg)    Expected Outcomes  Short Term: Continue to assess and modify interventions until short term weight is achieved;Long Term: Adherence to nutrition and physical activity/exercise program aimed toward attainment of established weight goal;Weight Loss: Understanding of general recommendations for a balanced deficit meal plan, which promotes 1-2 lb weight loss per week and includes a negative energy balance of [phone] kcal/d;Understanding recommendations for meals to include 15-35% energy as protein, 25-35% energy from fat, 35-60% energy from carbohydrates, less than 200mg  of dietary cholesterol, 20-35 gm of total fiber daily;Understanding of distribution of calorie intake throughout the day with the consumption of 4-5 meals/snacks    Improve shortness of breath with ADL's  Yes    Intervention  Provide education, individualized exercise plan and daily activity instruction to help decrease symptoms of SOB with activities of daily living.    Expected Outcomes  Short Term: Improve cardiorespiratory fitness to achieve a reduction of symptoms when performing ADLs;Long Term: Be able to perform more ADLs without symptoms or delay the onset of symptoms    Diabetes  Yes    Intervention  Provide education about signs/symptoms and action to take for hypo/hyperglycemia.;Provide education about proper nutrition, including hydration, and aerobic/resistive exercise prescription along with prescribed medications to achieve blood glucose in normal ranges: Fasting glucose 65-99 mg/dL     Expected Outcomes  Short Term: Participant verbalizes understanding of the signs/symptoms and immediate care of hyper/hypoglycemia, proper foot care and importance of  medication, aerobic/resistive exercise and nutrition plan for blood glucose control.;Long Term: Attainment of HbA1C < 7%.    Heart Failure  Yes    Intervention  Provide a combined exercise and nutrition program that is supplemented with education, support and counseling about heart failure. Directed toward relieving symptoms such as shortness of breath, decreased exercise tolerance, and extremity edema.    Expected Outcomes  Improve functional capacity of life;Short term: Attendance in program 2-3 days a week with increased exercise capacity. Reported lower sodium intake. Reported increased fruit and vegetable intake. Reports medication compliance.;Short term: Daily weights obtained and reported for increase. Utilizing diuretic protocols set by physician.;Long term: Adoption of self-care skills and reduction of barriers for early signs and symptoms recognition and intervention leading to self-care maintenance.    Hypertension  Yes    Intervention  Provide education on lifestyle modifcations including regular physical activity/exercise, weight management, moderate sodium restriction and increased consumption of fresh fruit, vegetables, and low fat dairy, alcohol moderation, and smoking cessation.;Monitor prescription use compliance.    Expected Outcomes  Short Term: Continued assessment and intervention until BP is < 140/28mm HG in hypertensive participants. < 130/70mm HG in hypertensive participants with diabetes, heart failure or chronic kidney disease.;Long Term: Maintenance of blood pressure at goal levels.    Lipids  Yes    Intervention  Provide education and support for participant on nutrition & aerobic/resistive exercise along with prescribed medications to achieve LDL 70mg , HDL >40mg .    Expected Outcomes  Short Term: Participant  states understanding of desired cholesterol values and is compliant with medications prescribed. Participant is following exercise prescription and nutrition guidelines.;Long Term: Cholesterol controlled with medications as prescribed, with individualized exercise RX and with personalized nutrition plan. Value goals: LDL < 70mg , HDL > 40 mg.        Personal Goals Discharge: Goals and Risk Factor Review    Row Name 04/11/19 0859 05/29/19 1103           Core Components/Risk Factors/Patient Goals Review   Personal Goals Review  Weight Management/Obesity;Heart Failure;Hypertension;Diabetes;Improve shortness of breath with ADL's;Lipids  Weight Management/Obesity;Heart Failure;Hypertension;Diabetes;Improve shortness of breath with ADL's;Lipids      Review  Lynch has been doing well in the program but has gone up on his weight. He wants to lose weight and we talked about how portion sizes are important. Matrim has been checking his sugar at home. His blood sugars have been in the 120s in the mornings and 150 at night. Shortness of breath is a problem for Trebor and was informed to PLB when he is short of breath.  Tone is doing well. He is now getting hemodialysis and the plan is for it to eventually be home based.  He has started treatment and is keeping a close eye on his sugars and fluid level.  We will continue to monitor as well.      Expected Outcomes  Short: Attend [facility] regularly to improve shortness of breath with ADL's. Long: maintain independence with ADL's  Short: Get dialysis established and numbers leveled out. Long: Continue to monitor risk factors.         Exercise Goals and Review: Exercise Goals    Row Name 03/20/19 1222             Exercise Goals   Increase Physical Activity  Yes       Intervention  Provide advice, education, support and counseling about physical activity/exercise needs.;Develop an individualized exercise prescription for aerobic and resistive training  based  on initial evaluation findings, risk stratification, comorbidities and participant's personal goals.       Expected Outcomes  Short Term: Attend rehab on a regular basis to increase amount of physical activity.;Long Term: Add in home exercise to make exercise part of routine and to increase amount of physical activity.;Long Term: Exercising regularly at least 3-5 days a week.       Increase Strength and Stamina  Yes       Intervention  Provide advice, education, support and counseling about physical activity/exercise needs.;Develop an individualized exercise prescription for aerobic and resistive training based on initial evaluation findings, risk stratification, comorbidities and participant's personal goals.       Expected Outcomes  Short Term: Increase workloads from initial exercise prescription for resistance, speed, and METs.;Short Term: Perform resistance training exercises routinely during rehab and add in resistance training at home;Long Term: Improve cardiorespiratory fitness, muscular endurance and strength as measured by increased METs and functional capacity (6MWT)       Able to understand and use rate of perceived exertion (RPE) scale  Yes       Intervention  Provide education and explanation on how to use RPE scale       Expected Outcomes  Short Term: Able to use RPE daily in rehab to express subjective intensity level;Long Term:  Able to use RPE to guide intensity level when exercising independently       Knowledge and understanding of Target Heart Rate Range (THRR)  Yes       Intervention  Provide education and explanation of THRR including how the numbers were predicted and where they are located for reference       Expected Outcomes  Short Term: Able to state/look up THRR;Short Term: Able to use daily as guideline for intensity in rehab;Long Term: Able to use THRR to govern intensity when exercising independently       Able to check pulse independently  Yes       Intervention  Provide  education and demonstration on how to check pulse in carotid and radial arteries.;Review the importance of being able to check your own pulse for safety during independent exercise       Expected Outcomes  Short Term: Able to explain why pulse checking is important during independent exercise;Long Term: Able to check pulse independently and accurately       Understanding of Exercise Prescription  Yes       Intervention  Provide education, explanation, and written materials on patient's individual exercise prescription       Expected Outcomes  Short Term: Able to explain program exercise prescription;Long Term: Able to explain home exercise prescription to exercise independently          Exercise Goals Re-Evaluation: Exercise Goals Re-Evaluation    Row Name 03/21/19 0746 03/28/19 0753 04/05/19 1207 04/18/19 1117 05/03/19 1306     Exercise Goal Re-Evaluation   Exercise Goals Review  Increase Physical Activity;Able to understand and use rate of perceived exertion (RPE) scale;Knowledge and understanding of Target Heart Rate Range (THRR);Understanding of Exercise Prescription;Able to understand and use Dyspnea scale  Increase Physical Activity;Increase Strength and Stamina;Able to understand and use rate of perceived exertion (RPE) scale;Able to understand and use Dyspnea scale;Knowledge and understanding of Target Heart Rate Range (THRR);Able to check pulse independently;Understanding of Exercise Prescription  Increase Physical Activity;Increase Strength and Stamina;Able to understand and use rate of perceived exertion (RPE) scale;Able to understand and use Dyspnea scale;Knowledge and understanding of Target  Heart Rate Range (THRR);Able to check pulse independently;Understanding of Exercise Prescription  Increase Physical Activity;Increase Strength and Stamina;Understanding of Exercise Prescription  Increase Physical Activity;Increase Strength and Stamina;Able to understand and use rate of perceived  exertion (RPE) scale;Able to understand and use Dyspnea scale;Knowledge and understanding of Target Heart Rate Range (THRR);Able to check pulse independently;Understanding of Exercise Prescription   Comments  Reviewed RPE scale, THR and program prescription with pt today.  Pt voiced understanding and was given a copy of goals to take home.  Reviewed home exercise with pt today.  Pt plans to use TM at home for exercise.  Reviewed THR, pulse, RPE, sign and symptoms, NTG use, and when to call 911 or MD.  Also discussed weather considerations and indoor options.  Pt voiced understanding.  [place] is doing well with exercise.  He has increased levels on most machines and his oxygen stays above 90.  He has increased to 4 lb weights  Greison continues to do well in rehab.  He is already up to level 3 on the NuStep and XR.  We will continue to monitor his progress.  Tylik is trying the TM.  It is hard for him and he is working towards being able to walk for longer duration.   Expected Outcomes  Short: Use RPE daily to regulate intensity. Long: Follow program prescription in THR.  Short - unpack TM at home to get ready to use Long - exercise at home on days not at [place] - attend LW consistently Long - increase stamina overall  Short: Continue to improve workloads.  Long: Continue to improve stamina.  Short - continue to increase time on TM Long : walk 20 min on TM without stopping   Row Name 05/17/19 1638 05/29/19 1103           Exercise Goal Re-Evaluation   Exercise Goals Review  Increase Physical Activity;Increase Strength and Stamina;Understanding of Exercise Prescription  --      Comments  Ilay has been doing well in rehab.  This week his sats were running in the high 80s for exercise.  Last week he had had a rough week and was having blood pressure issues.  We will continue to montior his progress.  Out since last review.      Expected Outcomes  Short: Continue to work on Personal assistant for exericse.  Long:  Continue to improve stamina for walking on treadmill.  --         Nutrition & Weight - Outcomes: Pre Biometrics - 03/20/19 1225      Pre Biometrics   Height  5' 10.6" (1.793 m)    Weight  278 lb 9.6 oz (126.4 kg)    BMI (Calculated)  39.31    Single Leg Stand  1.31 seconds        Nutrition: Nutrition Therapy & Goals - 04/03/19 0914      Nutrition Therapy   Diet  Renal, HH, DM, Low Na diet    Drug/Food Interactions  Purine/Gout    Protein (specify units)  100g    Fiber  30 grams    Whole Grain Foods  3 servings    Saturated Fats  12 max. grams    Fruits and Vegetables  5 servings/day    Sodium  1.5 grams      Personal Nutrition Goals   Nutrition Goal  ST: continue with current changes LT: Lose weight    Comments  Not on dialysis. Renal doctor 2-3  months (blood work 1x/month): pt reports improved kidney function at 13%; pt reports that renal MD stated that if he says at 13% he does not need dialysis.  A1c 6.1%. 4-6 oz of coffee. Lunch 12 oz of water. Afternoon 16 oz. Dinner [id number]. B: 2 packets sugar free oatmeal w/ cup of fruit. L: sometimes: sandwich (chicken salad) with cup of fruit. D: meat w/ two vegetables (chicken baked in oven) variety of vegetables. Uses airfyer. Discussed DM, renal, HH eating. Discussed set point weight range and health outcomes. Pt would not like to make any goals at this time. Pt reports edema with some pitting, not on fluid pill.      Intervention Plan   Intervention  Prescribe, educate and counsel regarding individualized specific dietary modifications aiming towards targeted core components such as weight, hypertension, lipid management, diabetes, heart failure and other comorbidities.;Nutrition handout(s) given to patient.    Expected Outcomes  Short Term Goal: Understand basic principles of dietary content, such as calories, fat, sodium, cholesterol and nutrients.;Short Term Goal: A plan has been developed with personal nutrition goals set during  dietitian appointment.;Long Term Goal: Adherence to prescribed nutrition plan.       Nutrition Discharge: Nutrition Assessments - 03/20/19 1226      MEDFICTS Scores   Pre Score  60       Education Questionnaire Score: Knowledge Questionnaire Score - 03/20/19 1226      Knowledge Questionnaire Score   Pre Score  15/16 Education Focus: Oxygen safety       Goals reviewed with patient; copy given to patient.

## 2019-06-12 NOTE — Patient Instructions (Addendum)
Your procedure is scheduled on: Thursday, February 18 Report to Day Surgery on the 2nd floor of the [facility]. To find out your arrival time, please call [phone] between 1PM - 3PM on: Wednesday, February 17  REMEMBER: Instructions that are not followed completely may result in serious medical risk, up to and including death; or upon the discretion of your surgeon and anesthesiologist your surgery may need to be rescheduled.  Do not eat food after midnight the night before surgery.  No gum chewing, lozengers or hard candies.  You may however, drink water only up to 2 hours before you are scheduled to arrive for your surgery. Do not drink anything within 2 hours of the start of your surgery.  No Alcohol for 24 hours before or after surgery.  No Smoking including e-cigarettes for 24 hours prior to surgery.  No chewable tobacco products for at least 6 hours prior to surgery.  No nicotine patches on the day of surgery.  On the morning of surgery brush your teeth with toothpaste and water, you may rinse your mouth with mouthwash if you wish. Do not swallow any toothpaste or mouthwash.  Notify your doctor if there is any change in your medical condition (cold, fever, infection).  Do not wear jewelry, make-up, hairpins, clips or nail polish.  Do not wear lotions, powders, or perfumes.   Do not shave 48 hours prior to surgery.   Contacts and dentures may not be worn into surgery.  Do not bring valuables to the hospital, including drivers license, insurance or credit cards.  [facility] is not responsible for any belongings or valuables.   TAKE THESE MEDICATIONS THE MORNING OF SURGERY:  1.  Albuterol inhaler 2.  Amiodarone 3.  Amlodipine 4.  Breo ellipta inhaler 5.  Hydralazine 6.  duoneb nebulizer 7.  Levothyroxine 8.  Metoprolol 9.  Pantoprazole - (take one the night before and one on the morning of surgery - helps to prevent nausea after surgery.)  Use CHG Soap as  directed on instruction sheet.  Use inhalers on the day of surgery and bring to the hospital.  Bring your Bi-PAP to the hospital with you in case you may have to spend the night.   Take 1/2 of usual insulin dose the night before surgery and none on the morning of surgery. Only take 12 units the night before surgery and NO insulin on the morning of surgery.  Follow recommendations from Cardiologist, Pulmonologist or PCP regarding stopping Eliquis. Stop 3 days prior. Last day to take is Sunday, Feb. 14; resume after surgery per Dr. Bunnie Domino orders.  Stop Anti-inflammatories (NSAIDS) such as Advil, Aleve, Ibuprofen, Motrin, Naproxen, Naprosyn and Aspirin based products such as Excedrin, Goodys Powder, BC Powder. (May take Tylenol or Acetaminophen if needed.)  Stop ANY OVER THE COUNTER supplements until after surgery. (vitamin C) (May continue Vitamin D, Vitamin B, and multivitamin.)  Wear comfortable clothing (specific to your surgery type) to the hospital.  If you are being discharged the day of surgery, you will not be allowed to drive home. You will need a responsible adult to drive you home and stay with you that night.   If you are taking public transportation, you will need to have a responsible adult with you. Please confirm with your physician that it is acceptable to use public transportation.   Please call [phone] if you have any questions about these instructions.

## 2019-06-13 ENCOUNTER — Ambulatory Visit: Payer: Commercial Managed Care - PPO | Admitting: Family

## 2019-06-13 ENCOUNTER — Encounter: Payer: Self-pay | Admitting: Family

## 2019-06-13 ENCOUNTER — Encounter
Admission: RE | Admit: 2019-06-13 | Discharge: 2019-06-13 | Disposition: A | Discharge status: Home / Self Care | Payer: Commercial Managed Care - PPO | Source: Ambulatory Visit | Attending: Vascular Surgery | Admitting: Vascular Surgery

## 2019-06-13 VITALS — BP 148/61 | HR 84 | Resp 20 | Ht 71.0 in | Wt 299.2 lb

## 2019-06-13 DIAGNOSIS — Z01818 Encounter for other preprocedural examination: Secondary | ICD-10-CM | POA: Diagnosis present

## 2019-06-13 DIAGNOSIS — M109 Gout, unspecified: Secondary | ICD-10-CM | POA: Insufficient documentation

## 2019-06-13 DIAGNOSIS — Z9981 Dependence on supplemental oxygen: Secondary | ICD-10-CM | POA: Diagnosis not present

## 2019-06-13 DIAGNOSIS — I10 Essential (primary) hypertension: Secondary | ICD-10-CM

## 2019-06-13 DIAGNOSIS — Z992 Dependence on renal dialysis: Secondary | ICD-10-CM | POA: Diagnosis not present

## 2019-06-13 DIAGNOSIS — Z794 Long term (current) use of insulin: Secondary | ICD-10-CM | POA: Diagnosis not present

## 2019-06-13 DIAGNOSIS — Z8711 Personal history of peptic ulcer disease: Secondary | ICD-10-CM | POA: Insufficient documentation

## 2019-06-13 DIAGNOSIS — G4733 Obstructive sleep apnea (adult) (pediatric): Secondary | ICD-10-CM | POA: Insufficient documentation

## 2019-06-13 DIAGNOSIS — E039 Hypothyroidism, unspecified: Secondary | ICD-10-CM | POA: Diagnosis not present

## 2019-06-13 DIAGNOSIS — Z8249 Family history of ischemic heart disease and other diseases of the circulatory system: Secondary | ICD-10-CM | POA: Diagnosis not present

## 2019-06-13 DIAGNOSIS — Z79899 Other long term (current) drug therapy: Secondary | ICD-10-CM | POA: Diagnosis not present

## 2019-06-13 DIAGNOSIS — N186 End stage renal disease: Secondary | ICD-10-CM | POA: Diagnosis not present

## 2019-06-13 DIAGNOSIS — K219 Gastro-esophageal reflux disease without esophagitis: Secondary | ICD-10-CM | POA: Insufficient documentation

## 2019-06-13 DIAGNOSIS — F329 Major depressive disorder, single episode, unspecified: Secondary | ICD-10-CM | POA: Diagnosis not present

## 2019-06-13 DIAGNOSIS — E1122 Type 2 diabetes mellitus with diabetic chronic kidney disease: Secondary | ICD-10-CM

## 2019-06-13 DIAGNOSIS — Z87891 Personal history of nicotine dependence: Secondary | ICD-10-CM | POA: Insufficient documentation

## 2019-06-13 DIAGNOSIS — Z7901 Long term (current) use of anticoagulants: Secondary | ICD-10-CM | POA: Insufficient documentation

## 2019-06-13 DIAGNOSIS — E785 Hyperlipidemia, unspecified: Secondary | ICD-10-CM | POA: Diagnosis not present

## 2019-06-13 DIAGNOSIS — Z888 Allergy status to other drugs, medicaments and biological substances status: Secondary | ICD-10-CM | POA: Insufficient documentation

## 2019-06-13 DIAGNOSIS — Z825 Family history of asthma and other chronic lower respiratory diseases: Secondary | ICD-10-CM | POA: Insufficient documentation

## 2019-06-13 DIAGNOSIS — I5032 Chronic diastolic (congestive) heart failure: Secondary | ICD-10-CM | POA: Diagnosis not present

## 2019-06-13 DIAGNOSIS — Z7951 Long term (current) use of inhaled steroids: Secondary | ICD-10-CM | POA: Insufficient documentation

## 2019-06-13 DIAGNOSIS — Z79890 Hormone replacement therapy (postmenopausal): Secondary | ICD-10-CM | POA: Insufficient documentation

## 2019-06-13 DIAGNOSIS — I132 Hypertensive heart and chronic kidney disease with heart failure and with stage 5 chronic kidney disease, or end stage renal disease: Secondary | ICD-10-CM | POA: Insufficient documentation

## 2019-06-13 DIAGNOSIS — Z881 Allergy status to other antibiotic agents status: Secondary | ICD-10-CM | POA: Insufficient documentation

## 2019-06-13 DIAGNOSIS — J449 Chronic obstructive pulmonary disease, unspecified: Secondary | ICD-10-CM | POA: Diagnosis not present

## 2019-06-13 DIAGNOSIS — M1990 Unspecified osteoarthritis, unspecified site: Secondary | ICD-10-CM | POA: Insufficient documentation

## 2019-06-13 DIAGNOSIS — J42 Unspecified chronic bronchitis: Secondary | ICD-10-CM

## 2019-06-13 LAB — CBC WITH DIFFERENTIAL/PLATELET
Abs Immature Granulocytes: 0.02 10*3/uL (ref 0.00–0.07)
Basophils Absolute: 0.1 10*3/uL (ref 0.0–0.1)
Basophils Relative: 1 %
Eosinophils Absolute: 0.2 10*3/uL (ref 0.0–0.5)
Eosinophils Relative: 3 %
HCT: 31.3 % — ABNORMAL LOW (ref 39.0–52.0)
Hemoglobin: 10.2 g/dL — ABNORMAL LOW (ref 13.0–17.0)
Immature Granulocytes: 0 %
Lymphocytes Relative: 15 %
Lymphs Abs: 0.9 10*3/uL (ref 0.7–4.0)
MCH: 29.4 pg (ref 26.0–34.0)
MCHC: 32.6 g/dL (ref 30.0–36.0)
MCV: 90.2 fL (ref 80.0–100.0)
Monocytes Absolute: 0.5 10*3/uL (ref 0.1–1.0)
Monocytes Relative: 9 %
Neutro Abs: 4.5 10*3/uL (ref 1.7–7.7)
Neutrophils Relative %: 72 %
Platelets: 115 10*3/uL — ABNORMAL LOW (ref 150–400)
RBC: 3.47 MIL/uL — ABNORMAL LOW (ref 4.22–5.81)
RDW: 16.4 % — ABNORMAL HIGH (ref 11.5–15.5)
WBC: 6.2 10*3/uL (ref 4.0–10.5)
nRBC: 0 % (ref 0.0–0.2)

## 2019-06-13 LAB — BASIC METABOLIC PANEL
Anion gap: 10 (ref 5–15)
BUN: 42 mg/dL — ABNORMAL HIGH (ref 8–23)
CO2: 27 mmol/L (ref 22–32)
Calcium: 8.4 mg/dL — ABNORMAL LOW (ref 8.9–10.3)
Chloride: 103 mmol/L (ref 98–111)
Creatinine, Ser: 4.34 mg/dL — ABNORMAL HIGH (ref 0.61–1.24)
GFR calc Af Amer: 16 mL/min — ABNORMAL LOW (ref >60)
GFR calc non Af Amer: 14 mL/min — ABNORMAL LOW (ref >60)
Glucose, Bld: 137 mg/dL — ABNORMAL HIGH (ref 70–99)
Potassium: 4.1 mmol/L (ref 3.5–5.1)
Sodium: 140 mmol/L (ref 135–145)

## 2019-06-13 LAB — TYPE AND SCREEN
ABO/RH(D): A POS
Antibody Screen: NEGATIVE

## 2019-06-13 LAB — PROTIME-INR
INR: 1.1 (ref 0.8–1.2)
Prothrombin Time: 13.6 seconds (ref 11.4–15.2)

## 2019-06-13 LAB — APTT: aPTT: 32 seconds (ref 24–36)

## 2019-06-13 NOTE — Patient Instructions (Signed)
Continue weighing daily and call for an overnight weight gain of > 2 pounds or a weekly weight gain of >5 pounds. 

## 2019-06-13 NOTE — Pre-Procedure Instructions (Signed)
Pre-Admit Testing Provider Notification Note  Provider Notified:  (1) Dr. Lucky Cowboy (surgeon) (2) Dr. Clayborn Bigness (Cardiologist)   Notification Mode: Fax  Reason: Abnormal EKG. Request for Clearance.  Response: Fax confirmation received.  Additional Information: Placed on Chart. Noted on Pre-Admit Worksheet.  Signed: Beulah Gandy, RN

## 2019-06-18 ENCOUNTER — Ambulatory Visit: Payer: Commercial Managed Care - PPO

## 2019-06-19 ENCOUNTER — Other Ambulatory Visit: Payer: Self-pay

## 2019-06-19 ENCOUNTER — Other Ambulatory Visit
Admission: RE | Admit: 2019-06-19 | Discharge: 2019-06-19 | Disposition: A | Discharge status: Home / Self Care | Payer: Commercial Managed Care - PPO | Source: Ambulatory Visit | Attending: Vascular Surgery | Admitting: Vascular Surgery

## 2019-06-19 ENCOUNTER — Ambulatory Visit
Admission: RE | Admit: 2019-06-19 | Discharge: 2019-06-19 | Disposition: A | Discharge status: Home / Self Care | Payer: Commercial Managed Care - PPO | Source: Ambulatory Visit | Attending: Pulmonary Disease | Admitting: Pulmonary Disease

## 2019-06-19 DIAGNOSIS — Z20822 Contact with and (suspected) exposure to covid-19: Secondary | ICD-10-CM | POA: Diagnosis not present

## 2019-06-19 DIAGNOSIS — R911 Solitary pulmonary nodule: Secondary | ICD-10-CM | POA: Insufficient documentation

## 2019-06-19 LAB — SARS CORONAVIRUS 2 (TAT 6-24 HRS): SARS Coronavirus 2: NEGATIVE

## 2019-06-21 ENCOUNTER — Encounter: Payer: Self-pay | Admitting: Vascular Surgery

## 2019-06-21 ENCOUNTER — Ambulatory Visit: Payer: Commercial Managed Care - PPO | Admitting: Anesthesiology

## 2019-06-21 ENCOUNTER — Encounter
Admission: RE | Disposition: A | Discharge status: Home / Self Care | Payer: Self-pay | Source: Home / Self Care | Attending: Vascular Surgery

## 2019-06-21 ENCOUNTER — Other Ambulatory Visit: Payer: Self-pay

## 2019-06-21 ENCOUNTER — Ambulatory Visit
Admission: RE | Admit: 2019-06-21 | Discharge: 2019-06-21 | Disposition: A | Discharge status: Home / Self Care | Payer: Commercial Managed Care - PPO | Attending: Vascular Surgery | Admitting: Vascular Surgery

## 2019-06-21 DIAGNOSIS — N186 End stage renal disease: Secondary | ICD-10-CM | POA: Diagnosis not present

## 2019-06-21 DIAGNOSIS — Z7901 Long term (current) use of anticoagulants: Secondary | ICD-10-CM | POA: Insufficient documentation

## 2019-06-21 DIAGNOSIS — Z9981 Dependence on supplemental oxygen: Secondary | ICD-10-CM | POA: Insufficient documentation

## 2019-06-21 DIAGNOSIS — F329 Major depressive disorder, single episode, unspecified: Secondary | ICD-10-CM | POA: Insufficient documentation

## 2019-06-21 DIAGNOSIS — J449 Chronic obstructive pulmonary disease, unspecified: Secondary | ICD-10-CM | POA: Insufficient documentation

## 2019-06-21 DIAGNOSIS — M109 Gout, unspecified: Secondary | ICD-10-CM | POA: Diagnosis not present

## 2019-06-21 DIAGNOSIS — I132 Hypertensive heart and chronic kidney disease with heart failure and with stage 5 chronic kidney disease, or end stage renal disease: Secondary | ICD-10-CM | POA: Diagnosis not present

## 2019-06-21 DIAGNOSIS — K219 Gastro-esophageal reflux disease without esophagitis: Secondary | ICD-10-CM | POA: Diagnosis not present

## 2019-06-21 DIAGNOSIS — Z992 Dependence on renal dialysis: Secondary | ICD-10-CM | POA: Insufficient documentation

## 2019-06-21 DIAGNOSIS — Z79890 Hormone replacement therapy (postmenopausal): Secondary | ICD-10-CM | POA: Insufficient documentation

## 2019-06-21 DIAGNOSIS — Z7951 Long term (current) use of inhaled steroids: Secondary | ICD-10-CM | POA: Diagnosis not present

## 2019-06-21 DIAGNOSIS — I509 Heart failure, unspecified: Secondary | ICD-10-CM | POA: Diagnosis not present

## 2019-06-21 DIAGNOSIS — N185 Chronic kidney disease, stage 5: Secondary | ICD-10-CM

## 2019-06-21 DIAGNOSIS — G4733 Obstructive sleep apnea (adult) (pediatric): Secondary | ICD-10-CM | POA: Diagnosis not present

## 2019-06-21 DIAGNOSIS — E1122 Type 2 diabetes mellitus with diabetic chronic kidney disease: Secondary | ICD-10-CM | POA: Diagnosis not present

## 2019-06-21 DIAGNOSIS — Z794 Long term (current) use of insulin: Secondary | ICD-10-CM | POA: Diagnosis not present

## 2019-06-21 DIAGNOSIS — Z87891 Personal history of nicotine dependence: Secondary | ICD-10-CM | POA: Insufficient documentation

## 2019-06-21 LAB — POCT I-STAT, CHEM 8
BUN: 25 mg/dL — ABNORMAL HIGH (ref 8–23)
Calcium, Ion: 1.05 mmol/L — ABNORMAL LOW (ref 1.15–1.40)
Chloride: 99 mmol/L (ref 98–111)
Creatinine, Ser: 3.6 mg/dL — ABNORMAL HIGH (ref 0.61–1.24)
Glucose, Bld: 162 mg/dL — ABNORMAL HIGH (ref 70–99)
HCT: 29 % — ABNORMAL LOW (ref 39.0–52.0)
Hemoglobin: 9.9 g/dL — ABNORMAL LOW (ref 13.0–17.0)
Potassium: 4.8 mmol/L (ref 3.5–5.1)
Sodium: 139 mmol/L (ref 135–145)
TCO2: 31 mmol/L (ref 22–32)

## 2019-06-21 LAB — GLUCOSE, CAPILLARY: Glucose-Capillary: 149 mg/dL — ABNORMAL HIGH (ref 70–99)

## 2019-06-21 LAB — ABO/RH: ABO/RH(D): A POS

## 2019-06-21 SURGERY — REVISION, CATHETER, CAPD, LAPAROSCOPIC
Anesthesia: General

## 2019-06-21 MED ORDER — ONDANSETRON HCL 4 MG/2ML IJ SOLN
INTRAMUSCULAR | Status: DC | PRN
  Administered 2019-06-21: 4 mg via INTRAVENOUS

## 2019-06-21 MED ORDER — HYDROCODONE-ACETAMINOPHEN 5-325 MG PO TABS: 1.0000 | ORAL_TABLET | Freq: Four times a day (QID) | ORAL | 0 refills | Status: DC | PRN

## 2019-06-21 MED ORDER — SUGAMMADEX SODIUM 500 MG/5ML IV SOLN
INTRAVENOUS | Status: DC | PRN
  Administered 2019-06-21: 500 mg via INTRAVENOUS

## 2019-06-21 MED ORDER — SUGAMMADEX SODIUM 500 MG/5ML IV SOLN
INTRAVENOUS | Status: AC
  Filled 2019-06-21: qty 5

## 2019-06-21 MED ORDER — IPRATROPIUM-ALBUTEROL 0.5-2.5 (3) MG/3ML IN SOLN
RESPIRATORY_TRACT | Status: AC
  Filled 2019-06-21: qty 3

## 2019-06-21 MED ORDER — HYDROCODONE-ACETAMINOPHEN 7.5-325 MG PO TABS
1.0000 | ORAL_TABLET | Freq: Once | ORAL | Status: AC | PRN
  Administered 2019-06-21: 1 via ORAL
  Filled 2019-06-21: qty 1

## 2019-06-21 MED ORDER — FENTANYL CITRATE (PF) 100 MCG/2ML IJ SOLN
INTRAMUSCULAR | Status: AC
  Filled 2019-06-21: qty 2

## 2019-06-21 MED ORDER — HYDROCODONE-ACETAMINOPHEN 7.5-325 MG PO TABS
ORAL_TABLET | ORAL | Status: AC
  Filled 2019-06-21: qty 1

## 2019-06-21 MED ORDER — PROMETHAZINE HCL 25 MG/ML IJ SOLN: 6.2500 mg | INTRAMUSCULAR | Status: DC | PRN

## 2019-06-21 MED ORDER — HYDROMORPHONE HCL 1 MG/ML IJ SOLN: 1.0000 mg | Freq: Once | INTRAMUSCULAR | Status: DC | PRN

## 2019-06-21 MED ORDER — FENTANYL CITRATE (PF) 100 MCG/2ML IJ SOLN
INTRAMUSCULAR | Status: DC | PRN
  Administered 2019-06-21: 50 ug via INTRAVENOUS

## 2019-06-21 MED ORDER — ONDANSETRON HCL 4 MG/2ML IJ SOLN: 4.0000 mg | Freq: Four times a day (QID) | INTRAMUSCULAR | Status: DC | PRN

## 2019-06-21 MED ORDER — MIDAZOLAM HCL 2 MG/2ML IJ SOLN
INTRAMUSCULAR | Status: DC | PRN
  Administered 2019-06-21: 2 mg via INTRAVENOUS

## 2019-06-21 MED ORDER — SUCCINYLCHOLINE CHLORIDE 20 MG/ML IJ SOLN
INTRAMUSCULAR | Status: DC | PRN
  Administered 2019-06-21: 120 mg via INTRAVENOUS

## 2019-06-21 MED ORDER — CEFAZOLIN SODIUM-DEXTROSE 1-4 GM/50ML-% IV SOLN
1.0000 g | INTRAVENOUS | Status: AC
  Administered 2019-06-21: 15:00:00 1 g via INTRAVENOUS

## 2019-06-21 MED ORDER — SODIUM CHLORIDE 0.9 % IV SOLN: INTRAVENOUS | Status: DC

## 2019-06-21 MED ORDER — CEFAZOLIN SODIUM-DEXTROSE 1-4 GM/50ML-% IV SOLN
INTRAVENOUS | Status: AC
  Filled 2019-06-21: qty 50

## 2019-06-21 MED ORDER — CHLORHEXIDINE GLUCONATE CLOTH 2 % EX PADS: 6.0000 | MEDICATED_PAD | Freq: Once | CUTANEOUS | Status: DC

## 2019-06-21 MED ORDER — CHLORHEXIDINE GLUCONATE CLOTH 2 % EX PADS
6.0000 | MEDICATED_PAD | Freq: Once | CUTANEOUS | Status: AC
  Administered 2019-06-21: 09:00:00 6 via TOPICAL

## 2019-06-21 MED ORDER — ONDANSETRON HCL 4 MG/2ML IJ SOLN
INTRAMUSCULAR | Status: AC
  Filled 2019-06-21: qty 2

## 2019-06-21 MED ORDER — ACETAMINOPHEN 160 MG/5ML PO SOLN
325.0000 mg | ORAL | Status: DC | PRN
  Filled 2019-06-21: qty 20.3

## 2019-06-21 MED ORDER — CEFAZOLIN SODIUM-DEXTROSE 2-4 GM/100ML-% IV SOLN
INTRAVENOUS | Status: AC
  Filled 2019-06-21: qty 100

## 2019-06-21 MED ORDER — IPRATROPIUM-ALBUTEROL 0.5-2.5 (3) MG/3ML IN SOLN
3.0000 mL | RESPIRATORY_TRACT | Status: DC
  Administered 2019-06-21: 17:00:00 3 mL via RESPIRATORY_TRACT

## 2019-06-21 MED ORDER — SCOPOLAMINE 1 MG/3DAYS TD PT72
MEDICATED_PATCH | TRANSDERMAL | Status: AC
  Filled 2019-06-21: qty 1

## 2019-06-21 MED ORDER — LIDOCAINE HCL (CARDIAC) PF 100 MG/5ML IV SOSY
PREFILLED_SYRINGE | INTRAVENOUS | Status: DC | PRN
  Administered 2019-06-21: 100 mg via INTRAVENOUS

## 2019-06-21 MED ORDER — FENTANYL CITRATE (PF) 100 MCG/2ML IJ SOLN
25.0000 ug | INTRAMUSCULAR | Status: DC | PRN
  Administered 2019-06-21 (×2): 25 ug via INTRAVENOUS

## 2019-06-21 MED ORDER — MIDAZOLAM HCL 2 MG/2ML IJ SOLN
INTRAMUSCULAR | Status: AC
  Filled 2019-06-21: qty 2

## 2019-06-21 MED ORDER — ACETAMINOPHEN 325 MG PO TABS: 325.0000 mg | ORAL_TABLET | ORAL | Status: DC | PRN

## 2019-06-21 MED ORDER — PROPOFOL 10 MG/ML IV BOLUS
INTRAVENOUS | Status: DC | PRN
  Administered 2019-06-21: 150 mg via INTRAVENOUS

## 2019-06-21 MED ORDER — ROCURONIUM BROMIDE 100 MG/10ML IV SOLN
INTRAVENOUS | Status: DC | PRN
  Administered 2019-06-21: 10 mg via INTRAVENOUS
  Administered 2019-06-21: 30 mg via INTRAVENOUS

## 2019-06-21 SURGICAL SUPPLY — 36 items
ADAPTER BETA CAP QUINTON DIALY (ADAPTER) IMPLANT
ADAPTER CATH DIALYSIS 18.75 (CATHETERS) ×2 IMPLANT
ADH SKN CLS APL DERMABOND .7 (GAUZE/BANDAGES/DRESSINGS) ×1
ADPR DLYS BCP STRL PRTNL ULTEM (ADAPTER)
APL PRP STRL LF DISP 70% ISPRP (MISCELLANEOUS) ×1
CANISTER SUCT 1200ML W/VALVE (MISCELLANEOUS) ×2 IMPLANT
CATH DLYS SWAN NECK 62.5CM (CATHETERS) ×2 IMPLANT
CHLORAPREP W/TINT 26 (MISCELLANEOUS) ×2 IMPLANT
COVER WAND RF STERILE (DRAPES) ×2 IMPLANT
DERMABOND ADVANCED (GAUZE/BANDAGES/DRESSINGS) ×1
DERMABOND ADVANCED .7 DNX12 (GAUZE/BANDAGES/DRESSINGS) ×1 IMPLANT
ELECT CAUTERY BLADE 6.4 (BLADE) ×2 IMPLANT
ELECT REM PT RETURN 9FT ADLT (ELECTROSURGICAL) ×2
ELECTRODE REM PT RTRN 9FT ADLT (ELECTROSURGICAL) ×1 IMPLANT
GLOVE BIO SURGEON STRL SZ7 (GLOVE) ×4 IMPLANT
GLOVE INDICATOR 7.5 STRL GRN (GLOVE) ×2 IMPLANT
GOWN STRL REUS W/ TWL LRG LVL3 (GOWN DISPOSABLE) ×2 IMPLANT
GOWN STRL REUS W/ TWL XL LVL3 (GOWN DISPOSABLE) ×1 IMPLANT
GOWN STRL REUS W/TWL LRG LVL3 (GOWN DISPOSABLE) ×4
GOWN STRL REUS W/TWL XL LVL3 (GOWN DISPOSABLE) ×2
IV NS 500ML (IV SOLUTION) ×2
IV NS 500ML BAXH (IV SOLUTION) ×1 IMPLANT
KIT TURNOVER KIT A (KITS) ×2 IMPLANT
LABEL OR SOLS (LABEL) ×2 IMPLANT
MINICAP W/POVIDONE IODINE SOL (MISCELLANEOUS) ×2 IMPLANT
PACK LAP CHOLECYSTECTOMY (MISCELLANEOUS) ×2 IMPLANT
PENCIL ELECTRO HAND CTR (MISCELLANEOUS) ×2 IMPLANT
SET CYSTO W/LG BORE CLAMP LF (SET/KITS/TRAYS/PACK) ×2 IMPLANT
SET TRANSFER 6 W/TWIST CLAMP 5 (SET/KITS/TRAYS/PACK) ×2 IMPLANT
SET TUBE SMOKE EVAC HIGH FLOW (TUBING) ×2 IMPLANT
SPONGE DRAIN TRACH 4X4 STRL 2S (GAUZE/BANDAGES/DRESSINGS) ×2 IMPLANT
SUT MNCRL AB 4-0 PS2 18 (SUTURE) ×2 IMPLANT
SUT VIC AB 2-0 UR6 27 (SUTURE) ×2 IMPLANT
SUT VICRYL+ 3-0 36IN CT-1 (SUTURE) ×2 IMPLANT
TROCAR XCEL NON-BLD 11X100MML (ENDOMECHANICALS) ×2 IMPLANT
TROCAR XCEL NON-BLD 5MMX100MML (ENDOMECHANICALS) IMPLANT

## 2019-06-21 NOTE — Anesthesia Preprocedure Evaluation (Addendum)
Anesthesia Evaluation  Patient identified by MRN, date of birth, ID band Patient awake    Reviewed: Allergy & Precautions, H&P , NPO status , reviewed documented beta blocker date and time   Airway Mallampati: III     Mouth opening: Limited Mouth Opening  Dental   Pulmonary sleep apnea, Continuous Positive Airway Pressure Ventilation and Oxygen sleep apnea , pneumonia, COPD, former smoker,  Educated re BiPAP and O2 use following GA          Cardiovascular hypertension, +CHF    03/2019 ECHO IMPRESSIONS    1. Left ventricular ejection fraction, by visual estimation, is 55 to  60%. The left ventricle has normal function. There is mildly increased  left ventricular hypertrophy.  2. Global right ventricle has normal systolic function.The right  ventricular size is normal. No increase in right ventricular wall  thickness.  3. Left atrial size was normal.  4. Right atrial size was normal.  5. The mitral valve is normal in structure. Mild mitral valve  regurgitation.  6. The tricuspid valve is normal in structure. Tricuspid valve  regurgitation is trivial.  7. The aortic valve is normal in structure. Aortic valve regurgitation is  not visualized.  8. The pulmonic valve was grossly normal. Pulmonic valve regurgitation is  not visualized.    PVCs on EKG   Neuro/Psych PSYCHIATRIC DISORDERS Depression  Neuromuscular disease    GI/Hepatic PUD, GERD  Medicated and Controlled,  Endo/Other  diabetesHypothyroidism   Renal/GU Renal disease     Musculoskeletal  (+) Arthritis ,   Abdominal   Peds  Hematology  (+) Blood dyscrasia, anemia ,   Anesthesia Other Findings Past Medical History: No date: Allergy No date: Anemia No date: Arthritis No date: Bell's palsy No date: CHF (congestive heart failure) (HCC) No date: CKD (chronic kidney disease) No date: COPD (chronic obstructive pulmonary disease) (HCC) No date:  Dependence on nocturnal oxygen therapy     Comment:  2 LITERS WITH BIPAP No date: Depression No date: Diabetes (HCC) No date: Gastric ulcer No date: GERD (gastroesophageal reflux disease) No date: Gout No date: History of chicken pox No date: Hyperlipidemia No date: Hypertension No date: Hypothyroidism No date: OSA (obstructive sleep apnea) No date: Sleep apnea treated with nocturnal BiPAP No date: Thyroid disease No date: Vitamin D deficiency Past Surgical History: 2014 and 2015: CATARACT EXTRACTION; Bilateral 04/28/2011: CHOLECYSTECTOMY     Comment:  Laproscopic; Dr. Pat Patrick 05/24/2019: DIALYSIS/PERMA CATHETER INSERTION; N/A     Comment:  Procedure: DIALYSIS/PERMA CATHETER INSERTION;  Surgeon:               Algernon Huxley, MD;  Location: [facility];                Service: Cardiovascular;  Laterality: N/A; No date: EYE SURGERY No date: GALLBLADDER SURGERY 2019: LASIK; Bilateral     Comment:  medical 2012: SHOULDER SURGERY; Right     Comment:  Dr. Leanor Kail   Reproductive/Obstetrics                          Anesthesia Physical Anesthesia Plan  ASA: IV  Anesthesia Plan: General ETT   Post-op Pain Management:    Induction: Intravenous  PONV Risk Score and Plan: 3 and Ondansetron, Treatment may vary due to age or medical condition, Midazolam and Dexamethasone  Airway Management Planned: Oral ETT  Additional Equipment:   Intra-op Plan:   Post-operative Plan: Extubation in OR  Informed Consent: I have reviewed the patients History and Physical, chart, labs and discussed the procedure including the risks, benefits and alternatives for the proposed anesthesia with the patient or authorized representative who has indicated his/her understanding and acceptance.     Dental Advisory Given  Plan Discussed with: CRNA  Anesthesia Plan Comments: (Possibility of prolonged post-op ETT discussed, pt aware)      Anesthesia Quick  Evaluation

## 2019-06-21 NOTE — Op Note (Signed)
  OPERATIVE NOTE   PROCEDURE: 1. Laparoscopic peritoneal dialysis catheter placement.  PRE-OPERATIVE DIAGNOSIS: 1. ESRD   POST-OPERATIVE DIAGNOSIS: Same  SURGEON: Leotis Pain, MD  ASSISTANT(S): None  ANESTHESIA: general  ESTIMATED BLOOD LOSS: Minimal   FINDING(S): 1. None  SPECIMEN(S): None  INDICATIONS:  Patient presents with renal failure. The patient has decided to do peritoneal dialysis for his long-term dialysis. Risks and benefits of placement were discussed and he is agreeable to proceed.  Differences between peritoneal dialysis and hemodialysis were discussed.    DESCRIPTION: After obtaining full informed written consent, the patient was brought back to the operating room and placed supine upon the operating table. The patient received IV antibiotics prior to induction. After obtaining adequate anesthesia, the abdomen was prepped and draped in the standard fashion. A small transverse incision was created just to the left of the umbilicus and we dissected down to the fascia and placed a pursestring Vicryl suture. I then entered the peritoneum with an 37mm Optiview trocar placed in the right upper quadrant and insufflated the abdomen with carbon dioxide. I then entered the peritoneum just beside the umbilicus with a trocar and the peritoneal dialysis catheter under direct visualization. The coiled portion of the catheter was parked into the pelvis under direct laparoscopic guidance. The deep cuff was secured to the fascial pursestring suture. A small counterincision was made in the left abdomen and the catheter was brought out this site. The appropriate distal connectors were placed, and I then placed 500 cc of saline through the catheter into the pelvis. The abdomen was desufflated. Immediately, 300 cc of effluent returned through the catheter when the bag was placed to gravity. I took one more look with the camera to ensure that the catheter was in the pelvis and it was. The  41mm trocar was then removed. I then closed the incisions with 3-0 Vicryl and 4-0 Monocryl and placed Dermabond as dressing. Dry dressing was placed around the catheter exit site. The patient was then awakened from anesthesia and taken to the recovery room in stable condition having tolerated the procedure well.  COMPLICATIONS: None  CONDITION: None  Leotis Pain, MD 06/21/2019 3:17 PM   This note was created with Dragon Medical transcription system. Any errors in dictation are purely unintentional.

## 2019-06-21 NOTE — H&P (Signed)
[facility] History & Physical Update  The patient was interviewed and re-examined.  The patient's previous History and Physical has been reviewed and is unchanged.  There is no change in the plan of care. We plan to proceed with the scheduled procedure.  Leotis Pain, MD  06/21/2019, 11:38 AM

## 2019-06-21 NOTE — Anesthesia Procedure Notes (Signed)
Procedure Name: Intubation Date/Time: 06/21/2019 2:36 PM Performed by: Hedda Slade, CRNA Pre-anesthesia Checklist: Patient identified, Patient being monitored, Timeout performed, Emergency Drugs available and Suction available Patient Re-evaluated:Patient Re-evaluated prior to induction Oxygen Delivery Method: Circle system utilized Preoxygenation: Pre-oxygenation with 100% oxygen Induction Type: IV induction Ventilation: Oral airway inserted - appropriate to patient size and Unable to mask ventilate Laryngoscope Size: 3 and McGraph Grade View: Grade I Tube type: Oral Tube size: 7.5 mm Number of attempts: 1 Airway Equipment and Method: Stylet and Video-laryngoscopy Placement Confirmation: ETT inserted through vocal cords under direct vision,  positive ETCO2 and breath sounds checked- equal and bilateral Secured at: 21 cm Tube secured with: Tape Dental Injury: Teeth and Oropharynx as per pre-operative assessment  Future Recommendations: Recommend- induction with short-acting agent, and alternative techniques readily available Comments: Unable to mask d/t beard

## 2019-06-21 NOTE — Transfer of Care (Signed)
Immediate Anesthesia Transfer of Care Note  Patient: Patrick Hurst  Procedure(s) Performed: CONTINUOUS AMBULATORY PERITONEAL DIALYSIS (CAPD) CATHETER REVISION (N/A )  Patient Location: PACU  Anesthesia Type:General  Level of Consciousness: awake, alert  and oriented  Airway & Oxygen Therapy: Patient Spontanous Breathing and Patient connected to face mask oxygen  Post-op Assessment: Report given to RN and Post -op Vital signs reviewed and stable  Post vital signs: Reviewed and stable  Last Vitals:  Vitals Value Taken Time  BP 127/73 06/21/19 1532  Temp 36.7 C 06/21/19 1532  Pulse 71 06/21/19 1534  Resp 16 06/21/19 1534  SpO2 98 % 06/21/19 1534  Vitals shown include unvalidated device data.  Last Pain:  Vitals:   06/21/19 1154  TempSrc: Temporal  PainSc: 0-No pain         Complications: No apparent anesthesia complications

## 2019-06-21 NOTE — Discharge Instructions (Signed)
AMBULATORY SURGERY  DISCHARGE INSTRUCTIONS   1) The drugs that you were given will stay in your system until tomorrow so for the next 24 hours you should not:  A) Drive an automobile B) Make any legal decisions C) Drink any alcoholic beverage   2) You may resume regular meals tomorrow.  Today it is better to start with liquids and gradually work up to solid foods.  You may eat anything you prefer, but it is better to start with liquids, then soup and crackers, and gradually work up to solid foods.   3) Please notify your doctor immediately if you have any unusual bleeding, trouble breathing, redness and pain at the surgery site, drainage, fever, or pain not relieved by medication.    4) Additional Instructions:        Please contact your physician with any problems or Same Day Surgery at [phone], Monday through Friday 6 am to 4 pm, or [place] at [facility] number at [phone].AMBULATORY SURGERY  DISCHARGE INSTRUCTIONS   5) The drugs that you were given will stay in your system until tomorrow so for the next 24 hours you should not:  D) Drive an automobile E) Make any legal decisions F) Drink any alcoholic beverage   6) You may resume regular meals tomorrow.  Today it is better to start with liquids and gradually work up to solid foods.  You may eat anything you prefer, but it is better to start with liquids, then soup and crackers, and gradually work up to solid foods.   7) Please notify your doctor immediately if you have any unusual bleeding, trouble breathing, redness and pain at the surgery site, drainage, fever, or pain not relieved by medication.    8) Additional Instructions:  Please call Dr Ozella Almond office for a 3 week follow up appt with  Eulogio Ditch PA       Please contact your physician with any problems or Same Day Surgery at [phone], Monday through Friday 6 am to 4 pm, or [place] at [facility] number at [phone].

## 2019-06-22 NOTE — Anesthesia Postprocedure Evaluation (Signed)
Anesthesia Post Note  Patient: Patrick Hurst  Procedure(s) Performed: CONTINUOUS AMBULATORY PERITONEAL DIALYSIS (CAPD) CATHETER REVISION (N/A )  Patient location during evaluation: PACU Anesthesia Type: General Level of consciousness: awake and alert and oriented Pain management: pain level controlled Vital Signs Assessment: post-procedure vital signs reviewed and stable Respiratory status: spontaneous breathing Cardiovascular status: blood pressure returned to baseline Anesthetic complications: no     Last Vitals:  Vitals:   06/21/19 1638 06/21/19 1655  BP: 123/69 132/70  Pulse:  69  Resp: (!) 69 13  Temp: (!) 36.1 C   SpO2: 97% 90%    Last Pain:  Vitals:   06/21/19 1638  TempSrc: Temporal  PainSc: 3                  Luria Rosario

## 2019-06-25 ENCOUNTER — Ambulatory Visit: Payer: Commercial Managed Care - PPO | Admitting: Family Medicine

## 2019-07-02 ENCOUNTER — Telehealth: Payer: Self-pay | Admitting: Pulmonary Disease

## 2019-07-02 NOTE — Telephone Encounter (Signed)
CT chest 06/19/19 >> atherosclerosis, 4 cm ascending aorta, 1.2 cm GGO RUL no change since 08/03/18, scarring RUL, 0.7 cm GGO superior segment LLL   Please arrange for ROV in [place] office to review results.

## 2019-07-05 NOTE — Telephone Encounter (Signed)
The patient already has an appointment that was scheduled back on 06/04/19 for Dr. Halford Chessman in [place] office on 07/12/19. Advised the patient Dr. Halford Chessman wanted to discuss the results of his CT and since he was already scheduled it can be done at that time.  Patient voiced understanding. Nothing further needed at this time.

## 2019-07-10 NOTE — Progress Notes (Signed)
Patient: Patrick Hurst Male    DOB: 1955/08/05   64 y.o.   MRN: [id number] Visit Date: 07/11/2019  Today's Provider: Lelon Huh, MD   Chief Complaint  Patient presents with  . Hypertension  . Diabetes  . Hypothyroidism   Subjective:     HPI  Hypertension, follow-up:  BP Readings from Last 3 Encounters:  07/11/19 (!) 168/72  06/21/19 132/70  06/13/19 (!) 148/61    He was last seen for hypertension 1 years ago.  BP at that visit was 128/58. Management since that visit includes no change. He reports good compliance with treatment. He is not having side effects.  He is exercising. He is adherent to low salt diet.   Outside blood pressures are not being checked at home. He is experiencing none.  Patient denies chest pain, chest pressure/discomfort, irregular heart beat and palpitations.   Cardiovascular risk factors include advanced age (older than 58 for men, 77 for women), diabetes mellitus, dyslipidemia, hypertension, male gender and obesity (BMI >= 30 kg/m2).  Use of agents associated with hypertension: thyroid hormones.     Weight trend: increasing steadily Wt Readings from Last 3 Encounters:  07/11/19 (!) 304 lb 3.2 oz (138 kg)  06/13/19 299 lb 3.2 oz (135.7 kg)  06/12/19 298 lb (135.2 kg)    Current diet: in general, a "healthy" diet    ------------------------------------------------------------------------  Diabetes Mellitus Type II, Follow-up:   Lab Results  Component Value Date   HGBA1C 6.1 (H) 03/02/2019   HGBA1C 5.6 02/20/2018   HGBA1C 5.1 11/18/2017    Last seen for diabetes more than 1 year ago.  Management since then includes no changes. He reports good compliance with treatment. He is not having side effects.  Current symptoms include none Home blood sugar records: fasting range: 150-160's  Episodes of hypoglycemia? no   Current insulin regiment: Humulin 30 units twice a day. Most Recent Eye Exam: 10/18/2018 Weight trend:  stable Prior visit with dietician: No Current exercise: walking Current diet habits: in general, a "healthy" diet    Pertinent Labs:    Component Value Date/Time   CHOL 101 02/20/2018 0909   CHOL 180 03/14/2012 0321   TRIG 208 (H) 02/20/2018 0909   TRIG 594 (H) 03/14/2012 0321   HDL 24 (L) 02/20/2018 0909   HDL 21 (L) 03/14/2012 0321   LDLCALC 35 02/20/2018 0909   LDLCALC SEE COMMENT 03/14/2012 0321   CREATININE 3.60 (H) 06/21/2019 1157   CREATININE 0.95 05/04/2013 1240    Wt Readings from Last 3 Encounters:  07/11/19 (!) 304 lb 3.2 oz (138 kg)  06/13/19 299 lb 3.2 oz (135.7 kg)  06/12/19 298 lb (135.2 kg)    ------------------------------------------------------------------------  Follow up for Low Testosterone:  The patient was last seen for this 3 months ago. Changes made at last visit include none.  He reports good compliance with treatment. He feels that condition is Improved. He is not having side effects.   ------------------------------------------------------------------------------------  Follow up for Hypothyroid:  The patient was last seen for this more than 1 year ago. Changes made at last visit include none.  He reports good compliance with treatment. He feels that condition is Improved. He is not having side effects.   Lab Results  Component Value Date   TSH 2.518 03/04/2019   ------------------------------------------------------------------------------------  Allergies  Allergen Reactions  . Guaifenesin Swelling    Throat swelling, increased heart rate.  . Mucinex  [Guaifenesin Er]  Throat swelling, increases heart rate  . Levaquin [Levofloxacin] Palpitations     Current Outpatient Medications:  .  albuterol (VENTOLIN HFA) 108 (90 Base) MCG/ACT inhaler, USE 2 INHALATIONS EVERY 4 HOURS AS NEEDED FOR WHEEZING OR SHORTNESS OF BREATH, Disp: 8.5 g, Rfl: 10 .  amitriptyline (ELAVIL) 25 MG tablet, Take 1 tablet (25 mg total) by mouth at  bedtime., Disp: 90 tablet, Rfl: 3 .  amLODipine (NORVASC) 5 MG tablet, Take 1 tablet (5 mg total) by mouth daily., Disp: 30 tablet, Rfl: 3 .  Ascorbic Acid (VITAMIN C) 1000 MG tablet, Take 1,000 mg by mouth 2 (two) times daily. , Disp: , Rfl:  .  atorvastatin (LIPITOR) 40 MG tablet, TAKE 1 TABLET DAILY, Disp: 90 tablet, Rfl: 0 .  calcitRIOL (ROCALTROL) 0.5 MCG capsule, Take 0.5 mcg by mouth daily., Disp: , Rfl:  .  cetirizine (ZYRTEC) 10 MG tablet, Take 10 mg by mouth daily. , Disp: , Rfl:  .  ELIQUIS 2.5 MG TABS tablet, SMARTSIG:1 Tablet(s) By Mouth Every 12 Hours, Disp: , Rfl:  .  fluticasone furoate-vilanterol (BREO ELLIPTA) 100-25 MCG/INH AEPB, Inhale 1 puff into the lungs daily., Disp: 180 each, Rfl: 3 .  hydrALAZINE (APRESOLINE) 25 MG tablet, TAKE 1 TABLET THREE TIMES A DAY, Disp: 270 tablet, Rfl: 4 .  insulin regular human CONCENTRATED (HUMULIN R) 500 UNIT/ML injection, Inject up to 25 units twice a day, or as directed by physician, Disp: 60 mL, Rfl: 3 .  ipratropium-albuterol (DUONEB) 0.5-2.5 (3) MG/3ML SOLN, INHALE 3 ML BY NEBULIZATION EVERY 4 HOURS AS NEEDED, Disp: 360 mL, Rfl: 8 .  levothyroxine (SYNTHROID) 100 MCG tablet, Take 1 tablet (100 mcg total) by mouth daily., Disp: 90 tablet, Rfl: 3 .  metoprolol tartrate (LOPRESSOR) 50 MG tablet, Take 0.5 tablets (25 mg total) by mouth 2 (two) times daily., Disp: , Rfl:  .  Misc. Devices (PULSE OXIMETER FOR FINGER) MISC, 1 Device by Does not apply route daily as needed., Disp: 1 each, Rfl: 0 .  pantoprazole (PROTONIX) 40 MG tablet, TAKE 1 TABLET TWICE A DAY, Disp: 180 tablet, Rfl: 3 .  testosterone cypionate (DEPO-TESTOSTERONE) 200 MG/ML injection, Inject 1 mL (200 mg total) into the muscle every 14 (fourteen) days., Disp: 6 mL, Rfl: 1 .  ULORIC 80 MG TABS, Take 80 mg by mouth daily. , Disp: , Rfl:  .  vitamin B-12 (CYANOCOBALAMIN) 1000 MCG tablet, Take 1,000 mcg by mouth daily., Disp: , Rfl:   Review of Systems  Constitutional: Negative for  appetite change, chills and fever.  Respiratory: Negative for chest tightness, shortness of breath and wheezing.   Cardiovascular: Negative for chest pain and palpitations.  Gastrointestinal: Negative for abdominal pain, nausea and vomiting.    Social History   Tobacco Use  . Smoking status: Former Smoker    Packs/day: 1.00    Years: 15.00    Pack years: 15.00    Types: Cigarettes    Quit date: 05/03/1978    Years since quitting: 41.2  . Smokeless tobacco: Never Used  . Tobacco comment: started smoking at age 63  Substance Use Topics  . Alcohol use: No    Alcohol/week: 0.0 standard drinks      Objective:   BP (!) 168/72 (BP Location: Right Arm, Patient Position: Sitting, Cuff Size: Large)   Temp (!) 97.1 F (36.2 C) (Temporal)   Wt (!) 304 lb 3.2 oz (138 kg)   BMI 42.43 kg/m  Vitals:   07/11/19 0819  BP: Marland Kitchen)  168/72  Temp: (!) 97.1 F (36.2 C)  TempSrc: Temporal  Weight: (!) 304 lb 3.2 oz (138 kg)  Body mass index is 42.43 kg/m.   Physical Exam  General appearance: Overweight male, cooperative and in no acute distress Head: Normocephalic, without obvious abnormality, atraumatic Respiratory: Respirations even and unlabored, normal respiratory rate Extremities: All extremities are intact.  Skin: Skin color, texture, turgor normal. No rashes seen  Psych: Appropriate mood and affect. Neurologic: Mental status: Alert, oriented to person, place, and time, thought content appropriate.  Results for orders placed or performed in visit on 07/11/19  POCT HgB A1C  Result Value Ref Range   Hemoglobin A1C 5.6 4.0 - 5.6 %   Est. average glucose Bld gHb Est-mCnc 114       Assessment & Plan    1. Type 2 diabetes mellitus with diabetic neuropathy, with long-term current use of insulin (HCC) Very well controlled. He states he has had absolutely no trouble with hypoglycemia and sugars always over 100. Will continue same meds for now, but advised to let me know or reduce insulin  dose if he does have any episodes or symptoms of hypoglycemia.   2. Essential hypertension Stable, Continue current medications.    3. ESRD on dialysis ([place])   4. 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 Hypothyroidism, unspecified type Last tsh in euthyroid range.   Follow up 4 months.    The entirety of the information documented in the History of Present Illness, Review of Systems and Physical Exam were personally obtained by me. Portions of this information were initially documented by Meyer Cory, CMA and reviewed by me for thoroughness and accuracy.    Lelon Huh, MD  [facility]

## 2019-07-11 ENCOUNTER — Other Ambulatory Visit: Payer: Self-pay

## 2019-07-11 ENCOUNTER — Ambulatory Visit (INDEPENDENT_AMBULATORY_CARE_PROVIDER_SITE_OTHER): Payer: Commercial Managed Care - PPO | Admitting: Family Medicine

## 2019-07-11 ENCOUNTER — Telehealth: Payer: Self-pay | Admitting: Pulmonary Disease

## 2019-07-11 ENCOUNTER — Encounter: Payer: Self-pay | Admitting: Family Medicine

## 2019-07-11 VITALS — BP 168/72 | Temp 97.1°F | Wt 304.2 lb

## 2019-07-11 DIAGNOSIS — D696 Thrombocytopenia, unspecified: Secondary | ICD-10-CM

## 2019-07-11 DIAGNOSIS — Z9981 Dependence on supplemental oxygen: Secondary | ICD-10-CM | POA: Insufficient documentation

## 2019-07-11 DIAGNOSIS — Z992 Dependence on renal dialysis: Secondary | ICD-10-CM

## 2019-07-11 DIAGNOSIS — I10 Essential (primary) hypertension: Secondary | ICD-10-CM

## 2019-07-11 DIAGNOSIS — E559 Vitamin D deficiency, unspecified: Secondary | ICD-10-CM | POA: Insufficient documentation

## 2019-07-11 DIAGNOSIS — N186 End stage renal disease: Secondary | ICD-10-CM | POA: Diagnosis not present

## 2019-07-11 DIAGNOSIS — Z794 Long term (current) use of insulin: Secondary | ICD-10-CM

## 2019-07-11 DIAGNOSIS — E039 Hypothyroidism, unspecified: Secondary | ICD-10-CM

## 2019-07-11 DIAGNOSIS — E1140 Type 2 diabetes mellitus with diabetic neuropathy, unspecified: Secondary | ICD-10-CM

## 2019-07-11 LAB — POCT GLYCOSYLATED HEMOGLOBIN (HGB A1C)
Est. average glucose Bld gHb Est-mCnc: 114
Hemoglobin A1C: 5.6 % (ref 4.0–5.6)

## 2019-07-11 MED ORDER — "INSULIN SYRINGE-NEEDLE U-100 31G X 5/16"" 1 ML MISC": 4 refills | Status: DC

## 2019-07-11 NOTE — Telephone Encounter (Signed)
I am forwarding this to [employer] as an FYI  I called and spoke with Tiffany at [facility] and confirmed that the patient is not with them and [employer] needs to be called for a download.

## 2019-07-12 ENCOUNTER — Other Ambulatory Visit: Payer: Self-pay

## 2019-07-12 ENCOUNTER — Encounter: Payer: Self-pay | Admitting: Pulmonary Disease

## 2019-07-12 ENCOUNTER — Ambulatory Visit (INDEPENDENT_AMBULATORY_CARE_PROVIDER_SITE_OTHER): Payer: Commercial Managed Care - PPO | Admitting: Pulmonary Disease

## 2019-07-12 VITALS — BP 146/74 | HR 57 | Temp 98.4°F | Ht 71.0 in | Wt 296.0 lb

## 2019-07-12 DIAGNOSIS — R911 Solitary pulmonary nodule: Secondary | ICD-10-CM | POA: Diagnosis not present

## 2019-07-12 DIAGNOSIS — G4733 Obstructive sleep apnea (adult) (pediatric): Secondary | ICD-10-CM | POA: Diagnosis not present

## 2019-07-12 DIAGNOSIS — J449 Chronic obstructive pulmonary disease, unspecified: Secondary | ICD-10-CM | POA: Diagnosis not present

## 2019-07-12 NOTE — Telephone Encounter (Signed)
Patrick Hurst, please advise if you still need a DL. Pt was seen today 07/12/19 and was mentioned in note that he is compliant with Bipap. Thanks.

## 2019-07-12 NOTE — Progress Notes (Signed)
[facility]  Chief Complaint  Patient presents with  . Follow-up    Breathing is overall doing well today. CT chest was done 06/19/19. He is doing doing well with BIPAP and o2 at night and feels well rested during the day.     Constitutional:  BP (!) 146/74 (BP Location: Left Arm, Cuff Size: Normal)   Pulse (!) 57   Temp 98.4 F (36.9 C) (Oral)   Ht 5\' 11"  (1.803 m)   Wt 296 lb (134.3 kg)   SpO2 94% Comment: on RA  BMI 41.28 kg/m   Past Medical History:  Gastritis, DM, HLD, HTN, Hypothyroidism, A fib, ESRD, PNA, Secondary hyperparathyroidism, ANA positive, Psoriasis, Osteoarthritis, Thrombocytopenia, Liver cirrhosis, Vit D deficiency, Gout, GERD, Depression, Bell's palsy, Anemia  Brief Summary:  Patrick Hurst is a 64 y.o. male former smoker with COPD, obstructive sleep apnea on Bipap, and chronic hypoxic respiratory failure on 2 liters oxygen at night.  He had CT chest in February (reviewed by me).  Stable nodules.  Ascending aorta enlarged.  Had Bipap titration study.  Did well with Bipap.  Didn't need oxygen during the study.  Started on HD in January.  Will transition to PD later this month.  Breathing better.  Not having cough, wheeze, sputum, chest tightness.  Using breo and albuterol nebulizer in the morning.  This seems to be working well.  Physical Exam:   Appearance - well kempt   ENMT - no sinus tenderness, no nasal discharge, no oral exudate  Neck - no masses, trachea midline, no thyromegaly, no elevation in JVP  Respiratory - normal appearance of chest wall, normal respiratory effort w/o accessory muscle use, no dullness on percussion, no wheezing or rales  CV - s1s2 regular rate and rhythm, no murmurs, no peripheral edema, radial pulses symmetric  Lymph - no adenopathy noted in neck and axillary areas  Ext - no cyanosis, clubbing, or joint inflammation noted  Psych - normal mood and affect   Assessment/Plan:    COPD with chronic bronchitis. - continue breo with prn albuterol  Obstructive sleep apnea. - he is compliant with auto Bipap and reports benefit  Chronic respiratory failure with hypoxia. - likely from combination of COPD and OSA/OHS - he didn't need supplemental oxygen while on Bipap during recent titration study - will arrange for ONO with auto Bipap and then determine if he can have his home oxygen set up discontinued  Lung nodules. - stable - will need f/u CT chest w/o contrast in February 2023  Hx of PAF, diastolic CHF, Ascending aortic dilation. - follow up with Dr. Clayborn Bigness with cardiology in [place]   ESRD. - plan to transition to PD later this month - followed by Dr. Murlean Iba   Patient Instructions  Will arrange for overnight oxygen test with you on auto Bipap; don't use your supplemental oxygen when you do the overnight oxygen test.  Follow up in 6 months   Time spent 32 minutes.  Chesley Mires, MD [facility] Pager: [phone] 07/12/2019, 10:32 AM  Flow Sheet     Pulmonary tests:  A1AT 07/14/15 >> 151 PFT 08/03/18 >> FEV1 1.91 (52%), FEV1% 61, TLC 6.88 (95%), DLCO 73%  Chest imaging:  V/Q scan 06/26/18 >> normal CT chest 08/03/18 >> ATX, 12 mm GGO nodule RUL, 7 mm GGO nodule superior segment LLL CT chest 06/19/19 >> atherosclerosis, 4 cm ascending aorta, 1.2 cm GGO RUL no change since 08/03/18, scarring RUL,  0.7 cm GGO superior segment LLL  Sleep tests:  Bipap titration 06/12/19 >> Bipap 25/21 cm H2O Auto Bipap 06/12/19 to 07/11/19 >> used on 30 of 30 nights with average 9 hrs 11 min.  Average AHI 4.8.  Cardiac tests:  Echo 03/06/19 >> EF 55 to 60%, mild LVH, mild MR  Medications:   Allergies as of 07/12/2019      Reactions   Guaifenesin Swelling   Throat swelling, increased heart rate.   Mucinex  [guaifenesin Er]    Throat swelling, increases heart rate   Levaquin [levofloxacin] Palpitations      Medication List        Accurate as of July 12, 2019 10:32 AM. If you have any questions, ask your nurse or doctor.        albuterol 108 (90 Base) MCG/ACT inhaler Commonly known as: VENTOLIN HFA USE 2 INHALATIONS EVERY 4 HOURS AS NEEDED FOR WHEEZING OR SHORTNESS OF BREATH   amitriptyline 25 MG tablet Commonly known as: ELAVIL Take 1 tablet (25 mg total) by mouth at bedtime.   amLODipine 5 MG tablet Commonly known as: NORVASC Take 1 tablet (5 mg total) by mouth daily.   atorvastatin 40 MG tablet Commonly known as: LIPITOR TAKE 1 TABLET DAILY   Breo Ellipta 100-25 MCG/INH Aepb Generic drug: fluticasone furoate-vilanterol Inhale 1 puff into the lungs daily.   calcitRIOL 0.5 MCG capsule Commonly known as: ROCALTROL Take 0.5 mcg by mouth daily.   cetirizine 10 MG tablet Commonly known as: ZYRTEC Take 10 mg by mouth daily.   Eliquis 2.5 MG Tabs tablet Generic drug: apixaban SMARTSIG:1 Tablet(s) By Mouth Every 12 Hours   HUMULIN R 500 UNIT/ML injection Generic drug: insulin regular human CONCENTRATED Inject up to 25 units twice a day, or as directed by physician   hydrALAZINE 25 MG tablet Commonly known as: APRESOLINE TAKE 1 TABLET THREE TIMES A DAY   Insulin Syringe-Needle U-100 31G X 5/16" 1 ML Misc Commonly known as: BD Insulin Syringe U/F Use for insulin injection up to four times daily for insulin dependent diabetes   ipratropium-albuterol 0.5-2.5 (3) MG/3ML Soln Commonly known as: DUONEB INHALE 3 ML BY NEBULIZATION EVERY 4 HOURS AS NEEDED   levothyroxine 100 MCG tablet Commonly known as: SYNTHROID Take 1 tablet (100 mcg total) by mouth daily.   metoprolol tartrate 50 MG tablet Commonly known as: LOPRESSOR Take 0.5 tablets (25 mg total) by mouth 2 (two) times daily.   pantoprazole 40 MG tablet Commonly known as: PROTONIX TAKE 1 TABLET TWICE A DAY   Pulse Oximeter For Finger Misc 1 Device by Does not apply route daily as needed.   testosterone cypionate 200 MG/ML injection  Commonly known as: Depo-Testosterone Inject 1 mL (200 mg total) into the muscle every 14 (fourteen) days.   Uloric 80 MG Tabs Generic drug: Febuxostat Take 80 mg by mouth daily.   vitamin B-12 1000 MCG tablet Commonly known as: CYANOCOBALAMIN Take 1,000 mcg by mouth daily.   vitamin C 1000 MG tablet Take 1,000 mg by mouth 2 (two) times daily.       Past Surgical History:  He  has a past surgical history that includes Gallbladder surgery; Shoulder surgery (Right, 2012); Cholecystectomy (04/28/2011); Cataract extraction (Bilateral, 2014 and 2015); LASIK (Bilateral, 2019); DIALYSIS/PERMA CATHETER INSERTION (N/A, 05/24/2019); and Eye surgery.  Family History:  His family history includes COPD in his mother; Cancer in his mother; Heart disease in his father.  Social History:  He  reports that he  quit smoking about 41 years ago. His smoking use included cigarettes. He has a 15.00 pack-year smoking history. He has never used smokeless tobacco. He reports that he does not drink alcohol or use drugs.

## 2019-07-12 NOTE — Telephone Encounter (Signed)
Dr. Halford Chessman, this patient was seen by you in [place] today.  Just trying to confirm the patient brought SD card for download, not able to obtain information on resmed and not listed on care orchestrator.

## 2019-07-12 NOTE — Patient Instructions (Signed)
Will arrange for overnight oxygen test with you on auto Bipap; don't use your supplemental oxygen when you do the overnight oxygen test.  Follow up in 6 months

## 2019-07-13 ENCOUNTER — Encounter (INDEPENDENT_AMBULATORY_CARE_PROVIDER_SITE_OTHER): Payer: Self-pay | Admitting: Nurse Practitioner

## 2019-07-13 ENCOUNTER — Ambulatory Visit (INDEPENDENT_AMBULATORY_CARE_PROVIDER_SITE_OTHER): Payer: Commercial Managed Care - PPO | Admitting: Nurse Practitioner

## 2019-07-13 VITALS — BP 150/69 | HR 78 | Resp 20 | Ht 71.0 in | Wt 269.0 lb

## 2019-07-13 DIAGNOSIS — Z992 Dependence on renal dialysis: Secondary | ICD-10-CM

## 2019-07-13 DIAGNOSIS — N186 End stage renal disease: Secondary | ICD-10-CM

## 2019-07-13 NOTE — Telephone Encounter (Signed)
Reviewed download during office visit in [place].

## 2019-07-13 NOTE — Progress Notes (Signed)
SUBJECTIVE:  Patient ID: Patrick Hurst, male    DOB: Jun 30, 1955, 64 y.o.   MRN: [id number] Chief Complaint  Patient presents with  . Follow-up    3 wk ARMc Post op Wound re check     HPI  Patrick Hurst is a 64 y.o. male presents today after peritoneal dialysis catheter placement on 05/24/2019 patient states that he has been doing well post surgery.  He denies having any pain, fever, chills or diarrhea.  The patient is set to start peritoneal dialysis catheter training next week.  He denies any current issues with his PermCath.  He is anxious to begin peritoneal dialysis.  Currently his wounds show no signs symptoms of infection or dehiscence.  Overall the patient is doing well.  Past Medical History:  Diagnosis Date  . Acute on chronic diastolic CHF (congestive heart failure) ([facility]) 06/21/2018  . Bell palsy 02/12/2015  . Carpal tunnel syndrome 02/12/2015  . Dependence on nocturnal oxygen therapy    2 LITERS WITH BIPAP  . Depression   . Gastric ulcer   . GERD (gastroesophageal reflux disease)   . Gout   . History of chicken pox   . Multifocal pneumonia 03/04/2019  . Sleep apnea treated with nocturnal BiPAP     Past Surgical History:  Procedure Laterality Date  . CATARACT EXTRACTION Bilateral 2014 and 2015  . CHOLECYSTECTOMY  04/28/2011   Laproscopic; Dr. Pat Patrick  . DIALYSIS/PERMA CATHETER INSERTION N/A 05/24/2019   Procedure: DIALYSIS/PERMA CATHETER INSERTION;  Surgeon: Algernon Huxley, MD;  Location: [facility];  Service: Cardiovascular;  Laterality: N/A;  . EYE SURGERY    . GALLBLADDER SURGERY    . LASIK Bilateral 2019   medical  . SHOULDER SURGERY Right 2012   Dr. Leanor Kail    Social History   Socioeconomic History  . Marital status: Married    Spouse name: Hilda Blades  . Number of children: Not on file  . Years of education: Not on file  . Highest education level: Not on file  Occupational History  . Occupation: Works at [employer]    Comment: Full time    Tobacco Use  . Smoking status: Former Smoker    Packs/day: 1.00    Years: 15.00    Pack years: 15.00    Types: Cigarettes    Quit date: 05/03/1978    Years since quitting: 41.2  . Smokeless tobacco: Never Used  . Tobacco comment: started smoking at age 73  Substance and Sexual Activity  . Alcohol use: No    Alcohol/week: 0.0 standard drinks  . Drug use: No  . Sexual activity: Not Currently  Other Topics Concern  . Not on file  Social History Narrative  . Not on file   Social Determinants of Health   Financial Resource Strain: Low Risk   . Difficulty of Paying Living Expenses: Not hard at all  Food Insecurity: No Food Insecurity  . Worried About Charity fundraiser in the Last Year: Never true  . Ran Out of Food in the Last Year: Never true  Transportation Needs: No Transportation Needs  . Lack of Transportation (Medical): No  . Lack of Transportation (Non-Medical): No  Physical Activity: Unknown  . Days of Exercise per Week: 5 days  . Minutes of Exercise per Session: Not on file  Stress: No Stress Concern Present  . Feeling of Stress : Not at all  Social Connections: Slightly Isolated  . Frequency of Communication with  Friends and Family: Three times a week  . Frequency of Social Gatherings with Friends and Family: Three times a week  . Attends Religious Services: 1 to 4 times per year  . Active Member of Clubs or Organizations: No  . Attends Archivist Meetings: Never  . Marital Status: Married  Human resources officer Violence: Not At Risk  . Fear of Current or Ex-Partner: No  . Emotionally Abused: No  . Physically Abused: No  . Sexually Abused: No    Family History  Problem Relation Age of Onset  . COPD Mother   . Cancer Mother   . Heart disease Father     Allergies  Allergen Reactions  . Guaifenesin Swelling    Throat swelling, increased heart rate.  . Mucinex  [Guaifenesin Er]     Throat swelling, increases heart rate  . Levaquin [Levofloxacin]  Palpitations     Review of Systems   Review of Systems: Negative Unless Checked Constitutional: [] Weight loss  [] Fever  [] Chills Cardiac: [] Chest pain   []  Atrial Fibrillation  [] Palpitations   [] Shortness of breath when laying flat   [] Shortness of breath with exertion. [] Shortness of breath at rest Vascular:  [] Pain in legs with walking   [] Pain in legs with standing [] Pain in legs when laying flat   [] Claudication    [] Pain in feet when laying flat    [] History of DVT   [] Phlebitis   [] Swelling in legs   [] Varicose veins   [] Non-healing ulcers Pulmonary:   [] Uses home oxygen   [] Productive cough   [] Hemoptysis   [] Wheeze  [] COPD   [] Asthma Neurologic:  [] Dizziness   [] Seizures  [] Blackouts [] History of stroke   [] History of TIA  [] Aphasia   [] Temporary Blindness   [] Weakness or numbness in arm   [] Weakness or numbness in leg Musculoskeletal:   [] Joint swelling   [] Joint pain   [] Low back pain  []  History of Knee Replacement [] Arthritis [] back Surgeries  []  Spinal Stenosis    Hematologic:  [] Easy bruising  [] Easy bleeding   [] Hypercoagulable state   [x] Anemic Gastrointestinal:  [] Diarrhea   [] Vomiting  [x] Gastroesophageal reflux/heartburn   [] Difficulty swallowing. [] Abdominal pain Genitourinary:  [x] Chronic kidney disease   [] Difficult urination  [] Anuric   [] Blood in urine [] Frequent urination  [] Burning with urination   [] Hematuria Skin:  [] Rashes   [] Ulcers [x] Wounds Psychological:  [] History of anxiety   [x]  History of major depression  []  Memory Difficulties      OBJECTIVE:   Physical Exam  BP (!) 150/69   Pulse 78   Resp 20   Ht 5\' 11"  (1.803 m)   Wt 269 lb (122 kg)   BMI 37.52 kg/m   Gen: WD/WN, NAD Head: [place]/AT, No temporalis wasting.  Ear/Nose/Throat: Hearing grossly intact, nares w/o erythema or drainage Eyes: PER, EOMI, sclera nonicteric.  Neck: Supple, no masses.  No JVD.  Pulmonary:  Good air movement, no use of accessory muscles.  Cardiac: RRR Vascular: good    Vessel Right Left   Gastrointestinal: soft, non-distended. No guarding/no peritoneal signs.  Musculoskeletal: M/S 5/5 throughout.  No deformity or atrophy.  Neurologic: Pain and light touch intact in extremities.  Symmetrical.  Speech is fluent. Motor exam as listed above. Psychiatric: Judgment intact, Mood & affect appropriate for pt's clinical situation. Dermatologic: No Venous rashes. No Ulcers Noted.  No changes consistent with cellulitis. Lymph : No Cervical lymphadenopathy, no lichenification or skin changes of chronic lymphedema.       ASSESSMENT  AND PLAN:  1. ESRD on dialysis [facility]) Currently the patient has about 2 weeks of training before he can begin peritoneal dialysis at home.  Wound sites are well approximated with no signs symptoms of infection.  Advised the patient that once he is on peritoneal dialysis and ensure it is working correctly, his dialysis center can contact us upon removal of his PermCath.  Otherwise, patient will follow up on an as-needed basis unless there are issues with his dialysis catheter or if he has a need to move to hemodialysis in the future.   Current Outpatient Medications on File Prior to Visit  Medication Sig Dispense Refill  . albuterol (VENTOLIN HFA) 108 (90 Base) MCG/ACT inhaler USE 2 INHALATIONS EVERY 4 HOURS AS NEEDED FOR WHEEZING OR SHORTNESS OF BREATH 8.5 g 10  . amitriptyline (ELAVIL) 25 MG tablet Take 1 tablet (25 mg total) by mouth at bedtime. 90 tablet 3  . amLODipine (NORVASC) 5 MG tablet Take 1 tablet (5 mg total) by mouth daily. 30 tablet 3  . Ascorbic Acid (VITAMIN C) 1000 MG tablet Take 1,000 mg by mouth 2 (two) times daily.     Marland Kitchen atorvastatin (LIPITOR) 40 MG tablet TAKE 1 TABLET DAILY 90 tablet 0  . calcitRIOL (ROCALTROL) 0.5 MCG capsule Take 0.5 mcg by mouth daily.    . cetirizine (ZYRTEC) 10 MG tablet Take 10 mg by mouth daily.     Marland Kitchen ELIQUIS 2.5 MG TABS tablet SMARTSIG:1 Tablet(s) By Mouth Every 12 Hours    . fluticasone  furoate-vilanterol (BREO ELLIPTA) 100-25 MCG/INH AEPB Inhale 1 puff into the lungs daily. 180 each 3  . hydrALAZINE (APRESOLINE) 25 MG tablet TAKE 1 TABLET THREE TIMES A DAY 270 tablet 4  . insulin regular human CONCENTRATED (HUMULIN R) 500 UNIT/ML injection Inject up to 25 units twice a day, or as directed by physician 60 mL 3  . Insulin Syringe-Needle U-100 (BD INSULIN SYRINGE U/F) 31G X 5/16" 1 ML MISC Use for insulin injection up to four times daily for insulin dependent diabetes 200 each 4  . ipratropium-albuterol (DUONEB) 0.5-2.5 (3) MG/3ML SOLN INHALE 3 ML BY NEBULIZATION EVERY 4 HOURS AS NEEDED 360 mL 8  . levothyroxine (SYNTHROID) 100 MCG tablet Take 1 tablet (100 mcg total) by mouth daily. 90 tablet 3  . metoprolol tartrate (LOPRESSOR) 50 MG tablet Take 0.5 tablets (25 mg total) by mouth 2 (two) times daily.    . Misc. Devices (PULSE OXIMETER FOR FINGER) MISC 1 Device by Does not apply route daily as needed. 1 each 0  . pantoprazole (PROTONIX) 40 MG tablet TAKE 1 TABLET TWICE A DAY 180 tablet 3  . testosterone cypionate (DEPO-TESTOSTERONE) 200 MG/ML injection Inject 1 mL (200 mg total) into the muscle every 14 (fourteen) days. 6 mL 1  . ULORIC 80 MG TABS Take 80 mg by mouth daily.     . vitamin B-12 (CYANOCOBALAMIN) 1000 MCG tablet Take 1,000 mcg by mouth daily.     No current facility-administered medications on file prior to visit.    There are no Patient Instructions on file for this visit. No follow-ups on file.   Kris Hartmann, NP  This note was completed with Sales executive.  Any errors are purely unintentional.

## 2019-07-17 ENCOUNTER — Other Ambulatory Visit: Payer: Self-pay | Admitting: Family Medicine

## 2019-07-17 NOTE — Telephone Encounter (Signed)
Requested Prescriptions  Pending Prescriptions Disp Refills  . atorvastatin (LIPITOR) 40 MG tablet [Pharmacy Med Name: ATORVASTATIN TABS 40MG ] 90 tablet 0    Sig: TAKE 1 TABLET DAILY     Cardiovascular:  Antilipid - Statins Failed - 07/17/2019 12:26 AM      Failed - Total Cholesterol in normal range and within 360 days    Cholesterol, Total  Date Value Ref Range Status  02/20/2018 101 100 - 199 mg/dL Final   Cholesterol  Date Value Ref Range Status  03/14/2012 180 0 - 200 mg/dL Final         Failed - LDL in normal range and within 360 days    Ldl Cholesterol, Calc  Date Value Ref Range Status  03/14/2012 SEE COMMENT 0 - 100 mg/dL Final    Comment:    LDL/VLDL - Unable to report VLDL and LDL due to a  - Triglyceride value that is 400 mg/dL or   - greater....tpl    LDL Calculated  Date Value Ref Range Status  02/20/2018 35 0 - 99 mg/dL Final         Failed - HDL in normal range and within 360 days    HDL Cholesterol  Date Value Ref Range Status  03/14/2012 21 (L) 40 - 60 mg/dL Final   HDL  Date Value Ref Range Status  02/20/2018 24 (L) >39 mg/dL Final         Failed - Triglycerides in normal range and within 360 days    Triglycerides  Date Value Ref Range Status  02/20/2018 208 (H) 0 - 149 mg/dL Final  03/14/2012 594 (H) 0 - 200 mg/dL Final         Passed - Patient is not pregnant      Passed - Valid encounter within last 12 months    Recent Outpatient Visits          6 days ago Type 2 diabetes mellitus with diabetic neuropathy, with long-term current use of insulin [facility])   [facility] [name], MD   3 months ago Multifocal pneumonia   [facility] [name], MD   11 months ago [place], Donald E, MD   1 year ago Transition of care performed with sharing of clinical summary   [place], Clearnce Sorrel, [place]   1 year ago Shortness of breath   [facility] [name], MD      Future Appointments            In 3 months Fisher, Kirstie Peri, MD [facility], [facility]           Patient needs lab work at next office visit.

## 2019-07-25 ENCOUNTER — Other Ambulatory Visit: Payer: Self-pay | Admitting: Nephrology

## 2019-07-25 DIAGNOSIS — N186 End stage renal disease: Secondary | ICD-10-CM

## 2019-07-25 NOTE — Progress Notes (Signed)
Notified by nurse about patient having to stand to drain PD catheter, no fibrin, no constipation. X Ray abdomen to evaluate catheter placement

## 2019-07-26 ENCOUNTER — Other Ambulatory Visit: Payer: Self-pay | Admitting: Family Medicine

## 2019-07-26 ENCOUNTER — Ambulatory Visit
Admission: RE | Admit: 2019-07-26 | Discharge: 2019-07-26 | Disposition: A | Discharge status: Home / Self Care | Payer: Commercial Managed Care - PPO | Attending: Nephrology | Admitting: Nephrology

## 2019-07-26 ENCOUNTER — Ambulatory Visit
Admission: RE | Admit: 2019-07-26 | Discharge: 2019-07-26 | Disposition: A | Discharge status: Home / Self Care | Payer: Commercial Managed Care - PPO | Source: Ambulatory Visit | Attending: Nephrology | Admitting: Nephrology

## 2019-07-26 ENCOUNTER — Other Ambulatory Visit: Payer: Self-pay

## 2019-07-26 DIAGNOSIS — N186 End stage renal disease: Secondary | ICD-10-CM | POA: Insufficient documentation

## 2019-07-26 DIAGNOSIS — Z992 Dependence on renal dialysis: Secondary | ICD-10-CM | POA: Diagnosis present

## 2019-07-30 NOTE — Telephone Encounter (Signed)
Error

## 2019-08-01 ENCOUNTER — Other Ambulatory Visit: Payer: Self-pay | Admitting: Family Medicine

## 2019-08-01 DIAGNOSIS — E034 Atrophy of thyroid (acquired): Secondary | ICD-10-CM

## 2019-08-01 NOTE — Telephone Encounter (Signed)
Requested Prescriptions  Pending Prescriptions Disp Refills  . levothyroxine (SYNTHROID) 100 MCG tablet [Pharmacy Med Name: L-THYROXINE (SYNTHROID) TABS 100MCG] 90 tablet 2    Sig: TAKE 1 TABLET DAILY     Endocrinology:  Hypothyroid Agents Failed - 08/01/2019 12:32 AM      Failed - TSH needs to be rechecked within 3 months after an abnormal result. Refill until TSH is due.      Passed - TSH in normal range and within 360 days    TSH  Date Value Ref Range Status  03/04/2019 2.518 0.350 - 4.500 uIU/mL Final    Comment:    Performed by a 3rd Generation assay with a functional sensitivity of <=0.01 uIU/mL. Performed at [facility], [address] [place], [place] [address]   02/20/2018 5.340 (H) 0.450 - 4.500 uIU/mL Final         Passed - Valid encounter within last 12 months    Recent Outpatient Visits          3 weeks ago Type 2 diabetes mellitus with diabetic neuropathy, with long-term current use of insulin [facility])   [place], MD   4 months ago Multifocal pneumonia   [facility] [name], MD   1 year ago [place], Donald E, MD   1 year ago Transition of care performed with sharing of clinical summary   [place], Clearnce Sorrel, [place]   1 year ago Shortness of breath   [facility] [name], MD      Future Appointments            In 3 months Fisher, Kirstie Peri, MD [facility], [place]

## 2019-08-02 LAB — HM DIABETES EYE EXAM

## 2019-08-20 ENCOUNTER — Telehealth (INDEPENDENT_AMBULATORY_CARE_PROVIDER_SITE_OTHER): Payer: Self-pay

## 2019-08-20 NOTE — Telephone Encounter (Signed)
Spoke with the patient and he is now scheduled with Dr. Lucky Cowboy for a permcath removal on 08/23/19 with a 10:30 am arrival time to the [facility]. Patient will do covid testing on 08/21/19 between 8-1 pm at the [place]. Pre-procedure instructions were discussed with the patient.

## 2019-08-21 ENCOUNTER — Other Ambulatory Visit: Payer: Self-pay

## 2019-08-21 ENCOUNTER — Other Ambulatory Visit
Admission: RE | Admit: 2019-08-21 | Discharge: 2019-08-21 | Disposition: A | Discharge status: Home / Self Care | Payer: Commercial Managed Care - PPO | Source: Ambulatory Visit | Attending: Vascular Surgery | Admitting: Vascular Surgery

## 2019-08-21 DIAGNOSIS — Z01812 Encounter for preprocedural laboratory examination: Secondary | ICD-10-CM | POA: Insufficient documentation

## 2019-08-21 DIAGNOSIS — Z20822 Contact with and (suspected) exposure to covid-19: Secondary | ICD-10-CM | POA: Diagnosis not present

## 2019-08-21 LAB — SARS CORONAVIRUS 2 (TAT 6-24 HRS): SARS Coronavirus 2: NEGATIVE

## 2019-08-22 ENCOUNTER — Other Ambulatory Visit (INDEPENDENT_AMBULATORY_CARE_PROVIDER_SITE_OTHER): Payer: Self-pay | Admitting: Nurse Practitioner

## 2019-08-22 NOTE — Telephone Encounter (Signed)
Patient called to cancel his permcath removal on 08/23/19 per his nephrologist. Patient has been canceled.

## 2019-08-23 ENCOUNTER — Encounter: Admission: RE | Payer: Self-pay | Source: Home / Self Care

## 2019-08-23 ENCOUNTER — Ambulatory Visit
Admission: RE | Admit: 2019-08-23 | Payer: Commercial Managed Care - PPO | Source: Home / Self Care | Admitting: Vascular Surgery

## 2019-08-23 DIAGNOSIS — N186 End stage renal disease: Secondary | ICD-10-CM

## 2019-08-23 SURGERY — DIALYSIS/PERMA CATHETER REMOVAL
Anesthesia: LOCAL

## 2019-09-18 DIAGNOSIS — E782 Mixed hyperlipidemia: Secondary | ICD-10-CM | POA: Diagnosis not present

## 2019-09-18 DIAGNOSIS — I480 Paroxysmal atrial fibrillation: Secondary | ICD-10-CM | POA: Diagnosis not present

## 2019-09-18 DIAGNOSIS — E1122 Type 2 diabetes mellitus with diabetic chronic kidney disease: Secondary | ICD-10-CM | POA: Diagnosis not present

## 2019-09-18 DIAGNOSIS — N186 End stage renal disease: Secondary | ICD-10-CM | POA: Diagnosis not present

## 2019-09-18 DIAGNOSIS — R0789 Other chest pain: Secondary | ICD-10-CM | POA: Diagnosis not present

## 2019-09-18 DIAGNOSIS — Z6841 Body Mass Index (BMI) 40.0 and over, adult: Secondary | ICD-10-CM | POA: Diagnosis not present

## 2019-09-18 DIAGNOSIS — E1159 Type 2 diabetes mellitus with other circulatory complications: Secondary | ICD-10-CM | POA: Diagnosis not present

## 2019-09-18 DIAGNOSIS — R0602 Shortness of breath: Secondary | ICD-10-CM | POA: Diagnosis not present

## 2019-09-18 DIAGNOSIS — I10 Essential (primary) hypertension: Secondary | ICD-10-CM | POA: Diagnosis not present

## 2019-09-18 DIAGNOSIS — I5032 Chronic diastolic (congestive) heart failure: Secondary | ICD-10-CM | POA: Diagnosis not present

## 2019-09-18 DIAGNOSIS — I132 Hypertensive heart and chronic kidney disease with heart failure and with stage 5 chronic kidney disease, or end stage renal disease: Secondary | ICD-10-CM | POA: Diagnosis not present

## 2019-09-18 DIAGNOSIS — N185 Chronic kidney disease, stage 5: Secondary | ICD-10-CM | POA: Diagnosis not present

## 2019-09-18 DIAGNOSIS — Z992 Dependence on renal dialysis: Secondary | ICD-10-CM | POA: Diagnosis not present

## 2019-10-01 DIAGNOSIS — Z992 Dependence on renal dialysis: Secondary | ICD-10-CM | POA: Diagnosis not present

## 2019-10-01 DIAGNOSIS — N186 End stage renal disease: Secondary | ICD-10-CM | POA: Diagnosis not present

## 2019-10-08 ENCOUNTER — Telehealth (INDEPENDENT_AMBULATORY_CARE_PROVIDER_SITE_OTHER): Payer: Self-pay

## 2019-10-08 ENCOUNTER — Other Ambulatory Visit
Admission: RE | Admit: 2019-10-08 | Discharge: 2019-10-08 | Disposition: A | Discharge status: Home / Self Care | Payer: Commercial Managed Care - PPO | Source: Ambulatory Visit | Attending: Vascular Surgery | Admitting: Vascular Surgery

## 2019-10-08 ENCOUNTER — Other Ambulatory Visit: Payer: Self-pay

## 2019-10-08 ENCOUNTER — Other Ambulatory Visit (INDEPENDENT_AMBULATORY_CARE_PROVIDER_SITE_OTHER): Payer: Self-pay | Admitting: Nurse Practitioner

## 2019-10-08 DIAGNOSIS — Z20822 Contact with and (suspected) exposure to covid-19: Secondary | ICD-10-CM | POA: Insufficient documentation

## 2019-10-08 DIAGNOSIS — Z01812 Encounter for preprocedural laboratory examination: Secondary | ICD-10-CM | POA: Diagnosis not present

## 2019-10-08 LAB — SARS CORONAVIRUS 2 (TAT 6-24 HRS): SARS Coronavirus 2: NEGATIVE

## 2019-10-08 NOTE — Telephone Encounter (Signed)
Spoke with the patient and he is scheduled with Dr. Delana Meyer for permcath exchange on 10/09/19 with a 2:15 pm arrival time to the [facility]. Patient will do covid testing on 10/08/19 between 8-1 pm at the [place]. Pre-procedure instructions were discussed with the patient.

## 2019-10-09 ENCOUNTER — Encounter: Payer: Self-pay | Admitting: Vascular Surgery

## 2019-10-09 ENCOUNTER — Other Ambulatory Visit: Payer: Self-pay

## 2019-10-09 ENCOUNTER — Ambulatory Visit
Admission: RE | Admit: 2019-10-09 | Discharge: 2019-10-09 | Disposition: A | Discharge status: Home / Self Care | Payer: Commercial Managed Care - PPO | Attending: Vascular Surgery | Admitting: Vascular Surgery

## 2019-10-09 ENCOUNTER — Encounter
Admission: RE | Disposition: A | Discharge status: Home / Self Care | Payer: Self-pay | Source: Home / Self Care | Attending: Vascular Surgery

## 2019-10-09 DIAGNOSIS — T8249XA Other complication of vascular dialysis catheter, initial encounter: Secondary | ICD-10-CM | POA: Diagnosis present

## 2019-10-09 DIAGNOSIS — Y841 Kidney dialysis as the cause of abnormal reaction of the patient, or of later complication, without mention of misadventure at the time of the procedure: Secondary | ICD-10-CM | POA: Insufficient documentation

## 2019-10-09 DIAGNOSIS — I120 Hypertensive chronic kidney disease with stage 5 chronic kidney disease or end stage renal disease: Secondary | ICD-10-CM | POA: Diagnosis not present

## 2019-10-09 DIAGNOSIS — Z992 Dependence on renal dialysis: Secondary | ICD-10-CM

## 2019-10-09 DIAGNOSIS — N186 End stage renal disease: Secondary | ICD-10-CM | POA: Insufficient documentation

## 2019-10-09 DIAGNOSIS — Z87891 Personal history of nicotine dependence: Secondary | ICD-10-CM | POA: Diagnosis not present

## 2019-10-09 DIAGNOSIS — Z881 Allergy status to other antibiotic agents status: Secondary | ICD-10-CM | POA: Diagnosis not present

## 2019-10-09 DIAGNOSIS — E1122 Type 2 diabetes mellitus with diabetic chronic kidney disease: Secondary | ICD-10-CM | POA: Diagnosis not present

## 2019-10-09 DIAGNOSIS — T82898A Other specified complication of vascular prosthetic devices, implants and grafts, initial encounter: Secondary | ICD-10-CM

## 2019-10-09 HISTORY — PX: DIALYSIS/PERMA CATHETER INSERTION: CATH118288

## 2019-10-09 LAB — POTASSIUM (ARMC VASCULAR LAB ONLY): Potassium (ARMC vascular lab): 4.2 (ref 3.5–5.1)

## 2019-10-09 LAB — GLUCOSE, CAPILLARY
Glucose-Capillary: 172 mg/dL — ABNORMAL HIGH (ref 70–99)
Glucose-Capillary: 171 mg/dL — ABNORMAL HIGH (ref 70–99)

## 2019-10-09 SURGERY — DIALYSIS/PERMA CATHETER INSERTION
Anesthesia: Moderate Sedation

## 2019-10-09 MED ORDER — FENTANYL CITRATE (PF) 100 MCG/2ML IJ SOLN
INTRAMUSCULAR | Status: AC
  Filled 2019-10-09: qty 2

## 2019-10-09 MED ORDER — FENTANYL CITRATE (PF) 100 MCG/2ML IJ SOLN
INTRAMUSCULAR | Status: DC | PRN
  Administered 2019-10-09: 50 ug via INTRAVENOUS
  Administered 2019-10-09 (×3): 25 ug via INTRAVENOUS

## 2019-10-09 MED ORDER — MIDAZOLAM HCL 5 MG/5ML IJ SOLN
INTRAMUSCULAR | Status: AC
  Filled 2019-10-09: qty 5

## 2019-10-09 MED ORDER — HYDROMORPHONE HCL 1 MG/ML IJ SOLN: 1.0000 mg | Freq: Once | INTRAMUSCULAR | Status: DC | PRN

## 2019-10-09 MED ORDER — CEFAZOLIN SODIUM-DEXTROSE 1-4 GM/50ML-% IV SOLN
INTRAVENOUS | Status: AC
  Filled 2019-10-09: qty 50

## 2019-10-09 MED ORDER — ONDANSETRON HCL 4 MG/2ML IJ SOLN: 4.0000 mg | Freq: Four times a day (QID) | INTRAMUSCULAR | Status: DC | PRN

## 2019-10-09 MED ORDER — MIDAZOLAM HCL 2 MG/2ML IJ SOLN
INTRAMUSCULAR | Status: DC | PRN
  Administered 2019-10-09: 1 mg via INTRAVENOUS
  Administered 2019-10-09 (×2): 0.5 mg via INTRAVENOUS
  Administered 2019-10-09: 2 mg via INTRAVENOUS

## 2019-10-09 MED ORDER — SODIUM CHLORIDE 0.9 % IV SOLN: INTRAVENOUS | Status: DC

## 2019-10-09 MED ORDER — CEFAZOLIN SODIUM-DEXTROSE 1-4 GM/50ML-% IV SOLN: 1.0000 g | Freq: Once | INTRAVENOUS | Status: DC

## 2019-10-09 MED ORDER — FAMOTIDINE 20 MG PO TABS: 40.0000 mg | ORAL_TABLET | Freq: Once | ORAL | Status: DC | PRN

## 2019-10-09 MED ORDER — MIDAZOLAM HCL 2 MG/ML PO SYRP: 8.0000 mg | ORAL_SOLUTION | Freq: Once | ORAL | Status: DC | PRN

## 2019-10-09 MED ORDER — HEPARIN SODIUM (PORCINE) 1000 UNIT/ML IJ SOLN
INTRAMUSCULAR | Status: AC
  Filled 2019-10-09: qty 1

## 2019-10-09 MED ORDER — DIPHENHYDRAMINE HCL 50 MG/ML IJ SOLN: 50.0000 mg | Freq: Once | INTRAMUSCULAR | Status: DC | PRN

## 2019-10-09 MED ORDER — METHYLPREDNISOLONE SODIUM SUCC 125 MG IJ SOLR: 125.0000 mg | Freq: Once | INTRAMUSCULAR | Status: DC | PRN

## 2019-10-09 SURGICAL SUPPLY — 5 items
CATH PALINDROME-SP 14.5FX23 (CATHETERS) ×1 IMPLANT
GUIDEWIRE SUPER STIFF .035X180 (WIRE) ×1 IMPLANT
PACK ANGIOGRAPHY (CUSTOM PROCEDURE TRAY) ×1 IMPLANT
SUT MNCRL AB 4-0 PS2 18 (SUTURE) ×1 IMPLANT
SUT PROLENE 0 CT 1 30 (SUTURE) ×1 IMPLANT

## 2019-10-09 NOTE — H&P (Signed)
[facility] Admission History & Physical  MRN : [id number]  Patrick Hurst is a 64 y.o. (June 02, 1955) male who presents with chief complaint of No chief complaint on file. Marland Kitchen  History of Present Illness:  I am asked to evaluate the patient by the dialysis center. The patient was sent here because they were unable to achieve adequate dialysis this past weekend.  He has a peritoneal dialysis catheter which is not been draining well as well as a tunneled catheter which is no longer functioning properly for hemodialysis.  Furthermore, the Center states they were unable to aspirate either lumen of the catheter yesterday.  This problem has been getting worse for about 1 week. The patient is unaware of any other change.   Patient denies pain or tenderness overlying the access.  There is no pain with dialysis.  Patient denies fevers or shaking chills while on dialysis.    The patient is not chronically hypotensive on dialysis.   Current Facility-Administered Medications  Medication Dose Route Frequency Provider Last Rate Last Admin  . 0.9 %  sodium chloride infusion   Intravenous Continuous Kris Hartmann, NP 10 mL/hr at 10/09/19 1446 New Bag at 10/09/19 1446  . ceFAZolin (ANCEF) 1-4 GM/50ML-% IVPB           . ceFAZolin (ANCEF) IVPB 1 g/50 mL premix  1 g Intravenous Once Eulogio Ditch E, NP      . diphenhydrAMINE (BENADRYL) injection 50 mg  50 mg Intravenous Once PRN Kris Hartmann, NP      . famotidine (PEPCID) tablet 40 mg  40 mg Oral Once PRN Kris Hartmann, NP      . fentaNYL (SUBLIMAZE) 100 MCG/2ML injection           . heparin sodium (porcine) 1000 UNIT/ML injection           . HYDROmorphone (DILAUDID) injection 1 mg  1 mg Intravenous Once PRN Eulogio Ditch E, NP      . methylPREDNISolone sodium succinate (SOLU-MEDROL) 125 mg/2 mL injection 125 mg  125 mg Intravenous Once PRN Eulogio Ditch E, NP      . midazolam (VERSED) 2 MG/ML syrup 8 mg  8 mg Oral Once PRN Kris Hartmann, NP      . midazolam (VERSED) 5 MG/5ML injection           . ondansetron (ZOFRAN) injection 4 mg  4 mg Intravenous Q6H PRN Kris Hartmann, NP         Past Surgical History:  Procedure Laterality Date  . CATARACT EXTRACTION Bilateral 2014 and 2015  . CHOLECYSTECTOMY  04/28/2011   Laproscopic; Dr. Pat Patrick  . DIALYSIS/PERMA CATHETER INSERTION N/A 05/24/2019   Procedure: DIALYSIS/PERMA CATHETER INSERTION;  Surgeon: Algernon Huxley, MD;  Location: [facility];  Service: Cardiovascular;  Laterality: N/A;  . EYE SURGERY    . GALLBLADDER SURGERY    . LASIK Bilateral 2019   medical  . SHOULDER SURGERY Right 2012   Dr. Leanor Kail    Social History Social History   Tobacco Use  . Smoking status: Former Smoker    Packs/day: 1.00    Years: 15.00    Pack years: 15.00    Types: Cigarettes    Quit date: 05/03/1978    Years since quitting: 41.4  . Smokeless tobacco: Never Used  . Tobacco comment: started smoking at age 46  Substance Use Topics  . Alcohol use: No    Alcohol/week:  0.0 standard drinks  . Drug use: No    Family History Family History  Problem Relation Age of Onset  . COPD Mother   . Cancer Mother   . Heart disease Father     No family history of bleeding or clotting disorders, autoimmune disease or porphyria  Allergies  Allergen Reactions  . Mucinex [Guaifenesin Er] Swelling    Throat swelling, increases heart rate  . Levaquin [Levofloxacin] Palpitations     REVIEW OF SYSTEMS (Negative unless checked)  Constitutional: [] Weight loss  [] Fever  [] Chills Cardiac: [] Chest pain   [] Chest pressure   [] Palpitations   [] Shortness of breath when laying flat   [] Shortness of breath at rest   [x] Shortness of breath with exertion. Vascular:  [] Pain in legs with walking   [] Pain in legs at rest   [] Pain in legs when laying flat   [] Claudication   [] Pain in feet when walking  [] Pain in feet at rest  [] Pain in feet when laying flat   [] History of DVT    [] Phlebitis   [] Swelling in legs   [] Varicose veins   [] Non-healing ulcers Pulmonary:   [] Uses home oxygen   [] Productive cough   [] Hemoptysis   [] Wheeze  [] COPD   [] Asthma Neurologic:  [] Dizziness  [] Blackouts   [] Seizures   [] History of stroke   [] History of TIA  [] Aphasia   [] Temporary blindness   [] Dysphagia   [] Weakness or numbness in arms   [] Weakness or numbness in legs Musculoskeletal:  [] Arthritis   [] Joint swelling   [] Joint pain   [] Low back pain Hematologic:  [] Easy bruising  [] Easy bleeding   [] Hypercoagulable state   [] Anemic  [] Hepatitis Gastrointestinal:  [] Blood in stool   [] Vomiting blood  [] Gastroesophageal reflux/heartburn   [] Difficulty swallowing. Genitourinary:  [x] Chronic kidney disease   [] Difficult urination  [] Frequent urination  [] Burning with urination   [] Blood in urine Skin:  [] Rashes   [] Ulcers   [] Wounds Psychological:  [] History of anxiety   []  History of major depression.  Physical Examination  Vitals:   10/09/19 1438  BP: (!) 146/70  Pulse: 62  Resp: 14  Temp: 98.4 F (36.9 C)  SpO2: 93%  Weight: 136.1 kg  Height: 5\' 11"  (1.803 m)   Body mass index is 41.84 kg/m. Gen: WD/WN, NAD Head: [place]/AT, No temporalis wasting. Prominent temp pulse not noted. Ear/Nose/Throat: Hearing grossly intact, nares w/o erythema or drainage, oropharynx w/o Erythema/Exudate,  Eyes: Conjunctiva clear, sclera non-icteric Neck: Trachea midline.  No JVD.  Pulmonary:  Good air movement, respirations not labored, no use of accessory muscles.  Cardiac: RRR, normal S1, S2. Vascular: Right IJ tunneled catheter without tenderness or drainage Vessel Right Left  Radial Palpable Palpable  Gastrointestinal: soft, non-tender/non-distended. No guarding/reflex.  Musculoskeletal: M/S 5/5 throughout.  Extremities without ischemic changes.  No deformity or atrophy.  Neurologic: Sensation grossly intact in extremities.  Symmetrical.  Speech is fluent. Motor exam as listed  above. Psychiatric: Judgment intact, Mood & affect appropriate for pt's clinical situation. Dermatologic: No rashes or ulcers noted.  No cellulitis or open wounds. Lymph : No Cervical, Axillary, or Inguinal lymphadenopathy.   CBC Lab Results  Component Value Date   WBC 6.2 06/13/2019   HGB 9.9 (L) 06/21/2019   HCT 29.0 (L) 06/21/2019   MCV 90.2 06/13/2019   PLT 115 (L) 06/13/2019    BMET    Component Value Date/Time   NA 139 06/21/2019 1157   NA 145 (H) 04/04/2018 1609   NA 137 05/04/2013  1240   K 4.8 06/21/2019 1157   K 4.4 05/04/2013 1240   CL 99 06/21/2019 1157   CL 100 05/04/2013 1240   CO2 27 06/13/2019 0923   CO2 32 05/04/2013 1240   GLUCOSE 162 (H) 06/21/2019 1157   GLUCOSE 123 (H) 05/04/2013 1240   BUN 25 (H) 06/21/2019 1157   BUN 81 (HH) 04/04/2018 1609   BUN 25 (H) 05/04/2013 1240   CREATININE 3.60 (H) 06/21/2019 1157   CREATININE 0.95 05/04/2013 1240   CALCIUM 8.4 (L) 06/13/2019 0923   CALCIUM 9.1 05/04/2013 1240   GFRNONAA 14 (L) 06/13/2019 0923   GFRNONAA >60 05/04/2013 1240   GFRAA 16 (L) 06/13/2019 0923   GFRAA >60 05/04/2013 1240   CrCl cannot be calculated (Patient's most recent lab result is older than the maximum 21 days allowed.).  COAG Lab Results  Component Value Date   INR 1.1 06/13/2019   INR 1.0 03/01/2019   INR 0.93 11/14/2015    Radiology No results found.  Assessment/Plan 1.  Complication dialysis device with thrombosis AV access:  Patient's right IJ tunneled catheter is thrombosed. The patient will undergo exchange of the catheter same venous access using interventional techniques.  The risks and benefits were described to the patient.  All questions were answered.  The patient agrees to proceed with intervention.  2.  End-stage renal disease requiring hemodialysis:  Patient will continue dialysis therapy without further interruption if a successful exchange is not achieved then new site will be found for tunneled catheter  placement. Dialysis has already been arranged since the patient missed their previous session 3.  Hypertension:  Patient will continue medical management; nephrology is following no changes in oral medications. 4. Diabetes mellitus:  Glucose will be monitored and oral medications been held this morning once the patient has undergone the patient's procedure po intake will be reinitiated and again Accu-Cheks will be used to assess the blood glucose level and treat as needed. The patient will be restarted on the patient's usual hypoglycemic regime 5.  Congestive heart failure:  EKG will be monitored. Nitrates will be used if needed to treat chest pain. The patient's oral cardiac medications will be continued.    Hortencia Pilar, MD  10/09/2019 4:12 PM

## 2019-10-09 NOTE — Discharge Instructions (Signed)
[facility] will call you tomorrow with follow up appointment [phone]                                                                                                                                                                                                                                  Tunneled Catheter Insertion, Care After This sheet gives you information about how to care for yourself after your procedure. Your health care provider may also give you more specific instructions. If you have problems or questions, contact your health care provider. What can I expect after the procedure? After the procedure, it is common to have:  Some mild redness, bruising, swelling, and pain around your catheter site.  A small amount of blood or clear fluid coming from your incisions. Follow these instructions at home: Incision care   Follow instructions from your health care provider about how to take care of your incisions. Make sure you: ? Wash your hands with soap and water before and after you change your bandages (dressings). If soap and water are not available, use hand sanitizer. ? Change your dressings as told by your health care provider. Wash the area around your incisions with a germ-killing (antiseptic) solution when you change your dressings. ? Leave stitches (sutures), skin glue, or adhesive strips in place. These skin closures may need to stay in place for 2 weeks or longer. If adhesive strip edges start to loosen and curl up, you may trim the loose edges. Do not remove adhesive strips completely unless your health care provider tells you to do that.  Keep your dressings clean and dry.  Check your incision areas every day for signs of infection. Check for: ? More redness, swelling, or pain. ? More fluid or blood. ? Warmth. ? Pus or a bad smell. Catheter care   Wash your hands with soap and water before and after caring for your catheter. If soap and water are not available, use  hand sanitizer.  Keep your catheter site clean and dry.  Apply an antibiotic ointment to your catheter site as told by your health care provider.  Flush your catheter as told by your health care provider. This helps prevent it from becoming clogged.  Do not open the caps on the ends of the catheter.  Do not pull on your catheter. Medicines  Take over-the-counter and prescription medicines only as told by your health care provider.  If you  were prescribed an antibiotic medicine, take it as told by your health care provider. Do not stop taking the antibiotic even if you start to feel better. Activity  Return to your normal activities as told by your health care provider. Ask your health care provider what activities are safe for you.  Follow any other activity restrictions as instructed by your health care provider.  Do not lift anything that is heavier than 10 lb (4.5 kg), or the limit that you are told, until your health care provider says that it is safe. Driving  Do not drive until your health care provider approves.  Ask your health care provider if the medicine prescribed to you requires you to avoid driving or using heavy machinery. General instructions  Follow your health care provider's specific instructions for the type of catheter that you have.  Do not take baths, swim, or use a hot tub until your health care provider approves. Ask your health care provider if you may take showers.  Keep all follow-up visits as told by your health care provider. This is important. Contact a health care provider if:  You feel unusually weak or nauseous.  You have more redness, swelling, or pain at your incisions or around the area where your catheter is inserted.  Your catheter is not working properly.  You are unable to flush your catheter. Get help right away if:  Your catheter develops a hole or it breaks.  You have pain or swelling when fluids or medicines are being given  through the catheter.  Fluid is leaking from the catheter, under the dressing, or around the dressing.  Your catheter comes loose or gets pulled completely out. If this happens, press on your catheter site firmly with a clean cloth until you can get medical help.  You have swelling in your shoulder, neck, chest, or face.  You have chest pain or difficulty breathing.  You feel dizzy or light-headed.  You have pus or a bad smell coming from your catheter site.  You have a fever or chills.  Your catheter site feels warm to the touch.  You develop bleeding from your catheter or your insertion site, and your bleeding does not stop. Summary  After the procedure, it is common to have mild redness, swelling, and pain around your catheter site.  Return to your normal activities as told by your health care provider. Ask your health care provider what activities are safe for you.  Follow your health care provider's specific instructions for the type of catheter that you have.  Keep your catheter site and your dressings clean and dry.  Contact a health care provider if your catheter is not working properly. Get help right away if you have chest pain, fever, or difficulty breathing. This information is not intended to replace advice given to you by your health care provider. Make sure you discuss any questions you have with your health care provider. Document Revised: 04/11/2018 Document Reviewed: 04/11/2018 Elsevier Patient Education  [place].

## 2019-10-09 NOTE — H&P (Signed)
[facility] Admission History & Physical  MRN : [id number]  Patrick Hurst is a 64 y.o. (01-28-1956) male who presents with chief complaint of scheduled PermCath insertion.  History of Present Illness:  I am asked to evaluate the patient by the dialysis center. The patient was sent here because they were unable to achieve adequate dialysis yesterday. Furthermore the Center states they were unable to aspirate either lumen of the catheter yesterday.  This problem has been getting worse for about one week. The patient is unaware of any other change.   Patient denies pain or tenderness overlying the access.  There is no pain with dialysis.  Patient denies fevers or shaking chills while on dialysis.  Denies any fever, nausea vomiting.  Denies any shortness of breath or chest pain.  Patient presents for scheduled PermCath exchange.  Current Facility-Administered Medications  Medication Dose Route Frequency Provider Last Rate Last Admin   0.9 %  sodium chloride infusion   Intravenous Continuous Kris Hartmann, NP 10 mL/hr at 10/09/19 1446 New Bag at 10/09/19 1446   ceFAZolin (ANCEF) 1-4 GM/50ML-% IVPB            ceFAZolin (ANCEF) IVPB 1 g/50 mL premix  1 g Intravenous Once Kris Hartmann, NP       diphenhydrAMINE (BENADRYL) injection 50 mg  50 mg Intravenous Once PRN Kris Hartmann, NP       famotidine (PEPCID) tablet 40 mg  40 mg Oral Once PRN Kris Hartmann, NP       HYDROmorphone (DILAUDID) injection 1 mg  1 mg Intravenous Once PRN Kris Hartmann, NP       methylPREDNISolone sodium succinate (SOLU-MEDROL) 125 mg/2 mL injection 125 mg  125 mg Intravenous Once PRN Kris Hartmann, NP       midazolam (VERSED) 2 MG/ML syrup 8 mg  8 mg Oral Once PRN Kris Hartmann, NP       ondansetron [facility]) injection 4 mg  4 mg Intravenous Q6H PRN Kris Hartmann, NP       Past Surgical History:  Procedure Laterality Date   CATARACT EXTRACTION Bilateral 2014 and 2015    CHOLECYSTECTOMY  04/28/2011   Laproscopic; Dr. Pat Patrick   DIALYSIS/PERMA CATHETER INSERTION N/A 05/24/2019   Procedure: DIALYSIS/PERMA CATHETER INSERTION;  Surgeon: Algernon Huxley, MD;  Location: [facility];  Service: Cardiovascular;  Laterality: N/A;   EYE SURGERY     GALLBLADDER SURGERY     LASIK Bilateral 2019   medical   SHOULDER SURGERY Right 2012   Dr. Leanor Kail   Social History Social History   Tobacco Use   Smoking status: Former Smoker    Packs/day: 1.00    Years: 15.00    Pack years: 15.00    Types: Cigarettes    Quit date: 05/03/1978    Years since quitting: 41.4   Smokeless tobacco: Never Used   Tobacco comment: started smoking at age 86  Substance Use Topics   Alcohol use: No    Alcohol/week: 0.0 standard drinks   Drug use: No   Family History Family History  Problem Relation Age of Onset   COPD Mother    Cancer Mother    Heart disease Father   No family history of bleeding or clotting disorders, autoimmune disease or porphyria  Allergies  Allergen Reactions   Mucinex [Guaifenesin Er] Swelling    Throat swelling, increases heart rate   Levaquin [Levofloxacin] Palpitations  REVIEW OF SYSTEMS (Negative unless checked)  Constitutional: [] Weight loss  [] Fever  [] Chills Cardiac: [] Chest pain   [] Chest pressure   [] Palpitations   [] Shortness of breath when laying flat   [] Shortness of breath at rest   [x] Shortness of breath with exertion. Vascular:  [] Pain in legs with walking   [] Pain in legs at rest   [] Pain in legs when laying flat   [] Claudication   [] Pain in feet when walking  [] Pain in feet at rest  [] Pain in feet when laying flat   [] History of DVT   [] Phlebitis   [x] Swelling in legs   [] Varicose veins   [] Non-healing ulcers Pulmonary:   [] Uses home oxygen   [] Productive cough   [] Hemoptysis   [] Wheeze  [] COPD   [] Asthma Neurologic:  [] Dizziness  [] Blackouts   [] Seizures   [] History of stroke   [] History of TIA  [] Aphasia    [] Temporary blindness   [] Dysphagia   [] Weakness or numbness in arms   [] Weakness or numbness in legs Musculoskeletal:  [x] Arthritis   [] Joint swelling   [] Joint pain   [] Low back pain Hematologic:  [] Easy bruising  [] Easy bleeding   [] Hypercoagulable state   [] Anemic  [] Hepatitis Gastrointestinal:  [] Blood in stool   [] Vomiting blood  [] Gastroesophageal reflux/heartburn   [] Difficulty swallowing. Genitourinary:  [x] Chronic kidney disease   [] Difficult urination  [] Frequent urination  [] Burning with urination   [] Blood in urine Skin:  [] Rashes   [] Ulcers   [] Wounds Psychological:  [] History of anxiety   []  History of major depression.  Physical Examination  Vitals:   10/09/19 1438  BP: (!) 146/70  Pulse: 62  Resp: 14  Temp: 98.4 F (36.9 C)  SpO2: 93%  Weight: 136.1 kg  Height: 5\' 11"  (1.803 m)   Body mass index is 41.84 kg/m. Gen: WD/WN, NAD Head: [place]/AT, No temporalis wasting. Prominent temp pulse not noted. Ear/Nose/Throat: Hearing grossly intact, nares w/o erythema or drainage, oropharynx w/o Erythema/Exudate,  Eyes: Conjunctiva clear, sclera non-icteric Neck: Trachea midline.  No JVD.  Pulmonary:  Good air movement, respirations not labored, no use of accessory muscles.  Cardiac: RRR, normal S1, S2. Vascular: tunneled catheter without tenderness or drainage Vessel Right Left  Radial Palpable Palpable  Gastrointestinal: soft, non-tender/non-distended. No guarding/reflex.  Musculoskeletal: M/S 5/5 throughout.  Extremities without ischemic changes.  No deformity or atrophy.  Neurologic: Sensation grossly intact in extremities.  Symmetrical.  Speech is fluent. Motor exam as listed above. Psychiatric: Judgment intact, Mood & affect appropriate for pt's clinical situation. Dermatologic: No rashes or ulcers noted.  No cellulitis or open wounds. Lymph : No Cervical, Axillary, or Inguinal lymphadenopathy.  CBC Lab Results  Component Value Date   WBC 6.2 06/13/2019   HGB 9.9 (L)  06/21/2019   HCT 29.0 (L) 06/21/2019   MCV 90.2 06/13/2019   PLT 115 (L) 06/13/2019   BMET    Component Value Date/Time   NA 139 06/21/2019 1157   NA 145 (H) 04/04/2018 1609   NA 137 05/04/2013 1240   K 4.8 06/21/2019 1157   K 4.4 05/04/2013 1240   CL 99 06/21/2019 1157   CL 100 05/04/2013 1240   CO2 27 06/13/2019 0923   CO2 32 05/04/2013 1240   GLUCOSE 162 (H) 06/21/2019 1157   GLUCOSE 123 (H) 05/04/2013 1240   BUN 25 (H) 06/21/2019 1157   BUN 81 (HH) 04/04/2018 1609   BUN 25 (H) 05/04/2013 1240   CREATININE 3.60 (H) 06/21/2019 1157   CREATININE 0.95 05/04/2013  1240   CALCIUM 8.4 (L) 06/13/2019 0923   CALCIUM 9.1 05/04/2013 1240   GFRNONAA 14 (L) 06/13/2019 0923   GFRNONAA >60 05/04/2013 1240   GFRAA 16 (L) 06/13/2019 0923   GFRAA >60 05/04/2013 1240   CrCl cannot be calculated (Patient's most recent lab result is older than the maximum 21 days allowed.).  COAG Lab Results  Component Value Date   INR 1.1 06/13/2019   INR 1.0 03/01/2019   INR 0.93 11/14/2015   Radiology No results found.  Assessment/Plan I am asked to evaluate the patient by the dialysis center. The patient was sent here because they were unable to achieve adequate dialysis yesterday through his PermCath.  1.  Complication dialysis device with thrombosis AV access:  Patient's  tunneled catheter is thrombosed. The patient will undergo exchange of the catheter same venous access using interventional techniques.  The risks and benefits were described to the patient.  All questions were answered.  The patient agrees to proceed with intervention.  2.  End-stage renal disease requiring hemodialysis:  Patient will continue dialysis therapy without further interruption if a successful exchange is not achieved then new site will be found for tunneled catheter placement. Dialysis has already been arranged since the patient missed their previous session 3.  Hypertension:  Patient will continue medical management;  nephrology is following no changes in oral medications. 4. Diabetes mellitus:  Glucose will be monitored and oral medications been held this morning once the patient has undergone the patient's procedure po intake will be reinitiated and again Accu-Cheks will be used to assess the blood glucose level and treat as needed. The patient will be restarted on the patient's usual hypoglycemic regime  Discussed with Dr. Francene Castle, PA-C  10/09/2019 3:17 PM

## 2019-10-09 NOTE — Op Note (Signed)
OPERATIVE NOTE   PROCEDURE: 1. Insertion/exchange of tunneled dialysis catheter right IJ approach same venous access.  PRE-OPERATIVE DIAGNOSIS: Nonfunction of existing tunneled dialysis catheter, End stage renal disease requiring hemodialysis   POST-OPERATIVE DIAGNOSIS: Same SURGEON: Hortencia Pilar  ANESTHESIA: Conscious sedation was administered under my direct supervision by the interventional radiology RN.  IV Versed plus fentanyl were utilized. Continuous ECG, pulse oximetry and blood pressure was monitored throughout the entire procedure.  Conscious sedation was for a total of 40 minutes.  ESTIMATED BLOOD LOSS: Minimal cc  CONTRAST USED:  None  FLUOROSCOPY TIME: 1.5 minutes  INDICATIONS:   Patrick Hurst is a 64 y.o.y.o. male who presents with poor flow and nonfunction of the tunneled dialysis catheter.  Adequate dialysis has not been possible.  DESCRIPTION: After obtaining full informed written consent, the patient was positioned supine. The right neck and chest wall was prepped and draped in a sterile fashion. The cuff is localized and using blunt and sharp dissection it is freed from the surrounding adhesions.  The existing catheter is then transected proximal to the cuff.  The guidewire is advanced without difficulty under fluoroscopy.  Dilators are passed over the wire as needed and the tunneled dialysis catheter is fed into the central venous system without difficulty.  Under fluoroscopy the catheter tip positioned at the atrial caval junction.  Both lumens aspirate and flush easily. After verification of smooth contour with proper tip position under fluoroscopy the catheter is packed with 5000 units of heparin per lumen.  Catheter secured to the skin of the right chest wall with 0 silk. A sterile dressing is applied with a Biopatch.  COMPLICATIONS: None  CONDITION: Good  Hortencia Pilar [facility] Office:  [phone]   10/09/2019,5:53 PM

## 2019-10-10 ENCOUNTER — Encounter: Payer: Self-pay | Admitting: Cardiology

## 2019-10-15 ENCOUNTER — Other Ambulatory Visit: Payer: Self-pay | Admitting: Family Medicine

## 2019-10-17 ENCOUNTER — Other Ambulatory Visit: Payer: Self-pay | Admitting: Family Medicine

## 2019-10-17 DIAGNOSIS — R0609 Other forms of dyspnea: Secondary | ICD-10-CM

## 2019-10-17 DIAGNOSIS — R0600 Dyspnea, unspecified: Secondary | ICD-10-CM

## 2019-10-17 MED ORDER — ALBUTEROL SULFATE HFA 108 (90 BASE) MCG/ACT IN AERS: 2.0000 | INHALATION_SPRAY | RESPIRATORY_TRACT | 3 refills | Status: DC | PRN

## 2019-10-17 NOTE — Telephone Encounter (Signed)
[facility] faxed refill request for the following medications:  albuterol (VENTOLIN HFA) 108 (90 Base) MCG/ACT inhaler   Last Rx: 09/28/2018 LOV: 07/11/2019 Please advise. Thanks TNP

## 2019-10-22 ENCOUNTER — Telehealth: Payer: Self-pay | Admitting: Pulmonary Disease

## 2019-10-22 DIAGNOSIS — G4733 Obstructive sleep apnea (adult) (pediatric): Secondary | ICD-10-CM

## 2019-10-22 NOTE — Telephone Encounter (Signed)
Called and spoke to pt, who is requesting a Rx for bipap supplies to be sent to [facility].  Pt stated that it was mentioned at his last OV that supplies would be ordered, however I do not see an order or mention of this.  Dr. Halford Chessman please advise if okay to order.

## 2019-10-25 ENCOUNTER — Telehealth: Payer: Self-pay

## 2019-10-25 DIAGNOSIS — R0600 Dyspnea, unspecified: Secondary | ICD-10-CM

## 2019-10-25 DIAGNOSIS — R0609 Other forms of dyspnea: Secondary | ICD-10-CM

## 2019-10-25 NOTE — Telephone Encounter (Signed)
Copied from [place] [phone]. Topic: General - Other >> Oct 25, 2019  9:42 AM Yvette Rack wrote: Reason for CRM: Hinton Dyer with [facility] requests to speak with CMA regarding albuterol (VENTOLIN HFA) 108 (90 Base) MCG/ACT inhaler. Cb# [phone] Reference# [id number]

## 2019-10-26 MED ORDER — ALBUTEROL SULFATE HFA 108 (90 BASE) MCG/ACT IN AERS: 2.0000 | INHALATION_SPRAY | RESPIRATORY_TRACT | 0 refills | Status: DC | PRN

## 2019-10-26 NOTE — Telephone Encounter (Signed)
Order has been placed to [facility] to renew bipap supplies.  Pt is aware and voiced his understanding.  Nothing further is needed.

## 2019-10-26 NOTE — Telephone Encounter (Signed)
Changed RX to a 90 day supply for mail order pharmacy.

## 2019-10-26 NOTE — Telephone Encounter (Signed)
Yes please

## 2019-10-26 NOTE — Telephone Encounter (Signed)
Tammy please advise if okay to renew cpap supplies, as Dr. Halford Chessman is currently out of the office.

## 2019-10-31 DIAGNOSIS — N186 End stage renal disease: Secondary | ICD-10-CM | POA: Diagnosis not present

## 2019-10-31 DIAGNOSIS — Z992 Dependence on renal dialysis: Secondary | ICD-10-CM | POA: Diagnosis not present

## 2019-11-01 ENCOUNTER — Other Ambulatory Visit (INDEPENDENT_AMBULATORY_CARE_PROVIDER_SITE_OTHER): Payer: Self-pay | Admitting: Vascular Surgery

## 2019-11-01 DIAGNOSIS — N186 End stage renal disease: Secondary | ICD-10-CM

## 2019-11-02 ENCOUNTER — Other Ambulatory Visit: Payer: Self-pay

## 2019-11-02 ENCOUNTER — Ambulatory Visit (INDEPENDENT_AMBULATORY_CARE_PROVIDER_SITE_OTHER): Payer: Commercial Managed Care - PPO

## 2019-11-02 ENCOUNTER — Ambulatory Visit (INDEPENDENT_AMBULATORY_CARE_PROVIDER_SITE_OTHER): Payer: Commercial Managed Care - PPO | Admitting: Vascular Surgery

## 2019-11-02 ENCOUNTER — Encounter (INDEPENDENT_AMBULATORY_CARE_PROVIDER_SITE_OTHER): Payer: Self-pay | Admitting: Vascular Surgery

## 2019-11-02 ENCOUNTER — Telehealth: Payer: Self-pay | Admitting: Family Medicine

## 2019-11-02 VITALS — BP 114/59 | HR 71 | Resp 16 | Wt 312.0 lb

## 2019-11-02 DIAGNOSIS — E1140 Type 2 diabetes mellitus with diabetic neuropathy, unspecified: Secondary | ICD-10-CM | POA: Diagnosis not present

## 2019-11-02 DIAGNOSIS — N186 End stage renal disease: Secondary | ICD-10-CM | POA: Diagnosis not present

## 2019-11-02 DIAGNOSIS — I10 Essential (primary) hypertension: Secondary | ICD-10-CM | POA: Diagnosis not present

## 2019-11-02 DIAGNOSIS — E785 Hyperlipidemia, unspecified: Secondary | ICD-10-CM

## 2019-11-02 DIAGNOSIS — Z992 Dependence on renal dialysis: Secondary | ICD-10-CM

## 2019-11-02 DIAGNOSIS — Z794 Long term (current) use of insulin: Secondary | ICD-10-CM

## 2019-11-02 MED ORDER — ALBUTEROL SULFATE HFA 108 (90 BASE) MCG/ACT IN AERS: 2.0000 | INHALATION_SPRAY | Freq: Four times a day (QID) | RESPIRATORY_TRACT | 4 refills | Status: DC | PRN

## 2019-11-02 NOTE — Patient Instructions (Signed)
AV Fistula Placement  Arteriovenous (AV) fistula placement is a surgical procedure to create a connection between a blood vessel that carries blood away from your heart (artery) and a blood vessel that returns blood to your heart (vein). This connection is called a fistula. It is often made in the forearm or upper arm. You may need this procedure if you are getting hemodialysis treatments for kidney disease. An AV fistula makes your vein larger and stronger over several months. This makes the vein a safe and easy spot to insert the needles that are used for hemodialysis. Tell a health care provider about:  Any allergies you have.  All medicines you are taking, including vitamins, herbs, eye drops, creams, and over-the-counter medicines.  Any problems you or family members have had with anesthetic medicines.  Any blood disorders you have.  Any surgeries you have had.  Any medical conditions you have or have had in the past. What are the risks? Generally, this is a safe procedure. However, problems may occur, including:  Infection.  Blood clot (thrombosis).  Reduced blood flow (stenosis).  Weakening or ballooning out of the fistula (aneurysm).  Bleeding.  Allergic reactions to medicines.  Nerve damage.  Swelling near the fistula (lymphedema).  Weakening of your heart (congestive heart failure).  Failure of the procedure. What happens before the procedure? Staying hydrated Follow instructions from your health care provider about hydration, which may include:  Up to 2 hours before the procedure - you may continue to drink clear liquids, such as water, clear fruit juice, black coffee, and plain tea.  Eating and drinking restrictions Follow instructions from your health care provider about eating and drinking, which may include:  8 hours before the procedure - stop eating heavy meals or foods, such as meat, fried foods, or fatty foods.  6 hours before the procedure - stop  eating light meals or foods, such as toast or cereal.  6 hours before the procedure - stop drinking milk or drinks that contain milk.  2 hours before the procedure - stop drinking clear liquids. Medicines Ask your health care provider about:  Changing or stopping your regular medicines. This is especially important if you are taking diabetes medicines or blood thinners.  Taking medicines such as aspirin and ibuprofen. These medicines can thin your blood. Do not take these medicines unless your health care provider tells you to take them.  Taking over-the-counter medicines, vitamins, herbs, and supplements. General instructions  Imaging tests of your arm may be done to find the best place for the fistula.  Plan to have someone take you home from the hospital or clinic.  Ask your health care provider how your surgical site will be marked or identified.  Ask your health care provider what steps will be taken to help prevent infection. These may include: ? Removing hair at the surgery site. ? Washing skin with a germ-killing soap. ? Taking antibiotic medicine.  Do not use any products that contain nicotine or tobacco for as long as possible before and after the procedure. These products include cigarettes, e-cigarettes, and chewing tobacco. If you need help quitting, ask your health care provider. What happens during the procedure?  An IV will be inserted into one of your veins.  You will be given one or more of the following: ? A medicine to help you relax (sedative). ? A medicine to numb the area (local anesthetic). ? A medicine to make you fall asleep (general anesthetic). ? A medicine that  is injected into an area of your body to numb everything below the injection site (regional anesthetic).  The fistula site will be cleaned with a germ-killing solution (antiseptic).  An incision will be made on the inner side of your arm.  A vein and an artery will be opened and connected  with stitches (sutures).  The incision will be closed with sutures or clips.  A bandage (dressing) will be placed over the area. The procedure may vary among health care providers and hospitals. What happens after the procedure?  Your blood pressure, heart rate, breathing rate, and blood oxygen level may be monitored until you leave the hospital or clinic.  Your fistula site will be checked for bleeding or swelling.  You will be given pain medicine as needed.  Do not drive for 24 hours if you were given a sedative during your procedure. Summary  Arteriovenous (AV) fistula placement is a surgical procedure to create a connection between a blood vessel that carries blood away from your heart (artery) and a blood vessel that returns blood to your heart (vein). This connection is called a fistula.  Follow instructions from your health care provider about eating and drinking before the procedure.  Ask your health care provider about changing or stopping your regular medicines before the procedure. This is especially important if you are taking diabetes medicines or blood thinners.  Do not drive for 24 hours if you were given a sedative during your procedure.  Plan to have someone take you home from the hospital or clinic. This information is not intended to replace advice given to you by your health care provider. Make sure you discuss any questions you have with your health care provider. Document Revised: 10/06/2018 Document Reviewed: 10/24/2017 Elsevier Patient Education  [place].

## 2019-11-02 NOTE — Progress Notes (Signed)
MRN : [id number]  Patrick Hurst is a 64 y.o. (04-08-1956) male who presents with chief complaint of  Chief Complaint  Patient presents with  . Follow-up    [facility] 3wk vein mapping  .  History of Present Illness: Patient returns today in follow up of his dialysis access.  He is using peritoneal dialysis, but this is not clearing the fluid enough and he is having to get hemodialysis about once a week.  He is currently using a catheter.  They have sent him over today to evaluate him for a fistula so that we can get the catheter out.  He feels well today.  He has no complaints.  His arterial studies today were normal in both upper extremities and his vein mapping showed large patent cephalic veins throughout both upper extremities that would be usable for fistula creation.  He is right-hand dominant.  Current Outpatient Medications  Medication Sig Dispense Refill  . albuterol (VENTOLIN HFA) 108 (90 Base) MCG/ACT inhaler Inhale 2 puffs into the lungs every 4 (four) hours as needed for wheezing. USE 2 INHALATIONS EVERY 4 HOURS AS NEEDED FOR WHEEZING OR SHORTNESS OF BREATH 54 g 0  . amiodarone (PACERONE) 200 MG tablet Take 200 mg by mouth daily.    Marland Kitchen amitriptyline (ELAVIL) 25 MG tablet TAKE 1 TABLET AT BEDTIME (Patient taking differently: Take 25 mg by mouth at bedtime. ) 90 tablet 3  . amLODipine (NORVASC) 5 MG tablet Take 1 tablet (5 mg total) by mouth daily. 30 tablet 3  . Ascorbic Acid (VITAMIN C) 1000 MG tablet Take 1,000 mg by mouth 2 (two) times daily.     Marland Kitchen atorvastatin (LIPITOR) 40 MG tablet TAKE 1 TABLET DAILY 90 tablet 0  . calcitRIOL (ROCALTROL) 0.5 MCG capsule Take 0.5 mcg by mouth daily.    . cetirizine (ZYRTEC) 10 MG tablet Take 10 mg by mouth daily.     Marland Kitchen ELIQUIS 2.5 MG TABS tablet Take 2.5 mg by mouth 2 (two) times daily.     . fluticasone furoate-vilanterol (BREO ELLIPTA) 100-25 MCG/INH AEPB Inhale 1 puff into the lungs daily. 180 each 3  . gentamicin cream (GARAMYCIN) 0.1 %  Apply 1 application topically daily.    . hydrALAZINE (APRESOLINE) 25 MG tablet TAKE 1 TABLET THREE TIMES A DAY (Patient taking differently: Take 25 mg by mouth 3 (three) times daily. ) 270 tablet 4  . insulin regular human CONCENTRATED (HUMULIN R) 500 UNIT/ML injection Inject up to 25 units twice a day, or as directed by physician (Patient taking differently: Inject 50 Units into the skin 2 (two) times daily with a meal. or as directed by physician) 60 mL 3  . Insulin Syringe-Needle U-100 (BD INSULIN SYRINGE U/F) 31G X 5/16" 1 ML MISC Use for insulin injection up to four times daily for insulin dependent diabetes 200 each 4  . ipratropium-albuterol (DUONEB) 0.5-2.5 (3) MG/3ML SOLN INHALE 3 ML BY NEBULIZATION EVERY 4 HOURS AS NEEDED (Patient taking differently: Inhale 3 mLs into the lungs daily. ) 360 mL 8  . lactulose (CHRONULAC) 10 GM/15ML solution Take 30 mLs by mouth daily.    Marland Kitchen levothyroxine (SYNTHROID) 100 MCG tablet TAKE 1 TABLET DAILY (Patient taking differently: Take 100 mcg by mouth daily before breakfast. ) 90 tablet 2  . metoprolol tartrate (LOPRESSOR) 50 MG tablet Take 0.5 tablets (25 mg total) by mouth 2 (two) times daily.    . Misc. Devices (PULSE OXIMETER FOR FINGER) MISC 1 Device by Does  not apply route daily as needed. 1 each 0  . pantoprazole (PROTONIX) 40 MG tablet TAKE 1 TABLET TWICE A DAY (Patient taking differently: Take 40 mg by mouth daily. ) 180 tablet 3  . testosterone cypionate (DEPO-TESTOSTERONE) 200 MG/ML injection Inject 1 mL (200 mg total) into the muscle every 14 (fourteen) days. 6 mL 1  . torsemide (DEMADEX) 20 MG tablet Take 40 mg by mouth daily.    Marland Kitchen ULORIC 80 MG TABS Take 80 mg by mouth daily.      No current facility-administered medications for this visit.    Past Medical History:  Diagnosis Date  . Acute on chronic diastolic CHF (congestive heart failure) ([facility]) 06/21/2018  . Bell palsy 02/12/2015  . Carpal tunnel syndrome 02/12/2015  . Dependence on  nocturnal oxygen therapy    2 LITERS WITH BIPAP  . Depression   . Gastric ulcer   . GERD (gastroesophageal reflux disease)   . Gout   . History of chicken pox   . Multifocal pneumonia 03/04/2019  . Sleep apnea treated with nocturnal BiPAP     Past Surgical History:  Procedure Laterality Date  . CATARACT EXTRACTION Bilateral 2014 and 2015  . CHOLECYSTECTOMY  04/28/2011   Laproscopic; Dr. Pat Patrick  . DIALYSIS/PERMA CATHETER INSERTION N/A 05/24/2019   Procedure: DIALYSIS/PERMA CATHETER INSERTION;  Surgeon: Algernon Huxley, MD;  Location: [facility];  Service: Cardiovascular;  Laterality: N/A;  . DIALYSIS/PERMA CATHETER INSERTION N/A 10/09/2019   Procedure: DIALYSIS/PERMA CATHETER INSERTION;  Surgeon: Katha Cabal, MD;  Location: [facility];  Service: Cardiovascular;  Laterality: N/A;  . EYE SURGERY    . GALLBLADDER SURGERY    . LASIK Bilateral 2019   medical  . SHOULDER SURGERY Right 2012   Dr. Leanor Kail    Social History        Tobacco Use  . Smoking status: Former Smoker    Packs/day: 1.00    Years: 15.00    Pack years: 15.00    Types: Cigarettes    Quit date: 05/03/1978    Years since quitting: 41.1  . Smokeless tobacco: Former Systems developer  . Tobacco comment: started smoking at age 69  Substance Use Topics  . Alcohol use: No    Alcohol/week: 0.0 standard drinks  . Drug use: No  married        Family History  Problem Relation Age of Onset  . COPD Mother   . Heart disease Father   no bleeding or clotting disorders       Allergies  Allergen Reactions  . Guaifenesin Swelling    Throat swelling, increased heart rate.  . Mucinex  [Guaifenesin Er]     Throat swelling, increases heart rate  . Levaquin [Levofloxacin] Palpitations     REVIEW OF SYSTEMS (Negative unless checked)  Constitutional: [] ?Weight loss  [] ?Fever  [] ?Chills Cardiac: [] ?Chest pain   [] ?Chest pressure   [] ?Palpitations   [] ?Shortness of breath when  laying flat   [] ?Shortness of breath at rest   [x] ?Shortness of breath with exertion. Vascular:  [] ?Pain in legs with walking   [] ?Pain in legs at rest   [] ?Pain in legs when laying flat   [] ?Claudication   [] ?Pain in feet when walking  [] ?Pain in feet at rest  [] ?Pain in feet when laying flat   [] ?History of DVT   [] ?Phlebitis   [] ?Swelling in legs   [] ?Varicose veins   [] ?Non-healing ulcers Pulmonary:   [] ?Uses home oxygen   [] ?Productive cough   [] ?  Hemoptysis   [] ?Wheeze  [] ?COPD   [x] ?Asthma Neurologic:  [] ?Dizziness  [] ?Blackouts   [] ?Seizures   [] ?History of stroke   [] ?History of TIA  [] ?Aphasia   [] ?Temporary blindness   [] ?Dysphagia   [] ?Weakness or numbness in arms   [] ?Weakness or numbness in legs Musculoskeletal:  [x] ?Arthritis   [] ?Joint swelling   [] ?Joint pain   [] ?Low back pain Hematologic:  [] ?Easy bruising  [] ?Easy bleeding   [] ?Hypercoagulable state   [] ?Anemic   Gastrointestinal:  [] ?Blood in stool   [] ?Vomiting blood  [x] ?Gastroesophageal reflux/heartburn   [] ?Abdominal pain Genitourinary:  [x] ?Chronic kidney disease   [] ?Difficult urination  [] ?Frequent urination  [] ?Burning with urination   [] ?Hematuria Skin:  [] ?Rashes   [] ?Ulcers   [] ?Wounds Psychological:  [] ?History of anxiety   [] ? History of major depression.    Physical Examination  BP (!) 114/59 (BP Location: Right Arm)   Pulse 71   Resp 16   Wt (!) 312 lb (141.5 kg)   BMI 43.52 kg/m  Gen:  WD/WN, NAD Head: [place]/AT, No temporalis wasting. Ear/Nose/Throat: Hearing grossly intact, nares w/o erythema or drainage Eyes: Conjunctiva clear. Sclera non-icteric Neck: Supple.  Trachea midline Pulmonary:  Good air movement, no use of accessory muscles.  Cardiac: RRR, no JVD Vascular: Allens test normal bilaterally Vessel Right Left  Radial Palpable Palpable                                   Musculoskeletal: M/S 5/5 throughout.  No deformity or atrophy. Mild LE edema. Neurologic: Sensation grossly intact in  extremities.  Symmetrical.  Speech is fluent.  Psychiatric: Judgment intact, Mood & affect appropriate for pt's clinical situation. Dermatologic: No rashes or ulcers noted.  No cellulitis or open wounds.       Labs Recent Results (from the past 2160 hour(s))  SARS CORONAVIRUS 2 (TAT 6-24 HRS) Nasopharyngeal Nasopharyngeal Swab     Status: None   Collection Time: 08/21/19  8:40 AM   Specimen: Nasopharyngeal Swab  Result Value Ref Range   SARS Coronavirus 2 NEGATIVE NEGATIVE    Comment: (NOTE) SARS-CoV-2 target nucleic acids are NOT DETECTED. The SARS-CoV-2 RNA is generally detectable in upper and lower respiratory specimens during the acute phase of infection. Negative results do not preclude SARS-CoV-2 infection, do not rule out co-infections with other pathogens, and should not be used as the sole basis for treatment or other patient management decisions. Negative results must be combined with clinical observations, patient history, and epidemiological information. The expected result is Negative. Fact Sheet for Patients: SugarRoll.be Fact Sheet for Healthcare Providers: https://www.woods-mathews.com/ This test is not yet approved or cleared by the [place] FDA and  has been authorized for detection and/or diagnosis of SARS-CoV-2 by FDA under an Emergency Use Authorization (EUA). This EUA will remain  in effect (meaning this test can be used) for the duration of the COVID-19 declaration under Section 56 4(b)(1) of the Act, 21 U.S.C. section 360bbb-3(b)(1), unless the authorization is terminated or revoked sooner. Performed at [facility], [place] [address], [place], [place] [address]   SARS CORONAVIRUS 2 (TAT 6-24 HRS) Nasopharyngeal Nasopharyngeal Swab     Status: None   Collection Time: 10/08/19  1:06 PM   Specimen: Nasopharyngeal Swab  Result Value Ref Range   SARS Coronavirus 2 NEGATIVE NEGATIVE    Comment:  (NOTE) SARS-CoV-2 target nucleic acids are NOT DETECTED. The SARS-CoV-2 RNA is generally  detectable in upper and lower respiratory specimens during the acute phase of infection. Negative results do not preclude SARS-CoV-2 infection, do not rule out co-infections with other pathogens, and should not be used as the sole basis for treatment or other patient management decisions. Negative results must be combined with clinical observations, patient history, and epidemiological information. The expected result is Negative. Fact Sheet for Patients: SugarRoll.be Fact Sheet for Healthcare Providers: https://www.woods-mathews.com/ This test is not yet approved or cleared by the [place] FDA and  has been authorized for detection and/or diagnosis of SARS-CoV-2 by FDA under an Emergency Use Authorization (EUA). This EUA will remain  in effect (meaning this test can be used) for the duration of the COVID-19 declaration under Section 56 4(b)(1) of the Act, 21 U.S.C. section 360bbb-3(b)(1), unless the authorization is terminated or revoked sooner. Performed at [facility], [place] [address], [place], [place] [address]   Potassium [facility] vascular lab only)     Status: None   Collection Time: 10/09/19  2:27 PM  Result Value Ref Range   Potassium [facility] vascular lab) 4.2 3.5 - 5.1    Comment: Performed at [facility], [address] [place], [place] [address]  Glucose, capillary     Status: Abnormal   Collection Time: 10/09/19  2:44 PM  Result Value Ref Range   Glucose-Capillary 172 (H) 70 - 99 mg/dL    Comment: Glucose reference range applies only to samples taken after fasting for at least 8 hours.  Glucose, capillary     Status: Abnormal   Collection Time: 10/09/19  5:15 PM  Result Value Ref Range   Glucose-Capillary 171 (H) 70 - 99 mg/dL    Comment: Glucose reference range applies only to samples taken after fasting for at least 8  hours.    Radiology PERIPHERAL VASCULAR CATHETERIZATION  Result Date: 10/09/2019 See Op Note   Assessment/Plan Hypertension blood pressure control important in reducing the progression of atherosclerotic disease. On appropriate oral medications.   Diabetes mellitus with neuropathy (HCC) blood glucose control important in reducing the progression of atherosclerotic disease. Also, involved in wound healing. On appropriate medications.  HLD (hyperlipidemia) lipid control important in reducing the progression of atherosclerotic disease. Continue statin therapy  ESRD on dialysis [facility]) The patient has a difficult situation and that his peritoneal dialysis is working, but not well enough to clear his fluid load.  He is going to need some sort of combination of intermittent hemodialysis with his peritoneal dialysis.   His arterial studies today were normal in both upper extremities and his vein mapping showed large patent cephalic veins throughout both upper extremities that would be usable for fistula creation.  Given that he is right-hand dominant, I will plan to place a left radiocephalic AV fistula for his dialysis access for hemodialysis.  Risks and benefits are discussed.  He is agreeable to proceed.    Leotis Pain, MD  11/02/2019 12:06 PM    This note was created with Dragon medical transcription system.  Any errors from dictation are purely unintentional

## 2019-11-02 NOTE — Assessment & Plan Note (Signed)
The patient has a difficult situation and that his peritoneal dialysis is working, but not well enough to clear his fluid load.  He is going to need some sort of combination of intermittent hemodialysis with his peritoneal dialysis.   His arterial studies today were normal in both upper extremities and his vein mapping showed large patent cephalic veins throughout both upper extremities that would be usable for fistula creation.  Given that he is right-hand dominant, I will plan to place a left radiocephalic AV fistula for his dialysis access for hemodialysis.  Risks and benefits are discussed.  He is agreeable to proceed.

## 2019-11-02 NOTE — Telephone Encounter (Signed)
Janett Billow with [facility]  is calling regarding ventolin hfa inhaler the pt plan does not cover however they will cover albuterol proair hfa inhaler reference number [id number]. Please respond by 530 pm today so that med can be shipped out

## 2019-11-02 NOTE — Progress Notes (Signed)
Established patient visit   Patient: Patrick Hurst   DOB: March 04, 1956   64 y.o. Male  MRN: [id number] Visit Date: 11/12/2019  Today's healthcare provider: Lelon Huh, MD   Chief Complaint  Patient presents with  . Diabetes  . Hypertension  . Hypothyroidism   Subjective    HPI  Diabetes Mellitus Type II, Follow-up  Lab Results  Component Value Date   HGBA1C 5.6 07/11/2019   HGBA1C 6.1 (H) 03/02/2019   HGBA1C 5.6 02/20/2018   Wt Readings from Last 3 Encounters:  11/12/19 (!) 309 lb 6.4 oz (140.3 kg)  11/08/19 (!) 301 lb (136.5 kg)  11/02/19 (!) 312 lb (141.5 kg)   Last seen for diabetes 3 months ago.  Management since then includes continuing same medications for now. He reports excellent compliance with treatment. He is not having side effects.  Symptoms: Yes fatigue No foot ulcerations  Yes appetite changes No nausea  Yes paresthesia of the feet  Yes polydipsia  Yes polyuria No visual disturbances   No vomiting     Home blood sugar records: 100-200  Episodes of hypoglycemia? No    Current insulin regiment: Humulin R 500 60 units twice a day Most Recent Eye Exam: 08/02/2019 Current exercise: walking Current diet habits: well balanced  Pertinent Labs: Lab Results  Component Value Date   CHOL 101 02/20/2018   HDL 24 (L) 02/20/2018   LDLCALC 35 02/20/2018   TRIG 208 (H) 02/20/2018   CHOLHDL 4.2 02/20/2018   Lab Results  Component Value Date   NA 139 11/08/2019   K 5.5 (H) 11/08/2019   CREATININE 12.80 (H) 11/08/2019   GFRNONAA 4 (L) 11/08/2019   GFRAA 5 (L) 11/08/2019   GLUCOSE 188 (H) 11/08/2019     ---------------------------------------------------------------------------------------------------  Hypothyroid, follow-up  Lab Results  Component Value Date   TSH 2.518 03/04/2019   TSH 5.340 (H) 02/20/2018   TSH 3.880 11/18/2017   Wt Readings from Last 3 Encounters:  11/12/19 (!) 309 lb 6.4 oz (140.3 kg)  11/08/19 (!) 301 lb (136.5  kg)  11/02/19 (!) 312 lb (141.5 kg)    He was last seen for hypothyroid 3 months ago.  Management since that visit includes continue same medication. He reports excellent compliance with treatment. He is not having side effects.   Symptoms: Yes change in energy level No constipation  No diarrhea No heat / cold intolerance  No nervousness No palpitations  No weight changes    -----------------------------------------------------------------------------------------  Hypertension, follow-up  BP Readings from Last 3 Encounters:  11/12/19 140/64  11/08/19 126/63  11/02/19 (!) 114/59   Wt Readings from Last 3 Encounters:  11/12/19 (!) 309 lb 6.4 oz (140.3 kg)  11/08/19 (!) 301 lb (136.5 kg)  11/02/19 (!) 312 lb (141.5 kg)     He was last seen for hypertension 3 months ago.  BP at that visit was 168/72. Management since that visit includes continue same medications.  He reports excellent compliance with treatment. He is not having side effects.  He is following a Regular diet. He is exercising. He does not smoke.  Use of agents associated with hypertension: thyroid hormones.   Outside blood pressures are systolic 053Z, diastolic 76-73. Symptoms: No chest pain No chest pressure  No palpitations No syncope  Yes dyspnea No orthopnea  Yes paroxysmal nocturnal dyspnea Yes lower extremity edema   Pertinent labs: Lab Results  Component Value Date   CHOL 101 02/20/2018   HDL 24 (L) 02/20/2018  LDLCALC 35 02/20/2018   TRIG 208 (H) 02/20/2018   CHOLHDL 4.2 02/20/2018   Lab Results  Component Value Date   NA 139 11/08/2019   K 5.5 (H) 11/08/2019   CREATININE 12.80 (H) 11/08/2019   GFRNONAA 4 (L) 11/08/2019   GFRAA 5 (L) 11/08/2019   GLUCOSE 188 (H) 11/08/2019     The ASCVD Risk score Mikey Bussing DC Jr., et al., 2013) failed to calculate for the following reasons:   The patient has a prior MI or stroke diagnosis    ---------------------------------------------------------------------------------------------------  He also states he has been having low oxygen levels in the mornings sometimes down to 70. He does wear oxygen at night. Has been more short of breath with exertion lately.      Medications: Outpatient Medications Prior to Visit  Medication Sig  . albuterol (PROAIR HFA) 108 (90 Base) MCG/ACT inhaler Inhale 2 puffs into the lungs every 6 (six) hours as needed for wheezing or shortness of breath.  Marland Kitchen amiodarone (PACERONE) 200 MG tablet Take 200 mg by mouth daily.  Marland Kitchen amitriptyline (ELAVIL) 25 MG tablet TAKE 1 TABLET AT BEDTIME (Patient taking differently: Take 25 mg by mouth at bedtime. )  . amLODipine (NORVASC) 5 MG tablet Take 1 tablet (5 mg total) by mouth daily.  . Ascorbic Acid (VITAMIN C) 1000 MG tablet Take 1,000 mg by mouth 2 (two) times daily.   Marland Kitchen atorvastatin (LIPITOR) 40 MG tablet TAKE 1 TABLET DAILY  . calcitRIOL (ROCALTROL) 0.5 MCG capsule Take 0.5 mcg by mouth daily.  . cetirizine (ZYRTEC) 10 MG tablet Take 10 mg by mouth daily.   Marland Kitchen ELIQUIS 2.5 MG TABS tablet Take 2.5 mg by mouth 2 (two) times daily.   . fluticasone furoate-vilanterol (BREO ELLIPTA) 100-25 MCG/INH AEPB Inhale 1 puff into the lungs daily.  Marland Kitchen gentamicin cream (GARAMYCIN) 0.1 % Apply 1 application topically daily.  . hydrALAZINE (APRESOLINE) 25 MG tablet TAKE 1 TABLET THREE TIMES A DAY (Patient taking differently: Take 25 mg by mouth 3 (three) times daily. )  . insulin regular human CONCENTRATED (HUMULIN R) 500 UNIT/ML injection Inject up to 25 units twice a day, or as directed by physician (Patient taking differently: Inject 50 Units into the skin 2 (two) times daily with a meal. or as directed by physician)  . Insulin Syringe-Needle U-100 (BD INSULIN SYRINGE U/F) 31G X 5/16" 1 ML MISC Use for insulin injection up to four times daily for insulin dependent diabetes  . ipratropium-albuterol (DUONEB) 0.5-2.5 (3) MG/3ML  SOLN INHALE 3 ML BY NEBULIZATION EVERY 4 HOURS AS NEEDED (Patient taking differently: Inhale 3 mLs into the lungs daily. )  . lactulose (CHRONULAC) 10 GM/15ML solution Take 30 mLs by mouth daily.  Marland Kitchen levothyroxine (SYNTHROID) 100 MCG tablet TAKE 1 TABLET DAILY (Patient taking differently: Take 100 mcg by mouth daily before breakfast. )  . metoprolol tartrate (LOPRESSOR) 50 MG tablet Take 0.5 tablets (25 mg total) by mouth 2 (two) times daily.  . Misc. Devices (PULSE OXIMETER FOR FINGER) MISC 1 Device by Does not apply route daily as needed.  . pantoprazole (PROTONIX) 40 MG tablet TAKE 1 TABLET TWICE A DAY (Patient taking differently: Take 40 mg by mouth daily. )  . testosterone cypionate (DEPO-TESTOSTERONE) 200 MG/ML injection Inject 1 mL (200 mg total) into the muscle every 14 (fourteen) days.  Marland Kitchen torsemide (DEMADEX) 20 MG tablet Take 40 mg by mouth daily.  Marland Kitchen ULORIC 80 MG TABS Take 80 mg by mouth daily.    No  facility-administered medications prior to visit.    Review of Systems  Constitutional: Positive for fatigue. Negative for appetite change, chills and fever.  Respiratory: Positive for shortness of breath. Negative for chest tightness and wheezing.   Cardiovascular: Negative for chest pain and palpitations.  Gastrointestinal: Negative for abdominal pain, nausea and vomiting.      Objective    BP 140/64   Pulse 77   Temp (!) 97.5 F (36.4 C) (Oral)   Resp 18   Wt (!) 309 lb 6.4 oz (140.3 kg)   SpO2 90%   BMI 43.15 kg/m    Physical Exam   General: Appearance:    Severely obese male in no acute distress  Eyes:    PERRL, conjunctiva/corneas clear, EOM's intact       Lungs:     Clear to auscultation bilaterally, respirations unlabored  Heart:    Normal heart rate. Normal rhythm. III/VI systolic murmur RUSB. No rubs, or gallops.   MS:   All extremities are intact.   Neurologic:   Awake, alert, oriented x 3. No apparent focal neurological           defect.         Results  for orders placed or performed in visit on 11/12/19  POCT HgB A1C  Result Value Ref Range   Hemoglobin A1C 5.8 (A) 4.0 - 5.6 %   HbA1c POC (<> result, manual entry)     HbA1c, POC (prediabetic range)     HbA1c, POC (controlled diabetic range)      Assessment & Plan     1. Type 2 diabetes mellitus with diabetic neuropathy, with long-term current use of insulin (HCC) Very well controlled. He states he is taking 60 units humalog twice a day. Advised he should reduced each dose by 5-10 units due to hypoglycemia risk.  - DG Chest 2 View; Future  2. Shortness of breath Multifactorial. Has HFpEF, anemia, cirrhosis, COPD and end stage renal failure.  -CXT  3. Secondary hyperparathyroidism of renal origin ([place])   4. Other cirrhosis of liver ([place]) stable  5. Systolic murmur He did have echocardiogram in November, but if signs of worse HF on XR may need to repeat echo.   6. Chronic diastolic CHF (congestive heart failure) ([place])   7. Obstructive sleep apnea On PAP  8. ESRD on peritoneal dialysis ([place])   9. Dependence on nocturnal oxygen therapy        The entirety of the information documented in the History of Present Illness, Review of Systems and Physical Exam were personally obtained by me. Portions of this information were initially documented by the CMA and reviewed by me for thoroughness and accuracy.      Lelon Huh, MD  [facility] [phone] (phone) [phone] (fax)  [place]

## 2019-11-07 ENCOUNTER — Other Ambulatory Visit: Payer: Self-pay

## 2019-11-07 ENCOUNTER — Other Ambulatory Visit (INDEPENDENT_AMBULATORY_CARE_PROVIDER_SITE_OTHER): Payer: Self-pay | Admitting: Nurse Practitioner

## 2019-11-07 ENCOUNTER — Telehealth (INDEPENDENT_AMBULATORY_CARE_PROVIDER_SITE_OTHER): Payer: Self-pay

## 2019-11-07 ENCOUNTER — Other Ambulatory Visit
Admission: RE | Admit: 2019-11-07 | Discharge: 2019-11-07 | Disposition: A | Discharge status: Home / Self Care | Payer: Commercial Managed Care - PPO | Source: Ambulatory Visit | Attending: Vascular Surgery | Admitting: Vascular Surgery

## 2019-11-07 DIAGNOSIS — Z20822 Contact with and (suspected) exposure to covid-19: Secondary | ICD-10-CM | POA: Diagnosis not present

## 2019-11-07 DIAGNOSIS — Z01812 Encounter for preprocedural laboratory examination: Secondary | ICD-10-CM | POA: Insufficient documentation

## 2019-11-07 LAB — SARS CORONAVIRUS 2 (TAT 6-24 HRS): SARS Coronavirus 2: NEGATIVE

## 2019-11-07 NOTE — Progress Notes (Signed)
Orders for the patient's procedure, scheduled for 11/08/19, have not been recieved as of 1425 on 11/07/19. Called Dr Bunnie Domino office and left a message on the nurse line requesting procedure orders for 11/08/19.

## 2019-11-07 NOTE — Telephone Encounter (Signed)
Spoke with the patient and he is scheduled with Dr. Lucky Cowboy for a left AVF on 11/08/19 at the [facility]. Patient had his covid test today and was told to be at the [facility] same day surgery at 10:30 on 11/08/19. Patient was told to stop his Eliquis today and tomorrow for his surgery.

## 2019-11-08 ENCOUNTER — Ambulatory Visit
Admission: RE | Admit: 2019-11-08 | Discharge: 2019-11-08 | Disposition: A | Discharge status: Home / Self Care | Payer: Commercial Managed Care - PPO | Attending: Vascular Surgery | Admitting: Vascular Surgery

## 2019-11-08 ENCOUNTER — Encounter
Admission: RE | Disposition: A | Discharge status: Home / Self Care | Payer: Self-pay | Source: Home / Self Care | Attending: Vascular Surgery

## 2019-11-08 ENCOUNTER — Ambulatory Visit: Payer: Commercial Managed Care - PPO | Admitting: Anesthesiology

## 2019-11-08 ENCOUNTER — Encounter: Payer: Self-pay | Admitting: Vascular Surgery

## 2019-11-08 ENCOUNTER — Other Ambulatory Visit: Payer: Self-pay

## 2019-11-08 DIAGNOSIS — Z794 Long term (current) use of insulin: Secondary | ICD-10-CM | POA: Insufficient documentation

## 2019-11-08 DIAGNOSIS — Z9981 Dependence on supplemental oxygen: Secondary | ICD-10-CM | POA: Diagnosis not present

## 2019-11-08 DIAGNOSIS — I5033 Acute on chronic diastolic (congestive) heart failure: Secondary | ICD-10-CM | POA: Diagnosis not present

## 2019-11-08 DIAGNOSIS — N186 End stage renal disease: Secondary | ICD-10-CM | POA: Insufficient documentation

## 2019-11-08 DIAGNOSIS — Z6841 Body Mass Index (BMI) 40.0 and over, adult: Secondary | ICD-10-CM | POA: Diagnosis not present

## 2019-11-08 DIAGNOSIS — N185 Chronic kidney disease, stage 5: Secondary | ICD-10-CM

## 2019-11-08 DIAGNOSIS — E1122 Type 2 diabetes mellitus with diabetic chronic kidney disease: Secondary | ICD-10-CM | POA: Diagnosis not present

## 2019-11-08 DIAGNOSIS — Z01810 Encounter for preprocedural cardiovascular examination: Secondary | ICD-10-CM

## 2019-11-08 DIAGNOSIS — Z888 Allergy status to other drugs, medicaments and biological substances status: Secondary | ICD-10-CM | POA: Insufficient documentation

## 2019-11-08 DIAGNOSIS — Z79899 Other long term (current) drug therapy: Secondary | ICD-10-CM | POA: Insufficient documentation

## 2019-11-08 DIAGNOSIS — Z881 Allergy status to other antibiotic agents status: Secondary | ICD-10-CM | POA: Diagnosis not present

## 2019-11-08 DIAGNOSIS — I132 Hypertensive heart and chronic kidney disease with heart failure and with stage 5 chronic kidney disease, or end stage renal disease: Secondary | ICD-10-CM | POA: Diagnosis not present

## 2019-11-08 DIAGNOSIS — M1990 Unspecified osteoarthritis, unspecified site: Secondary | ICD-10-CM | POA: Insufficient documentation

## 2019-11-08 DIAGNOSIS — G709 Myoneural disorder, unspecified: Secondary | ICD-10-CM | POA: Diagnosis not present

## 2019-11-08 DIAGNOSIS — Z87891 Personal history of nicotine dependence: Secondary | ICD-10-CM | POA: Insufficient documentation

## 2019-11-08 DIAGNOSIS — Z7901 Long term (current) use of anticoagulants: Secondary | ICD-10-CM | POA: Diagnosis not present

## 2019-11-08 DIAGNOSIS — G4730 Sleep apnea, unspecified: Secondary | ICD-10-CM | POA: Insufficient documentation

## 2019-11-08 DIAGNOSIS — J449 Chronic obstructive pulmonary disease, unspecified: Secondary | ICD-10-CM | POA: Insufficient documentation

## 2019-11-08 DIAGNOSIS — Z8711 Personal history of peptic ulcer disease: Secondary | ICD-10-CM | POA: Insufficient documentation

## 2019-11-08 DIAGNOSIS — M109 Gout, unspecified: Secondary | ICD-10-CM | POA: Diagnosis not present

## 2019-11-08 DIAGNOSIS — Z992 Dependence on renal dialysis: Secondary | ICD-10-CM | POA: Diagnosis not present

## 2019-11-08 DIAGNOSIS — Z7951 Long term (current) use of inhaled steroids: Secondary | ICD-10-CM | POA: Diagnosis not present

## 2019-11-08 DIAGNOSIS — K219 Gastro-esophageal reflux disease without esophagitis: Secondary | ICD-10-CM | POA: Insufficient documentation

## 2019-11-08 DIAGNOSIS — E039 Hypothyroidism, unspecified: Secondary | ICD-10-CM | POA: Insufficient documentation

## 2019-11-08 HISTORY — PX: AV FISTULA PLACEMENT: SHX1204

## 2019-11-08 LAB — CBC WITH DIFFERENTIAL/PLATELET
Abs Immature Granulocytes: 0.08 10*3/uL — ABNORMAL HIGH (ref 0.00–0.07)
Basophils Absolute: 0.1 10*3/uL (ref 0.0–0.1)
Basophils Relative: 1 %
Eosinophils Absolute: 0.3 10*3/uL (ref 0.0–0.5)
Eosinophils Relative: 4 %
HCT: 26.3 % — ABNORMAL LOW (ref 39.0–52.0)
Hemoglobin: 9.2 g/dL — ABNORMAL LOW (ref 13.0–17.0)
Immature Granulocytes: 1 %
Lymphocytes Relative: 13 %
Lymphs Abs: 1 10*3/uL (ref 0.7–4.0)
MCH: 31.5 pg (ref 26.0–34.0)
MCHC: 35 g/dL (ref 30.0–36.0)
MCV: 90.1 fL (ref 80.0–100.0)
Monocytes Absolute: 0.8 10*3/uL (ref 0.1–1.0)
Monocytes Relative: 10 %
Neutro Abs: 5.4 10*3/uL (ref 1.7–7.7)
Neutrophils Relative %: 71 %
Platelets: 115 10*3/uL — ABNORMAL LOW (ref 150–400)
RBC: 2.92 MIL/uL — ABNORMAL LOW (ref 4.22–5.81)
RDW: 15 % (ref 11.5–15.5)
WBC: 7.6 10*3/uL (ref 4.0–10.5)
nRBC: 0 % (ref 0.0–0.2)

## 2019-11-08 LAB — POCT I-STAT, CHEM 8
BUN: 123 mg/dL — ABNORMAL HIGH (ref 8–23)
Calcium, Ion: 0.97 mmol/L — ABNORMAL LOW (ref 1.15–1.40)
Chloride: 101 mmol/L (ref 98–111)
Creatinine, Ser: 12.8 mg/dL — ABNORMAL HIGH (ref 0.61–1.24)
Glucose, Bld: 188 mg/dL — ABNORMAL HIGH (ref 70–99)
HCT: 26 % — ABNORMAL LOW (ref 39.0–52.0)
Hemoglobin: 8.8 g/dL — ABNORMAL LOW (ref 13.0–17.0)
Potassium: 5.5 mmol/L — ABNORMAL HIGH (ref 3.5–5.1)
Sodium: 139 mmol/L (ref 135–145)
TCO2: 22 mmol/L (ref 22–32)

## 2019-11-08 LAB — BASIC METABOLIC PANEL
Anion gap: 20 — ABNORMAL HIGH (ref 5–15)
BUN: 100 mg/dL — ABNORMAL HIGH (ref 8–23)
CO2: 21 mmol/L — ABNORMAL LOW (ref 22–32)
Calcium: 8.2 mg/dL — ABNORMAL LOW (ref 8.9–10.3)
Chloride: 99 mmol/L (ref 98–111)
Creatinine, Ser: 11.84 mg/dL — ABNORMAL HIGH (ref 0.61–1.24)
GFR calc Af Amer: 5 mL/min — ABNORMAL LOW (ref >60)
GFR calc non Af Amer: 4 mL/min — ABNORMAL LOW (ref >60)
Glucose, Bld: 188 mg/dL — ABNORMAL HIGH (ref 70–99)
Potassium: 5.5 mmol/L — ABNORMAL HIGH (ref 3.5–5.1)
Sodium: 140 mmol/L (ref 135–145)

## 2019-11-08 LAB — PROTIME-INR
INR: 1.1 (ref 0.8–1.2)
Prothrombin Time: 14.2 seconds (ref 11.4–15.2)

## 2019-11-08 LAB — APTT: aPTT: 34 seconds (ref 24–36)

## 2019-11-08 LAB — TYPE AND SCREEN
ABO/RH(D): A POS
Antibody Screen: NEGATIVE

## 2019-11-08 LAB — GLUCOSE, CAPILLARY: Glucose-Capillary: 193 mg/dL — ABNORMAL HIGH (ref 70–99)

## 2019-11-08 SURGERY — ARTERIOVENOUS (AV) FISTULA CREATION
Anesthesia: General | Laterality: Left

## 2019-11-08 MED ORDER — SODIUM CHLORIDE 0.9 % IV SOLN: INTRAVENOUS | Status: DC | PRN

## 2019-11-08 MED ORDER — HEPARIN SODIUM (PORCINE) 5000 UNIT/ML IJ SOLN
INTRAMUSCULAR | Status: AC
  Filled 2019-11-08: qty 1

## 2019-11-08 MED ORDER — LIDOCAINE HCL (CARDIAC) PF 100 MG/5ML IV SOSY
PREFILLED_SYRINGE | INTRAVENOUS | Status: DC | PRN
  Administered 2019-11-08: 100 mg via INTRAVENOUS

## 2019-11-08 MED ORDER — BUPIVACAINE-EPINEPHRINE (PF) 0.5% -1:200000 IJ SOLN
INTRAMUSCULAR | Status: DC | PRN
  Administered 2019-11-08: 10 mL

## 2019-11-08 MED ORDER — ONDANSETRON HCL 4 MG/2ML IJ SOLN
INTRAMUSCULAR | Status: AC
  Filled 2019-11-08: qty 2

## 2019-11-08 MED ORDER — PHENYLEPHRINE HCL-NACL 10-0.9 MG/250ML-% IV SOLN
INTRAVENOUS | Status: DC | PRN
  Administered 2019-11-08: 20 ug/min via INTRAVENOUS

## 2019-11-08 MED ORDER — BUPIVACAINE-EPINEPHRINE (PF) 0.5% -1:200000 IJ SOLN
INTRAMUSCULAR | Status: AC
  Filled 2019-11-08: qty 30

## 2019-11-08 MED ORDER — CHLORHEXIDINE GLUCONATE CLOTH 2 % EX PADS
6.0000 | MEDICATED_PAD | Freq: Once | CUTANEOUS | Status: AC
  Administered 2019-11-08: 6 via TOPICAL

## 2019-11-08 MED ORDER — LIDOCAINE HCL (PF) 2 % IJ SOLN
INTRAMUSCULAR | Status: AC
  Filled 2019-11-08: qty 5

## 2019-11-08 MED ORDER — HYDROCODONE-ACETAMINOPHEN 5-325 MG PO TABS: 1.0000 | ORAL_TABLET | Freq: Once | ORAL | Status: DC

## 2019-11-08 MED ORDER — ORAL CARE MOUTH RINSE: 15.0000 mL | Freq: Once | OROMUCOSAL | Status: AC

## 2019-11-08 MED ORDER — PROMETHAZINE HCL 25 MG/ML IJ SOLN: 6.2500 mg | INTRAMUSCULAR | Status: DC | PRN

## 2019-11-08 MED ORDER — HYDROCODONE-ACETAMINOPHEN 7.5-325 MG PO TABS
1.0000 | ORAL_TABLET | Freq: Once | ORAL | Status: AC | PRN
  Administered 2019-11-08: 1 via ORAL
  Filled 2019-11-08: qty 1

## 2019-11-08 MED ORDER — HEMOSTATIC AGENTS (NO CHARGE) OPTIME
TOPICAL | Status: DC | PRN
  Administered 2019-11-08: 1 via TOPICAL

## 2019-11-08 MED ORDER — CHLORHEXIDINE GLUCONATE 0.12 % MT SOLN
15.0000 mL | Freq: Once | OROMUCOSAL | Status: AC
  Administered 2019-11-08: 15 mL via OROMUCOSAL

## 2019-11-08 MED ORDER — ONDANSETRON HCL 4 MG/2ML IJ SOLN
INTRAMUSCULAR | Status: DC | PRN
  Administered 2019-11-08: 4 mg via INTRAVENOUS

## 2019-11-08 MED ORDER — EPHEDRINE SULFATE 50 MG/ML IJ SOLN
INTRAMUSCULAR | Status: DC | PRN
  Administered 2019-11-08: 5 mg via INTRAVENOUS
  Administered 2019-11-08: 10 mg via INTRAVENOUS

## 2019-11-08 MED ORDER — CHLORHEXIDINE GLUCONATE 0.12 % MT SOLN
OROMUCOSAL | Status: AC
  Filled 2019-11-08: qty 15

## 2019-11-08 MED ORDER — HYDROCODONE-ACETAMINOPHEN 7.5-325 MG PO TABS
1.0000 | ORAL_TABLET | Freq: Once | ORAL | Status: AC
  Filled 2019-11-08: qty 1

## 2019-11-08 MED ORDER — ACETAMINOPHEN 325 MG PO TABS: 325.0000 mg | ORAL_TABLET | ORAL | Status: DC | PRN

## 2019-11-08 MED ORDER — PROPOFOL 10 MG/ML IV BOLUS
INTRAVENOUS | Status: AC
  Filled 2019-11-08: qty 20

## 2019-11-08 MED ORDER — FENTANYL CITRATE (PF) 100 MCG/2ML IJ SOLN
25.0000 ug | INTRAMUSCULAR | Status: DC | PRN
  Administered 2019-11-08 (×2): 25 ug via INTRAVENOUS

## 2019-11-08 MED ORDER — HYDROCODONE-ACETAMINOPHEN 7.5-325 MG PO TABS
ORAL_TABLET | ORAL | Status: AC
  Filled 2019-11-08: qty 1

## 2019-11-08 MED ORDER — CEFAZOLIN SODIUM-DEXTROSE 1-4 GM/50ML-% IV SOLN
1.0000 g | INTRAVENOUS | Status: AC
  Administered 2019-11-08: 1 g via INTRAVENOUS

## 2019-11-08 MED ORDER — FENTANYL CITRATE (PF) 100 MCG/2ML IJ SOLN
INTRAMUSCULAR | Status: AC
  Filled 2019-11-08: qty 2

## 2019-11-08 MED ORDER — SODIUM CHLORIDE 0.9 % IV SOLN: INTRAVENOUS | Status: DC

## 2019-11-08 MED ORDER — EPHEDRINE 5 MG/ML INJ
INTRAVENOUS | Status: AC
  Filled 2019-11-08: qty 10

## 2019-11-08 MED ORDER — HYDROCODONE-ACETAMINOPHEN 7.5-325 MG PO TABS
ORAL_TABLET | ORAL | Status: AC
  Administered 2019-11-08: 1 via ORAL
  Filled 2019-11-08: qty 1

## 2019-11-08 MED ORDER — ACETAMINOPHEN 160 MG/5ML PO SOLN
325.0000 mg | ORAL | Status: DC | PRN
  Filled 2019-11-08: qty 20.3

## 2019-11-08 MED ORDER — LACTATED RINGERS IV SOLN: INTRAVENOUS | Status: DC | PRN

## 2019-11-08 MED ORDER — CEFAZOLIN SODIUM-DEXTROSE 1-4 GM/50ML-% IV SOLN
INTRAVENOUS | Status: AC
  Filled 2019-11-08: qty 50

## 2019-11-08 MED ORDER — FENTANYL CITRATE (PF) 100 MCG/2ML IJ SOLN
INTRAMUSCULAR | Status: DC | PRN
  Administered 2019-11-08: 50 ug via INTRAVENOUS
  Administered 2019-11-08 (×4): 25 ug via INTRAVENOUS

## 2019-11-08 MED ORDER — PROPOFOL 10 MG/ML IV BOLUS
INTRAVENOUS | Status: DC | PRN
  Administered 2019-11-08: 120 mg via INTRAVENOUS
  Administered 2019-11-08: 80 mg via INTRAVENOUS

## 2019-11-08 MED ORDER — PAPAVERINE HCL 30 MG/ML IJ SOLN
INTRAMUSCULAR | Status: AC
  Filled 2019-11-08: qty 2

## 2019-11-08 SURGICAL SUPPLY — 56 items
ADH SKN CLS APL DERMABOND .7 (GAUZE/BANDAGES/DRESSINGS) ×1
APL PRP STRL LF DISP 70% ISPRP (MISCELLANEOUS) ×1
BAG DECANTER FOR FLEXI CONT (MISCELLANEOUS) ×2 IMPLANT
BLADE SURG SZ11 CARB STEEL (BLADE) ×2 IMPLANT
BOOT SUTURE AID YELLOW STND (SUTURE) ×2 IMPLANT
BRUSH SCRUB EZ  4% CHG (MISCELLANEOUS) ×2
BRUSH SCRUB EZ 4% CHG (MISCELLANEOUS) ×1 IMPLANT
CANISTER SUCT 1200ML W/VALVE (MISCELLANEOUS) ×2 IMPLANT
CHLORAPREP W/TINT 26 (MISCELLANEOUS) ×2 IMPLANT
CLIP SPRNG 6 S-JAW DBL (CLIP) ×1 IMPLANT
CLIP SPRNG 6MM S-JAW DBL (CLIP) ×2
COVER WAND RF STERILE (DRAPES) ×2 IMPLANT
DERMABOND ADVANCED (GAUZE/BANDAGES/DRESSINGS) ×1
DERMABOND ADVANCED .7 DNX12 (GAUZE/BANDAGES/DRESSINGS) ×1 IMPLANT
ELECT CAUTERY BLADE 6.4 (BLADE) ×2 IMPLANT
ELECT REM PT RETURN 9FT ADLT (ELECTROSURGICAL) ×2
ELECTRODE REM PT RTRN 9FT ADLT (ELECTROSURGICAL) ×1 IMPLANT
GEL ULTRASOUND 20GR AQUASONIC (MISCELLANEOUS) IMPLANT
GLOVE BIO SURGEON STRL SZ7 (GLOVE) ×4 IMPLANT
GLOVE INDICATOR 7.5 STRL GRN (GLOVE) ×2 IMPLANT
GOWN STRL REUS W/ TWL LRG LVL3 (GOWN DISPOSABLE) ×2 IMPLANT
GOWN STRL REUS W/ TWL XL LVL3 (GOWN DISPOSABLE) ×1 IMPLANT
GOWN STRL REUS W/TWL LRG LVL3 (GOWN DISPOSABLE) ×4
GOWN STRL REUS W/TWL XL LVL3 (GOWN DISPOSABLE) ×2
HEMOSTAT SURGICEL 2X3 (HEMOSTASIS) ×2 IMPLANT
IV NS 500ML (IV SOLUTION) ×2
IV NS 500ML BAXH (IV SOLUTION) ×1 IMPLANT
KIT TURNOVER KIT A (KITS) ×2 IMPLANT
LABEL OR SOLS (LABEL) ×2 IMPLANT
LOOP RED MAXI  1X406MM (MISCELLANEOUS) ×1
LOOP VESSEL MAXI  1X406 RED (MISCELLANEOUS) ×1
LOOP VESSEL MAXI 1X406 RED (MISCELLANEOUS) ×1 IMPLANT
LOOP VESSEL MINI 0.8X406 BLUE (MISCELLANEOUS) ×1 IMPLANT
LOOPS BLUE MINI 0.8X406MM (MISCELLANEOUS) ×1
NDL FILTER BLUNT 18X1 1/2 (NEEDLE) ×1 IMPLANT
NEEDLE FILTER BLUNT 18X 1/2SAF (NEEDLE) ×1
NEEDLE FILTER BLUNT 18X1 1/2 (NEEDLE) ×1 IMPLANT
NS IRRIG 500ML POUR BTL (IV SOLUTION) ×2 IMPLANT
PACK EXTREMITY (MISCELLANEOUS) ×2 IMPLANT
PAD PREP 24X41 OB/GYN DISP (PERSONAL CARE ITEMS) ×2 IMPLANT
SOLUTION CELL SAVER (CLIP) ×1 IMPLANT
STOCKINETTE 48X4 2 PLY STRL (GAUZE/BANDAGES/DRESSINGS) ×1 IMPLANT
STOCKINETTE STRL 4IN 9604848 (GAUZE/BANDAGES/DRESSINGS) ×2 IMPLANT
SUT MNCRL AB 4-0 PS2 18 (SUTURE) ×2 IMPLANT
SUT PROLENE 6 0 BV (SUTURE) ×8 IMPLANT
SUT SILK 2 0 (SUTURE) ×2
SUT SILK 2-0 18XBRD TIE 12 (SUTURE) ×1 IMPLANT
SUT SILK 3 0 (SUTURE) ×2
SUT SILK 3-0 18XBRD TIE 12 (SUTURE) ×1 IMPLANT
SUT SILK 4 0 (SUTURE) ×2
SUT SILK 4-0 18XBRD TIE 12 (SUTURE) ×1 IMPLANT
SUT VIC AB 3-0 SH 27 (SUTURE) ×2
SUT VIC AB 3-0 SH 27X BRD (SUTURE) ×1 IMPLANT
SYR 20ML LL LF (SYRINGE) ×2 IMPLANT
SYR 3ML LL SCALE MARK (SYRINGE) ×2 IMPLANT
SYR TB 1ML 27GX1/2 LL (SYRINGE) IMPLANT

## 2019-11-08 NOTE — Discharge Instructions (Addendum)
You had pain pill (Norco) at hospital at 2:59 pm and one at 4:17 pm.    AMBULATORY SURGERY  DISCHARGE INSTRUCTIONS   1) The drugs that you were given will stay in your system until tomorrow so for the next 24 hours you should not:  A) Drive an automobile B) Make any legal decisions C) Drink any alcoholic beverage   2) You may resume regular meals tomorrow.  Today it is better to start with liquids and gradually work up to solid foods.  You may eat anything you prefer, but it is better to start with liquids, then soup and crackers, and gradually work up to solid foods.   3) Please notify your doctor immediately if you have any unusual bleeding, trouble breathing, redness and pain at the surgery site, drainage, fever, or pain not relieved by medication.    4) Additional Instructions:        Please contact your physician with any problems or Same Day Surgery at [phone], Monday through Friday 6 am to 4 pm, or [place] at [facility] number at [phone].

## 2019-11-08 NOTE — Transfer of Care (Signed)
Immediate Anesthesia Transfer of Care Note  Patient: Patrick Hurst  Procedure(s) Performed: ARTERIOVENOUS (AV) FISTULA CREATION (RADIALCEPHALIC) (Left )  Patient Location: PACU  Anesthesia Type:General  Level of Consciousness: awake and drowsy  Airway & Oxygen Therapy: Patient Spontanous Breathing and Patient connected to face mask oxygen  Post-op Assessment: Report given to RN and Post -op Vital signs reviewed and stable  Post vital signs: Reviewed and stable  Last Vitals:  Vitals Value Taken Time  BP 143/69 11/08/19 1420  Temp    Pulse 72 11/08/19 1420  Resp 21 11/08/19 1420  SpO2 96 % 11/08/19 1420    Last Pain:  Vitals:   11/08/19 1116  TempSrc: Tympanic  PainSc: 0-No pain         Complications: No complications documented.

## 2019-11-08 NOTE — Anesthesia Procedure Notes (Signed)
Procedure Name: LMA Insertion Date/Time: 11/08/2019 12:37 PM Performed by: Debe Coder, CRNA Pre-anesthesia Checklist: Patient identified, Emergency Drugs available, Suction available, Patient being monitored and Timeout performed Patient Re-evaluated:Patient Re-evaluated prior to induction Oxygen Delivery Method: Circle system utilized and Simple face mask Preoxygenation: Pre-oxygenation with 100% oxygen Induction Type: IV induction Ventilation: Oral airway inserted - appropriate to patient size LMA: LMA inserted LMA Size: 5.0 Number of attempts: 1 Placement Confirmation: positive ETCO2 and breath sounds checked- equal and bilateral

## 2019-11-08 NOTE — Progress Notes (Signed)
Pt is exhibiting low oxygen levels so I gave him an incentive spirometer to help keep him awake and to bring his oxygen levels above 90%. Patient came in with an oxygen level of 95% so I am working on keeping him above 90%. The patient has been able to keep his oxygen above 90% for at least 5 minutes therefore I feel he is ready to go home. The wife stated he has a history of low oxygen after procedures. I feel comfortable sending the patient home with his wife and they stated that they would put his cpap on tonight to make sure he is well oxygenated.

## 2019-11-08 NOTE — H&P (Signed)
[facility] History & Physical Update  The patient was interviewed and re-examined.  The patient's previous History and Physical has been reviewed and is unchanged.  There is no change in the plan of care. We plan to proceed with the scheduled procedure.  Leotis Pain, MD  11/08/2019, 11:55 AM

## 2019-11-08 NOTE — Anesthesia Preprocedure Evaluation (Addendum)
Anesthesia Evaluation  Patient identified by MRN, date of birth, ID band Patient awake    Reviewed: Allergy & Precautions, H&P , NPO status , reviewed documented beta blocker date and time   Airway Mallampati: III  TM Distance: >3 FB Neck ROM: limited    Dental  (+) Poor Dentition, Chipped, Missing Very poor dentition, none loose:   Pulmonary sleep apnea, Continuous Positive Airway Pressure Ventilation and Oxygen sleep apnea , pneumonia, resolved, COPD, former smoker,    Pulmonary exam normal        Cardiovascular hypertension, +CHF  Normal cardiovascular exam  03/2019 ECHO IMPRESSIONS    1. Left ventricular ejection fraction, by visual estimation, is 55 to  60%. The left ventricle has normal function. There is mildly increased  left ventricular hypertrophy.  2. Global right ventricle has normal systolic function.The right  ventricular size is normal. No increase in right ventricular wall  thickness.  3. Left atrial size was normal.  4. Right atrial size was normal.  5. The mitral valve is normal in structure. Mild mitral valve  regurgitation.  6. The tricuspid valve is normal in structure. Tricuspid valve  regurgitation is trivial.  7. The aortic valve is normal in structure. Aortic valve regurgitation is  not visualized.  8. The pulmonic valve was grossly normal. Pulmonic valve regurgitation is  not visualized.    Neuro/Psych PSYCHIATRIC DISORDERS Depression  Neuromuscular disease    GI/Hepatic PUD, GERD  Controlled,  Endo/Other  diabetes, Type 2Hypothyroidism Morbid obesity  Renal/GU CRF and DialysisRenal diseaseDialyzed yesterday     Musculoskeletal  (+) Arthritis ,   Abdominal   Peds  Hematology  (+) Blood dyscrasia, anemia ,   Anesthesia Other Findings Past Medical History: 06/21/2018: Acute on chronic diastolic CHF (congestive heart failure)  ([place]) 02/12/2015: Bell palsy 02/12/2015: Carpal  tunnel syndrome No date: Dependence on nocturnal oxygen therapy     Comment:  2 LITERS WITH BIPAP No date: Depression No date: Gastric ulcer No date: GERD (gastroesophageal reflux disease) No date: Gout No date: History of chicken pox 03/04/2019: Multifocal pneumonia No date: Sleep apnea treated with nocturnal BiPAP  Past Surgical History: 2014 and 2015: CATARACT EXTRACTION; Bilateral 04/28/2011: CHOLECYSTECTOMY     Comment:  Laproscopic; Dr. Pat Patrick 05/24/2019: DIALYSIS/PERMA CATHETER INSERTION; N/A     Comment:  Procedure: DIALYSIS/PERMA CATHETER INSERTION;  Surgeon:               Algernon Huxley, MD;  Location: [facility];                Service: Cardiovascular;  Laterality: N/A; 10/09/2019: DIALYSIS/PERMA CATHETER INSERTION; N/A     Comment:  Procedure: DIALYSIS/PERMA CATHETER INSERTION;  Surgeon:               Katha Cabal, MD;  Location: [facility];               Service: Cardiovascular;  Laterality: N/A; No date: EYE SURGERY No date: GALLBLADDER SURGERY 2019: LASIK; Bilateral     Comment:  medical 2012: SHOULDER SURGERY; Right     Comment:  Dr. Leanor Kail     Reproductive/Obstetrics                           Anesthesia Physical Anesthesia Plan  ASA: IV  Anesthesia Plan: General ETT and General LMA   Post-op Pain Management:  Regional for Post-op pain   Induction: Intravenous  PONV  Risk Score and Plan: 2 and Treatment may vary due to age or medical condition, TIVA, Ondansetron and Midazolam  Airway Management Planned: LMA and Oral ETT  Additional Equipment:   Intra-op Plan:   Post-operative Plan:   Informed Consent: I have reviewed the patients History and Physical, chart, labs and discussed the procedure including the risks, benefits and alternatives for the proposed anesthesia with the patient or authorized representative who has indicated his/her understanding and acceptance.     Dental Advisory  Given  Plan Discussed with: CRNA  Anesthesia Plan Comments: (Only 24 hours since last Eliquis dose, will avoid regional block, use GA instead)      Anesthesia Quick Evaluation

## 2019-11-08 NOTE — Op Note (Signed)
[facility]   OPERATIVE NOTE   PROCEDURE: Left radiocephaic arteriovenous fistula placement  PRE-OPERATIVE DIAGNOSIS: 1. ESRD  POST-OPERATIVE DIAGNOSIS: Same  SURGEON: Leotis Pain, MD  ASSISTANT(S): Hezzie Bump, PA-C  ANESTHESIA: general  ESTIMATED BLOOD LOSS: 10 cc  FINDING(S): none  SPECIMEN(S):  None  INDICATIONS:   Patrick Hurst is a 64 y.o. male who presents with renal failure and need for a permanent hemodialysis access.  The patient is scheduled for left radiocephalic arteriovenous fistula placement when non-invasive studies suggested adequate anatomy for fistula creation at this location.  The patient is aware the risks include but are not limited to: bleeding, infection, steal syndrome, nerve damage, ischemic monomelic neuropathy, failure to mature, and need for additional procedures.  The patient is aware of the risks of the procedure and elects to proceed forward. An assistant was present during the procedure to help facilitate the exposure and expedite the procedure.  DESCRIPTION: After full informed written consent was obtained from the patient, the patient was brought back to the operating room and placed supine upon the operating table.  Prior to induction, the patient received IV antibiotics.   After obtaining adequate anesthesia, the patient was then prepped and draped in the standard fashion for a left arm access procedure. The assistant provided retraction and mobilization to help facilitate exposure and expedite the procedure throughout the entire procedure.  This included following suture, using retractors, and optimizing lighting. I turned my attention first to identifying the patient's distal cephalic vein and radial artery.  I made an incision at the level of the distal forearm and wrist and dissected through the subcutaneous tissue and fascia to gain exposure of the radial artery.  This was noted to be of decent size and useable for  fistula creation.  This was dissected out proximally and distally and controlled with vessel loops.  I then dissected out the cephalic vein.  This was noted to be patent and adequate size for fistula creation. The distal segment of the vein was ligated with a  2-0 silk, and the vein was transected.  I then instilled the heparinized saline into the vein and clamped it.  At this point, I reset my exposure of the radial artery and placed the artery under tension proximally and distally.  I made an arteriotomy with a #11 blade, and then I extended the arteriotomy with a Potts scissor.  I injected heparinized saline proximal and distal to this arteriotomy.  The vein was then sewn to the artery in an end-to-side configuration with a running stitch of 6-0 Prolene.  Prior to completing this anastomosis, I allowed the vein and artery to backbleed.  There was no evidence of clot from any vessels.  I completed the anastomosis in the usual fashion and then released all vessel loops and clamps.  There was a palpable  thrill in the venous outflow, and there was a palpable radial pulse beyond the anastomosis.  At this point, I irrigated out the surgical wound. Surgicel was placed. There was no further active bleeding.  The subcutaneous tissue was reapproximated with a running stitch of 3-0 Vicryl.  The skin was then reapproximated with a running subcuticular stitch of 4-0 Monocryl.  The skin was then cleaned, dried, and reinforced with Dermabond.  The patient tolerated this procedure well and was taken to the recovery room in stable condition  COMPLICATIONS: None  CONDITION: Stable   Leotis Pain, MD 11/08/2019 2:13 PM   This note was created  with Dragon Medical transcription system. Any errors in dictation are purely unintentional. 

## 2019-11-08 NOTE — Anesthesia Procedure Notes (Signed)
Performed by: Debe Coder, CRNA

## 2019-11-09 ENCOUNTER — Encounter: Payer: Self-pay | Admitting: Vascular Surgery

## 2019-11-12 ENCOUNTER — Other Ambulatory Visit: Payer: Self-pay | Admitting: Family Medicine

## 2019-11-12 ENCOUNTER — Encounter: Payer: Self-pay | Admitting: Family Medicine

## 2019-11-12 ENCOUNTER — Other Ambulatory Visit: Payer: Self-pay

## 2019-11-12 ENCOUNTER — Ambulatory Visit (INDEPENDENT_AMBULATORY_CARE_PROVIDER_SITE_OTHER): Payer: Commercial Managed Care - PPO | Admitting: Family Medicine

## 2019-11-12 ENCOUNTER — Ambulatory Visit
Admission: RE | Admit: 2019-11-12 | Discharge: 2019-11-12 | Disposition: A | Discharge status: Home / Self Care | Payer: Commercial Managed Care - PPO | Source: Ambulatory Visit | Attending: Family Medicine | Admitting: Family Medicine

## 2019-11-12 ENCOUNTER — Ambulatory Visit
Admission: RE | Admit: 2019-11-12 | Discharge: 2019-11-12 | Disposition: A | Discharge status: Home / Self Care | Payer: Commercial Managed Care - PPO | Source: Home / Self Care | Attending: Family Medicine | Admitting: Family Medicine

## 2019-11-12 VITALS — BP 140/64 | HR 77 | Temp 97.5°F | Resp 18 | Wt 309.4 lb

## 2019-11-12 DIAGNOSIS — E8770 Fluid overload, unspecified: Secondary | ICD-10-CM | POA: Diagnosis not present

## 2019-11-12 DIAGNOSIS — N2581 Secondary hyperparathyroidism of renal origin: Secondary | ICD-10-CM | POA: Diagnosis present

## 2019-11-12 DIAGNOSIS — I340 Nonrheumatic mitral (valve) insufficiency: Secondary | ICD-10-CM | POA: Diagnosis not present

## 2019-11-12 DIAGNOSIS — E1140 Type 2 diabetes mellitus with diabetic neuropathy, unspecified: Secondary | ICD-10-CM | POA: Diagnosis present

## 2019-11-12 DIAGNOSIS — K7581 Nonalcoholic steatohepatitis (NASH): Secondary | ICD-10-CM | POA: Diagnosis not present

## 2019-11-12 DIAGNOSIS — E11319 Type 2 diabetes mellitus with unspecified diabetic retinopathy without macular edema: Secondary | ICD-10-CM | POA: Diagnosis present

## 2019-11-12 DIAGNOSIS — I5032 Chronic diastolic (congestive) heart failure: Secondary | ICD-10-CM | POA: Diagnosis not present

## 2019-11-12 DIAGNOSIS — I10 Essential (primary) hypertension: Secondary | ICD-10-CM | POA: Diagnosis not present

## 2019-11-12 DIAGNOSIS — G4733 Obstructive sleep apnea (adult) (pediatric): Secondary | ICD-10-CM | POA: Diagnosis present

## 2019-11-12 DIAGNOSIS — K219 Gastro-esophageal reflux disease without esophagitis: Secondary | ICD-10-CM | POA: Diagnosis present

## 2019-11-12 DIAGNOSIS — D631 Anemia in chronic kidney disease: Secondary | ICD-10-CM | POA: Diagnosis not present

## 2019-11-12 DIAGNOSIS — J9601 Acute respiratory failure with hypoxia: Secondary | ICD-10-CM | POA: Diagnosis not present

## 2019-11-12 DIAGNOSIS — D62 Acute posthemorrhagic anemia: Secondary | ICD-10-CM | POA: Diagnosis not present

## 2019-11-12 DIAGNOSIS — Z992 Dependence on renal dialysis: Secondary | ICD-10-CM

## 2019-11-12 DIAGNOSIS — N186 End stage renal disease: Secondary | ICD-10-CM | POA: Diagnosis present

## 2019-11-12 DIAGNOSIS — Z794 Long term (current) use of insulin: Secondary | ICD-10-CM | POA: Diagnosis not present

## 2019-11-12 DIAGNOSIS — R011 Cardiac murmur, unspecified: Secondary | ICD-10-CM | POA: Diagnosis not present

## 2019-11-12 DIAGNOSIS — J449 Chronic obstructive pulmonary disease, unspecified: Secondary | ICD-10-CM | POA: Diagnosis not present

## 2019-11-12 DIAGNOSIS — D509 Iron deficiency anemia, unspecified: Secondary | ICD-10-CM | POA: Diagnosis present

## 2019-11-12 DIAGNOSIS — Z9981 Dependence on supplemental oxygen: Secondary | ICD-10-CM

## 2019-11-12 DIAGNOSIS — Z20822 Contact with and (suspected) exposure to covid-19: Secondary | ICD-10-CM | POA: Diagnosis present

## 2019-11-12 DIAGNOSIS — E785 Hyperlipidemia, unspecified: Secondary | ICD-10-CM | POA: Diagnosis present

## 2019-11-12 DIAGNOSIS — K7460 Unspecified cirrhosis of liver: Secondary | ICD-10-CM | POA: Diagnosis not present

## 2019-11-12 DIAGNOSIS — I120 Hypertensive chronic kidney disease with stage 5 chronic kidney disease or end stage renal disease: Secondary | ICD-10-CM | POA: Diagnosis not present

## 2019-11-12 DIAGNOSIS — M109 Gout, unspecified: Secondary | ICD-10-CM | POA: Diagnosis present

## 2019-11-12 DIAGNOSIS — J440 Chronic obstructive pulmonary disease with acute lower respiratory infection: Secondary | ICD-10-CM | POA: Diagnosis present

## 2019-11-12 DIAGNOSIS — K7469 Other cirrhosis of liver: Secondary | ICD-10-CM | POA: Diagnosis not present

## 2019-11-12 DIAGNOSIS — J9621 Acute and chronic respiratory failure with hypoxia: Secondary | ICD-10-CM | POA: Diagnosis present

## 2019-11-12 DIAGNOSIS — J189 Pneumonia, unspecified organism: Secondary | ICD-10-CM | POA: Diagnosis present

## 2019-11-12 DIAGNOSIS — I480 Paroxysmal atrial fibrillation: Secondary | ICD-10-CM | POA: Diagnosis present

## 2019-11-12 DIAGNOSIS — Z6841 Body Mass Index (BMI) 40.0 and over, adult: Secondary | ICD-10-CM | POA: Diagnosis not present

## 2019-11-12 DIAGNOSIS — I5033 Acute on chronic diastolic (congestive) heart failure: Secondary | ICD-10-CM | POA: Diagnosis present

## 2019-11-12 DIAGNOSIS — R0602 Shortness of breath: Secondary | ICD-10-CM | POA: Diagnosis present

## 2019-11-12 DIAGNOSIS — I440 Atrioventricular block, first degree: Secondary | ICD-10-CM | POA: Diagnosis present

## 2019-11-12 DIAGNOSIS — E039 Hypothyroidism, unspecified: Secondary | ICD-10-CM | POA: Diagnosis present

## 2019-11-12 DIAGNOSIS — E1122 Type 2 diabetes mellitus with diabetic chronic kidney disease: Secondary | ICD-10-CM | POA: Diagnosis present

## 2019-11-12 DIAGNOSIS — K769 Liver disease, unspecified: Secondary | ICD-10-CM | POA: Diagnosis not present

## 2019-11-12 DIAGNOSIS — I132 Hypertensive heart and chronic kidney disease with heart failure and with stage 5 chronic kidney disease, or end stage renal disease: Secondary | ICD-10-CM | POA: Diagnosis present

## 2019-11-12 DIAGNOSIS — D6859 Other primary thrombophilia: Secondary | ICD-10-CM | POA: Diagnosis present

## 2019-11-12 DIAGNOSIS — J9691 Respiratory failure, unspecified with hypoxia: Secondary | ICD-10-CM | POA: Diagnosis not present

## 2019-11-12 DIAGNOSIS — D638 Anemia in other chronic diseases classified elsewhere: Secondary | ICD-10-CM | POA: Diagnosis not present

## 2019-11-12 DIAGNOSIS — J441 Chronic obstructive pulmonary disease with (acute) exacerbation: Secondary | ICD-10-CM | POA: Diagnosis not present

## 2019-11-12 LAB — POCT GLYCOSYLATED HEMOGLOBIN (HGB A1C): Hemoglobin A1C: 5.8 % — AB (ref 4.0–5.6)

## 2019-11-12 MED ORDER — AMLODIPINE BESYLATE 5 MG PO TABS: 5.0000 mg | ORAL_TABLET | Freq: Every day | ORAL | 4 refills | Status: DC

## 2019-11-12 MED ORDER — DOXYCYCLINE HYCLATE 100 MG PO TABS
100.0000 mg | ORAL_TABLET | Freq: Two times a day (BID) | ORAL | 0 refills | Status: DC
Start: 2019-11-12 — End: 2019-11-23

## 2019-11-12 NOTE — Patient Instructions (Addendum)
Type 2 Diabetes Mellitus, Self Care, Adult When you have type 2 diabetes (type 2 diabetes mellitus), you must make sure your blood sugar (glucose) stays in a healthy range. You can do this with:  Nutrition.  Exercise.  Lifestyle changes.  Medicines or insulin, if needed.  Support from your doctors and others. How to stay aware of blood sugar   Check your blood sugar level every day, as often as told.  Have your A1c (hemoglobin A1c) level checked two or more times a year. Have it checked more often if your doctor tells you to. Your doctor will set personal treatment goals for you. Generally, you should have these blood sugar levels:  Before meals (preprandial): 80-130 mg/dL (4.4-7.2 mmol/L).  After meals (postprandial): below 180 mg/dL (10 mmol/L).  A1c level: less than 7%. How to manage high and low blood sugar Signs of high blood sugar High blood sugar is called hyperglycemia. Know the signs of high blood sugar. Signs may include:  Feeling: ? Thirsty. ? Hungry. ? Very tired.  Needing to pee (urinate) more than usual.  Blurry vision. Signs of low blood sugar Low blood sugar is called hypoglycemia. This is when blood sugar is at or below 70 mg/dL (3.9 mmol/L). Signs may include:  Feeling: ? Hungry. ? Worried or nervous (anxious). ? Sweaty and clammy. ? Confused. ? Dizzy. ? Sleepy. ? Sick to your stomach (nauseous).  Having: ? A fast heartbeat. ? A headache. ? A change in your vision. ? Jerky movements that you cannot control (seizure). ? Tingling or no feeling (numbness) around your mouth, lips, or tongue.  Having trouble with: ? Moving (coordination). ? Sleeping. ? Passing out (fainting). ? Getting upset easily (irritability). Treating low blood sugar To treat low blood sugar, eat or drink something sugary right away. If you can think clearly and swallow safely, follow the 15:15 rule:  Take 15 grams of a fast-acting carb (carbohydrate). Talk with your  doctor about how much you should take.  Some fast-acting carbs are: ? Sugar tablets (glucose pills). Take 3-4 pills. ? 6-8 pieces of hard candy. ? 4-6 oz (120-150 mL) of fruit juice. ? 4-6 oz (120-150 mL) of regular (not diet) soda. ? 1 Tbsp (15 mL) honey or sugar.  Check your blood sugar 15 minutes after you take the carb.  If your blood sugar is still at or below 70 mg/dL (3.9 mmol/L), take 15 grams of a carb again.  If your blood sugar does not go above 70 mg/dL (3.9 mmol/L) after 3 tries, get help right away.  After your blood sugar goes back to normal, eat a meal or a snack within 1 hour. Treating very low blood sugar If your blood sugar is at or below 54 mg/dL (3 mmol/L), you have very low blood sugar (severe hypoglycemia). This is an emergency. Do not wait to see if the symptoms will go away. Get medical help right away. Call your local emergency services (911 in the [place] If you have very low blood sugar and you cannot eat or drink, you may need a glucagon shot (injection). A family member or friend should learn how to check your blood sugar and how to give you a glucagon shot. Ask your doctor if you need to have a glucagon shot kit at home. Follow these instructions at home: Medicine  Take insulin and diabetes medicines as told.  If your doctor says you should take more or less insulin and medicines, do this exactly as told.  Do not run out of insulin or medicines. Having diabetes can raise your risk for other long-term conditions. These include heart disease and kidney disease. Your doctor may prescribe medicines to help you not have these problems. Food   Make healthy food choices. These include: ? Chicken, fish, egg whites, and beans. ? Oats, whole wheat, bulgur, brown rice, quinoa, and millet. ? Fresh fruits and vegetables. ? Low-fat dairy products. ? Nuts, avocado, olive oil, and canola oil.  Meet with a food specialist (dietitian). He or she can help you make an  eating plan that is right for you.  Follow instructions from your doctor about what you cannot eat or drink.  Drink enough fluid to keep your pee (urine) pale yellow.  Keep track of carbs that you eat. Do this by reading food labels and learning food serving sizes.  Follow your sick day plan when you cannot eat or drink normally. Make this plan with your doctor so it is ready to use. Activity  Exercise 3 or more times a week.  Do not go more than 2 days without exercising.  Talk with your doctor before you start a new exercise. Your doctor may need to tell you to change: ? How much insulin or medicines you take. ? How much food you eat. Lifestyle  Do not use any tobacco products. These include cigarettes, chewing tobacco, and e-cigarettes. If you need help quitting, ask your doctor.  Ask your doctor how much alcohol is safe for you.  Learn to deal with stress. If you need help with this, ask your doctor. Body care   Stay up to date with your shots (immunizations).  Have your eyes and feet checked by a doctor as often as told.  Check your skin and feet every day. Check for cuts, bruises, redness, blisters, or sores.  Brush your teeth and gums two times a day. Floss one or more times a day.  Go to the dentist one or more times every 6 months.  Stay at a healthy weight. General instructions  Take over-the-counter and prescription medicines only as told by your doctor.  Share your diabetes care plan with: ? Your work or school. ? People you live with.  Carry a card or wear jewelry that says you have diabetes.  Keep all follow-up visits as told by your doctor. This is important. Questions to ask your doctor  Do I need to meet with a diabetes educator?  Where can I find a support group for people with diabetes? Where to find more information To learn more about diabetes, visit:  American Diabetes Association: www.diabetes.org  American Association of Diabetes  Educators: www.diabeteseducator.org Summary  When you have type 2 diabetes, you must make sure your blood sugar (glucose) stays in a healthy range.  Check your blood sugar every day, as often as told.  Having diabetes can raise your risk for other conditions. Your doctor may prescribe medicines to help you not have these problems.  Keep all follow-up visits as told by your doctor. This is important. This information is not intended to replace advice given to you by your health care provider. Make sure you discuss any questions you have with your health care provider. Document Revised: 10/10/2017 Document Reviewed: 05/23/2015 Elsevier Patient Education  2020 [employer]. Managing Your Hypertension Hypertension is commonly called high blood pressure. This is when the force of your blood pressing against the walls of your arteries is too strong. Arteries are blood vessels that carry  blood from your heart throughout your body. Hypertension forces the heart to work harder to pump blood, and may cause the arteries to become narrow or stiff. Having untreated or uncontrolled hypertension can cause heart attack, stroke, kidney disease, and other problems. What are blood pressure readings? A blood pressure reading consists of a higher number over a lower number. Ideally, your blood pressure should be below 120/80. The first ("top") number is called the systolic pressure. It is a measure of the pressure in your arteries as your heart beats. The second ("bottom") number is called the diastolic pressure. It is a measure of the pressure in your arteries as the heart relaxes. What does my blood pressure reading mean? Blood pressure is classified into four stages. Based on your blood pressure reading, your health care provider may use the following stages to determine what type of treatment you need, if any. Systolic pressure and diastolic pressure are measured in a unit called mm Hg. Normal  Systolic  pressure: below 120.  Diastolic pressure: below 80. Elevated  Systolic pressure: 333-545.  Diastolic pressure: below 80. Hypertension stage 1  Systolic pressure: 625-638.  Diastolic pressure: 93-73. Hypertension stage 2  Systolic pressure: 428 or above.  Diastolic pressure: 90 or above. What health risks are associated with hypertension? Managing your hypertension is an important responsibility. Uncontrolled hypertension can lead to:  A heart attack.  A stroke.  A weakened blood vessel (aneurysm).  Heart failure.  Kidney damage.  Eye damage.  Metabolic syndrome.  Memory and concentration problems. What changes can I make to manage my hypertension? Hypertension can be managed by making lifestyle changes and possibly by taking medicines. Your health care provider will help you make a plan to bring your blood pressure within a normal range. Eating and drinking   Eat a diet that is high in fiber and potassium, and low in salt (sodium), added sugar, and fat. An example eating plan is called the DASH (Dietary Approaches to Stop Hypertension) diet. To eat this way: ? Eat plenty of fresh fruits and vegetables. Try to fill half of your plate at each meal with fruits and vegetables. ? Eat whole grains, such as whole wheat pasta, brown rice, or whole grain bread. Fill about one quarter of your plate with whole grains. ? Eat low-fat diary products. ? Avoid fatty cuts of meat, processed or cured meats, and poultry with skin. Fill about one quarter of your plate with lean proteins such as fish, chicken without skin, beans, eggs, and tofu. ? Avoid premade and processed foods. These tend to be higher in sodium, added sugar, and fat.  Reduce your daily sodium intake. Most people with hypertension should eat less than 1,500 mg of sodium a day.  Limit alcohol intake to no more than 1 drink a day for nonpregnant women and 2 drinks a day for men. One drink equals 12 oz of beer, 5 oz of  wine, or 1 oz of hard liquor. Lifestyle  Work with your health care provider to maintain a healthy body weight, or to lose weight. Ask what an ideal weight is for you.  Get at least 30 minutes of exercise that causes your heart to beat faster (aerobic exercise) most days of the week. Activities may include walking, swimming, or biking.  Include exercise to strengthen your muscles (resistance exercise), such as weight lifting, as part of your weekly exercise routine. Try to do these types of exercises for 30 minutes at least 3 days a week.  Do not use any products that contain nicotine or tobacco, such as cigarettes and e-cigarettes. If you need help quitting, ask your health care provider.  Control any long-term (chronic) conditions you have, such as high cholesterol or diabetes. Monitoring  Monitor your blood pressure at home as told by your health care provider. Your personal target blood pressure may vary depending on your medical conditions, your age, and other factors.  Have your blood pressure checked regularly, as often as told by your health care provider. Working with your health care provider  Review all the medicines you take with your health care provider because there may be side effects or interactions.  Talk with your health care provider about your diet, exercise habits, and other lifestyle factors that may be contributing to hypertension.  Visit your health care provider regularly. Your health care provider can help you create and adjust your plan for managing hypertension. Will I need medicine to control my blood pressure? Your health care provider may prescribe medicine if lifestyle changes are not enough to get your blood pressure under control, and if:  Your systolic blood pressure is 130 or higher.  Your diastolic blood pressure is 80 or higher. Take medicines only as told by your health care provider. Follow the directions carefully. Blood pressure medicines must  be taken as prescribed. The medicine does not work as well when you skip doses. Skipping doses also puts you at risk for problems. Contact a health care provider if:  You think you are having a reaction to medicines you have taken.  You have repeated (recurrent) headaches.  You feel dizzy.  You have swelling in your ankles.  You have trouble with your vision. Get help right away if:  You develop a severe headache or confusion.  You have unusual weakness or numbness, or you feel faint.  You have severe pain in your chest or abdomen.  You vomit repeatedly.  You have trouble breathing. Summary  Hypertension is when the force of blood pumping through your arteries is too strong. If this condition is not controlled, it may put you at risk for serious complications.  Your personal target blood pressure may vary depending on your medical conditions, your age, and other factors. For most people, a normal blood pressure is less than 120/80.  Hypertension is managed by lifestyle changes, medicines, or both. Lifestyle changes include weight loss, eating a healthy, low-sodium diet, exercising more, and limiting alcohol. This information is not intended to replace advice given to you by your health care provider. Make sure you discuss any questions you have with your health care provider. Document Revised: 08/11/2018 Document Reviewed: 03/17/2016 Elsevier Patient Education  [employer].   Hypothyroidism  Hypothyroidism is when the thyroid gland does not make enough of certain hormones (it is underactive). The thyroid gland is a small gland located in the lower front part of the neck, just in front of the windpipe (trachea). This gland makes hormones that help control how the body uses food for energy (metabolism) as well as how the heart and brain function. These hormones also play a role in keeping your bones strong. When the thyroid is underactive, it produces too little of the  hormones thyroxine (T4) and triiodothyronine (T3). What are the causes? This condition may be caused by:  Hashimoto's disease. This is a disease in which the body's disease-fighting system (immune system) attacks the thyroid gland. This is the most common cause.  Viral infections.  Pregnancy.  Certain  medicines.  Birth defects.  Past radiation treatments to the head or neck for cancer.  Past treatment with radioactive iodine.  Past exposure to radiation in the environment.  Past surgical removal of part or all of the thyroid.  Problems with a gland in the center of the brain (pituitary gland).  Lack of enough iodine in the diet. What increases the risk? You are more likely to develop this condition if:  You are male.  You have a family history of thyroid conditions.  You use a medicine called lithium.  You take medicines that affect the immune system (immunosuppressants). What are the signs or symptoms? Symptoms of this condition include:  Feeling as though you have no energy (lethargy).  Not being able to tolerate cold.  Weight gain that is not explained by a change in diet or exercise habits.  Lack of appetite.  Dry skin.  Coarse hair.  Menstrual irregularity.  Slowing of thought processes.  Constipation.  Sadness or depression. How is this diagnosed? This condition may be diagnosed based on:  Your symptoms, your medical history, and a physical exam.  Blood tests. You may also have imaging tests, such as an ultrasound or MRI. How is this treated? This condition is treated with medicine that replaces the thyroid hormones that your body does not make. After you begin treatment, it may take several weeks for symptoms to go away. Follow these instructions at home:  Take over-the-counter and prescription medicines only as told by your health care provider.  If you start taking any new medicines, tell your health care provider.  Keep all follow-up  visits as told by your health care provider. This is important. ? As your condition improves, your dosage of thyroid hormone medicine may change. ? You will need to have blood tests regularly so that your health care provider can monitor your condition. Contact a health care provider if:  Your symptoms do not get better with treatment.  You are taking thyroid replacement medicine and you: ? Sweat a lot. ? Have tremors. ? Feel anxious. ? Lose weight rapidly. ? Cannot tolerate heat. ? Have emotional swings. ? Have diarrhea. ? Feel weak. Get help right away if you have:  Chest pain.  An irregular heartbeat.  A rapid heartbeat.  Difficulty breathing. Summary  Hypothyroidism is when the thyroid gland does not make enough of certain hormones (it is underactive).  When the thyroid is underactive, it produces too little of the hormones thyroxine (T4) and triiodothyronine (T3).  The most common cause is Hashimoto's disease, a disease in which the body's disease-fighting system (immune system) attacks the thyroid gland. The condition can also be caused by viral infections, medicine, pregnancy, or past radiation treatment to the head or neck.  Symptoms may include weight gain, dry skin, constipation, feeling as though you do not have energy, and not being able to tolerate cold.  This condition is treated with medicine to replace the thyroid hormones that your body does not make. This information is not intended to replace advice given to you by your health care provider. Make sure you discuss any questions you have with your health care provider. Document Revised: 04/01/2017 Document Reviewed: 03/30/2017 Elsevier Patient Education  2020 [employer].

## 2019-11-15 ENCOUNTER — Inpatient Hospital Stay
Admission: EM | Admit: 2019-11-15 | Discharge: 2019-11-23 | DRG: 193 | Disposition: A | Discharge status: Home / Self Care | Payer: Commercial Managed Care - PPO | Attending: Family Medicine | Admitting: Family Medicine

## 2019-11-15 ENCOUNTER — Emergency Department: Payer: Commercial Managed Care - PPO

## 2019-11-15 ENCOUNTER — Telehealth: Payer: Self-pay | Admitting: Family Medicine

## 2019-11-15 ENCOUNTER — Other Ambulatory Visit: Payer: Self-pay

## 2019-11-15 DIAGNOSIS — E1122 Type 2 diabetes mellitus with diabetic chronic kidney disease: Secondary | ICD-10-CM | POA: Diagnosis present

## 2019-11-15 DIAGNOSIS — K219 Gastro-esophageal reflux disease without esophagitis: Secondary | ICD-10-CM | POA: Diagnosis present

## 2019-11-15 DIAGNOSIS — J9601 Acute respiratory failure with hypoxia: Secondary | ICD-10-CM

## 2019-11-15 DIAGNOSIS — Z79899 Other long term (current) drug therapy: Secondary | ICD-10-CM

## 2019-11-15 DIAGNOSIS — J9621 Acute and chronic respiratory failure with hypoxia: Secondary | ICD-10-CM | POA: Diagnosis present

## 2019-11-15 DIAGNOSIS — G4733 Obstructive sleep apnea (adult) (pediatric): Secondary | ICD-10-CM | POA: Diagnosis present

## 2019-11-15 DIAGNOSIS — D6859 Other primary thrombophilia: Secondary | ICD-10-CM | POA: Diagnosis present

## 2019-11-15 DIAGNOSIS — Z881 Allergy status to other antibiotic agents status: Secondary | ICD-10-CM

## 2019-11-15 DIAGNOSIS — D631 Anemia in chronic kidney disease: Secondary | ICD-10-CM | POA: Diagnosis present

## 2019-11-15 DIAGNOSIS — J189 Pneumonia, unspecified organism: Secondary | ICD-10-CM | POA: Diagnosis not present

## 2019-11-15 DIAGNOSIS — J9691 Respiratory failure, unspecified with hypoxia: Secondary | ICD-10-CM | POA: Diagnosis not present

## 2019-11-15 DIAGNOSIS — J440 Chronic obstructive pulmonary disease with acute lower respiratory infection: Secondary | ICD-10-CM | POA: Diagnosis present

## 2019-11-15 DIAGNOSIS — I5032 Chronic diastolic (congestive) heart failure: Secondary | ICD-10-CM | POA: Diagnosis present

## 2019-11-15 DIAGNOSIS — Z992 Dependence on renal dialysis: Secondary | ICD-10-CM

## 2019-11-15 DIAGNOSIS — Z809 Family history of malignant neoplasm, unspecified: Secondary | ICD-10-CM

## 2019-11-15 DIAGNOSIS — E039 Hypothyroidism, unspecified: Secondary | ICD-10-CM | POA: Diagnosis not present

## 2019-11-15 DIAGNOSIS — I132 Hypertensive heart and chronic kidney disease with heart failure and with stage 5 chronic kidney disease, or end stage renal disease: Secondary | ICD-10-CM | POA: Diagnosis present

## 2019-11-15 DIAGNOSIS — E1140 Type 2 diabetes mellitus with diabetic neuropathy, unspecified: Secondary | ICD-10-CM | POA: Diagnosis present

## 2019-11-15 DIAGNOSIS — Z8249 Family history of ischemic heart disease and other diseases of the circulatory system: Secondary | ICD-10-CM

## 2019-11-15 DIAGNOSIS — E785 Hyperlipidemia, unspecified: Secondary | ICD-10-CM | POA: Diagnosis present

## 2019-11-15 DIAGNOSIS — M109 Gout, unspecified: Secondary | ICD-10-CM | POA: Diagnosis present

## 2019-11-15 DIAGNOSIS — I5033 Acute on chronic diastolic (congestive) heart failure: Secondary | ICD-10-CM | POA: Diagnosis present

## 2019-11-15 DIAGNOSIS — E8881 Metabolic syndrome: Secondary | ICD-10-CM | POA: Diagnosis present

## 2019-11-15 DIAGNOSIS — E11319 Type 2 diabetes mellitus with unspecified diabetic retinopathy without macular edema: Secondary | ICD-10-CM | POA: Diagnosis present

## 2019-11-15 DIAGNOSIS — M79604 Pain in right leg: Secondary | ICD-10-CM

## 2019-11-15 DIAGNOSIS — Z87891 Personal history of nicotine dependence: Secondary | ICD-10-CM

## 2019-11-15 DIAGNOSIS — I440 Atrioventricular block, first degree: Secondary | ICD-10-CM | POA: Diagnosis present

## 2019-11-15 DIAGNOSIS — R0602 Shortness of breath: Secondary | ICD-10-CM | POA: Diagnosis present

## 2019-11-15 DIAGNOSIS — D638 Anemia in other chronic diseases classified elsewhere: Secondary | ICD-10-CM | POA: Diagnosis not present

## 2019-11-15 DIAGNOSIS — I10 Essential (primary) hypertension: Secondary | ICD-10-CM | POA: Diagnosis present

## 2019-11-15 DIAGNOSIS — D62 Acute posthemorrhagic anemia: Secondary | ICD-10-CM | POA: Diagnosis not present

## 2019-11-15 DIAGNOSIS — K7581 Nonalcoholic steatohepatitis (NASH): Secondary | ICD-10-CM | POA: Diagnosis present

## 2019-11-15 DIAGNOSIS — R7989 Other specified abnormal findings of blood chemistry: Secondary | ICD-10-CM | POA: Diagnosis not present

## 2019-11-15 DIAGNOSIS — I480 Paroxysmal atrial fibrillation: Secondary | ICD-10-CM | POA: Diagnosis present

## 2019-11-15 DIAGNOSIS — Z20822 Contact with and (suspected) exposure to covid-19: Secondary | ICD-10-CM | POA: Diagnosis present

## 2019-11-15 DIAGNOSIS — Z79890 Hormone replacement therapy (postmenopausal): Secondary | ICD-10-CM

## 2019-11-15 DIAGNOSIS — R9389 Abnormal findings on diagnostic imaging of other specified body structures: Secondary | ICD-10-CM

## 2019-11-15 DIAGNOSIS — J449 Chronic obstructive pulmonary disease, unspecified: Secondary | ICD-10-CM | POA: Diagnosis present

## 2019-11-15 DIAGNOSIS — K769 Liver disease, unspecified: Secondary | ICD-10-CM

## 2019-11-15 DIAGNOSIS — Z6841 Body Mass Index (BMI) 40.0 and over, adult: Secondary | ICD-10-CM | POA: Diagnosis not present

## 2019-11-15 DIAGNOSIS — Z9981 Dependence on supplemental oxygen: Secondary | ICD-10-CM

## 2019-11-15 DIAGNOSIS — N186 End stage renal disease: Secondary | ICD-10-CM | POA: Diagnosis not present

## 2019-11-15 DIAGNOSIS — D509 Iron deficiency anemia, unspecified: Secondary | ICD-10-CM | POA: Diagnosis present

## 2019-11-15 DIAGNOSIS — N2581 Secondary hyperparathyroidism of renal origin: Secondary | ICD-10-CM | POA: Diagnosis present

## 2019-11-15 DIAGNOSIS — Z888 Allergy status to other drugs, medicaments and biological substances status: Secondary | ICD-10-CM

## 2019-11-15 DIAGNOSIS — Z09 Encounter for follow-up examination after completed treatment for conditions other than malignant neoplasm: Secondary | ICD-10-CM

## 2019-11-15 DIAGNOSIS — R609 Edema, unspecified: Secondary | ICD-10-CM

## 2019-11-15 DIAGNOSIS — Z825 Family history of asthma and other chronic lower respiratory diseases: Secondary | ICD-10-CM

## 2019-11-15 DIAGNOSIS — Z7901 Long term (current) use of anticoagulants: Secondary | ICD-10-CM

## 2019-11-15 DIAGNOSIS — I340 Nonrheumatic mitral (valve) insufficiency: Secondary | ICD-10-CM | POA: Diagnosis not present

## 2019-11-15 DIAGNOSIS — N400 Enlarged prostate without lower urinary tract symptoms: Secondary | ICD-10-CM | POA: Diagnosis present

## 2019-11-15 DIAGNOSIS — Z7951 Long term (current) use of inhaled steroids: Secondary | ICD-10-CM

## 2019-11-15 DIAGNOSIS — K7460 Unspecified cirrhosis of liver: Secondary | ICD-10-CM | POA: Diagnosis not present

## 2019-11-15 DIAGNOSIS — R160 Hepatomegaly, not elsewhere classified: Secondary | ICD-10-CM | POA: Diagnosis present

## 2019-11-15 DIAGNOSIS — Z794 Long term (current) use of insulin: Secondary | ICD-10-CM

## 2019-11-15 LAB — CBC
HCT: 19.6 % — ABNORMAL LOW (ref 39.0–52.0)
Hemoglobin: 6.4 g/dL — ABNORMAL LOW (ref 13.0–17.0)
MCH: 31.1 pg (ref 26.0–34.0)
MCHC: 32.7 g/dL (ref 30.0–36.0)
MCV: 95.1 fL (ref 80.0–100.0)
Platelets: 120 10*3/uL — ABNORMAL LOW (ref 150–400)
RBC: 2.06 MIL/uL — ABNORMAL LOW (ref 4.22–5.81)
RDW: 14.7 % (ref 11.5–15.5)
WBC: 8.3 10*3/uL (ref 4.0–10.5)
nRBC: 0.6 % — ABNORMAL HIGH (ref 0.0–0.2)

## 2019-11-15 LAB — BASIC METABOLIC PANEL
Anion gap: 22 — ABNORMAL HIGH (ref 5–15)
BUN: 124 mg/dL — ABNORMAL HIGH (ref 8–23)
CO2: 19 mmol/L — ABNORMAL LOW (ref 22–32)
Calcium: 7.9 mg/dL — ABNORMAL LOW (ref 8.9–10.3)
Chloride: 93 mmol/L — ABNORMAL LOW (ref 98–111)
Creatinine, Ser: 13.21 mg/dL — ABNORMAL HIGH (ref 0.61–1.24)
GFR calc Af Amer: 4 mL/min — ABNORMAL LOW (ref >60)
GFR calc non Af Amer: 4 mL/min — ABNORMAL LOW (ref >60)
Glucose, Bld: 295 mg/dL — ABNORMAL HIGH (ref 70–99)
Potassium: 5.1 mmol/L (ref 3.5–5.1)
Sodium: 134 mmol/L — ABNORMAL LOW (ref 135–145)

## 2019-11-15 LAB — PREPARE RBC (CROSSMATCH)

## 2019-11-15 LAB — BRAIN NATRIURETIC PEPTIDE: B Natriuretic Peptide: 221.3 pg/mL — ABNORMAL HIGH (ref 0.0–100.0)

## 2019-11-15 LAB — SARS CORONAVIRUS 2 BY RT PCR (HOSPITAL ORDER, PERFORMED IN ~~LOC~~ HOSPITAL LAB): SARS Coronavirus 2: NEGATIVE

## 2019-11-15 LAB — TROPONIN I (HIGH SENSITIVITY): Troponin I (High Sensitivity): 22 ng/L — ABNORMAL HIGH (ref <18)

## 2019-11-15 LAB — LACTIC ACID, PLASMA: Lactic Acid, Venous: 0.8 mmol/L (ref 0.5–1.9)

## 2019-11-15 LAB — PROCALCITONIN: Procalcitonin: 2.15 ng/mL

## 2019-11-15 MED ORDER — LORATADINE 10 MG PO TABS
10.0000 mg | ORAL_TABLET | Freq: Every day | ORAL | Status: DC
  Administered 2019-11-16 – 2019-11-23 (×8): 10 mg via ORAL
  Filled 2019-11-15 (×8): qty 1

## 2019-11-15 MED ORDER — SODIUM CHLORIDE 0.9 % IV SOLN
250.0000 mL | INTRAVENOUS | Status: DC | PRN
  Administered 2019-11-16 – 2019-11-18 (×3): 250 mL via INTRAVENOUS

## 2019-11-15 MED ORDER — SODIUM CHLORIDE 0.9 % IV SOLN
1.0000 g | Freq: Once | INTRAVENOUS | Status: AC
  Administered 2019-11-15: 1 g via INTRAVENOUS
  Filled 2019-11-15: qty 10

## 2019-11-15 MED ORDER — SODIUM CHLORIDE 0.9 % IV SOLN
10.0000 mL/h | Freq: Once | INTRAVENOUS | Status: AC
  Administered 2019-11-15: 10 mL/h via INTRAVENOUS

## 2019-11-15 MED ORDER — AMIODARONE HCL 200 MG PO TABS
200.0000 mg | ORAL_TABLET | Freq: Every day | ORAL | Status: DC
  Administered 2019-11-16 – 2019-11-23 (×8): 200 mg via ORAL
  Filled 2019-11-15 (×9): qty 1

## 2019-11-15 MED ORDER — ACETAMINOPHEN 325 MG PO TABS: 650.0000 mg | ORAL_TABLET | Freq: Four times a day (QID) | ORAL | Status: DC | PRN

## 2019-11-15 MED ORDER — ALBUTEROL SULFATE HFA 108 (90 BASE) MCG/ACT IN AERS: 2.0000 | INHALATION_SPRAY | Freq: Four times a day (QID) | RESPIRATORY_TRACT | Status: DC | PRN

## 2019-11-15 MED ORDER — HYDRALAZINE HCL 10 MG PO TABS
10.0000 mg | ORAL_TABLET | Freq: Three times a day (TID) | ORAL | Status: DC
  Administered 2019-11-15 – 2019-11-21 (×15): 10 mg via ORAL
  Filled 2019-11-15 (×20): qty 1

## 2019-11-15 MED ORDER — SODIUM CHLORIDE 0.9 % IV SOLN
2.0000 g | INTRAVENOUS | Status: AC
  Administered 2019-11-16 – 2019-11-19 (×4): 2 g via INTRAVENOUS
  Filled 2019-11-15 (×4): qty 2

## 2019-11-15 MED ORDER — LEVOTHYROXINE SODIUM 100 MCG PO TABS
100.0000 ug | ORAL_TABLET | Freq: Every day | ORAL | Status: DC
  Administered 2019-11-16 – 2019-11-23 (×8): 100 ug via ORAL
  Filled 2019-11-15 (×8): qty 1

## 2019-11-15 MED ORDER — SODIUM CHLORIDE 0.9 % IV SOLN: 2.0000 g | INTRAVENOUS | Status: DC

## 2019-11-15 MED ORDER — TORSEMIDE 20 MG PO TABS
40.0000 mg | ORAL_TABLET | Freq: Every day | ORAL | Status: DC
  Administered 2019-11-16: 40 mg via ORAL
  Filled 2019-11-15 (×2): qty 2

## 2019-11-15 MED ORDER — CALCITRIOL 0.25 MCG PO CAPS
0.5000 ug | ORAL_CAPSULE | Freq: Every day | ORAL | Status: DC
  Administered 2019-11-16 – 2019-11-23 (×8): 0.5 ug via ORAL
  Filled 2019-11-15 (×9): qty 2

## 2019-11-15 MED ORDER — SODIUM CHLORIDE 0.9% FLUSH
3.0000 mL | Freq: Two times a day (BID) | INTRAVENOUS | Status: DC
  Administered 2019-11-15 – 2019-11-23 (×13): 3 mL via INTRAVENOUS

## 2019-11-15 MED ORDER — METOPROLOL TARTRATE 25 MG PO TABS
25.0000 mg | ORAL_TABLET | Freq: Two times a day (BID) | ORAL | Status: DC
  Administered 2019-11-15 – 2019-11-23 (×15): 25 mg via ORAL
  Filled 2019-11-15 (×16): qty 1

## 2019-11-15 MED ORDER — ALBUTEROL SULFATE (2.5 MG/3ML) 0.083% IN NEBU: 2.5000 mg | INHALATION_SOLUTION | RESPIRATORY_TRACT | Status: DC | PRN

## 2019-11-15 MED ORDER — HYDROCODONE-ACETAMINOPHEN 5-325 MG PO TABS: 1.0000 | ORAL_TABLET | ORAL | Status: DC | PRN

## 2019-11-15 MED ORDER — MORPHINE SULFATE (PF) 2 MG/ML IV SOLN: 2.0000 mg | INTRAVENOUS | Status: DC | PRN

## 2019-11-15 MED ORDER — HEPARIN SODIUM (PORCINE) 5000 UNIT/ML IJ SOLN
5000.0000 [IU] | Freq: Three times a day (TID) | INTRAMUSCULAR | Status: DC
  Administered 2019-11-15 – 2019-11-18 (×8): 5000 [IU] via SUBCUTANEOUS
  Filled 2019-11-15 (×8): qty 1

## 2019-11-15 MED ORDER — AMITRIPTYLINE HCL 25 MG PO TABS
25.0000 mg | ORAL_TABLET | Freq: Every day | ORAL | Status: DC
  Administered 2019-11-15 – 2019-11-22 (×8): 25 mg via ORAL
  Filled 2019-11-15 (×8): qty 1

## 2019-11-15 MED ORDER — SODIUM CHLORIDE 0.9% IV SOLUTION
Freq: Once | INTRAVENOUS | Status: DC
  Filled 2019-11-15: qty 250

## 2019-11-15 MED ORDER — ACETAMINOPHEN 650 MG RE SUPP: 650.0000 mg | Freq: Four times a day (QID) | RECTAL | Status: DC | PRN

## 2019-11-15 MED ORDER — SODIUM CHLORIDE 0.9 % IV SOLN
500.0000 mg | Freq: Once | INTRAVENOUS | Status: AC
  Administered 2019-11-15: 500 mg via INTRAVENOUS
  Filled 2019-11-15: qty 500

## 2019-11-15 MED ORDER — SODIUM CHLORIDE 0.9 % IV SOLN
500.0000 mg | INTRAVENOUS | Status: DC
  Filled 2019-11-15: qty 500

## 2019-11-15 MED ORDER — ATORVASTATIN CALCIUM 20 MG PO TABS
40.0000 mg | ORAL_TABLET | Freq: Every day | ORAL | Status: DC
  Administered 2019-11-16 – 2019-11-23 (×8): 40 mg via ORAL
  Filled 2019-11-15 (×8): qty 2

## 2019-11-15 MED ORDER — IPRATROPIUM-ALBUTEROL 0.5-2.5 (3) MG/3ML IN SOLN
3.0000 mL | Freq: Every day | RESPIRATORY_TRACT | Status: DC
  Administered 2019-11-16 – 2019-11-18 (×3): 3 mL via RESPIRATORY_TRACT
  Filled 2019-11-15 (×3): qty 3

## 2019-11-15 MED ORDER — ASPIRIN EC 81 MG PO TBEC
81.0000 mg | DELAYED_RELEASE_TABLET | Freq: Every day | ORAL | Status: DC
  Administered 2019-11-15 – 2019-11-23 (×9): 81 mg via ORAL
  Filled 2019-11-15 (×9): qty 1

## 2019-11-15 MED ORDER — SODIUM CHLORIDE 0.9% FLUSH: 3.0000 mL | INTRAVENOUS | Status: DC | PRN

## 2019-11-15 MED ORDER — LACTULOSE 10 GM/15ML PO SOLN
10.0000 g | Freq: Every day | ORAL | Status: DC
  Administered 2019-11-16 – 2019-11-23 (×8): 10 g via ORAL
  Filled 2019-11-15 (×9): qty 30

## 2019-11-15 MED ORDER — ALBUTEROL SULFATE (2.5 MG/3ML) 0.083% IN NEBU
2.5000 mg | INHALATION_SOLUTION | RESPIRATORY_TRACT | Status: DC | PRN
  Administered 2019-11-19: 2.5 mg via RESPIRATORY_TRACT
  Filled 2019-11-15: qty 3

## 2019-11-15 MED ORDER — ASCORBIC ACID 500 MG PO TABS
1000.0000 mg | ORAL_TABLET | Freq: Two times a day (BID) | ORAL | Status: DC
  Administered 2019-11-15 – 2019-11-23 (×16): 1000 mg via ORAL
  Filled 2019-11-15 (×15): qty 2

## 2019-11-15 MED ORDER — FEBUXOSTAT 40 MG PO TABS
80.0000 mg | ORAL_TABLET | Freq: Every day | ORAL | Status: DC
  Administered 2019-11-16 – 2019-11-23 (×8): 80 mg via ORAL
  Filled 2019-11-15 (×9): qty 2

## 2019-11-15 MED ORDER — FLUTICASONE FUROATE-VILANTEROL 100-25 MCG/INH IN AEPB
1.0000 | INHALATION_SPRAY | Freq: Every day | RESPIRATORY_TRACT | Status: DC
  Administered 2019-11-16 – 2019-11-23 (×8): 1 via RESPIRATORY_TRACT
  Filled 2019-11-15 (×2): qty 28

## 2019-11-15 NOTE — H&P (Signed)
History and Physical    Patrick [place] DXI:[id number] DOB: 11-25-1955 DOA: 11/15/2019   PCP: [name], MD    Patient coming from: Home  Chief Complaint:  Shortness of breath .  HPI: Patrick [place] is a 64 y.o. male with medical history significant for diastolic  CHF, end-stage renal disease currently on peritoneal dialysis, sleep apnea, atrial fibrillation, diabetes who presents with complaints of shortness of breath.  He has been having to use nasal cannula oxygen over the last week, which has progressed since past few months. Occasionally he has had chills patient reports he was started on antibiotics by his PCP recently for possible pneumonia however no significant improvement.    Does report mild cough.  No nausea vomiting or diaphoresis.  No chest pain.  No sick contacts reported.Pt reports hemoptysis since yesterday and cough. Pt was hypoxic at 89% on ra on arrival and is over 90% on 2 L [place].  ED Course:  Blood pressure 122/71, pulse 91, temperature 97.9 F (36.6 C), temperature source Oral, resp. rate 19, height 5\' 11"  (1.803 m), weight (!) 140 kg, SpO2 93 %.  Pt is alert and awake and wife at bedside. Pt is complaint with his providers appts and PD. Labs in ed show anemia with hb of 6.7 and cxray show pna and was started on Rocephin and azithromycin.  Review of Systems: As per HPI otherwise 10 point review of systems negative.    Past Medical History:  Diagnosis Date  . Acute on chronic diastolic CHF (congestive heart failure) ([place]) 06/21/2018  . Acute respiratory failure with hypoxia ([place]) 06/21/2018  . Bell palsy 02/12/2015  . Carpal tunnel syndrome 02/12/2015  . Dependence on nocturnal oxygen therapy    2 LITERS WITH BIPAP  . Depression   . Gastric ulcer   . GERD (gastroesophageal reflux disease)   . Gout   . History of chicken pox   . Multifocal pneumonia 03/04/2019  . Sleep apnea treated with nocturnal BiPAP     Past Surgical History:  Procedure Laterality  Date  . AV FISTULA PLACEMENT Left 11/08/2019   Procedure: ARTERIOVENOUS (AV) FISTULA CREATION (RADIALCEPHALIC);  Surgeon: Algernon Huxley, MD;  Location: [facility] ORS;  Service: Vascular;  Laterality: Left;  . CATARACT EXTRACTION Bilateral 2014 and 2015  . CHOLECYSTECTOMY  04/28/2011   Laproscopic; Dr. Pat Patrick  . DIALYSIS/PERMA CATHETER INSERTION N/A 05/24/2019   Procedure: DIALYSIS/PERMA CATHETER INSERTION;  Surgeon: Algernon Huxley, MD;  Location: [facility];  Service: Cardiovascular;  Laterality: N/A;  . DIALYSIS/PERMA CATHETER INSERTION N/A 10/09/2019   Procedure: DIALYSIS/PERMA CATHETER INSERTION;  Surgeon: Katha Cabal, MD;  Location: [place] [facility];  Service: Cardiovascular;  Laterality: N/A;  . EYE SURGERY    . GALLBLADDER SURGERY    . LASIK Bilateral 2019   medical  . SHOULDER SURGERY Right 2012   Dr. Leanor Kail     reports that he quit smoking about 41 years ago. His smoking use included cigarettes. He has a 15.00 pack-year smoking history. He has never used smokeless tobacco. He reports that he does not drink alcohol and does not use drugs.  Allergies  Allergen Reactions  . Mucinex [Guaifenesin Er] Swelling    Throat swelling, increases heart rate  . Levaquin [Levofloxacin] Palpitations    Family History  Problem Relation Age of Onset  . COPD Mother   . Cancer Mother   . Heart disease Father     Prior to Admission medications  Medication Sig Start Date End Date Taking? Authorizing Provider  albuterol (PROAIR HFA) 108 (90 Base) MCG/ACT inhaler Inhale 2 puffs into the lungs every 6 (six) hours as needed for wheezing or shortness of breath. 11/02/19   [name], MD  amiodarone (PACERONE) 200 MG tablet Take 200 mg by mouth daily. 08/16/19   [provider]  amitriptyline (ELAVIL) 25 MG tablet TAKE 1 TABLET AT BEDTIME Patient taking differently: Take 25 mg by mouth at bedtime.  07/26/19   [name], MD  amLODipine (NORVASC) 5 MG tablet Take 1  tablet (5 mg total) by mouth daily. 11/12/19   [name], MD  Ascorbic Acid (VITAMIN C) 1000 MG tablet Take 1,000 mg by mouth 2 (two) times daily.     [provider]  atorvastatin (LIPITOR) 40 MG tablet TAKE 1 TABLET DAILY 10/15/19   [name], MD  calcitRIOL (ROCALTROL) 0.5 MCG capsule Take 0.5 mcg by mouth daily. 12/29/18   [provider]  cetirizine (ZYRTEC) 10 MG tablet Take 10 mg by mouth daily.     [provider]  doxycycline (VIBRA-TABS) 100 MG tablet Take 1 tablet (100 mg total) by mouth 2 (two) times daily. 11/12/19   [name], MD  ELIQUIS 2.5 MG TABS tablet Take 2.5 mg by mouth 2 (two) times daily.  03/23/19   [provider]  fluticasone furoate-vilanterol (BREO ELLIPTA) 100-25 MCG/INH AEPB Inhale 1 puff into the lungs daily. 12/14/18   Wilhelmina Mcardle, MD  gentamicin cream (GARAMYCIN) 0.1 % Apply 1 application topically daily. 09/22/19   [provider]  hydrALAZINE (APRESOLINE) 25 MG tablet TAKE 1 TABLET THREE TIMES A DAY Patient taking differently: Take 25 mg by mouth 3 (three) times daily.  01/01/19   [name], MD  insulin regular human CONCENTRATED (HUMULIN R) 500 UNIT/ML injection Inject up to 25 units twice a day, or as directed by physician Patient taking differently: Inject 50 Units into the skin 2 (two) times daily with a meal. or as directed by physician 01/05/19   [name], MD  Insulin Syringe-Needle U-100 (BD INSULIN SYRINGE U/F) 31G X 5/16" 1 ML MISC Use for insulin injection up to four times daily for insulin dependent diabetes 07/11/19   [name], MD  ipratropium-albuterol (DUONEB) 0.5-2.5 (3) MG/3ML SOLN INHALE 3 ML BY NEBULIZATION EVERY 4 HOURS AS NEEDED Patient taking differently: Inhale 3 mLs into the lungs daily.  11/30/18   Wilhelmina Mcardle, MD  lactulose (CHRONULAC) 10 GM/15ML solution Take 30 mLs by mouth daily. 09/10/19   [provider]  levothyroxine (SYNTHROID) 100 MCG  tablet TAKE 1 TABLET DAILY Patient taking differently: Take 100 mcg by mouth daily before breakfast.  08/01/19   [name], MD  metoprolol tartrate (LOPRESSOR) 50 MG tablet Take 0.5 tablets (25 mg total) by mouth 2 (two) times daily. 03/23/19   [name], MD  Misc. Devices (PULSE OXIMETER FOR FINGER) MISC 1 Device by Does not apply route daily as needed. 07/10/18   [name], MD  pantoprazole (PROTONIX) 40 MG tablet TAKE 1 TABLET TWICE A DAY Patient taking differently: Take 40 mg by mouth daily.  02/10/19   [name], MD  testosterone cypionate (DEPO-TESTOSTERONE) 200 MG/ML injection Inject 1 mL (200 mg total) into the muscle every 14 (fourteen) days. 03/23/19   [name], MD  torsemide (DEMADEX) 20 MG tablet Take 40 mg by mouth daily. 08/13/19  [provider]  ULORIC 80 MG TABS Take 80 mg by mouth daily.  05/18/17   [provider]    Physical Exam: Vitals:   11/15/19 2022 11/15/19 2116 11/15/19 2143 11/15/19 2145  BP: 125/63 135/67 122/71   Pulse: 72 75  91  Resp: 19  18 19   Temp: 97.9 F (36.6 C)     TempSrc: Oral     SpO2: 93%  90% 93%  Weight:      Height:       Constitutional: NAD, calm, comfortable Vitals:   11/15/19 2022 11/15/19 2116 11/15/19 2143 11/15/19 2145  BP: 125/63 135/67 122/71   Pulse: 72 75  91  Resp: 19  18 19   Temp: 97.9 F (36.6 C)     TempSrc: Oral     SpO2: 93%  90% 93%  Weight:      Height:       Eyes: PERRL, lids and conjunctivae normal ENMT: Mucous membranes are moist. Posterior pharynx clear of any exudate or lesions.Normal dentition.  Neck: normal, supple, no masses, no thyromegaly Respiratory: clear to auscultation bilaterally, no wheezing, no crackles. Normal respiratory effort. No accessory muscle use.  Cardiovascular: Regular rate and rhythm, no murmurs / rubs / gallops. No extremity edema. 2+ pedal pulses. No carotid bruits.  Abdomen: no tenderness, no masses palpated. No hepatosplenomegaly.  Bowel sounds positive, distended, PD catheter in place. Musculoskeletal: no clubbing / cyanosis. No joint deformity upper and lower extremities. Good ROM, no contractures. Normal muscle tone.  Skin: no rashes, lesions, ulcers. No induration Neurologic: CN 2-12 grossly intact. Sensation intact, DTR normal. Strength 5/5 in all 4.  Psychiatric: Normal judgment and insight. Alert and oriented x 3. Normal mood.   Labs on Admission: I have personally reviewed following labs and imaging studies  CBC: Recent Labs  Lab 11/15/19 1504  WBC 8.3  HGB 6.4*  HCT 19.6*  MCV 95.1  PLT 161*   Basic Metabolic Panel: Recent Labs  Lab 11/15/19 1504  NA 134*  K 5.1  CL 93*  CO2 19*  GLUCOSE 295*  BUN 124*  CREATININE 13.21*  CALCIUM 7.9*   GFR: Estimated Creatinine Clearance: 8.3 mL/min (A) (by C-G formula based on SCr of 13.21 mg/dL (H)). Urine analysis:    Component Value Date/Time   COLORURINE STRAW (A) 11/19/2017 1437   APPEARANCEUR CLEAR (A) 11/19/2017 1437   APPEARANCEUR Clear 03/13/2012 1011   LABSPEC 1.008 11/19/2017 1437   LABSPEC 1.015 03/13/2012 1011   PHURINE 5.0 11/19/2017 1437   GLUCOSEU NEGATIVE 11/19/2017 1437   GLUCOSEU Negative 03/13/2012 1011   HGBUR NEGATIVE 11/19/2017 1437   BILIRUBINUR NEGATIVE 11/19/2017 1437   BILIRUBINUR Negative 03/13/2012 1011   KETONESUR NEGATIVE 11/19/2017 1437   PROTEINUR 30 (A) 11/19/2017 1437   NITRITE NEGATIVE 11/19/2017 1437   LEUKOCYTESUR NEGATIVE 11/19/2017 1437   LEUKOCYTESUR Negative 03/13/2012 1011    Recent Results (from the past 240 hour(s))  SARS CORONAVIRUS 2 (TAT 6-24 HRS) Nasopharyngeal Nasopharyngeal Swab     Status: None   Collection Time: 11/07/19 12:29 PM   Specimen: Nasopharyngeal Swab  Result Value Ref Range Status   SARS Coronavirus 2 NEGATIVE NEGATIVE Final    Comment: (NOTE) SARS-CoV-2 target nucleic acids are NOT DETECTED.  The SARS-CoV-2 RNA is generally detectable in upper and lower respiratory  specimens during the acute phase of infection. Negative results do not preclude SARS-CoV-2 infection, do not rule out co-infections with other pathogens, and should not be used as the sole  basis for treatment or other patient management decisions. Negative results must be combined with clinical observations, patient history, and epidemiological information. The expected result is Negative.  Fact Sheet for Patients: SugarRoll.be  Fact Sheet for Healthcare Providers: https://www.woods-mathews.com/  This test is not yet approved or cleared by the [place] FDA and  has been authorized for detection and/or diagnosis of SARS-CoV-2 by FDA under an Emergency Use Authorization (EUA). This EUA will remain  in effect (meaning this test can be used) for the duration of the COVID-19 declaration under Se ction 564(b)(1) of the Act, 21 U.S.C. section 360bbb-3(b)(1), unless the authorization is terminated or revoked sooner.  Performed at [facility], [place] [address], [place], [place] [address]   SARS Coronavirus 2 by RT PCR (hospital order, performed in [facility] hospital lab) Nasopharyngeal Nasopharyngeal Swab     Status: None   Collection Time: 11/15/19  7:14 PM   Specimen: Nasopharyngeal Swab  Result Value Ref Range Status   SARS Coronavirus 2 NEGATIVE NEGATIVE Final    Comment: (NOTE) SARS-CoV-2 target nucleic acids are NOT DETECTED.  The SARS-CoV-2 RNA is generally detectable in upper and lower respiratory specimens during the acute phase of infection. The lowest concentration of SARS-CoV-2 viral copies this assay can detect is 250 copies / mL. A negative result does not preclude SARS-CoV-2 infection and should not be used as the sole basis for treatment or other patient management decisions.  A negative result may occur with improper specimen collection / handling, submission of specimen other than nasopharyngeal swab, presence of  viral mutation(s) within the areas targeted by this assay, and inadequate number of viral copies (<250 copies / mL). A negative result must be combined with clinical observations, patient history, and epidemiological information.  Fact Sheet for Patients:   StrictlyIdeas.no  Fact Sheet for Healthcare Providers: BankingDealers.co.za  This test is not yet approved or  cleared by the [place] FDA and has been authorized for detection and/or diagnosis of SARS-CoV-2 by FDA under an Emergency Use Authorization (EUA).  This EUA will remain in effect (meaning this test can be used) for the duration of the COVID-19 declaration under Section 564(b)(1) of the Act, 21 U.S.C. section 360bbb-3(b)(1), unless the authorization is terminated or revoked sooner.  Performed at [facility], [address] [place], [place] [address]      Radiological Exams on Admission: DG Chest 2 View  Result Date: 11/15/2019 CLINICAL DATA:  Short of breath, pneumonia EXAM: CHEST - 2 VIEW COMPARISON:  11/12/2019 FINDINGS: Frontal and lateral views of the chest demonstrate persistent bilateral perihilar airspace disease, slightly improved on the right since prior study but with significant progression on the left. There is no effusion or pneumothorax. Cardiac silhouette is enlarged but stable. Stable right internal jugular dialysis catheter. No acute bony abnormalities. IMPRESSION: 1. Bilateral perihilar airspace disease, left greater than right. There has been progression in the left since prior study. Differential includes bilateral pneumonia versus asymmetric edema. Electronically Signed   By: Randa Ngo M.D.   On: 11/15/2019 15:37    EKG: Independently reviewed. Sinus rhythm at 72 with st depression in II,II,aVF and twi in v6. This is new compared to prior ekg on July 8th 2021 at [place].   Assessment/Plan Principal Problem:    Respiratory failure with hypoxia (HCC) Active Problems:   CAP (community acquired pneumonia)   Chronic diastolic CHF (congestive heart failure) (HCC)   COPD (chronic obstructive pulmonary disease) (  [place])   ESRD on dialysis ([place])   Diabetes mellitus with neuropathy ([place])   Hypertension   Hypothyroidism   Anemia, iron deficiency  Respiratory failure with hypoxia/ CAP: -supplemental oxygen,IV abx with rocephin and azithromycin / PRN nebs -we will cont pt on torsemide,asa81,amiodarone,metorprolol. Strict I/O. Cardiac enzymes and telemetry and echo.  -Cont albuterol and duoneb and pulse ox with vitals.   ESRD On PD: -Nephrology Dr.Munsoor Lateef contacted by ED MD and consulted.  -Cont PD.  DM II: -SSI/accucheck/ carb consistent /cardaic diet/ last a1c is well controlled at 5.8. -Home regimen with insulin held.   HTN: -Home regimen changed as follows: -Amlodipine held/ hydralazine changed to 10 tid , torsemide continued, metoprolol continued.   Hypothyroidism: -Check TFT's and cont levothyroxine at 100 mcg.   Iron def anemia: -Hb of 6.7 and h/o guaiac pos stools. -T/C/T one unit over 3 hours and pt is receiving epo.  -iv ppi/ guaiac stools.  -held eliquis- restart as appropriate..   DVT prophylaxis: SCD  Code Status: Full Family Communication: Spouse Disposition Plan: Home Consults To be called:  Nephrology. Cardiology. Admission status: Inpatient   Para Skeans MD [facility] If 7PM-7AM, please contact night-coverage [email or web address] Password [facility]  11/15/2019, 10:15 PM

## 2019-11-15 NOTE — Telephone Encounter (Signed)
Sharyn Lull, will you review this message. I called patient back to find out how his breathing was. He states he is still short of breath , but no worse than when he was during the last office visit. Patient says his 02 levels are in the 80's with 3L of oxygen. Please advise if you agree that patient should go to ER due to low 02 levels.

## 2019-11-15 NOTE — Telephone Encounter (Signed)
Patient advised and agrees to go to the ER now. His wife will drive him there.

## 2019-11-15 NOTE — Telephone Encounter (Signed)
Tried calling patient. Left message to call back. OK for [facility] triage to speak with patient.  

## 2019-11-15 NOTE — Telephone Encounter (Signed)
Noted  

## 2019-11-15 NOTE — Telephone Encounter (Signed)
Patient returned call-  O2 sat- in 80's with 3 liters Patient states he is not feeling better ( feels terrible- no improvement). Patient states he is seeing blood in nasal congestion- but not coughing up any blood. Call to office to see if they want to see him today- put on hold for 10 minutes- message sent with high priority. Patient made aware and best number to contact him is [phone]( home number)

## 2019-11-15 NOTE — ED Provider Notes (Signed)
[facility] Emergency Department Provider Note   ____________________________________________    I have reviewed the triage vital signs and the nursing notes.   HISTORY  Chief Complaint Shortness of Breath     HPI Patrick Hurst is a 64 y.o. male with a history of CHF, end-stage renal disease currently on peritoneal dialysis, sleep apnea, atrial fibrillation, diabetes who presents with complaints of shortness of breath.  He has been having to use nasal cannula oxygen over the last week.  Occasionally he has had chills patient reports he was started on antibiotics by his PCP recently for possible pneumonia however no significant improvement.    Does report mild cough.  No nausea vomiting or diaphoresis.  No chest pain.  No sick contacts reported.  Past Medical History:  Diagnosis Date  . Acute on chronic diastolic CHF (congestive heart failure) ([place]) 06/21/2018  . Bell palsy 02/12/2015  . Carpal tunnel syndrome 02/12/2015  . Dependence on nocturnal oxygen therapy    2 LITERS WITH BIPAP  . Depression   . Gastric ulcer   . GERD (gastroesophageal reflux disease)   . Gout   . History of chicken pox   . Multifocal pneumonia 03/04/2019  . Sleep apnea treated with nocturnal BiPAP     Patient Active Problem List   Diagnosis Date Noted  . Dependence on nocturnal oxygen therapy   . Vitamin D deficiency   . ESRD on dialysis ([place]) 06/01/2019  . History of atrial fibrillation   . COPD (chronic obstructive pulmonary disease) ([place]) 12/08/2018  . Diabetic retinopathy of both eyes without macular edema associated with type 2 diabetes mellitus ([place]) 10/20/2018  . Secondary hyperparathyroidism of renal origin ([place]) 06/20/2018  . Chronic diastolic CHF (congestive heart failure) ([place]) 02/27/2018  . Psoriasis (a type of skin inflammation) 10/19/2016  . Primary osteoarthritis of both knees 10/01/2015  . Obesity, Class III, BMI 40-49.9 (morbid obesity) ([place])  10/01/2015  . Splenomegaly 09/19/2015  . ANA positive 07/21/2015  . Hepatic fibrosis 04/16/2015  . Charcot ankle 04/02/2015  . Thrombocytopenia ([place]) 02/14/2015  . Allergic rhinitis 02/12/2015  . Clinical depression 02/12/2015  . Diabetes mellitus with neuropathy ([place]) 02/12/2015  . Erectile dysfunction 02/12/2015  . HLD (hyperlipidemia) 02/12/2015  . Hypertension 02/12/2015  . Hypothyroidism 02/12/2015  . Anemia, iron deficiency 02/12/2015  . Low testosterone 02/12/2015  . Peripheral neuropathy 02/12/2015  . Obstructive sleep apnea 02/12/2015  . Gout 05/05/2012    Past Surgical History:  Procedure Laterality Date  . AV FISTULA PLACEMENT Left 11/08/2019   Procedure: ARTERIOVENOUS (AV) FISTULA CREATION (RADIALCEPHALIC);  Surgeon: Algernon Huxley, MD;  Location: [facility] ORS;  Service: Vascular;  Laterality: Left;  . CATARACT EXTRACTION Bilateral 2014 and 2015  . CHOLECYSTECTOMY  04/28/2011   Laproscopic; Dr. Pat Patrick  . DIALYSIS/PERMA CATHETER INSERTION N/A 05/24/2019   Procedure: DIALYSIS/PERMA CATHETER INSERTION;  Surgeon: Algernon Huxley, MD;  Location: [place] [facility] LAB;  Service: Cardiovascular;  Laterality: N/A;  . DIALYSIS/PERMA CATHETER INSERTION N/A 10/09/2019   Procedure: DIALYSIS/PERMA CATHETER INSERTION;  Surgeon: Katha Cabal, MD;  Location: [place] [facility];  Service: Cardiovascular;  Laterality: N/A;  . EYE SURGERY    . GALLBLADDER SURGERY    . LASIK Bilateral 2019   medical  . SHOULDER SURGERY Right 2012   Dr. Leanor Kail    Prior to Admission medications   Medication Sig Start Date End Date Taking? Authorizing Provider  albuterol (PROAIR HFA) 108 (90 Base) MCG/ACT inhaler Inhale  2 puffs into the lungs every 6 (six) hours as needed for wheezing or shortness of breath. 11/02/19   [name], MD  amiodarone (PACERONE) 200 MG tablet Take 200 mg by mouth daily. 08/16/19   [provider]  amitriptyline (ELAVIL) 25 MG tablet TAKE 1 TABLET AT  BEDTIME Patient taking differently: Take 25 mg by mouth at bedtime.  07/26/19   [name], MD  amLODipine (NORVASC) 5 MG tablet Take 1 tablet (5 mg total) by mouth daily. 11/12/19   [name], MD  Ascorbic Acid (VITAMIN C) 1000 MG tablet Take 1,000 mg by mouth 2 (two) times daily.     [provider]  atorvastatin (LIPITOR) 40 MG tablet TAKE 1 TABLET DAILY 10/15/19   [name], MD  calcitRIOL (ROCALTROL) 0.5 MCG capsule Take 0.5 mcg by mouth daily. 12/29/18   [provider]  cetirizine (ZYRTEC) 10 MG tablet Take 10 mg by mouth daily.     [provider]  doxycycline (VIBRA-TABS) 100 MG tablet Take 1 tablet (100 mg total) by mouth 2 (two) times daily. 11/12/19   [name], MD  ELIQUIS 2.5 MG TABS tablet Take 2.5 mg by mouth 2 (two) times daily.  03/23/19   [provider]  fluticasone furoate-vilanterol (BREO ELLIPTA) 100-25 MCG/INH AEPB Inhale 1 puff into the lungs daily. 12/14/18   Wilhelmina Mcardle, MD  gentamicin cream (GARAMYCIN) 0.1 % Apply 1 application topically daily. 09/22/19   [provider]  hydrALAZINE (APRESOLINE) 25 MG tablet TAKE 1 TABLET THREE TIMES A DAY Patient taking differently: Take 25 mg by mouth 3 (three) times daily.  01/01/19   [name], MD  insulin regular human CONCENTRATED (HUMULIN R) 500 UNIT/ML injection Inject up to 25 units twice a day, or as directed by physician Patient taking differently: Inject 50 Units into the skin 2 (two) times daily with a meal. or as directed by physician 01/05/19   [name], MD  Insulin Syringe-Needle U-100 (BD INSULIN SYRINGE U/F) 31G X 5/16" 1 ML MISC Use for insulin injection up to four times daily for insulin dependent diabetes 07/11/19   [name], MD  ipratropium-albuterol (DUONEB) 0.5-2.5 (3) MG/3ML SOLN INHALE 3 ML BY NEBULIZATION EVERY 4 HOURS AS NEEDED Patient taking differently: Inhale 3 mLs into the lungs daily.  11/30/18   Wilhelmina Mcardle, MD   lactulose (CHRONULAC) 10 GM/15ML solution Take 30 mLs by mouth daily. 09/10/19   [provider]  levothyroxine (SYNTHROID) 100 MCG tablet TAKE 1 TABLET DAILY Patient taking differently: Take 100 mcg by mouth daily before breakfast.  08/01/19   [name], MD  metoprolol tartrate (LOPRESSOR) 50 MG tablet Take 0.5 tablets (25 mg total) by mouth 2 (two) times daily. 03/23/19   [name], MD  Misc. Devices (PULSE OXIMETER FOR FINGER) MISC 1 Device by Does not apply route daily as needed. 07/10/18   [name], MD  pantoprazole (PROTONIX) 40 MG tablet TAKE 1 TABLET TWICE A DAY Patient taking differently: Take 40 mg by mouth daily.  02/10/19   [name], MD  testosterone cypionate (DEPO-TESTOSTERONE) 200 MG/ML injection Inject 1 mL (200 mg total) into the muscle every 14 (fourteen) days. 03/23/19   [name], MD  torsemide (DEMADEX) 20 MG tablet Take 40 mg by mouth daily. 08/13/19   [provider]  ULORIC 80 MG TABS Take 80 mg by mouth daily.  05/18/17   [provider]     Allergies Mucinex [guaifenesin er] and Levaquin [levofloxacin]  Family History  Problem Relation Age of Onset  . COPD Mother   . Cancer Mother   . Heart disease Father     Social History Social History   Tobacco Use  . Smoking status: Former Smoker    Packs/day: 1.00    Years: 15.00    Pack years: 15.00    Types: Cigarettes    Quit date: 05/03/1978    Years since quitting: 41.5  . Smokeless tobacco: Never Used  . Tobacco comment: started smoking at age 3  Vaping Use  . Vaping Use: Never used  Substance Use Topics  . Alcohol use: No    Alcohol/week: 0.0 standard drinks  . Drug use: No    Review of Systems  Constitutional: No fever/chills Eyes: No visual changes.  ENT: No sore throat. Cardiovascular: Denies chest pain. Respiratory: As above Gastrointestinal: No abdominal pain.  Genitourinary: Negative for dysuria. Musculoskeletal: Negative for  back pain. Skin: Negative for rash. Neurological: Negative for headaches   ____________________________________________   PHYSICAL EXAM:  VITAL SIGNS: ED Triage Vitals  Enc Vitals Group     BP 11/15/19 1459 (!) 109/55     Pulse Rate 11/15/19 1459 74     Resp 11/15/19 1459 18     Temp 11/15/19 1459 98.4 F (36.9 C)     Temp Source 11/15/19 1459 Oral     SpO2 11/15/19 1459 (!) 89 %     Weight 11/15/19 1500 (!) 140 kg (308 lb 10.3 oz)     Height 11/15/19 1500 1.803 m (5\' 11" )     Head Circumference --      Peak Flow --      Pain Score 11/15/19 1500 0     Pain Loc --      Pain Edu? --      Excl. in [place]? --     Constitutional: Alert and oriented.   Nose: No congestion/rhinnorhea. Mouth/Throat: Mucous membranes are moist.   Neck:  Painless ROM Cardiovascular: Normal rate, regular rhythm. Grossly normal heart sounds.  Good peripheral circulation. Respiratory: Increased respiratory effort with tachypnea no retractions.  Bilateral rales Gastrointestinal: Soft and nontender. No distention.  Guaiac negative brown stool  Musculoskeletal: No lower extremity tenderness nor edema.  Warm and well perfused Neurologic:  Normal speech and language. No gross focal neurologic deficits are appreciated.  Skin:  Skin is warm, dry and intact. No rash noted. Psychiatric: Mood and affect are normal. Speech and behavior are normal.  ____________________________________________   LABS (all labs ordered are listed, but only abnormal results are displayed)  Labs Reviewed  CBC - Abnormal; Notable for the following components:      Result Value   RBC 2.06 (*)    Hemoglobin 6.4 (*)    HCT 19.6 (*)    Platelets 120 (*)    nRBC 0.6 (*)    All other components within normal limits  BASIC METABOLIC PANEL - Abnormal; Notable for the following components:   Sodium 134 (*)    Chloride 93 (*)    CO2 19 (*)    Glucose, Bld 295 (*)    BUN 124 (*)    Creatinine, Ser 13.21 (*)    Calcium 7.9 (*)     GFR calc non Af Amer 4 (*)    GFR calc Af Amer 4 (*)    Anion gap 22 (*)    All other components within normal  limits  BRAIN NATRIURETIC PEPTIDE - Abnormal; Notable for the following components:   B Natriuretic Peptide 221.3 (*)    All other components within normal limits  CULTURE, BLOOD (ROUTINE X 2)  CULTURE, BLOOD (ROUTINE X 2)  LACTIC ACID, PLASMA  PROCALCITONIN  LACTIC ACID, PLASMA  TYPE AND SCREEN  PREPARE RBC (CROSSMATCH)   ____________________________________________  EKG  ED ECG REPORT I, Lavonia Drafts, the attending physician, personally viewed and interpreted this ECG.  Date: 11/15/2019  Rhythm: normal sinus rhythm QRS Axis: normal Intervals: First-degree AV block ST/T Wave abnormalities: Nonspecific changes Narrative Interpretation: no evidence of acute ischemia  ____________________________________________  RADIOLOGY  Chest x-ray reviewed by me and with bilateral infiltrates, worse on the left. ____________________________________________   PROCEDURES  Procedure(s) performed:yes  .1-3 Lead EKG Interpretation Performed by: Lavonia Drafts, MD Authorized by: Lavonia Drafts, MD     Interpretation: normal     ECG rate assessment: normal     Rhythm: atrial fibrillation     Conduction: normal       Critical Care performed: yes  CRITICAL CARE Performed by: Lavonia Drafts   Total critical care time: 75minutes  Critical care time was exclusive of separately billable procedures and treating other patients.  Critical care was necessary to treat or prevent imminent or life-threatening deterioration.  Critical care was time spent personally by me on the following activities: development of treatment plan with patient and/or surrogate as well as nursing, discussions with consultants, evaluation of patient's response to treatment, examination of patient, obtaining history from patient or surrogate, ordering and performing treatments and interventions,  ordering and review of laboratory studies, ordering and review of radiographic studies, pulse oximetry and re-evaluation of patient's condition.  ____________________________________________   INITIAL IMPRESSION / ASSESSMENT AND PLAN / ED COURSE  Pertinent labs & imaging results that were available during my care of the patient were reviewed by me and considered in my medical decision making (see chart for details).  Patient presents with worsening shortness of breath as noted above.  Treated for pneumonia by PCP however differential also includes CHF/pulmonary edema  Reports compliance with dialysis  Lab work is significant for hemoglobin of 6.4 which is decreased from 1 week ago.  Patient is on Eliquis raising suspicion for GI bleed  Chest x-ray demonstrates bilateral infiltrates, unclear whether this may be pneumonia or CHF.  Normal white blood cell count.  Afebrile.  Pending pro calcitonin, blood cultures and lactic acid to assess further  Procalcitonin is elevated, lactic acid is normal.  Will treat with IV Rocephin, IV azithromycin  Discussed with Dr. Holley Raring of nephrology regarding peritoneal dialysis, he recommends 1 unit PRBC given over 3 hours  Guaiac negative on exam  We will consult the hospitalist service for admission     ____________________________________________   FINAL CLINICAL IMPRESSION(S) / ED DIAGNOSES  Final diagnoses:  Community acquired pneumonia, unspecified laterality  Anemia of chronic disease  Acute respiratory failure with hypoxia ([place])        Note:  This document was prepared using Dragon voice recognition software and may include unintentional dictation errors.   Lavonia Drafts, MD 11/15/19 [phone]

## 2019-11-15 NOTE — ED Notes (Addendum)
Placed on 3L [place]  Oxygen up to 95%

## 2019-11-15 NOTE — Telephone Encounter (Signed)
Patient was seen on Monday with shortness of breath and started on doxy for pneumonia, please check and see if is feeling any better or worse and what is oxygen levels are if has been checking

## 2019-11-15 NOTE — Telephone Encounter (Signed)
Yes advise Emergency Room now- and not to self drive.

## 2019-11-15 NOTE — ED Notes (Signed)
Dressing applied over fistula surgical site on L wrist. Pink no use extremity armband applied to L arm.

## 2019-11-15 NOTE — Anesthesia Postprocedure Evaluation (Signed)
Anesthesia Post Note  Patient: NYSHAWN GOWDY  Procedure(s) Performed: ARTERIOVENOUS (AV) FISTULA CREATION (RADIALCEPHALIC) (Left )  Patient location during evaluation: PACU Anesthesia Type: General Level of consciousness: awake and alert Pain management: pain level controlled Vital Signs Assessment: post-procedure vital signs reviewed and stable Respiratory status: spontaneous breathing, nonlabored ventilation and respiratory function stable Cardiovascular status: blood pressure returned to baseline and stable Postop Assessment: no apparent nausea or vomiting Anesthetic complications: no   No complications documented.   Last Vitals:  Vitals:   11/08/19 1616 11/08/19 1710  BP: (!) 118/52 126/63  Pulse: 62 64  Resp: 16 20  Temp: 36.6 C   SpO2: 96% 94%    Last Pain:  Vitals:   11/08/19 1710  TempSrc:   PainSc: 6                  Cera Rorke T Lavone Neri

## 2019-11-15 NOTE — ED Triage Notes (Signed)
Reports SOB X 2 weeks, saw PCP on Monday and dx with pneumonia, started on ABX. Pt states he wears oxygen at home intermittently as needed as had been wearing 3L [place], mid 80's oxygen saturation.

## 2019-11-16 ENCOUNTER — Other Ambulatory Visit: Payer: Self-pay

## 2019-11-16 ENCOUNTER — Encounter: Payer: Self-pay | Admitting: Internal Medicine

## 2019-11-16 ENCOUNTER — Inpatient Hospital Stay (HOSPITAL_COMMUNITY)
Admit: 2019-11-16 | Discharge: 2019-11-16 | Disposition: A | Discharge status: Home / Self Care | Payer: Commercial Managed Care - PPO | Attending: Internal Medicine | Admitting: Internal Medicine

## 2019-11-16 DIAGNOSIS — J9601 Acute respiratory failure with hypoxia: Secondary | ICD-10-CM

## 2019-11-16 DIAGNOSIS — I5032 Chronic diastolic (congestive) heart failure: Secondary | ICD-10-CM

## 2019-11-16 DIAGNOSIS — I340 Nonrheumatic mitral (valve) insufficiency: Secondary | ICD-10-CM

## 2019-11-16 DIAGNOSIS — J449 Chronic obstructive pulmonary disease, unspecified: Secondary | ICD-10-CM

## 2019-11-16 DIAGNOSIS — I10 Essential (primary) hypertension: Secondary | ICD-10-CM

## 2019-11-16 DIAGNOSIS — E039 Hypothyroidism, unspecified: Secondary | ICD-10-CM

## 2019-11-16 LAB — COMPREHENSIVE METABOLIC PANEL
ALT: 28 U/L (ref 0–44)
AST: 25 U/L (ref 15–41)
Albumin: 3.1 g/dL — ABNORMAL LOW (ref 3.5–5.0)
Alkaline Phosphatase: 207 U/L — ABNORMAL HIGH (ref 38–126)
Anion gap: 20 — ABNORMAL HIGH (ref 5–15)
BUN: 109 mg/dL — ABNORMAL HIGH (ref 8–23)
CO2: 22 mmol/L (ref 22–32)
Calcium: 8.2 mg/dL — ABNORMAL LOW (ref 8.9–10.3)
Chloride: 95 mmol/L — ABNORMAL LOW (ref 98–111)
Creatinine, Ser: 13.08 mg/dL — ABNORMAL HIGH (ref 0.61–1.24)
GFR calc Af Amer: 4 mL/min — ABNORMAL LOW (ref >60)
GFR calc non Af Amer: 4 mL/min — ABNORMAL LOW (ref >60)
Glucose, Bld: 201 mg/dL — ABNORMAL HIGH (ref 70–99)
Potassium: 5.1 mmol/L (ref 3.5–5.1)
Sodium: 137 mmol/L (ref 135–145)
Total Bilirubin: 0.7 mg/dL (ref 0.3–1.2)
Total Protein: 7.6 g/dL (ref 6.5–8.1)

## 2019-11-16 LAB — CBC WITH DIFFERENTIAL/PLATELET
Abs Immature Granulocytes: 0.27 10*3/uL — ABNORMAL HIGH (ref 0.00–0.07)
Basophils Absolute: 0.1 10*3/uL (ref 0.0–0.1)
Basophils Relative: 1 %
Eosinophils Absolute: 0.2 10*3/uL (ref 0.0–0.5)
Eosinophils Relative: 2 %
HCT: 20.6 % — ABNORMAL LOW (ref 39.0–52.0)
Hemoglobin: 6.6 g/dL — ABNORMAL LOW (ref 13.0–17.0)
Immature Granulocytes: 4 %
Lymphocytes Relative: 9 %
Lymphs Abs: 0.7 10*3/uL (ref 0.7–4.0)
MCH: 30.6 pg (ref 26.0–34.0)
MCHC: 32 g/dL (ref 30.0–36.0)
MCV: 95.4 fL (ref 80.0–100.0)
Monocytes Absolute: 1 10*3/uL (ref 0.1–1.0)
Monocytes Relative: 13 %
Neutro Abs: 5.2 10*3/uL (ref 1.7–7.7)
Neutrophils Relative %: 71 %
Platelets: 137 10*3/uL — ABNORMAL LOW (ref 150–400)
RBC: 2.16 MIL/uL — ABNORMAL LOW (ref 4.22–5.81)
RDW: 15.1 % (ref 11.5–15.5)
WBC: 7.3 10*3/uL (ref 4.0–10.5)
nRBC: 0.4 % — ABNORMAL HIGH (ref 0.0–0.2)

## 2019-11-16 LAB — IRON AND TIBC
Iron: 32 ug/dL — ABNORMAL LOW (ref 45–182)
Saturation Ratios: 15 % — ABNORMAL LOW (ref 17.9–39.5)
TIBC: 207 ug/dL — ABNORMAL LOW (ref 250–450)
UIBC: 175 ug/dL

## 2019-11-16 LAB — ECHOCARDIOGRAM COMPLETE
AR max vel: 2.1 cm2
AV Area VTI: 2.29 cm2
AV Area mean vel: 2.07 cm2
AV Mean grad: 8.5 mmHg
AV Peak grad: 16.6 mmHg
Ao pk vel: 2.04 m/s
Area-P 1/2: 3.2 cm2
Height: 71 in
S' Lateral: 3.4 cm
Weight: 4938.3 oz

## 2019-11-16 LAB — FOLATE: Folate: 14.8 ng/mL (ref >5.9)

## 2019-11-16 LAB — TROPONIN I (HIGH SENSITIVITY)
Troponin I (High Sensitivity): 24 ng/L — ABNORMAL HIGH (ref <18)
Troponin I (High Sensitivity): 23 ng/L — ABNORMAL HIGH (ref <18)

## 2019-11-16 LAB — RETICULOCYTES
Immature Retic Fract: 38.8 % — ABNORMAL HIGH (ref 2.3–15.9)
RBC.: 2.1 MIL/uL — ABNORMAL LOW (ref 4.22–5.81)
Retic Count, Absolute: 86.9 10*3/uL (ref 19.0–186.0)
Retic Ct Pct: 4.1 % — ABNORMAL HIGH (ref 0.4–3.1)

## 2019-11-16 LAB — FERRITIN: Ferritin: 627 ng/mL — ABNORMAL HIGH (ref 24–336)

## 2019-11-16 LAB — HIV ANTIBODY (ROUTINE TESTING W REFLEX): HIV Screen 4th Generation wRfx: NONREACTIVE

## 2019-11-16 LAB — VITAMIN B12: Vitamin B-12: 428 pg/mL (ref 180–914)

## 2019-11-16 LAB — HEPATITIS B SURFACE ANTIGEN: Hepatitis B Surface Ag: NONREACTIVE

## 2019-11-16 MED ORDER — DOXYCYCLINE HYCLATE 100 MG PO TABS
100.0000 mg | ORAL_TABLET | Freq: Two times a day (BID) | ORAL | Status: DC
  Administered 2019-11-16 – 2019-11-23 (×14): 100 mg via ORAL
  Filled 2019-11-16 (×14): qty 1

## 2019-11-16 MED ORDER — SODIUM CHLORIDE 0.9% IV SOLUTION: Freq: Once | INTRAVENOUS | Status: AC

## 2019-11-16 MED ORDER — INSULIN ASPART 100 UNIT/ML ~~LOC~~ SOLN
0.0000 [IU] | Freq: Three times a day (TID) | SUBCUTANEOUS | Status: DC
  Administered 2019-11-17: 3 [IU] via SUBCUTANEOUS
  Filled 2019-11-16: qty 1

## 2019-11-16 MED ORDER — FUROSEMIDE 10 MG/ML IJ SOLN
40.0000 mg | Freq: Two times a day (BID) | INTRAMUSCULAR | Status: DC
  Administered 2019-11-16 – 2019-11-20 (×8): 40 mg via INTRAVENOUS
  Filled 2019-11-16 (×8): qty 4

## 2019-11-16 MED ORDER — CHLORHEXIDINE GLUCONATE CLOTH 2 % EX PADS
6.0000 | MEDICATED_PAD | Freq: Every day | CUTANEOUS | Status: DC
  Administered 2019-11-17 – 2019-11-23 (×7): 6 via TOPICAL

## 2019-11-16 NOTE — Progress Notes (Signed)
Pt back to rm 111 from dialysis. 3.5 liters drawn off. tol well  Pt also received 1 unit prbcs while in dialysis.  02 in use 4 l .

## 2019-11-16 NOTE — Progress Notes (Signed)
*  PRELIMINARY RESULTS* Echocardiogram 2D Echocardiogram has been performed.  Sherrie Sport 11/16/2019, 1:02 PM

## 2019-11-16 NOTE — Progress Notes (Signed)
[facility]  ROUNDING NOTE   Subjective:  Patient well-known to [place] as we follow him for outpatient peritoneal dialysis. Regularly followed by my partner Dr. [name]. Comes in with increasing and progressive shortness of breath. He is normally on PD and has good ultrafiltration of 1.4 to 1.6 L using 2.5% dextrose solution. However intermittently he does require hemodialysis for ultrafiltration.  He is currently on oxygen.   Objective:  Vital signs in last 24 hours:  Temp:  [97.8 F (36.6 C)-98.4 F (36.9 C)] 98.4 F (36.9 C) (07/16 0740) Pulse Rate:  [69-91] 75 (07/16 0740) Resp:  [15-22] 22 (07/16 0740) BP: (109-139)/(55-71) 135/59 (07/16 0740) SpO2:  [89 %-99 %] 92 % (07/16 0740) Weight:  [140 kg] 140 kg (07/15 1500)  Weight change:  Filed Weights   11/15/19 1500  Weight: (!) 140 kg    Intake/Output: I/O last 3 completed shifts: In: 818 [Blood:818] Out: -    Intake/Output this shift:  No intake/output data recorded.  Physical Exam: General: No acute distress  Head: Normocephalic, atraumatic. Moist oral mucosal membranes  Eyes: Anicteric  Neck: Supple, trachea midline  Lungs:   Mild basilar rales  Heart: S1S2 no rubs  Abdomen:  Soft, nontender, bowel sounds present  Extremities: 1+ peripheral edema.  Neurologic: Awake, alert, following commands  Skin: No lesions  Access: PD catheter in place, right IJ PermCath in place, developing left upper extremity AV access    Basic Metabolic Panel: Recent Labs  Lab 11/15/19 1504 11/16/19 0159  NA 134* 137  K 5.1 5.1  CL 93* 95*  CO2 19* 22  GLUCOSE 295* 201*  BUN 124* 109*  CREATININE 13.21* 13.08*  CALCIUM 7.9* 8.2*    Liver Function Tests: Recent Labs  Lab 11/16/19 0159  AST 25  ALT 28  ALKPHOS 207*  BILITOT 0.7  PROT 7.6  ALBUMIN 3.1*   No results for input(s): LIPASE, AMYLASE in the last 168 hours. No results for input(s): AMMONIA in the last 168 hours.  CBC: Recent Labs  Lab  11/15/19 1504 11/16/19 0159  WBC 8.3 7.3  NEUTROABS  --  5.2  HGB 6.4* 6.6*  HCT 19.6* 20.6*  MCV 95.1 95.4  PLT 120* 137*    Cardiac Enzymes: No results for input(s): CKTOTAL, CKMB, CKMBINDEX, TROPONINI in the last 168 hours.  BNP: Invalid input(s): POCBNP  CBG: No results for input(s): GLUCAP in the last 168 hours.  Microbiology: Results for orders placed or performed during the hospital encounter of 11/15/19  Blood Culture (routine x 2)     Status: None (Preliminary result)   Collection Time: 11/15/19  5:28 PM   Specimen: BLOOD  Result Value Ref Range Status   Specimen Description BLOOD BLOOD RIGHT FOREARM  Final   Special Requests   Final    BOTTLES DRAWN AEROBIC AND ANAEROBIC Blood Culture adequate volume   Culture   Final    NO GROWTH < 12 HOURS Performed at [facility], [address] [place], [place] [address]    Report Status PENDING  Incomplete  Blood Culture (routine x 2)     Status: None (Preliminary result)   Collection Time: 11/15/19  5:28 PM   Specimen: BLOOD  Result Value Ref Range Status   Specimen Description BLOOD RIGHT ANTECUBITAL  Final   Special Requests   Final    BOTTLES DRAWN AEROBIC AND ANAEROBIC Blood Culture results may not be optimal due to an inadequate volume of blood received in culture bottles  Culture   Final    NO GROWTH < 12 HOURS Performed at [facility], [address] [place], [place] [address]    Report Status PENDING  Incomplete  SARS Coronavirus 2 by RT PCR (hospital order, performed in [facility] hospital lab) Nasopharyngeal Nasopharyngeal Swab     Status: None   Collection Time: 11/15/19  7:14 PM   Specimen: Nasopharyngeal Swab  Result Value Ref Range Status   SARS Coronavirus 2 NEGATIVE NEGATIVE Final    Comment: (NOTE) SARS-CoV-2 target nucleic acids are NOT DETECTED.  The SARS-CoV-2 RNA is generally detectable in upper and lower respiratory specimens during the acute phase of infection. The  lowest concentration of SARS-CoV-2 viral copies this assay can detect is 250 copies / mL. A negative result does not preclude SARS-CoV-2 infection and should not be used as the sole basis for treatment or other patient management decisions.  A negative result may occur with improper specimen collection / handling, submission of specimen other than nasopharyngeal swab, presence of viral mutation(s) within the areas targeted by this assay, and inadequate number of viral copies (<250 copies / mL). A negative result must be combined with clinical observations, patient history, and epidemiological information.  Fact Sheet for Patients:   StrictlyIdeas.no  Fact Sheet for Healthcare Providers: BankingDealers.co.za  This test is not yet approved or  cleared by the [place] FDA and has been authorized for detection and/or diagnosis of SARS-CoV-2 by FDA under an Emergency Use Authorization (EUA).  This EUA will remain in effect (meaning this test can be used) for the duration of the COVID-19 declaration under Section 564(b)(1) of the Act, 21 U.S.C. section 360bbb-3(b)(1), unless the authorization is terminated or revoked sooner.  Performed at [facility], [address] [place], [place] [address]     Coagulation Studies: No results for input(s): LABPROT, INR in the last 72 hours.  Urinalysis: No results for input(s): COLORURINE, LABSPEC, PHURINE, GLUCOSEU, HGBUR, BILIRUBINUR, KETONESUR, PROTEINUR, UROBILINOGEN, NITRITE, LEUKOCYTESUR in the last 72 hours.  Invalid input(s): APPERANCEUR    Imaging: DG Chest 2 View  Result Date: 11/15/2019 CLINICAL DATA:  Short of breath, pneumonia EXAM: CHEST - 2 VIEW COMPARISON:  11/12/2019 FINDINGS: Frontal and lateral views of the chest demonstrate persistent bilateral perihilar airspace disease, slightly improved on the right since prior study but with significant progression on the  left. There is no effusion or pneumothorax. Cardiac silhouette is enlarged but stable. Stable right internal jugular dialysis catheter. No acute bony abnormalities. IMPRESSION: 1. Bilateral perihilar airspace disease, left greater than right. There has been progression in the left since prior study. Differential includes bilateral pneumonia versus asymmetric edema. Electronically Signed   By: Randa Ngo M.D.   On: 11/15/2019 15:37     Medications:   . sodium chloride    . azithromycin    . cefTRIAXone (ROCEPHIN)  IV     . sodium chloride   Intravenous Once  . amiodarone  200 mg Oral Daily  . amitriptyline  25 mg Oral QHS  . vitamin C  1,000 mg Oral BID  . aspirin EC  81 mg Oral Daily  . atorvastatin  40 mg Oral Daily  . calcitRIOL  0.5 mcg Oral Daily  . febuxostat  80 mg Oral Daily  . fluticasone furoate-vilanterol  1 puff Inhalation Daily  . heparin  5,000 Units Subcutaneous Q8H  . hydrALAZINE  10 mg Oral TID  . ipratropium-albuterol  3 mL Inhalation Daily  . lactulose  10 g Oral Daily  . levothyroxine  100 mcg Oral Q0600  . loratadine  10 mg Oral Daily  . metoprolol tartrate  25 mg Oral BID  . sodium chloride flush  3 mL Intravenous Q12H  . torsemide  40 mg Oral Daily   sodium chloride, acetaminophen **OR** acetaminophen, albuterol, albuterol, HYDROcodone-acetaminophen, morphine injection, sodium chloride flush  Assessment/ Plan:  64 y.o. male with past medical history of ESRD on PD with intermittent use of ultrafiltration for volume removal, acute on chronic diastolic heart failure, Bell's palsy, carpal tunnel syndrome, depression, GERD, gout, anemia of chronic kidney disease, secondary hyperparathyroidism, history of volume overload who presents now with progressive shortness of breath.  1.  ESRD on PD with intermittent use of hemodialysis for ultrafiltration/volume overload.  Suspect that he likely does have some volume overload now.  We will plan for hemodialysis treatment  today.  Ultrafiltration target 3 kg.  Can resume PD tomorrow based on response to treatment today.  Does not appear to have ultrafiltration failure with PD as he reports that even with 2.5% dextrose solution he normally has 1.4 to 1.6 L of ultrafiltration.  2.  Anemia of chronic kidney disease.  Hemoglobin significantly worse now at 6.6.  Recommend blood transfusion but defer to primary team.  This may be playing some role in the shortness of breath.  3.  Secondary hyperparathyroidism.  Maintain the patient on calcitriol for now.  Evaluate bone metabolism parameters.   LOS: 1 Patrick Hurst 7/16/202111:43 AM

## 2019-11-16 NOTE — Progress Notes (Signed)
Report called to [facility]  for patient being transferred to room 111.

## 2019-11-16 NOTE — Progress Notes (Signed)
PROGRESS NOTE    Patrick Hurst  YPP:[id number] DOB: 02/01/1956 DOA: 11/15/2019 PCP: [name], MD    Chief Complaint  Patient presents with  . Shortness of Breath    Brief Narrative:  64 year old with prior history of diastolic heart failure, end-stage renal disease on PD, does require intermittent HD, paroxysmal atrial fibrillation, anemia of chronic disease gallop, depression, Bell's palsy, carpal tunnel syndrome upper lipidemia, hypertension, DM, COPD, OHS, OSA on BIPAP at night, Hypothyroidism, presents to ED, with SOB.    Assessment & Plan:   Principal Problem:   Respiratory failure with hypoxia (HCC) Active Problems:   Diabetes mellitus with neuropathy (HCC)   Hypertension   Hypothyroidism   Anemia, iron deficiency   Chronic diastolic CHF (congestive heart failure) (HCC)   COPD (chronic obstructive pulmonary disease) (HCC)   ESRD on dialysis ([place])   CAP (community acquired pneumonia)    Acute respiratory failure with hypoxia probably secondary to combination of bilateral pneumonia  And fluid overload from inadequate peritoneal dialysis and acute on chronic diastolic heart failure.   Patient currently requiring up to 4 L of nasal cannula oxygen to keep sats greater than 90%. He was started on IV Rocephin and Zithromax for the community-acquired pneumonia Follow-up blood cultures ordered, sputum cultures. Started the patient on IV lasix 40 mg BID, repeat echocardiogram, strict intake and output and daily weights.   Nephrology consulted for HD, plan for HD today for fluid overload.    Anemia of chronic disease:  Hemoglobin at baseline between 8 to 9. On admission hemoglobin around 6.6.  No h/o rectal bleeding or melena.  Stool for occult blood ordered.  Anemia panel ordered for further evaluation.    ESRD on PD and on HD intermittently.  Appreciate nephrology recommendations.  Plan for HD today.     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 CBG (last 3)  No results for input(s): GLUCAP in the last 72 hours.  A1c is 5.8    Essential hypertension:  Well controlled.    OHS and OSA on BIPAP at night    Body mass index is 43.05 kg/m. Morbid obesity Poor prognostic factor.     Hypothyroidism:  Resume synthroid.     Hyperlipidemia:  Resume statin.   PAF:  Rate controlled with amiodarone.  Eliquis on hold for hemoglobin <7.    COPD: No wheezing heard today.  Resume bronchodilators    DVT prophylaxis: (Heparin subcu  code Status: Full code Family Communication: None at bedside Disposition:   Status is: Inpatient  Remains inpatient appropriate because:Hemodynamically unstable and IV treatments appropriate due to intensity of illness or inability to take PO   Dispo: The patient is from: Home              Anticipated d/c is to: Pending              Anticipated d/c date is: 2 days              Patient currently is not medically stable to d/c.       Consultants:   Nephrology  Procedures: Hemodialysis  Antimicrobials: Rocephin and Zithromax since admission   Subjective: Patient reports being congested, short of breath on ambulation  Objective: Vitals:   11/15/19 2350 11/16/19 0145 11/16/19 0525 11/16/19 0740  BP: (!) 128/57 139/61 (!) 127/56 (!) 135/59  Pulse:  69 71 75  Resp:  18 17 (!) 22  Temp: 98 F (36.7 C) 98.1 F (36.7  C) 98.1 F (36.7 C) 98.4 F (36.9 C)  TempSrc: Oral   Oral  SpO2: 93% 94% 92% 92%  Weight:      Height:        Intake/Output Summary (Last 24 hours) at 11/16/2019 1152 Last data filed at 11/15/2019 2350 Gross per 24 hour  Intake 818 ml  Output --  Net 818 ml   Filed Weights   11/15/19 1500  Weight: (!) 140 kg    Examination:  General exam: mild to mod distress from sob on 4 lit of [place] oxygen.  Respiratory system: Diminished air entry at bases, tachypnea present, no wheezing or Cardiovascular system: S1-S2 heard regular rate  rhythm pedal edema present Gastrointestinal system: Abdomen is nondistended, soft and nontender. Normal bowel sounds heard.  PD catheter in place Central nervous system: Alert and oriented. No focal neurological deficits. Extremities: No cyanosis or clubbing, left upper extremity AV fistula Skin: No rashes, lesions or ulcers Psychiatry:  Mood & affect appropriate.     Data Reviewed: I have personally reviewed following labs and imaging studies  CBC: Recent Labs  Lab 11/15/19 1504 11/16/19 0159  WBC 8.3 7.3  NEUTROABS  --  5.2  HGB 6.4* 6.6*  HCT 19.6* 20.6*  MCV 95.1 95.4  PLT 120* 137*    Basic Metabolic Panel: Recent Labs  Lab 11/15/19 1504 11/16/19 0159  NA 134* 137  K 5.1 5.1  CL 93* 95*  CO2 19* 22  GLUCOSE 295* 201*  BUN 124* 109*  CREATININE 13.21* 13.08*  CALCIUM 7.9* 8.2*    GFR: Estimated Creatinine Clearance: 8.4 mL/min (A) (by C-G formula based on SCr of 13.08 mg/dL (H)).  Liver Function Tests: Recent Labs  Lab 11/16/19 0159  AST 25  ALT 28  ALKPHOS 207*  BILITOT 0.7  PROT 7.6  ALBUMIN 3.1*    CBG: No results for input(s): GLUCAP in the last 168 hours.   Recent Results (from the past 240 hour(s))  SARS CORONAVIRUS 2 (TAT 6-24 HRS) Nasopharyngeal Nasopharyngeal Swab     Status: None   Collection Time: 11/07/19 12:29 PM   Specimen: Nasopharyngeal Swab  Result Value Ref Range Status   SARS Coronavirus 2 NEGATIVE NEGATIVE Final    Comment: (NOTE) SARS-CoV-2 target nucleic acids are NOT DETECTED.  The SARS-CoV-2 RNA is generally detectable in upper and lower respiratory specimens during the acute phase of infection. Negative results do not preclude SARS-CoV-2 infection, do not rule out co-infections with other pathogens, and should not be used as the sole basis for treatment or other patient management decisions. Negative results must be combined with clinical observations, patient history, and epidemiological information. The  expected result is Negative.  Fact Sheet for Patients: SugarRoll.be  Fact Sheet for Healthcare Providers: https://www.woods-mathews.com/  This test is not yet approved or cleared by the [place] FDA and  has been authorized for detection and/or diagnosis of SARS-CoV-2 by FDA under an Emergency Use Authorization (EUA). This EUA will remain  in effect (meaning this test can be used) for the duration of the COVID-19 declaration under Se ction 564(b)(1) of the Act, 21 U.S.C. section 360bbb-3(b)(1), unless the authorization is terminated or revoked sooner.  Performed at [facility], [place] [address] [place], [place] [address]   Blood Culture (routine x 2)     Status: None (Preliminary result)   Collection Time: 11/15/19  5:28 PM   Specimen: BLOOD  Result Value Ref Range Status   Specimen Description BLOOD BLOOD RIGHT  FOREARM  Final   Special Requests   Final    BOTTLES DRAWN AEROBIC AND ANAEROBIC Blood Culture adequate volume   Culture   Final    NO GROWTH < 12 HOURS Performed at [facility], [address] [place], [place] [address]    Report Status PENDING  Incomplete  Blood Culture (routine x 2)     Status: None (Preliminary result)   Collection Time: 11/15/19  5:28 PM   Specimen: BLOOD  Result Value Ref Range Status   Specimen Description BLOOD RIGHT ANTECUBITAL  Final   Special Requests   Final    BOTTLES DRAWN AEROBIC AND ANAEROBIC Blood Culture results may not be optimal due to an inadequate volume of blood received in culture bottles   Culture   Final    NO GROWTH < 12 HOURS Performed at [facility], [address] [place], [place] [address]    Report Status PENDING  Incomplete  SARS Coronavirus 2 by RT PCR (hospital order, performed in [place] hospital lab) Nasopharyngeal Nasopharyngeal Swab     Status: None   Collection Time: 11/15/19  7:14 PM   Specimen: Nasopharyngeal Swab  Result  Value Ref Range Status   SARS Coronavirus 2 NEGATIVE NEGATIVE Final    Comment: (NOTE) SARS-CoV-2 target nucleic acids are NOT DETECTED.  The SARS-CoV-2 RNA is generally detectable in upper and lower respiratory specimens during the acute phase of infection. The lowest concentration of SARS-CoV-2 viral copies this assay can detect is 250 copies / mL. A negative result does not preclude SARS-CoV-2 infection and should not be used as the sole basis for treatment or other patient management decisions.  A negative result may occur with improper specimen collection / handling, submission of specimen other than nasopharyngeal swab, presence of viral mutation(s) within the areas targeted by this assay, and inadequate number of viral copies (<250 copies / mL). A negative result must be combined with clinical observations, patient history, and epidemiological information.  Fact Sheet for Patients:   StrictlyIdeas.no  Fact Sheet for Healthcare Providers: BankingDealers.co.za  This test is not yet approved or  cleared by the [place] FDA and has been authorized for detection and/or diagnosis of SARS-CoV-2 by FDA under an Emergency Use Authorization (EUA).  This EUA will remain in effect (meaning this test can be used) for the duration of the COVID-19 declaration under Section 564(b)(1) of the Act, 21 U.S.C. section 360bbb-3(b)(1), unless the authorization is terminated or revoked sooner.  Performed at [facility], [address] [place], [place] [address]          Radiology Studies: DG Chest 2 View  Result Date: 11/15/2019 CLINICAL DATA:  Short of breath, pneumonia EXAM: CHEST - 2 VIEW COMPARISON:  11/12/2019 FINDINGS: Frontal and lateral views of the chest demonstrate persistent bilateral perihilar airspace disease, slightly improved on the right since prior study but with significant progression on the left. There is no  effusion or pneumothorax. Cardiac silhouette is enlarged but stable. Stable right internal jugular dialysis catheter. No acute bony abnormalities. IMPRESSION: 1. Bilateral perihilar airspace disease, left greater than right. There has been progression in the left since prior study. Differential includes bilateral pneumonia versus asymmetric edema. Electronically Signed   By: Randa Ngo M.D.   On: 11/15/2019 15:37        Scheduled Meds: . sodium chloride   Intravenous Once  . sodium chloride   Intravenous Once  . amiodarone  200 mg Oral Daily  .  amitriptyline  25 mg Oral QHS  . vitamin C  1,000 mg Oral BID  . aspirin EC  81 mg Oral Daily  . atorvastatin  40 mg Oral Daily  . calcitRIOL  0.5 mcg Oral Daily  . Chlorhexidine Gluconate Cloth  6 each Topical Q0600  . febuxostat  80 mg Oral Daily  . fluticasone furoate-vilanterol  1 puff Inhalation Daily  . heparin  5,000 Units Subcutaneous Q8H  . hydrALAZINE  10 mg Oral TID  . ipratropium-albuterol  3 mL Inhalation Daily  . lactulose  10 g Oral Daily  . levothyroxine  100 mcg Oral Q0600  . loratadine  10 mg Oral Daily  . metoprolol tartrate  25 mg Oral BID  . sodium chloride flush  3 mL Intravenous Q12H  . torsemide  40 mg Oral Daily   Continuous Infusions: . sodium chloride    . azithromycin    . cefTRIAXone (ROCEPHIN)  IV       LOS: 1 day        Hosie Poisson, MD [facility]   To contact the attending provider between 7A-7P or the covering provider during after hours 7P-7A, please log into the web site [email or web address] and access using universal [place] password for that web site. If you do not have the password, please call the hospital operator.  11/16/2019, 11:52 AM

## 2019-11-16 NOTE — Progress Notes (Signed)
Peritoneal Dialysis patient known at [facility]. Please contact me with dialysis placement concerns.   Elvera Bicker  Dialysis Coordinator [phone]

## 2019-11-17 LAB — BASIC METABOLIC PANEL
Anion gap: 16 — ABNORMAL HIGH (ref 5–15)
BUN: 77 mg/dL — ABNORMAL HIGH (ref 8–23)
CO2: 27 mmol/L (ref 22–32)
Calcium: 8.4 mg/dL — ABNORMAL LOW (ref 8.9–10.3)
Chloride: 95 mmol/L — ABNORMAL LOW (ref 98–111)
Creatinine, Ser: 8.22 mg/dL — ABNORMAL HIGH (ref 0.61–1.24)
GFR calc Af Amer: 7 mL/min — ABNORMAL LOW (ref >60)
GFR calc non Af Amer: 6 mL/min — ABNORMAL LOW (ref >60)
Glucose, Bld: 139 mg/dL — ABNORMAL HIGH (ref 70–99)
Potassium: 4.2 mmol/L (ref 3.5–5.1)
Sodium: 138 mmol/L (ref 135–145)

## 2019-11-17 LAB — TYPE AND SCREEN
ABO/RH(D): A POS
Antibody Screen: NEGATIVE
Unit division: 0
Unit division: 0

## 2019-11-17 LAB — BPAM RBC
Blood Product Expiration Date: 202108092359
Blood Product Expiration Date: 202108132359
ISSUE DATE / TIME: 202107152000
ISSUE DATE / TIME: 202107161616
Unit Type and Rh: 6200
Unit Type and Rh: 6200

## 2019-11-17 LAB — PHOSPHORUS: Phosphorus: 9.6 mg/dL — ABNORMAL HIGH (ref 2.5–4.6)

## 2019-11-17 LAB — HEPATITIS B SURFACE ANTIBODY, QUANTITATIVE: Hep B S AB Quant (Post): 91.9 m[IU]/mL (ref >9.9)

## 2019-11-17 LAB — GLUCOSE, CAPILLARY
Glucose-Capillary: 129 mg/dL — ABNORMAL HIGH (ref 70–99)
Glucose-Capillary: 259 mg/dL — ABNORMAL HIGH (ref 70–99)
Glucose-Capillary: 170 mg/dL — ABNORMAL HIGH (ref 70–99)

## 2019-11-17 LAB — CBC
HCT: 21.6 % — ABNORMAL LOW (ref 39.0–52.0)
Hemoglobin: 7.3 g/dL — ABNORMAL LOW (ref 13.0–17.0)
MCH: 30.7 pg (ref 26.0–34.0)
MCHC: 33.8 g/dL (ref 30.0–36.0)
MCV: 90.8 fL (ref 80.0–100.0)
Platelets: 138 10*3/uL — ABNORMAL LOW (ref 150–400)
RBC: 2.38 MIL/uL — ABNORMAL LOW (ref 4.22–5.81)
RDW: 16.1 % — ABNORMAL HIGH (ref 11.5–15.5)
WBC: 7.3 10*3/uL (ref 4.0–10.5)
nRBC: 0.5 % — ABNORMAL HIGH (ref 0.0–0.2)

## 2019-11-17 MED ORDER — FERROUS SULFATE 325 (65 FE) MG PO TABS: 325.0000 mg | ORAL_TABLET | Freq: Two times a day (BID) | ORAL | Status: DC

## 2019-11-17 MED ORDER — PENTAFLUOROPROP-TETRAFLUOROETH EX AERO: 1.0000 "application " | INHALATION_SPRAY | CUTANEOUS | Status: DC | PRN

## 2019-11-17 MED ORDER — ALTEPLASE 2 MG IJ SOLR: 2.0000 mg | Freq: Once | INTRAMUSCULAR | Status: DC | PRN

## 2019-11-17 MED ORDER — LIDOCAINE HCL (PF) 1 % IJ SOLN
5.0000 mL | INTRAMUSCULAR | Status: DC | PRN
  Filled 2019-11-17: qty 5

## 2019-11-17 MED ORDER — DELFLEX-LC/2.5% DEXTROSE 394 MOSM/L IP SOLN
INTRAPERITONEAL | Status: DC
  Administered 2019-11-17 – 2019-11-20 (×4): 12 L via INTRAPERITONEAL
  Filled 2019-11-17: qty 3000

## 2019-11-17 MED ORDER — HEPARIN SODIUM (PORCINE) 1000 UNIT/ML DIALYSIS: 1000.0000 [IU] | INTRAMUSCULAR | Status: DC | PRN

## 2019-11-17 MED ORDER — INSULIN ASPART 100 UNIT/ML ~~LOC~~ SOLN
SUBCUTANEOUS | Status: AC
  Filled 2019-11-17: qty 1

## 2019-11-17 MED ORDER — GENTAMICIN SULFATE 0.1 % EX CREA
1.0000 "application " | TOPICAL_CREAM | Freq: Every day | CUTANEOUS | Status: DC
  Administered 2019-11-17 – 2019-11-22 (×8): 1 via TOPICAL
  Filled 2019-11-17: qty 15

## 2019-11-17 MED ORDER — LIDOCAINE-PRILOCAINE 2.5-2.5 % EX CREA: 1.0000 "application " | TOPICAL_CREAM | CUTANEOUS | Status: DC | PRN

## 2019-11-17 MED ORDER — SODIUM CHLORIDE 0.9 % IV SOLN: 100.0000 mL | INTRAVENOUS | Status: DC | PRN

## 2019-11-17 MED ORDER — INSULIN ASPART 100 UNIT/ML ~~LOC~~ SOLN
0.0000 [IU] | Freq: Three times a day (TID) | SUBCUTANEOUS | Status: DC
  Administered 2019-11-17: 17:00:00 1 [IU] via SUBCUTANEOUS
  Administered 2019-11-18: 13:00:00 2 [IU] via SUBCUTANEOUS
  Administered 2019-11-18: 1 [IU] via SUBCUTANEOUS
  Administered 2019-11-19 (×2): 2 [IU] via SUBCUTANEOUS
  Administered 2019-11-19: 17:00:00 3 [IU] via SUBCUTANEOUS
  Administered 2019-11-20: 1 [IU] via SUBCUTANEOUS
  Administered 2019-11-20 (×2): 2 [IU] via SUBCUTANEOUS
  Administered 2019-11-21 (×2): 1 [IU] via SUBCUTANEOUS
  Administered 2019-11-21 – 2019-11-22 (×2): 2 [IU] via SUBCUTANEOUS
  Administered 2019-11-22: 18:00:00 4 [IU] via SUBCUTANEOUS
  Administered 2019-11-22: 13:00:00 2 [IU] via SUBCUTANEOUS
  Administered 2019-11-23: 09:00:00 1 [IU] via SUBCUTANEOUS
  Administered 2019-11-23: 3 [IU] via SUBCUTANEOUS
  Filled 2019-11-17 (×16): qty 1

## 2019-11-17 MED ORDER — TRAZODONE HCL 50 MG PO TABS
50.0000 mg | ORAL_TABLET | Freq: Every evening | ORAL | Status: DC | PRN
  Administered 2019-11-17: 50 mg via ORAL
  Filled 2019-11-17: qty 1

## 2019-11-17 NOTE — Progress Notes (Signed)
PROGRESS NOTE    Patrick Hurst  ZOX:[id number] DOB: March 31, 1956 DOA: 11/15/2019 PCP: [name], MD    Chief Complaint  Patient presents with  . Shortness of Breath    Brief Narrative:  64 year old with prior history of diastolic heart failure, end-stage renal disease on PD, does require intermittent HD, paroxysmal atrial fibrillation, anemia of chronic disease gallop, depression, Bell's palsy, carpal tunnel syndrome upper lipidemia, hypertension, DM, COPD, OHS, OSA on BIPAP at night, Hypothyroidism, presents to ED, with SOB.    Assessment & Plan:   Principal Problem:   Respiratory failure with hypoxia (HCC) Active Problems:   Diabetes mellitus with neuropathy (HCC)   Hypertension   Hypothyroidism   Anemia, iron deficiency   Chronic diastolic CHF (congestive heart failure) (HCC)   COPD (chronic obstructive pulmonary disease) (HCC)   ESRD on dialysis ([place])   CAP (community acquired pneumonia)    Acute respiratory failure with hypoxia probably secondary to combination of bilateral pneumonia  And fluid overload from inadequate peritoneal dialysis and acute on chronic diastolic heart failure.   Patient currently requiring up to 4 L of nasal cannula oxygen to keep sats greater than 90%. He was started on IV Rocephin and Zithromax for the community-acquired pneumonia Follow-up blood cultures ordered, sputum cultures. Started the patient on IV lasix 40 mg BID ,  Urine output was not measured.  Strict intake and output and daily weights.  Echocardiogram showed persevered left ventricular ejection fracture and grade 2 diastolic dysfunction.   Nephrology consulted for HD, underwent  HD session on 11/16/19 and 3.5 lit of fluid removed.   Anemia of chronic disease:  Hemoglobin at baseline between 8 to 9. On admission hemoglobin around 6.6. s/p 1 unit of prbc transfusion ordered.  No h/o rectal bleeding or melena.  Stool for occult blood ordered.  Anemia panel ordered for  further evaluation showed iron level of 32, ferritin of 627, folate of 14.8 and vitamin B 12 of  428.  Iron supplementation on discharge.    ESRD on PD and on HD intermittently.  Appreciate nephrology recommendations.  Plan for PD tonight.     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 CBG (last 3)  Recent Labs    11/17/19 0849  GLUCAP 129*    A1c is 5.8 Restart home meds and SSI.     Essential hypertension:  Well controlled.    OHS and OSA on BIPAP at night    Body mass index is 43.05 kg/m. Morbid obesity Poor prognostic factor.     Hypothyroidism:  Resume synthroid.     Hyperlipidemia:  Resume statin.   PAF:  Rate controlled with amiodarone.  Eliquis on hold for hemoglobin <7.    COPD: No wheezing heard today.  Resume bronchodilators    DVT prophylaxis: (Heparin subcu  code Status: Full code Family Communication: None at bedside Disposition:   Status is: Inpatient  Remains inpatient appropriate because:Hemodynamically unstable and IV treatments appropriate due to intensity of illness or inability to take PO   Dispo: The patient is from: Home              Anticipated d/c is to: Pending              Anticipated d/c date is: 2 days              Patient currently is not medically stable to d/c.       Consultants:   Nephrology  Procedures: Hemodialysis  Antimicrobials:  Rocephin and Zithromax since admission   Subjective: Pt reports breathing has improved, appears to bein good spirits. No chest pain , no nausea, vomiting or abd pain.   Objective: Vitals:   11/16/19 2357 11/17/19 0536 11/17/19 0810 11/17/19 0848  BP: 137/60 127/64  (!) 125/57  Pulse: 77 68  71  Resp: 16 (!) 21  16  Temp: 99.5 F (37.5 C) 98.1 F (36.7 C)  98.8 F (37.1 C)  TempSrc: Oral Oral    SpO2: 92% 93% 92% 93%  Weight:      Height:        Intake/Output Summary (Last 24 hours) at 11/17/2019 0941 Last data filed at 11/17/2019  0600 Gross per 24 hour  Intake 851.17 ml  Output 3500 ml  Net -2648.83 ml   Filed Weights   11/15/19 1500  Weight: (!) 140 kg    Examination:  General exam: alert and comfortable. Not in distress.  Respiratory system: Air entry fair bilateral, no wheezing or rhonchi. Tachypnea present.  Cardiovascular system: S1s2 , RRR, no JVD, pedal edema present.  Gastrointestinal system: abd is soft, non tender PD catheter site intact, bowel sounds wnl.  Central nervous system: Alert and oriented, grossly non focal.  Extremities: No cyanosis or clubbing, , left upper extremity AV fistula Skin: No rashes.  Psychiatry:  Mood is appropriate.     Data Reviewed: I have personally reviewed following labs and imaging studies  CBC: Recent Labs  Lab 11/15/19 1504 11/16/19 0159 11/17/19 0315  WBC 8.3 7.3 7.3  NEUTROABS  --  5.2  --   HGB 6.4* 6.6* 7.3*  HCT 19.6* 20.6* 21.6*  MCV 95.1 95.4 90.8  PLT 120* 137* 138*    Basic Metabolic Panel: Recent Labs  Lab 11/15/19 1504 11/16/19 0159 11/17/19 0315  NA 134* 137 138  K 5.1 5.1 4.2  CL 93* 95* 95*  CO2 19* 22 27  GLUCOSE 295* 201* 139*  BUN 124* 109* 77*  CREATININE 13.21* 13.08* 8.22*  CALCIUM 7.9* 8.2* 8.4*    GFR: Estimated Creatinine Clearance: 13.3 mL/min (A) (by C-G formula based on SCr of 8.22 mg/dL (H)).  Liver Function Tests: Recent Labs  Lab 11/16/19 0159  AST 25  ALT 28  ALKPHOS 207*  BILITOT 0.7  PROT 7.6  ALBUMIN 3.1*    CBG: Recent Labs  Lab 11/17/19 0849  GLUCAP 129*     Recent Results (from the past 240 hour(s))  SARS CORONAVIRUS 2 (TAT 6-24 HRS) Nasopharyngeal Nasopharyngeal Swab     Status: None   Collection Time: 11/07/19 12:29 PM   Specimen: Nasopharyngeal Swab  Result Value Ref Range Status   SARS Coronavirus 2 NEGATIVE NEGATIVE Final    Comment: (NOTE) SARS-CoV-2 target nucleic acids are NOT DETECTED.  The SARS-CoV-2 RNA is generally detectable in upper and lower respiratory  specimens during the acute phase of infection. Negative results do not preclude SARS-CoV-2 infection, do not rule out co-infections with other pathogens, and should not be used as the sole basis for treatment or other patient management decisions. Negative results must be combined with clinical observations, patient history, and epidemiological information. The expected result is Negative.  Fact Sheet for Patients: SugarRoll.be  Fact Sheet for Healthcare Providers: https://www.woods-mathews.com/  This test is not yet approved or cleared by the [place] FDA and  has been authorized for detection and/or diagnosis of SARS-CoV-2 by FDA under an Emergency Use Authorization (EUA). This EUA will remain  in effect (meaning this test can  be used) for the duration of the COVID-19 declaration under Se ction 564(b)(1) of the Act, 21 U.S.C. section 360bbb-3(b)(1), unless the authorization is terminated or revoked sooner.  Performed at [facility], [place] [address], [place], [place] [address]   Blood Culture (routine x 2)     Status: None (Preliminary result)   Collection Time: 11/15/19  5:28 PM   Specimen: BLOOD  Result Value Ref Range Status   Specimen Description BLOOD BLOOD RIGHT FOREARM  Final   Special Requests   Final    BOTTLES DRAWN AEROBIC AND ANAEROBIC Blood Culture adequate volume   Culture   Final    NO GROWTH 2 DAYS Performed at [facility], [address], [place], [place] [address]    Report Status PENDING  Incomplete  Blood Culture (routine x 2)     Status: None (Preliminary result)   Collection Time: 11/15/19  5:28 PM   Specimen: BLOOD  Result Value Ref Range Status   Specimen Description BLOOD RIGHT ANTECUBITAL  Final   Special Requests   Final    BOTTLES DRAWN AEROBIC AND ANAEROBIC Blood Culture results may not be optimal due to an inadequate volume of blood received in culture bottles   Culture   Final     NO GROWTH 2 DAYS Performed at [facility], [address], [place], [place] [address]    Report Status PENDING  Incomplete  SARS Coronavirus 2 by RT PCR (hospital order, performed in [facility] hospital lab) Nasopharyngeal Nasopharyngeal Swab     Status: None   Collection Time: 11/15/19  7:14 PM   Specimen: Nasopharyngeal Swab  Result Value Ref Range Status   SARS Coronavirus 2 NEGATIVE NEGATIVE Final    Comment: (NOTE) SARS-CoV-2 target nucleic acids are NOT DETECTED.  The SARS-CoV-2 RNA is generally detectable in upper and lower respiratory specimens during the acute phase of infection. The lowest concentration of SARS-CoV-2 viral copies this assay can detect is 250 copies / mL. A negative result does not preclude SARS-CoV-2 infection and should not be used as the sole basis for treatment or other patient management decisions.  A negative result may occur with improper specimen collection / handling, submission of specimen other than nasopharyngeal swab, presence of viral mutation(s) within the areas targeted by this assay, and inadequate number of viral copies (<250 copies / mL). A negative result must be combined with clinical observations, patient history, and epidemiological information.  Fact Sheet for Patients:   StrictlyIdeas.no  Fact Sheet for Healthcare Providers: BankingDealers.co.za  This test is not yet approved or  cleared by the [place] FDA and has been authorized for detection and/or diagnosis of SARS-CoV-2 by FDA under an Emergency Use Authorization (EUA).  This EUA will remain in effect (meaning this test can be used) for the duration of the COVID-19 declaration under Section 564(b)(1) of the Act, 21 U.S.C. section 360bbb-3(b)(1), unless the authorization is terminated or revoked sooner.  Performed at [facility], [address] [place],  [address]          Radiology  Studies: DG Chest 2 View  Result Date: 11/15/2019 CLINICAL DATA:  Short of breath, pneumonia EXAM: CHEST - 2 VIEW COMPARISON:  11/12/2019 FINDINGS: Frontal and lateral views of the chest demonstrate persistent bilateral perihilar airspace disease, slightly improved on the right since prior study but with significant progression on the left. There is no effusion or pneumothorax. Cardiac silhouette is enlarged but stable. Stable right internal jugular  dialysis catheter. No acute bony abnormalities. IMPRESSION: 1. Bilateral perihilar airspace disease, left greater than right. There has been progression in the left since prior study. Differential includes bilateral pneumonia versus asymmetric edema. Electronically Signed   By: Randa Ngo M.D.   On: 11/15/2019 15:37   ECHOCARDIOGRAM COMPLETE  Result Date: 11/16/2019    ECHOCARDIOGRAM REPORT   Patient Name:   Patrick Hurst Date of Exam: 11/16/2019 Medical Rec #:  [id number]      Height:       71.0 in Accession #:    [id number]     Weight:       308.6 lb Date of Birth:  February 02, 1956      BSA:          2.536 m Patient Age:    13 years       BP:           128/55 mmHg Patient Gender: M              HR:           74 bpm. Exam Location:  [facility] Procedure: 2D Echo, Cardiac Doppler and Color Doppler Indications:     Dyspnea 786.09  History:         Patient has prior history of Echocardiogram examinations, most                  recent 03/06/2019. CHF; Risk Factors:Sleep Apnea.  Sonographer:     Sherrie Sport RDCS (AE) Referring Phys:  Theola Sequin Diagnosing Phys: Ida Rogue MD IMPRESSIONS  1. Left ventricular ejection fraction, by estimation, is 60 to 65%. The left ventricle has normal function. The left ventricle has no regional wall motion abnormalities. There is mild left ventricular hypertrophy. Left ventricular diastolic parameters are consistent with Grade II diastolic dysfunction (pseudonormalization).  2. Right ventricular systolic function is normal. The right  ventricular size is normal. There is normal pulmonary artery systolic pressure. The estimated right ventricular systolic pressure is 19.6 mmHg.  3. The aortic valve is moderately calcified. Mild to moderate aortic valve sclerosis/calcification is present, without any evidence of aortic stenosis.  4. Left atrial size was mildly dilated. FINDINGS  Left Ventricle: Left ventricular ejection fraction, by estimation, is 60 to 65%. The left ventricle has normal function. The left ventricle has no regional wall motion abnormalities. The left ventricular internal cavity size was normal in size. There is  mild left ventricular hypertrophy. Left ventricular diastolic parameters are consistent with Grade II diastolic dysfunction (pseudonormalization). Right Ventricle: The right ventricular size is normal. No increase in right ventricular wall thickness. Right ventricular systolic function is normal. There is normal pulmonary artery systolic pressure. The tricuspid regurgitant velocity is 1.97 m/s, and  with an assumed right atrial pressure of 10 mmHg, the estimated right ventricular systolic pressure is 22.2 mmHg. Left Atrium: Left atrial size was mildly dilated. Right Atrium: Right atrial size was normal in size. Pericardium: There is no evidence of pericardial effusion. Mitral Valve: The mitral valve is normal in structure. Normal mobility of the mitral valve leaflets. Mild mitral annular calcification. Mild mitral valve regurgitation. No evidence of mitral valve stenosis. Tricuspid Valve: The tricuspid valve is normal in structure. Tricuspid valve regurgitation is not demonstrated. No evidence of tricuspid stenosis. Aortic Valve: The aortic valve is normal in structure. Aortic valve regurgitation is not visualized. Mild to moderate aortic valve sclerosis/calcification is present, without any evidence of aortic stenosis. Aortic valve mean gradient measures 8.5 mmHg.  Aortic valve peak gradient measures 16.6 mmHg. Aortic valve  area, by VTI measures 2.29 cm. Pulmonic Valve: The pulmonic valve was normal in structure. Pulmonic valve regurgitation is not visualized. No evidence of pulmonic stenosis. Aorta: The aortic root is normal in size and structure. Venous: The inferior vena cava is normal in size with greater than 50% respiratory variability, suggesting right atrial pressure of 3 mmHg. IAS/Shunts: No atrial level shunt detected by color flow Doppler.  LEFT VENTRICLE PLAX 2D LVIDd:         6.18 cm  Diastology LVIDs:         3.40 cm  LV e' lateral:   14.70 cm/s LV PW:         1.54 cm  LV E/e' lateral: 7.7 LV IVS:        0.93 cm  LV e' medial:    5.44 cm/s LVOT diam:     2.30 cm  LV E/e' medial:  20.8 LV SV:         97 LV SV Index:   38 LVOT Area:     4.15 cm  RIGHT VENTRICLE RV Basal diam:  3.25 cm RV S prime:     18.40 cm/s TAPSE (M-mode): 3.7 cm LEFT ATRIUM            Index       RIGHT ATRIUM           Index LA diam:      5.20 cm  2.05 cm/m  RA Area:     25.00 cm LA Vol (A2C): 187.0 ml 73.75 ml/m RA Volume:   79.50 ml  31.35 ml/m LA Vol (A4C): 112.0 ml 44.17 ml/m  AORTIC VALVE                    PULMONIC VALVE AV Area (Vmax):    2.10 cm     PV Vmax:        1.01 m/s AV Area (Vmean):   2.07 cm     PV Peak grad:   4.1 mmHg AV Area (VTI):     2.29 cm     RVOT Peak grad: 8 mmHg AV Vmax:           203.50 cm/s AV Vmean:          137.500 cm/s AV VTI:            0.422 m AV Peak Grad:      16.6 mmHg AV Mean Grad:      8.5 mmHg LVOT Vmax:         103.00 cm/s LVOT Vmean:        68.500 cm/s LVOT VTI:          0.233 m LVOT/AV VTI ratio: 0.55  AORTA Ao Root diam: 3.70 cm MITRAL VALVE                TRICUSPID VALVE MV Area (PHT): 3.20 cm     TR Peak grad:   15.5 mmHg MV Decel Time: 237 msec     TR Vmax:        197.00 cm/s MV E velocity: 113.00 cm/s MV A velocity: 100.00 cm/s  SHUNTS MV E/A ratio:  1.13         Systemic VTI:  0.23 m                             Systemic Diam: 2.30 cm Christia Reading  Rockey Situ MD Electronically signed by Ida Rogue  MD Signature Date/Time: 11/16/2019/2:31:17 PM    Final         Scheduled Meds: . sodium chloride   Intravenous Once  . amiodarone  200 mg Oral Daily  . amitriptyline  25 mg Oral QHS  . vitamin C  1,000 mg Oral BID  . aspirin EC  81 mg Oral Daily  . atorvastatin  40 mg Oral Daily  . calcitRIOL  0.5 mcg Oral Daily  . Chlorhexidine Gluconate Cloth  6 each Topical Q0600  . doxycycline  100 mg Oral Q12H  . febuxostat  80 mg Oral Daily  . fluticasone furoate-vilanterol  1 puff Inhalation Daily  . furosemide  40 mg Intravenous Q12H  . heparin  5,000 Units Subcutaneous Q8H  . hydrALAZINE  10 mg Oral TID  . insulin aspart  0-6 Units Subcutaneous TID WC  . ipratropium-albuterol  3 mL Inhalation Daily  . lactulose  10 g Oral Daily  . levothyroxine  100 mcg Oral Q0600  . loratadine  10 mg Oral Daily  . metoprolol tartrate  25 mg Oral BID  . sodium chloride flush  3 mL Intravenous Q12H   Continuous Infusions: . sodium chloride Stopped (11/17/19 0540)  . cefTRIAXone (ROCEPHIN)  IV Stopped (11/16/19 2150)     LOS: 2 days        Hosie Poisson, MD [facility]   To contact the attending provider between 7A-7P or the covering provider during after hours 7P-7A, please log into the web site [email or web address] and access using universal [place] password for that web site. If you do not have the password, please call the hospital operator.  11/17/2019, 9:41 AM

## 2019-11-17 NOTE — Evaluation (Signed)
Physical Therapy Evaluation Patient Details Name: Patrick Hurst MRN: [id number] DOB: 1955-09-22 Today's Date: 11/17/2019   History of Present Illness  64 year old with prior history of diastolic heart failure, end-stage renal disease on PD, does require intermittent HD, paroxysmal atrial fibrillation, anemia of chronic disease gallop, depression, Bell's palsy, carpal tunnel syndrome upper lipidemia, hypertension, DM, COPD, OHS, OSA on BIPAP at night, Hypothyroidism, presents to ED, with SOB.  Clinical Impression  Pt is a pleasant 64 year old M who was admitted for respiratory failure with hypoxia, suspected secondary to PNA vs. Edema, with PMh as described above. Pt performs bed mobility with mod I for extra time (supine<>sit), transfers STS with supervision, and ambulation with supervision/min A without AD (with 3L O2), requiring increased assistance for balance with increasing fatigue levels during ambulation. Pt managing IV pole during ambulation with 3L via [place]; pt requiring 3 standing rest breaks to improve O2 saturation, with sats ranging 85-89% during gait trial; and improving immediately with seated rest break. (Pt reports he does not use home oxygen other than BiPap at night). Pt demonstrates deficits with activity tolerance, balance and strength requiring continued skilled PT intervention to promote return to prior level of function and safe mobility.     Follow Up Recommendations Home health PT    Equipment Recommendations  Other (comment) (pt has rollator, cane, BSC; rec rollator so pt may take seated rest breaks with increasing fatigue.)    Recommendations for Other Services       Precautions / Restrictions Precautions Precautions: Fall Restrictions Weight Bearing Restrictions: No      Mobility  Bed Mobility Overal bed mobility: Modified Independent             General bed mobility comments: Performs supine<>sit independently, requiring extra time and demos mild  SOB  Transfers Overall transfer level: Needs assistance   Transfers: Sit to/from Stand Sit to Stand: Supervision            Ambulation/Gait Ambulation/Gait assistance: Min guard Gait Distance (Feet): 100 Feet Assistive device: IV Pole Gait Pattern/deviations: Narrow base of support     General Gait Details: Narrow BOS; with increasing fatigue, demos 1 incidence of scissors; 3 standing rest braeks during gait trial to increase O2 sat, with 3L O2 donned via   Stairs            Wheelchair Mobility    Modified Rankin (Stroke Patients Only)       Balance Overall balance assessment: Needs assistance Sitting-balance support: Feet supported;No upper extremity supported Sitting balance-Leahy Scale: Good Sitting balance - Comments: sits steady EOB   Standing balance support: During functional activity;No upper extremity supported Standing balance-Leahy Scale: Good Standing balance comment: able to perform static standing without UE support; loses balance posteriorly during more challenging balance activities (tandem stance); mild SOB with standing marching                             Pertinent Vitals/Pain Pain Assessment: No/denies pain    Home Living Family/patient expects to be discharged to:: Private residence Living Arrangements: Spouse/significant other Available Help at Discharge: Family;Available 24 hours/day Type of Home: House Home Access: Stairs to enter Entrance Stairs-Rails: Right Entrance Stairs-Number of Steps: Depending upon which side of the house. Per pt and wife about 4 steps from back where patient normally goes up. Home Layout: One level Home Equipment: Walker - 2 wheels;Cane - single point;Bedside commode Additional Comments: Patient is independent  at with ADLs, household/community ambulation without AD's though limited in distance by SOB.    Prior Function Level of Independence: Independent               Hand Dominance         Extremity/Trunk Assessment   Upper Extremity Assessment Upper Extremity Assessment: Generalized weakness    Lower Extremity Assessment Lower Extremity Assessment: Generalized weakness    Cervical / Trunk Assessment Cervical / Trunk Assessment: Normal  Communication   Communication: No difficulties  Cognition Arousal/Alertness: Awake/alert Behavior During Therapy: WFL for tasks assessed/performed Overall Cognitive Status: Within Functional Limits for tasks assessed                                        General Comments      Exercises     Assessment/Plan    PT Assessment Patient needs continued PT services  PT Problem List Decreased strength;Decreased mobility;Decreased safety awareness;Decreased activity tolerance;Decreased balance       PT Treatment Interventions DME instruction;Therapeutic exercise;Gait training;Balance training;Stair training;Neuromuscular re-education;Patient/family education;Therapeutic activities    PT Goals (Current goals can be found in the Care Plan section)  Acute Rehab PT Goals Patient Stated Goal: to go home PT Goal Formulation: With patient/family Time For Goal Achievement: 12/01/19 Potential to Achieve Goals: Good    Frequency Min 2X/week   Barriers to discharge        Co-evaluation               AM-PAC PT "6 Clicks" Mobility  Outcome Measure Help needed turning from your back to your side while in a flat bed without using bedrails?: None Help needed moving from lying on your back to sitting on the side of a flat bed without using bedrails?: A Little Help needed moving to and from a bed to a chair (including a wheelchair)?: A Little Help needed standing up from a chair using your arms (e.g., wheelchair or bedside chair)?: A Little Help needed to walk in hospital room?: A Little Help needed climbing 3-5 steps with a railing? : A Little 6 Click Score: 19    End of Session Equipment Utilized  During Treatment: Gait belt Activity Tolerance: Patient limited by fatigue Patient left: in bed;with call bell/phone within reach;with family/visitor present Nurse Communication: Mobility status PT Visit Diagnosis: Muscle weakness (generalized) (M62.81);Difficulty in walking, not elsewhere classified (R26.2)    Time: 2947-6546 PT Time Calculation (min) (ACUTE ONLY): 40 min   Charges:   PT Evaluation $PT Eval Moderate Complexity: 1 Mod PT Treatments $Gait Training: 8-22 mins        Petra Kuba, PT, DPT 11/17/19, 4:18 PM

## 2019-11-17 NOTE — Progress Notes (Signed)
Pd started 

## 2019-11-17 NOTE — Progress Notes (Signed)
[facility]  ROUNDING NOTE   Subjective:   Wife at bedside.  Back up hemodialysis treatment yesterday. UF of 3.5 liters.   Patient states he is breathing better.   PRBC transfusion yesterday.   Objective:  Vital signs in last 24 hours:  Temp:  [97.5 F (36.4 C)-99.5 F (37.5 C)] 98.8 F (37.1 C) (07/17 0848) Pulse Rate:  [68-95] 71 (07/17 0848) Resp:  [15-24] 16 (07/17 0848) BP: (121-142)/(54-81) 125/57 (07/17 0848) SpO2:  [89 %-99 %] 93 % (07/17 0848)  Weight change:  Filed Weights   11/15/19 1500  Weight: (!) 140 kg    Intake/Output: I/O last 3 completed shifts: In: 1669.2 [I.V.:236.2; Blood:1333; IV Piggyback:100] Out: 3500 [Other:3500]   Intake/Output this shift:  No intake/output data recorded.  Physical Exam: General: No acute distress  Head: Normocephalic, atraumatic. Moist oral mucosal membranes  Eyes: Anicteric  Neck: Supple, trachea midline  Lungs:  Bilateral crackles at bases  Heart: tachycardia  Abdomen:  Soft, nontender, bowel sounds present  Extremities: 1+ peripheral edema.  Neurologic: Awake, alert, following commands  Skin: No lesions  Access: PD catheter in place, right IJ PermCath in place, developing left upper extremity AV access    Basic Metabolic Panel: Recent Labs  Lab 11/15/19 1504 11/16/19 0159 11/17/19 0315  NA 134* 137 138  K 5.1 5.1 4.2  CL 93* 95* 95*  CO2 19* 22 27  GLUCOSE 295* 201* 139*  BUN 124* 109* 77*  CREATININE 13.21* 13.08* 8.22*  CALCIUM 7.9* 8.2* 8.4*    Liver Function Tests: Recent Labs  Lab 11/16/19 0159  AST 25  ALT 28  ALKPHOS 207*  BILITOT 0.7  PROT 7.6  ALBUMIN 3.1*   No results for input(s): LIPASE, AMYLASE in the last 168 hours. No results for input(s): AMMONIA in the last 168 hours.  CBC: Recent Labs  Lab 11/15/19 1504 11/16/19 0159 11/17/19 0315  WBC 8.3 7.3 7.3  NEUTROABS  --  5.2  --   HGB 6.4* 6.6* 7.3*  HCT 19.6* 20.6* 21.6*  MCV 95.1 95.4 90.8  PLT 120*  137* 138*    Cardiac Enzymes: No results for input(s): CKTOTAL, CKMB, CKMBINDEX, TROPONINI in the last 168 hours.  BNP: Invalid input(s): POCBNP  CBG: Recent Labs  Lab 11/17/19 0849  GLUCAP 129*    Microbiology: Results for orders placed or performed during the hospital encounter of 11/15/19  Blood Culture (routine x 2)     Status: None (Preliminary result)   Collection Time: 11/15/19  5:28 PM   Specimen: BLOOD  Result Value Ref Range Status   Specimen Description BLOOD BLOOD RIGHT FOREARM  Final   Special Requests   Final    BOTTLES DRAWN AEROBIC AND ANAEROBIC Blood Culture adequate volume   Culture   Final    NO GROWTH 2 DAYS Performed at [facility], [address] [place], [place] [address]    Report Status PENDING  Incomplete  Blood Culture (routine x 2)     Status: None (Preliminary result)   Collection Time: 11/15/19  5:28 PM   Specimen: BLOOD  Result Value Ref Range Status   Specimen Description BLOOD RIGHT ANTECUBITAL  Final   Special Requests   Final    BOTTLES DRAWN AEROBIC AND ANAEROBIC Blood Culture results may not be optimal due to an inadequate volume of blood received in culture bottles   Culture   Final    NO GROWTH 2 DAYS Performed at [facility], [address] [place], [place] [address]    Report Status PENDING  Incomplete  SARS Coronavirus 2 by RT PCR (hospital order, performed in [facility] hospital lab) Nasopharyngeal Nasopharyngeal Swab     Status: None   Collection Time: 11/15/19  7:14 PM   Specimen: Nasopharyngeal Swab  Result Value Ref Range Status   SARS Coronavirus 2 NEGATIVE NEGATIVE Final    Comment: (NOTE) SARS-CoV-2 target nucleic acids are NOT DETECTED.  The SARS-CoV-2 RNA is generally detectable in upper and lower respiratory specimens during the acute phase of infection. The lowest concentration of SARS-CoV-2 viral copies this assay can detect is 250 copies / mL. A negative result does not preclude  SARS-CoV-2 infection and should not be used as the sole basis for treatment or other patient management decisions.  A negative result may occur with improper specimen collection / handling, submission of specimen other than nasopharyngeal swab, presence of viral mutation(s) within the areas targeted by this assay, and inadequate number of viral copies (<250 copies / mL). A negative result must be combined with clinical observations, patient history, and epidemiological information.  Fact Sheet for Patients:   StrictlyIdeas.no  Fact Sheet for Healthcare Providers: BankingDealers.co.za  This test is not yet approved or  cleared by the [place] FDA and has been authorized for detection and/or diagnosis of SARS-CoV-2 by FDA under an Emergency Use Authorization (EUA).  This EUA will remain in effect (meaning this test can be used) for the duration of the COVID-19 declaration under Section 564(b)(1) of the Act, 21 U.S.C. section 360bbb-3(b)(1), unless the authorization is terminated or revoked sooner.  Performed at [facility], [address] [place], [place] [address]     Coagulation Studies: No results for input(s): LABPROT, INR in the last 72 hours.  Urinalysis: No results for input(s): COLORURINE, LABSPEC, PHURINE, GLUCOSEU, HGBUR, BILIRUBINUR, KETONESUR, PROTEINUR, UROBILINOGEN, NITRITE, LEUKOCYTESUR in the last 72 hours.  Invalid input(s): APPERANCEUR    Imaging: DG Chest 2 View  Result Date: 11/15/2019 CLINICAL DATA:  Short of breath, pneumonia EXAM: CHEST - 2 VIEW COMPARISON:  11/12/2019 FINDINGS: Frontal and lateral views of the chest demonstrate persistent bilateral perihilar airspace disease, slightly improved on the right since prior study but with significant progression on the left. There is no effusion or pneumothorax. Cardiac silhouette is enlarged but stable. Stable right internal jugular dialysis  catheter. No acute bony abnormalities. IMPRESSION: 1. Bilateral perihilar airspace disease, left greater than right. There has been progression in the left since prior study. Differential includes bilateral pneumonia versus asymmetric edema. Electronically Signed   By: Randa Ngo M.D.   On: 11/15/2019 15:37   ECHOCARDIOGRAM COMPLETE  Result Date: 11/16/2019    ECHOCARDIOGRAM REPORT   Patient Name:   Patrick Hurst Date of Exam: 11/16/2019 Medical Rec #:  [id number]      Height:       71.0 in Accession #:    [id number]     Weight:       308.6 lb Date of Birth:  [birth year]      BSA:          2.536 m Patient Age:    14 years       BP:           128/55 mmHg Patient Gender: M              HR:           74 bpm. Exam Location:  [facility] Procedure: 2D Echo,  Cardiac Doppler and Color Doppler Indications:     Dyspnea 786.09  History:         Patient has prior history of Echocardiogram examinations, most                  recent 03/06/2019. CHF; Risk Factors:Sleep Apnea.  Sonographer:     Sherrie Sport RDCS (AE) Referring Phys:  Theola Sequin Diagnosing Phys: Ida Rogue MD IMPRESSIONS  1. Left ventricular ejection fraction, by estimation, is 60 to 65%. The left ventricle has normal function. The left ventricle has no regional wall motion abnormalities. There is mild left ventricular hypertrophy. Left ventricular diastolic parameters are consistent with Grade II diastolic dysfunction (pseudonormalization).  2. Right ventricular systolic function is normal. The right ventricular size is normal. There is normal pulmonary artery systolic pressure. The estimated right ventricular systolic pressure is 53.9 mmHg.  3. The aortic valve is moderately calcified. Mild to moderate aortic valve sclerosis/calcification is present, without any evidence of aortic stenosis.  4. Left atrial size was mildly dilated. FINDINGS  Left Ventricle: Left ventricular ejection fraction, by estimation, is 60 to 65%. The left ventricle has normal  function. The left ventricle has no regional wall motion abnormalities. The left ventricular internal cavity size was normal in size. There is  mild left ventricular hypertrophy. Left ventricular diastolic parameters are consistent with Grade II diastolic dysfunction (pseudonormalization). Right Ventricle: The right ventricular size is normal. No increase in right ventricular wall thickness. Right ventricular systolic function is normal. There is normal pulmonary artery systolic pressure. The tricuspid regurgitant velocity is 1.97 m/s, and  with an assumed right atrial pressure of 10 mmHg, the estimated right ventricular systolic pressure is 76.7 mmHg. Left Atrium: Left atrial size was mildly dilated. Right Atrium: Right atrial size was normal in size. Pericardium: There is no evidence of pericardial effusion. Mitral Valve: The mitral valve is normal in structure. Normal mobility of the mitral valve leaflets. Mild mitral annular calcification. Mild mitral valve regurgitation. No evidence of mitral valve stenosis. Tricuspid Valve: The tricuspid valve is normal in structure. Tricuspid valve regurgitation is not demonstrated. No evidence of tricuspid stenosis. Aortic Valve: The aortic valve is normal in structure. Aortic valve regurgitation is not visualized. Mild to moderate aortic valve sclerosis/calcification is present, without any evidence of aortic stenosis. Aortic valve mean gradient measures 8.5 mmHg. Aortic valve peak gradient measures 16.6 mmHg. Aortic valve area, by VTI measures 2.29 cm. Pulmonic Valve: The pulmonic valve was normal in structure. Pulmonic valve regurgitation is not visualized. No evidence of pulmonic stenosis. Aorta: The aortic root is normal in size and structure. Venous: The inferior vena cava is normal in size with greater than 50% respiratory variability, suggesting right atrial pressure of 3 mmHg. IAS/Shunts: No atrial level shunt detected by color flow Doppler.  LEFT VENTRICLE PLAX 2D  LVIDd:         6.18 cm  Diastology LVIDs:         3.40 cm  LV e' lateral:   14.70 cm/s LV PW:         1.54 cm  LV E/e' lateral: 7.7 LV IVS:        0.93 cm  LV e' medial:    5.44 cm/s LVOT diam:     2.30 cm  LV E/e' medial:  20.8 LV SV:         97 LV SV Index:   38 LVOT Area:     4.15 cm  RIGHT VENTRICLE RV  Basal diam:  3.25 cm RV S prime:     18.40 cm/s TAPSE (M-mode): 3.7 cm LEFT ATRIUM            Index       RIGHT ATRIUM           Index LA diam:      5.20 cm  2.05 cm/m  RA Area:     25.00 cm LA Vol (A2C): 187.0 ml 73.75 ml/m RA Volume:   79.50 ml  31.35 ml/m LA Vol (A4C): 112.0 ml 44.17 ml/m  AORTIC VALVE                    PULMONIC VALVE AV Area (Vmax):    2.10 cm     PV Vmax:        1.01 m/s AV Area (Vmean):   2.07 cm     PV Peak grad:   4.1 mmHg AV Area (VTI):     2.29 cm     RVOT Peak grad: 8 mmHg AV Vmax:           203.50 cm/s AV Vmean:          137.500 cm/s AV VTI:            0.422 m AV Peak Grad:      16.6 mmHg AV Mean Grad:      8.5 mmHg LVOT Vmax:         103.00 cm/s LVOT Vmean:        68.500 cm/s LVOT VTI:          0.233 m LVOT/AV VTI ratio: 0.55  AORTA Ao Root diam: 3.70 cm MITRAL VALVE                TRICUSPID VALVE MV Area (PHT): 3.20 cm     TR Peak grad:   15.5 mmHg MV Decel Time: 237 msec     TR Vmax:        197.00 cm/s MV E velocity: 113.00 cm/s MV A velocity: 100.00 cm/s  SHUNTS MV E/A ratio:  1.13         Systemic VTI:  0.23 m                             Systemic Diam: 2.30 cm Ida Rogue MD Electronically signed by Ida Rogue MD Signature Date/Time: 11/16/2019/2:31:17 PM    Final      Medications:   . sodium chloride Stopped (11/17/19 0540)  . cefTRIAXone (ROCEPHIN)  IV Stopped (11/16/19 2150)   . sodium chloride   Intravenous Once  . amiodarone  200 mg Oral Daily  . amitriptyline  25 mg Oral QHS  . vitamin C  1,000 mg Oral BID  . aspirin EC  81 mg Oral Daily  . atorvastatin  40 mg Oral Daily  . calcitRIOL  0.5 mcg Oral Daily  . Chlorhexidine Gluconate Cloth  6  each Topical Q0600  . doxycycline  100 mg Oral Q12H  . febuxostat  80 mg Oral Daily  . [START ON 11/21/2019] ferrous sulfate  325 mg Oral BID WC  . fluticasone furoate-vilanterol  1 puff Inhalation Daily  . furosemide  40 mg Intravenous Q12H  . heparin  5,000 Units Subcutaneous Q8H  . hydrALAZINE  10 mg Oral TID  . insulin aspart  0-6 Units Subcutaneous TID WC  . ipratropium-albuterol  3 mL Inhalation Daily  . lactulose  10 g  Oral Daily  . levothyroxine  100 mcg Oral Q0600  . loratadine  10 mg Oral Daily  . metoprolol tartrate  25 mg Oral BID  . sodium chloride flush  3 mL Intravenous Q12H   sodium chloride, acetaminophen **OR** acetaminophen, albuterol, HYDROcodone-acetaminophen, morphine injection, sodium chloride flush  Assessment/ Plan:   [name] is a 64 y.o. white male with end stage renal disease on peritoneal dialysis, chronic diastolic heart failure, Bell's palsy, carpal tunnel syndrome, depression, GERD, gout, who is admitted to [facility] on 11/15/2019 for CAP (community acquired pneumonia) [J18.9] Acute respiratory failure with hypoxia ([place]) [J96.01] Anemia of chronic disease [D63.8] Community acquired pneumonia, unspecified laterality [J18.9]  [facility] 139kg CCPD 9 hours 4 exchanges  1. End stage renal disease: on peritoneal dialysis. Back up hemodialysis treatment yesterday. Tolerated treatment well.  Did not make adequacy this month and his prescription is to be changed.  - peritoneal dialysis treatment for tonight. Orders prepared.   2.  Anemia of chronic kidney disease status post PRBC transfusion on 7/16. No ESA on this admission.   3. Secondary hyperparathyroidism with hyperphosphatemia: outpatient labs on 7/12: PTH 335, calcium 8, phos 13.3.   - calcitriol  - not currently binders  4. Hypertension: well controlled. Currently on IV furosemide, hydralazine and metoprolol.  Home regimen includes amlodipine and torsemide.     LOS: 2 Zavannah Deblois 7/17/202111:10 AM

## 2019-11-18 ENCOUNTER — Inpatient Hospital Stay: Payer: Commercial Managed Care - PPO

## 2019-11-18 LAB — CBC
HCT: 22 % — ABNORMAL LOW (ref 39.0–52.0)
Hemoglobin: 7.5 g/dL — ABNORMAL LOW (ref 13.0–17.0)
MCH: 30.5 pg (ref 26.0–34.0)
MCHC: 34.1 g/dL (ref 30.0–36.0)
MCV: 89.4 fL (ref 80.0–100.0)
Platelets: 129 10*3/uL — ABNORMAL LOW (ref 150–400)
RBC: 2.46 MIL/uL — ABNORMAL LOW (ref 4.22–5.81)
RDW: 15.5 % (ref 11.5–15.5)
WBC: 7.8 10*3/uL (ref 4.0–10.5)
nRBC: 0.5 % — ABNORMAL HIGH (ref 0.0–0.2)

## 2019-11-18 LAB — BASIC METABOLIC PANEL
Anion gap: 18 — ABNORMAL HIGH (ref 5–15)
BUN: 89 mg/dL — ABNORMAL HIGH (ref 8–23)
CO2: 24 mmol/L (ref 22–32)
Calcium: 8.3 mg/dL — ABNORMAL LOW (ref 8.9–10.3)
Chloride: 94 mmol/L — ABNORMAL LOW (ref 98–111)
Creatinine, Ser: 9.38 mg/dL — ABNORMAL HIGH (ref 0.61–1.24)
GFR calc Af Amer: 6 mL/min — ABNORMAL LOW (ref >60)
GFR calc non Af Amer: 5 mL/min — ABNORMAL LOW (ref >60)
Glucose, Bld: 169 mg/dL — ABNORMAL HIGH (ref 70–99)
Potassium: 4 mmol/L (ref 3.5–5.1)
Sodium: 136 mmol/L (ref 135–145)

## 2019-11-18 LAB — GLUCOSE, CAPILLARY
Glucose-Capillary: 179 mg/dL — ABNORMAL HIGH (ref 70–99)
Glucose-Capillary: 236 mg/dL — ABNORMAL HIGH (ref 70–99)
Glucose-Capillary: 138 mg/dL — ABNORMAL HIGH (ref 70–99)
Glucose-Capillary: 242 mg/dL — ABNORMAL HIGH (ref 70–99)

## 2019-11-18 MED ORDER — APIXABAN 2.5 MG PO TABS
2.5000 mg | ORAL_TABLET | Freq: Two times a day (BID) | ORAL | Status: DC
  Administered 2019-11-18 – 2019-11-19 (×3): 2.5 mg via ORAL
  Filled 2019-11-18 (×4): qty 1

## 2019-11-18 MED ORDER — PANTOPRAZOLE SODIUM 40 MG PO TBEC
40.0000 mg | DELAYED_RELEASE_TABLET | Freq: Two times a day (BID) | ORAL | Status: DC
  Administered 2019-11-18 – 2019-11-23 (×11): 40 mg via ORAL
  Filled 2019-11-18 (×11): qty 1

## 2019-11-18 MED ORDER — IPRATROPIUM-ALBUTEROL 0.5-2.5 (3) MG/3ML IN SOLN
3.0000 mL | Freq: Every day | RESPIRATORY_TRACT | Status: DC
  Administered 2019-11-19 – 2019-11-23 (×5): 3 mL via RESPIRATORY_TRACT
  Filled 2019-11-18 (×5): qty 3

## 2019-11-18 NOTE — Progress Notes (Signed)
PROGRESS NOTE    Patrick Hurst  WCB:[id number] DOB: [birth year] DOA: 11/15/2019 PCP: [name], MD    Chief Complaint  Patient presents with  . Shortness of Breath    Brief Narrative:  64 year old with prior history of diastolic heart failure, end-stage renal disease on PD, does require intermittent HD, paroxysmal atrial fibrillation, anemia of chronic disease gallop, depression, Bell's palsy, carpal tunnel syndrome, Hyperlipidemia, hypertension, DM, COPD, OHS, OSA on BIPAP at night, Hypothyroidism, presents to ED, with SOB.    Assessment & Plan:   Principal Problem:   Respiratory failure with hypoxia (HCC) Active Problems:   Diabetes mellitus with neuropathy (HCC)   Hypertension   Hypothyroidism   Anemia, iron deficiency   Chronic diastolic CHF (congestive heart failure) (HCC)   COPD (chronic obstructive pulmonary disease) (HCC)   ESRD on dialysis ([place])   CAP (community acquired pneumonia)    Acute respiratory failure with hypoxia probably secondary to combination of bilateral pneumonia  And fluid overload from inadequate peritoneal dialysis and acute on chronic diastolic heart failure.   Patient currently requiring up to 4 L of nasal cannula oxygen to keep sats greater than 90%. He was started on IV Rocephin and Zithromax for the community-acquired pneumonia Follow-up blood cultures ordered, sputum cultures. Started the patient on IV lasix 40 mg BID ,  Urine output was not measured.  Strict intake and output and daily weights.  Echocardiogram showed persevered left ventricular ejection fracture and grade 2 diastolic dysfunction.   Nephrology consulted for HD, underwent  HD session on 11/16/19 and 3.5 lit of fluid removed.   Anemia of chronic disease:  Hemoglobin at baseline between 8 to 9. On admission hemoglobin around 6.6. s/p 1 unit of prbc transfusion ordered.  No h/o rectal bleeding or melena.  Stool for occult blood ordered , pending.  Hemoglobin stable  around 7.5. Anemia panel ordered for further evaluation showed iron level of 32, ferritin of 627, folate of 14.8 and vitamin B 12 of  428.  Iron supplementation on discharge.    ESRD on PD and on HD intermittently.  Appreciate nephrology recommendations.  Plan for PD tonight.     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 CBG (last 3)  Recent Labs    11/17/19 1642 11/18/19 0756 11/18/19 1247  GLUCAP 170* 179* 236*    A1c is 5.8 Restart home meds and SSI.     Essential hypertension:  Well controlled.    OHS and OSA on BIPAP at night  Continue the same.    Body mass index is 43.05 kg/m. Morbid obesity Poor prognostic factor.     Hypothyroidism:  Resume synthroid.     Hyperlipidemia:  Resume statin.   PAF:  Rate controlled with amiodarone.  No evidence of obvious bleeing, will restart him on Eliquis.     COPD: No wheezing heard today.  Resume bronchodilators    DVT prophylaxis: Eliquis  code Status: Full code Family Communication: None at bedside Disposition:   Status is: Inpatient  Remains inpatient appropriate because:Hemodynamically unstable and IV treatments appropriate due to intensity of illness or inability to take PO   Dispo: The patient is from: Home              Anticipated d/c is to: Pending              Anticipated d/c date is: 2 days              Patient currently is not  medically stable to d/c.       Consultants:   Nephrology  Procedures: Hemodialysis  Antimicrobials: Rocephin and Zithromax since admission   Subjective: Pt reports breathing has improved, appears to bein good spirits. No chest pain , no nausea, vomiting or abd pain.   Objective: Vitals:   11/17/19 2031 11/18/19 0036 11/18/19 0759 11/18/19 1250  BP: (!) 139/92 (!) 142/71 (!) 145/78 (!) 152/78  Pulse: 71 70 (!) 53 69  Resp: 15 17 18 16   Temp: 97.9 F (36.6 C) 97.8 F (36.6 C) 98.7 F (37.1 C) 98.8 F (37.1 C)    TempSrc: Oral     SpO2: 92% 94% 93% (!) 89%  Weight:      Height:        Intake/Output Summary (Last 24 hours) at 11/18/2019 1433 Last data filed at 11/18/2019 0906 Gross per 24 hour  Intake --  Output 2099 ml  Net -2099 ml   Filed Weights   11/15/19 1500  Weight: (!) 140 kg    Examination:  General exam: alert and comfortable. Not in distress.  Respiratory system: Air entry fair bilateral, no wheezing or rhonchi. Tachypnea present.  Cardiovascular system: S1s2 , RRR, no JVD, pedal edema present.  Gastrointestinal system: abd is soft, non tender PD catheter site intact, bowel sounds wnl.  Central nervous system: Alert and oriented, grossly non focal.  Extremities: No cyanosis or clubbing, , left upper extremity AV fistula Skin: No rashes.  Psychiatry:  Mood is appropriate.     Data Reviewed: I have personally reviewed following labs and imaging studies  CBC: Recent Labs  Lab 11/15/19 1504 11/16/19 0159 11/17/19 0315 11/18/19 0357  WBC 8.3 7.3 7.3 7.8  NEUTROABS  --  5.2  --   --   HGB 6.4* 6.6* 7.3* 7.5*  HCT 19.6* 20.6* 21.6* 22.0*  MCV 95.1 95.4 90.8 89.4  PLT 120* 137* 138* 129*    Basic Metabolic Panel: Recent Labs  Lab 11/15/19 1504 11/16/19 0159 11/17/19 0315 11/17/19 2035 11/18/19 0357  NA 134* 137 138  --  136  K 5.1 5.1 4.2  --  4.0  CL 93* 95* 95*  --  94*  CO2 19* 22 27  --  24  GLUCOSE 295* 201* 139*  --  169*  BUN 124* 109* 77*  --  89*  CREATININE 13.21* 13.08* 8.22*  --  9.38*  CALCIUM 7.9* 8.2* 8.4*  --  8.3*  PHOS  --   --   --  9.6*  --     GFR: Estimated Creatinine Clearance: 11.7 mL/min (A) (by C-G formula based on SCr of 9.38 mg/dL (H)).  Liver Function Tests: Recent Labs  Lab 11/16/19 0159  AST 25  ALT 28  ALKPHOS 207*  BILITOT 0.7  PROT 7.6  ALBUMIN 3.1*    CBG: Recent Labs  Lab 11/17/19 0849 11/17/19 1150 11/17/19 1642 11/18/19 0756 11/18/19 1247  GLUCAP 129* 259* 170* 179* 236*     Recent Results  (from the past 240 hour(s))  Blood Culture (routine x 2)     Status: None (Preliminary result)   Collection Time: 11/15/19  5:28 PM   Specimen: BLOOD  Result Value Ref Range Status   Specimen Description BLOOD BLOOD RIGHT FOREARM  Final   Special Requests   Final    BOTTLES DRAWN AEROBIC AND ANAEROBIC Blood Culture adequate volume   Culture   Final    NO GROWTH 3 DAYS Performed at [facility], [address] [place], [place] [address]    Report Status PENDING  Incomplete  Blood Culture (routine x 2)     Status: None (Preliminary result)   Collection Time: 11/15/19  5:28 PM   Specimen: BLOOD  Result Value Ref Range Status   Specimen Description BLOOD RIGHT ANTECUBITAL  Final   Special Requests   Final    BOTTLES DRAWN AEROBIC AND ANAEROBIC Blood Culture results may not be optimal due to an inadequate volume of blood received in culture bottles   Culture   Final    NO GROWTH 3 DAYS Performed at [facility], [address], [place], [place] [address]    Report Status PENDING  Incomplete  SARS Coronavirus 2 by RT PCR (hospital order, performed in [facility] hospital lab) Nasopharyngeal Nasopharyngeal Swab     Status: None   Collection Time: 11/15/19  7:14 PM   Specimen: Nasopharyngeal Swab  Result Value Ref Range Status   SARS Coronavirus 2 NEGATIVE NEGATIVE Final    Comment: (NOTE) SARS-CoV-2 target nucleic acids are NOT DETECTED.  The SARS-CoV-2 RNA is generally detectable in upper and lower respiratory specimens during the acute phase of infection. The lowest concentration of SARS-CoV-2 viral copies this assay can detect is 250 copies / mL. A negative result does not preclude SARS-CoV-2 infection and should not be used as the sole basis for treatment or other patient management decisions.  A negative result may occur with improper specimen collection / handling, submission of specimen other than nasopharyngeal swab, presence of viral mutation(s) within  the areas targeted by this assay, and inadequate number of viral copies (<250 copies / mL). A negative result must be combined with clinical observations, patient history, and epidemiological information.  Fact Sheet for Patients:   StrictlyIdeas.no  Fact Sheet for Healthcare Providers: BankingDealers.co.za  This test is not yet approved or  cleared by the [place] FDA and has been authorized for detection and/or diagnosis of SARS-CoV-2 by FDA under an Emergency Use Authorization (EUA).  This EUA will remain in effect (meaning this test can be used) for the duration of the COVID-19 declaration under Section 564(b)(1) of the Act, 21 U.S.C. section 360bbb-3(b)(1), unless the authorization is terminated or revoked sooner.  Performed at [facility], [address] [place], [place] [address]          Radiology Studies: No results found.      Scheduled Meds: . sodium chloride   Intravenous Once  . amiodarone  200 mg Oral Daily  . amitriptyline  25 mg Oral QHS  . vitamin C  1,000 mg Oral BID  . aspirin EC  81 mg Oral Daily  . atorvastatin  40 mg Oral Daily  . calcitRIOL  0.5 mcg Oral Daily  . Chlorhexidine Gluconate Cloth  6 each Topical Q0600  . doxycycline  100 mg Oral Q12H  . febuxostat  80 mg Oral Daily  . [START ON 11/21/2019] ferrous sulfate  325 mg Oral BID WC  . fluticasone furoate-vilanterol  1 puff Inhalation Daily  . furosemide  40 mg Intravenous Q12H  . gentamicin cream  1 application Topical Daily  . heparin  5,000 Units Subcutaneous Q8H  . hydrALAZINE  10 mg Oral TID  . insulin aspart  0-6 Units Subcutaneous TID WC  . [START ON 11/19/2019] ipratropium-albuterol  3 mL Inhalation Daily  . lactulose  10 g Oral Daily  . levothyroxine  100 mcg Oral Q0600  . loratadine  10 mg Oral  Daily  . metoprolol tartrate  25 mg Oral BID  . pantoprazole  40 mg Oral BID  . sodium chloride flush  3 mL  Intravenous Q12H   Continuous Infusions: . sodium chloride Stopped (11/18/19 0354)  . sodium chloride    . sodium chloride    . cefTRIAXone (ROCEPHIN)  IV Stopped (11/18/19 0355)  . dialysis solution 2.5% low-MG/low-CA       LOS: 3 days        Hosie Poisson, MD [facility]   To contact the attending provider between 7A-7P or the covering provider during after hours 7P-7A, please log into the web site [email or web address] and access using universal [place] password for that web site. If you do not have the password, please call the hospital operator.  11/18/2019, 2:33 PM

## 2019-11-18 NOTE — Progress Notes (Signed)
[facility]  ROUNDING NOTE   Subjective:   Peritoneal dialysis last night. Tolerated treatment well. UF of 1613mL.   Objective:  Vital signs in last 24 hours:  Temp:  [97.8 F (36.6 C)-98.8 F (37.1 C)] 98.7 F (37.1 C) (07/18 0759) Pulse Rate:  [53-73] 53 (07/18 0759) Resp:  [15-18] 18 (07/18 0759) BP: (101-145)/(54-92) 145/78 (07/18 0759) SpO2:  [89 %-94 %] 93 % (07/18 0759)  Weight change:  Filed Weights   11/15/19 1500  Weight: (!) 140 kg    Intake/Output: I/O last 3 completed shifts: In: 186.2 [I.V.:86.2; IV Piggyback:100] Out: 500 [Urine:500]   Intake/Output this shift:  Total I/O In: -  Out: 1599 [Other:1599]  Physical Exam: General: No acute distress  Head: Normocephalic, atraumatic. Moist oral mucosal membranes  Eyes: Anicteric  Neck: Supple, trachea midline  Lungs:  Clear bilaterally  Heart: regular  Abdomen:  Soft, nontender, bowel sounds present  Extremities: no peripheral edema.  Neurologic: Awake, alert, following commands  Skin: No lesions  Access: PD catheter in place, right IJ PermCath in place, developing left upper extremity AV access    Basic Metabolic Panel: Recent Labs  Lab 11/15/19 1504 11/15/19 1504 11/16/19 0159 11/17/19 0315 11/17/19 2035 11/18/19 0357  NA 134*  --  137 138  --  136  K 5.1  --  5.1 4.2  --  4.0  CL 93*  --  95* 95*  --  94*  CO2 19*  --  22 27  --  24  GLUCOSE 295*  --  201* 139*  --  169*  BUN 124*  --  109* 77*  --  89*  CREATININE 13.21*  --  13.08* 8.22*  --  9.38*  CALCIUM 7.9*   < > 8.2* 8.4*  --  8.3*  PHOS  --   --   --   --  9.6*  --    < > = values in this interval not displayed.    Liver Function Tests: Recent Labs  Lab 11/16/19 0159  AST 25  ALT 28  ALKPHOS 207*  BILITOT 0.7  PROT 7.6  ALBUMIN 3.1*   No results for input(s): LIPASE, AMYLASE in the last 168 hours. No results for input(s): AMMONIA in the last 168 hours.  CBC: Recent Labs  Lab 11/15/19 1504  11/16/19 0159 11/17/19 0315 11/18/19 0357  WBC 8.3 7.3 7.3 7.8  NEUTROABS  --  5.2  --   --   HGB 6.4* 6.6* 7.3* 7.5*  HCT 19.6* 20.6* 21.6* 22.0*  MCV 95.1 95.4 90.8 89.4  PLT 120* 137* 138* 129*    Cardiac Enzymes: No results for input(s): CKTOTAL, CKMB, CKMBINDEX, TROPONINI in the last 168 hours.  BNP: Invalid input(s): POCBNP  CBG: Recent Labs  Lab 11/17/19 0849 11/17/19 1150 11/17/19 1642 11/18/19 0756  GLUCAP 129* 259* 170* 179*    Microbiology: Results for orders placed or performed during the hospital encounter of 11/15/19  Blood Culture (routine x 2)     Status: None (Preliminary result)   Collection Time: 11/15/19  5:28 PM   Specimen: BLOOD  Result Value Ref Range Status   Specimen Description BLOOD BLOOD RIGHT FOREARM  Final   Special Requests   Final    BOTTLES DRAWN AEROBIC AND ANAEROBIC Blood Culture adequate volume   Culture   Final    NO GROWTH 3 DAYS Performed at [facility], [address], [place], [place] [address]    Report Status PENDING  Incomplete  Blood Culture (routine x 2)     Status: None (Preliminary result)   Collection Time: 11/15/19  5:28 PM   Specimen: BLOOD  Result Value Ref Range Status   Specimen Description BLOOD RIGHT ANTECUBITAL  Final   Special Requests   Final    BOTTLES DRAWN AEROBIC AND ANAEROBIC Blood Culture results may not be optimal due to an inadequate volume of blood received in culture bottles   Culture   Final    NO GROWTH 3 DAYS Performed at [facility], [address] [place], [place] [address]    Report Status PENDING  Incomplete  SARS Coronavirus 2 by RT PCR (hospital order, performed in [facility] hospital lab) Nasopharyngeal Nasopharyngeal Swab     Status: None   Collection Time: 11/15/19  7:14 PM   Specimen: Nasopharyngeal Swab  Result Value Ref Range Status   SARS Coronavirus 2 NEGATIVE NEGATIVE Final    Comment: (NOTE) SARS-CoV-2 target nucleic acids are NOT DETECTED.  The  SARS-CoV-2 RNA is generally detectable in upper and lower respiratory specimens during the acute phase of infection. The lowest concentration of SARS-CoV-2 viral copies this assay can detect is 250 copies / mL. A negative result does not preclude SARS-CoV-2 infection and should not be used as the sole basis for treatment or other patient management decisions.  A negative result may occur with improper specimen collection / handling, submission of specimen other than nasopharyngeal swab, presence of viral mutation(s) within the areas targeted by this assay, and inadequate number of viral copies (<250 copies / mL). A negative result must be combined with clinical observations, patient history, and epidemiological information.  Fact Sheet for Patients:   StrictlyIdeas.no  Fact Sheet for Healthcare Providers: BankingDealers.co.za  This test is not yet approved or  cleared by the [place] FDA and has been authorized for detection and/or diagnosis of SARS-CoV-2 by FDA under an Emergency Use Authorization (EUA).  This EUA will remain in effect (meaning this test can be used) for the duration of the COVID-19 declaration under Section 564(b)(1) of the Act, 21 U.S.C. section 360bbb-3(b)(1), unless the authorization is terminated or revoked sooner.  Performed at [facility], [address] [place], [place] [address]     Coagulation Studies: No results for input(s): LABPROT, INR in the last 72 hours.  Urinalysis: No results for input(s): COLORURINE, LABSPEC, PHURINE, GLUCOSEU, HGBUR, BILIRUBINUR, KETONESUR, PROTEINUR, UROBILINOGEN, NITRITE, LEUKOCYTESUR in the last 72 hours.  Invalid input(s): APPERANCEUR    Imaging: ECHOCARDIOGRAM COMPLETE  Result Date: 11/16/2019    ECHOCARDIOGRAM REPORT   Patient Name:   Patrick Hurst Date of Exam: 11/16/2019 Medical Rec #:  [id number]      Height:       71.0 in Accession #:    [id number]      Weight:       308.6 lb Date of Birth:  10/28/1955      BSA:          2.536 m Patient Age:    64 years       BP:           128/55 mmHg Patient Gender: M              HR:           74 bpm. Exam Location:  [facility] Procedure: 2D Echo, Cardiac Doppler and Color Doppler Indications:     Dyspnea 786.09  History:  Patient has prior history of Echocardiogram examinations, most                  recent 03/06/2019. CHF; Risk Factors:Sleep Apnea.  Sonographer:     Sherrie Sport RDCS (AE) Referring Phys:  Theola Sequin Diagnosing Phys: Ida Rogue MD IMPRESSIONS  1. Left ventricular ejection fraction, by estimation, is 60 to 65%. The left ventricle has normal function. The left ventricle has no regional wall motion abnormalities. There is mild left ventricular hypertrophy. Left ventricular diastolic parameters are consistent with Grade II diastolic dysfunction (pseudonormalization).  2. Right ventricular systolic function is normal. The right ventricular size is normal. There is normal pulmonary artery systolic pressure. The estimated right ventricular systolic pressure is 11.9 mmHg.  3. The aortic valve is moderately calcified. Mild to moderate aortic valve sclerosis/calcification is present, without any evidence of aortic stenosis.  4. Left atrial size was mildly dilated. FINDINGS  Left Ventricle: Left ventricular ejection fraction, by estimation, is 60 to 65%. The left ventricle has normal function. The left ventricle has no regional wall motion abnormalities. The left ventricular internal cavity size was normal in size. There is  mild left ventricular hypertrophy. Left ventricular diastolic parameters are consistent with Grade II diastolic dysfunction (pseudonormalization). Right Ventricle: The right ventricular size is normal. No increase in right ventricular wall thickness. Right ventricular systolic function is normal. There is normal pulmonary artery systolic pressure. The tricuspid regurgitant velocity is 1.97  m/s, and  with an assumed right atrial pressure of 10 mmHg, the estimated right ventricular systolic pressure is 41.7 mmHg. Left Atrium: Left atrial size was mildly dilated. Right Atrium: Right atrial size was normal in size. Pericardium: There is no evidence of pericardial effusion. Mitral Valve: The mitral valve is normal in structure. Normal mobility of the mitral valve leaflets. Mild mitral annular calcification. Mild mitral valve regurgitation. No evidence of mitral valve stenosis. Tricuspid Valve: The tricuspid valve is normal in structure. Tricuspid valve regurgitation is not demonstrated. No evidence of tricuspid stenosis. Aortic Valve: The aortic valve is normal in structure. Aortic valve regurgitation is not visualized. Mild to moderate aortic valve sclerosis/calcification is present, without any evidence of aortic stenosis. Aortic valve mean gradient measures 8.5 mmHg. Aortic valve peak gradient measures 16.6 mmHg. Aortic valve area, by VTI measures 2.29 cm. Pulmonic Valve: The pulmonic valve was normal in structure. Pulmonic valve regurgitation is not visualized. No evidence of pulmonic stenosis. Aorta: The aortic root is normal in size and structure. Venous: The inferior vena cava is normal in size with greater than 50% respiratory variability, suggesting right atrial pressure of 3 mmHg. IAS/Shunts: No atrial level shunt detected by color flow Doppler.  LEFT VENTRICLE PLAX 2D LVIDd:         6.18 cm  Diastology LVIDs:         3.40 cm  LV e' lateral:   14.70 cm/s LV PW:         1.54 cm  LV E/e' lateral: 7.7 LV IVS:        0.93 cm  LV e' medial:    5.44 cm/s LVOT diam:     2.30 cm  LV E/e' medial:  20.8 LV SV:         97 LV SV Index:   38 LVOT Area:     4.15 cm  RIGHT VENTRICLE RV Basal diam:  3.25 cm RV S prime:     18.40 cm/s TAPSE (M-mode): 3.7 cm LEFT ATRIUM  Index       RIGHT ATRIUM           Index LA diam:      5.20 cm  2.05 cm/m  RA Area:     25.00 cm LA Vol (A2C): 187.0 ml 73.75 ml/m  RA Volume:   79.50 ml  31.35 ml/m LA Vol (A4C): 112.0 ml 44.17 ml/m  AORTIC VALVE                    PULMONIC VALVE AV Area (Vmax):    2.10 cm     PV Vmax:        1.01 m/s AV Area (Vmean):   2.07 cm     PV Peak grad:   4.1 mmHg AV Area (VTI):     2.29 cm     RVOT Peak grad: 8 mmHg AV Vmax:           203.50 cm/s AV Vmean:          137.500 cm/s AV VTI:            0.422 m AV Peak Grad:      16.6 mmHg AV Mean Grad:      8.5 mmHg LVOT Vmax:         103.00 cm/s LVOT Vmean:        68.500 cm/s LVOT VTI:          0.233 m LVOT/AV VTI ratio: 0.55  AORTA Ao Root diam: 3.70 cm MITRAL VALVE                TRICUSPID VALVE MV Area (PHT): 3.20 cm     TR Peak grad:   15.5 mmHg MV Decel Time: 237 msec     TR Vmax:        197.00 cm/s MV E velocity: 113.00 cm/s MV A velocity: 100.00 cm/s  SHUNTS MV E/A ratio:  1.13         Systemic VTI:  0.23 m                             Systemic Diam: 2.30 cm Ida Rogue MD Electronically signed by Ida Rogue MD Signature Date/Time: 11/16/2019/2:31:17 PM    Final      Medications:   . sodium chloride Stopped (11/18/19 0354)  . sodium chloride    . sodium chloride    . cefTRIAXone (ROCEPHIN)  IV Stopped (11/18/19 0355)  . dialysis solution 2.5% low-MG/low-CA     . sodium chloride   Intravenous Once  . amiodarone  200 mg Oral Daily  . amitriptyline  25 mg Oral QHS  . vitamin C  1,000 mg Oral BID  . aspirin EC  81 mg Oral Daily  . atorvastatin  40 mg Oral Daily  . calcitRIOL  0.5 mcg Oral Daily  . Chlorhexidine Gluconate Cloth  6 each Topical Q0600  . doxycycline  100 mg Oral Q12H  . febuxostat  80 mg Oral Daily  . [START ON 11/21/2019] ferrous sulfate  325 mg Oral BID WC  . fluticasone furoate-vilanterol  1 puff Inhalation Daily  . furosemide  40 mg Intravenous Q12H  . gentamicin cream  1 application Topical Daily  . heparin  5,000 Units Subcutaneous Q8H  . hydrALAZINE  10 mg Oral TID  . insulin aspart  0-6 Units Subcutaneous TID WC  . [START ON 11/19/2019]  ipratropium-albuterol  3 mL Inhalation Daily  . lactulose  10 g Oral Daily  . levothyroxine  100 mcg Oral Q0600  . loratadine  10 mg Oral Daily  . metoprolol tartrate  25 mg Oral BID  . sodium chloride flush  3 mL Intravenous Q12H   sodium chloride, sodium chloride, sodium chloride, acetaminophen **OR** acetaminophen, albuterol, alteplase, heparin, HYDROcodone-acetaminophen, lidocaine (PF), lidocaine-prilocaine, morphine injection, pentafluoroprop-tetrafluoroeth, sodium chloride flush, traZODone  Assessment/ Plan:   [name] is a 64 y.o. white male with end stage renal disease on peritoneal dialysis, chronic diastolic heart failure, Bell's palsy, carpal tunnel syndrome, depression, GERD, gout, who is admitted to [facility] on 11/15/2019 for CAP (community acquired pneumonia) [J18.9] Acute respiratory failure with hypoxia ([place]) [J96.01] Anemia of chronic disease [D63.8] Community acquired pneumonia, unspecified laterality [J18.9]  [facility] 139kg CCPD 9 hours 4 exchanges  1. End stage renal disease: on peritoneal dialysis. Back up hemodialysis treatment 7/16.   Did not make adequacy this month and his prescription is to be changed.  - peritoneal dialysis treatment for tonight. Orders prepared.   2.  Anemia of chronic kidney disease status post PRBC transfusion on 7/16. No ESA on this admission.   3. Secondary hyperparathyroidism with hyperphosphatemia: outpatient labs on 7/12: PTH 335, calcium 8, phos 13.3.   - calcitriol  - not currently binders  4. Hypertension: well controlled. Currently on IV furosemide, hydralazine and metoprolol.  Home regimen includes amlodipine and torsemide.    LOS: 3 Patrick Hurst 7/18/202111:02 AM

## 2019-11-18 NOTE — Progress Notes (Signed)
Pd started 

## 2019-11-18 NOTE — Progress Notes (Signed)
Pd completed without difficulties

## 2019-11-19 ENCOUNTER — Inpatient Hospital Stay: Payer: Commercial Managed Care - PPO

## 2019-11-19 LAB — RESPIRATORY PANEL BY PCR

## 2019-11-19 LAB — MRSA PCR SCREENING: MRSA by PCR: NEGATIVE

## 2019-11-19 LAB — COMPREHENSIVE METABOLIC PANEL
ALT: 30 U/L (ref 0–44)
AST: 22 U/L (ref 15–41)
Albumin: 3.3 g/dL — ABNORMAL LOW (ref 3.5–5.0)
Alkaline Phosphatase: 250 U/L — ABNORMAL HIGH (ref 38–126)
Anion gap: 18 — ABNORMAL HIGH (ref 5–15)
BUN: 91 mg/dL — ABNORMAL HIGH (ref 8–23)
CO2: 25 mmol/L (ref 22–32)
Calcium: 9.1 mg/dL (ref 8.9–10.3)
Chloride: 94 mmol/L — ABNORMAL LOW (ref 98–111)
Creatinine, Ser: 9.57 mg/dL — ABNORMAL HIGH (ref 0.61–1.24)
GFR calc Af Amer: 6 mL/min — ABNORMAL LOW (ref >60)
GFR calc non Af Amer: 5 mL/min — ABNORMAL LOW (ref >60)
Glucose, Bld: 243 mg/dL — ABNORMAL HIGH (ref 70–99)
Potassium: 4.5 mmol/L (ref 3.5–5.1)
Sodium: 137 mmol/L (ref 135–145)
Total Bilirubin: 0.7 mg/dL (ref 0.3–1.2)
Total Protein: 7.9 g/dL (ref 6.5–8.1)

## 2019-11-19 LAB — OCCULT BLOOD X 1 CARD TO LAB, STOOL: Fecal Occult Bld: POSITIVE — AB

## 2019-11-19 LAB — PROCALCITONIN: Procalcitonin: 0.94 ng/mL

## 2019-11-19 LAB — CBC
HCT: 24 % — ABNORMAL LOW (ref 39.0–52.0)
Hemoglobin: 8 g/dL — ABNORMAL LOW (ref 13.0–17.0)
MCH: 30.5 pg (ref 26.0–34.0)
MCHC: 33.3 g/dL (ref 30.0–36.0)
MCV: 91.6 fL (ref 80.0–100.0)
Platelets: 125 10*3/uL — ABNORMAL LOW (ref 150–400)
RBC: 2.62 MIL/uL — ABNORMAL LOW (ref 4.22–5.81)
RDW: 15.3 % (ref 11.5–15.5)
WBC: 6.3 10*3/uL (ref 4.0–10.5)
nRBC: 0 % (ref 0.0–0.2)

## 2019-11-19 LAB — EXPECTORATED SPUTUM ASSESSMENT W GRAM STAIN, RFLX TO RESP C

## 2019-11-19 LAB — GLUCOSE, CAPILLARY
Glucose-Capillary: 219 mg/dL — ABNORMAL HIGH (ref 70–99)
Glucose-Capillary: 231 mg/dL — ABNORMAL HIGH (ref 70–99)
Glucose-Capillary: 266 mg/dL — ABNORMAL HIGH (ref 70–99)
Glucose-Capillary: 222 mg/dL — ABNORMAL HIGH (ref 70–99)

## 2019-11-19 MED ORDER — INSULIN REGULAR HUMAN (CONC) 500 UNIT/ML ~~LOC~~ SOPN: 15.0000 [IU] | PEN_INJECTOR | Freq: Two times a day (BID) | SUBCUTANEOUS | Status: DC

## 2019-11-19 MED ORDER — ONDANSETRON HCL 4 MG/2ML IJ SOLN
4.0000 mg | Freq: Four times a day (QID) | INTRAMUSCULAR | Status: DC | PRN
  Administered 2019-11-19: 03:00:00 4 mg via INTRAVENOUS
  Filled 2019-11-19: qty 2

## 2019-11-19 MED ORDER — INSULIN REGULAR HUMAN (CONC) 500 UNIT/ML ~~LOC~~ SOPN
15.0000 [IU] | PEN_INJECTOR | Freq: Two times a day (BID) | SUBCUTANEOUS | Status: DC
  Administered 2019-11-19 – 2019-11-23 (×8): 15 [IU] via SUBCUTANEOUS
  Filled 2019-11-19: qty 3

## 2019-11-19 MED ORDER — INSULIN REGULAR HUMAN (CONC) 500 UNIT/ML ~~LOC~~ SOPN
50.0000 [IU] | PEN_INJECTOR | Freq: Two times a day (BID) | SUBCUTANEOUS | Status: DC
  Filled 2019-11-19 (×2): qty 3

## 2019-11-19 NOTE — Plan of Care (Signed)
Pt very sleepy this shift 02 at 4L but drops to 80s when asleep not wearing BIPAP turned o2 up to 6L to maintain levels above 90% this AM. report bipap not working well and pt desat during night. Denies pain and needs. Diminisjed lungs, pt went back to sleep will continue to monitor

## 2019-11-19 NOTE — Progress Notes (Signed)
Inpatient Diabetes Program Recommendations  AACE/ADA: New Consensus Statement on Inpatient Glycemic Control (2015)  Target Ranges:  Prepandial:   less than 140 mg/dL      Peak postprandial:   less than 180 mg/dL (1-2 hours)      Critically ill patients:  140 - 180 mg/dL   Lab Results  Component Value Date   GLUCAP 219 (H) 11/19/2019   HGBA1C 5.8 (A) 11/12/2019    Review of Glycemic Control Results for AVIEN, TAHA (MRN [id number]) as of 11/19/2019 09:26  Ref. Range 11/18/2019 07:56 11/18/2019 12:47 11/18/2019 16:46 11/18/2019 20:43 11/19/2019 09:13  Glucose-Capillary Latest Ref Range: 70 - 99 mg/dL 179 (H)  Novolog  1 unit 236 (H)  Novolog  2 units 138 (H)  None 242 (H) 219 (H)   Diabetes history: DM2 Outpatient Diabetes medications:  U500 60 units in the AM and U500 50 units q PM Current orders for Inpatient glycemic control:  U500 50 units bid with meals Novolog 0-6 units tid with meals  Inpatient Diabetes Program Recommendations:    Note patient only received 3 units of insulin on 11/18/19.    Note U500 restarted today.  Consider reducing to lower dose (U500 15 units bid - with breakfast and supper).    Thanks  Adah Perl, RN, BC-ADM Inpatient Diabetes Coordinator Pager [phone] (8a-5p)

## 2019-11-19 NOTE — Consult Note (Signed)
Pulmonary Medicine          Date: 11/19/2019,   MRN# [id number] Patrick Hurst 1956/02/20     AdmissionWeight: (!) 140 kg                 CurrentWeight: (!) 140 kg   Referring physician: Dr Juleen China   CHIEF COMPLAINT:   Acute on Chronic hypoxemic respiratory failure   HISTORY OF PRESENT ILLNESS   As per admission h/h FINIAN Hurst is a 64 y.o. male with medical history significant for diastolic  CHF, end-stage renal diseasecurrently on peritoneal dialysis, sleep apnea, atrial fibrillation, diabetes who presents with complaints of shortness of breath.He has been having to use nasal cannula oxygen over the last week, which has progressed since past few months. Occasionally he has had chills patient reports he was started on antibiotics by his PCP recently for possible pneumonia however no significant improvement. Does report mild cough. No nausea vomiting or diaphoresis. No chest pain. No sick contacts reported.Pt reports hemoptysis since yesterday and cough. Pt was hypoxic at 89% on ra on arrival and is over 90% on 2 L [place]. Blood pressure 122/71, pulse 91, temperature 97.9 F (36.6 C), temperature source Oral, resp. rate 19, height 5\' 11"  (1.803 m), weight (!) 140 kg, SpO2 93 %.Pt is alert and awake and wife at bedside. Pt is complaint with his providers appts and PD.  Labs in ed show anemia with hb of 6.7 and cxray show pna and was started on Rocephin and azithromycin. Pulmonary consultation for further evaluation and management of pna with increased O2 requirement.     PAST MEDICAL HISTORY   Past Medical History:  Diagnosis Date  . Acute on chronic diastolic CHF (congestive heart failure) ([place]) 06/21/2018  . Acute respiratory failure with hypoxia ([place]) 06/21/2018  . Bell palsy 02/12/2015  . Carpal tunnel syndrome 02/12/2015  . Dependence on nocturnal oxygen therapy    2 LITERS WITH BIPAP  . Depression   . Gastric ulcer   . GERD (gastroesophageal reflux  disease)   . Gout   . History of chicken pox   . Multifocal pneumonia 03/04/2019  . Sleep apnea treated with nocturnal BiPAP      SURGICAL HISTORY   Past Surgical History:  Procedure Laterality Date  . AV FISTULA PLACEMENT Left 11/08/2019   Procedure: ARTERIOVENOUS (AV) FISTULA CREATION (RADIALCEPHALIC);  Surgeon: Algernon Huxley, MD;  Location: [facility] OR[facility];  Service: Vascular;  Laterality: Left;  . CATARACT EXTRACTION Bilateral 2014 and 2015  . CHOLECYSTECTOMY  04/28/2011   Laproscopic; Dr. Pat Patrick  . DIALYSIS/PERMA CATHETER INSERTION N/A 05/24/2019   Procedure: DIALYSIS/PERMA CATHETER INSERTION;  Surgeon: Algernon Huxley, MD;  Location: [facility];  Service: Cardiovascular;  Laterality: N/A;  . DIALYSIS/PERMA CATHETER INSERTION N/A 10/09/2019   Procedure: DIALYSIS/PERMA CATHETER INSERTION;  Surgeon: Katha Cabal, MD;  Location: [facility];  Service: Cardiovascular;  Laterality: N/A;  . EYE SURGERY    . GALLBLADDER SURGERY    . LASIK Bilateral 2019   medical  . SHOULDER SURGERY Right 2012   Dr. Leanor Kail     FAMILY HISTORY   Family History  Problem Relation Age of Onset  . COPD Mother   . Cancer Mother   . Heart disease Father      SOCIAL HISTORY   Social History   Tobacco Use  . Smoking status: Former Smoker    Packs/day: 1.00    Years: 15.00  Pack years: 15.00    Types: Cigarettes    Quit date: 05/03/1978    Years since quitting: 41.5  . Smokeless tobacco: Never Used  . Tobacco comment: started smoking at age 3  Vaping Use  . Vaping Use: Never used  Substance Use Topics  . Alcohol use: No    Alcohol/week: 0.0 standard drinks  . Drug use: No     MEDICATIONS    Home Medication:    Current Medication:  Current Facility-Administered Medications:  .  0.9 %  sodium chloride infusion (Manually program via Guardrails IV Fluids), , Intravenous, Once, Patel, Ekta V, MD .  0.9 %  sodium chloride infusion, 250 mL, Intravenous, PRN, Para Skeans, MD, Last Rate: 10 mL/hr at 11/18/19 1814, 250 mL at 11/18/19 1814 .  0.9 %  sodium chloride infusion, 100 mL, Intravenous, PRN, Lateef, Munsoor, MD .  0.9 %  sodium chloride infusion, 100 mL, Intravenous, PRN, Lateef, Munsoor, MD .  acetaminophen (TYLENOL) tablet 650 mg, 650 mg, Oral, Q6H PRN **OR** acetaminophen (TYLENOL) suppository 650 mg, 650 mg, Rectal, Q6H PRN, Posey Pronto, Ekta V, MD .  albuterol (PROVENTIL) (2.5 MG/3ML) 0.083% nebulizer solution 2.5 mg, 2.5 mg, Nebulization, Q2H PRN, Florina Ou V, MD, 2.5 mg at 11/19/19 0221 .  alteplase (CATHFLO ACTIVASE) injection 2 mg, 2 mg, Intracatheter, Once PRN, Lateef, Munsoor, MD .  amiodarone (PACERONE) tablet 200 mg, 200 mg, Oral, Daily, Florina Ou V, MD, 200 mg at 11/19/19 1003 .  amitriptyline (ELAVIL) tablet 25 mg, 25 mg, Oral, QHS, Para Skeans, MD, 25 mg at 11/18/19 2129 .  apixaban (ELIQUIS) tablet 2.5 mg, 2.5 mg, Oral, BID, Hosie Poisson, MD, 2.5 mg at 11/19/19 1002 .  ascorbic acid (VITAMIN C) tablet 1,000 mg, 1,000 mg, Oral, BID, Florina Ou V, MD, 1,000 mg at 11/19/19 1000 .  aspirin EC tablet 81 mg, 81 mg, Oral, Daily, Florina Ou V, MD, 81 mg at 11/19/19 1003 .  atorvastatin (LIPITOR) tablet 40 mg, 40 mg, Oral, Daily, Florina Ou V, MD, 40 mg at 11/19/19 1003 .  calcitRIOL (ROCALTROL) capsule 0.5 mcg, 0.5 mcg, Oral, Daily, Florina Ou V, MD, 0.5 mcg at 11/19/19 1006 .  cefTRIAXone (ROCEPHIN) 2 g in sodium chloride 0.9 % 100 mL IVPB, 2 g, Intravenous, Q24H, Florina Ou V, MD, Last Rate: 200 mL/hr at 11/18/19 1815, 2 g at 11/18/19 1815 .  Chlorhexidine Gluconate Cloth 2 % PADS 6 each, 6 each, Topical, Q0600, Holley Raring, Munsoor, MD, 6 each at 11/19/19 0543 .  dialysis solution 2.5% low-MG/low-CA dianeal solution, , Intraperitoneal, Q24H, Kolluru, Sarath, MD, 12 L at 11/18/19 2122 .  doxycycline (VIBRA-TABS) tablet 100 mg, 100 mg, Oral, Q12H, Hosie Poisson, MD, 100 mg at 11/19/19 1004 .  febuxostat (ULORIC) tablet 80 mg, 80 mg, Oral,  Daily, Florina Ou V, MD, 80 mg at 11/19/19 1007 .  [START ON 11/21/2019] ferrous sulfate tablet 325 mg, 325 mg, Oral, BID WC, Akula, Vijaya, MD .  fluticasone furoate-vilanterol (BREO ELLIPTA) 100-25 MCG/INH 1 puff, 1 puff, Inhalation, Daily, Para Skeans, MD, 1 puff at 11/19/19 1008 .  furosemide (LASIX) injection 40 mg, 40 mg, Intravenous, Q12H, Hosie Poisson, MD, 40 mg at 11/19/19 0321 .  gentamicin cream (GARAMYCIN) 0.1 % 1 application, 1 application, Topical, Daily, Kolluru, Sarath, MD, 1 application at 17/61/60 2123 .  heparin injection 1,000 Units, 1,000 Units, Dialysis, PRN, Lateef, Munsoor, MD .  hydrALAZINE (APRESOLINE) tablet 10 mg, 10 mg, Oral, TID, Para Skeans, MD, 10  mg at 11/19/19 1008 .  HYDROcodone-acetaminophen (NORCO/VICODIN) 5-325 MG per tablet 1-2 tablet, 1-2 tablet, Oral, Q4H PRN, Florina Ou V, MD .  insulin aspart (novoLOG) injection 0-6 Units, 0-6 Units, Subcutaneous, TID WC, Benita Gutter, RPH, 2 Units at 11/19/19 1231 .  insulin regular human CONCENTRATED (HUMULIN R) 500 UNIT/ML kwikpen 15 Units, 15 Units, Subcutaneous, BID WC, Akula, Vijaya, MD .  ipratropium-albuterol (DUONEB) 0.5-2.5 (3) MG/3ML nebulizer solution 3 mL, 3 mL, Inhalation, Daily, Florina Ou V, MD, 3 mL at 11/19/19 0742 .  lactulose (CHRONULAC) 10 GM/15ML solution 10 g, 10 g, Oral, Daily, Florina Ou V, MD, 10 g at 11/19/19 1005 .  levothyroxine (SYNTHROID) tablet 100 mcg, 100 mcg, Oral, Q0600, Para Skeans, MD, 100 mcg at 11/19/19 0543 .  lidocaine (PF) (XYLOCAINE) 1 % injection 5 mL, 5 mL, Intradermal, PRN, Lateef, Munsoor, MD .  lidocaine-prilocaine (EMLA) cream 1 application, 1 application, Topical, PRN, Lateef, Munsoor, MD .  loratadine (CLARITIN) tablet 10 mg, 10 mg, Oral, Daily, Florina Ou V, MD, 10 mg at 11/19/19 1006 .  metoprolol tartrate (LOPRESSOR) tablet 25 mg, 25 mg, Oral, BID, Florina Ou V, MD, 25 mg at 11/19/19 1008 .  morphine 2 MG/ML injection 2 mg, 2 mg, Intravenous, Q2H PRN,  Para Skeans, MD .  ondansetron [facility]) injection 4 mg, 4 mg, Intravenous, Q6H PRN, Sharion Settler, NP, 4 mg at 11/19/19 0321 .  pantoprazole (PROTONIX) EC tablet 40 mg, 40 mg, Oral, BID, Hosie Poisson, MD, 40 mg at 11/19/19 1006 .  pentafluoroprop-tetrafluoroeth (GEBAUERS) aerosol 1 application, 1 application, Topical, PRN, Lateef, Munsoor, MD .  sodium chloride flush (NS) 0.9 % injection 3 mL, 3 mL, Intravenous, Q12H, Florina Ou V, MD, 3 mL at 11/19/19 1009 .  sodium chloride flush (NS) 0.9 % injection 3 mL, 3 mL, Intravenous, PRN, Para Skeans, MD .  traZODone (DESYREL) tablet 50 mg, 50 mg, Oral, QHS PRN, Sharion Settler, NP, 50 mg at 11/17/19 2210    ALLERGIES   Mucinex [guaifenesin er] and Levaquin [levofloxacin]     REVIEW OF SYSTEMS    Review of Systems:  Gen:  Denies  fever, sweats, chills weigh loss  HEENT: Denies blurred vision, double vision, ear pain, eye pain, hearing loss, nose bleeds, sore throat Cardiac:  No dizziness, chest pain or heaviness, chest tightness,edema Resp:   Denies cough or sputum porduction, shortness of breath,wheezing, hemoptysis,  Gi: Denies swallowing difficulty, stomach pain, nausea or vomiting, diarrhea, constipation, bowel incontinence Gu:  Denies bladder incontinence, burning urine Ext:   Denies Joint pain, stiffness or swelling Skin: Denies  skin rash, easy bruising or bleeding or hives Endoc:  Denies polyuria, polydipsia , polyphagia or weight change Psych:   Denies depression, insomnia or hallucinations   Other:  All other systems negative   VS: BP 138/66 (BP Location: Right Arm)   Pulse 70   Temp 98.1 F (36.7 C)   Resp 18   Ht 5\' 11"  (1.803 m)   Wt (!) 140 kg   SpO2 94%   BMI 43.05 kg/m      PHYSICAL EXAM    GENERAL:NAD, no fevers, chills, no weakness no fatigue HEAD: Normocephalic, atraumatic.  EYES: Pupils equal, round, reactive to light. Extraocular muscles intact. No scleral icterus.  MOUTH: Moist mucosal  membrane. Dentition intact. No abscess noted.  EAR, NOSE, THROAT: Clear without exudates. No external lesions.  NECK: Supple. No thyromegaly. No nodules. No JVD.  PULMONARY: Diffuse coarse rhonchi right sided +wheezes CARDIOVASCULAR:  S1 and S2. Regular rate and rhythm. No murmurs, rubs, or gallops. No edema. Pedal pulses 2+ bilaterally.  GASTROINTESTINAL: Soft, nontender, nondistended. No masses. Positive bowel sounds. No hepatosplenomegaly.  MUSCULOSKELETAL: No swelling, clubbing, or edema. Range of motion full in all extremities.  NEUROLOGIC: Cranial nerves II through XII are intact. No gross focal neurological deficits. Sensation intact. Reflexes intact.  SKIN: No ulceration, lesions, rashes, or cyanosis. Skin warm and dry. Turgor intact.  PSYCHIATRIC: Mood, affect within normal limits. The patient is awake, alert and oriented x 3. Insight, judgment intact.       IMAGING    DG Chest 2 View  Result Date: 11/18/2019 CLINICAL DATA:  Acute respiratory failure with hypoxia. EXAM: CHEST - 2 VIEW COMPARISON:  November 15, 2019. FINDINGS: The heart size and mediastinal contours are within normal limits. No pneumothorax or pleural effusion is noted. Right internal jugular dialysis catheter is unchanged in position. Bilateral perihilar opacities are noted, left greater than right, consistent with pneumonia. The visualized skeletal structures are unremarkable. IMPRESSION: Grossly stable bilateral perihilar opacities, left greater than right, consistent with pneumonia. Electronically Signed   By: Marijo Conception M.D.   On: 11/18/2019 16:11   DG Chest 2 View  Result Date: 11/15/2019 CLINICAL DATA:  Short of breath, pneumonia EXAM: CHEST - 2 VIEW COMPARISON:  11/12/2019 FINDINGS: Frontal and lateral views of the chest demonstrate persistent bilateral perihilar airspace disease, slightly improved on the right since prior study but with significant progression on the left. There is no effusion or pneumothorax.  Cardiac silhouette is enlarged but stable. Stable right internal jugular dialysis catheter. No acute bony abnormalities. IMPRESSION: 1. Bilateral perihilar airspace disease, left greater than right. There has been progression in the left since prior study. Differential includes bilateral pneumonia versus asymmetric edema. Electronically Signed   By: Randa Ngo M.D.   On: 11/15/2019 15:37   DG Chest 2 View  Result Date: 11/12/2019 CLINICAL DATA:  Shortness of breath for 4-5 days. EXAM: CHEST - 2 VIEW COMPARISON:  Single-view of the chest 03/07/2019. FINDINGS: Patchy airspace disease in the mid and lower lung zones bilaterally is worse on the left. Heart size is normal. No pneumothorax or pleural effusion. Right IJ approach dialysis catheter noted. IMPRESSION: Patchy bilateral airspace disease consistent with pneumonia. Electronically Signed   By: Inge Rise M.D.   On: 11/12/2019 15:34   VAS [place] UPPER EXTREMITY ARTERIAL DUPLEX  Result Date: 11/09/2019 UPPER EXTREMITY DUPLEX STUDY Performing Technologist: Concha Norway RVT  Examination Guidelines: A complete evaluation includes B-mode imaging, spectral Doppler, color Doppler, and power Doppler as needed of all accessible portions of each vessel. Bilateral testing is considered an integral part of a complete examination. Limited examinations for reoccurring indications may be performed as noted.  Right Pre-Dialysis Findings: +-----------------------+----------+--------------------+---------+--------+ Location               PSV (cm/s)Intralum. Diam. (cm)Waveform Comments +-----------------------+----------+--------------------+---------+--------+ Brachial Antecub. fossa120       0.61                triphasic         +-----------------------+----------+--------------------+---------+--------+ Radial Art at Wrist    92        0.25                triphasic         +-----------------------+----------+--------------------+---------+--------+  Ulnar Art at Wrist     119       0.20  triphasic         +-----------------------+----------+--------------------+---------+--------+ Left Pre-Dialysis Findings: +-----------------------+----------+--------------------+---------+--------+ Location               PSV (cm/s)Intralum. Diam. (cm)Waveform Comments +-----------------------+----------+--------------------+---------+--------+ Brachial Antecub. fossa130       0.66                triphasic         +-----------------------+----------+--------------------+---------+--------+ Radial Art at Wrist    94        0.33                triphasic         +-----------------------+----------+--------------------+---------+--------+ Ulnar Art at Wrist     131       0.22                triphasic         +-----------------------+----------+--------------------+---------+--------+  Summary:  Right: No obstruction visualized in the right upper extremity. Left: No obstruction visualized in the left upper extremity. *See table(s) above for measurements and observations. Electronically signed by Leotis Pain MD on 11/09/2019 at 9:02:37 AM.    Final    ECHOCARDIOGRAM COMPLETE  Result Date: 11/16/2019    ECHOCARDIOGRAM REPORT   Patient Name:   SACRAMENTO MONDS Date of Exam: 11/16/2019 Medical Rec #:  [id number]      Height:       71.0 in Accession #:    [id number]     Weight:       308.6 lb Date of Birth:  1955-05-17      BSA:          2.536 m Patient Age:    66 years       BP:           128/55 mmHg Patient Gender: M              HR:           74 bpm. Exam Location:  [facility] Procedure: 2D Echo, Cardiac Doppler and Color Doppler Indications:     Dyspnea 786.09  History:         Patient has prior history of Echocardiogram examinations, most                  recent 03/06/2019. CHF; Risk Factors:Sleep Apnea.  Sonographer:     Sherrie Sport RDCS (AE) Referring Phys:  Theola Sequin Diagnosing Phys: Ida Rogue MD IMPRESSIONS  1. Left ventricular  ejection fraction, by estimation, is 60 to 65%. The left ventricle has normal function. The left ventricle has no regional wall motion abnormalities. There is mild left ventricular hypertrophy. Left ventricular diastolic parameters are consistent with Grade II diastolic dysfunction (pseudonormalization).  2. Right ventricular systolic function is normal. The right ventricular size is normal. There is normal pulmonary artery systolic pressure. The estimated right ventricular systolic pressure is 54.6 mmHg.  3. The aortic valve is moderately calcified. Mild to moderate aortic valve sclerosis/calcification is present, without any evidence of aortic stenosis.  4. Left atrial size was mildly dilated. FINDINGS  Left Ventricle: Left ventricular ejection fraction, by estimation, is 60 to 65%. The left ventricle has normal function. The left ventricle has no regional wall motion abnormalities. The left ventricular internal cavity size was normal in size. There is  mild left ventricular hypertrophy. Left ventricular diastolic parameters are consistent with Grade II diastolic dysfunction (pseudonormalization). Right Ventricle: The right ventricular size is normal. No increase in right ventricular wall thickness. Right ventricular  systolic function is normal. There is normal pulmonary artery systolic pressure. The tricuspid regurgitant velocity is 1.97 m/s, and  with an assumed right atrial pressure of 10 mmHg, the estimated right ventricular systolic pressure is 40.9 mmHg. Left Atrium: Left atrial size was mildly dilated. Right Atrium: Right atrial size was normal in size. Pericardium: There is no evidence of pericardial effusion. Mitral Valve: The mitral valve is normal in structure. Normal mobility of the mitral valve leaflets. Mild mitral annular calcification. Mild mitral valve regurgitation. No evidence of mitral valve stenosis. Tricuspid Valve: The tricuspid valve is normal in structure. Tricuspid valve regurgitation is  not demonstrated. No evidence of tricuspid stenosis. Aortic Valve: The aortic valve is normal in structure. Aortic valve regurgitation is not visualized. Mild to moderate aortic valve sclerosis/calcification is present, without any evidence of aortic stenosis. Aortic valve mean gradient measures 8.5 mmHg. Aortic valve peak gradient measures 16.6 mmHg. Aortic valve area, by VTI measures 2.29 cm. Pulmonic Valve: The pulmonic valve was normal in structure. Pulmonic valve regurgitation is not visualized. No evidence of pulmonic stenosis. Aorta: The aortic root is normal in size and structure. Venous: The inferior vena cava is normal in size with greater than 50% respiratory variability, suggesting right atrial pressure of 3 mmHg. IAS/Shunts: No atrial level shunt detected by color flow Doppler.  LEFT VENTRICLE PLAX 2D LVIDd:         6.18 cm  Diastology LVIDs:         3.40 cm  LV e' lateral:   14.70 cm/s LV PW:         1.54 cm  LV E/e' lateral: 7.7 LV IVS:        0.93 cm  LV e' medial:    5.44 cm/s LVOT diam:     2.30 cm  LV E/e' medial:  20.8 LV SV:         97 LV SV Index:   38 LVOT Area:     4.15 cm  RIGHT VENTRICLE RV Basal diam:  3.25 cm RV S prime:     18.40 cm/s TAPSE (M-mode): 3.7 cm LEFT ATRIUM            Index       RIGHT ATRIUM           Index LA diam:      5.20 cm  2.05 cm/m  RA Area:     25.00 cm LA Vol (A2C): 187.0 ml 73.75 ml/m RA Volume:   79.50 ml  31.35 ml/m LA Vol (A4C): 112.0 ml 44.17 ml/m  AORTIC VALVE                    PULMONIC VALVE AV Area (Vmax):    2.10 cm     PV Vmax:        1.01 m/s AV Area (Vmean):   2.07 cm     PV Peak grad:   4.1 mmHg AV Area (VTI):     2.29 cm     RVOT Peak grad: 8 mmHg AV Vmax:           203.50 cm/s AV Vmean:          137.500 cm/s AV VTI:            0.422 m AV Peak Grad:      16.6 mmHg AV Mean Grad:      8.5 mmHg LVOT Vmax:         103.00 cm/s LVOT Vmean:  68.500 cm/s LVOT VTI:          0.233 m LVOT/AV VTI ratio: 0.55  AORTA Ao Root diam: 3.70 cm MITRAL  VALVE                TRICUSPID VALVE MV Area (PHT): 3.20 cm     TR Peak grad:   15.5 mmHg MV Decel Time: 237 msec     TR Vmax:        197.00 cm/s MV E velocity: 113.00 cm/s MV A velocity: 100.00 cm/s  SHUNTS MV E/A ratio:  1.13         Systemic VTI:  0.23 m                             Systemic Diam: 2.30 cm Ida Rogue MD Electronically signed by Ida Rogue MD Signature Date/Time: 11/16/2019/2:31:17 PM    Final    VAS [place] UPPER EXT VEIN MAPPING (PRE-OP AVF)  Result Date: 11/09/2019 UPPER EXTREMITY VEIN MAPPING  Indications: Pre-access. History: Esrd.  Performing Technologist: Concha Norway RVT  Examination Guidelines: A complete evaluation includes B-mode imaging, spectral Doppler, color Doppler, and power Doppler as needed of all accessible portions of each vessel. Bilateral testing is considered an integral part of a complete examination. Limited examinations for reoccurring indications may be performed as noted. +-----------------+-------------+----------+---------+ Right Cephalic   Diameter (cm)Depth (cm)Findings  +-----------------+-------------+----------+---------+ Prox upper arm       0.54                         +-----------------+-------------+----------+---------+ Mid upper arm        0.56               branching +-----------------+-------------+----------+---------+ Dist upper arm       0.52                         +-----------------+-------------+----------+---------+ Antecubital fossa    0.56                         +-----------------+-------------+----------+---------+ Prox forearm         0.82                         +-----------------+-------------+----------+---------+ Mid forearm          0.47               branching +-----------------+-------------+----------+---------+ Dist forearm         0.38                         +-----------------+-------------+----------+---------+ +-----------------+-------------+----------+--------+ Right Basilic     Diameter (cm)Depth (cm)Findings +-----------------+-------------+----------+--------+ Prox upper arm       0.72                        +-----------------+-------------+----------+--------+ Mid upper arm        0.45                        +-----------------+-------------+----------+--------+ Dist upper arm       0.45                        +-----------------+-------------+----------+--------+ Antecubital fossa    0.44                        +-----------------+-------------+----------+--------+  Prox forearm         0.43                        +-----------------+-------------+----------+--------+ +-----------------+-------------+----------+--------+ Left Cephalic    Diameter (cm)Depth (cm)Findings +-----------------+-------------+----------+--------+ Prox upper arm       0.60                        +-----------------+-------------+----------+--------+ Mid upper arm        0.50                        +-----------------+-------------+----------+--------+ Dist upper arm       0.48                        +-----------------+-------------+----------+--------+ Antecubital fossa    0.73                        +-----------------+-------------+----------+--------+ Prox forearm         0.44                        +-----------------+-------------+----------+--------+ Mid forearm          0.49                        +-----------------+-------------+----------+--------+ Dist forearm         0.41                        +-----------------+-------------+----------+--------+ +-----------------+-------------+----------+--------+ Left Basilic     Diameter (cm)Depth (cm)Findings +-----------------+-------------+----------+--------+ Prox upper arm       0.64                        +-----------------+-------------+----------+--------+ Mid upper arm        0.57                        +-----------------+-------------+----------+--------+ Dist upper arm        0.67                        +-----------------+-------------+----------+--------+ Antecubital fossa    0.62                        +-----------------+-------------+----------+--------+ Prox forearm         0.43                        +-----------------+-------------+----------+--------+ Summary: Right: Normal caliber and compressible veins throughout. Left: Normal caliber and compressible veins throughout. *See table(s) above for measurements and observations.  Diagnosing physician: Leotis Pain MD Electronically signed by Leotis Pain MD on 11/09/2019 at 9:02:34 AM.    Final       ASSESSMENT/PLAN   Acute on chronic hypoxemic respiratory failure -due to CAP present on admission - possibly due to bacteria such as strep pneumo vs viral etiology - will place procal trending, RVP stat, nasal MRSA pcr -repeat respiratory cultures -COVID negative x 6 over past 1 year - cardio/renal/hospitalist service on case - appreciate collaboration  - Rocephin/doxy -CT chest ordered  Acute blood loss anemia  - hb<7  - s/p transfusion   - monitoring h/h  - contributing to hypoxemia  - possible  blood loss related to intrapulmonary process  - monitor for hemoptysis   COPD -agree with current care path  - will add MetaNEB therapy for aggressive BPH    Thank you for allowing me to participate in the care of this patient.    Patient/Family are satisfied with care plan and all questions have been answered.   This document was prepared using Dragon voice recognition software and may include unintentional dictation errors.     Ottie Glazier, M.D.  Division of [place]

## 2019-11-19 NOTE — H&P (Signed)
[facility] History & Physical Update  See my office note from November 02, 2019.  The patient was interviewed and re-examined.  The patient's previous History and Physical has been reviewed and is unchanged.  There is no change in the plan of care. We plan to proceed with the scheduled procedure.  Leotis Pain, MD  11/19/2019, 8:48 AM

## 2019-11-19 NOTE — TOC Initial Note (Signed)
Transition of Care [facility]) - Initial/Assessment Note    Patient Details  Name: Patrick Hurst MRN: [id number] Date of Birth: 1955-05-11  Transition of Care [facility]) CM/SW Contact:    Shelbie Hutching, RN Phone Number: 11/19/2019, 1:15 PM  Clinical Narrative:                 Patient admitted to the hospital with respiratory failure with hypoxia related to pneumonia.  Patient is end stage renal disease on peritoneal dialysis at home daily.  Patient lives with his wife and is independent at home.  Patient works full time as a Architectural technologist.  Patient has reliable transportation and drives.  He wears oxygen only at night 4L with his bipap.  PT has recommended outpatient physical therapy and the patient is in agreement with this and chooses to come to [facility].  Outpatient referral will be faxed to [facility] OP therapy department.    Patient is current with his PCP Dr. Caryn Section and gets his prescriptions from [facility].    Expected Discharge Plan: OP Rehab Barriers to Discharge: Continued Medical Work up   Patient Goals and CMS Choice Patient states their goals for this hospitalization and ongoing recovery are:: get to feeling better      Expected Discharge Plan and Services Expected Discharge Plan: OP Rehab   Discharge Planning Services: CM Consult   Living arrangements for the past 2 months: Mobile Home                                      Prior Living Arrangements/Services Living arrangements for the past 2 months: Mobile Home Lives with:: Spouse Patient language and need for interpreter reviewed:: Yes Do you feel safe going back to the place where you live?: Yes      Need for Family Participation in Patient Care: Yes (Comment) (pneumonia, peritoneal dialysis) Care giver support system in place?: Yes (comment) Current home services: DME (cane, lift chair, bedside commode) Criminal Activity/Legal Involvement Pertinent to Current Situation/Hospitalization: No - Comment as  needed  Activities of Daily Living Home Assistive Devices/Equipment: Eyeglasses ADL Screening (condition at time of admission) Patient's cognitive ability adequate to safely complete daily activities?: Yes Is the patient deaf or have difficulty hearing?: No Does the patient have difficulty seeing, even when wearing glasses/contacts?: No Does the patient have difficulty concentrating, remembering, or making decisions?: No Patient able to express need for assistance with ADLs?: Yes Does the patient have difficulty dressing or bathing?: No Independently performs ADLs?: Yes (appropriate for developmental age) Does the patient have difficulty walking or climbing stairs?: No Weakness of Legs: None Weakness of Arms/Hands: None  Permission Sought/Granted Permission sought to share information with : Case Manager, Family Supports, Chartered certified accountant granted to share information with : Yes, Verbal Permission Granted  Share Information with NAME: Hilda Blades  Permission granted to share info w AGENCY: [facility] OP Therapy  Permission granted to share info w Relationship: wife     Emotional Assessment Appearance:: Appears stated age Attitude/Demeanor/Rapport: Engaged Affect (typically observed): Accepting Orientation: : Oriented to Self, Oriented to Place, Oriented to  Time, Oriented to Situation Alcohol / Substance Use: Not Applicable Psych Involvement: No (comment)  Admission diagnosis:  CAP (community acquired pneumonia) [J18.9] Acute respiratory failure with hypoxia (HCC) [J96.01] Anemia of chronic disease [D63.8] Community acquired pneumonia, unspecified laterality [J18.9] Patient Active Problem List   Diagnosis Date Noted  . CAP (  community acquired pneumonia) 11/15/2019  . Dependence on nocturnal oxygen therapy   . Vitamin D deficiency   . ESRD on dialysis ([place]) 06/01/2019  . History of atrial fibrillation   . Respiratory failure with hypoxia ([place]) 03/01/2019  .  COPD (chronic obstructive pulmonary disease) ([address]) 12/08/2018  . Diabetic retinopathy of both eyes without macular edema associated with type 2 diabetes mellitus ([place]) 10/20/2018  . Secondary hyperparathyroidism of renal origin ([place]) 06/20/2018  . Chronic diastolic CHF (congestive heart failure) ([place]) 02/27/2018  . Psoriasis (a type of skin inflammation) 10/19/2016  . Primary osteoarthritis of both knees 10/01/2015  . Obesity, Class III, BMI 40-49.9 (morbid obesity) ([place]) 10/01/2015  . Splenomegaly 09/19/2015  . ANA positive 07/21/2015  . Hepatic fibrosis 04/16/2015  . Charcot ankle 04/02/2015  . Thrombocytopenia ([place]) 02/14/2015  . Allergic rhinitis 02/12/2015  . Clinical depression 02/12/2015  . Diabetes mellitus with neuropathy ([place]) 02/12/2015  . Erectile dysfunction 02/12/2015  . HLD (hyperlipidemia) 02/12/2015  . Hypertension 02/12/2015  . Hypothyroidism 02/12/2015  . Anemia, iron deficiency 02/12/2015  . Low testosterone 02/12/2015  . Peripheral neuropathy 02/12/2015  . Obstructive sleep apnea 02/12/2015  . Gout 05/05/2012   PCP:  [name], MD Pharmacy:   [facility] [address] [place] [address] [place]) [place] [address] Phone: [phone] Fax: [phone]  [facility] [place], [place] [address] Phone: [phone] Fax: [phone]  [facility] [place], [place] [address] [place] [address] Phone: [phone] Fax: [phone]     Social Determinants of Health (SDOH) Interventions    Readmission Risk Interventions Readmission Risk Prevention Plan 11/19/2019  Transportation Screening Complete  PCP or Specialist Appt within 3-5 Days Complete  HRI or [place] Complete  Social Work Consult for [place] Planning/Counseling Complete  Palliative Care  Screening Not Applicable  Medication Review Press photographer) Complete  Some recent data might be hidden

## 2019-11-19 NOTE — Progress Notes (Signed)
Pd completed, no complaints of last nights treatment.

## 2019-11-19 NOTE — Progress Notes (Signed)
[facility]  ROUNDING NOTE   Subjective:   Peritoneal dialysis last night. Tolerated treatment well. UF of 1172mL.  Wife at bedside. Patient states he is feeling better but still short of breath.    Objective:  Vital signs in last 24 hours:  Temp:  [97.3 F (36.3 C)-98.8 F (37.1 C)] 97.3 F (36.3 C) (07/19 0911) Pulse Rate:  [61-69] 64 (07/19 0911) Resp:  [15-20] 19 (07/19 0911) BP: (126-155)/(50-78) 137/62 (07/19 0911) SpO2:  [89 %-97 %] 91 % (07/19 1001)  Weight change:  Filed Weights   11/15/19 1500  Weight: (!) 140 kg    Intake/Output: I/O last 3 completed shifts: In: 240 [P.O.:240] Out: 2499 [Urine:900; OHYWV:3710]   Intake/Output this shift:  Total I/O In: 120 [P.O.:120] Out: 1122 [Other:1122]  Physical Exam: General: No acute distress  Head: Normocephalic, atraumatic. Moist oral mucosal membranes  Eyes: Anicteric  Neck: Supple, trachea midline  Lungs:  Diminished on left, 3L [place] O2  Heart: tachycardia  Abdomen:  Soft, nontender, bowel sounds present  Extremities: no peripheral edema.  Neurologic: Awake, alert, following commands  Skin: No lesions  Access: PD catheter in place, right IJ PermCath in place, developing left upper extremity AV access    Basic Metabolic Panel: Recent Labs  Lab 11/15/19 1504 11/15/19 1504 11/16/19 0159 11/16/19 0159 11/17/19 0315 11/17/19 2035 11/18/19 0357 11/19/19 0843  NA 134*  --  137  --  138  --  136 137  K 5.1  --  5.1  --  4.2  --  4.0 4.5  CL 93*  --  95*  --  95*  --  94* 94*  CO2 19*  --  22  --  27  --  24 25  GLUCOSE 295*  --  201*  --  139*  --  169* 243*  BUN 124*  --  109*  --  77*  --  89* 91*  CREATININE 13.21*  --  13.08*  --  8.22*  --  9.38* 9.57*  CALCIUM 7.9*   < > 8.2*   < > 8.4*  --  8.3* 9.1  PHOS  --   --   --   --   --  9.6*  --   --    < > = values in this interval not displayed.    Liver Function Tests: Recent Labs  Lab 11/16/19 0159 11/19/19 0843  AST 25 22   ALT 28 30  ALKPHOS 207* 250*  BILITOT 0.7 0.7  PROT 7.6 7.9  ALBUMIN 3.1* 3.3*   No results for input(s): LIPASE, AMYLASE in the last 168 hours. No results for input(s): AMMONIA in the last 168 hours.  CBC: Recent Labs  Lab 11/15/19 1504 11/16/19 0159 11/17/19 0315 11/18/19 0357 11/19/19 0843  WBC 8.3 7.3 7.3 7.8 6.3  NEUTROABS  --  5.2  --   --   --   HGB 6.4* 6.6* 7.3* 7.5* 8.0*  HCT 19.6* 20.6* 21.6* 22.0* 24.0*  MCV 95.1 95.4 90.8 89.4 91.6  PLT 120* 137* 138* 129* 125*    Cardiac Enzymes: No results for input(s): CKTOTAL, CKMB, CKMBINDEX, TROPONINI in the last 168 hours.  BNP: Invalid input(s): POCBNP  CBG: Recent Labs  Lab 11/18/19 0756 11/18/19 1247 11/18/19 1646 11/18/19 2043 11/19/19 0913  GLUCAP 179* 236* 138* 242* 219*    Microbiology: Results for orders placed or performed during the hospital encounter of 11/15/19  Blood Culture (routine x 2)  Status: None (Preliminary result)   Collection Time: 11/15/19  5:28 PM   Specimen: BLOOD  Result Value Ref Range Status   Specimen Description BLOOD BLOOD RIGHT FOREARM  Final   Special Requests   Final    BOTTLES DRAWN AEROBIC AND ANAEROBIC Blood Culture adequate volume   Culture   Final    NO GROWTH 4 DAYS Performed at [facility], [address] [place], [place] [address]    Report Status PENDING  Incomplete  Blood Culture (routine x 2)     Status: None (Preliminary result)   Collection Time: 11/15/19  5:28 PM   Specimen: BLOOD  Result Value Ref Range Status   Specimen Description BLOOD RIGHT ANTECUBITAL  Final   Special Requests   Final    BOTTLES DRAWN AEROBIC AND ANAEROBIC Blood Culture results may not be optimal due to an inadequate volume of blood received in culture bottles   Culture   Final    NO GROWTH 4 DAYS Performed at [facility], [address] [place], [place] [address]    Report Status PENDING  Incomplete  SARS Coronavirus 2 by RT PCR (hospital order,  performed in [place] hospital lab) Nasopharyngeal Nasopharyngeal Swab     Status: None   Collection Time: 11/15/19  7:14 PM   Specimen: Nasopharyngeal Swab  Result Value Ref Range Status   SARS Coronavirus 2 NEGATIVE NEGATIVE Final    Comment: (NOTE) SARS-CoV-2 target nucleic acids are NOT DETECTED.  The SARS-CoV-2 RNA is generally detectable in upper and lower respiratory specimens during the acute phase of infection. The lowest concentration of SARS-CoV-2 viral copies this assay can detect is 250 copies / mL. A negative result does not preclude SARS-CoV-2 infection and should not be used as the sole basis for treatment or other patient management decisions.  A negative result may occur with improper specimen collection / handling, submission of specimen other than nasopharyngeal swab, presence of viral mutation(s) within the areas targeted by this assay, and inadequate number of viral copies (<250 copies / mL). A negative result must be combined with clinical observations, patient history, and epidemiological information.  Fact Sheet for Patients:   StrictlyIdeas.no  Fact Sheet for Healthcare Providers: BankingDealers.co.za  This test is not yet approved or  cleared by the [place] FDA and has been authorized for detection and/or diagnosis of SARS-CoV-2 by FDA under an Emergency Use Authorization (EUA).  This EUA will remain in effect (meaning this test can be used) for the duration of the COVID-19 declaration under Section 564(b)(1) of the Act, 21 U.S.C. section 360bbb-3(b)(1), unless the authorization is terminated or revoked sooner.  Performed at [facility], [address] [place], [place] [address]     Coagulation Studies: No results for input(s): LABPROT, INR in the last 72 hours.  Urinalysis: No results for input(s): COLORURINE, LABSPEC, PHURINE, GLUCOSEU, HGBUR, BILIRUBINUR, KETONESUR, PROTEINUR,  UROBILINOGEN, NITRITE, LEUKOCYTESUR in the last 72 hours.  Invalid input(s): APPERANCEUR    Imaging: DG Chest 2 View  Result Date: 11/18/2019 CLINICAL DATA:  Acute respiratory failure with hypoxia. EXAM: CHEST - 2 VIEW COMPARISON:  November 15, 2019. FINDINGS: The heart size and mediastinal contours are within normal limits. No pneumothorax or pleural effusion is noted. Right internal jugular dialysis catheter is unchanged in position. Bilateral perihilar opacities are noted, left greater than right, consistent with pneumonia. The visualized skeletal structures are unremarkable. IMPRESSION: Grossly stable bilateral perihilar opacities, left greater than right, consistent with pneumonia. Electronically  Signed   By: Marijo Conception M.D.   On: 11/18/2019 16:11     Medications:   . sodium chloride 250 mL (11/18/19 1814)  . sodium chloride    . sodium chloride    . cefTRIAXone (ROCEPHIN)  IV 2 g (11/18/19 1815)  . dialysis solution 2.5% low-MG/low-CA     . sodium chloride   Intravenous Once  . amiodarone  200 mg Oral Daily  . amitriptyline  25 mg Oral QHS  . apixaban  2.5 mg Oral BID  . vitamin C  1,000 mg Oral BID  . aspirin EC  81 mg Oral Daily  . atorvastatin  40 mg Oral Daily  . calcitRIOL  0.5 mcg Oral Daily  . Chlorhexidine Gluconate Cloth  6 each Topical Q0600  . doxycycline  100 mg Oral Q12H  . febuxostat  80 mg Oral Daily  . [START ON 11/21/2019] ferrous sulfate  325 mg Oral BID WC  . fluticasone furoate-vilanterol  1 puff Inhalation Daily  . furosemide  40 mg Intravenous Q12H  . gentamicin cream  1 application Topical Daily  . hydrALAZINE  10 mg Oral TID  . insulin aspart  0-6 Units Subcutaneous TID WC  . insulin regular human CONCENTRATED  15 Units Subcutaneous BID WC  . ipratropium-albuterol  3 mL Inhalation Daily  . lactulose  10 g Oral Daily  . levothyroxine  100 mcg Oral Q0600  . loratadine  10 mg Oral Daily  . metoprolol tartrate  25 mg Oral BID  . pantoprazole  40 mg  Oral BID  . sodium chloride flush  3 mL Intravenous Q12H   sodium chloride, sodium chloride, sodium chloride, acetaminophen **OR** acetaminophen, albuterol, alteplase, heparin, HYDROcodone-acetaminophen, lidocaine (PF), lidocaine-prilocaine, morphine injection, ondansetron (ZOFRAN) IV, pentafluoroprop-tetrafluoroeth, sodium chloride flush, traZODone  Assessment/ Plan:   [name] is a 64 y.o. white male with end stage renal disease on peritoneal dialysis, chronic diastolic heart failure, Bell's palsy, carpal tunnel syndrome, depression, GERD, gout, who is admitted to [facility] on 11/15/2019 for CAP (community acquired pneumonia) [J18.9] Acute respiratory failure with hypoxia ([place]) [J96.01] Anemia of chronic disease [D63.8] Community acquired pneumonia, unspecified laterality [J18.9]  [facility] 139kg CCPD 9 hours 4 exchanges  1. End stage renal disease: on peritoneal dialysis. Back up hemodialysis treatment 7/16.   Did not make adequacy this month and his prescription is to be changed.  - peritoneal dialysis treatment for tonight. Orders prepared.   2.  Anemia of chronic kidney disease status post PRBC transfusion on 7/16. No ESA on this admission. Will see if he has any outpatient epo due for this week.   3. Secondary hyperparathyroidism with hyperphosphatemia: outpatient labs on 7/12: PTH 335, calcium 8, phos 13.3.   - calcitriol  - not currently binders  4. Hypertension: well controlled. Currently on IV furosemide, hydralazine and metoprolol.  Home regimen includes amlodipine and torsemide which are currently being held.    LOS: 4 Daishon Chui 7/19/202111:08 AM

## 2019-11-19 NOTE — Progress Notes (Signed)
Pd started 

## 2019-11-19 NOTE — Progress Notes (Signed)
PROGRESS NOTE    Patrick Hurst  XLK:[id number] DOB: January 23, 1956 DOA: 11/15/2019 PCP: [name], MD    Chief Complaint  Patient presents with  . Shortness of Breath    Brief Narrative:  64 year old with prior history of diastolic heart failure, end-stage renal disease on PD, does require intermittent HD, paroxysmal atrial fibrillation, anemia of chronic disease gallop, depression, Bell's palsy, carpal tunnel syndrome, Hyperlipidemia, hypertension, DM, COPD, OHS, OSA on BIPAP at night, Hypothyroidism, presents to ED, with SOB.  Patient was admitted for acute respiratory failure with hypoxia secondary to a combination of bilateral pneumonia and fluid overload from inadequate peritoneal dialysis. Patient seen and examined this morning reports he had an episode of nausea and was given Zofran.  He denies any vomiting at this time.  Under mL in the last 24 hours.   Assessment & Plan:   Principal Problem:   Respiratory failure with hypoxia (HCC) Active Problems:   Diabetes mellitus with neuropathy (HCC)   Hypertension   Hypothyroidism   Anemia, iron deficiency   Chronic diastolic CHF (congestive heart failure) (HCC)   COPD (chronic obstructive pulmonary disease) (HCC)   ESRD on dialysis ([place])   CAP (community acquired pneumonia)    Acute respiratory failure with hypoxia probably secondary to combination of bilateral pneumonia  And fluid overload from inadequate peritoneal dialysis and acute on chronic diastolic heart failure.  Patient currently is down to 2 L of nasal cannula oxygen to keep sats greater than 90% He was started on IV Rocephin and Zithromax for the community-acquired pneumonia, continue the same and repeat chest x-ray showed persistent bilateral infiltrates consistent with pneumonia. Blood cultures have been negative so far. Started the patient on IV lasix 40 mg BID ,  Urine output around 400 ml yesterday.   Strict intake and output and daily weights.    Echocardiogram showed persevered left ventricular ejection fracture and grade 2 diastolic dysfunction. Discussed the results with the patient.   Nephrology consulted for HD, underwent  HD session on 11/16/19 and 3.5 lit of fluid removed followed by PD everyday.   He will probably need another HD session, if he continues to require [place] oxygen   PT evaluation today , pt felt dizziness, and required oxygen upto 3 lit/ min on ambulation.   Anemia of chronic disease:  Hemoglobin at baseline between 8 to 9. On admission hemoglobin around 6.6. s/p 1 unit of prbc transfusion ordered.  Hemoglobin improved to 8. No h/o rectal bleeding or melena.  Stool for occult blood ordered , pending.  Anemia panel ordered for further evaluation showed iron level of 32, ferritin of 627, folate of 14.8 and vitamin B 12 of  428.  Iron supplementation on discharge.    ESRD on PD and on HD intermittently.  Appreciate nephrology recommendations.  Patient will probably need another hemodialysis session if his hypoxia does not improve.  Plan for PD tonight.     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 CBG (last 3)  Recent Labs    11/18/19 1646 11/18/19 2043 11/19/19 0913  GLUCAP 138* 242* 219*    A1c is 5.8 Restarted home meds and SSI.  Uncontrolled with hyperglycemia.     Essential hypertension:  Well controlled.    OHS and OSA on BIPAP at night  Continue the same.    Body mass index is 43.05 kg/m. Morbid obesity Poor prognostic factor.     Hypothyroidism:  Resume synthroid.     Hyperlipidemia:  Resume statin.  PAF:  Rate controlled with amiodarone.  No evidence of obvious bleeding, will restart him on Eliquis.     COPD: Diminished air entry at bases.  No wheezing heard today.  Resume bronchodilators    DVT prophylaxis: Eliquis  code Status: Full code Family Communication: family  at bedside Disposition:   Status is: Inpatient  Remains  inpatient appropriate because:IV treatments appropriate due to intensity of illness or inability to take PO and Inpatient level of care appropriate due to severity of illness   Dispo: The patient is from: Home              Anticipated d/c is to: Pending              Anticipated d/c date is: 2 days              Patient currently is not medically stable to d/c.       Consultants:   Nephrology  Procedures: Hemodialysis  Antimicrobials: Rocephin and Zithromax since admission   Subjective: Pt reports being nauseated, no vomiting, no chest pain or sob.    Objective: Vitals:   11/19/19 0400 11/19/19 0742 11/19/19 0911 11/19/19 1001  BP: (!) 146/55  137/62   Pulse: 63  64   Resp: 15  19   Temp:   (!) 97.3 F (36.3 C)   TempSrc:   Oral   SpO2: 92% 96% 92% 91%  Weight:      Height:        Intake/Output Summary (Last 24 hours) at 11/19/2019 1013 Last data filed at 11/19/2019 0904 Gross per 24 hour  Intake 240 ml  Output 1522 ml  Net -1282 ml   Filed Weights   11/15/19 1500  Weight: (!) 140 kg    Examination:  General exam alert and comfortable, not in any kind of distress on 3 L of nasal cannula oxygen. Respiratory system: Diminished air entry at bases, no wheezing or rhonchi.  Cardiovascular system: S1S2, RRR, no JVD, trace leg edema.  Gastrointestinal system: abd is soft, non tender bowel sounds wnl.  Central nervous system: Alert and oriented, grossly nonfocal Extremities: No cyanosis or clubbing Skin: no rashes seen.  Psychiatry:  Mood is appropriate.     Data Reviewed: I have personally reviewed following labs and imaging studies  CBC: Recent Labs  Lab 11/15/19 1504 11/16/19 0159 11/17/19 0315 11/18/19 0357 11/19/19 0843  WBC 8.3 7.3 7.3 7.8 6.3  NEUTROABS  --  5.2  --   --   --   HGB 6.4* 6.6* 7.3* 7.5* 8.0*  HCT 19.6* 20.6* 21.6* 22.0* 24.0*  MCV 95.1 95.4 90.8 89.4 91.6  PLT 120* 137* 138* 129* 125*    Basic Metabolic Panel: Recent Labs    Lab 11/15/19 1504 11/16/19 0159 11/17/19 0315 11/17/19 2035 11/18/19 0357 11/19/19 0843  NA 134* 137 138  --  136 137  K 5.1 5.1 4.2  --  4.0 4.5  CL 93* 95* 95*  --  94* 94*  CO2 19* 22 27  --  24 25  GLUCOSE 295* 201* 139*  --  169* 243*  BUN 124* 109* 77*  --  89* 91*  CREATININE 13.21* 13.08* 8.22*  --  9.38* 9.57*  CALCIUM 7.9* 8.2* 8.4*  --  8.3* 9.1  PHOS  --   --   --  9.6*  --   --     GFR: Estimated Creatinine Clearance: 11.5 mL/min (A) (by C-G formula based on SCr of  9.57 mg/dL (H)).  Liver Function Tests: Recent Labs  Lab 11/16/19 0159 11/19/19 0843  AST 25 22  ALT 28 30  ALKPHOS 207* 250*  BILITOT 0.7 0.7  PROT 7.6 7.9  ALBUMIN 3.1* 3.3*    CBG: Recent Labs  Lab 11/18/19 0756 11/18/19 1247 11/18/19 1646 11/18/19 2043 11/19/19 0913  GLUCAP 179* 236* 138* 242* 219*     Recent Results (from the past 240 hour(s))  Blood Culture (routine x 2)     Status: None (Preliminary result)   Collection Time: 11/15/19  5:28 PM   Specimen: BLOOD  Result Value Ref Range Status   Specimen Description BLOOD BLOOD RIGHT FOREARM  Final   Special Requests   Final    BOTTLES DRAWN AEROBIC AND ANAEROBIC Blood Culture adequate volume   Culture   Final    NO GROWTH 4 DAYS Performed at [facility], [address], [place], [place] [address]    Report Status PENDING  Incomplete  Blood Culture (routine x 2)     Status: None (Preliminary result)   Collection Time: 11/15/19  5:28 PM   Specimen: BLOOD  Result Value Ref Range Status   Specimen Description BLOOD RIGHT ANTECUBITAL  Final   Special Requests   Final    BOTTLES DRAWN AEROBIC AND ANAEROBIC Blood Culture results may not be optimal due to an inadequate volume of blood received in culture bottles   Culture   Final    NO GROWTH 4 DAYS Performed at [facility], [address], [place], [place] [address]    Report Status PENDING  Incomplete  SARS Coronavirus 2 by RT PCR (hospital order,  performed in [facility] hospital lab) Nasopharyngeal Nasopharyngeal Swab     Status: None   Collection Time: 11/15/19  7:14 PM   Specimen: Nasopharyngeal Swab  Result Value Ref Range Status   SARS Coronavirus 2 NEGATIVE NEGATIVE Final    Comment: (NOTE) SARS-CoV-2 target nucleic acids are NOT DETECTED.  The SARS-CoV-2 RNA is generally detectable in upper and lower respiratory specimens during the acute phase of infection. The lowest concentration of SARS-CoV-2 viral copies this assay can detect is 250 copies / mL. A negative result does not preclude SARS-CoV-2 infection and should not be used as the sole basis for treatment or other patient management decisions.  A negative result may occur with improper specimen collection / handling, submission of specimen other than nasopharyngeal swab, presence of viral mutation(s) within the areas targeted by this assay, and inadequate number of viral copies (<250 copies / mL). A negative result must be combined with clinical observations, patient history, and epidemiological information.  Fact Sheet for Patients:   StrictlyIdeas.no  Fact Sheet for Healthcare Providers: BankingDealers.co.za  This test is not yet approved or  cleared by the [place] FDA and has been authorized for detection and/or diagnosis of SARS-CoV-2 by FDA under an Emergency Use Authorization (EUA).  This EUA will remain in effect (meaning this test can be used) for the duration of the COVID-19 declaration under Section 564(b)(1) of the Act, 21 U.S.C. section 360bbb-3(b)(1), unless the authorization is terminated or revoked sooner.  Performed at [facility], [address] [place],  [address]          Radiology Studies: DG Chest 2 View  Result Date: 11/18/2019 CLINICAL DATA:  Acute respiratory failure with hypoxia. EXAM: CHEST - 2 VIEW COMPARISON:  November 15, 2019. FINDINGS: The heart size and  mediastinal contours are within normal  limits. No pneumothorax or pleural effusion is noted. Right internal jugular dialysis catheter is unchanged in position. Bilateral perihilar opacities are noted, left greater than right, consistent with pneumonia. The visualized skeletal structures are unremarkable. IMPRESSION: Grossly stable bilateral perihilar opacities, left greater than right, consistent with pneumonia. Electronically Signed   By: Marijo Conception M.D.   On: 11/18/2019 16:11        Scheduled Meds: . sodium chloride   Intravenous Once  . amiodarone  200 mg Oral Daily  . amitriptyline  25 mg Oral QHS  . apixaban  2.5 mg Oral BID  . vitamin C  1,000 mg Oral BID  . aspirin EC  81 mg Oral Daily  . atorvastatin  40 mg Oral Daily  . calcitRIOL  0.5 mcg Oral Daily  . Chlorhexidine Gluconate Cloth  6 each Topical Q0600  . doxycycline  100 mg Oral Q12H  . febuxostat  80 mg Oral Daily  . [START ON 11/21/2019] ferrous sulfate  325 mg Oral BID WC  . fluticasone furoate-vilanterol  1 puff Inhalation Daily  . furosemide  40 mg Intravenous Q12H  . gentamicin cream  1 application Topical Daily  . hydrALAZINE  10 mg Oral TID  . insulin aspart  0-6 Units Subcutaneous TID WC  . insulin regular human CONCENTRATED  50 Units Subcutaneous BID WC  . ipratropium-albuterol  3 mL Inhalation Daily  . lactulose  10 g Oral Daily  . levothyroxine  100 mcg Oral Q0600  . loratadine  10 mg Oral Daily  . metoprolol tartrate  25 mg Oral BID  . pantoprazole  40 mg Oral BID  . sodium chloride flush  3 mL Intravenous Q12H   Continuous Infusions: . sodium chloride 250 mL (11/18/19 1814)  . sodium chloride    . sodium chloride    . cefTRIAXone (ROCEPHIN)  IV 2 g (11/18/19 1815)  . dialysis solution 2.5% low-MG/low-CA       LOS: 4 days        Hosie Poisson, MD [facility]   To contact the attending provider between 7A-7P or the covering provider during after hours 7P-7A, please log into the  web site [email or web address] and access using universal [place] password for that web site. If you do not have the password, please call the hospital operator.  11/19/2019, 10:13 AM

## 2019-11-19 NOTE — Progress Notes (Signed)
Pt c/o dizziness, nausea and SOB. NP notified. PRN neb given, RT notified and cpap removed and 6LO2NC applied. Scheduled lasix given x 1 and PRN zofran given x 1. Pt reported feeling much better after zofran and lasix dose.

## 2019-11-19 NOTE — Progress Notes (Signed)
Physical Therapy Treatment Patient Details Name: Patrick Hurst MRN: [id number] DOB: Nov 22, 1955 Today's Date: 11/19/2019    History of Present Illness 64 year old with prior history of diastolic heart failure, end-stage renal disease on PD, does require intermittent HD, paroxysmal atrial fibrillation, anemia of chronic disease gallop, depression, Bell's palsy, carpal tunnel syndrome upper lipidemia, hypertension, DM, COPD, OHS, OSA on BIPAP at night, Hypothyroidism, presents to ED, with SOB.    PT Comments    Patient received sitting edge of bed, family present. Patient reports he continues to feel dizzy, breathing is better, but still not back to baseline. O2 via nasal canula down to 2 lpm. Sats 91% with ambulation on 3 lpm. Ambulated 150 feet pushing IV pole with min guard,  increased staggering and narrow base of support.  Required one standing rest break. Patient will continue to benefit from skilled PT while here to improve strength, activity tolerance and safety with mobility.      Follow Up Recommendations  Outpatient PT;Other (comment) (for higher level balance and for dizziness if not cleared up)     Equipment Recommendations  None recommended by PT    Recommendations for Other Services       Precautions / Restrictions Precautions Precaution Comments: mod fall Restrictions Weight Bearing Restrictions: No    Mobility  Bed Mobility               General bed mobility comments: Not assessed, patient received sitting up on side of bed.  Transfers Overall transfer level: Needs assistance Equipment used: None Transfers: Sit to/from Stand Sit to Stand: Supervision            Ambulation/Gait Ambulation/Gait assistance: Min guard Gait Distance (Feet): 150 Feet Assistive device: IV Pole Gait Pattern/deviations: Narrow base of support;Step-through pattern;Drifts right/left Gait velocity: WFL   General Gait Details: 1 standing rest break with ambulation due to  fatigue, reports feeling dizzy. increased staggering.   Stairs             Wheelchair Mobility    Modified Rankin (Stroke Patients Only)       Balance Overall balance assessment: Needs assistance Sitting-balance support: Feet supported;No upper extremity supported Sitting balance-Leahy Scale: Good Sitting balance - Comments: sits steady EOB   Standing balance support: During functional activity;Single extremity supported Standing balance-Leahy Scale: Good Standing balance comment: slight unsteadiness with ambulation holding to IV pole.                            Cognition Arousal/Alertness: Awake/alert Behavior During Therapy: WFL for tasks assessed/performed Overall Cognitive Status: Within Functional Limits for tasks assessed                                        Exercises      General Comments        Pertinent Vitals/Pain Pain Assessment: No/denies pain    Home Living                      Prior Function            PT Goals (current goals can now be found in the care plan section) Acute Rehab PT Goals Patient Stated Goal: to go home PT Goal Formulation: With patient/family Time For Goal Achievement: 12/01/19 Potential to Achieve Goals: Good Progress towards PT goals: Progressing toward goals  Frequency    Min 2X/week      PT Plan Current plan remains appropriate    Co-evaluation              AM-PAC PT "6 Clicks" Mobility   Outcome Measure  Help needed turning from your back to your side while in a flat bed without using bedrails?: None Help needed moving from lying on your back to sitting on the side of a flat bed without using bedrails?: None Help needed moving to and from a bed to a chair (including a wheelchair)?: A Little Help needed standing up from a chair using your arms (e.g., wheelchair or bedside chair)?: A Little Help needed to walk in hospital room?: A Little Help needed climbing  3-5 steps with a railing? : A Little 6 Click Score: 20    End of Session Equipment Utilized During Treatment: Gait belt Activity Tolerance: Patient limited by fatigue;Other (comment) (dizziness) Patient left: in bed;with call bell/phone within reach;with family/visitor present Nurse Communication: Mobility status PT Visit Diagnosis: Muscle weakness (generalized) (M62.81);Difficulty in walking, not elsewhere classified (R26.2);Dizziness and giddiness (R42)     Time: 1308-6578 PT Time Calculation (min) (ACUTE ONLY): 15 min  Charges:  $Gait Training: 8-22 mins                     Gurney Balthazor, PT, GCS 11/19/19,10:18 AM

## 2019-11-20 ENCOUNTER — Ambulatory Visit: Payer: Commercial Managed Care - PPO | Admitting: Family

## 2019-11-20 ENCOUNTER — Inpatient Hospital Stay: Payer: Commercial Managed Care - PPO

## 2019-11-20 DIAGNOSIS — K7460 Unspecified cirrhosis of liver: Secondary | ICD-10-CM

## 2019-11-20 DIAGNOSIS — D638 Anemia in other chronic diseases classified elsewhere: Secondary | ICD-10-CM

## 2019-11-20 DIAGNOSIS — K7581 Nonalcoholic steatohepatitis (NASH): Secondary | ICD-10-CM

## 2019-11-20 DIAGNOSIS — J189 Pneumonia, unspecified organism: Principal | ICD-10-CM

## 2019-11-20 DIAGNOSIS — K769 Liver disease, unspecified: Secondary | ICD-10-CM

## 2019-11-20 LAB — CULTURE, BLOOD (ROUTINE X 2)
Culture: NO GROWTH
Culture: NO GROWTH
Special Requests: ADEQUATE

## 2019-11-20 LAB — BASIC METABOLIC PANEL
Anion gap: 16 — ABNORMAL HIGH (ref 5–15)
BUN: 92 mg/dL — ABNORMAL HIGH (ref 8–23)
CO2: 26 mmol/L (ref 22–32)
Calcium: 8.6 mg/dL — ABNORMAL LOW (ref 8.9–10.3)
Chloride: 94 mmol/L — ABNORMAL LOW (ref 98–111)
Creatinine, Ser: 10.08 mg/dL — ABNORMAL HIGH (ref 0.61–1.24)
GFR calc Af Amer: 6 mL/min — ABNORMAL LOW (ref >60)
GFR calc non Af Amer: 5 mL/min — ABNORMAL LOW (ref >60)
Glucose, Bld: 274 mg/dL — ABNORMAL HIGH (ref 70–99)
Potassium: 4.7 mmol/L (ref 3.5–5.1)
Sodium: 136 mmol/L (ref 135–145)

## 2019-11-20 LAB — EXPECTORATED SPUTUM ASSESSMENT W GRAM STAIN, RFLX TO RESP C

## 2019-11-20 LAB — GLUCOSE, CAPILLARY
Glucose-Capillary: 250 mg/dL — ABNORMAL HIGH (ref 70–99)
Glucose-Capillary: 207 mg/dL — ABNORMAL HIGH (ref 70–99)
Glucose-Capillary: 171 mg/dL — ABNORMAL HIGH (ref 70–99)
Glucose-Capillary: 246 mg/dL — ABNORMAL HIGH (ref 70–99)

## 2019-11-20 LAB — PARATHYROID HORMONE, INTACT (NO CA): PTH: 169 pg/mL — ABNORMAL HIGH (ref 15–65)

## 2019-11-20 MED ORDER — FUROSEMIDE 40 MG PO TABS
40.0000 mg | ORAL_TABLET | Freq: Every day | ORAL | Status: DC
  Administered 2019-11-20 – 2019-11-23 (×4): 40 mg via ORAL
  Filled 2019-11-20 (×4): qty 1

## 2019-11-20 MED ORDER — ALUM & MAG HYDROXIDE-SIMETH 200-200-20 MG/5ML PO SUSP
15.0000 mL | ORAL | Status: DC | PRN
  Administered 2019-11-20 – 2019-11-22 (×3): 15 mL via ORAL
  Filled 2019-11-20 (×3): qty 30

## 2019-11-20 MED ORDER — CALCIUM ACETATE (PHOS BINDER) 667 MG PO CAPS
1334.0000 mg | ORAL_CAPSULE | Freq: Three times a day (TID) | ORAL | Status: DC
  Administered 2019-11-20 – 2019-11-23 (×9): 1334 mg via ORAL
  Filled 2019-11-20 (×12): qty 2

## 2019-11-20 NOTE — Consult Note (Signed)
Patrick Darby, MD [address]  [place], [place] [address]  Main: [phone]  Fax: [phone] Pager: [phone]   Consultation  Referring Provider:     No ref. provider found Primary Care Physician:  [name], MD Primary Gastroenterologist:  Dr. Lucilla Lame         Reason for Consultation:     Anemia  Date of Admission:  11/15/2019 Date of Consultation:  11/20/2019         HPI:   Patrick Hurst is a 64 y.o. male with history of diastolic heart failure, end-stage renal disease on peritoneal dialysis, A. fib on Eliquis, is admitted on 7/15 secondary to hypoxic respiratory failure from community-acquired pneumonia. Patient is currently being treated for his pneumonia. Pulmonary is on board, underwent CT chest which confirmed pneumonia. He was incidentally found to have hypervascular liver lesion in right hepatic lobe. He underwent right upper quadrant ultrasound today which revealed indeterminate solid liver mass. Patient presented with acute on chronic anemia on admission, hemoglobin was 6.6 with baseline between 8 and 9. Recent hemoglobin was 8.8 on 7/8. Work-up of anemia revealed anemia of chronic disease. Normal B12 and folate panel. Patient received blood transfusion and responded appropriately. He also has chronic thrombocytopenia dating back to 2013 Eliquis has been held since 7/15 due to anemia GI is consulted for work-up of anemia and guaiac positive stool during this admission  Of note, patient is known to have cirrhosis with splenomegaly as evidenced by chronic thrombocytopenia as well as by cross-sectional imaging in 03/2015.  Patient reports that he knows that he has fatty liver but not cirrhosis.  Patient acknowledges that he does not drink alcohol, denies drug abuse, tobacco use.  Patient denies melena, rectal bleeding, nausea, vomiting, abdominal pain or distention.  He does have swelling of legs.  Patient is currently on 3 L of oxygen.  He is  not on oxygen at home other than CPAP.  Patient's wife is bedside  NSAIDs: None  Antiplts/Anticoagulants/Anti thrombotics: Eliquis for history of A. fib  GI Procedures: Colonoscopy in 2011, normal  Past Medical History:  Diagnosis Date  . Acute on chronic diastolic CHF (congestive heart failure) ([place]) 06/21/2018  . Acute respiratory failure with hypoxia ([place]) 06/21/2018  . Bell palsy 02/12/2015  . Carpal tunnel syndrome 02/12/2015  . Dependence on nocturnal oxygen therapy    2 LITERS WITH BIPAP  . Depression   . Gastric ulcer   . GERD (gastroesophageal reflux disease)   . Gout   . History of chicken pox   . Multifocal pneumonia 03/04/2019  . Sleep apnea treated with nocturnal BiPAP     Past Surgical History:  Procedure Laterality Date  . AV FISTULA PLACEMENT Left 11/08/2019   Procedure: ARTERIOVENOUS (AV) FISTULA CREATION (RADIALCEPHALIC);  Surgeon: Algernon Huxley, MD;  Location: [facility] ORS;  Service: Vascular;  Laterality: Left;  . CATARACT EXTRACTION Bilateral 2014 and 2015  . CHOLECYSTECTOMY  04/28/2011   Laproscopic; Dr. Pat Patrick  . DIALYSIS/PERMA CATHETER INSERTION N/A 05/24/2019   Procedure: DIALYSIS/PERMA CATHETER INSERTION;  Surgeon: Algernon Huxley, MD;  Location: [facility];  Service: Cardiovascular;  Laterality: N/A;  . DIALYSIS/PERMA CATHETER INSERTION N/A 10/09/2019   Procedure: DIALYSIS/PERMA CATHETER INSERTION;  Surgeon: Katha Cabal, MD;  Location: [facility];  Service: Cardiovascular;  Laterality: N/A;  . EYE SURGERY    . GALLBLADDER SURGERY    . LASIK Bilateral 2019   medical  .  SHOULDER SURGERY Right 2012   Dr. Leanor Kail    Prior to Admission medications   Medication Sig Start Date End Date Taking? Authorizing Provider  albuterol (PROAIR HFA) 108 (90 Base) MCG/ACT inhaler Inhale 2 puffs into the lungs every 6 (six) hours as needed for wheezing or shortness of breath. 11/02/19  Yes [name], MD  amiodarone (PACERONE) 200 MG tablet  Take 200 mg by mouth daily. 08/16/19  Yes [provider]  amitriptyline (ELAVIL) 25 MG tablet TAKE 1 TABLET AT BEDTIME Patient taking differently: Take 25 mg by mouth at bedtime.  07/26/19  Yes [name], MD  amLODipine (NORVASC) 5 MG tablet Take 1 tablet (5 mg total) by mouth daily. 11/12/19  Yes [name], MD  Ascorbic Acid (VITAMIN C) 1000 MG tablet Take 1,000 mg by mouth 2 (two) times daily.    Yes [provider]  atorvastatin (LIPITOR) 40 MG tablet TAKE 1 TABLET DAILY 10/15/19  Yes [name], MD  calcitRIOL (ROCALTROL) 0.5 MCG capsule Take 0.5 mcg by mouth daily. 12/29/18  Yes [provider]  cetirizine (ZYRTEC) 10 MG tablet Take 10 mg by mouth daily.    Yes [provider]  doxycycline (VIBRA-TABS) 100 MG tablet Take 1 tablet (100 mg total) by mouth 2 (two) times daily. 11/12/19  Yes [name], MD  ELIQUIS 2.5 MG TABS tablet Take 2.5 mg by mouth 2 (two) times daily.  03/23/19  Yes [provider]  fluticasone furoate-vilanterol (BREO ELLIPTA) 100-25 MCG/INH AEPB Inhale 1 puff into the lungs daily. 12/14/18  Yes Wilhelmina Mcardle, MD  gentamicin cream (GARAMYCIN) 0.1 % Apply 1 application topically daily. 09/22/19  Yes [provider]  hydrALAZINE (APRESOLINE) 25 MG tablet TAKE 1 TABLET THREE TIMES A DAY Patient taking differently: Take 25 mg by mouth 3 (three) times daily.  01/01/19  Yes [name], MD  insulin regular human CONCENTRATED (HUMULIN R) 500 UNIT/ML injection Inject up to 25 units twice a day, or as directed by physician Patient taking differently: Inject 50 Units into the skin 2 (two) times daily with a meal. 60 UNITS IN THE AM & 50 UNITS AT NIGHT 01/05/19  Yes Fisher, Kirstie Peri, MD  ipratropium-albuterol (DUONEB) 0.5-2.5 (3) MG/3ML SOLN INHALE 3 ML BY NEBULIZATION EVERY 4 HOURS AS NEEDED Patient taking differently: Inhale 3 mLs into the lungs daily.  11/30/18  Yes Wilhelmina Mcardle, MD  lactulose  (CHRONULAC) 10 GM/15ML solution Take 30 mLs by mouth daily. 09/10/19  Yes [provider]  levothyroxine (SYNTHROID) 100 MCG tablet TAKE 1 TABLET DAILY Patient taking differently: Take 100 mcg by mouth daily before breakfast.  08/01/19  Yes Fisher, Kirstie Peri, MD  metoprolol tartrate (LOPRESSOR) 50 MG tablet Take 0.5 tablets (25 mg total) by mouth 2 (two) times daily. 03/23/19  Yes [name], MD  pantoprazole (PROTONIX) 40 MG tablet TAKE 1 TABLET TWICE A DAY Patient taking differently: Take 40 mg by mouth daily.  02/10/19  Yes [name], MD  testosterone cypionate (DEPO-TESTOSTERONE) 200 MG/ML injection Inject 1 mL (200 mg total) into the muscle every 14 (fourteen) days. 03/23/19  Yes [name], MD  torsemide (DEMADEX) 20 MG tablet Take 40 mg by mouth daily. 08/13/19  Yes [provider]  ULORIC 80 MG TABS Take 80 mg by mouth daily.  05/18/17  Yes [provider]   Current Facility-Administered Medications:  .  0.9 %  sodium chloride infusion (Manually  program via Guardrails IV Fluids), , Intravenous, Once, Patel, Ekta V, MD .  0.9 %  sodium chloride infusion, 250 mL, Intravenous, PRN, Para Skeans, MD, Last Rate: 10 mL/hr at 11/18/19 1814, 250 mL at 11/18/19 1814 .  0.9 %  sodium chloride infusion, 100 mL, Intravenous, PRN, Lateef, Munsoor, MD .  0.9 %  sodium chloride infusion, 100 mL, Intravenous, PRN, Lateef, Munsoor, MD .  acetaminophen (TYLENOL) tablet 650 mg, 650 mg, Oral, Q6H PRN **OR** acetaminophen (TYLENOL) suppository 650 mg, 650 mg, Rectal, Q6H PRN, Posey Pronto, Ekta V, MD .  albuterol (PROVENTIL) (2.5 MG/3ML) 0.083% nebulizer solution 2.5 mg, 2.5 mg, Nebulization, Q2H PRN, Florina Ou V, MD, 2.5 mg at 11/19/19 0221 .  alteplase (CATHFLO ACTIVASE) injection 2 mg, 2 mg, Intracatheter, Once PRN, Lateef, Munsoor, MD .  alum & mag hydroxide-simeth (MAALOX/MYLANTA) 751-025-85 MG/5ML suspension 15 mL, 15 mL, Oral, Q4H PRN, Hosie Poisson, MD, 15 mL at  11/20/19 1018 .  amiodarone (PACERONE) tablet 200 mg, 200 mg, Oral, Daily, Florina Ou V, MD, 200 mg at 11/20/19 1017 .  amitriptyline (ELAVIL) tablet 25 mg, 25 mg, Oral, QHS, Para Skeans, MD, 25 mg at 11/19/19 2240 .  ascorbic acid (VITAMIN C) tablet 1,000 mg, 1,000 mg, Oral, BID, Florina Ou V, MD, 1,000 mg at 11/20/19 1017 .  aspirin EC tablet 81 mg, 81 mg, Oral, Daily, Florina Ou V, MD, 81 mg at 11/20/19 1017 .  atorvastatin (LIPITOR) tablet 40 mg, 40 mg, Oral, Daily, Florina Ou V, MD, 40 mg at 11/20/19 1016 .  calcitRIOL (ROCALTROL) capsule 0.5 mcg, 0.5 mcg, Oral, Daily, Florina Ou V, MD, 0.5 mcg at 11/20/19 1017 .  calcium acetate (PHOSLO) capsule 1,334 mg, 1,334 mg, Oral, TID WC, Kolluru, Sarath, MD, 1,334 mg at 11/20/19 1718 .  Chlorhexidine Gluconate Cloth 2 % PADS 6 each, 6 each, Topical, Q0600, Holley Raring, Munsoor, MD, 6 each at 11/20/19 0618 .  dialysis solution 2.5% low-MG/low-CA dianeal solution, , Intraperitoneal, Q24H, Kolluru, Sarath, MD, Last Rate: 0 mL/hr at 11/20/19 0449, 12 L at 11/20/19 2008 .  doxycycline (VIBRA-TABS) tablet 100 mg, 100 mg, Oral, Q12H, Karleen Hampshire, Vijaya, MD, 100 mg at 11/20/19 1016 .  febuxostat (ULORIC) tablet 80 mg, 80 mg, Oral, Daily, Florina Ou V, MD, 80 mg at 11/20/19 1016 .  fluticasone furoate-vilanterol (BREO ELLIPTA) 100-25 MCG/INH 1 puff, 1 puff, Inhalation, Daily, Para Skeans, MD, 1 puff at 11/20/19 1301 .  furosemide (LASIX) tablet 40 mg, 40 mg, Oral, Daily, Kolluru, Sarath, MD, 40 mg at 11/20/19 1301 .  gentamicin cream (GARAMYCIN) 0.1 % 1 application, 1 application, Topical, Daily, Kolluru, Sarath, MD, 1 application at 27/78/24 2009 .  heparin injection 1,000 Units, 1,000 Units, Dialysis, PRN, Lateef, Munsoor, MD .  hydrALAZINE (APRESOLINE) tablet 10 mg, 10 mg, Oral, TID, Para Skeans, MD, 10 mg at 11/20/19 1718 .  HYDROcodone-acetaminophen (NORCO/VICODIN) 5-325 MG per tablet 1-2 tablet, 1-2 tablet, Oral, Q4H PRN, Florina Ou V, MD .  insulin  aspart (novoLOG) injection 0-6 Units, 0-6 Units, Subcutaneous, TID WC, Benita Gutter, RPH, 1 Units at 11/20/19 1719 .  insulin regular human CONCENTRATED (HUMULIN R) 500 UNIT/ML kwikpen 15 Units, 15 Units, Subcutaneous, BID WC, Hosie Poisson, MD, 15 Units at 11/20/19 1720 .  ipratropium-albuterol (DUONEB) 0.5-2.5 (3) MG/3ML nebulizer solution 3 mL, 3 mL, Inhalation, Daily, Florina Ou V, MD, 3 mL at 11/20/19 0845 .  lactulose (CHRONULAC) 10 GM/15ML solution 10 g, 10 g, Oral, Daily, Posey Pronto, Gretta Cool, MD, 10  g at 11/20/19 1017 .  levothyroxine (SYNTHROID) tablet 100 mcg, 100 mcg, Oral, Q0600, Para Skeans, MD, 100 mcg at 11/20/19 0457 .  lidocaine (PF) (XYLOCAINE) 1 % injection 5 mL, 5 mL, Intradermal, PRN, Lateef, Munsoor, MD .  lidocaine-prilocaine (EMLA) cream 1 application, 1 application, Topical, PRN, Lateef, Munsoor, MD .  loratadine (CLARITIN) tablet 10 mg, 10 mg, Oral, Daily, Florina Ou V, MD, 10 mg at 11/20/19 1016 .  metoprolol tartrate (LOPRESSOR) tablet 25 mg, 25 mg, Oral, BID, Florina Ou V, MD, 25 mg at 11/20/19 1016 .  morphine 2 MG/ML injection 2 mg, 2 mg, Intravenous, Q2H PRN, Para Skeans, MD .  ondansetron [facility]) injection 4 mg, 4 mg, Intravenous, Q6H PRN, Sharion Settler, NP, 4 mg at 11/19/19 0321 .  pantoprazole (PROTONIX) EC tablet 40 mg, 40 mg, Oral, BID, Hosie Poisson, MD, 40 mg at 11/20/19 1016 .  pentafluoroprop-tetrafluoroeth (GEBAUERS) aerosol 1 application, 1 application, Topical, PRN, Lateef, Munsoor, MD .  sodium chloride flush (NS) 0.9 % injection 3 mL, 3 mL, Intravenous, Q12H, Florina Ou V, MD, 3 mL at 11/20/19 1019 .  sodium chloride flush (NS) 0.9 % injection 3 mL, 3 mL, Intravenous, PRN, Para Skeans, MD .  traZODone (DESYREL) tablet 50 mg, 50 mg, Oral, QHS PRN, Sharion Settler, NP, 50 mg at 11/17/19 2210   Family History  Problem Relation Age of Onset  . COPD Mother   . Cancer Mother   . Heart disease Father      Social History   Tobacco Use  .  Smoking status: Former Smoker    Packs/day: 1.00    Years: 15.00    Pack years: 15.00    Types: Cigarettes    Quit date: 05/03/1978    Years since quitting: 41.5  . Smokeless tobacco: Never Used  . Tobacco comment: started smoking at age 86  Vaping Use  . Vaping Use: Never used  Substance Use Topics  . Alcohol use: No    Alcohol/week: 0.0 standard drinks  . Drug use: No    Allergies as of 11/15/2019 - Review Complete 11/15/2019  Allergen Reaction Noted  . Mucinex [guaifenesin er] Swelling 02/12/2015  . Levaquin [levofloxacin] Palpitations 05/14/2015    Review of Systems:    All systems reviewed and negative except where noted in HPI.   Physical Exam:  Vital signs in last 24 hours: Temp:  [97.5 F (36.4 C)-98.3 F (36.8 C)] 98.2 F (36.8 C) (07/20 1950) Pulse Rate:  [59-75] 66 (07/20 1950) Resp:  [12-20] 20 (07/20 1950) BP: (103-152)/(46-73) 149/69 (07/20 1950) SpO2:  [86 %-99 %] 98 % (07/20 1950) Last BM Date: 11/18/19 General:   Pleasant, cooperative in NAD Head:  Normocephalic and atraumatic. Eyes:   No icterus.   Conjunctiva pale. PERRLA. Ears:  Normal auditory acuity. Neck:  Supple; no masses or thyroidomegaly Lungs: Respirations even and unlabored. Lungs crackles to auscultation bilaterally.   No wheezes, or rhonchi.  Heart:  Regular rate and rhythm;  Without murmur, clicks, rubs or gallops Abdomen:  Soft, obese, nondistended, nontender. Normal bowel sounds. No appreciable masses or hepatomegaly.  No rebound or guarding.  Rectal:  Not performed. Msk:  Symmetrical without gross deformities.  Strength normal Extremities: 2+ edema, no cyanosis or clubbing. Neurologic:  Alert and oriented x3;  grossly normal neurologically. Skin:  Intact without significant lesions or rashes. Psych:  Alert and cooperative. Normal affect.  LAB RESULTS: CBC Latest Ref Rng & Units 11/19/2019 11/18/2019 11/17/2019  WBC 4.0 -  10.5 K/uL 6.3 7.8 7.3  Hemoglobin 13.0 - 17.0 g/dL 8.0(L)  7.5(L) 7.3(L)  Hematocrit 39 - 52 % 24.0(L) 22.0(L) 21.6(L)  Platelets 150 - 400 K/uL 125(L) 129(L) 138(L)    BMET BMP Latest Ref Rng & Units 11/20/2019 11/19/2019 11/18/2019  Glucose 70 - 99 mg/dL 274(H) 243(H) 169(H)  BUN 8 - 23 mg/dL 92(H) 91(H) 89(H)  Creatinine 0.61 - 1.24 mg/dL 10.08(H) 9.57(H) 9.38(H)  BUN/Creat Ratio 10 - 24 - - -  Sodium 135 - 145 mmol/L 136 137 136  Potassium 3.5 - 5.1 mmol/L 4.7 4.5 4.0  Chloride 98 - 111 mmol/L 94(L) 94(L) 94(L)  CO2 22 - 32 mmol/L '26 25 24  ' Calcium 8.9 - 10.3 mg/dL 8.6(L) 9.1 8.3(L)    LFT Hepatic Function Latest Ref Rng & Units 11/19/2019 11/16/2019 03/05/2019  Total Protein 6.5 - 8.1 g/dL 7.9 7.6 7.1  Albumin 3.5 - 5.0 g/dL 3.3(L) 3.1(L) 3.3(L)  AST 15 - 41 U/L 22 25 13(L)  ALT 0 - 44 U/L '30 28 16  ' Alk Phosphatase 38 - 126 U/L 250(H) 207(H) 57  Total Bilirubin 0.3 - 1.2 mg/dL 0.7 0.7 1.0  Bilirubin, Direct 0.00 - 0.40 mg/dL - - -     STUDIES: CT CHEST WO CONTRAST  Result Date: 11/19/2019 CLINICAL DATA:  Dyspnea, chronic, diastolic heart failure, end-stage renal disease EXAM: CT CHEST WITHOUT CONTRAST TECHNIQUE: Multidetector CT imaging of the chest was performed following the standard protocol without IV contrast. COMPARISON:  06/19/2019 FINDINGS: Cardiovascular: Extensive coronary artery calcification is noted, predominantly within the left main and left anterior descending coronary artery. Cardiac size is at the upper limits of normal. No pericardial effusion. Central pulmonary arteries are enlarged in keeping with changes of pulmonary arterial hypertension. The thoracic aorta is of normal caliber. Right internal jugular central venous catheter tip is noted within the right atrium. Mediastinum/Nodes: No pathologic thoracic adenopathy. Lungs/Pleura: Since the prior examination, there has developed extensive bilateral asymmetric airspace infiltrates, more severe within the superior segment of the left lower lobe, likely infectious or  inflammatory in the acute setting. No superimposed significant pulmonary edema. No pneumothorax or pleural effusion. The central airways are widely patent. Upper Abdomen: Cholecystectomy has been performed. Since the prior examination, there has developed a multilobulated low-attenuation lesion within segment 4 B of the liver which is indeterminate. This may represent a developing hepatic mass or complex fluid collection such as a hepatic abscess. This measures roughly 4.1 x 6.9 cm on axial image # 134/2. A trace amount of perihepatic ascites has developed. Musculoskeletal: No lytic or blastic bone lesion is identified. IMPRESSION: Interval development of multifocal pulmonary infiltrates, most in keeping with atypical infection in the acute setting. Interval development of a multilobulated indeterminate lesion within the right hepatic lobe. This would be better assessed, at least initially, with dedicated hepatics sonography or contrast enhanced CT examination. Coronary artery calcification Aortic Atherosclerosis (ICD10-I70.0). Electronically Signed   By: Fidela Salisbury MD   On: 11/19/2019 19:36   US Abdomen Limited RUQ  Result Date: 11/20/2019 CLINICAL DATA:  Liver lesion. EXAM: ULTRASOUND ABDOMEN LIMITED RIGHT UPPER QUADRANT COMPARISON:  Chest CT 11/19/2019. Right upper quadrant abdominal ultrasound 05/08/2016. FINDINGS: Gallbladder: Surgically absent. Common bile duct: Diameter: 4 mm Liver: Background increased parenchymal echogenicity diffusely. Approximately 7.8 x 5.3 x 7.6 cm lesion anteriorly in the liver with heterogeneous posterior shadowing and a small amount of internal vascularity. The lesion demonstrates heterogeneous echogenicity but is primarily hyperechoic. Portal vein is patent on color Doppler  imaging with normal direction of blood flow towards the liver. Other: None. IMPRESSION: Indeterminate solid liver mass. When the patient is clinically stable and able to follow directions and hold their  breath (preferably as an outpatient), further evaluation with a contrast-enhanced abdominal MRI is recommended. Electronically Signed   By: Logan Bores M.D.   On: 11/20/2019 11:53      Impression / Plan:   Patrick Hurst is a 64 y.o. male with morbid obesity, diastolic heart failure, metabolic syndrome, A. fib on Eliquis, NASH cirrhosis ESRD on peritoneal dialysis admitted with hypoxic respiratory failure secondary to community-acquired pneumonia. He also presented with acute on chronic anemia.  Acute on chronic anemia: Patient responded appropriately to blood transfusion No evidence of active GI bleed, no witnessed episodes of melena, hematemesis, coffee-ground emesis or rectal bleeding Iron studies are consistent with anemia of chronic disease, recommend to discontinue oral iron Guaiac stool positive result in inpatient setting does not carry any clinical significance, do not recommend this test as inpatient Patient is currently recovering from pneumonia and high risk for anesthesia to undergo endoscopic procedures especially he is not actively bleeding at this time Anemia is likely multifactorial, combination of infection, end-stage renal disease, anemia of chronic disease Okay to continue Protonix 40 mg p.o. twice daily Recommend close GI follow-up with outpatient evaluation after he recovers from pneumonia  Right hepatic lobe indeterminate solid lesion Concern for [facility] given history of cirrhosis Recommend triple phase CT liver mass protocol as inpatient Patient is not a candidate for MRI secondary to ESRD Check serum AFP levels Follow-up with GI/hepatology as outpatient  Compensated cirrhosis likely secondary to [facility] in setting of metabolic syndrome Hepatitis B and C serologies negative, patient is immune to hepatitis A, HIV nonreactive Recommend EGD for variceal screening as outpatient Tight control of diabetes Low-sodium diet Indeterminate liver lesion: Work-up as above  Thank  you for involving me in the care of this patient.      LOS: 5 days   Sherri Sear, MD  11/20/2019, 8:17 PM   Note: This dictation was prepared with Dragon dictation along with smaller phrase technology. Any transcriptional errors that result from this process are unintentional.

## 2019-11-20 NOTE — Progress Notes (Signed)
Pulmonary Medicine          Date: 11/20/2019,   MRN# [id number] Patrick Hurst 1955/06/02     AdmissionWeight: (!) 140 kg                 CurrentWeight: (!) 140 kg   Referring physician: Dr Juleen China   CHIEF COMPLAINT:   Acute on Chronic hypoxemic respiratory failure   SUBJECTIVE   Patient is feeling better post PD.  He is on 3l/min supplemental O2 at this time.  He had liver mass found on [place] abd which will be worked up when in chronic stable state.   He is working with RT using [place]   Past Medical History:  Diagnosis Date   Acute on chronic diastolic CHF (congestive heart failure) ([place]) 06/21/2018   Acute respiratory failure with hypoxia ([place]) 06/21/2018   Bell palsy 02/12/2015   Carpal tunnel syndrome 02/12/2015   Dependence on nocturnal oxygen therapy    2 LITERS WITH BIPAP   Depression    Gastric ulcer    GERD (gastroesophageal reflux disease)    Gout    History of chicken pox    Multifocal pneumonia 03/04/2019   Sleep apnea treated with nocturnal BiPAP      SURGICAL HISTORY   Past Surgical History:  Procedure Laterality Date   AV FISTULA PLACEMENT Left 11/08/2019   Procedure: ARTERIOVENOUS (AV) FISTULA CREATION ([place]);  Surgeon: Algernon Huxley, MD;  Location: [facility];  Service: Vascular;  Laterality: Left;   CATARACT EXTRACTION Bilateral 2014 and 2015   CHOLECYSTECTOMY  04/28/2011   Laproscopic; Dr. Pat Patrick   DIALYSIS/PERMA CATHETER INSERTION N/A 05/24/2019   Procedure: DIALYSIS/PERMA CATHETER INSERTION;  Surgeon: Algernon Huxley, MD;  Location: [place] [facility];  Service: Cardiovascular;  Laterality: N/A;   DIALYSIS/PERMA CATHETER INSERTION N/A 10/09/2019   Procedure: DIALYSIS/PERMA CATHETER INSERTION;  Surgeon: Katha Cabal, MD;  Location: [place] [facility];  Service: Cardiovascular;  Laterality: N/A;   EYE SURGERY     GALLBLADDER SURGERY     LASIK Bilateral 2019   medical   SHOULDER  SURGERY Right 2012   Dr. Leanor Kail     FAMILY HISTORY   Family History  Problem Relation Age of Onset   COPD Mother    Cancer Mother    Heart disease Father      SOCIAL HISTORY   Social History   Tobacco Use   Smoking status: Former Smoker    Packs/day: 1.00    Years: 15.00    Pack years: 15.00    Types: Cigarettes    Quit date: 05/03/1978    Years since quitting: 41.5   Smokeless tobacco: Never Used   Tobacco comment: started smoking at age 36  Vaping Use   Vaping Use: Never used  Substance Use Topics   Alcohol use: No    Alcohol/week: 0.0 standard drinks   Drug use: No     MEDICATIONS    Home Medication:    Current Medication:  Current Facility-Administered Medications:    0.9 %  sodium chloride infusion (Manually program via Guardrails IV Fluids), , Intravenous, Once, Patel, Ekta V, MD   0.9 %  sodium chloride infusion, 250 mL, Intravenous, PRN, Para Skeans, MD, Last Rate: 10 mL/hr at 11/18/19 1814, 250 mL at 11/18/19 1814   0.9 %  sodium chloride infusion, 100 mL, Intravenous, PRN, Lateef, Munsoor, MD   0.9 %  sodium chloride infusion, 100  mL, Intravenous, PRN, Lateef, Munsoor, MD   acetaminophen (TYLENOL) tablet 650 mg, 650 mg, Oral, Q6H PRN **OR** acetaminophen (TYLENOL) suppository 650 mg, 650 mg, Rectal, Q6H PRN, Florina Ou V, MD   albuterol (PROVENTIL) (2.5 MG/3ML) 0.083% nebulizer solution 2.5 mg, 2.5 mg, Nebulization, Q2H PRN, Florina Ou V, MD, 2.5 mg at 11/19/19 0221   alteplase (CATHFLO ACTIVASE) injection 2 mg, 2 mg, Intracatheter, Once PRN, Lateef, Munsoor, MD   alum & mag hydroxide-simeth (MAALOX/MYLANTA) 200-200-20 MG/5ML suspension 15 mL, 15 mL, Oral, Q4H PRN, Hosie Poisson, MD, 15 mL at 11/20/19 1018   amiodarone (PACERONE) tablet 200 mg, 200 mg, Oral, Daily, Florina Ou V, MD, 200 mg at 11/20/19 1017   amitriptyline (ELAVIL) tablet 25 mg, 25 mg, Oral, QHS, Florina Ou V, MD, 25 mg at 11/19/19 2240   ascorbic acid  (VITAMIN C) tablet 1,000 mg, 1,000 mg, Oral, BID, Florina Ou V, MD, 1,000 mg at 11/20/19 1017   aspirin EC tablet 81 mg, 81 mg, Oral, Daily, Florina Ou V, MD, 81 mg at 11/20/19 1017   atorvastatin (LIPITOR) tablet 40 mg, 40 mg, Oral, Daily, Florina Ou V, MD, 40 mg at 11/20/19 1016   calcitRIOL (ROCALTROL) capsule 0.5 mcg, 0.5 mcg, Oral, Daily, Florina Ou V, MD, 0.5 mcg at 11/20/19 1017   calcium acetate (PHOSLO) capsule 1,334 mg, 1,334 mg, Oral, TID WC, Kolluru, Sarath, MD, 1,334 mg at 11/20/19 1718   Chlorhexidine Gluconate Cloth 2 % PADS 6 each, 6 each, Topical, Q0600, Holley Raring, Munsoor, MD, 6 each at 11/20/19 1025   dialysis solution 2.5% low-MG/low-CA dianeal solution, , Intraperitoneal, Q24H, Kolluru, Sarath, MD, Stopping Infusion hung by another clincian at 11/20/19 0449   doxycycline (VIBRA-TABS) tablet 100 mg, 100 mg, Oral, Q12H, Karleen Hampshire, Vijaya, MD, 100 mg at 11/20/19 1016   febuxostat (ULORIC) tablet 80 mg, 80 mg, Oral, Daily, Florina Ou V, MD, 80 mg at 11/20/19 1016   fluticasone furoate-vilanterol (BREO ELLIPTA) 100-25 MCG/INH 1 puff, 1 puff, Inhalation, Daily, Florina Ou V, MD, 1 puff at 11/20/19 1301   furosemide (LASIX) tablet 40 mg, 40 mg, Oral, Daily, Kolluru, Sarath, MD, 40 mg at 11/20/19 1301   gentamicin cream (GARAMYCIN) 0.1 % 1 application, 1 application, Topical, Daily, Kolluru, Sarath, MD, 1 application at 85/27/78 1019   heparin injection 1,000 Units, 1,000 Units, Dialysis, PRN, Lateef, Munsoor, MD   hydrALAZINE (APRESOLINE) tablet 10 mg, 10 mg, Oral, TID, Florina Ou V, MD, 10 mg at 11/20/19 1718   HYDROcodone-acetaminophen (NORCO/VICODIN) 5-325 MG per tablet 1-2 tablet, 1-2 tablet, Oral, Q4H PRN, Para Skeans, MD   insulin aspart (novoLOG) injection 0-6 Units, 0-6 Units, Subcutaneous, TID WC, Benita Gutter, RPH, 1 Units at 11/20/19 1719   insulin regular human CONCENTRATED (HUMULIN R) 500 UNIT/ML kwikpen 15 Units, 15 Units, Subcutaneous, BID WC, Hosie Poisson, MD, 15 Units at 11/20/19 1720   ipratropium-albuterol (DUONEB) 0.5-2.5 (3) MG/3ML nebulizer solution 3 mL, 3 mL, Inhalation, Daily, Florina Ou V, MD, 3 mL at 11/20/19 0845   lactulose (CHRONULAC) 10 GM/15ML solution 10 g, 10 g, Oral, Daily, Florina Ou V, MD, 10 g at 11/20/19 1017   levothyroxine (SYNTHROID) tablet 100 mcg, 100 mcg, Oral, Q0600, Florina Ou V, MD, 100 mcg at 11/20/19 0457   lidocaine (PF) (XYLOCAINE) 1 % injection 5 mL, 5 mL, Intradermal, PRN, Lateef, Munsoor, MD   lidocaine-prilocaine (EMLA) cream 1 application, 1 application, Topical, PRN, Lateef, Munsoor, MD   loratadine (CLARITIN) tablet 10 mg, 10 mg, Oral, Daily, Posey Pronto,  Gretta Cool, MD, 10 mg at 11/20/19 1016   metoprolol tartrate (LOPRESSOR) tablet 25 mg, 25 mg, Oral, BID, Florina Ou V, MD, 25 mg at 11/20/19 1016   morphine 2 MG/ML injection 2 mg, 2 mg, Intravenous, Q2H PRN, Para Skeans, MD   ondansetron [facility]) injection 4 mg, 4 mg, Intravenous, Q6H PRN, Sharion Settler, NP, 4 mg at 11/19/19 0321   pantoprazole (PROTONIX) EC tablet 40 mg, 40 mg, Oral, BID, Hosie Poisson, MD, 40 mg at 11/20/19 1016   pentafluoroprop-tetrafluoroeth (GEBAUERS) aerosol 1 application, 1 application, Topical, PRN, Lateef, Munsoor, MD   sodium chloride flush (NS) 0.9 % injection 3 mL, 3 mL, Intravenous, Q12H, Florina Ou V, MD, 3 mL at 11/20/19 1019   sodium chloride flush (NS) 0.9 % injection 3 mL, 3 mL, Intravenous, PRN, Para Skeans, MD   traZODone (DESYREL) tablet 50 mg, 50 mg, Oral, QHS PRN, Sharion Settler, NP, 50 mg at 11/17/19 2210    ALLERGIES   Mucinex [guaifenesin er] and Levaquin [levofloxacin]     REVIEW OF SYSTEMS    Review of Systems:  Gen:  Denies  fever, sweats, chills weigh loss  HEENT: Denies blurred vision, double vision, ear pain, eye pain, hearing loss, nose bleeds, sore throat Cardiac:  No dizziness, chest pain or heaviness, chest tightness,edema Resp:   Denies cough or sputum  porduction, shortness of breath,wheezing, hemoptysis,  Gi: Denies swallowing difficulty, stomach pain, nausea or vomiting, diarrhea, constipation, bowel incontinence Gu:  Denies bladder incontinence, burning urine Ext:   Denies Joint pain, stiffness or swelling Skin: Denies  skin rash, easy bruising or bleeding or hives Endoc:  Denies polyuria, polydipsia , polyphagia or weight change Psych:   Denies depression, insomnia or hallucinations   Other:  All other systems negative   VS: BP 136/60 (BP Location: Right Arm)    Pulse 67    Temp 97.9 F (36.6 C) (Oral)    Resp 16    Ht 5\' 11"  (1.803 m)    Wt (!) 140 kg    SpO2 99%    BMI 43.05 kg/m      PHYSICAL EXAM    GENERAL:NAD, no fevers, chills, no weakness no fatigue HEAD: Normocephalic, atraumatic.  EYES: Pupils equal, round, reactive to light. Extraocular muscles intact. No scleral icterus.  MOUTH: Moist mucosal membrane. Dentition intact. No abscess noted.  EAR, NOSE, THROAT: Clear without exudates. No external lesions.  NECK: Supple. No thyromegaly. No nodules. No JVD.  PULMONARY: Diffuse coarse rhonchi right sided +wheezes CARDIOVASCULAR: S1 and S2. Regular rate and rhythm. No murmurs, rubs, or gallops. No edema. Pedal pulses 2+ bilaterally.  GASTROINTESTINAL: Soft, nontender, nondistended. No masses. Positive bowel sounds. No hepatosplenomegaly.  MUSCULOSKELETAL: No swelling, clubbing, or edema. Range of motion full in all extremities.  NEUROLOGIC: Cranial nerves II through XII are intact. No gross focal neurological deficits. Sensation intact. Reflexes intact.  SKIN: No ulceration, lesions, rashes, or cyanosis. Skin warm and dry. Turgor intact.  PSYCHIATRIC: Mood, affect within normal limits. The patient is awake, alert and oriented x 3. Insight, judgment intact.       IMAGING    DG Chest 2 View  Result Date: 11/18/2019 CLINICAL DATA:  Acute respiratory failure with hypoxia. EXAM: CHEST - 2 VIEW COMPARISON:  November 15, 2019. FINDINGS: The heart size and mediastinal contours are within normal limits. No pneumothorax or pleural effusion is noted. Right internal jugular dialysis catheter is unchanged in position. Bilateral perihilar opacities are noted, left greater than right,  consistent with pneumonia. The visualized skeletal structures are unremarkable. IMPRESSION: Grossly stable bilateral perihilar opacities, left greater than right, consistent with pneumonia. Electronically Signed   By: Marijo Conception M.D.   On: 11/18/2019 16:11   DG Chest 2 View  Result Date: 11/15/2019 CLINICAL DATA:  Short of breath, pneumonia EXAM: CHEST - 2 VIEW COMPARISON:  11/12/2019 FINDINGS: Frontal and lateral views of the chest demonstrate persistent bilateral perihilar airspace disease, slightly improved on the right since prior study but with significant progression on the left. There is no effusion or pneumothorax. Cardiac silhouette is enlarged but stable. Stable right internal jugular dialysis catheter. No acute bony abnormalities. IMPRESSION: 1. Bilateral perihilar airspace disease, left greater than right. There has been progression in the left since prior study. Differential includes bilateral pneumonia versus asymmetric edema. Electronically Signed   By: Randa Ngo M.D.   On: 11/15/2019 15:37   DG Chest 2 View  Result Date: 11/12/2019 CLINICAL DATA:  Shortness of breath for 4-5 days. EXAM: CHEST - 2 VIEW COMPARISON:  Single-view of the chest 03/07/2019. FINDINGS: Patchy airspace disease in the mid and lower lung zones bilaterally is worse on the left. Heart size is normal. No pneumothorax or pleural effusion. Right IJ approach dialysis catheter noted. IMPRESSION: Patchy bilateral airspace disease consistent with pneumonia. Electronically Signed   By: Inge Rise M.D.   On: 11/12/2019 15:34   CT CHEST WO CONTRAST  Result Date: 11/19/2019 CLINICAL DATA:  Dyspnea, chronic, diastolic heart failure, end-stage renal disease  EXAM: CT CHEST WITHOUT CONTRAST TECHNIQUE: Multidetector CT imaging of the chest was performed following the standard protocol without IV contrast. COMPARISON:  06/19/2019 FINDINGS: Cardiovascular: Extensive coronary artery calcification is noted, predominantly within the left main and left anterior descending coronary artery. Cardiac size is at the upper limits of normal. No pericardial effusion. Central pulmonary arteries are enlarged in keeping with changes of pulmonary arterial hypertension. The thoracic aorta is of normal caliber. Right internal jugular central venous catheter tip is noted within the right atrium. Mediastinum/Nodes: No pathologic thoracic adenopathy. Lungs/Pleura: Since the prior examination, there has developed extensive bilateral asymmetric airspace infiltrates, more severe within the superior segment of the left lower lobe, likely infectious or inflammatory in the acute setting. No superimposed significant pulmonary edema. No pneumothorax or pleural effusion. The central airways are widely patent. Upper Abdomen: Cholecystectomy has been performed. Since the prior examination, there has developed a multilobulated low-attenuation lesion within segment 4 B of the liver which is indeterminate. This may represent a developing hepatic mass or complex fluid collection such as a hepatic abscess. This measures roughly 4.1 x 6.9 cm on axial image # 134/2. A trace amount of perihepatic ascites has developed. Musculoskeletal: No lytic or blastic bone lesion is identified. IMPRESSION: Interval development of multifocal pulmonary infiltrates, most in keeping with atypical infection in the acute setting. Interval development of a multilobulated indeterminate lesion within the right hepatic lobe. This would be better assessed, at least initially, with dedicated hepatics sonography or contrast enhanced CT examination. Coronary artery calcification Aortic Atherosclerosis (ICD10-I70.0). Electronically Signed    By: Fidela Salisbury MD   On: 11/19/2019 19:36   VAS [place] UPPER EXTREMITY ARTERIAL DUPLEX  Result Date: 11/09/2019 UPPER EXTREMITY DUPLEX STUDY Performing Technologist: Concha [place] RVT  Examination Guidelines: A complete evaluation includes B-mode imaging, spectral Doppler, color Doppler, and power Doppler as needed of all accessible portions of each vessel. Bilateral testing is considered an integral part of a complete examination. Limited examinations  for reoccurring indications may be performed as noted.  Right Pre-Dialysis Findings: +-----------------------+----------+--------------------+---------+--------+  Location                PSV (cm/s) Intralum. Diam. (cm) Waveform  Comments  +-----------------------+----------+--------------------+---------+--------+  Brachial Antecub. fossa 120        0.61                 triphasic           +-----------------------+----------+--------------------+---------+--------+  Radial Art at Wrist     92         0.25                 triphasic           +-----------------------+----------+--------------------+---------+--------+  Ulnar Art at Wrist      119        0.20                 triphasic           +-----------------------+----------+--------------------+---------+--------+ Left Pre-Dialysis Findings: +-----------------------+----------+--------------------+---------+--------+  Location                PSV (cm/s) Intralum. Diam. (cm) Waveform  Comments  +-----------------------+----------+--------------------+---------+--------+  Brachial Antecub. fossa 130        0.66                 triphasic           +-----------------------+----------+--------------------+---------+--------+  Radial Art at Wrist     94         0.33                 triphasic           +-----------------------+----------+--------------------+---------+--------+  Ulnar Art at Wrist      131        0.22                 triphasic            +-----------------------+----------+--------------------+---------+--------+  Summary:  Right: No obstruction visualized in the right upper extremity. Left: No obstruction visualized in the left upper extremity. *See table(s) above for measurements and observations. Electronically signed by Leotis Pain MD on 11/09/2019 at 9:02:37 AM.    Final    ECHOCARDIOGRAM COMPLETE  Result Date: 11/16/2019    ECHOCARDIOGRAM REPORT   Patient Name:   Patrick Hurst Date of Exam: 11/16/2019 Medical Rec #:  [id number]      Height:       71.0 in Accession #:    [id number]     Weight:       308.6 lb Date of Birth:  [birth year]      BSA:          2.536 m Patient Age:    64 years       BP:           128/55 mmHg Patient Gender: M              HR:           74 bpm. Exam Location:  [facility] Procedure: 2D Echo, Cardiac Doppler and Color Doppler Indications:     Dyspnea 786.09  History:         Patient has prior history of Echocardiogram examinations, most                  recent 03/06/2019. CHF; Risk Factors:Sleep Apnea.  Sonographer:     Sherrie Sport RDCS (AE) Referring Phys:  [place]: Ida Rogue MD IMPRESSIONS  1. Left ventricular ejection fraction, by estimation, is 60 to 65%. The left ventricle has normal function. The left ventricle has no regional wall motion abnormalities. There is mild left ventricular hypertrophy. Left ventricular diastolic parameters are consistent with Grade II diastolic dysfunction (pseudonormalization).  2. Right ventricular systolic function is normal. The right ventricular size is normal. There is normal pulmonary artery systolic pressure. The estimated right ventricular systolic pressure is 58.5 mmHg.  3. The aortic valve is moderately calcified. Mild to moderate aortic valve sclerosis/calcification is present, without any evidence of aortic stenosis.  4. Left atrial size was mildly dilated. FINDINGS  Left Ventricle: Left ventricular ejection fraction, by estimation, is 60 to 65%. The  left ventricle has normal function. The left ventricle has no regional wall motion abnormalities. The left ventricular internal cavity size was normal in size. There is  mild left ventricular hypertrophy. Left ventricular diastolic parameters are consistent with Grade II diastolic dysfunction (pseudonormalization). Right Ventricle: The right ventricular size is normal. No increase in right ventricular wall thickness. Right ventricular systolic function is normal. There is normal pulmonary artery systolic pressure. The tricuspid regurgitant velocity is 1.97 m/s, and  with an assumed right atrial pressure of 10 mmHg, the estimated right ventricular systolic pressure is 27.7 mmHg. Left Atrium: Left atrial size was mildly dilated. Right Atrium: Right atrial size was normal in size. Pericardium: There is no evidence of pericardial effusion. Mitral Valve: The mitral valve is normal in structure. Normal mobility of the mitral valve leaflets. Mild mitral annular calcification. Mild mitral valve regurgitation. No evidence of mitral valve stenosis. Tricuspid Valve: The tricuspid valve is normal in structure. Tricuspid valve regurgitation is not demonstrated. No evidence of tricuspid stenosis. Aortic Valve: The aortic valve is normal in structure. Aortic valve regurgitation is not visualized. Mild to moderate aortic valve sclerosis/calcification is present, without any evidence of aortic stenosis. Aortic valve mean gradient measures 8.5 mmHg. Aortic valve peak gradient measures 16.6 mmHg. Aortic valve area, by VTI measures 2.29 cm. Pulmonic Valve: The pulmonic valve was normal in structure. Pulmonic valve regurgitation is not visualized. No evidence of pulmonic stenosis. Aorta: The aortic root is normal in size and structure. Venous: The inferior vena cava is normal in size with greater than 50% respiratory variability, suggesting right atrial pressure of 3 mmHg. IAS/Shunts: No atrial level shunt detected by color flow  Doppler.  LEFT VENTRICLE PLAX 2D LVIDd:         6.18 cm  Diastology LVIDs:         3.40 cm  LV e' lateral:   14.70 cm/s LV PW:         1.54 cm  LV E/e' lateral: 7.7 LV IVS:        0.93 cm  LV e' medial:    5.44 cm/s LVOT diam:     2.30 cm  LV E/e' medial:  20.8 LV SV:         97 LV SV Index:   38 LVOT Area:     4.15 cm  RIGHT VENTRICLE RV Basal diam:  3.25 cm RV S prime:     18.40 cm/s TAPSE (M-mode): 3.7 cm LEFT ATRIUM            Index       RIGHT ATRIUM           Index LA diam:      5.20 cm  2.05 cm/m  RA Area:  25.00 cm LA Vol (A2C): 187.0 ml 73.75 ml/m RA Volume:   79.50 ml  31.35 ml/m LA Vol (A4C): 112.0 ml 44.17 ml/m  AORTIC VALVE                    PULMONIC VALVE AV Area (Vmax):    2.10 cm     PV Vmax:        1.01 m/s AV Area (Vmean):   2.07 cm     PV Peak grad:   4.1 mmHg AV Area (VTI):     2.29 cm     RVOT Peak grad: 8 mmHg AV Vmax:           203.50 cm/s AV Vmean:          137.500 cm/s AV VTI:            0.422 m AV Peak Grad:      16.6 mmHg AV Mean Grad:      8.5 mmHg LVOT Vmax:         103.00 cm/s LVOT Vmean:        68.500 cm/s LVOT VTI:          0.233 m LVOT/AV VTI ratio: 0.55  AORTA Ao Root diam: 3.70 cm MITRAL VALVE                TRICUSPID VALVE MV Area (PHT): 3.20 cm     TR Peak grad:   15.5 mmHg MV Decel Time: 237 msec     TR Vmax:        197.00 cm/s MV E velocity: 113.00 cm/s MV A velocity: 100.00 cm/s  SHUNTS MV E/A ratio:  1.13         Systemic VTI:  0.23 m                             Systemic Diam: 2.30 cm Ida Rogue MD Electronically signed by Ida Rogue MD Signature Date/Time: 11/16/2019/2:31:17 PM    Final    VAS [place] UPPER EXT VEIN MAPPING (PRE-OP AVF)  Result Date: 11/09/2019 UPPER EXTREMITY VEIN MAPPING  Indications: Pre-access. History: Esrd.  Performing Technologist: Concha [place] RVT  Examination Guidelines: A complete evaluation includes B-mode imaging, spectral Doppler, color Doppler, and power Doppler as needed of all accessible portions of each vessel. Bilateral  testing is considered an integral part of a complete examination. Limited examinations for reoccurring indications may be performed as noted. +-----------------+-------------+----------+---------+  Right Cephalic    Diameter (cm) Depth (cm) Findings   +-----------------+-------------+----------+---------+  Prox upper arm        0.54                            +-----------------+-------------+----------+---------+  Mid upper arm         0.56                 branching  +-----------------+-------------+----------+---------+  Dist upper arm        0.52                            +-----------------+-------------+----------+---------+  Antecubital fossa     0.56                            +-----------------+-------------+----------+---------+  Prox forearm  0.82                            +-----------------+-------------+----------+---------+  Mid forearm           0.47                 branching  +-----------------+-------------+----------+---------+  Dist forearm          0.38                            +-----------------+-------------+----------+---------+ +-----------------+-------------+----------+--------+  Right Basilic     Diameter (cm) Depth (cm) Findings  +-----------------+-------------+----------+--------+  Prox upper arm        0.72                           +-----------------+-------------+----------+--------+  Mid upper arm         0.45                           +-----------------+-------------+----------+--------+  Dist upper arm        0.45                           +-----------------+-------------+----------+--------+  Antecubital fossa     0.44                           +-----------------+-------------+----------+--------+  Prox forearm          0.43                           +-----------------+-------------+----------+--------+ +-----------------+-------------+----------+--------+  Left Cephalic     Diameter (cm) Depth (cm) Findings  +-----------------+-------------+----------+--------+  Prox  upper arm        0.60                           +-----------------+-------------+----------+--------+  Mid upper arm         0.50                           +-----------------+-------------+----------+--------+  Dist upper arm        0.48                           +-----------------+-------------+----------+--------+  Antecubital fossa     0.73                           +-----------------+-------------+----------+--------+  Prox forearm          0.44                           +-----------------+-------------+----------+--------+  Mid forearm           0.49                           +-----------------+-------------+----------+--------+  Dist forearm          0.41                           +-----------------+-------------+----------+--------+ +-----------------+-------------+----------+--------+  Left Basilic      Diameter (cm) Depth (cm) Findings  +-----------------+-------------+----------+--------+  Prox upper arm        0.64                           +-----------------+-------------+----------+--------+  Mid upper arm         0.57                           +-----------------+-------------+----------+--------+  Dist upper arm        0.67                           +-----------------+-------------+----------+--------+  Antecubital fossa     0.62                           +-----------------+-------------+----------+--------+  Prox forearm          0.43                           +-----------------+-------------+----------+--------+ Summary: Right: Normal caliber and compressible veins throughout. Left: Normal caliber and compressible veins throughout. *See table(s) above for measurements and observations.  Diagnosing physician: Leotis Pain MD Electronically signed by Leotis Pain MD on 11/09/2019 at 9:02:34 AM.    Final    US Abdomen Limited RUQ  Result Date: 11/20/2019 CLINICAL DATA:  Liver lesion. EXAM: ULTRASOUND ABDOMEN LIMITED RIGHT UPPER QUADRANT COMPARISON:  Chest CT 11/19/2019. Right upper quadrant abdominal  ultrasound 05/08/2016. FINDINGS: Gallbladder: Surgically absent. Common bile duct: Diameter: 4 mm Liver: Background increased parenchymal echogenicity diffusely. Approximately 7.8 x 5.3 x 7.6 cm lesion anteriorly in the liver with heterogeneous posterior shadowing and a small amount of internal vascularity. The lesion demonstrates heterogeneous echogenicity but is primarily hyperechoic. Portal vein is patent on color Doppler imaging with normal direction of blood flow towards the liver. Other: None. IMPRESSION: Indeterminate solid liver mass. When the patient is clinically stable and able to follow directions and hold their breath (preferably as an outpatient), further evaluation with a contrast-enhanced abdominal MRI is recommended. Electronically Signed   By: Logan Bores M.D.   On: 11/20/2019 11:53       ASSESSMENT/PLAN   Acute on chronic hypoxemic respiratory failure -due to CAP present on admission - possibly due to bacteria such as strep pneumo vs viral etiology - will place procal trending, RVP negative, nasal MRSA pcr-negative -repeat respiratory cultures-not acceptable specimen -COVID negative x 6 over past 1 year - cardio/renal/hospitalist service on case - appreciate collaboration  - Rocephin/doxy -CT chest - bilateral pna worse on left lung as above   Acute blood loss anemia  - hb<7  - s/p transfusion   - monitoring h/h  - contributing to hypoxemia  - liver mass on [place] abd  - monitor for hemoptysis   COPD -agree with current care path  - will add MetaNEB therapy for aggressive BPH    Thank you for allowing me to participate in the care of this patient.    Patient/Family are satisfied with care plan and all questions have been answered.   This document was prepared using Dragon voice recognition software and may include unintentional dictation errors.     Ottie Glazier, M.D.  Division of [place]

## 2019-11-20 NOTE — Progress Notes (Signed)
PROGRESS NOTE    Patrick Hurst  MLY:[id number] DOB: 21-Dec-1955 DOA: 11/15/2019 PCP: [name], MD    Chief Complaint  Patient presents with  . Shortness of Breath    Brief Narrative:  64 year old with prior history of diastolic heart failure, end-stage renal disease on PD, does require intermittent HD, paroxysmal atrial fibrillation, anemia of chronic disease, depression, Bell's palsy, carpal tunnel syndrome, Hyperlipidemia, hypertension, DM, COPD, OHS, OSA on BIPAP at night, Hypothyroidism, presents to ED, with SOB.  Patient was admitted for acute respiratory failure with hypoxia secondary to a combination of bilateral pneumonia and fluid overload.  He underwent hemodialysis session once followed by peritoneal dialysis daily. Patient continues to be hypoxic requiring up to 3 L of nasal cannula oxygen.  CT chest without contrast ordered which showed Interval development of multifocal pulmonary infiltrates. Interval development of a multilobulated indeterminate lesion within the right hepatic lobe.  Pulmonologist consulted, recommended RVP, and repeat respiratory cultures, metaNEB therapy.  [place] abd showed 7.8 x 5.3 x 7.6 cm lesion anteriorly in the liver.  Will need further work-up with an MRI with contrast for evaluation of the liver mass.  His hospital course complicated by guaiac positive stools and anemia of 6.4 and 6.6.  He underwent 1 unit of PRBC transfusion. GI consulted for evaluation. He will probably need colonoscopy/ EGD.   Pt seen and examined today. Reports he feels the same as yesterday.     Assessment & Plan:   Principal Problem:   Respiratory failure with hypoxia (HCC) Active Problems:   Diabetes mellitus with neuropathy (HCC)   Hypertension   Hypothyroidism   Anemia, iron deficiency   Chronic diastolic CHF (congestive heart failure) (HCC)   COPD (chronic obstructive pulmonary disease) (HCC)   ESRD on dialysis ([place])   CAP (community acquired  pneumonia)    Acute respiratory failure with hypoxia probably secondary to combination of bilateral pneumonia  And fluid overload from inadequate peritoneal dialysis and acute on chronic diastolic heart failure.  Patient currently is on 3 L of nasal cannula oxygen to keep sats greater than 90%.  He was started on IV Rocephin and doxycycline  for the community-acquired pneumonia, completed 5 days of treatment. Blood cultures have been negative so far. Sputum cultures are pending.  CT chest without contrast showed Interval development of multifocal pulmonary infiltrates.MRSA PCR is negative.  Respiratory panel is negative. Further recommendations as per Pulmonologist.    Fluid overload/ acute on chronic diastolic heart failure.  He initially had one session of HD, followed by PD daily.  He was also started on IV lasix 40 mg BID without much diuresis and changed to oral lasix today.  Strict intake and output and daily weights.  Echocardiogram showed persevered left ventricular ejection fracture and grade 2 diastolic dysfunction. Nephrology consulted for HD, underwent  HD session on 11/16/19 and 3.5 lit of fluid removed followed by PD everyday.   He will probably need another HD session, if he continues to be hypoxic.    Anemia of chronic disease:  Hemoglobin at baseline between 8 to 9. On admission hemoglobin around 6.6. s/p 1 unit of prbc transfusion ordered.  Hemoglobin improved to 8. No h/o rectal bleeding or melena.  Stool for occult blood positive, GI consulted for colonoscopy/EGD.  Anemia panel ordered for further evaluation showed iron level of 32, ferritin of 627, folate of 14.8 and vitamin B 12 of  428.  Iron on board, Patient on PPI twice daily   ESRD on  PD and on HD intermittently.  Appreciate nephrology recommendations.  Patient will probably need another hemodialysis session if his hypoxia does not improve.  Plan for PD tonight.     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 CBG (last 3)  Recent Labs    11/20/19 0826 11/20/19 1153 11/20/19 1716  GLUCAP 250* 207* 171*    A1c is 5.8 Restarted home meds and SSI.      Essential hypertension:  Well controlled.  Currently on Lasix 40 mg daily, hydralazine 10 mg 3 times daily, metoprolol 25 mg twice daily   OHS and OSA on BIPAP at night  Continue the same.    Body mass index is 43.05 kg/m. Morbid obesity Poor prognostic factor.     Hypothyroidism:  Resume synthroid.     Hyperlipidemia:  Resume statin.   PAF:  Rate controlled with amiodarone and metoprolol 25 mg twice daily eliquis on hold for guiac positive stools.  Patient CHA2DS2-VASc score is 3.    Acquired thrombophilia Was on Eliquis for stroke prophylaxis which is on hold for GI bleed.   COPD: Diminished air entry at bases.  No wheezing heard at this time.   DVT prophylaxis: SCD'S code Status: Full code Family Communication: family  at bedside Disposition:   Status is: Inpatient  Remains inpatient appropriate because:Ongoing diagnostic testing needed not appropriate for outpatient work up and Inpatient level of care appropriate due to severity of illness   Dispo: The patient is from: Home              Anticipated d/c is to: Pending              Anticipated d/c date is: 2 days              Patient currently is not medically stable to d/c.       Consultants:   Nephrology  Gastroenterology  Pulmonologist  Procedures: Hemodialysis  Antimicrobials:  Anti-infectives (From admission, onward)   Start     Dose/Rate Route Frequency Ordered Stop   11/16/19 1930  azithromycin (ZITHROMAX) 500 mg in sodium chloride 0.9 % 250 mL IVPB  Status:  Discontinued        500 mg 250 mL/hr over 60 Minutes Intravenous Every 24 hours 11/15/19 1931 11/16/19 1417   11/16/19 1900  cefTRIAXone (ROCEPHIN) 2 g in sodium chloride 0.9 % 100 mL IVPB        2 g 200 mL/hr over 30 Minutes  Intravenous Every 24 hours 11/15/19 1933 11/20/19 0646   11/16/19 1800  doxycycline (VIBRA-TABS) tablet 100 mg     Discontinue     100 mg Oral Every 12 hours 11/16/19 1417     11/15/19 2000  cefTRIAXone (ROCEPHIN) 1 g in sodium chloride 0.9 % 100 mL IVPB        1 g 200 mL/hr over 30 Minutes Intravenous  Once 11/15/19 1949 11/15/19 2200   11/15/19 1945  cefTRIAXone (ROCEPHIN) 2 g in sodium chloride 0.9 % 100 mL IVPB  Status:  Discontinued        2 g 200 mL/hr over 30 Minutes Intravenous Every 24 hours 11/15/19 1931 11/15/19 1945   11/15/19 1830  cefTRIAXone (ROCEPHIN) 1 g in sodium chloride 0.9 % 100 mL IVPB        1 g 200 mL/hr over 30 Minutes Intravenous  Once 11/15/19 1822 11/15/19 1921   11/15/19 1830  azithromycin (ZITHROMAX) 500 mg in sodium chloride 0.9 % 250 mL IVPB  500 mg 250 mL/hr over 60 Minutes Intravenous  Once 11/15/19 1822 11/15/19 2046          Subjective: Patient frustrated that he was not able to get help during the night.  Patient denies any chest pain or shortness of breath at this time  Objective: Vitals:   11/20/19 0804 11/20/19 0846 11/20/19 1249 11/20/19 1609  BP: (!) 152/73  (!) 107/46 136/60  Pulse: 72  65 67  Resp: 17  12 16   Temp: 98.3 F (36.8 C)  98.3 F (36.8 C) 97.9 F (36.6 C)  TempSrc:   Oral Oral  SpO2: (!) 86% 91% 93% 99%  Weight:      Height:        Intake/Output Summary (Last 24 hours) at 11/20/2019 1726 Last data filed at 11/20/2019 1330 Gross per 24 hour  Intake 480 ml  Output 800 ml  Net -320 ml   Filed Weights   11/15/19 1500  Weight: (!) 140 kg    Examination:  General exam alert, oriented and answering all questions appropriately, not in any kind of distress. Respiratory system: Diminished air entry at bases, no tachypnea no wheezing or rhonchi Cardiovascular system: S1-S2 heard, regular rate rhythm, no JVD, trace leg edema present Gastrointestinal system: Abdomen is soft, nontender, nondistended, bowel sounds  normal Central nervous system: Alert and oriented, grossly nonfocal Extremities: No cyanosis or clubbing Skin: No rashes seen Psychiatry: Mood is appropriate   Data Reviewed: I have personally reviewed following labs and imaging studies  CBC: Recent Labs  Lab 11/15/19 1504 11/16/19 0159 11/17/19 0315 11/18/19 0357 11/19/19 0843  WBC 8.3 7.3 7.3 7.8 6.3  NEUTROABS  --  5.2  --   --   --   HGB 6.4* 6.6* 7.3* 7.5* 8.0*  HCT 19.6* 20.6* 21.6* 22.0* 24.0*  MCV 95.1 95.4 90.8 89.4 91.6  PLT 120* 137* 138* 129* 125*    Basic Metabolic Panel: Recent Labs  Lab 11/16/19 0159 11/17/19 0315 11/17/19 2035 11/18/19 0357 11/19/19 0843 11/20/19 0959  NA 137 138  --  136 137 136  K 5.1 4.2  --  4.0 4.5 4.7  CL 95* 95*  --  94* 94* 94*  CO2 22 27  --  24 25 26   GLUCOSE 201* 139*  --  169* 243* 274*  BUN 109* 77*  --  89* 91* 92*  CREATININE 13.08* 8.22*  --  9.38* 9.57* 10.08*  CALCIUM 8.2* 8.4*  --  8.3* 9.1 8.6*  PHOS  --   --  9.6*  --   --   --     GFR: Estimated Creatinine Clearance: 10.9 mL/min (A) (by C-G formula based on SCr of 10.08 mg/dL (H)).  Liver Function Tests: Recent Labs  Lab 11/16/19 0159 11/19/19 0843  AST 25 22  ALT 28 30  ALKPHOS 207* 250*  BILITOT 0.7 0.7  PROT 7.6 7.9  ALBUMIN 3.1* 3.3*    CBG: Recent Labs  Lab 11/19/19 1641 11/19/19 2114 11/20/19 0826 11/20/19 1153 11/20/19 1716  GLUCAP 266* 222* 250* 207* 171*     Recent Results (from the past 240 hour(s))  Blood Culture (routine x 2)     Status: None   Collection Time: 11/15/19  5:28 PM   Specimen: BLOOD  Result Value Ref Range Status   Specimen Description BLOOD BLOOD RIGHT FOREARM  Final   Special Requests   Final    BOTTLES DRAWN AEROBIC AND ANAEROBIC Blood Culture adequate volume   Culture  Final    NO GROWTH 5 DAYS Performed at [facility], [address] [place], [place] [address]    Report Status 11/20/2019 FINAL  Final  Blood Culture (routine x 2)      Status: None   Collection Time: 11/15/19  5:28 PM   Specimen: BLOOD  Result Value Ref Range Status   Specimen Description BLOOD RIGHT ANTECUBITAL  Final   Special Requests   Final    BOTTLES DRAWN AEROBIC AND ANAEROBIC Blood Culture results may not be optimal due to an inadequate volume of blood received in culture bottles   Culture   Final    NO GROWTH 5 DAYS Performed at [facility], [address], [place], [place] [address]    Report Status 11/20/2019 FINAL  Final  SARS Coronavirus 2 by RT PCR (hospital order, performed in [place] hospital lab) Nasopharyngeal Nasopharyngeal Swab     Status: None   Collection Time: 11/15/19  7:14 PM   Specimen: Nasopharyngeal Swab  Result Value Ref Range Status   SARS Coronavirus 2 NEGATIVE NEGATIVE Final    Comment: (NOTE) SARS-CoV-2 target nucleic acids are NOT DETECTED.  The SARS-CoV-2 RNA is generally detectable in upper and lower respiratory specimens during the acute phase of infection. The lowest concentration of SARS-CoV-2 viral copies this assay can detect is 250 copies / mL. A negative result does not preclude SARS-CoV-2 infection and should not be used as the sole basis for treatment or other patient management decisions.  A negative result may occur with improper specimen collection / handling, submission of specimen other than nasopharyngeal swab, presence of viral mutation(s) within the areas targeted by this assay, and inadequate number of viral copies (<250 copies / mL). A negative result must be combined with clinical observations, patient history, and epidemiological information.  Fact Sheet for Patients:   StrictlyIdeas.no  Fact Sheet for Healthcare Providers: BankingDealers.co.za  This test is not yet approved or  cleared by the [place] FDA and has been authorized for detection and/or diagnosis of SARS-CoV-2 by FDA under an Emergency Use Authorization  (EUA).  This EUA will remain in effect (meaning this test can be used) for the duration of the COVID-19 declaration under Section 564(b)(1) of the Act, 21 U.S.C. section 360bbb-3(b)(1), unless the authorization is terminated or revoked sooner.  Performed at [facility], [address] [place], [place] [address]   Expectorated sputum assessment w rflx to resp cult     Status: None   Collection Time: 11/19/19  6:00 AM   Specimen: Sputum  Result Value Ref Range Status   Specimen Description SPUTUM  Final   Special Requests NONE  Final   Sputum evaluation   Final    Sputum specimen not acceptable for testing.  Please recollect.   NOTIFIED VICTORIA BADGETT AT 0822 OF NEED FOR RECOLLECT. [facility] Performed at [facility], [address] [place], [place] [address]    Report Status 11/20/2019 FINAL  Final  Respiratory Panel by PCR     Status: None   Collection Time: 11/19/19 12:57 PM   Specimen: Nasopharyngeal Swab; Respiratory  Result Value Ref Range Status   Adenovirus NOT DETECTED NOT DETECTED Final   Coronavirus 229E NOT DETECTED NOT DETECTED Final    Comment: (NOTE) The Coronavirus on the Respiratory Panel, DOES NOT test for the novel  Coronavirus (2019 nCoV)    Coronavirus HKU1 NOT DETECTED NOT DETECTED Final   Coronavirus NL63 NOT DETECTED NOT DETECTED Final  Coronavirus OC43 NOT DETECTED NOT DETECTED Final   Metapneumovirus NOT DETECTED NOT DETECTED Final   Rhinovirus / Enterovirus NOT DETECTED NOT DETECTED Final   Influenza A NOT DETECTED NOT DETECTED Final   Influenza B NOT DETECTED NOT DETECTED Final   Parainfluenza Virus 1 NOT DETECTED NOT DETECTED Final   Parainfluenza Virus 2 NOT DETECTED NOT DETECTED Final   Parainfluenza Virus 3 NOT DETECTED NOT DETECTED Final   Parainfluenza Virus 4 NOT DETECTED NOT DETECTED Final   Respiratory Syncytial Virus NOT DETECTED NOT DETECTED Final   Bordetella pertussis NOT DETECTED NOT DETECTED Final   Chlamydophila  pneumoniae NOT DETECTED NOT DETECTED Final   Mycoplasma pneumoniae NOT DETECTED NOT DETECTED Final    Comment: Performed at [facility], [place] [address], [place], [place] [address]  MRSA PCR Screening     Status: None   Collection Time: 11/19/19 12:57 PM   Specimen: Nasopharyngeal  Result Value Ref Range Status   MRSA by PCR NEGATIVE NEGATIVE Final    Comment:        The GeneXpert MRSA Assay (FDA approved for NASAL specimens only), is one component of a comprehensive MRSA colonization surveillance program. It is not intended to diagnose MRSA infection nor to guide or monitor treatment for MRSA infections. Performed at [facility], [address] [place], [place] [address]   Expectorated sputum assessment w rflx to resp cult     Status: None   Collection Time: 11/19/19 12:58 PM   Specimen: Sputum  Result Value Ref Range Status   Specimen Description SPUTUM  Final   Special Requests SPUTUM  Final   Sputum evaluation   Final    Sputum specimen not acceptable for testing.  Please recollect.   Early Osmond NOTIFIED AT 1417 ON 11/19/19 [facility] Performed at [facility], [address] [place], [place] [address]    Report Status 11/19/2019 FINAL  Final         Radiology Studies: CT CHEST WO CONTRAST  Result Date: 11/19/2019 CLINICAL DATA:  Dyspnea, chronic, diastolic heart failure, end-stage renal disease EXAM: CT CHEST WITHOUT CONTRAST TECHNIQUE: Multidetector CT imaging of the chest was performed following the standard protocol without IV contrast. COMPARISON:  06/19/2019 FINDINGS: Cardiovascular: Extensive coronary artery calcification is noted, predominantly within the left main and left anterior descending coronary artery. Cardiac size is at the upper limits of normal. No pericardial effusion. Central pulmonary arteries are enlarged in keeping with changes of pulmonary arterial hypertension. The thoracic aorta is of normal caliber. Right internal jugular  central venous catheter tip is noted within the right atrium. Mediastinum/Nodes: No pathologic thoracic adenopathy. Lungs/Pleura: Since the prior examination, there has developed extensive bilateral asymmetric airspace infiltrates, more severe within the superior segment of the left lower lobe, likely infectious or inflammatory in the acute setting. No superimposed significant pulmonary edema. No pneumothorax or pleural effusion. The central airways are widely patent. Upper Abdomen: Cholecystectomy has been performed. Since the prior examination, there has developed a multilobulated low-attenuation lesion within segment 4 B of the liver which is indeterminate. This may represent a developing hepatic mass or complex fluid collection such as a hepatic abscess. This measures roughly 4.1 x 6.9 cm on axial image # 134/2. A trace amount of perihepatic ascites has developed. Musculoskeletal: No lytic or blastic bone lesion is identified. IMPRESSION: Interval development of multifocal pulmonary infiltrates, most in keeping with atypical infection in the acute setting. Interval development of a multilobulated indeterminate lesion within the right  hepatic lobe. This would be better assessed, at least initially, with dedicated hepatics sonography or contrast enhanced CT examination. Coronary artery calcification Aortic Atherosclerosis (ICD10-I70.0). Electronically Signed   By: Fidela Salisbury MD   On: 11/19/2019 19:36   US Abdomen Limited RUQ  Result Date: 11/20/2019 CLINICAL DATA:  Liver lesion. EXAM: ULTRASOUND ABDOMEN LIMITED RIGHT UPPER QUADRANT COMPARISON:  Chest CT 11/19/2019. Right upper quadrant abdominal ultrasound 05/08/2016. FINDINGS: Gallbladder: Surgically absent. Common bile duct: Diameter: 4 mm Liver: Background increased parenchymal echogenicity diffusely. Approximately 7.8 x 5.3 x 7.6 cm lesion anteriorly in the liver with heterogeneous posterior shadowing and a small amount of internal vascularity. The  lesion demonstrates heterogeneous echogenicity but is primarily hyperechoic. Portal vein is patent on color Doppler imaging with normal direction of blood flow towards the liver. Other: None. IMPRESSION: Indeterminate solid liver mass. When the patient is clinically stable and able to follow directions and hold their breath (preferably as an outpatient), further evaluation with a contrast-enhanced abdominal MRI is recommended. Electronically Signed   By: Logan Bores M.D.   On: 11/20/2019 11:53        Scheduled Meds: . sodium chloride   Intravenous Once  . amiodarone  200 mg Oral Daily  . amitriptyline  25 mg Oral QHS  . vitamin C  1,000 mg Oral BID  . aspirin EC  81 mg Oral Daily  . atorvastatin  40 mg Oral Daily  . calcitRIOL  0.5 mcg Oral Daily  . calcium acetate  1,334 mg Oral TID WC  . Chlorhexidine Gluconate Cloth  6 each Topical Q0600  . doxycycline  100 mg Oral Q12H  . febuxostat  80 mg Oral Daily  . [START ON 11/21/2019] ferrous sulfate  325 mg Oral BID WC  . fluticasone furoate-vilanterol  1 puff Inhalation Daily  . furosemide  40 mg Oral Daily  . gentamicin cream  1 application Topical Daily  . hydrALAZINE  10 mg Oral TID  . insulin aspart  0-6 Units Subcutaneous TID WC  . insulin regular human CONCENTRATED  15 Units Subcutaneous BID WC  . ipratropium-albuterol  3 mL Inhalation Daily  . lactulose  10 g Oral Daily  . levothyroxine  100 mcg Oral Q0600  . loratadine  10 mg Oral Daily  . metoprolol tartrate  25 mg Oral BID  . pantoprazole  40 mg Oral BID  . sodium chloride flush  3 mL Intravenous Q12H   Continuous Infusions: . sodium chloride 250 mL (11/18/19 1814)  . sodium chloride    . sodium chloride    . dialysis solution 2.5% low-MG/low-CA Stopped (11/20/19 0449)     LOS: 5 days        Hosie Poisson, MD [facility]   To contact the attending provider between 7A-7P or the covering provider during after hours 7P-7A, please log into the web site  [email or web address] and access using universal [place] password for that web site. If you do not have the password, please call the hospital operator.  11/20/2019, 5:26 PM

## 2019-11-20 NOTE — Progress Notes (Signed)
Patient disconnected from PD int am. I-Drain- 7 ml. UF total 1314 ml and dwell time 1:56. Patient remained in bed In stable condition and presents with no c/o pain or discomfort to abdomen.

## 2019-11-20 NOTE — Progress Notes (Signed)
Pd started, exit site care provided.

## 2019-11-20 NOTE — Progress Notes (Signed)
[facility]  ROUNDING NOTE   Subjective:   Peritoneal dialysis last night. Tolerated treatment well. UF of 1363mL.  Liver lesion found on CT scan. Abdominal ultrasound for today.   Objective:  Vital signs in last 24 hours:  Temp:  [97.5 F (36.4 C)-98.3 F (36.8 C)] 98.3 F (36.8 C) (07/20 0804) Pulse Rate:  [59-75] 72 (07/20 0804) Resp:  [17-20] 17 (07/20 0804) BP: (103-152)/(50-73) 152/73 (07/20 0804) SpO2:  [86 %-97 %] 91 % (07/20 0846)  Weight change:  Filed Weights   11/15/19 1500  Weight: (!) 140 kg    Intake/Output: I/O last 3 completed shifts: In: 120 [P.O.:120] Out: 2322 [Urine:1200; Other:1122]   Intake/Output this shift:  Total I/O In: 240 [P.O.:240] Out: -   Physical Exam: General: No acute distress  Head: Normocephalic, atraumatic. Moist oral mucosal membranes  Eyes: Anicteric  Neck: Supple, trachea midline  Lungs:  Diminished on left, 3L [place] O2  Heart: regular  Abdomen:  Soft, nontender, bowel sounds present  Extremities: no peripheral edema.  Neurologic: Awake, alert, following commands  Skin: No lesions  Access: PD catheter in place, right IJ PermCath in place, developing left upper extremity AV access    Basic Metabolic Panel: Recent Labs  Lab 11/16/19 0159 11/16/19 0159 11/17/19 0315 11/17/19 0315 11/17/19 2035 11/18/19 0357 11/19/19 0843 11/20/19 0959  NA 137  --  138  --   --  136 137 136  K 5.1  --  4.2  --   --  4.0 4.5 4.7  CL 95*  --  95*  --   --  94* 94* 94*  CO2 22  --  27  --   --  24 25 26   GLUCOSE 201*  --  139*  --   --  169* 243* 274*  BUN 109*  --  77*  --   --  89* 91* 92*  CREATININE 13.08*  --  8.22*  --   --  9.38* 9.57* 10.08*  CALCIUM 8.2*   < > 8.4*   < >  --  8.3* 9.1 8.6*  PHOS  --   --   --   --  9.6*  --   --   --    < > = values in this interval not displayed.    Liver Function Tests: Recent Labs  Lab 11/16/19 0159 11/19/19 0843  AST 25 22  ALT 28 30  ALKPHOS 207* 250*  BILITOT  0.7 0.7  PROT 7.6 7.9  ALBUMIN 3.1* 3.3*   No results for input(s): LIPASE, AMYLASE in the last 168 hours. No results for input(s): AMMONIA in the last 168 hours.  CBC: Recent Labs  Lab 11/15/19 1504 11/16/19 0159 11/17/19 0315 11/18/19 0357 11/19/19 0843  WBC 8.3 7.3 7.3 7.8 6.3  NEUTROABS  --  5.2  --   --   --   HGB 6.4* 6.6* 7.3* 7.5* 8.0*  HCT 19.6* 20.6* 21.6* 22.0* 24.0*  MCV 95.1 95.4 90.8 89.4 91.6  PLT 120* 137* 138* 129* 125*    Cardiac Enzymes: No results for input(s): CKTOTAL, CKMB, CKMBINDEX, TROPONINI in the last 168 hours.  BNP: Invalid input(s): POCBNP  CBG: Recent Labs  Lab 11/19/19 0913 11/19/19 1201 11/19/19 1641 11/19/19 2114 11/20/19 0826  GLUCAP 219* 231* 266* 222* 250*    Microbiology: Results for orders placed or performed during the hospital encounter of 11/15/19  Blood Culture (routine x 2)     Status: None  Collection Time: 11/15/19  5:28 PM   Specimen: BLOOD  Result Value Ref Range Status   Specimen Description BLOOD BLOOD RIGHT FOREARM  Final   Special Requests   Final    BOTTLES DRAWN AEROBIC AND ANAEROBIC Blood Culture adequate volume   Culture   Final    NO GROWTH 5 DAYS Performed at [facility], [address] [place], [place] [address]    Report Status 11/20/2019 FINAL  Final  Blood Culture (routine x 2)     Status: None   Collection Time: 11/15/19  5:28 PM   Specimen: BLOOD  Result Value Ref Range Status   Specimen Description BLOOD RIGHT ANTECUBITAL  Final   Special Requests   Final    BOTTLES DRAWN AEROBIC AND ANAEROBIC Blood Culture results may not be optimal due to an inadequate volume of blood received in culture bottles   Culture   Final    NO GROWTH 5 DAYS Performed at [facility], [address], [place], [place] [address]    Report Status 11/20/2019 FINAL  Final  SARS Coronavirus 2 by RT PCR (hospital order, performed in [place] hospital lab) Nasopharyngeal Nasopharyngeal Swab      Status: None   Collection Time: 11/15/19  7:14 PM   Specimen: Nasopharyngeal Swab  Result Value Ref Range Status   SARS Coronavirus 2 NEGATIVE NEGATIVE Final    Comment: (NOTE) SARS-CoV-2 target nucleic acids are NOT DETECTED.  The SARS-CoV-2 RNA is generally detectable in upper and lower respiratory specimens during the acute phase of infection. The lowest concentration of SARS-CoV-2 viral copies this assay can detect is 250 copies / mL. A negative result does not preclude SARS-CoV-2 infection and should not be used as the sole basis for treatment or other patient management decisions.  A negative result may occur with improper specimen collection / handling, submission of specimen other than nasopharyngeal swab, presence of viral mutation(s) within the areas targeted by this assay, and inadequate number of viral copies (<250 copies / mL). A negative result must be combined with clinical observations, patient history, and epidemiological information.  Fact Sheet for Patients:   StrictlyIdeas.no  Fact Sheet for Healthcare Providers: BankingDealers.co.za  This test is not yet approved or  cleared by the [place] FDA and has been authorized for detection and/or diagnosis of SARS-CoV-2 by FDA under an Emergency Use Authorization (EUA).  This EUA will remain in effect (meaning this test can be used) for the duration of the COVID-19 declaration under Section 564(b)(1) of the Act, 21 U.S.C. section 360bbb-3(b)(1), unless the authorization is terminated or revoked sooner.  Performed at [facility], [address] [place], [place] [address]   Expectorated sputum assessment w rflx to resp cult     Status: None   Collection Time: 11/19/19  6:00 AM   Specimen: Sputum  Result Value Ref Range Status   Specimen Description SPUTUM  Final   Special Requests NONE  Final   Sputum evaluation   Final    Sputum specimen not  acceptable for testing.  Please recollect.   NOTIFIED VICTORIA BADGETT AT 0822 OF NEED FOR RECOLLECT. [facility] Performed at [facility], [address] [place], [place] [address]    Report Status 11/20/2019 FINAL  Final  Respiratory Panel by PCR     Status: None   Collection Time: 11/19/19 12:57 PM   Specimen: Nasopharyngeal Swab; Respiratory  Result Value Ref Range Status   Adenovirus NOT DETECTED NOT DETECTED Final  Coronavirus 229E NOT DETECTED NOT DETECTED Final    Comment: (NOTE) The Coronavirus on the Respiratory Panel, DOES NOT test for the novel  Coronavirus (2019 nCoV)    Coronavirus HKU1 NOT DETECTED NOT DETECTED Final   Coronavirus NL63 NOT DETECTED NOT DETECTED Final   Coronavirus OC43 NOT DETECTED NOT DETECTED Final   Metapneumovirus NOT DETECTED NOT DETECTED Final   Rhinovirus / Enterovirus NOT DETECTED NOT DETECTED Final   Influenza A NOT DETECTED NOT DETECTED Final   Influenza B NOT DETECTED NOT DETECTED Final   Parainfluenza Virus 1 NOT DETECTED NOT DETECTED Final   Parainfluenza Virus 2 NOT DETECTED NOT DETECTED Final   Parainfluenza Virus 3 NOT DETECTED NOT DETECTED Final   Parainfluenza Virus 4 NOT DETECTED NOT DETECTED Final   Respiratory Syncytial Virus NOT DETECTED NOT DETECTED Final   Bordetella pertussis NOT DETECTED NOT DETECTED Final   Chlamydophila pneumoniae NOT DETECTED NOT DETECTED Final   Mycoplasma pneumoniae NOT DETECTED NOT DETECTED Final    Comment: Performed at [facility], [place] [address], [place], [place] [address]  MRSA PCR Screening     Status: None   Collection Time: 11/19/19 12:57 PM   Specimen: Nasopharyngeal  Result Value Ref Range Status   MRSA by PCR NEGATIVE NEGATIVE Final    Comment:        The GeneXpert MRSA Assay (FDA approved for NASAL specimens only), is one component of a comprehensive MRSA colonization surveillance program. It is not intended to diagnose MRSA infection nor to guide or monitor treatment  for MRSA infections. Performed at [facility], [address] [place], [place] [address]   Expectorated sputum assessment w rflx to resp cult     Status: None   Collection Time: 11/19/19 12:58 PM   Specimen: Sputum  Result Value Ref Range Status   Specimen Description SPUTUM  Final   Special Requests SPUTUM  Final   Sputum evaluation   Final    Sputum specimen not acceptable for testing.  Please recollect.   Early Osmond NOTIFIED AT 1417 ON 11/19/19 [facility] Performed at [facility], [address] [place], [place] [address]    Report Status 11/19/2019 FINAL  Final    Coagulation Studies: No results for input(s): LABPROT, INR in the last 72 hours.  Urinalysis: No results for input(s): COLORURINE, LABSPEC, PHURINE, GLUCOSEU, HGBUR, BILIRUBINUR, KETONESUR, PROTEINUR, UROBILINOGEN, NITRITE, LEUKOCYTESUR in the last 72 hours.  Invalid input(s): APPERANCEUR    Imaging: DG Chest 2 View  Result Date: 11/18/2019 CLINICAL DATA:  Acute respiratory failure with hypoxia. EXAM: CHEST - 2 VIEW COMPARISON:  November 15, 2019. FINDINGS: The heart size and mediastinal contours are within normal limits. No pneumothorax or pleural effusion is noted. Right internal jugular dialysis catheter is unchanged in position. Bilateral perihilar opacities are noted, left greater than right, consistent with pneumonia. The visualized skeletal structures are unremarkable. IMPRESSION: Grossly stable bilateral perihilar opacities, left greater than right, consistent with pneumonia. Electronically Signed   By: Marijo Conception M.D.   On: 11/18/2019 16:11   CT CHEST WO CONTRAST  Result Date: 11/19/2019 CLINICAL DATA:  Dyspnea, chronic, diastolic heart failure, end-stage renal disease EXAM: CT CHEST WITHOUT CONTRAST TECHNIQUE: Multidetector CT imaging of the chest was performed following the standard protocol without IV contrast. COMPARISON:  06/19/2019 FINDINGS: Cardiovascular: Extensive coronary artery  calcification is noted, predominantly within the left main and left anterior descending coronary artery. Cardiac size is at the upper limits of normal. No pericardial  effusion. Central pulmonary arteries are enlarged in keeping with changes of pulmonary arterial hypertension. The thoracic aorta is of normal caliber. Right internal jugular central venous catheter tip is noted within the right atrium. Mediastinum/Nodes: No pathologic thoracic adenopathy. Lungs/Pleura: Since the prior examination, there has developed extensive bilateral asymmetric airspace infiltrates, more severe within the superior segment of the left lower lobe, likely infectious or inflammatory in the acute setting. No superimposed significant pulmonary edema. No pneumothorax or pleural effusion. The central airways are widely patent. Upper Abdomen: Cholecystectomy has been performed. Since the prior examination, there has developed a multilobulated low-attenuation lesion within segment 4 B of the liver which is indeterminate. This may represent a developing hepatic mass or complex fluid collection such as a hepatic abscess. This measures roughly 4.1 x 6.9 cm on axial image # 134/2. A trace amount of perihepatic ascites has developed. Musculoskeletal: No lytic or blastic bone lesion is identified. IMPRESSION: Interval development of multifocal pulmonary infiltrates, most in keeping with atypical infection in the acute setting. Interval development of a multilobulated indeterminate lesion within the right hepatic lobe. This would be better assessed, at least initially, with dedicated hepatics sonography or contrast enhanced CT examination. Coronary artery calcification Aortic Atherosclerosis (ICD10-I70.0). Electronically Signed   By: Fidela Salisbury MD   On: 11/19/2019 19:36     Medications:   . sodium chloride 250 mL (11/18/19 1814)  . sodium chloride    . sodium chloride    . dialysis solution 2.5% low-MG/low-CA Stopped (11/20/19 0449)    . sodium chloride   Intravenous Once  . amiodarone  200 mg Oral Daily  . amitriptyline  25 mg Oral QHS  . vitamin C  1,000 mg Oral BID  . aspirin EC  81 mg Oral Daily  . atorvastatin  40 mg Oral Daily  . calcitRIOL  0.5 mcg Oral Daily  . Chlorhexidine Gluconate Cloth  6 each Topical Q0600  . doxycycline  100 mg Oral Q12H  . febuxostat  80 mg Oral Daily  . [START ON 11/21/2019] ferrous sulfate  325 mg Oral BID WC  . fluticasone furoate-vilanterol  1 puff Inhalation Daily  . furosemide  40 mg Intravenous Q12H  . gentamicin cream  1 application Topical Daily  . hydrALAZINE  10 mg Oral TID  . insulin aspart  0-6 Units Subcutaneous TID WC  . insulin regular human CONCENTRATED  15 Units Subcutaneous BID WC  . ipratropium-albuterol  3 mL Inhalation Daily  . lactulose  10 g Oral Daily  . levothyroxine  100 mcg Oral Q0600  . loratadine  10 mg Oral Daily  . metoprolol tartrate  25 mg Oral BID  . pantoprazole  40 mg Oral BID  . sodium chloride flush  3 mL Intravenous Q12H   sodium chloride, sodium chloride, sodium chloride, acetaminophen **OR** acetaminophen, albuterol, alteplase, alum & mag hydroxide-simeth, heparin, HYDROcodone-acetaminophen, lidocaine (PF), lidocaine-prilocaine, morphine injection, ondansetron (ZOFRAN) IV, pentafluoroprop-tetrafluoroeth, sodium chloride flush, traZODone  Assessment/ Plan:   [name] is a 64 y.o. white male with end stage renal disease on peritoneal dialysis, chronic diastolic heart failure, Bell's palsy, carpal tunnel syndrome, depression, GERD, gout, who is admitted to [facility] on 11/15/2019 for CAP (community acquired pneumonia) [J18.9] Acute respiratory failure with hypoxia ([place]) [J96.01] Anemia of chronic disease [D63.8] Community acquired pneumonia, unspecified laterality [J18.9]  [facility] 139kg CCPD 9 hours 4 exchanges  1. End stage renal disease: on peritoneal dialysis. Back up hemodialysis treatment  7/16. Azotemia  noted. Not currently on systemic steroids.  Did not make adequacy this month and his prescription has been changed to 5 exchanges. Will do the new prescription tonight to see how patient tolerates.   2.  Anemia of chronic kidney disease status post PRBC transfusion on 7/16. No ESA on this admission. He is scheduled for outpatient EPO and venofer as outpatient on 7/21 however will hold until liver lesion work up complete.   3. Secondary hyperparathyroidism with hyperphosphatemia:   - calcitriol  - not currently binders: start calcium acetate.   4. Hypertension: well controlled. Currently on IV furosemide, hydralazine and metoprolol.  Home regimen includes amlodipine and torsemide which are currently being held.  - Change furosemide to PO   LOS: 5 Shakya Sebring 7/20/202110:42 AM

## 2019-11-21 ENCOUNTER — Inpatient Hospital Stay: Payer: Commercial Managed Care - PPO

## 2019-11-21 DIAGNOSIS — N186 End stage renal disease: Secondary | ICD-10-CM

## 2019-11-21 DIAGNOSIS — Z992 Dependence on renal dialysis: Secondary | ICD-10-CM

## 2019-11-21 LAB — CBC WITH DIFFERENTIAL/PLATELET
Abs Immature Granulocytes: 0.39 10*3/uL — ABNORMAL HIGH (ref 0.00–0.07)
Basophils Absolute: 0.1 10*3/uL (ref 0.0–0.1)
Basophils Relative: 1 %
Eosinophils Absolute: 0.2 10*3/uL (ref 0.0–0.5)
Eosinophils Relative: 3 %
HCT: 24.2 % — ABNORMAL LOW (ref 39.0–52.0)
Hemoglobin: 8.1 g/dL — ABNORMAL LOW (ref 13.0–17.0)
Immature Granulocytes: 5 %
Lymphocytes Relative: 9 %
Lymphs Abs: 0.7 10*3/uL (ref 0.7–4.0)
MCH: 30.5 pg (ref 26.0–34.0)
MCHC: 33.5 g/dL (ref 30.0–36.0)
MCV: 91 fL (ref 80.0–100.0)
Monocytes Absolute: 0.7 10*3/uL (ref 0.1–1.0)
Monocytes Relative: 9 %
Neutro Abs: 5.6 10*3/uL (ref 1.7–7.7)
Neutrophils Relative %: 73 %
Platelets: 124 10*3/uL — ABNORMAL LOW (ref 150–400)
RBC: 2.66 MIL/uL — ABNORMAL LOW (ref 4.22–5.81)
RDW: 15.2 % (ref 11.5–15.5)
WBC: 7.7 10*3/uL (ref 4.0–10.5)
nRBC: 0 % (ref 0.0–0.2)

## 2019-11-21 LAB — BASIC METABOLIC PANEL
Anion gap: 18 — ABNORMAL HIGH (ref 5–15)
BUN: 96 mg/dL — ABNORMAL HIGH (ref 8–23)
CO2: 25 mmol/L (ref 22–32)
Calcium: 9.7 mg/dL (ref 8.9–10.3)
Chloride: 96 mmol/L — ABNORMAL LOW (ref 98–111)
Creatinine, Ser: 10.23 mg/dL — ABNORMAL HIGH (ref 0.61–1.24)
GFR calc Af Amer: 6 mL/min — ABNORMAL LOW (ref >60)
GFR calc non Af Amer: 5 mL/min — ABNORMAL LOW (ref >60)
Glucose, Bld: 257 mg/dL — ABNORMAL HIGH (ref 70–99)
Potassium: 4.3 mmol/L (ref 3.5–5.1)
Sodium: 139 mmol/L (ref 135–145)

## 2019-11-21 LAB — GLUCOSE, CAPILLARY
Glucose-Capillary: 214 mg/dL — ABNORMAL HIGH (ref 70–99)
Glucose-Capillary: 188 mg/dL — ABNORMAL HIGH (ref 70–99)
Glucose-Capillary: 170 mg/dL — ABNORMAL HIGH (ref 70–99)
Glucose-Capillary: 227 mg/dL — ABNORMAL HIGH (ref 70–99)

## 2019-11-21 MED ORDER — HYDRALAZINE HCL 50 MG PO TABS
25.0000 mg | ORAL_TABLET | Freq: Three times a day (TID) | ORAL | Status: DC
  Administered 2019-11-21 – 2019-11-23 (×7): 25 mg via ORAL
  Filled 2019-11-21 (×7): qty 1

## 2019-11-21 NOTE — Progress Notes (Signed)
Pd completed, reports no dificulties last night,

## 2019-11-21 NOTE — Progress Notes (Signed)
Patrick Darby, MD [address]  [place]  [place], [place] [address]  Main: [phone]  Fax: [phone] Pager: [phone]   Subjective: Tolerated peritoneal dialysis well.  He denies any complaints today.  He is sleeping.  His wife is bedside.  Still on 3 L oxygen  Objective: Vital signs in last 24 hours: Vitals:   11/21/19 0442 11/21/19 0500 11/21/19 0820 11/21/19 1129  BP: 140/63  (!) 157/82 (!) 157/67  Pulse: 66  70 68  Resp: 16  18 18   Temp: 98 F (36.7 C)  98.6 F (37 C) 97.6 F (36.4 C)  TempSrc: Oral  Oral Oral  SpO2: 98%  96% 95%  Weight:  (!) 137.8 kg    Height:       Weight change:   Intake/Output Summary (Last 24 hours) at 11/21/2019 1501 Last data filed at 11/21/2019 0900 Gross per 24 hour  Intake 480 ml  Output 1923 ml  Net -1443 ml     Exam: Heart:: Regular rate and rhythm, S1S2 present or without murmur or extra heart sounds Lungs: bibasilar rales Abdomen: soft, nontender, normal bowel sounds   Lab Results: CBC Latest Ref Rng & Units 11/21/2019 11/19/2019 11/18/2019  WBC 4.0 - 10.5 K/uL 7.7 6.3 7.8  Hemoglobin 13.0 - 17.0 g/dL 8.1(L) 8.0(L) 7.5(L)  Hematocrit 39 - 52 % 24.2(L) 24.0(L) 22.0(L)  Platelets 150 - 400 K/uL 124(L) 125(L) 129(L)   CMP Latest Ref Rng & Units 11/21/2019 11/20/2019 11/19/2019  Glucose 70 - 99 mg/dL 257(H) 274(H) 243(H)  BUN 8 - 23 mg/dL 96(H) 92(H) 91(H)  Creatinine 0.61 - 1.24 mg/dL 10.23(H) 10.08(H) 9.57(H)  Sodium 135 - 145 mmol/L 139 136 137  Potassium 3.5 - 5.1 mmol/L 4.3 4.7 4.5  Chloride 98 - 111 mmol/L 96(L) 94(L) 94(L)  CO2 22 - 32 mmol/L 25 26 25   Calcium 8.9 - 10.3 mg/dL 9.7 8.6(L) 9.1  Total Protein 6.5 - 8.1 g/dL - - 7.9  Total Bilirubin 0.3 - 1.2 mg/dL - - 0.7  Alkaline Phos 38 - 126 U/L - - 250(H)  AST 15 - 41 U/L - - 22  ALT 0 - 44 U/L - - 30    Micro Results: Recent Results (from the past 240 hour(s))  Blood Culture (routine x 2)     Status: None   Collection Time: 11/15/19  5:28  PM   Specimen: BLOOD  Result Value Ref Range Status   Specimen Description BLOOD BLOOD RIGHT FOREARM  Final   Special Requests   Final    BOTTLES DRAWN AEROBIC AND ANAEROBIC Blood Culture adequate volume   Culture   Final    NO GROWTH 5 DAYS Performed at [facility], [address] [place], [place] [address]    Report Status 11/20/2019 FINAL  Final  Blood Culture (routine x 2)     Status: None   Collection Time: 11/15/19  5:28 PM   Specimen: BLOOD  Result Value Ref Range Status   Specimen Description BLOOD RIGHT ANTECUBITAL  Final   Special Requests   Final    BOTTLES DRAWN AEROBIC AND ANAEROBIC Blood Culture results may not be optimal due to an inadequate volume of blood received in culture bottles   Culture   Final    NO GROWTH 5 DAYS Performed at [facility], [address], [place], [place] [address]    Report Status 11/20/2019 FINAL  Final  SARS Coronavirus 2 by RT PCR (hospital order, performed in  [facility] hospital lab) Nasopharyngeal Nasopharyngeal Swab     Status: None   Collection Time: 11/15/19  7:14 PM   Specimen: Nasopharyngeal Swab  Result Value Ref Range Status   SARS Coronavirus 2 NEGATIVE NEGATIVE Final    Comment: (NOTE) SARS-CoV-2 target nucleic acids are NOT DETECTED.  The SARS-CoV-2 RNA is generally detectable in upper and lower respiratory specimens during the acute phase of infection. The lowest concentration of SARS-CoV-2 viral copies this assay can detect is 250 copies / mL. A negative result does not preclude SARS-CoV-2 infection and should not be used as the sole basis for treatment or other patient management decisions.  A negative result may occur with improper specimen collection / handling, submission of specimen other than nasopharyngeal swab, presence of viral mutation(s) within the areas targeted by this assay, and inadequate number of viral copies (<250 copies / mL). A negative result must be combined with  clinical observations, patient history, and epidemiological information.  Fact Sheet for Patients:   StrictlyIdeas.no  Fact Sheet for Healthcare Providers: BankingDealers.co.za  This test is not yet approved or  cleared by the [place] FDA and has been authorized for detection and/or diagnosis of SARS-CoV-2 by FDA under an Emergency Use Authorization (EUA).  This EUA will remain in effect (meaning this test can be used) for the duration of the COVID-19 declaration under Section 564(b)(1) of the Act, 21 U.S.C. section 360bbb-3(b)(1), unless the authorization is terminated or revoked sooner.  Performed at [facility], [address] [place], [place] [address]   Expectorated sputum assessment w rflx to resp cult     Status: None   Collection Time: 11/19/19  6:00 AM   Specimen: Sputum  Result Value Ref Range Status   Specimen Description SPUTUM  Final   Special Requests NONE  Final   Sputum evaluation   Final    Sputum specimen not acceptable for testing.  Please recollect.   NOTIFIED VICTORIA BADGETT AT 0822 OF NEED FOR RECOLLECT. [facility] Performed at [facility], [address] [place], [place] [address]    Report Status 11/20/2019 FINAL  Final  Respiratory Panel by PCR     Status: None   Collection Time: 11/19/19 12:57 PM   Specimen: Nasopharyngeal Swab; Respiratory  Result Value Ref Range Status   Adenovirus NOT DETECTED NOT DETECTED Final   Coronavirus 229E NOT DETECTED NOT DETECTED Final    Comment: (NOTE) The Coronavirus on the Respiratory Panel, DOES NOT test for the novel  Coronavirus (2019 nCoV)    Coronavirus HKU1 NOT DETECTED NOT DETECTED Final   Coronavirus NL63 NOT DETECTED NOT DETECTED Final   Coronavirus OC43 NOT DETECTED NOT DETECTED Final   Metapneumovirus NOT DETECTED NOT DETECTED Final   Rhinovirus / Enterovirus NOT DETECTED NOT DETECTED Final   Influenza A NOT DETECTED NOT DETECTED  Final   Influenza B NOT DETECTED NOT DETECTED Final   Parainfluenza Virus 1 NOT DETECTED NOT DETECTED Final   Parainfluenza Virus 2 NOT DETECTED NOT DETECTED Final   Parainfluenza Virus 3 NOT DETECTED NOT DETECTED Final   Parainfluenza Virus 4 NOT DETECTED NOT DETECTED Final   Respiratory Syncytial Virus NOT DETECTED NOT DETECTED Final   Bordetella pertussis NOT DETECTED NOT DETECTED Final   Chlamydophila pneumoniae NOT DETECTED NOT DETECTED Final   Mycoplasma pneumoniae NOT DETECTED NOT DETECTED Final    Comment: Performed at [facility], [place], [place], [place] [address]  MRSA PCR Screening  Status: None   Collection Time: 11/19/19 12:57 PM   Specimen: Nasopharyngeal  Result Value Ref Range Status   MRSA by PCR NEGATIVE NEGATIVE Final    Comment:        The GeneXpert MRSA Assay (FDA approved for NASAL specimens only), is one component of a comprehensive MRSA colonization surveillance program. It is not intended to diagnose MRSA infection nor to guide or monitor treatment for MRSA infections. Performed at [facility], [address] [place], [place] [address]   Expectorated sputum assessment w rflx to resp cult     Status: None   Collection Time: 11/19/19 12:58 PM   Specimen: Sputum  Result Value Ref Range Status   Specimen Description SPUTUM  Final   Special Requests SPUTUM  Final   Sputum evaluation   Final    Sputum specimen not acceptable for testing.  Please recollect.   Early Osmond NOTIFIED AT 1417 ON 11/19/19 [facility] Performed at [facility], [address] [place], [place] [address]    Report Status 11/19/2019 FINAL  Final   Studies/Results: CT CHEST WO CONTRAST  Result Date: 11/19/2019 CLINICAL DATA:  Dyspnea, chronic, diastolic heart failure, end-stage renal disease EXAM: CT CHEST WITHOUT CONTRAST TECHNIQUE: Multidetector CT imaging of the chest was performed following the standard protocol without IV contrast. COMPARISON:   06/19/2019 FINDINGS: Cardiovascular: Extensive coronary artery calcification is noted, predominantly within the left main and left anterior descending coronary artery. Cardiac size is at the upper limits of normal. No pericardial effusion. Central pulmonary arteries are enlarged in keeping with changes of pulmonary arterial hypertension. The thoracic aorta is of normal caliber. Right internal jugular central venous catheter tip is noted within the right atrium. Mediastinum/Nodes: No pathologic thoracic adenopathy. Lungs/Pleura: Since the prior examination, there has developed extensive bilateral asymmetric airspace infiltrates, more severe within the superior segment of the left lower lobe, likely infectious or inflammatory in the acute setting. No superimposed significant pulmonary edema. No pneumothorax or pleural effusion. The central airways are widely patent. Upper Abdomen: Cholecystectomy has been performed. Since the prior examination, there has developed a multilobulated low-attenuation lesion within segment 4 B of the liver which is indeterminate. This may represent a developing hepatic mass or complex fluid collection such as a hepatic abscess. This measures roughly 4.1 x 6.9 cm on axial image # 134/2. A trace amount of perihepatic ascites has developed. Musculoskeletal: No lytic or blastic bone lesion is identified. IMPRESSION: Interval development of multifocal pulmonary infiltrates, most in keeping with atypical infection in the acute setting. Interval development of a multilobulated indeterminate lesion within the right hepatic lobe. This would be better assessed, at least initially, with dedicated hepatics sonography or contrast enhanced CT examination. Coronary artery calcification Aortic Atherosclerosis (ICD10-I70.0). Electronically Signed   By: Fidela Salisbury MD   On: 11/19/2019 19:36   US Abdomen Limited RUQ  Result Date: 11/20/2019 CLINICAL DATA:  Liver lesion. EXAM: ULTRASOUND ABDOMEN  LIMITED RIGHT UPPER QUADRANT COMPARISON:  Chest CT 11/19/2019. Right upper quadrant abdominal ultrasound 05/08/2016. FINDINGS: Gallbladder: Surgically absent. Common bile duct: Diameter: 4 mm Liver: Background increased parenchymal echogenicity diffusely. Approximately 7.8 x 5.3 x 7.6 cm lesion anteriorly in the liver with heterogeneous posterior shadowing and a small amount of internal vascularity. The lesion demonstrates heterogeneous echogenicity but is primarily hyperechoic. Portal vein is patent on color Doppler imaging with normal direction of blood flow towards the liver. Other: None. IMPRESSION: Indeterminate solid liver mass. When the patient is clinically stable and able  to follow directions and hold their breath (preferably as an outpatient), further evaluation with a contrast-enhanced abdominal MRI is recommended. Electronically Signed   By: Logan Bores M.D.   On: 11/20/2019 11:53   Medications:  I have reviewed the patient's current medications. Prior to Admission:  Medications Prior to Admission  Medication Sig Dispense Refill Last Dose  . albuterol (PROAIR HFA) 108 (90 Base) MCG/ACT inhaler Inhale 2 puffs into the lungs every 6 (six) hours as needed for wheezing or shortness of breath. 54 g 4 PRN at PRN  . amiodarone (PACERONE) 200 MG tablet Take 200 mg by mouth daily.   11/15/2019 at 1000  . amitriptyline (ELAVIL) 25 MG tablet TAKE 1 TABLET AT BEDTIME (Patient taking differently: Take 25 mg by mouth at bedtime. ) 90 tablet 3 11/14/2019 at 2200  . amLODipine (NORVASC) 5 MG tablet Take 1 tablet (5 mg total) by mouth daily. 90 tablet 4 11/15/2019 at 1000  . Ascorbic Acid (VITAMIN C) 1000 MG tablet Take 1,000 mg by mouth 2 (two) times daily.    11/15/2019 at 1000  . atorvastatin (LIPITOR) 40 MG tablet TAKE 1 TABLET DAILY 90 tablet 0 11/15/2019 at 1000  . calcitRIOL (ROCALTROL) 0.5 MCG capsule Take 0.5 mcg by mouth daily.   11/15/2019 at 1000  . cetirizine (ZYRTEC) 10 MG tablet Take 10 mg by  mouth daily.    11/15/2019 at 1000  . doxycycline (VIBRA-TABS) 100 MG tablet Take 1 tablet (100 mg total) by mouth 2 (two) times daily. 20 tablet 0 11/15/2019 at 1000  . ELIQUIS 2.5 MG TABS tablet Take 2.5 mg by mouth 2 (two) times daily.    11/15/2019 at 1000  . fluticasone furoate-vilanterol (BREO ELLIPTA) 100-25 MCG/INH AEPB Inhale 1 puff into the lungs daily. 180 each 3 11/15/2019 at 1000  . gentamicin cream (GARAMYCIN) 0.1 % Apply 1 application topically daily.   11/15/2019 at 1000  . hydrALAZINE (APRESOLINE) 25 MG tablet TAKE 1 TABLET THREE TIMES A DAY (Patient taking differently: Take 25 mg by mouth 3 (three) times daily. ) 270 tablet 4 11/15/2019 at 1000  . insulin regular human CONCENTRATED (HUMULIN R) 500 UNIT/ML injection Inject up to 25 units twice a day, or as directed by physician (Patient taking differently: Inject 50 Units into the skin 2 (two) times daily with a meal. 60 UNITS IN THE AM & 50 UNITS AT NIGHT) 60 mL 3 11/15/2019 at 1000  . ipratropium-albuterol (DUONEB) 0.5-2.5 (3) MG/3ML SOLN INHALE 3 ML BY NEBULIZATION EVERY 4 HOURS AS NEEDED (Patient taking differently: Inhale 3 mLs into the lungs daily. ) 360 mL 8 PRN at PRN  . lactulose (CHRONULAC) 10 GM/15ML solution Take 30 mLs by mouth daily.   Past Month at Unknown time  . levothyroxine (SYNTHROID) 100 MCG tablet TAKE 1 TABLET DAILY (Patient taking differently: Take 100 mcg by mouth daily before breakfast. ) 90 tablet 2 11/15/2019 at 1000  . metoprolol tartrate (LOPRESSOR) 50 MG tablet Take 0.5 tablets (25 mg total) by mouth 2 (two) times daily.   11/15/2019 at 1000  . pantoprazole (PROTONIX) 40 MG tablet TAKE 1 TABLET TWICE A DAY (Patient taking differently: Take 40 mg by mouth daily. ) 180 tablet 3 11/15/2019 at 1000  . testosterone cypionate (DEPO-TESTOSTERONE) 200 MG/ML injection Inject 1 mL (200 mg total) into the muscle every 14 (fourteen) days. 6 mL 1 10/28/2019 at 1000  . torsemide (DEMADEX) 20 MG tablet Take 40 mg by mouth daily.    11/15/2019 at  1000  . ULORIC 80 MG TABS Take 80 mg by mouth daily.    11/15/2019 at 1000   Scheduled: . sodium chloride   Intravenous Once  . amiodarone  200 mg Oral Daily  . amitriptyline  25 mg Oral QHS  . vitamin C  1,000 mg Oral BID  . aspirin EC  81 mg Oral Daily  . atorvastatin  40 mg Oral Daily  . calcitRIOL  0.5 mcg Oral Daily  . calcium acetate  1,334 mg Oral TID WC  . Chlorhexidine Gluconate Cloth  6 each Topical Q0600  . doxycycline  100 mg Oral Q12H  . febuxostat  80 mg Oral Daily  . fluticasone furoate-vilanterol  1 puff Inhalation Daily  . furosemide  40 mg Oral Daily  . gentamicin cream  1 application Topical Daily  . hydrALAZINE  25 mg Oral TID  . insulin aspart  0-6 Units Subcutaneous TID WC  . insulin regular human CONCENTRATED  15 Units Subcutaneous BID WC  . ipratropium-albuterol  3 mL Inhalation Daily  . lactulose  10 g Oral Daily  . levothyroxine  100 mcg Oral Q0600  . loratadine  10 mg Oral Daily  . metoprolol tartrate  25 mg Oral BID  . pantoprazole  40 mg Oral BID  . sodium chloride flush  3 mL Intravenous Q12H   Continuous: . sodium chloride 250 mL (11/18/19 1814)  . sodium chloride    . sodium chloride    . dialysis solution 2.5% low-MG/low-CA Stopped (11/21/19 8115)   BWI:OMBTDH chloride, sodium chloride, sodium chloride, acetaminophen **OR** acetaminophen, albuterol, alteplase, alum & mag hydroxide-simeth, heparin, HYDROcodone-acetaminophen, lidocaine (PF), lidocaine-prilocaine, morphine injection, ondansetron (ZOFRAN) IV, pentafluoroprop-tetrafluoroeth, sodium chloride flush, traZODone Anti-infectives (From admission, onward)   Start     Dose/Rate Route Frequency Ordered Stop   11/16/19 1930  azithromycin (ZITHROMAX) 500 mg in sodium chloride 0.9 % 250 mL IVPB  Status:  Discontinued        500 mg 250 mL/hr over 60 Minutes Intravenous Every 24 hours 11/15/19 1931 11/16/19 1417   11/16/19 1900  cefTRIAXone (ROCEPHIN) 2 g in sodium chloride 0.9 % 100  mL IVPB        2 g 200 mL/hr over 30 Minutes Intravenous Every 24 hours 11/15/19 1933 11/20/19 0646   11/16/19 1800  doxycycline (VIBRA-TABS) tablet 100 mg     Discontinue     100 mg Oral Every 12 hours 11/16/19 1417     11/15/19 2000  cefTRIAXone (ROCEPHIN) 1 g in sodium chloride 0.9 % 100 mL IVPB        1 g 200 mL/hr over 30 Minutes Intravenous  Once 11/15/19 1949 11/15/19 2200   11/15/19 1945  cefTRIAXone (ROCEPHIN) 2 g in sodium chloride 0.9 % 100 mL IVPB  Status:  Discontinued        2 g 200 mL/hr over 30 Minutes Intravenous Every 24 hours 11/15/19 1931 11/15/19 1945   11/15/19 1830  cefTRIAXone (ROCEPHIN) 1 g in sodium chloride 0.9 % 100 mL IVPB        1 g 200 mL/hr over 30 Minutes Intravenous  Once 11/15/19 1822 11/15/19 1921   11/15/19 1830  azithromycin (ZITHROMAX) 500 mg in sodium chloride 0.9 % 250 mL IVPB        500 mg 250 mL/hr over 60 Minutes Intravenous  Once 11/15/19 1822 11/15/19 2046     Scheduled Meds: . sodium chloride   Intravenous Once  . amiodarone  200 mg Oral Daily  .  amitriptyline  25 mg Oral QHS  . vitamin C  1,000 mg Oral BID  . aspirin EC  81 mg Oral Daily  . atorvastatin  40 mg Oral Daily  . calcitRIOL  0.5 mcg Oral Daily  . calcium acetate  1,334 mg Oral TID WC  . Chlorhexidine Gluconate Cloth  6 each Topical Q0600  . doxycycline  100 mg Oral Q12H  . febuxostat  80 mg Oral Daily  . fluticasone furoate-vilanterol  1 puff Inhalation Daily  . furosemide  40 mg Oral Daily  . gentamicin cream  1 application Topical Daily  . hydrALAZINE  25 mg Oral TID  . insulin aspart  0-6 Units Subcutaneous TID WC  . insulin regular human CONCENTRATED  15 Units Subcutaneous BID WC  . ipratropium-albuterol  3 mL Inhalation Daily  . lactulose  10 g Oral Daily  . levothyroxine  100 mcg Oral Q0600  . loratadine  10 mg Oral Daily  . metoprolol tartrate  25 mg Oral BID  . pantoprazole  40 mg Oral BID  . sodium chloride flush  3 mL Intravenous Q12H   Continuous  Infusions: . sodium chloride 250 mL (11/18/19 1814)  . sodium chloride    . sodium chloride    . dialysis solution 2.5% low-MG/low-CA Stopped (11/21/19 0635)   PRN Meds:.sodium chloride, sodium chloride, sodium chloride, acetaminophen **OR** acetaminophen, albuterol, alteplase, alum & mag hydroxide-simeth, heparin, HYDROcodone-acetaminophen, lidocaine (PF), lidocaine-prilocaine, morphine injection, ondansetron (ZOFRAN) IV, pentafluoroprop-tetrafluoroeth, sodium chloride flush, traZODone   Assessment: Principal Problem:   Respiratory failure with hypoxia (HCC) Active Problems:   Diabetes mellitus with neuropathy (HCC)   Hypertension   Hypothyroidism   Anemia, iron deficiency   Chronic diastolic CHF (congestive heart failure) (HCC)   COPD (chronic obstructive pulmonary disease) ([place])   ESRD on dialysis ([place])   CAP (community acquired pneumonia)  Patrick Hurst is a 64 y.o. male with morbid obesity, diastolic heart failure, metabolic syndrome, A. fib on Eliquis, NASH cirrhosis ESRD on peritoneal dialysis admitted with hypoxic respiratory failure secondary to community-acquired pneumonia. He also presented with acute on chronic anemia.  Plan: Acute on chronic anemia: Patient responded appropriately to blood transfusion Hemoglobin is stable No evidence of active GI bleed, no witnessed episodes of melena, hematemesis, coffee-ground emesis or rectal bleeding Iron studies are consistent with anemia of chronic disease, no need of oral iron. Guaiac stool positive result in inpatient setting does not carry any clinical significance, do not recommend this test as inpatient Patient is currently recovering from pneumonia and high risk for anesthesia to undergo endoscopic procedures especially he is not actively bleeding at this time Anemia is likely multifactorial, combination of infection, end-stage renal disease, anemia of chronic disease Okay to continue Protonix 40 mg p.o. twice  daily Recommend close GI follow-up with outpatient evaluation after he recovers from pneumonia  Right hepatic lobe indeterminate solid lesion Concern for [facility] given history of cirrhosis Given that patient has residual renal function, will proceed with MRI liver without contrast, discussed with nephrology AFP tumor marker pending If MRI is indeterminate or AFP is elevated, patient will need image guided liver biopsy of the indeterminate lesion  Compensated cirrhosis likely secondary to [facility] in setting of metabolic syndrome Hepatitis B and C serologies negative, patient is immune to hepatitis A, HIV nonreactive Recommend EGD for variceal screening as outpatient Tight control of diabetes Low-sodium diet Indeterminate liver lesion: Work-up as above    LOS: 6 days   Shaquanta Harkless 11/21/2019, 3:01 PM

## 2019-11-21 NOTE — Progress Notes (Signed)
Pulmonary Medicine          Date: 11/21/2019,   MRN# [id number] Patrick Hurst 1955-08-28     AdmissionWeight: (!) 140 kg                 CurrentWeight: (!) 137.8 kg   Referring physician: Dr Juleen China   CHIEF COMPLAINT:   Acute on Chronic hypoxemic respiratory failure   SUBJECTIVE   Patient is feeling better , O2 is weaned to 2.5L.  Repeat CXR today to evaluate interval changes post PD.   PAST MEDICAL HISTORY   Past Medical History:  Diagnosis Date  . Acute on chronic diastolic CHF (congestive heart failure) ([place]) 06/21/2018  . Acute respiratory failure with hypoxia ([place]) 06/21/2018  . Bell palsy 02/12/2015  . Carpal tunnel syndrome 02/12/2015  . Dependence on nocturnal oxygen therapy    2 LITERS WITH BIPAP  . Depression   . Gastric ulcer   . GERD (gastroesophageal reflux disease)   . Gout   . History of chicken pox   . Multifocal pneumonia 03/04/2019  . Sleep apnea treated with nocturnal BiPAP      SURGICAL HISTORY   Past Surgical History:  Procedure Laterality Date  . AV FISTULA PLACEMENT Left 11/08/2019   Procedure: ARTERIOVENOUS (AV) FISTULA CREATION (RADIALCEPHALIC);  Surgeon: Algernon Huxley, MD;  Location: [facility];  Service: Vascular;  Laterality: Left;  . CATARACT EXTRACTION Bilateral 2014 and 2015  . CHOLECYSTECTOMY  04/28/2011   Laproscopic; Dr. Pat Patrick  . DIALYSIS/PERMA CATHETER INSERTION N/A 05/24/2019   Procedure: DIALYSIS/PERMA CATHETER INSERTION;  Surgeon: Algernon Huxley, MD;  Location: [place] [facility];  Service: Cardiovascular;  Laterality: N/A;  . DIALYSIS/PERMA CATHETER INSERTION N/A 10/09/2019   Procedure: DIALYSIS/PERMA CATHETER INSERTION;  Surgeon: Katha Cabal, MD;  Location: [place] [facility];  Service: Cardiovascular;  Laterality: N/A;  . EYE SURGERY    . GALLBLADDER SURGERY    . LASIK Bilateral 2019   medical  . SHOULDER SURGERY Right 2012   Dr. Leanor Kail     FAMILY HISTORY   Family History  Problem Relation  Age of Onset  . COPD Mother   . Cancer Mother   . Heart disease Father      SOCIAL HISTORY   Social History   Tobacco Use  . Smoking status: Former Smoker    Packs/day: 1.00    Years: 15.00    Pack years: 15.00    Types: Cigarettes    Quit date: 05/03/1978    Years since quitting: 41.5  . Smokeless tobacco: Never Used  . Tobacco comment: started smoking at age 25  Vaping Use  . Vaping Use: Never used  Substance Use Topics  . Alcohol use: No    Alcohol/week: 0.0 standard drinks  . Drug use: No     MEDICATIONS    Home Medication:    Current Medication:  Current Facility-Administered Medications:  .  0.9 %  sodium chloride infusion (Manually program via Guardrails IV Fluids), , Intravenous, Once, Patel, Ekta V, MD .  0.9 %  sodium chloride infusion, 250 mL, Intravenous, PRN, Para Skeans, MD, Last Rate: 10 mL/hr at 11/18/19 1814, 250 mL at 11/18/19 1814 .  0.9 %  sodium chloride infusion, 100 mL, Intravenous, PRN, Lateef, Munsoor, MD .  0.9 %  sodium chloride infusion, 100 mL, Intravenous, PRN, Lateef, Munsoor, MD .  acetaminophen (TYLENOL) tablet 650 mg, 650 mg, Oral, Q6H PRN **OR** acetaminophen (TYLENOL) suppository 650  mg, 650 mg, Rectal, Q6H PRN, Florina Ou V, MD .  albuterol (PROVENTIL) (2.5 MG/3ML) 0.083% nebulizer solution 2.5 mg, 2.5 mg, Nebulization, Q2H PRN, Para Skeans, MD, 2.5 mg at 11/19/19 0221 .  alteplase (CATHFLO ACTIVASE) injection 2 mg, 2 mg, Intracatheter, Once PRN, Lateef, Munsoor, MD .  alum & mag hydroxide-simeth (MAALOX/MYLANTA) 818-299-37 MG/5ML suspension 15 mL, 15 mL, Oral, Q4H PRN, Hosie Poisson, MD, 15 mL at 11/20/19 1018 .  amiodarone (PACERONE) tablet 200 mg, 200 mg, Oral, Daily, Florina Ou V, MD, 200 mg at 11/21/19 0856 .  amitriptyline (ELAVIL) tablet 25 mg, 25 mg, Oral, QHS, Para Skeans, MD, 25 mg at 11/20/19 2226 .  ascorbic acid (VITAMIN C) tablet 1,000 mg, 1,000 mg, Oral, BID, Para Skeans, MD, 1,000 mg at 11/21/19 0855 .   aspirin EC tablet 81 mg, 81 mg, Oral, Daily, Para Skeans, MD, 81 mg at 11/21/19 0856 .  atorvastatin (LIPITOR) tablet 40 mg, 40 mg, Oral, Daily, Florina Ou V, MD, 40 mg at 11/21/19 0856 .  calcitRIOL (ROCALTROL) capsule 0.5 mcg, 0.5 mcg, Oral, Daily, Florina Ou V, MD, 0.5 mcg at 11/21/19 0858 .  calcium acetate (PHOSLO) capsule 1,334 mg, 1,334 mg, Oral, TID WC, Kolluru, Sarath, MD, 1,334 mg at 11/21/19 1731 .  Chlorhexidine Gluconate Cloth 2 % PADS 6 each, 6 each, Topical, Q0600, Holley Raring, Munsoor, MD, 6 each at 11/21/19 0636 .  dialysis solution 2.5% low-MG/low-CA dianeal solution, , Intraperitoneal, Q24H, Kolluru, Sarath, MD, Stopping Infusion hung by another clincian at 11/21/19 0635 .  doxycycline (VIBRA-TABS) tablet 100 mg, 100 mg, Oral, Q12H, Hosie Poisson, MD, 100 mg at 11/21/19 0856 .  febuxostat (ULORIC) tablet 80 mg, 80 mg, Oral, Daily, Para Skeans, MD, 80 mg at 11/21/19 0857 .  fluticasone furoate-vilanterol (BREO ELLIPTA) 100-25 MCG/INH 1 puff, 1 puff, Inhalation, Daily, Florina Ou V, MD, 1 puff at 11/21/19 0900 .  furosemide (LASIX) tablet 40 mg, 40 mg, Oral, Daily, Kolluru, Sarath, MD, 40 mg at 11/21/19 0856 .  gentamicin cream (GARAMYCIN) 0.1 % 1 application, 1 application, Topical, Daily, Kolluru, Sarath, MD, 1 application at 16/96/78 0859 .  heparin injection 1,000 Units, 1,000 Units, Dialysis, PRN, Lateef, Munsoor, MD .  hydrALAZINE (APRESOLINE) tablet 25 mg, 25 mg, Oral, TID, Mariel Aloe, MD, 25 mg at 11/21/19 1730 .  HYDROcodone-acetaminophen (NORCO/VICODIN) 5-325 MG per tablet 1-2 tablet, 1-2 tablet, Oral, Q4H PRN, Florina Ou V, MD .  insulin aspart (novoLOG) injection 0-6 Units, 0-6 Units, Subcutaneous, TID WC, Benita Gutter, RPH, 1 Units at 11/21/19 1731 .  insulin regular human CONCENTRATED (HUMULIN R) 500 UNIT/ML kwikpen 15 Units, 15 Units, Subcutaneous, BID WC, Hosie Poisson, MD, 15 Units at 11/21/19 1736 .  ipratropium-albuterol (DUONEB) 0.5-2.5 (3) MG/3ML  nebulizer solution 3 mL, 3 mL, Inhalation, Daily, Florina Ou V, MD, 3 mL at 11/21/19 0749 .  lactulose (CHRONULAC) 10 GM/15ML solution 10 g, 10 g, Oral, Daily, Florina Ou V, MD, 10 g at 11/21/19 0857 .  levothyroxine (SYNTHROID) tablet 100 mcg, 100 mcg, Oral, Q0600, Para Skeans, MD, 100 mcg at 11/21/19 0648 .  lidocaine (PF) (XYLOCAINE) 1 % injection 5 mL, 5 mL, Intradermal, PRN, Lateef, Munsoor, MD .  lidocaine-prilocaine (EMLA) cream 1 application, 1 application, Topical, PRN, Lateef, Munsoor, MD .  loratadine (CLARITIN) tablet 10 mg, 10 mg, Oral, Daily, Florina Ou V, MD, 10 mg at 11/21/19 0856 .  metoprolol tartrate (LOPRESSOR) tablet 25 mg, 25 mg, Oral, BID, Posey Pronto, Gretta Cool,  MD, 25 mg at 11/21/19 0856 .  morphine 2 MG/ML injection 2 mg, 2 mg, Intravenous, Q2H PRN, Para Skeans, MD .  ondansetron [facility]) injection 4 mg, 4 mg, Intravenous, Q6H PRN, Sharion Settler, NP, 4 mg at 11/19/19 0321 .  pantoprazole (PROTONIX) EC tablet 40 mg, 40 mg, Oral, BID, Hosie Poisson, MD, 40 mg at 11/21/19 0855 .  pentafluoroprop-tetrafluoroeth (GEBAUERS) aerosol 1 application, 1 application, Topical, PRN, Lateef, Munsoor, MD .  sodium chloride flush (NS) 0.9 % injection 3 mL, 3 mL, Intravenous, Q12H, Para Skeans, MD, 3 mL at 11/21/19 0859 .  sodium chloride flush (NS) 0.9 % injection 3 mL, 3 mL, Intravenous, PRN, Para Skeans, MD .  traZODone (DESYREL) tablet 50 mg, 50 mg, Oral, QHS PRN, Sharion Settler, NP, 50 mg at 11/17/19 2210    ALLERGIES   Mucinex [guaifenesin er] and Levaquin [levofloxacin]     REVIEW OF SYSTEMS    Review of Systems:  Gen:  Denies  fever, sweats, chills weigh loss  HEENT: Denies blurred vision, double vision, ear pain, eye pain, hearing loss, nose bleeds, sore throat Cardiac:  No dizziness, chest pain or heaviness, chest tightness,edema Resp:   Denies cough or sputum porduction, shortness of breath,wheezing, hemoptysis,  Gi: Denies swallowing difficulty, stomach  pain, nausea or vomiting, diarrhea, constipation, bowel incontinence Gu:  Denies bladder incontinence, burning urine Ext:   Denies Joint pain, stiffness or swelling Skin: Denies  skin rash, easy bruising or bleeding or hives Endoc:  Denies polyuria, polydipsia , polyphagia or weight change Psych:   Denies depression, insomnia or hallucinations   Other:  All other systems negative   VS: BP (!) 143/106 (BP Location: Right Arm)   Pulse 61   Temp 98.2 F (36.8 C) (Oral)   Resp 15   Ht 5\' 11"  (1.803 m)   Wt (!) 137.8 kg   SpO2 97%   BMI 42.37 kg/m      PHYSICAL EXAM    GENERAL:NAD, no fevers, chills, no weakness no fatigue HEAD: Normocephalic, atraumatic.  EYES: Pupils equal, round, reactive to light. Extraocular muscles intact. No scleral icterus.  MOUTH: Moist mucosal membrane. Dentition intact. No abscess noted.  EAR, NOSE, THROAT: Clear without exudates. No external lesions.  NECK: Supple. No thyromegaly. No nodules. No JVD.  PULMONARY: Diffuse coarse rhonchi right sided +wheezes CARDIOVASCULAR: S1 and S2. Regular rate and rhythm. No murmurs, rubs, or gallops. No edema. Pedal pulses 2+ bilaterally.  GASTROINTESTINAL: Soft, nontender, nondistended. No masses. Positive bowel sounds. No hepatosplenomegaly.  MUSCULOSKELETAL: No swelling, clubbing, or edema. Range of motion full in all extremities.  NEUROLOGIC: Cranial nerves II through XII are intact. No gross focal neurological deficits. Sensation intact. Reflexes intact.  SKIN: No ulceration, lesions, rashes, or cyanosis. Skin warm and dry. Turgor intact.  PSYCHIATRIC: Mood, affect within normal limits. The patient is awake, alert and oriented x 3. Insight, judgment intact.       IMAGING    DG Chest 2 View  Result Date: 11/18/2019 CLINICAL DATA:  Acute respiratory failure with hypoxia. EXAM: CHEST - 2 VIEW COMPARISON:  November 15, 2019. FINDINGS: The heart size and mediastinal contours are within normal limits. No  pneumothorax or pleural effusion is noted. Right internal jugular dialysis catheter is unchanged in position. Bilateral perihilar opacities are noted, left greater than right, consistent with pneumonia. The visualized skeletal structures are unremarkable. IMPRESSION: Grossly stable bilateral perihilar opacities, left greater than right, consistent with pneumonia. Electronically Signed   By: Jeneen Rinks  Murlean Caller M.D.   On: 11/18/2019 16:11   DG Chest 2 View  Result Date: 11/15/2019 CLINICAL DATA:  Short of breath, pneumonia EXAM: CHEST - 2 VIEW COMPARISON:  11/12/2019 FINDINGS: Frontal and lateral views of the chest demonstrate persistent bilateral perihilar airspace disease, slightly improved on the right since prior study but with significant progression on the left. There is no effusion or pneumothorax. Cardiac silhouette is enlarged but stable. Stable right internal jugular dialysis catheter. No acute bony abnormalities. IMPRESSION: 1. Bilateral perihilar airspace disease, left greater than right. There has been progression in the left since prior study. Differential includes bilateral pneumonia versus asymmetric edema. Electronically Signed   By: Randa Ngo M.D.   On: 11/15/2019 15:37   DG Chest 2 View  Result Date: 11/12/2019 CLINICAL DATA:  Shortness of breath for 4-5 days. EXAM: CHEST - 2 VIEW COMPARISON:  Single-view of the chest 03/07/2019. FINDINGS: Patchy airspace disease in the mid and lower lung zones bilaterally is worse on the left. Heart size is normal. No pneumothorax or pleural effusion. Right IJ approach dialysis catheter noted. IMPRESSION: Patchy bilateral airspace disease consistent with pneumonia. Electronically Signed   By: Inge Rise M.D.   On: 11/12/2019 15:34   CT CHEST WO CONTRAST  Result Date: 11/19/2019 CLINICAL DATA:  Dyspnea, chronic, diastolic heart failure, end-stage renal disease EXAM: CT CHEST WITHOUT CONTRAST TECHNIQUE: Multidetector CT imaging of the chest was  performed following the standard protocol without IV contrast. COMPARISON:  06/19/2019 FINDINGS: Cardiovascular: Extensive coronary artery calcification is noted, predominantly within the left main and left anterior descending coronary artery. Cardiac size is at the upper limits of normal. No pericardial effusion. Central pulmonary arteries are enlarged in keeping with changes of pulmonary arterial hypertension. The thoracic aorta is of normal caliber. Right internal jugular central venous catheter tip is noted within the right atrium. Mediastinum/Nodes: No pathologic thoracic adenopathy. Lungs/Pleura: Since the prior examination, there has developed extensive bilateral asymmetric airspace infiltrates, more severe within the superior segment of the left lower lobe, likely infectious or inflammatory in the acute setting. No superimposed significant pulmonary edema. No pneumothorax or pleural effusion. The central airways are widely patent. Upper Abdomen: Cholecystectomy has been performed. Since the prior examination, there has developed a multilobulated low-attenuation lesion within segment 4 B of the liver which is indeterminate. This may represent a developing hepatic mass or complex fluid collection such as a hepatic abscess. This measures roughly 4.1 x 6.9 cm on axial image # 134/2. A trace amount of perihepatic ascites has developed. Musculoskeletal: No lytic or blastic bone lesion is identified. IMPRESSION: Interval development of multifocal pulmonary infiltrates, most in keeping with atypical infection in the acute setting. Interval development of a multilobulated indeterminate lesion within the right hepatic lobe. This would be better assessed, at least initially, with dedicated hepatics sonography or contrast enhanced CT examination. Coronary artery calcification Aortic Atherosclerosis (ICD10-I70.0). Electronically Signed   By: Fidela Salisbury MD   On: 11/19/2019 19:36   VAS [place] UPPER EXTREMITY ARTERIAL  DUPLEX  Result Date: 11/09/2019 UPPER EXTREMITY DUPLEX STUDY Performing Technologist: Concha Norway RVT  Examination Guidelines: A complete evaluation includes B-mode imaging, spectral Doppler, color Doppler, and power Doppler as needed of all accessible portions of each vessel. Bilateral testing is considered an integral part of a complete examination. Limited examinations for reoccurring indications may be performed as noted.  Right Pre-Dialysis Findings: +-----------------------+----------+--------------------+---------+--------+ Location               PSV (  cm/s)Intralum. Diam. (cm)Waveform Comments +-----------------------+----------+--------------------+---------+--------+ Brachial Antecub. fossa120       0.61                triphasic         +-----------------------+----------+--------------------+---------+--------+ Radial Art at Wrist    92        0.25                triphasic         +-----------------------+----------+--------------------+---------+--------+ Ulnar Art at Wrist     119       0.20                triphasic         +-----------------------+----------+--------------------+---------+--------+ Left Pre-Dialysis Findings: +-----------------------+----------+--------------------+---------+--------+ Location               PSV (cm/s)Intralum. Diam. (cm)Waveform Comments +-----------------------+----------+--------------------+---------+--------+ Brachial Antecub. fossa130       0.66                triphasic         +-----------------------+----------+--------------------+---------+--------+ Radial Art at Wrist    94        0.33                triphasic         +-----------------------+----------+--------------------+---------+--------+ Ulnar Art at Wrist     131       0.22                triphasic         +-----------------------+----------+--------------------+---------+--------+  Summary:  Right: No obstruction visualized in the right upper extremity.  Left: No obstruction visualized in the left upper extremity. *See table(s) above for measurements and observations. Electronically signed by Leotis Pain MD on 11/09/2019 at 9:02:37 AM.    Final    ECHOCARDIOGRAM COMPLETE  Result Date: 11/16/2019    ECHOCARDIOGRAM REPORT   Patient Name:   Patrick Hurst Date of Exam: 11/16/2019 Medical Rec #:  [id number]      Height:       71.0 in Accession #:    [id number]     Weight:       308.6 lb Date of Birth:  04-Sep-1955      BSA:          2.536 m Patient Age:    38 years       BP:           128/55 mmHg Patient Gender: M              HR:           74 bpm. Exam Location:  [facility] Procedure: 2D Echo, Cardiac Doppler and Color Doppler Indications:     Dyspnea 786.09  History:         Patient has prior history of Echocardiogram examinations, most                  recent 03/06/2019. CHF; Risk Factors:Sleep Apnea.  Sonographer:     Sherrie Sport RDCS (AE) Referring Phys:  Theola Sequin Diagnosing Phys: Ida Rogue MD IMPRESSIONS  1. Left ventricular ejection fraction, by estimation, is 60 to 65%. The left ventricle has normal function. The left ventricle has no regional wall motion abnormalities. There is mild left ventricular hypertrophy. Left ventricular diastolic parameters are consistent with Grade II diastolic dysfunction (pseudonormalization).  2. Right ventricular systolic function is normal. The right ventricular size is normal. There is normal pulmonary artery systolic pressure. The estimated  right ventricular systolic pressure is 82.8 mmHg.  3. The aortic valve is moderately calcified. Mild to moderate aortic valve sclerosis/calcification is present, without any evidence of aortic stenosis.  4. Left atrial size was mildly dilated. FINDINGS  Left Ventricle: Left ventricular ejection fraction, by estimation, is 60 to 65%. The left ventricle has normal function. The left ventricle has no regional wall motion abnormalities. The left ventricular internal cavity size was normal in  size. There is  mild left ventricular hypertrophy. Left ventricular diastolic parameters are consistent with Grade II diastolic dysfunction (pseudonormalization). Right Ventricle: The right ventricular size is normal. No increase in right ventricular wall thickness. Right ventricular systolic function is normal. There is normal pulmonary artery systolic pressure. The tricuspid regurgitant velocity is 1.97 m/s, and  with an assumed right atrial pressure of 10 mmHg, the estimated right ventricular systolic pressure is 00.3 mmHg. Left Atrium: Left atrial size was mildly dilated. Right Atrium: Right atrial size was normal in size. Pericardium: There is no evidence of pericardial effusion. Mitral Valve: The mitral valve is normal in structure. Normal mobility of the mitral valve leaflets. Mild mitral annular calcification. Mild mitral valve regurgitation. No evidence of mitral valve stenosis. Tricuspid Valve: The tricuspid valve is normal in structure. Tricuspid valve regurgitation is not demonstrated. No evidence of tricuspid stenosis. Aortic Valve: The aortic valve is normal in structure. Aortic valve regurgitation is not visualized. Mild to moderate aortic valve sclerosis/calcification is present, without any evidence of aortic stenosis. Aortic valve mean gradient measures 8.5 mmHg. Aortic valve peak gradient measures 16.6 mmHg. Aortic valve area, by VTI measures 2.29 cm. Pulmonic Valve: The pulmonic valve was normal in structure. Pulmonic valve regurgitation is not visualized. No evidence of pulmonic stenosis. Aorta: The aortic root is normal in size and structure. Venous: The inferior vena cava is normal in size with greater than 50% respiratory variability, suggesting right atrial pressure of 3 mmHg. IAS/Shunts: No atrial level shunt detected by color flow Doppler.  LEFT VENTRICLE PLAX 2D LVIDd:         6.18 cm  Diastology LVIDs:         3.40 cm  LV e' lateral:   14.70 cm/s LV PW:         1.54 cm  LV E/e' lateral:  7.7 LV IVS:        0.93 cm  LV e' medial:    5.44 cm/s LVOT diam:     2.30 cm  LV E/e' medial:  20.8 LV SV:         97 LV SV Index:   38 LVOT Area:     4.15 cm  RIGHT VENTRICLE RV Basal diam:  3.25 cm RV S prime:     18.40 cm/s TAPSE (M-mode): 3.7 cm LEFT ATRIUM            Index       RIGHT ATRIUM           Index LA diam:      5.20 cm  2.05 cm/m  RA Area:     25.00 cm LA Vol (A2C): 187.0 ml 73.75 ml/m RA Volume:   79.50 ml  31.35 ml/m LA Vol (A4C): 112.0 ml 44.17 ml/m  AORTIC VALVE                    PULMONIC VALVE AV Area (Vmax):    2.10 cm     PV Vmax:        1.01 m/s AV  Area (Vmean):   2.07 cm     PV Peak grad:   4.1 mmHg AV Area (VTI):     2.29 cm     RVOT Peak grad: 8 mmHg AV Vmax:           203.50 cm/s AV Vmean:          137.500 cm/s AV VTI:            0.422 m AV Peak Grad:      16.6 mmHg AV Mean Grad:      8.5 mmHg LVOT Vmax:         103.00 cm/s LVOT Vmean:        68.500 cm/s LVOT VTI:          0.233 m LVOT/AV VTI ratio: 0.55  AORTA Ao Root diam: 3.70 cm MITRAL VALVE                TRICUSPID VALVE MV Area (PHT): 3.20 cm     TR Peak grad:   15.5 mmHg MV Decel Time: 237 msec     TR Vmax:        197.00 cm/s MV E velocity: 113.00 cm/s MV A velocity: 100.00 cm/s  SHUNTS MV E/A ratio:  1.13         Systemic VTI:  0.23 m                             Systemic Diam: 2.30 cm Ida Rogue MD Electronically signed by Ida Rogue MD Signature Date/Time: 11/16/2019/2:31:17 PM    Final    VAS [place] UPPER EXT VEIN MAPPING (PRE-OP AVF)  Result Date: 11/09/2019 UPPER EXTREMITY VEIN MAPPING  Indications: Pre-access. History: Esrd.  Performing Technologist: Concha Norway RVT  Examination Guidelines: A complete evaluation includes B-mode imaging, spectral Doppler, color Doppler, and power Doppler as needed of all accessible portions of each vessel. Bilateral testing is considered an integral part of a complete examination. Limited examinations for reoccurring indications may be performed as noted.  +-----------------+-------------+----------+---------+ Right Cephalic   Diameter (cm)Depth (cm)Findings  +-----------------+-------------+----------+---------+ Prox upper arm       0.54                         +-----------------+-------------+----------+---------+ Mid upper arm        0.56               branching +-----------------+-------------+----------+---------+ Dist upper arm       0.52                         +-----------------+-------------+----------+---------+ Antecubital fossa    0.56                         +-----------------+-------------+----------+---------+ Prox forearm         0.82                         +-----------------+-------------+----------+---------+ Mid forearm          0.47               branching +-----------------+-------------+----------+---------+ Dist forearm         0.38                         +-----------------+-------------+----------+---------+ +-----------------+-------------+----------+--------+ Right Basilic    Diameter (  cm)Depth (cm)Findings +-----------------+-------------+----------+--------+ Prox upper arm       0.72                        +-----------------+-------------+----------+--------+ Mid upper arm        0.45                        +-----------------+-------------+----------+--------+ Dist upper arm       0.45                        +-----------------+-------------+----------+--------+ Antecubital fossa    0.44                        +-----------------+-------------+----------+--------+ Prox forearm         0.43                        +-----------------+-------------+----------+--------+ +-----------------+-------------+----------+--------+ Left Cephalic    Diameter (cm)Depth (cm)Findings +-----------------+-------------+----------+--------+ Prox upper arm       0.60                        +-----------------+-------------+----------+--------+ Mid upper arm        0.50                         +-----------------+-------------+----------+--------+ Dist upper arm       0.48                        +-----------------+-------------+----------+--------+ Antecubital fossa    0.73                        +-----------------+-------------+----------+--------+ Prox forearm         0.44                        +-----------------+-------------+----------+--------+ Mid forearm          0.49                        +-----------------+-------------+----------+--------+ Dist forearm         0.41                        +-----------------+-------------+----------+--------+ +-----------------+-------------+----------+--------+ Left Basilic     Diameter (cm)Depth (cm)Findings +-----------------+-------------+----------+--------+ Prox upper arm       0.64                        +-----------------+-------------+----------+--------+ Mid upper arm        0.57                        +-----------------+-------------+----------+--------+ Dist upper arm       0.67                        +-----------------+-------------+----------+--------+ Antecubital fossa    0.62                        +-----------------+-------------+----------+--------+ Prox forearm         0.43                        +-----------------+-------------+----------+--------+  Summary: Right: Normal caliber and compressible veins throughout. Left: Normal caliber and compressible veins throughout. *See table(s) above for measurements and observations.  Diagnosing physician: Leotis Pain MD Electronically signed by Leotis Pain MD on 11/09/2019 at 9:02:34 AM.    Final    US Abdomen Limited RUQ  Result Date: 11/20/2019 CLINICAL DATA:  Liver lesion. EXAM: ULTRASOUND ABDOMEN LIMITED RIGHT UPPER QUADRANT COMPARISON:  Chest CT 11/19/2019. Right upper quadrant abdominal ultrasound 05/08/2016. FINDINGS: Gallbladder: Surgically absent. Common bile duct: Diameter: 4 mm Liver: Background increased parenchymal echogenicity  diffusely. Approximately 7.8 x 5.3 x 7.6 cm lesion anteriorly in the liver with heterogeneous posterior shadowing and a small amount of internal vascularity. The lesion demonstrates heterogeneous echogenicity but is primarily hyperechoic. Portal vein is patent on color Doppler imaging with normal direction of blood flow towards the liver. Other: None. IMPRESSION: Indeterminate solid liver mass. When the patient is clinically stable and able to follow directions and hold their breath (preferably as an outpatient), further evaluation with a contrast-enhanced abdominal MRI is recommended. Electronically Signed   By: Logan Bores M.D.   On: 11/20/2019 11:53       ASSESSMENT/PLAN   Acute on chronic hypoxemic respiratory failure -due to CAP present on admission - possibly due to bacteria such as strep pneumo vs viral etiology - will place procal trending, RVP negative, nasal MRSA pcr-negative -repeat respiratory cultures-not acceptable specimen -COVID negative x 6 over past 1 year - cardio/renal/hospitalist service on case - appreciate collaboration  - Rocephin/doxy -CT chest - bilateral pna worse on left lung as above   Acute blood loss anemia  - hb<7  - s/p transfusion   - monitoring h/h  - contributing to hypoxemia  - liver mass on [place] abd  - monitor for hemoptysis   COPD -agree with current care path  - will add MetaNEB therapy for aggressive BPH    Thank you for allowing me to participate in the care of this patient.    Patient/Family are satisfied with care plan and all questions have been answered.   This document was prepared using Dragon voice recognition software and may include unintentional dictation errors.     Ottie Glazier, M.D.  Division of [place]

## 2019-11-21 NOTE — Progress Notes (Signed)
[facility]  ROUNDING NOTE   Subjective:   Peritoneal dialysis last night. Tolerated treatment well.. Patient states his shortness of breath has improved Wife at bedside.   Objective:  Vital signs in last 24 hours:  Temp:  [97.9 F (36.6 C)-98.6 F (37 C)] 98.6 F (37 C) (07/21 0820) Pulse Rate:  [65-70] 70 (07/21 0820) Resp:  [12-20] 18 (07/21 0820) BP: (107-171)/(46-160) 157/82 (07/21 0820) SpO2:  [93 %-99 %] 96 % (07/21 0820) Weight:  [137.8 kg] 137.8 kg (07/21 0500)  Weight change:  Filed Weights   11/15/19 1500 11/21/19 0500  Weight: (!) 140 kg (!) 137.8 kg    Intake/Output: I/O last 3 completed shifts: In: 59 [P.O.:720] Out: 1200 [Urine:1200]   Intake/Output this shift:  Total I/O In: 240 [P.O.:240] Out: 1523 [Other:1523]  Physical Exam: General: No acute distress  Head: Normocephalic, atraumatic. Moist oral mucosal membranes  Eyes: Anicteric  Neck: Supple, trachea midline  Lungs:  Diminished on left, 6L Patrick Hurst O2  Heart: regular  Abdomen:  Soft, nontender, bowel sounds present  Extremities: no peripheral edema.  Neurologic: Awake, alert, following commands  Skin: No lesions  Access: PD catheter in place, right IJ PermCath in place, developing left upper extremity AV access    Basic Metabolic Panel: Recent Labs  Lab 11/17/19 0315 11/17/19 0315 11/17/19 2035 11/18/19 0357 11/18/19 0357 11/19/19 0843 11/20/19 0959 11/21/19 0439  NA 138  --   --  136  --  137 136 139  K 4.2  --   --  4.0  --  4.5 4.7 4.3  CL 95*  --   --  94*  --  94* 94* 96*  CO2 27  --   --  24  --  25 26 25   GLUCOSE 139*  --   --  169*  --  243* 274* 257*  BUN 77*  --   --  89*  --  91* 92* 96*  CREATININE 8.22*  --   --  9.38*  --  9.57* 10.08* 10.23*  CALCIUM 8.4*   < >  --  8.3*   < > 9.1 8.6* 9.7  PHOS  --   --  9.6*  --   --   --   --   --    < > = values in this interval not displayed.    Liver Function Tests: Recent Labs  Lab 11/16/19 0159  11/19/19 0843  AST 25 22  ALT 28 30  ALKPHOS 207* 250*  BILITOT 0.7 0.7  PROT 7.6 7.9  ALBUMIN 3.1* 3.3*   No results for input(s): LIPASE, AMYLASE in the last 168 hours. No results for input(s): AMMONIA in the last 168 hours.  CBC: Recent Labs  Lab 11/16/19 0159 11/17/19 0315 11/18/19 0357 11/19/19 0843 11/21/19 0439  WBC 7.3 7.3 7.8 6.3 7.7  NEUTROABS 5.2  --   --   --  5.6  HGB 6.6* 7.3* 7.5* 8.0* 8.1*  HCT 20.6* 21.6* 22.0* 24.0* 24.2*  MCV 95.4 90.8 89.4 91.6 91.0  PLT 137* 138* 129* 125* 124*    Cardiac Enzymes: No results for input(s): CKTOTAL, CKMB, CKMBINDEX, TROPONINI in the last 168 hours.  BNP: Invalid input(s): POCBNP  CBG: Recent Labs  Lab 11/20/19 0826 11/20/19 1153 11/20/19 1716 11/20/19 2119 11/21/19 0751  GLUCAP 250* 207* 171* 246* 214*    Microbiology: Results for orders placed or performed during the hospital encounter of 11/15/19  Blood Culture (routine x 2)  Status: None   Collection Time: 11/15/19  5:28 PM   Specimen: BLOOD  Result Value Ref Range Status   Specimen Description BLOOD BLOOD RIGHT FOREARM  Final   Special Requests   Final    BOTTLES DRAWN AEROBIC AND ANAEROBIC Blood Culture adequate volume   Culture   Final    NO GROWTH 5 DAYS Performed at [facility], [address] [place], [place] [address]    Report Status 11/20/2019 FINAL  Final  Blood Culture (routine x 2)     Status: None   Collection Time: 11/15/19  5:28 PM   Specimen: BLOOD  Result Value Ref Range Status   Specimen Description BLOOD RIGHT ANTECUBITAL  Final   Special Requests   Final    BOTTLES DRAWN AEROBIC AND ANAEROBIC Blood Culture results may not be optimal due to an inadequate volume of blood received in culture bottles   Culture   Final    NO GROWTH 5 DAYS Performed at [facility], [address] [place], [place] [address]    Report Status 11/20/2019 FINAL  Final  SARS Coronavirus 2 by RT PCR (hospital order,  performed in [place] hospital lab) Nasopharyngeal Nasopharyngeal Swab     Status: None   Collection Time: 11/15/19  7:14 PM   Specimen: Nasopharyngeal Swab  Result Value Ref Range Status   SARS Coronavirus 2 NEGATIVE NEGATIVE Final    Comment: (NOTE) SARS-CoV-2 target nucleic acids are NOT DETECTED.  The SARS-CoV-2 RNA is generally detectable in upper and lower respiratory specimens during the acute phase of infection. The lowest concentration of SARS-CoV-2 viral copies this assay can detect is 250 copies / mL. A negative result does not preclude SARS-CoV-2 infection and should not be used as the sole basis for treatment or other patient management decisions.  A negative result may occur with improper specimen collection / handling, submission of specimen other than nasopharyngeal swab, presence of viral mutation(s) within the areas targeted by this assay, and inadequate number of viral copies (<250 copies / mL). A negative result must be combined with clinical observations, patient history, and epidemiological information.  Fact Sheet for Patients:   StrictlyIdeas.no  Fact Sheet for Healthcare Providers: BankingDealers.co.za  This test is not yet approved or  cleared by the [place] FDA and has been authorized for detection and/or diagnosis of SARS-CoV-2 by FDA under an Emergency Use Authorization (EUA).  This EUA will remain in effect (meaning this test can be used) for the duration of the COVID-19 declaration under Section 564(b)(1) of the Act, 21 U.S.C. section 360bbb-3(b)(1), unless the authorization is terminated or revoked sooner.  Performed at [facility], [address] [place], [place] [address]   Expectorated sputum assessment w rflx to resp cult     Status: None   Collection Time: 11/19/19  6:00 AM   Specimen: Sputum  Result Value Ref Range Status   Specimen Description SPUTUM  Final   Special  Requests NONE  Final   Sputum evaluation   Final    Sputum specimen not acceptable for testing.  Please recollect.   NOTIFIED VICTORIA BADGETT AT 0822 OF NEED FOR RECOLLECT. [facility] Performed at [facility], [address] [place], [place] [address]    Report Status 11/20/2019 FINAL  Final  Respiratory Panel by PCR     Status: None   Collection Time: 11/19/19 12:57 PM   Specimen: Nasopharyngeal Swab; Respiratory  Result Value Ref Range Status   Adenovirus NOT  DETECTED NOT DETECTED Final   Coronavirus 229E NOT DETECTED NOT DETECTED Final    Comment: (NOTE) The Coronavirus on the Respiratory Panel, DOES NOT test for the novel  Coronavirus (2019 nCoV)    Coronavirus HKU1 NOT DETECTED NOT DETECTED Final   Coronavirus NL63 NOT DETECTED NOT DETECTED Final   Coronavirus OC43 NOT DETECTED NOT DETECTED Final   Metapneumovirus NOT DETECTED NOT DETECTED Final   Rhinovirus / Enterovirus NOT DETECTED NOT DETECTED Final   Influenza A NOT DETECTED NOT DETECTED Final   Influenza B NOT DETECTED NOT DETECTED Final   Parainfluenza Virus 1 NOT DETECTED NOT DETECTED Final   Parainfluenza Virus 2 NOT DETECTED NOT DETECTED Final   Parainfluenza Virus 3 NOT DETECTED NOT DETECTED Final   Parainfluenza Virus 4 NOT DETECTED NOT DETECTED Final   Respiratory Syncytial Virus NOT DETECTED NOT DETECTED Final   Bordetella pertussis NOT DETECTED NOT DETECTED Final   Chlamydophila pneumoniae NOT DETECTED NOT DETECTED Final   Mycoplasma pneumoniae NOT DETECTED NOT DETECTED Final    Comment: Performed at [facility], [place] [address], [place], [place] [address]  MRSA PCR Screening     Status: None   Collection Time: 11/19/19 12:57 PM   Specimen: Nasopharyngeal  Result Value Ref Range Status   MRSA by PCR NEGATIVE NEGATIVE Final    Comment:        The GeneXpert MRSA Assay (FDA approved for NASAL specimens only), is one component of a comprehensive MRSA colonization surveillance program. It is  not intended to diagnose MRSA infection nor to guide or monitor treatment for MRSA infections. Performed at [facility], [address] [place], [place] [address]   Expectorated sputum assessment w rflx to resp cult     Status: None   Collection Time: 11/19/19 12:58 PM   Specimen: Sputum  Result Value Ref Range Status   Specimen Description SPUTUM  Final   Special Requests SPUTUM  Final   Sputum evaluation   Final    Sputum specimen not acceptable for testing.  Please recollect.   Early Osmond NOTIFIED AT 1417 ON 11/19/19 [facility] Performed at [facility], [address] [place], [place] [address]    Report Status 11/19/2019 FINAL  Final    Coagulation Studies: No results for input(s): LABPROT, INR in the last 72 hours.  Urinalysis: No results for input(s): COLORURINE, LABSPEC, PHURINE, GLUCOSEU, HGBUR, BILIRUBINUR, KETONESUR, PROTEINUR, UROBILINOGEN, NITRITE, LEUKOCYTESUR in the last 72 hours.  Invalid input(s): APPERANCEUR    Imaging: CT CHEST WO CONTRAST  Result Date: 11/19/2019 CLINICAL DATA:  Dyspnea, chronic, diastolic heart failure, end-stage renal disease EXAM: CT CHEST WITHOUT CONTRAST TECHNIQUE: Multidetector CT imaging of the chest was performed following the standard protocol without IV contrast. COMPARISON:  06/19/2019 FINDINGS: Cardiovascular: Extensive coronary artery calcification is noted, predominantly within the left main and left anterior descending coronary artery. Cardiac size is at the upper limits of normal. No pericardial effusion. Central pulmonary arteries are enlarged in keeping with changes of pulmonary arterial hypertension. The thoracic aorta is of normal caliber. Right internal jugular central venous catheter tip is noted within the right atrium. Mediastinum/Nodes: No pathologic thoracic adenopathy. Lungs/Pleura: Since the prior examination, there has developed extensive bilateral asymmetric airspace infiltrates, more severe within the  superior segment of the left lower lobe, likely infectious or inflammatory in the acute setting. No superimposed significant pulmonary edema. No pneumothorax or pleural effusion. The central airways are widely patent. Upper Abdomen: Cholecystectomy has been performed. Since the  prior examination, there has developed a multilobulated low-attenuation lesion within segment 4 B of the liver which is indeterminate. This may represent a developing hepatic mass or complex fluid collection such as a hepatic abscess. This measures roughly 4.1 x 6.9 cm on axial image # 134/2. A trace amount of perihepatic ascites has developed. Musculoskeletal: No lytic or blastic bone lesion is identified. IMPRESSION: Interval development of multifocal pulmonary infiltrates, most in keeping with atypical infection in the acute setting. Interval development of a multilobulated indeterminate lesion within the right hepatic lobe. This would be better assessed, at least initially, with dedicated hepatics sonography or contrast enhanced CT examination. Coronary artery calcification Aortic Atherosclerosis (ICD10-I70.0). Electronically Signed   By: Fidela Salisbury MD   On: 11/19/2019 19:36   US Abdomen Limited RUQ  Result Date: 11/20/2019 CLINICAL DATA:  Liver lesion. EXAM: ULTRASOUND ABDOMEN LIMITED RIGHT UPPER QUADRANT COMPARISON:  Chest CT 11/19/2019. Right upper quadrant abdominal ultrasound 05/08/2016. FINDINGS: Gallbladder: Surgically absent. Common bile duct: Diameter: 4 mm Liver: Background increased parenchymal echogenicity diffusely. Approximately 7.8 x 5.3 x 7.6 cm lesion anteriorly in the liver with heterogeneous posterior shadowing and a small amount of internal vascularity. The lesion demonstrates heterogeneous echogenicity but is primarily hyperechoic. Portal vein is patent on color Doppler imaging with normal direction of blood flow towards the liver. Other: None. IMPRESSION: Indeterminate solid liver mass. When the patient is  clinically stable and able to follow directions and hold their breath (preferably as an outpatient), further evaluation with a contrast-enhanced abdominal MRI is recommended. Electronically Signed   By: Logan Bores M.D.   On: 11/20/2019 11:53     Medications:   . sodium chloride 250 mL (11/18/19 1814)  . sodium chloride    . sodium chloride    . dialysis solution 2.5% low-MG/low-CA Stopped (11/21/19 5102)   . sodium chloride   Intravenous Once  . amiodarone  200 mg Oral Daily  . amitriptyline  25 mg Oral QHS  . vitamin C  1,000 mg Oral BID  . aspirin EC  81 mg Oral Daily  . atorvastatin  40 mg Oral Daily  . calcitRIOL  0.5 mcg Oral Daily  . calcium acetate  1,334 mg Oral TID WC  . Chlorhexidine Gluconate Cloth  6 each Topical Q0600  . doxycycline  100 mg Oral Q12H  . febuxostat  80 mg Oral Daily  . fluticasone furoate-vilanterol  1 puff Inhalation Daily  . furosemide  40 mg Oral Daily  . gentamicin cream  1 application Topical Daily  . hydrALAZINE  10 mg Oral TID  . insulin aspart  0-6 Units Subcutaneous TID WC  . insulin regular human CONCENTRATED  15 Units Subcutaneous BID WC  . ipratropium-albuterol  3 mL Inhalation Daily  . lactulose  10 g Oral Daily  . levothyroxine  100 mcg Oral Q0600  . loratadine  10 mg Oral Daily  . metoprolol tartrate  25 mg Oral BID  . pantoprazole  40 mg Oral BID  . sodium chloride flush  3 mL Intravenous Q12H   sodium chloride, sodium chloride, sodium chloride, acetaminophen **OR** acetaminophen, albuterol, alteplase, alum & mag hydroxide-simeth, heparin, HYDROcodone-acetaminophen, lidocaine (PF), lidocaine-prilocaine, morphine injection, ondansetron (ZOFRAN) IV, pentafluoroprop-tetrafluoroeth, sodium chloride flush, traZODone  Assessment/ Plan:   Patrick Hurst is a 64 y.o. white male with end stage renal disease on peritoneal dialysis, chronic diastolic heart failure, Bell's palsy, carpal tunnel syndrome, depression, GERD, gout, who is  admitted to [facility] on 11/15/2019 for CAP (  community acquired pneumonia) [J18.9] Acute respiratory failure with hypoxia ([place]) [J96.01] Anemia of chronic disease [D63.8] Community acquired pneumonia, unspecified laterality [J18.9]  [facility] 139kg CCPD 9 hours 5 exchanges  1. End stage renal disease: on peritoneal dialysis. Back up hemodialysis treatment 7/16. Azotemia noted. Not currently on systemic steroids.  Did not make adequacy this month and his prescription has been changed to 5 exchanges.  - peritoneal dialysis for tonight.   2.  Anemia of chronic kidney disease status post PRBC transfusion on 7/16. No ESA on this admission. He is scheduled for outpatient EPO and venofer as outpatient on 7/21 however will hold until liver lesion work up complete.   3. Secondary hyperparathyroidism with hyperphosphatemia:   - calcitriol  - starteded calcium acetate this admission   4. Hypertension: well controlled. Currently on furosemide, hydralazine and metoprolol.  Home regimen includes amlodipine and torsemide which are currently being held.   5. Liver mass:  - MRI for today.  - Appreciate GI input.    LOS: 6 Fabrizio Filip 7/21/202111:23 AM

## 2019-11-21 NOTE — Progress Notes (Signed)
Pd started, exit site care performed.

## 2019-11-21 NOTE — Progress Notes (Signed)
PROGRESS NOTE    Patrick Hurst  OIN:[id number] DOB: Sep 23, 1955 DOA: 11/15/2019 PCP: [name], MD   Brief Narrative: Patrick Hurst is a 64 y.o. male with a history of diastolic heart failure, end-stage renal disease on PD, does require intermittent HD, paroxysmal atrial fibrillation, anemia of chronic disease, depression, Bell's palsy, carpal tunnel syndrome, Hyperlipidemia, hypertension, DM, COPD, OHS, OSA on BIPAP at night, Hypothyroidism. Patient presented secondary to dyspnea and found to have pneumonia and fluid overload from heart failure/ESRD. Patient managed with antibiotics, HD/PD and lasix with slow improvement in symptoms.   Assessment & Plan:   Principal Problem:   Respiratory failure with hypoxia (HCC) Active Problems:   Diabetes mellitus with neuropathy (HCC)   Hypertension   Hypothyroidism   Anemia, iron deficiency   Chronic diastolic CHF (congestive heart failure) (HCC)   COPD (chronic obstructive pulmonary disease) (HCC)   ESRD on dialysis ([place])   CAP (community acquired pneumonia)   Community acquired pneumonia Multifocal pneumonia RVP and COVID-19 negative. MRSA pcr negative. Pulmonology consulted for management. Patient managed on Ceftriaxone and doxycycline and is improving clinically. -Continue Ceftriaxone/Doxycycline  Acute respiratory failure with hypoxia Secondary to above with concern for concomitant fluid overload from ESRD requiring PD and HD.  Right hepatic lobe lesion Unsure of etiology. GI consulted. Concern for [place]. -GI recommendations: MRI , AFP  Cirrhosis of liver Secondary to NASH per GI in setting of metabolic syndrome. Negative for hepatitis B and C. -GI recommendations: outpatient EGD, low sodium diet  Diabetes mellitus, type 2 Hemoglobin A1C of 5.8% and well controlled. History of diabetic retinopathy and peripheral edema. -Continue SSI  Hypothyroidism -Continue Synthroid  Acute on chronic diastolic heart  failure Management with Lasix, HD an dPD. Transthoracic Echocardiogram significant for preserved EF and grade 2 diastolic dysfunction. Fluid status appears improved.  Anemia of chronic disease Patient required 1 unit of PRBC for a hemoglobin of 6.6 and is now stable  ESRD on PD/intermittent HD Patient underwent AV fistula creation by vascular surgery on 7/8. Site appears non-infected.  -Nephrology recommendations: PD nightly  Paroxysmal atrial fibrillation -Continue amiodarone and metoprolol  Acquired thrombophilia Previously on Eliquis for stroke prophylaxis which is held secondary to concern for GI bleed  COPD Stable.  Obesity Body mass index is 42.37 kg/m.   DVT prophylaxis: SCDs Code Status:   Code Status: Full Code Family Communication: Wife at bedside Disposition Plan: Discharge in several days pending improvement of pneumonia, wean to room air, specialist recommendations   Consultants:   Nephrology  Pulmonology  GI  Procedures:   TRANSTHORACIC ECHOCARDIOGRAM (11/16/2019) IMPRESSIONS    1. Left ventricular ejection fraction, by estimation, is 60 to 65%. The  left ventricle has normal function. The left ventricle has no regional  wall motion abnormalities. There is mild left ventricular hypertrophy.  Left ventricular diastolic parameters  are consistent with Grade II diastolic dysfunction (pseudonormalization).  2. Right ventricular systolic function is normal. The right ventricular  size is normal. There is normal pulmonary artery systolic pressure. The  estimated right ventricular systolic pressure is 70.9 mmHg.  3. The aortic valve is moderately calcified. Mild to moderate aortic  valve sclerosis/calcification is present, without any evidence of aortic  stenosis.  4. Left atrial size was mildly dilated.   Antimicrobials:  Ceftriaxone  Azithromycin  Doxycycline    Subjective: Cough which is improved. Dyspnea improving. Still requiring  oxygen.  Objective: Vitals:   11/21/19 0500 11/21/19 0820 11/21/19 1129 11/21/19 1528  BP:  Marland Kitchen)  157/82 (!) 157/67 (!) 143/106  Pulse:  70 68 61  Resp:  18 18 15   Temp:  98.6 F (37 C) 97.6 F (36.4 C) 98.2 F (36.8 C)  TempSrc:  Oral Oral Oral  SpO2:  96% 95% 97%  Weight: (!) 137.8 kg     Height:        Intake/Output Summary (Last 24 hours) at 11/21/2019 1735 Last data filed at 11/21/2019 1230 Gross per 24 hour  Intake 720 ml  Output 1923 ml  Net -1203 ml   Filed Weights   11/15/19 1500 11/21/19 0500  Weight: (!) 140 kg (!) 137.8 kg    Examination:  General exam: Appears calm and comfortable Respiratory system: Diminished Respiratory effort normal. Cardiovascular system: S1 & S2 heard, RRR. No murmurs, rubs, gallops or clicks. Gastrointestinal system: Abdomen is nondistended, soft and nontender. No organomegaly or masses felt. Normal bowel sounds heard. Central nervous system: Alert and oriented. No focal neurological deficits. Musculoskeletal: No edema. No calf tenderness Skin: No cyanosis. No rashes. Left forearm fistula incision appears without significant erythema or purulent drainage Psychiatry: Judgement and insight appear normal. Mood & affect appropriate.     Data Reviewed: I have personally reviewed following labs and imaging studies  CBC Lab Results  Component Value Date   WBC 7.7 11/21/2019   RBC 2.66 (L) 11/21/2019   HGB 8.1 (L) 11/21/2019   HCT 24.2 (L) 11/21/2019   MCV 91.0 11/21/2019   MCH 30.5 11/21/2019   PLT 124 (L) 11/21/2019   MCHC 33.5 11/21/2019   RDW 15.2 11/21/2019   LYMPHSABS 0.7 11/21/2019   MONOABS 0.7 11/21/2019   EOSABS 0.2 11/21/2019   BASOSABS 0.1 53/61/4431     Last metabolic panel Lab Results  Component Value Date   NA 139 11/21/2019   K 4.3 11/21/2019   CL 96 (L) 11/21/2019   CO2 25 11/21/2019   BUN 96 (H) 11/21/2019   CREATININE 10.23 (H) 11/21/2019   GLUCOSE 257 (H) 11/21/2019   GFRNONAA 5 (L) 11/21/2019    GFRAA 6 (L) 11/21/2019   CALCIUM 9.7 11/21/2019   PHOS 9.6 (H) 11/17/2019   PROT 7.9 11/19/2019   ALBUMIN 3.3 (L) 11/19/2019   LABGLOB 2.2 02/20/2018   AGRATIO 1.7 02/20/2018   BILITOT 0.7 11/19/2019   ALKPHOS 250 (H) 11/19/2019   AST 22 11/19/2019   ALT 30 11/19/2019   ANIONGAP 18 (H) 11/21/2019    CBG (last 3)  Recent Labs    11/21/19 0751 11/21/19 1138 11/21/19 1601  GLUCAP 214* 188* 170*     GFR: Estimated Creatinine Clearance: 10.6 mL/min (A) (by C-G formula based on SCr of 10.23 mg/dL (H)).  Coagulation Profile: No results for input(s): INR, PROTIME in the last 168 hours.  Recent Results (from the past 240 hour(s))  Blood Culture (routine x 2)     Status: None   Collection Time: 11/15/19  5:28 PM   Specimen: BLOOD  Result Value Ref Range Status   Specimen Description BLOOD BLOOD RIGHT FOREARM  Final   Special Requests   Final    BOTTLES DRAWN AEROBIC AND ANAEROBIC Blood Culture adequate volume   Culture   Final    NO GROWTH 5 DAYS Performed at [facility], [address] [place], [place] [address]    Report Status 11/20/2019 FINAL  Final  Blood Culture (routine x 2)     Status: None   Collection Time: 11/15/19  5:28 PM   Specimen: BLOOD  Result Value  Ref Range Status   Specimen Description BLOOD RIGHT ANTECUBITAL  Final   Special Requests   Final    BOTTLES DRAWN AEROBIC AND ANAEROBIC Blood Culture results may not be optimal due to an inadequate volume of blood received in culture bottles   Culture   Final    NO GROWTH 5 DAYS Performed at [facility], [address], [place], [place] [address]    Report Status 11/20/2019 FINAL  Final  SARS Coronavirus 2 by RT PCR (hospital order, performed in [place] hospital lab) Nasopharyngeal Nasopharyngeal Swab     Status: None   Collection Time: 11/15/19  7:14 PM   Specimen: Nasopharyngeal Swab  Result Value Ref Range Status   SARS Coronavirus 2 NEGATIVE NEGATIVE Final    Comment:  (NOTE) SARS-CoV-2 target nucleic acids are NOT DETECTED.  The SARS-CoV-2 RNA is generally detectable in upper and lower respiratory specimens during the acute phase of infection. The lowest concentration of SARS-CoV-2 viral copies this assay can detect is 250 copies / mL. A negative result does not preclude SARS-CoV-2 infection and should not be used as the sole basis for treatment or other patient management decisions.  A negative result may occur with improper specimen collection / handling, submission of specimen other than nasopharyngeal swab, presence of viral mutation(s) within the areas targeted by this assay, and inadequate number of viral copies (<250 copies / mL). A negative result must be combined with clinical observations, patient history, and epidemiological information.  Fact Sheet for Patients:   StrictlyIdeas.no  Fact Sheet for Healthcare Providers: BankingDealers.co.za  This test is not yet approved or  cleared by the [place] FDA and has been authorized for detection and/or diagnosis of SARS-CoV-2 by FDA under an Emergency Use Authorization (EUA).  This EUA will remain in effect (meaning this test can be used) for the duration of the COVID-19 declaration under Section 564(b)(1) of the Act, 21 U.S.C. section 360bbb-3(b)(1), unless the authorization is terminated or revoked sooner.  Performed at [facility], [address] [place], [place] [address]   Expectorated sputum assessment w rflx to resp cult     Status: None   Collection Time: 11/19/19  6:00 AM   Specimen: Sputum  Result Value Ref Range Status   Specimen Description SPUTUM  Final   Special Requests NONE  Final   Sputum evaluation   Final    Sputum specimen not acceptable for testing.  Please recollect.   NOTIFIED VICTORIA BADGETT AT 0822 OF NEED FOR RECOLLECT. [facility] Performed at [facility], [address] [place], [place]  [address]    Report Status 11/20/2019 FINAL  Final  Respiratory Panel by PCR     Status: None   Collection Time: 11/19/19 12:57 PM   Specimen: Nasopharyngeal Swab; Respiratory  Result Value Ref Range Status   Adenovirus NOT DETECTED NOT DETECTED Final   Coronavirus 229E NOT DETECTED NOT DETECTED Final    Comment: (NOTE) The Coronavirus on the Respiratory Panel, DOES NOT test for the novel  Coronavirus (2019 nCoV)    Coronavirus HKU1 NOT DETECTED NOT DETECTED Final   Coronavirus NL63 NOT DETECTED NOT DETECTED Final   Coronavirus OC43 NOT DETECTED NOT DETECTED Final   Metapneumovirus NOT DETECTED NOT DETECTED Final   Rhinovirus / Enterovirus NOT DETECTED NOT DETECTED Final   Influenza A NOT DETECTED NOT DETECTED Final   Influenza B NOT DETECTED NOT DETECTED Final   Parainfluenza Virus 1 NOT DETECTED NOT DETECTED Final  Parainfluenza Virus 2 NOT DETECTED NOT DETECTED Final   Parainfluenza Virus 3 NOT DETECTED NOT DETECTED Final   Parainfluenza Virus 4 NOT DETECTED NOT DETECTED Final   Respiratory Syncytial Virus NOT DETECTED NOT DETECTED Final   Bordetella pertussis NOT DETECTED NOT DETECTED Final   Chlamydophila pneumoniae NOT DETECTED NOT DETECTED Final   Mycoplasma pneumoniae NOT DETECTED NOT DETECTED Final    Comment: Performed at [facility], [place] [address], [place], [place] [address]  MRSA PCR Screening     Status: None   Collection Time: 11/19/19 12:57 PM   Specimen: Nasopharyngeal  Result Value Ref Range Status   MRSA by PCR NEGATIVE NEGATIVE Final    Comment:        The GeneXpert MRSA Assay (FDA approved for NASAL specimens only), is one component of a comprehensive MRSA colonization surveillance program. It is not intended to diagnose MRSA infection nor to guide or monitor treatment for MRSA infections. Performed at [facility], [address] [place], [place] [address]   Expectorated sputum assessment w rflx to resp cult     Status: None    Collection Time: 11/19/19 12:58 PM   Specimen: Sputum  Result Value Ref Range Status   Specimen Description SPUTUM  Final   Special Requests SPUTUM  Final   Sputum evaluation   Final    Sputum specimen not acceptable for testing.  Please recollect.   Early [place]sm[place] NOTIFIED AT 1417 ON 11/19/19 [facility] Performed at [facility], [address] [place], [place] [address]    Report Status 11/19/2019 FINAL  Final        Radiology Studies: US Abdomen Limited RUQ  Result Date: 11/20/2019 CLINICAL DATA:  Liver lesion. EXAM: ULTRASOUND ABDOMEN LIMITED RIGHT UPPER QUADRANT COMPARISON:  Chest CT 11/19/2019. Right upper quadrant abdominal ultrasound 05/08/2016. FINDINGS: Gallbladder: Surgically absent. Common bile duct: Diameter: 4 mm Liver: Background increased parenchymal echogenicity diffusely. Approximately 7.8 x 5.3 x 7.6 cm lesion anteriorly in the liver with heterogeneous posterior shadowing and a small amount of internal vascularity. The lesion demonstrates heterogeneous echogenicity but is primarily hyperechoic. Portal vein is patent on color Doppler imaging with normal direction of blood flow towards the liver. Other: None. IMPRESSION: Indeterminate solid liver mass. When the patient is clinically stable and able to follow directions and hold their breath (preferably as an outpatient), further evaluation with a contrast-enhanced abdominal MRI is recommended. Electronically Signed   By: Logan Bores M.D.   On: 11/20/2019 11:53        Scheduled Meds: . sodium chloride   Intravenous Once  . amiodarone  200 mg Oral Daily  . amitriptyline  25 mg Oral QHS  . vitamin C  1,000 mg Oral BID  . aspirin EC  81 mg Oral Daily  . atorvastatin  40 mg Oral Daily  . calcitRIOL  0.5 mcg Oral Daily  . calcium acetate  1,334 mg Oral TID WC  . Chlorhexidine Gluconate Cloth  6 each Topical Q0600  . doxycycline  100 mg Oral Q12H  . febuxostat  80 mg Oral Daily  . fluticasone furoate-vilanterol  1 puff  Inhalation Daily  . furosemide  40 mg Oral Daily  . gentamicin cream  1 application Topical Daily  . hydrALAZINE  25 mg Oral TID  . insulin aspart  0-6 Units Subcutaneous TID WC  . insulin regular human CONCENTRATED  15 Units Subcutaneous BID WC  . ipratropium-albuterol  3 mL Inhalation Daily  . lactulose  10 g Oral Daily  . levothyroxine  100 mcg Oral Q0600  . loratadine  10 mg Oral Daily  . metoprolol tartrate  25 mg Oral BID  . pantoprazole  40 mg Oral BID  . sodium chloride flush  3 mL Intravenous Q12H   Continuous Infusions: . sodium chloride 250 mL (11/18/19 1814)  . sodium chloride    . sodium chloride    . dialysis solution 2.5% low-MG/low-CA Stopped (11/21/19 9437)     LOS: 6 days     Cordelia Poche, MD [facility] 11/21/2019, 5:35 PM  If 7PM-7AM, please contact night-coverage [email or web address]

## 2019-11-21 NOTE — Progress Notes (Signed)
Physical Therapy Treatment Patient Details Name: Patrick Hurst MRN: [id number] DOB: Oct 15, 1955 Today's Date: 11/21/2019    History of Present Illness 64 year old with prior history of diastolic heart failure, end-stage renal disease on PD, does require intermittent HD, paroxysmal atrial fibrillation, anemia of chronic disease gallop, depression, Bell's palsy, carpal tunnel syndrome upper lipidemia, hypertension, DM, COPD, OHS, OSA on BIPAP at night, Hypothyroidism, presents to ED, with SOB.    PT Comments    Participated in exercises as described below.  Stood and is able to complete lap around unit with RW and min guard.  Generally steady but does endorse general weakness in LE's.  Balance improved with walker.  Encouraged to use upon discharge for balance and energy conservation.  O2 at 3 lpm for mobility.  Does sesat to mid 80's x 2 but recovers quickly with rest and cues to deep breathe.  Will need RW for discharge.   Follow Up Recommendations  Outpatient PT     Equipment Recommendations  Rolling walker with 5" wheels    Recommendations for Other Services       Precautions / Restrictions Precautions Precaution Comments: mod fall    Mobility  Bed Mobility Overal bed mobility: Modified Independent                Transfers Overall transfer level: Needs assistance Equipment used: Rolling walker (2 wheeled) Transfers: Sit to/from Stand              Ambulation/Gait Ambulation/Gait assistance: Min guard Gait Distance (Feet): 180 Feet Assistive device: Rolling walker (2 wheeled) Gait Pattern/deviations: Step-through pattern;Decreased step length - right;Decreased step length - left Gait velocity: WFL   General Gait Details: reported general weakness but no LOB ot buckling with RW   Stairs             Wheelchair Mobility    Modified Rankin (Stroke Patients Only)       Balance Overall balance assessment: Needs assistance Sitting-balance support:  Feet supported;No upper extremity supported Sitting balance-Leahy Scale: Good     Standing balance support: Bilateral upper extremity supported Standing balance-Leahy Scale: Good                              Cognition Arousal/Alertness: Awake/alert Behavior During Therapy: WFL for tasks assessed/performed Overall Cognitive Status: Within Functional Limits for tasks assessed                                        Exercises Other Exercises Other Exercises: standing marches, SLR and heel raises x 10, seated LAQ, marches and ab/add x 10, verbal reveiw of supine HEP    General Comments        Pertinent Vitals/Pain Pain Assessment: No/denies pain    Home Living                      Prior Function            PT Goals (current goals can now be found in the care plan section) Progress towards PT goals: Progressing toward goals    Frequency    Min 2X/week      PT Plan Current plan remains appropriate    Co-evaluation              AM-PAC PT "6 Clicks" Mobility   Outcome Measure  Help needed turning from your back to your side while in a flat bed without using bedrails?: None Help needed moving from lying on your back to sitting on the side of a flat bed without using bedrails?: None Help needed moving to and from a bed to a chair (including a wheelchair)?: A Little Help needed standing up from a chair using your arms (e.g., wheelchair or bedside chair)?: A Little   Help needed climbing 3-5 steps with a railing? : A Little 6 Click Score: 17    End of Session Equipment Utilized During Treatment: Gait belt Activity Tolerance: Patient tolerated treatment well Patient left: in bed;with family/visitor present;with call bell/phone within reach Nurse Communication: Mobility status       Time: 1747-1595 PT Time Calculation (min) (ACUTE ONLY): 15 min  Charges:  $Gait Training: 8-22 mins                    Chesley Noon,  PTA 11/21/19, 9:15 AM

## 2019-11-21 NOTE — Progress Notes (Signed)
Inpatient Diabetes Program Recommendations  AACE/ADA: New Consensus Statement on Inpatient Glycemic Control   Target Ranges:  Prepandial:   less than 140 mg/dL      Peak postprandial:   less than 180 mg/dL (1-2 hours)      Critically ill patients:  140 - 180 mg/dL   Results for Patrick Hurst, Patrick Hurst (MRN [id number]) as of 11/21/2019 11:07  Ref. Range 11/20/2019 08:26 11/20/2019 11:53 11/20/2019 17:16 11/20/2019 21:19 11/21/2019 07:51  Glucose-Capillary Latest Ref Range: 70 - 99 mg/dL 250 (H) 207 (H) 171 (H) 246 (H) 214 (H)   Review of Glycemic Control  Diabetes history: DM2 Outpatient Diabetes medications: Humulin R U500 60 units QAM, 50 units QPM Current orders for Inpatient glycemic control: Humulin R U500 15 units BID, Novolog 0-6 units TID with meals  Inpatient Diabetes Program Recommendations:    Insulin-Please consider increasing Humulin R U500 to 20 units BID.  Thanks, Barnie Alderman, RN, MSN, CDE Diabetes Coordinator Inpatient Diabetes Program [phone] (Team Pager from 8am to 5pm)

## 2019-11-22 ENCOUNTER — Inpatient Hospital Stay: Payer: Commercial Managed Care - PPO

## 2019-11-22 LAB — GLUCOSE, CAPILLARY
Glucose-Capillary: 204 mg/dL — ABNORMAL HIGH (ref 70–99)
Glucose-Capillary: 205 mg/dL — ABNORMAL HIGH (ref 70–99)
Glucose-Capillary: 344 mg/dL — ABNORMAL HIGH (ref 70–99)
Glucose-Capillary: 180 mg/dL — ABNORMAL HIGH (ref 70–99)

## 2019-11-22 LAB — AFP TUMOR MARKER: AFP, Serum, Tumor Marker: <0.9 ng/mL (ref 0.0–8.3)

## 2019-11-22 NOTE — Progress Notes (Signed)
[facility]  ROUNDING NOTE   Subjective:   Peritoneal dialysis last night. Tolerated treatment well. UF 1430.  Wife at bedside.   Objective:  Vital signs in last 24 hours:  Temp:  [97.9 F (36.6 C)-98.3 F (36.8 C)] 97.9 F (36.6 C) (07/22 1222) Pulse Rate:  [61-75] 61 (07/22 1222) Resp:  [14-20] 18 (07/22 1222) BP: (122-171)/(48-106) 133/64 (07/22 1222) SpO2:  [89 %-97 %] 89 % (07/22 1222) Weight:  [136.9 kg] 136.9 kg (07/22 0500)  Weight change: -0.9 kg Filed Weights   11/15/19 1500 11/21/19 0500 11/22/19 0500  Weight: (!) 140 kg (!) 137.8 kg (!) 136.9 kg    Intake/Output: I/O last 3 completed shifts: In: 56 [P.O.:720] Out: 2348 [Urine:825; Other:1523]   Intake/Output this shift:  Total I/O In: 480 [P.O.:480] Out: -   Physical Exam: General: No acute distress  Head: Normocephalic, atraumatic. Moist oral mucosal membranes  Eyes: Anicteric  Neck: Supple, trachea midline  Lungs:  Diminished on left, 2L [place] O2  Heart: regular  Abdomen:  Soft, nontender, bowel sounds present  Extremities: no peripheral edema.  Neurologic: Awake, alert, following commands  Skin: No lesions  Access: PD catheter in place, right IJ PermCath in place, developing left upper extremity AV access    Basic Metabolic Panel: Recent Labs  Lab 11/17/19 0315 11/17/19 0315 11/17/19 2035 11/18/19 0357 11/18/19 0357 11/19/19 0843 11/20/19 0959 11/21/19 0439  NA 138  --   --  136  --  137 136 139  K 4.2  --   --  4.0  --  4.5 4.7 4.3  CL 95*  --   --  94*  --  94* 94* 96*  CO2 27  --   --  24  --  25 26 25   GLUCOSE 139*  --   --  169*  --  243* 274* 257*  BUN 77*  --   --  89*  --  91* 92* 96*  CREATININE 8.22*  --   --  9.38*  --  9.57* 10.08* 10.23*  CALCIUM 8.4*   < >  --  8.3*   < > 9.1 8.6* 9.7  PHOS  --   --  9.6*  --   --   --   --   --    < > = values in this interval not displayed.    Liver Function Tests: Recent Labs  Lab 11/16/19 0159 11/19/19 0843   AST 25 22  ALT 28 30  ALKPHOS 207* 250*  BILITOT 0.7 0.7  PROT 7.6 7.9  ALBUMIN 3.1* 3.3*   No results for input(s): LIPASE, AMYLASE in the last 168 hours. No results for input(s): AMMONIA in the last 168 hours.  CBC: Recent Labs  Lab 11/16/19 0159 11/17/19 0315 11/18/19 0357 11/19/19 0843 11/21/19 0439  WBC 7.3 7.3 7.8 6.3 7.7  NEUTROABS 5.2  --   --   --  5.6  HGB 6.6* 7.3* 7.5* 8.0* 8.1*  HCT 20.6* 21.6* 22.0* 24.0* 24.2*  MCV 95.4 90.8 89.4 91.6 91.0  PLT 137* 138* 129* 125* 124*    Cardiac Enzymes: No results for input(s): CKTOTAL, CKMB, CKMBINDEX, TROPONINI in the last 168 hours.  BNP: Invalid input(s): POCBNP  CBG: Recent Labs  Lab 11/21/19 1138 11/21/19 1601 11/21/19 2130 11/22/19 0830 11/22/19 1219  GLUCAP 188* 170* 227* 204* 205*    Microbiology: Results for orders placed or performed during the hospital encounter of 11/15/19  Blood Culture (routine x  2)     Status: None   Collection Time: 11/15/19  5:28 PM   Specimen: BLOOD  Result Value Ref Range Status   Specimen Description BLOOD BLOOD RIGHT FOREARM  Final   Special Requests   Final    BOTTLES DRAWN AEROBIC AND ANAEROBIC Blood Culture adequate volume   Culture   Final    NO GROWTH 5 DAYS Performed at [facility], [address], [place], [place] [address]    Report Status 11/20/2019 FINAL  Final  Blood Culture (routine x 2)     Status: None   Collection Time: 11/15/19  5:28 PM   Specimen: BLOOD  Result Value Ref Range Status   Specimen Description BLOOD RIGHT ANTECUBITAL  Final   Special Requests   Final    BOTTLES DRAWN AEROBIC AND ANAEROBIC Blood Culture results may not be optimal due to an inadequate volume of blood received in culture bottles   Culture   Final    NO GROWTH 5 DAYS Performed at [facility], [address] [place], [place] [address]    Report Status 11/20/2019 FINAL  Final  SARS Coronavirus 2 by RT PCR (hospital order, performed in [place] hospital lab) Nasopharyngeal Nasopharyngeal Swab     Status: None   Collection Time: 11/15/19  7:14 PM   Specimen: Nasopharyngeal Swab  Result Value Ref Range Status   SARS Coronavirus 2 NEGATIVE NEGATIVE Final    Comment: (NOTE) SARS-CoV-2 target nucleic acids are NOT DETECTED.  The SARS-CoV-2 RNA is generally detectable in upper and lower respiratory specimens during the acute phase of infection. The lowest concentration of SARS-CoV-2 viral copies this assay can detect is 250 copies / mL. A negative result does not preclude SARS-CoV-2 infection and should not be used as the sole basis for treatment or other patient management decisions.  A negative result may occur with improper specimen collection / handling, submission of specimen other than nasopharyngeal swab, presence of viral mutation(s) within the areas targeted by this assay, and inadequate number of viral copies (<250 copies / mL). A negative result must be combined with clinical observations, patient history, and epidemiological information.  Fact Sheet for Patients:   StrictlyIdeas.no  Fact Sheet for Healthcare Providers: BankingDealers.co.za  This test is not yet approved or  cleared by the [place] FDA and has been authorized for detection and/or diagnosis of SARS-CoV-2 by FDA under an Emergency Use Authorization (EUA).  This EUA will remain in effect (meaning this test can be used) for the duration of the COVID-19 declaration under Section 564(b)(1) of the Act, 21 U.S.C. section 360bbb-3(b)(1), unless the authorization is terminated or revoked sooner.  Performed at [facility], [address] [place], [place] [address]   Expectorated sputum assessment w rflx to resp cult     Status: None   Collection Time: 11/19/19  6:00 AM   Specimen: Sputum  Result Value Ref Range Status   Specimen Description SPUTUM  Final   Special Requests NONE  Final    Sputum evaluation   Final    Sputum specimen not acceptable for testing.  Please recollect.   NOTIFIED VICTORIA BADGETT AT 0822 OF NEED FOR RECOLLECT. [facility] Performed at [facility], [address] [place], [place] [address]    Report Status 11/20/2019 FINAL  Final  Respiratory Panel by PCR     Status: None   Collection Time: 11/19/19 12:57 PM   Specimen: Nasopharyngeal Swab; Respiratory  Result Value Ref Range  Status   Adenovirus NOT DETECTED NOT DETECTED Final   Coronavirus 229E NOT DETECTED NOT DETECTED Final    Comment: (NOTE) The Coronavirus on the Respiratory Panel, DOES NOT test for the novel  Coronavirus (2019 nCoV)    Coronavirus HKU1 NOT DETECTED NOT DETECTED Final   Coronavirus NL63 NOT DETECTED NOT DETECTED Final   Coronavirus OC43 NOT DETECTED NOT DETECTED Final   Metapneumovirus NOT DETECTED NOT DETECTED Final   Rhinovirus / Enterovirus NOT DETECTED NOT DETECTED Final   Influenza A NOT DETECTED NOT DETECTED Final   Influenza B NOT DETECTED NOT DETECTED Final   Parainfluenza Virus 1 NOT DETECTED NOT DETECTED Final   Parainfluenza Virus 2 NOT DETECTED NOT DETECTED Final   Parainfluenza Virus 3 NOT DETECTED NOT DETECTED Final   Parainfluenza Virus 4 NOT DETECTED NOT DETECTED Final   Respiratory Syncytial Virus NOT DETECTED NOT DETECTED Final   Bordetella pertussis NOT DETECTED NOT DETECTED Final   Chlamydophila pneumoniae NOT DETECTED NOT DETECTED Final   Mycoplasma pneumoniae NOT DETECTED NOT DETECTED Final    Comment: Performed at [facility], [place] [address], [place], [place] [address]  MRSA PCR Screening     Status: None   Collection Time: 11/19/19 12:57 PM   Specimen: Nasopharyngeal  Result Value Ref Range Status   MRSA by PCR NEGATIVE NEGATIVE Final    Comment:        The GeneXpert MRSA Assay (FDA approved for NASAL specimens only), is one component of a comprehensive MRSA colonization surveillance program. It is not intended to diagnose  MRSA infection nor to guide or monitor treatment for MRSA infections. Performed at [facility], [address] [place], [place] [address]   Expectorated sputum assessment w rflx to resp cult     Status: None   Collection Time: 11/19/19 12:58 PM   Specimen: Sputum  Result Value Ref Range Status   Specimen Description SPUTUM  Final   Special Requests SPUTUM  Final   Sputum evaluation   Final    Sputum specimen not acceptable for testing.  Please recollect.   Early Osmond NOTIFIED AT 1417 ON 11/19/19 [facility] Performed at [facility], [address] [place], [place] [address]    Report Status 11/19/2019 FINAL  Final    Coagulation Studies: No results for input(s): LABPROT, INR in the last 72 hours.  Urinalysis: No results for input(s): COLORURINE, LABSPEC, PHURINE, GLUCOSEU, HGBUR, BILIRUBINUR, KETONESUR, PROTEINUR, UROBILINOGEN, NITRITE, LEUKOCYTESUR in the last 72 hours.  Invalid input(s): APPERANCEUR    Imaging: DG Chest Port 1 View  Result Date: 11/21/2019 CLINICAL DATA:  Abnormal chest radiograph EXAM: PORTABLE CHEST 1 VIEW COMPARISON:  CT 11/19/2019, radiograph 11/18/2019 FINDINGS: Dual lumen right IJ approach dialysis catheter tip terminates at the superior cavoatrial junction in stable position from prior. Telemetry leads overlie the chest. Redemonstration of the bilateral patchy airspace opacities most pronounced in the left mid to lower lung and right lung base. Stable bandlike opacity in the right mid lung likely reflecting some subsegmental atelectasis and/or scarring as was present on comparison CT. No pneumothorax or visible effusion. Cardiomediastinal contours are stable. No acute osseous or soft tissue abnormality. Degenerative changes are present in the imaged spine and shoulders. IMPRESSION: Persistent bilateral patchy airspace opacities, perhaps mildly improved in the left mid lung but with accentuation in the bases likely related to diminished volumes.  Stable satisfactory position of a right IJ approach dual lumen central venous catheter. Electronically Signed   By: March Rummage  [facility] M.D.   On: 11/21/2019 18:35     Medications:   . sodium chloride 250 mL (11/18/19 1814)  . sodium chloride    . dialysis solution 2.5% low-MG/low-CA 0 mL (11/21/19 0635)   . sodium chloride   Intravenous Once  . amiodarone  200 mg Oral Daily  . amitriptyline  25 mg Oral QHS  . vitamin C  1,000 mg Oral BID  . aspirin EC  81 mg Oral Daily  . atorvastatin  40 mg Oral Daily  . calcitRIOL  0.5 mcg Oral Daily  . calcium acetate  1,334 mg Oral TID WC  . Chlorhexidine Gluconate Cloth  6 each Topical Q0600  . doxycycline  100 mg Oral Q12H  . febuxostat  80 mg Oral Daily  . fluticasone furoate-vilanterol  1 puff Inhalation Daily  . furosemide  40 mg Oral Daily  . gentamicin cream  1 application Topical Daily  . hydrALAZINE  25 mg Oral TID  . insulin aspart  0-6 Units Subcutaneous TID WC  . insulin regular human CONCENTRATED  15 Units Subcutaneous BID WC  . ipratropium-albuterol  3 mL Inhalation Daily  . lactulose  10 g Oral Daily  . levothyroxine  100 mcg Oral Q0600  . loratadine  10 mg Oral Daily  . metoprolol tartrate  25 mg Oral BID  . pantoprazole  40 mg Oral BID  . sodium chloride flush  3 mL Intravenous Q12H   sodium chloride, sodium chloride, acetaminophen **OR** acetaminophen, albuterol, alteplase, alum & mag hydroxide-simeth, heparin, HYDROcodone-acetaminophen, lidocaine (PF), lidocaine-prilocaine, morphine injection, ondansetron (ZOFRAN) IV, pentafluoroprop-tetrafluoroeth, sodium chloride flush, traZODone  Assessment/ Plan:   [name] is a 64 y.o. white male with end stage renal disease on peritoneal dialysis, chronic diastolic heart failure, Bell's palsy, carpal tunnel syndrome, depression, GERD, gout, who is admitted to [facility] on 11/15/2019 for CAP (community acquired pneumonia) [J18.9] Acute respiratory failure with hypoxia ([place])  [J96.01] Anemia of chronic disease [D63.8] Community acquired pneumonia, unspecified laterality [J18.9]  [facility] 139kg CCPD 9 hours 5 exchanges  1. End stage renal disease: on peritoneal dialysis. Back up hemodialysis treatment 7/16. Azotemia noted. Not currently on systemic steroids.  Did not make adequacy this month and his prescription has been changed to 5 exchanges.  - peritoneal dialysis for tonight.   2.  Anemia of chronic kidney disease status post PRBC transfusion on 7/16. No ESA on this admission. He is scheduled for outpatient EPO and venofer as outpatient on 7/21 however will hold until liver lesion work up complete.   3. Secondary hyperparathyroidism with hyperphosphatemia:   - calcitriol  - started calcium acetate this admission   4. Hypertension: well controlled. Currently on furosemide, hydralazine and metoprolol.  Home regimen includes amlodipine and torsemide which are currently being held.   5. Liver mass:  - MRI ordered.  - Appreciate GI input.    LOS: 7 Charnell Peplinski 7/22/20212:17 PM

## 2019-11-22 NOTE — Progress Notes (Signed)
Pulmonary Medicine          Date: 11/22/2019,   MRN# [id number] Patrick Hurst 1955/07/21     AdmissionWeight: (!) 140 kg                 CurrentWeight: (!) 136.9 kg   Referring physician: Dr Juleen China   CHIEF COMPLAINT:   Acute on Chronic hypoxemic respiratory failure   SUBJECTIVE   Patient is feeling better , O2 is weaned to 2.0L.  Repeat CXR reviewed by me with improvent in infiltrates   PAST MEDICAL HISTORY   Past Medical History:  Diagnosis Date  . Acute on chronic diastolic CHF (congestive heart failure) ([place]) 06/21/2018  . Acute respiratory failure with hypoxia ([place]) 06/21/2018  . Bell palsy 02/12/2015  . Carpal tunnel syndrome 02/12/2015  . Dependence on nocturnal oxygen therapy    2 LITERS WITH BIPAP  . Depression   . Gastric ulcer   . GERD (gastroesophageal reflux disease)   . Gout   . History of chicken pox   . Multifocal pneumonia 03/04/2019  . Sleep apnea treated with nocturnal BiPAP      SURGICAL HISTORY   Past Surgical History:  Procedure Laterality Date  . AV FISTULA PLACEMENT Left 11/08/2019   Procedure: ARTERIOVENOUS (AV) FISTULA CREATION (RADIALCEPHALIC);  Surgeon: Algernon Huxley, MD;  Location: [facility] ORS;  Service: Vascular;  Laterality: Left;  . CATARACT EXTRACTION Bilateral 2014 and 2015  . CHOLECYSTECTOMY  04/28/2011   Laproscopic; Dr. Pat Patrick  . DIALYSIS/PERMA CATHETER INSERTION N/A 05/24/2019   Procedure: DIALYSIS/PERMA CATHETER INSERTION;  Surgeon: Algernon Huxley, MD;  Location: [place] [facility];  Service: Cardiovascular;  Laterality: N/A;  . DIALYSIS/PERMA CATHETER INSERTION N/A 10/09/2019   Procedure: DIALYSIS/PERMA CATHETER INSERTION;  Surgeon: Katha Cabal, MD;  Location: [place] [facility];  Service: Cardiovascular;  Laterality: N/A;  . EYE SURGERY    . GALLBLADDER SURGERY    . LASIK Bilateral 2019   medical  . SHOULDER SURGERY Right 2012   Dr. Leanor Kail     FAMILY HISTORY   Family History  Problem Relation  Age of Onset  . COPD Mother   . Cancer Mother   . Heart disease Father      SOCIAL HISTORY   Social History   Tobacco Use  . Smoking status: Former Smoker    Packs/day: 1.00    Years: 15.00    Pack years: 15.00    Types: Cigarettes    Quit date: 05/03/1978    Years since quitting: 41.5  . Smokeless tobacco: Never Used  . Tobacco comment: started smoking at age 66  Vaping Use  . Vaping Use: Never used  Substance Use Topics  . Alcohol use: No    Alcohol/week: 0.0 standard drinks  . Drug use: No     MEDICATIONS    Home Medication:    Current Medication:  Current Facility-Administered Medications:  .  0.9 %  sodium chloride infusion (Manually program via Guardrails IV Fluids), , Intravenous, Once, Patel, Ekta V, MD .  0.9 %  sodium chloride infusion, 250 mL, Intravenous, PRN, Para Skeans, MD, Last Rate: 10 mL/hr at 11/18/19 1814, 250 mL at 11/18/19 1814 .  0.9 %  sodium chloride infusion, 100 mL, Intravenous, PRN, Lateef, Munsoor, MD .  acetaminophen (TYLENOL) tablet 650 mg, 650 mg, Oral, Q6H PRN **OR** acetaminophen (TYLENOL) suppository 650 mg, 650 mg, Rectal, Q6H PRN, Para Skeans, MD .  albuterol (PROVENTIL) (2.5  MG/3ML) 0.083% nebulizer solution 2.5 mg, 2.5 mg, Nebulization, Q2H PRN, Para Skeans, MD, 2.5 mg at 11/19/19 0221 .  alteplase (CATHFLO ACTIVASE) injection 2 mg, 2 mg, Intracatheter, Once PRN, Lateef, Munsoor, MD .  alum & mag hydroxide-simeth (MAALOX/MYLANTA) [phone] MG/5ML suspension 15 mL, 15 mL, Oral, Q4H PRN, Hosie Poisson, MD, 15 mL at 11/22/19 1244 .  amiodarone (PACERONE) tablet 200 mg, 200 mg, Oral, Daily, Florina Ou V, MD, 200 mg at 11/22/19 0909 .  amitriptyline (ELAVIL) tablet 25 mg, 25 mg, Oral, QHS, Florina Ou V, MD, 25 mg at 11/21/19 2115 .  ascorbic acid (VITAMIN C) tablet 1,000 mg, 1,000 mg, Oral, BID, Para Skeans, MD, 1,000 mg at 11/22/19 0909 .  aspirin EC tablet 81 mg, 81 mg, Oral, Daily, Florina Ou V, MD, 81 mg at 11/22/19  0910 .  atorvastatin (LIPITOR) tablet 40 mg, 40 mg, Oral, Daily, Para Skeans, MD, 40 mg at 11/22/19 0909 .  calcitRIOL (ROCALTROL) capsule 0.5 mcg, 0.5 mcg, Oral, Daily, Florina Ou V, MD, 0.5 mcg at 11/22/19 0909 .  calcium acetate (PHOSLO) capsule 1,334 mg, 1,334 mg, Oral, TID WC, Kolluru, Sarath, MD, 1,334 mg at 11/22/19 1750 .  Chlorhexidine Gluconate Cloth 2 % PADS 6 each, 6 each, Topical, Q0600, Holley Raring, Munsoor, MD, 6 each at 11/22/19 [phone] .  dialysis solution 2.5% low-MG/low-CA dianeal solution, , Intraperitoneal, Q24H, Kolluru, Sarath, MD, Last Rate: 0 mL/hr at 11/21/19 0635, New Bag at 11/21/19 1830 .  doxycycline (VIBRA-TABS) tablet 100 mg, 100 mg, Oral, Q12H, Hosie Poisson, MD, 100 mg at 11/22/19 0910 .  febuxostat (ULORIC) tablet 80 mg, 80 mg, Oral, Daily, Para Skeans, MD, 80 mg at 11/22/19 0909 .  fluticasone furoate-vilanterol (BREO ELLIPTA) 100-25 MCG/INH 1 puff, 1 puff, Inhalation, Daily, Florina Ou V, MD, 1 puff at 11/22/19 0911 .  furosemide (LASIX) tablet 40 mg, 40 mg, Oral, Daily, Kolluru, Sarath, MD, 40 mg at 11/22/19 0910 .  gentamicin cream (GARAMYCIN) 0.1 % 1 application, 1 application, Topical, Daily, Kolluru, Sarath, MD, 1 application at 18/84/16 0912 .  heparin injection 1,000 Units, 1,000 Units, Dialysis, PRN, Lateef, Munsoor, MD .  hydrALAZINE (APRESOLINE) tablet 25 mg, 25 mg, Oral, TID, Mariel Aloe, MD, 25 mg at 11/22/19 1751 .  HYDROcodone-acetaminophen (NORCO/VICODIN) 5-325 MG per tablet 1-2 tablet, 1-2 tablet, Oral, Q4H PRN, Florina Ou V, MD .  insulin aspart (novoLOG) injection 0-6 Units, 0-6 Units, Subcutaneous, TID WC, Benita Gutter, RPH, 4 Units at 11/22/19 1750 .  insulin regular human CONCENTRATED (HUMULIN R) 500 UNIT/ML kwikpen 15 Units, 15 Units, Subcutaneous, BID WC, Hosie Poisson, MD, 15 Units at 11/22/19 1750 .  ipratropium-albuterol (DUONEB) 0.5-2.5 (3) MG/3ML nebulizer solution 3 mL, 3 mL, Inhalation, Daily, Para Skeans, MD, 3 mL at  11/22/19 0922 .  lactulose (CHRONULAC) 10 GM/15ML solution 10 g, 10 g, Oral, Daily, Florina Ou V, MD, 10 g at 11/22/19 0908 .  levothyroxine (SYNTHROID) tablet 100 mcg, 100 mcg, Oral, Q0600, Para Skeans, MD, 100 mcg at 11/22/19 [phone] .  lidocaine (PF) (XYLOCAINE) 1 % injection 5 mL, 5 mL, Intradermal, PRN, Lateef, Munsoor, MD .  lidocaine-prilocaine (EMLA) cream 1 application, 1 application, Topical, PRN, Lateef, Munsoor, MD .  loratadine (CLARITIN) tablet 10 mg, 10 mg, Oral, Daily, Florina Ou V, MD, 10 mg at 11/22/19 0910 .  metoprolol tartrate (LOPRESSOR) tablet 25 mg, 25 mg, Oral, BID, Para Skeans, MD, 25 mg at 11/22/19 0910 .  morphine 2 MG/ML injection  2 mg, 2 mg, Intravenous, Q2H PRN, Para Skeans, MD .  ondansetron [facility]) injection 4 mg, 4 mg, Intravenous, Q6H PRN, Sharion Settler, NP, 4 mg at 11/19/19 0321 .  pantoprazole (PROTONIX) EC tablet 40 mg, 40 mg, Oral, BID, Hosie Poisson, MD, 40 mg at 11/22/19 0909 .  pentafluoroprop-tetrafluoroeth (GEBAUERS) aerosol 1 application, 1 application, Topical, PRN, Lateef, Munsoor, MD .  sodium chloride flush (NS) 0.9 % injection 3 mL, 3 mL, Intravenous, Q12H, Para Skeans, MD, 3 mL at 11/22/19 0912 .  sodium chloride flush (NS) 0.9 % injection 3 mL, 3 mL, Intravenous, PRN, Para Skeans, MD .  traZODone (DESYREL) tablet 50 mg, 50 mg, Oral, QHS PRN, Sharion Settler, NP, 50 mg at 11/17/19 2210    ALLERGIES   Mucinex [guaifenesin er] and Levaquin [levofloxacin]     REVIEW OF SYSTEMS    Review of Systems:  Gen:  Denies  fever, sweats, chills weigh loss  HEENT: Denies blurred vision, double vision, ear pain, eye pain, hearing loss, nose bleeds, sore throat Cardiac:  No dizziness, chest pain or heaviness, chest tightness,edema Resp:   Denies cough or sputum porduction, shortness of breath,wheezing, hemoptysis,  Gi: Denies swallowing difficulty, stomach pain, nausea or vomiting, diarrhea, constipation, bowel incontinence Gu:   Denies bladder incontinence, burning urine Ext:   Denies Joint pain, stiffness or swelling Skin: Denies  skin rash, easy bruising or bleeding or hives Endoc:  Denies polyuria, polydipsia , polyphagia or weight change Psych:   Denies depression, insomnia or hallucinations   Other:  All other systems negative   VS: BP (!) 177/102 (BP Location: Right Arm)   Pulse 67   Temp 97.7 F (36.5 C) (Oral)   Resp 16   Ht 5\' 11"  (1.803 m)   Wt (!) 136.9 kg   SpO2 94%   BMI 42.09 kg/m      PHYSICAL EXAM    GENERAL:NAD, no fevers, chills, no weakness no fatigue HEAD: Normocephalic, atraumatic.  EYES: Pupils equal, round, reactive to light. Extraocular muscles intact. No scleral icterus.  MOUTH: Moist mucosal membrane. Dentition intact. No abscess noted.  EAR, NOSE, THROAT: Clear without exudates. No external lesions.  NECK: Supple. No thyromegaly. No nodules. No JVD.  PULMONARY: Diffuse coarse rhonchi right sided +wheezes CARDIOVASCULAR: S1 and S2. Regular rate and rhythm. No murmurs, rubs, or gallops. No edema. Pedal pulses 2+ bilaterally.  GASTROINTESTINAL: Soft, nontender, nondistended. No masses. Positive bowel sounds. No hepatosplenomegaly.  MUSCULOSKELETAL: No swelling, clubbing, or edema. Range of motion full in all extremities.  NEUROLOGIC: Cranial nerves II through XII are intact. No gross focal neurological deficits. Sensation intact. Reflexes intact.  SKIN: No ulceration, lesions, rashes, or cyanosis. Skin warm and dry. Turgor intact.  PSYCHIATRIC: Mood, affect within normal limits. The patient is awake, alert and oriented x 3. Insight, judgment intact.       IMAGING    DG Chest 2 View  Result Date: 11/18/2019 CLINICAL DATA:  Acute respiratory failure with hypoxia. EXAM: CHEST - 2 VIEW COMPARISON:  November 15, 2019. FINDINGS: The heart size and mediastinal contours are within normal limits. No pneumothorax or pleural effusion is noted. Right internal jugular dialysis catheter  is unchanged in position. Bilateral perihilar opacities are noted, left greater than right, consistent with pneumonia. The visualized skeletal structures are unremarkable. IMPRESSION: Grossly stable bilateral perihilar opacities, left greater than right, consistent with pneumonia. Electronically Signed   By: Marijo Conception M.D.   On: 11/18/2019 16:11   DG Chest  2 View  Result Date: 11/15/2019 CLINICAL DATA:  Short of breath, pneumonia EXAM: CHEST - 2 VIEW COMPARISON:  11/12/2019 FINDINGS: Frontal and lateral views of the chest demonstrate persistent bilateral perihilar airspace disease, slightly improved on the right since prior study but with significant progression on the left. There is no effusion or pneumothorax. Cardiac silhouette is enlarged but stable. Stable right internal jugular dialysis catheter. No acute bony abnormalities. IMPRESSION: 1. Bilateral perihilar airspace disease, left greater than right. There has been progression in the left since prior study. Differential includes bilateral pneumonia versus asymmetric edema. Electronically Signed   By: Randa Ngo M.D.   On: 11/15/2019 15:37   DG Chest 2 View  Result Date: 11/12/2019 CLINICAL DATA:  Shortness of breath for 4-5 days. EXAM: CHEST - 2 VIEW COMPARISON:  Single-view of the chest 03/07/2019. FINDINGS: Patchy airspace disease in the mid and lower lung zones bilaterally is worse on the left. Heart size is normal. No pneumothorax or pleural effusion. Right IJ approach dialysis catheter noted. IMPRESSION: Patchy bilateral airspace disease consistent with pneumonia. Electronically Signed   By: Inge Rise M.D.   On: 11/12/2019 15:34   CT CHEST WO CONTRAST  Result Date: 11/19/2019 CLINICAL DATA:  Dyspnea, chronic, diastolic heart failure, end-stage renal disease EXAM: CT CHEST WITHOUT CONTRAST TECHNIQUE: Multidetector CT imaging of the chest was performed following the standard protocol without IV contrast. COMPARISON:  06/19/2019  FINDINGS: Cardiovascular: Extensive coronary artery calcification is noted, predominantly within the left main and left anterior descending coronary artery. Cardiac size is at the upper limits of normal. No pericardial effusion. Central pulmonary arteries are enlarged in keeping with changes of pulmonary arterial hypertension. The thoracic aorta is of normal caliber. Right internal jugular central venous catheter tip is noted within the right atrium. Mediastinum/Nodes: No pathologic thoracic adenopathy. Lungs/Pleura: Since the prior examination, there has developed extensive bilateral asymmetric airspace infiltrates, more severe within the superior segment of the left lower lobe, likely infectious or inflammatory in the acute setting. No superimposed significant pulmonary edema. No pneumothorax or pleural effusion. The central airways are widely patent. Upper Abdomen: Cholecystectomy has been performed. Since the prior examination, there has developed a multilobulated low-attenuation lesion within segment 4 B of the liver which is indeterminate. This may represent a developing hepatic mass or complex fluid collection such as a hepatic abscess. This measures roughly 4.1 x 6.9 cm on axial image # 134/2. A trace amount of perihepatic ascites has developed. Musculoskeletal: No lytic or blastic bone lesion is identified. IMPRESSION: Interval development of multifocal pulmonary infiltrates, most in keeping with atypical infection in the acute setting. Interval development of a multilobulated indeterminate lesion within the right hepatic lobe. This would be better assessed, at least initially, with dedicated hepatics sonography or contrast enhanced CT examination. Coronary artery calcification Aortic Atherosclerosis (ICD10-I70.0). Electronically Signed   By: Fidela Salisbury MD   On: 11/19/2019 19:36   DG Chest Port 1 View  Result Date: 11/21/2019 CLINICAL DATA:  Abnormal chest radiograph EXAM: PORTABLE CHEST 1 VIEW  COMPARISON:  CT 11/19/2019, radiograph 11/18/2019 FINDINGS: Dual lumen right IJ approach dialysis catheter tip terminates at the superior cavoatrial junction in stable position from prior. Telemetry leads overlie the chest. Redemonstration of the bilateral patchy airspace opacities most pronounced in the left mid to lower lung and right lung base. Stable bandlike opacity in the right mid lung likely reflecting some subsegmental atelectasis and/or scarring as was present on comparison CT. No pneumothorax or visible effusion. Cardiomediastinal  contours are stable. No acute osseous or soft tissue abnormality. Degenerative changes are present in the imaged spine and shoulders. IMPRESSION: Persistent bilateral patchy airspace opacities, perhaps mildly improved in the left mid lung but with accentuation in the bases likely related to diminished volumes. Stable satisfactory position of a right IJ approach dual lumen central venous catheter. Electronically Signed   By: Lovena Le M.D.   On: 11/21/2019 18:35   VAS [place] UPPER EXTREMITY ARTERIAL DUPLEX  Result Date: 11/09/2019 UPPER EXTREMITY DUPLEX STUDY Performing Technologist: Concha Norway RVT  Examination Guidelines: A complete evaluation includes B-mode imaging, spectral Doppler, color Doppler, and power Doppler as needed of all accessible portions of each vessel. Bilateral testing is considered an integral part of a complete examination. Limited examinations for reoccurring indications may be performed as noted.  Right Pre-Dialysis Findings: +-----------------------+----------+--------------------+---------+--------+ Location               PSV (cm/s)Intralum. Diam. (cm)Waveform Comments +-----------------------+----------+--------------------+---------+--------+ Brachial Antecub. fossa120       0.61                triphasic         +-----------------------+----------+--------------------+---------+--------+ Radial Art at Wrist    92        0.25                 triphasic         +-----------------------+----------+--------------------+---------+--------+ Ulnar Art at Wrist     119       0.20                triphasic         +-----------------------+----------+--------------------+---------+--------+ Left Pre-Dialysis Findings: +-----------------------+----------+--------------------+---------+--------+ Location               PSV (cm/s)Intralum. Diam. (cm)Waveform Comments +-----------------------+----------+--------------------+---------+--------+ Brachial Antecub. fossa130       0.66                triphasic         +-----------------------+----------+--------------------+---------+--------+ Radial Art at Wrist    94        0.33                triphasic         +-----------------------+----------+--------------------+---------+--------+ Ulnar Art at Wrist     131       0.22                triphasic         +-----------------------+----------+--------------------+---------+--------+  Summary:  Right: No obstruction visualized in the right upper extremity. Left: No obstruction visualized in the left upper extremity. *See table(s) above for measurements and observations. Electronically signed by Leotis Pain MD on 11/09/2019 at 9:02:37 AM.    Final    ECHOCARDIOGRAM COMPLETE  Result Date: 11/16/2019    ECHOCARDIOGRAM REPORT   Patient Name:   SHARIF RENDELL Date of Exam: 11/16/2019 Medical Rec #:  [id number]      Height:       71.0 in Accession #:    [id number]     Weight:       308.6 lb Date of Birth:  1955/09/07      BSA:          2.536 m Patient Age:    64 years       BP:           128/55 mmHg Patient Gender: M              HR:  74 bpm. Exam Location:  [facility] Procedure: 2D Echo, Cardiac Doppler and Color Doppler Indications:     Dyspnea 786.09  History:         Patient has prior history of Echocardiogram examinations, most                  recent 03/06/2019. CHF; Risk Factors:Sleep Apnea.  Sonographer:     Sherrie Sport RDCS (AE)  Referring Phys:  Theola Sequin Diagnosing Phys: Ida Rogue MD IMPRESSIONS  1. Left ventricular ejection fraction, by estimation, is 60 to 65%. The left ventricle has normal function. The left ventricle has no regional wall motion abnormalities. There is mild left ventricular hypertrophy. Left ventricular diastolic parameters are consistent with Grade II diastolic dysfunction (pseudonormalization).  2. Right ventricular systolic function is normal. The right ventricular size is normal. There is normal pulmonary artery systolic pressure. The estimated right ventricular systolic pressure is 16.1 mmHg.  3. The aortic valve is moderately calcified. Mild to moderate aortic valve sclerosis/calcification is present, without any evidence of aortic stenosis.  4. Left atrial size was mildly dilated. FINDINGS  Left Ventricle: Left ventricular ejection fraction, by estimation, is 60 to 65%. The left ventricle has normal function. The left ventricle has no regional wall motion abnormalities. The left ventricular internal cavity size was normal in size. There is  mild left ventricular hypertrophy. Left ventricular diastolic parameters are consistent with Grade II diastolic dysfunction (pseudonormalization). Right Ventricle: The right ventricular size is normal. No increase in right ventricular wall thickness. Right ventricular systolic function is normal. There is normal pulmonary artery systolic pressure. The tricuspid regurgitant velocity is 1.97 m/s, and  with an assumed right atrial pressure of 10 mmHg, the estimated right ventricular systolic pressure is 09.6 mmHg. Left Atrium: Left atrial size was mildly dilated. Right Atrium: Right atrial size was normal in size. Pericardium: There is no evidence of pericardial effusion. Mitral Valve: The mitral valve is normal in structure. Normal mobility of the mitral valve leaflets. Mild mitral annular calcification. Mild mitral valve regurgitation. No evidence of mitral valve  stenosis. Tricuspid Valve: The tricuspid valve is normal in structure. Tricuspid valve regurgitation is not demonstrated. No evidence of tricuspid stenosis. Aortic Valve: The aortic valve is normal in structure. Aortic valve regurgitation is not visualized. Mild to moderate aortic valve sclerosis/calcification is present, without any evidence of aortic stenosis. Aortic valve mean gradient measures 8.5 mmHg. Aortic valve peak gradient measures 16.6 mmHg. Aortic valve area, by VTI measures 2.29 cm. Pulmonic Valve: The pulmonic valve was normal in structure. Pulmonic valve regurgitation is not visualized. No evidence of pulmonic stenosis. Aorta: The aortic root is normal in size and structure. Venous: The inferior vena cava is normal in size with greater than 50% respiratory variability, suggesting right atrial pressure of 3 mmHg. IAS/Shunts: No atrial level shunt detected by color flow Doppler.  LEFT VENTRICLE PLAX 2D LVIDd:         6.18 cm  Diastology LVIDs:         3.40 cm  LV e' lateral:   14.70 cm/s LV PW:         1.54 cm  LV E/e' lateral: 7.7 LV IVS:        0.93 cm  LV e' medial:    5.44 cm/s LVOT diam:     2.30 cm  LV E/e' medial:  20.8 LV SV:         97 LV SV Index:   38 LVOT Area:  4.15 cm  RIGHT VENTRICLE RV Basal diam:  3.25 cm RV S prime:     18.40 cm/s TAPSE (M-mode): 3.7 cm LEFT ATRIUM            Index       RIGHT ATRIUM           Index LA diam:      5.20 cm  2.05 cm/m  RA Area:     25.00 cm LA Vol (A2C): 187.0 ml 73.75 ml/m RA Volume:   79.50 ml  31.35 ml/m LA Vol (A4C): 112.0 ml 44.17 ml/m  AORTIC VALVE                    PULMONIC VALVE AV Area (Vmax):    2.10 cm     PV Vmax:        1.01 m/s AV Area (Vmean):   2.07 cm     PV Peak grad:   4.1 mmHg AV Area (VTI):     2.29 cm     RVOT Peak grad: 8 mmHg AV Vmax:           203.50 cm/s AV Vmean:          137.500 cm/s AV VTI:            0.422 m AV Peak Grad:      16.6 mmHg AV Mean Grad:      8.5 mmHg LVOT Vmax:         103.00 cm/s LVOT Vmean:         68.500 cm/s LVOT VTI:          0.233 m LVOT/AV VTI ratio: 0.55  AORTA Ao Root diam: 3.70 cm MITRAL VALVE                TRICUSPID VALVE MV Area (PHT): 3.20 cm     TR Peak grad:   15.5 mmHg MV Decel Time: 237 msec     TR Vmax:        197.00 cm/s MV E velocity: 113.00 cm/s MV A velocity: 100.00 cm/s  SHUNTS MV E/A ratio:  1.13         Systemic VTI:  0.23 m                             Systemic Diam: 2.30 cm Ida Rogue MD Electronically signed by Ida Rogue MD Signature Date/Time: 11/16/2019/2:31:17 PM    Final    VAS [place] UPPER EXT VEIN MAPPING (PRE-OP AVF)  Result Date: 11/09/2019 UPPER EXTREMITY VEIN MAPPING  Indications: Pre-access. History: Esrd.  Performing Technologist: Concha Norway RVT  Examination Guidelines: A complete evaluation includes B-mode imaging, spectral Doppler, color Doppler, and power Doppler as needed of all accessible portions of each vessel. Bilateral testing is considered an integral part of a complete examination. Limited examinations for reoccurring indications may be performed as noted. +-----------------+-------------+----------+---------+ Right Cephalic   Diameter (cm)Depth (cm)Findings  +-----------------+-------------+----------+---------+ Prox upper arm       0.54                         +-----------------+-------------+----------+---------+ Mid upper arm        0.56               branching +-----------------+-------------+----------+---------+ Dist upper arm       0.52                         +-----------------+-------------+----------+---------+  Antecubital fossa    0.56                         +-----------------+-------------+----------+---------+ Prox forearm         0.82                         +-----------------+-------------+----------+---------+ Mid forearm          0.47               branching +-----------------+-------------+----------+---------+ Dist forearm         0.38                          +-----------------+-------------+----------+---------+ +-----------------+-------------+----------+--------+ Right Basilic    Diameter (cm)Depth (cm)Findings +-----------------+-------------+----------+--------+ Prox upper arm       0.72                        +-----------------+-------------+----------+--------+ Mid upper arm        0.45                        +-----------------+-------------+----------+--------+ Dist upper arm       0.45                        +-----------------+-------------+----------+--------+ Antecubital fossa    0.44                        +-----------------+-------------+----------+--------+ Prox forearm         0.43                        +-----------------+-------------+----------+--------+ +-----------------+-------------+----------+--------+ Left Cephalic    Diameter (cm)Depth (cm)Findings +-----------------+-------------+----------+--------+ Prox upper arm       0.60                        +-----------------+-------------+----------+--------+ Mid upper arm        0.50                        +-----------------+-------------+----------+--------+ Dist upper arm       0.48                        +-----------------+-------------+----------+--------+ Antecubital fossa    0.73                        +-----------------+-------------+----------+--------+ Prox forearm         0.44                        +-----------------+-------------+----------+--------+ Mid forearm          0.49                        +-----------------+-------------+----------+--------+ Dist forearm         0.41                        +-----------------+-------------+----------+--------+ +-----------------+-------------+----------+--------+ Left Basilic     Diameter (cm)Depth (cm)Findings +-----------------+-------------+----------+--------+ Prox upper arm       0.64                        +-----------------+-------------+----------+--------+  Mid  upper arm        0.57                        +-----------------+-------------+----------+--------+ Dist upper arm       0.67                        +-----------------+-------------+----------+--------+ Antecubital fossa    0.62                        +-----------------+-------------+----------+--------+ Prox forearm         0.43                        +-----------------+-------------+----------+--------+ Summary: Right: Normal caliber and compressible veins throughout. Left: Normal caliber and compressible veins throughout. *See table(s) above for measurements and observations.  Diagnosing physician: Leotis Pain MD Electronically signed by Leotis Pain MD on 11/09/2019 at 9:02:34 AM.    Final    US Abdomen Limited RUQ  Result Date: 11/20/2019 CLINICAL DATA:  Liver lesion. EXAM: ULTRASOUND ABDOMEN LIMITED RIGHT UPPER QUADRANT COMPARISON:  Chest CT 11/19/2019. Right upper quadrant abdominal ultrasound 05/08/2016. FINDINGS: Gallbladder: Surgically absent. Common bile duct: Diameter: 4 mm Liver: Background increased parenchymal echogenicity diffusely. Approximately 7.8 x 5.3 x 7.6 cm lesion anteriorly in the liver with heterogeneous posterior shadowing and a small amount of internal vascularity. The lesion demonstrates heterogeneous echogenicity but is primarily hyperechoic. Portal vein is patent on color Doppler imaging with normal direction of blood flow towards the liver. Other: None. IMPRESSION: Indeterminate solid liver mass. When the patient is clinically stable and able to follow directions and hold their breath (preferably as an outpatient), further evaluation with a contrast-enhanced abdominal MRI is recommended. Electronically Signed   By: Logan Bores M.D.   On: 11/20/2019 11:53           ASSESSMENT/PLAN   Acute on chronic hypoxemic respiratory failure -due to CAP present on admission - possibly due to bacteria such as strep pneumo vs viral etiology - will place procal trending,  RVP negative, nasal MRSA pcr-negative -repeat respiratory cultures-not acceptable specimen -COVID negative x 6 over past 1 year - cardio/renal/hospitalist service on case - appreciate collaboration  - Rocephin/doxy -CT chest - bilateral pna worse on left lung as above  -repeat CXR with improvement   Acute blood loss anemia-resolved  - hb<7  - s/p transfusion   - monitoring h/h  - contributing to hypoxemia  - liver mass on [place] abd  - monitor for hemoptysis   COPD -agree with current care path  - will add MetaNEB therapy for aggressive BPH   OSA - continue home CPAP with O2 bleed in    Thank you for allowing me to participate in the care of this patient.    Patient/Family are satisfied with care plan and all questions have been answered.   This document was prepared using Dragon voice recognition software and may include unintentional dictation errors.     Ottie Glazier, M.D.  Division of [place]

## 2019-11-22 NOTE — Progress Notes (Signed)
PROGRESS NOTE    Patrick Hurst  ZTI:[id number] DOB: 01-08-1956 DOA: 11/15/2019 PCP: [name], MD   Brief Narrative: Patrick Hurst is a 64 y.o. male with a history of diastolic heart failure, end-stage renal disease on PD, does require intermittent HD, paroxysmal atrial fibrillation, anemia of chronic disease, depression, Bell's palsy, carpal tunnel syndrome, Hyperlipidemia, hypertension, DM, COPD, OHS, OSA on BIPAP at night, Hypothyroidism. Patient presented secondary to dyspnea and found to have pneumonia and fluid overload from heart failure/ESRD. Patient managed with antibiotics, HD/PD and lasix with slow improvement in symptoms.   Assessment & Plan:   Principal Problem:   Respiratory failure with hypoxia (HCC) Active Problems:   Diabetes mellitus with neuropathy (HCC)   Hypertension   Hypothyroidism   Anemia, iron deficiency   Chronic diastolic CHF (congestive heart failure) (HCC)   COPD (chronic obstructive pulmonary disease) (HCC)   ESRD on dialysis ([place])   CAP (community acquired pneumonia)   Community acquired pneumonia Multifocal pneumonia RVP and COVID-19 negative. MRSA pcr negative. Pulmonology consulted for management. Patient managed on Ceftriaxone and doxycycline and is improving clinically. -Continue Ceftriaxone/Doxycycline  Acute respiratory failure with hypoxia Secondary to above with concern for concomitant fluid overload from ESRD requiring PD and HD. Improving. On room air today but in low 90s at rest. -Continue to wean oxygen -Ambulate with pulse oximetry tomorrow  Right hepatic lobe lesion Unsure of etiology. GI consulted. Concern for [place] but AFP undetectable. 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 Negative for hepatitis B and C. -GI recommendations: outpatient EGD, low sodium diet  Diabetes mellitus, type 2 Hemoglobin A1C of 5.8% and well controlled. History of  diabetic retinopathy and peripheral edema. -Continue SSI  Hypothyroidism -Continue Synthroid  Acute on chronic diastolic heart failure Management with Lasix, HD an dPD. Transthoracic Echocardiogram significant for preserved EF and grade 2 diastolic dysfunction. Fluid status appears improved.  Anemia of chronic disease Patient required 1 unit of PRBC for a hemoglobin of 6.6 and is now stable  ESRD on PD/intermittent HD Patient underwent AV fistula creation by vascular surgery on 7/8. Site appears non-infected.  -Nephrology recommendations: PD nightly  Right leg mass Unsure of etiology. Does not look significantly concerning for infectious etiology. Possibly reactive -Ultrasound of right leg  Paroxysmal atrial fibrillation -Continue amiodarone and metoprolol  Acquired thrombophilia Previously on Eliquis for stroke prophylaxis which is held secondary to concern for GI bleed  COPD Stable.  Obesity Body mass index is 42.09 kg/m.   DVT prophylaxis: SCDs Code Status:   Code Status: Full Code Family Communication: Wife at bedside Disposition Plan: Discharge possible tomorrow pending MRI results and ability to wean off oxygen   Consultants:   Nephrology  Pulmonology  GI  Procedures:   TRANSTHORACIC ECHOCARDIOGRAM (11/16/2019) IMPRESSIONS    1. Left ventricular ejection fraction, by estimation, is 60 to 65%. The  left ventricle has normal function. The left ventricle has no regional  wall motion abnormalities. There is mild left ventricular hypertrophy.  Left ventricular diastolic parameters  are consistent with Grade II diastolic dysfunction (pseudonormalization).  2. Right ventricular systolic function is normal. The right ventricular  size is normal. There is normal pulmonary artery systolic pressure. The  estimated right ventricular systolic pressure is 82.5 mmHg.  3. The aortic valve is moderately calcified. Mild to moderate aortic  valve  sclerosis/calcification is present, without any evidence of aortic  stenosis.  4. Left atrial size  was mildly dilated.   Antimicrobials:  Ceftriaxone  Azithromycin  Doxycycline    Subjective: Cough improved. Dyspnea improved. Has a right leg mass that is painful. He reports no injury to the area. No redness noted. This mass was noted today.  Objective: Vitals:   11/22/19 0816 11/22/19 0820 11/22/19 0922 11/22/19 1222  BP: (!) 161/73 (!) 171/82  (!) 133/64  Pulse: 70 70  61  Resp:  16  18  Temp: 98 F (36.7 C)   97.9 F (36.6 C)  TempSrc: Oral   Oral  SpO2: 95%  96% (!) 89%  Weight:      Height:        Intake/Output Summary (Last 24 hours) at 11/22/2019 1638 Last data filed at 11/22/2019 1403 Gross per 24 hour  Intake 720 ml  Output 425 ml  Net 295 ml   Filed Weights   11/15/19 1500 11/21/19 0500 11/22/19 0500  Weight: (!) 140 kg (!) 137.8 kg (!) 136.9 kg    Examination:  General exam: Appears calm and comfortable Respiratory system: Diminished. No wheezing. Respiratory effort normal. Cardiovascular system: S1 & S2 heard, RRR. No murmurs, rubs, gallops or clicks. Gastrointestinal system: Abdomen is nondistended, soft and nontender. No organomegaly or masses felt. Normal bowel sounds heard. Central nervous system: Alert and oriented. No focal neurological deficits. Musculoskeletal: Right lower leg with medially located tender mass with fluctuation noted on palpation Skin: No cyanosis. No rashes Psychiatry: Judgement and insight appear normal. Mood & affect appropriate.     Data Reviewed: I have personally reviewed following labs and imaging studies  CBC Lab Results  Component Value Date   WBC 7.7 11/21/2019   RBC 2.66 (L) 11/21/2019   HGB 8.1 (L) 11/21/2019   HCT 24.2 (L) 11/21/2019   MCV 91.0 11/21/2019   MCH 30.5 11/21/2019   PLT 124 (L) 11/21/2019   MCHC 33.5 11/21/2019   RDW 15.2 11/21/2019   LYMPHSABS 0.7 11/21/2019   MONOABS 0.7 11/21/2019    EOSABS 0.2 11/21/2019   BASOSABS 0.1 81/85/6314     Last metabolic panel Lab Results  Component Value Date   NA 139 11/21/2019   K 4.3 11/21/2019   CL 96 (L) 11/21/2019   CO2 25 11/21/2019   BUN 96 (H) 11/21/2019   CREATININE 10.23 (H) 11/21/2019   GLUCOSE 257 (H) 11/21/2019   GFRNONAA 5 (L) 11/21/2019   GFRAA 6 (L) 11/21/2019   CALCIUM 9.7 11/21/2019   PHOS 9.6 (H) 11/17/2019   PROT 7.9 11/19/2019   ALBUMIN 3.3 (L) 11/19/2019   LABGLOB 2.2 02/20/2018   AGRATIO 1.7 02/20/2018   BILITOT 0.7 11/19/2019   ALKPHOS 250 (H) 11/19/2019   AST 22 11/19/2019   ALT 30 11/19/2019   ANIONGAP 18 (H) 11/21/2019    CBG (last 3)  Recent Labs    11/21/19 2130 11/22/19 0830 11/22/19 1219  GLUCAP 227* 204* 205*     GFR: Estimated Creatinine Clearance: 10.6 mL/min (A) (by C-G formula based on SCr of 10.23 mg/dL (H)).  Coagulation Profile: No results for input(s): INR, PROTIME in the last 168 hours.  Recent Results (from the past 240 hour(s))  Blood Culture (routine x 2)     Status: None   Collection Time: 11/15/19  5:28 PM   Specimen: BLOOD  Result Value Ref Range Status   Specimen Description BLOOD BLOOD RIGHT FOREARM  Final   Special Requests   Final    BOTTLES DRAWN AEROBIC AND ANAEROBIC Blood Culture adequate volume  Culture   Final    NO GROWTH 5 DAYS Performed at [facility], [address] [place], [place] [address]    Report Status 11/20/2019 FINAL  Final  Blood Culture (routine x 2)     Status: None   Collection Time: 11/15/19  5:28 PM   Specimen: BLOOD  Result Value Ref Range Status   Specimen Description BLOOD RIGHT ANTECUBITAL  Final   Special Requests   Final    BOTTLES DRAWN AEROBIC AND ANAEROBIC Blood Culture results may not be optimal due to an inadequate volume of blood received in culture bottles   Culture   Final    NO GROWTH 5 DAYS Performed at [facility], [address], [place], [place] [address]    Report Status  11/20/2019 FINAL  Final  SARS Coronavirus 2 by RT PCR (hospital order, performed in [place] hospital lab) Nasopharyngeal Nasopharyngeal Swab     Status: None   Collection Time: 11/15/19  7:14 PM   Specimen: Nasopharyngeal Swab  Result Value Ref Range Status   SARS Coronavirus 2 NEGATIVE NEGATIVE Final    Comment: (NOTE) SARS-CoV-2 target nucleic acids are NOT DETECTED.  The SARS-CoV-2 RNA is generally detectable in upper and lower respiratory specimens during the acute phase of infection. The lowest concentration of SARS-CoV-2 viral copies this assay can detect is 250 copies / mL. A negative result does not preclude SARS-CoV-2 infection and should not be used as the sole basis for treatment or other patient management decisions.  A negative result may occur with improper specimen collection / handling, submission of specimen other than nasopharyngeal swab, presence of viral mutation(s) within the areas targeted by this assay, and inadequate number of viral copies (<250 copies / mL). A negative result must be combined with clinical observations, patient history, and epidemiological information.  Fact Sheet for Patients:   StrictlyIdeas.no  Fact Sheet for Healthcare Providers: BankingDealers.co.za  This test is not yet approved or  cleared by the [place] FDA and has been authorized for detection and/or diagnosis of SARS-CoV-2 by FDA under an Emergency Use Authorization (EUA).  This EUA will remain in effect (meaning this test can be used) for the duration of the COVID-19 declaration under Section 564(b)(1) of the Act, 21 U.S.C. section 360bbb-3(b)(1), unless the authorization is terminated or revoked sooner.  Performed at [facility], [address] [place], [place] [address]   Expectorated sputum assessment w rflx to resp cult     Status: None   Collection Time: 11/19/19  6:00 AM   Specimen: Sputum  Result  Value Ref Range Status   Specimen Description SPUTUM  Final   Special Requests NONE  Final   Sputum evaluation   Final    Sputum specimen not acceptable for testing.  Please recollect.   NOTIFIED VICTORIA BADGETT AT 0822 OF NEED FOR RECOLLECT. [facility] Performed at [facility], [address] [place], [place] [address]    Report Status 11/20/2019 FINAL  Final  Respiratory Panel by PCR     Status: None   Collection Time: 11/19/19 12:57 PM   Specimen: Nasopharyngeal Swab; Respiratory  Result Value Ref Range Status   Adenovirus NOT DETECTED NOT DETECTED Final   Coronavirus 229E NOT DETECTED NOT DETECTED Final    Comment: (NOTE) The Coronavirus on the Respiratory Panel, DOES NOT test for the novel  Coronavirus (2019 nCoV)    Coronavirus HKU1 NOT DETECTED NOT DETECTED Final   Coronavirus NL63 NOT DETECTED NOT  DETECTED Final   Coronavirus OC43 NOT DETECTED NOT DETECTED Final   Metapneumovirus NOT DETECTED NOT DETECTED Final   Rhinovirus / Enterovirus NOT DETECTED NOT DETECTED Final   Influenza A NOT DETECTED NOT DETECTED Final   Influenza B NOT DETECTED NOT DETECTED Final   Parainfluenza Virus 1 NOT DETECTED NOT DETECTED Final   Parainfluenza Virus 2 NOT DETECTED NOT DETECTED Final   Parainfluenza Virus 3 NOT DETECTED NOT DETECTED Final   Parainfluenza Virus 4 NOT DETECTED NOT DETECTED Final   Respiratory Syncytial Virus NOT DETECTED NOT DETECTED Final   Bordetella pertussis NOT DETECTED NOT DETECTED Final   Chlamydophila pneumoniae NOT DETECTED NOT DETECTED Final   Mycoplasma pneumoniae NOT DETECTED NOT DETECTED Final    Comment: Performed at [facility], [place] [address], [place], [place] [address]  MRSA PCR Screening     Status: None   Collection Time: 11/19/19 12:57 PM   Specimen: Nasopharyngeal  Result Value Ref Range Status   MRSA by PCR NEGATIVE NEGATIVE Final    Comment:        The GeneXpert MRSA Assay (FDA approved for NASAL specimens only), is one component  of a comprehensive MRSA colonization surveillance program. It is not intended to diagnose MRSA infection nor to guide or monitor treatment for MRSA infections. Performed at [facility], [address] [place], [place] [address]   Expectorated sputum assessment w rflx to resp cult     Status: None   Collection Time: 11/19/19 12:58 PM   Specimen: Sputum  Result Value Ref Range Status   Specimen Description SPUTUM  Final   Special Requests SPUTUM  Final   Sputum evaluation   Final    Sputum specimen not acceptable for testing.  Please recollect.   Early Osmond NOTIFIED AT 1417 ON 11/19/19 [facility] Performed at [facility], [address] [place], [place] [address]    Report Status 11/19/2019 FINAL  Final        Radiology Studies: [facility] Chest Port 1 View  Result Date: 11/21/2019 CLINICAL DATA:  Abnormal chest radiograph EXAM: PORTABLE CHEST 1 VIEW COMPARISON:  CT 11/19/2019, radiograph 11/18/2019 FINDINGS: Dual lumen right IJ approach dialysis catheter tip terminates at the superior cavoatrial junction in stable position from prior. Telemetry leads overlie the chest. Redemonstration of the bilateral patchy airspace opacities most pronounced in the left mid to lower lung and right lung base. Stable bandlike opacity in the right mid lung likely reflecting some subsegmental atelectasis and/or scarring as was present on comparison CT. No pneumothorax or visible effusion. Cardiomediastinal contours are stable. No acute osseous or soft tissue abnormality. Degenerative changes are present in the imaged spine and shoulders. IMPRESSION: Persistent bilateral patchy airspace opacities, perhaps mildly improved in the left mid lung but with accentuation in the bases likely related to diminished volumes. Stable satisfactory position of a right IJ approach dual lumen central venous catheter. Electronically Signed   By: Lovena Le M.D.   On: 11/21/2019 18:35        Scheduled Meds: .  sodium chloride   Intravenous Once  . amiodarone  200 mg Oral Daily  . amitriptyline  25 mg Oral QHS  . vitamin C  1,000 mg Oral BID  . aspirin EC  81 mg Oral Daily  . atorvastatin  40 mg Oral Daily  . calcitRIOL  0.5 mcg Oral Daily  . calcium acetate  1,334 mg Oral TID WC  . Chlorhexidine Gluconate Cloth  6 each Topical Q0600  .  doxycycline  100 mg Oral Q12H  . febuxostat  80 mg Oral Daily  . fluticasone furoate-vilanterol  1 puff Inhalation Daily  . furosemide  40 mg Oral Daily  . gentamicin cream  1 application Topical Daily  . hydrALAZINE  25 mg Oral TID  . insulin aspart  0-6 Units Subcutaneous TID WC  . insulin regular human CONCENTRATED  15 Units Subcutaneous BID WC  . ipratropium-albuterol  3 mL Inhalation Daily  . lactulose  10 g Oral Daily  . levothyroxine  100 mcg Oral Q0600  . loratadine  10 mg Oral Daily  . metoprolol tartrate  25 mg Oral BID  . pantoprazole  40 mg Oral BID  . sodium chloride flush  3 mL Intravenous Q12H   Continuous Infusions: . sodium chloride 250 mL (11/18/19 1814)  . sodium chloride    . dialysis solution 2.5% low-MG/low-CA 0 mL (11/21/19 0635)     LOS: 7 days     Cordelia Poche, MD [facility] 11/22/2019, 4:38 PM  If 7PM-7AM, please contact night-coverage [email or web address]

## 2019-11-22 NOTE — Progress Notes (Signed)
Inpatient Diabetes Program Recommendations  AACE/ADA: New Consensus Statement on Inpatient Glycemic Control   Target Ranges:  Prepandial:   less than 140 mg/dL      Peak postprandial:   less than 180 mg/dL (1-2 hours)      Critically ill patients:  140 - 180 mg/dL   Results for DHILAN, BRAUER (MRN [id number]) as of 11/22/2019 09:06  Ref. Range 11/21/2019 07:51 11/21/2019 11:38 11/21/2019 16:01 11/21/2019 21:30 11/22/2019 08:30  Glucose-Capillary Latest Ref Range: 70 - 99 mg/dL 214 (H) 188 (H) 170 (H) 227 (H) 204 (H)   Review of Glycemic Control  Diabetes history: DM2 Outpatient Diabetes medications: Humulin R U500 60 units QAM, 50 units QPM Current orders for Inpatient glycemic control: Humulin R U500 15 units BID, Novolog 0-6 units TID with meals  Inpatient Diabetes Program Recommendations:    Insulin-Please consider increasing Humulin R U500 to 20 units BID.  Thanks, Barnie Alderman, RN, MSN, CDE Diabetes Coordinator Inpatient Diabetes Program [phone] (Team Pager from 8am to 5pm)

## 2019-11-23 ENCOUNTER — Inpatient Hospital Stay: Payer: Commercial Managed Care - PPO

## 2019-11-23 LAB — GLUCOSE, CAPILLARY
Glucose-Capillary: 161 mg/dL — ABNORMAL HIGH (ref 70–99)
Glucose-Capillary: 273 mg/dL — ABNORMAL HIGH (ref 70–99)

## 2019-11-23 MED ORDER — DOXYCYCLINE HYCLATE 100 MG PO TABS: 100.0000 mg | ORAL_TABLET | Freq: Two times a day (BID) | ORAL | 0 refills | Status: AC

## 2019-11-23 NOTE — Progress Notes (Signed)
Patient received and understands discharge instructions 

## 2019-11-23 NOTE — Discharge Instructions (Signed)
Patrick Hurst,  You were in the hospital because of pneumonia. This was treated with antibiotics and you have improved. You still require oxygen while walking so you will go home with oxygen. Please follow-up with your primary care physician and pulmonologist.

## 2019-11-23 NOTE — Progress Notes (Signed)
[facility]  ROUNDING NOTE   Subjective:   Peritoneal dialysis last night. Tolerated treatment well.    Wife at bedside.   MRI negative for liver lesion  Objective:  Vital signs in last 24 hours:  Temp:  [97.5 F (36.4 C)-98.4 F (36.9 C)] 97.5 F (36.4 C) (07/23 0825) Pulse Rate:  [61-71] 68 (07/23 0825) Resp:  [16-20] 18 (07/23 0825) BP: (133-177)/(64-102) 153/71 (07/23 0825) SpO2:  [89 %-99 %] 99 % (07/23 0825)  Weight change:  Filed Weights   11/15/19 1500 11/21/19 0500 11/22/19 0500  Weight: (!) 140 kg (!) 137.8 kg (!) 136.9 kg    Intake/Output: I/O last 3 completed shifts: In: 600 [P.O.:600] Out: 425 [Urine:425]   Intake/Output this shift:  Total I/O In: 240 [P.O.:240] Out: -   Physical Exam: General: No acute distress  Head: Normocephalic, atraumatic. Moist oral mucosal membranes  Eyes: Anicteric  Neck: Supple, trachea midline  Lungs:  Diminished on left, 2L [place] O2  Heart: regular  Abdomen:  Soft, nontender, bowel sounds present  Extremities: no peripheral edema.  Neurologic: Awake, alert, following commands  Skin: No lesions  Access: PD catheter in place, right IJ PermCath in place, developing left upper extremity AV access    Basic Metabolic Panel: Recent Labs  Lab 11/17/19 0315 11/17/19 0315 11/17/19 2035 11/18/19 0357 11/18/19 0357 11/19/19 0843 11/20/19 0959 11/21/19 0439  NA 138  --   --  136  --  137 136 139  K 4.2  --   --  4.0  --  4.5 4.7 4.3  CL 95*  --   --  94*  --  94* 94* 96*  CO2 27  --   --  24  --  25 26 25   GLUCOSE 139*  --   --  169*  --  243* 274* 257*  BUN 77*  --   --  89*  --  91* 92* 96*  CREATININE 8.22*  --   --  9.38*  --  9.57* 10.08* 10.23*  CALCIUM 8.4*   < >  --  8.3*   < > 9.1 8.6* 9.7  PHOS  --   --  9.6*  --   --   --   --   --    < > = values in this interval not displayed.    Liver Function Tests: Recent Labs  Lab 11/19/19 0843  AST 22  ALT 30  ALKPHOS 250*  BILITOT 0.7  PROT  7.9  ALBUMIN 3.3*   No results for input(s): LIPASE, AMYLASE in the last 168 hours. No results for input(s): AMMONIA in the last 168 hours.  CBC: Recent Labs  Lab 11/17/19 0315 11/18/19 0357 11/19/19 0843 11/21/19 0439  WBC 7.3 7.8 6.3 7.7  NEUTROABS  --   --   --  5.6  HGB 7.3* 7.5* 8.0* 8.1*  HCT 21.6* 22.0* 24.0* 24.2*  MCV 90.8 89.4 91.6 91.0  PLT 138* 129* 125* 124*    Cardiac Enzymes: No results for input(s): CKTOTAL, CKMB, CKMBINDEX, TROPONINI in the last 168 hours.  BNP: Invalid input(s): POCBNP  CBG: Recent Labs  Lab 11/22/19 0830 11/22/19 1219 11/22/19 1722 11/22/19 2001 11/23/19 0826  GLUCAP 204* 205* 344* 180* 161*    Microbiology: Results for orders placed or performed during the hospital encounter of 11/15/19  Blood Culture (routine x 2)     Status: None   Collection Time: 11/15/19  5:28 PM   Specimen: BLOOD  Result Value Ref Range Status   Specimen Description BLOOD BLOOD RIGHT FOREARM  Final   Special Requests   Final    BOTTLES DRAWN AEROBIC AND ANAEROBIC Blood Culture adequate volume   Culture   Final    NO GROWTH 5 DAYS Performed at [facility], [address] [place], [place] [address]    Report Status 11/20/2019 FINAL  Final  Blood Culture (routine x 2)     Status: None   Collection Time: 11/15/19  5:28 PM   Specimen: BLOOD  Result Value Ref Range Status   Specimen Description BLOOD RIGHT ANTECUBITAL  Final   Special Requests   Final    BOTTLES DRAWN AEROBIC AND ANAEROBIC Blood Culture results may not be optimal due to an inadequate volume of blood received in culture bottles   Culture   Final    NO GROWTH 5 DAYS Performed at [facility], [address], [place], [place] [address]    Report Status 11/20/2019 FINAL  Final  SARS Coronavirus 2 by RT PCR (hospital order, performed in [facility] hospital lab) Nasopharyngeal Nasopharyngeal Swab     Status: None   Collection Time: 11/15/19  7:14 PM   Specimen:  Nasopharyngeal Swab  Result Value Ref Range Status   SARS Coronavirus 2 NEGATIVE NEGATIVE Final    Comment: (NOTE) SARS-CoV-2 target nucleic acids are NOT DETECTED.  The SARS-CoV-2 RNA is generally detectable in upper and lower respiratory specimens during the acute phase of infection. The lowest concentration of SARS-CoV-2 viral copies this assay can detect is 250 copies / mL. A negative result does not preclude SARS-CoV-2 infection and should not be used as the sole basis for treatment or other patient management decisions.  A negative result may occur with improper specimen collection / handling, submission of specimen other than nasopharyngeal swab, presence of viral mutation(s) within the areas targeted by this assay, and inadequate number of viral copies (<250 copies / mL). A negative result must be combined with clinical observations, patient history, and epidemiological information.  Fact Sheet for Patients:   StrictlyIdeas.no  Fact Sheet for Healthcare Providers: BankingDealers.co.za  This test is not yet approved or  cleared by the [place] FDA and has been authorized for detection and/or diagnosis of SARS-CoV-2 by FDA under an Emergency Use Authorization (EUA).  This EUA will remain in effect (meaning this test can be used) for the duration of the COVID-19 declaration under Section 564(b)(1) of the Act, 21 U.S.C. section 360bbb-3(b)(1), unless the authorization is terminated or revoked sooner.  Performed at [facility], [address] [place], [place] [address]   Expectorated sputum assessment w rflx to resp cult     Status: None   Collection Time: 11/19/19  6:00 AM   Specimen: Sputum  Result Value Ref Range Status   Specimen Description SPUTUM  Final   Special Requests NONE  Final   Sputum evaluation   Final    Sputum specimen not acceptable for testing.  Please recollect.   NOTIFIED VICTORIA BADGETT  AT 0822 OF NEED FOR RECOLLECT. [facility] Performed at [facility], [address] [place], [place] [address]    Report Status 11/20/2019 FINAL  Final  Respiratory Panel by PCR     Status: None   Collection Time: 11/19/19 12:57 PM   Specimen: Nasopharyngeal Swab; Respiratory  Result Value Ref Range Status   Adenovirus NOT DETECTED NOT DETECTED Final   Coronavirus 229E NOT DETECTED NOT DETECTED Final  Comment: (NOTE) The Coronavirus on the Respiratory Panel, DOES NOT test for the novel  Coronavirus (2019 nCoV)    Coronavirus HKU1 NOT DETECTED NOT DETECTED Final   Coronavirus NL63 NOT DETECTED NOT DETECTED Final   Coronavirus OC43 NOT DETECTED NOT DETECTED Final   Metapneumovirus NOT DETECTED NOT DETECTED Final   Rhinovirus / Enterovirus NOT DETECTED NOT DETECTED Final   Influenza A NOT DETECTED NOT DETECTED Final   Influenza B NOT DETECTED NOT DETECTED Final   Parainfluenza Virus 1 NOT DETECTED NOT DETECTED Final   Parainfluenza Virus 2 NOT DETECTED NOT DETECTED Final   Parainfluenza Virus 3 NOT DETECTED NOT DETECTED Final   Parainfluenza Virus 4 NOT DETECTED NOT DETECTED Final   Respiratory Syncytial Virus NOT DETECTED NOT DETECTED Final   Bordetella pertussis NOT DETECTED NOT DETECTED Final   Chlamydophila pneumoniae NOT DETECTED NOT DETECTED Final   Mycoplasma pneumoniae NOT DETECTED NOT DETECTED Final    Comment: Performed at [facility], [address], [place], [place] [address]  MRSA PCR Screening     Status: None   Collection Time: 11/19/19 12:57 PM   Specimen: Nasopharyngeal  Result Value Ref Range Status   MRSA by PCR NEGATIVE NEGATIVE Final    Comment:        The GeneXpert MRSA Assay (FDA approved for NASAL specimens only), is one component of a comprehensive MRSA colonization surveillance program. It is not intended to diagnose MRSA infection nor to guide or monitor treatment for MRSA infections. Performed at [facility], [address] [place], [place] [address]   Expectorated sputum assessment w rflx to resp cult     Status: None   Collection Time: 11/19/19 12:58 PM   Specimen: Sputum  Result Value Ref Range Status   Specimen Description SPUTUM  Final   Special Requests SPUTUM  Final   Sputum evaluation   Final    Sputum specimen not acceptable for testing.  Please recollect.   Early Osmond NOTIFIED AT 1417 ON 11/19/19 [facility] Performed at [facility], [address] [place], [place] [address]    Report Status 11/19/2019 FINAL  Final    Coagulation Studies: No results for input(s): LABPROT, INR in the last 72 hours.  Urinalysis: No results for input(s): COLORURINE, LABSPEC, PHURINE, GLUCOSEU, HGBUR, BILIRUBINUR, KETONESUR, PROTEINUR, UROBILINOGEN, NITRITE, LEUKOCYTESUR in the last 72 hours.  Invalid input(s): APPERANCEUR    Imaging: DG Ankle Complete Right  Result Date: 11/23/2019 CLINICAL DATA:  Right ankle pain for several days, no known injury, initial encounter EXAM: RIGHT ANKLE - COMPLETE 3+ VIEW COMPARISON:  10/09/2015 FINDINGS: No acute fracture or dislocation is noted. Considerable degenerative changes are noted in the tarsal bones. Associated soft tissue swelling noted. IMPRESSION: Tarsal degenerative change without acute abnormality. Electronically Signed   By: Inez Catalina M.D.   On: 11/23/2019 11:52   MR ABDOMEN WO CONTRAST  Result Date: 11/23/2019 CLINICAL DATA:  Evaluate indeterminate liver lesion EXAM: MRI ABDOMEN WITHOUT CONTRAST TECHNIQUE: Multiplanar multisequence MR imaging was performed without the administration of intravenous contrast. COMPARISON:  Ultrasound abdomen 11/20/2019 FINDINGS: Lower chest: No acute abnormality. Hepatobiliary: Within segment 4B and segment 5, adjacent to the gallbladder fossa, there are several areas loss of signal on the out of phase sequence which correspond to the focal areas of low attenuation seen on CT from 11/19/2019 and ultrasound from 11/20/2019. This  is consistent with benign focal fatty deposition. Cholecystectomy. No significant bile duct dilatation. Pancreas: No mass, inflammatory changes, or  other parenchymal abnormality identified. Spleen: The spleen measures 15.3 by 12.7 x 6.4 cm (volume = 650 cm^3). No focal splenic lesion identified. Adrenals/Urinary Tract: Normal appearance of the adrenal glands. Bilateral kidney cysts are noted. No hydronephrosis. Stomach/Bowel: Visualized portions within the abdomen are unremarkable. Vascular/Lymphatic: No pathologically enlarged lymph nodes identified. No abdominal aortic aneurysm demonstrated. Other:  No free fluid or fluid collections Musculoskeletal: No suspicious bone lesions identified. IMPRESSION: 1. There are several areas of loss of signal on the out of phase sequence which correspond to the focal areas of low attenuation seen on CT from 11/19/2019 and ultrasound from 11/20/2019. This is consistent with benign focal fatty deposition. Within the limitations of unenhanced technique there are no suspicious liver lesions. 2. Splenomegaly. 3. Bilateral kidney cysts. Electronically Signed   By: Kerby Moors M.D.   On: 11/23/2019 07:25   DG Chest Port 1 View  Result Date: 11/21/2019 CLINICAL DATA:  Abnormal chest radiograph EXAM: PORTABLE CHEST 1 VIEW COMPARISON:  CT 11/19/2019, radiograph 11/18/2019 FINDINGS: Dual lumen right IJ approach dialysis catheter tip terminates at the superior cavoatrial junction in stable position from prior. Telemetry leads overlie the chest. Redemonstration of the bilateral patchy airspace opacities most pronounced in the left mid to lower lung and right lung base. Stable bandlike opacity in the right mid lung likely reflecting some subsegmental atelectasis and/or scarring as was present on comparison CT. No pneumothorax or visible effusion. Cardiomediastinal contours are stable. No acute osseous or soft tissue abnormality. Degenerative changes are present in the imaged spine and  shoulders. IMPRESSION: Persistent bilateral patchy airspace opacities, perhaps mildly improved in the left mid lung but with accentuation in the bases likely related to diminished volumes. Stable satisfactory position of a right IJ approach dual lumen central venous catheter. Electronically Signed   By: Lovena Le M.D.   On: 11/21/2019 18:35   [place] RT LOWER EXTREM LTD SOFT TISSUE NON VASCULAR  Result Date: 11/22/2019 CLINICAL DATA:  Fluctuant mass on the medial aspect of the right leg EXAM: ULTRASOUND RIGHT LOWER EXTREMITY LIMITED TECHNIQUE: Ultrasound examination of the lower extremity soft tissues was performed in the area of clinical concern. COMPARISON:  None. FINDINGS: The patient's palpable area of concern corresponds to an area of subcutaneous edema. There is no well-formed fluid collection or abscess. There may be some mild overlying skin thickening. There is no mass. IMPRESSION: Lower extremity edema without evidence for an abscess, fluid collection, or mass. Electronically Signed   By: Constance Holster M.D.   On: 11/22/2019 19:13     Medications:    sodium chloride 250 mL (11/18/19 1814)   sodium chloride     dialysis solution 2.5% low-MG/low-CA 0 mL (11/21/19 0635)    sodium chloride   Intravenous Once   amiodarone  200 mg Oral Daily   amitriptyline  25 mg Oral QHS   vitamin C  1,000 mg Oral BID   aspirin EC  81 mg Oral Daily   atorvastatin  40 mg Oral Daily   calcitRIOL  0.5 mcg Oral Daily   calcium acetate  1,334 mg Oral TID WC   Chlorhexidine Gluconate Cloth  6 each Topical Q0600   doxycycline  100 mg Oral Q12H   febuxostat  80 mg Oral Daily   fluticasone furoate-vilanterol  1 puff Inhalation Daily   furosemide  40 mg Oral Daily   gentamicin cream  1 application Topical Daily   hydrALAZINE  25 mg Oral TID   insulin aspart  0-6 Units  Subcutaneous TID WC   insulin regular human CONCENTRATED  15 Units Subcutaneous BID WC   ipratropium-albuterol  3 mL  Inhalation Daily   lactulose  10 g Oral Daily   levothyroxine  100 mcg Oral Q0600   loratadine  10 mg Oral Daily   metoprolol tartrate  25 mg Oral BID   pantoprazole  40 mg Oral BID   sodium chloride flush  3 mL Intravenous Q12H   sodium chloride, sodium chloride, acetaminophen **OR** acetaminophen, albuterol, alteplase, alum & mag hydroxide-simeth, heparin, HYDROcodone-acetaminophen, lidocaine (PF), lidocaine-prilocaine, morphine injection, ondansetron (ZOFRAN) IV, pentafluoroprop-tetrafluoroeth, sodium chloride flush, traZODone  Assessment/ Plan:   Patrick Hurst is a 64 y.o. white male with end stage renal disease on peritoneal dialysis, chronic diastolic heart failure, Bell's palsy, carpal tunnel syndrome, depression, GERD, gout, who is admitted to [facility] on 11/15/2019 for CAP (community acquired pneumonia) [J18.9] Acute respiratory failure with hypoxia ([place]) [J96.01] Anemia of chronic disease [D63.8] Community acquired pneumonia, unspecified laterality [J18.9]  [facility] 139kg CCPD 9 hours 5 exchanges  1. End stage renal disease: on peritoneal dialysis. Back up hemodialysis treatment 7/16. Azotemia noted. Not currently on systemic steroids.  Did not make adequacy this month and his prescription has been changed to 5 exchanges.   2.  Anemia of chronic kidney disease status post PRBC transfusion on 7/16. No ESA on this admission.   3. Secondary hyperparathyroidism with hyperphosphatemia:   - calcitriol  - started calcium acetate this admission   4. Hypertension: well controlled. Currently on furosemide, hydralazine and metoprolol.  Home regimen includes amlodipine and torsemide which are currently being held.    LOS: 8 Tyhesha Dutson 7/23/202112:08 PM

## 2019-11-23 NOTE — Progress Notes (Signed)
Patient alert and verbal upon arrival to room. Patient presents with no c\o pain or discomfort. Patient PD cath assessed and presents with no s]s of infection, cath cleaned under sterile technique. PD machine setup and test passed without any issues and patient was connected with orders set per MD.

## 2019-11-23 NOTE — Progress Notes (Signed)
SATURATION QUALIFICATIONS: (This note is used to comply with regulatory documentation for home oxygen)  Patient Saturations on Room Air at Rest = 92%  Patient Saturations on Room Air while Ambulating = 88%  Patient Saturations on 2 Liters of oxygen while Ambulating = 94%  Please briefly explain why patient needs home oxygen: 

## 2019-11-23 NOTE — Progress Notes (Signed)
Inpatient Diabetes Program Recommendations  AACE/ADA: New Consensus Statement on Inpatient Glycemic Control   Target Ranges:  Prepandial:   less than 140 mg/dL      Peak postprandial:   less than 180 mg/dL (1-2 hours)      Critically ill patients:  140 - 180 mg/dL   Results for JAMARIUS, SAHA (MRN [id number]) as of 11/23/2019 08:14  Ref. Range 11/22/2019 08:30 11/22/2019 12:19 11/22/2019 17:22 11/22/2019 20:01  Glucose-Capillary Latest Ref Range: 70 - 99 mg/dL 204 (H)  Novolog 2 units  U500 15 units 205 (H)  Novolog 2 units 344 (H)  Novolog 4 units  U500 15 units 180 (H)   Review of Glycemic Control  Diabetes history:DM2 Outpatient Diabetes medications:Humulin R U500 60 units QAM, 50 units QPM Current orders for Inpatient glycemic control:Humulin R U500 15 units BID, Novolog 0-6 units TID with meals  Inpatient Diabetes Program Recommendations:  Insulin-Please consider increasing Humulin R U500 to 20 units BID.  Thanks, Barnie Alderman, RN, MSN, CDE Diabetes Coordinator Inpatient Diabetes Program [phone] (Team Pager from 8am to 5pm)

## 2019-11-23 NOTE — Progress Notes (Signed)
Patrick Darby, MD [address]  [place]  [place], [place] [address]  Main: [phone]  Fax: [phone] Pager: [phone]   Subjective: Patient is currently off oxygen.  Reports indigestion.  Is ready to go home today.  Underwent MRI of the liver which did not reveal liver cancer   Objective: Vital signs in last 24 hours: Vitals:   11/22/19 2342 11/23/19 0500 11/23/19 0825 11/23/19 1248  BP: (!) 143/67 (!) 140/65 (!) 153/71 (!) 163/71  Pulse: 70  68 70  Resp: 20  18 16   Temp: 98 F (36.7 C) 98.4 F (36.9 C) (!) 97.5 F (36.4 C) 98.2 F (36.8 C)  TempSrc:   Oral   SpO2: 97%  99% 94%  Weight:      Height:       Weight change:   Intake/Output Summary (Last 24 hours) at 11/23/2019 1715 Last data filed at 11/23/2019 1352 Gross per 24 hour  Intake 600 ml  Output --  Net 600 ml     Exam: Heart:: Regular rate and rhythm, S1S2 present or without murmur or extra heart sounds Lungs: normal and clear to auscultation Abdomen: soft, nontender, normal bowel sounds   Lab Results: CBC Latest Ref Rng & Units 11/21/2019 11/19/2019 11/18/2019  WBC 4.0 - 10.5 K/uL 7.7 6.3 7.8  Hemoglobin 13.0 - 17.0 g/dL 8.1(L) 8.0(L) 7.5(L)  Hematocrit 39 - 52 % 24.2(L) 24.0(L) 22.0(L)  Platelets 150 - 400 K/uL 124(L) 125(L) 129(L)   CMP Latest Ref Rng & Units 11/21/2019 11/20/2019 11/19/2019  Glucose 70 - 99 mg/dL 257(H) 274(H) 243(H)  BUN 8 - 23 mg/dL 96(H) 92(H) 91(H)  Creatinine 0.61 - 1.24 mg/dL 10.23(H) 10.08(H) 9.57(H)  Sodium 135 - 145 mmol/L 139 136 137  Potassium 3.5 - 5.1 mmol/L 4.3 4.7 4.5  Chloride 98 - 111 mmol/L 96(L) 94(L) 94(L)  CO2 22 - 32 mmol/L 25 26 25   Calcium 8.9 - 10.3 mg/dL 9.7 8.6(L) 9.1  Total Protein 6.5 - 8.1 g/dL - - 7.9  Total Bilirubin 0.3 - 1.2 mg/dL - - 0.7  Alkaline Phos 38 - 126 U/L - - 250(H)  AST 15 - 41 U/L - - 22  ALT 0 - 44 U/L - - 30    Micro Results: Recent Results (from the past 240 hour(s))  Blood Culture (routine x 2)      Status: None   Collection Time: 11/15/19  5:28 PM   Specimen: BLOOD  Result Value Ref Range Status   Specimen Description BLOOD BLOOD RIGHT FOREARM  Final   Special Requests   Final    BOTTLES DRAWN AEROBIC AND ANAEROBIC Blood Culture adequate volume   Culture   Final    NO GROWTH 5 DAYS Performed at [facility], [address] [place], [place] [address]    Report Status 11/20/2019 FINAL  Final  Blood Culture (routine x 2)     Status: None   Collection Time: 11/15/19  5:28 PM   Specimen: BLOOD  Result Value Ref Range Status   Specimen Description BLOOD RIGHT ANTECUBITAL  Final   Special Requests   Final    BOTTLES DRAWN AEROBIC AND ANAEROBIC Blood Culture results may not be optimal due to an inadequate volume of blood received in culture bottles   Culture   Final    NO GROWTH 5 DAYS Performed at [facility], [address], [place],  [address]    Report Status 11/20/2019 FINAL  Final  SARS  Coronavirus 2 by RT PCR (hospital order, performed in [facility] hospital lab) Nasopharyngeal Nasopharyngeal Swab     Status: None   Collection Time: 11/15/19  7:14 PM   Specimen: Nasopharyngeal Swab  Result Value Ref Range Status   SARS Coronavirus 2 NEGATIVE NEGATIVE Final    Comment: (NOTE) SARS-CoV-2 target nucleic acids are NOT DETECTED.  The SARS-CoV-2 RNA is generally detectable in upper and lower respiratory specimens during the acute phase of infection. The lowest concentration of SARS-CoV-2 viral copies this assay can detect is 250 copies / mL. A negative result does not preclude SARS-CoV-2 infection and should not be used as the sole basis for treatment or other patient management decisions.  A negative result may occur with improper specimen collection / handling, submission of specimen other than nasopharyngeal swab, presence of viral mutation(s) within the areas targeted by this assay, and inadequate number of viral copies (<250 copies / mL). A  negative result must be combined with clinical observations, patient history, and epidemiological information.  Fact Sheet for Patients:   StrictlyIdeas.no  Fact Sheet for Healthcare Providers: BankingDealers.co.za  This test is not yet approved or  cleared by the [place] FDA and has been authorized for detection and/or diagnosis of SARS-CoV-2 by FDA under an Emergency Use Authorization (EUA).  This EUA will remain in effect (meaning this test can be used) for the duration of the COVID-19 declaration under Section 564(b)(1) of the Act, 21 U.S.C. section 360bbb-3(b)(1), unless the authorization is terminated or revoked sooner.  Performed at [facility], [address] [place], [place] [address]   Expectorated sputum assessment w rflx to resp cult     Status: None   Collection Time: 11/19/19  6:00 AM   Specimen: Sputum  Result Value Ref Range Status   Specimen Description SPUTUM  Final   Special Requests NONE  Final   Sputum evaluation   Final    Sputum specimen not acceptable for testing.  Please recollect.   NOTIFIED VICTORIA BADGETT AT 0822 OF NEED FOR RECOLLECT. [name] Performed at [facility], [address] [place], [place] [address]    Report Status 11/20/2019 FINAL  Final  Respiratory Panel by PCR     Status: None   Collection Time: 11/19/19 12:57 PM   Specimen: Nasopharyngeal Swab; Respiratory  Result Value Ref Range Status   Adenovirus NOT DETECTED NOT DETECTED Final   Coronavirus 229E NOT DETECTED NOT DETECTED Final    Comment: (NOTE) The Coronavirus on the Respiratory Panel, DOES NOT test for the novel  Coronavirus (2019 nCoV)    Coronavirus HKU1 NOT DETECTED NOT DETECTED Final   Coronavirus NL63 NOT DETECTED NOT DETECTED Final   Coronavirus OC43 NOT DETECTED NOT DETECTED Final   Metapneumovirus NOT DETECTED NOT DETECTED Final   Rhinovirus / Enterovirus NOT DETECTED NOT DETECTED Final    Influenza A NOT DETECTED NOT DETECTED Final   Influenza B NOT DETECTED NOT DETECTED Final   Parainfluenza Virus 1 NOT DETECTED NOT DETECTED Final   Parainfluenza Virus 2 NOT DETECTED NOT DETECTED Final   Parainfluenza Virus 3 NOT DETECTED NOT DETECTED Final   Parainfluenza Virus 4 NOT DETECTED NOT DETECTED Final   Respiratory Syncytial Virus NOT DETECTED NOT DETECTED Final   Bordetella pertussis NOT DETECTED NOT DETECTED Final   Chlamydophila pneumoniae NOT DETECTED NOT DETECTED Final   Mycoplasma pneumoniae NOT DETECTED NOT DETECTED Final    Comment: Performed at [facility], [place], [place], [place]  [address]  MRSA PCR Screening     Status: None   Collection Time: 11/19/19 12:57 PM   Specimen: Nasopharyngeal  Result Value Ref Range Status   MRSA by PCR NEGATIVE NEGATIVE Final    Comment:        The GeneXpert MRSA Assay (FDA approved for NASAL specimens only), is one component of a comprehensive MRSA colonization surveillance program. It is not intended to diagnose MRSA infection nor to guide or monitor treatment for MRSA infections. Performed at [facility], [address] [place], [place] [address]   Expectorated sputum assessment w rflx to resp cult     Status: None   Collection Time: 11/19/19 12:58 PM   Specimen: Sputum  Result Value Ref Range Status   Specimen Description SPUTUM  Final   Special Requests SPUTUM  Final   Sputum evaluation   Final    Sputum specimen not acceptable for testing.  Please recollect.   Early Osmond NOTIFIED AT 1417 ON 11/19/19 [facility] Performed at [facility], [address] [place], [place] [address]    Report Status 11/19/2019 FINAL  Final   Studies/Results: DG Ankle Complete Right  Result Date: 11/23/2019 CLINICAL DATA:  Right ankle pain for several days, no known injury, initial encounter EXAM: RIGHT ANKLE - COMPLETE 3+ VIEW COMPARISON:  10/09/2015 FINDINGS: No acute fracture or dislocation is noted.  Considerable degenerative changes are noted in the tarsal bones. Associated soft tissue swelling noted. IMPRESSION: Tarsal degenerative change without acute abnormality. Electronically Signed   By: Inez Catalina M.D.   On: 11/23/2019 11:52   MR ABDOMEN WO CONTRAST  Result Date: 11/23/2019 CLINICAL DATA:  Evaluate indeterminate liver lesion EXAM: MRI ABDOMEN WITHOUT CONTRAST TECHNIQUE: Multiplanar multisequence MR imaging was performed without the administration of intravenous contrast. COMPARISON:  Ultrasound abdomen 11/20/2019 FINDINGS: Lower chest: No acute abnormality. Hepatobiliary: Within segment 4B and segment 5, adjacent to the gallbladder fossa, there are several areas loss of signal on the out of phase sequence which correspond to the focal areas of low attenuation seen on CT from 11/19/2019 and ultrasound from 11/20/2019. This is consistent with benign focal fatty deposition. Cholecystectomy. No significant bile duct dilatation. Pancreas: No mass, inflammatory changes, or other parenchymal abnormality identified. Spleen: The spleen measures 15.3 by 12.7 x 6.4 cm (volume = 650 cm^3). No focal splenic lesion identified. Adrenals/Urinary Tract: Normal appearance of the adrenal glands. Bilateral kidney cysts are noted. No hydronephrosis. Stomach/Bowel: Visualized portions within the abdomen are unremarkable. Vascular/Lymphatic: No pathologically enlarged lymph nodes identified. No abdominal aortic aneurysm demonstrated. Other:  No free fluid or fluid collections Musculoskeletal: No suspicious bone lesions identified. IMPRESSION: 1. There are several areas of loss of signal on the out of phase sequence which correspond to the focal areas of low attenuation seen on CT from 11/19/2019 and ultrasound from 11/20/2019. This is consistent with benign focal fatty deposition. Within the limitations of unenhanced technique there are no suspicious liver lesions. 2. Splenomegaly. 3. Bilateral kidney cysts.  Electronically Signed   By: Kerby Moors M.D.   On: 11/23/2019 07:25   DG Chest Port 1 View  Result Date: 11/21/2019 CLINICAL DATA:  Abnormal chest radiograph EXAM: PORTABLE CHEST 1 VIEW COMPARISON:  CT 11/19/2019, radiograph 11/18/2019 FINDINGS: Dual lumen right IJ approach dialysis catheter tip terminates at the superior cavoatrial junction in stable position from prior. Telemetry leads overlie the chest. Redemonstration of the bilateral patchy airspace opacities most pronounced in the left mid to lower lung and right lung  base. Stable bandlike opacity in the right mid lung likely reflecting some subsegmental atelectasis and/or scarring as was present on comparison CT. No pneumothorax or visible effusion. Cardiomediastinal contours are stable. No acute osseous or soft tissue abnormality. Degenerative changes are present in the imaged spine and shoulders. IMPRESSION: Persistent bilateral patchy airspace opacities, perhaps mildly improved in the left mid lung but with accentuation in the bases likely related to diminished volumes. Stable satisfactory position of a right IJ approach dual lumen central venous catheter. Electronically Signed   By: Lovena Le M.D.   On: 11/21/2019 18:35   [place] RT LOWER EXTREM LTD SOFT TISSUE NON VASCULAR  Result Date: 11/22/2019 CLINICAL DATA:  Fluctuant mass on the medial aspect of the right leg EXAM: ULTRASOUND RIGHT LOWER EXTREMITY LIMITED TECHNIQUE: Ultrasound examination of the lower extremity soft tissues was performed in the area of clinical concern. COMPARISON:  None. FINDINGS: The patient's palpable area of concern corresponds to an area of subcutaneous edema. There is no well-formed fluid collection or abscess. There may be some mild overlying skin thickening. There is no mass. IMPRESSION: Lower extremity edema without evidence for an abscess, fluid collection, or mass. Electronically Signed   By: Constance Holster M.D.   On: 11/22/2019 19:13   Medications:  I  have reviewed the patient's current medications. Prior to Admission:  Medications Prior to Admission  Medication Sig Dispense Refill Last Dose  . albuterol (PROAIR HFA) 108 (90 Base) MCG/ACT inhaler Inhale 2 puffs into the lungs every 6 (six) hours as needed for wheezing or shortness of breath. 54 g 4 PRN at PRN  . amiodarone (PACERONE) 200 MG tablet Take 200 mg by mouth daily.   11/15/2019 at 1000  . amitriptyline (ELAVIL) 25 MG tablet TAKE 1 TABLET AT BEDTIME (Patient taking differently: Take 25 mg by mouth at bedtime. ) 90 tablet 3 11/14/2019 at 2200  . amLODipine (NORVASC) 5 MG tablet Take 1 tablet (5 mg total) by mouth daily. 90 tablet 4 11/15/2019 at 1000  . Ascorbic Acid (VITAMIN C) 1000 MG tablet Take 1,000 mg by mouth 2 (two) times daily.    11/15/2019 at 1000  . atorvastatin (LIPITOR) 40 MG tablet TAKE 1 TABLET DAILY 90 tablet 0 11/15/2019 at 1000  . calcitRIOL (ROCALTROL) 0.5 MCG capsule Take 0.5 mcg by mouth daily.   11/15/2019 at 1000  . cetirizine (ZYRTEC) 10 MG tablet Take 10 mg by mouth daily.    11/15/2019 at 1000  . doxycycline (VIBRA-TABS) 100 MG tablet Take 1 tablet (100 mg total) by mouth 2 (two) times daily. 20 tablet 0 11/15/2019 at 1000  . ELIQUIS 2.5 MG TABS tablet Take 2.5 mg by mouth 2 (two) times daily.    11/15/2019 at 1000  . fluticasone furoate-vilanterol (BREO ELLIPTA) 100-25 MCG/INH AEPB Inhale 1 puff into the lungs daily. 180 each 3 11/15/2019 at 1000  . gentamicin cream (GARAMYCIN) 0.1 % Apply 1 application topically daily.   11/15/2019 at 1000  . hydrALAZINE (APRESOLINE) 25 MG tablet TAKE 1 TABLET THREE TIMES A DAY (Patient taking differently: Take 25 mg by mouth 3 (three) times daily. ) 270 tablet 4 11/15/2019 at 1000  . insulin regular human CONCENTRATED (HUMULIN R) 500 UNIT/ML injection Inject up to 25 units twice a day, or as directed by physician (Patient taking differently: Inject 50 Units into the skin 2 (two) times daily with a meal. 60 UNITS IN THE AM & 50 UNITS AT  NIGHT) 60 mL 3 11/15/2019 at 1000  .  ipratropium-albuterol (DUONEB) 0.5-2.5 (3) MG/3ML SOLN INHALE 3 ML BY NEBULIZATION EVERY 4 HOURS AS NEEDED (Patient taking differently: Inhale 3 mLs into the lungs daily. ) 360 mL 8 PRN at PRN  . lactulose (CHRONULAC) 10 GM/15ML solution Take 30 mLs by mouth daily.   Past Month at Unknown time  . levothyroxine (SYNTHROID) 100 MCG tablet TAKE 1 TABLET DAILY (Patient taking differently: Take 100 mcg by mouth daily before breakfast. ) 90 tablet 2 11/15/2019 at 1000  . metoprolol tartrate (LOPRESSOR) 50 MG tablet Take 0.5 tablets (25 mg total) by mouth 2 (two) times daily.   11/15/2019 at 1000  . pantoprazole (PROTONIX) 40 MG tablet TAKE 1 TABLET TWICE A DAY (Patient taking differently: Take 40 mg by mouth daily. ) 180 tablet 3 11/15/2019 at 1000  . testosterone cypionate (DEPO-TESTOSTERONE) 200 MG/ML injection Inject 1 mL (200 mg total) into the muscle every 14 (fourteen) days. 6 mL 1 10/28/2019 at 1000  . torsemide (DEMADEX) 20 MG tablet Take 40 mg by mouth daily.   11/15/2019 at 1000  . ULORIC 80 MG TABS Take 80 mg by mouth daily.    11/15/2019 at 1000   Scheduled: . sodium chloride   Intravenous Once  . amiodarone  200 mg Oral Daily  . amitriptyline  25 mg Oral QHS  . vitamin C  1,000 mg Oral BID  . aspirin EC  81 mg Oral Daily  . atorvastatin  40 mg Oral Daily  . calcitRIOL  0.5 mcg Oral Daily  . calcium acetate  1,334 mg Oral TID WC  . Chlorhexidine Gluconate Cloth  6 each Topical Q0600  . doxycycline  100 mg Oral Q12H  . febuxostat  80 mg Oral Daily  . fluticasone furoate-vilanterol  1 puff Inhalation Daily  . furosemide  40 mg Oral Daily  . gentamicin cream  1 application Topical Daily  . hydrALAZINE  25 mg Oral TID  . insulin aspart  0-6 Units Subcutaneous TID WC  . insulin regular human CONCENTRATED  15 Units Subcutaneous BID WC  . ipratropium-albuterol  3 mL Inhalation Daily  . lactulose  10 g Oral Daily  . levothyroxine  100 mcg Oral Q0600  .  loratadine  10 mg Oral Daily  . metoprolol tartrate  25 mg Oral BID  . pantoprazole  40 mg Oral BID  . sodium chloride flush  3 mL Intravenous Q12H   Continuous: . sodium chloride 250 mL (11/18/19 1814)  . sodium chloride    . dialysis solution 2.5% low-MG/low-CA 0 mL (11/21/19 4010)   UVO:ZDGUYQ chloride, sodium chloride, acetaminophen **OR** acetaminophen, albuterol, alteplase, alum & mag hydroxide-simeth, heparin, HYDROcodone-acetaminophen, lidocaine (PF), lidocaine-prilocaine, morphine injection, ondansetron (ZOFRAN) IV, pentafluoroprop-tetrafluoroeth, sodium chloride flush, traZODone Anti-infectives (From admission, onward)   Start     Dose/Rate Route Frequency Ordered Stop   11/23/19 0000  doxycycline (VIBRA-TABS) 100 MG tablet     Discontinue     100 mg Oral Every 12 hours 11/23/19 1325 11/27/19 2359   11/16/19 1930  azithromycin (ZITHROMAX) 500 mg in sodium chloride 0.9 % 250 mL IVPB  Status:  Discontinued        500 mg 250 mL/hr over 60 Minutes Intravenous Every 24 hours 11/15/19 1931 11/16/19 1417   11/16/19 1900  cefTRIAXone (ROCEPHIN) 2 g in sodium chloride 0.9 % 100 mL IVPB        2 g 200 mL/hr over 30 Minutes Intravenous Every 24 hours 11/15/19 1933 11/20/19 0646   11/16/19 1800  doxycycline (  VIBRA-TABS) tablet 100 mg     Discontinue     100 mg Oral Every 12 hours 11/16/19 1417     11/15/19 2000  cefTRIAXone (ROCEPHIN) 1 g in sodium chloride 0.9 % 100 mL IVPB        1 g 200 mL/hr over 30 Minutes Intravenous  Once 11/15/19 1949 11/15/19 2200   11/15/19 1945  cefTRIAXone (ROCEPHIN) 2 g in sodium chloride 0.9 % 100 mL IVPB  Status:  Discontinued        2 g 200 mL/hr over 30 Minutes Intravenous Every 24 hours 11/15/19 1931 11/15/19 1945   11/15/19 1830  cefTRIAXone (ROCEPHIN) 1 g in sodium chloride 0.9 % 100 mL IVPB        1 g 200 mL/hr over 30 Minutes Intravenous  Once 11/15/19 1822 11/15/19 1921   11/15/19 1830  azithromycin (ZITHROMAX) 500 mg in sodium chloride 0.9 % 250  mL IVPB        500 mg 250 mL/hr over 60 Minutes Intravenous  Once 11/15/19 1822 11/15/19 2046     Scheduled Meds: . sodium chloride   Intravenous Once  . amiodarone  200 mg Oral Daily  . amitriptyline  25 mg Oral QHS  . vitamin C  1,000 mg Oral BID  . aspirin EC  81 mg Oral Daily  . atorvastatin  40 mg Oral Daily  . calcitRIOL  0.5 mcg Oral Daily  . calcium acetate  1,334 mg Oral TID WC  . Chlorhexidine Gluconate Cloth  6 each Topical Q0600  . doxycycline  100 mg Oral Q12H  . febuxostat  80 mg Oral Daily  . fluticasone furoate-vilanterol  1 puff Inhalation Daily  . furosemide  40 mg Oral Daily  . gentamicin cream  1 application Topical Daily  . hydrALAZINE  25 mg Oral TID  . insulin aspart  0-6 Units Subcutaneous TID WC  . insulin regular human CONCENTRATED  15 Units Subcutaneous BID WC  . ipratropium-albuterol  3 mL Inhalation Daily  . lactulose  10 g Oral Daily  . levothyroxine  100 mcg Oral Q0600  . loratadine  10 mg Oral Daily  . metoprolol tartrate  25 mg Oral BID  . pantoprazole  40 mg Oral BID  . sodium chloride flush  3 mL Intravenous Q12H   Continuous Infusions: . sodium chloride 250 mL (11/18/19 1814)  . sodium chloride    . dialysis solution 2.5% low-MG/low-CA 0 mL (11/21/19 0635)   PRN Meds:.sodium chloride, sodium chloride, acetaminophen **OR** acetaminophen, albuterol, alteplase, alum & mag hydroxide-simeth, heparin, HYDROcodone-acetaminophen, lidocaine (PF), lidocaine-prilocaine, morphine injection, ondansetron (ZOFRAN) IV, pentafluoroprop-tetrafluoroeth, sodium chloride flush, traZODone   Assessment: Principal Problem:   Respiratory failure with hypoxia (HCC) Active Problems:   Diabetes mellitus with neuropathy (HCC)   Hypertension   Hypothyroidism   Anemia, iron deficiency   Chronic diastolic CHF (congestive heart failure) (HCC)   COPD (chronic obstructive pulmonary disease) ([place])   ESRD on dialysis ([place])   CAP (community acquired  pneumonia)   Patrick Hurst a 64 y.o.malewith morbid obesity, diastolic heart failure, metabolic syndrome, A. fib on Eliquis, NASHcirrhosis ESRD on peritoneal dialysis admitted with hypoxic respiratory failure secondary to community-acquired pneumonia. He also presented with acute on chronic anemia.  Plan: Acute on chronic anemia: Patient responded appropriately to blood transfusion Hemoglobin is stable No evidence of active GI bleed, no witnessed episodes of melena, hematemesis, coffee-ground emesis or rectal bleeding Iron studies are consistent with anemia of chronic disease, no need of  oral iron. Guaiac stool positive result in inpatient setting does not carry any clinical significance,do not recommend this test as inpatient Patient is currently recovering from pneumonia and high risk for anesthesia to undergo endoscopic procedures especially he is not actively bleeding at this time Anemia is likely multifactorial, combination of infection, end-stage renal disease, anemia of chronic disease Okay to continue Protonix 40 mg p.o. twice daily Recommend close GI follow-up with outpatient evaluation after he recovers from pneumonia  Right hepatic lobe indeterminate solid lesion MRI liver without contrast did not confirm [place] AFP is normal  Compensated cirrhosislikely secondary to [place] in setting of metabolic syndrome Hepatitis B and C serologies negative, patient is immune to hepatitis A, HIV nonreactive Recommend EGD for variceal screening as outpatient Tight control of diabetes Low-sodium diet     LOS: 8 days   Raziel Koenigs 11/23/2019, 5:15 PM

## 2019-11-23 NOTE — Discharge Summary (Signed)
Physician Discharge Summary  AIJALON KIRTZ WEX:[id number] DOB: Feb 26, 1956 DOA: 11/15/2019  PCP: [name], MD  Admit date: 11/15/2019 Discharge date: 11/23/2019  Admitted From: Home Disposition: Home  Recommendations for Outpatient Follow-up:  1. Follow up with PCP in 1 week 2. Follow up with Pulmonology and Gastroenterology 3. Please follow up on the following pending results: None  Home Health: None Equipment/Devices: Oxygen  Discharge Condition: Stable CODE STATUS: Full code Diet recommendation: Renal diet   Brief/Interim Summary:  Admission HPI written by Para Skeans, MD   Chief Complaint:  Shortness of breath .  HPI: DEVLON DOSHER is a 64 y.o. male with medical history significant for diastolic  CHF, end-stage renal diseasecurrently on peritoneal dialysis, sleep apnea, atrial fibrillation, diabetes who presents with complaints of shortness of breath.He has been having to use nasal cannula oxygen over the last week, which has progressed since past few months. Occasionally he has had chills patient reports he was started on antibiotics by his PCP recently for possible pneumonia however no significant improvement. Does report mild cough. No nausea vomiting or diaphoresis. No chest pain. No sick contacts reported.Pt reports hemoptysis since yesterday and cough. Pt was hypoxic at 89% on ra on arrival and is over 90% on 2 L [place].   Hospital course:  Community acquired pneumonia Multifocal pneumonia RVP and COVID-19 negative. MRSA pcr negative. Pulmonology consulted for management. Patient managed on Ceftriaxone and doxycycline significantly improved prior to discharge. Pulmonology recommended to discharge on 4 more days of doxycycline.  Acute respiratory failure with hypoxia Secondary to above with concern for concomitant fluid overload from ESRD requiring PD and HD. Improving. On room air today but in low 90s at rest. Patient required oxygen with  ambulation and was set up for home oxygen. Can wean to room air as an outpatient. Recommend outpatient pulmonology follow-up.  Right hepatic lobe lesion Unsure of etiology. GI consulted. Concern for [place] but AFP undetectable. MRI abdomen significant for benign focal fatty deposition.  Cirrhosis of liver Secondary to NASH per GI in setting of metabolic syndrome. Negative for hepatitis B and C. GI recommended outpatient EGD, low sodium diet  Diabetes mellitus, type 2 Hemoglobin A1C of 5.8% and well controlled. History of diabetic retinopathy and peripheral edema. Continue home regimen.  Hypothyroidism Continue Synthroid  Acute on chronic diastolic heart failure Management with Lasix, HD an dPD. Transthoracic Echocardiogram significant for preserved EF and grade 2 diastolic dysfunction. Fluid status appears improved.  Anemia of chronic disease Patient required 1 unit of PRBC for a hemoglobin of 6.6 and is now stable  ESRD on PD/intermittent HD Patient underwent AV fistula creation by vascular surgery on 7/8. Site appears non-infected. PD while inpatient. Received one session of intermittent HD.  Right leg pain Unsure of etiology. Does not look significantly concerning for infectious etiology. Possibly reactive. Ultrasound of right leg without evidence of abscess and x-ray without evidence of fracture.  Paroxysmal atrial fibrillation Continue amiodarone and metoprolol  Acquired thrombophilia Previously on Eliquis for stroke prophylaxis which is held secondary to concern for GI bleed  COPD Stable.  Obesity Body mass index is 42.09 kg/m.  Discharge Diagnoses:  Principal Problem:   Respiratory failure with hypoxia ([place]) Active Problems:   Diabetes mellitus with neuropathy (HCC)   Hypertension   Hypothyroidism   Anemia, iron deficiency   Chronic diastolic CHF (congestive heart failure) (HCC)   COPD (chronic obstructive pulmonary disease) ([place])   ESRD on dialysis  ([place])  CAP (community acquired pneumonia)    Discharge Instructions  Discharge Instructions    Call MD for:  difficulty breathing, headache or visual disturbances   Complete by: As directed    Call MD for:  extreme fatigue   Complete by: As directed    Call MD for:  temperature >100.4   Complete by: As directed    Diet - low sodium heart healthy   Complete by: As directed    Increase activity slowly   Complete by: As directed    No wound care   Complete by: As directed      Allergies as of 11/23/2019      Reactions   Mucinex [guaifenesin Er] Swelling   Throat swelling, increases heart rate   Levaquin [levofloxacin] Palpitations      Medication List    TAKE these medications   albuterol 108 (90 Base) MCG/ACT inhaler Commonly known as: ProAir HFA Inhale 2 puffs into the lungs every 6 (six) hours as needed for wheezing or shortness of breath.   amiodarone 200 MG tablet Commonly known as: PACERONE Take 200 mg by mouth daily.   amitriptyline 25 MG tablet Commonly known as: ELAVIL TAKE 1 TABLET AT BEDTIME   amLODipine 5 MG tablet Commonly known as: NORVASC Take 1 tablet (5 mg total) by mouth daily.   atorvastatin 40 MG tablet Commonly known as: LIPITOR TAKE 1 TABLET DAILY   Breo Ellipta 100-25 MCG/INH Aepb Generic drug: fluticasone furoate-vilanterol Inhale 1 puff into the lungs daily.   calcitRIOL 0.5 MCG capsule Commonly known as: ROCALTROL Take 0.5 mcg by mouth daily.   cetirizine 10 MG tablet Commonly known as: ZYRTEC Take 10 mg by mouth daily.   doxycycline 100 MG tablet Commonly known as: VIBRA-TABS Take 1 tablet (100 mg total) by mouth every 12 (twelve) hours for 4 days. What changed: when to take this   Eliquis 2.5 MG Tabs tablet Generic drug: apixaban Take 2.5 mg by mouth 2 (two) times daily.   gentamicin cream 0.1 % Commonly known as: GARAMYCIN Apply 1 application topically daily.   HUMULIN R 500 UNIT/ML injection Generic drug: insulin  regular human CONCENTRATED Inject up to 25 units twice a day, or as directed by physician What changed:   how much to take  how to take this  when to take this  additional instructions   hydrALAZINE 25 MG tablet Commonly known as: APRESOLINE TAKE 1 TABLET THREE TIMES A DAY   ipratropium-albuterol 0.5-2.5 (3) MG/3ML Soln Commonly known as: DUONEB INHALE 3 ML BY NEBULIZATION EVERY 4 HOURS AS NEEDED What changed: See the new instructions.   lactulose 10 GM/15ML solution Commonly known as: CHRONULAC Take 30 mLs by mouth daily.   levothyroxine 100 MCG tablet Commonly known as: SYNTHROID TAKE 1 TABLET DAILY What changed: when to take this   metoprolol tartrate 50 MG tablet Commonly known as: LOPRESSOR Take 0.5 tablets (25 mg total) by mouth 2 (two) times daily.   pantoprazole 40 MG tablet Commonly known as: PROTONIX TAKE 1 TABLET TWICE A DAY What changed: when to take this   testosterone cypionate 200 MG/ML injection Commonly known as: Depo-Testosterone Inject 1 mL (200 mg total) into the muscle every 14 (fourteen) days.   torsemide 20 MG tablet Commonly known as: DEMADEX Take 40 mg by mouth daily.   Uloric 80 MG Tabs Generic drug: Febuxostat Take 80 mg by mouth daily.   vitamin C 1000 MG tablet Take 1,000 mg by mouth 2 (two) times daily.  Durable Medical Equipment  (From admission, onward)         Start     Ordered   11/23/19 1317  For home use only DME oxygen  Once       Question Answer Comment  Length of Need 6 Months   Mode or (Route) Nasal cannula   Liters per Minute 2   Frequency Continuous (stationary and portable oxygen unit needed)   Oxygen delivery system Gas      11/23/19 1316   11/23/19 1018  For home use only DME Walker rolling  Once       Question Answer Comment  Walker: With 5 Inch Wheels   Patient needs a walker to treat with the following condition Community acquired pneumonia      11/23/19 1017           Follow-up Information    Fisher, Kirstie Peri, MD. Schedule an appointment as soon as possible for a visit in 1 week(s).   Specialty: Family Medicine Why: Hospital follow-up Contact information: [address] [place] [address] [phone]        Chesley Mires, MD Follow up.   Specialty: Pulmonary Disease Why: COPD exacerbation Hypoxia and oxygen requirement Contact information: [address] [place] [address] [phone]              Allergies  Allergen Reactions  . Mucinex [Guaifenesin Er] Swelling    Throat swelling, increases heart rate  . Levaquin [Levofloxacin] Palpitations    Consultations:  Nephrology  Gastroenterology  Pulmonology   Procedures/Studies: DG Chest 2 View  Result Date: 11/18/2019 CLINICAL DATA:  Acute respiratory failure with hypoxia. EXAM: CHEST - 2 VIEW COMPARISON:  November 15, 2019. FINDINGS: The heart size and mediastinal contours are within normal limits. No pneumothorax or pleural effusion is noted. Right internal jugular dialysis catheter is unchanged in position. Bilateral perihilar opacities are noted, left greater than right, consistent with pneumonia. The visualized skeletal structures are unremarkable. IMPRESSION: Grossly stable bilateral perihilar opacities, left greater than right, consistent with pneumonia. Electronically Signed   By: Marijo Conception M.D.   On: 11/18/2019 16:11   DG Chest 2 View  Result Date: 11/15/2019 CLINICAL DATA:  Short of breath, pneumonia EXAM: CHEST - 2 VIEW COMPARISON:  11/12/2019 FINDINGS: Frontal and lateral views of the chest demonstrate persistent bilateral perihilar airspace disease, slightly improved on the right since prior study but with significant progression on the left. There is no effusion or pneumothorax. Cardiac silhouette is enlarged but stable. Stable right internal jugular dialysis catheter. No acute bony abnormalities. IMPRESSION: 1. Bilateral perihilar  airspace disease, left greater than right. There has been progression in the left since prior study. Differential includes bilateral pneumonia versus asymmetric edema. Electronically Signed   By: Randa Ngo M.D.   On: 11/15/2019 15:37   DG Chest 2 View  Result Date: 11/12/2019 CLINICAL DATA:  Shortness of breath for 4-5 days. EXAM: CHEST - 2 VIEW COMPARISON:  Single-view of the chest 03/07/2019. FINDINGS: Patchy airspace disease in the mid and lower lung zones bilaterally is worse on the left. Heart size is normal. No pneumothorax or pleural effusion. Right IJ approach dialysis catheter noted. IMPRESSION: Patchy bilateral airspace disease consistent with pneumonia. Electronically Signed   By: Inge Rise M.D.   On: 11/12/2019 15:34   DG Ankle Complete Right  Result Date: 11/23/2019 CLINICAL DATA:  Right ankle pain for several days, no known injury, initial encounter EXAM: RIGHT  ANKLE - COMPLETE 3+ VIEW COMPARISON:  10/09/2015 FINDINGS: No acute fracture or dislocation is noted. Considerable degenerative changes are noted in the tarsal bones. Associated soft tissue swelling noted. IMPRESSION: Tarsal degenerative change without acute abnormality. Electronically Signed   By: Inez Catalina M.D.   On: 11/23/2019 11:52   CT CHEST WO CONTRAST  Result Date: 11/19/2019 CLINICAL DATA:  Dyspnea, chronic, diastolic heart failure, end-stage renal disease EXAM: CT CHEST WITHOUT CONTRAST TECHNIQUE: Multidetector CT imaging of the chest was performed following the standard protocol without IV contrast. COMPARISON:  06/19/2019 FINDINGS: Cardiovascular: Extensive coronary artery calcification is noted, predominantly within the left main and left anterior descending coronary artery. Cardiac size is at the upper limits of normal. No pericardial effusion. Central pulmonary arteries are enlarged in keeping with changes of pulmonary arterial hypertension. The thoracic aorta is of normal caliber. Right internal jugular  central venous catheter tip is noted within the right atrium. Mediastinum/Nodes: No pathologic thoracic adenopathy. Lungs/Pleura: Since the prior examination, there has developed extensive bilateral asymmetric airspace infiltrates, more severe within the superior segment of the left lower lobe, likely infectious or inflammatory in the acute setting. No superimposed significant pulmonary edema. No pneumothorax or pleural effusion. The central airways are widely patent. Upper Abdomen: Cholecystectomy has been performed. Since the prior examination, there has developed a multilobulated low-attenuation lesion within segment 4 B of the liver which is indeterminate. This may represent a developing hepatic mass or complex fluid collection such as a hepatic abscess. This measures roughly 4.1 x 6.9 cm on axial image # 134/2. A trace amount of perihepatic ascites has developed. Musculoskeletal: No lytic or blastic bone lesion is identified. IMPRESSION: Interval development of multifocal pulmonary infiltrates, most in keeping with atypical infection in the acute setting. Interval development of a multilobulated indeterminate lesion within the right hepatic lobe. This would be better assessed, at least initially, with dedicated hepatics sonography or contrast enhanced CT examination. Coronary artery calcification Aortic Atherosclerosis (ICD10-I70.0). Electronically Signed   By: Fidela Salisbury MD   On: 11/19/2019 19:36   MR ABDOMEN WO CONTRAST  Result Date: 11/23/2019 CLINICAL DATA:  Evaluate indeterminate liver lesion EXAM: MRI ABDOMEN WITHOUT CONTRAST TECHNIQUE: Multiplanar multisequence MR imaging was performed without the administration of intravenous contrast. COMPARISON:  Ultrasound abdomen 11/20/2019 FINDINGS: Lower chest: No acute abnormality. Hepatobiliary: Within segment 4B and segment 5, adjacent to the gallbladder fossa, there are several areas loss of signal on the out of phase sequence which correspond to the  focal areas of low attenuation seen on CT from 11/19/2019 and ultrasound from 11/20/2019. This is consistent with benign focal fatty deposition. Cholecystectomy. No significant bile duct dilatation. Pancreas: No mass, inflammatory changes, or other parenchymal abnormality identified. Spleen: The spleen measures 15.3 by 12.7 x 6.4 cm (volume = 650 cm^3). No focal splenic lesion identified. Adrenals/Urinary Tract: Normal appearance of the adrenal glands. Bilateral kidney cysts are noted. No hydronephrosis. Stomach/Bowel: Visualized portions within the abdomen are unremarkable. Vascular/Lymphatic: No pathologically enlarged lymph nodes identified. No abdominal aortic aneurysm demonstrated. Other:  No free fluid or fluid collections Musculoskeletal: No suspicious bone lesions identified. IMPRESSION: 1. There are several areas of loss of signal on the out of phase sequence which correspond to the focal areas of low attenuation seen on CT from 11/19/2019 and ultrasound from 11/20/2019. This is consistent with benign focal fatty deposition. Within the limitations of unenhanced technique there are no suspicious liver lesions. 2. Splenomegaly. 3. Bilateral kidney cysts. Electronically Signed   By: Lovena Le  Clovis Riley M.D.   On: 11/23/2019 07:25   DG Chest Port 1 View  Result Date: 11/21/2019 CLINICAL DATA:  Abnormal chest radiograph EXAM: PORTABLE CHEST 1 VIEW COMPARISON:  CT 11/19/2019, radiograph 11/18/2019 FINDINGS: Dual lumen right IJ approach dialysis catheter tip terminates at the superior cavoatrial junction in stable position from prior. Telemetry leads overlie the chest. Redemonstration of the bilateral patchy airspace opacities most pronounced in the left mid to lower lung and right lung base. Stable bandlike opacity in the right mid lung likely reflecting some subsegmental atelectasis and/or scarring as was present on comparison CT. No pneumothorax or visible effusion. Cardiomediastinal contours are stable. No  acute osseous or soft tissue abnormality. Degenerative changes are present in the imaged spine and shoulders. IMPRESSION: Persistent bilateral patchy airspace opacities, perhaps mildly improved in the left mid lung but with accentuation in the bases likely related to diminished volumes. Stable satisfactory position of a right IJ approach dual lumen central venous catheter. Electronically Signed   By: Lovena Le M.D.   On: 11/21/2019 18:35   VAS [place] UPPER EXTREMITY ARTERIAL DUPLEX  Result Date: 11/09/2019 UPPER EXTREMITY DUPLEX STUDY Performing Technologist: Concha Norway RVT  Examination Guidelines: A complete evaluation includes B-mode imaging, spectral Doppler, color Doppler, and power Doppler as needed of all accessible portions of each vessel. Bilateral testing is considered an integral part of a complete examination. Limited examinations for reoccurring indications may be performed as noted.  Right Pre-Dialysis Findings: +-----------------------+----------+--------------------+---------+--------+ Location               PSV (cm/s)Intralum. Diam. (cm)Waveform Comments +-----------------------+----------+--------------------+---------+--------+ Brachial Antecub. fossa120       0.61                triphasic         +-----------------------+----------+--------------------+---------+--------+ Radial Art at Wrist    92        0.25                triphasic         +-----------------------+----------+--------------------+---------+--------+ Ulnar Art at Wrist     119       0.20                triphasic         +-----------------------+----------+--------------------+---------+--------+ Left Pre-Dialysis Findings: +-----------------------+----------+--------------------+---------+--------+ Location               PSV (cm/s)Intralum. Diam. (cm)Waveform Comments +-----------------------+----------+--------------------+---------+--------+ Brachial Antecub. fossa130       0.66                 triphasic         +-----------------------+----------+--------------------+---------+--------+ Radial Art at Wrist    94        0.33                triphasic         +-----------------------+----------+--------------------+---------+--------+ Ulnar Art at Wrist     131       0.22                triphasic         +-----------------------+----------+--------------------+---------+--------+  Summary:  Right: No obstruction visualized in the right upper extremity. Left: No obstruction visualized in the left upper extremity. *See table(s) above for measurements and observations. Electronically signed by Leotis Pain MD on 11/09/2019 at 9:02:37 AM.    Final    ECHOCARDIOGRAM COMPLETE  Result Date: 11/16/2019    ECHOCARDIOGRAM REPORT   Patient Name:   LEMONT SITZMANN Date  of Exam: 11/16/2019 Medical Rec #:  [id number]      Height:       71.0 in Accession #:    [id number]     Weight:       308.6 lb Date of Birth:  [birth year]      BSA:          2.536 m Patient Age:    12 years       BP:           128/55 mmHg Patient Gender: M              HR:           74 bpm. Exam Location:  [facility] Procedure: 2D Echo, Cardiac Doppler and Color Doppler Indications:     Dyspnea 786.09  History:         Patient has prior history of Echocardiogram examinations, most                  recent 03/06/2019. CHF; Risk Factors:Sleep Apnea.  Sonographer:     Sherrie Sport RDCS (AE) Referring Phys:  Theola Sequin Diagnosing Phys: Ida Rogue MD IMPRESSIONS  1. Left ventricular ejection fraction, by estimation, is 60 to 65%. The left ventricle has normal function. The left ventricle has no regional wall motion abnormalities. There is mild left ventricular hypertrophy. Left ventricular diastolic parameters are consistent with Grade II diastolic dysfunction (pseudonormalization).  2. Right ventricular systolic function is normal. The right ventricular size is normal. There is normal pulmonary artery systolic pressure. The estimated right  ventricular systolic pressure is 37.9 mmHg.  3. The aortic valve is moderately calcified. Mild to moderate aortic valve sclerosis/calcification is present, without any evidence of aortic stenosis.  4. Left atrial size was mildly dilated. FINDINGS  Left Ventricle: Left ventricular ejection fraction, by estimation, is 60 to 65%. The left ventricle has normal function. The left ventricle has no regional wall motion abnormalities. The left ventricular internal cavity size was normal in size. There is  mild left ventricular hypertrophy. Left ventricular diastolic parameters are consistent with Grade II diastolic dysfunction (pseudonormalization). Right Ventricle: The right ventricular size is normal. No increase in right ventricular wall thickness. Right ventricular systolic function is normal. There is normal pulmonary artery systolic pressure. The tricuspid regurgitant velocity is 1.97 m/s, and  with an assumed right atrial pressure of 10 mmHg, the estimated right ventricular systolic pressure is 02.4 mmHg. Left Atrium: Left atrial size was mildly dilated. Right Atrium: Right atrial size was normal in size. Pericardium: There is no evidence of pericardial effusion. Mitral Valve: The mitral valve is normal in structure. Normal mobility of the mitral valve leaflets. Mild mitral annular calcification. Mild mitral valve regurgitation. No evidence of mitral valve stenosis. Tricuspid Valve: The tricuspid valve is normal in structure. Tricuspid valve regurgitation is not demonstrated. No evidence of tricuspid stenosis. Aortic Valve: The aortic valve is normal in structure. Aortic valve regurgitation is not visualized. Mild to moderate aortic valve sclerosis/calcification is present, without any evidence of aortic stenosis. Aortic valve mean gradient measures 8.5 mmHg. Aortic valve peak gradient measures 16.6 mmHg. Aortic valve area, by VTI measures 2.29 cm. Pulmonic Valve: The pulmonic valve was normal in structure. Pulmonic  valve regurgitation is not visualized. No evidence of pulmonic stenosis. Aorta: The aortic root is normal in size and structure. Venous: The inferior vena cava is normal in size with greater than 50% respiratory variability, suggesting right atrial pressure of 3 mmHg. IAS/Shunts: No  atrial level shunt detected by color flow Doppler.  LEFT VENTRICLE PLAX 2D LVIDd:         6.18 cm  Diastology LVIDs:         3.40 cm  LV e' lateral:   14.70 cm/s LV PW:         1.54 cm  LV E/e' lateral: 7.7 LV IVS:        0.93 cm  LV e' medial:    5.44 cm/s LVOT diam:     2.30 cm  LV E/e' medial:  20.8 LV SV:         97 LV SV Index:   38 LVOT Area:     4.15 cm  RIGHT VENTRICLE RV Basal diam:  3.25 cm RV S prime:     18.40 cm/s TAPSE (M-mode): 3.7 cm LEFT ATRIUM            Index       RIGHT ATRIUM           Index LA diam:      5.20 cm  2.05 cm/m  RA Area:     25.00 cm LA Vol (A2C): 187.0 ml 73.75 ml/m RA Volume:   79.50 ml  31.35 ml/m LA Vol (A4C): 112.0 ml 44.17 ml/m  AORTIC VALVE                    PULMONIC VALVE AV Area (Vmax):    2.10 cm     PV Vmax:        1.01 m/s AV Area (Vmean):   2.07 cm     PV Peak grad:   4.1 mmHg AV Area (VTI):     2.29 cm     RVOT Peak grad: 8 mmHg AV Vmax:           203.50 cm/s AV Vmean:          137.500 cm/s AV VTI:            0.422 m AV Peak Grad:      16.6 mmHg AV Mean Grad:      8.5 mmHg LVOT Vmax:         103.00 cm/s LVOT Vmean:        68.500 cm/s LVOT VTI:          0.233 m LVOT/AV VTI ratio: 0.55  AORTA Ao Root diam: 3.70 cm MITRAL VALVE                TRICUSPID VALVE MV Area (PHT): 3.20 cm     TR Peak grad:   15.5 mmHg MV Decel Time: 237 msec     TR Vmax:        197.00 cm/s MV E velocity: 113.00 cm/s MV A velocity: 100.00 cm/s  SHUNTS MV E/A ratio:  1.13         Systemic VTI:  0.23 m                             Systemic Diam: 2.30 cm Ida Rogue MD Electronically signed by Ida Rogue MD Signature Date/Time: 11/16/2019/2:31:17 PM    Final    [place] RT LOWER EXTREM LTD SOFT TISSUE NON  VASCULAR  Result Date: 11/22/2019 CLINICAL DATA:  Fluctuant mass on the medial aspect of the right leg EXAM: ULTRASOUND RIGHT LOWER EXTREMITY LIMITED TECHNIQUE: Ultrasound examination of the lower extremity soft tissues was performed in the area of clinical concern. COMPARISON:  None. FINDINGS: The patient's palpable area of concern corresponds to an area of subcutaneous edema. There is no well-formed fluid collection or abscess. There may be some mild overlying skin thickening. There is no mass. IMPRESSION: Lower extremity edema without evidence for an abscess, fluid collection, or mass. Electronically Signed   By: Constance Holster M.D.   On: 11/22/2019 19:13   VAS [place] UPPER EXT VEIN MAPPING (PRE-OP AVF)  Result Date: 11/09/2019 UPPER EXTREMITY VEIN MAPPING  Indications: Pre-access. History: Esrd.  Performing Technologist: Concha [place] RVT  Examination Guidelines: A complete evaluation includes B-mode imaging, spectral Doppler, color Doppler, and power Doppler as needed of all accessible portions of each vessel. Bilateral testing is considered an integral part of a complete examination. Limited examinations for reoccurring indications may be performed as noted. +-----------------+-------------+----------+---------+ Right Cephalic   Diameter (cm)Depth (cm)Findings  +-----------------+-------------+----------+---------+ Prox upper arm       0.54                         +-----------------+-------------+----------+---------+ Mid upper arm        0.56               branching +-----------------+-------------+----------+---------+ Dist upper arm       0.52                         +-----------------+-------------+----------+---------+ Antecubital fossa    0.56                         +-----------------+-------------+----------+---------+ Prox forearm         0.82                         +-----------------+-------------+----------+---------+ Mid forearm          0.47                branching +-----------------+-------------+----------+---------+ Dist forearm         0.38                         +-----------------+-------------+----------+---------+ +-----------------+-------------+----------+--------+ Right Basilic    Diameter (cm)Depth (cm)Findings +-----------------+-------------+----------+--------+ Prox upper arm       0.72                        +-----------------+-------------+----------+--------+ Mid upper arm        0.45                        +-----------------+-------------+----------+--------+ Dist upper arm       0.45                        +-----------------+-------------+----------+--------+ Antecubital fossa    0.44                        +-----------------+-------------+----------+--------+ Prox forearm         0.43                        +-----------------+-------------+----------+--------+ +-----------------+-------------+----------+--------+ Left Cephalic    Diameter (cm)Depth (cm)Findings +-----------------+-------------+----------+--------+ Prox upper arm       0.60                        +-----------------+-------------+----------+--------+ Mid upper  arm        0.50                        +-----------------+-------------+----------+--------+ Dist upper arm       0.48                        +-----------------+-------------+----------+--------+ Antecubital fossa    0.73                        +-----------------+-------------+----------+--------+ Prox forearm         0.44                        +-----------------+-------------+----------+--------+ Mid forearm          0.49                        +-----------------+-------------+----------+--------+ Dist forearm         0.41                        +-----------------+-------------+----------+--------+ +-----------------+-------------+----------+--------+ Left Basilic     Diameter (cm)Depth (cm)Findings  +-----------------+-------------+----------+--------+ Prox upper arm       0.64                        +-----------------+-------------+----------+--------+ Mid upper arm        0.57                        +-----------------+-------------+----------+--------+ Dist upper arm       0.67                        +-----------------+-------------+----------+--------+ Antecubital fossa    0.62                        +-----------------+-------------+----------+--------+ Prox forearm         0.43                        +-----------------+-------------+----------+--------+ Summary: Right: Normal caliber and compressible veins throughout. Left: Normal caliber and compressible veins throughout. *See table(s) above for measurements and observations.  Diagnosing physician: Leotis Pain MD Electronically signed by Leotis Pain MD on 11/09/2019 at 9:02:34 AM.    Final    US Abdomen Limited RUQ  Result Date: 11/20/2019 CLINICAL DATA:  Liver lesion. EXAM: ULTRASOUND ABDOMEN LIMITED RIGHT UPPER QUADRANT COMPARISON:  Chest CT 11/19/2019. Right upper quadrant abdominal ultrasound 05/08/2016. FINDINGS: Gallbladder: Surgically absent. Common bile duct: Diameter: 4 mm Liver: Background increased parenchymal echogenicity diffusely. Approximately 7.8 x 5.3 x 7.6 cm lesion anteriorly in the liver with heterogeneous posterior shadowing and a small amount of internal vascularity. The lesion demonstrates heterogeneous echogenicity but is primarily hyperechoic. Portal vein is patent on color Doppler imaging with normal direction of blood flow towards the liver. Other: None. IMPRESSION: Indeterminate solid liver mass. When the patient is clinically stable and able to follow directions and hold their breath (preferably as an outpatient), further evaluation with a contrast-enhanced abdominal MRI is recommended. Electronically Signed   By: Logan Bores M.D.   On: 11/20/2019 11:53     Community acquired pneumonia Multifocal  pneumonia RVP and COVID-19 negative. MRSA pcr negative. Pulmonology consulted for management. Patient managed on Ceftriaxone and  doxycycline and is improving clinically. -Continue Ceftriaxone/Doxycycline  Acute respiratory failure with hypoxia Secondary to above with concern for concomitant fluid overload from ESRD requiring PD and HD. Improving. On room air today but in low 90s at rest. -Continue to wean oxygen -Ambulate with pulse oximetry tomorrow  Right hepatic lobe lesion Unsure of etiology. GI consulted. Concern for [place] but AFP undetectable. 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 Negative for hepatitis B and C. -GI recommendations: outpatient EGD, low sodium diet  Diabetes mellitus, type 2 Hemoglobin A1C of 5.8% and well controlled. History of diabetic retinopathy and peripheral edema. -Continue SSI  Hypothyroidism -Continue Synthroid  Acute on chronic diastolic heart failure Management with Lasix, HD an dPD. Transthoracic Echocardiogram significant for preserved EF and grade 2 diastolic dysfunction. Fluid status appears improved.  Anemia of chronic disease Patient required 1 unit of PRBC for a hemoglobin of 6.6 and is now stable  ESRD on PD/intermittent HD Patient underwent AV fistula creation by vascular surgery on 7/8. Site appears non-infected.  -Nephrology recommendations: PD nightly  Right leg mass Unsure of etiology. Does not look significantly concerning for infectious etiology. Possibly reactive -Ultrasound of right leg  Paroxysmal atrial fibrillation -Continue amiodarone and metoprolol  Acquired thrombophilia Previously on Eliquis for stroke prophylaxis which is held secondary to concern for GI bleed  COPD Stable.  Obesity Body mass index is 42.09 kg/m.   Subjective: Breathing better. Still some pain of right leg.  Discharge Exam: Vitals:   11/23/19 0825  11/23/19 1248  BP: (!) 153/71 (!) 163/71  Pulse: 68 70  Resp: 18 16  Temp: (!) 97.5 F (36.4 C) 98.2 F (36.8 C)  SpO2: 99% 94%   Vitals:   11/22/19 2342 11/23/19 0500 11/23/19 0825 11/23/19 1248  BP: (!) 143/67 (!) 140/65 (!) 153/71 (!) 163/71  Pulse: 70  68 70  Resp: 20  18 16   Temp: 98 F (36.7 C) 98.4 F (36.9 C) (!) 97.5 F (36.4 C) 98.2 F (36.8 C)  TempSrc:   Oral   SpO2: 97%  99% 94%  Weight:      Height:        General: Pt is alert, awake, not in acute distress Cardiovascular: RRR, S1/S2 +, no rubs, no gallops Respiratory: Diminished. CTA bilaterally, no wheezing, no rhonchi Abdominal: Soft, NT, ND, bowel sounds + Extremities: no edema, no cyanosis    The results of significant diagnostics from this hospitalization (including imaging, microbiology, ancillary and laboratory) are listed below for reference.     Microbiology: Recent Results (from the past 240 hour(s))  Blood Culture (routine x 2)     Status: None   Collection Time: 11/15/19  5:28 PM   Specimen: BLOOD  Result Value Ref Range Status   Specimen Description BLOOD BLOOD RIGHT FOREARM  Final   Special Requests   Final    BOTTLES DRAWN AEROBIC AND ANAEROBIC Blood Culture adequate volume   Culture   Final    NO GROWTH 5 DAYS Performed at [facility], [address] [place], [place] [address]    Report Status 11/20/2019 FINAL  Final  Blood Culture (routine x 2)     Status: None   Collection Time: 11/15/19  5:28 PM   Specimen: BLOOD  Result Value Ref Range Status   Specimen Description BLOOD RIGHT ANTECUBITAL  Final   Special Requests   Final    BOTTLES DRAWN AEROBIC AND ANAEROBIC Blood  Culture results may not be optimal due to an inadequate volume of blood received in culture bottles   Culture   Final    NO GROWTH 5 DAYS Performed at [facility], [address] [place], [place] [address]    Report Status 11/20/2019 FINAL  Final  SARS Coronavirus 2 by RT PCR (hospital  order, performed in [facility] hospital lab) Nasopharyngeal Nasopharyngeal Swab     Status: None   Collection Time: 11/15/19  7:14 PM   Specimen: Nasopharyngeal Swab  Result Value Ref Range Status   SARS Coronavirus 2 NEGATIVE NEGATIVE Final    Comment: (NOTE) SARS-CoV-2 target nucleic acids are NOT DETECTED.  The SARS-CoV-2 RNA is generally detectable in upper and lower respiratory specimens during the acute phase of infection. The lowest concentration of SARS-CoV-2 viral copies this assay can detect is 250 copies / mL. A negative result does not preclude SARS-CoV-2 infection and should not be used as the sole basis for treatment or other patient management decisions.  A negative result may occur with improper specimen collection / handling, submission of specimen other than nasopharyngeal swab, presence of viral mutation(s) within the areas targeted by this assay, and inadequate number of viral copies (<250 copies / mL). A negative result must be combined with clinical observations, patient history, and epidemiological information.  Fact Sheet for Patients:   StrictlyIdeas.no  Fact Sheet for Healthcare Providers: BankingDealers.co.za  This test is not yet approved or  cleared by the [place] FDA and has been authorized for detection and/or diagnosis of SARS-CoV-2 by FDA under an Emergency Use Authorization (EUA).  This EUA will remain in effect (meaning this test can be used) for the duration of the COVID-19 declaration under Section 564(b)(1) of the Act, 21 U.S.C. section 360bbb-3(b)(1), unless the authorization is terminated or revoked sooner.  Performed at [facility], [address] [place], [place] [address]   Expectorated sputum assessment w rflx to resp cult     Status: None   Collection Time: 11/19/19  6:00 AM   Specimen: Sputum  Result Value Ref Range Status   Specimen Description SPUTUM  Final    Special Requests NONE  Final   Sputum evaluation   Final    Sputum specimen not acceptable for testing.  Please recollect.   NOTIFIED VICTORIA BADGETT AT 0822 OF NEED FOR RECOLLECT. [facility] Performed at [facility], [address] [place], [place] [address]    Report Status 11/20/2019 FINAL  Final  Respiratory Panel by PCR     Status: None   Collection Time: 11/19/19 12:57 PM   Specimen: Nasopharyngeal Swab; Respiratory  Result Value Ref Range Status   Adenovirus NOT DETECTED NOT DETECTED Final   Coronavirus 229E NOT DETECTED NOT DETECTED Final    Comment: (NOTE) The Coronavirus on the Respiratory Panel, DOES NOT test for the novel  Coronavirus (2019 nCoV)    Coronavirus HKU1 NOT DETECTED NOT DETECTED Final   Coronavirus NL63 NOT DETECTED NOT DETECTED Final   Coronavirus OC43 NOT DETECTED NOT DETECTED Final   Metapneumovirus NOT DETECTED NOT DETECTED Final   Rhinovirus / Enterovirus NOT DETECTED NOT DETECTED Final   Influenza A NOT DETECTED NOT DETECTED Final   Influenza B NOT DETECTED NOT DETECTED Final   Parainfluenza Virus 1 NOT DETECTED NOT DETECTED Final   Parainfluenza Virus 2 NOT DETECTED NOT DETECTED Final   Parainfluenza Virus 3 NOT DETECTED NOT DETECTED Final   Parainfluenza Virus 4 NOT DETECTED NOT DETECTED Final  Respiratory Syncytial Virus NOT DETECTED NOT DETECTED Final   Bordetella pertussis NOT DETECTED NOT DETECTED Final   Chlamydophila pneumoniae NOT DETECTED NOT DETECTED Final   Mycoplasma pneumoniae NOT DETECTED NOT DETECTED Final    Comment: Performed at [facility], [place], [place], [place] [address]  MRSA PCR Screening     Status: None   Collection Time: 11/19/19 12:57 PM   Specimen: Nasopharyngeal  Result Value Ref Range Status   MRSA by PCR NEGATIVE NEGATIVE Final    Comment:        The GeneXpert MRSA Assay (FDA approved for NASAL specimens only), is one component of a comprehensive MRSA colonization surveillance program. It  is not intended to diagnose MRSA infection nor to guide or monitor treatment for MRSA infections. Performed at [facility], [address] [place], [place] [address]   Expectorated sputum assessment w rflx to resp cult     Status: None   Collection Time: 11/19/19 12:58 PM   Specimen: Sputum  Result Value Ref Range Status   Specimen Description SPUTUM  Final   Special Requests SPUTUM  Final   Sputum evaluation   Final    Sputum specimen not acceptable for testing.  Please recollect.   Early Osmond NOTIFIED AT 1417 ON 11/19/19 SNG Performed at [facility], [address] [place], [place] [address]    Report Status 11/19/2019 FINAL  Final     Labs: BNP (last 3 results) Recent Labs    03/04/19 1724 11/15/19 1728  BNP 315.0* 629.5*   Basic Metabolic Panel: Recent Labs  Lab 11/17/19 0315 11/17/19 2035 11/18/19 0357 11/19/19 0843 11/20/19 0959 11/21/19 0439  NA 138  --  136 137 136 139  K 4.2  --  4.0 4.5 4.7 4.3  CL 95*  --  94* 94* 94* 96*  CO2 27  --  24 25 26 25   GLUCOSE 139*  --  169* 243* 274* 257*  BUN 77*  --  89* 91* 92* 96*  CREATININE 8.22*  --  9.38* 9.57* 10.08* 10.23*  CALCIUM 8.4*  --  8.3* 9.1 8.6* 9.7  PHOS  --  9.6*  --   --   --   --    Liver Function Tests: Recent Labs  Lab 11/19/19 0843  AST 22  ALT 30  ALKPHOS 250*  BILITOT 0.7  PROT 7.9  ALBUMIN 3.3*   No results for input(s): LIPASE, AMYLASE in the last 168 hours. No results for input(s): AMMONIA in the last 168 hours. CBC: Recent Labs  Lab 11/17/19 0315 11/18/19 0357 11/19/19 0843 11/21/19 0439  WBC 7.3 7.8 6.3 7.7  NEUTROABS  --   --   --  5.6  HGB 7.3* 7.5* 8.0* 8.1*  HCT 21.6* 22.0* 24.0* 24.2*  MCV 90.8 89.4 91.6 91.0  PLT 138* 129* 125* 124*   Cardiac Enzymes: No results for input(s): CKTOTAL, CKMB, CKMBINDEX, TROPONINI in the last 168 hours. BNP: Invalid input(s): POCBNP CBG: Recent Labs  Lab 11/22/19 0830 11/22/19 1219 11/22/19 1722  11/22/19 2001 11/23/19 0826  GLUCAP 204* 205* 344* 180* 161*   D-Dimer No results for input(s): DDIMER in the last 72 hours. Hgb A1c No results for input(s): HGBA1C in the last 72 hours. Lipid Profile No results for input(s): CHOL, HDL, LDLCALC, TRIG, CHOLHDL, LDLDIRECT in the last 72 hours. Thyroid function studies No results for input(s): TSH, T4TOTAL, T3FREE, THYROIDAB in the last 72 hours.  Invalid input(s): FREET3 Anemia  work up No results for input(s): VITAMINB12, FOLATE, FERRITIN, TIBC, IRON, RETICCTPCT in the last 72 hours. Urinalysis    Component Value Date/Time   COLORURINE STRAW (A) 11/19/2017 1437   APPEARANCEUR CLEAR (A) 11/19/2017 1437   APPEARANCEUR Clear 03/13/2012 1011   LABSPEC 1.008 11/19/2017 1437   LABSPEC 1.015 03/13/2012 1011   PHURINE 5.0 11/19/2017 1437   GLUCOSEU NEGATIVE 11/19/2017 1437   GLUCOSEU Negative 03/13/2012 [place] 11/19/2017 1437   BILIRUBINUR NEGATIVE 11/19/2017 1437   BILIRUBINUR Negative 03/13/2012 1011   KETONESUR NEGATIVE 11/19/2017 1437   PROTEINUR 30 (A) 11/19/2017 1437   NITRITE NEGATIVE 11/19/2017 1437   LEUKOCYTESUR NEGATIVE 11/19/2017 1437   LEUKOCYTESUR Negative 03/13/2012 1011   Sepsis Labs Invalid input(s): PROCALCITONIN,  WBC,  LACTICIDVEN Microbiology Recent Results (from the past 240 hour(s))  Blood Culture (routine x 2)     Status: None   Collection Time: 11/15/19  5:28 PM   Specimen: BLOOD  Result Value Ref Range Status   Specimen Description BLOOD BLOOD RIGHT FOREARM  Final   Special Requests   Final    BOTTLES DRAWN AEROBIC AND ANAEROBIC Blood Culture adequate volume   Culture   Final    NO GROWTH 5 DAYS Performed at [facility], [address] [place], [place] [address]    Report Status 11/20/2019 FINAL  Final  Blood Culture (routine x 2)     Status: None   Collection Time: 11/15/19  5:28 PM   Specimen: BLOOD  Result Value Ref Range Status   Specimen Description BLOOD RIGHT  ANTECUBITAL  Final   Special Requests   Final    BOTTLES DRAWN AEROBIC AND ANAEROBIC Blood Culture results may not be optimal due to an inadequate volume of blood received in culture bottles   Culture   Final    NO GROWTH 5 DAYS Performed at [facility], [address] [place], [place] [address]    Report Status 11/20/2019 FINAL  Final  SARS Coronavirus 2 by RT PCR (hospital order, performed in [facility] hospital lab) Nasopharyngeal Nasopharyngeal Swab     Status: None   Collection Time: 11/15/19  7:14 PM   Specimen: Nasopharyngeal Swab  Result Value Ref Range Status   SARS Coronavirus 2 NEGATIVE NEGATIVE Final    Comment: (NOTE) SARS-CoV-2 target nucleic acids are NOT DETECTED.  The SARS-CoV-2 RNA is generally detectable in upper and lower respiratory specimens during the acute phase of infection. The lowest concentration of SARS-CoV-2 viral copies this assay can detect is 250 copies / mL. A negative result does not preclude SARS-CoV-2 infection and should not be used as the sole basis for treatment or other patient management decisions.  A negative result may occur with improper specimen collection / handling, submission of specimen other than nasopharyngeal swab, presence of viral mutation(s) within the areas targeted by this assay, and inadequate number of viral copies (<250 copies / mL). A negative result must be combined with clinical observations, patient history, and epidemiological information.  Fact Sheet for Patients:   StrictlyIdeas.no  Fact Sheet for Healthcare Providers: BankingDealers.co.za  This test is not yet approved or  cleared by the [place] FDA and has been authorized for detection and/or diagnosis of SARS-CoV-2 by FDA under an Emergency Use Authorization (EUA).  This EUA will remain in effect (meaning this test can be used) for the duration of the COVID-19 declaration under Section 564(b)(1)  of the Act, 21 U.S.C. section 360bbb-3(b)(1), unless the authorization  is terminated or revoked sooner.  Performed at [facility], [address] [place], [place] [address]   Expectorated sputum assessment w rflx to resp cult     Status: None   Collection Time: 11/19/19  6:00 AM   Specimen: Sputum  Result Value Ref Range Status   Specimen Description SPUTUM  Final   Special Requests NONE  Final   Sputum evaluation   Final    Sputum specimen not acceptable for testing.  Please recollect.   NOTIFIED VICTORIA BADGETT AT 0822 OF NEED FOR RECOLLECT. [facility] Performed at [facility], [address] [place], [place] [address]    Report Status 11/20/2019 FINAL  Final  Respiratory Panel by PCR     Status: None   Collection Time: 11/19/19 12:57 PM   Specimen: Nasopharyngeal Swab; Respiratory  Result Value Ref Range Status   Adenovirus NOT DETECTED NOT DETECTED Final   Coronavirus 229E NOT DETECTED NOT DETECTED Final    Comment: (NOTE) The Coronavirus on the Respiratory Panel, DOES NOT test for the novel  Coronavirus (2019 nCoV)    Coronavirus HKU1 NOT DETECTED NOT DETECTED Final   Coronavirus NL63 NOT DETECTED NOT DETECTED Final   Coronavirus OC43 NOT DETECTED NOT DETECTED Final   Metapneumovirus NOT DETECTED NOT DETECTED Final   Rhinovirus / Enterovirus NOT DETECTED NOT DETECTED Final   Influenza A NOT DETECTED NOT DETECTED Final   Influenza B NOT DETECTED NOT DETECTED Final   Parainfluenza Virus 1 NOT DETECTED NOT DETECTED Final   Parainfluenza Virus 2 NOT DETECTED NOT DETECTED Final   Parainfluenza Virus 3 NOT DETECTED NOT DETECTED Final   Parainfluenza Virus 4 NOT DETECTED NOT DETECTED Final   Respiratory Syncytial Virus NOT DETECTED NOT DETECTED Final   Bordetella pertussis NOT DETECTED NOT DETECTED Final   Chlamydophila pneumoniae NOT DETECTED NOT DETECTED Final   Mycoplasma pneumoniae NOT DETECTED NOT DETECTED Final    Comment: Performed at [facility], [place]. [address] [place], [place] [address]  MRSA PCR Screening     Status: None   Collection Time: 11/19/19 12:57 PM   Specimen: Nasopharyngeal  Result Value Ref Range Status   MRSA by PCR NEGATIVE NEGATIVE Final    Comment:        The GeneXpert MRSA Assay (FDA approved for NASAL specimens only), is one component of a comprehensive MRSA colonization surveillance program. It is not intended to diagnose MRSA infection nor to guide or monitor treatment for MRSA infections. Performed at [facility], [address] [place], [place] [address]   Expectorated sputum assessment w rflx to resp cult     Status: None   Collection Time: 11/19/19 12:58 PM   Specimen: Sputum  Result Value Ref Range Status   Specimen Description SPUTUM  Final   Special Requests SPUTUM  Final   Sputum evaluation   Final    Sputum specimen not acceptable for testing.  Please recollect.   Early Osmond NOTIFIED AT 1417 ON 11/19/19 [facility] Performed at [facility], [address] [place],  [address]    Report Status 11/19/2019 FINAL  Final     Time coordinating discharge: 35 minutes  SIGNED:   Cordelia Poche, MD [facility] 11/23/2019, 1:28 PM

## 2019-11-23 NOTE — Progress Notes (Signed)
Pulmonary Medicine          Date: 11/23/2019,   MRN# [id number] Patrick Hurst 08-30-1955     AdmissionWeight: (!) 140 kg                 CurrentWeight: (!) 136.9 kg   Referring physician: Dr Juleen China   CHIEF COMPLAINT:   Acute on Chronic hypoxemic respiratory failure   SUBJECTIVE   Patient is feeling well this morning. He was on room air during my evaluation with spO2>92% .     He is cleared from pulmonary standpoint for d/c home.  Please continue doxycycline for 4 more days BID. Patient should be instructed to not lay in supine position post doxycycline dose for at least 1 hour and drink water with pill.   PAST MEDICAL HISTORY   Past Medical History:  Diagnosis Date  . Acute on chronic diastolic CHF (congestive heart failure) ([place]) 06/21/2018  . Acute respiratory failure with hypoxia ([place]) 06/21/2018  . Bell palsy 02/12/2015  . Carpal tunnel syndrome 02/12/2015  . Dependence on nocturnal oxygen therapy    2 LITERS WITH BIPAP  . Depression   . Gastric ulcer   . GERD (gastroesophageal reflux disease)   . Gout   . History of chicken pox   . Multifocal pneumonia 03/04/2019  . Sleep apnea treated with nocturnal BiPAP      SURGICAL HISTORY   Past Surgical History:  Procedure Laterality Date  . AV FISTULA PLACEMENT Left 11/08/2019   Procedure: ARTERIOVENOUS (AV) FISTULA CREATION (RADIALCEPHALIC);  Surgeon: Algernon Huxley, MD;  Location: [facility];  Service: Vascular;  Laterality: Left;  . CATARACT EXTRACTION Bilateral 2014 and 2015  . CHOLECYSTECTOMY  04/28/2011   Laproscopic; Dr. Pat Patrick  . DIALYSIS/PERMA CATHETER INSERTION N/A 05/24/2019   Procedure: DIALYSIS/PERMA CATHETER INSERTION;  Surgeon: Algernon Huxley, MD;  Location: [place] [facility];  Service: Cardiovascular;  Laterality: N/A;  . DIALYSIS/PERMA CATHETER INSERTION N/A 10/09/2019   Procedure: DIALYSIS/PERMA CATHETER INSERTION;  Surgeon: Katha Cabal, MD;  Location: [place] [facility];  Service:  Cardiovascular;  Laterality: N/A;  . EYE SURGERY    . GALLBLADDER SURGERY    . LASIK Bilateral 2019   medical  . SHOULDER SURGERY Right 2012   Dr. Leanor Kail     FAMILY HISTORY   Family History  Problem Relation Age of Onset  . COPD Mother   . Cancer Mother   . Heart disease Father      SOCIAL HISTORY   Social History   Tobacco Use  . Smoking status: Former Smoker    Packs/day: 1.00    Years: 15.00    Pack years: 15.00    Types: Cigarettes    Quit date: 05/03/1978    Years since quitting: 41.5  . Smokeless tobacco: Never Used  . Tobacco comment: started smoking at age 78  Vaping Use  . Vaping Use: Never used  Substance Use Topics  . Alcohol use: No    Alcohol/week: 0.0 standard drinks  . Drug use: No     MEDICATIONS    Home Medication:    Current Medication:  Current Facility-Administered Medications:  .  0.9 %  sodium chloride infusion (Manually program via Guardrails IV Fluids), , Intravenous, Once, Patel, Ekta V, MD .  0.9 %  sodium chloride infusion, 250 mL, Intravenous, PRN, Para Skeans, MD, Last Rate: 10 mL/hr at 11/18/19 1814, 250 mL at 11/18/19 1814 .  0.9 %  sodium chloride infusion, 100 mL, Intravenous, PRN, Lateef, Munsoor, MD .  acetaminophen (TYLENOL) tablet 650 mg, 650 mg, Oral, Q6H PRN **OR** acetaminophen (TYLENOL) suppository 650 mg, 650 mg, Rectal, Q6H PRN, Posey Pronto, Ekta V, MD .  albuterol (PROVENTIL) (2.5 MG/3ML) 0.083% nebulizer solution 2.5 mg, 2.5 mg, Nebulization, Q2H PRN, Florina Ou V, MD, 2.5 mg at 11/19/19 0221 .  alteplase (CATHFLO ACTIVASE) injection 2 mg, 2 mg, Intracatheter, Once PRN, Lateef, Munsoor, MD .  alum & mag hydroxide-simeth (MAALOX/MYLANTA) 200-200-20 MG/5ML suspension 15 mL, 15 mL, Oral, Q4H PRN, Hosie Poisson, MD, 15 mL at 11/22/19 2014 .  amiodarone (PACERONE) tablet 200 mg, 200 mg, Oral, Daily, Florina Ou V, MD, 200 mg at 11/23/19 0910 .  amitriptyline (ELAVIL) tablet 25 mg, 25 mg, Oral, QHS, Florina Ou V,  MD, 25 mg at 11/22/19 2118 .  ascorbic acid (VITAMIN C) tablet 1,000 mg, 1,000 mg, Oral, BID, Para Skeans, MD, 1,000 mg at 11/23/19 0910 .  aspirin EC tablet 81 mg, 81 mg, Oral, Daily, Para Skeans, MD, 81 mg at 11/23/19 0909 .  atorvastatin (LIPITOR) tablet 40 mg, 40 mg, Oral, Daily, Florina Ou V, MD, 40 mg at 11/23/19 0910 .  calcitRIOL (ROCALTROL) capsule 0.5 mcg, 0.5 mcg, Oral, Daily, Florina Ou V, MD, 0.5 mcg at 11/23/19 0911 .  calcium acetate (PHOSLO) capsule 1,334 mg, 1,334 mg, Oral, TID WC, Kolluru, Sarath, MD, 1,334 mg at 11/23/19 0910 .  Chlorhexidine Gluconate Cloth 2 % PADS 6 each, 6 each, Topical, Q0600, Holley Raring, Munsoor, MD, 6 each at 11/23/19 0656 .  dialysis solution 2.5% low-MG/low-CA dianeal solution, , Intraperitoneal, Q24H, Kolluru, Sarath, MD, Last Rate: 0 mL/hr at 11/21/19 0635, New Bag at 11/21/19 1830 .  doxycycline (VIBRA-TABS) tablet 100 mg, 100 mg, Oral, Q12H, Hosie Poisson, MD, 100 mg at 11/23/19 0909 .  febuxostat (ULORIC) tablet 80 mg, 80 mg, Oral, Daily, Para Skeans, MD, 80 mg at 11/23/19 0909 .  fluticasone furoate-vilanterol (BREO ELLIPTA) 100-25 MCG/INH 1 puff, 1 puff, Inhalation, Daily, Florina Ou V, MD, 1 puff at 11/23/19 1000 .  furosemide (LASIX) tablet 40 mg, 40 mg, Oral, Daily, Kolluru, Sarath, MD, 40 mg at 11/23/19 0909 .  gentamicin cream (GARAMYCIN) 0.1 % 1 application, 1 application, Topical, Daily, Kolluru, Sarath, MD, 1 application at 53/66/44 0912 .  heparin injection 1,000 Units, 1,000 Units, Dialysis, PRN, Lateef, Munsoor, MD .  hydrALAZINE (APRESOLINE) tablet 25 mg, 25 mg, Oral, TID, Mariel Aloe, MD, 25 mg at 11/23/19 0908 .  HYDROcodone-acetaminophen (NORCO/VICODIN) 5-325 MG per tablet 1-2 tablet, 1-2 tablet, Oral, Q4H PRN, Florina Ou V, MD .  insulin aspart (novoLOG) injection 0-6 Units, 0-6 Units, Subcutaneous, TID WC, Benita Gutter, RPH, 1 Units at 11/23/19 0347 .  insulin regular human CONCENTRATED (HUMULIN R) 500 UNIT/ML kwikpen  15 Units, 15 Units, Subcutaneous, BID WC, Hosie Poisson, MD, 15 Units at 11/23/19 0915 .  ipratropium-albuterol (DUONEB) 0.5-2.5 (3) MG/3ML nebulizer solution 3 mL, 3 mL, Inhalation, Daily, Florina Ou V, MD, 3 mL at 11/23/19 0753 .  lactulose (CHRONULAC) 10 GM/15ML solution 10 g, 10 g, Oral, Daily, Florina Ou V, MD, 10 g at 11/23/19 0911 .  levothyroxine (SYNTHROID) tablet 100 mcg, 100 mcg, Oral, Q0600, Para Skeans, MD, 100 mcg at 11/23/19 0558 .  lidocaine (PF) (XYLOCAINE) 1 % injection 5 mL, 5 mL, Intradermal, PRN, Lateef, Munsoor, MD .  lidocaine-prilocaine (EMLA) cream 1 application, 1 application, Topical, PRN, Lateef, Munsoor, MD .  loratadine (CLARITIN) tablet  10 mg, 10 mg, Oral, Daily, Para Skeans, MD, 10 mg at 11/23/19 0909 .  metoprolol tartrate (LOPRESSOR) tablet 25 mg, 25 mg, Oral, BID, Para Skeans, MD, 25 mg at 11/23/19 0910 .  morphine 2 MG/ML injection 2 mg, 2 mg, Intravenous, Q2H PRN, Para Skeans, MD .  ondansetron [facility]) injection 4 mg, 4 mg, Intravenous, Q6H PRN, Sharion Settler, NP, 4 mg at 11/19/19 0321 .  pantoprazole (PROTONIX) EC tablet 40 mg, 40 mg, Oral, BID, Hosie Poisson, MD, 40 mg at 11/23/19 0910 .  pentafluoroprop-tetrafluoroeth (GEBAUERS) aerosol 1 application, 1 application, Topical, PRN, Lateef, Munsoor, MD .  sodium chloride flush (NS) 0.9 % injection 3 mL, 3 mL, Intravenous, Q12H, Florina Ou V, MD, 3 mL at 11/23/19 1000 .  sodium chloride flush (NS) 0.9 % injection 3 mL, 3 mL, Intravenous, PRN, Para Skeans, MD .  traZODone (DESYREL) tablet 50 mg, 50 mg, Oral, QHS PRN, Sharion Settler, NP, 50 mg at 11/17/19 2210    ALLERGIES   Mucinex [guaifenesin er] and Levaquin [levofloxacin]     REVIEW OF SYSTEMS    Review of Systems:  Gen:  Denies  fever, sweats, chills weigh loss  HEENT: Denies blurred vision, double vision, ear pain, eye pain, hearing loss, nose bleeds, sore throat Cardiac:  No dizziness, chest pain or heaviness, chest  tightness,edema Resp:   Denies cough or sputum porduction, shortness of breath,wheezing, hemoptysis,  Gi: Denies swallowing difficulty, stomach pain, nausea or vomiting, diarrhea, constipation, bowel incontinence Gu:  Denies bladder incontinence, burning urine Ext:   Denies Joint pain, stiffness or swelling Skin: Denies  skin rash, easy bruising or bleeding or hives Endoc:  Denies polyuria, polydipsia , polyphagia or weight change Psych:   Denies depression, insomnia or hallucinations   Other:  All other systems negative   VS: BP (!) 153/71 (BP Location: Right Arm)   Pulse 68   Temp (!) 97.5 F (36.4 C) (Oral)   Resp 18   Ht 5\' 11"  (1.803 m)   Wt (!) 136.9 kg   SpO2 99%   BMI 42.09 kg/m      PHYSICAL EXAM    GENERAL:NAD, no fevers, chills, no weakness no fatigue HEAD: Normocephalic, atraumatic.  EYES: Pupils equal, round, reactive to light. Extraocular muscles intact. No scleral icterus.  MOUTH: Moist mucosal membrane. Dentition intact. No abscess noted.  EAR, NOSE, THROAT: Clear without exudates. No external lesions.  NECK: Supple. No thyromegaly. No nodules. No JVD.  PULMONARY: Diffuse coarse rhonchi right sided +wheezes CARDIOVASCULAR: S1 and S2. Regular rate and rhythm. No murmurs, rubs, or gallops. No edema. Pedal pulses 2+ bilaterally.  GASTROINTESTINAL: Soft, nontender, nondistended. No masses. Positive bowel sounds. No hepatosplenomegaly.  MUSCULOSKELETAL: No swelling, clubbing, or edema. Range of motion full in all extremities.  NEUROLOGIC: Cranial nerves II through XII are intact. No gross focal neurological deficits. Sensation intact. Reflexes intact.  SKIN: No ulceration, lesions, rashes, or cyanosis. Skin warm and dry. Turgor intact.  PSYCHIATRIC: Mood, affect within normal limits. The patient is awake, alert and oriented x 3. Insight, judgment intact.       IMAGING    DG Chest 2 View  Result Date: 11/18/2019 CLINICAL DATA:  Acute respiratory failure  with hypoxia. EXAM: CHEST - 2 VIEW COMPARISON:  November 15, 2019. FINDINGS: The heart size and mediastinal contours are within normal limits. No pneumothorax or pleural effusion is noted. Right internal jugular dialysis catheter is unchanged in position. Bilateral perihilar opacities are noted, left greater  than right, consistent with pneumonia. The visualized skeletal structures are unremarkable. IMPRESSION: Grossly stable bilateral perihilar opacities, left greater than right, consistent with pneumonia. Electronically Signed   By: Marijo Conception M.D.   On: 11/18/2019 16:11   DG Chest 2 View  Result Date: 11/15/2019 CLINICAL DATA:  Short of breath, pneumonia EXAM: CHEST - 2 VIEW COMPARISON:  11/12/2019 FINDINGS: Frontal and lateral views of the chest demonstrate persistent bilateral perihilar airspace disease, slightly improved on the right since prior study but with significant progression on the left. There is no effusion or pneumothorax. Cardiac silhouette is enlarged but stable. Stable right internal jugular dialysis catheter. No acute bony abnormalities. IMPRESSION: 1. Bilateral perihilar airspace disease, left greater than right. There has been progression in the left since prior study. Differential includes bilateral pneumonia versus asymmetric edema. Electronically Signed   By: Randa Ngo M.D.   On: 11/15/2019 15:37   DG Chest 2 View  Result Date: 11/12/2019 CLINICAL DATA:  Shortness of breath for 4-5 days. EXAM: CHEST - 2 VIEW COMPARISON:  Single-view of the chest 03/07/2019. FINDINGS: Patchy airspace disease in the mid and lower lung zones bilaterally is worse on the left. Heart size is normal. No pneumothorax or pleural effusion. Right IJ approach dialysis catheter noted. IMPRESSION: Patchy bilateral airspace disease consistent with pneumonia. Electronically Signed   By: Inge Rise M.D.   On: 11/12/2019 15:34   CT CHEST WO CONTRAST  Result Date: 11/19/2019 CLINICAL DATA:  Dyspnea,  chronic, diastolic heart failure, end-stage renal disease EXAM: CT CHEST WITHOUT CONTRAST TECHNIQUE: Multidetector CT imaging of the chest was performed following the standard protocol without IV contrast. COMPARISON:  06/19/2019 FINDINGS: Cardiovascular: Extensive coronary artery calcification is noted, predominantly within the left main and left anterior descending coronary artery. Cardiac size is at the upper limits of normal. No pericardial effusion. Central pulmonary arteries are enlarged in keeping with changes of pulmonary arterial hypertension. The thoracic aorta is of normal caliber. Right internal jugular central venous catheter tip is noted within the right atrium. Mediastinum/Nodes: No pathologic thoracic adenopathy. Lungs/Pleura: Since the prior examination, there has developed extensive bilateral asymmetric airspace infiltrates, more severe within the superior segment of the left lower lobe, likely infectious or inflammatory in the acute setting. No superimposed significant pulmonary edema. No pneumothorax or pleural effusion. The central airways are widely patent. Upper Abdomen: Cholecystectomy has been performed. Since the prior examination, there has developed a multilobulated low-attenuation lesion within segment 4 B of the liver which is indeterminate. This may represent a developing hepatic mass or complex fluid collection such as a hepatic abscess. This measures roughly 4.1 x 6.9 cm on axial image # 134/2. A trace amount of perihepatic ascites has developed. Musculoskeletal: No lytic or blastic bone lesion is identified. IMPRESSION: Interval development of multifocal pulmonary infiltrates, most in keeping with atypical infection in the acute setting. Interval development of a multilobulated indeterminate lesion within the right hepatic lobe. This would be better assessed, at least initially, with dedicated hepatics sonography or contrast enhanced CT examination. Coronary artery calcification  Aortic Atherosclerosis (ICD10-I70.0). Electronically Signed   By: Fidela Salisbury MD   On: 11/19/2019 19:36   MR ABDOMEN WO CONTRAST  Result Date: 11/23/2019 CLINICAL DATA:  Evaluate indeterminate liver lesion EXAM: MRI ABDOMEN WITHOUT CONTRAST TECHNIQUE: Multiplanar multisequence MR imaging was performed without the administration of intravenous contrast. COMPARISON:  Ultrasound abdomen 11/20/2019 FINDINGS: Lower chest: No acute abnormality. Hepatobiliary: Within segment 4B and segment 5, adjacent to the gallbladder  fossa, there are several areas loss of signal on the out of phase sequence which correspond to the focal areas of low attenuation seen on CT from 11/19/2019 and ultrasound from 11/20/2019. This is consistent with benign focal fatty deposition. Cholecystectomy. No significant bile duct dilatation. Pancreas: No mass, inflammatory changes, or other parenchymal abnormality identified. Spleen: The spleen measures 15.3 by 12.7 x 6.4 cm (volume = 650 cm^3). No focal splenic lesion identified. Adrenals/Urinary Tract: Normal appearance of the adrenal glands. Bilateral kidney cysts are noted. No hydronephrosis. Stomach/Bowel: Visualized portions within the abdomen are unremarkable. Vascular/Lymphatic: No pathologically enlarged lymph nodes identified. No abdominal aortic aneurysm demonstrated. Other:  No free fluid or fluid collections Musculoskeletal: No suspicious bone lesions identified. IMPRESSION: 1. There are several areas of loss of signal on the out of phase sequence which correspond to the focal areas of low attenuation seen on CT from 11/19/2019 and ultrasound from 11/20/2019. This is consistent with benign focal fatty deposition. Within the limitations of unenhanced technique there are no suspicious liver lesions. 2. Splenomegaly. 3. Bilateral kidney cysts. Electronically Signed   By: Kerby Moors M.D.   On: 11/23/2019 07:25   DG Chest Port 1 View  Result Date: 11/21/2019 CLINICAL DATA:   Abnormal chest radiograph EXAM: PORTABLE CHEST 1 VIEW COMPARISON:  CT 11/19/2019, radiograph 11/18/2019 FINDINGS: Dual lumen right IJ approach dialysis catheter tip terminates at the superior cavoatrial junction in stable position from prior. Telemetry leads overlie the chest. Redemonstration of the bilateral patchy airspace opacities most pronounced in the left mid to lower lung and right lung base. Stable bandlike opacity in the right mid lung likely reflecting some subsegmental atelectasis and/or scarring as was present on comparison CT. No pneumothorax or visible effusion. Cardiomediastinal contours are stable. No acute osseous or soft tissue abnormality. Degenerative changes are present in the imaged spine and shoulders. IMPRESSION: Persistent bilateral patchy airspace opacities, perhaps mildly improved in the left mid lung but with accentuation in the bases likely related to diminished volumes. Stable satisfactory position of a right IJ approach dual lumen central venous catheter. Electronically Signed   By: Lovena Le M.D.   On: 11/21/2019 18:35   VAS [place] UPPER EXTREMITY ARTERIAL DUPLEX  Result Date: 11/09/2019 UPPER EXTREMITY DUPLEX STUDY Performing Technologist: Concha Norway RVT  Examination Guidelines: A complete evaluation includes B-mode imaging, spectral Doppler, color Doppler, and power Doppler as needed of all accessible portions of each vessel. Bilateral testing is considered an integral part of a complete examination. Limited examinations for reoccurring indications may be performed as noted.  Right Pre-Dialysis Findings: +-----------------------+----------+--------------------+---------+--------+ Location               PSV (cm/s)Intralum. Diam. (cm)Waveform Comments +-----------------------+----------+--------------------+---------+--------+ Brachial Antecub. fossa120       0.61                triphasic          +-----------------------+----------+--------------------+---------+--------+ Radial Art at Wrist    92        0.25                triphasic         +-----------------------+----------+--------------------+---------+--------+ Ulnar Art at Wrist     119       0.20                triphasic         +-----------------------+----------+--------------------+---------+--------+ Left Pre-Dialysis Findings: +-----------------------+----------+--------------------+---------+--------+ Location  PSV (cm/s)Intralum. Diam. (cm)Waveform Comments +-----------------------+----------+--------------------+---------+--------+ Brachial Antecub. fossa130       0.66                triphasic         +-----------------------+----------+--------------------+---------+--------+ Radial Art at Wrist    94        0.33                triphasic         +-----------------------+----------+--------------------+---------+--------+ Ulnar Art at Wrist     131       0.22                triphasic         +-----------------------+----------+--------------------+---------+--------+  Summary:  Right: No obstruction visualized in the right upper extremity. Left: No obstruction visualized in the left upper extremity. *See table(s) above for measurements and observations. Electronically signed by Leotis Pain MD on 11/09/2019 at 9:02:37 AM.    Final    ECHOCARDIOGRAM COMPLETE  Result Date: 11/16/2019    ECHOCARDIOGRAM REPORT   Patient Name:   Patrick Hurst Date of Exam: 11/16/2019 Medical Rec #:  [id number]      Height:       71.0 in Accession #:    [id number]     Weight:       308.6 lb Date of Birth:  [birth year]      BSA:          2.536 m Patient Age:    64 years       BP:           128/55 mmHg Patient Gender: M              HR:           74 bpm. Exam Location:  [facility] Procedure: 2D Echo, Cardiac Doppler and Color Doppler Indications:     Dyspnea 786.09  History:         Patient has prior history of Echocardiogram  examinations, most                  recent 03/06/2019. CHF; Risk Factors:Sleep Apnea.  Sonographer:     Sherrie Sport RDCS (AE) Referring Phys:  Theola Sequin Diagnosing Phys: Ida Rogue MD IMPRESSIONS  1. Left ventricular ejection fraction, by estimation, is 60 to 65%. The left ventricle has normal function. The left ventricle has no regional wall motion abnormalities. There is mild left ventricular hypertrophy. Left ventricular diastolic parameters are consistent with Grade II diastolic dysfunction (pseudonormalization).  2. Right ventricular systolic function is normal. The right ventricular size is normal. There is normal pulmonary artery systolic pressure. The estimated right ventricular systolic pressure is 67.2 mmHg.  3. The aortic valve is moderately calcified. Mild to moderate aortic valve sclerosis/calcification is present, without any evidence of aortic stenosis.  4. Left atrial size was mildly dilated. FINDINGS  Left Ventricle: Left ventricular ejection fraction, by estimation, is 60 to 65%. The left ventricle has normal function. The left ventricle has no regional wall motion abnormalities. The left ventricular internal cavity size was normal in size. There is  mild left ventricular hypertrophy. Left ventricular diastolic parameters are consistent with Grade II diastolic dysfunction (pseudonormalization). Right Ventricle: The right ventricular size is normal. No increase in right ventricular wall thickness. Right ventricular systolic function is normal. There is normal pulmonary artery systolic pressure. The tricuspid regurgitant velocity is 1.97 m/s, and  with an assumed right atrial pressure of 10 mmHg,  the estimated right ventricular systolic pressure is 70.3 mmHg. Left Atrium: Left atrial size was mildly dilated. Right Atrium: Right atrial size was normal in size. Pericardium: There is no evidence of pericardial effusion. Mitral Valve: The mitral valve is normal in structure. Normal mobility of  the mitral valve leaflets. Mild mitral annular calcification. Mild mitral valve regurgitation. No evidence of mitral valve stenosis. Tricuspid Valve: The tricuspid valve is normal in structure. Tricuspid valve regurgitation is not demonstrated. No evidence of tricuspid stenosis. Aortic Valve: The aortic valve is normal in structure. Aortic valve regurgitation is not visualized. Mild to moderate aortic valve sclerosis/calcification is present, without any evidence of aortic stenosis. Aortic valve mean gradient measures 8.5 mmHg. Aortic valve peak gradient measures 16.6 mmHg. Aortic valve area, by VTI measures 2.29 cm. Pulmonic Valve: The pulmonic valve was normal in structure. Pulmonic valve regurgitation is not visualized. No evidence of pulmonic stenosis. Aorta: The aortic root is normal in size and structure. Venous: The inferior vena cava is normal in size with greater than 50% respiratory variability, suggesting right atrial pressure of 3 mmHg. IAS/Shunts: No atrial level shunt detected by color flow Doppler.  LEFT VENTRICLE PLAX 2D LVIDd:         6.18 cm  Diastology LVIDs:         3.40 cm  LV e' lateral:   14.70 cm/s LV PW:         1.54 cm  LV E/e' lateral: 7.7 LV IVS:        0.93 cm  LV e' medial:    5.44 cm/s LVOT diam:     2.30 cm  LV E/e' medial:  20.8 LV SV:         97 LV SV Index:   38 LVOT Area:     4.15 cm  RIGHT VENTRICLE RV Basal diam:  3.25 cm RV S prime:     18.40 cm/s TAPSE (M-mode): 3.7 cm LEFT ATRIUM            Index       RIGHT ATRIUM           Index LA diam:      5.20 cm  2.05 cm/m  RA Area:     25.00 cm LA Vol (A2C): 187.0 ml 73.75 ml/m RA Volume:   79.50 ml  31.35 ml/m LA Vol (A4C): 112.0 ml 44.17 ml/m  AORTIC VALVE                    PULMONIC VALVE AV Area (Vmax):    2.10 cm     PV Vmax:        1.01 m/s AV Area (Vmean):   2.07 cm     PV Peak grad:   4.1 mmHg AV Area (VTI):     2.29 cm     RVOT Peak grad: 8 mmHg AV Vmax:           203.50 cm/s AV Vmean:          137.500 cm/s AV VTI:             0.422 m AV Peak Grad:      16.6 mmHg AV Mean Grad:      8.5 mmHg LVOT Vmax:         103.00 cm/s LVOT Vmean:        68.500 cm/s LVOT VTI:          0.233 m LVOT/AV VTI ratio: 0.55  AORTA Ao Root  diam: 3.70 cm MITRAL VALVE                TRICUSPID VALVE MV Area (PHT): 3.20 cm     TR Peak grad:   15.5 mmHg MV Decel Time: 237 msec     TR Vmax:        197.00 cm/s MV E velocity: 113.00 cm/s MV A velocity: 100.00 cm/s  SHUNTS MV E/A ratio:  1.13         Systemic VTI:  0.23 m                             Systemic Diam: 2.30 cm Ida Rogue MD Electronically signed by Ida Rogue MD Signature Date/Time: 11/16/2019/2:31:17 PM    Final    [place] RT LOWER EXTREM LTD SOFT TISSUE NON VASCULAR  Result Date: 11/22/2019 CLINICAL DATA:  Fluctuant mass on the medial aspect of the right leg EXAM: ULTRASOUND RIGHT LOWER EXTREMITY LIMITED TECHNIQUE: Ultrasound examination of the lower extremity soft tissues was performed in the area of clinical concern. COMPARISON:  None. FINDINGS: The patient's palpable area of concern corresponds to an area of subcutaneous edema. There is no well-formed fluid collection or abscess. There may be some mild overlying skin thickening. There is no mass. IMPRESSION: Lower extremity edema without evidence for an abscess, fluid collection, or mass. Electronically Signed   By: Constance Holster M.D.   On: 11/22/2019 19:13   VAS [place] UPPER EXT VEIN MAPPING (PRE-OP AVF)  Result Date: 11/09/2019 UPPER EXTREMITY VEIN MAPPING  Indications: Pre-access. History: Esrd.  Performing Technologist: Concha Norway RVT  Examination Guidelines: A complete evaluation includes B-mode imaging, spectral Doppler, color Doppler, and power Doppler as needed of all accessible portions of each vessel. Bilateral testing is considered an integral part of a complete examination. Limited examinations for reoccurring indications may be performed as noted. +-----------------+-------------+----------+---------+ Right Cephalic    Diameter (cm)Depth (cm)Findings  +-----------------+-------------+----------+---------+ Prox upper arm       0.54                         +-----------------+-------------+----------+---------+ Mid upper arm        0.56               branching +-----------------+-------------+----------+---------+ Dist upper arm       0.52                         +-----------------+-------------+----------+---------+ Antecubital fossa    0.56                         +-----------------+-------------+----------+---------+ Prox forearm         0.82                         +-----------------+-------------+----------+---------+ Mid forearm          0.47               branching +-----------------+-------------+----------+---------+ Dist forearm         0.38                         +-----------------+-------------+----------+---------+ +-----------------+-------------+----------+--------+ Right Basilic    Diameter (cm)Depth (cm)Findings +-----------------+-------------+----------+--------+ Prox upper arm       0.72                        +-----------------+-------------+----------+--------+  Mid upper arm        0.45                        +-----------------+-------------+----------+--------+ Dist upper arm       0.45                        +-----------------+-------------+----------+--------+ Antecubital fossa    0.44                        +-----------------+-------------+----------+--------+ Prox forearm         0.43                        +-----------------+-------------+----------+--------+ +-----------------+-------------+----------+--------+ Left Cephalic    Diameter (cm)Depth (cm)Findings +-----------------+-------------+----------+--------+ Prox upper arm       0.60                        +-----------------+-------------+----------+--------+ Mid upper arm        0.50                        +-----------------+-------------+----------+--------+ Dist  upper arm       0.48                        +-----------------+-------------+----------+--------+ Antecubital fossa    0.73                        +-----------------+-------------+----------+--------+ Prox forearm         0.44                        +-----------------+-------------+----------+--------+ Mid forearm          0.49                        +-----------------+-------------+----------+--------+ Dist forearm         0.41                        +-----------------+-------------+----------+--------+ +-----------------+-------------+----------+--------+ Left Basilic     Diameter (cm)Depth (cm)Findings +-----------------+-------------+----------+--------+ Prox upper arm       0.64                        +-----------------+-------------+----------+--------+ Mid upper arm        0.57                        +-----------------+-------------+----------+--------+ Dist upper arm       0.67                        +-----------------+-------------+----------+--------+ Antecubital fossa    0.62                        +-----------------+-------------+----------+--------+ Prox forearm         0.43                        +-----------------+-------------+----------+--------+ Summary: Right: Normal caliber and compressible veins throughout. Left: Normal caliber and compressible veins throughout. *See table(s) above for measurements and observations.  Diagnosing physician: Leotis Pain MD Electronically signed by Leotis Pain MD on 11/09/2019 at  9:02:34 AM.    Final    US Abdomen Limited RUQ  Result Date: 11/20/2019 CLINICAL DATA:  Liver lesion. EXAM: ULTRASOUND ABDOMEN LIMITED RIGHT UPPER QUADRANT COMPARISON:  Chest CT 11/19/2019. Right upper quadrant abdominal ultrasound 05/08/2016. FINDINGS: Gallbladder: Surgically absent. Common bile duct: Diameter: 4 mm Liver: Background increased parenchymal echogenicity diffusely. Approximately 7.8 x 5.3 x 7.6 cm lesion anteriorly in the  liver with heterogeneous posterior shadowing and a small amount of internal vascularity. The lesion demonstrates heterogeneous echogenicity but is primarily hyperechoic. Portal vein is patent on color Doppler imaging with normal direction of blood flow towards the liver. Other: None. IMPRESSION: Indeterminate solid liver mass. When the patient is clinically stable and able to follow directions and hold their breath (preferably as an outpatient), further evaluation with a contrast-enhanced abdominal MRI is recommended. Electronically Signed   By: Logan Bores M.D.   On: 11/20/2019 11:53           ASSESSMENT/PLAN   Acute on chronic hypoxemic respiratory failure -due to CAP present on admission - possibly due to bacteria such as strep pneumo vs viral etiology - will place procal trending, RVP negative, nasal MRSA pcr-negative -repeat respiratory cultures-not acceptable specimen -COVID negative x 6 over past 1 year - cardio/renal/hospitalist service on case - appreciate collaboration  - Rocephin/doxy-cleared for d/c home please continue doxycycline 4 more days BID - avoid pill induced esophagitis by not laying in supine position post antibiotic dose and drink 1 cup water.  -CT chest - bilateral pna worse on left lung as above  -repeat CXR with improvement   Acute blood loss anemia-resolved  - hb<7  - s/p transfusion   - monitoring h/h  - contributing to hypoxemia  - liver mass on [place] abd  - monitor for hemoptysis   COPD -agree with current care path  -  MetaNEB therapy for aggressive BPH  -take home IS at bedside and use multiple times daily .   OSA - continue home CPAP with O2 bleed in    Thank you for allowing me to participate in the care of this patient.    Patient/Family are satisfied with care plan and all questions have been answered.   This document was prepared using Dragon voice recognition software and may include unintentional dictation errors.     Ottie Glazier, M.D.  Division of [place]

## 2019-11-26 ENCOUNTER — Telehealth: Payer: Self-pay

## 2019-11-26 NOTE — Telephone Encounter (Signed)
HFU scheduled 11/29/19 @ 3:00 PM.

## 2019-11-26 NOTE — Telephone Encounter (Signed)
Patient was recently discharged from the hospital on 11/23/19.  No TCM completed, patient does not qualify for TCM services due to having ESRD and being on dialysis.  Per discharge summary patient needs follow up with PCP. HFU scheduled for 11/29/19 @ 3:00 PM with Patrick Hurst.

## 2019-11-29 ENCOUNTER — Other Ambulatory Visit: Payer: Self-pay

## 2019-11-29 ENCOUNTER — Ambulatory Visit (INDEPENDENT_AMBULATORY_CARE_PROVIDER_SITE_OTHER): Payer: Commercial Managed Care - PPO | Admitting: Adult Health

## 2019-11-29 ENCOUNTER — Ambulatory Visit
Admission: RE | Admit: 2019-11-29 | Discharge: 2019-11-29 | Disposition: A | Discharge status: Home / Self Care | Payer: Commercial Managed Care - PPO | Attending: Adult Health | Admitting: Adult Health

## 2019-11-29 ENCOUNTER — Ambulatory Visit
Admission: RE | Admit: 2019-11-29 | Discharge: 2019-11-29 | Disposition: A | Discharge status: Home / Self Care | Payer: Commercial Managed Care - PPO | Source: Ambulatory Visit | Attending: Adult Health | Admitting: Adult Health

## 2019-11-29 ENCOUNTER — Encounter: Payer: Self-pay | Admitting: Adult Health

## 2019-11-29 VITALS — BP 130/60 | HR 81 | Temp 98.3°F | Resp 16 | Wt 293.4 lb

## 2019-11-29 DIAGNOSIS — J189 Pneumonia, unspecified organism: Secondary | ICD-10-CM | POA: Diagnosis not present

## 2019-11-29 DIAGNOSIS — R0602 Shortness of breath: Secondary | ICD-10-CM | POA: Insufficient documentation

## 2019-11-29 DIAGNOSIS — N186 End stage renal disease: Secondary | ICD-10-CM | POA: Diagnosis not present

## 2019-11-29 DIAGNOSIS — J449 Chronic obstructive pulmonary disease, unspecified: Secondary | ICD-10-CM

## 2019-11-29 DIAGNOSIS — I5032 Chronic diastolic (congestive) heart failure: Secondary | ICD-10-CM | POA: Insufficient documentation

## 2019-11-29 DIAGNOSIS — Z992 Dependence on renal dialysis: Secondary | ICD-10-CM

## 2019-11-29 DIAGNOSIS — J9601 Acute respiratory failure with hypoxia: Secondary | ICD-10-CM

## 2019-11-29 NOTE — Progress Notes (Signed)
Chest x ray looks improved from previous. Monitor symptoms and if worsening at any time follow up, if emergent ER. Keep plan as discussed in office.

## 2019-11-29 NOTE — Progress Notes (Signed)
Established patient visit   Patient: Patrick Hurst   DOB: 12-21-1955   64 y.o. Male  MRN: [id number] Visit Date: 11/29/2019  Today's healthcare provider: Marcille Buffy, FNP   Chief Complaint  Patient presents with  . Hospitalization Follow-up   Subjective    HPI  Follow up Hospitalization  Patient was admitted to [facility] on 11/15/19 and discharged on 11/23/19 he has known diagnosis of end-stage renal disease currently on peritoneal dialysis, sleep apnea, atrial fibrillation, diabetes.  He was seen in the ER on 11/15/2019 for shortness of breath.  He was treated with oxygen nasal cannula currently on 2 L.  Patient was having chills and his oxygen sats were down in the 80s when he called her primary care office and was sent to the emergency room.  He was also having hemoptysis at time of admission.  She was also hypoxic at 89% on room air at arrival to the ER and his oxygen sat was over 90 on 2 L nasal cannula. He was treated for community acquired pneumonia and acute respiratory failure with hypoxia. Treatment for this included supplemental oxygen,IV abx with rocephin and azithromycin / PRN nebs.  He reports he has only been using DuoNeb treatments once in the morning.  He is advised that he can use them every 6 hours if he has to and recommends that he at least do the morning and evening.  Covid testing was negative on 11/15/2019.  Denies any known exposures. Telephone follow up was done on, No TCM was done He reports satisfactory compliance with treatment. He reports this condition is improved.  But has not resolved, he still has some shortness of breath and fatigue and he is hoping to feel better soon. He is on Doxycycline currently.. Seeing pulmonary on 8/4//2021.Seeing Alekerov. MD.  He reports his oxygen sats at home has been around 89% when he is up walking with 2 L and at rest he has been around 93 to 94%.  No significant or worsening edema. He wore Bipap prior to  hospitalization. No oxygen prior   He has been on dialyasis since January 2021. Had dialysis yesterday.  He had an echocardiogram on 11/16/2019, do not see results of that in the system. He also received a blood transfusion for a hemoglobin of 6.7 and a history of guaiac positive stools.  Consulted on by Dr. Marius Ditch in the hospital he did have indigestion he did have an MRI that did not reveal liver cancer per Dr. Marius Ditch.  No oral iron was started suspected to be anemia of chronic disease.  Guaiac stool was not recommended in the inpatient setting.  He needed outpatient follow-up with GI once he recovers from pneumonia.  Protonix 40 mg was continued twice daily  He needs a handicap placard filled out today for application.  Is unable to walk 200 feet without shortness of breath and stopping any does require oxygen at 2 L currently. ----------------------------------------------------------------------------------------- -   Patient Active Problem List   Diagnosis Date Noted  . Pneumonia due to infectious organism 11/15/2019  . Dependence on nocturnal oxygen therapy   . Vitamin D deficiency   . ESRD on dialysis ([place]) 06/01/2019  . History of atrial fibrillation   . Respiratory failure with hypoxia ([place]) 03/01/2019  . COPD (chronic obstructive pulmonary disease) ([place]) 12/08/2018  . Diabetic retinopathy of both eyes without macular edema associated with type 2 diabetes mellitus ([place]) 10/20/2018  . Secondary hyperparathyroidism of renal origin ([place]) 06/20/2018  .  Chronic diastolic CHF (congestive heart failure) ([place]) 02/27/2018  . Psoriasis (a type of skin inflammation) 10/19/2016  . Primary osteoarthritis of both knees 10/01/2015  . Morbid (severe) obesity due to excess calories ([place]) 10/01/2015  . Splenomegaly 09/19/2015  . ANA positive 07/21/2015  . Hepatic fibrosis 04/16/2015  . Charcot ankle 04/02/2015  . Thrombocytopenia ([place]) 02/14/2015  . Allergic rhinitis 02/12/2015  . Clinical  depression 02/12/2015  . Diabetes mellitus with neuropathy ([place]) 02/12/2015  . Erectile dysfunction 02/12/2015  . HLD (hyperlipidemia) 02/12/2015  . Hypertension 02/12/2015  . Hypothyroidism 02/12/2015  . Anemia, iron deficiency 02/12/2015  . Low testosterone 02/12/2015  . Peripheral neuropathy 02/12/2015  . Shortness of breath 02/12/2015  . Obstructive sleep apnea 02/12/2015  . Gout 05/05/2012   Past Medical History:  Diagnosis Date  . Acute on chronic diastolic CHF (congestive heart failure) ([place]) 06/21/2018  . Acute respiratory failure with hypoxia ([place]) 06/21/2018  . Bell palsy 02/12/2015  . Carpal tunnel syndrome 02/12/2015  . Dependence on nocturnal oxygen therapy    2 LITERS WITH BIPAP  . Depression   . Gastric ulcer   . GERD (gastroesophageal reflux disease)   . Gout   . History of chicken pox   . Multifocal pneumonia 03/04/2019  . Sleep apnea treated with nocturnal BiPAP    Past Surgical History:  Procedure Laterality Date  . AV FISTULA PLACEMENT Left 11/08/2019   Procedure: ARTERIOVENOUS (AV) FISTULA CREATION (RADIALCEPHALIC);  Surgeon: Algernon Huxley, MD;  Location: [facility] ORS;  Service: Vascular;  Laterality: Left;  . CATARACT EXTRACTION Bilateral 2014 and 2015  . CHOLECYSTECTOMY  04/28/2011   Laproscopic; Dr. Pat Patrick  . DIALYSIS/PERMA CATHETER INSERTION N/A 05/24/2019   Procedure: DIALYSIS/PERMA CATHETER INSERTION;  Surgeon: Algernon Huxley, MD;  Location: [facility];  Service: Cardiovascular;  Laterality: N/A;  . DIALYSIS/PERMA CATHETER INSERTION N/A 10/09/2019   Procedure: DIALYSIS/PERMA CATHETER INSERTION;  Surgeon: Katha Cabal, MD;  Location: [facility];  Service: Cardiovascular;  Laterality: N/A;  . EYE SURGERY    . GALLBLADDER SURGERY    . LASIK Bilateral 2019   medical  . SHOULDER SURGERY Right 2012   Dr. Leanor Kail   Allergies  Allergen Reactions  . Mucinex [Guaifenesin Er] Swelling    Throat swelling, increases heart rate  .  Levaquin [Levofloxacin] Palpitations       Medications: Outpatient Medications Prior to Visit  Medication Sig  . albuterol (PROAIR HFA) 108 (90 Base) MCG/ACT inhaler Inhale 2 puffs into the lungs every 6 (six) hours as needed for wheezing or shortness of breath.  Marland Kitchen amiodarone (PACERONE) 200 MG tablet Take 200 mg by mouth daily.  Marland Kitchen amitriptyline (ELAVIL) 25 MG tablet TAKE 1 TABLET AT BEDTIME (Patient taking differently: Take 25 mg by mouth at bedtime. )  . amLODipine (NORVASC) 5 MG tablet Take 1 tablet (5 mg total) by mouth daily.  . Ascorbic Acid (VITAMIN C) 1000 MG tablet Take 1,000 mg by mouth 2 (two) times daily.   Marland Kitchen atorvastatin (LIPITOR) 40 MG tablet TAKE 1 TABLET DAILY  . BD INSULIN SYRINGE U/F 31G X 5/16" 1 ML MISC   . calcitRIOL (ROCALTROL) 0.5 MCG capsule Take 0.5 mcg by mouth daily.  . cetirizine (ZYRTEC) 10 MG tablet Take 10 mg by mouth daily.   Marland Kitchen ELIQUIS 2.5 MG TABS tablet Take 2.5 mg by mouth 2 (two) times daily.   . fluticasone furoate-vilanterol (BREO ELLIPTA) 100-25 MCG/INH AEPB Inhale 1 puff into the lungs daily.  Marland Kitchen  gentamicin cream (GARAMYCIN) 0.1 % Apply 1 application topically daily.  . insulin regular human CONCENTRATED (HUMULIN R) 500 UNIT/ML injection Inject up to 25 units twice a day, or as directed by physician (Patient taking differently: Inject 50 Units into the skin 2 (two) times daily with a meal. 60 UNITS IN THE AM & 50 UNITS AT NIGHT)  . ipratropium-albuterol (DUONEB) 0.5-2.5 (3) MG/3ML SOLN INHALE 3 ML BY NEBULIZATION EVERY 4 HOURS AS NEEDED (Patient taking differently: Inhale 3 mLs into the lungs daily. )  . lactulose (CHRONULAC) 10 GM/15ML solution Take 30 mLs by mouth daily.  Marland Kitchen levothyroxine (SYNTHROID) 100 MCG tablet TAKE 1 TABLET DAILY (Patient taking differently: Take 100 mcg by mouth daily before breakfast. )  . metoprolol tartrate (LOPRESSOR) 50 MG tablet Take 0.5 tablets (25 mg total) by mouth 2 (two) times daily.  . pantoprazole (PROTONIX) 40 MG  tablet TAKE 1 TABLET TWICE A DAY (Patient taking differently: Take 40 mg by mouth daily. )  . testosterone cypionate (DEPO-TESTOSTERONE) 200 MG/ML injection Inject 1 mL (200 mg total) into the muscle every 14 (fourteen) days.  Marland Kitchen torsemide (DEMADEX) 20 MG tablet Take 40 mg by mouth daily.  Marland Kitchen ULORIC 80 MG TABS Take 80 mg by mouth daily.   . hydrALAZINE (APRESOLINE) 25 MG tablet TAKE 1 TABLET THREE TIMES A DAY (Patient not taking: Reported on 11/29/2019)   No facility-administered medications prior to visit.    Review of Systems  Constitutional: Positive for fatigue.  HENT: Negative.   Respiratory: Positive for cough and shortness of breath. Negative for apnea, choking, chest tightness, wheezing and stridor.   Cardiovascular: Negative for chest pain, palpitations and leg swelling.  Gastrointestinal: Negative.   Genitourinary: Negative.   Musculoskeletal: Negative.   Skin: Negative.  Negative for rash.  Neurological: Negative.   Hematological: Negative.   Psychiatric/Behavioral: Negative.     Last CBC Lab Results  Component Value Date   WBC 7.7 11/21/2019   HGB 8.1 (L) 11/21/2019   HCT 24.2 (L) 11/21/2019   MCV 91.0 11/21/2019   MCH 30.5 11/21/2019   RDW 15.2 11/21/2019   PLT 124 (L) 11/21/2019      Objective    BP (!) 130/60   Pulse 81   Temp 98.3 F (36.8 C) (Oral)   Resp 16   Wt (!) 293 lb 6.4 oz (133.1 kg)   SpO2 93% Comment: 2 liters of oxygen  BMI 40.92 kg/m  BP Readings from Last 3 Encounters:  11/29/19 (!) 130/60  11/23/19 (!) 163/71  11/12/19 140/64   Wt Readings from Last 3 Encounters:  11/29/19 (!) 293 lb 6.4 oz (133.1 kg)  11/22/19 (!) 301 lb 13 oz (136.9 kg)  11/12/19 (!) 309 lb 6.4 oz (140.3 kg)    weight loss since 11/22/19   Physical Exam Constitutional:      General: He is not in acute distress.    Appearance: Normal appearance. He is obese. He is not ill-appearing, toxic-appearing or diaphoretic.  HENT:     Head: Normocephalic and atraumatic.   Neck:     Vascular: No carotid bruit.  Cardiovascular:     Rate and Rhythm: Normal rate and regular rhythm.     Pulses:          Dorsalis pedis pulses are 1+ on the right side and 1+ on the left side.       Posterior tibial pulses are 1+ on the right side and 1+ on the left side.  Heart sounds: Normal heart sounds. No murmur heard.  No friction rub. No gallop.   Pulmonary:     Effort: Pulmonary effort is normal. No accessory muscle usage, prolonged expiration or respiratory distress.     Breath sounds: Examination of the right-lower field reveals decreased breath sounds. Examination of the left-lower field reveals decreased breath sounds. Decreased breath sounds present. No wheezing, rhonchi or rales.     Comments: On 2 Liters of nasal canula oxygen no acute distress.   Able to ambulate in hall.  Musculoskeletal:     Cervical back: Normal range of motion. No rigidity or tenderness.     Right lower leg: 1+ Pitting Edema present.     Left lower leg: 1+ Pitting Edema present.  Feet:     Right foot:     Skin integrity: Skin integrity normal.     Left foot:     Skin integrity: Skin integrity normal.  Lymphadenopathy:     Cervical: No cervical adenopathy.  Neurological:     Mental Status: He is alert.       No results found for any visits on 11/29/19.  Assessment & Plan     Chronic diastolic CHF (congestive heart failure) ([place]) - Plan: DG Chest 2 View, CBC with Differential/Platelet, Comprehensive Metabolic Panel (CMET), B Nat Peptide  ESRD on dialysis ([place])  Pneumonia due to infectious organism, unspecified laterality, unspecified part of lung - Plan: DG Chest 2 View, CBC with Differential/Platelet, Comprehensive Metabolic Panel (CMET), B Nat Peptide  Shortness of breath - Plan: DG Chest 2 View  Morbid (severe) obesity due to excess calories (H[facility]), Chronic  Chronic obstructive pulmonary disease, unspecified COPD type ([place])  Acute respiratory failure with hypoxia  (HCC)  Handicap form for car placard filled out. .Continue Doxycycline . Will obtain chest x ray , BNP, CBC, CMP for follow up.   Keep recommended follow up with pulmonary next week and all other scheduled follow ups. Dialysis currently has follow up.Call if any questions.  Red Flags discussed. The patient was given clear instructions to go to ER or return to medical center if any red flags develop, symptoms do not improve, worsen or new problems develop. They verbalized understanding.   Return in about 2 weeks (around 12/13/2019), or if symptoms worsen or fail to improve, for at any time for any worsening symptoms, Go to Emergency room/ urgent care if worse.     [place] Krishiv Sandler, FNP, have reviewed all documentation for this visit. The documentation on 11/29/19 for the exam, diagnosis, procedures, and orders are all accurate and complete.    Marcille Buffy, [place] [phone] (phone) [phone] (fax)  [place]

## 2019-11-29 NOTE — Patient Instructions (Signed)
Community-Acquired Pneumonia, Adult Pneumonia is an infection of the lungs. It causes swelling in the airways of the lungs. Mucus and fluid may also build up inside the airways. One type of pneumonia can happen while a person is in a hospital. A different type can happen when a person is not in a hospital (community-acquired pneumonia).  What are the causes?  This condition is caused by germs (viruses, bacteria, or fungi). Some types of germs can be passed from one person to another. This can happen when you breathe in droplets from the cough or sneeze of an infected person. What increases the risk? You are more likely to develop this condition if you:  Have a long-term (chronic) disease, such as: ? Chronic obstructive pulmonary disease (COPD). ? Asthma. ? Cystic fibrosis. ? Congestive heart failure. ? Diabetes. ? Kidney disease.  Have HIV.  Have sickle cell disease.  Have had your spleen removed.  Do not take good care of your teeth and mouth (poor dental hygiene).  Have a medical condition that increases the risk of breathing in droplets from your own mouth and nose.  Have a weakened body defense system (immune system).  Are a smoker.  Travel to areas where the germs that cause this illness are common.  Are around certain animals or the places they live. What are the signs or symptoms?  A dry cough.  A wet (productive) cough.  Fever.  Sweating.  Chest pain. This often happens when breathing deeply or coughing.  Fast breathing or trouble breathing.  Shortness of breath.  Shaking chills.  Feeling tired (fatigue).  Muscle aches. How is this treated? Treatment for this condition depends on many things. Most adults can be treated at home. In some cases, treatment must happen in a hospital. Treatment may include:  Medicines given by mouth or through an IV tube.  Being given extra oxygen.  Respiratory therapy. In rare cases, treatment for very bad pneumonia  may include:  Using a machine to help you breathe.  Having a procedure to remove fluid from around your lungs. Follow these instructions at home: Medicines  Take over-the-counter and prescription medicines only as told by your doctor. ? Only take cough medicine if you are losing sleep.  If you were prescribed an antibiotic medicine, take it as told by your doctor. Do not stop taking the antibiotic even if you start to feel better. General instructions   Sleep with your head and neck raised (elevated). You can do this by sleeping in a recliner or by putting a few pillows under your head.  Rest as needed. Get at least 8 hours of sleep each night.  Drink enough water to keep your pee (urine) pale yellow.  Eat a healthy diet that includes plenty of vegetables, fruits, whole grains, low-fat dairy products, and lean protein.  Do not use any products that contain nicotine or tobacco. These include cigarettes, e-cigarettes, and chewing tobacco. If you need help quitting, ask your doctor.  Keep all follow-up visits as told by your doctor. This is important. How is this prevented? A shot (vaccine) can help prevent pneumonia. Shots are often suggested for:  People older than 63 years of age.  People older than 63 years of age who: ? Are having cancer treatment. ? Have long-term (chronic) lung disease. ? Have problems with their body's defense system. You may also prevent pneumonia if you take these actions:  Get the flu (influenza) shot every year.  Go to the dentist as   often as told.  Wash your hands often. If you cannot use soap and water, use hand sanitizer. Contact a doctor if:  You have a fever.  You lose sleep because your cough medicine does not help. Get help right away if:  You are short of breath and it gets worse.  You have more chest pain.  Your sickness gets worse. This is very serious if: ? You are an older adult. ? Your body's defense system is weak.  You  cough up blood. Summary  Pneumonia is an infection of the lungs.  Most adults can be treated at home. Some will need treatment in a hospital.  Drink enough water to keep your pee pale yellow.  Get at least 8 hours of sleep each night. This information is not intended to replace advice given to you by your health care provider. Make sure you discuss any questions you have with your health care provider. Document Revised: 08/09/2018 Document Reviewed: 12/15/2017 Elsevier Patient Education  2020 [employer]  

## 2019-11-30 ENCOUNTER — Telehealth: Payer: Self-pay | Admitting: *Deleted

## 2019-11-30 LAB — COMPREHENSIVE METABOLIC PANEL
ALT: 20 IU/L (ref 0–44)
AST: 17 IU/L (ref 0–40)
Albumin/Globulin Ratio: 1.2 (ref 1.2–2.2)
Albumin: 3.8 g/dL (ref 3.8–4.8)
Alkaline Phosphatase: 152 IU/L — ABNORMAL HIGH (ref 48–121)
BUN/Creatinine Ratio: 9 — ABNORMAL LOW (ref 10–24)
BUN: 107 mg/dL (ref 8–27)
Bilirubin Total: 0.3 mg/dL (ref 0.0–1.2)
CO2: 19 mmol/L — ABNORMAL LOW (ref 20–29)
Calcium: 8.8 mg/dL (ref 8.6–10.2)
Chloride: 92 mmol/L — ABNORMAL LOW (ref 96–106)
Creatinine, Ser: 12.49 mg/dL — ABNORMAL HIGH (ref 0.76–1.27)
GFR calc Af Amer: 4 mL/min/{1.73_m2} — ABNORMAL LOW (ref >59)
GFR calc non Af Amer: 4 mL/min/{1.73_m2} — ABNORMAL LOW (ref >59)
Globulin, Total: 3.2 g/dL (ref 1.5–4.5)
Glucose: 94 mg/dL (ref 65–99)
Potassium: 4.5 mmol/L (ref 3.5–5.2)
Sodium: 140 mmol/L (ref 134–144)
Total Protein: 7 g/dL (ref 6.0–8.5)

## 2019-11-30 LAB — CBC WITH DIFFERENTIAL/PLATELET
Basophils Absolute: 0.1 10*3/uL (ref 0.0–0.2)
Basos: 1 %
EOS (ABSOLUTE): 0.1 10*3/uL (ref 0.0–0.4)
Eos: 1 %
Hematocrit: 24.3 % — ABNORMAL LOW (ref 37.5–51.0)
Hemoglobin: 8.5 g/dL — ABNORMAL LOW (ref 13.0–17.7)
Immature Grans (Abs): 0.1 10*3/uL (ref 0.0–0.1)
Immature Granulocytes: 1 %
Lymphocytes Absolute: 0.7 10*3/uL (ref 0.7–3.1)
Lymphs: 7 %
MCH: 29.9 pg (ref 26.6–33.0)
MCHC: 35 g/dL (ref 31.5–35.7)
MCV: 86 fL (ref 79–97)
Monocytes Absolute: 0.9 10*3/uL (ref 0.1–0.9)
Monocytes: 9 %
Neutrophils Absolute: 7.9 10*3/uL — ABNORMAL HIGH (ref 1.4–7.0)
Neutrophils: 81 %
Platelets: 138 10*3/uL — ABNORMAL LOW (ref 150–450)
RBC: 2.84 x10E6/uL — ABNORMAL LOW (ref 4.14–5.80)
RDW: 14.4 % (ref 11.6–15.4)
WBC: 9.8 10*3/uL (ref 3.4–10.8)

## 2019-11-30 LAB — BRAIN NATRIURETIC PEPTIDE: BNP: 29.1 pg/mL (ref 0.0–100.0)

## 2019-11-30 NOTE — Telephone Encounter (Signed)
Noted keep follow up's as discussed and monitor blood pressure and be sure hydrated - not to over hydrate.

## 2019-11-30 NOTE — Telephone Encounter (Signed)
-----   Message from Doreen Beam, [place] sent at 11/29/2019  4:49 PM EDT ----- Chest x ray looks improved from previous. Monitor symptoms and if worsening at any time follow up, if emergent ER. Keep plan as discussed in office.

## 2019-11-30 NOTE — Telephone Encounter (Signed)
-----   Message from Doreen Beam, [employer] [place] sent at 11/30/2019  2:22 PM EDT ----- BNP within normal limits.

## 2019-11-30 NOTE — Telephone Encounter (Signed)
-----   Message from Doreen Beam, [place] sent at 11/30/2019  2:22 PM EDT ----- Patient has nephrologist on peritoneal dialysis, labs sent to Dr. Ward Chatters nephrology as well. When is his next dialysis ?  Anemia, not worsening slightly improved, kidney function worse than previous, he should follow up with nephrology.   Neutrophils mild elevation - WBC is within normal limits, finish Doxycycline antibiotics.  Labs consistent with previous labs.  Keep follow up's with neurology and pulmonary and once seen them and improved will need outpatient GI consult per hospital discharge. Verify no active rectal bleeding ?

## 2019-11-30 NOTE — Telephone Encounter (Signed)
Patient was notified of results. Patient has dialysis appointment 12/03/2019. Also patient states he is not having any rectal bleeding. Patient states he had an episode where his bp dropped down to 114/46. Patient states he sat down for a while and his bp came back up to 130/56

## 2019-11-30 NOTE — Progress Notes (Signed)
BNP within normal limits.

## 2019-12-03 ENCOUNTER — Telehealth: Payer: Self-pay

## 2019-12-03 NOTE — Telephone Encounter (Signed)
Patient was advised.  

## 2019-12-03 NOTE — Telephone Encounter (Signed)
Received short term disability forms from [employer]. Forms placed in Dr. Maralyn Sago pick up box. Please advise. Thanks TNP

## 2019-12-06 ENCOUNTER — Ambulatory Visit: Payer: Commercial Managed Care - PPO | Admitting: Pulmonary Disease

## 2019-12-06 NOTE — Progress Notes (Deleted)
@Patient  ID: Patrick Hurst, male    DOB: 08-Dec-1955, 64 y.o.   MRN: [id number]  No chief complaint on file.   Referring provider: [name], MD  HPI:   PMH:  Smoker/ Smoking History:  Maintenance:   Pt of:   12/06/2019  - Visit   64 year old male former smoker initially consulted with our practice when hospitalized back in July/2021.  Patient was initially admitted on 11/15/2019 and discharged on 11/23/2019.  An excerpt of that discharge summary is listed below:  Admit date: 11/15/2019 Discharge date: 11/23/2019  Admitted From: Home Disposition: Home  Recommendations for Outpatient Follow-up:  1. Follow up with PCP in 1 week 2. Follow up with Pulmonology and Gastroenterology 3. Please follow up on the following pending results: None  Home Health: None Equipment/Devices: Oxygen  Discharge Condition: Stable CODE STATUS: Full code Diet recommendation: Renal diet   Brief/Interim Summary:  Admission HPI written by Para Skeans, MD   Chief Complaint: Shortness of breath .  NFA:OZHYQ L Mortonis a 64 y.o.malewith medical history significant for diastolicCHF, end-stage renal diseasecurrently on peritoneal dialysis, sleep apnea, atrial fibrillation, diabetes who presents with complaints of shortness of breath.He has been having to use nasal cannula oxygen over the last week, which has progressed since past few months.Occasionally he has had chills patient reports he was started on antibiotics by his PCP recently for possible pneumonia however no significant improvement. Does report mild cough. No nausea vomiting or diaphoresis. No chest pain. No sick contacts reported.Pt reports hemoptysis since yesterday and cough. Pt was hypoxic at 89% on ra on arrival and is over 90% on 2 L [place].   Hospital course:  Community acquired pneumonia Multifocal pneumonia RVP and COVID-19 negative. MRSA pcr negative. Pulmonology consulted for management. Patient  managed on Ceftriaxone and doxycycline significantly improved prior to discharge. Pulmonology recommended to discharge on 4 more days of doxycycline.  Acute respiratory failure with hypoxia Secondary to above with concern for concomitant fluid overload from ESRD requiring PD and HD.Improving. On room air today but in low 90s at rest. Patient required oxygen with ambulation and was set up for home oxygen. Can wean to room air as an outpatient. Recommend outpatient pulmonology follow-up.  Right hepatic lobe lesion Unsure of etiology. GI consulted. Concern for HCCbut AFP undetectable. MRI abdomen significant for benign focal fatty deposition.  Cirrhosis of liver Secondary to NASH per GI in setting of metabolic syndrome. Negative for hepatitis B and C. GI recommended outpatient EGD, low sodium diet  Diabetes mellitus, type 2 Hemoglobin A1C of 5.8% and well controlled. History of diabetic retinopathy and peripheral edema. Continue home regimen.  Hypothyroidism Continue Synthroid  Acute on chronic diastolic heart failure Management with Lasix, HD an dPD. Transthoracic Echocardiogram significant for preserved EF and grade 2 diastolic dysfunction. Fluid status appears improved.  Anemia of chronic disease Patient required 1 unit of PRBC for a hemoglobin of 6.6 and is now stable  ESRD on PD/intermittent HD Patient underwent AV fistula creation by vascular surgery on 7/8. Site appears non-infected. PD while inpatient. Received one session of intermittent HD.  Right leg pain Unsure of etiology. Does not look significantly concerning for infectious etiology. Possibly reactive. Ultrasound of right leg without evidence of abscess and x-ray without evidence of fracture.  Paroxysmal atrial fibrillation Continue amiodarone and metoprolol  Acquired thrombophilia Previously on Eliquis for stroke prophylaxis which is held secondary to concern for GI bleed  COPD Stable.  Questionaires /  Pulmonary Flowsheets:  ACT:  No flowsheet data found.  MMRC: No flowsheet data found.  Epworth:  Results of the Epworth flowsheet 04/25/2019  Sitting and reading 1  Watching TV 1  Sitting, inactive in a public place (e.g. a theatre or a meeting) 0  As a passenger in a car for an hour without a break 0  Lying down to rest in the afternoon when circumstances permit 1  Sitting and talking to someone 0  Sitting quietly after a lunch without alcohol 0  In a car, while stopped for a few minutes in traffic 0  Total score 3    Tests:   FENO:  No results found for: NITRICOXIDE  PFT: No flowsheet data found.  WALK:  SIX MIN WALK 03/20/2019  2 Minute Oxygen Saturation % 89  2 Minute HR 100  4 Minute Oxygen Saturation % 89  4 Minute HR 90  6 Minute Oxygen Saturation % 97  6 Minute HR 104    Imaging: DG Chest 2 View  Result Date: 11/29/2019 CLINICAL DATA:  64 year old male with respiratory failure. EXAM: CHEST - 2 VIEW COMPARISON:  Chest radiograph dated 11/21/2019. FINDINGS: Right-sided dialysis catheter with tip at the cavoatrial junction in similar position. Right mid lung field linear atelectasis/scarring. No focal consolidation, pleural effusion, pneumothorax. The cardiac silhouette is within limits. Atherosclerotic calcification of the aorta. No acute osseous pathology. Degenerative changes of the spine. IMPRESSION: No active cardiopulmonary disease. Electronically Signed   By: Anner Crete M.D.   On: 11/29/2019 16:41   DG Chest 2 View  Result Date: 11/18/2019 CLINICAL DATA:  Acute respiratory failure with hypoxia. EXAM: CHEST - 2 VIEW COMPARISON:  November 15, 2019. FINDINGS: The heart size and mediastinal contours are within normal limits. No pneumothorax or pleural effusion is noted. Right internal jugular dialysis catheter is unchanged in position. Bilateral perihilar opacities are noted, left greater than right, consistent with pneumonia. The visualized skeletal  structures are unremarkable. IMPRESSION: Grossly stable bilateral perihilar opacities, left greater than right, consistent with pneumonia. Electronically Signed   By: Marijo Conception M.D.   On: 11/18/2019 16:11   DG Chest 2 View  Result Date: 11/15/2019 CLINICAL DATA:  Short of breath, pneumonia EXAM: CHEST - 2 VIEW COMPARISON:  11/12/2019 FINDINGS: Frontal and lateral views of the chest demonstrate persistent bilateral perihilar airspace disease, slightly improved on the right since prior study but with significant progression on the left. There is no effusion or pneumothorax. Cardiac silhouette is enlarged but stable. Stable right internal jugular dialysis catheter. No acute bony abnormalities. IMPRESSION: 1. Bilateral perihilar airspace disease, left greater than right. There has been progression in the left since prior study. Differential includes bilateral pneumonia versus asymmetric edema. Electronically Signed   By: Randa Ngo M.D.   On: 11/15/2019 15:37   DG Chest 2 View  Result Date: 11/12/2019 CLINICAL DATA:  Shortness of breath for 4-5 days. EXAM: CHEST - 2 VIEW COMPARISON:  Single-view of the chest 03/07/2019. FINDINGS: Patchy airspace disease in the mid and lower lung zones bilaterally is worse on the left. Heart size is normal. No pneumothorax or pleural effusion. Right IJ approach dialysis catheter noted. IMPRESSION: Patchy bilateral airspace disease consistent with pneumonia. Electronically Signed   By: Inge Rise M.D.   On: 11/12/2019 15:34   DG Ankle Complete Right  Result Date: 11/23/2019 CLINICAL DATA:  Right ankle pain for several days, no known injury, initial encounter EXAM: RIGHT ANKLE - COMPLETE 3+ VIEW COMPARISON:  10/09/2015 FINDINGS: No  acute fracture or dislocation is noted. Considerable degenerative changes are noted in the tarsal bones. Associated soft tissue swelling noted. IMPRESSION: Tarsal degenerative change without acute abnormality. Electronically Signed    By: Inez Catalina M.D.   On: 11/23/2019 11:52   CT CHEST WO CONTRAST  Result Date: 11/19/2019 CLINICAL DATA:  Dyspnea, chronic, diastolic heart failure, end-stage renal disease EXAM: CT CHEST WITHOUT CONTRAST TECHNIQUE: Multidetector CT imaging of the chest was performed following the standard protocol without IV contrast. COMPARISON:  06/19/2019 FINDINGS: Cardiovascular: Extensive coronary artery calcification is noted, predominantly within the left main and left anterior descending coronary artery. Cardiac size is at the upper limits of normal. No pericardial effusion. Central pulmonary arteries are enlarged in keeping with changes of pulmonary arterial hypertension. The thoracic aorta is of normal caliber. Right internal jugular central venous catheter tip is noted within the right atrium. Mediastinum/Nodes: No pathologic thoracic adenopathy. Lungs/Pleura: Since the prior examination, there has developed extensive bilateral asymmetric airspace infiltrates, more severe within the superior segment of the left lower lobe, likely infectious or inflammatory in the acute setting. No superimposed significant pulmonary edema. No pneumothorax or pleural effusion. The central airways are widely patent. Upper Abdomen: Cholecystectomy has been performed. Since the prior examination, there has developed a multilobulated low-attenuation lesion within segment 4 B of the liver which is indeterminate. This may represent a developing hepatic mass or complex fluid collection such as a hepatic abscess. This measures roughly 4.1 x 6.9 cm on axial image # 134/2. A trace amount of perihepatic ascites has developed. Musculoskeletal: No lytic or blastic bone lesion is identified. IMPRESSION: Interval development of multifocal pulmonary infiltrates, most in keeping with atypical infection in the acute setting. Interval development of a multilobulated indeterminate lesion within the right hepatic lobe. This would be better assessed, at  least initially, with dedicated hepatics sonography or contrast enhanced CT examination. Coronary artery calcification Aortic Atherosclerosis (ICD10-I70.0). Electronically Signed   By: Fidela Salisbury MD   On: 11/19/2019 19:36   MR ABDOMEN WO CONTRAST  Result Date: 11/23/2019 CLINICAL DATA:  Evaluate indeterminate liver lesion EXAM: MRI ABDOMEN WITHOUT CONTRAST TECHNIQUE: Multiplanar multisequence MR imaging was performed without the administration of intravenous contrast. COMPARISON:  Ultrasound abdomen 11/20/2019 FINDINGS: Lower chest: No acute abnormality. Hepatobiliary: Within segment 4B and segment 5, adjacent to the gallbladder fossa, there are several areas loss of signal on the out of phase sequence which correspond to the focal areas of low attenuation seen on CT from 11/19/2019 and ultrasound from 11/20/2019. This is consistent with benign focal fatty deposition. Cholecystectomy. No significant bile duct dilatation. Pancreas: No mass, inflammatory changes, or other parenchymal abnormality identified. Spleen: The spleen measures 15.3 by 12.7 x 6.4 cm (volume = 650 cm^3). No focal splenic lesion identified. Adrenals/Urinary Tract: Normal appearance of the adrenal glands. Bilateral kidney cysts are noted. No hydronephrosis. Stomach/Bowel: Visualized portions within the abdomen are unremarkable. Vascular/Lymphatic: No pathologically enlarged lymph nodes identified. No abdominal aortic aneurysm demonstrated. Other:  No free fluid or fluid collections Musculoskeletal: No suspicious bone lesions identified. IMPRESSION: 1. There are several areas of loss of signal on the out of phase sequence which correspond to the focal areas of low attenuation seen on CT from 11/19/2019 and ultrasound from 11/20/2019. This is consistent with benign focal fatty deposition. Within the limitations of unenhanced technique there are no suspicious liver lesions. 2. Splenomegaly. 3. Bilateral kidney cysts. Electronically Signed    By: Kerby Moors M.D.   On: 11/23/2019 07:25  DG Chest Port 1 View  Result Date: 11/21/2019 CLINICAL DATA:  Abnormal chest radiograph EXAM: PORTABLE CHEST 1 VIEW COMPARISON:  CT 11/19/2019, radiograph 11/18/2019 FINDINGS: Dual lumen right IJ approach dialysis catheter tip terminates at the superior cavoatrial junction in stable position from prior. Telemetry leads overlie the chest. Redemonstration of the bilateral patchy airspace opacities most pronounced in the left mid to lower lung and right lung base. Stable bandlike opacity in the right mid lung likely reflecting some subsegmental atelectasis and/or scarring as was present on comparison CT. No pneumothorax or visible effusion. Cardiomediastinal contours are stable. No acute osseous or soft tissue abnormality. Degenerative changes are present in the imaged spine and shoulders. IMPRESSION: Persistent bilateral patchy airspace opacities, perhaps mildly improved in the left mid lung but with accentuation in the bases likely related to diminished volumes. Stable satisfactory position of a right IJ approach dual lumen central venous catheter. Electronically Signed   By: Lovena Le M.D.   On: 11/21/2019 18:35   ECHOCARDIOGRAM COMPLETE  Result Date: 11/16/2019    ECHOCARDIOGRAM REPORT   Patient Name:   MICHIO THIER Date of Exam: 11/16/2019 Medical Rec #:  [id number]      Height:       71.0 in Accession #:    [id number]     Weight:       308.6 lb Date of Birth:  1955/12/23      BSA:          2.536 m Patient Age:    38 years       BP:           128/55 mmHg Patient Gender: M              HR:           74 bpm. Exam Location:  [facility] Procedure: 2D Echo, Cardiac Doppler and Color Doppler Indications:     Dyspnea 786.09  History:         Patient has prior history of Echocardiogram examinations, most                  recent 03/06/2019. CHF; Risk Factors:Sleep Apnea.  Sonographer:     Sherrie Sport RDCS (AE) Referring Phys:  Theola Sequin Diagnosing Phys: Ida Rogue MD IMPRESSIONS  1. Left ventricular ejection fraction, by estimation, is 60 to 65%. The left ventricle has normal function. The left ventricle has no regional wall motion abnormalities. There is mild left ventricular hypertrophy. Left ventricular diastolic parameters are consistent with Grade II diastolic dysfunction (pseudonormalization).  2. Right ventricular systolic function is normal. The right ventricular size is normal. There is normal pulmonary artery systolic pressure. The estimated right ventricular systolic pressure is 44.8 mmHg.  3. The aortic valve is moderately calcified. Mild to moderate aortic valve sclerosis/calcification is present, without any evidence of aortic stenosis.  4. Left atrial size was mildly dilated. FINDINGS  Left Ventricle: Left ventricular ejection fraction, by estimation, is 60 to 65%. The left ventricle has normal function. The left ventricle has no regional wall motion abnormalities. The left ventricular internal cavity size was normal in size. There is  mild left ventricular hypertrophy. Left ventricular diastolic parameters are consistent with Grade II diastolic dysfunction (pseudonormalization). Right Ventricle: The right ventricular size is normal. No increase in right ventricular wall thickness. Right ventricular systolic function is normal. There is normal pulmonary artery systolic pressure. The tricuspid regurgitant velocity is 1.97 m/s, and  with an assumed right atrial pressure  of 10 mmHg, the estimated right ventricular systolic pressure is 26.9 mmHg. Left Atrium: Left atrial size was mildly dilated. Right Atrium: Right atrial size was normal in size. Pericardium: There is no evidence of pericardial effusion. Mitral Valve: The mitral valve is normal in structure. Normal mobility of the mitral valve leaflets. Mild mitral annular calcification. Mild mitral valve regurgitation. No evidence of mitral valve stenosis. Tricuspid Valve: The tricuspid valve is normal in  structure. Tricuspid valve regurgitation is not demonstrated. No evidence of tricuspid stenosis. Aortic Valve: The aortic valve is normal in structure. Aortic valve regurgitation is not visualized. Mild to moderate aortic valve sclerosis/calcification is present, without any evidence of aortic stenosis. Aortic valve mean gradient measures 8.5 mmHg. Aortic valve peak gradient measures 16.6 mmHg. Aortic valve area, by VTI measures 2.29 cm. Pulmonic Valve: The pulmonic valve was normal in structure. Pulmonic valve regurgitation is not visualized. No evidence of pulmonic stenosis. Aorta: The aortic root is normal in size and structure. Venous: The inferior vena cava is normal in size with greater than 50% respiratory variability, suggesting right atrial pressure of 3 mmHg. IAS/Shunts: No atrial level shunt detected by color flow Doppler.  LEFT VENTRICLE PLAX 2D LVIDd:         6.18 cm  Diastology LVIDs:         3.40 cm  LV e' lateral:   14.70 cm/s LV PW:         1.54 cm  LV E/e' lateral: 7.7 LV IVS:        0.93 cm  LV e' medial:    5.44 cm/s LVOT diam:     2.30 cm  LV E/e' medial:  20.8 LV SV:         97 LV SV Index:   38 LVOT Area:     4.15 cm  RIGHT VENTRICLE RV Basal diam:  3.25 cm RV S prime:     18.40 cm/s TAPSE (M-mode): 3.7 cm LEFT ATRIUM            Index       RIGHT ATRIUM           Index LA diam:      5.20 cm  2.05 cm/m  RA Area:     25.00 cm LA Vol (A2C): 187.0 ml 73.75 ml/m RA Volume:   79.50 ml  31.35 ml/m LA Vol (A4C): 112.0 ml 44.17 ml/m  AORTIC VALVE                    PULMONIC VALVE AV Area (Vmax):    2.10 cm     PV Vmax:        1.01 m/s AV Area (Vmean):   2.07 cm     PV Peak grad:   4.1 mmHg AV Area (VTI):     2.29 cm     RVOT Peak grad: 8 mmHg AV Vmax:           203.50 cm/s AV Vmean:          137.500 cm/s AV VTI:            0.422 m AV Peak Grad:      16.6 mmHg AV Mean Grad:      8.5 mmHg LVOT Vmax:         103.00 cm/s LVOT Vmean:        68.500 cm/s LVOT VTI:          0.233 m LVOT/AV VTI ratio:  0.55  AORTA Ao Root diam: 3.70 cm MITRAL VALVE                TRICUSPID VALVE MV Area (PHT): 3.20 cm     TR Peak grad:   15.5 mmHg MV Decel Time: 237 msec     TR Vmax:        197.00 cm/s MV E velocity: 113.00 cm/s MV A velocity: 100.00 cm/s  SHUNTS MV E/A ratio:  1.13         Systemic VTI:  0.23 m                             Systemic Diam: 2.30 cm Ida Rogue MD Electronically signed by Ida Rogue MD Signature Date/Time: 11/16/2019/2:31:17 PM    Final    [place] RT LOWER EXTREM LTD SOFT TISSUE NON VASCULAR  Result Date: 11/22/2019 CLINICAL DATA:  Fluctuant mass on the medial aspect of the right leg EXAM: ULTRASOUND RIGHT LOWER EXTREMITY LIMITED TECHNIQUE: Ultrasound examination of the lower extremity soft tissues was performed in the area of clinical concern. COMPARISON:  None. FINDINGS: The patient's palpable area of concern corresponds to an area of subcutaneous edema. There is no well-formed fluid collection or abscess. There may be some mild overlying skin thickening. There is no mass. IMPRESSION: Lower extremity edema without evidence for an abscess, fluid collection, or mass. Electronically Signed   By: Constance Holster M.D.   On: 11/22/2019 19:13   US Abdomen Limited RUQ  Result Date: 11/20/2019 CLINICAL DATA:  Liver lesion. EXAM: ULTRASOUND ABDOMEN LIMITED RIGHT UPPER QUADRANT COMPARISON:  Chest CT 11/19/2019. Right upper quadrant abdominal ultrasound 05/08/2016. FINDINGS: Gallbladder: Surgically absent. Common bile duct: Diameter: 4 mm Liver: Background increased parenchymal echogenicity diffusely. Approximately 7.8 x 5.3 x 7.6 cm lesion anteriorly in the liver with heterogeneous posterior shadowing and a small amount of internal vascularity. The lesion demonstrates heterogeneous echogenicity but is primarily hyperechoic. Portal vein is patent on color Doppler imaging with normal direction of blood flow towards the liver. Other: None. IMPRESSION: Indeterminate solid liver mass. When the patient  is clinically stable and able to follow directions and hold their breath (preferably as an outpatient), further evaluation with a contrast-enhanced abdominal MRI is recommended. Electronically Signed   By: Logan Bores M.D.   On: 11/20/2019 11:53    Lab Results:  CBC    Component Value Date/Time   WBC 9.8 11/29/2019 1559   WBC 7.7 11/21/2019 0439   RBC 2.84 (L) 11/29/2019 1559   RBC 2.66 (L) 11/21/2019 0439   HGB 8.5 (L) 11/29/2019 1559   HCT 24.3 (L) 11/29/2019 1559   PLT 138 (L) 11/29/2019 1559   MCV 86 11/29/2019 1559   MCV 85 05/04/2013 1240   MCH 29.9 11/29/2019 1559   MCH 30.5 11/21/2019 0439   MCHC 35.0 11/29/2019 1559   MCHC 33.5 11/21/2019 0439   RDW 14.4 11/29/2019 1559   RDW 15.3 (H) 05/04/2013 1240   LYMPHSABS 0.7 11/29/2019 1559   LYMPHSABS 1.1 05/04/2013 1240   MONOABS 0.7 11/21/2019 0439   MONOABS 0.6 05/04/2013 1240   EOSABS 0.1 11/29/2019 1559   EOSABS 0.1 05/04/2013 1240   BASOSABS 0.1 11/29/2019 1559   BASOSABS 0.0 05/04/2013 1240    BMET    Component Value Date/Time   NA 140 11/29/2019 1559   NA 137 05/04/2013 1240   K 4.5 11/29/2019 1559   K 4.4 05/04/2013 1240   CL 92 (L)  11/29/2019 1559   CL 100 05/04/2013 1240   CO2 19 (L) 11/29/2019 1559   CO2 32 05/04/2013 1240   GLUCOSE 94 11/29/2019 1559   GLUCOSE 257 (H) 11/21/2019 0439   GLUCOSE 123 (H) 05/04/2013 1240   BUN 107 (HH) 11/29/2019 1559   BUN 25 (H) 05/04/2013 1240   CREATININE 12.49 (H) 11/29/2019 1559   CREATININE 0.95 05/04/2013 1240   CALCIUM 8.8 11/29/2019 1559   CALCIUM 9.1 05/04/2013 1240   GFRNONAA 4 (L) 11/29/2019 1559   GFRNONAA >60 05/04/2013 1240   GFRAA 4 (L) 11/29/2019 1559   GFRAA >60 05/04/2013 1240    BNP    Component Value Date/Time   BNP 29.1 11/29/2019 1559   BNP 221.3 (H) 11/15/2019 1728    ProBNP No results found for: PROBNP  Specialty Problems      Pulmonary Problems   Allergic rhinitis   Obstructive sleep apnea    Severe. VPAP ordered 2014       Shortness of breath   COPD (chronic obstructive pulmonary disease) (HCC)    Manifests as gas trapping      Respiratory failure with hypoxia (HCC)   Dependence on nocturnal oxygen therapy    2 LITERS WITH BIPAP      Pneumonia due to infectious organism      Allergies  Allergen Reactions  . Mucinex [Guaifenesin Er] Swelling    Throat swelling, increases heart rate  . Levaquin [Levofloxacin] Palpitations    Immunization History  Administered Date(s) Administered  . Hepatitis A, Adult 12/08/2016, 06/10/2017  . Hepatitis B, adult 12/08/2016, 01/10/2017, 06/10/2017, 06/08/2019, 07/06/2019, 08/22/2019  . Influenza, Seasonal, Injecte, Preservative Fre 03/13/2007, 01/22/2008, 03/17/2009, 03/18/2010, 02/01/2011  . Influenza,inj,Quad PF,6+ Mos 02/08/2013, 01/18/2014, 02/12/2015, 02/16/2016, 06/10/2017, 02/20/2018, 03/03/2019  . Influenza-Unspecified 02/05/2019  . PPD Test 05/21/2019, 06/06/2019, 06/13/2019  . Pneumococcal Polysaccharide-23 05/03/2000, 06/23/2009, 02/05/2019, 03/09/2019, 09/18/2019  . Td 12/08/2016  . Tdap 12/30/2006    Past Medical History:  Diagnosis Date  . Acute on chronic diastolic CHF (congestive heart failure) ([place]) 06/21/2018  . Acute respiratory failure with hypoxia ([place]) 06/21/2018  . Bell palsy 02/12/2015  . Carpal tunnel syndrome 02/12/2015  . Dependence on nocturnal oxygen therapy    2 LITERS WITH BIPAP  . Depression   . Gastric ulcer   . GERD (gastroesophageal reflux disease)   . Gout   . History of chicken pox   . Multifocal pneumonia 03/04/2019  . Sleep apnea treated with nocturnal BiPAP     Tobacco History: Social History   Tobacco Use  Smoking Status Former Smoker  . Packs/day: 1.00  . Years: 15.00  . Pack years: 15.00  . Types: Cigarettes  . Quit date: 05/03/1978  . Years since quitting: 41.6  Smokeless Tobacco Never Used  Tobacco Comment   started smoking at age 70   Counseling given: Not Answered Comment: started smoking at  age 3   Continue to not smoke  Outpatient Encounter Medications as of 12/06/2019  Medication Sig  . albuterol (PROAIR HFA) 108 (90 Base) MCG/ACT inhaler Inhale 2 puffs into the lungs every 6 (six) hours as needed for wheezing or shortness of breath.  Marland Kitchen amiodarone (PACERONE) 200 MG tablet Take 200 mg by mouth daily.  Marland Kitchen amitriptyline (ELAVIL) 25 MG tablet TAKE 1 TABLET AT BEDTIME (Patient taking differently: Take 25 mg by mouth at bedtime. )  . amLODipine (NORVASC) 5 MG tablet Take 1 tablet (5 mg total) by mouth daily.  . Ascorbic Acid (VITAMIN C) 1000  MG tablet Take 1,000 mg by mouth 2 (two) times daily.   Marland Kitchen atorvastatin (LIPITOR) 40 MG tablet TAKE 1 TABLET DAILY  . BD INSULIN SYRINGE U/F 31G X 5/16" 1 ML MISC   . calcitRIOL (ROCALTROL) 0.5 MCG capsule Take 0.5 mcg by mouth daily.  . cetirizine (ZYRTEC) 10 MG tablet Take 10 mg by mouth daily.   Marland Kitchen ELIQUIS 2.5 MG TABS tablet Take 2.5 mg by mouth 2 (two) times daily.   . fluticasone furoate-vilanterol (BREO ELLIPTA) 100-25 MCG/INH AEPB Inhale 1 puff into the lungs daily.  Marland Kitchen gentamicin cream (GARAMYCIN) 0.1 % Apply 1 application topically daily.  . hydrALAZINE (APRESOLINE) 25 MG tablet TAKE 1 TABLET THREE TIMES A DAY (Patient not taking: Reported on 11/29/2019)  . insulin regular human CONCENTRATED (HUMULIN R) 500 UNIT/ML injection Inject up to 25 units twice a day, or as directed by physician (Patient taking differently: Inject 50 Units into the skin 2 (two) times daily with a meal. 60 UNITS IN THE AM & 50 UNITS AT NIGHT)  . ipratropium-albuterol (DUONEB) 0.5-2.5 (3) MG/3ML SOLN INHALE 3 ML BY NEBULIZATION EVERY 4 HOURS AS NEEDED (Patient taking differently: Inhale 3 mLs into the lungs daily. )  . lactulose (CHRONULAC) 10 GM/15ML solution Take 30 mLs by mouth daily.  Marland Kitchen levothyroxine (SYNTHROID) 100 MCG tablet TAKE 1 TABLET DAILY (Patient taking differently: Take 100 mcg by mouth daily before breakfast. )  . metoprolol tartrate (LOPRESSOR) 50 MG  tablet Take 0.5 tablets (25 mg total) by mouth 2 (two) times daily.  . pantoprazole (PROTONIX) 40 MG tablet TAKE 1 TABLET TWICE A DAY (Patient taking differently: Take 40 mg by mouth daily. )  . testosterone cypionate (DEPO-TESTOSTERONE) 200 MG/ML injection Inject 1 mL (200 mg total) into the muscle every 14 (fourteen) days.  Marland Kitchen torsemide (DEMADEX) 20 MG tablet Take 40 mg by mouth daily.  Marland Kitchen ULORIC 80 MG TABS Take 80 mg by mouth daily.    No facility-administered encounter medications on file as of 12/06/2019.     Review of Systems  Review of Systems   Physical Exam  There were no vitals taken for this visit.  Wt Readings from Last 5 Encounters:  11/29/19 (!) 293 lb 6.4 oz (133.1 kg)  11/22/19 (!) 301 lb 13 oz (136.9 kg)  11/12/19 (!) 309 lb 6.4 oz (140.3 kg)  11/08/19 (!) 301 lb (136.5 kg)  11/02/19 (!) 312 lb (141.5 kg)    BMI Readings from Last 5 Encounters:  11/29/19 40.92 kg/m  11/22/19 42.09 kg/m  11/12/19 43.15 kg/m  11/08/19 41.98 kg/m  11/02/19 43.52 kg/m     Physical Exam    Assessment & Plan:   No problem-specific Assessment & Plan notes found for this encounter.    No follow-ups on file.   Lauraine Rinne, NP 12/06/2019   This appointment required *** minutes of patient care (this includes precharting, chart review, review of results, face-to-face care, etc.).

## 2019-12-06 NOTE — Telephone Encounter (Signed)
Received attending provider statement form from [facility]. Forms are in Dr. Maralyn Sago box. Please advise. Thanks TNP

## 2019-12-10 ENCOUNTER — Telehealth: Payer: Self-pay | Admitting: Family Medicine

## 2019-12-10 NOTE — Telephone Encounter (Signed)
Working on it. Should be ready by [place]

## 2019-12-10 NOTE — Telephone Encounter (Signed)
Patient wanting a call back regarding his FMLA/disablity  paperwork.  He wants to know if it's completed or what has been done.  Please call patient back at [phone].  Thanks, [employer]

## 2019-12-11 NOTE — Telephone Encounter (Signed)
Tried calling patient. Left message to call back on his cell phone and his wife's phone. Alda for [facility] triage to speak with patient and collect requested information, then route message back to office.

## 2019-12-11 NOTE — Telephone Encounter (Addendum)
Please check with patient and verify all dates that he missed work. Also need to know if he had any restrictions when he returned to work.

## 2019-12-11 NOTE — Telephone Encounter (Signed)
Patient advised.

## 2019-12-12 NOTE — Telephone Encounter (Signed)
Forms completed and faxed. TNP

## 2019-12-12 NOTE — Telephone Encounter (Signed)
Phone call to pt.  Reported his last day at work was 7/19, and has not been able to work since then.  Stated he is still short of breath.  Pt. Reported he uses O2 intermittently, but mainly at night.  If he moves around much without the O2, his oxygen saturation will drop to the 80"s.  Reported he just started seeing a Pulmonologist, Dr. Lanney Gins, at [facility].  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  He has not discussed returning to work with his employer.  Is not aware of any type of light duty work that he can do, when he resumes work.  Will send update to Dr. Caryn Section.

## 2019-12-13 ENCOUNTER — Ambulatory Visit: Payer: Self-pay | Admitting: Adult Health

## 2019-12-13 NOTE — Progress Notes (Signed)
Patient ID: Patrick Hurst, male    DOB: [birth year], 64 y.o.   MRN: [id number]  HPI  Mr Righi is a 64 y/o male with a history of DM, hyperlipidemia, HTN, CKD, thyroid disease, GERD, gout, anemia, depression, obstructive sleep apnea, bell's palsy, remote tobacco use and chronic heart failure.   Echo report from 11/16/19 reviewed and showed an EF of 60-65% along with mild LVH / LAE. Echo report from 03/06/2019 reviewed and showed an EF of 55-60% along with minimal MR and trivial TR. Echo report from 06/23/2018 reviewed and showed an EF of 50-55%.  Admitted 11/15/19 due to shortness of breath due to pneumonia. GI and pulmonology consults obtained. Antibiotics given. MRI abdomen significant for benign focal fatty deposition. Dialysis completed while inpatient. Right leg [place] negative for abscess and xray negative for fracture. Discharged after 8 days.   He presents today for a follow-up visit with a chief complaint of moderate shortness of breath upon minimal exertion. He describes this as chronic in nature having been present for several years. Has has recent inhaler changes and feels like he can do a little more than he used to. He has associated fatigue, light-headedness, pedal edema and rhinorrhea along with this. He denies any difficulty sleeping, abdominal distention, palpitations, chest pain, wheezing, cough or weight gain.    Now doing home peritoneal dialysis and says that they are still adjusting how many bags he does. Has occasionally gone in for hemodialysis due to swelling and weight gain.   Is concerned about some tender areas on the sides of both calves that also have some redness. Had right leg US done during admission last month which was negative. Has appt w/ vascular next week.     Past Medical History:  Diagnosis Date  . Acute on chronic diastolic CHF (congestive heart failure) ([place]) 06/21/2018  . Acute respiratory failure with hypoxia ([place]) 06/21/2018  . Bell palsy 02/12/2015  .  Carpal tunnel syndrome 02/12/2015  . Dependence on nocturnal oxygen therapy    2 LITERS WITH BIPAP  . Depression   . Gastric ulcer   . GERD (gastroesophageal reflux disease)   . Gout   . History of chicken pox   . Multifocal pneumonia 03/04/2019  . Sleep apnea treated with nocturnal BiPAP    Past Surgical History:  Procedure Laterality Date  . AV FISTULA PLACEMENT Left 11/08/2019   Procedure: ARTERIOVENOUS (AV) FISTULA CREATION (RADIALCEPHALIC);  Surgeon: Algernon Huxley, MD;  Location: [facility] ORS;  Service: Vascular;  Laterality: Left;  . CATARACT EXTRACTION Bilateral 2014 and 2015  . CHOLECYSTECTOMY  04/28/2011   Laproscopic; Dr. Pat Patrick  . DIALYSIS/PERMA CATHETER INSERTION N/A 05/24/2019   Procedure: DIALYSIS/PERMA CATHETER INSERTION;  Surgeon: Algernon Huxley, MD;  Location: [place] [facility];  Service: Cardiovascular;  Laterality: N/A;  . DIALYSIS/PERMA CATHETER INSERTION N/A 10/09/2019   Procedure: DIALYSIS/PERMA CATHETER INSERTION;  Surgeon: Katha Cabal, MD;  Location: [place] [facility];  Service: Cardiovascular;  Laterality: N/A;  . EYE SURGERY    . GALLBLADDER SURGERY    . LASIK Bilateral 2019   medical  . SHOULDER SURGERY Right 2012   Dr. Leanor Kail   Family History  Problem Relation Age of Onset  . COPD Mother   . Cancer Mother   . Heart disease Father    Social History   Tobacco Use  . Smoking status: Former Smoker    Packs/day: 1.00    Years: 15.00    Pack years: 15.00  Types: Cigarettes    Quit date: 05/03/1978    Years since quitting: 41.6  . Smokeless tobacco: Never Used  . Tobacco comment: started smoking at age 38  Substance Use Topics  . Alcohol use: No    Alcohol/week: 0.0 standard drinks   Allergies  Allergen Reactions  . Mucinex [Guaifenesin Er] Swelling    Throat swelling, increases heart rate  . Levaquin [Levofloxacin] Palpitations   Prior to Admission medications   Medication Sig Start Date End Date Taking? Authorizing Provider   amiodarone (PACERONE) 200 MG tablet Take 200 mg by mouth daily. 08/16/19  Yes [provider]  amitriptyline (ELAVIL) 25 MG tablet TAKE 1 TABLET AT BEDTIME Patient taking differently: Take 25 mg by mouth at bedtime.  07/26/19  Yes [name], MD  amLODipine (NORVASC) 5 MG tablet Take 1 tablet (5 mg total) by mouth daily. 11/12/19  Yes [name], MD  Ascorbic Acid (VITAMIN C) 1000 MG tablet Take 1,000 mg by mouth 2 (two) times daily.    Yes [provider]  atorvastatin (LIPITOR) 40 MG tablet TAKE 1 TABLET DAILY 10/15/19  Yes [name], MD  BD INSULIN SYRINGE U/F 31G X 5/16" 1 ML MISC  11/18/19  Yes [provider]  Budeson-Glycopyrrol-Formoterol (BREZTRI AEROSPHERE) 160-9-4.8 MCG/ACT AERO Inhale 2 puffs into the lungs in the morning and at bedtime.   Yes [provider]  calcitRIOL (ROCALTROL) 0.5 MCG capsule Take 0.5 mcg by mouth daily. 12/29/18  Yes [provider]  cetirizine (ZYRTEC) 10 MG tablet Take 10 mg by mouth daily.    Yes [provider]  ELIQUIS 2.5 MG TABS tablet Take 2.5 mg by mouth 2 (two) times daily.  03/23/19  Yes [provider]  gentamicin cream (GARAMYCIN) 0.1 % Apply 1 application topically daily. 09/22/19  Yes [provider]  insulin regular human CONCENTRATED (HUMULIN R) 500 UNIT/ML injection Inject up to 25 units twice a day, or as directed by physician Patient taking differently: Inject 50 Units into the skin 2 (two) times daily with a meal. 60 UNITS IN THE AM & 50 UNITS AT NIGHT 01/05/19  Yes Fisher, Kirstie Peri, MD  Ipratropium-Albuterol (COMBIVENT RESPIMAT) 20-100 MCG/ACT AERS respimat Inhale 1 puff into the lungs every 6 (six) hours.   Yes [provider]  Ipratropium-Albuterol (COMBIVENT RESPIMAT) 20-100 MCG/ACT AERS respimat Inhale 2 puffs into the lungs every 4 (four) hours as needed for wheezing.   Yes [provider]  lactulose (CHRONULAC) 10 GM/15ML solution Take  30 mLs by mouth daily. 09/10/19  Yes [provider]  levothyroxine (SYNTHROID) 100 MCG tablet TAKE 1 TABLET DAILY Patient taking differently: Take 100 mcg by mouth daily before breakfast.  08/01/19  Yes Fisher, Kirstie Peri, MD  metoprolol tartrate (LOPRESSOR) 50 MG tablet Take 0.5 tablets (25 mg total) by mouth 2 (two) times daily. 03/23/19  Yes [name], MD  pantoprazole (PROTONIX) 40 MG tablet TAKE 1 TABLET TWICE A DAY Patient taking differently: Take 40 mg by mouth daily.  02/10/19  Yes [name], MD  testosterone cypionate (DEPO-TESTOSTERONE) 200 MG/ML injection Inject 1 mL (200 mg total) into the muscle every 14 (fourteen) days. 03/23/19  Yes [name], MD  torsemide (DEMADEX) 20 MG tablet Take 40 mg by mouth daily. 08/13/19  Yes [provider]  ULORIC 80 MG TABS Take 80 mg by mouth daily.  05/18/17  Yes [provider]     Review of Systems  Constitutional: Positive for fatigue. Negative for appetite change.  HENT: Positive for rhinorrhea. Negative for congestion and sore throat.   Eyes: Negative.   Respiratory: Positive for shortness of breath (with little exertion). Negative for cough and wheezing.   Cardiovascular: Positive for leg swelling (worse since switching to peritoneal dialysis). Negative for chest pain and palpitations.  Gastrointestinal: Negative for abdominal distention and abdominal pain.  Endocrine: Negative.   Genitourinary: Negative.   Musculoskeletal: Negative for back pain and neck pain.  Skin: Positive for color change (bilateral lower legs).  Allergic/Immunologic: Negative.   Neurological: Positive for light-headedness. Negative for dizziness.  Hematological: Negative for adenopathy. Does not bruise/bleed easily.  Psychiatric/Behavioral: Negative for dysphoric mood and sleep disturbance (wearing cpap at night; oxygen @2L ; sleeping on 1 pillow and wedge). The patient is not nervous/anxious.    Vitals:   12/14/19 0831   BP: 139/63  Pulse: 69  Resp: 18  SpO2: 100%  Weight: 297 lb 8 oz (134.9 kg)  Height: 5\' 11"  (1.803 m)   Wt Readings from Last 3 Encounters:  12/14/19 297 lb 8 oz (134.9 kg)  11/29/19 (!) 293 lb 6.4 oz (133.1 kg)  11/22/19 (!) 301 lb 13 oz (136.9 kg)   Lab Results  Component Value Date   CREATININE 12.49 (H) 11/29/2019   CREATININE 10.23 (H) 11/21/2019   CREATININE 10.08 (H) 11/20/2019    Physical Exam Vitals and nursing note reviewed.  Constitutional:      Appearance: He is well-developed.  HENT:     Head: Normocephalic and atraumatic.  Neck:     Vascular: No JVD.  Cardiovascular:     Rate and Rhythm: Normal rate and regular rhythm.  Pulmonary:     Effort: Pulmonary effort is normal. No respiratory distress.     Breath sounds: No wheezing or rales.  Abdominal:     Palpations: Abdomen is soft.     Tenderness: There is no abdominal tenderness.  Musculoskeletal:     Cervical back: Normal range of motion and neck supple.     Right lower leg: Tenderness (front and medial lower leg) present. Edema (1+ pitting) present.     Left lower leg: Tenderness (front and medial lower leg) present. Edema (1+ pitting) present.  Skin:    General: Skin is warm and dry.     Findings: Erythema (lower legs) present.  Neurological:     General: No focal deficit present.     Mental Status: He is alert and oriented to person, place, and time.  Psychiatric:        Mood and Affect: Mood normal.        Behavior: Behavior normal.    Assessment & Plan:  1: Chronic heart failure with preserved ejection fraction with structural changes- - NYHA class III - euvolemic today  - weighing daily and he was reminded to call for an overnight weight gain of >2 pounds or a weekly weight gain of >5 pounds - weight down 2 pounds from last visit here 6 monhts ago - not adding salt and has been reading food labels as well as rinsing canned foods  - saw cardiology Dema Severin) 09/18/19 - BNP 11/29/19 was 29.1 -  reports receiving both COVID vaccines  2: HTN- - BP looks good today - saw PCP (Flinchum) 11/29/19 - BMP from 11/29/19 reviewed and showed sodium 140, potassium 4.5, creatinine 12.49 and GFR 4  3: DM-  - A1c 11/12/19 was 5.8% - glucose at home this morning was 183 - saw  nephrology [name]) 05/14/19 - now doing peritoneal dialysis with adjustments still being made - drinking ~ 50 ounces fluid/ day and is aware that he should only be drinking ~ 32 ounces/ day per nephrology recommendation  4: COPD- - saw pulmonologist Lanney Gins) 12/05/19 - wearing cpap - wears oxygen at 2L QHS and PRN during the day or upon exertion - says that his morning oxygen level continues to be ~85%; instructed him to call pulmonology and make them aware  5: Leg pain- - redness and tenderness in the front and medial sides of both legs - recent US was negative - sees vascular next week and will f/u with them - advised him that should this worsen, to contact his PCP for an appointment   Medication list was reviewed.  Due to HF stability, patient opts to not make a return appointment at this time. Advised patient that he could call back at anytime to schedule another appointment. Patient was comfortable with this plan.

## 2019-12-14 ENCOUNTER — Encounter: Payer: Self-pay | Admitting: Family

## 2019-12-14 ENCOUNTER — Ambulatory Visit: Payer: Commercial Managed Care - PPO | Attending: Family | Admitting: Family

## 2019-12-14 ENCOUNTER — Encounter (INDEPENDENT_AMBULATORY_CARE_PROVIDER_SITE_OTHER): Payer: Commercial Managed Care - PPO

## 2019-12-14 ENCOUNTER — Ambulatory Visit (INDEPENDENT_AMBULATORY_CARE_PROVIDER_SITE_OTHER): Payer: Commercial Managed Care - PPO | Admitting: Vascular Surgery

## 2019-12-14 ENCOUNTER — Other Ambulatory Visit: Payer: Self-pay

## 2019-12-14 VITALS — BP 139/63 | HR 69 | Resp 18 | Ht 71.0 in | Wt 297.5 lb

## 2019-12-14 DIAGNOSIS — J3489 Other specified disorders of nose and nasal sinuses: Secondary | ICD-10-CM | POA: Diagnosis not present

## 2019-12-14 DIAGNOSIS — Z992 Dependence on renal dialysis: Secondary | ICD-10-CM | POA: Diagnosis not present

## 2019-12-14 DIAGNOSIS — J449 Chronic obstructive pulmonary disease, unspecified: Secondary | ICD-10-CM

## 2019-12-14 DIAGNOSIS — Z8249 Family history of ischemic heart disease and other diseases of the circulatory system: Secondary | ICD-10-CM | POA: Diagnosis not present

## 2019-12-14 DIAGNOSIS — Z87891 Personal history of nicotine dependence: Secondary | ICD-10-CM | POA: Insufficient documentation

## 2019-12-14 DIAGNOSIS — E1122 Type 2 diabetes mellitus with diabetic chronic kidney disease: Secondary | ICD-10-CM | POA: Diagnosis not present

## 2019-12-14 DIAGNOSIS — M79606 Pain in leg, unspecified: Secondary | ICD-10-CM | POA: Diagnosis not present

## 2019-12-14 DIAGNOSIS — I10 Essential (primary) hypertension: Secondary | ICD-10-CM

## 2019-12-14 DIAGNOSIS — K219 Gastro-esophageal reflux disease without esophagitis: Secondary | ICD-10-CM | POA: Diagnosis not present

## 2019-12-14 DIAGNOSIS — R42 Dizziness and giddiness: Secondary | ICD-10-CM | POA: Diagnosis not present

## 2019-12-14 DIAGNOSIS — Z794 Long term (current) use of insulin: Secondary | ICD-10-CM | POA: Insufficient documentation

## 2019-12-14 DIAGNOSIS — I5032 Chronic diastolic (congestive) heart failure: Secondary | ICD-10-CM | POA: Diagnosis not present

## 2019-12-14 DIAGNOSIS — E079 Disorder of thyroid, unspecified: Secondary | ICD-10-CM | POA: Insufficient documentation

## 2019-12-14 DIAGNOSIS — E785 Hyperlipidemia, unspecified: Secondary | ICD-10-CM | POA: Diagnosis not present

## 2019-12-14 DIAGNOSIS — I130 Hypertensive heart and chronic kidney disease with heart failure and stage 1 through stage 4 chronic kidney disease, or unspecified chronic kidney disease: Secondary | ICD-10-CM | POA: Insufficient documentation

## 2019-12-14 DIAGNOSIS — R5383 Other fatigue: Secondary | ICD-10-CM | POA: Diagnosis not present

## 2019-12-14 DIAGNOSIS — F329 Major depressive disorder, single episode, unspecified: Secondary | ICD-10-CM | POA: Diagnosis not present

## 2019-12-14 DIAGNOSIS — N189 Chronic kidney disease, unspecified: Secondary | ICD-10-CM | POA: Insufficient documentation

## 2019-12-14 DIAGNOSIS — R0602 Shortness of breath: Secondary | ICD-10-CM | POA: Insufficient documentation

## 2019-12-14 DIAGNOSIS — Z7901 Long term (current) use of anticoagulants: Secondary | ICD-10-CM | POA: Insufficient documentation

## 2019-12-14 DIAGNOSIS — Z79899 Other long term (current) drug therapy: Secondary | ICD-10-CM | POA: Diagnosis not present

## 2019-12-14 DIAGNOSIS — M79605 Pain in left leg: Secondary | ICD-10-CM

## 2019-12-14 DIAGNOSIS — M79604 Pain in right leg: Secondary | ICD-10-CM

## 2019-12-14 NOTE — Patient Instructions (Addendum)
Continue weighing daily and call for an overnight weight gain of > 2 pounds or a weekly weight gain of >5 pounds.   Call us in the future if you'd like to make another appointment 

## 2019-12-17 ENCOUNTER — Other Ambulatory Visit (INDEPENDENT_AMBULATORY_CARE_PROVIDER_SITE_OTHER): Payer: Self-pay | Admitting: Vascular Surgery

## 2019-12-17 DIAGNOSIS — Z9889 Other specified postprocedural states: Secondary | ICD-10-CM

## 2019-12-17 DIAGNOSIS — N186 End stage renal disease: Secondary | ICD-10-CM

## 2019-12-18 ENCOUNTER — Ambulatory Visit: Payer: Commercial Managed Care - PPO | Admitting: Family

## 2019-12-21 ENCOUNTER — Other Ambulatory Visit: Payer: Self-pay

## 2019-12-21 ENCOUNTER — Ambulatory Visit (INDEPENDENT_AMBULATORY_CARE_PROVIDER_SITE_OTHER): Payer: Medicare Other | Admitting: Nurse Practitioner

## 2019-12-21 ENCOUNTER — Encounter (INDEPENDENT_AMBULATORY_CARE_PROVIDER_SITE_OTHER): Payer: Self-pay | Admitting: Nurse Practitioner

## 2019-12-21 ENCOUNTER — Ambulatory Visit (INDEPENDENT_AMBULATORY_CARE_PROVIDER_SITE_OTHER): Payer: Commercial Managed Care - PPO

## 2019-12-21 VITALS — BP 95/47 | HR 70 | Ht 71.0 in | Wt 299.0 lb

## 2019-12-21 DIAGNOSIS — N186 End stage renal disease: Secondary | ICD-10-CM

## 2019-12-21 DIAGNOSIS — M7989 Other specified soft tissue disorders: Secondary | ICD-10-CM

## 2019-12-21 DIAGNOSIS — M79605 Pain in left leg: Secondary | ICD-10-CM

## 2019-12-21 DIAGNOSIS — Z992 Dependence on renal dialysis: Secondary | ICD-10-CM

## 2019-12-21 DIAGNOSIS — I10 Essential (primary) hypertension: Secondary | ICD-10-CM

## 2019-12-21 DIAGNOSIS — Z9889 Other specified postprocedural states: Secondary | ICD-10-CM

## 2019-12-21 DIAGNOSIS — M79604 Pain in right leg: Secondary | ICD-10-CM

## 2019-12-25 ENCOUNTER — Encounter (INDEPENDENT_AMBULATORY_CARE_PROVIDER_SITE_OTHER): Payer: Self-pay | Admitting: Nurse Practitioner

## 2019-12-25 ENCOUNTER — Ambulatory Visit: Payer: Medicare Other | Admitting: Adult Health

## 2019-12-25 NOTE — Progress Notes (Signed)
Subjective:    Patient ID: Patrick Hurst, male    DOB: [birth year], 64 y.o.   MRN: [id number] Chief Complaint  Patient presents with  . Follow-up    5 wk [facility] post arterriovenous fistula creation radial cephalic     The patient presents today for first look of his left radiocephalic AV fistula created on 11/19/2019.  The patient currently does peritoneal dialysis however the peritoneal dialysis not been fully effective and therefore he has been doing weekly hemodialysis via PermCath.  The patient denies any issues with healing of his radiocephalic AV fistula.  He denies any fever, chills, nausea, vomiting or diarrhea.  The fistula has a good thrill and bruit is readily palpable.  Today the flow volume is 1279 with no evidence of stenosis.  There is normal flow seen throughout.  However, the patient also complains of lower extremity swelling and pain.  The patient describes burning and stinging in his lower extremities as well as soreness.  The patient also has some numbness as well.  The patient does have known neuropathy.  The patient has been wearing medical grade 1 compression stockings for several months and despite use of compression elevation he still continues to have pain and discomfort in his lower extremities.  The patient is not aware of any known PAD.   Review of Systems  Respiratory: Positive for shortness of breath (With exertion).   Cardiovascular: Positive for leg swelling.  All other systems reviewed and are negative.      Objective:   Physical Exam Vitals reviewed.  Constitutional:      Appearance: He is obese.  HENT:     Head: Normocephalic.  Cardiovascular:     Rate and Rhythm: Normal rate and regular rhythm.     Pulses: Normal pulses.     Arteriovenous access: left arteriovenous access is present.    Comments: Left radiocephalic AV fistula with good thrill and bruit.  Readily palpable Musculoskeletal:     Right lower leg: 2+ Edema present.     Left lower leg:  2+ Edema present.  Skin:    General: Skin is warm and dry.     Comments: Stasis dermatitis  Neurological:     Mental Status: He is alert and oriented to person, place, and time.  Psychiatric:        Mood and Affect: Mood normal.        Behavior: Behavior normal.        Thought Content: Thought content normal.        Judgment: Judgment normal.     BP (!) 95/47   Pulse 70   Ht 5\' 11"  (1.803 m)   Wt 299 lb (135.6 kg)   BMI 41.70 kg/m   Past Medical History:  Diagnosis Date  . Acute on chronic diastolic CHF (congestive heart failure) ([place]) 06/21/2018  . Acute respiratory failure with hypoxia ([place]) 06/21/2018  . Bell palsy 02/12/2015  . Carpal tunnel syndrome 02/12/2015  . Dependence on nocturnal oxygen therapy    2 LITERS WITH BIPAP  . Depression   . Gastric ulcer   . GERD (gastroesophageal reflux disease)   . Gout   . History of chicken pox   . Multifocal pneumonia 03/04/2019  . Sleep apnea treated with nocturnal BiPAP     Social History   Socioeconomic History  . Marital status: Married    Spouse name: Hilda Blades  . Number of children: Not on file  . Years of education: Not on file  .  Highest education level: Not on file  Occupational History  . Occupation: Works at [employer]    Comment: Full time  Tobacco Use  . Smoking status: Former Smoker    Packs/day: 1.00    Years: 15.00    Pack years: 15.00    Types: Cigarettes    Quit date: 05/03/1978    Years since quitting: 41.6  . Smokeless tobacco: Never Used  . Tobacco comment: started smoking at age 10  Vaping Use  . Vaping Use: Never used  Substance and Sexual Activity  . Alcohol use: No    Alcohol/week: 0.0 standard drinks  . Drug use: No  . Sexual activity: Not Currently  Other Topics Concern  . Not on file  Social History Narrative  . Not on file   Social Determinants of Health   Financial Resource Strain:   . Difficulty of Paying Living Expenses: Not on file  Food Insecurity:   . Worried About  Charity fundraiser in the Last Year: Not on file  . Ran Out of Food in the Last Year: Not on file  Transportation Needs:   . Lack of Transportation (Medical): Not on file  . Lack of Transportation (Non-Medical): Not on file  Physical Activity:   . Days of Exercise per Week: Not on file  . Minutes of Exercise per Session: Not on file  Stress:   . Feeling of Stress : Not on file  Social Connections:   . Frequency of Communication with Friends and Family: Not on file  . Frequency of Social Gatherings with Friends and Family: Not on file  . Attends Religious Services: Not on file  . Active Member of Clubs or Organizations: Not on file  . Attends Archivist Meetings: Not on file  . Marital Status: Not on file  Intimate Partner Violence:   . Fear of Current or Ex-Partner: Not on file  . Emotionally Abused: Not on file  . Physically Abused: Not on file  . Sexually Abused: Not on file    Past Surgical History:  Procedure Laterality Date  . AV FISTULA PLACEMENT Left 11/08/2019   Procedure: ARTERIOVENOUS (AV) FISTULA CREATION (RADIALCEPHALIC);  Surgeon: Algernon Huxley, MD;  Location: [facility] ORS;  Service: Vascular;  Laterality: Left;  . CATARACT EXTRACTION Bilateral 2014 and 2015  . CHOLECYSTECTOMY  04/28/2011   Laproscopic; Dr. Pat Patrick  . DIALYSIS/PERMA CATHETER INSERTION N/A 05/24/2019   Procedure: DIALYSIS/PERMA CATHETER INSERTION;  Surgeon: Algernon Huxley, MD;  Location: [facility];  Service: Cardiovascular;  Laterality: N/A;  . DIALYSIS/PERMA CATHETER INSERTION N/A 10/09/2019   Procedure: DIALYSIS/PERMA CATHETER INSERTION;  Surgeon: Katha Cabal, MD;  Location: [facility];  Service: Cardiovascular;  Laterality: N/A;  . EYE SURGERY    . GALLBLADDER SURGERY    . LASIK Bilateral 2019   medical  . SHOULDER SURGERY Right 2012   Dr. Leanor Kail    Family History  Problem Relation Age of Onset  . COPD Mother   . Cancer Mother   . Heart disease Father      Allergies  Allergen Reactions  . Mucinex [Guaifenesin Er] Swelling    Throat swelling, increases heart rate  . Levaquin [Levofloxacin] Palpitations       Assessment & Plan:   1. ESRD on dialysis [facility]) In discussion with the patient, it is not entirely clear whether this new access will be used to supplement his peritoneal dialysis access or only when needed.  In either case, the fistula should be fully mature in 2 weeks and able to be accessed at that time.  If the patient has ongoing use of his dialysis access we will see the patient in 6 months however if its as needed we will follow on an annual basis.  2. Essential hypertension Continue antihypertensive medications as already ordered, these medications have been reviewed and there are no changes at this time.   3. Leg swelling I have had a long discussion with the patient regarding swelling and why it  causes symptoms.  Patient will begin wearing graduated compression stockings class 1 (20-30 mmHg) on a daily basis a prescription was given. The patient will  beginning wearing the stockings first thing in the morning and removing them in the evening. The patient is instructed specifically not to sleep in the stockings.   In addition, behavioral modification will be initiated.  This will include frequent elevation, use of over the counter pain medications and exercise such as walking.  I have reviewed systemic causes for chronic edema such as liver, kidney and cardiac etiologies.  The patient denies problems with these organ systems.    Consideration for a lymph pump will also be made based upon the effectiveness of conservative therapy.  This would help to improve the edema control and prevent sequela such as ulcers and infections   Patient should undergo duplex ultrasound of the venous system to ensure that DVT or reflux is not present.  The patient will follow-up with me after the ultrasound.    4. Pain in both lower  extremities  Recommend:  The patient has atypical pain symptoms for pure atherosclerotic disease. However, on physical exam there is evidence of mixed venous and arterial disease, given the diminished pulses and the edema associated with venous changes of the legs.  Noninvasive studies including ABI's and venous ultrasound of the legs will be obtained and the patient will follow up with me to review these studies.  I suspect the patient is c/o pseudoclaudication.  Patient should have an evaluation of his LS spine which I defer to the primary service.  The patient should continue walking and begin a more formal exercise program. The patient should continue his antiplatelet therapy and aggressive treatment of the lipid abnormalities.  The patient should begin wearing graduated compression socks 15-20 mmHg strength to control edema.    Current Outpatient Medications on File Prior to Visit  Medication Sig Dispense Refill  . amiodarone (PACERONE) 200 MG tablet Take 200 mg by mouth daily.    Marland Kitchen amitriptyline (ELAVIL) 25 MG tablet TAKE 1 TABLET AT BEDTIME (Patient taking differently: Take 25 mg by mouth at bedtime. ) 90 tablet 3  . amLODipine (NORVASC) 5 MG tablet Take 1 tablet (5 mg total) by mouth daily. 90 tablet 4  . Ascorbic Acid (VITAMIN C) 1000 MG tablet Take 1,000 mg by mouth 2 (two) times daily.     Marland Kitchen atorvastatin (LIPITOR) 40 MG tablet TAKE 1 TABLET DAILY 90 tablet 0  . BD INSULIN SYRINGE U/F 31G X 5/16" 1 ML MISC     . calcitRIOL (ROCALTROL) 0.5 MCG capsule Take 0.5 mcg by mouth daily.    Marland Kitchen ELIQUIS 2.5 MG TABS tablet Take 2.5 mg by mouth 2 (two) times daily.     Marland Kitchen gentamicin cream (GARAMYCIN) 0.1 % Apply 1 application topically daily.    . insulin regular human CONCENTRATED (HUMULIN R) 500 UNIT/ML injection Inject up to 25 units twice a day,  or as directed by physician (Patient taking differently: Inject 50 Units into the skin 2 (two) times daily with a meal. 60 UNITS IN THE AM & 50  UNITS AT NIGHT) 60 mL 3  . lactulose (CHRONULAC) 10 GM/15ML solution Take 30 mLs by mouth daily.    Marland Kitchen levothyroxine (SYNTHROID) 100 MCG tablet TAKE 1 TABLET DAILY (Patient taking differently: Take 100 mcg by mouth daily before breakfast. ) 90 tablet 2  . metoprolol tartrate (LOPRESSOR) 50 MG tablet Take 0.5 tablets (25 mg total) by mouth 2 (two) times daily.    . pantoprazole (PROTONIX) 40 MG tablet TAKE 1 TABLET TWICE A DAY (Patient taking differently: Take 40 mg by mouth daily. ) 180 tablet 3  . Spacer/Aero-Holding Chambers (EASIVENT) inhaler See admin instructions.    Marland Kitchen testosterone cypionate (DEPO-TESTOSTERONE) 200 MG/ML injection Inject 1 mL (200 mg total) into the muscle every 14 (fourteen) days. 6 mL 1  . torsemide (DEMADEX) 20 MG tablet Take 40 mg by mouth daily.    Marland Kitchen ULORIC 80 MG TABS Take 80 mg by mouth daily.     . Budeson-Glycopyrrol-Formoterol (BREZTRI AEROSPHERE) 160-9-4.8 MCG/ACT AERO Inhale 2 puffs into the lungs in the morning and at bedtime. (Patient not taking: Reported on 12/21/2019)    . cephALEXin (KEFLEX) 500 MG capsule Take 500 mg by mouth daily.    . cetirizine (ZYRTEC) 10 MG tablet Take 10 mg by mouth daily.  (Patient not taking: Reported on 12/21/2019)    . Ipratropium-Albuterol (COMBIVENT RESPIMAT) 20-100 MCG/ACT AERS respimat Inhale 1 puff into the lungs every 6 (six) hours. (Patient not taking: Reported on 12/21/2019)    . Ipratropium-Albuterol (COMBIVENT RESPIMAT) 20-100 MCG/ACT AERS respimat Inhale 2 puffs into the lungs every 4 (four) hours as needed for wheezing. (Patient not taking: Reported on 12/21/2019)    . Spacer/Aero-Holding Chambers (AEROCHAMBER PLUS WITH MASK) inhaler See admin instructions.     No current facility-administered medications on file prior to visit.    There are no Patient Instructions on file for this visit. No follow-ups on file.   Kris Hartmann, NP

## 2019-12-27 ENCOUNTER — Inpatient Hospital Stay
Admission: EM | Admit: 2019-12-27 | Discharge: 2020-01-01 | DRG: 291 | Disposition: A | Discharge status: Home / Self Care | Payer: Commercial Managed Care - PPO | Attending: Family Medicine | Admitting: Family Medicine

## 2019-12-27 ENCOUNTER — Emergency Department: Payer: Commercial Managed Care - PPO

## 2019-12-27 ENCOUNTER — Other Ambulatory Visit: Payer: Self-pay

## 2019-12-27 DIAGNOSIS — D500 Iron deficiency anemia secondary to blood loss (chronic): Secondary | ICD-10-CM | POA: Diagnosis present

## 2019-12-27 DIAGNOSIS — K921 Melena: Secondary | ICD-10-CM | POA: Diagnosis present

## 2019-12-27 DIAGNOSIS — E11649 Type 2 diabetes mellitus with hypoglycemia without coma: Secondary | ICD-10-CM | POA: Diagnosis present

## 2019-12-27 DIAGNOSIS — M109 Gout, unspecified: Secondary | ICD-10-CM | POA: Diagnosis present

## 2019-12-27 DIAGNOSIS — Z888 Allergy status to other drugs, medicaments and biological substances status: Secondary | ICD-10-CM

## 2019-12-27 DIAGNOSIS — E039 Hypothyroidism, unspecified: Secondary | ICD-10-CM | POA: Diagnosis present

## 2019-12-27 DIAGNOSIS — R4182 Altered mental status, unspecified: Secondary | ICD-10-CM | POA: Diagnosis present

## 2019-12-27 DIAGNOSIS — K529 Noninfective gastroenteritis and colitis, unspecified: Secondary | ICD-10-CM | POA: Diagnosis present

## 2019-12-27 DIAGNOSIS — Z8619 Personal history of other infectious and parasitic diseases: Secondary | ICD-10-CM

## 2019-12-27 DIAGNOSIS — E160 Drug-induced hypoglycemia without coma: Secondary | ICD-10-CM | POA: Diagnosis not present

## 2019-12-27 DIAGNOSIS — Z8249 Family history of ischemic heart disease and other diseases of the circulatory system: Secondary | ICD-10-CM

## 2019-12-27 DIAGNOSIS — I5033 Acute on chronic diastolic (congestive) heart failure: Secondary | ICD-10-CM | POA: Diagnosis present

## 2019-12-27 DIAGNOSIS — L409 Psoriasis, unspecified: Secondary | ICD-10-CM | POA: Diagnosis present

## 2019-12-27 DIAGNOSIS — N3090 Cystitis, unspecified without hematuria: Secondary | ICD-10-CM | POA: Diagnosis present

## 2019-12-27 DIAGNOSIS — K766 Portal hypertension: Secondary | ICD-10-CM | POA: Diagnosis present

## 2019-12-27 DIAGNOSIS — K219 Gastro-esophageal reflux disease without esophagitis: Secondary | ICD-10-CM | POA: Diagnosis present

## 2019-12-27 DIAGNOSIS — J9622 Acute and chronic respiratory failure with hypercapnia: Secondary | ICD-10-CM | POA: Diagnosis present

## 2019-12-27 DIAGNOSIS — Z794 Long term (current) use of insulin: Secondary | ICD-10-CM

## 2019-12-27 DIAGNOSIS — T383X5A Adverse effect of insulin and oral hypoglycemic [antidiabetic] drugs, initial encounter: Secondary | ICD-10-CM

## 2019-12-27 DIAGNOSIS — D62 Acute posthemorrhagic anemia: Secondary | ICD-10-CM

## 2019-12-27 DIAGNOSIS — A419 Sepsis, unspecified organism: Secondary | ICD-10-CM

## 2019-12-27 DIAGNOSIS — J9621 Acute and chronic respiratory failure with hypoxia: Secondary | ICD-10-CM

## 2019-12-27 DIAGNOSIS — G510 Bell's palsy: Secondary | ICD-10-CM | POA: Diagnosis present

## 2019-12-27 DIAGNOSIS — I4891 Unspecified atrial fibrillation: Secondary | ICD-10-CM

## 2019-12-27 DIAGNOSIS — Z87891 Personal history of nicotine dependence: Secondary | ICD-10-CM

## 2019-12-27 DIAGNOSIS — Z825 Family history of asthma and other chronic lower respiratory diseases: Secondary | ICD-10-CM

## 2019-12-27 DIAGNOSIS — N186 End stage renal disease: Secondary | ICD-10-CM

## 2019-12-27 DIAGNOSIS — D649 Anemia, unspecified: Secondary | ICD-10-CM | POA: Diagnosis not present

## 2019-12-27 DIAGNOSIS — Z20822 Contact with and (suspected) exposure to covid-19: Secondary | ICD-10-CM | POA: Diagnosis present

## 2019-12-27 DIAGNOSIS — Z79890 Hormone replacement therapy (postmenopausal): Secondary | ICD-10-CM

## 2019-12-27 DIAGNOSIS — M899 Disorder of bone, unspecified: Secondary | ICD-10-CM | POA: Diagnosis present

## 2019-12-27 DIAGNOSIS — G9341 Metabolic encephalopathy: Secondary | ICD-10-CM | POA: Diagnosis present

## 2019-12-27 DIAGNOSIS — Z992 Dependence on renal dialysis: Secondary | ICD-10-CM | POA: Diagnosis not present

## 2019-12-27 DIAGNOSIS — I959 Hypotension, unspecified: Secondary | ICD-10-CM

## 2019-12-27 DIAGNOSIS — I132 Hypertensive heart and chronic kidney disease with heart failure and with stage 5 chronic kidney disease, or end stage renal disease: Secondary | ICD-10-CM | POA: Diagnosis not present

## 2019-12-27 DIAGNOSIS — D631 Anemia in chronic kidney disease: Secondary | ICD-10-CM | POA: Diagnosis present

## 2019-12-27 DIAGNOSIS — E1140 Type 2 diabetes mellitus with diabetic neuropathy, unspecified: Secondary | ICD-10-CM | POA: Diagnosis present

## 2019-12-27 DIAGNOSIS — K3189 Other diseases of stomach and duodenum: Secondary | ICD-10-CM | POA: Diagnosis present

## 2019-12-27 DIAGNOSIS — F329 Major depressive disorder, single episode, unspecified: Secondary | ICD-10-CM | POA: Diagnosis present

## 2019-12-27 DIAGNOSIS — I482 Chronic atrial fibrillation, unspecified: Secondary | ICD-10-CM | POA: Diagnosis present

## 2019-12-27 DIAGNOSIS — Z8711 Personal history of peptic ulcer disease: Secondary | ICD-10-CM

## 2019-12-27 DIAGNOSIS — E1122 Type 2 diabetes mellitus with diabetic chronic kidney disease: Secondary | ICD-10-CM | POA: Diagnosis present

## 2019-12-27 DIAGNOSIS — Z9981 Dependence on supplemental oxygen: Secondary | ICD-10-CM

## 2019-12-27 DIAGNOSIS — Z7901 Long term (current) use of anticoagulants: Secondary | ICD-10-CM

## 2019-12-27 DIAGNOSIS — T68XXXA Hypothermia, initial encounter: Secondary | ICD-10-CM

## 2019-12-27 DIAGNOSIS — G4733 Obstructive sleep apnea (adult) (pediatric): Secondary | ICD-10-CM | POA: Diagnosis present

## 2019-12-27 DIAGNOSIS — Z6841 Body Mass Index (BMI) 40.0 and over, adult: Secondary | ICD-10-CM | POA: Diagnosis not present

## 2019-12-27 DIAGNOSIS — N2581 Secondary hyperparathyroidism of renal origin: Secondary | ICD-10-CM | POA: Diagnosis present

## 2019-12-27 DIAGNOSIS — R609 Edema, unspecified: Secondary | ICD-10-CM

## 2019-12-27 DIAGNOSIS — E8770 Fluid overload, unspecified: Secondary | ICD-10-CM | POA: Diagnosis present

## 2019-12-27 LAB — GLUCOSE, CAPILLARY
Glucose-Capillary: 111 mg/dL — ABNORMAL HIGH (ref 70–99)
Glucose-Capillary: 133 mg/dL — ABNORMAL HIGH (ref 70–99)
Glucose-Capillary: 143 mg/dL — ABNORMAL HIGH (ref 70–99)
Glucose-Capillary: 171 mg/dL — ABNORMAL HIGH (ref 70–99)
Glucose-Capillary: 55 mg/dL — ABNORMAL LOW (ref 70–99)
Glucose-Capillary: 84 mg/dL (ref 70–99)
Glucose-Capillary: 98 mg/dL (ref 70–99)

## 2019-12-27 LAB — CBC WITH DIFFERENTIAL/PLATELET
Abs Immature Granulocytes: 0.44 10*3/uL — ABNORMAL HIGH (ref 0.00–0.07)
Basophils Absolute: 0.1 10*3/uL (ref 0.0–0.1)
Basophils Relative: 1 %
Eosinophils Absolute: 0.2 10*3/uL (ref 0.0–0.5)
Eosinophils Relative: 2 %
HCT: 17.9 % — ABNORMAL LOW (ref 39.0–52.0)
Hemoglobin: 5.7 g/dL — ABNORMAL LOW (ref 13.0–17.0)
Immature Granulocytes: 5 %
Lymphocytes Relative: 6 %
Lymphs Abs: 0.5 10*3/uL — ABNORMAL LOW (ref 0.7–4.0)
MCH: 30.6 pg (ref 26.0–34.0)
MCHC: 31.8 g/dL (ref 30.0–36.0)
MCV: 96.2 fL (ref 80.0–100.0)
Monocytes Absolute: 1.1 10*3/uL — ABNORMAL HIGH (ref 0.1–1.0)
Monocytes Relative: 11 %
Neutro Abs: 7.1 10*3/uL (ref 1.7–7.7)
Neutrophils Relative %: 75 %
Platelets: 109 10*3/uL — ABNORMAL LOW (ref 150–400)
RBC: 1.86 MIL/uL — ABNORMAL LOW (ref 4.22–5.81)
RDW: 16.9 % — ABNORMAL HIGH (ref 11.5–15.5)
WBC: 9.4 10*3/uL (ref 4.0–10.5)
nRBC: 0.4 % — ABNORMAL HIGH (ref 0.0–0.2)

## 2019-12-27 LAB — BLOOD GAS, VENOUS
Acid-base deficit: 8.4 mmol/L — ABNORMAL HIGH (ref 0.0–2.0)
Bicarbonate: 22 mmol/L (ref 20.0–28.0)
O2 Saturation: 73.1 %
Patient temperature: 37
pCO2, Ven: 66 mmHg — ABNORMAL HIGH (ref 44.0–60.0)
pH, Ven: 7.13 — CL (ref 7.250–7.430)
pO2, Ven: 52 mmHg — ABNORMAL HIGH (ref 32.0–45.0)

## 2019-12-27 LAB — CBC
HCT: 17 % — ABNORMAL LOW (ref 39.0–52.0)
Hemoglobin: 5.4 g/dL — ABNORMAL LOW (ref 13.0–17.0)
MCH: 30.5 pg (ref 26.0–34.0)
MCHC: 31.8 g/dL (ref 30.0–36.0)
MCV: 96 fL (ref 80.0–100.0)
Platelets: 111 10*3/uL — ABNORMAL LOW (ref 150–400)
RBC: 1.77 MIL/uL — ABNORMAL LOW (ref 4.22–5.81)
RDW: 16.7 % — ABNORMAL HIGH (ref 11.5–15.5)
WBC: 10.7 10*3/uL — ABNORMAL HIGH (ref 4.0–10.5)
nRBC: 0.5 % — ABNORMAL HIGH (ref 0.0–0.2)

## 2019-12-27 LAB — PREPARE RBC (CROSSMATCH)

## 2019-12-27 LAB — HEMOGLOBIN AND HEMATOCRIT, BLOOD
HCT: 15.3 % — ABNORMAL LOW (ref 39.0–52.0)
HCT: 16.7 % — ABNORMAL LOW (ref 39.0–52.0)
Hemoglobin: 5.3 g/dL — ABNORMAL LOW (ref 13.0–17.0)
Hemoglobin: 5.5 g/dL — ABNORMAL LOW (ref 13.0–17.0)

## 2019-12-27 LAB — COMPREHENSIVE METABOLIC PANEL
ALT: 18 U/L (ref 0–44)
AST: 15 U/L (ref 15–41)
Albumin: 3.2 g/dL — ABNORMAL LOW (ref 3.5–5.0)
Alkaline Phosphatase: 114 U/L (ref 38–126)
Anion gap: 22 — ABNORMAL HIGH (ref 5–15)
BUN: 99 mg/dL — ABNORMAL HIGH (ref 8–23)
CO2: 25 mmol/L (ref 22–32)
Calcium: 6.7 mg/dL — ABNORMAL LOW (ref 8.9–10.3)
Chloride: 93 mmol/L — ABNORMAL LOW (ref 98–111)
Creatinine, Ser: 17.58 mg/dL — ABNORMAL HIGH (ref 0.61–1.24)
GFR calc Af Amer: 3 mL/min — ABNORMAL LOW (ref >60)
GFR calc non Af Amer: 3 mL/min — ABNORMAL LOW (ref >60)
Glucose, Bld: 61 mg/dL — ABNORMAL LOW (ref 70–99)
Potassium: 4.5 mmol/L (ref 3.5–5.1)
Sodium: 140 mmol/L (ref 135–145)
Total Bilirubin: 0.9 mg/dL (ref 0.3–1.2)
Total Protein: 7.6 g/dL (ref 6.5–8.1)

## 2019-12-27 LAB — URINE DRUG SCREEN, QUALITATIVE (ARMC ONLY)
Amphetamines, Ur Screen: NOT DETECTED
Barbiturates, Ur Screen: NOT DETECTED
Benzodiazepine, Ur Scrn: NOT DETECTED
Cannabinoid 50 Ng, Ur ~~LOC~~: NOT DETECTED
Cocaine Metabolite,Ur ~~LOC~~: NOT DETECTED
MDMA (Ecstasy)Ur Screen: NOT DETECTED
Methadone Scn, Ur: NOT DETECTED
Opiate, Ur Screen: NOT DETECTED
Phencyclidine (PCP) Ur S: NOT DETECTED
Tricyclic, Ur Screen: POSITIVE — AB

## 2019-12-27 LAB — PROCALCITONIN: Procalcitonin: 1.35 ng/mL

## 2019-12-27 LAB — PROTIME-INR
INR: 1.3 — ABNORMAL HIGH (ref 0.8–1.2)
Prothrombin Time: 15.7 seconds — ABNORMAL HIGH (ref 11.4–15.2)

## 2019-12-27 LAB — LACTIC ACID, PLASMA
Lactic Acid, Venous: 0.8 mmol/L (ref 0.5–1.9)
Lactic Acid, Venous: 0.6 mmol/L (ref 0.5–1.9)

## 2019-12-27 LAB — SARS CORONAVIRUS 2 BY RT PCR (HOSPITAL ORDER, PERFORMED IN ~~LOC~~ HOSPITAL LAB): SARS Coronavirus 2: NEGATIVE

## 2019-12-27 LAB — LIPASE, BLOOD: Lipase: 29 U/L (ref 11–51)

## 2019-12-27 LAB — BRAIN NATRIURETIC PEPTIDE: B Natriuretic Peptide: 319.9 pg/mL — ABNORMAL HIGH (ref 0.0–100.0)

## 2019-12-27 LAB — PHOSPHORUS: Phosphorus: 15.1 mg/dL — ABNORMAL HIGH (ref 2.5–4.6)

## 2019-12-27 LAB — ETHANOL: Alcohol, Ethyl (B): <10 mg/dL (ref <10)

## 2019-12-27 LAB — MRSA PCR SCREENING: MRSA by PCR: NEGATIVE

## 2019-12-27 MED ORDER — SODIUM CHLORIDE 0.9% FLUSH
3.0000 mL | Freq: Two times a day (BID) | INTRAVENOUS | Status: DC
  Administered 2019-12-27 – 2020-01-01 (×8): 3 mL via INTRAVENOUS

## 2019-12-27 MED ORDER — LACTULOSE 10 GM/15ML PO SOLN
20.0000 g | Freq: Every day | ORAL | Status: DC
  Administered 2019-12-27 – 2020-01-01 (×4): 20 g via ORAL
  Filled 2019-12-27 (×5): qty 30

## 2019-12-27 MED ORDER — SODIUM CHLORIDE 0.9% FLUSH: 3.0000 mL | INTRAVENOUS | Status: DC | PRN

## 2019-12-27 MED ORDER — FEBUXOSTAT 40 MG PO TABS
80.0000 mg | ORAL_TABLET | Freq: Every day | ORAL | Status: DC
  Administered 2019-12-27 – 2020-01-01 (×5): 80 mg via ORAL
  Filled 2019-12-27 (×6): qty 2

## 2019-12-27 MED ORDER — DEXTROSE 50 % IV SOLN
1.0000 | Freq: Once | INTRAVENOUS | Status: AC
  Administered 2019-12-27: 50 mL via INTRAVENOUS

## 2019-12-27 MED ORDER — PANTOPRAZOLE SODIUM 40 MG PO TBEC
40.0000 mg | DELAYED_RELEASE_TABLET | Freq: Every day | ORAL | Status: DC
  Administered 2019-12-27: 40 mg via ORAL
  Filled 2019-12-27: qty 1

## 2019-12-27 MED ORDER — ALUM & MAG HYDROXIDE-SIMETH 200-200-20 MG/5ML PO SUSP
30.0000 mL | ORAL | Status: DC | PRN
  Administered 2019-12-27: 30 mL via ORAL
  Filled 2019-12-27: qty 30

## 2019-12-27 MED ORDER — ONDANSETRON HCL 4 MG/2ML IJ SOLN: 4.0000 mg | Freq: Four times a day (QID) | INTRAMUSCULAR | Status: DC | PRN

## 2019-12-27 MED ORDER — AMIODARONE HCL 200 MG PO TABS
200.0000 mg | ORAL_TABLET | Freq: Every day | ORAL | Status: DC
  Administered 2019-12-28 – 2020-01-01 (×4): 200 mg via ORAL
  Filled 2019-12-27 (×6): qty 1

## 2019-12-27 MED ORDER — SODIUM CHLORIDE 0.9 % IV SOLN: 100.0000 mL | INTRAVENOUS | Status: DC | PRN

## 2019-12-27 MED ORDER — SODIUM CHLORIDE 0.9 % IV BOLUS
500.0000 mL | Freq: Once | INTRAVENOUS | Status: DC
Start: 2019-12-27 — End: 2020-01-01

## 2019-12-27 MED ORDER — VANCOMYCIN HCL 1250 MG/250ML IV SOLN
1250.0000 mg | Freq: Once | INTRAVENOUS | Status: AC
  Administered 2019-12-27: 1250 mg via INTRAVENOUS
  Filled 2019-12-27: qty 250

## 2019-12-27 MED ORDER — CHLORHEXIDINE GLUCONATE CLOTH 2 % EX PADS
6.0000 | MEDICATED_PAD | Freq: Every day | CUTANEOUS | Status: DC
  Administered 2019-12-28 – 2020-01-01 (×5): 6 via TOPICAL
  Filled 2019-12-27 (×2): qty 6

## 2019-12-27 MED ORDER — PENTAFLUOROPROP-TETRAFLUOROETH EX AERO
1.0000 "application " | INHALATION_SPRAY | CUTANEOUS | Status: DC | PRN
  Filled 2019-12-27: qty 30

## 2019-12-27 MED ORDER — PANTOPRAZOLE SODIUM 40 MG IV SOLR
40.0000 mg | Freq: Two times a day (BID) | INTRAVENOUS | Status: DC
  Administered 2019-12-27 – 2020-01-01 (×9): 40 mg via INTRAVENOUS
  Filled 2019-12-27 (×9): qty 40

## 2019-12-27 MED ORDER — METOPROLOL TARTRATE 25 MG PO TABS
25.0000 mg | ORAL_TABLET | Freq: Two times a day (BID) | ORAL | Status: DC
  Administered 2019-12-27 – 2020-01-01 (×10): 25 mg via ORAL
  Filled 2019-12-27 (×10): qty 1

## 2019-12-27 MED ORDER — SODIUM CHLORIDE 0.9 % IV SOLN: Freq: Once | INTRAVENOUS | Status: AC

## 2019-12-27 MED ORDER — LIDOCAINE HCL (PF) 1 % IJ SOLN
5.0000 mL | INTRAMUSCULAR | Status: DC | PRN
  Filled 2019-12-27: qty 5

## 2019-12-27 MED ORDER — ACETAMINOPHEN 325 MG PO TABS
650.0000 mg | ORAL_TABLET | ORAL | Status: DC | PRN
  Administered 2019-12-27 – 2019-12-28 (×3): 650 mg via ORAL
  Filled 2019-12-27 (×3): qty 2

## 2019-12-27 MED ORDER — INSULIN ASPART 100 UNIT/ML ~~LOC~~ SOLN
0.0000 [IU] | Freq: Three times a day (TID) | SUBCUTANEOUS | Status: DC
  Administered 2019-12-27: 2 [IU] via SUBCUTANEOUS
  Administered 2019-12-27 – 2019-12-28 (×2): 1 [IU] via SUBCUTANEOUS
  Administered 2019-12-29: 3 [IU] via SUBCUTANEOUS
  Administered 2019-12-30: 1 [IU] via SUBCUTANEOUS
  Administered 2019-12-30 (×2): 2 [IU] via SUBCUTANEOUS
  Administered 2019-12-31 (×2): 3 [IU] via SUBCUTANEOUS
  Administered 2020-01-01: 2 [IU] via SUBCUTANEOUS
  Administered 2020-01-01: 7 [IU] via SUBCUTANEOUS
  Filled 2019-12-27 (×12): qty 1

## 2019-12-27 MED ORDER — APIXABAN 2.5 MG PO TABS: 2.5000 mg | ORAL_TABLET | Freq: Two times a day (BID) | ORAL | Status: DC

## 2019-12-27 MED ORDER — ASCORBIC ACID 500 MG PO TABS
1000.0000 mg | ORAL_TABLET | Freq: Two times a day (BID) | ORAL | Status: DC
  Administered 2019-12-27 – 2020-01-01 (×9): 1000 mg via ORAL
  Filled 2019-12-27 (×9): qty 2

## 2019-12-27 MED ORDER — TESTOSTERONE CYPIONATE 200 MG/ML IM SOLN: 200.0000 mg | INTRAMUSCULAR | Status: DC

## 2019-12-27 MED ORDER — IPRATROPIUM-ALBUTEROL 0.5-2.5 (3) MG/3ML IN SOLN: 3.0000 mL | RESPIRATORY_TRACT | Status: DC | PRN

## 2019-12-27 MED ORDER — LEVOTHYROXINE SODIUM 100 MCG PO TABS
100.0000 ug | ORAL_TABLET | Freq: Every day | ORAL | Status: DC
  Administered 2019-12-27 – 2020-01-01 (×3): 100 ug via ORAL
  Filled 2019-12-27 (×2): qty 1
  Filled 2019-12-27: qty 2

## 2019-12-27 MED ORDER — SODIUM CHLORIDE 0.9 % IV SOLN
1.0000 g | INTRAVENOUS | Status: DC
  Administered 2019-12-28 – 2019-12-29 (×2): 1 g via INTRAVENOUS
  Filled 2019-12-27 (×2): qty 1

## 2019-12-27 MED ORDER — SODIUM CHLORIDE 0.9% IV SOLUTION: Freq: Once | INTRAVENOUS | Status: AC

## 2019-12-27 MED ORDER — SODIUM CHLORIDE 0.9 % IV SOLN: INTRAVENOUS | Status: DC

## 2019-12-27 MED ORDER — AMLODIPINE BESYLATE 5 MG PO TABS
5.0000 mg | ORAL_TABLET | Freq: Every day | ORAL | Status: DC
  Administered 2019-12-29 – 2020-01-01 (×4): 5 mg via ORAL
  Filled 2019-12-27 (×4): qty 1

## 2019-12-27 MED ORDER — SODIUM CHLORIDE 0.9 % IV SOLN
2.0000 g | Freq: Once | INTRAVENOUS | Status: AC
  Administered 2019-12-27: 2 g via INTRAVENOUS
  Filled 2019-12-27: qty 2

## 2019-12-27 MED ORDER — LIDOCAINE-PRILOCAINE 2.5-2.5 % EX CREA
1.0000 "application " | TOPICAL_CREAM | CUTANEOUS | Status: DC | PRN
  Filled 2019-12-27: qty 5

## 2019-12-27 MED ORDER — SODIUM CHLORIDE 0.9 % IV SOLN: 250.0000 mL | INTRAVENOUS | Status: DC | PRN

## 2019-12-27 MED ORDER — LACTATED RINGERS IV SOLN: INTRAVENOUS | Status: AC

## 2019-12-27 MED ORDER — ATORVASTATIN CALCIUM 20 MG PO TABS
40.0000 mg | ORAL_TABLET | Freq: Every day | ORAL | Status: DC
  Administered 2019-12-28 – 2019-12-31 (×4): 40 mg via ORAL
  Filled 2019-12-27 (×5): qty 2

## 2019-12-27 MED ORDER — HEPARIN SODIUM (PORCINE) 1000 UNIT/ML DIALYSIS
1000.0000 [IU] | INTRAMUSCULAR | Status: DC | PRN
  Filled 2019-12-27: qty 1

## 2019-12-27 MED ORDER — DEXTROSE 50 % IV SOLN
INTRAVENOUS | Status: AC
  Filled 2019-12-27: qty 50

## 2019-12-27 MED ORDER — FUROSEMIDE 10 MG/ML IJ SOLN
40.0000 mg | Freq: Two times a day (BID) | INTRAMUSCULAR | Status: DC
  Administered 2019-12-28 – 2020-01-01 (×9): 40 mg via INTRAVENOUS
  Filled 2019-12-27 (×9): qty 4

## 2019-12-27 MED ORDER — HEPARIN SODIUM (PORCINE) 5000 UNIT/ML IJ SOLN: 5000.0000 [IU] | Freq: Three times a day (TID) | INTRAMUSCULAR | Status: DC

## 2019-12-27 MED ORDER — VANCOMYCIN VARIABLE DOSE PER UNSTABLE RENAL FUNCTION (PHARMACIST DOSING): Status: DC

## 2019-12-27 MED ORDER — VANCOMYCIN HCL IN DEXTROSE 1-5 GM/200ML-% IV SOLN
1000.0000 mg | Freq: Once | INTRAVENOUS | Status: AC
Start: 2019-12-27 — End: 2019-12-27
  Administered 2019-12-27: 1000 mg via INTRAVENOUS
  Filled 2019-12-27: qty 200

## 2019-12-27 MED ORDER — ALTEPLASE 2 MG IJ SOLR
2.0000 mg | Freq: Once | INTRAMUSCULAR | Status: DC | PRN
  Filled 2019-12-27: qty 2

## 2019-12-27 MED ORDER — CALCITRIOL 0.25 MCG PO CAPS
0.5000 ug | ORAL_CAPSULE | Freq: Every day | ORAL | Status: DC
  Administered 2019-12-27 – 2020-01-01 (×5): 0.5 ug via ORAL
  Filled 2019-12-27 (×6): qty 2

## 2019-12-27 NOTE — ED Notes (Signed)
Attempted to obtain oral temp x3, axillary x2 unsuccessfully

## 2019-12-27 NOTE — ED Notes (Signed)
Pt was undergoing peritoneal dialysis at time of EMS callout, was approx halfway through

## 2019-12-27 NOTE — ED Notes (Signed)
PT HAS FISTULA ON LEFT FOREARM, NO USE THAT ARM

## 2019-12-27 NOTE — ED Notes (Signed)
Lasix held due to bp, Dr. Sidney Ace made aware. Will hold till bp improves, sats 96% at present on bipap.

## 2019-12-27 NOTE — ED Notes (Signed)
Dr. Sidney Ace in to admit

## 2019-12-27 NOTE — ED Notes (Signed)
Assisted pt to toilet for BM

## 2019-12-27 NOTE — ED Notes (Signed)
Pt off bipap and placed on 6L [place] to eat breakfast- pt O2 sats stay stable at 100%

## 2019-12-27 NOTE — ED Notes (Signed)
Pt moved to hospital bed for comfort.

## 2019-12-27 NOTE — Care Plan (Signed)
Patrick Hurst  is a 64 y.o. Caucasian male with a known history of end-stage renal disease on peritoneal dialysis, CHF, COPD, atrial fibrillation, depression, GERD and gout and obstructive sleep apnea on CPAP, who presented to the emergency room with acute onset of dyspnea with Altered mental status and decreased responsiveness.  Patient had a blood glucose of 52 by EMS.  He was given 200 mL of IV D10W without significant improvement in his mental status.  He was then given 3 mg of IV Narcan and was more alert and conversant.  He was better after he came to the ER however became somnolent again with sonorous respirations.  He was found to be hypoxemic by EMS as well with pulse currently in the mid to upper 80s for which he was placed on nonrebreather and pulse currently was up to 93%.  In the ER he received an ampule of D 50 as his blood glucose was again 61.  He has been fully vaccinated for COVID-19. He denied any significant cough or wheezing. No nausea or vomiting or abdominal pain. No chest pain no palpitations.  Upon presentation to the emergency room, blood pressure was 95/47 pulse oximetry of 94% on 100% nonrebreather and otherwise normal vital signs..  He was ordered BiPAP.  Labs revealed ABG with pH 7.13 and bicarbonate of 22.  CMP showed a BUN of 99 and creatinine of 17.5 with a low glucose of 61 anion gap of 22 and CO2 25.  Albumin was 3.2 total protein of 7.6.  BNP was 319.9 lactic acid 0.8 and later 0.64 procalcitonin of 1.35.  CBC showed significant anemia with hemoglobin of 5.7 hematocrit 17.9 compared to 8.5 and 24.3 about a month ago.  INR is 1.3 and PT 15.7.  Urine drug screen was positive for tricyclics.  Blood cultures were drawn.  Chest x-ray showed cardiomegaly with findings of CHF that is new compared to previous chest x-ray.  There was no focal consolidation.  The patient was empirically given IV vancomycin and cefepime, and amp of D50 lactated Ringer at 150 mL/h.  He will be admitted  to the progressive unit bed for further evaluation and management.  Patient was seen and examined at the bedside in the ED.  Patient was on BiPAP more alert awake, wife was at bedside all the questions answered.  Acute encephalopathy that is multifactorial >>>> Improved This likely secondary to hypoglycemia as well as hypoxemia in addition to uremia with end-stage renal disease on peritoneal dialysis. -The patient will be admitted to a progressive unit bed. -Nephrology consulted,  Patient is going to have hemodialysis today. -Hypoglycemia protocol will be followed. -Long-acting and high-dose insulin will be held off. -O2 protocol will be followed and BiPAP will be continued as needed especially with history of obstructive sleep apnea.  2.  Acute on chronic diastolic CHF with subsequent hypoxic and possibly hypercarbic acute respiratory failure contributing to acute encephalopathy and worsening by his symptomatic anemia. -Continue BiPAP and monitor ABG as needed. -The patient will be gently diuresed with IV Lasix pending dialysis. -He had a recent 2D echo on 11/16/2019 that revealed an EF of 60 to 65% with grade 2 diastolic dysfunction. -Obtain stool Hemoccult. -Patient is going to get the blood transfusion during hemodialysis.  3.  Symptomatic anemia; Patient presented with a Hb of 5.7, he is going to get blood transfusion during hemodialysis. GI consulted, patient needs upper GI endoscopy tomorrow morning. Patient is going to have colonoscopy if it is negative  3.  Type II diabetes mellitus. -The patient will be placed on supplement coverage with NovoLog and will hold off U5 100 for now.  4.  Hypertension. -Norvasc will be placed with holding parameters. -Elavil will be held off.  5.  Atrial fibrillation. -We will continue amiodarone and to hold his Eliquis pending stool Hemoccult given symptomatic anemia.  6.  DVT prophylaxis. -SCDs. -Medical prophylaxis currently held off  pending stool Hemoccult given symptomatic anemia

## 2019-12-27 NOTE — Progress Notes (Signed)
Hassan Rowan, NP at bedside with pt. Aware of pt having elevated heart rate that is fluctuating from 110's -120's at times. Will just continue to monitor closely.

## 2019-12-27 NOTE — Progress Notes (Signed)
Peritoneal Dialysis patient known at [facility]. Please contact me with dialysis placement concerns.   Elvera Bicker  Dialysis Coordinator [phone]

## 2019-12-27 NOTE — Progress Notes (Signed)
[facility]  ROUNDING NOTE   Subjective:  Patient well-known to [place].  Presents now with increasing shortness of breath, hypothermia, and significant anemia. Was previously off of Epogen given concerns for a liver lesion however more recent imaging study was negative for this. Patient normally performs peritoneal dialysis but intermittently has to undergo hemodialysis for volume overload as well.   Objective:  Vital signs in last 24 hours:  Temp:  [97.7 F (36.5 C)] 97.7 F (36.5 C) (08/26 0832) Pulse Rate:  [62-87] 69 (08/26 0900) Resp:  [11-20] 11 (08/26 0900) BP: (98-125)/(58-75) 125/75 (08/26 0900) SpO2:  [94 %-100 %] 100 % (08/26 1010) Weight:  [135.6 kg] 135.6 kg (08/26 0051)  Weight change:  Filed Weights   12/27/19 0051  Weight: 135.6 kg    Intake/Output: No intake/output data recorded.   Intake/Output this shift:  Total I/O In: 250 [IV Piggyback:250] Out: -   Physical Exam: General:  Currently on BiPAP.  Head:  Normocephalic, atraumatic. Moist oral mucosal membranes  Eyes:  Anicteric  Neck:  Supple  Lungs:   Basilar rales  Heart:  S1S2 no rubs  Abdomen:   Soft, distended, bowel sounds present  Extremities:  3+ peripheral edema.  Neurologic:  Awake, alert, following commands  Skin:  No lesions  Access:  Right IJ PermCath, PD catheter    Basic Metabolic Panel: Recent Labs  Lab 12/27/19 0054  NA 140  K 4.5  CL 93*  CO2 25  GLUCOSE 61*  BUN 99*  CREATININE 17.58*  CALCIUM 6.7*    Liver Function Tests: Recent Labs  Lab 12/27/19 0054  AST 15  ALT 18  ALKPHOS 114  BILITOT 0.9  PROT 7.6  ALBUMIN 3.2*   Recent Labs  Lab 12/27/19 0054  LIPASE 29   No results for input(s): AMMONIA in the last 168 hours.  CBC: Recent Labs  Lab 12/27/19 0054 12/27/19 0426  WBC 9.4 10.7*  NEUTROABS 7.1  --   HGB 5.7* 5.4*  HCT 17.9* 17.0*  MCV 96.2 96.0  PLT 109* 111*    Cardiac Enzymes: No results for input(s): CKTOTAL, CKMB,  CKMBINDEX, TROPONINI in the last 168 hours.  BNP: Invalid input(s): POCBNP  CBG: Recent Labs  Lab 12/27/19 0127 12/27/19 0157 12/27/19 0255 12/27/19 0533 12/27/19 0831  GLUCAP 111* 98 84 133* 171*    Microbiology: Results for orders placed or performed during the hospital encounter of 12/27/19  SARS Coronavirus 2 by RT PCR (hospital order, performed in [facility] hospital lab) Nasopharyngeal Nasopharyngeal Swab     Status: None   Collection Time: 12/27/19 12:54 AM   Specimen: Nasopharyngeal Swab  Result Value Ref Range Status   SARS Coronavirus 2 NEGATIVE NEGATIVE Final    Comment: (NOTE) SARS-CoV-2 target nucleic acids are NOT DETECTED.  The SARS-CoV-2 RNA is generally detectable in upper and lower respiratory specimens during the acute phase of infection. The lowest concentration of SARS-CoV-2 viral copies this assay can detect is 250 copies / mL. A negative result does not preclude SARS-CoV-2 infection and should not be used as the sole basis for treatment or other patient management decisions.  A negative result may occur with improper specimen collection / handling, submission of specimen other than nasopharyngeal swab, presence of viral mutation(s) within the areas targeted by this assay, and inadequate number of viral copies (<250 copies / mL). A negative result must be combined with clinical observations, patient history, and epidemiological information.  Fact Sheet for Patients:   StrictlyIdeas.no  Fact Sheet for Healthcare Providers: BankingDealers.co.za  This test is not yet approved or  cleared by the [place] FDA and has been authorized for detection and/or diagnosis of SARS-CoV-2 by FDA under an Emergency Use Authorization (EUA).  This EUA will remain in effect (meaning this test can be used) for the duration of the COVID-19 declaration under Section 564(b)(1) of the Act, 21 U.S.C. section  360bbb-3(b)(1), unless the authorization is terminated or revoked sooner.  Performed at [facility], [address] [place], [place] [address]   Blood culture (routine x 2)     Status: None (Preliminary result)   Collection Time: 12/27/19  3:07 AM   Specimen: BLOOD  Result Value Ref Range Status   Specimen Description BLOOD RIGHT HAND  Final   Special Requests   Final    BOTTLES DRAWN AEROBIC AND ANAEROBIC Blood Culture adequate volume   Culture   Final    NO GROWTH < 12 HOURS Performed at [facility], [address] [place], [place] [address]    Report Status PENDING  Incomplete  Blood culture (routine x 2)     Status: None (Preliminary result)   Collection Time: 12/27/19  3:07 AM   Specimen: BLOOD  Result Value Ref Range Status   Specimen Description BLOOD RIGHT [facility]  Final   Special Requests   Final    BOTTLES DRAWN AEROBIC AND ANAEROBIC Blood Culture adequate volume   Culture   Final    NO GROWTH < 12 HOURS Performed at [facility], [address] [place], [place] [address]    Report Status PENDING  Incomplete    Coagulation Studies: Recent Labs    12/27/19 0054  LABPROT 15.7*  INR 1.3*    Urinalysis: No results for input(s): COLORURINE, LABSPEC, PHURINE, GLUCOSEU, HGBUR, BILIRUBINUR, KETONESUR, PROTEINUR, UROBILINOGEN, NITRITE, LEUKOCYTESUR in the last 72 hours.  Invalid input(s): APPERANCEUR    Imaging: DG Chest Portable 1 View  Result Date: 12/27/2019 CLINICAL DATA:  64 year old male with shortness of breath. EXAM: PORTABLE CHEST 1 VIEW COMPARISON:  Chest radiograph dated 11/29/2019. FINDINGS: Right-sided dialysis catheter in similar position. There is cardiomegaly with vascular congestion and mild edema, new since the prior radiograph. No focal consolidation, pleural effusion, or pneumothorax. No acute osseous pathology. IMPRESSION: Cardiomegaly with findings of CHF, new since the prior radiograph. No focal consolidation.  Electronically Signed   By: Anner Crete M.D.   On: 12/27/2019 01:17     Medications:   . sodium chloride    . sodium chloride    . sodium chloride    . sodium chloride    . [START ON 12/28/2019] ceFEPime (MAXIPIME) IV    . lactated ringers 150 mL/hr at 12/27/19 0316  . sodium chloride     . amiodarone  200 mg Oral Daily  . amLODipine  5 mg Oral Daily  . vitamin C  1,000 mg Oral BID  . atorvastatin  40 mg Oral Daily  . calcitRIOL  0.5 mcg Oral Daily  . Chlorhexidine Gluconate Cloth  6 each Topical Q0600  . febuxostat  80 mg Oral Daily  . furosemide  40 mg Intravenous Q12H  . insulin aspart  0-9 Units Subcutaneous TID PC & HS  . lactulose  20 g Oral Daily  . levothyroxine  100 mcg Oral QAC breakfast  . metoprolol tartrate  25 mg Oral BID  . pantoprazole  40 mg Oral Daily  . sodium chloride flush  3 mL Intravenous Q12H  .  vancomycin variable dose per unstable renal function (pharmacist dosing)   Does not apply See admin instructions   sodium chloride, sodium chloride, sodium chloride, acetaminophen, alteplase, heparin, ipratropium-albuterol, lidocaine (PF), lidocaine-prilocaine, pentafluoroprop-tetrafluoroeth, sodium chloride flush  Assessment/ Plan:  64 y.o. male with past medical history of ESRD on PD with intermittent hemodialysis for volume overload, chronic diastolic heart failure, Bell's palsy, carpal tunnel syndrome, depression, GERD, gout, anemia of chronic kidney disease, secondary hyper parathyroidism, chronic respiratory failure who presents now with increasing shortness of breath, volume overload, acute respiratory failure, severe anemia.  [facility] with use of intermittent hemodialysis CCPD 9 hours 5 exchanges  1.  ESRD mainly on peritoneal dialysis with intermittent backup hemodialysis.  Patient volume overloaded in the setting of severe anemia.  We will plan for hemodialysis treatment with ultrafiltration target of 1 to 1.5 kg.  We  will perform more aggressive ultrafiltration once his blood pressure stabilizes a bit as well as his anemia.  2.  Anemia of chronic kidney disease with possible GI blood loss.  Hemoglobin quite low at the moment at 5.4.  Blood transfusion being planned.  Recommend GI evaluation.  3.  Secondary hyperparathyroidism.  Monitor PTH and phosphorus over the course of hospitalization.  4.  Acute respiratory failure.  Likely due to volume overload from insufficient ultrafiltration with peritoneal dialysis.  Currently on BiPAP.  We will plan for ultrafiltration target of 1 to 1.5 kg today but with dialysis treatment.   LOS: 0 Jahking Lesser 8/26/202110:26 AM

## 2019-12-27 NOTE — ED Notes (Signed)
Pt provided dinner, assisted pt to side of bed to eat. Wife at bedside

## 2019-12-27 NOTE — Progress Notes (Addendum)
Patrick Antigua, Hurst [address], [place], [place], [place], [address] [address], [place], [place], [place], [address] Phone: [phone]  Fax: [phone]  Consultation  Referring Provider:     Dr. Dwyane Hurst Primary Care Physician:  Patrick Sons, Hurst Reason for Consultation:    Anemia  Date of Admission:  12/27/2019 Date of Consultation:  12/27/2019         HPI:   [name] is a 64 y.o. male with a history of ESRD on hemodialysis, with GI consulted for anemia.  Patient reports having 3-4 episodes of melanotic stool at home yesterday.  On presentation to the ER patient was minimally responsive with low blood glucose.  He was given D10 and also Narcan given history of opioid use.  Patient was initially on BiPAP but is now on nasal cannula and is able to provide reliable history during my conversation with him.  No nausea or vomiting.  Denies any recent NSAID use.  Patient has chronically low hemoglobin with baseline of 8-9 and is on Epogen  Past Medical History:  Diagnosis Date  . Acute on chronic diastolic CHF (congestive heart failure) ([place]) 06/21/2018  . Acute respiratory failure with hypoxia ([place]) 06/21/2018  . Bell palsy 02/12/2015  . Carpal tunnel syndrome 02/12/2015  . Dependence on nocturnal oxygen therapy    2 LITERS WITH BIPAP  . Depression   . Gastric ulcer   . GERD (gastroesophageal reflux disease)   . Gout   . History of chicken pox   . Multifocal pneumonia 03/04/2019  . Sleep apnea treated with nocturnal BiPAP     Past Surgical History:  Procedure Laterality Date  . AV FISTULA PLACEMENT Left 11/08/2019   Procedure: ARTERIOVENOUS (AV) FISTULA CREATION (RADIALCEPHALIC);  Surgeon: Hurst Huxley, Hurst;  Location: [facility] ORS;  Service: Vascular;  Laterality: Left;  . CATARACT EXTRACTION Bilateral 2014 and 2015  . CHOLECYSTECTOMY  04/28/2011   Laproscopic; Dr. Pat Hurst  . DIALYSIS/PERMA CATHETER INSERTION N/A 05/24/2019   Procedure: DIALYSIS/PERMA CATHETER  INSERTION;  Surgeon: Hurst Huxley, Hurst;  Location: [place] [facility];  Service: Cardiovascular;  Laterality: N/A;  . DIALYSIS/PERMA CATHETER INSERTION N/A 10/09/2019   Procedure: DIALYSIS/PERMA CATHETER INSERTION;  Surgeon: Patrick Cabal, Hurst;  Location: [place] [facility];  Service: Cardiovascular;  Laterality: N/A;  . EYE SURGERY    . GALLBLADDER SURGERY    . LASIK Bilateral 2019   medical  . SHOULDER SURGERY Right 2012   Dr. Leanor Hurst    Prior to Admission medications   Medication Sig Start Date End Date Taking? Authorizing Provider  amiodarone (PACERONE) 200 MG tablet Take 200 mg by mouth daily. 08/16/19  Yes Hurst Hurst  amitriptyline (ELAVIL) 25 MG tablet TAKE 1 TABLET AT BEDTIME Patient taking differently: Take 25 mg by mouth at bedtime.  07/26/19  Yes Patrick Sons, Hurst  Ascorbic Acid (VITAMIN C) 1000 MG tablet Take 1,000 mg by mouth 2 (two) times daily.    Yes Hurst Hurst  atorvastatin (LIPITOR) 40 MG tablet TAKE 1 TABLET DAILY Patient taking differently: Take 40 mg by mouth daily.  10/15/19  Yes Patrick Sons, Hurst  Budeson-Glycopyrrol-Formoterol (BREZTRI AEROSPHERE) 160-9-4.8 MCG/ACT AERO Inhale 2 puffs into the lungs in the morning and at bedtime.    Yes Hurst Hurst  calcitRIOL (ROCALTROL) 0.5 MCG capsule Take 0.5 mcg by mouth daily. 12/29/18  Yes Hurst Hurst  cetirizine (ZYRTEC) 10 MG tablet Take 10 mg by  mouth daily as needed for allergies.    Yes Hurst Hurst  ELIQUIS 2.5 MG TABS tablet Take 2.5 mg by mouth 2 (two) times daily.  03/23/19  Yes Hurst Hurst  insulin regular human CONCENTRATED (HUMULIN R) 500 UNIT/ML injection Inject up to 25 units twice a day, or as directed by physician Patient taking differently: Inject 50 Units into the skin 2 (two) times daily with a meal. 60 UNITS IN THE AM & 50 UNITS AT NIGHT 01/05/19  Yes Patrick Sons, Hurst  lactulose (CHRONULAC) 10 GM/15ML solution  Take 30 mLs by mouth daily. 09/10/19  Yes Hurst Hurst  levothyroxine (SYNTHROID) 100 MCG tablet TAKE 1 TABLET DAILY Patient taking differently: Take 100 mcg by mouth daily before breakfast.  08/01/19  Yes Fisher, Kirstie Peri, Hurst  metoprolol tartrate (LOPRESSOR) 25 MG tablet Take 50 mg by mouth 2 (two) times daily.   Yes Hurst Hurst  pantoprazole (PROTONIX) 40 MG tablet TAKE 1 TABLET TWICE A DAY Patient taking differently: Take 40 mg by mouth daily.  02/10/19  Yes Patrick Sons, Hurst  torsemide (DEMADEX) 10 MG tablet Take 40 mg by mouth 2 (two) times daily.  08/13/19  Yes Hurst Hurst  ULORIC 80 MG TABS Take 80 mg by mouth daily.  05/18/17  Yes Hurst Hurst  amLODipine (NORVASC) 5 MG tablet Take 1 tablet (5 mg total) by mouth daily. Patient not taking: Reported on 12/27/2019 11/12/19   Patrick Sons, Hurst  BD INSULIN SYRINGE U/F 31G X 5/16" 1 ML MISC  11/18/19   Hurst Hurst  cephALEXin (KEFLEX) 500 MG capsule Take 500 mg by mouth daily. Patient not taking: Reported on 12/27/2019 12/18/19   Hurst Hurst  gentamicin cream (GARAMYCIN) 0.1 % Apply 1 application topically daily. 09/22/19   Hurst Hurst  Ipratropium-Albuterol (COMBIVENT RESPIMAT) 20-100 MCG/ACT AERS respimat Inhale 2 puffs into the lungs every 4 (four) hours as needed for wheezing. Patient not taking: Reported on 12/21/2019    Hurst Hurst  Spacer/Aero-Holding Chambers (AEROCHAMBER PLUS WITH MASK) inhaler See admin instructions. 12/05/19   Hurst Hurst  Spacer/Aero-Holding Josiah Lobo (EASIVENT) inhaler See admin instructions. 12/05/19 12/04/20  Hurst Hurst    Family History  Problem Relation Age of Onset  . COPD Mother   . Cancer Mother   . Heart disease Father      Social History   Tobacco Use  . Smoking status: Former Smoker    Packs/day: 1.00    Years: 15.00    Pack years: 15.00    Types: Cigarettes    Quit date:  05/03/1978    Years since quitting: 41.6  . Smokeless tobacco: Never Used  . Tobacco comment: started smoking at age 47  Vaping Use  . Vaping Use: Never used  Substance Use Topics  . Alcohol use: No    Alcohol/week: 0.0 standard drinks  . Drug use: No    Allergies as of 12/27/2019 - Review Complete 12/27/2019  Allergen Reaction Noted  . Mucinex [guaifenesin er] Swelling 02/12/2015  . Levaquin [levofloxacin] Palpitations 05/14/2015    Review of Systems:    All systems reviewed and negative except where noted in HPI.   Physical Exam:  Vital signs in last 24 hours: Vitals:   12/27/19 1315 12/27/19 1330 12/27/19 1345 12/27/19 1400  BP: 129/73 (!) 141/61 138/85 (!) 142/74  Pulse: 68 66 72 77  Resp:      Temp:  TempSrc:      SpO2:      Weight:      Height:         General:   Pleasant, cooperative in NAD Head:  Normocephalic and atraumatic. Eyes:   No icterus.   Conjunctiva pink. PERRLA. Ears:  Normal auditory acuity. Neck:  Supple; no masses or thyroidomegaly Lungs: Respirations even and unlabored. Lungs clear to auscultation bilaterally.   No wheezes, crackles, or rhonchi.  Abdomen:  Soft, nondistended, nontender. Normal bowel sounds. No appreciable masses or hepatomegaly.  No rebound or guarding.  Neurologic:  Alert and oriented x3;  grossly normal neurologically. Skin:  Intact without significant lesions or rashes. Cervical Nodes:  No significant cervical adenopathy. Psych:  Alert and cooperative. Normal affect.  LAB RESULTS: Recent Labs    12/27/19 0054 12/27/19 0426  WBC 9.4 10.7*  HGB 5.7* 5.4*  HCT 17.9* 17.0*  PLT 109* 111*   BMET Recent Labs    12/27/19 0054  NA 140  K 4.5  CL 93*  CO2 25  GLUCOSE 61*  BUN 99*  CREATININE 17.58*  CALCIUM 6.7*   LFT Recent Labs    12/27/19 0054  PROT 7.6  ALBUMIN 3.2*  AST 15  ALT 18  ALKPHOS 114  BILITOT 0.9   PT/INR Recent Labs    12/27/19 0054  LABPROT 15.7*  INR 1.3*    STUDIES: DG  Chest Portable 1 View  Result Date: 12/27/2019 CLINICAL DATA:  64 year old male with shortness of breath. EXAM: PORTABLE CHEST 1 VIEW COMPARISON:  Chest radiograph dated 11/29/2019. FINDINGS: Right-sided dialysis catheter in similar position. There is cardiomegaly with vascular congestion and mild edema, new since the prior radiograph. No focal consolidation, pleural effusion, or pneumothorax. No acute osseous pathology. IMPRESSION: Cardiomegaly with findings of CHF, new since the prior radiograph. No focal consolidation. Electronically Signed   By: Anner Crete M.D.   On: 12/27/2019 01:17      Impression / Plan:   Hurst Hurst is a 64 y.o. y/o male with history of ESRD on hemodialysis, admitted with volume overload, anemia, unresponsive on EMS presentation and responded to Narcan  Patient reports melanotic stool at home yesterday No prior upper endoscopy  Plan on upper endoscopy tomorrow, if anesthesia agrees, after PRBC transfusion and dialysis today, to allow for medical optimization prior to the procedure  If upper endoscopy negative, colonoscopy can be done the following day  PPI IV twice daily  Continue serial CBCs and transfuse PRN Avoid NSAIDs Maintain 2 large-bore IV lines Please page GI with any acute hemodynamic changes, or signs of active GI bleeding  Low suspicion for variceal bleeding given clinical symptoms.  No hematemesis.  Patient hemodynamically stable and no further melena since presentation  I have discussed alternative options, risks & benefits,  which include, but are not limited to, bleeding, infection, perforation,respiratory complication & drug reaction.  The patient agrees with this plan & written consent will be obtained.    Thank you for involving me in the care of this patient.      LOS: 0 days   Virgel Manifold, Hurst  12/27/2019, 3:25 PM

## 2019-12-27 NOTE — ED Notes (Addendum)
Pt co headache, rates it 5/10 at this time.

## 2019-12-27 NOTE — Progress Notes (Signed)
NEW order received for pt to have another blood transfusion.

## 2019-12-27 NOTE — ED Provider Notes (Signed)
[facility] Emergency Department Provider Note  ____________________________________________   First MD Initiated Contact with Patient 12/27/19 [phone]     (approximate)  I have reviewed the triage vital signs and the nursing notes.   HISTORY  Chief Complaint Shortness of Breath  Level 5 caveat:  history/ROS limited by acute/critical illness  HPI Patrick Hurst is a 64 y.o. male with extensive medical history as listed below which includes a history of A. fib, COPD, prior episodes of respiratory failure with hypoxia, diabetes, and end-stage renal disease on peritoneal dialysis.   EMS was called to his home for evaluation of shortness of breath.  When they arrived they found that he was minimally responsive with sonorous respirations.  They checked a fingerstick blood glucose and found that he had a glucose of about 52.  They provided just over 200 mL of D10 and after initial treatment they did not have much of a response.  However they then provided Narcan 3 mg IV and the patient woke up and was conversant with them.  However by the time they were arriving to the emergency department the patient was again sonorous.  Of note the paramedics also indicate that he was hypoxemic with oxygen saturations in the mid to upper 80s and they placed him on a nonrebreather prior to his arrival at the emergency department which brought him up to about 93%.  His wife reported to paramedics that he has not been feeling well recently but without any specific complaints.  The patient is able to report that he has been fully vaccinated for COVID-19.  I woke him up on my initial assessment upon EMS arrival and the patient says "I feel great" before falling back to sleep.  He specifically denies shortness of breath and chest pain.  The patient denies the use of any opioids.  He denies chest and abdominal pain.  His symptoms were reportedly gradual in onset and severe and nothing in particular  made them better or worse other than the administration of Narcan which worked briefly.  There was no report at home of him using opioids chronically.        Past Medical History:  Diagnosis Date  . Acute on chronic diastolic CHF (congestive heart failure) ([place]) 06/21/2018  . Acute respiratory failure with hypoxia ([place]) 06/21/2018  . Bell palsy 02/12/2015  . Carpal tunnel syndrome 02/12/2015  . Dependence on nocturnal oxygen therapy    2 LITERS WITH BIPAP  . Depression   . Gastric ulcer   . GERD (gastroesophageal reflux disease)   . Gout   . History of chicken pox   . Multifocal pneumonia 03/04/2019  . Sleep apnea treated with nocturnal BiPAP     Patient Active Problem List   Diagnosis Date Noted  . Pneumonia due to infectious organism 11/15/2019  . Dependence on nocturnal oxygen therapy   . Vitamin D deficiency   . ESRD on dialysis ([place]) 06/01/2019  . History of atrial fibrillation   . Respiratory failure with hypoxia ([place]) 03/01/2019  . COPD (chronic obstructive pulmonary disease) ([place]) 12/08/2018  . Diabetic retinopathy of both eyes without macular edema associated with type 2 diabetes mellitus ([place]) 10/20/2018  . Secondary hyperparathyroidism of renal origin ([place]) 06/20/2018  . Chronic diastolic CHF (congestive heart failure) ([place]) 02/27/2018  . Psoriasis (a type of skin inflammation) 10/19/2016  . Primary osteoarthritis of both knees 10/01/2015  . Morbid (severe) obesity due to excess calories ([place]) 10/01/2015  . Splenomegaly 09/19/2015  .  ANA positive 07/21/2015  . Hepatic fibrosis 04/16/2015  . Charcot ankle 04/02/2015  . Thrombocytopenia ([place]) 02/14/2015  . Allergic rhinitis 02/12/2015  . Clinical depression 02/12/2015  . Diabetes mellitus with neuropathy ([place]) 02/12/2015  . Erectile dysfunction 02/12/2015  . HLD (hyperlipidemia) 02/12/2015  . Hypertension 02/12/2015  . Hypothyroidism 02/12/2015  . Anemia, iron deficiency 02/12/2015  . Low testosterone  02/12/2015  . Peripheral neuropathy 02/12/2015  . Shortness of breath 02/12/2015  . Obstructive sleep apnea 02/12/2015  . Gout 05/05/2012    Past Surgical History:  Procedure Laterality Date  . AV FISTULA PLACEMENT Left 11/08/2019   Procedure: ARTERIOVENOUS (AV) FISTULA CREATION (RADIALCEPHALIC);  Surgeon: Algernon Huxley, MD;  Location: [facility] ORS;  Service: Vascular;  Laterality: Left;  . CATARACT EXTRACTION Bilateral 2014 and 2015  . CHOLECYSTECTOMY  04/28/2011   Laproscopic; Dr. Pat Patrick  . DIALYSIS/PERMA CATHETER INSERTION N/A 05/24/2019   Procedure: DIALYSIS/PERMA CATHETER INSERTION;  Surgeon: Algernon Huxley, MD;  Location: [place] [facility];  Service: Cardiovascular;  Laterality: N/A;  . DIALYSIS/PERMA CATHETER INSERTION N/A 10/09/2019   Procedure: DIALYSIS/PERMA CATHETER INSERTION;  Surgeon: Katha Cabal, MD;  Location: [place] [facility];  Service: Cardiovascular;  Laterality: N/A;  . EYE SURGERY    . GALLBLADDER SURGERY    . LASIK Bilateral 2019   medical  . SHOULDER SURGERY Right 2012   Dr. Leanor Kail    Prior to Admission medications   Medication Sig Start Date End Date Taking? Authorizing Provider  amiodarone (PACERONE) 200 MG tablet Take 200 mg by mouth daily. 08/16/19   [provider]  amitriptyline (ELAVIL) 25 MG tablet TAKE 1 TABLET AT BEDTIME Patient taking differently: Take 25 mg by mouth at bedtime.  07/26/19   [name], MD  amLODipine (NORVASC) 5 MG tablet Take 1 tablet (5 mg total) by mouth daily. 11/12/19   [name], MD  Ascorbic Acid (VITAMIN C) 1000 MG tablet Take 1,000 mg by mouth 2 (two) times daily.     [provider]  atorvastatin (LIPITOR) 40 MG tablet TAKE 1 TABLET DAILY 10/15/19   [name], MD  BD INSULIN SYRINGE U/F 31G X 5/16" 1 ML MISC  11/18/19   [provider]  Budeson-Glycopyrrol-Formoterol (BREZTRI AEROSPHERE) 160-9-4.8 MCG/ACT AERO Inhale 2 puffs into the lungs in the morning and at  bedtime. Patient not taking: Reported on 12/21/2019    [provider]  calcitRIOL (ROCALTROL) 0.5 MCG capsule Take 0.5 mcg by mouth daily. 12/29/18   [provider]  cephALEXin (KEFLEX) 500 MG capsule Take 500 mg by mouth daily. 12/18/19   [provider]  cetirizine (ZYRTEC) 10 MG tablet Take 10 mg by mouth daily.  Patient not taking: Reported on 12/21/2019    [provider]  ELIQUIS 2.5 MG TABS tablet Take 2.5 mg by mouth 2 (two) times daily.  03/23/19   [provider]  gentamicin cream (GARAMYCIN) 0.1 % Apply 1 application topically daily. 09/22/19   [provider]  insulin regular human CONCENTRATED (HUMULIN R) 500 UNIT/ML injection Inject up to 25 units twice a day, or as directed by physician Patient taking differently: Inject 50 Units into the skin 2 (two) times daily with a meal. 60 UNITS IN THE AM & 50 UNITS AT NIGHT 01/05/19   [name], MD  Ipratropium-Albuterol (COMBIVENT RESPIMAT) 20-100 MCG/ACT AERS respimat Inhale 1 puff into the lungs every 6 (six) hours. Patient not taking: Reported on 12/21/2019  [provider]  Ipratropium-Albuterol (COMBIVENT RESPIMAT) 20-100 MCG/ACT AERS respimat Inhale 2 puffs into the lungs every 4 (four) hours as needed for wheezing. Patient not taking: Reported on 12/21/2019    [provider]  lactulose (CHRONULAC) 10 GM/15ML solution Take 30 mLs by mouth daily. 09/10/19   [provider]  levothyroxine (SYNTHROID) 100 MCG tablet TAKE 1 TABLET DAILY Patient taking differently: Take 100 mcg by mouth daily before breakfast.  08/01/19   [name], MD  metoprolol tartrate (LOPRESSOR) 50 MG tablet Take 0.5 tablets (25 mg total) by mouth 2 (two) times daily. 03/23/19   [name], MD  pantoprazole (PROTONIX) 40 MG tablet TAKE 1 TABLET TWICE A DAY Patient taking differently: Take 40 mg by mouth daily.  02/10/19   [name], MD  Spacer/Aero-Holding Chambers  (AEROCHAMBER PLUS WITH MASK) inhaler See admin instructions. 12/05/19   [provider]  Spacer/Aero-Holding Josiah Lobo (EASIVENT) inhaler See admin instructions. 12/05/19 12/04/20  [provider]  testosterone cypionate (DEPO-TESTOSTERONE) 200 MG/ML injection Inject 1 mL (200 mg total) into the muscle every 14 (fourteen) days. 03/23/19   [name], MD  torsemide (DEMADEX) 20 MG tablet Take 40 mg by mouth daily. 08/13/19   [provider]  ULORIC 80 MG TABS Take 80 mg by mouth daily.  05/18/17   [provider]    Allergies Mucinex [guaifenesin er] and Levaquin [levofloxacin]  Family History  Problem Relation Age of Onset  . COPD Mother   . Cancer Mother   . Heart disease Father     Social History Social History   Tobacco Use  . Smoking status: Former Smoker    Packs/day: 1.00    Years: 15.00    Pack years: 15.00    Types: Cigarettes    Quit date: 05/03/1978    Years since quitting: 41.6  . Smokeless tobacco: Never Used  . Tobacco comment: started smoking at age 8  Vaping Use  . Vaping Use: Never used  Substance Use Topics  . Alcohol use: No    Alcohol/week: 0.0 standard drinks  . Drug use: No    Review of Systems Level 5 caveat:  history/ROS limited by acute/critical illness   ____________________________________________   PHYSICAL EXAM:  ED Triage Vitals  Enc Vitals Group     BP 12/27/19 0050 98/70     Pulse Rate 12/27/19 0050 79     Resp 12/27/19 0050 15     Temp --      Temp src --      SpO2 12/27/19 0050 94 %     Weight 12/27/19 0051 135.6 kg (298 lb 15.1 oz)     Height 12/27/19 0051 1.803 m (5\' 11" )     Head Circumference --      Peak Flow --      Pain Score 12/27/19 0051 0     Pain Loc --      Pain Edu? --      Excl. in [place]? --      Constitutional: Somnolent.  Patient responds to loud voice and noxious stimuli but then falls asleep again fairly quickly. Eyes: Conjunctivae are normal.  Pupils are pinpoint  bilaterally and minimally responsive to light. Head: Atraumatic. Nose: No congestion/rhinnorhea. Mouth/Throat: Patient is wearing a mask. Neck: No stridor.  No meningeal signs.   Cardiovascular: Patient has what appears to be a dialysis catheter in the right upper chest.  Normal rate, regular rhythm. Good peripheral circulation. Grossly normal  heart sounds. Respiratory: Normal respiratory effort.  No retractions. Gastrointestinal: Protuberant abdomen but this is apparently his baseline.  He does not respond to palpation with any indication of tenderness.  He has peritoneal dialysis hardware in place which is well-appearing. Musculoskeletal: Trace to 1+ pitting edema in bilateral lower extremities, no evidence of cellulitis. No gross deformities of extremities. Neurologic: Slurred speech when awake but patient is sonorous in general.  He is moving all 4 extremities and has no obvious focal neurological deficits but is unable to participate in a thorough neurological exam. Skin:  Skin is warm, mildly diaphoretic, and intact.   ____________________________________________   LABS (all labs ordered are listed, but only abnormal results are displayed)  Labs Reviewed  BLOOD GAS, VENOUS - Abnormal; Notable for the following components:      Result Value   pH, Ven 7.13 (*)    pCO2, Ven 66 (*)    pO2, Ven 52.0 (*)    Acid-base deficit 8.4 (*)    All other components within normal limits  URINE DRUG SCREEN, QUALITATIVE ([facility] ONLY) - Abnormal; Notable for the following components:   Tricyclic, Ur Screen POSITIVE (*)    All other components within normal limits  CBC WITH DIFFERENTIAL/PLATELET - Abnormal; Notable for the following components:   RBC 1.86 (*)    Hemoglobin 5.7 (*)    HCT 17.9 (*)    RDW 16.9 (*)    Platelets 109 (*)    nRBC 0.4 (*)    Lymphs Abs 0.5 (*)    Monocytes Absolute 1.1 (*)    Abs Immature Granulocytes 0.44 (*)    All other components within normal limits   COMPREHENSIVE METABOLIC PANEL - Abnormal; Notable for the following components:   Chloride 93 (*)    Glucose, Bld 61 (*)    BUN 99 (*)    Creatinine, Ser 17.58 (*)    Calcium 6.7 (*)    Albumin 3.2 (*)    GFR calc non Af Amer 3 (*)    GFR calc Af Amer 3 (*)    Anion gap 22 (*)    All other components within normal limits  BRAIN NATRIURETIC PEPTIDE - Abnormal; Notable for the following components:   B Natriuretic Peptide 319.9 (*)    All other components within normal limits  PROTIME-INR - Abnormal; Notable for the following components:   Prothrombin Time 15.7 (*)    INR 1.3 (*)    All other components within normal limits  GLUCOSE, CAPILLARY - Abnormal; Notable for the following components:   Glucose-Capillary 55 (*)    All other components within normal limits  GLUCOSE, CAPILLARY - Abnormal; Notable for the following components:   Glucose-Capillary 111 (*)    All other components within normal limits  SARS CORONAVIRUS 2 BY RT PCR (HOSPITAL ORDER, [place] [facility])  CULTURE, BLOOD (ROUTINE X 2)  CULTURE, BLOOD (ROUTINE X 2)  ETHANOL  LIPASE, BLOOD  LACTIC ACID, PLASMA  LACTIC ACID, PLASMA  GLUCOSE, CAPILLARY  PROCALCITONIN  GLUCOSE, CAPILLARY  OCCULT BLOOD X 1 CARD TO LAB, STOOL  CBG MONITORING, ED   ____________________________________________  EKG  ED ECG REPORT I, Hinda Kehr, the attending physician, personally viewed and interpreted this ECG.  Date: 12/27/2019 EKG Time: 00: 47 Rate: 84 Rhythm:  QRS Axis: normal Intervals: normal ST/T Wave abnormalities: Non-specific ST segment / T-wave changes, but no clear evidence of acute ischemia. Narrative Interpretation: no definitive evidence of acute ischemia; does not meet STEMI  criteria.   ____________________________________________  RADIOLOGY Ursula Alert, personally viewed and evaluated these images (plain radiographs) as part of my medical decision making, as well as reviewing the  written report by the radiologist.  ED MD interpretation: Evidence of CHF on chest x-ray, otherwise unremarkable  Official radiology report(s): DG Chest Portable 1 View  Result Date: 12/27/2019 CLINICAL DATA:  64 year old male with shortness of breath. EXAM: PORTABLE CHEST 1 VIEW COMPARISON:  Chest radiograph dated 11/29/2019. FINDINGS: Right-sided dialysis catheter in similar position. There is cardiomegaly with vascular congestion and mild edema, new since the prior radiograph. No focal consolidation, pleural effusion, or pneumothorax. No acute osseous pathology. IMPRESSION: Cardiomegaly with findings of CHF, new since the prior radiograph. No focal consolidation. Electronically Signed   By: Anner Crete M.D.   On: 12/27/2019 01:17    ____________________________________________   PROCEDURES   Procedure(s) performed (including Critical Care):  .1-3 Lead EKG Interpretation Performed by: Hinda Kehr, MD Authorized by: Hinda Kehr, MD     Interpretation: abnormal     ECG rate:  79   ECG rate assessment: normal     Rhythm: atrial fibrillation     Ectopy: none     Conduction: normal   .Critical Care Performed by: Hinda Kehr, MD Authorized by: Hinda Kehr, MD   Critical care provider statement:    Critical care time (minutes):  45   Critical care time was exclusive of:  Separately billable procedures and treating other patients   Critical care was necessary to treat or prevent imminent or life-threatening deterioration of the following conditions:  Respiratory failure and CNS failure or compromise   Critical care was time spent personally by me on the following activities:  Development of treatment plan with patient or surrogate, discussions with consultants, evaluation of patient's response to treatment, examination of patient, obtaining history from patient or surrogate, ordering and performing treatments and interventions, ordering and review of laboratory studies,  ordering and review of radiographic studies, pulse oximetry, re-evaluation of patient's condition and review of old charts     ____________________________________________   INITIAL IMPRESSION / MDM / [place] / ED COURSE  As part of my medical decision making, I reviewed the following data within the electronic MEDICAL RECORD NUMBER History obtained from family, Nursing notes reviewed and incorporated, Labs reviewed , EKG interpreted , Old chart reviewed, Radiograph reviewed , Discussed with admitting physician , Notes from prior ED visits and [place] Controlled Substance Database   Differential diagnosis includes, but is not limited to, opioid overdose (either accidental or intentional), heart failure exacerbation, pneumonia, COVID-19 or other viral illness, metabolic or electrolyte abnormality, sepsis.  The patient is on the cardiac monitor to evaluate for evidence of arrhythmia and/or significant heart rate changes.  Lab work is pending.  The patient is protecting his airway and is responsive to noxious stimuli and loud voice.  No indication for advanced airway management at this time but the patient is quite somnolent.  He may need additional doses of Narcan.  He states that he produces urine so we will send a urine specimen to check a urine drug screen.  According to the [facility] controlled substance database he does not have any active prescriptions for narcotics at this time but he is presenting as one would expect for narcotics overdose.  The patient is borderline hypotensive but he has received fluids in route (more than 200 mL) I will hold off on additional fluid bolus at this time until I am  more certain of his volume status including the possibility of pulmonary edema given that he is hypoxemic and requiring supplemental oxygen.  He remains hypoglycemic in the emergency department based on fingerstick glucose testing and I have ordered 1 amp of D50.  Extensive lab work and  imaging is pending.     Clinical Course as of Dec 27 414  Thu Dec 27, 2019  0130 Glucose-Capillary(!): 111 [CF]  0132 Lactic Acid, Venous: 0.8 [CF]  0218 SARS Coronavirus 2: NEGATIVE [CF]  0240 The patient's presentation, vital signs, and lab work are somewhat contradictory.  His comprehensive metabolic panel is essentially normal other than hypoglycemia which we have addressed separately and his baseline kidney failure.  His lactic acid is normal.  His CBC is notable for no leukocytosis but hemoglobin of 5.7 which is lower than it has been in the past but his wife, who is now at bedside, also notes that he has received blood transfusions in the past and just recently received an injection from his nephrologist for his red blood cells, presumably erythropoietin.Given that the patient has a rectal temperature of 94 degrees, increased oxygen requirement (satting 88% on 6 L of oxygen by nasal cannula), and mild hypotension, I am calling code sepsis and treating for hospital-acquired pneumonia given his admission 1 month ago for multifocal pneumonia and nonspecific findings on his chest x-ray.However, I believe that his hypotension is not necessarily acute; his wife says he has had a low blood pressure for the last month since his last admission.  His lactic acid is normal and I do not believe that he is presenting consistent with septic shock.  Additionally, given his findings of possible CHF on his chest x-ray, I believe that giving aggressive fluid boluses would be detrimental to him and for that reason I have opted to give him the fluids needed for his antibiotics as well as an infusion of 150 mL/h of lactated Ringer's but I will avoid aggressive fluid boluses that may likely lead to pulmonary edema and respiratory failure.Given the patient's mild hypercapnia, somnolence, sleep apnea, and use of positive pressure at night at home, I have called respiratory therapy and asked them to start him on BiPAP.  I am  treating empirically with cefepime 2 g IV and vancomycin per pharmacy consult.  The wife understands and agrees with the plan.  The patient continues to maintain his airway and protect his airway although he remains somnolent.   [CF]  [phone] Consulting the hospitalist for admission   [CF]  0247 No evidence of opiates on urine drug screen  Urine Drug Screen, Qualitative ([place] only)(!) [CF]  0302 Discussed case by phone with Dr. Sidney Ace with the hospitalist service who will admit.   [CF]    Clinical Course User Index [CF] Hinda Kehr, MD     ____________________________________________  FINAL CLINICAL IMPRESSION(S) / ED DIAGNOSES  Final diagnoses:  Acute on chronic respiratory failure with hypoxia and hypercapnia (HCC)  Hypotension, unspecified hypotension type  Sepsis, due to unspecified organism, unspecified whether acute organ dysfunction present ([place])  Hypothermia, initial encounter  ESRD on peritoneal dialysis [facility])     MEDICATIONS GIVEN DURING THIS VISIT:  Medications - No data to display   ED Discharge Orders    None      *Please note:  LORRIE STRAUCH was evaluated in Emergency Department on 12/27/2019 for the symptoms described in the history of present illness. He was evaluated in the context of the global COVID-19 pandemic, which necessitated  consideration that the patient might be at risk for infection with the SARS-CoV-2 virus that causes COVID-19. Institutional protocols and algorithms that pertain to the evaluation of patients at risk for COVID-19 are in a state of rapid change based on information released by regulatory bodies including the CDC and federal and state organizations. These policies and algorithms were followed during the patient's care in the ED.  Some ED evaluations and interventions may be delayed as a result of limited staffing during and after the pandemic.*  Note:  This document was prepared using Dragon voice recognition software and may include  unintentional dictation errors.   Hinda Kehr, MD 12/27/19 [phone]

## 2019-12-27 NOTE — NC FL2 (Signed)
Page LEVEL OF CARE SCREENING TOOL     IDENTIFICATION  Patient Name: Patrick Hurst Birthdate: 1955/06/29 Sex: male Admission Date (Current Location): 12/27/2019  [place] and [place] Number:  Engineering geologist and Address:  [facility], [address], [place], [place] [address]      Provider Number: [id number]  Attending Physician Name and Address:  Shawna Clamp, MD  Relative Name and Phone Number:  Delorean, Knutzen (Spouse) [phone]    Current Level of Care: Hospital Recommended Level of Care: [place] Prior Approval Number:    Date Approved/Denied:   PASRR Number: [id number]  Discharge Plan: SNF    Current Diagnoses: Patient Active Problem List   Diagnosis Date Noted  . Altered mental state 12/27/2019  . Pneumonia due to infectious organism 11/15/2019  . Dependence on nocturnal oxygen therapy   . Vitamin D deficiency   . ESRD on dialysis ([place]) 06/01/2019  . History of atrial fibrillation   . Respiratory failure with hypoxia ([place]) 03/01/2019  . COPD (chronic obstructive pulmonary disease) ([place]) 12/08/2018  . Diabetic retinopathy of both eyes without macular edema associated with type 2 diabetes mellitus ([place]) 10/20/2018  . Secondary hyperparathyroidism of renal origin ([place]) 06/20/2018  . Chronic diastolic CHF (congestive heart failure) ([place]) 02/27/2018  . Psoriasis (a type of skin inflammation) 10/19/2016  . Primary osteoarthritis of both knees 10/01/2015  . Morbid (severe) obesity due to excess calories ([place]) 10/01/2015  . Splenomegaly 09/19/2015  . ANA positive 07/21/2015  . Hepatic fibrosis 04/16/2015  . Charcot ankle 04/02/2015  . Thrombocytopenia ([place]) 02/14/2015  . Allergic rhinitis 02/12/2015  . Clinical depression 02/12/2015  . Diabetes mellitus with neuropathy ([place]) 02/12/2015  . Erectile dysfunction 02/12/2015  . HLD (hyperlipidemia) 02/12/2015  . Hypertension 02/12/2015  .  Hypothyroidism 02/12/2015  . Anemia, iron deficiency 02/12/2015  . Low testosterone 02/12/2015  . Peripheral neuropathy 02/12/2015  . Shortness of breath 02/12/2015  . Obstructive sleep apnea 02/12/2015  . Gout 05/05/2012    Orientation RESPIRATION BLADDER Height & Weight     Self, Time, Situation, Place  Normal Continent Weight: 298 lb 15.1 oz (135.6 kg) Height:  5\' 11"  (180.3 cm)  BEHAVIORAL SYMPTOMS/MOOD NEUROLOGICAL BOWEL NUTRITION STATUS      Continent Diet  AMBULATORY STATUS COMMUNICATION OF NEEDS Skin   Independent Verbally Normal                       Personal Care Assistance Level of Assistance              Functional Limitations Info  Sight, Hearing, Speech Sight Info: Adequate Hearing Info: Adequate Speech Info: Adequate    SPECIAL CARE FACTORS FREQUENCY                       Contractures Contractures Info: Not present    Additional Factors Info                  Current Medications (12/27/2019):  This is the current hospital active medication list Current Facility-Administered Medications  Medication Dose Route Frequency Provider Last Rate Last Admin  . 0.9 %  sodium chloride infusion  250 mL Intravenous PRN Mansy, Jan A, MD      . 0.9 %  sodium chloride infusion   Intravenous Once Mansy, Jan A, MD      . 0.9 %  sodium chloride infusion  100 mL Intravenous PRN Holley Raring, Munsoor, MD      .  0.9 %  sodium chloride infusion  100 mL Intravenous PRN Lateef, Munsoor, MD      . acetaminophen (TYLENOL) tablet 650 mg  650 mg Oral Q4H PRN Mansy, Jan A, MD   650 mg at 12/27/19 0616  . alteplase (CATHFLO ACTIVASE) injection 2 mg  2 mg Intracatheter Once PRN Holley Raring, Munsoor, MD      . amiodarone (PACERONE) tablet 200 mg  200 mg Oral Daily Mansy, Jan A, MD      . amLODipine (NORVASC) tablet 5 mg  5 mg Oral Daily Mansy, Jan A, MD      . ascorbic acid (VITAMIN C) tablet 1,000 mg  1,000 mg Oral BID Mansy, Jan A, MD   1,000 mg at 12/27/19 1021  .  atorvastatin (LIPITOR) tablet 40 mg  40 mg Oral Daily Mansy, Jan A, MD      . calcitRIOL (ROCALTROL) capsule 0.5 mcg  0.5 mcg Oral Daily Mansy, Jan A, MD   0.5 mcg at 12/27/19 1020  . [START ON 12/28/2019] ceFEPIme (MAXIPIME) 1 g in sodium chloride 0.9 % 100 mL IVPB  1 g Intravenous Q24H Mansy, Jan A, MD      . Chlorhexidine Gluconate Cloth 2 % PADS 6 each  6 each Topical Q0600 Lateef, Munsoor, MD      . febuxostat (ULORIC) tablet 80 mg  80 mg Oral Daily Mansy, Jan A, MD   80 mg at 12/27/19 1020  . furosemide (LASIX) injection 40 mg  40 mg Intravenous Q12H Mansy, Jan A, MD      . heparin injection 1,000 Units  1,000 Units Dialysis PRN Lateef, Munsoor, MD      . insulin aspart (novoLOG) injection 0-9 Units  0-9 Units Subcutaneous TID PC & HS Mansy, Arvella Merles, MD   2 Units at 12/27/19 0844  . ipratropium-albuterol (DUONEB) 0.5-2.5 (3) MG/3ML nebulizer solution 3 mL  3 mL Nebulization Q4H PRN Mansy, Jan A, MD      . lactated ringers infusion   Intravenous Continuous Hinda Kehr, MD 150 mL/hr at 12/27/19 0316 New Bag at 12/27/19 0316  . lactulose (CHRONULAC) 10 GM/15ML solution 20 g  20 g Oral Daily Mansy, Jan A, MD   20 g at 12/27/19 1021  . levothyroxine (SYNTHROID) tablet 100 mcg  100 mcg Oral QAC breakfast Mansy, Jan A, MD   100 mcg at 12/27/19 0843  . lidocaine (PF) (XYLOCAINE) 1 % injection 5 mL  5 mL Intradermal PRN Lateef, Munsoor, MD      . lidocaine-prilocaine (EMLA) cream 1 application  1 application Topical PRN Lateef, Munsoor, MD      . metoprolol tartrate (LOPRESSOR) tablet 25 mg  25 mg Oral BID Mansy, Jan A, MD   25 mg at 12/27/19 1021  . pantoprazole (PROTONIX) injection 40 mg  40 mg Intravenous Q12H Tahiliani, Margretta Sidle B, MD      . pentafluoroprop-tetrafluoroeth (GEBAUERS) aerosol 1 application  1 application Topical PRN Lateef, Munsoor, MD      . sodium chloride 0.9 % bolus 500 mL  500 mL Intravenous Once Hinda Kehr, MD      . sodium chloride flush (NS) 0.9 % injection 3 mL  3 mL  Intravenous Q12H Mansy, Jan A, MD      . sodium chloride flush (NS) 0.9 % injection 3 mL  3 mL Intravenous PRN Mansy, Jan A, MD      . vancomycin variable dose per unstable renal function (pharmacist dosing)   Does not apply See admin  instructions Mansy, Arvella Merles, MD       Current Outpatient Medications  Medication Sig Dispense Refill  . amiodarone (PACERONE) 200 MG tablet Take 200 mg by mouth daily.    Marland Kitchen amitriptyline (ELAVIL) 25 MG tablet TAKE 1 TABLET AT BEDTIME (Patient taking differently: Take 25 mg by mouth at bedtime. ) 90 tablet 3  . Ascorbic Acid (VITAMIN C) 1000 MG tablet Take 1,000 mg by mouth 2 (two) times daily.     Marland Kitchen atorvastatin (LIPITOR) 40 MG tablet TAKE 1 TABLET DAILY (Patient taking differently: Take 40 mg by mouth daily. ) 90 tablet 0  . Budeson-Glycopyrrol-Formoterol (BREZTRI AEROSPHERE) 160-9-4.8 MCG/ACT AERO Inhale 2 puffs into the lungs in the morning and at bedtime.     . calcitRIOL (ROCALTROL) 0.5 MCG capsule Take 0.5 mcg by mouth daily.    . cetirizine (ZYRTEC) 10 MG tablet Take 10 mg by mouth daily as needed for allergies.     Marland Kitchen ELIQUIS 2.5 MG TABS tablet Take 2.5 mg by mouth 2 (two) times daily.     . insulin regular human CONCENTRATED (HUMULIN R) 500 UNIT/ML injection Inject up to 25 units twice a day, or as directed by physician (Patient taking differently: Inject 50 Units into the skin 2 (two) times daily with a meal. 60 UNITS IN THE AM & 50 UNITS AT NIGHT) 60 mL 3  . lactulose (CHRONULAC) 10 GM/15ML solution Take 30 mLs by mouth daily.    Marland Kitchen levothyroxine (SYNTHROID) 100 MCG tablet TAKE 1 TABLET DAILY (Patient taking differently: Take 100 mcg by mouth daily before breakfast. ) 90 tablet 2  . metoprolol tartrate (LOPRESSOR) 25 MG tablet Take 50 mg by mouth 2 (two) times daily.    . pantoprazole (PROTONIX) 40 MG tablet TAKE 1 TABLET TWICE A DAY (Patient taking differently: Take 40 mg by mouth daily. ) 180 tablet 3  . torsemide (DEMADEX) 10 MG tablet Take 40 mg by mouth 2  (two) times daily.     Marland Kitchen ULORIC 80 MG TABS Take 80 mg by mouth daily.     Marland Kitchen amLODipine (NORVASC) 5 MG tablet Take 1 tablet (5 mg total) by mouth daily. (Patient not taking: Reported on 12/27/2019) 90 tablet 4  . BD INSULIN SYRINGE U/F 31G X 5/16" 1 ML MISC     . cephALEXin (KEFLEX) 500 MG capsule Take 500 mg by mouth daily. (Patient not taking: Reported on 12/27/2019)    . gentamicin cream (GARAMYCIN) 0.1 % Apply 1 application topically daily.    . Ipratropium-Albuterol (COMBIVENT RESPIMAT) 20-100 MCG/ACT AERS respimat Inhale 2 puffs into the lungs every 4 (four) hours as needed for wheezing. (Patient not taking: Reported on 12/21/2019)    . Spacer/Aero-Holding Chambers (AEROCHAMBER PLUS WITH MASK) inhaler See admin instructions.    Marland Kitchen Spacer/Aero-Holding Chambers (EASIVENT) inhaler See admin instructions.       Discharge Medications: Please see discharge summary for a list of discharge medications.  Relevant Imaging Results:  Relevant Lab Results:   Additional Information SS#[id number]  Adelene Amas, LCSWA

## 2019-12-27 NOTE — Progress Notes (Signed)
Pharmacy Antibiotic Note  Patrick Hurst is a 64 y.o. male admitted on 12/27/2019 with sepsis.  Pharmacy has been consulted for vanc/cefepime dosing. Patient has a h/o of ESRD on peritoneal dialysis w/ a session incomplete last night prior to coming into the ED.  Plan: Patient received vanc 2.25g IV load and cefepime 2g IV x 1 in the ED. Since patient is ESRD on peritoneal dialysis will continue cefepime 1g IV q24h and will check a random vanc level 72 hours post load and continue to monitor clinical course and adjust doses as necessary.  Goal random < 20 - 25 mcg/mL.  Height: 5\' 11"  (180.3 cm) Weight: 135.6 kg (298 lb 15.1 oz) IBW/kg (Calculated) : 75.3  No data recorded.  Recent Labs  Lab 12/27/19 0054 12/27/19 0307 12/27/19 0426  WBC 9.4  --  10.7*  CREATININE 17.58*  --   --   LATICACIDVEN 0.8 0.6  --     Estimated Creatinine Clearance: 6.1 mL/min (A) (by C-G formula based on SCr of 17.58 mg/dL (H)).    Allergies  Allergen Reactions  . Mucinex [Guaifenesin Er] Swelling    Throat swelling, increases heart rate  . Levaquin [Levofloxacin] Palpitations    Thank you for allowing pharmacy to be a part of this patient's care.  Tobie Lords, PharmD, BCPS Clinical Pharmacist 12/27/2019 5:24 AM

## 2019-12-27 NOTE — Progress Notes (Signed)
Hassan Rowan, NP notified about pt current H&H post blood transfusion. Awaiting call back from provider.

## 2019-12-27 NOTE — ED Notes (Signed)
Blood consent signed, blood to lab.

## 2019-12-27 NOTE — TOC Initial Note (Signed)
Transition of Care [facility]) - Initial/Assessment Note    Patient Details  Name: Patrick Hurst MRN: [id number] Date of Birth: [birth year]  [facility] CM/SW Contact:    Patrick Hurst Phone Number: [phone] 12/27/2019, 4:34 PM  Clinical Narrative:                  Patient presents to [facility] ED due to shortness of breath. CSW spoke with patient's wife Patrick Hurst [phone], patient is continent and is able to complete ADLs "but he does them slowly due to difficulty breathing."  Ms. Riecke states if there is a recommendation for SNF, she would need to discuss it with her children and the patient.  This CSW stated there was still not a definitive recommendation for disposition and that I would contact her as soon as I knew.  Ms. Tat verbalized understanding. Patient currently uses 2L of Oxygen throughout day and night. FL2 complete, PASRR#[id number]  Expected Discharge Plan: [place] Barriers to Discharge: Continued Medical Work up   Patient Goals and CMS Choice Patient states their goals for this hospitalization and ongoing recovery are:: Woudl liek to return home.      Expected Discharge Plan and Services Expected Discharge Plan: [place] In-house Referral: Clinical Social Work     Living arrangements for the past 2 months: [place]                                      Prior Living Arrangements/Services Living arrangements for the past 2 months: [place] with:: Spouse Patient language and need for interpreter reviewed:: Yes Do you feel safe going back to the place where you live?: Yes      Need for Family Participation in Patient Care: Yes (Comment) Care giver support system in place?: Yes (comment)   Criminal Activity/Legal Involvement Pertinent to Current Situation/Hospitalization: No - Comment as needed  Activities of Daily Living      Permission Sought/Granted Permission  sought to share information with : Family Supports Patrick, Hurst (Spouse) [phone])    Share Information with NAME: Patrick, Hurst (Spouse) [phone]     Permission granted to share info w Relationship: Patrick, Hurst (Spouse) [phone]     Emotional Assessment Appearance:: Appears older than stated age Attitude/Demeanor/Rapport: Gracious Affect (typically observed): Anxious Orientation: : Oriented to Self, Oriented to Place, Oriented to Situation Alcohol / Substance Use: Not Applicable Psych Involvement: No (comment)  Admission diagnosis:  Altered mental state [R41.82] Patient Active Problem List   Diagnosis Date Noted  . Altered mental state 12/27/2019  . Pneumonia due to infectious organism 11/15/2019  . Dependence on nocturnal oxygen therapy   . Vitamin D deficiency   . ESRD on dialysis ([place]) 06/01/2019  . History of atrial fibrillation   . Respiratory failure with hypoxia ([place]) 03/01/2019  . COPD (chronic obstructive pulmonary disease) ([place]) 12/08/2018  . Diabetic retinopathy of both eyes without macular edema associated with type 2 diabetes mellitus ([place]) 10/20/2018  . Secondary hyperparathyroidism of renal origin ([place]) 06/20/2018  . Chronic diastolic CHF (congestive heart failure) ([place]) 02/27/2018  . Psoriasis (a type of skin inflammation) 10/19/2016  . Primary osteoarthritis of both knees 10/01/2015  . Morbid (severe) obesity due to excess calories ([place]) 10/01/2015  . Splenomegaly 09/19/2015  . ANA positive 07/21/2015  . Hepatic fibrosis 04/16/2015  . Charcot ankle 04/02/2015  . Thrombocytopenia ([place]) 02/14/2015  .  Allergic rhinitis 02/12/2015  . Clinical depression 02/12/2015  . Diabetes mellitus with neuropathy ([place]) 02/12/2015  . Erectile dysfunction 02/12/2015  . HLD (hyperlipidemia) 02/12/2015  . Hypertension 02/12/2015  . Hypothyroidism 02/12/2015  . Anemia, iron deficiency 02/12/2015  . Low testosterone 02/12/2015  . Peripheral neuropathy 02/12/2015   . Shortness of breath 02/12/2015  . Obstructive sleep apnea 02/12/2015  . Gout 05/05/2012   PCP:  [name], MD Pharmacy:   [facility] [address]), [place] [address] [place]) [place] [address] Phone: [phone] Fax: [phone]  [facility] [place], [place] [address] Phone: [phone] Fax: [phone]     Social Determinants of Health (SDOH) Interventions    Readmission Risk Interventions Readmission Risk Prevention Plan 11/19/2019  Transportation Screening Complete  PCP or Specialist Appt within 3-5 Days Complete  HRI or [place] Complete  Social Work Consult for [place] Planning/Counseling Complete  Palliative Care Screening Not Applicable  Medication Review Press photographer) Complete  Some recent data might be hidden

## 2019-12-27 NOTE — ED Triage Notes (Signed)
Pt arrives from home via [facility] with wife reporting she "couldn't wake him up for his nightly inhaler." Pt with snoring respirations per EMS.  EMS reports cbg 53, repeat 157 after 20 mL d10. EMS gave 3mg  narcan with mild improvement. Pt spo2 80's% RA, 96% NRB  Pt drowsy with snoring respirations on arrival, rousable and responsive to voice. A&O x4.   History of COPD with wife reporting pt has been sick "past couple days"

## 2019-12-27 NOTE — ED Notes (Signed)
Pt repeatedly removes nasal cannula and NRB, switched to just nasal cannula

## 2019-12-27 NOTE — Progress Notes (Signed)
CODE SEPSIS - PHARMACY COMMUNICATION  **Broad Spectrum Antibiotics should be administered within 1 hour of Sepsis diagnosis**  Time Code Sepsis Called/Page Received: 0244  Antibiotics Ordered: vanc/cefepime/flagyl  Time of 1st antibiotic administration: 0317  Additional action taken by pharmacy:   If necessary, Name of Provider/Nurse Contacted:     Tobie Lords ,PharmD Clinical Pharmacist  12/27/2019  5:23 AM

## 2019-12-27 NOTE — H&P (Addendum)
[place]   PATIENT NAME: Patrick Hurst    MR#:  [id number]  DATE OF BIRTH:  11/03/1955  DATE OF ADMISSION:  12/27/2019  PRIMARY CARE PHYSICIAN: [name], MD   REQUESTING/REFERRING PHYSICIAN: Hinda Kehr, MD  CHIEF COMPLAINT:   Chief Complaint  Patient presents with  . Shortness of Breath    HISTORY OF PRESENT ILLNESS:  Patrick Hurst  is a 64 y.o. Caucasian male with a known history of end-stage renal disease on peritoneal dialysis, CHF, COPD, atrial fibrillation, depression, GERD and gout and obstructive sleep apnea on CPAP, who presented to the emergency room with acute onset of dyspnea with altered mental status and decreased responsiveness.  Patient had a blood glucose of 52 by EMS.  He was given 200 mL of IV D10W without significant improvement in his mental status.  He was then given 3 mg of IV Narcan and was more alert and conversant.  Better he came to the ER however a was somnolent again with sonorous respirations.  He was found to be hypoxemic by EMS as well with pulse currently in the mid to upper 80s for which she was placed on nonrebreather and pulse currently was up to 93%.  In the ER he received an ampule of D 50 as his blood glucose was again 61.  He has been fully vaccinated for COVID-19. He denied any significant cough or wheezing. No nausea or vomiting or abdominal pain. No chest pain no palpitations.  Upon presentation to the emergency room, blood pressure was 95/47 pulse oximetry of 94% on 100% nonrebreather and otherwise normal vital signs..  He was ordered BiPAP.  Labs revealed ABG with pH 7.13 and bicarbonate of 22.  CMP showed a BUN of 99 and creatinine of 17.5 with a low glucose of 61 anion gap of 22 and CO2 25.  Albumin was 3.2 total protein of 7.6.  BNP was 319.9 lactic acid 0.8 and later 0.64 procalcitonin of 1.35.  CBC showed significant anemia with hemoglobin of 5.7 hematocrit 17.9 compared to 8.5 and 24.3 about a month ago.  INR is 1.3 and PT  15.7.  Urine drug screen was positive for tricyclics.  Blood cultures were drawn.  Chest x-ray showed cardiomegaly with findings of CHF that is new compared to previous chest x-ray.  There was no focal consolidation.  The patient was empirically given IV vancomycin and cefepime, and amp of D50 lactated Ringer at 150 mL/h.  He will be admitted to the progressive unit bed for further evaluation and management.   PAST MEDICAL HISTORY:   Past Medical History:  Diagnosis Date  . Acute on chronic diastolic CHF (congestive heart failure) ([place]) 06/21/2018  . Acute respiratory failure with hypoxia ([place]) 06/21/2018  . Bell palsy 02/12/2015  . Carpal tunnel syndrome 02/12/2015  . Dependence on nocturnal oxygen therapy    2 LITERS WITH BIPAP  . Depression   . Gastric ulcer   . GERD (gastroesophageal reflux disease)   . Gout   . History of chicken pox   . Multifocal pneumonia 03/04/2019  . Sleep apnea treated with nocturnal BiPAP     PAST SURGICAL HISTORY:   Past Surgical History:  Procedure Laterality Date  . AV FISTULA PLACEMENT Left 11/08/2019   Procedure: ARTERIOVENOUS (AV) FISTULA CREATION (RADIALCEPHALIC);  Surgeon: Algernon Huxley, MD;  Location: [facility] ORS;  Service: Vascular;  Laterality: Left;  . CATARACT EXTRACTION Bilateral 2014 and 2015  . CHOLECYSTECTOMY  04/28/2011  Laproscopic; Dr. Pat Patrick  . DIALYSIS/PERMA CATHETER INSERTION N/A 05/24/2019   Procedure: DIALYSIS/PERMA CATHETER INSERTION;  Surgeon: Algernon Huxley, MD;  Location: [facility];  Service: Cardiovascular;  Laterality: N/A;  . DIALYSIS/PERMA CATHETER INSERTION N/A 10/09/2019   Procedure: DIALYSIS/PERMA CATHETER INSERTION;  Surgeon: Katha Cabal, MD;  Location: [facility];  Service: Cardiovascular;  Laterality: N/A;  . EYE SURGERY    . GALLBLADDER SURGERY    . LASIK Bilateral 2019   medical  . SHOULDER SURGERY Right 2012   Dr. Leanor Kail    SOCIAL HISTORY:   Social History   Tobacco Use  .  Smoking status: Former Smoker    Packs/day: 1.00    Years: 15.00    Pack years: 15.00    Types: Cigarettes    Quit date: 05/03/1978    Years since quitting: 41.6  . Smokeless tobacco: Never Used  . Tobacco comment: started smoking at age 36  Substance Use Topics  . Alcohol use: No    Alcohol/week: 0.0 standard drinks    FAMILY HISTORY:   Family History  Problem Relation Age of Onset  . COPD Mother   . Cancer Mother   . Heart disease Father     DRUG ALLERGIES:   Allergies  Allergen Reactions  . Mucinex [Guaifenesin Er] Swelling    Throat swelling, increases heart rate  . Levaquin [Levofloxacin] Palpitations    REVIEW OF SYSTEMS:   ROS As per history of present illness. All pertinent systems were reviewed above. Constitutional, HEENT, cardiovascular, respiratory, GI, GU, musculoskeletal, neuro, psychiatric, endocrine, integumentary and hematologic systems were reviewed and are otherwise negative/unremarkable except for positive findings mentioned above in the HPI.   MEDICATIONS AT HOME:   Prior to Admission medications   Medication Sig Start Date End Date Taking? Authorizing Provider  amiodarone (PACERONE) 200 MG tablet Take 200 mg by mouth daily. 08/16/19   [provider]  amitriptyline (ELAVIL) 25 MG tablet TAKE 1 TABLET AT BEDTIME Patient taking differently: Take 25 mg by mouth at bedtime.  07/26/19   [name], MD  amLODipine (NORVASC) 5 MG tablet Take 1 tablet (5 mg total) by mouth daily. 11/12/19   [name], MD  Ascorbic Acid (VITAMIN C) 1000 MG tablet Take 1,000 mg by mouth 2 (two) times daily.     [provider]  atorvastatin (LIPITOR) 40 MG tablet TAKE 1 TABLET DAILY 10/15/19   [name], MD  BD INSULIN SYRINGE U/F 31G X 5/16" 1 ML MISC  11/18/19   [provider]  Budeson-Glycopyrrol-Formoterol (BREZTRI AEROSPHERE) 160-9-4.8 MCG/ACT AERO Inhale 2 puffs into the lungs in the morning and at bedtime. Patient not  taking: Reported on 12/21/2019    [provider]  calcitRIOL (ROCALTROL) 0.5 MCG capsule Take 0.5 mcg by mouth daily. 12/29/18   [provider]  cephALEXin (KEFLEX) 500 MG capsule Take 500 mg by mouth daily. 12/18/19   [provider]  cetirizine (ZYRTEC) 10 MG tablet Take 10 mg by mouth daily.  Patient not taking: Reported on 12/21/2019    [provider]  ELIQUIS 2.5 MG TABS tablet Take 2.5 mg by mouth 2 (two) times daily.  03/23/19   [provider]  gentamicin cream (GARAMYCIN) 0.1 % Apply 1 application topically daily. 09/22/19   [provider]  insulin regular human CONCENTRATED (HUMULIN R) 500 UNIT/ML injection Inject up to 25 units twice a day, or as directed by physician Patient taking differently:  Inject 50 Units into the skin 2 (two) times daily with a meal. 60 UNITS IN THE AM & 50 UNITS AT NIGHT 01/05/19   [name], MD  Ipratropium-Albuterol (COMBIVENT RESPIMAT) 20-100 MCG/ACT AERS respimat Inhale 1 puff into the lungs every 6 (six) hours. Patient not taking: Reported on 12/21/2019    [provider]  Ipratropium-Albuterol (COMBIVENT RESPIMAT) 20-100 MCG/ACT AERS respimat Inhale 2 puffs into the lungs every 4 (four) hours as needed for wheezing. Patient not taking: Reported on 12/21/2019    [provider]  lactulose (CHRONULAC) 10 GM/15ML solution Take 30 mLs by mouth daily. 09/10/19   [provider]  levothyroxine (SYNTHROID) 100 MCG tablet TAKE 1 TABLET DAILY Patient taking differently: Take 100 mcg by mouth daily before breakfast.  08/01/19   [name], MD  metoprolol tartrate (LOPRESSOR) 50 MG tablet Take 0.5 tablets (25 mg total) by mouth 2 (two) times daily. 03/23/19   [name], MD  pantoprazole (PROTONIX) 40 MG tablet TAKE 1 TABLET TWICE A DAY Patient taking differently: Take 40 mg by mouth daily.  02/10/19   [name], MD  Spacer/Aero-Holding Chambers (AEROCHAMBER PLUS WITH  MASK) inhaler See admin instructions. 12/05/19   [provider]  Spacer/Aero-Holding Josiah Lobo (EASIVENT) inhaler See admin instructions. 12/05/19 12/04/20  [provider]  testosterone cypionate (DEPO-TESTOSTERONE) 200 MG/ML injection Inject 1 mL (200 mg total) into the muscle every 14 (fourteen) days. 03/23/19   [name], MD  torsemide (DEMADEX) 20 MG tablet Take 40 mg by mouth daily. 08/13/19   [provider]  ULORIC 80 MG TABS Take 80 mg by mouth daily.  05/18/17   [provider]      VITAL SIGNS:  Blood pressure 98/70, pulse 79, resp. rate 15, height 5\' 11"  (1.803 m), weight 135.6 kg, SpO2 95 %.  PHYSICAL EXAMINATION:  Physical Exam  GENERAL:  64 y.o.-year-old Caucasian male patient lying in the bed with mild respiratory distress with conversational dyspnea. EYES: Pupils equal, round, reactive to light and accommodation. No scleral icterus. Extraocular muscles intact.  HEENT: Head atraumatic, normocephalic. Oropharynx and nasopharynx clear.  NECK:  Supple, no jugular venous distention. No thyroid enlargement, no tenderness.  LUNGS: Slightly diminished bibasal breath sounds with bibasal rales. CARDIOVASCULAR: Regular rate and rhythm, S1, S2 normal. No murmurs, rubs, or gallops.  ABDOMEN: Soft, nondistended, nontender. Bowel sounds present. No organomegaly or mass.  EXTREMITIES: 1+ bilateral lower extremity pitting edema with no cyanosis, or clubbing.  NEUROLOGIC: Cranial nerves II through XII are intact. Muscle strength 5/5 in all extremities. Sensation intact. Gait not checked.  PSYCHIATRIC: The patient is alert and oriented x 3.  Normal affect and good eye contact. SKIN: No obvious rash, lesion, or ulcer.   LABORATORY PANEL:   CBC Recent Labs  Lab 12/27/19 0054  WBC 9.4  HGB 5.7*  HCT 17.9*  PLT 109*   ------------------------------------------------------------------------------------------------------------------  Chemistries   Recent Labs  Lab 12/27/19 0054  NA 140  K 4.5  CL 93*  CO2 25  GLUCOSE 61*  BUN 99*  CREATININE 17.58*  CALCIUM 6.7*  AST 15  ALT 18  ALKPHOS 114  BILITOT 0.9   ------------------------------------------------------------------------------------------------------------------  Cardiac Enzymes No results for input(s): TROPONINI in the last 168 hours. ------------------------------------------------------------------------------------------------------------------  RADIOLOGY:  DG Chest Portable 1 View  Result Date: 12/27/2019 CLINICAL DATA:  64 year old male with shortness of breath. EXAM: PORTABLE CHEST 1 VIEW COMPARISON:  Chest radiograph dated 11/29/2019. FINDINGS: Right-sided dialysis catheter in  similar position. There is cardiomegaly with vascular congestion and mild edema, new since the prior radiograph. No focal consolidation, pleural effusion, or pneumothorax. No acute osseous pathology. IMPRESSION: Cardiomegaly with findings of CHF, new since the prior radiograph. No focal consolidation. Electronically Signed   By: Anner Crete M.D.   On: 12/27/2019 01:17      IMPRESSION AND PLAN:   1.  Acute metabolic encephalopathy that is multifactorial.  This likely secondary to hypoglycemia as well as hypoxemia in addition to uremia with end-stage renal disease on peritoneal dialysis. -The patient will be admitted to a progressive unit bed. -Nephrology consultation will be obtained for possible need for hemodialysis. -I notified Dr. Holley Raring about the patient. -Hypoglycemia protocol will be followed. -Long-acting and high-dose insulin will be held off. -O2 protocol will be followed and BiPAP will be continued as needed especially with history of obstructive sleep apnea.  2.  Acute on chronic diastolic CHF with subsequent hypoxic and possibly hypercarbic acute respiratory failure contributing to acute encephalopathy and worsening by his symptomatic anemia. -We will continue  BiPAP and monitor ABG as needed. -The patient will be gently diuresed with IV Lasix pending dialysis. -He had a recent 2D echo on 11/16/2019 that revealed an EF of 60 to 65% with grade 2 diastolic dysfunction. -Obtain stool Hemoccult. -We will type and crossmatch and transfuse 1 unit of packed red blood cells via following serial hemoglobins and hematocrits.  3.  Type II diabetes mellitus. -The patient will be placed on supplement coverage with NovoLog and will hold off U5 100 for now.  4.  Hypertension. -Norvasc will be placed with holding parameters. -Elavil will be held off.  5.  Atrial fibrillation. -We will continue amiodarone and to hold his Eliquis pending stool Hemoccult given symptomatic anemia.  6.  DVT prophylaxis. -SCDs. -Medical prophylaxis currently held off pending stool Hemoccult given symptomatic anemia  All the records are reviewed and case discussed with ED provider. The plan of care was discussed in details with the patient (and family). I answered all questions. The patient agreed to proceed with the above mentioned plan. Further management will depend upon hospital course.   CODE STATUS: Full code  Status is: Inpatient  Remains inpatient appropriate because:Altered mental status, Ongoing diagnostic testing needed not appropriate for outpatient work up, Unsafe d/c plan, IV treatments appropriate due to intensity of illness or inability to take PO and Inpatient level of care appropriate due to severity of illness   Dispo: The patient is from: Home              Anticipated d/c is to: Home              Anticipated d/c date is: 2 days              Patient currently is not medically stable to d/c.   TOTAL TIME TAKING CARE OF THIS PATIENT: 60 minutes.    Christel Mormon M.D on 12/27/2019 at 3:56 AM  [facility]   From 7 PM-7 AM, contact night-coverage [email or web address]  CC: Primary care physician; [name], MD   Note: This dictation was prepared  with Dragon dictation along with smaller phrase technology. Any transcriptional typo errors that result from this process are unintentional.

## 2019-12-27 NOTE — ED Notes (Signed)
Report received from [facility]. Patient care assumed. Patient/RN introduction complete. Will continue to monitor. Pt resting comfortably with eyes closed, bipap in place pt tolerating it well. IVF and antibiotics infusing well, family at bedside. No distress noted a this time, pt pending admission.

## 2019-12-28 ENCOUNTER — Encounter: Payer: Self-pay | Admitting: Anesthesiology

## 2019-12-28 ENCOUNTER — Ambulatory Visit: Payer: Self-pay | Admitting: Family Medicine

## 2019-12-28 ENCOUNTER — Encounter: Payer: Self-pay | Admitting: Family Medicine

## 2019-12-28 ENCOUNTER — Inpatient Hospital Stay: Payer: Commercial Managed Care - PPO

## 2019-12-28 DIAGNOSIS — R4182 Altered mental status, unspecified: Secondary | ICD-10-CM

## 2019-12-28 LAB — BASIC METABOLIC PANEL
Anion gap: 17 — ABNORMAL HIGH (ref 5–15)
BUN: 57 mg/dL — ABNORMAL HIGH (ref 8–23)
CO2: 26 mmol/L (ref 22–32)
Calcium: 6.7 mg/dL — ABNORMAL LOW (ref 8.9–10.3)
Chloride: 92 mmol/L — ABNORMAL LOW (ref 98–111)
Creatinine, Ser: 10.52 mg/dL — ABNORMAL HIGH (ref 0.61–1.24)
GFR calc Af Amer: 5 mL/min — ABNORMAL LOW (ref >60)
GFR calc non Af Amer: 5 mL/min — ABNORMAL LOW (ref >60)
Glucose, Bld: 161 mg/dL — ABNORMAL HIGH (ref 70–99)
Potassium: 5 mmol/L (ref 3.5–5.1)
Sodium: 135 mmol/L (ref 135–145)

## 2019-12-28 LAB — PREPARE RBC (CROSSMATCH)

## 2019-12-28 LAB — CBC
HCT: 19.1 % — ABNORMAL LOW (ref 39.0–52.0)
Hemoglobin: 6.6 g/dL — ABNORMAL LOW (ref 13.0–17.0)
MCH: 31.1 pg (ref 26.0–34.0)
MCHC: 34.6 g/dL (ref 30.0–36.0)
MCV: 90.1 fL (ref 80.0–100.0)
Platelets: 117 10*3/uL — ABNORMAL LOW (ref 150–400)
RBC: 2.12 MIL/uL — ABNORMAL LOW (ref 4.22–5.81)
RDW: 16.8 % — ABNORMAL HIGH (ref 11.5–15.5)
WBC: 7.4 10*3/uL (ref 4.0–10.5)
nRBC: 1.2 % — ABNORMAL HIGH (ref 0.0–0.2)

## 2019-12-28 LAB — GLUCOSE, CAPILLARY
Glucose-Capillary: 151 mg/dL — ABNORMAL HIGH (ref 70–99)
Glucose-Capillary: 137 mg/dL — ABNORMAL HIGH (ref 70–99)
Glucose-Capillary: 127 mg/dL — ABNORMAL HIGH (ref 70–99)

## 2019-12-28 LAB — PHOSPHORUS: Phosphorus: 7.8 mg/dL — ABNORMAL HIGH (ref 2.5–4.6)

## 2019-12-28 LAB — PARATHYROID HORMONE, INTACT (NO CA): PTH: 222 pg/mL — ABNORMAL HIGH (ref 15–65)

## 2019-12-28 LAB — HEMOGLOBIN AND HEMATOCRIT, BLOOD
HCT: 20.7 % — ABNORMAL LOW (ref 39.0–52.0)
Hemoglobin: 6.9 g/dL — ABNORMAL LOW (ref 13.0–17.0)

## 2019-12-28 LAB — MAGNESIUM: Magnesium: 1.7 mg/dL (ref 1.7–2.4)

## 2019-12-28 MED ORDER — SODIUM CHLORIDE 0.9 % IV SOLN: INTRAVENOUS | Status: DC

## 2019-12-28 MED ORDER — PROPOFOL 500 MG/50ML IV EMUL
INTRAVENOUS | Status: AC
  Filled 2019-12-28: qty 50

## 2019-12-28 MED ORDER — SODIUM CHLORIDE 0.9% IV SOLUTION: Freq: Once | INTRAVENOUS | Status: DC

## 2019-12-28 MED ORDER — LORAZEPAM 2 MG/ML IJ SOLN
1.0000 mg | Freq: Once | INTRAMUSCULAR | Status: AC
  Administered 2019-12-28: 1 mg via INTRAVENOUS
  Filled 2019-12-28: qty 1

## 2019-12-28 MED ORDER — LIDOCAINE HCL (PF) 2 % IJ SOLN
INTRAMUSCULAR | Status: AC
  Filled 2019-12-28: qty 5

## 2019-12-28 NOTE — Progress Notes (Signed)
Pharmacy Antibiotic Note  Patrick Hurst is a 64 y.o. male admitted on 12/27/2019 with sepsis.  Pharmacy has been consulted for vanc/cefepime dosing. Patient has a h/o of ESRD on peritoneal dialysis w/ a session incomplete last night prior to coming into the ED.  Plan: MRSA PCR NEGATIVE - will dc vancomycin  Will continue Cefepime 1g q24h  Height: 5\' 11"  (180.3 cm) Weight: (!) 144.6 kg (318 lb 11.2 oz) IBW/kg (Calculated) : 75.3  Temp (24hrs), Avg:98.9 F (37.2 C), Min:98.4 F (36.9 C), Max:99.5 F (37.5 C)  Recent Labs  Lab 12/27/19 0054 12/27/19 0307 12/27/19 0426 12/28/19 0953  WBC 9.4  --  10.7* 7.4  CREATININE 17.58*  --   --  10.52*  LATICACIDVEN 0.8 0.6  --   --     Estimated Creatinine Clearance: 10.6 mL/min (A) (by C-G formula based on SCr of 10.52 mg/dL (H)).    Allergies  Allergen Reactions  . Mucinex [Guaifenesin Er] Swelling    Throat swelling, increases heart rate  . Levaquin [Levofloxacin] Palpitations    Cefepime 8/26 >> Vancomycin 8/26 >> 8/26  Thank you for allowing pharmacy to be a part of this patient's care.  Lu Duffel, PharmD, BCPS Clinical Pharmacist 12/28/2019 12:07 PM

## 2019-12-28 NOTE — OR Nursing (Signed)
  PT   ARRIVED IN ENDO UNIT ON 6 LITERS OF O2 VIA N/C. SAO2 78-80PT PT C/O SOB. AND CLAWING AT N/C . NASAL CANNULA REPLACED WITH FACE MASK AT TEN LITERS.DR'S TAHILINIA AND DR Bertell Maria IN . PROCEDURE CANCELLED AND PT RETURNED TO FLOOR DR   Terrence Dupont CALLED PT INPATIENT  DOCTOR FOR FURTHER CARE OF PATIENT. PT'S O2 ICREASED TO 87/88 ON 10 LITERS. SKIN WARM AND DRY, LESS C/O SOB. REPORT TO SHELBY GONZA,RN.

## 2019-12-28 NOTE — Progress Notes (Signed)
Patient arrived to endoscopy on 6L facemask O2, with SpO2 in the low-mid 80s. Patient in some respiratory distress. Will defer procedure, likely needs dialysis first for fluid overload.

## 2019-12-28 NOTE — Progress Notes (Signed)
Pt returned to the floor on 10L via NRB with O2 sat of 97% and BP 120/53. Pt currently having one unit of blood transfused. Pt still complaining of being winded and having a full abdomen. MD notified of patient condition, per MD continue with blood transfusion. Pt weaned off NRB back to [place] at 6L maintaining O2 sat of 86. RT notified and placed pt on 10L HFNC and will wean down as tolerated.

## 2019-12-28 NOTE — Progress Notes (Signed)
Patient came down for his endoscopy today but was found to be dyspneic.  Pulse ox was 84 on 6 L oxygen.  Therefore, we elected not to proceed with his endoscopy.  Patient has been sent back to the floor, hospitalist Dr. Dwyane Dee and nephrologist, Dr. Holley Raring have been notified and will be taking him for dialysis for volume optimization.  Anesthesia evaluated the patient prior to endoscopy as well and do not recommend proceeding in this clinical condition.  We will potentially place him on the schedule for tomorrow, however he will need to be reevaluated by on-call GI tomorrow, Dr. Marius Ditch and if stable, EGD can be done tomorrow.  N.p.o. past midnight

## 2019-12-28 NOTE — Progress Notes (Signed)
Vonda Antigua, MD [address], [place], [place], [place], [address] [address], [place], [place], [place], [address] Phone: [phone]  Fax: [phone]   Subjective: Patient resting in bed.  Family at bedside.  Patient denies dark brown bowel movement today.  No melena.  No nausea or vomiting.  No abdominal pain.   Objective: Exam: Vital signs in last 24 hours: Vitals:   12/28/19 0435 12/28/19 0444 12/28/19 0900 12/28/19 1210  BP: (!) 86/47 (!) 108/44 124/85 125/66  Pulse: 72 69 71 74  Resp: 20 19 16 16   Temp: 98.4 F (36.9 C)  98.5 F (36.9 C) 98.2 F (36.8 C)  TempSrc: Oral  Oral Oral  SpO2: (!) 89% 91% 93% 94%  Weight:      Height:       Weight change: 8.961 kg  Intake/Output Summary (Last 24 hours) at 12/28/2019 1238 Last data filed at 12/28/2019 0700 Gross per 24 hour  Intake 2633.14 ml  Output --  Net 2633.14 ml    General: No acute distress, AAO x3 Abd: Soft, NT/ND, No HSM Skin: Warm, no rashes Neck: Supple, Trachea midline   Lab Results: Lab Results  Component Value Date   WBC 7.4 12/28/2019   HGB 6.6 (L) 12/28/2019   HCT 19.1 (L) 12/28/2019   MCV 90.1 12/28/2019   PLT 117 (L) 12/28/2019   Micro Results: Recent Results (from the past 240 hour(s))  SARS Coronavirus 2 by RT PCR (hospital order, performed in [facility] hospital lab) Nasopharyngeal Nasopharyngeal Swab     Status: None   Collection Time: 12/27/19 12:54 AM   Specimen: Nasopharyngeal Swab  Result Value Ref Range Status   SARS Coronavirus 2 NEGATIVE NEGATIVE Final    Comment: (NOTE) SARS-CoV-2 target nucleic acids are NOT DETECTED.  The SARS-CoV-2 RNA is generally detectable in upper and lower respiratory specimens during the acute phase of infection. The lowest concentration of SARS-CoV-2 viral copies this assay can detect is 250 copies / mL. A negative result does not preclude SARS-CoV-2 infection and should not be used as the sole basis for treatment or  other patient management decisions.  A negative result may occur with improper specimen collection / handling, submission of specimen other than nasopharyngeal swab, presence of viral mutation(s) within the areas targeted by this assay, and inadequate number of viral copies (<250 copies / mL). A negative result must be combined with clinical observations, patient history, and epidemiological information.  Fact Sheet for Patients:   StrictlyIdeas.no  Fact Sheet for Healthcare Providers: BankingDealers.co.za  This test is not yet approved or  cleared by the [place] FDA and has been authorized for detection and/or diagnosis of SARS-CoV-2 by FDA under an Emergency Use Authorization (EUA).  This EUA will remain in effect (meaning this test can be used) for the duration of the COVID-19 declaration under Section 564(b)(1) of the Act, 21 U.S.C. section 360bbb-3(b)(1), unless the authorization is terminated or revoked sooner.  Performed at [facility], [address] [place], [place] [address]   Blood culture (routine x 2)     Status: None (Preliminary result)   Collection Time: 12/27/19  3:07 AM   Specimen: BLOOD  Result Value Ref Range Status   Specimen Description BLOOD RIGHT HAND  Final   Special Requests   Final    BOTTLES DRAWN AEROBIC AND ANAEROBIC Blood Culture adequate volume   Culture   Final    NO GROWTH 1 DAY Performed at [facility], [address] [place], [place] [address]    Report Status PENDING  Incomplete  Blood culture (routine x 2)     Status: None (Preliminary result)   Collection Time: 12/27/19  3:07 AM   Specimen: BLOOD  Result Value Ref Range Status   Specimen Description BLOOD RIGHT [facility]  Final   Special Requests   Final    BOTTLES DRAWN AEROBIC AND ANAEROBIC Blood Culture adequate volume   Culture   Final    NO GROWTH 1 DAY Performed at [facility], [address], [place], [place] [address]    Report Status PENDING  Incomplete  MRSA PCR Screening     Status: None   Collection Time: 12/27/19  3:45 PM   Specimen: Nasopharyngeal  Result Value Ref Range Status   MRSA by PCR NEGATIVE NEGATIVE Final    Comment:        The GeneXpert MRSA Assay (FDA approved for NASAL specimens only), is one component of a comprehensive MRSA colonization surveillance program. It is not intended to diagnose MRSA infection nor to guide or monitor treatment for MRSA infections. Performed at [facility], [place], [place] [address]    Studies/Results: DG Chest Portable 1 View  Result Date: 12/27/2019 CLINICAL DATA:  64 year old male with shortness of breath. EXAM: PORTABLE CHEST 1 VIEW COMPARISON:  Chest radiograph dated 11/29/2019. FINDINGS: Right-sided dialysis catheter in similar position. There is cardiomegaly with vascular congestion and mild edema, new since the prior radiograph. No focal consolidation, pleural effusion, or pneumothorax. No acute osseous pathology. IMPRESSION: Cardiomegaly with findings of CHF, new since the prior radiograph. No focal consolidation. Electronically Signed   By: Anner Crete M.D.   On: 12/27/2019 01:17   Medications:  Scheduled Meds: . sodium chloride   Intravenous Once  . amiodarone  200 mg Oral Daily  . amLODipine  5 mg Oral Daily  . vitamin C  1,000 mg Oral BID  . atorvastatin  40 mg Oral Daily  . calcitRIOL  0.5 mcg Oral Daily  . Chlorhexidine Gluconate Cloth  6 each Topical Q0600  . febuxostat  80 mg Oral Daily  . furosemide  40 mg Intravenous Q12H  . insulin aspart  0-9 Units Subcutaneous TID PC & HS  . lactulose  20 g Oral Daily  . levothyroxine  100 mcg Oral QAC breakfast  . metoprolol tartrate  25 mg Oral BID  . pantoprazole (PROTONIX) IV  40 mg Intravenous Q12H  . sodium chloride flush  3 mL Intravenous Q12H   Continuous Infusions: . sodium chloride    . sodium chloride    . sodium  chloride    . sodium chloride 20 mL/hr at 12/27/19 2309  . ceFEPime (MAXIPIME) IV 1 g (12/28/19 0455)  . sodium chloride     PRN Meds:.sodium chloride, sodium chloride, sodium chloride, acetaminophen, alteplase, alum & mag hydroxide-simeth, heparin, ipratropium-albuterol, lidocaine (PF), lidocaine-prilocaine, pentafluoroprop-tetrafluoroeth, sodium chloride flush   Assessment: Anemia  Plan: Hemoglobin 6.6 this morning and repeat RBC transfusion ordered by primary team  Plan on upper endoscopy today once PRBC transfusion has been started  Hemodialysis to be done after EGD today, to allow for procedure to be done in a timely fashion.  Nephrologist okay with this  I have discussed alternative options, risks & benefits,  which include, but are not limited to, bleeding, infection, perforation,respiratory complication & drug reaction.  The patient agrees with this plan & written consent will be obtained.  LOS: 1 day   Vonda Antigua, MD 12/28/2019, 12:38 PM

## 2019-12-28 NOTE — Progress Notes (Addendum)
Pt blood transfusion for 1 unit of PRBC completed.   Pt BP low 108/44 Patrick Rowan, NP notified. Provider stated to give pt lasix.

## 2019-12-28 NOTE — Progress Notes (Signed)
[facility]  ROUNDING NOTE   Subjective:  Patient appears to be in better spirits today. Hemoglobin up to 6.6 posttransfusion. Patient due for EGD today as well.  In addition patient will undergo another dialysis session.   Objective:  Vital signs in last 24 hours:  Temp:  [98.2 F (36.8 C)-99.5 F (37.5 C)] (P) 98.2 F (36.8 C) (08/27 1210) Pulse Rate:  [66-111] (P) 74 (08/27 1210) Resp:  [12-22] (P) 16 (08/27 1210) BP: (86-146)/(44-85) (P) 125/66 (08/27 1210) SpO2:  [89 %-100 %] (P) 94 % (08/27 1210) Weight:  [144.6 kg] 144.6 kg (08/26 2111)  Weight change: 8.961 kg Filed Weights   12/27/19 0051 12/27/19 2111  Weight: 135.6 kg (!) 144.6 kg    Intake/Output: I/O last 3 completed shifts: In: 2883.1 [I.V.:1121.1; Blood:1412; IV Piggyback:350] Out: -    Intake/Output this shift:  No intake/output data recorded.  Physical Exam: General:  No acute distress  Head:  Normocephalic, atraumatic. Moist oral mucosal membranes  Eyes:  Anicteric  Neck:  Supple  Lungs:   Basilar rales, normal effort  Heart:  S1S2 no rubs  Abdomen:   Soft, distended, bowel sounds present  Extremities:  3+ peripheral edema.  Neurologic:  Awake, alert, following commands  Skin:  No lesions  Access:  Right IJ PermCath, PD catheter    Basic Metabolic Panel: Recent Labs  Lab 12/27/19 0054 12/27/19 0625 12/28/19 0953  NA 140  --  135  K 4.5  --  5.0  CL 93*  --  92*  CO2 25  --  26  GLUCOSE 61*  --  161*  BUN 99*  --  57*  CREATININE 17.58*  --  10.52*  CALCIUM 6.7*  --  6.7*  MG  --   --  1.7  PHOS  --  15.1* 7.8*    Liver Function Tests: Recent Labs  Lab 12/27/19 0054  AST 15  ALT 18  ALKPHOS 114  BILITOT 0.9  PROT 7.6  ALBUMIN 3.2*   Recent Labs  Lab 12/27/19 0054  LIPASE 29   No results for input(s): AMMONIA in the last 168 hours.  CBC: Recent Labs  Lab 12/27/19 0054 12/27/19 0426 12/27/19 1545 12/27/19 2207 12/28/19 0953  WBC 9.4 10.7*  --    --  7.4  NEUTROABS 7.1  --   --   --   --   HGB 5.7* 5.4* 5.3* 5.5* 6.6*  HCT 17.9* 17.0* 15.3* 16.7* 19.1*  MCV 96.2 96.0  --   --  90.1  PLT 109* 111*  --   --  117*    Cardiac Enzymes: No results for input(s): CKTOTAL, CKMB, CKMBINDEX, TROPONINI in the last 168 hours.  BNP: Invalid input(s): POCBNP  CBG: Recent Labs  Lab 12/27/19 0255 12/27/19 0533 12/27/19 0831 12/27/19 2017 12/28/19 0908  GLUCAP 84 133* 171* 143* 151*    Microbiology: Results for orders placed or performed during the hospital encounter of 12/27/19  SARS Coronavirus 2 by RT PCR (hospital order, performed in [facility] hospital lab) Nasopharyngeal Nasopharyngeal Swab     Status: None   Collection Time: 12/27/19 12:54 AM   Specimen: Nasopharyngeal Swab  Result Value Ref Range Status   SARS Coronavirus 2 NEGATIVE NEGATIVE Final    Comment: (NOTE) SARS-CoV-2 target nucleic acids are NOT DETECTED.  The SARS-CoV-2 RNA is generally detectable in upper and lower respiratory specimens during the acute phase of infection. The lowest concentration of SARS-CoV-2 viral copies this assay can  detect is 250 copies / mL. A negative result does not preclude SARS-CoV-2 infection and should not be used as the sole basis for treatment or other patient management decisions.  A negative result may occur with improper specimen collection / handling, submission of specimen other than nasopharyngeal swab, presence of viral mutation(s) within the areas targeted by this assay, and inadequate number of viral copies (<250 copies / mL). A negative result must be combined with clinical observations, patient history, and epidemiological information.  Fact Sheet for Patients:   StrictlyIdeas.no  Fact Sheet for Healthcare Providers: BankingDealers.co.za  This test is not yet approved or  cleared by the [place] FDA and has been authorized for detection and/or diagnosis of  SARS-CoV-2 by FDA under an Emergency Use Authorization (EUA).  This EUA will remain in effect (meaning this test can be used) for the duration of the COVID-19 declaration under Section 564(b)(1) of the Act, 21 U.S.C. section 360bbb-3(b)(1), unless the authorization is terminated or revoked sooner.  Performed at [facility], [address] [place], [place] [address]   Blood culture (routine x 2)     Status: None (Preliminary result)   Collection Time: 12/27/19  3:07 AM   Specimen: BLOOD  Result Value Ref Range Status   Specimen Description BLOOD RIGHT HAND  Final   Special Requests   Final    BOTTLES DRAWN AEROBIC AND ANAEROBIC Blood Culture adequate volume   Culture   Final    NO GROWTH 1 DAY Performed at [facility], [address] [place], [place] [address]    Report Status PENDING  Incomplete  Blood culture (routine x 2)     Status: None (Preliminary result)   Collection Time: 12/27/19  3:07 AM   Specimen: BLOOD  Result Value Ref Range Status   Specimen Description BLOOD RIGHT [facility]  Final   Special Requests   Final    BOTTLES DRAWN AEROBIC AND ANAEROBIC Blood Culture adequate volume   Culture   Final    NO GROWTH 1 DAY Performed at [facility], [address], [place], [place] [address]    Report Status PENDING  Incomplete  MRSA PCR Screening     Status: None   Collection Time: 12/27/19  3:45 PM   Specimen: Nasopharyngeal  Result Value Ref Range Status   MRSA by PCR NEGATIVE NEGATIVE Final    Comment:        The GeneXpert MRSA Assay (FDA approved for NASAL specimens only), is one component of a comprehensive MRSA colonization surveillance program. It is not intended to diagnose MRSA infection nor to guide or monitor treatment for MRSA infections. Performed at [facility], [address] [place], [place] [address]     Coagulation Studies: Recent Labs    12/27/19 0054  LABPROT 15.7*  INR 1.3*    Urinalysis: No  results for input(s): COLORURINE, LABSPEC, PHURINE, GLUCOSEU, HGBUR, BILIRUBINUR, KETONESUR, PROTEINUR, UROBILINOGEN, NITRITE, LEUKOCYTESUR in the last 72 hours.  Invalid input(s): APPERANCEUR    Imaging: DG Chest Portable 1 View  Result Date: 12/27/2019 CLINICAL DATA:  64 year old male with shortness of breath. EXAM: PORTABLE CHEST 1 VIEW COMPARISON:  Chest radiograph dated 11/29/2019. FINDINGS: Right-sided dialysis catheter in similar position. There is cardiomegaly with vascular congestion and mild edema, new since the prior radiograph. No focal consolidation, pleural effusion, or pneumothorax. No acute osseous pathology. IMPRESSION: Cardiomegaly with findings of CHF, new since the prior radiograph. No focal consolidation. Electronically Signed   By: Milas Hock  Radparvar M.D.   On: 12/27/2019 01:17     Medications:   . sodium chloride    . sodium chloride    . sodium chloride    . sodium chloride 20 mL/hr at 12/27/19 2309  . ceFEPime (MAXIPIME) IV 1 g (12/28/19 0455)  . sodium chloride     . sodium chloride   Intravenous Once  . amiodarone  200 mg Oral Daily  . amLODipine  5 mg Oral Daily  . vitamin C  1,000 mg Oral BID  . atorvastatin  40 mg Oral Daily  . calcitRIOL  0.5 mcg Oral Daily  . Chlorhexidine Gluconate Cloth  6 each Topical Q0600  . febuxostat  80 mg Oral Daily  . furosemide  40 mg Intravenous Q12H  . insulin aspart  0-9 Units Subcutaneous TID PC & HS  . lactulose  20 g Oral Daily  . levothyroxine  100 mcg Oral QAC breakfast  . metoprolol tartrate  25 mg Oral BID  . pantoprazole (PROTONIX) IV  40 mg Intravenous Q12H  . sodium chloride flush  3 mL Intravenous Q12H   sodium chloride, sodium chloride, sodium chloride, acetaminophen, alteplase, alum & mag hydroxide-simeth, heparin, ipratropium-albuterol, lidocaine (PF), lidocaine-prilocaine, pentafluoroprop-tetrafluoroeth, sodium chloride flush  Assessment/ Plan:  64 y.o. male with past medical history of ESRD on PD with  intermittent hemodialysis for volume overload, chronic diastolic heart failure, Bell's palsy, carpal tunnel syndrome, depression, GERD, gout, anemia of chronic kidney disease, secondary hyper parathyroidism, chronic respiratory failure who presents now with increasing shortness of breath, volume overload, acute respiratory failure, severe anemia.  [facility] with use of intermittent hemodialysis CCPD 9 hours 5 exchanges  1.  ESRD mainly on peritoneal dialysis with intermittent backup hemodialysis.  Patient admitted with volume overload in the setting of severe anemia.  He has been undergoing intermittent backup hemodialysis for volume overload as an outpatient as well.  We will plan for hemodialysis treatment again today.  2.  Anemia of chronic kidney disease with possible GI blood loss.  Hemoglobin up to 6.6 posttransfusion.  Continue to monitor CBC.  Patient undergoing EGD today.  3.  Secondary hyperparathyroidism.  Phosphorus quite high at 7.8.  Should come down with ongoing dialysis treatment.  4.  Acute respiratory failure.  Weaned off of BiPAP.  Continue ultrafiltration with dialysis to aid with underlying respiratory status..   LOS: 1 Garnell Begeman 8/27/202112:14 PM

## 2019-12-28 NOTE — Progress Notes (Signed)
PROGRESS NOTE    Patrick Hurst  SEL:[id number] DOB: 12/29/1955 DOA: 12/27/2019 PCP: [name], MD   Brief Narrative:  Patrick Hurst a64 y.o.Caucasian malewith a known history of end-stage renal disease on peritoneal dialysis, CHF, COPD, atrial fibrillation, depression, GERD and goutand obstructive sleep apnea on CPAP, who presented to the emergency room with acute onset of dyspnea with Altered mental status and decreased responsiveness. Patient had a blood glucose of 52 by EMS. He was given 200 mL of IV D10W without significant improvement in his mental status. He was then given 3 mg of IV Narcan and was more alert and conversant. He was better after he came to the ER however became somnolent again with sonorous respirations. He was found to be hypoxemic by EMS as well with pulse currently in the mid to upper 80s for which he was placed on nonrebreather and pulse currently was up to 93%. In the ER he received an ampule of D 50 as his blood glucose was again 61. He has been fully vaccinated for COVID-19.He denied any significant cough or wheezing. No nausea or vomiting or abdominal pain. No chest pain no palpitations.  Upon presentation to the emergency room, blood pressure was 95/47 pulse oximetry of 94% on 100% nonrebreather and otherwise normal vital signs.. He was ordered BiPAP. Labs revealed ABG with pH 7.13 and bicarbonate of 22. CMP showed a BUN of 99 and creatinine of 17.5 with a low glucose of 61 anion gap of 22 and CO2 25. Albumin was 3.2 total protein of 7.6. BNP was 319.9 lactic acid 0.8 and later 0.64 procalcitonin of 1.35. CBC showed significant anemia with hemoglobin of 5.7 hematocrit 17.9 compared to 8.5 and 24.3 about a month ago. INR is 1.3 and PT 15.7. Urine drug screen was positive for tricyclics. Blood cultures were drawn. Chest x-ray showed cardiomegaly with findings of CHF that is new compared to previous chest x-ray. There was no focal  consolidation.  Patient is admitted for acute encephalopathy which could be multifactorial,  Patient at home is on peritoneal dialysis presented with worsening renal functions,  Nephrology consulted patient underwent hemodialysis,  feels little better,  going to have another hemodialysis session today.  Hemoglobin remains low,  GI consulted recommended GI work-up.  Patient was scheduled for EGD today but  given partial medical optimization it was canceled.  Patient is going to get more blood transfusion to get hemoglobin to a level where it would be appropriate for endoscopy.  Acute encephalopathy has resolved.  Patient is more alert and awake.  Patient will continue BiPAP as needed and at night.  Assessment & Plan:   Active Problems:   Altered mental state   Acute encephalopathy that is multifactorial >>>> Improved   This likely is secondary to hypoglycemia as well as hypoxemia in addition to uremia with end-stage renal disease on peritoneal dialysis. -The patient will be admitted to a progressive unit bed. -Nephrology consulted,  Patient underwent hemodialysis yesterday, feels better -Hypoglycemia protocol will be followed. -Long-acting and high-dose insulin will be held off. -O2 protocol will be followed and BiPAP will be continued as needed especially with history of obstructive sleep apnea.  2. Acute on chronic diastolic CHFwith subsequent hypoxic and possibly hypercarbic acute respiratory failurecontributing to acute encephalopathy and worsening by his symptomatic anemia. - Continue BiPAP and monitor ABG as needed. -The patient will be gently diuresed with IV Lasix pending dialysis. -He had a recent 2D echo on 11/16/2019 that revealed an EF of  60 to 65% with grade 2 diastolic dysfunction. -Obtain stool Hemoccult. -Patient has received 3 PRBCs so far the hemoglobin is 6.6.  3.  Symptomatic anemia; Patient presented with a Hb of 5.7, he is going to get blood transfusion during  hemodialysis. GI consulted, Patient was a scheduled to have EGD today but given persistent anemia it was canceled.   Patient is going to get 1 more unit of blood transfusion.  GI will reassess tomorrow for EGD , we will keep patient n.p.o. midnight.   3. Type II diabetes mellitus. -The patient will be placed on supplement coverage with NovoLog and will hold off U5 100 for now.  4. Hypertension. -Norvasc will be placed with holding parameters. -Elavil will be held off.  5. Atrial fibrillation. -We will continue amiodarone and to hold his Eliquis pending stool Hemoccult given symptomatic anemia.  6. DVT prophylaxis. -SCDs. -Medical prophylaxis currently held off pending stool Hemoccult given symptomatic anemia   DVT prophylaxis: SCDs. Code Status: Full  Family Communication: Discussed with patient and wife at bedside. Disposition Plan:  Patient is from home Anticipated discharge home once medically clear. Patient is not medically clear due to anemia ongoing GI work-up.   Consultants:   Nephrology, gastroenterology,  Procedures: BiPAP, hemodialysis  Antimicrobials: Anti-infectives (From admission, onward)   Start     Dose/Rate Route Frequency Ordered Stop   12/28/19 0500  ceFEPIme (MAXIPIME) 1 g in sodium chloride 0.9 % 100 mL IVPB        1 g 200 mL/hr over 30 Minutes Intravenous Every 24 hours 12/27/19 0531     12/27/19 0529  vancomycin variable dose per unstable renal function (pharmacist dosing)  Status:  Discontinued         Does not apply See admin instructions 12/27/19 0529 12/28/19 1204   12/27/19 0300  vancomycin (VANCOREADY) IVPB 1250 mg/250 mL        1,250 mg 166.7 mL/hr over 90 Minutes Intravenous  Once 12/27/19 0256 12/27/19 0828   12/27/19 0245  vancomycin (VANCOCIN) IVPB 1000 mg/200 mL premix        1,000 mg 200 mL/hr over 60 Minutes Intravenous  Once 12/27/19 0241 12/27/19 0419   12/27/19 0245  ceFEPIme (MAXIPIME) 2 g in sodium chloride 0.9 % 100  mL IVPB        2 g 200 mL/hr over 30 Minutes Intravenous  Once 12/27/19 0241 12/27/19 0408      Subjective: Patient was seen and examined at bedside.  Overnight his hemoglobin remains low after getting 2 units of blood transfusion.   Patient feels slightly better after getting hemodialysis.  Patient is going to have EGD followed by hemodialysis.  Objective: Vitals:   12/28/19 1328 12/28/19 1406 12/28/19 1416 12/28/19 1523  BP: (!) 120/53   (!) 145/78  Pulse: 69   71  Resp: 18   20  Temp:    98.4 F (36.9 C)  TempSrc:    Oral  SpO2: 96% 94% 94% 96%  Weight:      Height:        Intake/Output Summary (Last 24 hours) at 12/28/2019 1607 Last data filed at 12/28/2019 1523 Gross per 24 hour  Intake 3490.01 ml  Output --  Net 3490.01 ml   Filed Weights   12/27/19 0051 12/27/19 2111  Weight: 135.6 kg (!) 144.6 kg    Examination:  General exam: Appears calm and comfortable  Respiratory system: Clear to auscultation. Respiratory effort normal. Cardiovascular system: S1 & S2 heard, RRR.  No JVD, murmurs, rubs, gallops or clicks. No pedal edema. Gastrointestinal system: Abdomen is nondistended, soft and nontender. No organomegaly or masses felt. Normal bowel sounds heard. Central nervous system: Alert and oriented. No focal neurological deficits. Extremities: Symmetric 5 x 5 power. Skin: No rashes, lesions or ulcers Psychiatry: Judgement and insight appear normal. Mood & affect appropriate.     Data Reviewed: I have personally reviewed following labs and imaging studies  CBC: Recent Labs  Lab 12/27/19 0054 12/27/19 0426 12/27/19 1545 12/27/19 2207 12/28/19 0953  WBC 9.4 10.7*  --   --  7.4  NEUTROABS 7.1  --   --   --   --   HGB 5.7* 5.4* 5.3* 5.5* 6.6*  HCT 17.9* 17.0* 15.3* 16.7* 19.1*  MCV 96.2 96.0  --   --  90.1  PLT 109* 111*  --   --  179*   Basic Metabolic Panel: Recent Labs  Lab 12/27/19 0054 12/27/19 0625 12/28/19 0953  NA 140  --  135  K 4.5  --   5.0  CL 93*  --  92*  CO2 25  --  26  GLUCOSE 61*  --  161*  BUN 99*  --  57*  CREATININE 17.58*  --  10.52*  CALCIUM 6.7*  --  6.7*  MG  --   --  1.7  PHOS  --  15.1* 7.8*   GFR: Estimated Creatinine Clearance: 10.6 mL/min (A) (by C-G formula based on SCr of 10.52 mg/dL (H)). Liver Function Tests: Recent Labs  Lab 12/27/19 0054  AST 15  ALT 18  ALKPHOS 114  BILITOT 0.9  PROT 7.6  ALBUMIN 3.2*   Recent Labs  Lab 12/27/19 0054  LIPASE 29   No results for input(s): AMMONIA in the last 168 hours. Coagulation Profile: Recent Labs  Lab 12/27/19 0054  INR 1.3*   Cardiac Enzymes: No results for input(s): CKTOTAL, CKMB, CKMBINDEX, TROPONINI in the last 168 hours. BNP (last 3 results) No results for input(s): PROBNP in the last 8760 hours. HbA1C: No results for input(s): HGBA1C in the last 72 hours. CBG: Recent Labs  Lab 12/27/19 0533 12/27/19 0831 12/27/19 2017 12/28/19 0908 12/28/19 1234  GLUCAP 133* 171* 143* 151* 137*   Lipid Profile: No results for input(s): CHOL, HDL, LDLCALC, TRIG, CHOLHDL, LDLDIRECT in the last 72 hours. Thyroid Function Tests: No results for input(s): TSH, T4TOTAL, FREET4, T3FREE, THYROIDAB in the last 72 hours. Anemia Panel: No results for input(s): VITAMINB12, FOLATE, FERRITIN, TIBC, IRON, RETICCTPCT in the last 72 hours. Sepsis Labs: Recent Labs  Lab 12/27/19 0054 12/27/19 0307  PROCALCITON 1.35  --   LATICACIDVEN 0.8 0.6    Recent Results (from the past 240 hour(s))  SARS Coronavirus 2 by RT PCR (hospital order, performed in [facility] hospital lab) Nasopharyngeal Nasopharyngeal Swab     Status: None   Collection Time: 12/27/19 12:54 AM   Specimen: Nasopharyngeal Swab  Result Value Ref Range Status   SARS Coronavirus 2 NEGATIVE NEGATIVE Final    Comment: (NOTE) SARS-CoV-2 target nucleic acids are NOT DETECTED.  The SARS-CoV-2 RNA is generally detectable in upper and lower respiratory specimens during the acute phase of  infection. The lowest concentration of SARS-CoV-2 viral copies this assay can detect is 250 copies / mL. A negative result does not preclude SARS-CoV-2 infection and should not be used as the sole basis for treatment or other patient management decisions.  A negative result may occur with improper specimen collection /  handling, submission of specimen other than nasopharyngeal swab, presence of viral mutation(s) within the areas targeted by this assay, and inadequate number of viral copies (<250 copies / mL). A negative result must be combined with clinical observations, patient history, and epidemiological information.  Fact Sheet for Patients:   StrictlyIdeas.no  Fact Sheet for Healthcare Providers: BankingDealers.co.za  This test is not yet approved or  cleared by the [place] FDA and has been authorized for detection and/or diagnosis of SARS-CoV-2 by FDA under an Emergency Use Authorization (EUA).  This EUA will remain in effect (meaning this test can be used) for the duration of the COVID-19 declaration under Section 564(b)(1) of the Act, 21 U.S.C. section 360bbb-3(b)(1), unless the authorization is terminated or revoked sooner.  Performed at [facility], [address] [place], [place] [address]   Blood culture (routine x 2)     Status: None (Preliminary result)   Collection Time: 12/27/19  3:07 AM   Specimen: BLOOD  Result Value Ref Range Status   Specimen Description BLOOD RIGHT HAND  Final   Special Requests   Final    BOTTLES DRAWN AEROBIC AND ANAEROBIC Blood Culture adequate volume   Culture   Final    NO GROWTH 1 DAY Performed at [facility], [address], [place], [place] [address]    Report Status PENDING  Incomplete  Blood culture (routine x 2)     Status: None (Preliminary result)   Collection Time: 12/27/19  3:07 AM   Specimen: BLOOD  Result Value Ref Range Status   Specimen  Description BLOOD RIGHT [facility]  Final   Special Requests   Final    BOTTLES DRAWN AEROBIC AND ANAEROBIC Blood Culture adequate volume   Culture   Final    NO GROWTH 1 DAY Performed at [facility], [address] [place], [place] [address]    Report Status PENDING  Incomplete  MRSA PCR Screening     Status: None   Collection Time: 12/27/19  3:45 PM   Specimen: Nasopharyngeal  Result Value Ref Range Status   MRSA by PCR NEGATIVE NEGATIVE Final    Comment:        The GeneXpert MRSA Assay (FDA approved for NASAL specimens only), is one component of a comprehensive MRSA colonization surveillance program. It is not intended to diagnose MRSA infection nor to guide or monitor treatment for MRSA infections. Performed at [facility], [address], [place], [place] [address]          Radiology Studies: US Venous Img Lower Bilateral (DVT)  Result Date: 12/28/2019 CLINICAL DATA:  Bilateral lower extremity pain and edema for the past months. Patient is currently on anticoagulation. Evaluate for DVT. EXAM: BILATERAL LOWER EXTREMITY VENOUS DOPPLER ULTRASOUND TECHNIQUE: Gray-scale sonography with graded compression, as well as color Doppler and duplex ultrasound were performed to evaluate the lower extremity deep venous systems from the level of the common femoral vein and including the common femoral, femoral, profunda femoral, popliteal and calf veins including the posterior tibial, peroneal and gastrocnemius veins when visible. The superficial great saphenous vein was also interrogated. Spectral Doppler was utilized to evaluate flow at rest and with distal augmentation maneuvers in the common femoral, femoral and popliteal veins. COMPARISON:  None FINDINGS: RIGHT LOWER EXTREMITY Common Femoral Vein: No evidence of thrombus. Normal compressibility, respiratory phasicity and response to augmentation. Saphenofemoral Junction: No evidence of thrombus. Normal compressibility and  flow on color Doppler imaging. Profunda Femoral Vein: No  evidence of thrombus. Normal compressibility and flow on color Doppler imaging. Femoral Vein: No evidence of thrombus. Normal compressibility, respiratory phasicity and response to augmentation. Popliteal Vein: No evidence of thrombus within either of the duplicated popliteal veins. Normal compressibility, respiratory phasicity and response to augmentation. Calf Veins: No evidence of thrombus. Normal compressibility and flow on color Doppler imaging. Superficial Great Saphenous Vein: No evidence of thrombus. Normal compressibility. Venous Reflux:  None. Other Findings:  None. LEFT LOWER EXTREMITY Common Femoral Vein: No evidence of thrombus. Normal compressibility, respiratory phasicity and response to augmentation. Saphenofemoral Junction: No evidence of thrombus. Normal compressibility and flow on color Doppler imaging. Profunda Femoral Vein: No evidence of thrombus. Normal compressibility and flow on color Doppler imaging. Femoral Vein: No evidence of thrombus. Normal compressibility, respiratory phasicity and response to augmentation. Popliteal Vein: No evidence of thrombus. Normal compressibility, respiratory phasicity and response to augmentation. Calf Veins: No evidence of thrombus. Normal compressibility and flow on color Doppler imaging. Superficial Great Saphenous Vein: No evidence of thrombus. Normal compressibility. Venous Reflux:  None. Other Findings:  None. IMPRESSION: No evidence of DVT within either lower extremity. Electronically Signed   By: Sandi Mariscal M.D.   On: 12/28/2019 12:49   DG Chest Portable 1 View  Result Date: 12/27/2019 CLINICAL DATA:  64 year old male with shortness of breath. EXAM: PORTABLE CHEST 1 VIEW COMPARISON:  Chest radiograph dated 11/29/2019. FINDINGS: Right-sided dialysis catheter in similar position. There is cardiomegaly with vascular congestion and mild edema, new since the prior radiograph. No focal  consolidation, pleural effusion, or pneumothorax. No acute osseous pathology. IMPRESSION: Cardiomegaly with findings of CHF, new since the prior radiograph. No focal consolidation. Electronically Signed   By: Anner Crete M.D.   On: 12/27/2019 01:17    Scheduled Meds: . sodium chloride   Intravenous Once  . amiodarone  200 mg Oral Daily  . amLODipine  5 mg Oral Daily  . vitamin C  1,000 mg Oral BID  . atorvastatin  40 mg Oral Daily  . calcitRIOL  0.5 mcg Oral Daily  . Chlorhexidine Gluconate Cloth  6 each Topical Q0600  . febuxostat  80 mg Oral Daily  . furosemide  40 mg Intravenous Q12H  . insulin aspart  0-9 Units Subcutaneous TID PC & HS  . lactulose  20 g Oral Daily  . levothyroxine  100 mcg Oral QAC breakfast  . metoprolol tartrate  25 mg Oral BID  . pantoprazole (PROTONIX) IV  40 mg Intravenous Q12H  . sodium chloride flush  3 mL Intravenous Q12H   Continuous Infusions: . sodium chloride    . sodium chloride    . sodium chloride    . ceFEPime (MAXIPIME) IV 1 g (12/28/19 0455)  . sodium chloride       LOS: 1 day    Time spent:  35 mins.    Shawna Clamp, MD [facility]   If 7PM-7AM, please contact night-coverage

## 2019-12-29 ENCOUNTER — Encounter
Admission: EM | Disposition: A | Discharge status: Home / Self Care | Payer: Self-pay | Source: Home / Self Care | Attending: Family Medicine

## 2019-12-29 ENCOUNTER — Encounter: Payer: Self-pay | Admitting: Family Medicine

## 2019-12-29 ENCOUNTER — Inpatient Hospital Stay: Payer: Commercial Managed Care - PPO | Admitting: Anesthesiology

## 2019-12-29 HISTORY — PX: ESOPHAGOGASTRODUODENOSCOPY (EGD) WITH PROPOFOL: SHX5813

## 2019-12-29 LAB — MAGNESIUM: Magnesium: 1.8 mg/dL (ref 1.7–2.4)

## 2019-12-29 LAB — CBC
HCT: 19.8 % — ABNORMAL LOW (ref 39.0–52.0)
Hemoglobin: 6.6 g/dL — ABNORMAL LOW (ref 13.0–17.0)
MCH: 31 pg (ref 26.0–34.0)
MCHC: 33.3 g/dL (ref 30.0–36.0)
MCV: 93 fL (ref 80.0–100.0)
Platelets: 105 10*3/uL — ABNORMAL LOW (ref 150–400)
RBC: 2.13 MIL/uL — ABNORMAL LOW (ref 4.22–5.81)
RDW: 16.4 % — ABNORMAL HIGH (ref 11.5–15.5)
WBC: 7.3 10*3/uL (ref 4.0–10.5)
nRBC: 1 % — ABNORMAL HIGH (ref 0.0–0.2)

## 2019-12-29 LAB — BASIC METABOLIC PANEL
Anion gap: 17 — ABNORMAL HIGH (ref 5–15)
BUN: 41 mg/dL — ABNORMAL HIGH (ref 8–23)
CO2: 30 mmol/L (ref 22–32)
Calcium: 7.1 mg/dL — ABNORMAL LOW (ref 8.9–10.3)
Chloride: 92 mmol/L — ABNORMAL LOW (ref 98–111)
Creatinine, Ser: 7.99 mg/dL — ABNORMAL HIGH (ref 0.61–1.24)
GFR calc Af Amer: 8 mL/min — ABNORMAL LOW (ref >60)
GFR calc non Af Amer: 7 mL/min — ABNORMAL LOW (ref >60)
Glucose, Bld: 135 mg/dL — ABNORMAL HIGH (ref 70–99)
Potassium: 4.4 mmol/L (ref 3.5–5.1)
Sodium: 139 mmol/L (ref 135–145)

## 2019-12-29 LAB — GLUCOSE, CAPILLARY
Glucose-Capillary: 184 mg/dL — ABNORMAL HIGH (ref 70–99)
Glucose-Capillary: 142 mg/dL — ABNORMAL HIGH (ref 70–99)
Glucose-Capillary: 125 mg/dL — ABNORMAL HIGH (ref 70–99)
Glucose-Capillary: 216 mg/dL — ABNORMAL HIGH (ref 70–99)
Glucose-Capillary: 118 mg/dL — ABNORMAL HIGH (ref 70–99)

## 2019-12-29 LAB — PHOSPHORUS: Phosphorus: 5.7 mg/dL — ABNORMAL HIGH (ref 2.5–4.6)

## 2019-12-29 LAB — PREPARE RBC (CROSSMATCH)

## 2019-12-29 LAB — HEMOGLOBIN AND HEMATOCRIT, BLOOD
HCT: 20.7 % — ABNORMAL LOW (ref 39.0–52.0)
Hemoglobin: 6.9 g/dL — ABNORMAL LOW (ref 13.0–17.0)

## 2019-12-29 SURGERY — EGD (ESOPHAGOGASTRODUODENOSCOPY)
Anesthesia: General

## 2019-12-29 SURGERY — ESOPHAGOGASTRODUODENOSCOPY (EGD) WITH PROPOFOL
Anesthesia: General

## 2019-12-29 MED ORDER — SODIUM CHLORIDE 0.9 % IV SOLN: INTRAVENOUS | Status: DC

## 2019-12-29 MED ORDER — BUTAMBEN-TETRACAINE-BENZOCAINE 2-2-14 % EX AERO
INHALATION_SPRAY | CUTANEOUS | Status: AC
  Filled 2019-12-29: qty 5

## 2019-12-29 MED ORDER — ALPRAZOLAM 0.5 MG PO TABS: 1.0000 mg | ORAL_TABLET | Freq: Once | ORAL | Status: DC

## 2019-12-29 MED ORDER — SODIUM CHLORIDE 0.9 % IV SOLN
2.0000 g | INTRAVENOUS | Status: DC
  Administered 2019-12-30 – 2020-01-01 (×3): 2 g via INTRAVENOUS
  Filled 2019-12-29 (×3): qty 2

## 2019-12-29 MED ORDER — HALOPERIDOL LACTATE 5 MG/ML IJ SOLN
5.0000 mg | Freq: Once | INTRAMUSCULAR | Status: AC
  Administered 2019-12-29: 5 mg via INTRAVENOUS
  Filled 2019-12-29: qty 1

## 2019-12-29 MED ORDER — PHENYLEPHRINE HCL (PRESSORS) 10 MG/ML IV SOLN
INTRAVENOUS | Status: DC | PRN
  Administered 2019-12-29: 100 ug via INTRAVENOUS

## 2019-12-29 MED ORDER — SODIUM CHLORIDE 0.9% IV SOLUTION: Freq: Once | INTRAVENOUS | Status: DC

## 2019-12-29 MED ORDER — BUTAMBEN-TETRACAINE-BENZOCAINE 2-2-14 % EX AERO
INHALATION_SPRAY | CUTANEOUS | Status: DC | PRN
  Administered 2019-12-29: 1 via TOPICAL

## 2019-12-29 MED ORDER — PEG 3350-KCL-NA BICARB-NACL 420 G PO SOLR
4000.0000 mL | Freq: Once | ORAL | Status: DC
  Filled 2019-12-29: qty 4000

## 2019-12-29 MED ORDER — MAGNESIUM CITRATE PO SOLN
1.0000 | Freq: Once | ORAL | Status: AC
  Administered 2019-12-29: 1 via ORAL
  Filled 2019-12-29: qty 296

## 2019-12-29 MED ORDER — PROPOFOL 10 MG/ML IV BOLUS
INTRAVENOUS | Status: DC | PRN
  Administered 2019-12-29 (×4): 30 mg via INTRAVENOUS

## 2019-12-29 NOTE — Progress Notes (Signed)
PROGRESS NOTE    Patrick Hurst  HCW:[id number] DOB: 1955/11/14 DOA: 12/27/2019 PCP: [name], MD   Brief Narrative:  Patrick Hurst a63 y.o.Caucasian malewith a known history of end-stage renal disease on peritoneal dialysis, CHF, COPD, atrial fibrillation, depression, GERD and goutand obstructive sleep apnea on CPAP, who presented to the emergency room with acute onset of dyspnea with Altered mental status and decreased responsiveness. Patient had a blood glucose of 52 by EMS. He was given 200 mL of IV D10W without significant improvement in his mental status. He was then given 3 mg of IV Narcan and was more alert and conversant. He was better after he came to the ER however became somnolent again with sonorous respirations. He was found to be hypoxemic by EMS as well with pulse currently in the mid to upper 80s for which he was placed on nonrebreather and pulse currently was up to 93%. In the ER he received an ampule of D 50 as his blood glucose was again 61. He has been fully vaccinated for COVID-19.He denied any significant cough or wheezing. No nausea or vomiting or abdominal pain. No chest pain no palpitations.  Upon presentation to the emergency room, blood pressure was 95/47 pulse oximetry of 94% on 100% nonrebreather and otherwise normal vital signs.. He was ordered BiPAP. Labs revealed ABG with pH 7.13 and bicarbonate of 22. CMP showed a BUN of 99 and creatinine of 17.5 with a low glucose of 61 anion gap of 22 and CO2 25. Albumin was 3.2 total protein of 7.6. BNP was 319.9 lactic acid 0.8 and later 0.64 procalcitonin of 1.35. CBC showed significant anemia with hemoglobin of 5.7 hematocrit 17.9 compared to 8.5 and 24.3 about a month ago. INR is 1.3 and PT 15.7. Urine drug screen was positive for tricyclics. Blood cultures were drawn. Chest x-ray showed cardiomegaly with findings of CHF that is new compared to previous chest x-ray. There was no focal  consolidation.  Patient is admitted for acute encephalopathy which could be multifactorial,  Patient at home is on peritoneal dialysis presented with worsening renal functions,  Nephrology consulted patient underwent hemodialysis 2 days in a row,  feels little better,  going to have another hemodialysis session today.  Hemoglobin remains low,  GI consulted recommended GI work-up.  Patient  Underwent EGD today which was unremarkable, scheduled to have colonoscopy tyomorrow  Patient is going to get more blood transfusion to get hemoglobin to a level where it would be appropriate for colonoscopy  Acute encephalopathy has resolved.  Patient is more alert and awake.  Patient will continue BiPAP as needed and at night.  Assessment & Plan:   Active Problems:   Altered mental state   Acute encephalopathy that is multifactorial >>>> Improved   This likely is secondary to hypoglycemia as well as hypoxemia in addition to uremia with end-stage renal disease on peritoneal dialysis. -The patient will be admitted to a progressive unit bed. -Nephrology consulted,  Patient underwent hemodialysis yesterday, feels better -Hypoglycemia protocol will be followed. -Long-acting and high-dose insulin will be held off. -O2 protocol will be followed and BiPAP will be continued as needed especially with history of obstructive sleep apnea.  2. Acute on chronic diastolic CHFwith subsequent hypoxic and possibly hypercarbic acute respiratory failurecontributing to acute encephalopathy and worsening by his symptomatic anemia. - Continue BiPAP and monitor ABG as needed. -The patient will be gently diuresed with IV Lasix pending dialysis. -He had a recent 2D echo on 11/16/2019 that revealed  an EF of 60 to 65% with grade 2 diastolic dysfunction. -Obtain stool Hemoccult. -Patient has received 3 PRBCs so far the hemoglobin is 6.6. will transfuse 2 more units.  3.  Symptomatic anemia; Patient presented with a Hb of 5.7, he  is going to get blood transfusion during hemodialysis. GI consulted, Patient underwent upper GI endoscopy, which was unremarkable.  Scheduled to have colonoscopy tomorrow n.p.o. midnight.   3. Type II diabetes mellitus. -The patient will be placed on supplement coverage with NovoLog and will hold off U5 100 for now.  4. Hypertension. -Norvasc will be placed with holding parameters. -Elavil will be held off.  5. Atrial fibrillation. -We will continue amiodarone and to hold his Eliquis pending  symptomatic anemia.  6. DVT prophylaxis. -SCDs. -Medical prophylaxis currently held off pending stool Hemoccult given symptomatic anemia   DVT prophylaxis: SCDs. Code Status: Full  Family Communication: Discussed with patient and wife at bedside. Disposition Plan:  Patient is from home Anticipated discharge home once medically clear. Patient is not medically clear due to anemia ongoing GI work-up.   Consultants:   Nephrology, gastroenterology,  Procedures: BiPAP, hemodialysis  Antimicrobials: Anti-infectives (From admission, onward)   Start     Dose/Rate Route Frequency Ordered Stop   12/30/19 0800  ceFEPIme (MAXIPIME) 2 g in sodium chloride 0.9 % 100 mL IVPB        2 g 200 mL/hr over 30 Minutes Intravenous Every 24 hours 12/29/19 1246     12/28/19 0500  ceFEPIme (MAXIPIME) 1 g in sodium chloride 0.9 % 100 mL IVPB  Status:  Discontinued        1 g 200 mL/hr over 30 Minutes Intravenous Every 24 hours 12/27/19 0531 12/29/19 1246   12/27/19 0529  vancomycin variable dose per unstable renal function (pharmacist dosing)  Status:  Discontinued         Does not apply See admin instructions 12/27/19 0529 12/28/19 1204   12/27/19 0300  vancomycin (VANCOREADY) IVPB 1250 mg/250 mL        1,250 mg 166.7 mL/hr over 90 Minutes Intravenous  Once 12/27/19 0256 12/27/19 0828   12/27/19 0245  vancomycin (VANCOCIN) IVPB 1000 mg/200 mL premix        1,000 mg 200 mL/hr over 60 Minutes  Intravenous  Once 12/27/19 0241 12/27/19 0419   12/27/19 0245  ceFEPIme (MAXIPIME) 2 g in sodium chloride 0.9 % 100 mL IVPB        2 g 200 mL/hr over 30 Minutes Intravenous  Once 12/27/19 0241 12/27/19 0408      Subjective: Patient was seen and examined at bedside.  Overnight events noted, He became restless and agitated,  pulling IV line, trying to come out of the bed. Patient was given Haldol 1 time which calmed him down.  He underwent EGD which was unremarkable, scheduled to have colonoscopy tomorrow.  Objective: Vitals:   12/29/19 0928 12/29/19 1114 12/29/19 1246 12/29/19 1327  BP: 134/80 (!) 107/52 (!) 120/54 (!) 147/41  Pulse: 92 88 (!) 104 76  Resp: 12 20    Temp: 99 F (37.2 C) 98.4 F (36.9 C)  98.7 F (37.1 C)  TempSrc: Oral Oral  Oral  SpO2: (!) 88%  90%   Weight:      Height:        Intake/Output Summary (Last 24 hours) at 12/29/2019 1411 Last data filed at 12/28/2019 2354 Gross per 24 hour  Intake 856.87 ml  Output 2550 ml  Net -1693.13 ml  Filed Weights   12/27/19 0051 12/27/19 2111  Weight: 135.6 kg (!) 144.6 kg    Examination:  General exam: Appears calm and comfortable  Respiratory system: Clear to auscultation. Respiratory effort normal. Cardiovascular system: S1 & S2 heard, RRR. No JVD, murmurs, rubs, gallops or clicks. No pedal edema. Gastrointestinal system: Abdomen is nondistended, soft and nontender. No organomegaly or masses felt. Normal bowel sounds heard. Central nervous system: Alert and oriented. No focal neurological deficits. Extremities: Symmetric 5 x 5 power. Skin: No rashes, lesions or ulcers Psychiatry: Judgement and insight appear normal. Mood & affect appropriate.     Data Reviewed: I have personally reviewed following labs and imaging studies  CBC: Recent Labs  Lab 12/27/19 0054 12/27/19 0054 12/27/19 0426 12/27/19 1545 12/27/19 2207 12/28/19 0953 12/28/19 1727 12/29/19 0036 12/29/19 1035  WBC 9.4  --  10.7*  --    --  7.4  --   --  7.3  NEUTROABS 7.1  --   --   --   --   --   --   --   --   HGB 5.7*   < > 5.4*   < > 5.5* 6.6* 6.9* 6.9* 6.6*  HCT 17.9*   < > 17.0*   < > 16.7* 19.1* 20.7* 20.7* 19.8*  MCV 96.2  --  96.0  --   --  90.1  --   --  93.0  PLT 109*  --  111*  --   --  117*  --   --  105*   < > = values in this interval not displayed.   Basic Metabolic Panel: Recent Labs  Lab 12/27/19 0054 12/27/19 0625 12/28/19 0953 12/29/19 1035  NA 140  --  135 139  K 4.5  --  5.0 4.4  CL 93*  --  92* 92*  CO2 25  --  26 30  GLUCOSE 61*  --  161* 135*  BUN 99*  --  57* 41*  CREATININE 17.58*  --  10.52* 7.99*  CALCIUM 6.7*  --  6.7* 7.1*  MG  --   --  1.7 1.8  PHOS  --  15.1* 7.8* 5.7*   GFR: Estimated Creatinine Clearance: 14 mL/min (A) (by C-G formula based on SCr of 7.99 mg/dL (H)). Liver Function Tests: Recent Labs  Lab 12/27/19 0054  AST 15  ALT 18  ALKPHOS 114  BILITOT 0.9  PROT 7.6  ALBUMIN 3.2*   Recent Labs  Lab 12/27/19 0054  LIPASE 29   No results for input(s): AMMONIA in the last 168 hours. Coagulation Profile: Recent Labs  Lab 12/27/19 0054  INR 1.3*   Cardiac Enzymes: No results for input(s): CKTOTAL, CKMB, CKMBINDEX, TROPONINI in the last 168 hours. BNP (last 3 results) No results for input(s): PROBNP in the last 8760 hours. HbA1C: No results for input(s): HGBA1C in the last 72 hours. CBG: Recent Labs  Lab 12/28/19 1234 12/28/19 2148 12/29/19 0218 12/29/19 0731 12/29/19 1116  GLUCAP 137* 127* 184* 142* 125*   Lipid Profile: No results for input(s): CHOL, HDL, LDLCALC, TRIG, CHOLHDL, LDLDIRECT in the last 72 hours. Thyroid Function Tests: No results for input(s): TSH, T4TOTAL, FREET4, T3FREE, THYROIDAB in the last 72 hours. Anemia Panel: No results for input(s): VITAMINB12, FOLATE, FERRITIN, TIBC, IRON, RETICCTPCT in the last 72 hours. Sepsis Labs: Recent Labs  Lab 12/27/19 0054 12/27/19 0307  PROCALCITON 1.35  --   LATICACIDVEN 0.8 0.6     Recent Results (from the  past 240 hour(s))  SARS Coronavirus 2 by RT PCR (hospital order, performed in [facility] hospital lab) Nasopharyngeal Nasopharyngeal Swab     Status: None   Collection Time: 12/27/19 12:54 AM   Specimen: Nasopharyngeal Swab  Result Value Ref Range Status   SARS Coronavirus 2 NEGATIVE NEGATIVE Final    Comment: (NOTE) SARS-CoV-2 target nucleic acids are NOT DETECTED.  The SARS-CoV-2 RNA is generally detectable in upper and lower respiratory specimens during the acute phase of infection. The lowest concentration of SARS-CoV-2 viral copies this assay can detect is 250 copies / mL. A negative result does not preclude SARS-CoV-2 infection and should not be used as the sole basis for treatment or other patient management decisions.  A negative result may occur with improper specimen collection / handling, submission of specimen other than nasopharyngeal swab, presence of viral mutation(s) within the areas targeted by this assay, and inadequate number of viral copies (<250 copies / mL). A negative result must be combined with clinical observations, patient history, and epidemiological information.  Fact Sheet for Patients:   StrictlyIdeas.no  Fact Sheet for Healthcare Providers: BankingDealers.co.za  This test is not yet approved or  cleared by the [place] FDA and has been authorized for detection and/or diagnosis of SARS-CoV-2 by FDA under an Emergency Use Authorization (EUA).  This EUA will remain in effect (meaning this test can be used) for the duration of the COVID-19 declaration under Section 564(b)(1) of the Act, 21 U.S.C. section 360bbb-3(b)(1), unless the authorization is terminated or revoked sooner.  Performed at [facility], [address] [place], [place] [address]   Blood culture (routine x 2)     Status: None (Preliminary result)   Collection Time: 12/27/19  3:07 AM    Specimen: BLOOD  Result Value Ref Range Status   Specimen Description BLOOD RIGHT HAND  Final   Special Requests   Final    BOTTLES DRAWN AEROBIC AND ANAEROBIC Blood Culture adequate volume   Culture   Final    NO GROWTH 1 DAY Performed at [facility], [address] [place], [place] [address]    Report Status PENDING  Incomplete  Blood culture (routine x 2)     Status: None (Preliminary result)   Collection Time: 12/27/19  3:07 AM   Specimen: BLOOD  Result Value Ref Range Status   Specimen Description BLOOD RIGHT [facility]  Final   Special Requests   Final    BOTTLES DRAWN AEROBIC AND ANAEROBIC Blood Culture adequate volume   Culture   Final    NO GROWTH 1 DAY Performed at [facility], [address] [place], [place] [address]    Report Status PENDING  Incomplete  MRSA PCR Screening     Status: None   Collection Time: 12/27/19  3:45 PM   Specimen: Nasopharyngeal  Result Value Ref Range Status   MRSA by PCR NEGATIVE NEGATIVE Final    Comment:        The GeneXpert MRSA Assay (FDA approved for NASAL specimens only), is one component of a comprehensive MRSA colonization surveillance program. It is not intended to diagnose MRSA infection nor to guide or monitor treatment for MRSA infections. Performed at [facility], [address], [place], [place] [address]     Radiology Studies: US Venous Img Lower Bilateral (DVT)  Result Date: 12/28/2019 CLINICAL DATA:  Bilateral lower extremity pain and edema for the past months. Patient is currently on anticoagulation. Evaluate for DVT. EXAM: BILATERAL  LOWER EXTREMITY VENOUS DOPPLER ULTRASOUND TECHNIQUE: Gray-scale sonography with graded compression, as well as color Doppler and duplex ultrasound were performed to evaluate the lower extremity deep venous systems from the level of the common femoral vein and including the common femoral, femoral, profunda femoral, popliteal and calf veins including the  posterior tibial, peroneal and gastrocnemius veins when visible. The superficial great saphenous vein was also interrogated. Spectral Doppler was utilized to evaluate flow at rest and with distal augmentation maneuvers in the common femoral, femoral and popliteal veins. COMPARISON:  None FINDINGS: RIGHT LOWER EXTREMITY Common Femoral Vein: No evidence of thrombus. Normal compressibility, respiratory phasicity and response to augmentation. Saphenofemoral Junction: No evidence of thrombus. Normal compressibility and flow on color Doppler imaging. Profunda Femoral Vein: No evidence of thrombus. Normal compressibility and flow on color Doppler imaging. Femoral Vein: No evidence of thrombus. Normal compressibility, respiratory phasicity and response to augmentation. Popliteal Vein: No evidence of thrombus within either of the duplicated popliteal veins. Normal compressibility, respiratory phasicity and response to augmentation. Calf Veins: No evidence of thrombus. Normal compressibility and flow on color Doppler imaging. Superficial Great Saphenous Vein: No evidence of thrombus. Normal compressibility. Venous Reflux:  None. Other Findings:  None. LEFT LOWER EXTREMITY Common Femoral Vein: No evidence of thrombus. Normal compressibility, respiratory phasicity and response to augmentation. Saphenofemoral Junction: No evidence of thrombus. Normal compressibility and flow on color Doppler imaging. Profunda Femoral Vein: No evidence of thrombus. Normal compressibility and flow on color Doppler imaging. Femoral Vein: No evidence of thrombus. Normal compressibility, respiratory phasicity and response to augmentation. Popliteal Vein: No evidence of thrombus. Normal compressibility, respiratory phasicity and response to augmentation. Calf Veins: No evidence of thrombus. Normal compressibility and flow on color Doppler imaging. Superficial Great Saphenous Vein: No evidence of thrombus. Normal compressibility. Venous Reflux:  None.  Other Findings:  None. IMPRESSION: No evidence of DVT within either lower extremity. Electronically Signed   By: Sandi Mariscal M.D.   On: 12/28/2019 12:49    Scheduled Meds: . sodium chloride   Intravenous Once  . sodium chloride   Intravenous Once  . amiodarone  200 mg Oral Daily  . amLODipine  5 mg Oral Daily  . vitamin C  1,000 mg Oral BID  . atorvastatin  40 mg Oral Daily  . calcitRIOL  0.5 mcg Oral Daily  . Chlorhexidine Gluconate Cloth  6 each Topical Q0600  . febuxostat  80 mg Oral Daily  . furosemide  40 mg Intravenous Q12H  . insulin aspart  0-9 Units Subcutaneous TID PC & HS  . lactulose  20 g Oral Daily  . levothyroxine  100 mcg Oral QAC breakfast  . magnesium citrate  1 Bottle Oral Once  . metoprolol tartrate  25 mg Oral BID  . pantoprazole (PROTONIX) IV  40 mg Intravenous Q12H  . polyethylene glycol-electrolytes  4,000 mL Oral Once  . sodium chloride flush  3 mL Intravenous Q12H   Continuous Infusions: . sodium chloride    . sodium chloride    . [START ON 12/30/2019] ceFEPime (MAXIPIME) IV    . sodium chloride       LOS: 2 days    Time spent:  35 mins.    Shawna Clamp, MD [facility]   If 7PM-7AM, please contact night-coverage

## 2019-12-29 NOTE — Progress Notes (Signed)
Dr. Dwyane Dee on the unit. Discussed plan of care with him. At this time we will hold medications until after the EGD. Hold insulin as well at this time, Spoke with ENDO updated her wit vital signs. Noted that pt had a red mews. Pt is restless. Reassessed and MEWS is green. Wife very upset that pt has not eaten a full meal in 3 days. She is also inquiring as to why the pt is bleed. Explained why he was not allowed to have food at this time.Verbalized understand but will need reinforcement.

## 2019-12-29 NOTE — Progress Notes (Signed)
LUISANGEL WAINRIGHT  MRN: [id number]  DOB/AGE: 09-28-1955 64 y.o.  Primary Care Physician:Fisher, Kirstie Peri, MD  Admit date: 12/27/2019  Chief Complaint:  Chief Complaint  Patient presents with  . Shortness of Breath    S-Pt presented on  12/27/2019 with  Chief Complaint  Patient presents with  . Shortness of Breath  . Patient is laying uncomfortably in the bed. Pt has multiple comments " I am short of breath,  I am hungry"  Patient spouse was present in the room as well. Patient spouse main concern was" if he can eat?"  I then discussed with the patient about his possibly needing colonoscopy tomorrow.  Patient and the family voiced understanding  Medications   . sodium chloride   Intravenous Once  . amiodarone  200 mg Oral Daily  . amLODipine  5 mg Oral Daily  . vitamin C  1,000 mg Oral BID  . atorvastatin  40 mg Oral Daily  . calcitRIOL  0.5 mcg Oral Daily  . Chlorhexidine Gluconate Cloth  6 each Topical Q0600  . febuxostat  80 mg Oral Daily  . furosemide  40 mg Intravenous Q12H  . insulin aspart  0-9 Units Subcutaneous TID PC & HS  . lactulose  20 g Oral Daily  . levothyroxine  100 mcg Oral QAC breakfast  . metoprolol tartrate  25 mg Oral BID  . pantoprazole (PROTONIX) IV  40 mg Intravenous Q12H  . sodium chloride flush  3 mL Intravenous Q12H         WVP:XTGGY from the symptoms mentioned above,there are no other symptoms referable to all systems reviewed.  Physical Exam: Vital signs in last 24 hours: Temp:  [98.2 F (36.8 C)-99.3 F (37.4 C)] 99 F (37.2 C) (08/28 0928) Pulse Rate:  [68-132] 92 (08/28 0928) Resp:  [12-23] 12 (08/28 0928) BP: (120-145)/(53-90) 134/80 (08/28 0928) SpO2:  [83 %-96 %] 88 % (08/28 0928) FiO2 (%):  [60 %] 60 % (08/27 1406) Weight change:  Last BM Date: 12/27/19  Intake/Output from previous day: 08/27 0701 - 08/28 0700 In: 856.9 [P.O.:420; I.V.:99.4; Blood:337.5] Out: 2550 [Urine:50] No intake/output data  recorded.   Physical Exam: General- pt is awake , follows commands respond apparently Resp- No acute REsp distress, decreased breath sound at bases CVS- S1S2 regular in rate and rhythm GIT- BS+, soft, NT, ND, PD catheter in situ, morbidly obese, distended EXT-1+ LE Edema, no cyanosis   Lab Results: CBC Recent Labs    12/27/19 0426 12/27/19 1545 12/28/19 0953 12/28/19 0953 12/28/19 1727 12/29/19 0036  WBC 10.7*  --  7.4  --   --   --   HGB 5.4*   < > 6.6*   < > 6.9* 6.9*  HCT 17.0*   < > 19.1*   < > 20.7* 20.7*  PLT 111*  --  117*  --   --   --    < > = values in this interval not displayed.    BMET Recent Labs    12/27/19 0054 12/28/19 0953  NA 140 135  K 4.5 5.0  CL 93* 92*  CO2 25 26  GLUCOSE 61* 161*  BUN 99* 57*  CREATININE 17.58* 10.52*  CALCIUM 6.7* 6.7*    MICRO Recent Results (from the past 240 hour(s))  SARS Coronavirus 2 by RT PCR (hospital order, performed in [facility] hospital lab) Nasopharyngeal Nasopharyngeal Swab     Status: None   Collection Time: 12/27/19 12:54 AM   Specimen: Nasopharyngeal  Swab  Result Value Ref Range Status   SARS Coronavirus 2 NEGATIVE NEGATIVE Final    Comment: (NOTE) SARS-CoV-2 target nucleic acids are NOT DETECTED.  The SARS-CoV-2 RNA is generally detectable in upper and lower respiratory specimens during the acute phase of infection. The lowest concentration of SARS-CoV-2 viral copies this assay can detect is 250 copies / mL. A negative result does not preclude SARS-CoV-2 infection and should not be used as the sole basis for treatment or other patient management decisions.  A negative result may occur with improper specimen collection / handling, submission of specimen other than nasopharyngeal swab, presence of viral mutation(s) within the areas targeted by this assay, and inadequate number of viral copies (<250 copies / mL). A negative result must be combined with clinical observations, patient history, and  epidemiological information.  Fact Sheet for Patients:   StrictlyIdeas.no  Fact Sheet for Healthcare Providers: BankingDealers.co.za  This test is not yet approved or  cleared by the [place] FDA and has been authorized for detection and/or diagnosis of SARS-CoV-2 by FDA under an Emergency Use Authorization (EUA).  This EUA will remain in effect (meaning this test can be used) for the duration of the COVID-19 declaration under Section 564(b)(1) of the Act, 21 U.S.C. section 360bbb-3(b)(1), unless the authorization is terminated or revoked sooner.  Performed at [facility], [address] [place], [place] [address]   Blood culture (routine x 2)     Status: None (Preliminary result)   Collection Time: 12/27/19  3:07 AM   Specimen: BLOOD  Result Value Ref Range Status   Specimen Description BLOOD RIGHT HAND  Final   Special Requests   Final    BOTTLES DRAWN AEROBIC AND ANAEROBIC Blood Culture adequate volume   Culture   Final    NO GROWTH 1 DAY Performed at [facility], [address] [place], [place] [address]    Report Status PENDING  Incomplete  Blood culture (routine x 2)     Status: None (Preliminary result)   Collection Time: 12/27/19  3:07 AM   Specimen: BLOOD  Result Value Ref Range Status   Specimen Description BLOOD RIGHT [facility]  Final   Special Requests   Final    BOTTLES DRAWN AEROBIC AND ANAEROBIC Blood Culture adequate volume   Culture   Final    NO GROWTH 1 DAY Performed at [facility], [address], [place], [place] [address]    Report Status PENDING  Incomplete  MRSA PCR Screening     Status: None   Collection Time: 12/27/19  3:45 PM   Specimen: Nasopharyngeal  Result Value Ref Range Status   MRSA by PCR NEGATIVE NEGATIVE Final    Comment:        The GeneXpert MRSA Assay (FDA approved for NASAL specimens only), is one component of a comprehensive MRSA  colonization surveillance program. It is not intended to diagnose MRSA infection nor to guide or monitor treatment for MRSA infections. Performed at [facility], [address] [place], [place] [address]       Lab Results  Component Value Date   PTH 222 (H) 12/27/2019   CALCIUM 6.7 (L) 12/28/2019   CAION 0.97 (L) 11/08/2019   PHOS 7.8 (H) 12/28/2019               Impression:    64 y.o. male with past medical history of ESRD on PD with intermittent hemodialysis for volume overload, chronic diastolic heart failure, Bell's  palsy, carpal tunnel syndrome, depression, GERD, gout, anemia of chronic kidney disease, secondary hyper parathyroidism, chronic respiratory failure who presents now with increasing shortness of breath, volume overload, acute respiratory failure, severe anemia.  [facility] [facility] with use of intermittent hemodialysis CCPD 9 hours 5 exchanges   1)Renal end-stage renal disease Patient is on peritoneal dialysis with use of intermittent hemodialysis to help with fluid overload Patient is on CCPD 5.  Exchanges as an outpatient As inpatient patient is currently undergoing hemodialysis. Patient is in fluid overload especially if he is getting 2 units of blood today he is at risk of worsening We will dialyze patient again today  2)HTN Blood pressure is stable   3)Anemia of chronic disease  HGb is not at goal (9--11) Patient is scheduled to get 2 units of PRBC  4) secondary hyperparathyroidism -CKD Mineral-Bone Disorder   Secondary Hyperparathyroidism present .  Phosphorus is not at goal.   5) GI blood loss  Patient has underwent EGD today.    6) electrolytes   sodium Normonatremic   potassium Normokalemic    7)Acid base Co2 at goal  8) acute respiratory failure Patient was in acute respiratory failure secondary to fluid overload Intermittent hemodialysis helped Patient is currently off  BiPAP but is still dypneic  Will dialyze today    9) altered mental status Clinically better  Patient does have a sitter in the room This is being closely followed by the primary team    Plan:  Will dialyze today Agree with 2 units of PRBC     Lyrik Dockstader s Hanne Kegg 12/29/2019, 10:21 AM

## 2019-12-29 NOTE — Transfer of Care (Signed)
Immediate Anesthesia Transfer of Care Note  Patient: Patrick Hurst  Procedure(s) Performed: ESOPHAGOGASTRODUODENOSCOPY (EGD) WITH PROPOFOL (N/A )  Patient Location: PACU  Anesthesia Type:General  Level of Consciousness: awake, alert  and oriented  Airway & Oxygen Therapy: Patient Spontanous Breathing  Post-op Assessment: Report given to RN  Post vital signs: Reviewed and stable  Last Vitals:  Vitals Value Taken Time  BP    Temp    Pulse    Resp    SpO2      Last Pain:  Vitals:   12/29/19 1114  TempSrc: Oral  PainSc:          Complications: No complications documented.

## 2019-12-29 NOTE — Anesthesia Postprocedure Evaluation (Signed)
Anesthesia Post Note  Patient: Patrick Hurst  Procedure(s) Performed: ESOPHAGOGASTRODUODENOSCOPY (EGD) WITH PROPOFOL (N/A )  Patient location during evaluation: Endoscopy Anesthesia Type: General Level of consciousness: awake and alert Pain management: pain level controlled Vital Signs Assessment: post-procedure vital signs reviewed and stable Respiratory status: spontaneous breathing, nonlabored ventilation, respiratory function stable and patient connected to nasal cannula oxygen Cardiovascular status: blood pressure returned to baseline and stable Postop Assessment: no apparent nausea or vomiting Anesthetic complications: no   No complications documented.   Last Vitals:  Vitals:   12/29/19 1246 12/29/19 1327  BP: (!) 120/54 (!) 147/41  Pulse: (!) 104 76  Resp:    Temp:  37.1 C  SpO2: 90%     Last Pain:  Vitals:   12/29/19 1327  TempSrc: Oral  PainSc:                  Arita Miss

## 2019-12-29 NOTE — Anesthesia Preprocedure Evaluation (Signed)
Anesthesia Evaluation  Patient identified by MRN, date of birth, ID band Patient awake    Reviewed: Allergy & Precautions, H&P , NPO status , reviewed documented beta blocker date and time   Airway Mallampati: III  TM Distance: >3 FB Neck ROM: limited   Comment: Large beard Dental  (+) Poor Dentition, Chipped, Missing Very poor dentition, none loose:   Pulmonary sleep apnea, Continuous Positive Airway Pressure Ventilation and Oxygen sleep apnea , pneumonia, resolved, COPD,  oxygen dependent, former smoker,     + decreased breath sounds      Cardiovascular hypertension, +CHF  Normal cardiovascular exam  03/2019 ECHO IMPRESSIONS    1. Left ventricular ejection fraction, by visual estimation, is 55 to  60%. The left ventricle has normal function. There is mildly increased  left ventricular hypertrophy.  2. Global right ventricle has normal systolic function.The right  ventricular size is normal. No increase in right ventricular wall  thickness.  3. Left atrial size was normal.  4. Right atrial size was normal.  5. The mitral valve is normal in structure. Mild mitral valve  regurgitation.  6. The tricuspid valve is normal in structure. Tricuspid valve  regurgitation is trivial.  7. The aortic valve is normal in structure. Aortic valve regurgitation is  not visualized.  8. The pulmonic valve was grossly normal. Pulmonic valve regurgitation is  not visualized.    Neuro/Psych PSYCHIATRIC DISORDERS Depression  Neuromuscular disease    GI/Hepatic PUD, GERD  Controlled,  Endo/Other  diabetes, Type 2Hypothyroidism Morbid obesity  Renal/GU CRF and DialysisRenal diseaseDialyzed yesterday     Musculoskeletal  (+) Arthritis ,   Abdominal (+) + obese,   Peds  Hematology  (+) Blood dyscrasia, anemia ,   Anesthesia Other Findings Past Medical History: 06/21/2018: Acute on chronic diastolic CHF (congestive heart failure)   ([place]) 02/12/2015: Bell palsy 02/12/2015: Carpal tunnel syndrome No date: Dependence on nocturnal oxygen therapy     Comment:  2 LITERS WITH BIPAP No date: Depression No date: Gastric ulcer No date: GERD (gastroesophageal reflux disease) No date: Gout No date: History of chicken pox 03/04/2019: Multifocal pneumonia No date: Sleep apnea treated with nocturnal BiPAP  Past Surgical History: 2014 and 2015: CATARACT EXTRACTION; Bilateral 04/28/2011: CHOLECYSTECTOMY     Comment:  Laproscopic; Dr. Pat Patrick 05/24/2019: DIALYSIS/PERMA CATHETER INSERTION; N/A     Comment:  Procedure: DIALYSIS/PERMA CATHETER INSERTION;  Surgeon:               Algernon Huxley, MD;  Location: [place] [facility];                Service: Cardiovascular;  Laterality: N/A; 10/09/2019: DIALYSIS/PERMA CATHETER INSERTION; N/A     Comment:  Procedure: DIALYSIS/PERMA CATHETER INSERTION;  Surgeon:               Katha Cabal, MD;  Location: [place] [facility];               Service: Cardiovascular;  Laterality: N/A; No date: EYE SURGERY No date: GALLBLADDER SURGERY 2019: LASIK; Bilateral     Comment:  medical 2012: SHOULDER SURGERY; Right     Comment:  Dr. Leanor Kail     Reproductive/Obstetrics                             Anesthesia Physical  Anesthesia Plan  ASA: IV  Anesthesia Plan: General   Post-op Pain Management:  Regional for Post-op  pain   Induction: Intravenous  PONV Risk Score and Plan: 2 and Treatment may vary due to age or medical condition, TIVA and Ondansetron  Airway Management Planned: Natural Airway  Additional Equipment: None  Intra-op Plan:   Post-operative Plan:   Informed Consent: I have reviewed the patients History and Physical, chart, labs and discussed the procedure including the risks, benefits and alternatives for the proposed anesthesia with the patient or authorized representative who has indicated his/her understanding and acceptance.      Dental Advisory Given  Plan Discussed with: CRNA  Anesthesia Plan Comments: (Patient's pulmonary status improved compared to yesterday. He is stable on nasal cannula. Hemoglobin 6.6 despite blood transfusions. Patient currently hemodynamically stable. Received dialysis yesterday. Evaluating risks v benefits, still reasonable to proceed with endoscopy to evaluate for cause of falling hemoglobin.  Discussed risks of anesthesia with patient, including possibility of difficulty with spontaneous ventilation under anesthesia necessitating airway intervention, PONV, and rare risks such as cardiac or respiratory or neurological events. Patient understands. Patient informed about increased incidence of above perioperative risk due to high BMI. Patient understands. )        Anesthesia Quick Evaluation

## 2019-12-29 NOTE — Progress Notes (Addendum)
Patient very restless, agitated, pulling out lines, O2 and tele. Wants to get up and walk out of here. Paged the provider oncall who ordered Haldol 5mg  IV/ medication(see MAR).  Patient needing redirection more and more often. Refusing VS check. Very confused. Will continue to monitor.

## 2019-12-29 NOTE — Op Note (Addendum)
[facility] Patient Name: Patrick Hurst Procedure Date: 12/29/2019 12:03 PM MRN: [id number] Account #: [id number] Date of Birth: 1956/03/10 Admit Type: Inpatient Age: 64 Room: [facility] ENDO ROOM 4 Gender: Male Note Status: Supervisor Override Procedure:             Upper GI endoscopy Indications:           Iron deficiency anemia secondary to chronic blood loss Providers:             Lin Landsman MD, MD Referring MD:          No Local Md, MD (Referring MD) Medicines:             Monitored Anesthesia Care Complications:         No immediate complications. Estimated blood loss: None. Procedure:             Pre-Anesthesia Assessment:                        - Prior to the procedure, a History and Physical was                         performed, and patient medications and allergies were                         reviewed. The patient is competent. The risks and                         benefits of the procedure and the sedation options and                         risks were discussed with the patient. All questions                         were answered and informed consent was obtained.                         Patient identification and proposed procedure were                         verified by the physician, the nurse, the                         anesthesiologist, the anesthetist and the technician                         in the pre-procedure area in the procedure room in the                         endoscopy suite. Mental Status Examination: alert and                         oriented. Airway Examination: normal oropharyngeal                         airway and neck mobility. Respiratory Examination:                         clear to auscultation. CV Examination: irregularly  irregular rate and rhythm. Prophylactic Antibiotics:                         The patient does not require prophylactic antibiotics.                          Prior Anticoagulants: The patient has taken Eliquis                         (apixaban), last dose was 3 days prior to procedure.                         ASA Grade Assessment: III - A patient with severe                         systemic disease. After reviewing the risks and                         benefits, the patient was deemed in satisfactory                         condition to undergo the procedure. The anesthesia                         plan was to use monitored anesthesia care (MAC).                         Immediately prior to administration of medications,                         the patient was re-assessed for adequacy to receive                         sedatives. The heart rate, respiratory rate, oxygen                         saturations, blood pressure, adequacy of pulmonary                         ventilation, and response to care were monitored                         throughout the procedure. The physical status of the                         patient was re-assessed after the procedure.                        After obtaining informed consent, the endoscope was                         passed under direct vision. Throughout the procedure,                         the patient's blood pressure, pulse, and oxygen                         saturations were monitored continuously. The Endoscope  was introduced through the mouth, and advanced to the                         second part of duodenum. The upper GI endoscopy was                         accomplished without difficulty. The patient tolerated                         the procedure well. Findings:      The examined duodenum was normal.      Mild portal hypertensive gastropathy was found in the entire examined       stomach. Biopsies were taken with a cold forceps for Helicobacter pylori       testing.      The cardia and gastric fundus were normal on retroflexion.      The gastroesophageal junction and  examined esophagus were normal. Impression:            - Normal examined duodenum.                        - Portal hypertensive gastropathy. Biopsied.                        - Normal gastroesophageal junction and esophagus. Recommendation:        - Return patient to hospital ward for ongoing care.                        - Clear liquid diet today.                        - Perform a colonoscopy tomorrow. Procedure Code(s):     --- Professional ---                        [phone], Esophagogastroduodenoscopy, flexible,                         transoral; with biopsy, single or multiple Diagnosis Code(s):     --- Professional ---                        K76.6, Portal hypertension                        K31.89, Other diseases of stomach and duodenum                        D50.0, Iron deficiency anemia secondary to blood loss                         (chronic) CPT copyright 2019 American Medical Association. All rights reserved. The codes documented in this report are preliminary and upon coder review may  be revised to meet current compliance requirements. Dr. Ulyess Mort Lin Landsman MD, MD 12/29/2019 12:38:37 PM This report has been signed electronically. Number of Addenda: 0 Note Initiated On: 12/29/2019 12:03 PM Estimated Blood Loss:  Estimated blood loss: none.      [facility]

## 2019-12-30 ENCOUNTER — Inpatient Hospital Stay: Payer: Commercial Managed Care - PPO | Admitting: Certified Registered"

## 2019-12-30 ENCOUNTER — Encounter
Admission: EM | Disposition: A | Discharge status: Home / Self Care | Payer: Self-pay | Source: Home / Self Care | Attending: Family Medicine

## 2019-12-30 ENCOUNTER — Encounter: Payer: Self-pay | Admitting: Family Medicine

## 2019-12-30 DIAGNOSIS — D649 Anemia, unspecified: Secondary | ICD-10-CM

## 2019-12-30 HISTORY — PX: COLONOSCOPY WITH PROPOFOL: SHX5780

## 2019-12-30 LAB — BPAM RBC
Blood Product Expiration Date: 202108282359
Blood Product Expiration Date: 202109232359
Blood Product Expiration Date: 202109242359
Blood Product Expiration Date: 202109302359
Blood Product Expiration Date: 202109302359
ISSUE DATE / TIME: 202108261704
ISSUE DATE / TIME: 202108270045
ISSUE DATE / TIME: 202108271158
ISSUE DATE / TIME: 202108281512
ISSUE DATE / TIME: 202108281614
Unit Type and Rh: 600
Unit Type and Rh: 6200
Unit Type and Rh: 6200
Unit Type and Rh: 6200
Unit Type and Rh: 6200

## 2019-12-30 LAB — BASIC METABOLIC PANEL
Anion gap: 10 (ref 5–15)
BUN: 26 mg/dL — ABNORMAL HIGH (ref 8–23)
CO2: 32 mmol/L (ref 22–32)
Calcium: 7.2 mg/dL — ABNORMAL LOW (ref 8.9–10.3)
Chloride: 91 mmol/L — ABNORMAL LOW (ref 98–111)
Creatinine, Ser: 6.15 mg/dL — ABNORMAL HIGH (ref 0.61–1.24)
GFR calc Af Amer: 10 mL/min — ABNORMAL LOW (ref >60)
GFR calc non Af Amer: 9 mL/min — ABNORMAL LOW (ref >60)
Glucose, Bld: 105 mg/dL — ABNORMAL HIGH (ref 70–99)
Potassium: 3.4 mmol/L — ABNORMAL LOW (ref 3.5–5.1)
Sodium: 133 mmol/L — ABNORMAL LOW (ref 135–145)

## 2019-12-30 LAB — TYPE AND SCREEN
ABO/RH(D): A POS
Antibody Screen: NEGATIVE
Unit division: 0
Unit division: 0
Unit division: 0
Unit division: 0
Unit division: 0

## 2019-12-30 LAB — CBC
HCT: 23.6 % — ABNORMAL LOW (ref 39.0–52.0)
Hemoglobin: 8.1 g/dL — ABNORMAL LOW (ref 13.0–17.0)
MCH: 31 pg (ref 26.0–34.0)
MCHC: 34.3 g/dL (ref 30.0–36.0)
MCV: 90.4 fL (ref 80.0–100.0)
Platelets: 104 10*3/uL — ABNORMAL LOW (ref 150–400)
RBC: 2.61 MIL/uL — ABNORMAL LOW (ref 4.22–5.81)
RDW: 15.7 % — ABNORMAL HIGH (ref 11.5–15.5)
WBC: 5.7 10*3/uL (ref 4.0–10.5)
nRBC: 1.6 % — ABNORMAL HIGH (ref 0.0–0.2)

## 2019-12-30 LAB — GLUCOSE, CAPILLARY
Glucose-Capillary: 131 mg/dL — ABNORMAL HIGH (ref 70–99)
Glucose-Capillary: 121 mg/dL — ABNORMAL HIGH (ref 70–99)
Glucose-Capillary: 191 mg/dL — ABNORMAL HIGH (ref 70–99)
Glucose-Capillary: 159 mg/dL — ABNORMAL HIGH (ref 70–99)

## 2019-12-30 LAB — MAGNESIUM: Magnesium: 1.7 mg/dL (ref 1.7–2.4)

## 2019-12-30 LAB — PHOSPHORUS: Phosphorus: 4.6 mg/dL (ref 2.5–4.6)

## 2019-12-30 SURGERY — COLONOSCOPY WITH PROPOFOL
Anesthesia: General

## 2019-12-30 MED ORDER — APIXABAN 2.5 MG PO TABS
2.5000 mg | ORAL_TABLET | Freq: Two times a day (BID) | ORAL | Status: DC
  Administered 2019-12-31 – 2020-01-01 (×3): 2.5 mg via ORAL
  Filled 2019-12-30 (×3): qty 1

## 2019-12-30 MED ORDER — PROPOFOL 10 MG/ML IV BOLUS
INTRAVENOUS | Status: AC
  Filled 2019-12-30: qty 40

## 2019-12-30 MED ORDER — PROPOFOL 10 MG/ML IV BOLUS
INTRAVENOUS | Status: DC | PRN
  Administered 2019-12-30: 50 mg via INTRAVENOUS

## 2019-12-30 MED ORDER — PROPOFOL 500 MG/50ML IV EMUL
INTRAVENOUS | Status: DC | PRN
  Administered 2019-12-30: 125 ug/kg/min via INTRAVENOUS

## 2019-12-30 NOTE — Op Note (Signed)
[facility] Gastroenterology Patient Name: Patrick Hurst Procedure Date: 12/30/2019 9:50 AM MRN: [id number] Account #: [id number] Date of Birth: 12/29/1955 Admit Type: Inpatient Age: 64 Room: [facility] ENDO ROOM 4 Gender: Male Note Status: Finalized Procedure:             Colonoscopy Indications:           Melena Providers:             Lin Landsman MD, MD Referring MD:          No Local Md, MD (Referring MD) Medicines:             Monitored Anesthesia Care Complications:         No immediate complications. Estimated blood loss: None. Procedure:             Pre-Anesthesia Assessment:                        - Prior to the procedure, a History and Physical was                         performed, and patient medications and allergies were                         reviewed. The patient is competent. The risks and                         benefits of the procedure and the sedation options and                         risks were discussed with the patient. All questions                         were answered and informed consent was obtained.                         Patient identification and proposed procedure were                         verified by the physician, the nurse, the                         anesthesiologist, the anesthetist and the technician                         in the pre-procedure area in the procedure room in the                         endoscopy suite. Mental Status Examination: alert and                         oriented. Airway Examination: normal oropharyngeal                         airway and neck mobility. Respiratory Examination:                         clear to auscultation. CV Examination: normal.  Prophylactic Antibiotics: The patient does not require                         prophylactic antibiotics. Prior Anticoagulants: The                         patient has taken Eliquis (apixaban), last dose was 3                          days prior to procedure. ASA Grade Assessment: III - A                         patient with severe systemic disease. After reviewing                         the risks and benefits, the patient was deemed in                         satisfactory condition to undergo the procedure. The                         anesthesia plan was to use monitored anesthesia care                         (MAC). Immediately prior to administration of                         medications, the patient was re-assessed for adequacy                         to receive sedatives. The heart rate, respiratory                         rate, oxygen saturations, blood pressure, adequacy of                         pulmonary ventilation, and response to care were                         monitored throughout the procedure. The physical                         status of the patient was re-assessed after the                         procedure.                        After obtaining informed consent, the colonoscope was                         passed under direct vision. Throughout the procedure,                         the patient's blood pressure, pulse, and oxygen                         saturations were monitored continuously. The  Colonoscope was introduced through the anus and                         advanced to the the cecum, identified by appendiceal                         orifice and ileocecal valve. The colonoscopy was                         performed with moderate difficulty due to inadequate                         bowel prep, significant looping and the patient's body                         habitus. Successful completion of the procedure was                         aided by applying abdominal pressure and lavage. The                         patient tolerated the procedure fairly well. The                         quality of the bowel preparation was poor. Findings:      The perianal and  digital rectal examinations were normal. Pertinent       negatives include normal sphincter tone and no palpable rectal lesions.      A large amount of semi-liquid stool was found in the entire colon,       precluding visualization.      Normal mucosa was found in the entire colon. Impression:            - Preparation of the colon was poor.                        - Stool in the entire examined colon.                        - Normal mucosa in the entire examined colon.                        - No specimens collected. Recommendation:        - Return patient to hospital ward for possible                         discharge same day.                        - Resume previous diet today.                        - Continue present medications.                        - To visualize the small bowel, perform video capsule                         endoscopy as outpt.                        -  Return to GI clinic in 4 weeks. Procedure Code(s):     --- Professional ---                        [phone], Colonoscopy, flexible; diagnostic, including                         collection of specimen(s) by brushing or washing, when                         performed (separate procedure) Diagnosis Code(s):     --- Professional ---                        K92.1, Melena (includes Hematochezia) CPT copyright 2019 American Medical Association. All rights reserved. The codes documented in this report are preliminary and upon coder review may  be revised to meet current compliance requirements. Dr. Ulyess Mort Lin Landsman MD, MD 12/30/2019 1:23:56 PM This report has been signed electronically. Number of Addenda: 0 Note Initiated On: 12/30/2019 9:50 AM Scope Withdrawal Time: 0 hours 5 minutes 17 seconds  Total Procedure Duration: 0 hours 14 minutes 7 seconds  Estimated Blood Loss:  Estimated blood loss: none.      [facility]

## 2019-12-30 NOTE — Anesthesia Preprocedure Evaluation (Signed)
Anesthesia Evaluation  Patient identified by MRN, date of birth, ID band Patient awake    Reviewed: Allergy & Precautions, H&P , NPO status , reviewed documented beta blocker date and time   History of Anesthesia Complications Negative for: history of anesthetic complications  Airway Mallampati: III  TM Distance: >3 FB Neck ROM: limited   Comment: Large beard Dental  (+) Poor Dentition, Chipped, Missing, Dental Advidsory Given Very poor dentition, none loose:   Pulmonary shortness of breath, with exertion and Long-Term Oxygen Therapy, sleep apnea, Continuous Positive Airway Pressure Ventilation and Oxygen sleep apnea , pneumonia, resolved, COPD,  oxygen dependent, neg recent URI, former smoker,     + decreased breath sounds      Cardiovascular hypertension, (-) angina+CHF  (-) Past MI and (-) Cardiac Stents + dysrhythmias Atrial Fibrillation (-) Valvular Problems/Murmurs  03/2019 ECHO IMPRESSIONS    1. Left ventricular ejection fraction, by visual estimation, is 55 to  60%. The left ventricle has normal function. There is mildly increased  left ventricular hypertrophy.  2. Global right ventricle has normal systolic function.The right  ventricular size is normal. No increase in right ventricular wall  thickness.  3. Left atrial size was normal.  4. Right atrial size was normal.  5. The mitral valve is normal in structure. Mild mitral valve  regurgitation.  6. The tricuspid valve is normal in structure. Tricuspid valve  regurgitation is trivial.  7. The aortic valve is normal in structure. Aortic valve regurgitation is  not visualized.  8. The pulmonic valve was grossly normal. Pulmonic valve regurgitation is  not visualized.    Neuro/Psych neg Seizures PSYCHIATRIC DISORDERS Depression  Neuromuscular disease    GI/Hepatic PUD, GERD  Controlled,(+) Cirrhosis       , NAFLD   Endo/Other  diabetes, Type  2Hypothyroidism Morbid obesity  Renal/GU CRF and DialysisRenal diseaseDialyzed yesterday     Musculoskeletal  (+) Arthritis ,   Abdominal (+) + obese,   Peds  Hematology  (+) Blood dyscrasia, anemia ,   Anesthesia Other Findings Past Medical History: 06/21/2018: Acute on chronic diastolic CHF (congestive heart failure)  ([place]) 02/12/2015: Bell palsy 02/12/2015: Carpal tunnel syndrome No date: Dependence on nocturnal oxygen therapy     Comment:  2 LITERS WITH BIPAP No date: Depression No date: Gastric ulcer No date: GERD (gastroesophageal reflux disease) No date: Gout No date: History of chicken pox 03/04/2019: Multifocal pneumonia No date: Sleep apnea treated with nocturnal BiPAP  Past Surgical History: 2014 and 2015: CATARACT EXTRACTION; Bilateral 04/28/2011: CHOLECYSTECTOMY     Comment:  Laproscopic; Dr. Pat Patrick 05/24/2019: DIALYSIS/PERMA CATHETER INSERTION; N/A     Comment:  Procedure: DIALYSIS/PERMA CATHETER INSERTION;  Surgeon:               Algernon Huxley, MD;  Location: [place] [facility];                Service: Cardiovascular;  Laterality: N/A; 10/09/2019: DIALYSIS/PERMA CATHETER INSERTION; N/A     Comment:  Procedure: DIALYSIS/PERMA CATHETER INSERTION;  Surgeon:               Katha Cabal, MD;  Location: [place] [facility];               Service: Cardiovascular;  Laterality: N/A; No date: EYE SURGERY No date: GALLBLADDER SURGERY 2019: LASIK; Bilateral     Comment:  medical 2012: SHOULDER SURGERY; Right     Comment:  Dr. Leanor Kail  Reproductive/Obstetrics                             Anesthesia Physical  Anesthesia Plan  ASA: IV  Anesthesia Plan: General   Post-op Pain Management:  Regional for Post-op pain   Induction: Intravenous  PONV Risk Score and Plan: 2 and Treatment may vary due to age or medical condition, TIVA, Ondansetron and Propofol infusion  Airway Management Planned: Natural Airway and Simple Face  Mask  Additional Equipment: None  Intra-op Plan:   Post-operative Plan:   Informed Consent: I have reviewed the patients History and Physical, chart, labs and discussed the procedure including the risks, benefits and alternatives for the proposed anesthesia with the patient or authorized representative who has indicated his/her understanding and acceptance.     Dental Advisory Given  Plan Discussed with: CRNA  Anesthesia Plan Comments: (Patient's pulmonary status improved compared to yesterday. He is stable on nasal cannula. Hemoglobin 6.6 despite blood transfusions. Patient currently hemodynamically stable. Received dialysis yesterday. Evaluating risks v benefits, still reasonable to proceed with endoscopy to evaluate for cause of falling hemoglobin.  Discussed risks of anesthesia with patient, including possibility of difficulty with spontaneous ventilation under anesthesia necessitating airway intervention, PONV, and rare risks such as cardiac or respiratory or neurological events. Patient understands. Patient informed about increased incidence of above perioperative risk due to high BMI. Patient understands. )        Anesthesia Quick Evaluation

## 2019-12-30 NOTE — Anesthesia Postprocedure Evaluation (Signed)
Anesthesia Post Note  Patient: Patrick Hurst  Procedure(s) Performed: COLONOSCOPY WITH PROPOFOL (N/A )  Patient location during evaluation: Endoscopy Anesthesia Type: General Level of consciousness: awake and alert Pain management: pain level controlled Vital Signs Assessment: post-procedure vital signs reviewed and stable Respiratory status: spontaneous breathing, nonlabored ventilation, respiratory function stable and patient connected to nasal cannula oxygen Cardiovascular status: blood pressure returned to baseline and stable Postop Assessment: no apparent nausea or vomiting Anesthetic complications: no   No complications documented.   Last Vitals:  Vitals:   12/30/19 1658 12/30/19 1823  BP:    Pulse:    Resp:  (!) 21  Temp:    SpO2: 93% 94%    Last Pain:  Vitals:   12/30/19 1634  TempSrc: Oral  PainSc:                  [name]

## 2019-12-30 NOTE — Progress Notes (Signed)
Patrick Hurst  MRN: [id number]  DOB/AGE: 63-Mar-1957 63 y.o.  Primary Care Physician:Fisher, Kirstie Peri, MD  Admit date: 12/27/2019  Chief Complaint:  Chief Complaint  Patient presents with  . Shortness of Breath    S-Pt presented on  12/27/2019 with  Chief Complaint  Patient presents with  . Shortness of Breath  . Patient is lying comfortably in bed. Patient main complaint in today visit was I am feeling much better than yesterday.  Patient did inform me he is waiting for his colonoscopy to be done today at noon   Medications   . sodium chloride   Intravenous Once  . sodium chloride   Intravenous Once  . amiodarone  200 mg Oral Daily  . amLODipine  5 mg Oral Daily  . vitamin C  1,000 mg Oral BID  . atorvastatin  40 mg Oral Daily  . calcitRIOL  0.5 mcg Oral Daily  . Chlorhexidine Gluconate Cloth  6 each Topical Q0600  . febuxostat  80 mg Oral Daily  . furosemide  40 mg Intravenous Q12H  . insulin aspart  0-9 Units Subcutaneous TID PC & HS  . lactulose  20 g Oral Daily  . levothyroxine  100 mcg Oral QAC breakfast  . metoprolol tartrate  25 mg Oral BID  . pantoprazole (PROTONIX) IV  40 mg Intravenous Q12H  . polyethylene glycol-electrolytes  4,000 mL Oral Once  . sodium chloride flush  3 mL Intravenous Q12H         FYB:OFBPZ from the symptoms mentioned above,there are no other symptoms referable to all systems reviewed.  Physical Exam: Vital signs in last 24 hours: Temp:  [98.4 F (36.9 C)-99.2 F (37.3 C)] 98.7 F (37.1 C) (08/29 0813) Pulse Rate:  [70-104] 70 (08/29 0757) Resp:  [18-20] 20 (08/29 0813) BP: (107-156)/(41-88) 129/51 (08/29 0813) SpO2:  [90 %-98 %] 93 % (08/29 0902) Weight change:  Last BM Date: 12/28/19  Intake/Output from previous day: 08/28 0701 - 08/29 0700 In: 350 [Blood:350] Out: 2300  No intake/output data recorded.   Physical Exam: General- pt is awake , follows commands respond apparently Resp- No acute REsp distress,  decreased breath sound at bases CVS- S1S2 regular in rate and rhythm GIT- BS+, soft, NT, ND, PD catheter in situ, morbidly obese, distended EXT-1+ LE Edema, no cyanosis Access patient has tunneled catheter in situ  Lab Results: CBC Recent Labs    12/29/19 1035 12/30/19 0521  WBC 7.3 5.7  HGB 6.6* 8.1*  HCT 19.8* 23.6*  PLT 105* 104*    BMET Recent Labs    12/29/19 1035 12/30/19 0521  NA 139 133*  K 4.4 3.4*  CL 92* 91*  CO2 30 32  GLUCOSE 135* 105*  BUN 41* 26*  CREATININE 7.99* 6.15*  CALCIUM 7.1* 7.2*    MICRO Recent Results (from the past 240 hour(s))  SARS Coronavirus 2 by RT PCR (hospital order, performed in [facility] hospital lab) Nasopharyngeal Nasopharyngeal Swab     Status: None   Collection Time: 12/27/19 12:54 AM   Specimen: Nasopharyngeal Swab  Result Value Ref Range Status   SARS Coronavirus 2 NEGATIVE NEGATIVE Final    Comment: (NOTE) SARS-CoV-2 target nucleic acids are NOT DETECTED.  The SARS-CoV-2 RNA is generally detectable in upper and lower respiratory specimens during the acute phase of infection. The lowest concentration of SARS-CoV-2 viral copies this assay can detect is 250 copies / mL. A negative result does not preclude SARS-CoV-2 infection and should not  be used as the sole basis for treatment or other patient management decisions.  A negative result may occur with improper specimen collection / handling, submission of specimen other than nasopharyngeal swab, presence of viral mutation(s) within the areas targeted by this assay, and inadequate number of viral copies (<250 copies / mL). A negative result must be combined with clinical observations, patient history, and epidemiological information.  Fact Sheet for Patients:   StrictlyIdeas.no  Fact Sheet for Healthcare Providers: BankingDealers.co.za  This test is not yet approved or  cleared by the [place] FDA and has been  authorized for detection and/or diagnosis of SARS-CoV-2 by FDA under an Emergency Use Authorization (EUA).  This EUA will remain in effect (meaning this test can be used) for the duration of the COVID-19 declaration under Section 564(b)(1) of the Act, 21 U.S.C. section 360bbb-3(b)(1), unless the authorization is terminated or revoked sooner.  Performed at [facility], [address] [place], [place] [address]   Blood culture (routine x 2)     Status: None (Preliminary result)   Collection Time: 12/27/19  3:07 AM   Specimen: BLOOD  Result Value Ref Range Status   Specimen Description BLOOD RIGHT HAND  Final   Special Requests   Final    BOTTLES DRAWN AEROBIC AND ANAEROBIC Blood Culture adequate volume   Culture   Final    NO GROWTH 3 DAYS Performed at [facility], [address], [place], [place] [address]    Report Status PENDING  Incomplete  Blood culture (routine x 2)     Status: None (Preliminary result)   Collection Time: 12/27/19  3:07 AM   Specimen: BLOOD  Result Value Ref Range Status   Specimen Description BLOOD RIGHT [facility]  Final   Special Requests   Final    BOTTLES DRAWN AEROBIC AND ANAEROBIC Blood Culture adequate volume   Culture   Final    NO GROWTH 3 DAYS Performed at [facility], [address], [place], [place] [address]    Report Status PENDING  Incomplete  MRSA PCR Screening     Status: None   Collection Time: 12/27/19  3:45 PM   Specimen: Nasopharyngeal  Result Value Ref Range Status   MRSA by PCR NEGATIVE NEGATIVE Final    Comment:        The GeneXpert MRSA Assay (FDA approved for NASAL specimens only), is one component of a comprehensive MRSA colonization surveillance program. It is not intended to diagnose MRSA infection nor to guide or monitor treatment for MRSA infections. Performed at [facility], [address] [place], [place] [address]       Lab Results  Component Value Date   PTH 222 (H)  12/27/2019   CALCIUM 7.2 (L) 12/30/2019   CAION 0.97 (L) 11/08/2019   PHOS 4.6 12/30/2019               Impression:    64 y.o. male with past medical history of ESRD on PD with intermittent hemodialysis for volume overload, chronic diastolic heart failure, Bell's palsy, carpal tunnel syndrome, depression, GERD, gout, anemia of chronic kidney disease, secondary hyper parathyroidism, chronic respiratory failure who presents now with increasing shortness of breath, volume overload, acute respiratory failure, severe anemia.  [place] [facility] with use of intermittent hemodialysis CCPD 9 hours 5 exchanges   1)Renal end-stage renal disease Patient is on peritoneal dialysis with use of intermittent hemodialysis to help with fluid overload Patient is on CCPD  5.  Exchanges as an outpatient As inpatient patient is currently undergoing hemodialysis. Patient is in fluid overload especially if he is getting 2 units of blood today he is at risk of worsening Patient was last dialyzed yesterday   2)HTN Blood pressure is stable   3)Anemia of chronic disease and acute blood loss anemia  HGb is not at goal (9--11) Patient did receive PRBC yesterday Patient hemoglobin is now much better  4) secondary hyperparathyroidism -CKD Mineral-Bone Disorder   Secondary Hyperparathyroidism present .  Phosphorus is not at goal.   5) GI blood loss  Patient has underwent EGD yesterday Patient is to undergo colonoscopy today   6) electrolytes   sodium Hypo-natremic Secondary to ESRD  potassium Hypokalemic    7)Acid base Co2 at goal  8) acute respiratory failure Patient was in acute respiratory failure secondary to fluid overload Intermittent hemodialysis helped Patient is currently off BiPAP but is still dypneic  Now better   9) altered mental status Clinically better  Patient does have a sitter in the room This is being closely followed by the  primary team    Plan:  No need for renal replacement therapy today We will dialyze patient again in the morning     Jeanelle Dake s [facility] 12/30/2019, 10:37 AM

## 2019-12-30 NOTE — Transfer of Care (Signed)
Immediate Anesthesia Transfer of Care Note  Patient: Patrick Hurst  Procedure(s) Performed: COLONOSCOPY WITH PROPOFOL (N/A )  Patient Location: Endoscopy Unit  Anesthesia Type:General  Level of Consciousness: awake, alert  and oriented  Airway & Oxygen Therapy: Patient Spontanous Breathing and Patient connected to face mask oxygen  Post-op Assessment: Report given to RN and Post -op Vital signs reviewed and stable  Post vital signs: Reviewed and stable  Last Vitals:  Vitals Value Taken Time  BP    Temp    Pulse    Resp    SpO2      Last Pain:  Vitals:   12/30/19 1225  TempSrc:   PainSc: 0-No pain      Patients Stated Pain Goal: 0 ([id number])  Complications: No complications documented.

## 2019-12-30 NOTE — Progress Notes (Signed)
PROGRESS NOTE    Patrick Hurst  POE:[id number] DOB: 15-Oct-1955 DOA: 12/27/2019 PCP: [name], MD   Brief Narrative:  Patrick Hurst a63 y.o.Caucasian malewith a known history of end-stage renal disease on peritoneal dialysis, CHF, COPD, atrial fibrillation, depression, GERD and goutand obstructive sleep apnea on CPAP, who presented to the emergency room with acute onset of dyspnea with Altered mental status and decreased responsiveness. Patient had a blood glucose of 52 by EMS. He was given 200 mL of IV D10W without significant improvement in his mental status. He was then given 3 mg of IV Narcan and was more alert and conversant. He was better after he came to the ER however became somnolent again with sonorous respirations. He was found to be hypoxemic by EMS as well with pulse currently in the mid to upper 80s for which he was placed on nonrebreather and pulse currently was up to 93%. In the ER he received an ampule of D 50 as his blood glucose was again 61. He has been fully vaccinated for COVID-19.He denied any significant cough or wheezing. No nausea or vomiting or abdominal pain. No chest pain no palpitations.  Upon presentation to the emergency room, blood pressure was 95/47 pulse oximetry of 94% on 100% nonrebreather and otherwise normal vital signs.. He was ordered BiPAP. Labs revealed ABG with pH 7.13 and bicarbonate of 22. CMP showed a BUN of 99 and creatinine of 17.5 with a low glucose of 61 anion gap of 22 and CO2 25. Albumin was 3.2 total protein of 7.6. BNP was 319.9 lactic acid 0.8 and later 0.64 procalcitonin of 1.35. CBC showed significant anemia with hemoglobin of 5.7 hematocrit 17.9 compared to 8.5 and 24.3 about a month ago. INR is 1.3 and PT 15.7. Urine drug screen was positive for tricyclics. Blood cultures were drawn. Chest x-ray showed cardiomegaly with findings of CHF that is new compared to previous chest x-ray. There was no focal  consolidation.  Patient is admitted for acute encephalopathy which could be multifactorial,  Patient at home is on peritoneal dialysis presented with worsening renal functions, Nephrology consulted Patient underwent hemodialysis 3 days in a row,  feels much better,  going to have another hemodialysis session tomorrow.  Hemoglobin remains low,  GI consulted recommended GI work-up.  Patient underwent EGD which was unremarkable, underwent colonoscopy unremarkable, GI recommended outpatient capsule endoscopy.  Patient  has received 4 units PRBC.  Hb above 8.0 Acute encephalopathy has resolved.  Patient is more alert and awake.  Patient will continue BiPAP as needed and at night.  Assessment & Plan:   Active Problems:   Altered mental state   Acute on chronic anemia   Acute encephalopathy that is multifactorial >>>> Improved   This likely is secondary to hypoglycemia as well as hypoxemia in addition to uremia with end-stage renal disease on peritoneal dialysis. -The patient admitted to a progressive unit bed. -Nephrology consulted,  Patient underwent hemodialysis yesterday, feels better. -Hypoglycemia protocol will be followed. -Long-acting and high-dose insulin will be held off. -O2 protocol will be followed and BiPAP will be continued as needed especially with history of obstructive sleep apnea.  2. Acute on chronic diastolic CHFwith subsequent hypoxic and possibly hypercarbic acute respiratory failurecontributing to acute encephalopathy and worsening by his symptomatic anemia. - Continue BiPAP and monitor ABG as needed. -The patient will be gently diuresed with IV Lasix pending dialysis. -He had a recent 2D echo on 11/16/2019 that revealed an EF of 60 to 65% with  grade 2 diastolic dysfunction.   3.  Symptomatic anemia; Patient presented with a Hb of 5.7, he is going to get blood transfusion during hemodialysis. GI consulted, Patient underwent upper GI endoscopy, which was unremarkable.   Patient underwent colonoscopy which was unremarkable.  GI recommended outpatient capsule endoscopy.   3. Type II diabetes mellitus. -The patient will be placed on supplement coverage with NovoLog and will hold off U5 100 for now.  4. Hypertension. -Norvasc will be placed with holding parameters. -Elavil will be held off.  5. Atrial fibrillation. -We will continue amiodarone and will resume Eliquis given upper and lower GI endoscopy is unremarkable.  6. DVT prophylaxis. -SCDs. -Medical prophylaxis currently held off pending stool Hemoccult given symptomatic anemia   DVT prophylaxis: SCDs. Code Status: Full  Family Communication: Discussed with patient and wife at bedside. Disposition Plan:  Patient is from home Anticipated discharge home once medically clear. Patient is not medically clear due to anemia ongoing GI work-up.   Consultants:   Nephrology, gastroenterology,  Procedures: BiPAP, hemodialysis  Antimicrobials: Anti-infectives (From admission, onward)   Start     Dose/Rate Route Frequency Ordered Stop   12/30/19 0800  ceFEPIme (MAXIPIME) 2 g in sodium chloride 0.9 % 100 mL IVPB        2 g 200 mL/hr over 30 Minutes Intravenous Every 24 hours 12/29/19 1246     12/28/19 0500  ceFEPIme (MAXIPIME) 1 g in sodium chloride 0.9 % 100 mL IVPB  Status:  Discontinued        1 g 200 mL/hr over 30 Minutes Intravenous Every 24 hours 12/27/19 0531 12/29/19 1246   12/27/19 0529  vancomycin variable dose per unstable renal function (pharmacist dosing)  Status:  Discontinued         Does not apply See admin instructions 12/27/19 0529 12/28/19 1204   12/27/19 0300  vancomycin (VANCOREADY) IVPB 1250 mg/250 mL        1,250 mg 166.7 mL/hr over 90 Minutes Intravenous  Once 12/27/19 0256 12/27/19 0828   12/27/19 0245  vancomycin (VANCOCIN) IVPB 1000 mg/200 mL premix        1,000 mg 200 mL/hr over 60 Minutes Intravenous  Once 12/27/19 0241 12/27/19 0419   12/27/19 0245   ceFEPIme (MAXIPIME) 2 g in sodium chloride 0.9 % 100 mL IVPB        2 g 200 mL/hr over 30 Minutes Intravenous  Once 12/27/19 0241 12/27/19 0408      Subjective: Patient was seen and examined at bedside.  Overnight events noted, He appears much better, denies any shortness of breath.   Lying comfortably on the bed,  wife is at bedside explained in detail. All the questions answered. Objective: Vitals:   12/30/19 1225 12/30/19 1329 12/30/19 1330 12/30/19 1408  BP: (!) 151/54 (!) 110/97 (!) 112/98 134/70  Pulse: 64 (!) 105    Resp: 20   (!) 22  Temp: 97.6 F (36.4 C)   98.6 F (37 C)  TempSrc:    Oral  SpO2: 100% 93% 94% 92%  Weight:      Height:        Intake/Output Summary (Last 24 hours) at 12/30/2019 1426 Last data filed at 12/30/2019 0205 Gross per 24 hour  Intake 350 ml  Output 2300 ml  Net -1950 ml   Filed Weights   12/27/19 0051 12/27/19 2111  Weight: 135.6 kg (!) 144.6 kg    Examination:  General exam: Appears calm and comfortable  Respiratory system: Clear  to auscultation. Respiratory effort normal. Cardiovascular system: S1 & S2 heard, RRR. No JVD, murmurs, rubs, gallops or clicks. No pedal edema. Gastrointestinal system: Abdomen is nondistended, soft and nontender. No organomegaly or masses felt. Normal bowel sounds heard. Central nervous system: Alert and oriented. No focal neurological deficits. Extremities: Symmetric 5 x 5 power. Skin: No rashes, lesions or ulcers Psychiatry: Judgement and insight appear normal. Mood & affect appropriate.     Data Reviewed: I have personally reviewed following labs and imaging studies  CBC: Recent Labs  Lab 12/27/19 0054 12/27/19 0054 12/27/19 0426 12/27/19 1545 12/28/19 0953 12/28/19 1727 12/29/19 0036 12/29/19 1035 12/30/19 0521  WBC 9.4  --  10.7*  --  7.4  --   --  7.3 5.7  NEUTROABS 7.1  --   --   --   --   --   --   --   --   HGB 5.7*   < > 5.4*   < > 6.6* 6.9* 6.9* 6.6* 8.1*  HCT 17.9*   < > 17.0*   <  > 19.1* 20.7* 20.7* 19.8* 23.6*  MCV 96.2  --  96.0  --  90.1  --   --  93.0 90.4  PLT 109*  --  111*  --  117*  --   --  105* 104*   < > = values in this interval not displayed.   Basic Metabolic Panel: Recent Labs  Lab 12/27/19 0054 12/27/19 0625 12/28/19 0953 12/29/19 1035 12/30/19 0521  NA 140  --  135 139 133*  K 4.5  --  5.0 4.4 3.4*  CL 93*  --  92* 92* 91*  CO2 25  --  26 30 32  GLUCOSE 61*  --  161* 135* 105*  BUN 99*  --  57* 41* 26*  CREATININE 17.58*  --  10.52* 7.99* 6.15*  CALCIUM 6.7*  --  6.7* 7.1* 7.2*  MG  --   --  1.7 1.8 1.7  PHOS  --  15.1* 7.8* 5.7* 4.6   GFR: Estimated Creatinine Clearance: 18.1 mL/min (A) (by C-G formula based on SCr of 6.15 mg/dL (H)). Liver Function Tests: Recent Labs  Lab 12/27/19 0054  AST 15  ALT 18  ALKPHOS 114  BILITOT 0.9  PROT 7.6  ALBUMIN 3.2*   Recent Labs  Lab 12/27/19 0054  LIPASE 29   No results for input(s): AMMONIA in the last 168 hours. Coagulation Profile: Recent Labs  Lab 12/27/19 0054  INR 1.3*   Cardiac Enzymes: No results for input(s): CKTOTAL, CKMB, CKMBINDEX, TROPONINI in the last 168 hours. BNP (last 3 results) No results for input(s): PROBNP in the last 8760 hours. HbA1C: No results for input(s): HGBA1C in the last 72 hours. CBG: Recent Labs  Lab 12/29/19 1116 12/29/19 1835 12/29/19 2214 12/30/19 0810 12/30/19 1156  GLUCAP 125* 216* 118* 131* 121*   Lipid Profile: No results for input(s): CHOL, HDL, LDLCALC, TRIG, CHOLHDL, LDLDIRECT in the last 72 hours. Thyroid Function Tests: No results for input(s): TSH, T4TOTAL, FREET4, T3FREE, THYROIDAB in the last 72 hours. Anemia Panel: No results for input(s): VITAMINB12, FOLATE, FERRITIN, TIBC, IRON, RETICCTPCT in the last 72 hours. Sepsis Labs: Recent Labs  Lab 12/27/19 0054 12/27/19 0307  PROCALCITON 1.35  --   LATICACIDVEN 0.8 0.6    Recent Results (from the past 240 hour(s))  SARS Coronavirus 2 by RT PCR (hospital order,  performed in [facility] hospital lab) Nasopharyngeal Nasopharyngeal Swab  Status: None   Collection Time: 12/27/19 12:54 AM   Specimen: Nasopharyngeal Swab  Result Value Ref Range Status   SARS Coronavirus 2 NEGATIVE NEGATIVE Final    Comment: (NOTE) SARS-CoV-2 target nucleic acids are NOT DETECTED.  The SARS-CoV-2 RNA is generally detectable in upper and lower respiratory specimens during the acute phase of infection. The lowest concentration of SARS-CoV-2 viral copies this assay can detect is 250 copies / mL. A negative result does not preclude SARS-CoV-2 infection and should not be used as the sole basis for treatment or other patient management decisions.  A negative result may occur with improper specimen collection / handling, submission of specimen other than nasopharyngeal swab, presence of viral mutation(s) within the areas targeted by this assay, and inadequate number of viral copies (<250 copies / mL). A negative result must be combined with clinical observations, patient history, and epidemiological information.  Fact Sheet for Patients:   StrictlyIdeas.no  Fact Sheet for Healthcare Providers: BankingDealers.co.za  This test is not yet approved or  cleared by the [place] FDA and has been authorized for detection and/or diagnosis of SARS-CoV-2 by FDA under an Emergency Use Authorization (EUA).  This EUA will remain in effect (meaning this test can be used) for the duration of the COVID-19 declaration under Section 564(b)(1) of the Act, 21 U.S.C. section 360bbb-3(b)(1), unless the authorization is terminated or revoked sooner.  Performed at [facility], [address] [place], [place] [address]   Blood culture (routine x 2)     Status: None (Preliminary result)   Collection Time: 12/27/19  3:07 AM   Specimen: BLOOD  Result Value Ref Range Status   Specimen Description BLOOD RIGHT HAND  Final    Special Requests   Final    BOTTLES DRAWN AEROBIC AND ANAEROBIC Blood Culture adequate volume   Culture   Final    NO GROWTH 3 DAYS Performed at [facility], [address] [place], [place] [address]    Report Status PENDING  Incomplete  Blood culture (routine x 2)     Status: None (Preliminary result)   Collection Time: 12/27/19  3:07 AM   Specimen: BLOOD  Result Value Ref Range Status   Specimen Description BLOOD RIGHT [facility]  Final   Special Requests   Final    BOTTLES DRAWN AEROBIC AND ANAEROBIC Blood Culture adequate volume   Culture   Final    NO GROWTH 3 DAYS Performed at [facility], [address] [place], [place] [address]    Report Status PENDING  Incomplete  MRSA PCR Screening     Status: None   Collection Time: 12/27/19  3:45 PM   Specimen: Nasopharyngeal  Result Value Ref Range Status   MRSA by PCR NEGATIVE NEGATIVE Final    Comment:        The GeneXpert MRSA Assay (FDA approved for NASAL specimens only), is one component of a comprehensive MRSA colonization surveillance program. It is not intended to diagnose MRSA infection nor to guide or monitor treatment for MRSA infections. Performed at [facility] [address] [place], [place] [address]     Radiology Studies: No results found.  Scheduled Meds:  sodium chloride   Intravenous Once   sodium chloride   Intravenous Once   amiodarone  200 mg Oral Daily   amLODipine  5 mg Oral Daily   vitamin C  1,000 mg Oral BID   atorvastatin  40 mg Oral Daily   calcitRIOL  0.5 mcg Oral Daily   Chlorhexidine Gluconate Cloth  6 each Topical Q0600   febuxostat  80 mg Oral Daily   furosemide  40 mg Intravenous Q12H   insulin aspart  0-9 Units Subcutaneous TID PC & HS   lactulose  20 g Oral Daily   levothyroxine  100 mcg Oral QAC breakfast   metoprolol tartrate  25 mg Oral BID   pantoprazole (PROTONIX) IV  40 mg Intravenous Q12H   polyethylene glycol-electrolytes   4,000 mL Oral Once   sodium chloride flush  3 mL Intravenous Q12H   Continuous Infusions:  sodium chloride     sodium chloride     ceFEPime (MAXIPIME) IV 2 g (12/30/19 0857)   sodium chloride       LOS: 3 days    Time spent:  35 mins.    Shawna Clamp, MD [facility]   If 7PM-7AM, please contact night-coverage

## 2019-12-30 NOTE — Progress Notes (Signed)
Off the floor to endo.

## 2019-12-31 ENCOUNTER — Inpatient Hospital Stay: Payer: Commercial Managed Care - PPO

## 2019-12-31 ENCOUNTER — Telehealth: Payer: Self-pay

## 2019-12-31 ENCOUNTER — Encounter: Payer: Self-pay | Admitting: Gastroenterology

## 2019-12-31 DIAGNOSIS — D649 Anemia, unspecified: Secondary | ICD-10-CM

## 2019-12-31 DIAGNOSIS — Z992 Dependence on renal dialysis: Secondary | ICD-10-CM

## 2019-12-31 DIAGNOSIS — N186 End stage renal disease: Secondary | ICD-10-CM

## 2019-12-31 LAB — BASIC METABOLIC PANEL
Anion gap: 12 (ref 5–15)
BUN: 40 mg/dL — ABNORMAL HIGH (ref 8–23)
CO2: 28 mmol/L (ref 22–32)
Calcium: 7.2 mg/dL — ABNORMAL LOW (ref 8.9–10.3)
Chloride: 97 mmol/L — ABNORMAL LOW (ref 98–111)
Creatinine, Ser: 8.25 mg/dL — ABNORMAL HIGH (ref 0.61–1.24)
GFR calc Af Amer: 7 mL/min — ABNORMAL LOW (ref >60)
GFR calc non Af Amer: 6 mL/min — ABNORMAL LOW (ref >60)
Glucose, Bld: 142 mg/dL — ABNORMAL HIGH (ref 70–99)
Potassium: 3.9 mmol/L (ref 3.5–5.1)
Sodium: 137 mmol/L (ref 135–145)

## 2019-12-31 LAB — CBC
HCT: 23.2 % — ABNORMAL LOW (ref 39.0–52.0)
Hemoglobin: 7.7 g/dL — ABNORMAL LOW (ref 13.0–17.0)
MCH: 31 pg (ref 26.0–34.0)
MCHC: 33.2 g/dL (ref 30.0–36.0)
MCV: 93.5 fL (ref 80.0–100.0)
Platelets: 112 10*3/uL — ABNORMAL LOW (ref 150–400)
RBC: 2.48 MIL/uL — ABNORMAL LOW (ref 4.22–5.81)
RDW: 15.4 % (ref 11.5–15.5)
WBC: 5.6 10*3/uL (ref 4.0–10.5)
nRBC: 0.7 % — ABNORMAL HIGH (ref 0.0–0.2)

## 2019-12-31 LAB — GLUCOSE, CAPILLARY
Glucose-Capillary: 139 mg/dL — ABNORMAL HIGH (ref 70–99)
Glucose-Capillary: 236 mg/dL — ABNORMAL HIGH (ref 70–99)
Glucose-Capillary: 216 mg/dL — ABNORMAL HIGH (ref 70–99)

## 2019-12-31 LAB — PHOSPHORUS: Phosphorus: 6.3 mg/dL — ABNORMAL HIGH (ref 2.5–4.6)

## 2019-12-31 LAB — MAGNESIUM: Magnesium: 2.1 mg/dL (ref 1.7–2.4)

## 2019-12-31 NOTE — Plan of Care (Signed)
VSS throughout shift, no falls this shift. Patient had HD today, Immature AV fistula to left arm (saving). Discussed POC with patient and wife. Patient ambulating in room independently.  Problem: Education: Goal: Knowledge of General Education information will improve Description: Including pain rating scale, medication(s)/side effects and non-pharmacologic comfort measures Outcome: Progressing   Problem: Health Behavior/Discharge Planning: Goal: Ability to manage health-related needs will improve Outcome: Progressing   Problem: Clinical Measurements: Goal: Ability to maintain clinical measurements within normal limits will improve Outcome: Progressing Goal: Diagnostic test results will improve Outcome: Progressing Goal: Respiratory complications will improve Outcome: Progressing Goal: Cardiovascular complication will be avoided Outcome: Progressing   Problem: Activity: Goal: Risk for activity intolerance will decrease Outcome: Progressing   Problem: Nutrition: Goal: Adequate nutrition will be maintained Outcome: Progressing   Problem: Coping: Goal: Level of anxiety will decrease Outcome: Progressing

## 2019-12-31 NOTE — Progress Notes (Signed)
[facility]  ROUNDING NOTE   Subjective:  Patient seen in dialysis today, awake, alert, tolerating dialysis treatment well.Denies SOB, on 3L of O2 via nasal canula.   Objective:  Vital signs in last 24 hours:  Temp:  [98.1 F (36.7 C)-98.9 F (37.2 C)] 98.3 F (36.8 C) (08/30 0800) Pulse Rate:  [61-76] 62 (08/30 0800) Resp:  [16-21] 19 (08/30 0800) BP: (126-159)/(51-73) 136/51 (08/30 0800) SpO2:  [93 %-100 %] 95 % (08/30 0800)  Weight change:  Filed Weights   12/27/19 0051 12/27/19 2111  Weight: 135.6 kg (!) 144.6 kg    Intake/Output: I/O last 3 completed shifts: In: 340 [P.O.:240; IV Piggyback:100] Out: 0    Intake/Output this shift:  Total I/O In: 114.8 [IV Piggyback:114.8] Out: -   Physical Exam: General: No acute distress, pleasant  Head: Normocephalic, atraumatic. Moist oral mucosal membranes  Eyes: Sclerae and conjunctivae clear  Neck: Supple, trachea at midline  Lungs:  Normal and symmetrical respiratory effort, Lungs diminished at the bases  Heart: S1S2, no rub or gallop  Abdomen:  Nontender,PD catheter in place with dressing CDI  Extremities:  2+ Bilateral lower extremity edema.  Neurologic: Alert, oriented x 3  Skin: No scars, lesions or rashes noted  Access: Rt IJ catheter    Basic Metabolic Panel: Recent Labs  Lab 12/27/19 0054 12/27/19 0054 12/27/19 0625 12/28/19 0953 12/28/19 0953 12/29/19 1035 12/30/19 0521 12/31/19 0553  NA 140  --   --  135  --  139 133* 137  K 4.5  --   --  5.0  --  4.4 3.4* 3.9  CL 93*  --   --  92*  --  92* 91* 97*  CO2 25  --   --  26  --  30 32 28  GLUCOSE 61*  --   --  161*  --  135* 105* 142*  BUN 99*  --   --  57*  --  41* 26* 40*  CREATININE 17.58*  --   --  10.52*  --  7.99* 6.15* 8.25*  CALCIUM 6.7*   < >  --  6.7*   < > 7.1* 7.2* 7.2*  MG  --   --   --  1.7  --  1.8 1.7 2.1  PHOS  --   --  15.1* 7.8*  --  5.7* 4.6 6.3*   < > = values in this interval not displayed.    Liver Function  Tests: Recent Labs  Lab 12/27/19 0054  AST 15  ALT 18  ALKPHOS 114  BILITOT 0.9  PROT 7.6  ALBUMIN 3.2*   Recent Labs  Lab 12/27/19 0054  LIPASE 29   No results for input(s): AMMONIA in the last 168 hours.  CBC: Recent Labs  Lab 12/27/19 0054 12/27/19 0054 12/27/19 0426 12/27/19 1545 12/28/19 0953 12/28/19 0953 12/28/19 1727 12/29/19 0036 12/29/19 1035 12/30/19 0521 12/31/19 0553  WBC 9.4   < > 10.7*  --  7.4  --   --   --  7.3 5.7 5.6  NEUTROABS 7.1  --   --   --   --   --   --   --   --   --   --   HGB 5.7*   < > 5.4*   < > 6.6*   < > 6.9* 6.9* 6.6* 8.1* 7.7*  HCT 17.9*   < > 17.0*   < > 19.1*   < > 20.7* 20.7* 19.8*  23.6* 23.2*  MCV 96.2   < > 96.0  --  90.1  --   --   --  93.0 90.4 93.5  PLT 109*   < > 111*  --  117*  --   --   --  105* 104* 112*   < > = values in this interval not displayed.    Cardiac Enzymes: No results for input(s): CKTOTAL, CKMB, CKMBINDEX, TROPONINI in the last 168 hours.  BNP: Invalid input(s): POCBNP  CBG: Recent Labs  Lab 12/30/19 0810 12/30/19 1156 12/30/19 1648 12/30/19 2036 12/31/19 0800  GLUCAP 131* 121* 191* 159* 139*    Microbiology: Results for orders placed or performed during the hospital encounter of 12/27/19  SARS Coronavirus 2 by RT PCR (hospital order, performed in [facility] hospital lab) Nasopharyngeal Nasopharyngeal Swab     Status: None   Collection Time: 12/27/19 12:54 AM   Specimen: Nasopharyngeal Swab  Result Value Ref Range Status   SARS Coronavirus 2 NEGATIVE NEGATIVE Final    Comment: (NOTE) SARS-CoV-2 target nucleic acids are NOT DETECTED.  The SARS-CoV-2 RNA is generally detectable in upper and lower respiratory specimens during the acute phase of infection. The lowest concentration of SARS-CoV-2 viral copies this assay can detect is 250 copies / mL. A negative result does not preclude SARS-CoV-2 infection and should not be used as the sole basis for treatment or other patient management  decisions.  A negative result may occur with improper specimen collection / handling, submission of specimen other than nasopharyngeal swab, presence of viral mutation(s) within the areas targeted by this assay, and inadequate number of viral copies (<250 copies / mL). A negative result must be combined with clinical observations, patient history, and epidemiological information.  Fact Sheet for Patients:   StrictlyIdeas.no  Fact Sheet for Healthcare Providers: BankingDealers.co.za  This test is not yet approved or  cleared by the [place] FDA and has been authorized for detection and/or diagnosis of SARS-CoV-2 by FDA under an Emergency Use Authorization (EUA).  This EUA will remain in effect (meaning this test can be used) for the duration of the COVID-19 declaration under Section 564(b)(1) of the Act, 21 U.S.C. section 360bbb-3(b)(1), unless the authorization is terminated or revoked sooner.  Performed at [facility], [address] [place], [place] [address]   Blood culture (routine x 2)     Status: None (Preliminary result)   Collection Time: 12/27/19  3:07 AM   Specimen: BLOOD  Result Value Ref Range Status   Specimen Description BLOOD RIGHT HAND  Final   Special Requests   Final    BOTTLES DRAWN AEROBIC AND ANAEROBIC Blood Culture adequate volume   Culture   Final    NO GROWTH 4 DAYS Performed at [facility], [address] [place], [place] [address]    Report Status PENDING  Incomplete  Blood culture (routine x 2)     Status: None (Preliminary result)   Collection Time: 12/27/19  3:07 AM   Specimen: BLOOD  Result Value Ref Range Status   Specimen Description BLOOD RIGHT [facility]  Final   Special Requests   Final    BOTTLES DRAWN AEROBIC AND ANAEROBIC Blood Culture adequate volume   Culture   Final    NO GROWTH 4 DAYS Performed at [facility], [address], [place], [place] [address]     Report Status PENDING  Incomplete  MRSA PCR Screening     Status: None   Collection Time:  12/27/19  3:45 PM   Specimen: Nasopharyngeal  Result Value Ref Range Status   MRSA by PCR NEGATIVE NEGATIVE Final    Comment:        The GeneXpert MRSA Assay (FDA approved for NASAL specimens only), is one component of a comprehensive MRSA colonization surveillance program. It is not intended to diagnose MRSA infection nor to guide or monitor treatment for MRSA infections. Performed at [facility], [address] [place], [place] [address]     Coagulation Studies: No results for input(s): LABPROT, INR in the last 72 hours.  Urinalysis: No results for input(s): COLORURINE, LABSPEC, PHURINE, GLUCOSEU, HGBUR, BILIRUBINUR, KETONESUR, PROTEINUR, UROBILINOGEN, NITRITE, LEUKOCYTESUR in the last 72 hours.  Invalid input(s): APPERANCEUR    Imaging: No results found.   Medications:    sodium chloride     sodium chloride     ceFEPime (MAXIPIME) IV 2 g (12/31/19 0853)   sodium chloride      sodium chloride   Intravenous Once   sodium chloride   Intravenous Once   amiodarone  200 mg Oral Daily   amLODipine  5 mg Oral Daily   apixaban  2.5 mg Oral BID   vitamin C  1,000 mg Oral BID   atorvastatin  40 mg Oral Daily   calcitRIOL  0.5 mcg Oral Daily   Chlorhexidine Gluconate Cloth  6 each Topical Q0600   febuxostat  80 mg Oral Daily   furosemide  40 mg Intravenous Q12H   insulin aspart  0-9 Units Subcutaneous TID PC & HS   lactulose  20 g Oral Daily   levothyroxine  100 mcg Oral QAC breakfast   metoprolol tartrate  25 mg Oral BID   pantoprazole (PROTONIX) IV  40 mg Intravenous Q12H   polyethylene glycol-electrolytes  4,000 mL Oral Once   sodium chloride flush  3 mL Intravenous Q12H   sodium chloride, acetaminophen, alum & mag hydroxide-simeth, ipratropium-albuterol, sodium chloride flush  Assessment/ Plan:  [name] is a 64 y.o.  Caucasian male with a known history of end-stage renal disease on peritoneal dialysis, CHF, COPD, atrial fibrillation, depression, GERD and goutand obstructive sleep apnea on CPAP, who presented to the emergency room with acute onset of dyspnea withAltered mentalstatus and decreased responsiveness.He  Was in acute respiratory failure and had severe anemia.Received multiple blood transfusions and dialysis to prevent overload.  [place] [facility] with use of intermittent hemodialysis  # ESRD  Patient is on peritoneal dialysis with use of intermittent hemodialysis to help with fluid overload Patient is on CCPD 5.  Exchanges as an outpatient Patient is getting dialysis treatment today, tolerating well.  #Anemia  Hgb today is 7.7 after receiving 5 units of PRBC during this admission.Patient underwent EGD and colonoscopy which was unremarkable, GI recommended outpatient capsule endoscopy.  #Secondary hyperparathyroidism -CKD Mineral-Bone Disorder  PTH 222 ON 12/27/19   Patient was seen and examined with Crosby Oyster, DNP. Additionally will monitor patient for dialysis need. Resume peritoneal dialysis on discharge. Above plan was discussed and agreed upon on the signing of this note.   Patrick Hurst , [facility]  8/30/20214:44 PM     LOS: 4 Patrick Hurst 8/30/20213:32 PM

## 2019-12-31 NOTE — Care Management Important Message (Signed)
Important Message  Patient Details  Name: Patrick Hurst MRN: [id number] Date of Birth: 1956-02-14   Medicare Important Message Given:  Yes     Dannette Barbara 12/31/2019, 1:29 PM

## 2019-12-31 NOTE — Progress Notes (Addendum)
PROGRESS NOTE    Patrick Hurst  VEL:[id number] DOB: [birth year] DOA: 12/27/2019 PCP: [name], MD   Brief Narrative:  Patrick Hurst a63 y.o.Caucasian malewith a known history of end-stage renal disease on peritoneal dialysis, CHF, COPD, atrial fibrillation, depression, GERD and goutand obstructive sleep apnea on CPAP, who presented to the emergency room with acute onset of dyspnea with Altered mental status and decreased responsiveness. Patient had a blood glucose of 52 by EMS. He was given 200 mL of IV D10W without significant improvement in his mental status. He was then given 3 mg of IV Narcan and was more alert and conversant. He was better after he came to the ER however became somnolent again with sonorous respirations. He was found to be hypoxemic by EMS as well with pulse currently in the mid to upper 80s for which he was placed on nonrebreather and pulse currently was up to 93%. In the ER he received an ampule of D 50 as his blood glucose was again 61. He has been fully vaccinated for COVID-19.He denied any significant cough or wheezing. No nausea or vomiting or abdominal pain. No chest pain no palpitations.  Upon presentation to the emergency room, blood pressure was 95/47 pulse oximetry of 94% on 100% nonrebreather and otherwise normal vital signs.. He was ordered BiPAP. Labs revealed ABG with pH 7.13 and bicarbonate of 22. CMP showed a BUN of 99 and creatinine of 17.5 with a low glucose of 61 anion gap of 22 and CO2 25. Albumin was 3.2 total protein of 7.6. BNP was 319.9 lactic acid 0.8 and later 0.64 procalcitonin of 1.35. CBC showed significant anemia with hemoglobin of 5.7 hematocrit 17.9 compared to 8.5 and 24.3 about a month ago. INR is 1.3 and PT 15.7. Urine drug screen was positive for tricyclics. Blood cultures were drawn. Chest x-ray showed cardiomegaly with findings of CHF that is new compared to previous chest x-ray. There was no focal  consolidation.  Patient is admitted for acute encephalopathy which could be multifactorial,  Patient at home is on peritoneal dialysis presented with worsening renal functions, serum creatinine on admission 17.89 Nephrology consulted,  Patient underwent hemodialysis 3 days in a row,  feels much better,  going to have another hemodialysis session today.  Hemoglobin remains low,  GI consulted recommended GI work-up.  Patient underwent EGD and colonoscopy which was unremarkable, GI recommended outpatient capsule endoscopy.  Patient  has received 5 units PRBC  During this hospitalization.  Hb above 8.0.  Acute encephalopathy has resolved.  Patient is more alert and awake.  Patient will continue BiPAP as needed and at night.  Assessment & Plan:   Active Problems:   Altered mental state   Acute on chronic anemia   Acute encephalopathy that is multifactorial >>>> Improved   This likely is secondary to hypoglycemia as well as hypoxemia in addition to uremia with end-stage renal disease on peritoneal dialysis. -The patient admitted to a progressive unit bed. -Nephrology consulted,  Patient underwent hemodialysis  Three days in a row.  -Hypoglycemia protocol will be followed. -Long-acting and high-dose insulin will be held off. -O2 protocol will be followed and BiPAP will be continued as needed especially with history of obstructive sleep apnea. - There was no evidence of sepsis, it was ruled out.  2. Acute on chronic diastolic CHFwith subsequent hypoxic and possibly hypercarbic acute respiratory failurecontributing to acute encephalopathy and worsening by his symptomatic anemia. - Continue BiPAP and monitor ABG as needed. -The patient will  be gently diuresed with IV Lasix pending dialysis. -He had a recent 2D echo on 11/16/2019 that revealed an EF of 60 to 65% with grade 2 diastolic dysfunction.  3.  Symptomatic anemia; Patient presented with a Hb of 5.7, he is going to get blood transfusion  during hemodialysis. GI consulted, Patient underwent upper GI endoscopy, which was unremarkable.  Patient underwent colonoscopy which was unremarkable.  GI recommended outpatient capsule endoscopy. Hb remains stable above 8.0  Hematology consulted, recommended CT Abdomen to r/o retro- peritoneal bleeding .   3. Type II diabetes mellitus. -The patient will be placed on supplement coverage with NovoLog and will hold off U5 100 for now.  4. Hypertension. -Norvasc will be placed with holding parameters. -Elavil will be held off.  5. Atrial fibrillation. -We will continue amiodarone and will resume Eliquis given upper and lower GI endoscopy is unremarkable.  6. DVT prophylaxis. -SCDs. -Medical prophylaxis currently held off pending stool Hemoccult given symptomatic anemia   DVT prophylaxis: SCDs. Code Status: Full  Family Communication: Discussed with patient and wife at bedside. Disposition Plan:  Patient is from home Anticipated discharge home once medically clear. Patient is not medically clear due to anemia ongoing GI work-up.   Consultants:   Nephrology, gastroenterology,  Procedures: BiPAP, hemodialysis  Antimicrobials: Anti-infectives (From admission, onward)   Start     Dose/Rate Route Frequency Ordered Stop   12/30/19 0800  ceFEPIme (MAXIPIME) 2 g in sodium chloride 0.9 % 100 mL IVPB        2 g 200 mL/hr over 30 Minutes Intravenous Every 24 hours 12/29/19 1246     12/28/19 0500  ceFEPIme (MAXIPIME) 1 g in sodium chloride 0.9 % 100 mL IVPB  Status:  Discontinued        1 g 200 mL/hr over 30 Minutes Intravenous Every 24 hours 12/27/19 0531 12/29/19 1246   12/27/19 0529  vancomycin variable dose per unstable renal function (pharmacist dosing)  Status:  Discontinued         Does not apply See admin instructions 12/27/19 0529 12/28/19 1204   12/27/19 0300  vancomycin (VANCOREADY) IVPB 1250 mg/250 mL        1,250 mg 166.7 mL/hr over 90 Minutes Intravenous  Once  12/27/19 0256 12/27/19 0828   12/27/19 0245  vancomycin (VANCOCIN) IVPB 1000 mg/200 mL premix        1,000 mg 200 mL/hr over 60 Minutes Intravenous  Once 12/27/19 0241 12/27/19 0419   12/27/19 0245  ceFEPIme (MAXIPIME) 2 g in sodium chloride 0.9 % 100 mL IVPB        2 g 200 mL/hr over 30 Minutes Intravenous  Once 12/27/19 0241 12/27/19 0408      Subjective: Patient was seen and examined at bedside.  Overnight events noted, He appears much better, denies any shortness of breath.   sitting comfortably on  Chair ,  wife is at bedside explained in detail. All the questions answered.  Objective: Vitals:   12/30/19 1823 12/30/19 2008 12/31/19 0427 12/31/19 0800  BP:  128/73 126/64 (!) 136/51  Pulse:  76 61 62  Resp: (!) 21 16 18 19   Temp:  98.1 F (36.7 C) 98.5 F (36.9 C) 98.3 F (36.8 C)  TempSrc:  Oral Oral Axillary  SpO2: 94% 100% 97% 95%  Weight:      Height:        Intake/Output Summary (Last 24 hours) at 12/31/2019 1511 Last data filed at 12/31/2019 1400 Gross per 24 hour  Intake 454.78 ml  Output --  Net 454.78 ml   Filed Weights   12/27/19 0051 12/27/19 2111  Weight: 135.6 kg (!) 144.6 kg    Examination:  General exam: Appears calm and comfortable  Respiratory system: Clear to auscultation. Respiratory effort normal. Cardiovascular system: S1 & S2 heard, RRR. No JVD, murmurs, rubs, gallops or clicks. No pedal edema. Gastrointestinal system: Abdomen is nondistended, soft and nontender. No organomegaly or masses felt. Normal bowel sounds heard. Central nervous system: Alert and oriented. No focal neurological deficits. Extremities: Symmetric 5 x 5 power. Skin: No rashes, lesions or ulcers Psychiatry: Judgement and insight appear normal. Mood & affect appropriate.     Data Reviewed: I have personally reviewed following labs and imaging studies  CBC: Recent Labs  Lab 12/27/19 0054 12/27/19 0054 12/27/19 0426 12/27/19 1545 12/28/19 0953 12/28/19 0953  12/28/19 1727 12/29/19 0036 12/29/19 1035 12/30/19 0521 12/31/19 0553  WBC 9.4   < > 10.7*  --  7.4  --   --   --  7.3 5.7 5.6  NEUTROABS 7.1  --   --   --   --   --   --   --   --   --   --   HGB 5.7*   < > 5.4*   < > 6.6*   < > 6.9* 6.9* 6.6* 8.1* 7.7*  HCT 17.9*   < > 17.0*   < > 19.1*   < > 20.7* 20.7* 19.8* 23.6* 23.2*  MCV 96.2   < > 96.0  --  90.1  --   --   --  93.0 90.4 93.5  PLT 109*   < > 111*  --  117*  --   --   --  105* 104* 112*   < > = values in this interval not displayed.   Basic Metabolic Panel: Recent Labs  Lab 12/27/19 0054 12/27/19 0625 12/28/19 0953 12/29/19 1035 12/30/19 0521 12/31/19 0553  NA 140  --  135 139 133* 137  K 4.5  --  5.0 4.4 3.4* 3.9  CL 93*  --  92* 92* 91* 97*  CO2 25  --  26 30 32 28  GLUCOSE 61*  --  161* 135* 105* 142*  BUN 99*  --  57* 41* 26* 40*  CREATININE 17.58*  --  10.52* 7.99* 6.15* 8.25*  CALCIUM 6.7*  --  6.7* 7.1* 7.2* 7.2*  MG  --   --  1.7 1.8 1.7 2.1  PHOS  --  15.1* 7.8* 5.7* 4.6 6.3*   GFR: Estimated Creatinine Clearance: 13.5 mL/min (A) (by C-G formula based on SCr of 8.25 mg/dL (H)). Liver Function Tests: Recent Labs  Lab 12/27/19 0054  AST 15  ALT 18  ALKPHOS 114  BILITOT 0.9  PROT 7.6  ALBUMIN 3.2*   Recent Labs  Lab 12/27/19 0054  LIPASE 29   No results for input(s): AMMONIA in the last 168 hours. Coagulation Profile: Recent Labs  Lab 12/27/19 0054  INR 1.3*   Cardiac Enzymes: No results for input(s): CKTOTAL, CKMB, CKMBINDEX, TROPONINI in the last 168 hours. BNP (last 3 results) No results for input(s): PROBNP in the last 8760 hours. HbA1C: No results for input(s): HGBA1C in the last 72 hours. CBG: Recent Labs  Lab 12/30/19 0810 12/30/19 1156 12/30/19 1648 12/30/19 2036 12/31/19 0800  GLUCAP 131* 121* 191* 159* 139*   Lipid Profile: No results for input(s): CHOL, HDL, LDLCALC, TRIG, CHOLHDL, LDLDIRECT in the  last 72 hours. Thyroid Function Tests: No results for input(s): TSH,  T4TOTAL, FREET4, T3FREE, THYROIDAB in the last 72 hours. Anemia Panel: No results for input(s): VITAMINB12, FOLATE, FERRITIN, TIBC, IRON, RETICCTPCT in the last 72 hours. Sepsis Labs: Recent Labs  Lab 12/27/19 0054 12/27/19 0307  PROCALCITON 1.35  --   LATICACIDVEN 0.8 0.6    Recent Results (from the past 240 hour(s))  SARS Coronavirus 2 by RT PCR (hospital order, performed in [facility] hospital lab) Nasopharyngeal Nasopharyngeal Swab     Status: None   Collection Time: 12/27/19 12:54 AM   Specimen: Nasopharyngeal Swab  Result Value Ref Range Status   SARS Coronavirus 2 NEGATIVE NEGATIVE Final    Comment: (NOTE) SARS-CoV-2 target nucleic acids are NOT DETECTED.  The SARS-CoV-2 RNA is generally detectable in upper and lower respiratory specimens during the acute phase of infection. The lowest concentration of SARS-CoV-2 viral copies this assay can detect is 250 copies / mL. A negative result does not preclude SARS-CoV-2 infection and should not be used as the sole basis for treatment or other patient management decisions.  A negative result may occur with improper specimen collection / handling, submission of specimen other than nasopharyngeal swab, presence of viral mutation(s) within the areas targeted by this assay, and inadequate number of viral copies (<250 copies / mL). A negative result must be combined with clinical observations, patient history, and epidemiological information.  Fact Sheet for Patients:   StrictlyIdeas.no  Fact Sheet for Healthcare Providers: BankingDealers.co.za  This test is not yet approved or  cleared by the [place] FDA and has been authorized for detection and/or diagnosis of SARS-CoV-2 by FDA under an Emergency Use Authorization (EUA).  This EUA will remain in effect (meaning this test can be used) for the duration of the COVID-19 declaration under Section 564(b)(1) of the Act, 21  U.S.C. section 360bbb-3(b)(1), unless the authorization is terminated or revoked sooner.  Performed at [facility], [address] [place], [place] [address]   Blood culture (routine x 2)     Status: None (Preliminary result)   Collection Time: 12/27/19  3:07 AM   Specimen: BLOOD  Result Value Ref Range Status   Specimen Description BLOOD RIGHT HAND  Final   Special Requests   Final    BOTTLES DRAWN AEROBIC AND ANAEROBIC Blood Culture adequate volume   Culture   Final    NO GROWTH 4 DAYS Performed at [facility], [address], [place], [place] [address]    Report Status PENDING  Incomplete  Blood culture (routine x 2)     Status: None (Preliminary result)   Collection Time: 12/27/19  3:07 AM   Specimen: BLOOD  Result Value Ref Range Status   Specimen Description BLOOD RIGHT [facility]  Final   Special Requests   Final    BOTTLES DRAWN AEROBIC AND ANAEROBIC Blood Culture adequate volume   Culture   Final    NO GROWTH 4 DAYS Performed at [facility], [address], [place],  [address]    Report Status PENDING  Incomplete  MRSA PCR Screening     Status: None   Collection Time: 12/27/19  3:45 PM   Specimen: Nasopharyngeal  Result Value Ref Range Status   MRSA by PCR NEGATIVE NEGATIVE Final    Comment:        The GeneXpert MRSA Assay (FDA approved for NASAL specimens only), is one component of a comprehensive MRSA colonization surveillance program. It is not  intended to diagnose MRSA infection nor to guide or monitor treatment for MRSA infections. Performed at [facility], [address], [place], [place] [address]     Radiology Studies: No results found.  Scheduled Meds: . sodium chloride   Intravenous Once  . sodium chloride   Intravenous Once  . amiodarone  200 mg Oral Daily  . amLODipine  5 mg Oral Daily  . apixaban  2.5 mg Oral BID  . vitamin C  1,000 mg Oral BID  . atorvastatin  40 mg Oral Daily  . calcitRIOL   0.5 mcg Oral Daily  . Chlorhexidine Gluconate Cloth  6 each Topical Q0600  . febuxostat  80 mg Oral Daily  . furosemide  40 mg Intravenous Q12H  . insulin aspart  0-9 Units Subcutaneous TID PC & HS  . lactulose  20 g Oral Daily  . levothyroxine  100 mcg Oral QAC breakfast  . metoprolol tartrate  25 mg Oral BID  . pantoprazole (PROTONIX) IV  40 mg Intravenous Q12H  . polyethylene glycol-electrolytes  4,000 mL Oral Once  . sodium chloride flush  3 mL Intravenous Q12H   Continuous Infusions: . sodium chloride    . sodium chloride    . ceFEPime (MAXIPIME) IV 2 g (12/31/19 0853)  . sodium chloride       LOS: 4 days    Time spent:  35 mins.    Shawna Clamp, MD [facility]   If 7PM-7AM, please contact night-coverage

## 2019-12-31 NOTE — Assessment & Plan Note (Addendum)
#  64 year old male patient with multiple medical problems including end-stage renal disease on dialysis-is currently admitted to hospital for mental status changes/secondary uremia; also noted to have severe anemia/on chronic anemia.  #Acute on chronic anemia/secondary to ESRD-5.3 nadir hemoglobin; s/p 5 units of PRBC transfusion-today hemoglobin 8.0 EGD /colonoscopy negative for acute blood loss.  CT scan negative for any acute retroperitoneal bleed.  Awaiting hemolysis work-up.  LDH- Slightly elevated haptoglobin pending.  Recommend continued iron/EPO; increase the dose of 40,000 units weekly.  Discussed with Dr. Juleen China.  # End-stage renal disease on dialysis-peritoneal.   #Discussed with patient and his wife Patrick Hurst-the above plan of care.  In agreement.  We will make arrangements for follow-up next week.  Discussed with Dr. Dwyane Dee.

## 2019-12-31 NOTE — Telephone Encounter (Signed)
Patient is currently in the hospital. Patient wife states she will inform him and he will give [place] a call when he gets out of the hospital

## 2019-12-31 NOTE — Consult Note (Signed)
[place] NOTE  Patient Care Team: [name], MD as PCP - General (Family Medicine) Lucilla Lame, MD as Consulting Physician (Gastroenterology) Murlean Iba, MD (Nephrology) Alisa Graff, FNP (Cardiology) Yolonda Kida, MD as Consulting Physician (Cardiology) Chesley Mires, MD as Consulting Physician (Pulmonary Disease)  CHIEF COMPLAINTS/PURPOSE OF CONSULTATION: Severe anemia  HISTORY OF PRESENTING ILLNESS:  Patrick Hurst 64 y.o.  male history of poorly controlled diabetes; end-stage renal disease on dialysis; CHF COPD A. fib obstructive sleep apnea on CPAP is currently admitted to hospital for acute dyspnea with mental status changes.  Patient diagnosed with acute on chronic congestive heart failure; and also mental status changes secondary to low blood sugars.  Patient also noted to be uremic on presentation.  Patient is currently on hemodialysis in the hospital.  With regards to anemia, patient hemoglobin on admission noted to be 5.3.  Patient needed 5 units of PRBC transfusion; currently hemoglobin is 7.7.  Patient appropriately underwent EGD/colonoscopy no acute blood loss noted.  Hematology has been consulted for further evaluation of the patient.   Review of Systems  Constitutional: Positive for malaise/fatigue and weight loss. Negative for chills, diaphoresis and fever.  HENT: Negative for nosebleeds and sore throat.   Eyes: Negative for double vision.  Respiratory: Positive for shortness of breath. Negative for cough, hemoptysis, sputum production and wheezing.   Cardiovascular: Negative for chest pain, palpitations, orthopnea and leg swelling.  Gastrointestinal: Negative for abdominal pain, blood in stool, constipation, diarrhea, heartburn, melena, nausea and vomiting.  Genitourinary: Negative for dysuria, frequency and urgency.  Musculoskeletal: Negative for back pain and joint pain.  Skin: Negative.  Negative for itching and rash.   Neurological: Positive for loss of consciousness. Negative for dizziness, tingling, focal weakness, weakness and headaches.  Endo/Heme/Allergies: Does not bruise/bleed easily.  Psychiatric/Behavioral: Negative for depression. The patient is not nervous/anxious and does not have insomnia.      MEDICAL HISTORY:  Past Medical History:  Diagnosis Date  . Acute on chronic diastolic CHF (congestive heart failure) ([place]) 06/21/2018  . Acute respiratory failure with hypoxia ([place]) 06/21/2018  . Bell palsy 02/12/2015  . Carpal tunnel syndrome 02/12/2015  . Dependence on nocturnal oxygen therapy    2 LITERS WITH BIPAP  . Depression   . Gastric ulcer   . GERD (gastroesophageal reflux disease)   . Gout   . History of chicken pox   . Multifocal pneumonia 03/04/2019  . Sleep apnea treated with nocturnal BiPAP     SURGICAL HISTORY: Past Surgical History:  Procedure Laterality Date  . AV FISTULA PLACEMENT Left 11/08/2019   Procedure: ARTERIOVENOUS (AV) FISTULA CREATION (RADIALCEPHALIC);  Surgeon: Algernon Huxley, MD;  Location: [facility] ORS;  Service: Vascular;  Laterality: Left;  . CATARACT EXTRACTION Bilateral 2014 and 2015  . CHOLECYSTECTOMY  04/28/2011   Laproscopic; Dr. Pat Patrick  . COLONOSCOPY WITH PROPOFOL N/A 12/30/2019   Procedure: COLONOSCOPY WITH PROPOFOL;  Surgeon: Lin Landsman, MD;  Location: [facility] ENDOSCOPY;  Service: Gastroenterology;  Laterality: N/A;  . DIALYSIS/PERMA CATHETER INSERTION N/A 05/24/2019   Procedure: DIALYSIS/PERMA CATHETER INSERTION;  Surgeon: Algernon Huxley, MD;  Location: [facility];  Service: Cardiovascular;  Laterality: N/A;  . DIALYSIS/PERMA CATHETER INSERTION N/A 10/09/2019   Procedure: DIALYSIS/PERMA CATHETER INSERTION;  Surgeon: Katha Cabal, MD;  Location: [place] [facility];  Service: Cardiovascular;  Laterality: N/A;  . ESOPHAGOGASTRODUODENOSCOPY (EGD) WITH PROPOFOL N/A 12/29/2019   Procedure: ESOPHAGOGASTRODUODENOSCOPY (EGD) WITH PROPOFOL;  Surgeon:  Marius Ditch,  Tally Due, MD;  Location: [place];  Service: Gastroenterology;  Laterality: N/A;  . EYE SURGERY    . GALLBLADDER SURGERY    . LASIK Bilateral 2019   medical  . SHOULDER SURGERY Right 2012   Dr. Leanor Kail    SOCIAL HISTORY: Social History   Socioeconomic History  . Marital status: Married    Spouse name: Hilda Blades  . Number of children: Not on file  . Years of education: Not on file  . Highest education level: Not on file  Occupational History  . Occupation: Works at [employer]    Comment: Full time  Tobacco Use  . Smoking status: Former Smoker    Packs/day: 1.00    Years: 15.00    Pack years: 15.00    Types: Cigarettes    Quit date: 05/03/1978    Years since quitting: 41.6  . Smokeless tobacco: Never Used  . Tobacco comment: started smoking at age 31  Vaping Use  . Vaping Use: Never used  Substance and Sexual Activity  . Alcohol use: No    Alcohol/week: 0.0 standard drinks  . Drug use: No  . Sexual activity: Not Currently  Other Topics Concern  . Not on file  Social History Narrative  . Not on file   Social Determinants of Health   Financial Resource Strain:   . Difficulty of Paying Living Expenses: Not on file  Food Insecurity:   . Worried About Charity fundraiser in the Last Year: Not on file  . Ran Out of Food in the Last Year: Not on file  Transportation Needs:   . Lack of Transportation (Medical): Not on file  . Lack of Transportation (Non-Medical): Not on file  Physical Activity:   . Days of Exercise per Week: Not on file  . Minutes of Exercise per Session: Not on file  Stress:   . Feeling of Stress : Not on file  Social Connections:   . Frequency of Communication with Friends and Family: Not on file  . Frequency of Social Gatherings with Friends and Family: Not on file  . Attends Religious Services: Not on file  . Active Member of Clubs or Organizations: Not on file  . Attends Archivist Meetings: Not on file  .  Marital Status: Not on file  Intimate Partner Violence:   . Fear of Current or Ex-Partner: Not on file  . Emotionally Abused: Not on file  . Physically Abused: Not on file  . Sexually Abused: Not on file    FAMILY HISTORY: Family History  Problem Relation Age of Onset  . COPD Mother   . Cancer Mother   . Heart disease Father     ALLERGIES:  is allergic to mucinex [guaifenesin er] and levaquin [levofloxacin].  MEDICATIONS:  Current Facility-Administered Medications  Medication Dose Route Frequency Provider Last Rate Last Admin  . 0.9 %  sodium chloride infusion (Manually program via Guardrails IV Fluids)   Intravenous Once Shawna Clamp, MD      . 0.9 %  sodium chloride infusion (Manually program via Guardrails IV Fluids)   Intravenous Once Shawna Clamp, MD      . 0.9 %  sodium chloride infusion  250 mL Intravenous PRN Mansy, Jan A, MD      . 0.9 %  sodium chloride infusion   Intravenous Continuous Vanga, Tally Due, MD      . acetaminophen (TYLENOL) tablet 650 mg  650 mg Oral Q4H PRN Mansy, Jan A,  MD   650 mg at 12/28/19 0643  . alum & mag hydroxide-simeth (MAALOX/MYLANTA) 200-200-20 MG/5ML suspension 30 mL  30 mL Oral Q4H PRN Sharion Settler, NP   30 mL at 12/27/19 2143  . amiodarone (PACERONE) tablet 200 mg  200 mg Oral Daily Mansy, Jan A, MD   200 mg at 12/30/19 1417  . amLODipine (NORVASC) tablet 5 mg  5 mg Oral Daily Mansy, Jan A, MD   5 mg at 12/31/19 0849  . apixaban (ELIQUIS) tablet 2.5 mg  2.5 mg Oral BID Shawna Clamp, MD   2.5 mg at 12/31/19 2150  . ascorbic acid (VITAMIN C) tablet 1,000 mg  1,000 mg Oral BID Mansy, Jan A, MD   1,000 mg at 12/31/19 2150  . atorvastatin (LIPITOR) tablet 40 mg  40 mg Oral Daily Mansy, Jan A, MD   40 mg at 12/31/19 1726  . calcitRIOL (ROCALTROL) capsule 0.5 mcg  0.5 mcg Oral Daily Mansy, Jan A, MD   0.5 mcg at 12/31/19 0855  . ceFEPIme (MAXIPIME) 2 g in sodium chloride 0.9 % 100 mL IVPB  2 g Intravenous Q24H Shawna Clamp, MD 200  mL/hr at 12/31/19 0853 2 g at 12/31/19 0853  . Chlorhexidine Gluconate Cloth 2 % PADS 6 each  6 each Topical Q0600 Lateef, Munsoor, MD   6 each at 12/31/19 0529  . febuxostat (ULORIC) tablet 80 mg  80 mg Oral Daily Mansy, Jan A, MD   80 mg at 12/31/19 0855  . furosemide (LASIX) injection 40 mg  40 mg Intravenous Q12H Mansy, Jan A, MD   40 mg at 12/31/19 1726  . insulin aspart (novoLOG) injection 0-9 Units  0-9 Units Subcutaneous TID PC & HS Mansy, Arvella Merles, MD   3 Units at 12/31/19 2149  . ipratropium-albuterol (DUONEB) 0.5-2.5 (3) MG/3ML nebulizer solution 3 mL  3 mL Nebulization Q4H PRN Mansy, Jan A, MD      . lactulose (CHRONULAC) 10 GM/15ML solution 20 g  20 g Oral Daily Mansy, Jan A, MD   20 g at 12/31/19 0848  . levothyroxine (SYNTHROID) tablet 100 mcg  100 mcg Oral QAC breakfast Mansy, Jan A, MD   100 mcg at 12/31/19 0849  . metoprolol tartrate (LOPRESSOR) tablet 25 mg  25 mg Oral BID Mansy, Jan A, MD   25 mg at 12/31/19 2150  . pantoprazole (PROTONIX) injection 40 mg  40 mg Intravenous Q12H Vonda Antigua B, MD   40 mg at 12/31/19 2150  . polyethylene glycol-electrolytes (NuLYTELY) solution 4,000 mL  4,000 mL Oral Once Lin Landsman, MD      . sodium chloride 0.9 % bolus 500 mL  500 mL Intravenous Once Hinda Kehr, MD      . sodium chloride flush (NS) 0.9 % injection 3 mL  3 mL Intravenous Q12H Mansy, Jan A, MD   3 mL at 12/31/19 2150  . sodium chloride flush (NS) 0.9 % injection 3 mL  3 mL Intravenous PRN Mansy, Jan A, MD          .  PHYSICAL EXAMINATION:  Vitals:   12/31/19 1640 12/31/19 2018  BP: (!) 166/71 140/69  Pulse: 68 69  Resp: 19   Temp: 98.2 F (36.8 C) 99.3 F (37.4 C)  SpO2: 93% 95%   Filed Weights   12/27/19 0051 12/27/19 2111  Weight: 298 lb 15.1 oz (135.6 kg) (!) 318 lb 11.2 oz (144.6 kg)    Physical Exam HENT:     Head:  Normocephalic and atraumatic.     Mouth/Throat:     Pharynx: No oropharyngeal exudate.  Eyes:     Pupils: Pupils are  equal, round, and reactive to light.  Cardiovascular:     Rate and Rhythm: Normal rate and regular rhythm.  Pulmonary:     Effort: No respiratory distress.     Breath sounds: No wheezing.     Comments: Decreased breath sounds bilateral bases. Abdominal:     General: Bowel sounds are normal. There is no distension.     Palpations: Abdomen is soft. There is no mass.     Tenderness: There is no abdominal tenderness. There is no guarding or rebound.  Musculoskeletal:        General: No tenderness. Normal range of motion.     Cervical back: Normal range of motion and neck supple.  Skin:    General: Skin is warm.  Neurological:     Mental Status: He is alert and oriented to person, place, and time.  Psychiatric:        Mood and Affect: Affect normal.      LABORATORY DATA:  I have reviewed the data as listed Lab Results  Component Value Date   WBC 5.6 12/31/2019   HGB 7.7 (L) 12/31/2019   HCT 23.2 (L) 12/31/2019   MCV 93.5 12/31/2019   PLT 112 (L) 12/31/2019   Recent Labs    11/19/19 0843 11/20/19 0959 11/21/19 0439 11/29/19 1559 12/27/19 0054 12/28/19 0953 12/29/19 1035 12/30/19 0521 12/31/19 0553  NA 137   < >   < > 140 140   < > 139 133* 137  K 4.5   < >   < > 4.5 4.5   < > 4.4 3.4* 3.9  CL 94*   < >   < > 92* 93*   < > 92* 91* 97*  CO2 25   < >   < > 19* 25   < > 30 32 28  GLUCOSE 243*   < >   < > 94 61*   < > 135* 105* 142*  BUN 91*   < >   < > 107* 99*   < > 41* 26* 40*  CREATININE 9.57*   < >   < > 12.49* 17.58*   < > 7.99* 6.15* 8.25*  CALCIUM 9.1   < >   < > 8.8 6.7*   < > 7.1* 7.2* 7.2*  GFRNONAA 5*   < >   < > 4* 3*   < > 7* 9* 6*  GFRAA 6*   < >   < > 4* 3*   < > 8* 10* 7*  PROT 7.9  --   --  7.0 7.6  --   --   --   --   ALBUMIN 3.3*  --   --  3.8 3.2*  --   --   --   --   AST 22  --   --  17 15  --   --   --   --   ALT 30  --   --  20 18  --   --   --   --   ALKPHOS 250*  --   --  152* 114  --   --   --   --   BILITOT 0.7  --   --  0.3 0.9  --   --    --   --    < > =  values in this interval not displayed.    RADIOGRAPHIC STUDIES: I have personally reviewed the radiological images as listed and agreed with the findings in the report. CT ABDOMEN PELVIS WO CONTRAST  Result Date: 12/31/2019 CLINICAL DATA:  Anemia, concern for retroperitoneal hemorrhage EXAM: CT ABDOMEN AND PELVIS WITHOUT CONTRAST TECHNIQUE: Multidetector CT imaging of the abdomen and pelvis was performed following the standard protocol without IV contrast. COMPARISON:  CT 03/13/2015 FINDINGS: Lower chest: Clustered nodular opacities present in the left lung base (4/6) could reflect an acute infectious or inflammatory process. Some dependent atelectasis posteriorly. Cardiac size upper limits normal. Coronary artery calcifications are present. Calcifications seen in the mitral annulus and aortic leaflets. Hypoattenuation of the cardiac blood pool compatible with reported history of anemia. Hepatobiliary: Hepatomegaly. Mild macro lobularity of the hepatic capsule. Caudate and left lobe hypertrophy. Normal hepatic attenuation. No visible liver lesions on this unenhanced CT. Post cholecystectomy. Surgical clips in the gallbladder fossa. No significant biliary ductal dilatation or visible intraductal gallstones. Pancreas: Unremarkable. No pancreatic ductal dilatation or surrounding inflammatory changes. Spleen: Stable splenomegaly. No concerning splenic lesions. Small accessory splenule towards the inferior splenic tip. Adrenals/Urinary Tract: Mild lobular thickening of the adrenal glands, similar to comparison. Bilateral cortical scarring. Scattered subcentimeter fluid attenuation foci too small to fully characterize on CT imaging but statistically likely benign. No visible concerning renal lesions. No urolithiasis or hydronephrosis. Stable mild bilateral symmetric perinephric stranding, a nonspecific finding though may correlate with either age or decreased renal function. Urinary bladder is  largely decompressed at the time of exam and therefore poorly evaluated by CT imaging. Circumferential bladder wall thickening is present, possibly related to underdistention though faint perivesicular haze is noted as well. Stomach/Bowel: Distal esophagus, stomach and duodenal sweep are unremarkable. No small bowel wall thickening or dilatation. No evidence of obstruction. A normal appendix is visualized. There may be some mild colonic wall thickening towards the cecum with adjacent pericolonic stranding. No other significant bowel wall thickening or dilatation of the colon. Vascular/Lymphatic: Focal region of increasing mid mesenteric hazy stranding with numerous reactive appearing clustered mid mesenteric lymph nodes could reflect sequela of mesenteritis. No pathologically enlarged abdominopelvic lymph nodes. Atherosclerotic calcifications within the abdominal aorta and branch vessels. No aneurysm or ectasia. Reproductive: The prostate and seminal vesicles are unremarkable. Other: No abdominopelvic free fluid or free gas. No retroperitoneal or body wall hematoma. No bowel containing hernias. Peritoneal dialysis catheter tip terminates in the left lower quadrant. No complication is seen. Musculoskeletal: Transitional thoracolumbosacral anatomy. For prior numbering convention Rudimentary ribs versus unfused transverse processes at L1. Unchanged grade 1 anterolisthesis is seen at L5-S1 with associated bilateral L5 pars defects, the S1 level appearing partially lumbarized. Multilevel degenerative changes are present elsewhere in the imaged portions of the spine. No acute osseous abnormality or suspicious osseous lesion. IMPRESSION: 1. No evidence of retroperitoneal hematoma. 2. Clustered nodular opacities in the left lung base could reflect an acute infectious or inflammatory process. 3. Circumferential bladder wall thickening is present, possibly related to underdistention though faint perivesicular haze could  suggest an underlying cystitis. Correlate with urinalysis. 4. Focal region of increasing mid mesenteric hazy stranding could reflect sequela of mesenteritis. 5. There may be some mild colonic wall thickening towards the cecum with adjacent pericolonic stranding. Correlate for clinical features of infectious or inflammatory colitis. 6. Hepatosplenomegaly with a macrolobularity of the hepatic capsule and caudate and left lobe hypertrophy, suggestive of cirrhosis. 7. Transitional thoracolumbosacral anatomy. Unchanged grade 1 anterolisthesis at L5-S1 with associated  bilateral L5 pars defects, the S1 level appearing partially lumbarized. 8. Aortic Atherosclerosis (ICD10-I70.0). Electronically Signed   By: Lovena Le M.D.   On: 12/31/2019 20:17   US Venous Img Lower Bilateral (DVT)  Result Date: 12/28/2019 CLINICAL DATA:  Bilateral lower extremity pain and edema for the past months. Patient is currently on anticoagulation. Evaluate for DVT. EXAM: BILATERAL LOWER EXTREMITY VENOUS DOPPLER ULTRASOUND TECHNIQUE: Gray-scale sonography with graded compression, as well as color Doppler and duplex ultrasound were performed to evaluate the lower extremity deep venous systems from the level of the common femoral vein and including the common femoral, femoral, profunda femoral, popliteal and calf veins including the posterior tibial, peroneal and gastrocnemius veins when visible. The superficial great saphenous vein was also interrogated. Spectral Doppler was utilized to evaluate flow at rest and with distal augmentation maneuvers in the common femoral, femoral and popliteal veins. COMPARISON:  None FINDINGS: RIGHT LOWER EXTREMITY Common Femoral Vein: No evidence of thrombus. Normal compressibility, respiratory phasicity and response to augmentation. Saphenofemoral Junction: No evidence of thrombus. Normal compressibility and flow on color Doppler imaging. Profunda Femoral Vein: No evidence of thrombus. Normal compressibility  and flow on color Doppler imaging. Femoral Vein: No evidence of thrombus. Normal compressibility, respiratory phasicity and response to augmentation. Popliteal Vein: No evidence of thrombus within either of the duplicated popliteal veins. Normal compressibility, respiratory phasicity and response to augmentation. Calf Veins: No evidence of thrombus. Normal compressibility and flow on color Doppler imaging. Superficial Great Saphenous Vein: No evidence of thrombus. Normal compressibility. Venous Reflux:  None. Other Findings:  None. LEFT LOWER EXTREMITY Common Femoral Vein: No evidence of thrombus. Normal compressibility, respiratory phasicity and response to augmentation. Saphenofemoral Junction: No evidence of thrombus. Normal compressibility and flow on color Doppler imaging. Profunda Femoral Vein: No evidence of thrombus. Normal compressibility and flow on color Doppler imaging. Femoral Vein: No evidence of thrombus. Normal compressibility, respiratory phasicity and response to augmentation. Popliteal Vein: No evidence of thrombus. Normal compressibility, respiratory phasicity and response to augmentation. Calf Veins: No evidence of thrombus. Normal compressibility and flow on color Doppler imaging. Superficial Great Saphenous Vein: No evidence of thrombus. Normal compressibility. Venous Reflux:  None. Other Findings:  None. IMPRESSION: No evidence of DVT within either lower extremity. Electronically Signed   By: Sandi Mariscal M.D.   On: 12/28/2019 12:49   DG Chest Portable 1 View  Result Date: 12/27/2019 CLINICAL DATA:  64 year old male with shortness of breath. EXAM: PORTABLE CHEST 1 VIEW COMPARISON:  Chest radiograph dated 11/29/2019. FINDINGS: Right-sided dialysis catheter in similar position. There is cardiomegaly with vascular congestion and mild edema, new since the prior radiograph. No focal consolidation, pleural effusion, or pneumothorax. No acute osseous pathology. IMPRESSION: Cardiomegaly with  findings of CHF, new since the prior radiograph. No focal consolidation. Electronically Signed   By: Anner Crete M.D.   On: 12/27/2019 01:17   VAS US DUPLEX DIALYSIS ACCESS (AVF, AVG)  Result Date: 12/25/2019 DIALYSIS ACCESS Reason for Exam: First look. Access Site: Left Upper Extremity. Access Type: Radial-cephalic AVF. History: Created 11/19/2019. Performing Technologist: Concha Norway RVT  Examination Guidelines: A complete evaluation includes B-mode imaging, spectral Doppler, color Doppler, and power Doppler as needed of all accessible portions of each vessel. Unilateral testing is considered an integral part of a complete examination. Limited examinations for reoccurring indications may be performed as noted.  Findings: +--------------------+----------+-----------------+--------+ AVF                 PSV (cm/s)Flow Vol (mL/min)Comments +--------------------+----------+-----------------+--------+  Native artery inflow   210          1279                +--------------------+----------+-----------------+--------+ AVF Anastomosis        233                              +--------------------+----------+-----------------+--------+  +------------+----------+-------------+----------+--------+ OUTFLOW VEINPSV (cm/s)Diameter (cm)Depth (cm)Describe +------------+----------+-------------+----------+--------+ Dist UA         87        0.90                        +------------+----------+-------------+----------+--------+ AC Fossa       106        0.86                        +------------+----------+-------------+----------+--------+ Prox Forearm    63                                    +------------+----------+-------------+----------+--------+ Mid Forearm     73        0.54                        +------------+----------+-------------+----------+--------+ Dist Forearm   167        0.37                        +------------+----------+-------------+----------+--------+   +------------------+-------------+----------+---------+----------+-------------+                   Diameter (cm)Depth (cm)BranchingPSV (cm/s) Flow Volume                                                                (ml/min)    +------------------+-------------+----------+---------+----------+-------------+ Left Rad Art                                          75                  Distal To anast                                                           +------------------+-------------+----------+---------+----------+-------------+ Bidirectional                                                             +------------------+-------------+----------+---------+----------+-------------+  Summary: Patent new left radiocephalic AVF with no evidence of stenosis. Normal flow seen throughout.  *See table(s) above for measurements and observations.  Diagnosing physician: Leotis Pain MD Electronically signed by Leotis Pain MD on  12/25/2019 at 1:59:03 PM.    --------------------------------------------------------------------------------   Final     Acute on chronic anemia #64 year old male patient with multiple medical problems including end-stage renal disease on dialysis-is currently admitted to hospital for mental status changes/secondary uremia; also noted to have severe anemia/on chronic anemia.  #Acute on chronic anemia/secondary to ESRD-5.3 nadir hemoglobin; s/p 5 units of PRBC transfusion-today hemoglobin 7.7 EGD colonoscopy negative for acute blood loss.  Continue Aranesp/iron infusions.   # End-stage renal disease on dialysis  Recommendations:  #Recommend a CT scan abdomen pelvis noncontrast to evaluate for retroperitoneal bleed.  Check LDH haptoglobin reticulocyte count to rule out hemolysis.  Also check Y48 folic acid.  Thank you Dr.Kumar for allowing me to participate in the care of your pleasant patient. Please do not hesitate to contact me with questions or concerns  in the interim.  Discussed with Dr. Dwyane Dee.  Addendum: CT scan abdomen pelvis-negative for any acute retroperitoneal bleed.  I suspect patient's severe anemia is secondary to bone marrow suppression from uremia.  However I do agree with outpatient capsule study.   All questions were answered. The patient knows to call the clinic with any problems, questions or concerns.    Cammie Sickle, MD 12/31/2019 10:13 PM

## 2019-12-31 NOTE — Telephone Encounter (Signed)
-----   Message from Lin Landsman, MD sent at 12/30/2019  1:53 PM EDT ----- Regarding: VCE Please set up VCE next available Dx: Anemia  RV

## 2020-01-01 ENCOUNTER — Ambulatory Visit: Payer: Medicare Other | Admitting: Adult Health

## 2020-01-01 ENCOUNTER — Telehealth: Payer: Self-pay | Admitting: Internal Medicine

## 2020-01-01 DIAGNOSIS — I5032 Chronic diastolic (congestive) heart failure: Secondary | ICD-10-CM

## 2020-01-01 DIAGNOSIS — D696 Thrombocytopenia, unspecified: Secondary | ICD-10-CM

## 2020-01-01 LAB — COMPREHENSIVE METABOLIC PANEL
ALT: 22 U/L (ref 0–44)
AST: 23 U/L (ref 15–41)
Albumin: 2.7 g/dL — ABNORMAL LOW (ref 3.5–5.0)
Alkaline Phosphatase: 92 U/L (ref 38–126)
Anion gap: 11 (ref 5–15)
BUN: 26 mg/dL — ABNORMAL HIGH (ref 8–23)
CO2: 27 mmol/L (ref 22–32)
Calcium: 7.5 mg/dL — ABNORMAL LOW (ref 8.9–10.3)
Chloride: 97 mmol/L — ABNORMAL LOW (ref 98–111)
Creatinine, Ser: 6.12 mg/dL — ABNORMAL HIGH (ref 0.61–1.24)
GFR calc Af Amer: 10 mL/min — ABNORMAL LOW (ref >60)
GFR calc non Af Amer: 9 mL/min — ABNORMAL LOW (ref >60)
Glucose, Bld: 167 mg/dL — ABNORMAL HIGH (ref 70–99)
Potassium: 3.7 mmol/L (ref 3.5–5.1)
Sodium: 135 mmol/L (ref 135–145)
Total Bilirubin: 0.8 mg/dL (ref 0.3–1.2)
Total Protein: 6.4 g/dL — ABNORMAL LOW (ref 6.5–8.1)

## 2020-01-01 LAB — RETICULOCYTES
Immature Retic Fract: 34 % — ABNORMAL HIGH (ref 2.3–15.9)
RBC.: 2.56 MIL/uL — ABNORMAL LOW (ref 4.22–5.81)
Retic Count, Absolute: 111.1 10*3/uL (ref 19.0–186.0)
Retic Ct Pct: 4.3 % — ABNORMAL HIGH (ref 0.4–3.1)

## 2020-01-01 LAB — CBC WITH DIFFERENTIAL/PLATELET
Abs Immature Granulocytes: 0.19 10*3/uL — ABNORMAL HIGH (ref 0.00–0.07)
Basophils Absolute: 0.1 10*3/uL (ref 0.0–0.1)
Basophils Relative: 1 %
Eosinophils Absolute: 0.2 10*3/uL (ref 0.0–0.5)
Eosinophils Relative: 4 %
HCT: 23.5 % — ABNORMAL LOW (ref 39.0–52.0)
Hemoglobin: 8 g/dL — ABNORMAL LOW (ref 13.0–17.0)
Immature Granulocytes: 4 %
Lymphocytes Relative: 12 %
Lymphs Abs: 0.6 10*3/uL — ABNORMAL LOW (ref 0.7–4.0)
MCH: 31 pg (ref 26.0–34.0)
MCHC: 34 g/dL (ref 30.0–36.0)
MCV: 91.1 fL (ref 80.0–100.0)
Monocytes Absolute: 0.6 10*3/uL (ref 0.1–1.0)
Monocytes Relative: 12 %
Neutro Abs: 3.6 10*3/uL (ref 1.7–7.7)
Neutrophils Relative %: 67 %
Platelets: 117 10*3/uL — ABNORMAL LOW (ref 150–400)
RBC: 2.58 MIL/uL — ABNORMAL LOW (ref 4.22–5.81)
RDW: 15.5 % (ref 11.5–15.5)
WBC: 5.4 10*3/uL (ref 4.0–10.5)
nRBC: 0.6 % — ABNORMAL HIGH (ref 0.0–0.2)

## 2020-01-01 LAB — PHOSPHORUS
Phosphorus: 4.3 mg/dL (ref 2.5–4.6)
Phosphorus: 3.6 mg/dL (ref 2.5–4.6)

## 2020-01-01 LAB — CULTURE, BLOOD (ROUTINE X 2)
Culture: NO GROWTH
Culture: NO GROWTH
Special Requests: ADEQUATE
Special Requests: ADEQUATE

## 2020-01-01 LAB — VITAMIN B12: Vitamin B-12: 642 pg/mL (ref 180–914)

## 2020-01-01 LAB — MAGNESIUM: Magnesium: 1.9 mg/dL (ref 1.7–2.4)

## 2020-01-01 LAB — FOLATE: Folate: 7.3 ng/mL (ref >5.9)

## 2020-01-01 LAB — GLUCOSE, CAPILLARY
Glucose-Capillary: 191 mg/dL — ABNORMAL HIGH (ref 70–99)
Glucose-Capillary: 307 mg/dL — ABNORMAL HIGH (ref 70–99)

## 2020-01-01 LAB — SURGICAL PATHOLOGY

## 2020-01-01 LAB — LACTATE DEHYDROGENASE: LDH: 221 U/L — ABNORMAL HIGH (ref 98–192)

## 2020-01-01 MED ORDER — METOPROLOL TARTRATE 50 MG PO TABS: 25.0000 mg | ORAL_TABLET | Freq: Two times a day (BID) | ORAL | Status: AC

## 2020-01-01 NOTE — Progress Notes (Signed)
Patrick Hurst   DOB:1956-04-20   NW#:[id number]    Subjective: Patient ambulating independently.  He is accompanied by his wife.  Mild shortness of breath on exertion however overall improved.  Objective:  Vitals:   01/01/20 0830 01/01/20 1137  BP: (!) 156/59 (!) 165/93  Pulse: 69 65  Resp: 19 18  Temp: 98 F (36.7 C) 98 F (36.7 C)  SpO2: 94% 93%     Intake/Output Summary (Last 24 hours) at 01/01/2020 1701 Last data filed at 01/01/2020 1345 Gross per 24 hour  Intake 1300 ml  Output 225 ml  Net 1075 ml    Physical Exam HENT:     Head: Normocephalic and atraumatic.     Mouth/Throat:     Pharynx: No oropharyngeal exudate.  Eyes:     Pupils: Pupils are equal, round, and reactive to light.  Cardiovascular:     Rate and Rhythm: Normal rate and regular rhythm.  Pulmonary:     Effort: Pulmonary effort is normal. No respiratory distress.     Breath sounds: Normal breath sounds. No wheezing.  Abdominal:     General: Bowel sounds are normal. There is no distension.     Palpations: Abdomen is soft. There is no mass.     Tenderness: There is no abdominal tenderness. There is no guarding or rebound.  Musculoskeletal:        General: No tenderness. Normal range of motion.     Cervical back: Normal range of motion and neck supple.  Skin:    General: Skin is warm.  Neurological:     Mental Status: He is alert and oriented to person, place, and time.  Psychiatric:        Mood and Affect: Affect normal.      Labs:  Lab Results  Component Value Date   WBC 5.4 01/01/2020   HGB 8.0 (L) 01/01/2020   HCT 23.5 (L) 01/01/2020   MCV 91.1 01/01/2020   PLT 117 (L) 01/01/2020   NEUTROABS 3.6 01/01/2020    Lab Results  Component Value Date   NA 135 01/01/2020   K 3.7 01/01/2020   CL 97 (L) 01/01/2020   CO2 27 01/01/2020    Studies:  CT ABDOMEN PELVIS WO CONTRAST  Result Date: 12/31/2019 CLINICAL DATA:  Anemia, concern for retroperitoneal hemorrhage EXAM: CT ABDOMEN AND PELVIS  WITHOUT CONTRAST TECHNIQUE: Multidetector CT imaging of the abdomen and pelvis was performed following the standard protocol without IV contrast. COMPARISON:  CT 03/13/2015 FINDINGS: Lower chest: Clustered nodular opacities present in the left lung base (4/6) could reflect an acute infectious or inflammatory process. Some dependent atelectasis posteriorly. Cardiac size upper limits normal. Coronary artery calcifications are present. Calcifications seen in the mitral annulus and aortic leaflets. Hypoattenuation of the cardiac blood pool compatible with reported history of anemia. Hepatobiliary: Hepatomegaly. Mild macro lobularity of the hepatic capsule. Caudate and left lobe hypertrophy. Normal hepatic attenuation. No visible liver lesions on this unenhanced CT. Post cholecystectomy. Surgical clips in the gallbladder fossa. No significant biliary ductal dilatation or visible intraductal gallstones. Pancreas: Unremarkable. No pancreatic ductal dilatation or surrounding inflammatory changes. Spleen: Stable splenomegaly. No concerning splenic lesions. Small accessory splenule towards the inferior splenic tip. Adrenals/Urinary Tract: Mild lobular thickening of the adrenal glands, similar to comparison. Bilateral cortical scarring. Scattered subcentimeter fluid attenuation foci too small to fully characterize on CT imaging but statistically likely benign. No visible concerning renal lesions. No urolithiasis or hydronephrosis. Stable mild bilateral symmetric perinephric stranding, a nonspecific finding  though may correlate with either age or decreased renal function. Urinary bladder is largely decompressed at the time of exam and therefore poorly evaluated by CT imaging. Circumferential bladder wall thickening is present, possibly related to underdistention though faint perivesicular haze is noted as well. Stomach/Bowel: Distal esophagus, stomach and duodenal sweep are unremarkable. No small bowel wall thickening or  dilatation. No evidence of obstruction. A normal appendix is visualized. There may be some mild colonic wall thickening towards the cecum with adjacent pericolonic stranding. No other significant bowel wall thickening or dilatation of the colon. Vascular/Lymphatic: Focal region of increasing mid mesenteric hazy stranding with numerous reactive appearing clustered mid mesenteric lymph nodes could reflect sequela of mesenteritis. No pathologically enlarged abdominopelvic lymph nodes. Atherosclerotic calcifications within the abdominal aorta and branch vessels. No aneurysm or ectasia. Reproductive: The prostate and seminal vesicles are unremarkable. Other: No abdominopelvic free fluid or free gas. No retroperitoneal or body wall hematoma. No bowel containing hernias. Peritoneal dialysis catheter tip terminates in the left lower quadrant. No complication is seen. Musculoskeletal: Transitional thoracolumbosacral anatomy. For prior numbering convention Rudimentary ribs versus unfused transverse processes at L1. Unchanged grade 1 anterolisthesis is seen at L5-S1 with associated bilateral L5 pars defects, the S1 level appearing partially lumbarized. Multilevel degenerative changes are present elsewhere in the imaged portions of the spine. No acute osseous abnormality or suspicious osseous lesion. IMPRESSION: 1. No evidence of retroperitoneal hematoma. 2. Clustered nodular opacities in the left lung base could reflect an acute infectious or inflammatory process. 3. Circumferential bladder wall thickening is present, possibly related to underdistention though faint perivesicular haze could suggest an underlying cystitis. Correlate with urinalysis. 4. Focal region of increasing mid mesenteric hazy stranding could reflect sequela of mesenteritis. 5. There may be some mild colonic wall thickening towards the cecum with adjacent pericolonic stranding. Correlate for clinical features of infectious or inflammatory colitis. 6.  Hepatosplenomegaly with a macrolobularity of the hepatic capsule and caudate and left lobe hypertrophy, suggestive of cirrhosis. 7. Transitional thoracolumbosacral anatomy. Unchanged grade 1 anterolisthesis at L5-S1 with associated bilateral L5 pars defects, the S1 level appearing partially lumbarized. 8. Aortic Atherosclerosis (ICD10-I70.0). Electronically Signed   By: Lovena Le M.D.   On: 12/31/2019 20:17    Acute on chronic anemia #64 year old male patient with multiple medical problems including end-stage renal disease on dialysis-is currently admitted to hospital for mental status changes/secondary uremia; also noted to have severe anemia/on chronic anemia.  #Acute on chronic anemia/secondary to ESRD-5.3 nadir hemoglobin; s/p 5 units of PRBC transfusion-today hemoglobin 8.0 EGD /colonoscopy negative for acute blood loss.  CT scan negative for any acute retroperitoneal bleed.  Awaiting hemolysis work-up.  LDH- Slightly elevated haptoglobin pending.  Recommend continued iron/EPO; increase the dose of 40,000 units weekly.  Discussed with Dr. Juleen China.  # End-stage renal disease on dialysis-peritoneal.   #Discussed with patient and his wife Debbie-the above plan of care.  In agreement.  We will make arrangements for follow-up next week.  Discussed with Dr. Dwyane Dee.   Cammie Sickle, MD 01/01/2020  5:01 PM

## 2020-01-01 NOTE — Progress Notes (Signed)
Inpatient Diabetes Program Recommendations  AACE/ADA: New Consensus Statement on Inpatient Glycemic Control   Target Ranges:  Prepandial:   less than 140 mg/dL      Peak postprandial:   less than 180 mg/dL (1-2 hours)      Critically ill patients:  140 - 180 mg/dL   Results for LONDYN, WOTTON (MRN [id number]) as of 01/01/2020 12:23  Ref. Range 12/31/2019 08:00 12/31/2019 16:39 12/31/2019 21:14 01/01/2020 08:28 01/01/2020 11:36  Glucose-Capillary Latest Ref Range: 70 - 99 mg/dL 139 (H) 236 (H) 216 (H) 191 (H) 307 (H)  Results for TYMARION, EVERARD (MRN [id number]) as of 01/01/2020 12:23  Ref. Range 12/27/2019 00:54  Glucose Latest Ref Range: 70 - 99 mg/dL 61 (L)   Review of Glycemic Control  Diabetes history: DM2 Outpatient Diabetes medications: Humulin R U500 60 units QAM, Humulin R U500 50 units QPM Current orders for Inpatient glycemic control: Novolog 0-9 units AC&HS  Inpatient Diabetes Program Recommendations:    Insulin-Meal Coverage: Please consider ordering Novolog 5 units TID with meals for meal coverage if patient eats at least 50% of meals.  Outpatient DM regimen: Patient may need an adjustment with outpatient DM medication especially if he is having frequent hypoglycemia.  NOTE: Patient found to be hypoglycemia via EMS and also hypoglycemia upon presentation to Emergency Room on 12/27/19. Patient has not received any basal insulin since being admitted 5 days ago. Fasting glucose 191 mg/dl today and noon glucose up to 307 mg/dl. Recommend adding Novolog meal coverage as post prandial glucose is consistently elevated.  Thanks, Barnie Alderman, RN, MSN, CDE Diabetes Coordinator Inpatient Diabetes Program [phone] (Team Pager from 8am to 5pm)

## 2020-01-01 NOTE — Progress Notes (Signed)
OT Screen Note  Patient Details Name: Patrick Hurst MRN: [id number] DOB: [birth year]   Cancelled Treatment:    Reason Eval/Treat Not Completed: OT screened, no needs identified, will sign off. Order received, chart reviewed. Pt at baseline functional independence for ADL tasks. Spouse present and in agreement. No skilled OT needs identified. Will sign off. Please re-consult if additional needs arise.   Jeni Salles, MPH, MS, OTR/L ascom [phone] 01/01/20, 11:53 AM

## 2020-01-01 NOTE — Progress Notes (Signed)
[facility]  ROUNDING NOTE   Subjective:  Patient resting in bed, in no acute distress, reports as feeling better, wife at the bedside.   Objective:  Vital signs in last 24 hours:  Temp:  [97.6 F (36.4 C)-99.3 F (37.4 C)] 98 F (36.7 C) (08/31 1137) Pulse Rate:  [58-69] 65 (08/31 1137) Resp:  [18-19] 18 (08/31 1137) BP: (140-166)/(54-93) 165/93 (08/31 1137) SpO2:  [93 %-97 %] 93 % (08/31 1137) Weight:  [134.6 kg] 134.6 kg (08/31 0500)  Weight change:  Filed Weights   12/27/19 0051 12/27/19 2111 01/01/20 0500  Weight: 135.6 kg (!) 144.6 kg 134.6 kg    Intake/Output: I/O last 3 completed shifts: In: 1314.8 [P.O.:1200; IV Piggyback:114.8] Out: 100 [Urine:100]   Intake/Output this shift:  Total I/O In: 460 [P.O.:360; IV Piggyback:100] Out: 125 [Urine:125]  Physical Exam: General: Well appearing, pleasant  Head: Normocephalic, atraumatic. Moist oral mucosal membranes  Eyes: Sclerae and conjunctivae clear  Neck: Supple, trachea at midline  Lungs:  Normal and symmetrical respiratory effort, Lungs diminished at the bases  Heart: S1S2, no rub or gallop  Abdomen:  Nontender,PD catheter in place with dressing CDI  Extremities:  2+ Bilateral lower extremity edema.  Neurologic: Alert, oriented x 3  Skin: No scars, lesions or rashes noted  Access: Rt IJ catheter    Basic Metabolic Panel: Recent Labs  Lab 12/28/19 0953 12/28/19 0953 12/29/19 1035 12/29/19 1035 12/30/19 0521 12/31/19 0553 01/01/20 0427 01/01/20 1309  NA 135  --  139  --  133* 137 135  --   K 5.0  --  4.4  --  3.4* 3.9 3.7  --   CL 92*  --  92*  --  91* 97* 97*  --   CO2 26  --  30  --  32 28 27  --   GLUCOSE 161*  --  135*  --  105* 142* 167*  --   BUN 57*  --  41*  --  26* 40* 26*  --   CREATININE 10.52*  --  7.99*  --  6.15* 8.25* 6.12*  --   CALCIUM 6.7*   < > 7.1*   < > 7.2* 7.2* 7.5*  --   MG 1.7  --  1.8  --  1.7 2.1 1.9  --   PHOS 7.8*   < > 5.7*  --  4.6 6.3* 4.3 3.6   < >  = values in this interval not displayed.    Liver Function Tests: Recent Labs  Lab 12/27/19 0054 01/01/20 0427  AST 15 23  ALT 18 22  ALKPHOS 114 92  BILITOT 0.9 0.8  PROT 7.6 6.4*  ALBUMIN 3.2* 2.7*   Recent Labs  Lab 12/27/19 0054  LIPASE 29   No results for input(s): AMMONIA in the last 168 hours.  CBC: Recent Labs  Lab 12/27/19 0054 12/27/19 0426 12/28/19 0953 12/28/19 1727 12/29/19 0036 12/29/19 1035 12/30/19 0521 12/31/19 0553 01/01/20 0427  WBC 9.4   < > 7.4  --   --  7.3 5.7 5.6 5.4  NEUTROABS 7.1  --   --   --   --   --   --   --  3.6  HGB 5.7*   < > 6.6*   < > 6.9* 6.6* 8.1* 7.7* 8.0*  HCT 17.9*   < > 19.1*   < > 20.7* 19.8* 23.6* 23.2* 23.5*  MCV 96.2   < > 90.1  --   --  93.0 90.4 93.5 91.1  PLT 109*   < > 117*  --   --  105* 104* 112* 117*   < > = values in this interval not displayed.    Cardiac Enzymes: No results for input(s): CKTOTAL, CKMB, CKMBINDEX, TROPONINI in the last 168 hours.  BNP: Invalid input(s): POCBNP  CBG: Recent Labs  Lab 12/31/19 0800 12/31/19 1639 12/31/19 2114 01/01/20 0828 01/01/20 1136  GLUCAP 139* 236* 216* 191* 307*    Microbiology: Results for orders placed or performed during the hospital encounter of 12/27/19  SARS Coronavirus 2 by RT PCR (hospital order, performed in [facility] hospital lab) Nasopharyngeal Nasopharyngeal Swab     Status: None   Collection Time: 12/27/19 12:54 AM   Specimen: Nasopharyngeal Swab  Result Value Ref Range Status   SARS Coronavirus 2 NEGATIVE NEGATIVE Final    Comment: (NOTE) SARS-CoV-2 target nucleic acids are NOT DETECTED.  The SARS-CoV-2 RNA is generally detectable in upper and lower respiratory specimens during the acute phase of infection. The lowest concentration of SARS-CoV-2 viral copies this assay can detect is 250 copies / mL. A negative result does not preclude SARS-CoV-2 infection and should not be used as the sole basis for treatment or other patient  management decisions.  A negative result may occur with improper specimen collection / handling, submission of specimen other than nasopharyngeal swab, presence of viral mutation(s) within the areas targeted by this assay, and inadequate number of viral copies (<250 copies / mL). A negative result must be combined with clinical observations, patient history, and epidemiological information.  Fact Sheet for Patients:   StrictlyIdeas.no  Fact Sheet for Healthcare Providers: BankingDealers.co.za  This test is not yet approved or  cleared by the [place] FDA and has been authorized for detection and/or diagnosis of SARS-CoV-2 by FDA under an Emergency Use Authorization (EUA).  This EUA will remain in effect (meaning this test can be used) for the duration of the COVID-19 declaration under Section 564(b)(1) of the Act, 21 U.S.C. section 360bbb-3(b)(1), unless the authorization is terminated or revoked sooner.  Performed at [facility], [address] [place], [place] [address]   Blood culture (routine x 2)     Status: None   Collection Time: 12/27/19  3:07 AM   Specimen: BLOOD  Result Value Ref Range Status   Specimen Description BLOOD RIGHT HAND  Final   Special Requests   Final    BOTTLES DRAWN AEROBIC AND ANAEROBIC Blood Culture adequate volume   Culture   Final    NO GROWTH 5 DAYS Performed at [facility], [address], [place], [place] [address]    Report Status 01/01/2020 FINAL  Final  Blood culture (routine x 2)     Status: None   Collection Time: 12/27/19  3:07 AM   Specimen: BLOOD  Result Value Ref Range Status   Specimen Description BLOOD RIGHT [facility]  Final   Special Requests   Final    BOTTLES DRAWN AEROBIC AND ANAEROBIC Blood Culture adequate volume   Culture   Final    NO GROWTH 5 DAYS Performed at [facility], [address] [place], [place] [address]    Report Status  01/01/2020 FINAL  Final  MRSA PCR Screening     Status: None   Collection Time: 12/27/19  3:45 PM   Specimen: Nasopharyngeal  Result Value Ref Range Status   MRSA by PCR NEGATIVE NEGATIVE Final    Comment:  The GeneXpert MRSA Assay (FDA approved for NASAL specimens only), is one component of a comprehensive MRSA colonization surveillance program. It is not intended to diagnose MRSA infection nor to guide or monitor treatment for MRSA infections. Performed at [facility], [address] [place], [place] [address]     Coagulation Studies: No results for input(s): LABPROT, INR in the last 72 hours.  Urinalysis: No results for input(s): COLORURINE, LABSPEC, PHURINE, GLUCOSEU, HGBUR, BILIRUBINUR, KETONESUR, PROTEINUR, UROBILINOGEN, NITRITE, LEUKOCYTESUR in the last 72 hours.  Invalid input(s): APPERANCEUR    Imaging: CT ABDOMEN PELVIS WO CONTRAST  Result Date: 12/31/2019 CLINICAL DATA:  Anemia, concern for retroperitoneal hemorrhage EXAM: CT ABDOMEN AND PELVIS WITHOUT CONTRAST TECHNIQUE: Multidetector CT imaging of the abdomen and pelvis was performed following the standard protocol without IV contrast. COMPARISON:  CT 03/13/2015 FINDINGS: Lower chest: Clustered nodular opacities present in the left lung base (4/6) could reflect an acute infectious or inflammatory process. Some dependent atelectasis posteriorly. Cardiac size upper limits normal. Coronary artery calcifications are present. Calcifications seen in the mitral annulus and aortic leaflets. Hypoattenuation of the cardiac blood pool compatible with reported history of anemia. Hepatobiliary: Hepatomegaly. Mild macro lobularity of the hepatic capsule. Caudate and left lobe hypertrophy. Normal hepatic attenuation. No visible liver lesions on this unenhanced CT. Post cholecystectomy. Surgical clips in the gallbladder fossa. No significant biliary ductal dilatation or visible intraductal gallstones. Pancreas:  Unremarkable. No pancreatic ductal dilatation or surrounding inflammatory changes. Spleen: Stable splenomegaly. No concerning splenic lesions. Small accessory splenule towards the inferior splenic tip. Adrenals/Urinary Tract: Mild lobular thickening of the adrenal glands, similar to comparison. Bilateral cortical scarring. Scattered subcentimeter fluid attenuation foci too small to fully characterize on CT imaging but statistically likely benign. No visible concerning renal lesions. No urolithiasis or hydronephrosis. Stable mild bilateral symmetric perinephric stranding, a nonspecific finding though may correlate with either age or decreased renal function. Urinary bladder is largely decompressed at the time of exam and therefore poorly evaluated by CT imaging. Circumferential bladder wall thickening is present, possibly related to underdistention though faint perivesicular haze is noted as well. Stomach/Bowel: Distal esophagus, stomach and duodenal sweep are unremarkable. No small bowel wall thickening or dilatation. No evidence of obstruction. A normal appendix is visualized. There may be some mild colonic wall thickening towards the cecum with adjacent pericolonic stranding. No other significant bowel wall thickening or dilatation of the colon. Vascular/Lymphatic: Focal region of increasing mid mesenteric hazy stranding with numerous reactive appearing clustered mid mesenteric lymph nodes could reflect sequela of mesenteritis. No pathologically enlarged abdominopelvic lymph nodes. Atherosclerotic calcifications within the abdominal aorta and branch vessels. No aneurysm or ectasia. Reproductive: The prostate and seminal vesicles are unremarkable. Other: No abdominopelvic free fluid or free gas. No retroperitoneal or body wall hematoma. No bowel containing hernias. Peritoneal dialysis catheter tip terminates in the left lower quadrant. No complication is seen. Musculoskeletal: Transitional thoracolumbosacral  anatomy. For prior numbering convention Rudimentary ribs versus unfused transverse processes at L1. Unchanged grade 1 anterolisthesis is seen at L5-S1 with associated bilateral L5 pars defects, the S1 level appearing partially lumbarized. Multilevel degenerative changes are present elsewhere in the imaged portions of the spine. No acute osseous abnormality or suspicious osseous lesion. IMPRESSION: 1. No evidence of retroperitoneal hematoma. 2. Clustered nodular opacities in the left lung base could reflect an acute infectious or inflammatory process. 3. Circumferential bladder wall thickening is present, possibly related to underdistention though faint perivesicular haze could suggest an underlying cystitis. Correlate with urinalysis. 4.  Focal region of increasing mid mesenteric hazy stranding could reflect sequela of mesenteritis. 5. There may be some mild colonic wall thickening towards the cecum with adjacent pericolonic stranding. Correlate for clinical features of infectious or inflammatory colitis. 6. Hepatosplenomegaly with a macrolobularity of the hepatic capsule and caudate and left lobe hypertrophy, suggestive of cirrhosis. 7. Transitional thoracolumbosacral anatomy. Unchanged grade 1 anterolisthesis at L5-S1 with associated bilateral L5 pars defects, the S1 level appearing partially lumbarized. 8. Aortic Atherosclerosis (ICD10-I70.0). Electronically Signed   By: Lovena Le M.D.   On: 12/31/2019 20:17     Medications:   . sodium chloride    . sodium chloride    . ceFEPime (MAXIPIME) IV 2 g (01/01/20 1009)  . sodium chloride     . sodium chloride   Intravenous Once  . sodium chloride   Intravenous Once  . amiodarone  200 mg Oral Daily  . amLODipine  5 mg Oral Daily  . apixaban  2.5 mg Oral BID  . vitamin C  1,000 mg Oral BID  . atorvastatin  40 mg Oral Daily  . calcitRIOL  0.5 mcg Oral Daily  . Chlorhexidine Gluconate Cloth  6 each Topical Q0600  . febuxostat  80 mg Oral Daily  .  furosemide  40 mg Intravenous Q12H  . insulin aspart  0-9 Units Subcutaneous TID PC & HS  . lactulose  20 g Oral Daily  . levothyroxine  100 mcg Oral QAC breakfast  . metoprolol tartrate  25 mg Oral BID  . pantoprazole (PROTONIX) IV  40 mg Intravenous Q12H  . polyethylene glycol-electrolytes  4,000 mL Oral Once  . sodium chloride flush  3 mL Intravenous Q12H   sodium chloride, acetaminophen, alum & mag hydroxide-simeth, ipratropium-albuterol, sodium chloride flush  Assessment/ Plan:  Mr. DAEL HOWLAND is a 64 y.o. Caucasian male with a known history of end-stage renal disease on peritoneal dialysis, CHF, COPD, atrial fibrillation, depression, GERD and goutand obstructive sleep apnea on CPAP, who presented to the emergency room with acute onset of dyspnea withAltered mentalstatus and decreased responsiveness.He  Was in acute respiratory failure and had severe anemia.Received multiple blood transfusions and dialysis to prevent overload.  [place] Peritoneal Dialysis with use of intermittent hemodialysis  # ESRD  Patient is on peritoneal dialysis with use of intermittent hemodialysis to help with fluid overload Patient is on CCPD 5.  Exchanges as an outpatient Patient received dialysis treatment yesterday. Fluid and volume status acceptable, additional hemodialysis not required today.   #Anemia  Hgb  8.0 today Receiving 5 units of PRBC during this admission. Patient underwent EGD and colonoscopy which was unremarkable, GI recommended outpatient capsule endoscopy.  #Secondary hyperparathyroidism -CKD Mineral-Bone Disorder  PTH 222 ON 12/27/19  # Hypertension  - blood pressure not at goal. Home regimen of torsemide, amlodipine and metoprolol   Patient was seen and examined with Crosby Oyster, [place]. Additionally will monitor patient for dialysis need. Resume peritoneal dialysis on discharge. Appreciate hematology input on outpatient EPO dosing. Above plan was discussed and  agreed upon on the signing of this note.    LOS: 5 Khaliyah Northrop 8/31/20213:39 PM

## 2020-01-01 NOTE — Evaluation (Signed)
Physical Therapy Evaluation Patient Details Name: KONSTANTINOS CORDOBA MRN: [id number] DOB: Jan 22, 1956 Today's Date: 01/01/2020   History of Present Illness  Pt is a 64 y.o. Caucasian male  with a known history of end-stage renal disease on peritoneal dialysis, CHF, COPD, atrial fibrillation, depression, GERD and gout and obstructive sleep apnea on CPAP, who presented to the emergency room with acute onset of dyspnea with Altered mental status and decreased responsiveness.He  Was in acute respiratory failure and had severe anemia.Received multiple blood transfusions and dialysis to prevent overload, has also undergone EGD and colonoscopy during this stay.    Clinical Impression  Pt alert, up in chair. Reported since his last admission he is independent at home, has not returned to driving, no falls. Pt reported wanting to use a RW at home but has not received a walker yet, CSW notified by PT.  The patient demonstrated transfers modI, no unsteadiness noted during mobility. He ambulated ~153ft with RW and supervision, cued for 1-2 standing rest breaks to allow assessment of spO2 readings, with clear readings spO2 >90% on room air. Pt did endorse/exhibit SOB after ambulation and returned to sitting in chair that he has not quite returned to his baseline.  Overall the patient demonstrated deficits (see "PT Problem List") that impede the patient's functional abilities, safety, and mobility and would benefit from skilled PT intervention. Recommendation is outpatient PT to maximize pt function, mobility, and return to PLOF.     Follow Up Recommendations Outpatient PT    Equipment Recommendations  Rolling walker with 5" wheels    Recommendations for Other Services       Precautions / Restrictions Precautions Precautions: Fall Restrictions Weight Bearing Restrictions: No      Mobility  Bed Mobility               General bed mobility comments: pt up in recliner at start of  session  Transfers Overall transfer level: Modified independent Equipment used: Rolling walker (2 wheeled)                Ambulation/Gait Ambulation/Gait assistance: Supervision Gait Distance (Feet): 150 Feet Assistive device: Rolling walker (2 wheeled)       General Gait Details: cued for 1-2 standing rest breaks to allow for assessment of spO2, greater than 90% throughout mobility with functional reading. Pt did endorse/exhibit SOB after ambulation and returned to sitting in chair.  Stairs            Wheelchair Mobility    Modified Rankin (Stroke Patients Only)       Balance Overall balance assessment: Needs assistance Sitting-balance support: Feet supported Sitting balance-Leahy Scale: Normal       Standing balance-Leahy Scale: Good Standing balance comment: pt more comfortable with BUE support for ambulation                             Pertinent Vitals/Pain Pain Assessment: No/denies pain    Home Living Family/patient expects to be discharged to:: Private residence Living Arrangements: Spouse/significant other Available Help at Discharge: Family;Available 24 hours/day Type of Home: House Home Access: Stairs to enter Entrance Stairs-Rails: Right Entrance Stairs-Number of Steps: Depending upon which side of the house. Per pt and wife about 4 steps from back where patient normally goes up. Home Layout: One level Home Equipment: Walker - 2 wheels;Cane - single point;Bedside commode Additional Comments: Patient is independent at with ADLs, household/community ambulation without AD's though limited in distance  by SOB.    Prior Function Level of Independence: Independent;Needs assistance   Gait / Transfers Assistance Needed: was told he was going to go home with a walker, but did not recieve one.     Comments: Patient is independent at home and at work. 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     Hand  Dominance   Dominant Hand: Right    Extremity/Trunk Assessment   Upper Extremity Assessment Upper Extremity Assessment: Overall WFL for tasks assessed    Lower Extremity Assessment Lower Extremity Assessment: Generalized weakness    Cervical / Trunk Assessment Cervical / Trunk Assessment: Normal  Communication   Communication: No difficulties  Cognition Arousal/Alertness: Awake/alert Behavior During Therapy: WFL for tasks assessed/performed Overall Cognitive Status: Within Functional Limits for tasks assessed                                        General Comments      Exercises     Assessment/Plan    PT Assessment Patient needs continued PT services  PT Problem List Decreased mobility;Decreased balance;Decreased activity tolerance;Cardiopulmonary status limiting activity       PT Treatment Interventions DME instruction;Balance training;Gait training;Neuromuscular re-education;Stair training;Functional mobility training;Patient/family education;Therapeutic activities;Therapeutic exercise    PT Goals (Current goals can be found in the Care Plan section)  Acute Rehab PT Goals Patient Stated Goal: to go home PT Goal Formulation: With patient Time For Goal Achievement: 01/15/20 Potential to Achieve Goals: Good    Frequency Min 2X/week   Barriers to discharge        Co-evaluation               AM-PAC PT "6 Clicks" Mobility  Outcome Measure Help needed turning from your back to your side while in a flat bed without using bedrails?: None Help needed moving from lying on your back to sitting on the side of a flat bed without using bedrails?: None Help needed moving to and from a bed to a chair (including a wheelchair)?: None Help needed standing up from a chair using your arms (e.g., wheelchair or bedside chair)?: None Help needed to walk in hospital room?: None Help needed climbing 3-5 steps with a railing? : A Little 6 Click Score: 23     End of Session Equipment Utilized During Treatment: Gait belt Activity Tolerance: Patient tolerated treatment well Patient left: in chair;with nursing/sitter in room Nurse Communication: Mobility status PT Visit Diagnosis: Other abnormalities of gait and mobility (R26.89);Muscle weakness (generalized) (M62.81);Difficulty in walking, not elsewhere classified (R26.2)    Time: 0093-8182 PT Time Calculation (min) (ACUTE ONLY): 26 min   Charges:   PT Evaluation $PT Eval Low Complexity: 1 Low PT Treatments $Therapeutic Exercise: 8-22 mins       Lieutenant Diego PT, DPT 11:43 AM,01/01/20

## 2020-01-01 NOTE — Discharge Instructions (Signed)
Advised to follow-up with primary care physician in 1 week. Patient admitted with acute encephalopathy secondary to fluid overload. Patient has received hemodialysis 3 days in a row renal functions improved. Advised to follow-up with nephrology and continue with peritoneal dialysis is advised. Advised to follow-up with GI in 3-4 weeks for capsule endoscopy. Advised to follow-up with hematology for anemia work-up.

## 2020-01-01 NOTE — Telephone Encounter (Signed)
C- Schedule follow up- Mid-next week- MD;lab- cbc/hold tube/ bmp; possible 1 unit PRBC. Dr.B

## 2020-01-01 NOTE — Discharge Summary (Signed)
Physician Discharge Summary  Patrick Hurst QBH:[id number] DOB: 07/14/1955 DOA: 12/27/2019  PCP: [name], MD  Admit date: 12/27/2019   Discharge date: 01/01/2020  Admitted From: Home.  Disposition:   Home  Recommendations for Outpatient Follow-up:  1. Follow up with PCP in 1-2 weeks. 2. Please obtain BMP/CBC in one week. 3. Patient admitted with acute encephalopathy secondary to fluid overload/ Uremia. 4. Patient has received hemodialysis 3 days in a row,  renal functions improved. 5. Advised to follow-up with Nephrology and continue with peritoneal dialysis as advised. 6. Advised to follow-up with GI in 3-4 weeks for capsule endoscopy. 7. Advised to follow-up with hematology for anemia work-up.  Home Health: None.  Equipment/Devices: None.  Discharge Condition: Stable CODE STATUS:Full code Diet recommendation: Heart Healthy   Brief Summary: [name] a64 y.o.Caucasian malewith a known history of end-stage renal disease on peritoneal dialysis, CHF, COPD, atrial fibrillation, depression, GERD and goutand obstructive sleep apnea on CPAP, who presented to the emergency room with acute onset of dyspnea withAltered mentalstatus and decreased responsiveness. Patient had a blood glucose of 52 by EMS. He was given 200 mL of IV D10W without significant improvement in his mental status. He was then given 3 mg of IV Narcan and was more alert and conversant.He was better after hecame to the ER however becamesomnolent again with sonorous respirations. He was found to be hypoxemic by EMS as well with pulse currently in the mid to upper 80s for which he was placed on nonrebreather and pulse currently was up to 93%. In the ER he received an ampule of D 50 as his blood glucose was again 61. He has been fully vaccinated for COVID-19. Upon presentation to the emergency room, blood pressure was 95/47 pulse oximetry of 94% on 100% nonrebreather and otherwise normal vital signs..  He was ordered BiPAP. Labs revealed ABG with pH 7.13 and bicarbonate of 22. CMP showed a BUN of 99 and creatinine of 17.5 with a low glucose of 61 anion gap of 22 and CO2 25. Albumin was 3.2 total protein of 7.6. BNP was 319.9 lactic acid 0.8 and later 0.64 procalcitonin of 1.35. CBC showed significant anemia with hemoglobin of 5.7 hematocrit 17.9 compared to 8.5 and 24.3 about a month ago. INR is 1.3 and PT 15.7. Urine drug screen was positive for tricyclics. Blood cultures were drawn. Chest x-ray showed cardiomegaly with findings of CHF that is new compared to previous chest x-ray. There was no focal consolidation.  Hospital course: Patient was admitted for acute encephalopathy which could be multifactorial,  Patient at home is on peritoneal dialysis presented with worsening renal functions, serum creatinine on admission 17.89.  Acute respiratory failure requiring BiPAP. Nephrology consulted,  Patient underwent hemodialysis 3 days in a row,  feels much better, Hemoglobin remains low,  GI consulted recommended GI work-up.  Patient underwent EGD and colonoscopy which was unremarkable, GI recommended outpatient capsule endoscopy.  Patient  has received 5 units PRBC during this hospitalization.  Hb above 8.0.  Acute encephalopathy has resolved.  Acute respiratory failure has resolved.  Patient is on room air saturating 95%. Patient is more alert and awake.  Patient will continue CPAP as needed and at night.  Hematology was consulted and recommended work-up for anemia.  CT abdomen was negative for retroperitoneal hematoma.  Hematology recommended outpatient work-up.  There were incidental finding of cystitis, colitis, some inflammatory process on the CT abdomen, Patient denied any symptoms. Patient has already received 5 days of cefepime  during hospitalization.  PT recommended outpatient PT.  Patient has been discharged home and will follow up with the nephrology gastroenterology and hematology as an  outpatient.  Patient is cleared from nephrology hematology and GI to be discharged.  He was managed for below problems.  Discharge Diagnoses:  Active Problems:   Altered mental state   Acute on chronic anemia  Acute encephalopathy that is multifactorial>>>> Resolved.   This likely is secondary to hypoglycemia as well as hypoxemia in addition to uremia with end-stage renal disease on peritoneal dialysis. -The patient admitted to a progressive unit bed. -Nephrologyconsulted, Patient underwent hemodialysis  Three days in a row.  -Hypoglycemia protocol will be followed. -Long-acting and high-dose insulin will be held off. -O2 protocol will be followed and BiPAP will be continued as needed especially with history of obstructive sleep apnea. - There was no evidence of sepsis, it was ruled out.  2. Acute on chronic diastolic CHFwith subsequent hypoxic and possibly hypercarbic acute respiratory failurecontributing to acute encephalopathy and worsening by his symptomatic anemia. - Continue BiPAP and monitor ABG as needed. -The patient will be gently diuresed with IV Lasix pending dialysis. -He had a recent 2D echo on 11/16/2019 that revealed an EF of 60 to 65% with grade 2 diastolic dysfunction.  3.Symptomatic anemia; Patient presented with aHbof 5.7,  He has received 5 units PRBC during hospitalization. GI consulted, Patient underwent upper GI endoscopy, which was unremarkable.  Patient underwent colonoscopy which was unremarkable.  GI recommended outpatient capsule endoscopy. Hb remains stable above 8.0  Hematology consulted, recommended CT Abdomen to r/o retro- peritoneal bleeding . Hematology recommended outpatient follow up.   3. Type II diabetes mellitus. -The patient will be placed on supplement coverage with NovoLog and will hold off U5 100 for now.  4. Hypertension. -Norvasc will be placed with holding parameters. -Elavil will be held off.  5. Atrial  fibrillation. -We will continue amiodarone and will resume Eliquis given upper and lower GI endoscopy is unremarkable.  6. DVT prophylaxis. -SCDs. -Medical prophylaxis currently held off pending stool Hemoccult given symptomatic anemia   Discharge Instructions  Discharge Instructions    Call MD for:  difficulty breathing, headache or visual disturbances   Complete by: As directed    Call MD for:  persistant dizziness or light-headedness   Complete by: As directed    Call MD for:  persistant nausea and vomiting   Complete by: As directed    Call MD for:  temperature >100.4   Complete by: As directed    Diet - low sodium heart healthy   Complete by: As directed    Diet Carb Modified   Complete by: As directed    Discharge instructions   Complete by: As directed    Advised to follow-up with primary care physician in 1 week. Patient admitted with acute encephalopathy secondary to fluid overload. Patient has received hemodialysis 3 days in a row renal functions improved. Advised to follow-up with nephrology and continue with peritoneal dialysis is advised. Advised to follow-up with GI in 3-4 weeks for capsule endoscopy. Advised to follow-up with hematology for anemia work-up.   Increase activity slowly   Complete by: As directed      Allergies as of 01/01/2020      Reactions   Mucinex [guaifenesin Er] Swelling   Throat swelling, increases heart rate   Levaquin [levofloxacin] Palpitations      Medication List    STOP taking these medications   amLODipine 5 MG tablet  Commonly known as: NORVASC   cephALEXin 500 MG capsule Commonly known as: KEFLEX     TAKE these medications   aerochamber plus with mask inhaler See admin instructions.   EasiVent inhaler See admin instructions.   amiodarone 200 MG tablet Commonly known as: PACERONE Take 200 mg by mouth daily.   amitriptyline 25 MG tablet Commonly known as: ELAVIL TAKE 1 TABLET AT BEDTIME   atorvastatin 40 MG  tablet Commonly known as: LIPITOR TAKE 1 TABLET DAILY   BD Insulin Syringe U/F 31G X 5/16" 1 ML Misc Generic drug: Insulin Syringe-Needle U-100   Breztri Aerosphere 160-9-4.8 MCG/ACT Aero Generic drug: Budeson-Glycopyrrol-Formoterol Inhale 2 puffs into the lungs in the morning and at bedtime.   calcitRIOL 0.5 MCG capsule Commonly known as: ROCALTROL Take 0.5 mcg by mouth daily.   cetirizine 10 MG tablet Commonly known as: ZYRTEC Take 10 mg by mouth daily as needed for allergies.   Combivent Respimat 20-100 MCG/ACT Aers respimat Generic drug: Ipratropium-Albuterol Inhale 2 puffs into the lungs every 4 (four) hours as needed for wheezing.   Eliquis 2.5 MG Tabs tablet Generic drug: apixaban Take 2.5 mg by mouth 2 (two) times daily.   gentamicin cream 0.1 % Commonly known as: GARAMYCIN Apply 1 application topically daily.   HUMULIN R 500 UNIT/ML injection Generic drug: insulin regular human CONCENTRATED Inject up to 25 units twice a day, or as directed by physician What changed:   how much to take  how to take this  when to take this  additional instructions   lactulose 10 GM/15ML solution Commonly known as: CHRONULAC Take 30 mLs by mouth daily.   levothyroxine 100 MCG tablet Commonly known as: SYNTHROID TAKE 1 TABLET DAILY What changed: when to take this   metoprolol tartrate 50 MG tablet Commonly known as: LOPRESSOR Take 0.5 tablets (25 mg total) by mouth 2 (two) times daily. What changed: Another medication with the same name was removed. Continue taking this medication, and follow the directions you see here.   pantoprazole 40 MG tablet Commonly known as: PROTONIX TAKE 1 TABLET TWICE A DAY What changed: when to take this   torsemide 10 MG tablet Commonly known as: DEMADEX Take 40 mg by mouth 2 (two) times daily.   Uloric 80 MG Tabs Generic drug: Febuxostat Take 80 mg by mouth daily.   vitamin C 1000 MG tablet Take 1,000 mg by mouth 2 (two) times  daily.            Durable Medical Equipment  (From admission, onward)         Start     Ordered   01/01/20 1237  DME Walker  Once       Question Answer Comment  Walker: With [place] Wheels   Patient needs a walker to treat with the following condition Weakness      01/01/20 1239   01/01/20 1136  For home use only DME Walker rolling  Once       Question Answer Comment  Walker: With [place] Wheels   Patient needs a walker to treat with the following condition Weakness      01/01/20 1136          Follow-up Information    [facility] Follow up on 01/10/2020.   Specialty: Cardiology Why: at 8:30am. Enter through the [facility] entrance Contact information: [place] [phone]  [name], MD Follow up in 1 week(s).   Specialty: Family Medicine Contact information: [address] [place] [address] [phone]        Cammie Sickle, MD Follow up in 1 week(s).   Specialties: Internal Medicine, Oncology Contact information: [place] [address] [phone]        Lin Landsman, MD Follow up in 2 week(s).   Specialty: Gastroenterology Contact information: [place] [address] [phone]              Allergies  Allergen Reactions  . Mucinex [Guaifenesin Er] Swelling    Throat swelling, increases heart rate  . Levaquin [Levofloxacin] Palpitations    Consultations:  Gastroenterology, Nephrology, PCCM.   Procedures/Studies: CT ABDOMEN PELVIS WO CONTRAST  Result Date: 12/31/2019 CLINICAL DATA:  Anemia, concern for retroperitoneal hemorrhage EXAM: CT ABDOMEN AND PELVIS WITHOUT CONTRAST TECHNIQUE: Multidetector CT imaging of the abdomen and pelvis was performed following the standard protocol without IV contrast. COMPARISON:  CT 03/13/2015 FINDINGS: Lower chest:  Clustered nodular opacities present in the left lung base (4/6) could reflect an acute infectious or inflammatory process. Some dependent atelectasis posteriorly. Cardiac size upper limits normal. Coronary artery calcifications are present. Calcifications seen in the mitral annulus and aortic leaflets. Hypoattenuation of the cardiac blood pool compatible with reported history of anemia. Hepatobiliary: Hepatomegaly. Mild macro lobularity of the hepatic capsule. Caudate and left lobe hypertrophy. Normal hepatic attenuation. No visible liver lesions on this unenhanced CT. Post cholecystectomy. Surgical clips in the gallbladder fossa. No significant biliary ductal dilatation or visible intraductal gallstones. Pancreas: Unremarkable. No pancreatic ductal dilatation or surrounding inflammatory changes. Spleen: Stable splenomegaly. No concerning splenic lesions. Small accessory splenule towards the inferior splenic tip. Adrenals/Urinary Tract: Mild lobular thickening of the adrenal glands, similar to comparison. Bilateral cortical scarring. Scattered subcentimeter fluid attenuation foci too small to fully characterize on CT imaging but statistically likely benign. No visible concerning renal lesions. No urolithiasis or hydronephrosis. Stable mild bilateral symmetric perinephric stranding, a nonspecific finding though may correlate with either age or decreased renal function. Urinary bladder is largely decompressed at the time of exam and therefore poorly evaluated by CT imaging. Circumferential bladder wall thickening is present, possibly related to underdistention though faint perivesicular haze is noted as well. Stomach/Bowel: Distal esophagus, stomach and duodenal sweep are unremarkable. No small bowel wall thickening or dilatation. No evidence of obstruction. A normal appendix is visualized. There may be some mild colonic wall thickening towards the cecum with adjacent pericolonic stranding. No other significant bowel  wall thickening or dilatation of the colon. Vascular/Lymphatic: Focal region of increasing mid mesenteric hazy stranding with numerous reactive appearing clustered mid mesenteric lymph nodes could reflect sequela of mesenteritis. No pathologically enlarged abdominopelvic lymph nodes. Atherosclerotic calcifications within the abdominal aorta and branch vessels. No aneurysm or ectasia. Reproductive: The prostate and seminal vesicles are unremarkable. Other: No abdominopelvic free fluid or free gas. No retroperitoneal or body wall hematoma. No bowel containing hernias. Peritoneal dialysis catheter tip terminates in the left lower quadrant. No complication is seen. Musculoskeletal: Transitional thoracolumbosacral anatomy. For prior numbering convention Rudimentary ribs versus unfused transverse processes at L1. Unchanged grade 1 anterolisthesis is seen at L5-S1 with associated bilateral L5 pars defects, the S1 level appearing partially lumbarized. Multilevel degenerative changes are present elsewhere in the imaged portions of the spine. No acute osseous abnormality or suspicious osseous lesion. IMPRESSION: 1. No evidence of retroperitoneal hematoma. 2. Clustered nodular  opacities in the left lung base could reflect an acute infectious or inflammatory process. 3. Circumferential bladder wall thickening is present, possibly related to underdistention though faint perivesicular haze could suggest an underlying cystitis. Correlate with urinalysis. 4. Focal region of increasing mid mesenteric hazy stranding could reflect sequela of mesenteritis. 5. There may be some mild colonic wall thickening towards the cecum with adjacent pericolonic stranding. Correlate for clinical features of infectious or inflammatory colitis. 6. Hepatosplenomegaly with a macrolobularity of the hepatic capsule and caudate and left lobe hypertrophy, suggestive of cirrhosis. 7. Transitional thoracolumbosacral anatomy. Unchanged grade 1 anterolisthesis  at L5-S1 with associated bilateral L5 pars defects, the S1 level appearing partially lumbarized. 8. Aortic Atherosclerosis (ICD10-I70.0). Electronically Signed   By: Lovena Le M.D.   On: 12/31/2019 20:17   US Venous Img Lower Bilateral (DVT)  Result Date: 12/28/2019 CLINICAL DATA:  Bilateral lower extremity pain and edema for the past months. Patient is currently on anticoagulation. Evaluate for DVT. EXAM: BILATERAL LOWER EXTREMITY VENOUS DOPPLER ULTRASOUND TECHNIQUE: Gray-scale sonography with graded compression, as well as color Doppler and duplex ultrasound were performed to evaluate the lower extremity deep venous systems from the level of the common femoral vein and including the common femoral, femoral, profunda femoral, popliteal and calf veins including the posterior tibial, peroneal and gastrocnemius veins when visible. The superficial great saphenous vein was also interrogated. Spectral Doppler was utilized to evaluate flow at rest and with distal augmentation maneuvers in the common femoral, femoral and popliteal veins. COMPARISON:  None FINDINGS: RIGHT LOWER EXTREMITY Common Femoral Vein: No evidence of thrombus. Normal compressibility, respiratory phasicity and response to augmentation. Saphenofemoral Junction: No evidence of thrombus. Normal compressibility and flow on color Doppler imaging. Profunda Femoral Vein: No evidence of thrombus. Normal compressibility and flow on color Doppler imaging. Femoral Vein: No evidence of thrombus. Normal compressibility, respiratory phasicity and response to augmentation. Popliteal Vein: No evidence of thrombus within either of the duplicated popliteal veins. Normal compressibility, respiratory phasicity and response to augmentation. Calf Veins: No evidence of thrombus. Normal compressibility and flow on color Doppler imaging. Superficial Great Saphenous Vein: No evidence of thrombus. Normal compressibility. Venous Reflux:  None. Other Findings:  None. LEFT  LOWER EXTREMITY Common Femoral Vein: No evidence of thrombus. Normal compressibility, respiratory phasicity and response to augmentation. Saphenofemoral Junction: No evidence of thrombus. Normal compressibility and flow on color Doppler imaging. Profunda Femoral Vein: No evidence of thrombus. Normal compressibility and flow on color Doppler imaging. Femoral Vein: No evidence of thrombus. Normal compressibility, respiratory phasicity and response to augmentation. Popliteal Vein: No evidence of thrombus. Normal compressibility, respiratory phasicity and response to augmentation. Calf Veins: No evidence of thrombus. Normal compressibility and flow on color Doppler imaging. Superficial Great Saphenous Vein: No evidence of thrombus. Normal compressibility. Venous Reflux:  None. Other Findings:  None. IMPRESSION: No evidence of DVT within either lower extremity. Electronically Signed   By: Sandi Mariscal M.D.   On: 12/28/2019 12:49   DG Chest Portable 1 View  Result Date: 12/27/2019 CLINICAL DATA:  64 year old male with shortness of breath. EXAM: PORTABLE CHEST 1 VIEW COMPARISON:  Chest radiograph dated 11/29/2019. FINDINGS: Right-sided dialysis catheter in similar position. There is cardiomegaly with vascular congestion and mild edema, new since the prior radiograph. No focal consolidation, pleural effusion, or pneumothorax. No acute osseous pathology. IMPRESSION: Cardiomegaly with findings of CHF, new since the prior radiograph. No focal consolidation. Electronically Signed   By: Anner Crete M.D.   On: 12/27/2019 01:17  VAS US DUPLEX DIALYSIS ACCESS (AVF, AVG)  Result Date: 12/25/2019 DIALYSIS ACCESS Reason for Exam: First look. Access Site: Left Upper Extremity. Access Type: Radial-cephalic AVF. History: Created 11/19/2019. Performing Technologist: Concha Norway RVT  Examination Guidelines: A complete evaluation includes B-mode imaging, spectral Doppler, color Doppler, and power Doppler as needed of all  accessible portions of each vessel. Unilateral testing is considered an integral part of a complete examination. Limited examinations for reoccurring indications may be performed as noted.  Findings: +--------------------+----------+-----------------+--------+ AVF                 PSV (cm/s)Flow Vol (mL/min)Comments +--------------------+----------+-----------------+--------+ Native artery inflow   210          1279                +--------------------+----------+-----------------+--------+ AVF Anastomosis        233                              +--------------------+----------+-----------------+--------+  +------------+----------+-------------+----------+--------+ OUTFLOW VEINPSV (cm/s)Diameter (cm)Depth (cm)Describe +------------+----------+-------------+----------+--------+ Dist UA         87        0.90                        +------------+----------+-------------+----------+--------+ AC Fossa       106        0.86                        +------------+----------+-------------+----------+--------+ Prox Forearm    63                                    +------------+----------+-------------+----------+--------+ Mid Forearm     73        0.54                        +------------+----------+-------------+----------+--------+ Dist Forearm   167        0.37                        +------------+----------+-------------+----------+--------+  +------------------+-------------+----------+---------+----------+-------------+                   Diameter (cm)Depth (cm)BranchingPSV (cm/s) Flow Volume                                                                (ml/min)    +------------------+-------------+----------+---------+----------+-------------+ Left Rad Art                                          75                  Distal To anast                                                            +------------------+-------------+----------+---------+----------+-------------+  Bidirectional                                                             +------------------+-------------+----------+---------+----------+-------------+  Summary: Patent new left radiocephalic AVF with no evidence of stenosis. Normal flow seen throughout.  *See table(s) above for measurements and observations.  Diagnosing physician: Leotis Pain MD Electronically signed by Leotis Pain MD on 12/25/2019 at 1:59:03 PM.    --------------------------------------------------------------------------------   Final     EGD, colonoscopy, hemodialysis 3 times.   Subjective: Patient was seen and examined at bedside. No overnight events. Patient feels much better,  patient has ambulated, denies any shortness of breath.  Wants to be discharged.  Discharge Exam: Vitals:   01/01/20 0830 01/01/20 1137  BP: (!) 156/59 (!) 165/93  Pulse: 69 65  Resp: 19 18  Temp: 98 F (36.7 C) 98 F (36.7 C)  SpO2: 94% 93%   Vitals:   01/01/20 0434 01/01/20 0500 01/01/20 0830 01/01/20 1137  BP: (!) 143/54  (!) 156/59 (!) 165/93  Pulse: (!) 58  69 65  Resp: 18  19 18   Temp: 97.6 F (36.4 C)  98 F (36.7 C) 98 F (36.7 C)  TempSrc:   Oral Oral  SpO2: 97%  94% 93%  Weight:  134.6 kg    Height:        General: Pt is alert, awake, not in acute distress Cardiovascular: RRR, S1/S2 +, no rubs, no gallops Respiratory: CTA bilaterally, no wheezing, no rhonchi Abdominal: Soft, NT, ND, bowel sounds + Extremities: no edema, no cyanosis    The results of significant diagnostics from this hospitalization (including imaging, microbiology, ancillary and laboratory) are listed below for reference.     Microbiology: Recent Results (from the past 240 hour(s))  SARS Coronavirus 2 by RT PCR (hospital order, performed in [facility] hospital lab) Nasopharyngeal Nasopharyngeal Swab     Status: None   Collection Time: 12/27/19 12:54 AM    Specimen: Nasopharyngeal Swab  Result Value Ref Range Status   SARS Coronavirus 2 NEGATIVE NEGATIVE Final    Comment: (NOTE) SARS-CoV-2 target nucleic acids are NOT DETECTED.  The SARS-CoV-2 RNA is generally detectable in upper and lower respiratory specimens during the acute phase of infection. The lowest concentration of SARS-CoV-2 viral copies this assay can detect is 250 copies / mL. A negative result does not preclude SARS-CoV-2 infection and should not be used as the sole basis for treatment or other patient management decisions.  A negative result may occur with improper specimen collection / handling, submission of specimen other than nasopharyngeal swab, presence of viral mutation(s) within the areas targeted by this assay, and inadequate number of viral copies (<250 copies / mL). A negative result must be combined with clinical observations, patient history, and epidemiological information.  Fact Sheet for Patients:   StrictlyIdeas.no  Fact Sheet for Healthcare Providers: BankingDealers.co.za  This test is not yet approved or  cleared by the [place] FDA and has been authorized for detection and/or diagnosis of SARS-CoV-2 by FDA under an Emergency Use Authorization (EUA).  This EUA will remain in effect (meaning this test can be used) for the duration of the COVID-19 declaration under Section 564(b)(1) of the Act, 21 U.S.C. section 360bbb-3(b)(1), unless the authorization is terminated or revoked sooner.  Performed  at [facility], [address] [place], [place] [address]   Blood culture (routine x 2)     Status: None   Collection Time: 12/27/19  3:07 AM   Specimen: BLOOD  Result Value Ref Range Status   Specimen Description BLOOD RIGHT HAND  Final   Special Requests   Final    BOTTLES DRAWN AEROBIC AND ANAEROBIC Blood Culture adequate volume   Culture   Final    NO GROWTH 5 DAYS Performed at [facility], [address] [place], [place] [address]    Report Status 01/01/2020 FINAL  Final  Blood culture (routine x 2)     Status: None   Collection Time: 12/27/19  3:07 AM   Specimen: BLOOD  Result Value Ref Range Status   Specimen Description BLOOD RIGHT [facility]  Final   Special Requests   Final    BOTTLES DRAWN AEROBIC AND ANAEROBIC Blood Culture adequate volume   Culture   Final    NO GROWTH 5 DAYS Performed at [facility], [address], [place], [place] [address]    Report Status 01/01/2020 FINAL  Final  MRSA PCR Screening     Status: None   Collection Time: 12/27/19  3:45 PM   Specimen: Nasopharyngeal  Result Value Ref Range Status   MRSA by PCR NEGATIVE NEGATIVE Final    Comment:        The GeneXpert MRSA Assay (FDA approved for NASAL specimens only), is one component of a comprehensive MRSA colonization surveillance program. It is not intended to diagnose MRSA infection nor to guide or monitor treatment for MRSA infections. Performed at [facility], [address] [place], [place] [address]      Labs: BNP (last 3 results) Recent Labs    11/15/19 1728 11/29/19 1559 12/27/19 0054  BNP 221.3* 29.1 923.3*   Basic Metabolic Panel: Recent Labs  Lab 12/28/19 0953 12/29/19 1035 12/30/19 0521 12/31/19 0553 01/01/20 0427  NA 135 139 133* 137 135  K 5.0 4.4 3.4* 3.9 3.7  CL 92* 92* 91* 97* 97*  CO2 26 30 32 28 27  GLUCOSE 161* 135* 105* 142* 167*  BUN 57* 41* 26* 40* 26*  CREATININE 10.52* 7.99* 6.15* 8.25* 6.12*  CALCIUM 6.7* 7.1* 7.2* 7.2* 7.5*  MG 1.7 1.8 1.7 2.1 1.9  PHOS 7.8* 5.7* 4.6 6.3* 4.3   Liver Function Tests: Recent Labs  Lab 12/27/19 0054 01/01/20 0427  AST 15 23  ALT 18 22  ALKPHOS 114 92  BILITOT 0.9 0.8  PROT 7.6 6.4*  ALBUMIN 3.2* 2.7*   Recent Labs  Lab 12/27/19 0054  LIPASE 29   No results for input(s): AMMONIA in the last 168 hours. CBC: Recent Labs  Lab 12/27/19 0054 12/27/19 0426  12/28/19 0953 12/28/19 1727 12/29/19 0036 12/29/19 1035 12/30/19 0521 12/31/19 0553 01/01/20 0427  WBC 9.4   < > 7.4  --   --  7.3 5.7 5.6 5.4  NEUTROABS 7.1  --   --   --   --   --   --   --  3.6  HGB 5.7*   < > 6.6*   < > 6.9* 6.6* 8.1* 7.7* 8.0*  HCT 17.9*   < > 19.1*   < > 20.7* 19.8* 23.6* 23.2* 23.5*  MCV 96.2   < > 90.1  --   --  93.0 90.4 93.5 91.1  PLT 109*   < > 117*  --   --  105* 104* 112* 117*   < > = values in this interval not displayed.   Cardiac Enzymes: No results for input(s): CKTOTAL, CKMB, CKMBINDEX, TROPONINI in the last 168 hours. BNP: Invalid input(s): POCBNP CBG: Recent Labs  Lab 12/31/19 0800 12/31/19 1639 12/31/19 2114 01/01/20 0828 01/01/20 1136  GLUCAP 139* 236* 216* 191* 307*   D-Dimer No results for input(s): DDIMER in the last 72 hours. Hgb A1c No results for input(s): HGBA1C in the last 72 hours. Lipid Profile No results for input(s): CHOL, HDL, LDLCALC, TRIG, CHOLHDL, LDLDIRECT in the last 72 hours. Thyroid function studies No results for input(s): TSH, T4TOTAL, T3FREE, THYROIDAB in the last 72 hours.  Invalid input(s): FREET3 Anemia work up Recent Labs    01/01/20 0427  VITAMINB12 642  FOLATE 7.3  RETICCTPCT 4.3*   Urinalysis    Component Value Date/Time   COLORURINE STRAW (A) 11/19/2017 1437   APPEARANCEUR CLEAR (A) 11/19/2017 1437   APPEARANCEUR Clear 03/13/2012 1011   LABSPEC 1.008 11/19/2017 1437   LABSPEC 1.015 03/13/2012 1011   PHURINE 5.0 11/19/2017 1437   GLUCOSEU NEGATIVE 11/19/2017 1437   GLUCOSEU Negative 03/13/2012 1011   HGBUR NEGATIVE 11/19/2017 1437   BILIRUBINUR NEGATIVE 11/19/2017 1437   BILIRUBINUR Negative 03/13/2012 [place] 11/19/2017 1437   PROTEINUR 30 (A) 11/19/2017 1437   NITRITE NEGATIVE 11/19/2017 1437   LEUKOCYTESUR NEGATIVE 11/19/2017 1437   LEUKOCYTESUR Negative 03/13/2012 1011   Sepsis Labs Invalid input(s): PROCALCITONIN,  WBC,  LACTICIDVEN Microbiology Recent  Results (from the past 240 hour(s))  SARS Coronavirus 2 by RT PCR (hospital order, performed in [facility] hospital lab) Nasopharyngeal Nasopharyngeal Swab     Status: None   Collection Time: 12/27/19 12:54 AM   Specimen: Nasopharyngeal Swab  Result Value Ref Range Status   SARS Coronavirus 2 NEGATIVE NEGATIVE Final    Comment: (NOTE) SARS-CoV-2 target nucleic acids are NOT DETECTED.  The SARS-CoV-2 RNA is generally detectable in upper and lower respiratory specimens during the acute phase of infection. The lowest concentration of SARS-CoV-2 viral copies this assay can detect is 250 copies / mL. A negative result does not preclude SARS-CoV-2 infection and should not be used as the sole basis for treatment or other patient management decisions.  A negative result may occur with improper specimen collection / handling, submission of specimen other than nasopharyngeal swab, presence of viral mutation(s) within the areas targeted by this assay, and inadequate number of viral copies (<250 copies / mL). A negative result must be combined with clinical observations, patient history, and epidemiological information.  Fact Sheet for Patients:   StrictlyIdeas.no  Fact Sheet for Healthcare Providers: BankingDealers.co.za  This test is not yet approved or  cleared by the [place] FDA and has been authorized for detection and/or diagnosis of SARS-CoV-2 by FDA under an Emergency Use Authorization (EUA).  This EUA will remain in effect (meaning this test can be used) for the duration of the COVID-19 declaration under Section 564(b)(1) of the Act, 21 U.S.C. section 360bbb-3(b)(1), unless the authorization is terminated or revoked sooner.  Performed at [facility], [address] [place], [place] [address]   Blood culture (routine x 2)     Status: None   Collection Time: 12/27/19  3:07 AM   Specimen: BLOOD  Result Value Ref Range  Status   Specimen Description BLOOD RIGHT HAND  Final   Special Requests   Final    BOTTLES DRAWN AEROBIC AND ANAEROBIC Blood Culture adequate  volume   Culture   Final    NO GROWTH 5 DAYS Performed at [facility], [address] [place], [place] [address]    Report Status 01/01/2020 FINAL  Final  Blood culture (routine x 2)     Status: None   Collection Time: 12/27/19  3:07 AM   Specimen: BLOOD  Result Value Ref Range Status   Specimen Description BLOOD RIGHT [facility]  Final   Special Requests   Final    BOTTLES DRAWN AEROBIC AND ANAEROBIC Blood Culture adequate volume   Culture   Final    NO GROWTH 5 DAYS Performed at [facility], [address] [place], [place] [address]    Report Status 01/01/2020 FINAL  Final  MRSA PCR Screening     Status: None   Collection Time: 12/27/19  3:45 PM   Specimen: Nasopharyngeal  Result Value Ref Range Status   MRSA by PCR NEGATIVE NEGATIVE Final    Comment:        The GeneXpert MRSA Assay (FDA approved for NASAL specimens only), is one component of a comprehensive MRSA colonization surveillance program. It is not intended to diagnose MRSA infection nor to guide or monitor treatment for MRSA infections. Performed at [facility], [address], [place], [place] [address]      Time coordinating discharge: Over 30 minutes  SIGNED:   Shawna Clamp, MD  [facility] 01/01/2020, 12:41 PM Pager   If 7PM-7AM, please contact night-coverage [email or web address]

## 2020-01-01 NOTE — Care Management CC44 (Signed)
Condition Code 44 Documentation Completed  Patient Details  Name: MIKEY MAFFETT MRN: [id number] Date of Birth: Jan 10, 1956   Condition Code 44 given:  Yes Patient signature on Condition Code 44 notice:  Yes Documentation of 2 MD's agreement:  Yes Code 44 added to claim:  Yes  Delivered message via phone to [name].   Victorino Dike, RN 01/01/2020, 11:49 AM

## 2020-01-01 NOTE — Plan of Care (Signed)

## 2020-01-01 NOTE — Plan of Care (Signed)
  Problem: Education: Goal: Knowledge of General Education information will improve Description: Including pain rating scale, medication(s)/side effects and non-pharmacologic comfort measures 01/01/2020 1253 by Claiborne Billings, RN Outcome: Adequate for Discharge 01/01/2020 1039 by Claiborne Billings, RN Outcome: Progressing   Problem: Health Behavior/Discharge Planning: Goal: Ability to manage health-related needs will improve 01/01/2020 1253 by Claiborne Billings, RN Outcome: Adequate for Discharge 01/01/2020 1039 by Claiborne Billings, RN Outcome: Progressing   Problem: Clinical Measurements: Goal: Ability to maintain clinical measurements within normal limits will improve 01/01/2020 1253 by Claiborne Billings, RN Outcome: Adequate for Discharge 01/01/2020 1039 by Claiborne Billings, RN Outcome: Progressing Goal: Will remain free from infection 01/01/2020 1253 by Claiborne Billings, RN Outcome: Adequate for Discharge 01/01/2020 1039 by Claiborne Billings, RN Outcome: Progressing Goal: Diagnostic test results will improve 01/01/2020 1253 by Claiborne Billings, RN Outcome: Adequate for Discharge 01/01/2020 1039 by Claiborne Billings, RN Outcome: Progressing Goal: Respiratory complications will improve 01/01/2020 1253 by Claiborne Billings, RN Outcome: Adequate for Discharge 01/01/2020 1039 by Claiborne Billings, RN Outcome: Progressing Goal: Cardiovascular complication will be avoided 01/01/2020 1253 by Claiborne Billings, RN Outcome: Adequate for Discharge 01/01/2020 1039 by Claiborne Billings, RN Outcome: Progressing   Problem: Activity: Goal: Risk for activity intolerance will decrease 01/01/2020 1253 by Claiborne Billings, RN Outcome: Adequate for Discharge 01/01/2020 1039 by Claiborne Billings, RN Outcome: Progressing   Problem: Nutrition: Goal: Adequate nutrition will be maintained 01/01/2020 1253 by Claiborne Billings, RN Outcome: Adequate for Discharge 01/01/2020 1039 by Claiborne Billings, RN Outcome: Progressing   Problem:  Coping: Goal: Level of anxiety will decrease 01/01/2020 1253 by Claiborne Billings, RN Outcome: Adequate for Discharge 01/01/2020 1039 by Claiborne Billings, RN Outcome: Progressing   Problem: Elimination: Goal: Will not experience complications related to bowel motility 01/01/2020 1253 by Claiborne Billings, RN Outcome: Adequate for Discharge 01/01/2020 1039 by Claiborne Billings, RN Outcome: Progressing Goal: Will not experience complications related to urinary retention 01/01/2020 1253 by Claiborne Billings, RN Outcome: Adequate for Discharge 01/01/2020 1039 by Claiborne Billings, RN Outcome: Progressing   Problem: Pain Managment: Goal: General experience of comfort will improve 01/01/2020 1253 by Claiborne Billings, RN Outcome: Adequate for Discharge 01/01/2020 1039 by Claiborne Billings, RN Outcome: Progressing   Problem: Safety: Goal: Ability to remain free from injury will improve 01/01/2020 1253 by Claiborne Billings, RN Outcome: Adequate for Discharge 01/01/2020 1039 by Claiborne Billings, RN Outcome: Progressing   Problem: Skin Integrity: Goal: Risk for impaired skin integrity will decrease 01/01/2020 1253 by Claiborne Billings, RN Outcome: Adequate for Discharge 01/01/2020 1039 by Claiborne Billings, RN Outcome: Progressing

## 2020-01-01 NOTE — Progress Notes (Signed)
Patient ready for discharge, iv removed, tele removed. AVS given to patient and wife, no questions comments or concerns.

## 2020-01-02 ENCOUNTER — Telehealth: Payer: Self-pay | Admitting: Family Medicine

## 2020-01-02 ENCOUNTER — Encounter (INDEPENDENT_AMBULATORY_CARE_PROVIDER_SITE_OTHER): Payer: Medicare Other

## 2020-01-02 ENCOUNTER — Telehealth: Payer: Self-pay

## 2020-01-02 ENCOUNTER — Ambulatory Visit (INDEPENDENT_AMBULATORY_CARE_PROVIDER_SITE_OTHER): Payer: Medicare Other | Admitting: Nurse Practitioner

## 2020-01-02 LAB — HAPTOGLOBIN: Haptoglobin: 354 mg/dL (ref 32–363)

## 2020-01-02 LAB — KAPPA/LAMBDA LIGHT CHAINS
Kappa free light chain: 222.8 mg/L — ABNORMAL HIGH (ref 3.3–19.4)
Kappa, lambda light chain ratio: 1.36 (ref 0.26–1.65)
Lambda free light chains: 163.9 mg/L — ABNORMAL HIGH (ref 5.7–26.3)

## 2020-01-02 NOTE — Telephone Encounter (Signed)
C- Please schedule apts

## 2020-01-02 NOTE — Telephone Encounter (Signed)
Patient has hospital follow up scheduled 9-20, but he really needs to be seen within 2 weeks of discharge. Can you see if had come in on 01-14-2020 at 3:40 instead. Its ok to open the blocked slot for hospital discharge. Thanks.

## 2020-01-02 NOTE — Telephone Encounter (Signed)
  Patient was recently discharged from the hospital on 01/01/20.  No TCM completed, patient does not qualify for TCM services due to having ESRD and being on dialysis.  Per discharge summary patient needs follow up with PCP. HFU scheduled 01/21/20 @ 10:00 AM.

## 2020-01-02 NOTE — Telephone Encounter (Signed)
Lab orders placed.  

## 2020-01-02 NOTE — Telephone Encounter (Signed)
Patient is going out of town from 01/13/20-01/19/20.   Wife advised to come 01/21/20 at 10:00.

## 2020-01-02 NOTE — Addendum Note (Signed)
Addended by: Delice Bison E on: 01/02/2020 08:25 AM   Modules accepted: Orders

## 2020-01-03 LAB — MULTIPLE MYELOMA PANEL, SERUM
Albumin SerPl Elph-Mcnc: 2.7 g/dL — ABNORMAL LOW (ref 2.9–4.4)
Albumin/Glob SerPl: 0.9 (ref 0.7–1.7)
Alpha 1: 0.4 g/dL (ref 0.0–0.4)
Alpha2 Glob SerPl Elph-Mcnc: 1 g/dL (ref 0.4–1.0)
B-Globulin SerPl Elph-Mcnc: 0.7 g/dL (ref 0.7–1.3)
Gamma Glob SerPl Elph-Mcnc: 1.1 g/dL (ref 0.4–1.8)
Globulin, Total: 3.1 g/dL (ref 2.2–3.9)
IgA: 208 mg/dL (ref 61–437)
IgG (Immunoglobin G), Serum: 1061 mg/dL (ref 603–1613)
IgM (Immunoglobulin M), Srm: 66 mg/dL (ref 20–172)
Total Protein ELP: 5.8 g/dL — ABNORMAL LOW (ref 6.0–8.5)

## 2020-01-03 NOTE — Telephone Encounter (Signed)
I had a cancellation Tuesday September 7th at 10am and stuck him that slot before it got gone. Can you see if patient can make that appt time.

## 2020-01-04 ENCOUNTER — Other Ambulatory Visit: Payer: Self-pay

## 2020-01-04 ENCOUNTER — Encounter: Payer: Self-pay | Admitting: Internal Medicine

## 2020-01-04 ENCOUNTER — Telehealth: Payer: Self-pay

## 2020-01-04 NOTE — Telephone Encounter (Signed)
Please advise. I tried calling patient to find out what time his other appointments are. Left message to call back.  From what I can see in Epic, he has an appointment with oncology at 8:15 and 8:30am.

## 2020-01-04 NOTE — Telephone Encounter (Signed)
Patient returned call. He states he has an appointment with the cancer center at 8:15 and 8:30. He says he is losing blood and they are going to run labs and check to see if he need to be given blood. Patient doesn't know how long that appointment may take and he feels that he really needs to keep that appointment since he is losing blood. Patient wants to know if the appointment with Dr. Caryn Section can be adjusted or rescheduled to a later time?

## 2020-01-04 NOTE — Telephone Encounter (Signed)
Copied from [place] [id number]. Topic: General - Other >> Jan 04, 2020  9:23 AM Yvette Rack wrote: Reason for CRM: Pt stated he has another appt on 01/08/20 and he would like to know if the time could be adjusted. Pt stated he has been losing blood and really needs to attend the other appt. Pt requests call back. Cb# [phone]

## 2020-01-04 NOTE — Telephone Encounter (Addendum)
I called patient and he agrees to come to appointment on Tuesday 01/08/2020 at 10am.

## 2020-01-04 NOTE — Progress Notes (Signed)
Established patient visit   Patient: Patrick Hurst   DOB: [birth year]   64 y.o. Male  MRN: [id number] Visit Date: 01/08/2020  Today's healthcare provider: Lelon Huh, MD   Chief Complaint  Patient presents with  . Hospitalization Follow-up   Subjective    HPI  Follow up Hospitalization  Patient was admitted to [facility] on 12/27/2019 and discharged on 01/01/2020. He was treated for acute on chronic respiratory failure with hypoxia and hypercapnia. Treatment for this included -see discharge note. Telephone follow up was not done. He reports good compliance with treatment. Per discharge summary patient is to have BMP/CBC checked one week after discharge. Patient was advised to follow-up with Nephrology and continue with peritoneal dialysis as advised. Advised to follow-up with GI in 3-4 weeks for capsule endoscopy. Advised to follow-up with hematology for anemia work-up. Has just started EPO injections by nephrology.  He reports this condition is improved. States he feels better since discharge than he has in years.  He was seen by Dr. Rogue Bussing this morning for follow up of anemia and found to have hemoglobin of 8.6. He is scheduled to have capsule endoscopy later this month.   His last a1c was 5.8 on 11/12/2019. He has long been off of basal insulin and has cut back his insulin before supper to about 20 units since he was having some lows after supper. He is still taking 30 units before breakfast.   He was previously followed by Dr. Honor Junes before Dr. Honor Junes left [facility] endocrinoloby. Patient requests referral to get re-established since he is now local.       Medications: Outpatient Medications Prior to Visit  Medication Sig  . amiodarone (PACERONE) 200 MG tablet Take 200 mg by mouth daily.  Marland Kitchen amitriptyline (ELAVIL) 25 MG tablet TAKE 1 TABLET AT BEDTIME (Patient taking differently: Take 25 mg by mouth at bedtime. )  . Ascorbic Acid (VITAMIN C) 1000 MG tablet Take 1,000 mg  by mouth 2 (two) times daily.   Marland Kitchen atorvastatin (LIPITOR) 40 MG tablet TAKE 1 TABLET DAILY (Patient taking differently: Take 40 mg by mouth daily. )  . BD INSULIN SYRINGE U/F 31G X 5/16" 1 ML MISC   . Budeson-Glycopyrrol-Formoterol (BREZTRI AEROSPHERE) 160-9-4.8 MCG/ACT AERO Inhale 2 puffs into the lungs in the morning and at bedtime.   . calcitRIOL (ROCALTROL) 0.5 MCG capsule Take 0.5 mcg by mouth daily.  . cetirizine (ZYRTEC) 10 MG tablet Take 10 mg by mouth daily as needed for allergies.   Marland Kitchen ELIQUIS 2.5 MG TABS tablet Take 2.5 mg by mouth 2 (two) times daily.   Marland Kitchen gentamicin cream (GARAMYCIN) 0.1 % Apply 1 application topically daily.  . insulin regular human CONCENTRATED (HUMULIN R) 500 UNIT/ML injection Inject up to 25 units twice a day, or as directed by physician (Patient taking differently: Inject 50 Units into the skin 2 (two) times daily with a meal. 60 UNITS IN THE AM & 50 UNITS AT NIGHT)  . lactulose (CHRONULAC) 10 GM/15ML solution Take 30 mLs by mouth daily.  Marland Kitchen levothyroxine (SYNTHROID) 100 MCG tablet TAKE 1 TABLET DAILY (Patient taking differently: Take 100 mcg by mouth daily before breakfast. )  . metoprolol tartrate (LOPRESSOR) 50 MG tablet Take 0.5 tablets (25 mg total) by mouth 2 (two) times daily.  . pantoprazole (PROTONIX) 40 MG tablet TAKE 1 TABLET TWICE A DAY (Patient taking differently: Take 40 mg by mouth daily. )  . Spacer/Aero-Holding Chambers (AEROCHAMBER PLUS WITH MASK) inhaler See admin  instructions.  Marland Kitchen Spacer/Aero-Holding Chambers (EASIVENT) inhaler See admin instructions.  . torsemide (DEMADEX) 10 MG tablet Take 40 mg by mouth 2 (two) times daily.   Marland Kitchen ULORIC 80 MG TABS Take 80 mg by mouth daily.   . Ipratropium-Albuterol (COMBIVENT RESPIMAT) 20-100 MCG/ACT AERS respimat Inhale 2 puffs into the lungs every 4 (four) hours as needed for wheezing.  (Patient not taking: Reported on 01/08/2020)   No facility-administered medications prior to visit.    Review of Systems    Constitutional: Negative.   Respiratory: Positive for shortness of breath.   Cardiovascular: Negative.   Gastrointestinal: Negative.   Musculoskeletal: Negative.   Neurological: Negative.       Objective    BP (!) 146/72 (BP Location: Right Arm)   Pulse 67   Temp 98.3 F (36.8 C)   Ht 5\' 11"  (1.803 m)   Wt (!) 305 lb (138.3 kg)   SpO2 95%   BMI 42.54 kg/m    Physical Exam  General appearance: Obese male, cooperative and in no acute distress Head: Normocephalic, without obvious abnormality, atraumatic Respiratory: Respirations even and unlabored, normal respiratory rate Extremities: All extremities are intact.  Skin: Skin color, texture, turgor normal. No rashes seen  Psych: Appropriate mood and affect. Neurologic: Mental status: Alert, oriented to person, place, and time, thought content appropriate.    Assessment & Plan     1. ESRD on dialysis [facility]) Continue routine follow up with renal  2. 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 However, he would still like to re-establish with Dr. Honor Junes who had a good report with in the past.  - Ambulatory referral to Endocrinology  3. Need for influenza vaccination  - Flu Vaccine QUAD 36+ mos IM (Fluarix/Fluzone)  4. Anemia due to chronic kidney disease, on chronic dialysis (HCC) CBC improving, now on EPO. No source of bleeding on colonoscopy and endoscopy. Planning on capsule endoscopy later this month.   5. Morbid (severe) obesity due to excess calories ([place]) Working on improving diet to lose weigh.    Follow up BP and general check up in 3 months.       The entirety of the information documented in the History of Present Illness, Review of Systems and Physical Exam were personally obtained by me. Portions of this  information were initially documented by the CMA and reviewed by me for thoroughness and accuracy.      Lelon Huh, MD  [facility] [phone] (phone) [phone] (fax)  [place]

## 2020-01-04 NOTE — Telephone Encounter (Signed)
Patient advised and agrees to come right after her is done with his oncology/ hematology appointment.

## 2020-01-04 NOTE — Telephone Encounter (Signed)
Patient called triage. He acknowledge my phone call. He is aware of his apts times and agreeable.

## 2020-01-04 NOTE — Telephone Encounter (Signed)
That's OK. If he's not here by 10 we can see him whenever he gets here. Schedules not to busy the rest of the morning on the 7th.

## 2020-01-04 NOTE — Telephone Encounter (Signed)
Spoke with Patrick Hurst in [place]. The best day for possible 1 unit of blood in SDS dept is on Tuesday. Dr. Rogue Bussing made aware. Will add patient on the lab schedule on Tuesday at 8:15 and to see Dr. Rogue Bussing at 31 am. I left a vm for patient to return my phone call.  Colette, please add patient's appointments in [place]. Thanks. Nira Conn

## 2020-01-08 ENCOUNTER — Encounter: Payer: Self-pay | Admitting: Family Medicine

## 2020-01-08 ENCOUNTER — Ambulatory Visit: Admission: RE | Admit: 2020-01-08 | Payer: Commercial Managed Care - PPO | Source: Ambulatory Visit

## 2020-01-08 ENCOUNTER — Ambulatory Visit (INDEPENDENT_AMBULATORY_CARE_PROVIDER_SITE_OTHER): Payer: Commercial Managed Care - PPO | Admitting: Family Medicine

## 2020-01-08 ENCOUNTER — Inpatient Hospital Stay: Payer: Commercial Managed Care - PPO | Attending: Internal Medicine

## 2020-01-08 ENCOUNTER — Inpatient Hospital Stay (HOSPITAL_BASED_OUTPATIENT_CLINIC_OR_DEPARTMENT_OTHER): Payer: Commercial Managed Care - PPO | Admitting: Internal Medicine

## 2020-01-08 ENCOUNTER — Other Ambulatory Visit: Payer: Self-pay

## 2020-01-08 VITALS — BP 146/72 | HR 67 | Temp 98.3°F | Ht 71.0 in | Wt 305.0 lb

## 2020-01-08 DIAGNOSIS — F329 Major depressive disorder, single episode, unspecified: Secondary | ICD-10-CM | POA: Insufficient documentation

## 2020-01-08 DIAGNOSIS — D631 Anemia in chronic kidney disease: Secondary | ICD-10-CM

## 2020-01-08 DIAGNOSIS — Z992 Dependence on renal dialysis: Secondary | ICD-10-CM | POA: Diagnosis not present

## 2020-01-08 DIAGNOSIS — D6959 Other secondary thrombocytopenia: Secondary | ICD-10-CM | POA: Diagnosis not present

## 2020-01-08 DIAGNOSIS — K7460 Unspecified cirrhosis of liver: Secondary | ICD-10-CM | POA: Diagnosis not present

## 2020-01-08 DIAGNOSIS — D696 Thrombocytopenia, unspecified: Secondary | ICD-10-CM

## 2020-01-08 DIAGNOSIS — G4730 Sleep apnea, unspecified: Secondary | ICD-10-CM | POA: Diagnosis not present

## 2020-01-08 DIAGNOSIS — Z79899 Other long term (current) drug therapy: Secondary | ICD-10-CM | POA: Diagnosis not present

## 2020-01-08 DIAGNOSIS — Z794 Long term (current) use of insulin: Secondary | ICD-10-CM | POA: Diagnosis not present

## 2020-01-08 DIAGNOSIS — Z8249 Family history of ischemic heart disease and other diseases of the circulatory system: Secondary | ICD-10-CM | POA: Insufficient documentation

## 2020-01-08 DIAGNOSIS — D649 Anemia, unspecified: Secondary | ICD-10-CM

## 2020-01-08 DIAGNOSIS — Z23 Encounter for immunization: Secondary | ICD-10-CM

## 2020-01-08 DIAGNOSIS — Z87891 Personal history of nicotine dependence: Secondary | ICD-10-CM | POA: Diagnosis not present

## 2020-01-08 DIAGNOSIS — N186 End stage renal disease: Secondary | ICD-10-CM

## 2020-01-08 DIAGNOSIS — E113593 Type 2 diabetes mellitus with proliferative diabetic retinopathy without macular edema, bilateral: Secondary | ICD-10-CM | POA: Diagnosis not present

## 2020-01-08 DIAGNOSIS — E119 Type 2 diabetes mellitus without complications: Secondary | ICD-10-CM | POA: Insufficient documentation

## 2020-01-08 LAB — CBC WITH DIFFERENTIAL/PLATELET
Abs Immature Granulocytes: 0.07 10*3/uL (ref 0.00–0.07)
Basophils Absolute: 0.1 10*3/uL (ref 0.0–0.1)
Basophils Relative: 2 %
Eosinophils Absolute: 0.1 10*3/uL (ref 0.0–0.5)
Eosinophils Relative: 2 %
HCT: 26.1 % — ABNORMAL LOW (ref 39.0–52.0)
Hemoglobin: 8.6 g/dL — ABNORMAL LOW (ref 13.0–17.0)
Immature Granulocytes: 1 %
Lymphocytes Relative: 9 %
Lymphs Abs: 0.5 10*3/uL — ABNORMAL LOW (ref 0.7–4.0)
MCH: 30.7 pg (ref 26.0–34.0)
MCHC: 33 g/dL (ref 30.0–36.0)
MCV: 93.2 fL (ref 80.0–100.0)
Monocytes Absolute: 0.5 10*3/uL (ref 0.1–1.0)
Monocytes Relative: 8 %
Neutro Abs: 4.6 10*3/uL (ref 1.7–7.7)
Neutrophils Relative %: 78 %
Platelets: 104 10*3/uL — ABNORMAL LOW (ref 150–400)
RBC: 2.8 MIL/uL — ABNORMAL LOW (ref 4.22–5.81)
RDW: 15.6 % — ABNORMAL HIGH (ref 11.5–15.5)
WBC: 5.9 10*3/uL (ref 4.0–10.5)
nRBC: 0.3 % — ABNORMAL HIGH (ref 0.0–0.2)

## 2020-01-08 LAB — BASIC METABOLIC PANEL
Anion gap: 17 — ABNORMAL HIGH (ref 5–15)
BUN: 66 mg/dL — ABNORMAL HIGH (ref 8–23)
CO2: 23 mmol/L (ref 22–32)
Calcium: 8.1 mg/dL — ABNORMAL LOW (ref 8.9–10.3)
Chloride: 98 mmol/L (ref 98–111)
Creatinine, Ser: 13.76 mg/dL — ABNORMAL HIGH (ref 0.61–1.24)
GFR calc Af Amer: 4 mL/min — ABNORMAL LOW (ref >60)
GFR calc non Af Amer: 3 mL/min — ABNORMAL LOW (ref >60)
Glucose, Bld: 196 mg/dL — ABNORMAL HIGH (ref 70–99)
Potassium: 4.5 mmol/L (ref 3.5–5.1)
Sodium: 138 mmol/L (ref 135–145)

## 2020-01-08 NOTE — Progress Notes (Signed)
[place] OFFICE PROGRESS NOTE  Patient Care Team: [name], MD as PCP - General (Family Medicine) Lucilla Lame, MD as Consulting Physician (Gastroenterology) Murlean Iba, MD (Nephrology) Alisa Graff, FNP (Cardiology) Yolonda Kida, MD as Consulting Physician (Cardiology) Chesley Mires, MD as Consulting Physician (Pulmonary Disease)  Cancer Staging No matching staging information was found for the patient.   Oncology History   No history exists.   # Acute on chronic anemia [nadir 5.3; SEP 2021/hopsitals/p 5 units PRBC]' EGD/[place]o-NEG; CT A/P- NEG for bleed  #Cirrhosis [2017 liver biopsy-steatosis; Dr.Wohl] ? NASH  #Thrombocytopenia> 100k-secondary to cirrhosis  # ESRD on PD [Dr.Singh]; diabetes   INTERVAL HISTORY:  Patrick Hurst 64 y.o.  male pleasant patient above history of end-stage renal disease on peritoneal dialysis is here for follow-up of his anemia.  Patient was recently admitted to hospital for mental status changes; low blood glucose level.  Patient was also noted to have severe anemia hemoglobin of 5.3' needing 5 units of PRBC transfusion.  EGD colonoscopy negative for any acute process.  CT scan abdomen pelvis negative for any abdominal bleed.  Since discharge from hospital patient denies any worsening shortness of breath or cough.  Mild to moderate fatigue not any worse.   Review of Systems  Constitutional: Positive for malaise/fatigue. Negative for chills, diaphoresis, fever and weight loss.  HENT: Negative for nosebleeds and sore throat.   Eyes: Negative for double vision.  Respiratory: Positive for shortness of breath. Negative for cough, hemoptysis and wheezing.   Cardiovascular: Negative for chest pain, palpitations, orthopnea and leg swelling.  Gastrointestinal: Negative for abdominal pain, blood in stool, constipation, diarrhea, heartburn, melena, nausea and vomiting.  Genitourinary: Negative for dysuria, frequency and  urgency.  Musculoskeletal: Positive for back pain and joint pain.  Skin: Negative.  Negative for itching and rash.  Neurological: Negative for dizziness, tingling, focal weakness, weakness and headaches.  Endo/Heme/Allergies: Does not bruise/bleed easily.  Psychiatric/Behavioral: Negative for depression. The patient is not nervous/anxious and does not have insomnia.       PAST MEDICAL HISTORY :  Past Medical History:  Diagnosis Date  . Acute on chronic diastolic CHF (congestive heart failure) ([place]) 06/21/2018  . Acute respiratory failure with hypoxia ([place]) 06/21/2018  . Bell palsy 02/12/2015  . Carpal tunnel syndrome 02/12/2015  . Dependence on nocturnal oxygen therapy    2 LITERS WITH BIPAP  . Depression   . Gastric ulcer   . GERD (gastroesophageal reflux disease)   . Gout   . History of chicken pox   . Multifocal pneumonia 03/04/2019  . Sleep apnea treated with nocturnal BiPAP     PAST SURGICAL HISTORY :   Past Surgical History:  Procedure Laterality Date  . AV FISTULA PLACEMENT Left 11/08/2019   Procedure: ARTERIOVENOUS (AV) FISTULA CREATION (RADIALCEPHALIC);  Surgeon: Algernon Huxley, MD;  Location: [facility] ORS;  Service: Vascular;  Laterality: Left;  . CATARACT EXTRACTION Bilateral 2014 and 2015  . CHOLECYSTECTOMY  04/28/2011   Laproscopic; Dr. Pat Patrick  . COLONOSCOPY WITH PROPOFOL N/A 12/30/2019   Procedure: COLONOSCOPY WITH PROPOFOL;  Surgeon: Lin Landsman, MD;  Location: [facility] ENDOSCOPY;  Service: Gastroenterology;  Laterality: N/A;  . DIALYSIS/PERMA CATHETER INSERTION N/A 05/24/2019   Procedure: DIALYSIS/PERMA CATHETER INSERTION;  Surgeon: Algernon Huxley, MD;  Location: [place] CV LAB;  Service: Cardiovascular;  Laterality: N/A;  . DIALYSIS/PERMA CATHETER INSERTION N/A 10/09/2019   Procedure: DIALYSIS/PERMA CATHETER INSERTION;  Surgeon: Katha Cabal, MD;  Location:  [place] CV LAB;  Service: Cardiovascular;  Laterality: N/A;  . ESOPHAGOGASTRODUODENOSCOPY (EGD) WITH  PROPOFOL N/A 12/29/2019   Procedure: ESOPHAGOGASTRODUODENOSCOPY (EGD) WITH PROPOFOL;  Surgeon: Lin Landsman, MD;  Location: [facility] ENDOSCOPY;  Service: Gastroenterology;  Laterality: N/A;  . EYE SURGERY    . GALLBLADDER SURGERY    . LASIK Bilateral 2019   medical  . SHOULDER SURGERY Right 2012   Dr. Leanor Kail    FAMILY HISTORY :   Family History  Problem Relation Age of Onset  . COPD Mother   . Cancer Mother   . Heart disease Father     SOCIAL HISTORY:   Social History   Tobacco Use  . Smoking status: Former Smoker    Packs/day: 1.00    Years: 15.00    Pack years: 15.00    Types: Cigarettes    Quit date: 05/03/1978    Years since quitting: 41.7  . Smokeless tobacco: Never Used  . Tobacco comment: started smoking at age 27  Vaping Use  . Vaping Use: Never used  Substance Use Topics  . Alcohol use: No    Alcohol/week: 0.0 standard drinks  . Drug use: No    ALLERGIES:  is allergic to mucinex [guaifenesin er] and levaquin [levofloxacin].  MEDICATIONS:  Current Outpatient Medications  Medication Sig Dispense Refill  . amiodarone (PACERONE) 200 MG tablet Take 200 mg by mouth daily.    Marland Kitchen amitriptyline (ELAVIL) 25 MG tablet TAKE 1 TABLET AT BEDTIME (Patient taking differently: Take 25 mg by mouth at bedtime. ) 90 tablet 3  . Ascorbic Acid (VITAMIN C) 1000 MG tablet Take 1,000 mg by mouth 2 (two) times daily.     Marland Kitchen atorvastatin (LIPITOR) 40 MG tablet TAKE 1 TABLET DAILY (Patient taking differently: Take 40 mg by mouth daily. ) 90 tablet 0  . BD INSULIN SYRINGE U/F 31G X 5/16" 1 ML MISC     . Budeson-Glycopyrrol-Formoterol (BREZTRI AEROSPHERE) 160-9-4.8 MCG/ACT AERO Inhale 2 puffs into the lungs in the morning and at bedtime.     . calcitRIOL (ROCALTROL) 0.5 MCG capsule Take 0.5 mcg by mouth daily.    . cetirizine (ZYRTEC) 10 MG tablet Take 10 mg by mouth daily as needed for allergies.     Marland Kitchen ELIQUIS 2.5 MG TABS tablet Take 2.5 mg by mouth 2 (two) times daily.     Marland Kitchen  gentamicin cream (GARAMYCIN) 0.1 % Apply 1 application topically daily.    . insulin regular human CONCENTRATED (HUMULIN R) 500 UNIT/ML injection Inject up to 25 units twice a day, or as directed by physician (Patient taking differently: Inject 50 Units into the skin 2 (two) times daily with a meal. 60 UNITS IN THE AM & 50 UNITS AT NIGHT) 60 mL 3  . Ipratropium-Albuterol (COMBIVENT RESPIMAT) 20-100 MCG/ACT AERS respimat Inhale 2 puffs into the lungs every 4 (four) hours as needed for wheezing.  (Patient not taking: Reported on 01/08/2020)    . lactulose (CHRONULAC) 10 GM/15ML solution Take 30 mLs by mouth daily.    Marland Kitchen levothyroxine (SYNTHROID) 100 MCG tablet TAKE 1 TABLET DAILY (Patient taking differently: Take 100 mcg by mouth daily before breakfast. ) 90 tablet 2  . metoprolol tartrate (LOPRESSOR) 50 MG tablet Take 0.5 tablets (25 mg total) by mouth 2 (two) times daily.    . pantoprazole (PROTONIX) 40 MG tablet TAKE 1 TABLET TWICE A DAY (Patient taking differently: Take 40 mg by mouth daily. ) 180 tablet 3  . Spacer/Aero-Holding Chambers (AEROCHAMBER  PLUS WITH MASK) inhaler See admin instructions.    Marland Kitchen Spacer/Aero-Holding Chambers (EASIVENT) inhaler See admin instructions.    . torsemide (DEMADEX) 10 MG tablet Take 40 mg by mouth 2 (two) times daily.     Marland Kitchen ULORIC 80 MG TABS Take 80 mg by mouth daily.      No current facility-administered medications for this visit.    PHYSICAL EXAMINATION: ECOG PERFORMANCE STATUS: 1 - Symptomatic but completely ambulatory  BP (!) 152/58 (BP Location: Right Arm, Patient Position: Sitting)   Pulse 67   Temp (!) 97.1 F (36.2 C) (Tympanic)   Resp (!) 22   Ht 5\' 11"  (1.803 m)   Wt (!) 303 lb (137.4 kg)   BMI 42.26 kg/m   Filed Weights   01/08/20 0845  Weight: (!) 303 lb (137.4 kg)    Physical Exam Constitutional:      Comments: Is walking with a cane.  Patient is accompanied by his wife.  HENT:     Head: Normocephalic and atraumatic.      Mouth/Throat:     Pharynx: No oropharyngeal exudate.  Eyes:     Pupils: Pupils are equal, round, and reactive to light.  Cardiovascular:     Rate and Rhythm: Normal rate and regular rhythm.  Pulmonary:     Effort: No respiratory distress.     Breath sounds: No wheezing.     Comments: Decreased air entry bilaterally. Abdominal:     General: Bowel sounds are normal. There is no distension.     Palpations: Abdomen is soft. There is no mass.     Tenderness: There is no abdominal tenderness. There is no guarding or rebound.  Musculoskeletal:        General: No tenderness. Normal range of motion.     Cervical back: Normal range of motion and neck supple.  Skin:    General: Skin is warm.  Neurological:     Mental Status: He is alert and oriented to person, place, and time.  Psychiatric:        Mood and Affect: Affect normal.        LABORATORY DATA:  I have reviewed the data as listed    Component Value Date/Time   NA 138 01/08/2020 0822   NA 140 11/29/2019 1559   NA 137 05/04/2013 1240   K 4.5 01/08/2020 0822   K 4.4 05/04/2013 1240   CL 98 01/08/2020 0822   CL 100 05/04/2013 1240   CO2 23 01/08/2020 0822   CO2 32 05/04/2013 1240   GLUCOSE 196 (H) 01/08/2020 0822   GLUCOSE 123 (H) 05/04/2013 1240   BUN 66 (H) 01/08/2020 0822   BUN 107 (HH) 11/29/2019 1559   BUN 25 (H) 05/04/2013 1240   CREATININE 13.76 (H) 01/08/2020 0822   CREATININE 0.95 05/04/2013 1240   CALCIUM 8.1 (L) 01/08/2020 0822   CALCIUM 9.1 05/04/2013 1240   PROT 6.4 (L) 01/01/2020 0427   PROT 7.0 11/29/2019 1559   PROT 7.8 05/04/2013 1240   ALBUMIN 2.7 (L) 01/01/2020 0427   ALBUMIN 3.8 11/29/2019 1559   ALBUMIN 3.3 (L) 05/04/2013 1240   AST 23 01/01/2020 0427   AST 31 05/04/2013 1240   ALT 22 01/01/2020 0427   ALT 33 05/04/2013 1240   ALKPHOS 92 01/01/2020 0427   ALKPHOS 91 05/04/2013 1240   BILITOT 0.8 01/01/2020 0427   BILITOT 0.3 11/29/2019 1559   BILITOT 0.8 05/04/2013 1240   GFRNONAA 3 (L)  01/08/2020 0822   GFRNONAA >60  05/04/2013 1240   GFRAA 4 (L) 01/08/2020 0822   GFRAA >60 05/04/2013 1240    No results found for: SPEP, UPEP  Lab Results  Component Value Date   WBC 5.9 01/08/2020   NEUTROABS 4.6 01/08/2020   HGB 8.6 (L) 01/08/2020   HCT 26.1 (L) 01/08/2020   MCV 93.2 01/08/2020   PLT 104 (L) 01/08/2020      Chemistry      Component Value Date/Time   NA 138 01/08/2020 0822   NA 140 11/29/2019 1559   NA 137 05/04/2013 1240   K 4.5 01/08/2020 0822   K 4.4 05/04/2013 1240   CL 98 01/08/2020 0822   CL 100 05/04/2013 1240   CO2 23 01/08/2020 0822   CO2 32 05/04/2013 1240   BUN 66 (H) 01/08/2020 0822   BUN 107 (HH) 11/29/2019 1559   BUN 25 (H) 05/04/2013 1240   CREATININE 13.76 (H) 01/08/2020 0822   CREATININE 0.95 05/04/2013 1240   GLU 194 06/04/2014 0000      Component Value Date/Time   CALCIUM 8.1 (L) 01/08/2020 0822   CALCIUM 9.1 05/04/2013 1240   ALKPHOS 92 01/01/2020 0427   ALKPHOS 91 05/04/2013 1240   AST 23 01/01/2020 0427   AST 31 05/04/2013 1240   ALT 22 01/01/2020 0427   ALT 33 05/04/2013 1240   BILITOT 0.8 01/01/2020 0427   BILITOT 0.3 11/29/2019 1559   BILITOT 0.8 05/04/2013 1240       RADIOGRAPHIC STUDIES: I have personally reviewed the radiological images as listed and agreed with the findings in the report. No results found.   ASSESSMENT & PLAN:  Acute on chronic anemia #Acute on chronic anemia/secondary to ESRD on peritoneal dialysis-5.3 nadir hemoglobin [SEP 2021]; s/p 5 units of PRBC transfusion.  No evidence of hemolysis; or blood loss noted-CT abdomen pelvis negative for bleeding.  Worsening anemia likely secondary to renal insufficiency/continue EPO 40 units weekly; with IV iron.  #End-stage renal disease on peritoneal dialysis-however if patient continues to have episodes of severe anemia/without any other obvious cause; hemodialysis could be considered.  Defer to nephrology for further recommendations.    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  Also discussed regarding [place] surveillance.  Will order ultrasound at next visit.  #Discussed with patient and his wife Debbie-the above plan of care.  In agreement.  # DISPOSITION: # No blood today # follow up in 6 months; labs-MD; cbc/cmp/AFP- D.B  Cc; Dr.Singh.    No orders of the defined types were placed in this encounter.  All questions were answered. The patient knows to call the clinic with any problems, questions or concerns.      Cammie Sickle, MD 01/08/2020 5:32 PM

## 2020-01-08 NOTE — Assessment & Plan Note (Addendum)
#  Acute on chronic anemia/secondary to ESRD on peritoneal dialysis-5.3 nadir hemoglobin [SEP 2021]; s/p 5 units of PRBC transfusion.  No evidence of hemolysis; or blood loss noted-CT abdomen pelvis negative for bleeding.  Worsening anemia likely secondary to renal insufficiency/continue EPO 40 units weekly; with IV iron.  #End-stage renal disease on peritoneal dialysis-however if patient continues to have episodes of severe anemia/without any other obvious cause; hemodialysis could be considered.  Defer to nephrology for further recommendations.    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  Also discussed regarding [place] surveillance.  Will order ultrasound at next visit.  #Discussed with patient and his wife Debbie-the above plan of care.  In agreement.  # DISPOSITION: # No blood today # follow up in 6 months; labs-MD; cbc/cmp/AFP- D.B  Cc; Dr.Singh.

## 2020-01-08 NOTE — Patient Instructions (Signed)
.   Please review the attached list of medications and notify my office if there are any errors.   . Please bring all of your medications to every appointment so we can make sure that our medication list is the same as yours.   

## 2020-01-09 LAB — SAMPLE TO BLOOD BANK

## 2020-01-09 NOTE — Progress Notes (Deleted)
Patient ID: Patrick Hurst, male    DOB: 1955/06/14, 64 y.o.   MRN: [id number]  HPI  Patrick Hurst is a 64 y/o male with a history of DM, hyperlipidemia, HTN, CKD, thyroid disease, GERD, gout, anemia, depression, obstructive sleep apnea, bell's palsy, remote tobacco use and chronic heart failure.   Echo report from 11/16/19 reviewed and showed an EF of 60-65% along with mild LVH / LAE. Echo report from 03/06/2019 reviewed and showed an EF of 55-60% along with minimal Patrick and trivial TR. Echo report from 06/23/2018 reviewed and showed an EF of 50-55%.  Admitted 12/27/19 due to acute encephalopathy and acute on chronic HF. Initially hypoglycemia with glucose of 52. Needed bipap. Urine was positive for tricyclics and narcan given with slight improvement of mental status. Received dialysis 3 consecutive days. Nephrology and oncology consult obtained. Hemoglobin low so EGD and colonoscopy done which were negative. To have outpatient capsule endoscopy done. Received 5 units of blood. Weaned off bipap to room air. Abdominal CT negative for hematoma. Received antibiotics while admitted. PT evaluation done. Discharged after 5 days. Admitted 11/15/19 due to shortness of breath due to pneumonia. GI and pulmonology consults obtained. Antibiotics given. MRI abdomen significant for benign focal fatty deposition. Dialysis completed while inpatient. Right leg [place] negative for abscess and xray negative for fracture. Discharged after 8 days.   He presents today for a follow-up visit with a chief complaint of   Continues with peritoneal dialysis.       Past Medical History:  Diagnosis Date  . Acute on chronic diastolic CHF (congestive heart failure) ([place]) 06/21/2018  . Acute respiratory failure with hypoxia ([place]) 06/21/2018  . Bell palsy 02/12/2015  . Carpal tunnel syndrome 02/12/2015  . Dependence on nocturnal oxygen therapy    2 LITERS WITH BIPAP  . Depression   . Gastric ulcer   . GERD (gastroesophageal reflux  disease)   . Gout   . History of chicken pox   . Multifocal pneumonia 03/04/2019  . Sleep apnea treated with nocturnal BiPAP    Past Surgical History:  Procedure Laterality Date  . AV FISTULA PLACEMENT Left 11/08/2019   Procedure: ARTERIOVENOUS (AV) FISTULA CREATION (RADIALCEPHALIC);  Surgeon: Algernon Huxley, MD;  Location: [facility] ORS;  Service: Vascular;  Laterality: Left;  . CATARACT EXTRACTION Bilateral 2014 and 2015  . CHOLECYSTECTOMY  04/28/2011   Laproscopic; Dr. Pat Patrick  . COLONOSCOPY WITH PROPOFOL N/A 12/30/2019   Procedure: COLONOSCOPY WITH PROPOFOL;  Surgeon: Lin Landsman, MD;  Location: [facility] ENDOSCOPY;  Service: Gastroenterology;  Laterality: N/A;  . DIALYSIS/PERMA CATHETER INSERTION N/A 05/24/2019   Procedure: DIALYSIS/PERMA CATHETER INSERTION;  Surgeon: Algernon Huxley, MD;  Location: [place] CV LAB;  Service: Cardiovascular;  Laterality: N/A;  . DIALYSIS/PERMA CATHETER INSERTION N/A 10/09/2019   Procedure: DIALYSIS/PERMA CATHETER INSERTION;  Surgeon: Katha Cabal, MD;  Location: [place] CV LAB;  Service: Cardiovascular;  Laterality: N/A;  . ESOPHAGOGASTRODUODENOSCOPY (EGD) WITH PROPOFOL N/A 12/29/2019   Procedure: ESOPHAGOGASTRODUODENOSCOPY (EGD) WITH PROPOFOL;  Surgeon: Lin Landsman, MD;  Location[name] ENDOSCOPY;  Service: Gastroenterology;  Laterality: N/A;  . EYE SURGERY    . GALLBLADDER SURGERY    . LASIK Bilateral 2019   medical  . SHOULDER SURGERY Right 2012   Dr. Leanor Kail   Family History  Problem Relation Age of Onset  . COPD Mother   . Cancer Mother   . Heart disease Father    Social History   Tobacco Use  .  Smoking status: Former Smoker    Packs/day: 1.00    Years: 15.00    Pack years: 15.00    Types: Cigarettes    Quit date: 05/03/1978    Years since quitting: 41.7  . Smokeless tobacco: Never Used  . Tobacco comment: started smoking at age 61  Substance Use Topics  . Alcohol use: No    Alcohol/week: 0.0 standard drinks    Allergies  Allergen Reactions  . Mucinex [Guaifenesin Er] Swelling    Throat swelling, increases heart rate  . Levaquin [Levofloxacin] Palpitations      Review of Systems  Constitutional: Positive for fatigue. Negative for appetite change.  HENT: Positive for rhinorrhea. Negative for congestion and sore throat.   Eyes: Negative.   Respiratory: Positive for shortness of breath (with little exertion). Negative for cough and wheezing.   Cardiovascular: Positive for leg swelling (worse since switching to peritoneal dialysis). Negative for chest pain and palpitations.  Gastrointestinal: Negative for abdominal distention and abdominal pain.  Endocrine: Negative.   Genitourinary: Negative.   Musculoskeletal: Negative for back pain and neck pain.  Skin: Positive for color change (bilateral lower legs).  Allergic/Immunologic: Negative.   Neurological: Positive for light-headedness. Negative for dizziness.  Hematological: Negative for adenopathy. Does not bruise/bleed easily.  Psychiatric/Behavioral: Negative for dysphoric mood and sleep disturbance (wearing cpap at night; oxygen @2L ; sleeping on 1 pillow and wedge). The patient is not nervous/anxious.      Physical Exam Vitals and nursing note reviewed.  Constitutional:      Appearance: He is well-developed.  HENT:     Head: Normocephalic and atraumatic.  Neck:     Vascular: No JVD.  Cardiovascular:     Rate and Rhythm: Normal rate and regular rhythm.  Pulmonary:     Effort: Pulmonary effort is normal. No respiratory distress.     Breath sounds: No wheezing or rales.  Abdominal:     Palpations: Abdomen is soft.     Tenderness: There is no abdominal tenderness.  Musculoskeletal:     Cervical back: Normal range of motion and neck supple.     Right lower leg: Tenderness (front and medial lower leg) present. Edema (1+ pitting) present.     Left lower leg: Tenderness (front and medial lower leg) present. Edema (1+ pitting)  present.  Skin:    General: Skin is warm and dry.     Findings: Erythema (lower legs) present.  Neurological:     General: No focal deficit present.     Mental Status: He is alert and oriented to person, place, and time.  Psychiatric:        Mood and Affect: Mood normal.        Behavior: Behavior normal.    Assessment & Plan:  1: Chronic heart failure with preserved ejection fraction with structural changes- - NYHA class III - euvolemic today  - weighing daily and he was reminded to call for an overnight weight gain of >2 pounds or a weekly weight gain of >5 pounds - weight 297.8 pounds from last visit here 1 month ago - not adding salt and has been reading food labels as well as rinsing canned foods  - saw cardiology (White) 09/18/19 - BNP 12/27/19 was 319.9 - reports receiving both COVID vaccines  2: HTN- - BP  - saw PCP Caryn Section) 01/08/20 - BMP from 01/08/20 reviewed and showed sodium 138, potassium 4.5, creatinine 13.76 and GFR 3  3: DM-  - A1c 11/12/19 was 5.8% - glucose  at home this morning was  - saw nephrology [name]) 05/14/19 - now doing peritoneal dialysis with adjustments still being made - drinking ~ 50 ounces fluid/ day and is aware that he should only be drinking ~ 32 ounces/ day per nephrology recommendation - saw vascular Owens Shark) 12/21/19  4: COPD- - saw pulmonologist Lanney Gins) 12/24/19 - wearing cpap - wears oxygen at 2L QHS and PRN during the day or upon exertion   5: Anemia- - saw oncology Rogue Bussing) 01/08/20   Medication list was reviewed.

## 2020-01-10 ENCOUNTER — Ambulatory Visit: Payer: Commercial Managed Care - PPO | Admitting: Family

## 2020-01-13 ENCOUNTER — Other Ambulatory Visit: Payer: Self-pay | Admitting: Family Medicine

## 2020-01-13 NOTE — Telephone Encounter (Signed)
Requested medications are due for refill today?  Yes  Requested medications are on active medication list? Yes  Last Refill:  10/15/2019  # 90 with no refills   Future visit scheduled?  Yes  Notes to Clinic:  Medication failed Rx refill protocol due to no labs within the past 360 days.  Last labs were performed on 02/20/2018.

## 2020-01-14 ENCOUNTER — Other Ambulatory Visit: Payer: Commercial Managed Care - PPO

## 2020-01-14 ENCOUNTER — Ambulatory Visit: Payer: Commercial Managed Care - PPO | Admitting: Internal Medicine

## 2020-01-14 ENCOUNTER — Ambulatory Visit: Payer: Commercial Managed Care - PPO

## 2020-01-21 ENCOUNTER — Inpatient Hospital Stay: Payer: Commercial Managed Care - PPO | Admitting: Family Medicine

## 2020-01-25 ENCOUNTER — Ambulatory Visit: Payer: Commercial Managed Care - PPO | Attending: Family | Admitting: Family

## 2020-01-25 ENCOUNTER — Encounter: Payer: Self-pay | Admitting: Family

## 2020-01-25 ENCOUNTER — Other Ambulatory Visit: Payer: Self-pay

## 2020-01-25 VITALS — BP 144/70 | HR 84 | Resp 18 | Ht 71.0 in | Wt 299.5 lb

## 2020-01-25 DIAGNOSIS — Z9981 Dependence on supplemental oxygen: Secondary | ICD-10-CM | POA: Insufficient documentation

## 2020-01-25 DIAGNOSIS — E1122 Type 2 diabetes mellitus with diabetic chronic kidney disease: Secondary | ICD-10-CM

## 2020-01-25 DIAGNOSIS — Z87891 Personal history of nicotine dependence: Secondary | ICD-10-CM | POA: Insufficient documentation

## 2020-01-25 DIAGNOSIS — Z79899 Other long term (current) drug therapy: Secondary | ICD-10-CM | POA: Insufficient documentation

## 2020-01-25 DIAGNOSIS — Z9989 Dependence on other enabling machines and devices: Secondary | ICD-10-CM | POA: Insufficient documentation

## 2020-01-25 DIAGNOSIS — F329 Major depressive disorder, single episode, unspecified: Secondary | ICD-10-CM | POA: Insufficient documentation

## 2020-01-25 DIAGNOSIS — Z7901 Long term (current) use of anticoagulants: Secondary | ICD-10-CM | POA: Diagnosis not present

## 2020-01-25 DIAGNOSIS — E079 Disorder of thyroid, unspecified: Secondary | ICD-10-CM | POA: Diagnosis not present

## 2020-01-25 DIAGNOSIS — I130 Hypertensive heart and chronic kidney disease with heart failure and stage 1 through stage 4 chronic kidney disease, or unspecified chronic kidney disease: Secondary | ICD-10-CM | POA: Diagnosis not present

## 2020-01-25 DIAGNOSIS — Z881 Allergy status to other antibiotic agents status: Secondary | ICD-10-CM | POA: Insufficient documentation

## 2020-01-25 DIAGNOSIS — K219 Gastro-esophageal reflux disease without esophagitis: Secondary | ICD-10-CM | POA: Insufficient documentation

## 2020-01-25 DIAGNOSIS — I10 Essential (primary) hypertension: Secondary | ICD-10-CM

## 2020-01-25 DIAGNOSIS — Z9049 Acquired absence of other specified parts of digestive tract: Secondary | ICD-10-CM | POA: Diagnosis not present

## 2020-01-25 DIAGNOSIS — E785 Hyperlipidemia, unspecified: Secondary | ICD-10-CM | POA: Insufficient documentation

## 2020-01-25 DIAGNOSIS — N189 Chronic kidney disease, unspecified: Secondary | ICD-10-CM | POA: Diagnosis not present

## 2020-01-25 DIAGNOSIS — I5032 Chronic diastolic (congestive) heart failure: Secondary | ICD-10-CM | POA: Diagnosis not present

## 2020-01-25 DIAGNOSIS — Z794 Long term (current) use of insulin: Secondary | ICD-10-CM | POA: Insufficient documentation

## 2020-01-25 DIAGNOSIS — J449 Chronic obstructive pulmonary disease, unspecified: Secondary | ICD-10-CM | POA: Diagnosis not present

## 2020-01-25 DIAGNOSIS — D649 Anemia, unspecified: Secondary | ICD-10-CM | POA: Diagnosis not present

## 2020-01-25 DIAGNOSIS — Z79890 Hormone replacement therapy (postmenopausal): Secondary | ICD-10-CM | POA: Diagnosis not present

## 2020-01-25 DIAGNOSIS — G4733 Obstructive sleep apnea (adult) (pediatric): Secondary | ICD-10-CM | POA: Diagnosis not present

## 2020-01-25 DIAGNOSIS — Z992 Dependence on renal dialysis: Secondary | ICD-10-CM | POA: Diagnosis not present

## 2020-01-25 DIAGNOSIS — D509 Iron deficiency anemia, unspecified: Secondary | ICD-10-CM

## 2020-01-25 NOTE — Progress Notes (Signed)
Patient ID: Patrick Hurst, male    DOB: December 18, 1955, 64 y.o.   MRN: [id number]  HPI  Patrick Hurst is a 64 y/o male with a history of DM, hyperlipidemia, HTN, CKD, thyroid disease, GERD, gout, anemia, depression, obstructive sleep apnea, bell's palsy, remote tobacco use and chronic heart failure.   Echo report from 11/16/19 reviewed and showed an EF of 60-65% along with mild LVH / LAE. Echo report from 03/06/2019 reviewed and showed an EF of 55-60% along with minimal Patrick and trivial TR. Echo report from 06/23/2018 reviewed and showed an EF of 50-55%.  Admitted 12/27/19 due to acute encephalopathy and acute on chronic HF. Initially hypoglycemia with glucose of 52. Needed bipap. Urine was positive for tricyclics and narcan given with slight improvement of mental status. Received dialysis 3 consecutive days. Nephrology and oncology consult obtained. Hemoglobin low so EGD and colonoscopy done which were negative. To have outpatient capsule endoscopy done. Received 5 units of blood. Weaned off bipap to room air. Abdominal CT negative for hematoma. Received antibiotics while admitted. PT evaluation done. Discharged after 5 days. Admitted 11/15/19 due to shortness of breath due to pneumonia. GI and pulmonology consults obtained. Antibiotics given. MRI abdomen significant for benign focal fatty deposition. Dialysis completed while inpatient. Right leg [place] negative for abscess and xray negative for fracture. Discharged after 8 days.   He presents today for a follow-up visit with a chief complaint of moderate shortness of breath with minimal exertion. He describes this as chronic in nature having been present for several years. He has associated pedal edema and light-headedness along with this. He denies any difficulty sleeping, abdominal distention, palpitations, chest pain, wheezing, cough, fatigue or weight gain.   Continues with peritoneal dialysis with intermittent hemodialysis if needed. Did recently go on vacation  and was unable to take his oxygen with him because of the size of the tank. Did take his CPAP though. Says that his provider is trying to get him a smaller concentrator.  Did eat out more while on vacation and knows that he ate more salt due to that.    Past Medical History:  Diagnosis Date  . Acute on chronic diastolic CHF (congestive heart failure) ([place]) 06/21/2018  . Acute respiratory failure with hypoxia ([place]) 06/21/2018  . Bell palsy 02/12/2015  . Carpal tunnel syndrome 02/12/2015  . Dependence on nocturnal oxygen therapy    2 LITERS WITH BIPAP  . Depression   . Gastric ulcer   . GERD (gastroesophageal reflux disease)   . Gout   . History of chicken pox   . Multifocal pneumonia 03/04/2019  . Sleep apnea treated with nocturnal BiPAP    Past Surgical History:  Procedure Laterality Date  . AV FISTULA PLACEMENT Left 11/08/2019   Procedure: ARTERIOVENOUS (AV) FISTULA CREATION (RADIALCEPHALIC);  Surgeon: Algernon Huxley, MD;  Location: [facility] ORS;  Service: Vascular;  Laterality: Left;  . CATARACT EXTRACTION Bilateral 2014 and 2015  . CHOLECYSTECTOMY  04/28/2011   Laproscopic; Dr. Pat Patrick  . COLONOSCOPY WITH PROPOFOL N/A 12/30/2019   Procedure: COLONOSCOPY WITH PROPOFOL;  Surgeon: Lin Landsman, MD;  Location: [facility] ENDOSCOPY;  Service: Gastroenterology;  Laterality: N/A;  . DIALYSIS/PERMA CATHETER INSERTION N/A 05/24/2019   Procedure: DIALYSIS/PERMA CATHETER INSERTION;  Surgeon: Algernon Huxley, MD;  Location: [place] CV LAB;  Service: Cardiovascular;  Laterality: N/A;  . DIALYSIS/PERMA CATHETER INSERTION N/A 10/09/2019   Procedure: DIALYSIS/PERMA CATHETER INSERTION;  Surgeon: Katha Cabal, MD;  Location: [place] CV LAB;  Service: Cardiovascular;  Laterality: N/A;  . ESOPHAGOGASTRODUODENOSCOPY (EGD) WITH PROPOFOL N/A 12/29/2019   Procedure: ESOPHAGOGASTRODUODENOSCOPY (EGD) WITH PROPOFOL;  Surgeon: Lin Landsman, MD;  Location: [facility] ENDOSCOPY;  Service: Gastroenterology;   Laterality: N/A;  . EYE SURGERY    . GALLBLADDER SURGERY    . LASIK Bilateral 2019   medical  . SHOULDER SURGERY Right 2012   Dr. Leanor Kail   Family History  Problem Relation Age of Onset  . COPD Mother   . Cancer Mother   . Heart disease Father    Social History   Tobacco Use  . Smoking status: Former Smoker    Packs/day: 1.00    Years: 15.00    Pack years: 15.00    Types: Cigarettes    Quit date: 05/03/1978    Years since quitting: 41.7  . Smokeless tobacco: Never Used  . Tobacco comment: started smoking at age 71  Substance Use Topics  . Alcohol use: No    Alcohol/week: 0.0 standard drinks   Allergies  Allergen Reactions  . Mucinex [Guaifenesin Er] Swelling    Throat swelling, increases heart rate  . Levaquin [Levofloxacin] Palpitations   Prior to Admission medications   Medication Sig Start Date End Date Taking? Authorizing Provider  amiodarone (PACERONE) 200 MG tablet Take 200 mg by mouth daily. 08/16/19  Yes [provider]  amitriptyline (ELAVIL) 25 MG tablet TAKE 1 TABLET AT BEDTIME Patient taking differently: Take 25 mg by mouth at bedtime.  07/26/19  Yes [name], MD  Ascorbic Acid (VITAMIN C) 1000 MG tablet Take 1,000 mg by mouth 2 (two) times daily.    Yes [provider]  atorvastatin (LIPITOR) 40 MG tablet Take 1 tablet (40 mg total) by mouth daily. 01/13/20  Yes [name], MD  BD INSULIN SYRINGE U/F 31G X 5/16" 1 ML MISC  11/18/19  Yes [provider]  Budeson-Glycopyrrol-Formoterol (BREZTRI AEROSPHERE) 160-9-4.8 MCG/ACT AERO Inhale 2 puffs into the lungs in the morning and at bedtime.    Yes [provider]  calcitRIOL (ROCALTROL) 0.5 MCG capsule Take 0.5 mcg by mouth daily. 12/29/18  Yes [provider]  cetirizine (ZYRTEC) 10 MG tablet Take 10 mg by mouth daily as needed for allergies.    Yes [provider]  ELIQUIS 2.5 MG TABS tablet Take 2.5 mg by mouth 2 (two) times daily.   03/23/19  Yes [provider]  gentamicin cream (GARAMYCIN) 0.1 % Apply 1 application topically daily. 09/22/19  Yes [provider]  insulin regular human CONCENTRATED (HUMULIN R) 500 UNIT/ML injection Inject up to 25 units twice a day, or as directed by physician Patient taking differently: Inject 50 Units into the skin 2 (two) times daily with a meal. 60 UNITS IN THE AM & 50 UNITS AT NIGHT 01/05/19  Yes [name], MD  Ipratropium-Albuterol (COMBIVENT RESPIMAT) 20-100 MCG/ACT AERS respimat Inhale 2 puffs into the lungs every 4 (four) hours as needed for wheezing.    Yes [provider]  lactulose (CHRONULAC) 10 GM/15ML solution Take 30 mLs by mouth daily. 09/10/19  Yes [provider]  levothyroxine (SYNTHROID) 100 MCG tablet TAKE 1 TABLET DAILY Patient taking differently: Take 100 mcg by mouth daily before breakfast.  08/01/19  Yes Fisher, Kirstie Peri, MD  metoprolol tartrate (LOPRESSOR) 50 MG tablet Take 0.5 tablets (25 mg total) by mouth 2 (two) times daily. 01/01/20  Yes Shawna Clamp, MD  pantoprazole (PROTONIX) 40 MG tablet TAKE 1 TABLET TWICE  A DAY Patient taking differently: Take 40 mg by mouth daily.  02/10/19  Yes [name], MD  Spacer/Aero-Holding Chambers (AEROCHAMBER PLUS WITH MASK) inhaler See admin instructions. 12/05/19  Yes [provider]  Spacer/Aero-Holding Chambers (EASIVENT) inhaler See admin instructions. 12/05/19 12/04/20 Yes [provider]  torsemide (DEMADEX) 10 MG tablet Take 40 mg by mouth 2 (two) times daily.  08/13/19  Yes [provider]  ULORIC 80 MG TABS Take 80 mg by mouth daily.  05/18/17  Yes [provider]     Review of Systems  Constitutional: Negative for appetite change.  HENT: Negative for congestion.   Eyes: Negative.   Respiratory: Positive for shortness of breath. Negative for cough and wheezing.   Cardiovascular: Positive for leg swelling. Negative for chest pain and  palpitations.  Gastrointestinal: Negative for abdominal distention and abdominal pain.  Endocrine: Negative.   Genitourinary: Negative.   Musculoskeletal: Negative for back pain.  Skin: Negative.   Allergic/Immunologic: Negative.   Neurological: Positive for light-headedness. Negative for dizziness.  Hematological: Negative for adenopathy. Does not bruise/bleed easily.  Psychiatric/Behavioral: Negative for dysphoric mood and sleep disturbance (wearing cpap at night; oxygen @2L ; sleeping on 1 pillow and wedge). The patient is not nervous/anxious.    Vitals:   01/25/20 0845  BP: (!) 144/70  Pulse: 84  Resp: 18  SpO2: 95%  Weight: 299 lb 8 oz (135.9 kg)  Height: 5\' 11"  (1.803 m)   Wt Readings from Last 3 Encounters:  01/25/20 299 lb 8 oz (135.9 kg)  01/08/20 (!) 305 lb (138.3 kg)  01/08/20 (!) 303 lb (137.4 kg)   Lab Results  Component Value Date   CREATININE 13.76 (H) 01/08/2020   CREATININE 6.12 (H) 01/01/2020   CREATININE 8.25 (H) 12/31/2019    Physical Exam Vitals and nursing note reviewed.  Constitutional:      Appearance: He is well-developed.  HENT:     Head: Normocephalic and atraumatic.  Neck:     Vascular: No JVD.  Cardiovascular:     Rate and Rhythm: Normal rate and regular rhythm.  Pulmonary:     Effort: Pulmonary effort is normal. No respiratory distress.     Breath sounds: No wheezing or rales.  Abdominal:     Palpations: Abdomen is soft.     Tenderness: There is no abdominal tenderness.  Musculoskeletal:     Cervical back: Normal range of motion and neck supple.     Right lower leg: No tenderness. Edema (trace pitting) present.     Left lower leg: No tenderness. Edema (trace pitting) present.  Skin:    General: Skin is warm and dry.  Neurological:     General: No focal deficit present.     Mental Status: He is alert and oriented to person, place, and time.  Psychiatric:        Mood and Affect: Mood normal.        Behavior: Behavior normal.     Assessment & Plan:  1: Chronic heart failure with preserved ejection fraction with structural changes- - NYHA class III - euvolemic today  - weighing daily and he was reminded to call for an overnight weight gain of >2 pounds or a weekly weight gain of >5 pounds - weight up 2 pounds from last visit here 1 month ago - no adding salt and has been reading food labels as well as rinsing canned foods; did eat out a lot while on vacation and is aware that he ate more  salt because of that. Now back on his regular diet - saw cardiology Dema Severin) 09/18/19 - BNP 12/27/19 was 319.9 - reports receiving both COVID vaccines  - reports getting his flu vaccine for this season  2: HTN- - BP looks good today - saw PCP Caryn Section) 01/08/20 - BMP from 01/08/20 reviewed and showed sodium 138, potassium 4.5, creatinine 13.76 and GFR 3  3: DM-  - A1c 11/12/19 was 5.8% - glucose at home this morning was 213 - saw nephrology [name]) 05/14/19 - doing peritoneal dialysis with adjustments still being made; received hemodialysis 2 days ago - drinking ~ 50 ounces fluid/ day and is aware that he should only be drinking ~ 32 ounces/ day per nephrology recommendation - saw vascular Owens Shark) 12/21/19  4: COPD- - saw pulmonologist Lanney Gins) 12/24/19 - wearing cpap - wears oxygen at 2L QHS and PRN during the day or upon exertion   5: Anemia- - saw oncology Rogue Bussing) 01/08/20   Medication list was reviewed.  Return in 3 months or sooner for any questions/problems before then.

## 2020-01-25 NOTE — Patient Instructions (Signed)
Continue weighing daily and call for an overnight weight gain of > 2 pounds or a weekly weight gain of >5 pounds. 

## 2020-01-29 ENCOUNTER — Other Ambulatory Visit: Payer: Self-pay

## 2020-01-30 ENCOUNTER — Other Ambulatory Visit: Payer: Self-pay

## 2020-01-30 ENCOUNTER — Encounter: Payer: Self-pay | Admitting: Gastroenterology

## 2020-01-30 ENCOUNTER — Telehealth: Payer: Self-pay

## 2020-01-30 ENCOUNTER — Ambulatory Visit (INDEPENDENT_AMBULATORY_CARE_PROVIDER_SITE_OTHER): Payer: Commercial Managed Care - PPO | Admitting: Gastroenterology

## 2020-01-30 VITALS — BP 136/72 | HR 86 | Temp 98.6°F | Wt 301.4 lb

## 2020-01-30 DIAGNOSIS — D509 Iron deficiency anemia, unspecified: Secondary | ICD-10-CM

## 2020-01-30 DIAGNOSIS — D649 Anemia, unspecified: Secondary | ICD-10-CM | POA: Diagnosis not present

## 2020-01-30 MED ORDER — NA SULFATE-K SULFATE-MG SULF 17.5-3.13-1.6 GM/177ML PO SOLN: 354.0000 mL | Freq: Once | ORAL | 0 refills | Status: AC

## 2020-01-30 MED ORDER — MAGNESIUM CITRATE PO SOLN: 1.0000 | Freq: Once | ORAL | 0 refills | Status: AC

## 2020-01-30 NOTE — Progress Notes (Signed)
[name], MD [address]  [place]  [place], [place] [address]  Main: [phone]  Fax: [phone]    Gastroenterology Consultation  Referring Provider:     [name], MD Primary Care Physician:  [name], MD Primary Gastroenterologist:  Dr. [name] Reason for Consultation:     Chronic anemia, history of melena        HPI:   Patrick Hurst is a 64 y.o. male referred by Dr. [name], MD  for consultation & management of history of melena, chronic anemia Patient has multiple comorbidities as listed below, significant for ESRD on hemodialysis, diastolic heart failure on peritoneal dialysis, A. fib on Eliquis who was recently admitted to [facility] in last week of August secondary to 3-4 episodes of melena.  Patient underwent upper endoscopy which was unremarkable.  Colonoscopy was unremarkable.  Patient is on Epogen.  His hemoglobin at the time of discharge was 8, repeat hemoglobin was 8.6 as outpatient.  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  He is not on oxygen as outpatient.  He reports that his shortness of breath is gradually improving.  He reports receiving Neupogen during dialysis.  He reports taking iron pill  NSAIDs: None  Antiplts/Anticoagulants/Anti thrombotics: Eliquis for history of A. fib  GI Procedures:  Upper endoscopy 12/21/2019 - Normal examined duodenum. - Portal hypertensive gastropathy. Biopsied. - Normal gastroesophageal junction and esophagus.  DIAGNOSIS:  A. STOMACH, RANDOM; COLD BIOPSY:  - GASTRIC ANTRAL AND OXYNTIC MUCOSA WITH SUPERFICIAL VASCULAR CONGESTION  AND MILD REACTIVE CHANGES.  - NEGATIVE FOR ACTIVE INFLAMMATION AND H PYLORI.  - NEGATIVE FOR INTESTINAL METAPLASIA, DYSPLASIA, AND MALIGNANCY.   Colonoscopy 12/30/2019 - Preparation of the colon was poor. - Stool in the entire examined  colon. - Normal mucosa in the entire examined colon. - No specimens collected.  Past Medical History:  Diagnosis Date  . Acute on chronic diastolic CHF (congestive heart failure) ([place]) 06/21/2018  . Acute respiratory failure with hypoxia ([place]) 06/21/2018  . Bell palsy 02/12/2015  . Carpal tunnel syndrome 02/12/2015  . Dependence on nocturnal oxygen therapy    2 LITERS WITH BIPAP  . Depression   . Gastric ulcer   . GERD (gastroesophageal reflux disease)   . Gout   . History of chicken pox   . Multifocal pneumonia 03/04/2019  . Sleep apnea treated with nocturnal BiPAP     Past Surgical History:  Procedure Laterality Date  . AV FISTULA PLACEMENT Left 11/08/2019   Procedure: ARTERIOVENOUS (AV) FISTULA CREATION (RADIALCEPHALIC);  Surgeon: Algernon Huxley, MD;  Location: [facility] ORS;  Service: Vascular;  Laterality: Left;  . CATARACT EXTRACTION Bilateral 2014 and 2015  . CHOLECYSTECTOMY  04/28/2011   Laproscopic; Dr. Pat Patrick  . COLONOSCOPY WITH PROPOFOL N/A 12/30/2019   Procedure: COLONOSCOPY WITH PROPOFOL;  Surgeon: Lin Landsman, MD;  Location: [facility] ENDOSCOPY;  Service: Gastroenterology;  Laterality: N/A;  . DIALYSIS/PERMA CATHETER INSERTION N/A 05/24/2019   Procedure: DIALYSIS/PERMA CATHETER INSERTION;  Surgeon: Algernon Huxley, MD;  Location: [place] CV LAB;  Service: Cardiovascular;  Laterality: N/A;  . DIALYSIS/PERMA CATHETER INSERTION N/A 10/09/2019   Procedure: DIALYSIS/PERMA CATHETER INSERTION;  Surgeon: Katha Cabal, MD;  Location: [place] CV LAB;  Service: Cardiovascular;  Laterality: N/A;  . ESOPHAGOGASTRODUODENOSCOPY (EGD) WITH PROPOFOL N/A 12/29/2019   Procedure: ESOPHAGOGASTRODUODENOSCOPY (EGD) WITH PROPOFOL;  Surgeon: Lin Landsman, MD;  Location: [facility] ENDOSCOPY;  Service: Gastroenterology;  Laterality: N/A;  . EYE SURGERY    . GALLBLADDER SURGERY    . LASIK Bilateral 2019   medical  . SHOULDER SURGERY Right 2012   Dr. Leanor Kail    Current Outpatient  Medications:  .  amiodarone (PACERONE) 200 MG tablet, Take 200 mg by mouth daily., Disp: , Rfl:  .  amitriptyline (ELAVIL) 25 MG tablet, TAKE 1 TABLET AT BEDTIME (Patient taking differently: Take 25 mg by mouth at bedtime. ), Disp: 90 tablet, Rfl: 3 .  Ascorbic Acid (VITAMIN C) 1000 MG tablet, Take 1,000 mg by mouth 2 (two) times daily. , Disp: , Rfl:  .  atorvastatin (LIPITOR) 40 MG tablet, Take 1 tablet (40 mg total) by mouth daily., Disp: 90 tablet, Rfl: 3 .  BD INSULIN SYRINGE U/F 31G X 5/16" 1 ML MISC, , Disp: , Rfl:  .  Budeson-Glycopyrrol-Formoterol (BREZTRI AEROSPHERE) 160-9-4.8 MCG/ACT AERO, Inhale 2 puffs into the lungs in the morning and at bedtime. , Disp: , Rfl:  .  calcitRIOL (ROCALTROL) 0.5 MCG capsule, Take 0.5 mcg by mouth daily., Disp: , Rfl:  .  cetirizine (ZYRTEC) 10 MG tablet, Take 10 mg by mouth daily as needed for allergies. , Disp: , Rfl:  .  ELIQUIS 2.5 MG TABS tablet, Take 2.5 mg by mouth 2 (two) times daily. , Disp: , Rfl:  .  FLOMAX 0.4 MG CAPS capsule, Take 0.4 mg by mouth daily., Disp: , Rfl:  .  gentamicin cream (GARAMYCIN) 0.1 %, Apply 1 application topically daily., Disp: , Rfl:  .  insulin regular human CONCENTRATED (HUMULIN R) 500 UNIT/ML injection, Inject up to 25 units twice a day, or as directed by physician (Patient taking differently: Inject 50 Units into the skin 2 (two) times daily with a meal. 60 UNITS IN THE AM & 50 UNITS AT NIGHT), Disp: 60 mL, Rfl: 3 .  Ipratropium-Albuterol (COMBIVENT RESPIMAT) 20-100 MCG/ACT AERS respimat, Inhale 2 puffs into the lungs every 4 (four) hours as needed for wheezing. , Disp: , Rfl:  .  lactulose (CHRONULAC) 10 GM/15ML solution, Take 30 mLs by mouth daily., Disp: , Rfl:  .  levothyroxine (SYNTHROID) 100 MCG tablet, TAKE 1 TABLET DAILY (Patient taking differently: Take 100 mcg by mouth daily before breakfast. ), Disp: 90 tablet, Rfl: 2 .  metoprolol tartrate (LOPRESSOR) 50 MG tablet, Take 0.5 tablets (25 mg total) by mouth 2  (two) times daily., Disp: , Rfl:  .  pantoprazole (PROTONIX) 40 MG tablet, TAKE 1 TABLET TWICE A DAY (Patient taking differently: Take 40 mg by mouth daily. ), Disp: 180 tablet, Rfl: 3 .  sevelamer carbonate (RENVELA) 800 MG tablet, Take by mouth., Disp: , Rfl:  .  Spacer/Aero-Holding Chambers (AEROCHAMBER PLUS WITH MASK) inhaler, See admin instructions., Disp: , Rfl:  .  Spacer/Aero-Holding Chambers (EASIVENT) inhaler, See admin instructions., Disp: , Rfl:  .  torsemide (DEMADEX) 10 MG tablet, Take 40 mg by mouth 2 (two) times daily. , Disp: , Rfl:  .  ULORIC 80 MG TABS, Take 80 mg by mouth daily. , Disp: , Rfl:  .  magnesium citrate SOLN, Take 296 mLs (1 Bottle total) by mouth once for 1 dose., Disp: 195 mL, Rfl: 0 .  Na Sulfate-K Sulfate-Mg Sulf 17.5-3.13-1.6 GM/177ML SOLN, Take 354 mLs by mouth once for 1 dose., Disp: 354 mL, Rfl: 0   Family History  Problem Relation Age of Onset  . COPD Mother   . Cancer Mother   .  Heart disease Father      Social History   Tobacco Use  . Smoking status: Former Smoker    Packs/day: 1.00    Years: 15.00    Pack years: 15.00    Types: Cigarettes    Quit date: 05/03/1978    Years since quitting: 41.7  . Smokeless tobacco: Never Used  . Tobacco comment: started smoking at age 11  Vaping Use  . Vaping Use: Never used  Substance Use Topics  . Alcohol use: No    Alcohol/week: 0.0 standard drinks  . Drug use: No    Allergies as of 01/30/2020 - Review Complete 01/30/2020  Allergen Reaction Noted  . Mucinex [guaifenesin er] Swelling 02/12/2015  . Levaquin [levofloxacin] Palpitations 05/14/2015    Review of Systems:    All systems reviewed and negative except where noted in HPI.   Physical Exam:  BP 136/72 (BP Location: Left Arm, Patient Position: Sitting, Cuff Size: Large)   Pulse 86   Temp 98.6 F (37 C) (Oral)   Wt (!) 301 lb 6 oz (136.7 kg)   BMI 42.03 kg/m  No LMP for male patient.  General:   Alert,  Well-developed,  well-nourished, pleasant and cooperative in NAD Head:  Normocephalic and atraumatic. Eyes:  Sclera clear, no icterus.   Conjunctiva pink. Ears:  Normal auditory acuity. Nose:  No deformity, discharge, or lesions. Mouth:  No deformity or lesions,oropharynx pink & moist. Neck:  Supple; no masses or thyromegaly. Lungs:  Respirations even and unlabored.  Clear throughout to auscultation.   No wheezes, crackles, or rhonchi. No acute distress. Heart:  Irregular rate and rhythm; no murmurs, clicks, rubs, or gallops. Abdomen:  Normal bowel sounds. Soft, non-tender and non-distended without masses, hepatosplenomegaly or hernias noted.  No guarding or rebound tenderness.   Rectal: Not performed Msk:  Symmetrical without gross deformities. Good, equal movement & strength bilaterally. Pulses:  Normal pulses noted. Extremities:  No clubbing or edema.  No cyanosis. Neurologic:  Alert and oriented x3;  grossly normal neurologically. Skin:  Intact without significant lesions or rashes. No jaundice. Psych:  Alert and cooperative. Normal mood and affect.  Imaging Studies: Reviewed  Assessment and Plan:   APOLINAR BERO is a 64 y.o. male with morbid obesity, diastolic heart failure, metabolic syndrome, A. fib on Eliquis, NASH cirrhosis ESRD on peritoneal dialysis with intermittent hemodialysis, history of melena and acute on chronic anemia  Acute on chronic anemia, history of melena Bidirectional endoscopy did not reveal source of anemia Recommend video capsule endoscopy  Colon cancer screening Recommend colonoscopy with 2-day prep Previous colonoscopy in 12/2019 was inadequate Patient will need to be of Eliquis for 48 to 72 hours prior to colonoscopy  I have discussed alternative options, risks & benefits,  which include, but are not limited to, bleeding, infection, perforation,respiratory complication & drug reaction.  The patient agrees with this plan & written consent will be obtained.      Follow up based on the above work-up   [name], MD

## 2020-01-30 NOTE — Telephone Encounter (Signed)
   Primary Cardiologist: No primary care provider on file.  Chart reviewed as part of pre-operative protocol coverage.  Patient is not followed by our practice. Per chart, he is followed by Darylene Price with the [facility] heart failure program and also previously seen by Dr. Clayborn Bigness. Neither are part of [place], so would recommend reaching out to his primary cardiology providers for input. Will route to requesting party and remove from pre-op box.  Charlie Pitter, PA-C 01/30/2020, 5:18 PM

## 2020-01-30 NOTE — Telephone Encounter (Signed)
° °  [facility] Pre-operative Risk Assessment    Request for surgical clearance:  1. What type of surgery is being performed? Colonoscopy and Video capsule   2. When is this surgery scheduled? Colonoscopy on 02/27/2020 video capsule for 02/25/2020  3. Are there any medications that need to be held prior to surgery and how long? Eliquis 2.52m    4. Practice name and name of physician performing surgery? [facility] and Dr. VMarius Ditch 5. What is your office phone and fax number? [phone] [phone]  6. Anesthesia type (None, local, MAC, general) ? General for colonoscopy Video capsule none    Patrick Mattocks9/29/2021, 4:25 PM  _________________________________________________________________   (provider comments below)

## 2020-02-01 ENCOUNTER — Telehealth: Payer: Self-pay

## 2020-02-01 NOTE — Telephone Encounter (Signed)
Per Cardiology they want patient to stop the Eliquis 5 days prior to procedure and restart it 2 days prior to procedure. Called patient and patient verbalized understanding of instructions

## 2020-02-04 ENCOUNTER — Ambulatory Visit: Payer: Self-pay

## 2020-02-04 NOTE — Telephone Encounter (Signed)
Patient called stating that for 3-4 weeks he has been passing out when he stands up He states that he never know it is going to happen His wife states that he does not shake or jerking. He just goes out for a second. Patient states that he checks his sugar and O2 and they are always normal just after the episode. He does home dialysis every night. He states that he has been told he has low white blood cells. Per protocol patient will go to ER for evaluation. Note will be routed to office.  Reason for Disposition . Patient sounds very sick or weak to the triager  Answer Assessment - Initial Assessment Questions 1. SYMPTOM: "What is the main symptom you are concerned about?" (e.g., weakness, numbness)     Passing out 2. ONSET: "When did this start?" (minutes, hours, days; while sleeping)   3-4 weeks ago and has fallen 3. LAST NORMAL: "When was the last time you were normal (no symptoms)?"    Came out of hospital 4. PATTERN "Does this come and go, or has it been constant since it started?"  "Is it present now?"     everytime he stands up 5. CARDIAC SYMPTOMS: "Have you had any of the following symptoms: chest pain, difficulty breathing, palpitations?"     none 6. NEUROLOGIC SYMPTOMS: "Have you had any of the following symptoms: headache, dizziness, vision loss, double vision, changes in speech, unsteady on your feet?"     Blurry vision and knees buckle 7. OTHER SYMPTOMS: "Do you have any other symptoms?"     none 8. PREGNANCY: "Is there any chance you are pregnant?" "When was your last menstrual period?"     N/A  Protocols used: NEUROLOGIC DEFICIT-A-AH

## 2020-02-05 ENCOUNTER — Other Ambulatory Visit: Payer: Self-pay | Admitting: Family Medicine

## 2020-02-05 NOTE — Telephone Encounter (Signed)
Requested Prescriptions  Pending Prescriptions Disp Refills  . pantoprazole (PROTONIX) 40 MG tablet [Pharmacy Med Name: PANTOPRAZOLE SODIUM DR TABS 40MG ] 180 tablet 3    Sig: TAKE 1 TABLET TWICE A DAY     Gastroenterology: Proton Pump Inhibitors Passed - 02/05/2020 12:37 AM      Passed - Valid encounter within last 12 months    Recent Outpatient Visits          4 weeks ago ESRD on dialysis [facility])   [facility] [name], MD   2 months ago Chronic diastolic CHF (congestive heart failure) [facility])   [place] [facility], Kelby Aline, FNP   2 months ago Type 2 diabetes mellitus with diabetic neuropathy, with long-term current use of insulin [facility])   [facility] [name], MD   6 months ago Type 2 diabetes mellitus with diabetic neuropathy, with long-term current use of insulin [facility])   [facility] [name], MD   10 months ago Multifocal pneumonia   [facility] [name], MD      Future Appointments            In 1 month Fisher, Kirstie Peri, MD [facility], [place]   In 1 month Fisher, Kirstie Peri, MD [facility], [place]

## 2020-02-09 ENCOUNTER — Other Ambulatory Visit: Payer: Self-pay | Admitting: Family Medicine

## 2020-02-09 DIAGNOSIS — E1140 Type 2 diabetes mellitus with diabetic neuropathy, unspecified: Secondary | ICD-10-CM

## 2020-02-09 DIAGNOSIS — Z794 Long term (current) use of insulin: Secondary | ICD-10-CM

## 2020-02-12 ENCOUNTER — Other Ambulatory Visit
Admission: RE | Admit: 2020-02-12 | Discharge: 2020-02-12 | Disposition: A | Discharge status: Home / Self Care | Payer: Commercial Managed Care - PPO | Source: Ambulatory Visit | Attending: Gastroenterology | Admitting: Gastroenterology

## 2020-02-12 ENCOUNTER — Other Ambulatory Visit: Payer: Self-pay

## 2020-02-12 DIAGNOSIS — Z20822 Contact with and (suspected) exposure to covid-19: Secondary | ICD-10-CM | POA: Insufficient documentation

## 2020-02-12 DIAGNOSIS — Z01812 Encounter for preprocedural laboratory examination: Secondary | ICD-10-CM | POA: Insufficient documentation

## 2020-02-13 LAB — SARS CORONAVIRUS 2 (TAT 6-24 HRS): SARS Coronavirus 2: NEGATIVE

## 2020-02-14 ENCOUNTER — Inpatient Hospital Stay: Payer: Commercial Managed Care - PPO

## 2020-02-14 ENCOUNTER — Ambulatory Visit: Payer: Commercial Managed Care - PPO | Admitting: Anesthesiology

## 2020-02-14 ENCOUNTER — Encounter: Admission: AD | Disposition: E | Payer: Self-pay | Source: Home / Self Care | Attending: Internal Medicine

## 2020-02-14 ENCOUNTER — Other Ambulatory Visit: Payer: Self-pay

## 2020-02-14 ENCOUNTER — Inpatient Hospital Stay
Admission: AD | Admit: 2020-02-14 | Discharge: 2020-03-03 | DRG: 302 | Disposition: E | Payer: Commercial Managed Care - PPO | Attending: Internal Medicine | Admitting: Internal Medicine

## 2020-02-14 ENCOUNTER — Encounter: Payer: Self-pay | Admitting: Gastroenterology

## 2020-02-14 ENCOUNTER — Inpatient Hospital Stay (HOSPITAL_COMMUNITY)
Admission: RE | Admit: 2020-02-14 | Discharge: 2020-02-14 | Disposition: A | Discharge status: Home / Self Care | Payer: Commercial Managed Care - PPO | Source: Home / Self Care | Attending: Pulmonary Disease | Admitting: Pulmonary Disease

## 2020-02-14 DIAGNOSIS — Z515 Encounter for palliative care: Secondary | ICD-10-CM

## 2020-02-14 DIAGNOSIS — I4892 Unspecified atrial flutter: Secondary | ICD-10-CM | POA: Diagnosis not present

## 2020-02-14 DIAGNOSIS — R570 Cardiogenic shock: Secondary | ICD-10-CM | POA: Diagnosis not present

## 2020-02-14 DIAGNOSIS — I462 Cardiac arrest due to underlying cardiac condition: Secondary | ICD-10-CM | POA: Diagnosis not present

## 2020-02-14 DIAGNOSIS — Z992 Dependence on renal dialysis: Secondary | ICD-10-CM | POA: Diagnosis not present

## 2020-02-14 DIAGNOSIS — Z7902 Long term (current) use of antithrombotics/antiplatelets: Secondary | ICD-10-CM

## 2020-02-14 DIAGNOSIS — I469 Cardiac arrest, cause unspecified: Secondary | ICD-10-CM

## 2020-02-14 DIAGNOSIS — R403 Persistent vegetative state: Secondary | ICD-10-CM | POA: Diagnosis not present

## 2020-02-14 DIAGNOSIS — E875 Hyperkalemia: Secondary | ICD-10-CM | POA: Diagnosis present

## 2020-02-14 DIAGNOSIS — Z9981 Dependence on supplemental oxygen: Secondary | ICD-10-CM

## 2020-02-14 DIAGNOSIS — G931 Anoxic brain damage, not elsewhere classified: Secondary | ICD-10-CM | POA: Diagnosis present

## 2020-02-14 DIAGNOSIS — Z1211 Encounter for screening for malignant neoplasm of colon: Secondary | ICD-10-CM

## 2020-02-14 DIAGNOSIS — G253 Myoclonus: Secondary | ICD-10-CM | POA: Diagnosis not present

## 2020-02-14 DIAGNOSIS — L89312 Pressure ulcer of right buttock, stage 2: Secondary | ICD-10-CM | POA: Diagnosis not present

## 2020-02-14 DIAGNOSIS — I5043 Acute on chronic combined systolic (congestive) and diastolic (congestive) heart failure: Secondary | ICD-10-CM | POA: Diagnosis present

## 2020-02-14 DIAGNOSIS — L8990 Pressure ulcer of unspecified site, unspecified stage: Secondary | ICD-10-CM | POA: Insufficient documentation

## 2020-02-14 DIAGNOSIS — I132 Hypertensive heart and chronic kidney disease with heart failure and with stage 5 chronic kidney disease, or end stage renal disease: Secondary | ICD-10-CM | POA: Diagnosis present

## 2020-02-14 DIAGNOSIS — Z9842 Cataract extraction status, left eye: Secondary | ICD-10-CM

## 2020-02-14 DIAGNOSIS — Z7951 Long term (current) use of inhaled steroids: Secondary | ICD-10-CM

## 2020-02-14 DIAGNOSIS — D509 Iron deficiency anemia, unspecified: Secondary | ICD-10-CM

## 2020-02-14 DIAGNOSIS — K7581 Nonalcoholic steatohepatitis (NASH): Secondary | ICD-10-CM | POA: Diagnosis present

## 2020-02-14 DIAGNOSIS — I480 Paroxysmal atrial fibrillation: Secondary | ICD-10-CM | POA: Diagnosis present

## 2020-02-14 DIAGNOSIS — J9811 Atelectasis: Secondary | ICD-10-CM | POA: Diagnosis present

## 2020-02-14 DIAGNOSIS — E113599 Type 2 diabetes mellitus with proliferative diabetic retinopathy without macular edema, unspecified eye: Secondary | ICD-10-CM | POA: Diagnosis present

## 2020-02-14 DIAGNOSIS — N186 End stage renal disease: Secondary | ICD-10-CM | POA: Diagnosis present

## 2020-02-14 DIAGNOSIS — Z66 Do not resuscitate: Secondary | ICD-10-CM | POA: Diagnosis not present

## 2020-02-14 DIAGNOSIS — R0902 Hypoxemia: Secondary | ICD-10-CM

## 2020-02-14 DIAGNOSIS — G4733 Obstructive sleep apnea (adult) (pediatric): Secondary | ICD-10-CM | POA: Diagnosis present

## 2020-02-14 DIAGNOSIS — R188 Other ascites: Secondary | ICD-10-CM | POA: Diagnosis not present

## 2020-02-14 DIAGNOSIS — Z79890 Hormone replacement therapy (postmenopausal): Secondary | ICD-10-CM

## 2020-02-14 DIAGNOSIS — E1122 Type 2 diabetes mellitus with diabetic chronic kidney disease: Secondary | ICD-10-CM | POA: Diagnosis present

## 2020-02-14 DIAGNOSIS — K7460 Unspecified cirrhosis of liver: Secondary | ICD-10-CM | POA: Diagnosis not present

## 2020-02-14 DIAGNOSIS — E039 Hypothyroidism, unspecified: Secondary | ICD-10-CM | POA: Diagnosis present

## 2020-02-14 DIAGNOSIS — Z79899 Other long term (current) drug therapy: Secondary | ICD-10-CM

## 2020-02-14 DIAGNOSIS — G928 Other toxic encephalopathy: Secondary | ICD-10-CM | POA: Diagnosis not present

## 2020-02-14 DIAGNOSIS — J690 Pneumonitis due to inhalation of food and vomit: Secondary | ICD-10-CM | POA: Diagnosis not present

## 2020-02-14 DIAGNOSIS — Z87891 Personal history of nicotine dependence: Secondary | ICD-10-CM

## 2020-02-14 DIAGNOSIS — J80 Acute respiratory distress syndrome: Secondary | ICD-10-CM | POA: Diagnosis present

## 2020-02-14 DIAGNOSIS — F32A Depression, unspecified: Secondary | ICD-10-CM | POA: Diagnosis present

## 2020-02-14 DIAGNOSIS — I255 Ischemic cardiomyopathy: Secondary | ICD-10-CM | POA: Diagnosis present

## 2020-02-14 DIAGNOSIS — Z6841 Body Mass Index (BMI) 40.0 and over, adult: Secondary | ICD-10-CM | POA: Diagnosis not present

## 2020-02-14 DIAGNOSIS — Z20822 Contact with and (suspected) exposure to covid-19: Secondary | ICD-10-CM | POA: Diagnosis present

## 2020-02-14 DIAGNOSIS — Z825 Family history of asthma and other chronic lower respiratory diseases: Secondary | ICD-10-CM

## 2020-02-14 DIAGNOSIS — D631 Anemia in chronic kidney disease: Secondary | ICD-10-CM | POA: Diagnosis present

## 2020-02-14 DIAGNOSIS — G9382 Brain death: Secondary | ICD-10-CM | POA: Diagnosis not present

## 2020-02-14 DIAGNOSIS — Z9911 Dependence on respirator [ventilator] status: Secondary | ICD-10-CM | POA: Diagnosis not present

## 2020-02-14 DIAGNOSIS — N2581 Secondary hyperparathyroidism of renal origin: Secondary | ICD-10-CM | POA: Diagnosis present

## 2020-02-14 DIAGNOSIS — K219 Gastro-esophageal reflux disease without esophagitis: Secondary | ICD-10-CM | POA: Diagnosis present

## 2020-02-14 DIAGNOSIS — Z01818 Encounter for other preprocedural examination: Secondary | ICD-10-CM

## 2020-02-14 DIAGNOSIS — Z794 Long term (current) use of insulin: Secondary | ICD-10-CM

## 2020-02-14 DIAGNOSIS — G510 Bell's palsy: Secondary | ICD-10-CM | POA: Diagnosis present

## 2020-02-14 DIAGNOSIS — J449 Chronic obstructive pulmonary disease, unspecified: Secondary | ICD-10-CM | POA: Diagnosis present

## 2020-02-14 DIAGNOSIS — Z7189 Other specified counseling: Secondary | ICD-10-CM | POA: Diagnosis not present

## 2020-02-14 DIAGNOSIS — Z881 Allergy status to other antibiotic agents status: Secondary | ICD-10-CM

## 2020-02-14 DIAGNOSIS — R0602 Shortness of breath: Secondary | ICD-10-CM

## 2020-02-14 DIAGNOSIS — M109 Gout, unspecified: Secondary | ICD-10-CM | POA: Diagnosis present

## 2020-02-14 DIAGNOSIS — J9600 Acute respiratory failure, unspecified whether with hypoxia or hypercapnia: Secondary | ICD-10-CM

## 2020-02-14 DIAGNOSIS — J9601 Acute respiratory failure with hypoxia: Secondary | ICD-10-CM | POA: Diagnosis not present

## 2020-02-14 DIAGNOSIS — Z888 Allergy status to other drugs, medicaments and biological substances status: Secondary | ICD-10-CM

## 2020-02-14 DIAGNOSIS — Z9841 Cataract extraction status, right eye: Secondary | ICD-10-CM

## 2020-02-14 DIAGNOSIS — I2510 Atherosclerotic heart disease of native coronary artery without angina pectoris: Secondary | ICD-10-CM | POA: Diagnosis present

## 2020-02-14 DIAGNOSIS — Z4659 Encounter for fitting and adjustment of other gastrointestinal appliance and device: Secondary | ICD-10-CM

## 2020-02-14 DIAGNOSIS — Z8249 Family history of ischemic heart disease and other diseases of the circulatory system: Secondary | ICD-10-CM

## 2020-02-14 LAB — COMPREHENSIVE METABOLIC PANEL
ALT: 14 U/L (ref 0–44)
AST: 17 U/L (ref 15–41)
Albumin: 3.2 g/dL — ABNORMAL LOW (ref 3.5–5.0)
Alkaline Phosphatase: 73 U/L (ref 38–126)
Anion gap: 32 — ABNORMAL HIGH (ref 5–15)
BUN: 91 mg/dL — ABNORMAL HIGH (ref 8–23)
CO2: 23 mmol/L (ref 22–32)
Calcium: 7.3 mg/dL — ABNORMAL LOW (ref 8.9–10.3)
Chloride: 85 mmol/L — ABNORMAL LOW (ref 98–111)
Creatinine, Ser: 19.17 mg/dL — ABNORMAL HIGH (ref 0.61–1.24)
GFR, Estimated: 2 mL/min — ABNORMAL LOW (ref >60)
Glucose, Bld: 316 mg/dL — ABNORMAL HIGH (ref 70–99)
Potassium: 5.9 mmol/L — ABNORMAL HIGH (ref 3.5–5.1)
Sodium: 140 mmol/L (ref 135–145)
Total Bilirubin: 1 mg/dL (ref 0.3–1.2)
Total Protein: 7.4 g/dL (ref 6.5–8.1)

## 2020-02-14 LAB — TROPONIN I (HIGH SENSITIVITY)
Troponin I (High Sensitivity): 169 ng/L (ref <18)
Troponin I (High Sensitivity): 175 ng/L (ref <18)
Troponin I (High Sensitivity): 193 ng/L (ref <18)

## 2020-02-14 LAB — BLOOD GAS, ARTERIAL
Acid-base deficit: 2.6 mmol/L — ABNORMAL HIGH (ref 0.0–2.0)
Bicarbonate: 23.2 mmol/L (ref 20.0–28.0)
FIO2: 0.45
MECHVT: 550 mL
O2 Saturation: 96.6 %
PEEP: 5 cmH2O
Patient temperature: 35.9
RATE: 24 resp/min
pCO2 arterial: 42 mmHg (ref 32.0–48.0)
pH, Arterial: 7.35 (ref 7.350–7.450)
pO2, Arterial: 87 mmHg (ref 83.0–108.0)

## 2020-02-14 LAB — CBC WITH DIFFERENTIAL/PLATELET
Abs Immature Granulocytes: 0.17 10*3/uL — ABNORMAL HIGH (ref 0.00–0.07)
Basophils Absolute: 0 10*3/uL (ref 0.0–0.1)
Basophils Relative: 1 %
Eosinophils Absolute: 0.2 10*3/uL (ref 0.0–0.5)
Eosinophils Relative: 3 %
HCT: 19.4 % — ABNORMAL LOW (ref 39.0–52.0)
Hemoglobin: 6.1 g/dL — ABNORMAL LOW (ref 13.0–17.0)
Immature Granulocytes: 3 %
Lymphocytes Relative: 10 %
Lymphs Abs: 0.6 10*3/uL — ABNORMAL LOW (ref 0.7–4.0)
MCH: 30.2 pg (ref 26.0–34.0)
MCHC: 31.4 g/dL (ref 30.0–36.0)
MCV: 96 fL (ref 80.0–100.0)
Monocytes Absolute: 0.5 10*3/uL (ref 0.1–1.0)
Monocytes Relative: 7 %
Neutro Abs: 5 10*3/uL (ref 1.7–7.7)
Neutrophils Relative %: 76 %
Platelets: 124 10*3/uL — ABNORMAL LOW (ref 150–400)
RBC: 2.02 MIL/uL — ABNORMAL LOW (ref 4.22–5.81)
RDW: 16.1 % — ABNORMAL HIGH (ref 11.5–15.5)
WBC: 6.4 10*3/uL (ref 4.0–10.5)
nRBC: 0.3 % — ABNORMAL HIGH (ref 0.0–0.2)

## 2020-02-14 LAB — BRAIN NATRIURETIC PEPTIDE: B Natriuretic Peptide: 116.9 pg/mL — ABNORMAL HIGH (ref 0.0–100.0)

## 2020-02-14 LAB — MRSA PCR SCREENING: MRSA by PCR: NEGATIVE

## 2020-02-14 LAB — ECHOCARDIOGRAM COMPLETE
AR max vel: 2.03 cm2
AV Area VTI: 2.12 cm2
AV Area mean vel: 1.97 cm2
AV Mean grad: 5.5 mmHg
AV Peak grad: 10.2 mmHg
Ao pk vel: 1.6 m/s
Area-P 1/2: 2.48 cm2
Height: 72 in
S' Lateral: 2.94 cm
Weight: 4627.2 oz

## 2020-02-14 LAB — PREPARE RBC (CROSSMATCH)

## 2020-02-14 LAB — GLUCOSE, CAPILLARY
Glucose-Capillary: 156 mg/dL — ABNORMAL HIGH (ref 70–99)
Glucose-Capillary: 162 mg/dL — ABNORMAL HIGH (ref 70–99)
Glucose-Capillary: 195 mg/dL — ABNORMAL HIGH (ref 70–99)
Glucose-Capillary: 200 mg/dL — ABNORMAL HIGH (ref 70–99)
Glucose-Capillary: 302 mg/dL — ABNORMAL HIGH (ref 70–99)

## 2020-02-14 LAB — PROCALCITONIN: Procalcitonin: 0.87 ng/mL

## 2020-02-14 LAB — LACTIC ACID, PLASMA
Lactic Acid, Venous: 3 mmol/L (ref 0.5–1.9)
Lactic Acid, Venous: 1.7 mmol/L (ref 0.5–1.9)

## 2020-02-14 LAB — MAGNESIUM: Magnesium: 2.8 mg/dL — ABNORMAL HIGH (ref 1.7–2.4)

## 2020-02-14 LAB — CKMB (ARMC ONLY): CK, MB: 33 ng/mL — ABNORMAL HIGH (ref 0.5–5.0)

## 2020-02-14 LAB — PHOSPHORUS: Phosphorus: 15.8 mg/dL — ABNORMAL HIGH (ref 2.5–4.6)

## 2020-02-14 LAB — PROTIME-INR
INR: 1.3 — ABNORMAL HIGH (ref 0.8–1.2)
Prothrombin Time: 15.2 seconds (ref 11.4–15.2)

## 2020-02-14 LAB — APTT: aPTT: 32 seconds (ref 24–36)

## 2020-02-14 SURGERY — COLONOSCOPY WITH PROPOFOL
Anesthesia: General

## 2020-02-14 MED ORDER — SODIUM CHLORIDE 0.9 % IV SOLN: 250.0000 mL | INTRAVENOUS | Status: DC

## 2020-02-14 MED ORDER — INSULIN ASPART 100 UNIT/ML ~~LOC~~ SOLN: 1.0000 [IU] | SUBCUTANEOUS | Status: DC

## 2020-02-14 MED ORDER — ORAL CARE MOUTH RINSE
15.0000 mL | OROMUCOSAL | Status: DC
  Administered 2020-02-14 – 2020-02-23 (×82): 15 mL via OROMUCOSAL

## 2020-02-14 MED ORDER — EPINEPHRINE 1 MG/10ML IJ SOSY
PREFILLED_SYRINGE | INTRAMUSCULAR | Status: AC
  Filled 2020-02-14: qty 10

## 2020-02-14 MED ORDER — HEPARIN SODIUM (PORCINE) 5000 UNIT/ML IJ SOLN: 5000.0000 [IU] | Freq: Three times a day (TID) | INTRAMUSCULAR | Status: DC

## 2020-02-14 MED ORDER — PHENYLEPHRINE HCL (PRESSORS) 10 MG/ML IV SOLN
INTRAVENOUS | Status: DC | PRN
  Administered 2020-02-14: 200 ug via INTRAVENOUS

## 2020-02-14 MED ORDER — DOCUSATE SODIUM 50 MG/5ML PO LIQD
100.0000 mg | Freq: Two times a day (BID) | ORAL | Status: DC
  Administered 2020-02-14 – 2020-02-20 (×10): 100 mg
  Filled 2020-02-14 (×12): qty 10

## 2020-02-14 MED ORDER — ONDANSETRON HCL 4 MG/2ML IJ SOLN: 4.0000 mg | Freq: Four times a day (QID) | INTRAMUSCULAR | Status: DC | PRN

## 2020-02-14 MED ORDER — NOREPINEPHRINE 4 MG/250ML-% IV SOLN: 2.0000 ug/min | INTRAVENOUS | Status: DC

## 2020-02-14 MED ORDER — FENTANYL BOLUS VIA INFUSION
50.0000 ug | INTRAVENOUS | Status: DC | PRN
  Administered 2020-02-20: 50 ug via INTRAVENOUS
  Filled 2020-02-14: qty 50

## 2020-02-14 MED ORDER — SODIUM CHLORIDE 0.9 % IV SOLN: INTRAVENOUS | Status: DC

## 2020-02-14 MED ORDER — PROPOFOL 1000 MG/100ML IV EMUL
25.0000 ug/kg/min | INTRAVENOUS | Status: DC
  Administered 2020-02-14: 30 ug/kg/min via INTRAVENOUS
  Administered 2020-02-14: 40 ug/kg/min via INTRAVENOUS
  Administered 2020-02-14 (×3): 50 ug/kg/min via INTRAVENOUS
  Administered 2020-02-15: 39.939 ug/kg/min via INTRAVENOUS
  Administered 2020-02-15 (×4): 35 ug/kg/min via INTRAVENOUS
  Administered 2020-02-15: 50 ug/kg/min via INTRAVENOUS
  Administered 2020-02-15: 35 ug/kg/min via INTRAVENOUS
  Administered 2020-02-16: 45 ug/kg/min via INTRAVENOUS
  Administered 2020-02-16: 50 ug/kg/min via INTRAVENOUS
  Administered 2020-02-16: 40 ug/kg/min via INTRAVENOUS
  Administered 2020-02-16: 30 ug/kg/min via INTRAVENOUS
  Administered 2020-02-16: 40 ug/kg/min via INTRAVENOUS
  Administered 2020-02-16 (×4): 50 ug/kg/min via INTRAVENOUS
  Administered 2020-02-17: 25 ug/kg/min via INTRAVENOUS
  Administered 2020-02-17 (×2): 30 ug/kg/min via INTRAVENOUS
  Administered 2020-02-18 – 2020-02-19 (×4): 25 ug/kg/min via INTRAVENOUS
  Filled 2020-02-14 (×27): qty 100

## 2020-02-14 MED ORDER — PROPOFOL 500 MG/50ML IV EMUL
INTRAVENOUS | Status: AC
  Filled 2020-02-14: qty 50

## 2020-02-14 MED ORDER — CHLORHEXIDINE GLUCONATE CLOTH 2 % EX PADS
6.0000 | MEDICATED_PAD | Freq: Every day | CUTANEOUS | Status: DC
  Administered 2020-02-16 – 2020-02-22 (×6): 6 via TOPICAL

## 2020-02-14 MED ORDER — POLYETHYLENE GLYCOL 3350 17 G PO PACK: 17.0000 g | PACK | Freq: Every day | ORAL | Status: DC | PRN

## 2020-02-14 MED ORDER — DOCUSATE SODIUM 100 MG PO CAPS: 100.0000 mg | ORAL_CAPSULE | Freq: Two times a day (BID) | ORAL | Status: DC | PRN

## 2020-02-14 MED ORDER — ASPIRIN 300 MG RE SUPP: 300.0000 mg | RECTAL | Status: DC

## 2020-02-14 MED ORDER — MIDAZOLAM HCL 2 MG/2ML IJ SOLN
INTRAMUSCULAR | Status: AC
  Filled 2020-02-14: qty 2

## 2020-02-14 MED ORDER — FAMOTIDINE IN NACL 20-0.9 MG/50ML-% IV SOLN: 20.0000 mg | Freq: Two times a day (BID) | INTRAVENOUS | Status: DC

## 2020-02-14 MED ORDER — PATIROMER SORBITEX CALCIUM 8.4 G PO PACK: 25.2000 g | PACK | Freq: Once | ORAL | Status: DC

## 2020-02-14 MED ORDER — MIDAZOLAM HCL 2 MG/2ML IJ SOLN
2.0000 mg | INTRAMUSCULAR | Status: DC | PRN
  Administered 2020-02-15 – 2020-02-22 (×15): 2 mg via INTRAVENOUS
  Filled 2020-02-14 (×14): qty 2

## 2020-02-14 MED ORDER — PROPOFOL 500 MG/50ML IV EMUL
INTRAVENOUS | Status: DC | PRN
  Administered 2020-02-14: 150 ug/kg/min via INTRAVENOUS

## 2020-02-14 MED ORDER — SODIUM CHLORIDE 0.9% IV SOLUTION: Freq: Once | INTRAVENOUS | Status: DC

## 2020-02-14 MED ORDER — DELFLEX-LC/1.5% DEXTROSE 344 MOSM/L IP SOLN
INTRAPERITONEAL | Status: DC
  Filled 2020-02-14: qty 3000

## 2020-02-14 MED ORDER — INSULIN ASPART 100 UNIT/ML ~~LOC~~ SOLN: 3.0000 [IU] | SUBCUTANEOUS | Status: DC

## 2020-02-14 MED ORDER — POLYETHYLENE GLYCOL 3350 17 G PO PACK
17.0000 g | PACK | Freq: Every day | ORAL | Status: DC
  Administered 2020-02-15 – 2020-02-20 (×6): 17 g
  Filled 2020-02-14 (×7): qty 1

## 2020-02-14 MED ORDER — FENTANYL 2500MCG IN NS 250ML (10MCG/ML) PREMIX INFUSION
100.0000 ug/h | INTRAVENOUS | Status: DC
  Administered 2020-02-14: 75 ug/h via INTRAVENOUS
  Administered 2020-02-15: 175 ug/h via INTRAVENOUS
  Administered 2020-02-15: 150 ug/h via INTRAVENOUS
  Administered 2020-02-16 (×2): 200 ug/h via INTRAVENOUS
  Administered 2020-02-17: 50 ug/h via INTRAVENOUS
  Administered 2020-02-18: 100 ug/h via INTRAVENOUS
  Administered 2020-02-19 – 2020-02-22 (×7): 250 ug/h via INTRAVENOUS
  Administered 2020-02-22: 200 ug/h via INTRAVENOUS
  Administered 2020-02-23: 150 ug/h via INTRAVENOUS
  Filled 2020-02-14 (×16): qty 250

## 2020-02-14 MED ORDER — FENTANYL CITRATE (PF) 100 MCG/2ML IJ SOLN: 100.0000 ug | Freq: Once | INTRAMUSCULAR | Status: DC

## 2020-02-14 MED ORDER — HEPARIN SODIUM (PORCINE) 5000 UNIT/ML IJ SOLN
5000.0000 [IU] | Freq: Three times a day (TID) | INTRAMUSCULAR | Status: DC
  Administered 2020-02-14 – 2020-02-23 (×26): 5000 [IU] via SUBCUTANEOUS
  Filled 2020-02-14 (×26): qty 1

## 2020-02-14 MED ORDER — PATIROMER SORBITEX CALCIUM 8.4 G PO PACK
25.2000 g | PACK | Freq: Every day | ORAL | Status: DC
  Filled 2020-02-14: qty 3

## 2020-02-14 MED ORDER — SODIUM ZIRCONIUM CYCLOSILICATE 5 G PO PACK
10.0000 g | PACK | Freq: Every day | ORAL | Status: DC
  Administered 2020-02-14 – 2020-02-15 (×2): 10 g
  Filled 2020-02-14 (×2): qty 2

## 2020-02-14 MED ORDER — MIDAZOLAM HCL 2 MG/2ML IJ SOLN
INTRAMUSCULAR | Status: DC | PRN
  Administered 2020-02-14: 2 mg via INTRAVENOUS

## 2020-02-14 MED ORDER — GENTAMICIN SULFATE 0.1 % EX CREA
1.0000 "application " | TOPICAL_CREAM | Freq: Every day | CUTANEOUS | Status: DC
  Administered 2020-02-15 – 2020-02-23 (×9): 1 via TOPICAL
  Filled 2020-02-14 (×2): qty 15

## 2020-02-14 MED ORDER — DOCUSATE SODIUM 50 MG/5ML PO LIQD
100.0000 mg | Freq: Two times a day (BID) | ORAL | Status: DC
  Administered 2020-02-14: 100 mg via ORAL
  Filled 2020-02-14: qty 10

## 2020-02-14 MED ORDER — FAMOTIDINE IN NACL 20-0.9 MG/50ML-% IV SOLN
20.0000 mg | INTRAVENOUS | Status: DC
  Administered 2020-02-14 – 2020-02-22 (×8): 20 mg via INTRAVENOUS
  Filled 2020-02-14 (×8): qty 50

## 2020-02-14 MED ORDER — POLYETHYLENE GLYCOL 3350 17 G PO PACK
17.0000 g | PACK | Freq: Every day | ORAL | Status: DC
  Administered 2020-02-14: 17 g via ORAL
  Filled 2020-02-14: qty 1

## 2020-02-14 MED ORDER — INSULIN ASPART 100 UNIT/ML ~~LOC~~ SOLN
0.0000 [IU] | SUBCUTANEOUS | Status: DC
  Administered 2020-02-14: 4 [IU] via SUBCUTANEOUS
  Administered 2020-02-14 – 2020-02-18 (×11): 1 [IU] via SUBCUTANEOUS
  Administered 2020-02-18 (×2): 2 [IU] via SUBCUTANEOUS
  Administered 2020-02-18: 3 [IU] via SUBCUTANEOUS
  Administered 2020-02-18 (×2): 1 [IU] via SUBCUTANEOUS
  Administered 2020-02-19 (×4): 2 [IU] via SUBCUTANEOUS
  Administered 2020-02-19: 3 [IU] via SUBCUTANEOUS
  Administered 2020-02-20: 2 [IU] via SUBCUTANEOUS
  Administered 2020-02-20 (×4): 1 [IU] via SUBCUTANEOUS
  Administered 2020-02-20 (×2): 2 [IU] via SUBCUTANEOUS
  Administered 2020-02-21 (×2): 1 [IU] via SUBCUTANEOUS
  Administered 2020-02-21 (×3): 2 [IU] via SUBCUTANEOUS
  Administered 2020-02-21 – 2020-02-22 (×3): 1 [IU] via SUBCUTANEOUS
  Administered 2020-02-22: 2 [IU] via SUBCUTANEOUS
  Administered 2020-02-22: 1 [IU] via SUBCUTANEOUS
  Administered 2020-02-23: 4 [IU] via SUBCUTANEOUS
  Administered 2020-02-23: 5 [IU] via SUBCUTANEOUS
  Administered 2020-02-23: 4 [IU] via SUBCUTANEOUS
  Filled 2020-02-14 (×42): qty 1

## 2020-02-14 MED ORDER — ASPIRIN 81 MG PO CHEW
324.0000 mg | CHEWABLE_TABLET | Freq: Once | ORAL | Status: AC
  Administered 2020-02-14: 324 mg via ORAL
  Filled 2020-02-14: qty 4

## 2020-02-14 MED ORDER — CHLORHEXIDINE GLUCONATE 0.12% ORAL RINSE (MEDLINE KIT)
15.0000 mL | Freq: Two times a day (BID) | OROMUCOSAL | Status: DC
  Administered 2020-02-14 – 2020-02-23 (×18): 15 mL via OROMUCOSAL

## 2020-02-14 MED ORDER — NOREPINEPHRINE 4 MG/250ML-% IV SOLN
0.0000 ug/min | INTRAVENOUS | Status: DC
  Administered 2020-02-14: 10 ug/min via INTRAVENOUS
  Administered 2020-02-14: 2 ug/min via INTRAVENOUS
  Administered 2020-02-14: 10 ug/min via INTRAVENOUS
  Administered 2020-02-15: 4 ug/min via INTRAVENOUS
  Administered 2020-02-15 – 2020-02-16 (×2): 10 ug/min via INTRAVENOUS
  Administered 2020-02-16: 12 ug/min via INTRAVENOUS
  Administered 2020-02-16: 10 ug/min via INTRAVENOUS
  Administered 2020-02-16: 8 ug/min via INTRAVENOUS
  Administered 2020-02-17: 5 ug/min via INTRAVENOUS
  Administered 2020-02-18: 1 ug/min via INTRAVENOUS
  Administered 2020-02-22: 5 ug/min via INTRAVENOUS
  Filled 2020-02-14 (×9): qty 250

## 2020-02-14 NOTE — ED Provider Notes (Signed)
[place]  Department of Emergency Medicine   Code Blue CONSULT NOTE  Chief Complaint: Cardiac arrest/unresponsive   Level V Caveat: Unresponsive  History of present illness: I was contacted by the hospital for a CODE BLUE cardiac arrest upstairs and presented to the patient's bedside.  Upon arrival, patient's airway was already maintained with an endotracheal tube and ICU attending present at bedside directing care.  Patient did not need additional care for me at this time.  ROS: Unable to obtain, Level V caveat  Scheduled Meds:  docusate  100 mg Oral BID   EPINEPHrine       EPINEPHrine       heparin  5,000 Units Subcutaneous Q8H   polyethylene glycol  17 g Oral Daily   Continuous Infusions:  sodium chloride     famotidine (PEPCID) IV     norepinephrine (LEVOPHED) Adult infusion     PRN Meds:.docusate sodium, ondansetron (ZOFRAN) IV, polyethylene glycol Past Medical History:  Diagnosis Date   Acute on chronic diastolic CHF (congestive heart failure) ([place]) 06/21/2018   Acute respiratory failure with hypoxia ([place]) 06/21/2018   Bell palsy 02/12/2015   Carpal tunnel syndrome 02/12/2015   Dependence on nocturnal oxygen therapy    2 LITERS WITH BIPAP   Depression    Gastric ulcer    GERD (gastroesophageal reflux disease)    Gout    History of chicken pox    Multifocal pneumonia 03/04/2019   Sleep apnea treated with nocturnal BiPAP    uses bipap   Past Surgical History:  Procedure Laterality Date   AV FISTULA PLACEMENT Left 11/08/2019   Procedure: ARTERIOVENOUS (AV) FISTULA CREATION ([place]);  Surgeon: Algernon Huxley, MD;  Location: [facility] ORS;  Service: Vascular;  Laterality: Left;   CATARACT EXTRACTION Bilateral 2014 and 2015   CHOLECYSTECTOMY  04/28/2011   Laproscopic; Dr. Pat Patrick   COLONOSCOPY WITH PROPOFOL N/A 12/30/2019   Procedure: COLONOSCOPY WITH PROPOFOL;  Surgeon: Lin Landsman, MD;  Location: [facility] ENDOSCOPY;  Service:  Gastroenterology;  Laterality: N/A;   DIALYSIS/PERMA CATHETER INSERTION N/A 05/24/2019   Procedure: DIALYSIS/PERMA CATHETER INSERTION;  Surgeon: Algernon Huxley, MD;  Location: [place] CV LAB;  Service: Cardiovascular;  Laterality: N/A;   DIALYSIS/PERMA CATHETER INSERTION N/A 10/09/2019   Procedure: DIALYSIS/PERMA CATHETER INSERTION;  Surgeon: Katha Cabal, MD;  Location: [place] CV LAB;  Service: Cardiovascular;  Laterality: N/A;   ESOPHAGOGASTRODUODENOSCOPY (EGD) WITH PROPOFOL N/A 12/29/2019   Procedure: ESOPHAGOGASTRODUODENOSCOPY (EGD) WITH PROPOFOL;  Surgeon: Lin Landsman, MD;  Location: [facility] ENDOSCOPY;  Service: Gastroenterology;  Laterality: N/A;   EYE SURGERY     GALLBLADDER SURGERY     LASIK Bilateral 2019   medical   SHOULDER SURGERY Right 2012   Dr. Leanor Kail   Social History   Socioeconomic History   Marital status: Married    Spouse name: Hilda Blades   Number of children: Not on file   Years of education: Not on file   Highest education level: Not on file  Occupational History   Occupation: Works at [employer]    Comment: Full time  Tobacco Use   Smoking status: Former Smoker    Packs/day: 1.00    Years: 15.00    Pack years: 15.00    Types: Cigarettes    Quit date: 05/03/1978    Years since quitting: 41.8   Smokeless tobacco: Never Used   Tobacco comment: started smoking at age 6  Vaping Use   Vaping Use: Never  used  Substance and Sexual Activity   Alcohol use: No    Alcohol/week: 0.0 standard drinks   Drug use: No   Sexual activity: Not Currently  Other Topics Concern   Not on file  Social History Narrative   Not on file   Social Determinants of Health   Financial Resource Strain:    Difficulty of Paying Living Expenses: Not on file  Food Insecurity:    Worried About [place] in the Last Year: Not on file   Ran Out of Food in the Last Year: Not on file  Transportation Needs:    Lack of  Transportation (Medical): Not on file   Lack of Transportation (Non-Medical): Not on file  Physical Activity:    Days of Exercise per Week: Not on file   Minutes of Exercise per Session: Not on file  Stress:    Feeling of Stress : Not on file  Social Connections:    Frequency of Communication with Friends and Family: Not on file   Frequency of Social Gatherings with Friends and Family: Not on file   Attends Religious Services: Not on file   Active Member of Clubs or Organizations: Not on file   Attends Archivist Meetings: Not on file   Marital Status: Not on file  Intimate Partner Violence:    Fear of Current or Ex-Partner: Not on file   Emotionally Abused: Not on file   Physically Abused: Not on file   Sexually Abused: Not on file   Allergies  Allergen Reactions   Mucinex [Guaifenesin Er] Swelling    Throat swelling, increases heart rate   Levaquin [Levofloxacin] Palpitations    Last set of Vital Signs (not current) Vitals:   02/03/2020 0916  BP: 110/73  Pulse: 82  Resp: 18  Temp: 97.9 F (36.6 C)  SpO2: 96%      Physical Exam Gen: unresponsive Cardiovascular: pulseless  Resp: apneic. Breath sounds equal bilaterally with bagging  Abd: nondistended  Neuro: GCS 3, unresponsive to pain  HEENT: No blood in posterior pharynx, gag reflex absent  Neck: No crepitus  Musculoskeletal: No deformity  Skin: warm  CRITICAL CARE Performed by: Naaman Plummer Total critical care time: 46min Critical care time was exclusive of separately billable procedures and treating other patients. Critical care was necessary to treat or prevent imminent or life-threatening deterioration. Critical care was time spent personally by me on the following activities: development of treatment plan with patient and/or surrogate as well as nursing, discussions with consultants, evaluation of patient's response to treatment, examination of patient, obtaining history from  patient or surrogate, ordering and performing treatments and interventions, ordering and review of laboratory studies, ordering and review of radiographic studies, pulse oximetry and re-evaluation of patient's condition.  Cardiopulmonary Resuscitation (CPR) Procedure Note Directed/Performed by: Naaman Plummer I personally directed ancillary staff and/or performed CPR in an effort to regain return of spontaneous circulation and to maintain cardiac, neuro and systemic perfusion.    Medical Decision making  Per ICU physician  Assessment and Plan  Admit to ICU    Naaman Plummer, MD 02/04/2020 1154

## 2020-02-14 NOTE — Procedures (Signed)
Pt went for STAT CT-also messaged Kasa about the timeframe he wants study done.

## 2020-02-14 NOTE — Procedures (Addendum)
Central Venous Catheter Placement:TRIPLE LUMEN   Procedure: Insertion of Non-tunneled Central Venous Catheter(36556) with US guidance (59935)   Indication(s) Medication administration and Difficult access  CVP monitoring Patient receiving vesicant or irritant drug.; Patient receiving intravenous therapy for longer than 5 days.; Patient has limited or no vascular access.    Consent Risks of the procedure as well as the alternatives and risks of each were explained to the patient and/or caregiver.  Consent for the procedure was obtained and is signed in the bedside chart  Consent-verbal/written      Timeout Verified patient identification, verified procedure, site/side was marked, verified correct patient position, special equipment/implants available, medications/allergies/relevant history reviewed, required imaging and test results available. Patient comfort was obtained.     Sterile Technique Maximal sterile technique including full sterile barrier drape, hand hygiene, sterile gown, sterile gloves, mask, hair covering, sterile ultrasound probe cover (if used).   Hand washing performed prior to starting the procedure.    Procedure Description Area of catheter insertion was cleaned with chlorhexidine and draped in sterile fashion.  With real-time ultrasound guidance a central venous catheter was placed into the right internal jugular vein. Nonpulsatile blood flow and easy flushing noted in all ports.  The catheter was sutured in place and sterile dressing applied.   A triple lumen catheter was placed in RT [facility] Vein There was good blood return, catheter caps were placed on lumens, catheter flushed easily, the line was secured and a sterile dressing and BIO-PATCH applied.    Complications/Tolerance None; patient tolerated the procedure well. Chest X-ray is ordered to verify placement     EBL Minimal   Specimen(s) None   Number of Attempts:  1 Complications:none Estimated Blood Loss: none Operator: Nickey Canedo/Chuster   Corrin Parker, M.D.  [facility]  Medical Director [place] Director [place] Department

## 2020-02-14 NOTE — Procedures (Signed)
ELECTROENCEPHALOGRAM REPORT   Patient: Patrick Hurst       Room #: IC06A EEG No. ID: [id number] Age: 64 y.o.        Sex: male Requesting Physician: Kasa Report Date:  02/22/2020        Interpreting Physician: Alexis Goodell  History: [name] is an 64 y.o. male with PEA arrest during colonoscopy  Medications:  Fentanyl, Propofol, Levophed, ASA,   Conditions of Recording:  This is a 21 channel routine scalp EEG performed with bipolar and monopolar montages arranged in accordance to the international 10/20 system of electrode placement. One channel was dedicated to EKG recording.  The patient is in the intubated and sedated state.  Description:  The background activity is very low voltage and often obscured by low voltage artifact and electrocardiogram artifact.   At times some very slow polymorphic delta activity could be identified.  No epileptiform activity is noted.  The patient was stimulated during the recording with no activation of the background rhythm noted.   Hyperventilation and intermittent photic stimulation were not performed.   IMPRESSION: This electroencephalogram is characterized by very slow, low voltage background activity that is consistent with a medication effect, but can not rule out a severe diffuse cerebral disturbance as well, etiologically nonspecific.  Clinical correlation recommended.  No epileptiform activity is noted.     Alexis Goodell, MD Neurology  02/04/2020, 2:43 PM

## 2020-02-14 NOTE — Progress Notes (Signed)
Patient arrived to endoscopy unit for colonoscopy, indicated for colon cancer screening.  Patient went into cardiorespiratory arrest during colonoscopy secondary to hypoxia, PEA arrest, CPR was performed for 10 minutes with return of spontaneous circulation, patient is transferred to ICU, intubated  Patient's wife Roey Coopman has been updated regarding his critical medical condition, accompanied by chaplain  [name], MD [address]  [place]  [place], [place] [address]  Main: [phone]  Fax: [phone] Pager: [phone]

## 2020-02-14 NOTE — Progress Notes (Signed)
[facility]  ROUNDING NOTE   Subjective:   Patrick Hurst came in for a scheduled colonoscopy today, indicated for colon cancer screening, went in to cardiorespiratory arrest during the procedure. Cardiopulmonary resuscitation was initiated in endoscopy unit.He got transferred to ICU,intubated and sedated with Fentanyl and Propofol. Patient has h/o ESRD on peritoneal dialysis.    Objective:  Vital signs in last 24 hours:  Temp:  [97.8 F (36.6 C)-97.9 F (36.6 C)] 97.8 F (36.6 C) (10/14 1200) Pulse Rate:  [46-88] 46 (10/14 1447) Resp:  [16-22] 20 (10/14 1447) BP: (92-166)/(40-78) 162/69 (10/14 1447) SpO2:  [92 %-96 %] 94 % (10/14 1447) FiO2 (%):  [45 %] 45 % (10/14 1154) Weight:  [130.2 kg-131.2 kg] 131.2 kg (10/14 1200)  Weight change:  Filed Weights   02/17/2020 0916 02/22/2020 1200  Weight: 130.2 kg 131.2 kg    Intake/Output: No intake/output data recorded.   Intake/Output this shift:  No intake/output data recorded.  Physical Exam: General: Appears critically ill  Head: Normocephalic, atraumatic. ETT in place  Eyes: Anicteric  Lungs:  Ventilator assisted,PRVC FiO2 45%,Lungs diminished at the bases  Heart: Regular rate and rhythm  Abdomen:  Obese,Peritoneal catheter site with dressing clean,dry and intact  Extremities:  No  peripheral edema.  Neurologic: sedated  Skin: No acute lesions or rashes noted  Access: PD catheter    Basic Metabolic Panel: Recent Labs  Lab 02/07/2020 1228  NA 140  K 5.9*  CL 85*  CO2 23  GLUCOSE 316*  BUN 91*  CREATININE 19.17*  CALCIUM 7.3*  MG 2.8*  PHOS 15.8*    Liver Function Tests: Recent Labs  Lab 02/07/2020 1228  AST 17  ALT 14  ALKPHOS 73  BILITOT 1.0  PROT 7.4  ALBUMIN 3.2*   No results for input(s): LIPASE, AMYLASE in the last 168 hours. No results for input(s): AMMONIA in the last 168 hours.  CBC: Recent Labs  Lab 02/21/2020 1228  WBC 6.4  NEUTROABS 5.0  HGB 6.1*  HCT 19.4*  MCV 96.0  PLT 124*     Cardiac Enzymes: Recent Labs  Lab 03/01/2020 1228  CKMB 33.0*    BNP: Invalid input(s): POCBNP  CBG: Recent Labs  Lab 02/10/2020 0916  GLUCAP 156*    Microbiology: Results for orders placed or performed during the hospital encounter of 02/12/20  SARS CORONAVIRUS 2 (TAT 6-24 HRS) Nasopharyngeal Nasopharyngeal Swab     Status: None   Collection Time: 02/12/20 11:16 AM   Specimen: Nasopharyngeal Swab  Result Value Ref Range Status   SARS Coronavirus 2 NEGATIVE NEGATIVE Final    Comment: (NOTE) SARS-CoV-2 target nucleic acids are NOT DETECTED.  The SARS-CoV-2 RNA is generally detectable in upper and lower respiratory specimens during the acute phase of infection. Negative results do not preclude SARS-CoV-2 infection, do not rule out co-infections with other pathogens, and should not be used as the sole basis for treatment or other patient management decisions. Negative results must be combined with clinical observations, patient history, and epidemiological information. The expected result is Negative.  Fact Sheet for Patients: SugarRoll.be  Fact Sheet for Healthcare Providers: https://www.woods-mathews.com/  This test is not yet approved or cleared by the [place] FDA and  has been authorized for detection and/or diagnosis of SARS-CoV-2 by FDA under an Emergency Use Authorization (EUA). This EUA will remain  in effect (meaning this test can be used) for the duration of the COVID-19 declaration under Se ction 564(b)(1) of the Act, 21 U.S.C. section 360bbb-3(b)(1),  unless the authorization is terminated or revoked sooner.  Performed at [facility], [place] [address] [place], [place] [address]     Coagulation Studies: Recent Labs    02/27/2020 1228  LABPROT 15.2  INR 1.3*    Urinalysis: No results for input(s): COLORURINE, LABSPEC, PHURINE, GLUCOSEU, HGBUR, BILIRUBINUR, KETONESUR, PROTEINUR, UROBILINOGEN,  NITRITE, LEUKOCYTESUR in the last 72 hours.  Invalid input(s): APPERANCEUR    Imaging: CT HEAD WO CONTRAST  Result Date: 02/06/2020 CLINICAL DATA:  Status post cardiac arrest EXAM: CT HEAD WITHOUT CONTRAST TECHNIQUE: Contiguous axial images were obtained from the base of the skull through the vertex without intravenous contrast. COMPARISON:  March 13, 2012 head CT and brain MRI. FINDINGS: Brain: Ventricles and sulci are normal in size and configuration. There is no intracranial mass, hemorrhage, extra-axial fluid collection, or midline shift. There is preservation of gray-white differentiation without evident cerebral edema. Brain parenchyma appears unremarkable. No acute infarct evident. There is a mild area of deep sulcal asymmetry in the right temporal region compared to the left, stable and a presumed anatomic variant. Vascular: No hyperdense vessel. There is calcification in each carotid siphon region. Skull: The bony calvarium appears intact. Sinuses/Orbits: Visualized paranasal sinuses are clear. Orbits appear symmetric bilaterally. Other: Mastoid air cells are clear. IMPRESSION: Preservation of gray-white differentiation without edema. No acute infarct. No mass or hemorrhage. Foci of arterial vascular calcification noted. Electronically Signed   By: Lowella Grip III M.D.   On: 02/29/2020 13:03     Medications:   . sodium chloride    . sodium chloride 10 mL/hr at 02/26/2020 1313  . famotidine (PEPCID) IV    . fentaNYL infusion INTRAVENOUS 75 mcg/hr (02/01/2020 1443)  . norepinephrine (LEVOPHED) Adult infusion 2 mcg/min (03/01/2020 1315)  . propofol (DIPRIVAN) infusion 30 mcg/kg/min (02/03/2020 1444)   . sodium chloride   Intravenous Once  . aspirin  300 mg Rectal NOW  . docusate  100 mg Oral BID  . EPINEPHrine      . EPINEPHrine      . fentaNYL (SUBLIMAZE) injection  100 mcg Intravenous Once  . heparin  5,000 Units Subcutaneous Q8H  . polyethylene glycol  17 g Oral Daily    docusate sodium, fentaNYL, midazolam, ondansetron (ZOFRAN) IV, polyethylene glycol  Assessment/ Plan:  Patrick Hurst is a 64 y.o.  male came in for a scheduled colonoscopy today, indicated for colon cancer screening, went in to cardiorespiratory arrest during the procedure. Cardiopulmonary resuscitation was initiated in endoscopy unit.He got transferred to ICU,intubated and sedated with Fentanyl and Propofol.  # ESRD on Peritoneal dialysis Patient is on peritoneal dialysis at home,did require back up hemodialysis 02/13/20 due to volume overload Plan on peritoneal dialysis today Will place orders   #Hyperkalemia -Potassium 5.9  -Plan to dialyze him today  #Cardiac Arrest,  Acute Respiratory Distress Syndrome  -Patient is intubated and on ventilator PRVC with FIO2 45 % -Sedated with Fentanyl and Propofol -Required Norepinephrine -Management per ICU team  #Anemia of CKD  -Hgb 6.1 -Plan for PRBC transfusion today -Epogen 40,000 units subcutaneous weekly,last dose 02/11/20 -Will continue Epogen while hospitalized  #Secondary Hyperparathyroidism -Phosphorus 15.8 -Calcium 7.3 -Albumin 3.2 -Patient is on Renvela as outpatient      LOS: 0 Patrick Hurst 10/14/20213:00 PM

## 2020-02-14 NOTE — Procedures (Signed)
Arterial Line Placement: Indication: Frequent blood draws; Invasive BP monitoring.   Consent: VERBAL WRITTEN  Hand washing performed prior to starting the procedure.   Procedure: An active timeout was performed and correct patient, name, & ID confirmed. Physicial exam was performed to ensure adequate perfusion.  Using sterile technique, an aterial line was inserted into the RT Femoral artery.  Catheter threaded and the needle was removed with appropriate blood return.  Arterial waveform was noted.  After the procedure, the patient's extremities were observed to be pink and warm.   Estimated Blood Loss: None .   Number of Attempts: 1.   Complications: None .  Operator: Teryl Mcconaghy/Chuster   Corrin Parker, M.D.  [facility]  Medical Director [place] Director [facility] Cardio-Pulmonary Department

## 2020-02-14 NOTE — Progress Notes (Signed)
eeg done °

## 2020-02-14 NOTE — Progress Notes (Signed)
Ch arrived at Endoscopy room in response to [place]. After pulses were gotten back, and Pt was to be moved to ICU, Ch was present with Pt's wife Neoma Laming when MD updated her of what had happened. Ch escorted her to the ICU waiting area while Pt was being stabilized in ICU room. Neoma Laming was anxious and tearful. She let Ch know that she needs to call her sons. Her youngest son is in [place]. Ch provided pastoral presence and support, and let her know that she will be back to check in on her.  12:45 p.m. Ch returned to check on Neoma Laming in the ICU waiting area, and she said that Pt had had a stroke, and care team was going to check Pt was brain function. She also said that she was told that Pt will possibly not get out of coma, and he had some ribs broken. Ch stayed to support her. Ch prayed with her, and she was extremely tearful. Ch was present until older son came and joined her. She let Ch know that her younger son will reach [place] at 10 p.m. with permission from [employer]. She wants her sons beside her during this ordeal. Older son tearful and helping manage situation at home. Ch gave some privacy to them. Ch will follow up later.  06:25 p.m.  Ch met Neoma Laming again at the waiting area. She said that she waited too long in the waiting area without being called in after scans. She said she  was in the waiting area while foley catheter was being placed. Ch stayed and provided support. She is waiting for son to reach, to come to visit Pt in the morning. Porter let her know about anytime chaplain availability for support upon request.

## 2020-02-14 NOTE — Progress Notes (Signed)
Pt was transferred from Endoscopy unit to CCU via stretcher at 11:38 am. Pt coded during procedure. Report given to shift nurse.

## 2020-02-14 NOTE — Anesthesia Procedure Notes (Signed)
Performed by: Cook-Martin, Kimba Lottes Pre-anesthesia Checklist: Patient identified, Emergency Drugs available, Suction available, Patient being monitored and Timeout performed Patient Re-evaluated:Patient Re-evaluated prior to induction Oxygen Delivery Method: Supernova nasal CPAP Preoxygenation: Pre-oxygenation with 100% oxygen Induction Type: IV induction Placement Confirmation: positive ETCO2 and CO2 detector       

## 2020-02-14 NOTE — Anesthesia Preprocedure Evaluation (Addendum)
Anesthesia Evaluation  Patient identified by MRN, date of birth, ID band Patient awake    Reviewed: Allergy & Precautions, H&P , NPO status , reviewed documented beta blocker date and time   History of Anesthesia Complications Negative for: history of anesthetic complications  Airway Mallampati: III  TM Distance: >3 FB Neck ROM: limited   Comment: Large beard Dental  (+) Poor Dentition, Chipped, Missing, Dental Advidsory Given Very poor dentition, none loose:   Pulmonary shortness of breath, with exertion and Long-Term Oxygen Therapy, sleep apnea, Continuous Positive Airway Pressure Ventilation and Oxygen sleep apnea , pneumonia, resolved, COPD,  oxygen dependent, neg recent URI, former smoker,     + decreased breath sounds      Cardiovascular hypertension, (-) angina+CHF  (-) Past MI and (-) Cardiac Stents + dysrhythmias Atrial Fibrillation (-) Valvular Problems/Murmurs  03/2019 ECHO IMPRESSIONS    1. Left ventricular ejection fraction, by visual estimation, is 55 to  60%. The left ventricle has normal function. There is mildly increased  left ventricular hypertrophy.  2. Global right ventricle has normal systolic function.The right  ventricular size is normal. No increase in right ventricular wall  thickness.  3. Left atrial size was normal.  4. Right atrial size was normal.  5. The mitral valve is normal in structure. Mild mitral valve  regurgitation.  6. The tricuspid valve is normal in structure. Tricuspid valve  regurgitation is trivial.  7. The aortic valve is normal in structure. Aortic valve regurgitation is  not visualized.  8. The pulmonic valve was grossly normal. Pulmonic valve regurgitation is  not visualized.    Neuro/Psych neg Seizures PSYCHIATRIC DISORDERS Depression  Neuromuscular disease    GI/Hepatic PUD, GERD  Controlled,(+) Cirrhosis       , NAFLD   Endo/Other  diabetes, Type  2Hypothyroidism Morbid obesity  Renal/GU CRF and DialysisRenal diseaseDialyzed yesterday     Musculoskeletal  (+) Arthritis ,   Abdominal (+) + obese,   Peds  Hematology  (+) Blood dyscrasia, anemia ,   Anesthesia Other Findings Past Medical History: 06/21/2018: Acute on chronic diastolic CHF (congestive heart failure)  ([place]) 02/12/2015: Bell palsy 02/12/2015: Carpal tunnel syndrome No date: Dependence on nocturnal oxygen therapy     Comment:  2 LITERS WITH BIPAP No date: Depression No date: Gastric ulcer No date: GERD (gastroesophageal reflux disease) No date: Gout No date: History of chicken pox 03/04/2019: Multifocal pneumonia No date: Sleep apnea treated with nocturnal BiPAP  Past Surgical History: 2014 and 2015: CATARACT EXTRACTION; Bilateral 04/28/2011: CHOLECYSTECTOMY     Comment:  Laproscopic; Dr. Pat Patrick 05/24/2019: DIALYSIS/PERMA CATHETER INSERTION; N/A     Comment:  Procedure: DIALYSIS/PERMA CATHETER INSERTION;  Surgeon:               Algernon Huxley, MD;  Location: [facility];                Service: Cardiovascular;  Laterality: N/A; 10/09/2019: DIALYSIS/PERMA CATHETER INSERTION; N/A     Comment:  Procedure: DIALYSIS/PERMA CATHETER INSERTION;  Surgeon:               Katha Cabal, MD;  Location: [facility];               Service: Cardiovascular;  Laterality: N/A; No date: EYE SURGERY No date: GALLBLADDER SURGERY 2019: LASIK; Bilateral     Comment:  medical 2012: SHOULDER SURGERY; Right     Comment:  Dr. Leanor Kail  Reproductive/Obstetrics                             Anesthesia Physical  Anesthesia Plan  ASA: IV  Anesthesia Plan: General   Post-op Pain Management:    Induction: Intravenous  PONV Risk Score and Plan: 2 and Treatment may vary due to age or medical condition, TIVA, Ondansetron and Propofol infusion  Airway Management Planned: Nasal CPAP  Additional Equipment: None  Intra-op  Plan:   Post-operative Plan:   Informed Consent: I have reviewed the patients History and Physical, chart, labs and discussed the procedure including the risks, benefits and alternatives for the proposed anesthesia with the patient or authorized representative who has indicated his/her understanding and acceptance.     Dental Advisory Given  Plan Discussed with: CRNA  Anesthesia Plan Comments: (Patient with multiple comorbidities which could affect anesthetic.  Will use supernova secondary to patient's sleep apnea.  Discussed risks of anesthesia with patient, including possibility of difficulty with spontaneous ventilation under anesthesia necessitating airway intervention. )      Anesthesia Quick Evaluation

## 2020-02-14 NOTE — Progress Notes (Signed)
Patient admitted from Endoscopy Department at 1155 via stretcher. Patient was reported to have coded after endoscopy procedure started. CRNA reported patient respiratory arrested then went into PEA. Patient was coded x 10 minutes with the return o ROSC. Received on stretcher. SEE admission assessment and code sheet for details. Patient skin colored and gray upon admission. Noted to have AV fistula in left lower arm, old peritoneal catheter in left abdomin and temporary hemodialysis catheter in right chest. Pupils were unequal and sluggish to react. Taken to CT for head scan.

## 2020-02-14 NOTE — Progress Notes (Signed)
Pt was transported to CT from CCU and back while on the vent. 

## 2020-02-14 NOTE — H&P (Signed)
NAME:  Patrick Hurst, MRN:  [id number], DOB:  11-Dec-1955, LOS: 0 ADMISSION DATE:  02/01/2020, CONSULTATION DATE:  02/15/2020 REFERRING MD:  Dr. Kenton Kingfisher, CHIEF COMPLAINT:  Cardiac Arrest   Brief History   64 yo male presented for outpatient colonoscopy PEA cardiac arrested during the procedure, s/p 10 minutes CPR, intubated requiring mechanical ventilation with Targeted Temperature Management admitted to the ICU.  History of present illness   64 yo male with a significant history of COPD, OSA, T2DM, ESRD, CHF, AF presented to Endoscopy for an outpatient colonoscopy procedure.  In the middle of the procedure his heart rate began to drop and he PEA arrested requiring 10 minutes of CPR prior to achieving ROSC.  He received epinephrine, atropine, calcium, sodium bicarb & D50.  He was intubated during the code blue requiring mechanical ventilation.   He had a recent admit in August 2021 for acute hypoxic respiratory failure and symptomatic anemia supported on BIPAP and completed a work up with heme/onc and GI.  This procedure was to complete this anemia work up that had been initiated during that hospitalization. The patient was unresponsive post arrest and the decision was made to activate TTM.  PCCM was consulted for further management and the patient was transferred to the ICU.  Past Medical History  OSA with Bipap HFpEF Bells Palsy T2DM Proliferative diabetic retinopathy ESRD on peritoneal dialysis with HD also AFib on eliquis NASH cirrhosis   Significant Hospital Events   10/14 Code Blue in Endo procedure, intubated, admitted to ICU for TTM  Consults:  Nephrology  Procedures:  10/14 ETT >> 10/14 Arterial line >> 10/14 CVC 3L>> Significant Diagnostic Tests:  02/27/2020 CT head >> preservation of gray-white matter differentiation without edema. No acute infarct, no mass of hemorrhage.  Micro Data:  10/14 MRSA PCR >> 10/14 BC >> 10/14 Tracheal aspirate >>  Antimicrobials:     Interim history/subjective:  Patient critically ill, intubated & sedated requiring mechanical ventilation.   Wife and son updated bedside.  Labs/ Imaging personally reviewed Na+/ K+: 140/ 5.9 BUN/Cr.: 91/ 19.17 Hgb: 6.1 Troponin: 169 ~ 175 WBC: 6.4  ABG: 7.35/ 42/ 87/ 23.2 (s/p intubation)  Objective   Blood pressure (!) 121/46, pulse 88, temperature 97.8 F (36.6 C), temperature source Oral, resp. rate 16, height 6' (1.829 m), weight 131.2 kg, SpO2 94 %.    Vent Mode: PRVC FiO2 (%):  [45 %] 45 % Set Rate:  [24 bmp] 24 bmp Vt Set:  [550 mL] 550 mL PEEP:  [5 cmH20] 5 cmH20 Plateau Pressure:  [14 cmH20] 14 cmH20   Intake/Output Summary (Last 24 hours) at 02/25/2020 1208 Last data filed at 02/15/2020 1101 Gross per 24 hour  Intake --  Output 0 ml  Net 0 ml   Filed Weights   02/28/2020 0916 02/25/2020 1200  Weight: 130.2 kg 131.2 kg    Examination: General: Adult male, critically ill, lying in bed intubated & sedated requiring mechanical ventilation HEENT: atraumatic, neck supple, MM pink/moist, anicteric Neuro: unresponsive, unable to follow commands, Pupils unequal R #5/ L #2 both sluggish, myoclonus jerking activity CV: s1s2 RRR, NSR on monitor, no r/m/g Pulm: Regular, non labored on PRVC at 45% , breath sounds diminished breath sounds throughout GI: soft, rounded with PD cath in place, bs x 4 Skin: intact, no rashes/lesions noted Extremities: warm/dry, pulses + 2 R/P, no edema noted  Resolved Hospital Problem list     Assessment & Plan:  Cardiac arrest Patient in the middle of  an outpatient elective colonoscopy for colon cancer screening. Per endo team, patient became bradycardic and PEA arrested 10 minutes of CPR, concern for possible anoxic injury  Circulatory shock 10 minutes downtime, CPR initiated immediately - 36 degree TTM initiated per protocol - CT head obtained - CVC & arterial line placed - Continue levophed PRN, goal MAP > 65 - Echo ordered -  Trend troponin, lactate  Acute hypoxic respiratory failure  PMHx: COPD, OSA on bipap In the setting of PEA cardiac arrest, patient intubated during CODE BLUE event - Ventilator settings: PRVC at 45%, PEEP 5, 49mL/kg  - Wean PEEP and FiO2 for sats greater than 90%. - Follow intermittent chest x-ray and ABG.   - daily WUA & SBT once TTM completed - VAP bundle in place  - PAD protocol: propofol & fentanyl  At risk for anoxic encephalopathy. 10 minutes of CPR prior to achieving ROSC - Sedation  Propofol & fentanyl, will utilize NMB PRN for shivering - Hold daily WUA & SBT until TTM completed - Neuro consult once rewarmed as needed  Hyperkalemia - K+ 5.9 PMHx: ESRD Patient on PD and HD PRN outpatient - Unable to give veltassa via OGT, lokelma ordered QD - Strict I/O's: alert provider if UOP < 0.5 mL/kg/hr - Q 12 BMP, replace electrolytes PRN - Avoid nephrotoxic agents as able, ensure adequate renal perfusion - Nephrology following, appreciate input  Anemia Hbg: 6.1 - 2 units PRBC's ordered for transfusion, recheck H&H per protocol - Monitor for s/s of bleeding - Daily CBC - Transfuse for Hgb <7  Atrial Fibrillation Patient currently in NSR on monitor - home medications: amiodarone & eliquis on hold - consider restarting amiodarone 10/15 via OGT  At risk for multiple metabolic derangements during cooling. - NS @ 10 mL (ESRD patient) - Correct electrolytes as indicated. - BMP q12hrs per TTM protocol     Best practice:  Diet: NPO Pain/Anxiety/Delirium protocol (if indicated): propofol & fentanyl VAP protocol (if indicated): initiated DVT prophylaxis: heparin SQ GI prophylaxis: pepcid Glucose control: Q 4 CBG, target range 140-180 Mobility: bedrest Code Status: FULL Family Communication: Updated wife & son bedside Disposition: ICU  Labs   CBC: No results for input(s): WBC, NEUTROABS, HGB, HCT, MCV, PLT in the last 168 hours.  Basic Metabolic Panel: No results for  input(s): NA, K, CL, CO2, GLUCOSE, BUN, CREATININE, CALCIUM, MG, PHOS in the last 168 hours. GFR: CrCl cannot be calculated (Patient's most recent lab result is older than the maximum 21 days allowed.). No results for input(s): PROCALCITON, WBC, LATICACIDVEN in the last 168 hours.  Liver Function Tests: No results for input(s): AST, ALT, ALKPHOS, BILITOT, PROT, ALBUMIN in the last 168 hours. No results for input(s): LIPASE, AMYLASE in the last 168 hours. No results for input(s): AMMONIA in the last 168 hours.  ABG    Component Value Date/Time   PHART 7.38 03/03/2019 1643   PCO2ART 37 03/03/2019 1643   PO2ART 66 (L) 03/03/2019 1643   HCO3 22.0 12/27/2019 0054   TCO2 22 11/08/2019 1118   ACIDBASEDEF 8.4 (H) 12/27/2019 0054   O2SAT 73.1 12/27/2019 0054     Coagulation Profile: No results for input(s): INR, PROTIME in the last 168 hours.  Cardiac Enzymes: No results for input(s): CKTOTAL, CKMB, CKMBINDEX, TROPONINI in the last 168 hours.  HbA1C: Hemoglobin A1C  Date/Time Value Ref Range Status  11/12/2019 08:43 AM 5.8 (A) 4.0 - 5.6 % Final  07/11/2019 08:22 AM 5.6 4.0 - 5.6 % Final  03/14/2012 03:21 AM 5.0 4.2 - 6.3 % Final    Comment:    The American Diabetes Association recommends that a primary goal of therapy should be <7% and that physicians should reevaluate the treatment regimen in patients with HbA1c values consistently >8%.    Hgb A1c MFr Bld  Date/Time Value Ref Range Status  03/02/2019 12:17 AM 6.1 (H) 4.8 - 5.6 % Final    Comment:    (NOTE) Pre diabetes:          5.7%-6.4% Diabetes:              >6.4% Glycemic control for   <7.0% adults with diabetes   11/18/2017 10:12 AM 5.1 4.8 - 5.6 % Final    Comment:             Prediabetes: 5.7 - 6.4          Diabetes: >6.4          Glycemic control for adults with diabetes: <7.0     CBG: Recent Labs  Lab 02/19/2020 0916  GLUCAP 156*    Review of Systems:   UTA, patient s/p cardiac arrest, unresponsive-  intubated requiring mechanical ventilation.  Past Medical History  He,  has a past medical history of Acute on chronic diastolic CHF (congestive heart failure) ([place]) (06/21/2018), Acute respiratory failure with hypoxia ([place]) (06/21/2018), Bell palsy (02/12/2015), Carpal tunnel syndrome (02/12/2015), Dependence on nocturnal oxygen therapy, Depression, Gastric ulcer, GERD (gastroesophageal reflux disease), Gout, History of chicken pox, Multifocal pneumonia (03/04/2019), and Sleep apnea treated with nocturnal BiPAP.   Surgical History    Past Surgical History:  Procedure Laterality Date  . AV FISTULA PLACEMENT Left 11/08/2019   Procedure: ARTERIOVENOUS (AV) FISTULA CREATION (RADIALCEPHALIC);  Surgeon: Algernon Huxley, MD;  Location: [facility];  Service: Vascular;  Laterality: Left;  . CATARACT EXTRACTION Bilateral 2014 and 2015  . CHOLECYSTECTOMY  04/28/2011   Laproscopic; Dr. Pat Patrick  . COLONOSCOPY WITH PROPOFOL N/A 12/30/2019   Procedure: COLONOSCOPY WITH PROPOFOL;  Surgeon: Lin Landsman, MD;  Location: [facility] ENDOSCOPY;  Service: Gastroenterology;  Laterality: N/A;  . DIALYSIS/PERMA CATHETER INSERTION N/A 05/24/2019   Procedure: DIALYSIS/PERMA CATHETER INSERTION;  Surgeon: Algernon Huxley, MD;  Location: [facility];  Service: Cardiovascular;  Laterality: N/A;  . DIALYSIS/PERMA CATHETER INSERTION N/A 10/09/2019   Procedure: DIALYSIS/PERMA CATHETER INSERTION;  Surgeon: Katha Cabal, MD;  Location: [place] [facility];  Service: Cardiovascular;  Laterality: N/A;  . ESOPHAGOGASTRODUODENOSCOPY (EGD) WITH PROPOFOL N/A 12/29/2019   Procedure: ESOPHAGOGASTRODUODENOSCOPY (EGD) WITH PROPOFOL;  Surgeon: Lin Landsman, MD;  [facility] END[facility];  Service: Gastroenterology;  Laterality: N/A;  . EYE SURGERY    . GALLBLADDER SURGERY    . LASIK Bilateral 2019   medical  . SHOULDER SURGERY Right 2012   Dr. Leanor Kail     Social History   reports that he quit smoking about 41 years ago.  His smoking use included cigarettes. He has a 15.00 pack-year smoking history. He has never used smokeless tobacco. He reports that he does not drink alcohol and does not use drugs.   Family History   His family history includes COPD in his mother; Cancer in his mother; Heart disease in his father.   Allergies Allergies  Allergen Reactions  . Mucinex [Guaifenesin Er] Swelling    Throat swelling, increases heart rate  . Levaquin [Levofloxacin] Palpitations     Home Medications  Prior to Admission medications   Medication Sig Start Date End  Date Taking? Authorizing Provider  amiodarone (PACERONE) 200 MG tablet Take 200 mg by mouth daily. 08/16/19  Yes [provider]  amitriptyline (ELAVIL) 25 MG tablet TAKE 1 TABLET AT BEDTIME Patient taking differently: Take 25 mg by mouth at bedtime.  07/26/19  Yes [name], MD  atorvastatin (LIPITOR) 40 MG tablet Take 1 tablet (40 mg total) by mouth daily. 01/13/20  Yes [name], MD  Budeson-Glycopyrrol-Formoterol (BREZTRI AEROSPHERE) 160-9-4.8 MCG/ACT AERO Inhale 2 puffs into the lungs in the morning and at bedtime.    Yes [provider]  calcitRIOL (ROCALTROL) 0.5 MCG capsule Take 0.5 mcg by mouth daily. 12/29/18  Yes [provider]  FLOMAX 0.4 MG CAPS capsule Take 0.4 mg by mouth daily. 01/02/20  Yes [provider]  insulin regular human CONCENTRATED (HUMULIN R) 500 UNIT/ML injection Inject up to 60 units twice a day as directed by physician 02/11/20  Yes [name], MD  Ipratropium-Albuterol (COMBIVENT RESPIMAT) 20-100 MCG/ACT AERS respimat Inhale 2 puffs into the lungs every 4 (four) hours as needed for wheezing.    Yes [provider]  lactulose (CHRONULAC) 10 GM/15ML solution Take 30 mLs by mouth daily. 09/10/19  Yes [provider]  levothyroxine (SYNTHROID) 100 MCG tablet TAKE 1 TABLET DAILY Patient taking differently: Take 100 mcg by mouth daily before breakfast.  08/01/19   Yes Fisher, Kirstie Peri, MD  metoprolol tartrate (LOPRESSOR) 50 MG tablet Take 0.5 tablets (25 mg total) by mouth 2 (two) times daily. 01/01/20  Yes Shawna Clamp, MD  pantoprazole (PROTONIX) 40 MG tablet TAKE 1 TABLET TWICE A DAY 02/05/20  Yes [name], MD  sevelamer carbonate (RENVELA) 800 MG tablet Take by mouth. 12/27/19  Yes [provider]  torsemide (DEMADEX) 10 MG tablet Take 40 mg by mouth 2 (two) times daily.  08/13/19  Yes [provider]  ULORIC 80 MG TABS Take 80 mg by mouth daily.  05/18/17  Yes [provider]  Ascorbic Acid (VITAMIN C) 1000 MG tablet Take 1,000 mg by mouth 2 (two) times daily.     [provider]  BD INSULIN SYRINGE U/F 31G X 5/16" 1 ML MISC  11/18/19   [provider]  cetirizine (ZYRTEC) 10 MG tablet Take 10 mg by mouth daily as needed for allergies.     [provider]  ELIQUIS 2.5 MG TABS tablet Take 2.5 mg by mouth 2 (two) times daily.  03/23/19   [provider]  gentamicin cream (GARAMYCIN) 0.1 % Apply 1 application topically daily. 09/22/19   [provider]  Spacer/Aero-Holding Chambers (AEROCHAMBER PLUS WITH MASK) inhaler See admin instructions. 12/05/19   [provider]  Spacer/Aero-Holding Josiah Lobo (EASIVENT) inhaler See admin instructions. 12/05/19 12/04/20  [provider]     Critical care time: 55 minutes    Domingo Pulse Rust[place], AGACNP-BC [facility]    Please see Amion for pager details.

## 2020-02-14 NOTE — H&P (Signed)
[name], MD [address]  [place]  [place], [place] [address]  Main: [phone]  Fax: [phone] Pager: [phone]  Primary Care Physician:  [name], MD Primary Gastroenterologist:  Dr. [name]  Pre-Procedure History & Physical: HPI:  Patrick Hurst is a 64 y.o. male is here for an colonoscopy.   Past Medical History:  Diagnosis Date  . Acute on chronic diastolic CHF (congestive heart failure) ([facility]) 06/21/2018  . Acute respiratory failure with hypoxia ([facility]) 06/21/2018  . Bell palsy 02/12/2015  . Carpal tunnel syndrome 02/12/2015  . Dependence on nocturnal oxygen therapy    2 LITERS WITH BIPAP  . Depression   . Gastric ulcer   . GERD (gastroesophageal reflux disease)   . Gout   . History of chicken pox   . Multifocal pneumonia 03/04/2019  . Sleep apnea treated with nocturnal BiPAP    uses bipap    Past Surgical History:  Procedure Laterality Date  . AV FISTULA PLACEMENT Left 11/08/2019   Procedure: ARTERIOVENOUS (AV) FISTULA CREATION (RADIALCEPHALIC);  Surgeon: Algernon Huxley, MD;  Location: [facility] ORS;  Service: Vascular;  Laterality: Left;  . CATARACT EXTRACTION Bilateral 2014 and 2015  . CHOLECYSTECTOMY  04/28/2011   Laproscopic; Dr. Pat Patrick  . COLONOSCOPY WITH PROPOFOL N/A 12/30/2019   Procedure: COLONOSCOPY WITH PROPOFOL;  Surgeon: Lin Landsman, MD;  Location: [facility] ENDOSCOPY;  Service: Gastroenterology;  Laterality: N/A;  . DIALYSIS/PERMA CATHETER INSERTION N/A 05/24/2019   Procedure: DIALYSIS/PERMA CATHETER INSERTION;  Surgeon: Algernon Huxley, MD;  Location: [facility];  Service: Cardiovascular;  Laterality: N/A;  . DIALYSIS/PERMA CATHETER INSERTION N/A 10/09/2019   Procedure: DIALYSIS/PERMA CATHETER INSERTION;  Surgeon: Katha Cabal, MD;  Location: [place] [facility];  Service: Cardiovascular;  Laterality: N/A;  . ESOPHAGOGASTRODUODENOSCOPY (EGD) WITH PROPOFOL N/A 12/29/2019   Procedure: ESOPHAGOGASTRODUODENOSCOPY (EGD)  WITH PROPOFOL;  Surgeon: Lin Landsman, MD;  Location: [facility] ENDOSCOPY;  Service: Gastroenterology;  Laterality: N/A;  . EYE SURGERY    . GALLBLADDER SURGERY    . LASIK Bilateral 2019   medical  . SHOULDER SURGERY Right 2012   Dr. Leanor Kail    Prior to Admission medications   Medication Sig Start Date End Date Taking? Authorizing Provider  amiodarone (PACERONE) 200 MG tablet Take 200 mg by mouth daily. 08/16/19  Yes [provider]  amitriptyline (ELAVIL) 25 MG tablet TAKE 1 TABLET AT BEDTIME Patient taking differently: Take 25 mg by mouth at bedtime.  07/26/19  Yes [name], MD  atorvastatin (LIPITOR) 40 MG tablet Take 1 tablet (40 mg total) by mouth daily. 01/13/20  Yes [name], MD  Budeson-Glycopyrrol-Formoterol (BREZTRI AEROSPHERE) 160-9-4.8 MCG/ACT AERO Inhale 2 puffs into the lungs in the morning and at bedtime.    Yes [provider]  calcitRIOL (ROCALTROL) 0.5 MCG capsule Take 0.5 mcg by mouth daily. 12/29/18  Yes [provider]  FLOMAX 0.4 MG CAPS capsule Take 0.4 mg by mouth daily. 01/02/20  Yes [provider]  insulin regular human CONCENTRATED (HUMULIN R) 500 UNIT/ML injection Inject up to 60 units twice a day as directed by physician 02/11/20  Yes [name], MD  Ipratropium-Albuterol (COMBIVENT RESPIMAT) 20-100 MCG/ACT AERS respimat Inhale 2 puffs into the lungs every 4 (four) hours as needed for wheezing.    Yes [provider]  lactulose (CHRONULAC) 10 GM/15ML solution Take 30 mLs by mouth daily. 09/10/19  Yes [provider]  levothyroxine (SYNTHROID) 100  MCG tablet TAKE 1 TABLET DAILY Patient taking differently: Take 100 mcg by mouth daily before breakfast.  08/01/19  Yes Fisher, Kirstie Peri, MD  metoprolol tartrate (LOPRESSOR) 50 MG tablet Take 0.5 tablets (25 mg total) by mouth 2 (two) times daily. 01/01/20  Yes Shawna Clamp, MD  pantoprazole (PROTONIX) 40 MG tablet TAKE 1 TABLET TWICE A DAY  02/05/20  Yes [name], MD  sevelamer carbonate (RENVELA) 800 MG tablet Take by mouth. 12/27/19  Yes [provider]  torsemide (DEMADEX) 10 MG tablet Take 40 mg by mouth 2 (two) times daily.  08/13/19  Yes [provider]  ULORIC 80 MG TABS Take 80 mg by mouth daily.  05/18/17  Yes [provider]  Ascorbic Acid (VITAMIN C) 1000 MG tablet Take 1,000 mg by mouth 2 (two) times daily.     [provider]  BD INSULIN SYRINGE U/F 31G X 5/16" 1 ML MISC  11/18/19   [provider]  cetirizine (ZYRTEC) 10 MG tablet Take 10 mg by mouth daily as needed for allergies.     [provider]  ELIQUIS 2.5 MG TABS tablet Take 2.5 mg by mouth 2 (two) times daily.  03/23/19   [provider]  gentamicin cream (GARAMYCIN) 0.1 % Apply 1 application topically daily. 09/22/19   [provider]  Spacer/Aero-Holding Chambers (AEROCHAMBER PLUS WITH MASK) inhaler See admin instructions. 12/05/19   [provider]  Spacer/Aero-Holding Josiah Lobo (EASIVENT) inhaler See admin instructions. 12/05/19 12/04/20  [provider]    Allergies as of 01/31/2020 - Review Complete 01/30/2020  Allergen Reaction Noted  . Mucinex [guaifenesin er] Swelling 02/12/2015  . Levaquin [levofloxacin] Palpitations 05/14/2015    Family History  Problem Relation Age of Onset  . COPD Mother   . Cancer Mother   . Heart disease Father     Social History   Socioeconomic History  . Marital status: Married    Spouse name: Hilda Blades  . Number of children: Not on file  . Years of education: Not on file  . Highest education level: Not on file  Occupational History  . Occupation: Works at [employer]    Comment: Full time  Tobacco Use  . Smoking status: Former Smoker    Packs/day: 1.00    Years: 15.00    Pack years: 15.00    Types: Cigarettes    Quit date: 05/03/1978    Years since quitting: 41.8  . Smokeless tobacco: Never Used  . Tobacco comment:  started smoking at age 76  Vaping Use  . Vaping Use: Never used  Substance and Sexual Activity  . Alcohol use: No    Alcohol/week: 0.0 standard drinks  . Drug use: No  . Sexual activity: Not Currently  Other Topics Concern  . Not on file  Social History Narrative  . Not on file   Social Determinants of Health   Financial Resource Strain:   . Difficulty of Paying Living Expenses: Not on file  Food Insecurity:   . Worried About Charity fundraiser in the Last Year: Not on file  . Ran Out of Food in the Last Year: Not on file  Transportation Needs:   . Lack of Transportation (Medical): Not on file  . Lack of Transportation (Non-Medical): Not on file  Physical Activity:   . Days of Exercise per Week: Not on file  . Minutes of Exercise per Session: Not on file  Stress:   . Feeling of Stress :  Not on file  Social Connections:   . Frequency of Communication with Friends and Family: Not on file  . Frequency of Social Gatherings with Friends and Family: Not on file  . Attends Religious Services: Not on file  . Active Member of Clubs or Organizations: Not on file  . Attends Archivist Meetings: Not on file  . Marital Status: Not on file  Intimate Partner Violence:   . Fear of Current or Ex-Partner: Not on file  . Emotionally Abused: Not on file  . Physically Abused: Not on file  . Sexually Abused: Not on file    Review of Systems: See HPI, otherwise negative ROS  Physical Exam: BP 110/73   Pulse 82   Temp 97.9 F (36.6 C) (Temporal)   Resp 18   Ht 5\' 11"  (1.803 m)   Wt 130.2 kg   SpO2 96%   BMI 40.03 kg/m  General:   Alert,  pleasant and cooperative in NAD Head:  Normocephalic and atraumatic. Neck:  Supple; no masses or thyromegaly. Lungs:  Clear throughout to auscultation.    Heart:  Regular rate and rhythm. Abdomen:  Soft, nontender and nondistended. Normal bowel sounds, without guarding, and without rebound.   Neurologic:  Alert and  oriented x4;   grossly normal neurologically.  Impression/Plan: Patrick Hurst is here for an colonoscopy to be performed for colon cancer screening  Risks, benefits, limitations, and alternatives regarding  colonoscopy have been reviewed with the patient.  Questions have been answered.  All parties agreeable.   Sherri Sear, MD  02/18/2020, 10:07 AM

## 2020-02-14 NOTE — Transfer of Care (Signed)
Immediate Anesthesia Transfer of Care Note  Patient: Patrick Hurst  Procedure(s) Performed: COLONOSCOPY WITH PROPOFOL (N/A )  Patient Location: ICU  Anesthesia Type:General  Level of Consciousness: unresponsive  Airway & Oxygen Therapy: Patient remains intubated per anesthesia plan  Post-op Assessment: Report given to RN and Post -op Vital signs reviewed and stable  Post vital signs: Reviewed and stable  Last Vitals:  Vitals Value Taken Time  BP 121/46 02/06/2020 1200  Temp 36.6 C 02/13/2020 1200  Pulse 85 02/04/2020 1208  Resp 24 02/27/2020 1208  SpO2 93 % 02/11/2020 1208  Vitals shown include unvalidated device data.  Last Pain:  Vitals:   02/16/2020 1200  TempSrc: Oral  PainSc:          Complications: No complications documented.

## 2020-02-14 NOTE — Anesthesia Procedure Notes (Signed)
Procedure Name: Intubation Date/Time: 02/12/2020 11:19 AM Performed by: Vaughan Sine Pre-anesthesia Checklist: Patient identified, Emergency Drugs available, Suction available and Patient being monitored Patient Re-evaluated:Patient Re-evaluated prior to induction Oxygen Delivery Method: Circle system utilized Preoxygenation: Pre-oxygenation with 100% oxygen Induction Type: IV induction Laryngoscope Size: McGraph and 4 Grade View: Grade III Tube type: Oral Tube size: 7.0 mm Number of attempts: 2 Airway Equipment and Method: Stylet and Video-laryngoscopy Placement Confirmation: ETT inserted through vocal cords under direct vision,  positive ETCO2 and breath sounds checked- equal and bilateral Secured at: 23 cm Tube secured with: Tape Dental Injury: Teeth and Oropharynx as per pre-operative assessment  Difficulty Due To: Difficult Airway- due to anterior larynx and Difficulty was anticipated Future Recommendations: Recommend- induction with short-acting agent, and alternative techniques readily available

## 2020-02-14 NOTE — Op Note (Signed)
[facility] Gastroenterology Patient Name: Patrick Hurst Procedure Date: 02/04/2020 10:41 AM MRN: [id number] Account #: [id number] Date of Birth: 1956/02/22 Admit Type: Outpatient Age: 64 Room: [facility] ENDO ROOM 1 Gender: Male Note Status: Finalized Procedure:             Colonoscopy Indications:           Screening for colorectal malignant neoplasm, Last                         colonoscopy: August 2021 Providers:             Lin Landsman MD, MD Referring MD:          Kirstie Peri. Caryn Section, MD (Referring MD) Medicines:             General Anesthesia Complications:         Cardiopulmonary arrest, with ROSC after CPR,                         transferred to ICU Procedure:             Pre-Anesthesia Assessment:                        - Prior to the procedure, a History and Physical was                         performed, and patient medications and allergies were                         reviewed. The patient is competent. The risks and                         benefits of the procedure and the sedation options and                         risks were discussed with the patient. All questions                         were answered and informed consent was obtained.                         Patient identification and proposed procedure were                         verified by the physician, the nurse, the                         anesthesiologist, the anesthetist and the technician                         in the pre-procedure area in the procedure room in the                         endoscopy suite. Mental Status Examination: alert and                         oriented. Airway Examination: small/crowded  oropharyngeal airway. Respiratory Examination: clear                         to auscultation. CV Examination: irregularly irregular                         rate and rhythm. Prophylactic Antibiotics: The patient                         does not require  prophylactic antibiotics. Prior                         Anticoagulants: The patient has taken Eliquis                         (apixaban), last dose was 3 days prior to procedure.                         ASA Grade Assessment: IV - A patient with severe                         systemic disease that is a constant threat to life.                         After reviewing the risks and benefits, the patient                         was deemed in satisfactory condition to undergo the                         procedure. The anesthesia plan was to use general                         anesthesia. Immediately prior to administration of                         medications, the patient was re-assessed for adequacy                         to receive sedatives. The heart rate, respiratory                         rate, oxygen saturations, blood pressure, adequacy of                         pulmonary ventilation, and response to care were                         monitored throughout the procedure. The physical                         status of the patient was re-assessed after the                         procedure.                        After obtaining informed consent, the colonoscope was  passed under direct vision. Throughout the procedure,                         the patient's blood pressure, pulse, and oxygen                         saturations were monitored continuously. The                         Colonoscope was introduced through the anus and                         advanced to the the cecum, identified by appendiceal                         orifice and ileocecal valve. The colonoscopy was                         extremely difficult due to the patient's respiratory                         instability (hypoxia). Successful completion of the                         procedure was aided by scope withdrawn, procedure was                         aborted, code blue was initiated. The  colonoscopy was                         aborted. Findings:      The perianal and digital rectal examinations were normal. Pertinent       negatives include normal sphincter tone and no palpable rectal lesions.      Procedure was aborted due to cardiovascular arrest Impression:            - The procedure was aborted.                        - No specimens collected. Recommendation:        - Return patient to ICU for ongoing care. Procedure Code(s):     --- Professional ---                        M5784, 53, Colorectal cancer screening; colonoscopy on                         individual not meeting criteria for high risk Diagnosis Code(s):     --- Professional ---                        Z12.11, Encounter for screening for malignant neoplasm                         of colon CPT copyright 2019 [employer]. All rights reserved. The codes documented in this report are preliminary and upon coder review may  be revised to meet current compliance requirements. Dr. Ulyess Mort Lin Landsman MD, MD 02/22/2020 11:49:57 AM This report has been signed electronically. Number of Addenda:  0 Note Initiated On: 02/02/2020 10:41 AM Scope Withdrawal Time: 0 hours 1 minute 3 seconds  Total Procedure Duration: 0 hours 5 minutes 45 seconds  Estimated Blood Loss:  Estimated blood loss: none.      [facility]

## 2020-02-14 NOTE — Progress Notes (Signed)
*  PRELIMINARY RESULTS* Echocardiogram 2D Echocardiogram has been performed.  Sherrie Sport 02/18/2020, 1:32 PM

## 2020-02-15 ENCOUNTER — Other Ambulatory Visit: Payer: Self-pay

## 2020-02-15 ENCOUNTER — Inpatient Hospital Stay: Payer: Commercial Managed Care - PPO

## 2020-02-15 LAB — TYPE AND SCREEN
ABO/RH(D): A POS
Antibody Screen: NEGATIVE
Unit division: 0
Unit division: 0
Unit division: 0

## 2020-02-15 LAB — CBC WITH DIFFERENTIAL/PLATELET
Abs Immature Granulocytes: 0.13 10*3/uL — ABNORMAL HIGH (ref 0.00–0.07)
Basophils Absolute: 0 10*3/uL (ref 0.0–0.1)
Basophils Relative: 1 %
Eosinophils Absolute: 0.1 10*3/uL (ref 0.0–0.5)
Eosinophils Relative: 1 %
HCT: 30.5 % — ABNORMAL LOW (ref 39.0–52.0)
Hemoglobin: 10 g/dL — ABNORMAL LOW (ref 13.0–17.0)
Immature Granulocytes: 2 %
Lymphocytes Relative: 8 %
Lymphs Abs: 0.5 10*3/uL — ABNORMAL LOW (ref 0.7–4.0)
MCH: 30.6 pg (ref 26.0–34.0)
MCHC: 32.8 g/dL (ref 30.0–36.0)
MCV: 93.3 fL (ref 80.0–100.0)
Monocytes Absolute: 0.6 10*3/uL (ref 0.1–1.0)
Monocytes Relative: 9 %
Neutro Abs: 5.2 10*3/uL (ref 1.7–7.7)
Neutrophils Relative %: 79 %
Platelets: 125 10*3/uL — ABNORMAL LOW (ref 150–400)
RBC: 3.27 MIL/uL — ABNORMAL LOW (ref 4.22–5.81)
RDW: 16.3 % — ABNORMAL HIGH (ref 11.5–15.5)
WBC: 6.6 10*3/uL (ref 4.0–10.5)
nRBC: 0.6 % — ABNORMAL HIGH (ref 0.0–0.2)

## 2020-02-15 LAB — CBC
HCT: 33.3 % — ABNORMAL LOW (ref 39.0–52.0)
HCT: 24.4 % — ABNORMAL LOW (ref 39.0–52.0)
Hemoglobin: 10.9 g/dL — ABNORMAL LOW (ref 13.0–17.0)
Hemoglobin: 8.4 g/dL — ABNORMAL LOW (ref 13.0–17.0)
MCH: 30.5 pg (ref 26.0–34.0)
MCH: 31.3 pg (ref 26.0–34.0)
MCHC: 32.7 g/dL (ref 30.0–36.0)
MCHC: 34.4 g/dL (ref 30.0–36.0)
MCV: 93.3 fL (ref 80.0–100.0)
MCV: 91 fL (ref 80.0–100.0)
Platelets: 116 10*3/uL — ABNORMAL LOW (ref 150–400)
Platelets: 123 10*3/uL — ABNORMAL LOW (ref 150–400)
RBC: 3.57 MIL/uL — ABNORMAL LOW (ref 4.22–5.81)
RBC: 2.68 MIL/uL — ABNORMAL LOW (ref 4.22–5.81)
RDW: 16.8 % — ABNORMAL HIGH (ref 11.5–15.5)
RDW: 17.1 % — ABNORMAL HIGH (ref 11.5–15.5)
WBC: 6.8 10*3/uL (ref 4.0–10.5)
WBC: 6.4 10*3/uL (ref 4.0–10.5)
nRBC: 0.6 % — ABNORMAL HIGH (ref 0.0–0.2)
nRBC: 0.9 % — ABNORMAL HIGH (ref 0.0–0.2)

## 2020-02-15 LAB — BPAM RBC
Blood Product Expiration Date: 202110142359
Blood Product Expiration Date: 202111072359
Blood Product Expiration Date: 202111082359
ISSUE DATE / TIME: 202110141849
ISSUE DATE / TIME: 202110142129
Unit Type and Rh: 5100
Unit Type and Rh: 6200
Unit Type and Rh: 6200

## 2020-02-15 LAB — RENAL FUNCTION PANEL
Albumin: 2.9 g/dL — ABNORMAL LOW (ref 3.5–5.0)
Anion gap: 30 — ABNORMAL HIGH (ref 5–15)
BUN: 90 mg/dL — ABNORMAL HIGH (ref 8–23)
CO2: 21 mmol/L — ABNORMAL LOW (ref 22–32)
Calcium: 6.4 mg/dL — CL (ref 8.9–10.3)
Chloride: 86 mmol/L — ABNORMAL LOW (ref 98–111)
Creatinine, Ser: 18.6 mg/dL — ABNORMAL HIGH (ref 0.61–1.24)
GFR, Estimated: 2 mL/min — ABNORMAL LOW (ref >60)
Glucose, Bld: 143 mg/dL — ABNORMAL HIGH (ref 70–99)
Phosphorus: 13.5 mg/dL — ABNORMAL HIGH (ref 2.5–4.6)
Potassium: 5.4 mmol/L — ABNORMAL HIGH (ref 3.5–5.1)
Sodium: 137 mmol/L (ref 135–145)

## 2020-02-15 LAB — BASIC METABOLIC PANEL
Anion gap: 32 — ABNORMAL HIGH (ref 5–15)
BUN: 91 mg/dL — ABNORMAL HIGH (ref 8–23)
CO2: 22 mmol/L (ref 22–32)
Calcium: 6.5 mg/dL — ABNORMAL LOW (ref 8.9–10.3)
Chloride: 87 mmol/L — ABNORMAL LOW (ref 98–111)
Creatinine, Ser: 18.82 mg/dL — ABNORMAL HIGH (ref 0.61–1.24)
GFR, Estimated: 2 mL/min — ABNORMAL LOW (ref >60)
Glucose, Bld: 169 mg/dL — ABNORMAL HIGH (ref 70–99)
Potassium: 5.1 mmol/L (ref 3.5–5.1)
Sodium: 141 mmol/L (ref 135–145)

## 2020-02-15 LAB — GLUCOSE, CAPILLARY
Glucose-Capillary: 149 mg/dL — ABNORMAL HIGH (ref 70–99)
Glucose-Capillary: 174 mg/dL — ABNORMAL HIGH (ref 70–99)
Glucose-Capillary: 122 mg/dL — ABNORMAL HIGH (ref 70–99)
Glucose-Capillary: 115 mg/dL — ABNORMAL HIGH (ref 70–99)
Glucose-Capillary: 84 mg/dL (ref 70–99)
Glucose-Capillary: 155 mg/dL — ABNORMAL HIGH (ref 70–99)

## 2020-02-15 LAB — MAGNESIUM: Magnesium: 2.8 mg/dL — ABNORMAL HIGH (ref 1.7–2.4)

## 2020-02-15 LAB — PHOSPHORUS: Phosphorus: 13.5 mg/dL — ABNORMAL HIGH (ref 2.5–4.6)

## 2020-02-15 LAB — BLOOD GAS, ARTERIAL

## 2020-02-15 MED ORDER — AMIODARONE HCL IN DEXTROSE 360-4.14 MG/200ML-% IV SOLN
60.0000 mg/h | INTRAVENOUS | Status: DC
  Administered 2020-02-16 (×2): 30 mg/h via INTRAVENOUS
  Administered 2020-02-17: 60 mg/h via INTRAVENOUS
  Filled 2020-02-15 (×3): qty 200

## 2020-02-15 MED ORDER — ALBUMIN HUMAN 25 % IV SOLN
12.5000 g | Freq: Once | INTRAVENOUS | Status: AC
  Administered 2020-02-15: 12.5 g via INTRAVENOUS
  Filled 2020-02-15: qty 50

## 2020-02-15 MED ORDER — CALCIUM GLUCONATE-NACL 1-0.675 GM/50ML-% IV SOLN
1.0000 g | Freq: Once | INTRAVENOUS | Status: AC
  Administered 2020-02-15: 1000 mg via INTRAVENOUS
  Filled 2020-02-15: qty 50

## 2020-02-15 MED ORDER — AMIODARONE HCL IN DEXTROSE 360-4.14 MG/200ML-% IV SOLN
60.0000 mg/h | INTRAVENOUS | Status: AC
  Administered 2020-02-15: 60 mg/h via INTRAVENOUS
  Filled 2020-02-15: qty 200

## 2020-02-15 MED ORDER — AMIODARONE HCL IN DEXTROSE 360-4.14 MG/200ML-% IV SOLN
INTRAVENOUS | Status: AC
  Administered 2020-02-15: 60 mg/h via INTRAVENOUS
  Filled 2020-02-15: qty 200

## 2020-02-15 NOTE — Progress Notes (Signed)
[facility]  ROUNDING NOTE   Subjective:   Peritoneal dialysis last night with 1.5% dextrose. Minimal ultrafiltration. .  Status post PRBC transfusion.    Objective:  Vital signs in last 24 hours:  Temp:  [95.9 F (35.5 C)-97.8 F (36.6 C)] 96.4 F (35.8 C) (10/15 0700) Pulse Rate:  [39-88] 62 (10/15 0900) Resp:  [16-27] 24 (10/15 0900) BP: (90-166)/(32-78) 100/40 (10/15 0900) SpO2:  [92 %-99 %] 96 % (10/15 0900) Arterial Line BP: (101-168)/(49-83) 118/59 (10/15 0900) FiO2 (%):  [45 %] 45 % (10/15 0814) Weight:  [131.2 kg-136 kg] 136 kg (10/15 0500)  Weight change:  Filed Weights   02/28/2020 0916 02/05/2020 1200 02/15/20 0500  Weight: 130.2 kg 131.2 kg 136 kg    Intake/Output: I/O last 3 completed shifts: In: 1739.2 [I.V.:1339.2; Blood:400] Out: 400 [Emesis/NG output:400]   Intake/Output this shift:  Total I/O In: 177.9 [I.V.:127.9; IV Piggyback:50] Out: 0   Physical Exam: General: critically ill  Head: + ETT   Eyes: Anicteric  Lungs:  Ventilator assisted,PRVC FiO2 45%,Lungs diminished at the bases  Heart: Regular rate and rhythm  Abdomen:  Obese,  Extremities:  No  peripheral edema.  Neurologic: Intubated and sedated  Skin: No acute lesions or rashes noted  Access: PD catheter, RIJ permcath, and left arm AVF with bruit but no thrill    Basic Metabolic Panel: Recent Labs  Lab 02/03/2020 1228 02/15/20 0100 02/15/20 0440  NA 140 141  --   K 5.9* 5.1  --   CL 85* 87*  --   CO2 23 22  --   GLUCOSE 316* 169*  --   BUN 91* 91*  --   CREATININE 19.17* 18.82*  --   CALCIUM 7.3* 6.5*  --   MG 2.8*  --  2.8*  PHOS 15.8*  --  13.5*    Liver Function Tests: Recent Labs  Lab 02/06/2020 1228  AST 17  ALT 14  ALKPHOS 73  BILITOT 1.0  PROT 7.4  ALBUMIN 3.2*   No results for input(s): LIPASE, AMYLASE in the last 168 hours. No results for input(s): AMMONIA in the last 168 hours.  CBC: Recent Labs  Lab 02/07/2020 1228 02/15/20 0045  02/15/20 0440  WBC 6.4 6.6 6.8  NEUTROABS 5.0 5.2  --   HGB 6.1* 10.0* 10.9*  HCT 19.4* 30.5* 33.3*  MCV 96.0 93.3 93.3  PLT 124* 125* 116*    Cardiac Enzymes: Recent Labs  Lab 02/15/2020 1228  CKMB 33.0*    BNP: Invalid input(s): POCBNP  CBG: Recent Labs  Lab 02/27/2020 1700 02/12/2020 2003 02/12/2020 2354 02/15/20 0354 02/15/20 0722  GLUCAP 302* 195* 162* 149* 68*    Microbiology: Results for orders placed or performed during the hospital encounter of 02/04/2020  Culture, blood (routine x 2)     Status: None (Preliminary result)   Collection Time: 02/18/2020 12:29 PM   Specimen: BLOOD  Result Value Ref Range Status   Specimen Description BLOOD RIGHT ANTECUBITAL  Final   Special Requests   Final    BOTTLES DRAWN AEROBIC AND ANAEROBIC Blood Culture adequate volume   Culture   Final    NO GROWTH < 24 HOURS Performed at [facility], [address] [place], [place] [address]    Report Status PENDING  Incomplete  Culture, blood (routine x 2)     Status: None (Preliminary result)   Collection Time: 02/18/2020 12:30 PM   Specimen: BLOOD  Result Value Ref Range Status  Specimen Description BLOOD BLOOD RIGHT HAND  Final   Special Requests   Final    BOTTLES DRAWN AEROBIC AND ANAEROBIC Blood Culture adequate volume   Culture   Final    NO GROWTH < 24 HOURS Performed at [facility], [address] [place], [place] [address]    Report Status PENDING  Incomplete  MRSA PCR Screening     Status: None   Collection Time: 02/09/2020 12:48 PM   Specimen: Nasal Mucosa; Nasopharyngeal  Result Value Ref Range Status   MRSA by PCR NEGATIVE NEGATIVE Final    Comment:        The GeneXpert MRSA Assay (FDA approved for NASAL specimens only), is one component of a comprehensive MRSA colonization surveillance program. It is not intended to diagnose MRSA infection nor to guide or monitor treatment for MRSA infections. Performed at [facility], [address] [place], [place] [address]     Coagulation Studies: Recent Labs    02/15/2020 1228  LABPROT 15.2  INR 1.3*    Urinalysis: No results for input(s): COLORURINE, LABSPEC, PHURINE, GLUCOSEU, HGBUR, BILIRUBINUR, KETONESUR, PROTEINUR, UROBILINOGEN, NITRITE, LEUKOCYTESUR in the last 72 hours.  Invalid input(s): APPERANCEUR    Imaging: DG Abd 1 View  Result Date: 02/25/2020 CLINICAL DATA:  OG tube placement EXAM: ABDOMEN - 1 VIEW COMPARISON:  July 26, 2019 FINDINGS: The enteric tube projects over the gastric body. The visualized bowel gas pattern is nonspecific. IMPRESSION: The enteric tube projects over the gastric body. Electronically Signed   By: Constance Holster M.D.   On: 02/16/2020 17:55   CT HEAD WO CONTRAST  Result Date: 02/20/2020 CLINICAL DATA:  Status post cardiac arrest EXAM: CT HEAD WITHOUT CONTRAST TECHNIQUE: Contiguous axial images were obtained from the base of the skull through the vertex without intravenous contrast. COMPARISON:  March 13, 2012 head CT and brain MRI. FINDINGS: Brain: Ventricles and sulci are normal in size and configuration. There is no intracranial mass, hemorrhage, extra-axial fluid collection, or midline shift. There is preservation of gray-white differentiation without evident cerebral edema. Brain parenchyma appears unremarkable. No acute infarct evident. There is a mild area of deep sulcal asymmetry in the right temporal region compared to the left, stable and a presumed anatomic variant. Vascular: No hyperdense vessel. There is calcification in each carotid siphon region. Skull: The bony calvarium appears intact. Sinuses/Orbits: Visualized paranasal sinuses are clear. Orbits appear symmetric bilaterally. Other: Mastoid air cells are clear. IMPRESSION: Preservation of gray-white differentiation without edema. No acute infarct. No mass or hemorrhage. Foci of arterial vascular calcification noted. Electronically Signed   By: Lowella Grip  III M.D.   On: 02/06/2020 13:03   DG Chest Port 1 View  Result Date: 02/15/2020 CLINICAL DATA:  Respiratory failure. EXAM: PORTABLE CHEST 1 VIEW COMPARISON:  Chest x-ray 02/10/2020, CT chest 11/19/2019 FINDINGS: Endotracheal tube with tip approximately 8 cm above the carina. Enteric tube courses below diaphragm with tip and side port overlying the expected region the gastric lumen. PO contrast is noted partially opacifying the gastric lumen. Right chest wall dialysis catheter again noted with tip overlying the right atrium with tip near the inferior cavoatrial junction a (tip likely more cranially within the right atrium; however, due to low lung volumes tip is near the junction). The heart size and mediastinal contours are unchanged. Low lung volumes. Vague patchy airspace opacities. Increased interstitial markings. No pleural effusion. No pneumothorax. No acute osseous abnormality. IMPRESSION: 1. Enteric tube with tip terminating 8 cm  above the carina. Consider advancing by 2 cm. 2. Low lung volumes with vague patchy airspace opacities that likely represent atelectasis. 3. Cardiomegaly with mild pulmonary edema. Electronically Signed   By: Iven Finn M.D.   On: 02/15/2020 02:28   DG Chest Port 1 View  Result Date: 02/05/2020 CLINICAL DATA:  Intubation EXAM: PORTABLE CHEST 1 VIEW COMPARISON:  12/27/2019 FINDINGS: There is a well-positioned tunneled dialysis catheter on the right. The endotracheal tube terminates above the carina and just below the thoracic inlet. The tube terminates approximately 8 cm above the carina. The enteric tube appears to extend below the left hemidiaphragm. The heart size is stable but enlarged. The lung volumes are low. There is vascular congestion with small bilateral pleural effusions and bibasilar atelectasis. There is no acute osseous abnormality. IMPRESSION: 1. Lines and tubes as above. 2. Low lung volumes with bibasilar atelectasis and small bilateral pleural  effusions. 3. Cardiomegaly with vascular congestion. Electronically Signed   By: Constance Holster M.D.   On: 02/26/2020 17:54   EEG adult  Result Date: 02/12/2020 Alexis Goodell, MD     02/13/2020  3:29 PM ELECTROENCEPHALOGRAM REPORT Patient: Patrick Hurst       Room #: IC06A EEG No. ID: [id number] Age: 64 y.o.        Sex: male Requesting Physician: Kasa Report Date:  02/07/2020       Interpreting Physician: Alexis Goodell History: Patrick Hurst is an 64 y.o. male with PEA arrest during colonoscopy Medications: Fentanyl, Propofol, Levophed, ASA, Conditions of Recording:  This is a 21 channel routine scalp EEG performed with bipolar and monopolar montages arranged in accordance to the international 10/20 system of electrode placement. One channel was dedicated to EKG recording. The patient is in the intubated and sedated state. Description:  The background activity is very low voltage and often obscured by low voltage artifact and electrocardiogram artifact.   At times some very slow polymorphic delta activity could be identified.  No epileptiform activity is noted.  The patient was stimulated during the recording with no activation of the background rhythm noted.  Hyperventilation and intermittent photic stimulation were not performed. IMPRESSION: This electroencephalogram is characterized by very slow, low voltage background activity that is consistent with a medication effect, but can not rule out a severe diffuse cerebral disturbance as well, etiologically nonspecific.  Clinical correlation recommended.  No epileptiform activity is noted.  Alexis Goodell, MD Neurology 02/05/2020, 2:43 PM   ECHOCARDIOGRAM COMPLETE  Result Date: 02/21/2020    ECHOCARDIOGRAM REPORT   Patient Name:   Patrick Hurst Date of Exam: 02/07/2020 Medical Rec #:  [id number]      Height:       72.0 in Accession #:    [id number]     Weight:       289.2 lb Date of Birth:  October 25, 1955      BSA:          2.492 m Patient Age:    50  years       BP:           121/46 mmHg Patient Gender: M              HR:           88 bpm. Exam Location:  [facility] Procedure: 2D Echo, Cardiac Doppler and Color Doppler Indications:     Cardiac Arrest 146.9  History:         Patient has prior history of Echocardiogram examinations,  most                  recent 11/16/2019. CHF.  Sonographer:     Sherrie Sport RDCS (AE) Referring Phys:  [id number] BRITTON L [place] Diagnosing Phys: Ida Rogue MD  Sonographer Comments: Echo performed with patient supine and on artificial respirator. IMPRESSIONS  1. Left ventricular ejection fraction, by estimation, is 50 to 55%. The left ventricle has low normal function. The left ventricle has no regional wall motion abnormalities. There is moderate left ventricular hypertrophy. Indeterminate diastolic filling  due to E-A fusion.  2. The aortic valve was not well visualized. Aortic valve regurgitation is not visualized. Mild to moderate aortic valve sclerosis/calcification is present, without any evidence of aortic stenosis. FINDINGS  Left Ventricle: Left ventricular ejection fraction, by estimation, is 50 to 55%. The left ventricle has low normal function. The left ventricle has no regional wall motion abnormalities. The left ventricular internal cavity size was normal in size. There is moderate left ventricular hypertrophy. Indeterminate diastolic filling due to E-A fusion. Right Ventricle: The right ventricular size is normal. No increase in right ventricular wall thickness. Right ventricular systolic function is normal. Tricuspid regurgitation signal is inadequate for assessing PA pressure. Left Atrium: Left atrial size was normal in size. Right Atrium: Right atrial size was normal in size. Pericardium: There is no evidence of pericardial effusion. Mitral Valve: The mitral valve is normal in structure. Mild to moderate mitral annular calcification. No evidence of mitral valve regurgitation. No evidence of mitral valve stenosis.  Tricuspid Valve: The tricuspid valve is normal in structure. Tricuspid valve regurgitation is not demonstrated. No evidence of tricuspid stenosis. Aortic Valve: The aortic valve was not well visualized. Aortic valve regurgitation is not visualized. Mild to moderate aortic valve sclerosis/calcification is present, without any evidence of aortic stenosis. Aortic valve mean gradient measures 5.5 mmHg.  Aortic valve peak gradient measures 10.2 mmHg. Aortic valve area, by VTI measures 2.12 cm. Pulmonic Valve: The pulmonic valve was normal in structure. Pulmonic valve regurgitation is not visualized. No evidence of pulmonic stenosis. Aorta: The aortic root is normal in size and structure. Venous: The inferior vena cava is normal in size with greater than 50% respiratory variability, suggesting right atrial pressure of 3 mmHg. IAS/Shunts: No atrial level shunt detected by color flow Doppler.  LEFT VENTRICLE PLAX 2D LVIDd:         4.64 cm  Diastology LVIDs:         2.94 cm  LV e' medial:    7.29 cm/s LV PW:         1.74 cm  LV E/e' medial:  12.2 LV IVS:        1.94 cm  LV e' lateral:   4.46 cm/s LVOT diam:     2.30 cm  LV E/e' lateral: 19.9 LV SV:         58 LV SV Index:   23 LVOT Area:     4.15 cm  LEFT ATRIUM             Index       RIGHT ATRIUM           Index LA Vol (A2C):   71.8 ml 28.81 ml/m RA Area:     27.80 cm LA Vol (A4C):   47.2 ml 18.94 ml/m RA Volume:   99.70 ml  40.01 ml/m LA Biplane Vol: 61.2 ml 24.56 ml/m  AORTIC VALVE  PULMONIC VALVE AV Area (Vmax):    2.03 cm     PV Vmax:        0.93 m/s AV Area (Vmean):   1.97 cm     PV Peak grad:   3.4 mmHg AV Area (VTI):     2.12 cm     RVOT Peak grad: 5 mmHg AV Vmax:           160.00 cm/s AV Vmean:          106.000 cm/s AV VTI:            0.272 m AV Peak Grad:      10.2 mmHg AV Mean Grad:      5.5 mmHg LVOT Vmax:         78.30 cm/s LVOT Vmean:        50.300 cm/s LVOT VTI:          0.139 m LVOT/AV VTI ratio: 0.51  AORTA Ao Root diam: 3.60 cm  MITRAL VALVE               TRICUSPID VALVE MV Area (PHT): 2.48 cm    TR Peak grad:   14.4 mmHg MV Decel Time: 306 msec    TR Vmax:        190.00 cm/s MV E velocity: 88.80 cm/s MV A velocity: 47.50 cm/s  SHUNTS MV E/A ratio:  1.87        Systemic VTI:  0.14 m                            Systemic Diam: 2.30 cm Ida Rogue MD Electronically signed by Ida Rogue MD Signature Date/Time: 02/05/2020/4:14:37 PM    Final      Medications:   . sodium chloride    . sodium chloride 10 mL/hr at 02/15/20 0600  . dialysis solution 1.5% low-MG/low-CA    . famotidine (PEPCID) IV Stopped (02/11/2020 1747)  . fentaNYL infusion INTRAVENOUS 150 mcg/hr (02/15/20 0800)  . norepinephrine (LEVOPHED) Adult infusion 5 mcg/min (02/15/20 0800)  . propofol (DIPRIVAN) infusion 35 mcg/kg/min (02/15/20 0821)   . sodium chloride   Intravenous Once  . chlorhexidine gluconate (MEDLINE KIT)  15 mL Mouth Rinse BID  . Chlorhexidine Gluconate Cloth  6 each Topical Daily  . docusate  100 mg Per Tube BID  . fentaNYL (SUBLIMAZE) injection  100 mcg Intravenous Once  . gentamicin cream  1 application Topical Daily  . heparin  5,000 Units Subcutaneous Q8H  . insulin aspart  0-6 Units Subcutaneous Q4H  . mouth rinse  15 mL Mouth Rinse 10 times per day  . polyethylene glycol  17 g Per Tube Daily  . sodium zirconium cyclosilicate  10 g Per Tube Daily   docusate sodium, fentaNYL, midazolam, ondansetron (ZOFRAN) IV, polyethylene glycol  Assessment/ Plan:  [name] is a 64 y.o. white male with end stage renal disease on peritoneal dialysis, hepatic cirrhosis, congestive heart failure, COPD, atrial fibrillation, depression, GERD, sleep apnea, and gout who was admitted after cardiac arrest prior to scheduled colonoscopy on 02/27/2020. Patient currently admitted to [facility] ICU requiring intubation, mechanical ventilation and vasopressors.   # ESRD on Peritoneal dialysis: peritoneal dialysis treatment last night. However with  poor clearance.  - Schedule for intermittent hemodialysis treatment later today. Orders prepared.  - discontinue lokelma  #Hypotension with cardiogenic shock: requiring vasopressors.  - holding metoprolol and torsemide  #Anemia with renal failure and GI bleed.  Status post transfusion. Follows with hematology and GI.   #Secondary Hyperparathyroidism with hyperphosphatemia.  -Patient is on Renvela as outpatient    LOS: 1 Maccoy Haubner 10/15/202111:06 AM

## 2020-02-15 NOTE — Progress Notes (Signed)
NAME:  Patrick Hurst, MRN:  [id number], DOB:  Oct 25, 1955, LOS: 1 ADMISSION DATE:  02/01/2020, CONSULTATION DATE:  02/10/2020 REFERRING MD:  Dr. Kenton Kingfisher, CHIEF COMPLAINT:  Cardiac Arrest   Brief History   64 yo male presented for outpatient colonoscopy PEA cardiac arrested during the procedure, s/p 10 minutes CPR, intubated requiring mechanical ventilation with Targeted Temperature Management admitted to the ICU.  History of present illness   65 yo male with a significant history of COPD, OSA, T2DM, ESRD, CHF, AF presented to Endoscopy for an outpatient colonoscopy procedure.  In the middle of the procedure his heart rate began to drop and he PEA arrested requiring 10 minutes of CPR prior to achieving ROSC.  He received epinephrine, atropine, calcium, sodium bicarb & D50.  He was intubated during the code blue requiring mechanical ventilation.   He had a recent admit in August 2021 for acute hypoxic respiratory failure and symptomatic anemia supported on BIPAP and completed a work up with heme/onc and GI.  This procedure was to complete this anemia work up that had been initiated during that hospitalization. The patient was unresponsive post arrest and the decision was made to activate TTM.  PCCM was consulted for further management and the patient was transferred to the ICU.   Significant Hospital Events    10/14 Code Blue in Endo procedure, intubated, admitted to ICU for TTM 02/15/20- patient evaluated at bedside. Spoke to wife at length and reviewed various testing and diagnostics as well as therapy received and short term care plan. Levophed at 58mcg/kg/min.  S/p PD therapy this am. Patient with atelectasis on CXR this am, weaning FiO2 on MV with plan for SBT in am.  Discussed care plan with renal team - Dr Juleen China considering HD.     Past Medical History  OSA with Bipap HFpEF Bells Palsy T2DM Proliferative diabetic retinopathy ESRD on peritoneal dialysis with HD also AFib on  eliquis NASH cirrhosis     Consults:  Nephrology  Procedures:  10/14 ETT >> 10/14 Arterial line >> 10/14 CVC 3L>> Significant Diagnostic Tests:  03/02/2020 CT head >> preservation of gray-white matter differentiation without edema. No acute infarct, no mass of hemorrhage.  Micro Data:  10/14 MRSA PCR >> 10/14 BC >> 10/14 Tracheal aspirate >>  Antimicrobials:    Interim history/subjective:  Patient critically ill, intubated & sedated requiring mechanical ventilation.   Wife and son updated bedside.  Labs/ Imaging personally reviewed Na+/ K+: 140/ 5.9 BUN/Cr.: 91/ 19.17 Hgb: 6.1 Troponin: 169 ~ 175 WBC: 6.4  ABG: 7.35/ 42/ 87/ 23.2 (s/p intubation)  Objective   Blood pressure (!) 132/49, pulse 60, temperature (!) 96.4 F (35.8 C), resp. rate (!) 24, height 6' (1.829 m), weight 136 kg, SpO2 96 %.    Vent Mode: PRVC FiO2 (%):  [45 %] 45 % Set Rate:  [24 bmp] 24 bmp Vt Set:  [550 mL] 550 mL PEEP:  [5 cmH20] 5 cmH20 Plateau Pressure:  [14 cmH20] 14 cmH20   Intake/Output Summary (Last 24 hours) at 02/15/2020 0806 Last data filed at 02/15/2020 0600 Gross per 24 hour  Intake 1739.18 ml  Output 400 ml  Net 1339.18 ml   Filed Weights   02/08/2020 0916 02/13/2020 1200 02/15/20 0500  Weight: 130.2 kg 131.2 kg 136 kg    Examination: General: Adult male, critically ill, lying in bed intubated & sedated requiring mechanical ventilation HEENT: atraumatic, neck supple, MM pink/moist, anicteric Neuro: unresponsive, unable to follow commands, Pupils unequal R #5/  L #2 both sluggish, myoclonus jerking activity CV: s1s2 RRR, NSR on monitor, no r/m/g Pulm: Regular, non labored on PRVC at 45% >>40%, breath sounds diminished breath sounds throughout GI: soft, rounded with PD cath in place, bs x 4 Skin: intact, no rashes/lesions noted Extremities: warm/dry, pulses + 2 R/P, no edema noted  Resolved Hospital Problem list     Assessment & Plan:  Cardiac arrest Patient in the  middle of an outpatient elective colonoscopy for colon cancer screening. Per endo team, patient became bradycardic and PEA arrested 10 minutes of CPR, concern for possible anoxic injury  Circulatory shock 10 minutes downtime, CPR initiated immediately - 36 degree TTM initiated per protocol - CT head obtained - CVC & arterial line placed - Continue levophed PRN, goal MAP > 65 - Echo ordered - Trend troponin, lactate  Acute hypoxic respiratory failure  PMHx: COPD, OSA on bipap In the setting of PEA cardiac arrest, patient intubated during CODE BLUE event - Ventilator settings: PRVC at 45%, PEEP 5, 74mL/kg  - Wean PEEP and FiO2 for sats greater than 90%. - Follow intermittent chest x-ray and ABG.   - daily WUA & SBT once TTM completed - VAP bundle in place  - PAD protocol: propofol & fentanyl  At risk for anoxic encephalopathy. 10 minutes of CPR prior to achieving ROSC - Sedation  Propofol & fentanyl, will utilize NMB PRN for shivering - Hold daily WUA & SBT until TTM completed - Neuro consult once rewarmed as needed  Hyperkalemia - K+ 5.9 PMHx: ESRD Patient on PD and HD PRN outpatient - Unable to give veltassa via OGT, lokelma ordered QD - Strict I/O's: alert provider if UOP < 0.5 mL/kg/hr - Q 12 BMP, replace electrolytes PRN - Avoid nephrotoxic agents as able, ensure adequate renal perfusion - Nephrology following, appreciate input  Anemia Hbg: 6.1 - 2 units PRBC's ordered for transfusion, recheck H&H per protocol - Monitor for s/s of bleeding - Daily CBC - Transfuse for Hgb <7  Atrial Fibrillation Patient currently in NSR on monitor - home medications: amiodarone & eliquis on hold - consider restarting amiodarone 10/15 via OGT  At risk for multiple metabolic derangements during cooling. - NS @ 10 mL (ESRD patient) - Correct electrolytes as indicated. - BMP q12hrs per TTM protocol     Best practice:  Diet: NPO Pain/Anxiety/Delirium protocol (if indicated):  propofol & fentanyl VAP protocol (if indicated): initiated DVT prophylaxis: heparin SQ GI prophylaxis: pepcid Glucose control: Q 4 CBG, target range 140-180 Mobility: bedrest Code Status: FULL Family Communication: Updated wife & son bedside Disposition: ICU  Labs   CBC: Recent Labs  Lab 02/19/2020 1228 02/15/20 0045 02/15/20 0440  WBC 6.4 6.6 6.8  NEUTROABS 5.0 5.2  --   HGB 6.1* 10.0* 10.9*  HCT 19.4* 30.5* 33.3*  MCV 96.0 93.3 93.3  PLT 124* 125* 116*    Basic Metabolic Panel: Recent Labs  Lab 02/12/2020 1228 02/15/20 0100 02/15/20 0440  NA 140 141  --   K 5.9* 5.1  --   CL 85* 87*  --   CO2 23 22  --   GLUCOSE 316* 169*  --   BUN 91* 91*  --   CREATININE 19.17* 18.82*  --   CALCIUM 7.3* 6.5*  --   MG 2.8*  --  2.8*  PHOS 15.8*  --  13.5*   GFR: Estimated Creatinine Clearance: 5.8 mL/min (A) (by C-G formula based on SCr of 18.82 mg/dL (H)). Recent Labs  Lab 02/26/2020 1228 02/16/2020 1459 02/15/20 0045 02/15/20 0440  PROCALCITON 0.87  --   --   --   WBC 6.4  --  6.6 6.8  LATICACIDVEN 3.0* 1.7  --   --     Liver Function Tests: Recent Labs  Lab 02/27/2020 1228  AST 17  ALT 14  ALKPHOS 73  BILITOT 1.0  PROT 7.4  ALBUMIN 3.2*   No results for input(s): LIPASE, AMYLASE in the last 168 hours. No results for input(s): AMMONIA in the last 168 hours.  ABG    Component Value Date/Time   PHART 7.34 (L) 02/15/2020 0424   PCO2ART 41 02/15/2020 0424   PO2ART 85 02/15/2020 0424   HCO3 22.1 02/15/2020 0424   TCO2 22 11/08/2019 1118   ACIDBASEDEF 3.4 (H) 02/15/2020 0424   O2SAT 95.8 02/15/2020 0424     Coagulation Profile: Recent Labs  Lab 02/16/2020 1228  INR 1.3*    Cardiac Enzymes: Recent Labs  Lab 02/29/2020 1228  CKMB 33.0*    HbA1C: Hemoglobin A1C  Date/Time Value Ref Range Status  11/12/2019 08:43 AM 5.8 (A) 4.0 - 5.6 % Final  07/11/2019 08:22 AM 5.6 4.0 - 5.6 % Final  03/14/2012 03:21 AM 5.0 4.2 - 6.3 % Final    Comment:    The  American Diabetes Association recommends that a primary goal of therapy should be <7% and that physicians should reevaluate the treatment regimen in patients with HbA1c values consistently >8%.    Hgb A1c MFr Bld  Date/Time Value Ref Range Status  03/02/2019 12:17 AM 6.1 (H) 4.8 - 5.6 % Final    Comment:    (NOTE) Pre diabetes:          5.7%-6.4% Diabetes:              >6.4% Glycemic control for   <7.0% adults with diabetes   11/18/2017 10:12 AM 5.1 4.8 - 5.6 % Final    Comment:             Prediabetes: 5.7 - 6.4          Diabetes: >6.4          Glycemic control for adults with diabetes: <7.0     CBG: Recent Labs  Lab 02/10/2020 1700 03/02/2020 2003 02/12/2020 2354 02/15/20 0354 02/15/20 0722  GLUCAP 302* 195* 162* 149* 174*    Review of Systems:   UTA, patient s/p cardiac arrest, unresponsive- intubated requiring mechanical ventilation.  Past Medical History  He,  has a past medical history of Acute on chronic diastolic CHF (congestive heart failure) ([place]) (06/21/2018), Acute respiratory failure with hypoxia ([place]) (06/21/2018), Bell palsy (02/12/2015), Carpal tunnel syndrome (02/12/2015), Dependence on nocturnal oxygen therapy, Depression, Gastric ulcer, GERD (gastroesophageal reflux disease), Gout, History of chicken pox, Multifocal pneumonia (03/04/2019), and Sleep apnea treated with nocturnal BiPAP.   Surgical History    Past Surgical History:  Procedure Laterality Date  . AV FISTULA PLACEMENT Left 11/08/2019   Procedure: ARTERIOVENOUS (AV) FISTULA CREATION (RADIALCEPHALIC);  Surgeon: Algernon Huxley, MD;  Location: [facility] ORS;  Service: Vascular;  Laterality: Left;  . CATARACT EXTRACTION Bilateral 2014 and 2015  . CHOLECYSTECTOMY  04/28/2011   Laproscopic; Dr. Pat [place]  . COLONOSCOPY WITH PROPOFOL N/A 12/30/2019   Procedure: COLONOSCOPY WITH PROPOFOL;  Surgeon: Lin Landsman, MD;  Location[facility] END[facility]COPY;  Service: Gastroenterology;  Laterality: N/A;  . DIALYSIS/PERMA  CATHETER INSERTION N/A 05/24/2019   Procedure: DIALYSIS/PERMA CATHETER INSERTION;  Surgeon: Algernon Huxley, MD;  Location: [place] CV LAB;  Service: Cardiovascular;  Laterality: N/A;  . DIALYSIS/PERMA CATHETER INSERTION N/A 10/09/2019   Procedure: DIALYSIS/PERMA CATHETER INSERTION;  Surgeon: Katha Cabal, MD;  Location: [place] CV LAB;  Service: Cardiovascular;  Laterality: N/A;  . ESOPHAGOGASTRODUODENOSCOPY (EGD) WITH PROPOFOL N/A 12/29/2019   Procedure: ESOPHAGOGASTRODUODENOSCOPY (EGD) WITH PROPOFOL;  Surgeon: Lin Landsman, MD;  Location: [facility] ENDOSCOPY;  Service: Gastroenterology;  Laterality: N/A;  . EYE SURGERY    . GALLBLADDER SURGERY    . LASIK Bilateral 2019   medical  . SHOULDER SURGERY Right 2012   Dr. Leanor Kail     Social History   reports that he quit smoking about 41 years ago. His smoking use included cigarettes. He has a 15.00 pack-year smoking history. He has never used smokeless tobacco. He reports that he does not drink alcohol and does not use drugs.   Family History   His family history includes COPD in his mother; Cancer in his mother; Heart disease in his father.   Allergies Allergies  Allergen Reactions  . Mucinex [Guaifenesin Er] Swelling    Throat swelling, increases heart rate  . Levaquin [Levofloxacin] Palpitations     Home Medications  Prior to Admission medications   Medication Sig Start Date End Date Taking? Authorizing Provider  amiodarone (PACERONE) 200 MG tablet Take 200 mg by mouth daily. 08/16/19  Yes [provider]  amitriptyline (ELAVIL) 25 MG tablet TAKE 1 TABLET AT BEDTIME Patient taking differently: Take 25 mg by mouth at bedtime.  07/26/19  Yes [name], MD  atorvastatin (LIPITOR) 40 MG tablet Take 1 tablet (40 mg total) by mouth daily. 01/13/20  Yes [name], MD  Budeson-Glycopyrrol-Formoterol (BREZTRI AEROSPHERE) 160-9-4.8 MCG/ACT AERO Inhale 2 puffs into the lungs in the morning and at  bedtime.    Yes [provider]  calcitRIOL (ROCALTROL) 0.5 MCG capsule Take 0.5 mcg by mouth daily. 12/29/18  Yes [provider]  FLOMAX 0.4 MG CAPS capsule Take 0.4 mg by mouth daily. 01/02/20  Yes [provider]  insulin regular human CONCENTRATED (HUMULIN R) 500 UNIT/ML injection Inject up to 60 units twice a day as directed by physician 02/11/20  Yes [name], MD  Ipratropium-Albuterol (COMBIVENT RESPIMAT) 20-100 MCG/ACT AERS respimat Inhale 2 puffs into the lungs every 4 (four) hours as needed for wheezing.    Yes [provider]  lactulose (CHRONULAC) 10 GM/15ML solution Take 30 mLs by mouth daily. 09/10/19  Yes [provider]  levothyroxine (SYNTHROID) 100 MCG tablet TAKE 1 TABLET DAILY Patient taking differently: Take 100 mcg by mouth daily before breakfast.  08/01/19  Yes Fisher, Kirstie Peri, MD  metoprolol tartrate (LOPRESSOR) 50 MG tablet Take 0.5 tablets (25 mg total) by mouth 2 (two) times daily. 01/01/20  Yes Shawna Clamp, MD  pantoprazole (PROTONIX) 40 MG tablet TAKE 1 TABLET TWICE A DAY 02/05/20  Yes [name], MD  sevelamer carbonate (RENVELA) 800 MG tablet Take by mouth. 12/27/19  Yes [provider]  torsemide (DEMADEX) 10 MG tablet Take 40 mg by mouth 2 (two) times daily.  08/13/19  Yes [provider]  ULORIC 80 MG TABS Take 80 mg by mouth daily.  05/18/17  Yes [provider]  Ascorbic Acid (VITAMIN C) 1000 MG tablet Take 1,000 mg by mouth 2 (two) times daily.     [provider]  BD INSULIN SYRINGE U/F 31G X 5/16" 1 ML MISC  11/18/19   [provider]  cetirizine (ZYRTEC) 10 MG tablet Take 10 mg by mouth daily as needed for allergies.     [provider]  ELIQUIS 2.5 MG TABS tablet Take 2.5 mg by mouth 2 (two) times daily.  03/23/19   [provider]  gentamicin cream (GARAMYCIN) 0.1 % Apply 1 application topically daily. 09/22/19   [provider]   Spacer/Aero-Holding Chambers (AEROCHAMBER PLUS WITH MASK) inhaler See admin instructions. 12/05/19   [provider]  Spacer/Aero-Holding Josiah Lobo (EASIVENT) inhaler See admin instructions. 12/05/19 12/04/20  [provider]      Critical care provider statement:    Critical care time (minutes):  35   Critical care time was exclusive of:  Separately billable procedures and  treating other patients   Critical care was necessary to treat or prevent imminent or  life-threatening deterioration of the following conditions:  acute hypoxemic respiratory failure, cardiac arrest, AKI/CKD, COPD, OSA, obesity, DM, CHF, multiple comorbid conditions.    Critical care was time spent personally by me on the following  activities:  Development of treatment plan with patient or surrogate,  discussions with consultants, evaluation of patient's response to  treatment, examination of patient, obtaining history from patient or  surrogate, ordering and performing treatments and interventions, ordering  and review of laboratory studies and re-evaluation of patient's condition   I assumed direction of critical care for this patient from another  provider in my specialty: no       Ottie Glazier, M.D.  Pulmonary & [place]

## 2020-02-15 NOTE — Progress Notes (Signed)
CRITICAL VALUE ALERT  Critical Value:  Calcium   Date & Time Notied:  02/15/20  Provider Notified: Dr. Lanney Gins  Orders Received/Actions taken: N/A

## 2020-02-15 NOTE — Anesthesia Postprocedure Evaluation (Addendum)
Anesthesia Post Note  Patient: Patrick Hurst  Procedure(s) Performed: COLONOSCOPY WITH PROPOFOL (N/A )  Patient location during evaluation: ICU Anesthesia Type: General Level of consciousness: sedated Respiratory status: patient on ventilator - see flowsheet for VS Comments: MD was immediately notified by CRNA of acute event in endoscopy as it was unfolding and MD responded promptly at 1110.  Upon entry, it was noted that the HR was dropping precipitously to bradycardia in the 40s.  The airway was attended to promptly and an LMA was inserted immediately with bilateral breath sounds checked and equal.  CPR was started without delay as no pulse was felt leading to the diagnosis of PEA cardiac arrest.  A definitive ETT was placed during the code with bilateral breath sounds checked and equal.  During the code, the IV needed to be replaced as the original stopped working.  Appropriate epi given down the ETT and IV.  The patient regained BP and HR after 10 minutes of CPR and transferred to the unit.   No complications documented.   Last Vitals:  Vitals:   02/15/20 0500 02/15/20 0600  BP: (!) 107/44 (!) 132/49  Pulse: 65 63  Resp: (!) 24 (!) 24  Temp: (!) 35.5 C (!) 35.5 C  SpO2: 97% 97%    Last Pain:  Vitals:   02/15/20 0500  TempSrc: Rectal  PainSc:                  Lerry Liner

## 2020-02-15 NOTE — Progress Notes (Signed)
Pt heart rhythm changed from NSR to Sinus with frequent PVCs and then to Afib.  MD notified of changes, new orders given for albumin to be administered with dialysis treatment.  Will continue to monitor.

## 2020-02-15 NOTE — Progress Notes (Addendum)
Initial Nutrition Assessment  DOCUMENTATION CODES:   Obesity unspecified  INTERVENTION:   If tube feeds initiated, recommend:  Vital AF 1.2 @ 98ml/hr + Pro-Source 29ml- 5 times daily via tube   Propofol: 27.3 ml/hr- provides 721kcal/day   Free water flushes 73ml q4 hours to maintain tube patency   Regimen provides 1985kcal/day, 164g/day protein and 734ml/day free water.   Liquid MVI daily via tube  Rena-vite daily via tube   NUTRITION DIAGNOSIS:   Inadequate oral intake related to inability to eat (pt sedated and ventilated) as evidenced by NPO status.  GOAL:   Provide needs based on ASPEN/SCCM guidelines  MONITOR:   Vent status, Labs, Weight trends, Skin, I & O's  REASON FOR ASSESSMENT:   Ventilator    ASSESSMENT:   64 y.o. white male with end stage renal disease on peritoneal dialysis, hepatic cirrhosis, congestive heart failure, COPD, atrial fibrillation, depression, GERD, sleep apnea, and gout who was admitted after cardiac arrest prior to scheduled colonoscopy on 02/19/2020  Pt sedated and ventilated. OGT in place to LIS. No plans for tube feeds today. Pt getting PD overnight; plan is for intermittent HD. Pt on hypothermia protocol. Possible SBT trial tomorrow per MD.   Per chart, pt is weight stable pta.   Medications reviewed and include: colace, heparin, insulin, miralax, levophed, propofol  Labs reviewed: BUN 91(H), creat 18.82(H), Ca 6.5(L), P 13.5(H), Mg 2.8(H) BNP- 116.9(H)- 10/14 Hgb 10.9(L), Hct 33.3(L) cbgs- 149, 174, 122 x 24 hrs  Patient is currently intubated on ventilator support MV: 13.3 L/min Temp (24hrs), Avg:96.8 F (36 C), Min:95.9 F (35.5 C), Max:97.2 F (36.2 C)  Propofol: 27.3 ml/hr- provides 721kcal/day   MAP- 57-9mmHg  NUTRITION - FOCUSED PHYSICAL EXAM:    Most Recent Value  Orbital Region No depletion  Upper Arm Region No depletion  Thoracic and Lumbar Region No depletion  Buccal Region No depletion  Temple Region  No depletion  Clavicle Bone Region No depletion  Clavicle and Acromion Bone Region No depletion  Scapular Bone Region No depletion  Dorsal Hand No depletion  Patellar Region No depletion  Anterior Thigh Region No depletion  Posterior Calf Region No depletion  Edema (RD Assessment) Mild  Hair Reviewed  Eyes Reviewed  Mouth Reviewed  Skin Reviewed  Nails Reviewed     Diet Order:   Diet Order            Diet NPO time specified  Diet effective now                EDUCATION NEEDS:   No education needs have been identified at this time  Skin:  Skin Assessment: Reviewed RN Assessment  Last BM:  pta  Height:   Ht Readings from Last 1 Encounters:  02/29/2020 6' (1.829 m)    Weight:   Wt Readings from Last 1 Encounters:  02/15/20 136 kg    Ideal Body Weight:  80.9 kg  BMI:  Body mass index is 40.66 kg/m.  Estimated Nutritional Needs:   Kcal:  1496-1904kcal/day  Protein:  >160g/day  Fluid:  >2.4L/day  Koleen Distance MS, RD, LDN Please refer to [facility] for RD and/or RD on-call/weekend/after hours pager

## 2020-02-16 LAB — BASIC METABOLIC PANEL
Anion gap: 26 — ABNORMAL HIGH (ref 5–15)
BUN: 51 mg/dL — ABNORMAL HIGH (ref 8–23)
CO2: 21 mmol/L — ABNORMAL LOW (ref 22–32)
Calcium: 7.3 mg/dL — ABNORMAL LOW (ref 8.9–10.3)
Chloride: 92 mmol/L — ABNORMAL LOW (ref 98–111)
Creatinine, Ser: 12.34 mg/dL — ABNORMAL HIGH (ref 0.61–1.24)
GFR, Estimated: 4 mL/min — ABNORMAL LOW (ref >60)
Glucose, Bld: 157 mg/dL — ABNORMAL HIGH (ref 70–99)
Potassium: 4.6 mmol/L (ref 3.5–5.1)
Sodium: 139 mmol/L (ref 135–145)

## 2020-02-16 LAB — CBC WITH DIFFERENTIAL/PLATELET
Abs Immature Granulocytes: 0.14 10*3/uL — ABNORMAL HIGH (ref 0.00–0.07)
Basophils Absolute: 0.1 10*3/uL (ref 0.0–0.1)
Basophils Relative: 1 %
Eosinophils Absolute: 0.3 10*3/uL (ref 0.0–0.5)
Eosinophils Relative: 3 %
HCT: 27.9 % — ABNORMAL LOW (ref 39.0–52.0)
Hemoglobin: 9.4 g/dL — ABNORMAL LOW (ref 13.0–17.0)
Immature Granulocytes: 2 %
Lymphocytes Relative: 7 %
Lymphs Abs: 0.6 10*3/uL — ABNORMAL LOW (ref 0.7–4.0)
MCH: 31.3 pg (ref 26.0–34.0)
MCHC: 33.7 g/dL (ref 30.0–36.0)
MCV: 93 fL (ref 80.0–100.0)
Monocytes Absolute: 0.9 10*3/uL (ref 0.1–1.0)
Monocytes Relative: 10 %
Neutro Abs: 7 10*3/uL (ref 1.7–7.7)
Neutrophils Relative %: 77 %
Platelets: 135 10*3/uL — ABNORMAL LOW (ref 150–400)
RBC: 3 MIL/uL — ABNORMAL LOW (ref 4.22–5.81)
RDW: 17.5 % — ABNORMAL HIGH (ref 11.5–15.5)
WBC: 8.9 10*3/uL (ref 4.0–10.5)
nRBC: 1.2 % — ABNORMAL HIGH (ref 0.0–0.2)

## 2020-02-16 LAB — GLUCOSE, CAPILLARY
Glucose-Capillary: 120 mg/dL — ABNORMAL HIGH (ref 70–99)
Glucose-Capillary: 110 mg/dL — ABNORMAL HIGH (ref 70–99)
Glucose-Capillary: 147 mg/dL — ABNORMAL HIGH (ref 70–99)
Glucose-Capillary: 114 mg/dL — ABNORMAL HIGH (ref 70–99)
Glucose-Capillary: 141 mg/dL — ABNORMAL HIGH (ref 70–99)
Glucose-Capillary: 154 mg/dL — ABNORMAL HIGH (ref 70–99)

## 2020-02-16 LAB — MAGNESIUM: Magnesium: 2.3 mg/dL (ref 1.7–2.4)

## 2020-02-16 LAB — HEMOGLOBIN A1C
Hgb A1c MFr Bld: 5.7 % — ABNORMAL HIGH (ref 4.8–5.6)
Mean Plasma Glucose: 117 mg/dL

## 2020-02-16 LAB — PHOSPHORUS: Phosphorus: 10.2 mg/dL — ABNORMAL HIGH (ref 2.5–4.6)

## 2020-02-16 MED ORDER — VASOPRESSIN 20 UNITS/100 ML INFUSION FOR SHOCK
0.0000 [IU]/min | INTRAVENOUS | Status: DC
  Administered 2020-02-16: 0.03 [IU]/min via INTRAVENOUS
  Administered 2020-02-17: 0.02 [IU]/min via INTRAVENOUS
  Administered 2020-02-17 – 2020-02-19 (×3): 0.03 [IU]/min via INTRAVENOUS
  Administered 2020-02-20: 0.02 [IU]/min via INTRAVENOUS
  Administered 2020-02-22 (×2): 0.03 [IU]/min via INTRAVENOUS
  Filled 2020-02-16 (×9): qty 100

## 2020-02-16 NOTE — Progress Notes (Signed)
[facility]  ROUNDING NOTE   Subjective:   Patient received hemodialysis yesterday, he became tachycardic towards the end of the treatment.  He appears euvolemic today.  No additional dialysis treatment required today. Patient is intubated FiO2 40% He is on Amiodarone Infusion  Norepinephrine 10 mcg per minute on flow He is sedated with fentanyl and propofol OGT draining greenish output    Objective:  Vital signs in last 24 hours:  Temp:  [97 F (36.1 C)-99.7 F (37.6 C)] 98.7 F (37.1 C) (10/16 0400) Pulse Rate:  [44-102] 77 (10/16 0600) Resp:  [16-27] 24 (10/16 0600) BP: (61-122)/(34-96) 101/87 (10/16 0600) SpO2:  [89 %-96 %] 92 % (10/16 0813) Arterial Line BP: (101-161)/(45-78) 124/55 (10/16 0600) FiO2 (%):  [40 %-45 %] 40 % (10/16 0811) Weight:  [130.5 kg] 130.5 kg (10/16 0442)  Weight change: 0.318 kg Filed Weights   02/17/2020 1200 02/15/20 0500 02/16/20 0442  Weight: 131.2 kg 136 kg 130.5 kg    Intake/Output: I/O last 3 completed shifts: In: 3910.5 [I.V.:3427.6; Blood:400; IV Piggyback:82.9] Out: 1633 [Emesis/NG output:550; Other:1083]   Intake/Output this shift:  No intake/output data recorded.  Physical Exam: General:  Appears critically ill  Head:  Normocephalic, atraumatic, +ETT, +OGT  Eyes:  Sclerae and conjunctivae clear  Lungs:  Ventilator assisted,PRVC FiO2 40%,Lungs clear  Heart: S1S2,no rubs or gallops, HR in 80's  Abdomen:  Obese,non distended  Extremities:  No  peripheral edema.  Neurologic:  sedated  Skin: No acute lesions or rashes noted  Access: PD catheter, RIJ permcath, and left phallic AVF +bruit     Basic Metabolic Panel: Recent Labs  Lab 03/01/2020 1228 03/02/2020 1228 02/15/20 0100 02/15/20 0440 02/15/20 1409 02/16/20 0444  NA 140  --  141  --  137 139  K 5.9*  --  5.1  --  5.4* 4.6  CL 85*  --  87*  --  86* 92*  CO2 23  --  22  --  21* 21*  GLUCOSE 316*  --  169*  --  143* 157*  BUN 91*  --  91*  --  90* 51*   CREATININE 19.17*  --  18.82*  --  18.60* 12.34*  CALCIUM 7.3*   < > 6.5*  --  6.4* 7.3*  MG 2.8*  --   --  2.8*  --  2.3  PHOS 15.8*  --   --  13.5* 13.5* 10.2*   < > = values in this interval not displayed.    Liver Function Tests: Recent Labs  Lab 02/08/2020 1228 02/15/20 1409  AST 17  --   ALT 14  --   ALKPHOS 73  --   BILITOT 1.0  --   PROT 7.4  --   ALBUMIN 3.2* 2.9*   No results for input(s): LIPASE, AMYLASE in the last 168 hours. No results for input(s): AMMONIA in the last 168 hours.  CBC: Recent Labs  Lab 02/26/2020 1228 02/15/20 0045 02/15/20 0440 02/15/20 1409 02/16/20 0444  WBC 6.4 6.6 6.8 6.4 8.9  NEUTROABS 5.0 5.2  --   --  7.0  HGB 6.1* 10.0* 10.9* 8.4* 9.4*  HCT 19.4* 30.5* 33.3* 24.4* 27.9*  MCV 96.0 93.3 93.3 91.0 93.0  PLT 124* 125* 116* 123* 135*    Cardiac Enzymes: Recent Labs  Lab 02/15/2020 1228  CKMB 33.0*    BNP: Invalid input(s): POCBNP  CBG: Recent Labs  Lab 02/15/20 1551 02/15/20 1935 02/15/20 2316 02/16/20 0405 02/16/20 0919    GLUCAP 115* 84 155* 120* 110*    Microbiology: Results for orders placed or performed during the hospital encounter of 02/11/2020  Culture, blood (routine x 2)     Status: None (Preliminary result)   Collection Time: 02/26/2020 12:29 PM   Specimen: BLOOD  Result Value Ref Range Status   Specimen Description BLOOD RIGHT ANTECUBITAL  Final   Special Requests   Final    BOTTLES DRAWN AEROBIC AND ANAEROBIC Blood Culture adequate volume   Culture   Final    NO GROWTH 2 DAYS Performed at [facility], [address] [place], [place] [address]    Report Status PENDING  Incomplete  Culture, blood (routine x 2)     Status: None (Preliminary result)   Collection Time: 02/18/2020 12:30 PM   Specimen: BLOOD  Result Value Ref Range Status   Specimen Description BLOOD BLOOD RIGHT HAND  Final   Special Requests   Final    BOTTLES DRAWN AEROBIC AND ANAEROBIC Blood Culture adequate volume   Culture    Final    NO GROWTH 2 DAYS Performed at [facility], [address] [place], [place] [address]    Report Status PENDING  Incomplete  MRSA PCR Screening     Status: None   Collection Time: 02/24/2020 12:48 PM   Specimen: Nasal Mucosa; Nasopharyngeal  Result Value Ref Range Status   MRSA by PCR NEGATIVE NEGATIVE Final    Comment:        The GeneXpert MRSA Assay (FDA approved for NASAL specimens only), is one component of a comprehensive MRSA colonization surveillance program. It is not intended to diagnose MRSA infection nor to guide or monitor treatment for MRSA infections. Performed at [facility], [address] [place], [place] [address]     Coagulation Studies: Recent Labs    02/19/2020 1228  LABPROT 15.2  INR 1.3*    Urinalysis: No results for input(s): COLORURINE, LABSPEC, PHURINE, GLUCOSEU, HGBUR, BILIRUBINUR, KETONESUR, PROTEINUR, UROBILINOGEN, NITRITE, LEUKOCYTESUR in the last 72 hours.  Invalid input(s): APPERANCEUR    Imaging: DG Abd 1 View  Result Date: 03/02/2020 CLINICAL DATA:  OG tube placement EXAM: ABDOMEN - 1 VIEW COMPARISON:  July 26, 2019 FINDINGS: The enteric tube projects over the gastric body. The visualized bowel gas pattern is nonspecific. IMPRESSION: The enteric tube projects over the gastric body. Electronically Signed   By: Christopher  Green M.D.   On: 02/19/2020 17:55   CT HEAD WO CONTRAST  Result Date: 02/13/2020 CLINICAL DATA:  Status post cardiac arrest EXAM: CT HEAD WITHOUT CONTRAST TECHNIQUE: Contiguous axial images were obtained from the base of the skull through the vertex without intravenous contrast. COMPARISON:  March 13, 2012 head CT and brain MRI. FINDINGS: Brain: Ventricles and sulci are normal in size and configuration. There is no intracranial mass, hemorrhage, extra-axial fluid collection, or midline shift. There is preservation of gray-white differentiation without evident cerebral edema. Brain  parenchyma appears unremarkable. No acute infarct evident. There is a mild area of deep sulcal asymmetry in the right temporal region compared to the left, stable and a presumed anatomic variant. Vascular: No hyperdense vessel. There is calcification in each carotid siphon region. Skull: The bony calvarium appears intact. Sinuses/Orbits: Visualized paranasal sinuses are clear. Orbits appear symmetric bilaterally. Other: Mastoid air cells are clear. IMPRESSION: Preservation of gray-white differentiation without edema. No acute infarct. No mass or hemorrhage. Foci of arterial vascular calcification noted. Electronically Signed   By: William  Woodruff III M.D.     On: 02/13/2020 13:03   DG Chest Port 1 View  Result Date: 02/15/2020 CLINICAL DATA:  Respiratory failure. EXAM: PORTABLE CHEST 1 VIEW COMPARISON:  Chest x-ray 02/10/2020, CT chest 11/19/2019 FINDINGS: Endotracheal tube with tip approximately 8 cm above the carina. Enteric tube courses below diaphragm with tip and side port overlying the expected region the gastric lumen. PO contrast is noted partially opacifying the gastric lumen. Right chest wall dialysis catheter again noted with tip overlying the right atrium with tip near the inferior cavoatrial junction a (tip likely more cranially within the right atrium; however, due to low lung volumes tip is near the junction). The heart size and mediastinal contours are unchanged. Low lung volumes. Vague patchy airspace opacities. Increased interstitial markings. No pleural effusion. No pneumothorax. No acute osseous abnormality. IMPRESSION: 1. Enteric tube with tip terminating 8 cm above the carina. Consider advancing by 2 cm. 2. Low lung volumes with vague patchy airspace opacities that likely represent atelectasis. 3. Cardiomegaly with mild pulmonary edema. Electronically Signed   By: Morgane  Naveau M.D.   On: 02/15/2020 02:28   DG Chest Port 1 View  Result Date: 02/17/2020 CLINICAL DATA:  Intubation  EXAM: PORTABLE CHEST 1 VIEW COMPARISON:  12/27/2019 FINDINGS: There is a well-positioned tunneled dialysis catheter on the right. The endotracheal tube terminates above the carina and just below the thoracic inlet. The tube terminates approximately 8 cm above the carina. The enteric tube appears to extend below the left hemidiaphragm. The heart size is stable but enlarged. The lung volumes are low. There is vascular congestion with small bilateral pleural effusions and bibasilar atelectasis. There is no acute osseous abnormality. IMPRESSION: 1. Lines and tubes as above. 2. Low lung volumes with bibasilar atelectasis and small bilateral pleural effusions. 3. Cardiomegaly with vascular congestion. Electronically Signed   By: Christopher  Green M.D.   On: 02/09/2020 17:54   EEG adult  Result Date: 02/03/2020 Reynolds, Leslie, MD     02/12/2020  3:29 PM ELECTROENCEPHALOGRAM REPORT Patient: Giorgi L Kallenbach       Room #: IC06A EEG No. ID: [id number] Age: 63 y.o.        Sex: male Requesting Physician: Kasa Report Date:  02/22/2020       Interpreting Physician: REYNOLDS, LESLIE History: Xzavian L Sabree is an 63 y.o. male with PEA arrest during colonoscopy Medications: Fentanyl, Propofol, Levophed, ASA, Conditions of Recording:  This is a 21 channel routine scalp EEG performed with bipolar and monopolar montages arranged in accordance to the international 10/20 system of electrode placement. One channel was dedicated to EKG recording. The patient is in the intubated and sedated state. Description:  The background activity is very low voltage and often obscured by low voltage artifact and electrocardiogram artifact.   At times some very slow polymorphic delta activity could be identified.  No epileptiform activity is noted.  The patient was stimulated during the recording with no activation of the background rhythm noted.  Hyperventilation and intermittent photic stimulation were not performed. IMPRESSION: This  electroencephalogram is characterized by very slow, low voltage background activity that is consistent with a medication effect, but can not rule out a severe diffuse cerebral disturbance as well, etiologically nonspecific.  Clinical correlation recommended.  No epileptiform activity is noted.  Leslie Reynolds, MD Neurology 02/03/2020, 2:43 PM   ECHOCARDIOGRAM COMPLETE  Result Date: 02/19/2020    ECHOCARDIOGRAM REPORT   Patient Name:   Ulysees L Muilenburg Date of Exam: 02/22/2020 Medical Rec #:  [id number]        Height:       72.0 in Accession #:    [id number]     Weight:       289.2 lb Date of Birth:  09/16/1955      BSA:          2.492 m Patient Age:    63 years       BP:           121/46 mmHg Patient Gender: M              HR:           88 bpm. Exam Location:  [facility] Procedure: 2D Echo, Cardiac Doppler and Color Doppler Indications:     Cardiac Arrest 146.9  History:         Patient has prior history of Echocardiogram examinations, most                  recent 11/16/2019. CHF.  Sonographer:     Jerry Hege RDCS (AE) Referring Phys:  [id number] BRITTON L RUST-CHESTER Diagnosing Phys: Timothy Gollan MD  Sonographer Comments: Echo performed with patient supine and on artificial respirator. IMPRESSIONS  1. Left ventricular ejection fraction, by estimation, is 50 to 55%. The left ventricle has low normal function. The left ventricle has no regional wall motion abnormalities. There is moderate left ventricular hypertrophy. Indeterminate diastolic filling  due to E-A fusion.  2. The aortic valve was not well visualized. Aortic valve regurgitation is not visualized. Mild to moderate aortic valve sclerosis/calcification is present, without any evidence of aortic stenosis. FINDINGS  Left Ventricle: Left ventricular ejection fraction, by estimation, is 50 to 55%. The left ventricle has low normal function. The left ventricle has no regional wall motion abnormalities. The left ventricular internal cavity size was normal in size.  There is moderate left ventricular hypertrophy. Indeterminate diastolic filling due to E-A fusion. Right Ventricle: The right ventricular size is normal. No increase in right ventricular wall thickness. Right ventricular systolic function is normal. Tricuspid regurgitation signal is inadequate for assessing PA pressure. Left Atrium: Left atrial size was normal in size. Right Atrium: Right atrial size was normal in size. Pericardium: There is no evidence of pericardial effusion. Mitral Valve: The mitral valve is normal in structure. Mild to moderate mitral annular calcification. No evidence of mitral valve regurgitation. No evidence of mitral valve stenosis. Tricuspid Valve: The tricuspid valve is normal in structure. Tricuspid valve regurgitation is not demonstrated. No evidence of tricuspid stenosis. Aortic Valve: The aortic valve was not well visualized. Aortic valve regurgitation is not visualized. Mild to moderate aortic valve sclerosis/calcification is present, without any evidence of aortic stenosis. Aortic valve mean gradient measures 5.5 mmHg.  Aortic valve peak gradient measures 10.2 mmHg. Aortic valve area, by VTI measures 2.12 cm. Pulmonic Valve: The pulmonic valve was normal in structure. Pulmonic valve regurgitation is not visualized. No evidence of pulmonic stenosis. Aorta: The aortic root is normal in size and structure. Venous: The inferior vena cava is normal in size with greater than 50% respiratory variability, suggesting right atrial pressure of 3 mmHg. IAS/Shunts: No atrial level shunt detected by color flow Doppler.  LEFT VENTRICLE PLAX 2D LVIDd:         4.64 cm  Diastology LVIDs:         2.94 cm  LV e' medial:    7.29 cm/s LV PW:         1.74 cm  LV E/e' medial:  12.2 LV IVS:          1.94 cm  LV e' lateral:   4.46 cm/s LVOT diam:     2.30 cm  LV E/e' lateral: 19.9 LV SV:         58 LV SV Index:   23 LVOT Area:     4.15 cm  LEFT ATRIUM             Index       RIGHT ATRIUM           Index LA  Vol (A2C):   71.8 ml 28.81 ml/m RA Area:     27.80 cm LA Vol (A4C):   47.2 ml 18.94 ml/m RA Volume:   99.70 ml  40.01 ml/m LA Biplane Vol: 61.2 ml 24.56 ml/m  AORTIC VALVE                    PULMONIC VALVE AV Area (Vmax):    2.03 cm     PV Vmax:        0.93 m/s AV Area (Vmean):   1.97 cm     PV Peak grad:   3.4 mmHg AV Area (VTI):     2.12 cm     RVOT Peak grad: 5 mmHg AV Vmax:           160.00 cm/s AV Vmean:          106.000 cm/s AV VTI:            0.272 m AV Peak Grad:      10.2 mmHg AV Mean Grad:      5.5 mmHg LVOT Vmax:         78.30 cm/s LVOT Vmean:        50.300 cm/s LVOT VTI:          0.139 m LVOT/AV VTI ratio: 0.51  AORTA Ao Root diam: 3.60 cm MITRAL VALVE               TRICUSPID VALVE MV Area (PHT): 2.48 cm    TR Peak grad:   14.4 mmHg MV Decel Time: 306 msec    TR Vmax:        190.00 cm/s MV E velocity: 88.80 cm/s MV A velocity: 47.50 cm/s  SHUNTS MV E/A ratio:  1.87        Systemic VTI:  0.14 m                            Systemic Diam: 2.30 cm Timothy Gollan MD Electronically signed by Timothy Gollan MD Signature Date/Time: 03/01/2020/4:14:37 PM    Final      Medications:   . sodium chloride    . sodium chloride 10 mL/hr at 02/15/20 0600  . amiodarone 30 mg/hr (02/16/20 0600)  . dialysis solution 1.5% low-MG/low-CA    . famotidine (PEPCID) IV Stopped (02/13/2020 1747)  . fentaNYL infusion INTRAVENOUS 200 mcg/hr (02/16/20 0600)  . norepinephrine (LEVOPHED) Adult infusion 10 mcg/min (02/16/20 0505)  . propofol (DIPRIVAN) infusion 50 mcg/kg/min (02/16/20 0844)   . sodium chloride   Intravenous Once  . chlorhexidine gluconate (MEDLINE KIT)  15 mL Mouth Rinse BID  . Chlorhexidine Gluconate Cloth  6 each Topical Daily  . docusate  100 mg Per Tube BID  . fentaNYL (SUBLIMAZE) injection  100 mcg Intravenous Once  . gentamicin cream  1 application Topical Daily  . heparin  5,000 Units Subcutaneous Q8H  . insulin aspart  0-6 Units Subcutaneous Q4H  . mouth rinse    15 mL Mouth Rinse 10  times per day  . polyethylene glycol  17 g Per Tube Daily   docusate sodium, fentaNYL, midazolam, ondansetron (ZOFRAN) IV, polyethylene glycol  Assessment/ Plan:  Mr. Cale L Ringold is a 63 y.o. white male with end stage renal disease on peritoneal dialysis, hepatic cirrhosis, congestive heart failure, COPD, atrial fibrillation, depression, GERD, sleep apnea, and gout who was admitted after cardiac arrest prior to scheduled colonoscopy on 02/07/2020. Patient currently admitted to [facility] ICU requiring intubation, mechanical ventilation and vasopressors.   # ESRD on Peritoneal dialysis:  Patient received peritoneal dialysis on 02/24/2020, however was with poor clearance  -Received hemo dialysis yesterday, became tachycardic during the treatment -Volume and electrolyte status acceptable today -No dialysis today -We will reassess him tomorrow  #Hyperkalemia Was hyperkalemic on admission Potassium down to 4.6 today  #Hypotension with cardiogenic shock - holding metoprolol and torsemide -Patient is on Norepinephrine -Blood pressure readings stays at low normal range  #Anemia with renal failure and GI bleed.  Received PRBC transfusion on 02/15/2020 Hemoglobin 9.4 today Hematology and Gastro enterology teams involved in the care Receives Epogen 40,000 unit subcutaneous weekly as outpatient, last dose was on 02/11/2020  #Secondary Hyperparathyroidism with hyperphosphatemia.  Phosphorus 10.2 Calcium 7.3 -Patient is on Renvela as outpatient    LOS: 2   10/16/202110:37 AM  

## 2020-02-16 NOTE — Progress Notes (Signed)
NAME:  Patrick Hurst, MRN:  [id number], DOB:  November 28, 1955, LOS: 2 ADMISSION DATE:  03/01/2020, CONSULTATION DATE:  02/11/2020 REFERRING MD:  Dr. Kenton Kingfisher, CHIEF COMPLAINT:  Cardiac Arrest   Brief History   64 yo male presented for outpatient colonoscopy PEA cardiac arrested during the procedure, s/p 10 minutes CPR, intubated requiring mechanical ventilation with Targeted Temperature Management admitted to the ICU.  History of present illness   64 yo male with a significant history of COPD, OSA, T2DM, ESRD, CHF, AF presented to Endoscopy for an outpatient colonoscopy procedure.  In the middle of the procedure his heart rate began to drop and he PEA arrested requiring 10 minutes of CPR prior to achieving ROSC.  He received epinephrine, atropine, calcium, sodium bicarb & D50.  He was intubated during the code blue requiring mechanical ventilation.   He had a recent admit in August 2021 for acute hypoxic respiratory failure and symptomatic anemia supported on BIPAP and completed a work up with heme/onc and GI.  This procedure was to complete this anemia work up that had been initiated during that hospitalization. The patient was unresponsive post arrest and the decision was made to activate TTM.  PCCM was consulted for further management and the patient was transferred to the ICU.   Significant Hospital Events    10/14 Code Blue in Endo procedure, intubated, admitted to ICU for TTM 02/15/20- patient evaluated at bedside. Spoke to wife at length and reviewed various testing and diagnostics as well as therapy received and short term care plan. Levophed at 28mcg/kg/min.  S/p PD therapy this am. Patient with atelectasis on CXR this am, weaning FiO2 on MV with plan for SBT in am.  Discussed care plan with renal team - Dr Juleen China considering HD.  02/16/20- patient did not pass breathing trial today, had family meeting with son and wife at bedside with RN present throughout. Plan to re-attempt SBT in AM.     Past Medical History  OSA with Bipap HFpEF Bells Palsy T2DM Proliferative diabetic retinopathy ESRD on peritoneal dialysis with HD also AFib on eliquis NASH cirrhosis     Consults:  Nephrology  Procedures:  10/14 ETT >> 10/14 Arterial line >> 10/14 CVC 3L>> Significant Diagnostic Tests:  02/18/2020 CT head >> preservation of gray-white matter differentiation without edema. No acute infarct, no mass of hemorrhage.  Micro Data:  10/14 MRSA PCR >> 10/14 BC >> 10/14 Tracheal aspirate >>  Antimicrobials:    Interim history/subjective:  Patient critically ill, intubated & sedated requiring mechanical ventilation.   Wife and son updated bedside.  Labs/ Imaging personally reviewed Na+/ K+: 140/ 5.9 BUN/Cr.: 91/ 19.17 Hgb: 6.1 Troponin: 169 ~ 175 WBC: 6.4  ABG: 7.35/ 42/ 87/ 23.2 (s/p intubation)  Objective   Blood pressure (!) 130/99, pulse 76, temperature 98.7 F (37.1 C), temperature source Axillary, resp. rate 20, height 6' (1.829 m), weight 130.5 kg, SpO2 95 %.    Vent Mode: PRVC FiO2 (%):  [40 %] 40 % Set Rate:  [20 bmp-24 bmp] 20 bmp Vt Set:  [550 mL] 550 mL PEEP:  [5 cmH20] 5 cmH20 Plateau Pressure:  [10 cmH20-26 cmH20] 17 cmH20   Intake/Output Summary (Last 24 hours) at 02/16/2020 1316 Last data filed at 02/16/2020 1100 Gross per 24 hour  Intake 2856.01 ml  Output 1233 ml  Net 1623.01 ml   Filed Weights   02/05/2020 1200 02/15/20 0500 02/16/20 0442  Weight: 131.2 kg 136 kg 130.5 kg    Examination: General:  Adult male, critically ill, lying in bed intubated & sedated requiring mechanical ventilation HEENT: atraumatic, neck supple, MM pink/moist, anicteric Neuro: unresponsive, unable to follow commands, Pupils unequal R #5/ L #2 both sluggish, myoclonus jerking activity CV: s1s2 RRR, NSR on monitor, no r/m/g Pulm: Regular, non labored on PRVC at 45% >>40%, breath sounds diminished breath sounds throughout GI: soft, rounded with PD cath in place,  bs x 4 Skin: intact, no rashes/lesions noted Extremities: warm/dry, pulses + 2 R/P, no edema noted  Resolved Hospital Problem list     Assessment & Plan:    Cardiac arrest Patient in the middle of an outpatient elective colonoscopy for colon cancer screening. Per endo team, patient became bradycardic and PEA arrested 10 minutes of CPR, concern for possible anoxic injury  Circulatory shock 10 minutes downtime, CPR initiated immediately - 36 degree TTM initiated per protocol - CT head obtained - CVC & arterial line placed - Continue levophed PRN, goal MAP > 65 - Echo ordered - Trend troponin, lactate  Acute hypoxic respiratory failure  PMHx: COPD, OSA on bipap In the setting of PEA cardiac arrest, patient intubated during CODE BLUE event - Ventilator settings: PRVC at 45%, PEEP 5, 32mL/kg  - Wean PEEP and FiO2 for sats greater than 90%. - Follow intermittent chest x-ray and ABG.   - daily WUA & SBT once TTM completed - VAP bundle in place  - PAD protocol: propofol & fentanyl  Anoxic encephalopathy 10 minutes of CPR prior to achieving ROSC - Sedation  Propofol & fentanyl, will utilize NMB PRN for shivering - Hold daily WUA & SBT until TTM completed - Neuro consult once rewarmed as needed  Hyperkalemia - K+ 5.9  PMHx: ESRD Patient on PD and HD PRN outpatient - Unable to give veltassa via OGT, lokelma ordered QD - Strict I/O's: alert provider if UOP < 0.5 mL/kg/hr - Q 12 BMP, replace electrolytes PRN - Avoid nephrotoxic agents as able, ensure adequate renal perfusion - Nephrology following, appreciate input  Anemia Hbg: 6.1 - 2 units PRBC's ordered for transfusion, recheck H&H per protocol - Monitor for s/s of bleeding - Daily CBC - Transfuse for Hgb <7  Atrial Fibrillation Patient currently in NSR on monitor - home medications: amiodarone & eliquis on hold - consider restarting amiodarone 10/15 via OGT  At risk for multiple metabolic derangements during  cooling. - NS @ 10 mL (ESRD patient) - Correct electrolytes as indicated. - BMP q12hrs per TTM protocol     Best practice:  Diet: NPO Pain/Anxiety/Delirium protocol (if indicated): propofol & fentanyl VAP protocol (if indicated): initiated DVT prophylaxis: heparin SQ GI prophylaxis: pepcid Glucose control: Q 4 CBG, target range 140-180 Mobility: bedrest Code Status: FULL Family Communication: Updated wife & son bedside Disposition: ICU  Labs   CBC: Recent Labs  Lab 02/20/2020 1228 02/15/20 0045 02/15/20 0440 02/15/20 1409 02/16/20 0444  WBC 6.4 6.6 6.8 6.4 8.9  NEUTROABS 5.0 5.2  --   --  7.0  HGB 6.1* 10.0* 10.9* 8.4* 9.4*  HCT 19.4* 30.5* 33.3* 24.4* 27.9*  MCV 96.0 93.3 93.3 91.0 93.0  PLT 124* 125* 116* 123* 135*    Basic Metabolic Panel: Recent Labs  Lab 02/20/2020 1228 02/15/20 0100 02/15/20 0440 02/15/20 1409 02/16/20 0444  NA 140 141  --  137 139  K 5.9* 5.1  --  5.4* 4.6  CL 85* 87*  --  86* 92*  CO2 23 22  --  21* 21*  GLUCOSE 316* 169*  --  143* 157*  BUN 91* 91*  --  90* 51*  CREATININE 19.17* 18.82*  --  18.60* 12.34*  CALCIUM 7.3* 6.5*  --  6.4* 7.3*  MG 2.8*  --  2.8*  --  2.3  PHOS 15.8*  --  13.5* 13.5* 10.2*   GFR: Estimated Creatinine Clearance: 8.7 mL/min (A) (by C-G formula based on SCr of 12.34 mg/dL (H)). Recent Labs  Lab 02/16/2020 1228 02/02/2020 1228 02/28/2020 1459 02/15/20 0045 02/15/20 0440 02/15/20 1409 02/16/20 0444  PROCALCITON 0.87  --   --   --   --   --   --   WBC 6.4   < >  --  6.6 6.8 6.4 8.9  LATICACIDVEN 3.0*  --  1.7  --   --   --   --    < > = values in this interval not displayed.    Liver Function Tests: Recent Labs  Lab 02/28/2020 1228 02/15/20 1409  AST 17  --   ALT 14  --   ALKPHOS 73  --   BILITOT 1.0  --   PROT 7.4  --   ALBUMIN 3.2* 2.9*   No results for input(s): LIPASE, AMYLASE in the last 168 hours. No results for input(s): AMMONIA in the last 168 hours.  ABG    Component Value Date/Time    PHART 7.34 (L) 02/15/2020 0424   PCO2ART 41 02/15/2020 0424   PO2ART 85 02/15/2020 0424   HCO3 22.1 02/15/2020 0424   TCO2 22 11/08/2019 1118   ACIDBASEDEF 3.4 (H) 02/15/2020 0424   O2SAT 95.8 02/15/2020 0424     Coagulation Profile: Recent Labs  Lab 02/28/2020 1228  INR 1.3*    Cardiac Enzymes: Recent Labs  Lab 02/07/2020 1228  CKMB 33.0*    HbA1C: Hemoglobin A1C  Date/Time Value Ref Range Status  11/12/2019 08:43 AM 5.8 (A) 4.0 - 5.6 % Final  07/11/2019 08:22 AM 5.6 4.0 - 5.6 % Final  03/14/2012 03:21 AM 5.0 4.2 - 6.3 % Final    Comment:    The American Diabetes Association recommends that a primary goal of therapy should be <7% and that physicians should reevaluate the treatment regimen in patients with HbA1c values consistently >8%.    Hgb A1c MFr Bld  Date/Time Value Ref Range Status  02/04/2020 12:28 PM 5.7 (H) 4.8 - 5.6 % Final    Comment:    (NOTE)         Prediabetes: 5.7 - 6.4         Diabetes: >6.4         Glycemic control for adults with diabetes: <7.0   03/02/2019 12:17 AM 6.1 (H) 4.8 - 5.6 % Final    Comment:    (NOTE) Pre diabetes:          5.7%-6.4% Diabetes:              >6.4% Glycemic control for   <7.0% adults with diabetes     CBG: Recent Labs  Lab 02/15/20 1935 02/15/20 2316 02/16/20 0405 02/16/20 0919 02/16/20 1126  GLUCAP 84 155* 120* 110* 147*    Review of Systems:   UTA, patient s/p cardiac arrest, unresponsive- intubated requiring mechanical ventilation.  Past Medical History  He,  has a past medical history of Acute on chronic diastolic CHF (congestive heart failure) ([place]) (06/21/2018), Acute respiratory failure with hypoxia ([place]) (06/21/2018), Bell palsy (02/12/2015), Carpal tunnel syndrome (02/12/2015), Dependence on nocturnal oxygen therapy, Depression, Gastric ulcer, GERD (gastroesophageal reflux  disease), Gout, History of chicken pox, Multifocal pneumonia (03/04/2019), and Sleep apnea treated with nocturnal BiPAP.    Surgical History    Past Surgical History:  Procedure Laterality Date  . AV FISTULA PLACEMENT Left 11/08/2019   Procedure: ARTERIOVENOUS (AV) FISTULA CREATION (RADIALCEPHALIC);  Surgeon: Algernon Huxley, MD;  Location: [facility] ORS;  Service: Vascular;  Laterality: Left;  . CATARACT EXTRACTION Bilateral 2014 and 2015  . CHOLECYSTECTOMY  04/28/2011   Laproscopic; Dr. Pat Patrick  . COLONOSCOPY WITH PROPOFOL N/A 12/30/2019   Procedure: COLONOSCOPY WITH PROPOFOL;  Surgeon: Lin Landsman, MD;  Location: [facility] ENDOSCOPY;  Service: Gastroenterology;  Laterality: N/A;  . DIALYSIS/PERMA CATHETER INSERTION N/A 05/24/2019   Procedure: DIALYSIS/PERMA CATHETER INSERTION;  Surgeon: Algernon Huxley, MD;  Location: [place] CV LAB;  Service: Cardiovascular;  Laterality: N/A;  . DIALYSIS/PERMA CATHETER INSERTION N/A 10/09/2019   Procedure: DIALYSIS/PERMA CATHETER INSERTION;  Surgeon: Katha Cabal, MD;  Location: [place] CV LAB;  Service: Cardiovascular;  Laterality: N/A;  . ESOPHAGOGASTRODUODENOSCOPY (EGD) WITH PROPOFOL N/A 12/29/2019   Procedure: ESOPHAGOGASTRODUODENOSCOPY (EGD) WITH PROPOFOL;  Surgeon: Lin Landsman, MD;  Location: [facility] ENDOSCOPY;  Service: Gastroenterology;  Laterality: N/A;  . EYE SURGERY    . GALLBLADDER SURGERY    . LASIK Bilateral 2019   medical  . SHOULDER SURGERY Right 2012   Dr. Leanor Kail     Social History   reports that he quit smoking about 41 years ago. His smoking use included cigarettes. He has a 15.00 pack-year smoking history. He has never used smokeless tobacco. He reports that he does not drink alcohol and does not use drugs.   Family History   His family history includes COPD in his mother; Cancer in his mother; Heart disease in his father.   Allergies Allergies  Allergen Reactions  . Mucinex [Guaifenesin Er] Swelling    Throat swelling, increases heart rate  . Levaquin [Levofloxacin] Palpitations     Home Medications  Prior to Admission  medications   Medication Sig Start Date End Date Taking? Authorizing Provider  amiodarone (PACERONE) 200 MG tablet Take 200 mg by mouth daily. 08/16/19  Yes [provider]  amitriptyline (ELAVIL) 25 MG tablet TAKE 1 TABLET AT BEDTIME Patient taking differently: Take 25 mg by mouth at bedtime.  07/26/19  Yes [name], MD  atorvastatin (LIPITOR) 40 MG tablet Take 1 tablet (40 mg total) by mouth daily. 01/13/20  Yes [name], MD  Budeson-Glycopyrrol-Formoterol (BREZTRI AEROSPHERE) 160-9-4.8 MCG/ACT AERO Inhale 2 puffs into the lungs in the morning and at bedtime.    Yes [provider]  calcitRIOL (ROCALTROL) 0.5 MCG capsule Take 0.5 mcg by mouth daily. 12/29/18  Yes [provider]  FLOMAX 0.4 MG CAPS capsule Take 0.4 mg by mouth daily. 01/02/20  Yes [provider]  insulin regular human CONCENTRATED (HUMULIN R) 500 UNIT/ML injection Inject up to 60 units twice a day as directed by physician 02/11/20  Yes [name], MD  Ipratropium-Albuterol (COMBIVENT RESPIMAT) 20-100 MCG/ACT AERS respimat Inhale 2 puffs into the lungs every 4 (four) hours as needed for wheezing.    Yes [provider]  lactulose (CHRONULAC) 10 GM/15ML solution Take 30 mLs by mouth daily. 09/10/19  Yes [provider]  levothyroxine (SYNTHROID) 100 MCG tablet TAKE 1 TABLET DAILY Patient taking differently: Take 100 mcg by mouth daily before breakfast.  08/01/19  Yes [name], MD  metoprolol tartrate (LOPRESSOR) 50 MG tablet Take 0.5 tablets (25  mg total) by mouth 2 (two) times daily. 01/01/20  Yes Shawna Clamp, MD  pantoprazole (PROTONIX) 40 MG tablet TAKE 1 TABLET TWICE A DAY 02/05/20  Yes [name], MD  sevelamer carbonate (RENVELA) 800 MG tablet Take by mouth. 12/27/19  Yes [provider]  torsemide (DEMADEX) 10 MG tablet Take 40 mg by mouth 2 (two) times daily.  08/13/19  Yes [provider]  ULORIC 80 MG TABS Take 80 mg by  mouth daily.  05/18/17  Yes [provider]  Ascorbic Acid (VITAMIN C) 1000 MG tablet Take 1,000 mg by mouth 2 (two) times daily.     [provider]  BD INSULIN SYRINGE U/F 31G X 5/16" 1 ML MISC  11/18/19   [provider]  cetirizine (ZYRTEC) 10 MG tablet Take 10 mg by mouth daily as needed for allergies.     [provider]  ELIQUIS 2.5 MG TABS tablet Take 2.5 mg by mouth 2 (two) times daily.  03/23/19   [provider]  gentamicin cream (GARAMYCIN) 0.1 % Apply 1 application topically daily. 09/22/19   [provider]  Spacer/Aero-Holding Chambers (AEROCHAMBER PLUS WITH MASK) inhaler See admin instructions. 12/05/19   [provider]  Spacer/Aero-Holding Josiah Lobo (EASIVENT) inhaler See admin instructions. 12/05/19 12/04/20  [provider]      Critical care provider statement:    Critical care time (minutes):  34   Critical care time was exclusive of:  Separately billable procedures and  treating other patients   Critical care was necessary to treat or prevent imminent or  life-threatening deterioration of the following conditions:  acute hypoxemic respiratory failure, cardiac arrest, AKI/CKD, COPD, OSA, obesity, DM, CHF, multiple comorbid conditions.    Critical care was time spent personally by me on the following  activities:  Development of treatment plan with patient or surrogate,  discussions with consultants, evaluation of patient's response to  treatment, examination of patient, obtaining history from patient or  surrogate, ordering and performing treatments and interventions, ordering  and review of laboratory studies and re-evaluation of patient's condition   I assumed direction of critical care for this patient from another  provider in my specialty: no       Ottie Glazier, M.D.  Pulmonary & [place]

## 2020-02-17 ENCOUNTER — Inpatient Hospital Stay: Payer: Commercial Managed Care - PPO

## 2020-02-17 ENCOUNTER — Encounter: Payer: Self-pay | Admitting: Internal Medicine

## 2020-02-17 LAB — CBC WITH DIFFERENTIAL/PLATELET
Abs Immature Granulocytes: 0.11 10*3/uL — ABNORMAL HIGH (ref 0.00–0.07)
Basophils Absolute: 0.1 10*3/uL (ref 0.0–0.1)
Basophils Relative: 1 %
Eosinophils Absolute: 0.2 10*3/uL (ref 0.0–0.5)
Eosinophils Relative: 3 %
HCT: 22 % — ABNORMAL LOW (ref 39.0–52.0)
Hemoglobin: 8 g/dL — ABNORMAL LOW (ref 13.0–17.0)
Immature Granulocytes: 2 %
Lymphocytes Relative: 11 %
Lymphs Abs: 0.8 10*3/uL (ref 0.7–4.0)
MCH: 33.8 pg (ref 26.0–34.0)
MCHC: 36.4 g/dL — ABNORMAL HIGH (ref 30.0–36.0)
MCV: 92.8 fL (ref 80.0–100.0)
Monocytes Absolute: 0.8 10*3/uL (ref 0.1–1.0)
Monocytes Relative: 11 %
Neutro Abs: 4.9 10*3/uL (ref 1.7–7.7)
Neutrophils Relative %: 72 %
Platelets: 92 10*3/uL — ABNORMAL LOW (ref 150–400)
RBC: 2.37 MIL/uL — ABNORMAL LOW (ref 4.22–5.81)
RDW: 16.6 % — ABNORMAL HIGH (ref 11.5–15.5)
WBC: 6.9 10*3/uL (ref 4.0–10.5)
nRBC: 1.2 % — ABNORMAL HIGH (ref 0.0–0.2)

## 2020-02-17 LAB — GLUCOSE, CAPILLARY
Glucose-Capillary: 154 mg/dL — ABNORMAL HIGH (ref 70–99)
Glucose-Capillary: 153 mg/dL — ABNORMAL HIGH (ref 70–99)
Glucose-Capillary: 146 mg/dL — ABNORMAL HIGH (ref 70–99)
Glucose-Capillary: 157 mg/dL — ABNORMAL HIGH (ref 70–99)
Glucose-Capillary: 172 mg/dL — ABNORMAL HIGH (ref 70–99)
Glucose-Capillary: 156 mg/dL — ABNORMAL HIGH (ref 70–99)

## 2020-02-17 LAB — MAGNESIUM: Magnesium: 2 mg/dL (ref 1.7–2.4)

## 2020-02-17 LAB — BASIC METABOLIC PANEL
Anion gap: 26 — ABNORMAL HIGH (ref 5–15)
BUN: 61 mg/dL — ABNORMAL HIGH (ref 8–23)
CO2: 20 mmol/L — ABNORMAL LOW (ref 22–32)
Calcium: 6.7 mg/dL — ABNORMAL LOW (ref 8.9–10.3)
Chloride: 90 mmol/L — ABNORMAL LOW (ref 98–111)
Creatinine, Ser: 13.95 mg/dL — ABNORMAL HIGH (ref 0.61–1.24)
GFR, Estimated: 3 mL/min — ABNORMAL LOW (ref >60)
Glucose, Bld: 178 mg/dL — ABNORMAL HIGH (ref 70–99)
Potassium: 4.9 mmol/L (ref 3.5–5.1)
Sodium: 136 mmol/L (ref 135–145)

## 2020-02-17 LAB — PHOSPHORUS: Phosphorus: 10 mg/dL — ABNORMAL HIGH (ref 2.5–4.6)

## 2020-02-17 MED ORDER — DEXMEDETOMIDINE HCL IN NACL 400 MCG/100ML IV SOLN
0.4000 ug/kg/h | INTRAVENOUS | Status: DC
  Administered 2020-02-17: 0.4 ug/kg/h via INTRAVENOUS
  Administered 2020-02-18 (×7): 1 ug/kg/h via INTRAVENOUS
  Administered 2020-02-19: 1.2 ug/kg/h via INTRAVENOUS
  Administered 2020-02-19 – 2020-02-21 (×17): 1 ug/kg/h via INTRAVENOUS
  Administered 2020-02-21: 0.8 ug/kg/h via INTRAVENOUS
  Administered 2020-02-21 (×2): 1 ug/kg/h via INTRAVENOUS
  Administered 2020-02-21: 1.2 ug/kg/h via INTRAVENOUS
  Administered 2020-02-21 (×2): 1 ug/kg/h via INTRAVENOUS
  Administered 2020-02-22: 1.2 ug/kg/h via INTRAVENOUS
  Administered 2020-02-22: 1 ug/kg/h via INTRAVENOUS
  Administered 2020-02-22 – 2020-02-23 (×7): 0.8 ug/kg/h via INTRAVENOUS
  Filled 2020-02-17 (×41): qty 100

## 2020-02-17 MED ORDER — DELFLEX-LC/1.5% DEXTROSE 344 MOSM/L IP SOLN
INTRAPERITONEAL | Status: DC
  Filled 2020-02-17 (×2): qty 3000

## 2020-02-17 MED ORDER — CALCIUM GLUCONATE-NACL 1-0.675 GM/50ML-% IV SOLN
1.0000 g | Freq: Once | INTRAVENOUS | Status: AC
  Administered 2020-02-17: 1000 mg via INTRAVENOUS
  Filled 2020-02-17: qty 50

## 2020-02-17 MED ORDER — METOPROLOL TARTRATE 5 MG/5ML IV SOLN
5.0000 mg | INTRAVENOUS | Status: AC
  Administered 2020-02-17: 5 mg via INTRAVENOUS
  Filled 2020-02-17: qty 5

## 2020-02-17 MED ORDER — AMIODARONE HCL 200 MG PO TABS
200.0000 mg | ORAL_TABLET | Freq: Two times a day (BID) | ORAL | Status: DC
  Administered 2020-02-17 – 2020-02-23 (×13): 200 mg
  Filled 2020-02-17 (×13): qty 1

## 2020-02-17 NOTE — Progress Notes (Addendum)
[facility]  ROUNDING NOTE   Subjective:   Patient still appears critically ill, family at bedside, answered their questions. He stays intubated with FiO2 50% He is on Amiodarone Infusion 60 mg/h Vasopressin 0.03 units/min He is sedated with fentanyl and propofol OGT draining yellowish green output    Objective:  Vital signs in last 24 hours:  Temp:  [98.7 F (37.1 C)-99.9 F (37.7 C)] 99.7 F (37.6 C) (10/17 0400) Pulse Rate:  [40-131] 88 (10/17 0400) Resp:  [20] 20 (10/17 0400) BP: (79-130)/(31-99) 89/47 (10/17 0300) SpO2:  [92 %-95 %] 94 % (10/17 0400) Arterial Line BP: (110-167)/(51-69) 167/69 (10/17 0400) FiO2 (%):  [40 %-50 %] 50 % (10/17 0822) Weight:  [132 kg] 132 kg (10/17 0417)  Weight change: 1.5 kg Filed Weights   02/15/20 0500 02/16/20 0442 02/17/20 0417  Weight: 136 kg 130.5 kg 132 kg    Intake/Output: I/O last 3 completed shifts: In: 3798.6 [I.V.:3748.6; IV Piggyback:50] Out: 700 [Emesis/NG output:700]   Intake/Output this shift:  No intake/output data recorded.  Physical Exam: General:  Stays critically ill  Head:   +ETT, +OGT  Eyes:  Sclerae and conjunctivae clear  Lungs:   Lungs clear, diminished at the bases,ventilator assisted,PRVC FiO2 50%  Heart:  Regular rate and rhythm, S1S2, no rubs or gallops  Abdomen:   Soft,non distended  Extremities:  Trace peripheral edema.  Neurologic:  sedated  Skin: No acute lesions or rashes noted  Access: PD catheter, RIJ permcath, and left phallic AVF +bruit     Basic Metabolic Panel: Recent Labs  Lab 02/21/2020 1228 02/20/2020 1228 02/15/20 0100 02/15/20 0100 02/15/20 0440 02/15/20 1409 02/16/20 0444 02/17/20 0415  NA 140  --  141  --   --  137 139 136  K 5.9*  --  5.1  --   --  5.4* 4.6 4.9  CL 85*  --  87*  --   --  86* 92* 90*  CO2 23  --  22  --   --  21* 21* 20*  GLUCOSE 316*  --  169*  --   --  143* 157* 178*  BUN 91*  --  91*  --   --  90* 51* 61*  CREATININE 19.17*  --   18.82*  --   --  18.60* 12.34* 13.95*  CALCIUM 7.3*   < > 6.5*   < >  --  6.4* 7.3* 6.7*  MG 2.8*  --   --   --  2.8*  --  2.3 2.0  PHOS 15.8*  --   --   --  13.5* 13.5* 10.2* 10.0*   < > = values in this interval not displayed.    Liver Function Tests: Recent Labs  Lab 02/07/2020 1228 02/15/20 1409  AST 17  --   ALT 14  --   ALKPHOS 73  --   BILITOT 1.0  --   PROT 7.4  --   ALBUMIN 3.2* 2.9*   No results for input(s): LIPASE, AMYLASE in the last 168 hours. No results for input(s): AMMONIA in the last 168 hours.  CBC: Recent Labs  Lab 02/09/2020 1228 02/16/2020 1228 02/15/20 0045 02/15/20 0440 02/15/20 1409 02/16/20 0444 02/17/20 0415  WBC 6.4   < > 6.6 6.8 6.4 8.9 6.9  NEUTROABS 5.0  --  5.2  --   --  7.0 4.9  HGB 6.1*   < > 10.0* 10.9* 8.4* 9.4* 8.0*  HCT 19.4*   < >  30.5* 33.3* 24.4* 27.9* 22.0*  MCV 96.0   < > 93.3 93.3 91.0 93.0 92.8  PLT 124*   < > 125* 116* 123* 135* 92*   < > = values in this interval not displayed.    Cardiac Enzymes: Recent Labs  Lab 02/29/2020 1228  CKMB 33.0*    BNP: Invalid input(s): POCBNP  CBG: Recent Labs  Lab 02/16/20 1809 02/16/20 2000 02/16/20 2306 02/17/20 0348 02/17/20 0723  GLUCAP 114* 141* 154* 154* 153*    Microbiology: Results for orders placed or performed during the hospital encounter of 02/15/2020  Culture, blood (routine x 2)     Status: None (Preliminary result)   Collection Time: 02/19/2020 12:29 PM   Specimen: BLOOD  Result Value Ref Range Status   Specimen Description BLOOD RIGHT ANTECUBITAL  Final   Special Requests   Final    BOTTLES DRAWN AEROBIC AND ANAEROBIC Blood Culture adequate volume   Culture   Final    NO GROWTH 3 DAYS Performed at [facility], [address] [place], [place] [address]    Report Status PENDING  Incomplete  Culture, blood (routine x 2)     Status: None (Preliminary result)   Collection Time: 02/10/2020 12:30 PM   Specimen: BLOOD  Result Value Ref Range Status    Specimen Description BLOOD BLOOD RIGHT HAND  Final   Special Requests   Final    BOTTLES DRAWN AEROBIC AND ANAEROBIC Blood Culture adequate volume   Culture   Final    NO GROWTH 3 DAYS Performed at [facility], [address] [place], [place] [address]    Report Status PENDING  Incomplete  MRSA PCR Screening     Status: None   Collection Time: 02/11/2020 12:48 PM   Specimen: Nasal Mucosa; Nasopharyngeal  Result Value Ref Range Status   MRSA by PCR NEGATIVE NEGATIVE Final    Comment:        The GeneXpert MRSA Assay (FDA approved for NASAL specimens only), is one component of a comprehensive MRSA colonization surveillance program. It is not intended to diagnose MRSA infection nor to guide or monitor treatment for MRSA infections. Performed at [facility], [address] [place], [place] [address]     Coagulation Studies: Recent Labs    03/02/2020 1228  LABPROT 15.2  INR 1.3*    Urinalysis: No results for input(s): COLORURINE, LABSPEC, PHURINE, GLUCOSEU, HGBUR, BILIRUBINUR, KETONESUR, PROTEINUR, UROBILINOGEN, NITRITE, LEUKOCYTESUR in the last 72 hours.  Invalid input(s): APPERANCEUR    Imaging: DG Chest Port 1 View  Result Date: 02/17/2020 CLINICAL DATA:  Respiratory failure EXAM: PORTABLE CHEST 1 VIEW COMPARISON:  02/15/2020 FINDINGS: Unchanged support apparatus. Shallow lung inflation without focal airspace consolidation. Mild cardiomegaly. IMPRESSION: Shallow lung inflation without focal airspace consolidation. Electronically Signed   By: Ulyses Jarred M.D.   On: 02/17/2020 06:21     Medications:   . sodium chloride    . sodium chloride 10 mL/hr at 02/15/20 0600  . dialysis solution 1.5% low-MG/low-CA    . famotidine (PEPCID) IV Stopped (02/16/20 1845)  . fentaNYL infusion INTRAVENOUS 200 mcg/hr (02/17/20 0500)  . norepinephrine (LEVOPHED) Adult infusion 6 mcg/min (02/17/20 0500)  . propofol (DIPRIVAN) infusion 30 mcg/kg/min (02/17/20 0707)   . vasopressin 0.03 Units/min (02/17/20 0500)   . sodium chloride   Intravenous Once  . amiodarone  200 mg Per Tube BID  . chlorhexidine gluconate (MEDLINE KIT)  15 mL Mouth Rinse BID  . Chlorhexidine Gluconate Cloth  6 each Topical Daily  . docusate  100 mg Per Tube BID  . fentaNYL (SUBLIMAZE) injection  100 mcg Intravenous Once  . gentamicin cream  1 application Topical Daily  . heparin  5,000 Units Subcutaneous Q8H  . insulin aspart  0-6 Units Subcutaneous Q4H  . mouth rinse  15 mL Mouth Rinse 10 times per day  . polyethylene glycol  17 g Per Tube Daily   docusate sodium, fentaNYL, midazolam, ondansetron (ZOFRAN) IV, polyethylene glycol  Assessment/ Plan:  Patrick Hurst is a 64 y.o. white male with end stage renal disease on peritoneal dialysis, hepatic cirrhosis, congestive heart failure, COPD, atrial fibrillation, depression, GERD, sleep apnea, and gout who was admitted after cardiac arrest prior to scheduled colonoscopy on 02/22/2020. Patient currently admitted to [facility] ICU requiring intubation, mechanical ventilation and vasopressors.   # ESRD on Peritoneal dialysis:  Patient received peritoneal dialysis on 02/21/2020, however was with poor clearance  -Planning for peritoneal dialysis tonight;  - 1.5% dialysate Solution -We will reassess him tomorrow  #Hyperkalemia Potassium 4.9 today  #Hypotension with cardiogenic shock - holding metoprolol and torsemide -Patient is on Vasopressin -Normotensive today  #Anemia with renal failure and GI bleed.  Received PRBC transfusion on 02/28/2020 Hemoglobin 8.0 Hematology and Gastro enterology teams involved in the care Receives Epogen 40,000 unit subcutaneous weekly as outpatient, last dose was on 02/11/2020 We will continue monitoring closely  #Secondary Hyperparathyroidism with hyperphosphatemia.  Phosphorus 10.0 on 02/17/2020 Calcium 6.7 today  # Acute respiratory failure Ventilator Dependent  Patient was seen and  evaluated with Crosby Oyster, NP. She assisted with transcription of this note.   LOS: 3 Patrick Hurst 10/17/20219:42 AM

## 2020-02-17 NOTE — Progress Notes (Signed)
Pharmacy Electrolyte Monitoring Consult:  Pharmacy consulted to assist in monitoring and replacing electrolytes in this 64 y.o. male admitted on 02/06/2020 with No chief complaint on file.   Labs:  Sodium (mmol/L)  Date Value  02/17/2020 136  11/29/2019 140  05/04/2013 137   Potassium (mmol/L)  Date Value  02/17/2020 4.9  05/04/2013 4.4   Magnesium (mg/dL)  Date Value  02/17/2020 2.0  03/14/2012 1.9   Phosphorus (mg/dL)  Date Value  02/17/2020 10.0 (H)   Calcium (mg/dL)  Date Value  02/17/2020 6.7 (L)   Calcium, Total (mg/dL)  Date Value  05/04/2013 9.1   Albumin (g/dL)  Date Value  02/15/2020 2.9 (L)  11/29/2019 3.8  05/04/2013 3.3 (L)    Assessment/Plan: 64 yo M with cardiac arrest during colonoscopy. Peritoneal Dialysis pt but received Hemodialysis 10/16. Nephrology evaluating daily for HD.  Hyperphosphatemia/hypocalcemia. Pt on renvela PTA Calcium gluconate 1 gram IV x 1 ordered this am Ca 6.7 Albumin (10/15) 2.9   Albumin Corrected Ca 7.6 Phos elevated at 10.0 Nephrology following. No other supplementation at this time. F/u labs in am    Linard Daft A 02/17/2020 12:52 PM

## 2020-02-17 NOTE — Progress Notes (Signed)
NAME:  Patrick Hurst, MRN:  [id number], DOB:  09-24-1955, LOS: 3 ADMISSION DATE:  02/28/2020, CONSULTATION DATE:  02/28/2020 REFERRING MD:  Dr. Kenton Kingfisher, CHIEF COMPLAINT:  Cardiac Arrest   Brief History   64 yo male presented for outpatient colonoscopy PEA cardiac arrested during the procedure, s/p 10 minutes CPR, intubated requiring mechanical ventilation with Targeted Temperature Management admitted to the ICU.  History of present illness   64 yo male with a significant history of COPD, OSA, T2DM, ESRD, CHF, AF presented to Endoscopy for an outpatient colonoscopy procedure.  In the middle of the procedure his heart rate began to drop and he PEA arrested requiring 10 minutes of CPR prior to achieving ROSC.  He received epinephrine, atropine, calcium, sodium bicarb & D50.  He was intubated during the code blue requiring mechanical ventilation.   He had a recent admit in August 2021 for acute hypoxic respiratory failure and symptomatic anemia supported on BIPAP and completed a work up with heme/onc and GI.  This procedure was to complete this anemia work up that had been initiated during that hospitalization. The patient was unresponsive post arrest and the decision was made to activate TTM.  PCCM was consulted for further management and the patient was transferred to the ICU.   Significant Hospital Events    10/14 Code Blue in Endo procedure, intubated, admitted to ICU for TTM 02/15/20- patient evaluated at bedside. Spoke to wife at length and reviewed various testing and diagnostics as well as therapy received and short term care plan. Levophed at 45mg/kg/min.  S/p PD therapy this am. Patient with atelectasis on CXR this am, weaning FiO2 on MV with plan for SBT in am.  Discussed care plan with renal team - Dr KJuleen Chinaconsidering HD.  02/16/20- patient did not pass breathing trial today, had family meeting with son and wife at bedside with RN present throughout. Plan to re-attempt SBT in AM.   02/17/20- patient had increased O2 requirement overnight and is more edematous with anasarca on exam this morning.  I met with family and explained patient with numerous serious comorbid conditions and overall very poor prognosis long term.  He was seen by cardiology and nephrology today.  Awakening trial with no appreciable improvement in GCS, today plan to repeat SBT if FiO2 can be weaned to <40% and perform MRI brain w/wo to evaluate for ischemic changes post PEA arrest.   Past Medical History  OSA with Bipap HFpEF Bells Palsy T2DM Proliferative diabetic retinopathy ESRD on peritoneal dialysis with HD also AFib on eliquis NASH cirrhosis     Consults:  Nephrology  Procedures:  10/14 ETT >> 10/14 Arterial line >> 10/14 CVC 3L>> Significant Diagnostic Tests:  03/02/2020 CT head >> preservation of gray-white matter differentiation without edema. No acute infarct, no mass of hemorrhage.  Objective   Blood pressure (!) 89/47, pulse 88, temperature 99.7 F (37.6 C), temperature source Oral, resp. rate 20, height 6' (1.829 m), weight 132 kg, SpO2 94 %.    Vent Mode: PRVC FiO2 (%):  [40 %-50 %] 50 % Set Rate:  [20 bmp] 20 bmp Vt Set:  [550 mL] 550 mL PEEP:  [5 cmH20] 5 cmH20 Plateau Pressure:  [16 cmH20-17 cmH20] 17 cmH20   Intake/Output Summary (Last 24 hours) at 02/17/2020 1041 Last data filed at 02/17/2020 0500 Gross per 24 hour  Intake 2052.84 ml  Output 700 ml  Net 1352.84 ml   Filed Weights   02/15/20 0500 02/16/20 0442 02/17/20 0417  Weight: 136 kg 130.5 kg 132 kg    Examination: General: Adult male, critically ill, lying in bed intubated & sedated requiring mechanical ventilation HEENT: atraumatic, neck supple, MM pink/moist, anicteric Neuro: unresponsive, unable to follow commands, Pupils unequal R #5/ L #2 both sluggish, myoclonus jerking activity CV: s1s2 RRR, NSR on monitor, no r/m/g Pulm: Regular, non labored on PRVC at 45% >>40%-10/16>>60%10/17, breath  sounds diminished  GI: soft, rounded with PD cath in place, hypoactive bsX4 Skin: intact, no rashes/lesions noted Extremities: warm/dry, pulses + 1 R/P,LE edema +  Resolved Hospital Problem list     Assessment & Plan:    Cardiac arrest Patient in the middle of an outpatient elective colonoscopy for colon cancer screening. Per endo team, patient became bradycardic and PEA arrested 10 minutes of CPR, concern for possible anoxic injury  Circulatory shock 10 minutes downtime, CPR initiated immediately - 36 degree TTM initiated per protocol-completed - CT head obtained-ICH negative g/w diff preserved, 10/17 MRI brain  - CVC & arterial line placed - Continue levophed PRN, goal MAP > 65 - Echo ordered - Trend troponin, lactate   Acute hypoxic respiratory failure  PMHx: COPD, OSA on bipap In the setting of PEA cardiac arrest, patient intubated during CODE BLUE event - Wean PEEP and FiO2 for sats greater than 90%. - Follow intermittent chest x-ray and ABG.   - daily WUA & SBT - VAP bundle in place  - PAD protocol: propofol & fentanyl-goal rass 0   Anoxic encephalopathy 10 minutes of CPR prior to achieving ROSC - Sedation  Propofol & fentanyl - Hold daily WUA & SBT until TTM completed - Neuro consult once rewarmed as needed  Hyperkalemia - K+ 5.9  PMHx: ESRD Patient on PD and HD PRN outpatient - Unable to give veltassa via OGT, lokelma ordered QD - Strict I/O's: alert provider if UOP < 0.5 mL/kg/hr - Q 12 BMP, replace electrolytes PRN - Avoid nephrotoxic agents as able, ensure adequate renal perfusion - Nephrology following, appreciate input  Anemia Hbg: 6.1 - 2 units PRBC's ordered for transfusion, recheck H&H per protocol - Monitor for s/s of bleeding - Daily CBC - Transfuse for Hgb <7  Atrial Fibrillation Patient currently in NSR on monitor - home medications: amiodarone & eliquis on hold - s/p cardiology evaluation - appreciate input -Dr Ubaldo Glassing- restarted po amio                  -plan for repeat TTE                 - no plan for LHC at this time   At risk for multiple metabolic derangements due to ESRD - NS @ 10 mL (ESRD patient) - Correct electrolytes as indicated. - BMP q12hrs per TTM protocol     Best practice:  Diet: NPO Pain/Anxiety/Delirium protocol (if indicated): propofol & fentanyl VAP protocol (if indicated): initiated DVT prophylaxis: heparin SQ GI prophylaxis: pepcid Glucose control: Q 4 CBG, target range 140-180 Mobility: bedrest Code Status: FULL Family Communication: Updated wife & son bedside Disposition: ICU  Labs   CBC: Recent Labs  Lab 02/01/2020 1228 02/22/2020 1228 02/15/20 0045 02/15/20 0440 02/15/20 1409 02/16/20 0444 02/17/20 0415  WBC 6.4   < > 6.6 6.8 6.4 8.9 6.9  NEUTROABS 5.0  --  5.2  --   --  7.0 4.9  HGB 6.1*   < > 10.0* 10.9* 8.4* 9.4* 8.0*  HCT 19.4*   < > 30.5* 33.3* 24.4* 27.9*  22.0*  MCV 96.0   < > 93.3 93.3 91.0 93.0 92.8  PLT 124*   < > 125* 116* 123* 135* 92*   < > = values in this interval not displayed.    Basic Metabolic Panel: Recent Labs  Lab 02/22/2020 1228 02/15/20 0100 02/15/20 0440 02/15/20 1409 02/16/20 0444 02/17/20 0415  NA 140 141  --  137 139 136  K 5.9* 5.1  --  5.4* 4.6 4.9  CL 85* 87*  --  86* 92* 90*  CO2 23 22  --  21* 21* 20*  GLUCOSE 316* 169*  --  143* 157* 178*  BUN 91* 91*  --  90* 51* 61*  CREATININE 19.17* 18.82*  --  18.60* 12.34* 13.95*  CALCIUM 7.3* 6.5*  --  6.4* 7.3* 6.7*  MG 2.8*  --  2.8*  --  2.3 2.0  PHOS 15.8*  --  13.5* 13.5* 10.2* 10.0*   GFR: Estimated Creatinine Clearance: 7.7 mL/min (A) (by C-G formula based on SCr of 13.95 mg/dL (H)). Recent Labs  Lab 02/15/2020 1228 03/01/2020 1459 02/15/20 0045 02/15/20 0440 02/15/20 1409 02/16/20 0444 02/17/20 0415  PROCALCITON 0.87  --   --   --   --   --   --   WBC 6.4  --    < > 6.8 6.4 8.9 6.9  LATICACIDVEN 3.0* 1.7  --   --   --   --   --    < > = values in this interval not displayed.     Liver Function Tests: Recent Labs  Lab 02/18/2020 1228 02/15/20 1409  AST 17  --   ALT 14  --   ALKPHOS 73  --   BILITOT 1.0  --   PROT 7.4  --   ALBUMIN 3.2* 2.9*   No results for input(s): LIPASE, AMYLASE in the last 168 hours. No results for input(s): AMMONIA in the last 168 hours.  ABG    Component Value Date/Time   PHART 7.34 (L) 02/15/2020 0424   PCO2ART 41 02/15/2020 0424   PO2ART 85 02/15/2020 0424   HCO3 22.1 02/15/2020 0424   TCO2 22 11/08/2019 1118   ACIDBASEDEF 3.4 (H) 02/15/2020 0424   O2SAT 95.8 02/15/2020 0424     Coagulation Profile: Recent Labs  Lab 02/25/2020 1228  INR 1.3*    Cardiac Enzymes: Recent Labs  Lab 02/27/2020 1228  CKMB 33.0*    HbA1C: Hemoglobin A1C  Date/Time Value Ref Range Status  11/12/2019 08:43 AM 5.8 (A) 4.0 - 5.6 % Final  07/11/2019 08:22 AM 5.6 4.0 - 5.6 % Final  03/14/2012 03:21 AM 5.0 4.2 - 6.3 % Final    Comment:    The American Diabetes Association recommends that a primary goal of therapy should be <7% and that physicians should reevaluate the treatment regimen in patients with HbA1c values consistently >8%.    Hgb A1c MFr Bld  Date/Time Value Ref Range Status  02/04/2020 12:28 PM 5.7 (H) 4.8 - 5.6 % Final    Comment:    (NOTE)         Prediabetes: 5.7 - 6.4         Diabetes: >6.4         Glycemic control for adults with diabetes: <7.0   03/02/2019 12:17 AM 6.1 (H) 4.8 - 5.6 % Final    Comment:    (NOTE) Pre diabetes:          5.7%-6.4% Diabetes:              >  6.4% Glycemic control for   <7.0% adults with diabetes     CBG: Recent Labs  Lab 02/16/20 1809 02/16/20 2000 02/16/20 2306 02/17/20 0348 02/17/20 0723  GLUCAP 114* 141* 154* 154* 153*    Review of Systems:   UTA, patient s/p cardiac arrest, unresponsive- intubated requiring mechanical ventilation.  Past Medical History  He,  has a past medical history of Acute on chronic diastolic CHF (congestive heart failure) ([place]) (06/21/2018),  Acute respiratory failure with hypoxia ([place]) (06/21/2018), Bell palsy (02/12/2015), Carpal tunnel syndrome (02/12/2015), Dependence on nocturnal oxygen therapy, Depression, Gastric ulcer, GERD (gastroesophageal reflux disease), Gout, History of chicken pox, Multifocal pneumonia (03/04/2019), and Sleep apnea treated with nocturnal BiPAP.   Surgical History    Past Surgical History:  Procedure Laterality Date  . AV FISTULA PLACEMENT Left 11/08/2019   Procedure: ARTERIOVENOUS (AV) FISTULA CREATION (RADIALCEPHALIC);  Surgeon: Algernon Huxley, MD;  Location: [facility] ORS;  Service: Vascular;  Laterality: Left;  . CATARACT EXTRACTION Bilateral 2014 and 2015  . CHOLECYSTECTOMY  04/28/2011   Laproscopic; Dr. Pat Patrick  . COLONOSCOPY WITH PROPOFOL N/A 12/30/2019   Procedure: COLONOSCOPY WITH PROPOFOL;  Surgeon: Lin Landsman, MD;  Location: [facility];  Service: Gastroenterology;  Laterality: N/A;  . DIALYSIS/PERMA CATHETER INSERTION N/A 05/24/2019   Procedure: DIALYSIS/PERMA CATHETER INSERTION;  Surgeon: Algernon Huxley, MD;  Location: [place] [facility];  Service: Cardiovascular;  Laterality: N/A;  . DIALYSIS/PERMA CATHETER INSERTION N/A 10/09/2019   Procedure: DIALYSIS/PERMA CATHETER INSERTION;  Surgeon: Katha Cabal, MD;  Location: [place] [facility];  Service: Cardiovascular;  Laterality: N/A;  . ESOPHAGOGASTRODUODENOSCOPY (EGD) WITH PROPOFOL N/A 12/29/2019   Procedure: ESOPHAGOGASTRODUODENOSCOPY (EGD) WITH PROPOFOL;  Surgeon: Lin Landsman, MD;  Location: [facility] ENDOSCOPY;  Service: Gastroenterology;  Laterality: N/A;  . EYE SURGERY    . GALLBLADDER SURGERY    . LASIK Bilateral 2019   medical  . SHOULDER SURGERY Right 2012   Dr. Leanor Kail     Social History   reports that he quit smoking about 41 years ago. His smoking use included cigarettes. He has a 15.00 pack-year smoking history. He has never used smokeless tobacco. He reports that he does not drink alcohol and does not use drugs.    Family History   His family history includes COPD in his mother; Cancer in his mother; Heart disease in his father.   Allergies Allergies  Allergen Reactions  . Mucinex [Guaifenesin Er] Swelling    Throat swelling, increases heart rate  . Levaquin [Levofloxacin] Palpitations     Home Medications  Prior to Admission medications   Medication Sig Start Date End Date Taking? Authorizing Provider  amiodarone (PACERONE) 200 MG tablet Take 200 mg by mouth daily. 08/16/19  Yes [provider]  amitriptyline (ELAVIL) 25 MG tablet TAKE 1 TABLET AT BEDTIME Patient taking differently: Take 25 mg by mouth at bedtime.  07/26/19  Yes [name], MD  atorvastatin (LIPITOR) 40 MG tablet Take 1 tablet (40 mg total) by mouth daily. 01/13/20  Yes [name], MD  Budeson-Glycopyrrol-Formoterol (BREZTRI AEROSPHERE) 160-9-4.8 MCG/ACT AERO Inhale 2 puffs into the lungs in the morning and at bedtime.    Yes [provider]  calcitRIOL (ROCALTROL) 0.5 MCG capsule Take 0.5 mcg by mouth daily. 12/29/18  Yes [provider]  FLOMAX 0.4 MG CAPS capsule Take 0.4 mg by mouth daily. 01/02/20  Yes [provider]  insulin regular human CONCENTRATED (HUMULIN R) 500 UNIT/ML injection Inject up to 60  units twice a day as directed by physician 02/11/20  Yes [name], MD  Ipratropium-Albuterol (COMBIVENT RESPIMAT) 20-100 MCG/ACT AERS respimat Inhale 2 puffs into the lungs every 4 (four) hours as needed for wheezing.    Yes [provider]  lactulose (CHRONULAC) 10 GM/15ML solution Take 30 mLs by mouth daily. 09/10/19  Yes [provider]  levothyroxine (SYNTHROID) 100 MCG tablet TAKE 1 TABLET DAILY Patient taking differently: Take 100 mcg by mouth daily before breakfast.  08/01/19  Yes Fisher, Kirstie Peri, MD  metoprolol tartrate (LOPRESSOR) 50 MG tablet Take 0.5 tablets (25 mg total) by mouth 2 (two) times daily. 01/01/20  Yes Shawna Clamp, MD  pantoprazole  (PROTONIX) 40 MG tablet TAKE 1 TABLET TWICE A DAY 02/05/20  Yes [name], MD  sevelamer carbonate (RENVELA) 800 MG tablet Take by mouth. 12/27/19  Yes [provider]  torsemide (DEMADEX) 10 MG tablet Take 40 mg by mouth 2 (two) times daily.  08/13/19  Yes [provider]  ULORIC 80 MG TABS Take 80 mg by mouth daily.  05/18/17  Yes [provider]  Ascorbic Acid (VITAMIN C) 1000 MG tablet Take 1,000 mg by mouth 2 (two) times daily.     [provider]  BD INSULIN SYRINGE U/F 31G X 5/16" 1 ML MISC  11/18/19   [provider]  cetirizine (ZYRTEC) 10 MG tablet Take 10 mg by mouth daily as needed for allergies.     [provider]  ELIQUIS 2.5 MG TABS tablet Take 2.5 mg by mouth 2 (two) times daily.  03/23/19   [provider]  gentamicin cream (GARAMYCIN) 0.1 % Apply 1 application topically daily. 09/22/19   [provider]  Spacer/Aero-Holding Chambers (AEROCHAMBER PLUS WITH MASK) inhaler See admin instructions. 12/05/19   [provider]  Spacer/Aero-Holding Josiah Lobo (EASIVENT) inhaler See admin instructions. 12/05/19 12/04/20  [provider]      Critical care provider statement:    Critical care time (minutes):  34   Critical care time was exclusive of:  Separately billable procedures and  treating other patients   Critical care was necessary to treat or prevent imminent or  life-threatening deterioration of the following conditions:  acute hypoxemic respiratory failure, cardiac arrest, AKI/CKD, COPD, OSA, obesity, DM, CHF, multiple comorbid conditions.    Critical care was time spent personally by me on the following  activities:  Development of treatment plan with patient or surrogate,  discussions with consultants, evaluation of patient's response to  treatment, examination of patient, obtaining history from patient or  surrogate, ordering and performing treatments and interventions, ordering  and  review of laboratory studies and re-evaluation of patient's condition   I assumed direction of critical care for this patient from another  provider in my specialty: no       Ottie Glazier, M.D.  Pulmonary & [place]

## 2020-02-17 NOTE — Consult Note (Signed)
Cardiology Consultation Note    Patient ID: Patrick Hurst, MRN: [id number], DOB/AGE: [birth year] 64 y.o. Admit date: 02/26/2020   Date of Consult: 02/17/2020 Primary Physician: [name], MD Primary Cardiologist: Dr. Clayborn Bigness  Chief Complaint: cardiac arrest Reason for Consultation: afib Requesting MD: Dr. Lanney Gins  HPI: Patrick Hurst is a 64 y.o. male with history of paroxysmal atrial fibrillation, HFpEF with an ejection fraction of 55 to 60%, hypertension, thyroid disease, diabetes, exogenous obesity, thrombocytopenia, end-stage renal disease who was admitted after presenting for GI work-up where he suffered a PEA cardiac arrest on October 14 during the procedure.  He had approximately 10 minutes of CPR and intubated and required echo ventilation...  Echocardiogram postarrest was read as showing preserved LV function with EF of 50 to 55% which is low normal.  No regional wall motion abnormalities.  Moderate LVH.  He was felt to have mild aortic sclerosis with no significant stenosis.  He was placed on code ice.  It is of note that he had previous admission in April of this year with acute hypoxic respiratory failure as well as symptomatic anemia.  Endoscopy team was present at the onset of the arrest stated the patient became bradycardiac followed by PEA.  As hemoglobin was 6.1.  He had sinus rhythm on the monitor.  He had had his amiodarone and Eliquis held prior to the procedure.  Troponin was mildly elevated and flat consistent with demand.  Did not appear to have have had an acute coronary event.  EKG at the time showed sinus rhythm with nonspecific ST-T wave changes.  Head CT at the time of arrest showed no mass or hemorrhage with no acute infarct.  Chest x-ray postintubation showed cardiomegaly with vascular congestion.  Laboratory showed a potassium of 5.9 with a creatinine of 19.17.  BNP was 116.9 patient remains intubated.  He has predominantly been in sinus rhythm although has had  episodes of breakthrough A. fib with RVR.  Remains on IV vasopressin and has been weaned off of IV Levophed.  Long discussion with patient's wife and son regarding the patient's hospital course and current status. Past Medical History:  Diagnosis Date  . Acute on chronic diastolic CHF (congestive heart failure) ([place]) 06/21/2018  . Acute respiratory failure with hypoxia ([place]) 06/21/2018  . Bell palsy 02/12/2015  . Carpal tunnel syndrome 02/12/2015  . Dependence on nocturnal oxygen therapy    2 LITERS WITH BIPAP  . Depression   . Gastric ulcer   . GERD (gastroesophageal reflux disease)   . Gout   . History of chicken pox   . Multifocal pneumonia 03/04/2019  . Sleep apnea treated with nocturnal BiPAP    uses bipap      Surgical History:  Past Surgical History:  Procedure Laterality Date  . AV FISTULA PLACEMENT Left 11/08/2019   Procedure: ARTERIOVENOUS (AV) FISTULA CREATION (RADIALCEPHALIC);  Surgeon: Algernon Huxley, MD;  Location: [facility] ORS;  Service: Vascular;  Laterality: Left;  . CATARACT EXTRACTION Bilateral 2014 and 2015  . CHOLECYSTECTOMY  04/28/2011   Laproscopic; Dr. Pat Patrick  . COLONOSCOPY WITH PROPOFOL N/A 12/30/2019   Procedure: COLONOSCOPY WITH PROPOFOL;  Surgeon: Lin Landsman, MD;  Location: [facility] ENDOSCOPY;  Service: Gastroenterology;  Laterality: N/A;  . DIALYSIS/PERMA CATHETER INSERTION N/A 05/24/2019   Procedure: DIALYSIS/PERMA CATHETER INSERTION;  Surgeon: Algernon Huxley, MD;  Location: [place] CV LAB;  Service: Cardiovascular;  Laterality: N/A;  . DIALYSIS/PERMA CATHETER INSERTION N/A 10/09/2019   Procedure: DIALYSIS/PERMA  CATHETER INSERTION;  Surgeon: Katha Cabal, MD;  Location: [place] CV LAB;  Service: Cardiovascular;  Laterality: N/A;  . ESOPHAGOGASTRODUODENOSCOPY (EGD) WITH PROPOFOL N/A 12/29/2019   Procedure: ESOPHAGOGASTRODUODENOSCOPY (EGD) WITH PROPOFOL;  Surgeon: Lin Landsman, MD;  Location: [facility] ENDOSCOPY;  Service: Gastroenterology;   Laterality: N/A;  . EYE SURGERY    . GALLBLADDER SURGERY    . LASIK Bilateral 2019   medical  . SHOULDER SURGERY Right 2012   Dr. Leanor Kail     Home Meds: Prior to Admission medications   Medication Sig Start Date End Date Taking? Authorizing Provider  amiodarone (PACERONE) 200 MG tablet Take 200 mg by mouth daily. 08/16/19  Yes [provider]  amitriptyline (ELAVIL) 25 MG tablet TAKE 1 TABLET AT BEDTIME Patient taking differently: Take 25 mg by mouth at bedtime.  07/26/19  Yes [name], MD  atorvastatin (LIPITOR) 40 MG tablet Take 1 tablet (40 mg total) by mouth daily. 01/13/20  Yes [name], MD  Budeson-Glycopyrrol-Formoterol (BREZTRI AEROSPHERE) 160-9-4.8 MCG/ACT AERO Inhale 2 puffs into the lungs in the morning and at bedtime.    Yes [provider]  calcitRIOL (ROCALTROL) 0.5 MCG capsule Take 0.5 mcg by mouth daily. 12/29/18  Yes [provider]  FLOMAX 0.4 MG CAPS capsule Take 0.4 mg by mouth daily. 01/02/20  Yes [provider]  insulin regular human CONCENTRATED (HUMULIN R) 500 UNIT/ML injection Inject up to 60 units twice a day as directed by physician 02/11/20  Yes [name], MD  Ipratropium-Albuterol (COMBIVENT RESPIMAT) 20-100 MCG/ACT AERS respimat Inhale 2 puffs into the lungs every 4 (four) hours as needed for wheezing.    Yes [provider]  lactulose (CHRONULAC) 10 GM/15ML solution Take 30 mLs by mouth daily. 09/10/19  Yes [provider]  levothyroxine (SYNTHROID) 100 MCG tablet TAKE 1 TABLET DAILY Patient taking differently: Take 100 mcg by mouth daily before breakfast.  08/01/19  Yes Fisher, Kirstie Peri, MD  metoprolol tartrate (LOPRESSOR) 50 MG tablet Take 0.5 tablets (25 mg total) by mouth 2 (two) times daily. 01/01/20  Yes Shawna Clamp, MD  pantoprazole (PROTONIX) 40 MG tablet TAKE 1 TABLET TWICE A DAY Patient taking differently: Take 40 mg by mouth 2 (two) times daily.  02/05/20  Yes [name], MD  sevelamer carbonate (RENVELA) 800 MG tablet Take by mouth. 12/27/19  Yes [provider]  torsemide (DEMADEX) 10 MG tablet Take 40 mg by mouth 2 (two) times daily.  08/13/19  Yes [provider]  ULORIC 80 MG TABS Take 80 mg by mouth daily.  05/18/17  Yes [provider]  Ascorbic Acid (VITAMIN C) 1000 MG tablet Take 1,000 mg by mouth 2 (two) times daily.     [provider]  BD INSULIN SYRINGE U/F 31G X 5/16" 1 ML MISC  11/18/19   [provider]  cetirizine (ZYRTEC) 10 MG tablet Take 10 mg by mouth daily as needed for allergies.     [provider]  ELIQUIS 2.5 MG TABS tablet Take 2.5 mg by mouth 2 (two) times daily.  03/23/19   [provider]  gentamicin cream (GARAMYCIN) 0.1 % Apply 1 application topically daily. 09/22/19   [provider]  Spacer/Aero-Holding Chambers (AEROCHAMBER PLUS WITH MASK) inhaler See admin instructions. 12/05/19   [provider]  Spacer/Aero-Holding Josiah Lobo (EASIVENT) inhaler See admin instructions. 12/05/19 12/04/20  [provider]    Inpatient Medications:  . sodium chloride  Intravenous Once  . chlorhexidine gluconate (MEDLINE KIT)  15 mL Mouth Rinse BID  . Chlorhexidine Gluconate Cloth  6 each Topical Daily  . docusate  100 mg Per Tube BID  . fentaNYL (SUBLIMAZE) injection  100 mcg Intravenous Once  . gentamicin cream  1 application Topical Daily  . heparin  5,000 Units Subcutaneous Q8H  . insulin aspart  0-6 Units Subcutaneous Q4H  . mouth rinse  15 mL Mouth Rinse 10 times per day  . polyethylene glycol  17 g Per Tube Daily   . sodium chloride    . sodium chloride 10 mL/hr at 02/15/20 0600  . amiodarone 60 mg/hr (02/17/20 0709)  . dialysis solution 1.5% low-MG/low-CA    . famotidine (PEPCID) IV Stopped (02/16/20 1845)  . fentaNYL infusion INTRAVENOUS 200 mcg/hr (02/17/20 0500)  . norepinephrine (LEVOPHED) Adult infusion 6 mcg/min (02/17/20 0500)  .  propofol (DIPRIVAN) infusion 30 mcg/kg/min (02/17/20 0707)  . vasopressin 0.03 Units/min (02/17/20 0500)    Allergies:  Allergies  Allergen Reactions  . Mucinex [Guaifenesin Er] Swelling    Throat swelling, increases heart rate  . Levaquin [Levofloxacin] Palpitations    Social History   Socioeconomic History  . Marital status: Married    Spouse name: Hilda Blades  . Number of children: Not on file  . Years of education: Not on file  . Highest education level: Not on file  Occupational History  . Occupation: Works at [employer]    Comment: Full time  Tobacco Use  . Smoking status: Former Smoker    Packs/day: 1.00    Years: 15.00    Pack years: 15.00    Types: Cigarettes    Quit date: 05/03/1978    Years since quitting: 41.8  . Smokeless tobacco: Never Used  . Tobacco comment: started smoking at age 44  Vaping Use  . Vaping Use: Never used  Substance and Sexual Activity  . Alcohol use: No    Alcohol/week: 0.0 standard drinks  . Drug use: No  . Sexual activity: Not Currently  Other Topics Concern  . Not on file  Social History Narrative  . Not on file   Social Determinants of Health   Financial Resource Strain:   . Difficulty of Paying Living Expenses: Not on file  Food Insecurity:   . Worried About Charity fundraiser in the Last Year: Not on file  . Ran Out of Food in the Last Year: Not on file  Transportation Needs:   . Lack of Transportation (Medical): Not on file  . Lack of Transportation (Non-Medical): Not on file  Physical Activity:   . Days of Exercise per Week: Not on file  . Minutes of Exercise per Session: Not on file  Stress:   . Feeling of Stress : Not on file  Social Connections:   . Frequency of Communication with Friends and Family: Not on file  . Frequency of Social Gatherings with Friends and Family: Not on file  . Attends Religious Services: Not on file  . Active Member of Clubs or Organizations: Not on file  . Attends Archivist  Meetings: Not on file  . Marital Status: Not on file  Intimate Partner Violence:   . Fear of Current or Ex-Partner: Not on file  . Emotionally Abused: Not on file  . Physically Abused: Not on file  . Sexually Abused: Not on file     Family History  Problem Relation Age of Onset  . COPD Mother   .  Cancer Mother   . Heart disease Father      Review of Systems: A 12-system review of systems was performed and is negative except as noted in the HPI.  Labs: Recent Labs    02/28/2020 1228  CKMB 33.0*   Lab Results  Component Value Date   WBC 6.9 02/17/2020   HGB 8.0 (L) 02/17/2020   HCT 22.0 (L) 02/17/2020   MCV 92.8 02/17/2020   PLT 92 (L) 02/17/2020    Recent Labs  Lab 02/11/2020 1228 02/15/20 0100 02/17/20 0415  NA 140   < > 136  K 5.9*   < > 4.9  CL 85*   < > 90*  CO2 23   < > 20*  BUN 91*   < > 61*  CREATININE 19.17*   < > 13.95*  CALCIUM 7.3*   < > 6.7*  PROT 7.4  --   --   BILITOT 1.0  --   --   ALKPHOS 73  --   --   ALT 14  --   --   AST 17  --   --   GLUCOSE 316*   < > 178*   < > = values in this interval not displayed.   Lab Results  Component Value Date   CHOL 101 02/20/2018   HDL 24 (L) 02/20/2018   LDLCALC 35 02/20/2018   TRIG 208 (H) 02/20/2018   No results found for: DDIMER  Radiology/Studies:  DG Abd 1 View  Result Date: 02/20/2020 CLINICAL DATA:  OG tube placement EXAM: ABDOMEN - 1 VIEW COMPARISON:  July 26, 2019 FINDINGS: The enteric tube projects over the gastric body. The visualized bowel gas pattern is nonspecific. IMPRESSION: The enteric tube projects over the gastric body. Electronically Signed   By: Constance Holster M.D.   On: 02/09/2020 17:55   CT HEAD WO CONTRAST  Result Date: 02/25/2020 CLINICAL DATA:  Status post cardiac arrest EXAM: CT HEAD WITHOUT CONTRAST TECHNIQUE: Contiguous axial images were obtained from the base of the skull through the vertex without intravenous contrast. COMPARISON:  March 13, 2012 head CT and  brain MRI. FINDINGS: Brain: Ventricles and sulci are normal in size and configuration. There is no intracranial mass, hemorrhage, extra-axial fluid collection, or midline shift. There is preservation of gray-white differentiation without evident cerebral edema. Brain parenchyma appears unremarkable. No acute infarct evident. There is a mild area of deep sulcal asymmetry in the right temporal region compared to the left, stable and a presumed anatomic variant. Vascular: No hyperdense vessel. There is calcification in each carotid siphon region. Skull: The bony calvarium appears intact. Sinuses/Orbits: Visualized paranasal sinuses are clear. Orbits appear symmetric bilaterally. Other: Mastoid air cells are clear. IMPRESSION: Preservation of gray-white differentiation without edema. No acute infarct. No mass or hemorrhage. Foci of arterial vascular calcification noted. Electronically Signed   By: Lowella Grip III M.D.   On: 02/22/2020 13:03   DG Chest Port 1 View  Result Date: 02/17/2020 CLINICAL DATA:  Respiratory failure EXAM: PORTABLE CHEST 1 VIEW COMPARISON:  02/15/2020 FINDINGS: Unchanged support apparatus. Shallow lung inflation without focal airspace consolidation. Mild cardiomegaly. IMPRESSION: Shallow lung inflation without focal airspace consolidation. Electronically Signed   By: Ulyses Jarred M.D.   On: 02/17/2020 06:21   DG Chest Port 1 View  Result Date: 02/15/2020 CLINICAL DATA:  Respiratory failure. EXAM: PORTABLE CHEST 1 VIEW COMPARISON:  Chest x-ray 02/29/2020, CT chest 11/19/2019 FINDINGS: Endotracheal tube with tip approximately 8 cm above the carina.  Enteric tube courses below diaphragm with tip and side port overlying the expected region the gastric lumen. PO contrast is noted partially opacifying the gastric lumen. Right chest wall dialysis catheter again noted with tip overlying the right atrium with tip near the inferior cavoatrial junction a (tip likely more cranially within the  right atrium; however, due to low lung volumes tip is near the junction). The heart size and mediastinal contours are unchanged. Low lung volumes. Vague patchy airspace opacities. Increased interstitial markings. No pleural effusion. No pneumothorax. No acute osseous abnormality. IMPRESSION: 1. Enteric tube with tip terminating 8 cm above the carina. Consider advancing by 2 cm. 2. Low lung volumes with vague patchy airspace opacities that likely represent atelectasis. 3. Cardiomegaly with mild pulmonary edema. Electronically Signed   By: Iven Finn M.D.   On: 02/15/2020 02:28   DG Chest Port 1 View  Result Date: 03/02/2020 CLINICAL DATA:  Intubation EXAM: PORTABLE CHEST 1 VIEW COMPARISON:  12/27/2019 FINDINGS: There is a well-positioned tunneled dialysis catheter on the right. The endotracheal tube terminates above the carina and just below the thoracic inlet. The tube terminates approximately 8 cm above the carina. The enteric tube appears to extend below the left hemidiaphragm. The heart size is stable but enlarged. The lung volumes are low. There is vascular congestion with small bilateral pleural effusions and bibasilar atelectasis. There is no acute osseous abnormality. IMPRESSION: 1. Lines and tubes as above. 2. Low lung volumes with bibasilar atelectasis and small bilateral pleural effusions. 3. Cardiomegaly with vascular congestion. Electronically Signed   By: Constance Holster M.D.   On: 02/08/2020 17:54   EEG adult  Result Date: 02/09/2020 Alexis Goodell, MD     02/03/2020  3:29 PM ELECTROENCEPHALOGRAM REPORT Patient: AYIDEN MILLIMAN       Room #: IC06A EEG No. ID: [id number] Age: 64 y.o.        Sex: male Requesting Physician: Kasa Report Date:  02/27/2020       Interpreting Physician: Alexis Goodell History: WETZEL MEESTER is an 64 y.o. male with PEA arrest during colonoscopy Medications: Fentanyl, Propofol, Levophed, ASA, Conditions of Recording:  This is a 21 channel routine scalp EEG  performed with bipolar and monopolar montages arranged in accordance to the international 10/20 system of electrode placement. One channel was dedicated to EKG recording. The patient is in the intubated and sedated state. Description:  The background activity is very low voltage and often obscured by low voltage artifact and electrocardiogram artifact.   At times some very slow polymorphic delta activity could be identified.  No epileptiform activity is noted.  The patient was stimulated during the recording with no activation of the background rhythm noted.  Hyperventilation and intermittent photic stimulation were not performed. IMPRESSION: This electroencephalogram is characterized by very slow, low voltage background activity that is consistent with a medication effect, but can not rule out a severe diffuse cerebral disturbance as well, etiologically nonspecific.  Clinical correlation recommended.  No epileptiform activity is noted.  Alexis Goodell, MD Neurology 02/27/2020, 2:43 PM   ECHOCARDIOGRAM COMPLETE  Result Date: 02/10/2020    ECHOCARDIOGRAM REPORT   Patient Name:   BRAYLON GRENDA Date of Exam: 02/25/2020 Medical Rec #:  [id number]      Height:       72.0 in Accession #:    [id number]     Weight:       289.2 lb Date of Birth:  [birth year]      BSA:  2.492 m Patient Age:    25 years       BP:           121/46 mmHg Patient Gender: M              HR:           88 bpm. Exam Location:  [facility] Procedure: 2D Echo, Cardiac Doppler and Color Doppler Indications:     Cardiac Arrest 146.9  History:         Patient has prior history of Echocardiogram examinations, most                  recent 11/16/2019. CHF.  Sonographer:     Sherrie Sport RDCS (AE) Referring Phys:  [id number] BRITTON L RUST-CHESTER Diagnosing Phys: Ida Rogue MD  Sonographer Comments: Echo performed with patient supine and on artificial respirator. IMPRESSIONS  1. Left ventricular ejection fraction, by estimation, is 50 to 55%. The left  ventricle has low normal function. The left ventricle has no regional wall motion abnormalities. There is moderate left ventricular hypertrophy. Indeterminate diastolic filling  due to E-A fusion.  2. The aortic valve was not well visualized. Aortic valve regurgitation is not visualized. Mild to moderate aortic valve sclerosis/calcification is present, without any evidence of aortic stenosis. FINDINGS  Left Ventricle: Left ventricular ejection fraction, by estimation, is 50 to 55%. The left ventricle has low normal function. The left ventricle has no regional wall motion abnormalities. The left ventricular internal cavity size was normal in size. There is moderate left ventricular hypertrophy. Indeterminate diastolic filling due to E-A fusion. Right Ventricle: The right ventricular size is normal. No increase in right ventricular wall thickness. Right ventricular systolic function is normal. Tricuspid regurgitation signal is inadequate for assessing PA pressure. Left Atrium: Left atrial size was normal in size. Right Atrium: Right atrial size was normal in size. Pericardium: There is no evidence of pericardial effusion. Mitral Valve: The mitral valve is normal in structure. Mild to moderate mitral annular calcification. No evidence of mitral valve regurgitation. No evidence of mitral valve stenosis. Tricuspid Valve: The tricuspid valve is normal in structure. Tricuspid valve regurgitation is not demonstrated. No evidence of tricuspid stenosis. Aortic Valve: The aortic valve was not well visualized. Aortic valve regurgitation is not visualized. Mild to moderate aortic valve sclerosis/calcification is present, without any evidence of aortic stenosis. Aortic valve mean gradient measures 5.5 mmHg.  Aortic valve peak gradient measures 10.2 mmHg. Aortic valve area, by VTI measures 2.12 cm. Pulmonic Valve: The pulmonic valve was normal in structure. Pulmonic valve regurgitation is not visualized. No evidence of pulmonic  stenosis. Aorta: The aortic root is normal in size and structure. Venous: The inferior vena cava is normal in size with greater than 50% respiratory variability, suggesting right atrial pressure of 3 mmHg. IAS/Shunts: No atrial level shunt detected by color flow Doppler.  LEFT VENTRICLE PLAX 2D LVIDd:         4.64 cm  Diastology LVIDs:         2.94 cm  LV e' medial:    7.29 cm/s LV PW:         1.74 cm  LV E/e' medial:  12.2 LV IVS:        1.94 cm  LV e' lateral:   4.46 cm/s LVOT diam:     2.30 cm  LV E/e' lateral: 19.9 LV SV:         58 LV SV Index:   23 LVOT Area:  4.15 cm  LEFT ATRIUM             Index       RIGHT ATRIUM           Index LA Vol (A2C):   71.8 ml 28.81 ml/m RA Area:     27.80 cm LA Vol (A4C):   47.2 ml 18.94 ml/m RA Volume:   99.70 ml  40.01 ml/m LA Biplane Vol: 61.2 ml 24.56 ml/m  AORTIC VALVE                    PULMONIC VALVE AV Area (Vmax):    2.03 cm     PV Vmax:        0.93 m/s AV Area (Vmean):   1.97 cm     PV Peak grad:   3.4 mmHg AV Area (VTI):     2.12 cm     RVOT Peak grad: 5 mmHg AV Vmax:           160.00 cm/s AV Vmean:          106.000 cm/s AV VTI:            0.272 m AV Peak Grad:      10.2 mmHg AV Mean Grad:      5.5 mmHg LVOT Vmax:         78.30 cm/s LVOT Vmean:        50.300 cm/s LVOT VTI:          0.139 m LVOT/AV VTI ratio: 0.51  AORTA Ao Root diam: 3.60 cm MITRAL VALVE               TRICUSPID VALVE MV Area (PHT): 2.48 cm    TR Peak grad:   14.4 mmHg MV Decel Time: 306 msec    TR Vmax:        190.00 cm/s MV E velocity: 88.80 cm/s MV A velocity: 47.50 cm/s  SHUNTS MV E/A ratio:  1.87        Systemic VTI:  0.14 m                            Systemic Diam: 2.30 cm Ida Rogue MD Electronically signed by Ida Rogue MD Signature Date/Time: 02/29/2020/4:14:37 PM    Final     Wt Readings from Last 3 Encounters:  02/17/20 132 kg  01/30/20 (!) 136.7 kg  01/25/20 135.9 kg    EKG: Predominantly sinus rhythm with intermittent A. fib with RVR  Physical Exam: Acutely  ill-appearing male Blood pressure (!) 89/47, pulse 88, temperature 99.7 F (37.6 C), temperature source Oral, resp. rate 20, height 6' (1.829 m), weight 132 kg, SpO2 94 %. Body mass index is 39.47 kg/m. General: Intubated and sedated. Head: Normocephalic, atraumatic, sclera non-icteric, no xanthomas, nares are without discharge.  Neck: Negative for carotid bruits. JVD not elevated. Lungs: Breathing with ventilator. Heart: Regular rate and rhythm Abdomen: Not able to assess due to body habitus Msk:  Strength and tone appear normal for age. Extremities: 2-3+ edema bilaterally Neuro: Intubated and sedated     Assessment and Plan  64 yo male with a significant history of COPD, OSA, T2DM, ESRD, CHF, AF presented to Endoscopy for an outpatient colonoscopy procedure.    During the procedure his heart rate began to drop and he PEA arrested requiring 10 minutes of CPR prior to achieving ROSC.  He received epinephrine, atropine, calcium, sodium bicarb & D50.  He was intubated during the code blue requiring mechanical ventilation.  Patient underwent code ice protocol.  He currently is intubated and sedated on IV vasopressin and IV amiodarone.  1.  Cardiac arrest-etiology of this is unclear.  Appear to be a PEA arrest.  Patient's high-sensitivity troponin was mildly elevated but relatively flat.  Does not appear to suggest an acute coronary syndrome event.  EKG also did not suggest acute coronary syndrome without ST elevation or to suggest this.  Patient did not have pericardial effusion.  No evidence of pneumothorax.  Electrolytes were stable.  Patient received prompt ACLS care.  Currently intubated and sedated.  Further evaluation of the patient's mental status can be carried out after he is extubated.  2.  Respiratory arrest-currently ventilator dependent.  Appreciate excellent care by ICU team.  Attempt to extubate today weaning off of pressors and sedation.  3.  Abnormal serum troponin-as per above,  does not appear to be an acute coronary event.  Further assessment of this can be considered once he is recovered from this event.  Not a candidate for invasive evaluation at present.  Would continue with subcu heparin.  Was symptomatically anemic and required transfusion.  Would not use antiplatelet therapy or anticoagulation at present until this has been further assessed.  4.  Atrial fibrillation-has had paroxysmal A. fib as an outpatient.  Was on Eliquis and amiodarone.  Had stopped the Eliquis and amiodarone prior to his EGD/colonoscopy.  Would remain off of Eliquis due to concern over GI bleed.  Would convert from IV amiodarone to amiodarone 200 mg twice daily via his NG tube.  Will use IV boluses should he have breakthrough A. fib that is persistent.  5.  End-stage renal disease-appreciate nephrology input.  Will likely require HD for some time.  Had been on PD as an outpatient.  6.  Anoxic encephalopathy.-Concern over this given length of CPR.  This can be further evaluated once he is been extubated.  7.  CHF-EF 50 to 55% by echo postarrest.  Will repeat limited echo in the morning to evaluate for any change.  Signed, Teodoro Spray MD 02/17/2020, 8:39 AM Pager: [phone]

## 2020-02-18 ENCOUNTER — Encounter: Payer: Self-pay | Admitting: Internal Medicine

## 2020-02-18 ENCOUNTER — Inpatient Hospital Stay: Payer: Commercial Managed Care - PPO

## 2020-02-18 ENCOUNTER — Telehealth: Payer: Self-pay | Admitting: Gastroenterology

## 2020-02-18 ENCOUNTER — Inpatient Hospital Stay
Admission: AD | Admit: 2020-02-18 | Discharge: 2020-02-18 | Disposition: A | Discharge status: Home / Self Care | Payer: Commercial Managed Care - PPO | Source: Home / Self Care | Attending: Cardiology | Admitting: Cardiology

## 2020-02-18 DIAGNOSIS — I469 Cardiac arrest, cause unspecified: Secondary | ICD-10-CM | POA: Diagnosis not present

## 2020-02-18 LAB — CBC WITH DIFFERENTIAL/PLATELET
Abs Immature Granulocytes: 0.08 10*3/uL — ABNORMAL HIGH (ref 0.00–0.07)
Basophils Absolute: 0.1 10*3/uL (ref 0.0–0.1)
Basophils Relative: 1 %
Eosinophils Absolute: 0.1 10*3/uL (ref 0.0–0.5)
Eosinophils Relative: 1 %
HCT: 23.5 % — ABNORMAL LOW (ref 39.0–52.0)
Hemoglobin: 7.9 g/dL — ABNORMAL LOW (ref 13.0–17.0)
Immature Granulocytes: 1 %
Lymphocytes Relative: 7 %
Lymphs Abs: 0.5 10*3/uL — ABNORMAL LOW (ref 0.7–4.0)
MCH: 31.3 pg (ref 26.0–34.0)
MCHC: 33.6 g/dL (ref 30.0–36.0)
MCV: 93.3 fL (ref 80.0–100.0)
Monocytes Absolute: 0.5 10*3/uL (ref 0.1–1.0)
Monocytes Relative: 8 %
Neutro Abs: 5.7 10*3/uL (ref 1.7–7.7)
Neutrophils Relative %: 82 %
Platelets: 84 10*3/uL — ABNORMAL LOW (ref 150–400)
RBC: 2.52 MIL/uL — ABNORMAL LOW (ref 4.22–5.81)
RDW: 16.2 % — ABNORMAL HIGH (ref 11.5–15.5)
WBC: 6.9 10*3/uL (ref 4.0–10.5)
nRBC: 0.4 % — ABNORMAL HIGH (ref 0.0–0.2)

## 2020-02-18 LAB — BLOOD GAS, ARTERIAL
Acid-base deficit: 6.5 mmol/L — ABNORMAL HIGH (ref 0.0–2.0)
Bicarbonate: 20.9 mmol/L (ref 20.0–28.0)
FIO2: 1
MECHVT: 550 mL
O2 Saturation: 99.3 %
PEEP: 10 cmH2O
Patient temperature: 37
RATE: 20 resp/min
pCO2 arterial: 51 mmHg — ABNORMAL HIGH (ref 32.0–48.0)
pH, Arterial: 7.22 — ABNORMAL LOW (ref 7.350–7.450)
pO2, Arterial: 172 mmHg — ABNORMAL HIGH (ref 83.0–108.0)

## 2020-02-18 LAB — MAGNESIUM: Magnesium: 2.3 mg/dL (ref 1.7–2.4)

## 2020-02-18 LAB — ECHOCARDIOGRAM LIMITED
Area-P 1/2: 2.52 cm2
Calc EF: 44.5 %
Height: 72 in
S' Lateral: 4.18 cm
Single Plane A2C EF: 47.8 %
Single Plane A4C EF: 37.6 %
Weight: 4677.28 oz

## 2020-02-18 LAB — GLUCOSE, CAPILLARY
Glucose-Capillary: 137 mg/dL — ABNORMAL HIGH (ref 70–99)
Glucose-Capillary: 152 mg/dL — ABNORMAL HIGH (ref 70–99)
Glucose-Capillary: 158 mg/dL — ABNORMAL HIGH (ref 70–99)
Glucose-Capillary: 180 mg/dL — ABNORMAL HIGH (ref 70–99)
Glucose-Capillary: 203 mg/dL — ABNORMAL HIGH (ref 70–99)
Glucose-Capillary: 228 mg/dL — ABNORMAL HIGH (ref 70–99)
Glucose-Capillary: 256 mg/dL — ABNORMAL HIGH (ref 70–99)

## 2020-02-18 LAB — BASIC METABOLIC PANEL
Anion gap: 26 — ABNORMAL HIGH (ref 5–15)
BUN: 68 mg/dL — ABNORMAL HIGH (ref 8–23)
CO2: 20 mmol/L — ABNORMAL LOW (ref 22–32)
Calcium: 6.9 mg/dL — ABNORMAL LOW (ref 8.9–10.3)
Chloride: 88 mmol/L — ABNORMAL LOW (ref 98–111)
Creatinine, Ser: 15.33 mg/dL — ABNORMAL HIGH (ref 0.61–1.24)
GFR, Estimated: 3 mL/min — ABNORMAL LOW (ref >60)
Glucose, Bld: 195 mg/dL — ABNORMAL HIGH (ref 70–99)
Potassium: 5.6 mmol/L — ABNORMAL HIGH (ref 3.5–5.1)
Sodium: 134 mmol/L — ABNORMAL LOW (ref 135–145)

## 2020-02-18 LAB — PHOSPHORUS: Phosphorus: 12.7 mg/dL — ABNORMAL HIGH (ref 2.5–4.6)

## 2020-02-18 LAB — POTASSIUM: Potassium: 5.8 mmol/L — ABNORMAL HIGH (ref 3.5–5.1)

## 2020-02-18 LAB — PROCALCITONIN: Procalcitonin: 6.32 ng/mL

## 2020-02-18 MED ORDER — MIDAZOLAM HCL 2 MG/2ML IJ SOLN
4.0000 mg | Freq: Once | INTRAMUSCULAR | Status: AC
  Administered 2020-02-18: 4 mg via INTRAVENOUS

## 2020-02-18 MED ORDER — INSULIN ASPART 100 UNIT/ML ~~LOC~~ SOLN
10.0000 [IU] | Freq: Once | SUBCUTANEOUS | Status: AC
  Administered 2020-02-18: 10 [IU] via INTRAVENOUS
  Filled 2020-02-18: qty 1

## 2020-02-18 MED ORDER — DELFLEX-LC/2.5% DEXTROSE 394 MOSM/L IP SOLN
INTRAPERITONEAL | Status: DC
  Filled 2020-02-18 (×3): qty 3000

## 2020-02-18 MED ORDER — NEPRO/CARBSTEADY PO LIQD
1000.0000 mL | ORAL | Status: DC
  Administered 2020-02-18 – 2020-02-22 (×5): 1000 mL

## 2020-02-18 MED ORDER — IOHEXOL 350 MG/ML SOLN
75.0000 mL | Freq: Once | INTRAVENOUS | Status: AC | PRN
  Administered 2020-02-18: 75 mL via INTRAVENOUS

## 2020-02-18 MED ORDER — POLYETHYLENE GLYCOL 3350 17 G PO PACK: 17.0000 g | PACK | Freq: Every day | ORAL | Status: DC

## 2020-02-18 MED ORDER — METHYLPREDNISOLONE SODIUM SUCC 40 MG IJ SOLR
20.0000 mg | Freq: Two times a day (BID) | INTRAMUSCULAR | Status: DC
  Administered 2020-02-18 – 2020-02-20 (×6): 20 mg via INTRAVENOUS
  Filled 2020-02-18 (×6): qty 1

## 2020-02-18 MED ORDER — DELFLEX-LC/1.5% DEXTROSE 344 MOSM/L IP SOLN
INTRAPERITONEAL | Status: DC
  Filled 2020-02-18 (×3): qty 3000

## 2020-02-18 MED ORDER — PROSOURCE TF PO LIQD
90.0000 mL | Freq: Every day | ORAL | Status: DC
  Administered 2020-02-18 – 2020-02-23 (×24): 90 mL

## 2020-02-18 MED ORDER — PROPOFOL 1000 MG/100ML IV EMUL: 0.0000 ug/kg/min | INTRAVENOUS | Status: DC

## 2020-02-18 MED ORDER — VECURONIUM BROMIDE 10 MG IV SOLR
10.0000 mg | INTRAVENOUS | Status: DC | PRN
  Administered 2020-02-18: 10 mg via INTRAVENOUS
  Filled 2020-02-18: qty 10

## 2020-02-18 MED ORDER — SODIUM CHLORIDE 0.9 % IV SOLN
3.0000 g | INTRAVENOUS | Status: DC
  Administered 2020-02-18 – 2020-02-22 (×5): 3 g via INTRAVENOUS
  Filled 2020-02-18: qty 3
  Filled 2020-02-18: qty 8
  Filled 2020-02-18 (×4): qty 3

## 2020-02-18 MED ORDER — DOCUSATE SODIUM 50 MG/5ML PO LIQD: 100.0000 mg | Freq: Two times a day (BID) | ORAL | Status: DC

## 2020-02-18 MED ORDER — VITAL HIGH PROTEIN PO LIQD: 1000.0000 mL | ORAL | Status: DC

## 2020-02-18 MED ORDER — RENA-VITE PO TABS
1.0000 | ORAL_TABLET | Freq: Every day | ORAL | Status: DC
  Administered 2020-02-18 – 2020-02-22 (×5): 1
  Filled 2020-02-18 (×5): qty 1

## 2020-02-18 MED ORDER — IPRATROPIUM-ALBUTEROL 0.5-2.5 (3) MG/3ML IN SOLN
3.0000 mL | RESPIRATORY_TRACT | Status: DC
  Administered 2020-02-18 – 2020-02-23 (×29): 3 mL via RESPIRATORY_TRACT
  Filled 2020-02-18 (×28): qty 3

## 2020-02-18 MED ORDER — SODIUM BICARBONATE 8.4 % IV SOLN
100.0000 meq | Freq: Once | INTRAVENOUS | Status: AC
  Administered 2020-02-18: 100 meq via INTRAVENOUS
  Filled 2020-02-18: qty 50

## 2020-02-18 MED ORDER — DEXTROSE 50 % IV SOLN
1.0000 | Freq: Once | INTRAVENOUS | Status: AC
  Administered 2020-02-18: 50 mL via INTRAVENOUS
  Filled 2020-02-18: qty 50

## 2020-02-18 NOTE — Progress Notes (Signed)
*  PRELIMINARY RESULTS* Echocardiogram 2D Echocardiogram has been performed.  Patrick Hurst 02/18/2020, 9:37 AM

## 2020-02-18 NOTE — Progress Notes (Addendum)
[facility]  ROUNDING NOTE   Subjective:   Patient stays intubated with FiO2 increased to 70% On Levophed 2mcg/mt He is sedated with fentanyl and Precedex OGT draining yellowish brown output tolerated PD last night with some machine alarms   Objective:  Vital signs in last 24 hours:  Temp:  [98.5 F (36.9 C)-99.7 F (37.6 C)] 98.5 F (36.9 C) (10/18 0903) Pulse Rate:  [61-129] 129 (10/18 0400) Resp:  [0-21] 20 (10/18 0500) BP: (66-153)/(34-93) 127/46 (10/18 0400) SpO2:  [96 %-98 %] 96 % (10/18 0400) Arterial Line BP: (79-156)/(52-84) 101/52 (10/18 0500) FiO2 (%):  [65 %-90 %] 65 % (10/18 1130) Weight:  [132.6 kg] 132.6 kg (10/18 0358)  Weight change: 0.6 kg Filed Weights   02/16/20 0442 02/17/20 0417 02/18/20 0358  Weight: 130.5 kg 132 kg 132.6 kg    Intake/Output: I/O last 3 completed shifts: In: 2435.9 [I.V.:2335.9; IV Piggyback:100] Out: 1200 [Emesis/NG output:1200]   Intake/Output this shift:  No intake/output data recorded.  Physical Exam: General:  Critically ill appearing  Head:  Normocephalic, atraumatic, +ETT, +OGT  Eyes:  Sclerae and conjunctivae clear  Lungs:   Lungs diminished at the bases,ventilator assisted,FiO2 70%  Heart:  Regular rate and rhythm, S1S2, no rubs or gallops  Abdomen:   Soft,non distended, obese  Extremities:  No peripheral edema.  Neurologic:  sedated  Skin: No acute lesions or rashes noted  Access: PD catheter, RIJ permcath, and left radiocephalic AVF +bruit     Basic Metabolic Panel: Recent Labs  Lab 03/01/2020 1228 02/05/2020 1228 02/15/20 0100 02/15/20 0100 02/15/20 0440 02/15/20 1409 02/15/20 1409 02/16/20 0444 02/17/20 0415 02/18/20 0351  NA 140   < > 141  --   --  137  --  139 136 134*  K 5.9*   < > 5.1  --   --  5.4*  --  4.6 4.9 5.6*  CL 85*   < > 87*  --   --  86*  --  92* 90* 88*  CO2 23   < > 22  --   --  21*  --  21* 20* 20*  GLUCOSE 316*   < > 169*  --   --  143*  --  157* 178* 195*  BUN 91*    < > 91*  --   --  90*  --  51* 61* 68*  CREATININE 19.17*   < > 18.82*  --   --  18.60*  --  12.34* 13.95* 15.33*  CALCIUM 7.3*   < > 6.5*   < >  --  6.4*   < > 7.3* 6.7* 6.9*  MG 2.8*  --   --   --  2.8*  --   --  2.3 2.0 2.3  PHOS 15.8*   < >  --   --  13.5* 13.5*  --  10.2* 10.0* 12.7*   < > = values in this interval not displayed.    Liver Function Tests: Recent Labs  Lab 02/25/2020 1228 02/15/20 1409  AST 17  --   ALT 14  --   ALKPHOS 73  --   BILITOT 1.0  --   PROT 7.4  --   ALBUMIN 3.2* 2.9*   No results for input(s): LIPASE, AMYLASE in the last 168 hours. No results for input(s): AMMONIA in the last 168 hours.  CBC: Recent Labs  Lab 02/13/2020 1228 02/04/2020 1228 02/15/20 0045 02/15/20 0045 02/15/20 0440 02/15/20 1409 02/16/20 0444 02/17/20   3382 02/18/20 0351  WBC 6.4   < > 6.6   < > 6.8 6.4 8.9 6.9 6.9  NEUTROABS 5.0  --  5.2  --   --   --  7.0 4.9 5.7  HGB 6.1*   < > 10.0*   < > 10.9* 8.4* 9.4* 8.0* 7.9*  HCT 19.4*   < > 30.5*   < > 33.3* 24.4* 27.9* 22.0* 23.5*  MCV 96.0   < > 93.3   < > 93.3 91.0 93.0 92.8 93.3  PLT 124*   < > 125*   < > 116* 123* 135* 92* 84*   < > = values in this interval not displayed.    Cardiac Enzymes: Recent Labs  Lab 02/27/2020 1228  CKMB 33.0*    BNP: Invalid input(s): POCBNP  CBG: Recent Labs  Lab 02/17/20 1940 02/17/20 2321 02/18/20 0346 02/18/20 0718 02/18/20 1234  GLUCAP 172* 156* 180* 203* 152*    Microbiology: Results for orders placed or performed during the hospital encounter of 02/10/2020  Culture, blood (routine x 2)     Status: None (Preliminary result)   Collection Time: 02/13/2020 12:29 PM   Specimen: BLOOD  Result Value Ref Range Status   Specimen Description BLOOD RIGHT ANTECUBITAL  Final   Special Requests   Final    BOTTLES DRAWN AEROBIC AND ANAEROBIC Blood Culture adequate volume   Culture   Final    NO GROWTH 4 DAYS Performed at [facility], [address] [place], [place]  [address]    Report Status PENDING  Incomplete  Culture, blood (routine x 2)     Status: None (Preliminary result)   Collection Time: 02/24/2020 12:30 PM   Specimen: BLOOD  Result Value Ref Range Status   Specimen Description BLOOD BLOOD RIGHT HAND  Final   Special Requests   Final    BOTTLES DRAWN AEROBIC AND ANAEROBIC Blood Culture adequate volume   Culture   Final    NO GROWTH 4 DAYS Performed at [facility], [address], [place], [place] [address]    Report Status PENDING  Incomplete  MRSA PCR Screening     Status: None   Collection Time: 02/22/2020 12:48 PM   Specimen: Nasal Mucosa; Nasopharyngeal  Result Value Ref Range Status   MRSA by PCR NEGATIVE NEGATIVE Final    Comment:        The GeneXpert MRSA Assay (FDA approved for NASAL specimens only), is one component of a comprehensive MRSA colonization surveillance program. It is not intended to diagnose MRSA infection nor to guide or monitor treatment for MRSA infections. Performed at [facility], [address] [place], [place] [address]     Coagulation Studies: No results for input(s): LABPROT, INR in the last 72 hours.  Urinalysis: No results for input(s): COLORURINE, LABSPEC, PHURINE, GLUCOSEU, HGBUR, BILIRUBINUR, KETONESUR, PROTEINUR, UROBILINOGEN, NITRITE, LEUKOCYTESUR in the last 72 hours.  Invalid input(s): APPERANCEUR    Imaging: DG Chest 1 View  Result Date: 02/18/2020 CLINICAL DATA:  Oxygen desaturation EXAM: CHEST  1 VIEW COMPARISON:  Yesterday FINDINGS: The endotracheal tube tip is at the clavicular heads. The enteric tube tip is not visualized beyond the lower esophagus. Dialysis catheter on the right with tip at the upper right atrium. Cardiopericardial enlargement. Increased density at the bases, especially on the left. No visible effusion or pneumothorax. These results will be called to the ordering clinician or representative by the Radiologist Assistant, and communication  documented in the PACS  or [employer]. IMPRESSION: 1. The enteric tube tip appears to terminate at the lower esophagus, but certainty is limited by patient anatomy. Consider advancement and KUB. 2. Cardiomegaly with mild interval lower lobe opacification, atelectasis or infiltrate. Electronically Signed   By: Monte Fantasia M.D.   On: 02/18/2020 05:50   DG Abd 1 View  Result Date: 02/18/2020 CLINICAL DATA:  OG tube placement EXAM: ABDOMEN - 1 VIEW COMPARISON:  02/24/2020 FINDINGS: Enteric tube tip is in the left upper quadrant consistent with location in the upper stomach. There has been advancement since the previous study. Right central venous catheter with tip over the cavoatrial junction region. Visualized bowel are not abnormally distended. Vascular calcifications in the upper abdomen. Old left rib fractures. Consolidation and air bronchograms in the left lung base. IMPRESSION: Enteric tube tip is been advanced and now in satisfactory apparent position. Electronically Signed   By: Lucienne Capers M.D.   On: 02/18/2020 06:48   MR BRAIN WO CONTRAST  Result Date: 02/17/2020 CLINICAL DATA:  Neuro deficit.  Stroke suspected. EXAM: MRI HEAD WITHOUT CONTRAST TECHNIQUE: Multiplanar, multiecho pulse sequences of the brain and surrounding structures were obtained without intravenous contrast. COMPARISON:  02/07/2020 head CT and prior.  03/13/2012 MRI head. FINDINGS: Brain: No diffusion-weighted signal abnormality. No intracranial hemorrhage. No midline shift, ventriculomegaly or extra-axial fluid collection. No mass lesion. Remote right basal ganglia insult. Vascular: Grossly preserved major intracranial flow voids. Skull and upper cervical spine: Normal marrow signal. Sinuses/Orbits: Sequela of bilateral lens replacement. Bilateral proptosis, unchanged. Mild to moderate pansinus mucosal thickening. Bilateral mastoid effusions. Other: None. IMPRESSION: No acute intracranial process. Remote right basal  ganglia insult. Electronically Signed   By: Primitivo Gauze M.D.   On: 02/17/2020 15:02   DG Chest Port 1 View  Result Date: 02/17/2020 CLINICAL DATA:  Respiratory failure EXAM: PORTABLE CHEST 1 VIEW COMPARISON:  02/15/2020 FINDINGS: Unchanged support apparatus. Shallow lung inflation without focal airspace consolidation. Mild cardiomegaly. IMPRESSION: Shallow lung inflation without focal airspace consolidation. Electronically Signed   By: Ulyses Jarred M.D.   On: 02/17/2020 06:21   ECHOCARDIOGRAM LIMITED  Result Date: 02/18/2020    ECHOCARDIOGRAM LIMITED REPORT   Patient Name:   Patrick Hurst Date of Exam: 02/18/2020 Medical Rec #:  [id number]      Height:       72.0 in Accession #:    [id number]     Weight:       292.3 lb Date of Birth:  1955/05/13      BSA:          2.504 m Patient Age:    64 years       BP:           Not listed in chart/Not listed in                                              chart mmHg Patient Gender: M              HR:           73 bpm. Exam Location:  [facility] Procedure: Cardiac Doppler, Color Doppler and Limited Echo Indications:     Cardiac Arrest 146.9  History:         Patient has prior history of Echocardiogram examinations, most  recent 02/27/2020. Risk Factors:Sleep Apnea. Chronic diastolic                  CHF.  Sonographer:     Sherrie Sport RDCS (AE) Referring Phys:  [place] Diagnosing Phys: Bartholome Bill MD IMPRESSIONS  1. Left ventricular ejection fraction, by estimation, is 40 to 45%. The left ventricle has mild to moderately decreased function. The left ventricle demonstrates global hypokinesis. There is moderate left ventricular hypertrophy.  2. Right ventricular systolic function is normal. The right ventricular size is mildly enlarged.  3. Left atrial size was mildly dilated.  4. The mitral valve was not well visualized. Trivial mitral valve regurgitation.  5. The aortic valve was not well visualized. Aortic valve regurgitation is  trivial. FINDINGS  Left Ventricle: Left ventricular ejection fraction, by estimation, is 40 to 45%. The left ventricle has mild to moderately decreased function. The left ventricle demonstrates global hypokinesis. There is moderate left ventricular hypertrophy. Right Ventricle: The right ventricular size is mildly enlarged. Right vetricular wall thickness was not well visualized. Right ventricular systolic function is normal. Left Atrium: Left atrial size was mildly dilated. Right Atrium: Right atrial size was normal in size. Pericardium: There is no evidence of pericardial effusion. Mitral Valve: The mitral valve was not well visualized. Trivial mitral valve regurgitation. Tricuspid Valve: The tricuspid valve is not well visualized. Tricuspid valve regurgitation is mild. Aortic Valve: The aortic valve was not well visualized. Aortic valve regurgitation is trivial. Pulmonic Valve: The pulmonic valve was not well visualized. Pulmonic valve regurgitation is trivial. IAS/Shunts: The interatrial septum was not assessed. LEFT VENTRICLE PLAX 2D LVIDd:         4.86 cm      Diastology LVIDs:         4.18 cm      LV e' medial:    5.66 cm/s LV PW:         1.81 cm      LV E/e' medial:  12.9 LV IVS:        2.12 cm      LV e' lateral:   11.90 cm/s LVOT diam:     2.30 cm      LV E/e' lateral: 6.1 LVOT Area:     4.15 cm  LV Volumes (MOD) LV vol d, MOD A2C: 162.0 ml LV vol d, MOD A4C: 178.0 ml LV vol s, MOD A2C: 84.5 ml LV vol s, MOD A4C: 111.0 ml LV SV MOD A2C:     77.5 ml LV SV MOD A4C:     178.0 ml LV SV MOD BP:      80.7 ml LEFT ATRIUM             Index       RIGHT ATRIUM           Index LA diam:        4.00 cm 1.60 cm/m  RA Area:     29.00 cm LA Vol (A2C):   75.3 ml 30.08 ml/m RA Volume:   85.70 ml  34.23 ml/m LA Vol (A4C):   96.6 ml 38.59 ml/m LA Biplane Vol: 88.2 ml 35.23 ml/m                        PULMONIC VALVE AORTA                 PV Vmax:        0.59 m/s Ao Root  diam: 3.30 cm PV Peak grad:   1.4 mmHg                        RVOT Peak grad: 3 mmHg  MITRAL VALVE               TRICUSPID VALVE MV Area (PHT): 2.52 cm    TR Peak grad:   14.6 mmHg MV Decel Time: 301 msec    TR Vmax:        191.00 cm/s MV E velocity: 72.80 cm/s MV A velocity: 49.00 cm/s  SHUNTS MV E/A ratio:  1.49        Systemic Diam: 2.30 cm Bartholome Bill MD Electronically signed by Bartholome Bill MD Signature Date/Time: 02/18/2020/1:21:04 PM    Final      Medications:   . sodium chloride    . sodium chloride 10 mL/hr at 02/15/20 0600  . ampicillin-sulbactam (UNASYN) IV    . dexmedetomidine (PRECEDEX) IV infusion 1 mcg/kg/hr (02/18/20 1252)  . dialysis solution 1.5% low-MG/low-CA    . famotidine (PEPCID) IV Stopped (02/17/20 1825)  . fentaNYL infusion INTRAVENOUS 100 mcg/hr (02/18/20 0631)  . norepinephrine (LEVOPHED) Adult infusion 1 mcg/min (02/18/20 1424)  . propofol (DIPRIVAN) infusion Stopped (02/17/20 1649)  . vasopressin 0.03 Units/min (02/18/20 0631)   . sodium chloride   Intravenous Once  . amiodarone  200 mg Per Tube BID  . chlorhexidine gluconate (MEDLINE KIT)  15 mL Mouth Rinse BID  . Chlorhexidine Gluconate Cloth  6 each Topical Daily  . docusate  100 mg Per Tube BID  . feeding supplement (NEPRO CARB STEADY)  1,000 mL Per Tube Q24H  . feeding supplement (PROSource TF)  90 mL Per Tube 5 X Daily  . fentaNYL (SUBLIMAZE) injection  100 mcg Intravenous Once  . gentamicin cream  1 application Topical Daily  . heparin  5,000 Units Subcutaneous Q8H  . insulin aspart  0-6 Units Subcutaneous Q4H  . ipratropium-albuterol  3 mL Nebulization Q4H  . mouth rinse  15 mL Mouth Rinse 10 times per day  . methylPREDNISolone (SOLU-MEDROL) injection  20 mg Intravenous Q12H  . multivitamin  1 tablet Per Tube QHS  . polyethylene glycol  17 g Per Tube Daily   docusate sodium, fentaNYL, midazolam, ondansetron (ZOFRAN) IV, polyethylene glycol  Assessment/ Plan:  Mr. DAVEYON KITCHINGS is a 64 y.o. white male with end stage renal disease on  peritoneal dialysis, hepatic cirrhosis, congestive heart failure, COPD, atrial fibrillation, depression, GERD, sleep apnea, and gout who was admitted after cardiac arrest prior to scheduled colonoscopy on 02/13/2020. Patient currently admitted to [facility] ICU requiring intubation, mechanical ventilation and vasopressors.   # ESRD on Peritoneal dialysis:  Patient received peritoneal dialysis on 02/10/2020, however was with poor clearance  -Peritoneal dialysis again  tonight;  -Will alternate between 1.5% and 2.5% dialysate Solution -We will continue monitoring  #Hyperkalemia Potassium 5.6 today  #Hypotension with cardiogenic shock - holding metoprolol and torsemide -Patient is on Norepinephrine, requiring pressor support  #Anemia with renal failure and GI bleed.  Received PRBC transfusion on 02/27/2020 Lab Results  Component Value Date   HGB 7.9 (L) 02/18/2020    Hematology and Gastro enterology teams involved in the care Receives Epogen 40,000 unit subcutaneous weekly as outpatient, last dose was on 02/11/2020 We will continue monitoring closely  #Secondary Hyperparathyroidism with hyperphosphatemia.  PTH Lab Results  Component Value Date   PTH 222 (H) 12/27/2019   CALCIUM 6.9 (L) 02/18/2020  CAION 0.97 (L) 11/08/2019   PHOS 12.7 (H) 02/18/2020    Severe hyperphosphatemia noted   # Acute respiratory failure Ventilator Dependent,FiO2 70% Management per ICU team Suspicion of aspiration pneumonia. ICU team started unasyn ICU team planning CT angiogram to evaluate for PE   LOS: 4 Princy Raju 10/18/20212:49 PM  

## 2020-02-18 NOTE — Progress Notes (Addendum)
Nutrition Follow-up  DOCUMENTATION CODES:   Obesity unspecified  INTERVENTION:  Initiate Nepro at 30 mL/hr (720 mL goal daily volume) + PROSource TF 90 mL 5 times daily per tube. Provides 1696 kcal, 168 grams of protein, 526 mL H2O daily.  Provide Rena-vite QHS per tube.  NUTRITION DIAGNOSIS:   Inadequate oral intake related to inability to eat (pt sedated and ventilated) as evidenced by NPO status.  Ongoing.  GOAL:   Provide needs based on ASPEN/SCCM guidelines  Progressing with initiation of tube feeds.  MONITOR:   Vent status, Labs, Weight trends, Skin, I & O's  REASON FOR ASSESSMENT:   Ventilator    ASSESSMENT:   64 y.o. white male with end stage renal disease on peritoneal dialysis, hepatic cirrhosis, congestive heart failure, COPD, atrial fibrillation, depression, GERD, sleep apnea, and gout who was admitted after cardiac arrest prior to scheduled colonoscopy on 02/17/2020  10/14 intubated  [facility] order from yesterday: 2 liters fill volume, 5 cycles per day, dwell time 10 hours  Patient is currently intubated on ventilator support MV: 12 L/min Temp (24hrs), Avg:99.2 F (37.3 C), Min:98.5 F (36.9 C), Max:99.7 F (37.6 C)  Medications reviewed and include: Colace 100 mg BID, Novolog 0-6 units Q4hrs, Solu-Medrol 20 mg Q12hrs IV, Miralax, Unasyn, Precedex gtt, famotidine, norepinephrine gtt at 2 mcg/min, vasopressin gtt stopped. Patient now off propofol gtt.  Labs reviewed: CBG 152-203, Sodium 134, Potassium 5.6, Chloride 88, CO2 20, BUN 68, Creatinine 15.33, Phosphorus 12.7.  I/O: pt anuric  Weight trend: 132.6 kg on 10/18; +2.4 kg from 10/14  Enteral Access: OGT placed 10/14; terminates in upper stomach per abdominal x-ray 10/18  Discussed with RN and on rounds. Plan is to start tube feeds today. Per RN likely will be able to wean off norepinephrine gtt today. Patient had PD last night but continues to have hyperkalemia.  Diet Order:   Diet Order             Diet NPO time specified  Diet effective now                EDUCATION NEEDS:   No education needs have been identified at this time  Skin:  Skin Assessment: Reviewed RN Assessment  Last BM:  Unknown/PTA  Height:   Ht Readings from Last 1 Encounters:  02/29/2020 6' (1.829 m)   Weight:   Wt Readings from Last 1 Encounters:  02/18/20 132.6 kg   Ideal Body Weight:  80.9 kg  BMI:  Body mass index is 39.65 kg/m.  Estimated Nutritional Needs:   Kcal:  1496-1904kcal/day  Protein:  >160g/day  Fluid:  >2.4L/day  Jacklynn Barnacle, MS, RD, LDN Pager number available on Amion

## 2020-02-18 NOTE — Progress Notes (Signed)
Pt transported to CT and back to CCU with no issues.

## 2020-02-18 NOTE — Progress Notes (Signed)
Patient Name: Patrick Hurst Date of Encounter: 02/18/2020  Hospital Problem List     Active Problems:   Cardiac arrest [facility])    Patient Profile     64 y.o. male with history of paroxysmal atrial fibrillation, HFpEF with an ejection fraction of 55 to 60%, hypertension, thyroid disease, diabetes, exogenous obesity, thrombocytopenia, end-stage renal disease who was admitted after presenting for GI work-up where he suffered a PEA cardiac arrest on October 14 during the procedure.  He had approximately 10 minutes of CPR and intubated and required echo ventilation...  Echocardiogram postarrest was read as showing preserved LV function with EF of 50 to 55% which is low normal.  No regional wall motion abnormalities.  Moderate LVH.  He was felt to have mild aortic sclerosis with no significant stenosis.  He was placed on code ice.  It is of note that he had previous admission in April of this year with acute hypoxic respiratory failure as well as symptomatic anemia.  Endoscopy team was present at the onset of the arrest stated the patient became bradycardiac followed by PEA.  As hemoglobin was 6.1.  He had sinus rhythm on the monitor.  He had had his amiodarone and Eliquis held prior to the procedure.  Troponin was mildly elevated and flat consistent with demand.  Did not appear to have have had an acute coronary event.  EKG at the time showed sinus rhythm with nonspecific ST-T wave changes.  Head CT at the time of arrest showed no mass or hemorrhage with no acute infarct.  Chest x-ray postintubation showed cardiomegaly with vascular congestion.  Laboratory showed a potassium of 5.9 with a creatinine of 19.17.  BNP was 116.9 patient remains intubated.  He has predominantly been in sinus rhythm although has had episodes of breakthrough A. fib with RVR.  Long discussion with patient's wife and son regarding the patient's hospital course .   Subjective   Intubated and sedated.   Inpatient Medications     . sodium chloride   Intravenous Once  . amiodarone  200 mg Per Tube BID  . chlorhexidine gluconate (MEDLINE KIT)  15 mL Mouth Rinse BID  . Chlorhexidine Gluconate Cloth  6 each Topical Daily  . docusate  100 mg Per Tube BID  . feeding supplement (NEPRO CARB STEADY)  1,000 mL Per Tube Q24H  . feeding supplement (PROSource TF)  90 mL Per Tube 5 X Daily  . fentaNYL (SUBLIMAZE) injection  100 mcg Intravenous Once  . gentamicin cream  1 application Topical Daily  . heparin  5,000 Units Subcutaneous Q8H  . insulin aspart  0-6 Units Subcutaneous Q4H  . ipratropium-albuterol  3 mL Nebulization Q4H  . mouth rinse  15 mL Mouth Rinse 10 times per day  . methylPREDNISolone (SOLU-MEDROL) injection  20 mg Intravenous Q12H  . multivitamin  1 tablet Per Tube QHS  . polyethylene glycol  17 g Per Tube Daily    Vital Signs    Vitals:   02/18/20 0358 02/18/20 0400 02/18/20 0500 02/18/20 0903  BP:  (!) 127/46    Pulse:  (!) 129    Resp:  20 20   Temp:   99.5 F (37.5 C) 98.5 F (36.9 C)  TempSrc:   Oral Oral  SpO2:  96%    Weight: 132.6 kg     Height:        Intake/Output Summary (Last 24 hours) at 02/18/2020 1344 Last data filed at 02/18/2020 0631 Gross per 24  hour  Intake 740.82 ml  Output 500 ml  Net 240.82 ml   Filed Weights   02/16/20 0442 02/17/20 0417 02/18/20 0358  Weight: 130.5 kg 132 kg 132.6 kg    Physical Exam     HEENT: normal.  Neck: Supple, no JVD, carotid bruits, or masses. Cardiac: RRR, no murmurs, rubs, or gallops. rally. GI: Soft, nontender, nondistended, BS + x 4. MS: no deformity or atrophy. Skin: warm and dry, no rash. Neuro:  Intubated and sedated  Labs    CBC Recent Labs    02/17/20 0415 02/18/20 0351  WBC 6.9 6.9  NEUTROABS 4.9 5.7  HGB 8.0* 7.9*  HCT 22.0* 23.5*  MCV 92.8 93.3  PLT 92* 84*   Basic Metabolic Panel Recent Labs    02/17/20 0415 02/18/20 0351  NA 136 134*  K 4.9 5.6*  CL 90* 88*  CO2 20* 20*  GLUCOSE 178* 195*  BUN  61* 68*  CREATININE 13.95* 15.33*  CALCIUM 6.7* 6.9*  MG 2.0 2.3  PHOS 10.0* 12.7*   Liver Function Tests Recent Labs    02/15/20 1409  ALBUMIN 2.9*   No results for input(s): LIPASE, AMYLASE in the last 72 hours. Cardiac Enzymes No results for input(s): CKTOTAL, CKMB, CKMBINDEX, TROPONINI in the last 72 hours. BNP No results for input(s): BNP in the last 72 hours. D-Dimer No results for input(s): DDIMER in the last 72 hours. Hemoglobin A1C No results for input(s): HGBA1C in the last 72 hours. Fasting Lipid Panel No results for input(s): CHOL, HDL, LDLCALC, TRIG, CHOLHDL, LDLDIRECT in the last 72 hours. Thyroid Function Tests No results for input(s): TSH, T4TOTAL, T3FREE, THYROIDAB in the last 72 hours.  Invalid input(s): FREET3  Telemetry    Sinus tach  ECG    nsr  Radiology    DG Chest 1 View  Result Date: 02/18/2020 CLINICAL DATA:  Oxygen desaturation EXAM: CHEST  1 VIEW COMPARISON:  Yesterday FINDINGS: The endotracheal tube tip is at the clavicular heads. The enteric tube tip is not visualized beyond the lower esophagus. Dialysis catheter on the right with tip at the upper right atrium. Cardiopericardial enlargement. Increased density at the bases, especially on the left. No visible effusion or pneumothorax. These results will be called to the ordering clinician or representative by the Radiologist Assistant, and communication documented in the PACS or [employer]. IMPRESSION: 1. The enteric tube tip appears to terminate at the lower esophagus, but certainty is limited by patient anatomy. Consider advancement and KUB. 2. Cardiomegaly with mild interval lower lobe opacification, atelectasis or infiltrate. Electronically Signed   By: Monte Fantasia M.D.   On: 02/18/2020 05:50   DG Abd 1 View  Result Date: 02/18/2020 CLINICAL DATA:  OG tube placement EXAM: ABDOMEN - 1 VIEW COMPARISON:  02/13/2020 FINDINGS: Enteric tube tip is in the left upper quadrant consistent  with location in the upper stomach. There has been advancement since the previous study. Right central venous catheter with tip over the cavoatrial junction region. Visualized bowel are not abnormally distended. Vascular calcifications in the upper abdomen. Old left rib fractures. Consolidation and air bronchograms in the left lung base. IMPRESSION: Enteric tube tip is been advanced and now in satisfactory apparent position. Electronically Signed   By: Lucienne Capers M.D.   On: 02/18/2020 06:48   DG Abd 1 View  Result Date: 02/11/2020 CLINICAL DATA:  OG tube placement EXAM: ABDOMEN - 1 VIEW COMPARISON:  July 26, 2019 FINDINGS: The enteric tube projects over  the gastric body. The visualized bowel gas pattern is nonspecific. IMPRESSION: The enteric tube projects over the gastric body. Electronically Signed   By: Constance Holster M.D.   On: 02/20/2020 17:55   CT HEAD WO CONTRAST  Result Date: 02/25/2020 CLINICAL DATA:  Status post cardiac arrest EXAM: CT HEAD WITHOUT CONTRAST TECHNIQUE: Contiguous axial images were obtained from the base of the skull through the vertex without intravenous contrast. COMPARISON:  March 13, 2012 head CT and brain MRI. FINDINGS: Brain: Ventricles and sulci are normal in size and configuration. There is no intracranial mass, hemorrhage, extra-axial fluid collection, or midline shift. There is preservation of gray-white differentiation without evident cerebral edema. Brain parenchyma appears unremarkable. No acute infarct evident. There is a mild area of deep sulcal asymmetry in the right temporal region compared to the left, stable and a presumed anatomic variant. Vascular: No hyperdense vessel. There is calcification in each carotid siphon region. Skull: The bony calvarium appears intact. Sinuses/Orbits: Visualized paranasal sinuses are clear. Orbits appear symmetric bilaterally. Other: Mastoid air cells are clear. IMPRESSION: Preservation of gray-white differentiation  without edema. No acute infarct. No mass or hemorrhage. Foci of arterial vascular calcification noted. Electronically Signed   By: Lowella Grip III M.D.   On:  13:03   MR BRAIN WO CONTRAST  Result Date: 02/17/2020 CLINICAL DATA:  Neuro deficit.  Stroke suspected. EXAM: MRI HEAD WITHOUT CONTRAST TECHNIQUE: Multiplanar, multiecho pulse sequences of the brain and surrounding structures were obtained without intravenous contrast. COMPARISON:  02/12/2020 head CT and prior.  03/13/2012 MRI head. FINDINGS: Brain: No diffusion-weighted signal abnormality. No intracranial hemorrhage. No midline shift, ventriculomegaly or extra-axial fluid collection. No mass lesion. Remote right basal ganglia insult. Vascular: Grossly preserved major intracranial flow voids. Skull and upper cervical spine: Normal marrow signal. Sinuses/Orbits: Sequela of bilateral lens replacement. Bilateral proptosis, unchanged. Mild to moderate pansinus mucosal thickening. Bilateral mastoid effusions. Other: None. IMPRESSION: No acute intracranial process. Remote right basal ganglia insult. Electronically Signed   By: Primitivo Gauze M.D.   On: 02/17/2020 15:02   DG Chest Port 1 View  Result Date: 02/17/2020 CLINICAL DATA:  Respiratory failure EXAM: PORTABLE CHEST 1 VIEW COMPARISON:  02/15/2020 FINDINGS: Unchanged support apparatus. Shallow lung inflation without focal airspace consolidation. Mild cardiomegaly. IMPRESSION: Shallow lung inflation without focal airspace consolidation. Electronically Signed   By: Ulyses Jarred M.D.   On: 02/17/2020 06:21   DG Chest Port 1 View  Result Date: 02/15/2020 CLINICAL DATA:  Respiratory failure. EXAM: PORTABLE CHEST 1 VIEW COMPARISON:  Chest x-ray 02/13/2020, CT chest 11/19/2019 FINDINGS: Endotracheal tube with tip approximately 8 cm above the carina. Enteric tube courses below diaphragm with tip and side port overlying the expected region the gastric lumen. PO contrast is noted  partially opacifying the gastric lumen. Right chest wall dialysis catheter again noted with tip overlying the right atrium with tip near the inferior cavoatrial junction a (tip likely more cranially within the right atrium; however, due to low lung volumes tip is near the junction). The heart size and mediastinal contours are unchanged. Low lung volumes. Vague patchy airspace opacities. Increased interstitial markings. No pleural effusion. No pneumothorax. No acute osseous abnormality. IMPRESSION: 1. Enteric tube with tip terminating 8 cm above the carina. Consider advancing by 2 cm. 2. Low lung volumes with vague patchy airspace opacities that likely represent atelectasis. 3. Cardiomegaly with mild pulmonary edema. Electronically Signed   By: Iven Finn M.D.   On: 02/15/2020 02:28   DG Chest [facility]  1 View  Result Date: 02/21/2020 CLINICAL DATA:  Intubation EXAM: PORTABLE CHEST 1 VIEW COMPARISON:  12/27/2019 FINDINGS: There is a well-positioned tunneled dialysis catheter on the right. The endotracheal tube terminates above the carina and just below the thoracic inlet. The tube terminates approximately 8 cm above the carina. The enteric tube appears to extend below the left hemidiaphragm. The heart size is stable but enlarged. The lung volumes are low. There is vascular congestion with small bilateral pleural effusions and bibasilar atelectasis. There is no acute osseous abnormality. IMPRESSION: 1. Lines and tubes as above. 2. Low lung volumes with bibasilar atelectasis and small bilateral pleural effusions. 3. Cardiomegaly with vascular congestion. Electronically Signed   By: Constance Holster M.D.   On: 02/19/2020 17:54   EEG adult  Result Date: 02/21/2020 Alexis Goodell, MD     02/16/2020  3:29 PM ELECTROENCEPHALOGRAM REPORT Patient: REVAN GENDRON       Room #: IC06A EEG No. ID: [id number] Age: 64 y.o.        Sex: male Requesting Physician: Kasa Report Date:  02/13/2020       Interpreting Physician:  Alexis Goodell History: KACPER CARTLIDGE is an 64 y.o. male with PEA arrest during colonoscopy Medications: Fentanyl, Propofol, Levophed, ASA, Conditions of Recording:  This is a 21 channel routine scalp EEG performed with bipolar and monopolar montages arranged in accordance to the international 10/20 system of electrode placement. One channel was dedicated to EKG recording. The patient is in the intubated and sedated state. Description:  The background activity is very low voltage and often obscured by low voltage artifact and electrocardiogram artifact.   At times some very slow polymorphic delta activity could be identified.  No epileptiform activity is noted.  The patient was stimulated during the recording with no activation of the background rhythm noted.  Hyperventilation and intermittent photic stimulation were not performed. IMPRESSION: This electroencephalogram is characterized by very slow, low voltage background activity that is consistent with a medication effect, but can not rule out a severe diffuse cerebral disturbance as well, etiologically nonspecific.  Clinical correlation recommended.  No epileptiform activity is noted.  Alexis Goodell, MD Neurology 02/05/2020, 2:43 PM   ECHOCARDIOGRAM COMPLETE  Result Date: 02/17/2020    ECHOCARDIOGRAM REPORT   Patient Name:   DAMETRIUS SANJUAN Date of Exam: 02/19/2020 Medical Rec #:  [id number]      Height:       72.0 in Accession #:    [id number]     Weight:       289.2 lb Date of Birth:  12-15-1955      BSA:          2.492 m Patient Age:    52 years       BP:           121/46 mmHg Patient Gender: M              HR:           88 bpm. Exam Location:  [facility] Procedure: 2D Echo, Cardiac Doppler and Color Doppler Indications:     Cardiac Arrest 146.9  History:         Patient has prior history of Echocardiogram examinations, most                  recent 11/16/2019. CHF.  Sonographer:     Sherrie Sport RDCS (AE) Referring Phys:  [id number] [place] L R[place]  Diagnosing Phys: Ida Rogue MD  Sonographer Comments:  Echo performed with patient supine and on artificial respirator. IMPRESSIONS  1. Left ventricular ejection fraction, by estimation, is 50 to 55%. The left ventricle has low normal function. The left ventricle has no regional wall motion abnormalities. There is moderate left ventricular hypertrophy. Indeterminate diastolic filling  due to E-A fusion.  2. The aortic valve was not well visualized. Aortic valve regurgitation is not visualized. Mild to moderate aortic valve sclerosis/calcification is present, without any evidence of aortic stenosis. FINDINGS  Left Ventricle: Left ventricular ejection fraction, by estimation, is 50 to 55%. The left ventricle has low normal function. The left ventricle has no regional wall motion abnormalities. The left ventricular internal cavity size was normal in size. There is moderate left ventricular hypertrophy. Indeterminate diastolic filling due to E-A fusion. Right Ventricle: The right ventricular size is normal. No increase in right ventricular wall thickness. Right ventricular systolic function is normal. Tricuspid regurgitation signal is inadequate for assessing PA pressure. Left Atrium: Left atrial size was normal in size. Right Atrium: Right atrial size was normal in size. Pericardium: There is no evidence of pericardial effusion. Mitral Valve: The mitral valve is normal in structure. Mild to moderate mitral annular calcification. No evidence of mitral valve regurgitation. No evidence of mitral valve stenosis. Tricuspid Valve: The tricuspid valve is normal in structure. Tricuspid valve regurgitation is not demonstrated. No evidence of tricuspid stenosis. Aortic Valve: The aortic valve was not well visualized. Aortic valve regurgitation is not visualized. Mild to moderate aortic valve sclerosis/calcification is present, without any evidence of aortic stenosis. Aortic valve mean gradient measures 5.5 mmHg.  Aortic valve  peak gradient measures 10.2 mmHg. Aortic valve area, by VTI measures 2.12 cm. Pulmonic Valve: The pulmonic valve was normal in structure. Pulmonic valve regurgitation is not visualized. No evidence of pulmonic stenosis. Aorta: The aortic root is normal in size and structure. Venous: The inferior vena cava is normal in size with greater than 50% respiratory variability, suggesting right atrial pressure of 3 mmHg. IAS/Shunts: No atrial level shunt detected by color flow Doppler.  LEFT VENTRICLE PLAX 2D LVIDd:         4.64 cm  Diastology LVIDs:         2.94 cm  LV e' medial:    7.29 cm/s LV PW:         1.74 cm  LV E/e' medial:  12.2 LV IVS:        1.94 cm  LV e' lateral:   4.46 cm/s LVOT diam:     2.30 cm  LV E/e' lateral: 19.9 LV SV:         58 LV SV Index:   23 LVOT Area:     4.15 cm  LEFT ATRIUM             Index       RIGHT ATRIUM           Index LA Vol (A2C):   71.8 ml 28.81 ml/m RA Area:     27.80 cm LA Vol (A4C):   47.2 ml 18.94 ml/m RA Volume:   99.70 ml  40.01 ml/m LA Biplane Vol: 61.2 ml 24.56 ml/m  AORTIC VALVE                    PULMONIC VALVE AV Area (Vmax):    2.03 cm     PV Vmax:        0.93 m/s AV Area (Vmean):   1.97 cm  PV Peak grad:   3.4 mmHg AV Area (VTI):     2.12 cm     RVOT Peak grad: 5 mmHg AV Vmax:           160.00 cm/s AV Vmean:          106.000 cm/s AV VTI:            0.272 m AV Peak Grad:      10.2 mmHg AV Mean Grad:      5.5 mmHg LVOT Vmax:         78.30 cm/s LVOT Vmean:        50.300 cm/s LVOT VTI:          0.139 m LVOT/AV VTI ratio: 0.51  AORTA Ao Root diam: 3.60 cm MITRAL VALVE               TRICUSPID VALVE MV Area (PHT): 2.48 cm    TR Peak grad:   14.4 mmHg MV Decel Time: 306 msec    TR Vmax:        190.00 cm/s MV E velocity: 88.80 cm/s MV A velocity: 47.50 cm/s  SHUNTS MV E/A ratio:  1.87        Systemic VTI:  0.14 m                            Systemic Diam: 2.30 cm Julien Nordmann MD Electronically signed by Julien Nordmann MD Signature Date/Time: 02/06/2020/4:14:37 PM     Final    ECHOCARDIOGRAM LIMITED  Result Date: 02/18/2020    ECHOCARDIOGRAM LIMITED REPORT   Patient Name:   RUDOLPH DAOUST Date of Exam: 02/18/2020 Medical Rec #:  [id number]      Height:       72.0 in Accession #:    [id number]     Weight:       292.3 lb Date of Birth:  09/13/1955      BSA:          2.504 m Patient Age:    63 years       BP:           Not listed in chart/Not listed in                                              chart mmHg Patient Gender: M              HR:           73 bpm. Exam Location:  [facility] Procedure: Cardiac Doppler, Color Doppler and Limited Echo Indications:     Cardiac Arrest 146.9  History:         Patient has prior history of Echocardiogram examinations, most                  recent 02/02/2020. Risk Factors:Sleep Apnea. Chronic diastolic                  CHF.  Sonographer:     Cristela Blue RDCS (AE) Referring Phys:  [id number] Dalia Heading Diagnosing Phys: Harold Hedge MD IMPRESSIONS  1. Left ventricular ejection fraction, by estimation, is 40 to 45%. The left ventricle has mild to moderately decreased function. The left ventricle demonstrates global hypokinesis. There is moderate left ventricular hypertrophy.  2. Right ventricular systolic function  is normal. The right ventricular size is mildly enlarged.  3. Left atrial size was mildly dilated.  4. The mitral valve was not well visualized. Trivial mitral valve regurgitation.  5. The aortic valve was not well visualized. Aortic valve regurgitation is trivial. FINDINGS  Left Ventricle: Left ventricular ejection fraction, by estimation, is 40 to 45%. The left ventricle has mild to moderately decreased function. The left ventricle demonstrates global hypokinesis. There is moderate left ventricular hypertrophy. Right Ventricle: The right ventricular size is mildly enlarged. Right vetricular wall thickness was not well visualized. Right ventricular systolic function is normal. Left Atrium: Left atrial size was mildly dilated. Right Atrium:  Right atrial size was normal in size. Pericardium: There is no evidence of pericardial effusion. Mitral Valve: The mitral valve was not well visualized. Trivial mitral valve regurgitation. Tricuspid Valve: The tricuspid valve is not well visualized. Tricuspid valve regurgitation is mild. Aortic Valve: The aortic valve was not well visualized. Aortic valve regurgitation is trivial. Pulmonic Valve: The pulmonic valve was not well visualized. Pulmonic valve regurgitation is trivial. IAS/Shunts: The interatrial septum was not assessed. LEFT VENTRICLE PLAX 2D LVIDd:         4.86 cm      Diastology LVIDs:         4.18 cm      LV e' medial:    5.66 cm/s LV PW:         1.81 cm      LV E/e' medial:  12.9 LV IVS:        2.12 cm      LV e' lateral:   11.90 cm/s LVOT diam:     2.30 cm      LV E/e' lateral: 6.1 LVOT Area:     4.15 cm  LV Volumes (MOD) LV vol d, MOD A2C: 162.0 ml LV vol d, MOD A4C: 178.0 ml LV vol s, MOD A2C: 84.5 ml LV vol s, MOD A4C: 111.0 ml LV SV MOD A2C:     77.5 ml LV SV MOD A4C:     178.0 ml LV SV MOD BP:      80.7 ml LEFT ATRIUM             Index       RIGHT ATRIUM           Index LA diam:        4.00 cm 1.60 cm/m  RA Area:     29.00 cm LA Vol (A2C):   75.3 ml 30.08 ml/m RA Volume:   85.70 ml  34.23 ml/m LA Vol (A4C):   96.6 ml 38.59 ml/m LA Biplane Vol: 88.2 ml 35.23 ml/m                        PULMONIC VALVE AORTA                 PV Vmax:        0.59 m/s Ao Root diam: 3.30 cm PV Peak grad:   1.4 mmHg                       RVOT Peak grad: 3 mmHg  MITRAL VALVE               TRICUSPID VALVE MV Area (PHT): 2.52 cm    TR Peak grad:   14.6 mmHg MV Decel Time: 301 msec    TR Vmax:  191.00 cm/s MV E velocity: 72.80 cm/s MV A velocity: 49.00 cm/s  SHUNTS MV E/A ratio:  1.49        Systemic Diam: 2.30 cm Bartholome Bill MD Electronically signed by Bartholome Bill MD Signature Date/Time: 02/18/2020/1:21:04 PM    Final     Assessment & Plan    63 yo male with a significant history of COPD, OSA, T2DM,  ESRD, CHF, AF presented to Endoscopy for an outpatient colonoscopy procedure.   During the procedure his heart rate began to drop and he PEA arrested requiring 10 minutes of CPR prior to achieving ROSC. He received epinephrine, atropine, calcium, sodium bicarb &D50. He was intubated during the code blue requiring mechanical ventilation.  Patient underwent code ice protocol.  He currently is intubated  IV amiodarone.  1.  Cardiac arrest-etiology of this is unclear.  Appear to be a PEA arrest.  Patient's high-sensitivity troponin was mildly elevated but relatively flat.  Does not appear to suggest an acute coronary syndrome event.  EKG also did not suggest acute coronary syndrome without ST elevation or to suggest this.  Patient did not have pericardial effusion.  No evidence of pneumothorax.  Electrolytes were stable.  Patient received prompt ACLS care.  Currently intubated and sedated.  Further evaluation of the patient's mental status can be carried out after he is extubated.  2.  Respiratory arrest-currently ventilator dependent.  Appreciate excellent care by ICU team. Conitnued vent support. Weaning as tolerated. Agree with chest ct today to evaluate for evidence of PE. GI has stated that pt can be anticoagulated with heparin if need be.   3.  Abnormal serum troponin-as per above, does not appear to be an acute coronary event.  Further assessment of this can be considered once he is recovered from this event.  Not a candidate for invasive evaluation at present.  Would continue with subcu heparin.  Was symptomatically anemic and required transfusion.  Would not use antiplatelet therapy or anticoagulation at present until this has been further assessed. Not a candidate for antiplatelet therapy from an ischemic [place].   4.  Atrial fibrillation-has had paroxysmal A. fib as an outpatient.  Was on Eliquis and amiodarone.  Had stopped the Eliquis and amiodarone prior to his EGD/colonoscopy.  Would  remain off of Eliquis due to concern over GI bleed.  Would convert from IV amiodarone to amiodarone 200 mg twice daily via his NG tube.  Will use IV boluses should he have breakthrough A. fib that is persistent.Will start heparin based on results of chest ct.   5.  End-stage renal disease-appreciate nephrology input.  Will likely require HD for some time.  Had been on PD as an outpatient.  6.  Anoxic encephalopathy.-Concern over this given length of CPR.  This can be further evaluated once he is been extubated.  7.  CHF-EF 50 to 55% by echo postarrest.  ECHO this am revealed EF of 40-45% but still global. No wall motiion abnromalitiesNo effusion.   Signed, Javier Docker Alley Neils MD 02/18/2020, 1:44 PM  Pager: [phone]

## 2020-02-18 NOTE — Progress Notes (Signed)
GOALS OF CARE DISCUSSION  The Clinical status was relayed to family in detail.  Updated and notified of patients medical condition.  Patient remains unresponsive and will not open eyes to command.    Patient is having a weak cough and struggling to remove secretions.   patient with increased WOB and using accessory muscles to breathe Explained to family course of therapy and the modalities     Patient with Progressive multiorgan failure with very low chance of meaningful recovery despite all aggressive and optimal medical therapy.  Family understands the situation but remains very hopeful.   Family are satisfied with Plan of action and management. All questions answered  Additional CC time 32 mins   Braian Tijerina Patricia Pesa, M.D.  [facility]  Medical Director [place] Director [facility] Cardio-Pulmonary Department

## 2020-02-18 NOTE — Telephone Encounter (Signed)
Patient's wife called to cancel 02/25/20 procedure ,Patient is currently in the hospital.

## 2020-02-18 NOTE — Progress Notes (Signed)
Patrick Darby, MD [address]  [place]  [place], [place] [address]  Main: [phone]  Fax: [phone] Pager: [phone]   Consultation  Referring Provider:     No ref. provider found Primary Care Physician:  [name], MD Primary Gastroenterologist:  Dr. Sherri Sear         Reason for Consultation:     Cardiac arrest  Date of Admission:  02/21/2020 Date of Consultation:  02/18/2020         HPI:   Patrick Hurst is a 64 y.o. male with multiple comorbidities as listed below, significant for ESRD on peritoneal dialysis, diastolic heart failure, A. fib on Eliquis, anemia of chronic disease, history of melena, metabolic syndrome, compensated cirrhosis who originally presented on 10/14 for outpatient colonoscopy for age-appropriate colon cancer screening.  During the procedure, patient developed hypoxic respiratory failure, followed by bradycardia and PEA arrest, CPR for 10 minutes with ROSC, intubated and transferred to ICU.  Over the course of ICU admission, patient remained intubated, peritoneal dialysis was initiated, underwent EEG, CT and MRI of the brain with no acute intracranial pathology identified, no evidence of epileptiform activity noted.  He developed A. fib with RVR, closely followed by cardiology in the ICU, currently on amiodarone drip, rate controlled, on low-dose Levophed as well as vasopressin.  Patient required high oxygen needs this morning as he desaturated requiring 90% FiO2, currently maintaining saturations on 65% FiO2.  Continues to be sedated, with not much improvement in mental status.  OG tube has been placed and plan to initiate tube feeds today.  There was no evidence of melena, rectal bleeding.  Anticoagulation has been deferred by cardiology due to concern for recent episode of GI bleed.  Patient was admitted in 12/2019 secondary to hypoxic respiratory failure, also had melena, underwent upper endoscopy which was unremarkable as well as  colonoscopy, poor prep, otherwise no active bleeding identified.  Patient's hemoglobin was 6 post cardiac arrest  Eliquis has been held 3 days prior to most recent colonoscopy  Past Medical History:  Diagnosis Date  . Acute on chronic diastolic CHF (congestive heart failure) ([place]) 06/21/2018  . Acute respiratory failure with hypoxia ([place]) 06/21/2018  . Bell palsy 02/12/2015  . Carpal tunnel syndrome 02/12/2015  . Dependence on nocturnal oxygen therapy    2 LITERS WITH BIPAP  . Depression   . Gastric ulcer   . GERD (gastroesophageal reflux disease)   . Gout   . History of chicken pox   . Multifocal pneumonia 03/04/2019  . Sleep apnea treated with nocturnal BiPAP    uses bipap    Past Surgical History:  Procedure Laterality Date  . AV FISTULA PLACEMENT Left 11/08/2019   Procedure: ARTERIOVENOUS (AV) FISTULA CREATION (RADIALCEPHALIC);  Surgeon: Algernon Huxley, MD;  Location: [facility] ORS;  Service: Vascular;  Laterality: Left;  . CATARACT EXTRACTION Bilateral 2014 and 2015  . CHOLECYSTECTOMY  04/28/2011   Laproscopic; Dr. Pat Patrick  . COLONOSCOPY WITH PROPOFOL N/A 12/30/2019   Procedure: COLONOSCOPY WITH PROPOFOL;  Surgeon: Lin Landsman, MD;  Location: [facility] ENDOSCOPY;  Service: Gastroenterology;  Laterality: N/A;  . DIALYSIS/PERMA CATHETER INSERTION N/A 05/24/2019   Procedure: DIALYSIS/PERMA CATHETER INSERTION;  Surgeon: Algernon Huxley, MD;  Location: [place] CV LAB;  Service: Cardiovascular;  Laterality: N/A;  . DIALYSIS/PERMA CATHETER INSERTION N/A 10/09/2019   Procedure: DIALYSIS/PERMA CATHETER INSERTION;  Surgeon: Katha Cabal, MD;  Location: [place] CV LAB;  Service: Cardiovascular;  Laterality:  N/A;  . ESOPHAGOGASTRODUODENOSCOPY (EGD) WITH PROPOFOL N/A 12/29/2019   Procedure: ESOPHAGOGASTRODUODENOSCOPY (EGD) WITH PROPOFOL;  Surgeon: Lin Landsman, MD;  Location: [place];  Service: Gastroenterology;  Laterality: N/A;  . EYE SURGERY    . GALLBLADDER SURGERY      . LASIK Bilateral 2019   medical  . SHOULDER SURGERY Right 2012   Dr. Leanor Kail    Current Facility-Administered Medications:  .  0.9 %  sodium chloride infusion (Manually program via Guardrails IV Fluids), , Intravenous, Once, Rust-Chester, Britton L, NP .  0.9 %  sodium chloride infusion, 250 mL, Intravenous, Continuous, Rust-Chester, Britton L, NP .  0.9 %  sodium chloride infusion, , Intravenous, Continuous, Rust-Chester, Britton L, NP, Last Rate: 10 mL/hr at 02/15/20 0600, Rate Verify at 02/15/20 0600 .  amiodarone (PACERONE) tablet 200 mg, 200 mg, Per Tube, BID, Teodoro Spray, MD, 200 mg at 02/18/20 1102 .  Ampicillin-Sulbactam (UNASYN) 3 g in sodium chloride 0.9 % 100 mL IVPB, 3 g, Intravenous, Q24H, Kasa, Kurian, MD .  chlorhexidine gluconate (MEDLINE KIT) (PERIDEX) 0.12 % solution 15 mL, 15 mL, Mouth Rinse, BID, Kasa, Kurian, MD, 15 mL at 02/18/20 0855 .  Chlorhexidine Gluconate Cloth 2 % PADS 6 each, 6 each, Topical, Daily, Flora Lipps, MD, 6 each at 02/17/20 1625 .  dexmedetomidine (PRECEDEX) 400 MCG/100ML (4 mcg/mL) infusion, 0.4-1.2 mcg/kg/hr, Intravenous, Titrated, Aleskerov, Fuad, MD, Last Rate: 33 mL/hr at 02/18/20 1252, 1 mcg/kg/hr at 02/18/20 1252 .  dialysis solution 1.5% low-MG/low-CA dianeal solution, , Intraperitoneal, Q24H, Singh, Harmeet, MD .  docusate (COLACE) 50 MG/5ML liquid 100 mg, 100 mg, Per Tube, BID, Mortimer Fries, Kurian, MD, 100 mg at 02/18/20 1102 .  docusate sodium (COLACE) capsule 100 mg, 100 mg, Oral, BID PRN, Rust-Chester, Britton L, NP .  famotidine (PEPCID) IVPB 20 mg premix, 20 mg, Intravenous, Q24H, Flora Lipps, MD, Stopped at 02/17/20 1825 .  feeding supplement (NEPRO CARB STEADY) liquid 1,000 mL, 1,000 mL, Per Tube, Q24H, Kasa, Kurian, MD .  feeding supplement (PROSource TF) liquid 90 mL, 90 mL, Per Tube, 5 X Daily, Kasa, Kurian, MD .  fentaNYL (SUBLIMAZE) bolus via infusion 50 mcg, 50 mcg, Intravenous, Q30 min PRN, Rust-Chester, Britton L, NP .   fentaNYL (SUBLIMAZE) injection 100 mcg, 100 mcg, Intravenous, Once, Rust-Chester, Britton L, NP .  fentaNYL 2548mg in NS 2561m(1014mml) infusion-PREMIX, 100-300 mcg/hr, Intravenous, Continuous, Rust-Chester, Britton L, NP, Last Rate: 10 mL/hr at 02/18/20 0631, 100 mcg/hr at 02/18/20 0631 .  gentamicin cream (GARAMYCIN) 0.1 % 1 application, 1 application, Topical, Daily, KasFlora LippsD, 1 application at 10/73/53/2903 .  heparin injection 5,000 Units, 5,000 Units, Subcutaneous, Q8H, Rust-Chester, Britton L, NP, 5,000 Units at 02/18/20 0611 .  insulin aspart (novoLOG) injection 0-6 Units, 0-6 Units, Subcutaneous, Q4H, KasFlora LippsD, 1 Units at 02/18/20 1243 .  ipratropium-albuterol (DUONEB) 0.5-2.5 (3) MG/3ML nebulizer solution 3 mL, 3 mL, Nebulization, Q4H, Kasa, Kurian, MD, 3 mL at 02/18/20 1128 .  MEDLINE mouth rinse, 15 mL, Mouth Rinse, 10 times per day, KasFlora LippsD, 15 mL at 02/18/20 1248 .  methylPREDNISolone sodium succinate (SOLU-MEDROL) 40 mg/mL injection 20 mg, 20 mg, Intravenous, Q12H, Kasa, Kurian, MD, 20 mg at 02/18/20 1246 .  midazolam (VERSED) injection 2 mg, 2 mg, Intravenous, Q2H PRN, Rust-Chester, Britton L, NP, 2 mg at 02/17/20 1526 .  multivitamin (RENA-VIT) tablet 1 tablet, 1 tablet, Per Tube, QHS, Kasa, Kurian, MD .  norepinephrine (LEVOPHED) 4mg10m 250mL47mmix infusion, 0-50 mcg/min,  Intravenous, Titrated, Rust-Chester, Huel Cote, NP, Stopped at 02/18/20 [phone] .  ondansetron (ZOFRAN) injection 4 mg, 4 mg, Intravenous, Q6H PRN, Rust-Chester, Britton L, NP .  polyethylene glycol (MIRALAX / GLYCOLAX) packet 17 g, 17 g, Per Tube, Daily, Kasa, Kurian, MD, 17 g at 02/18/20 1102 .  polyethylene glycol (MIRALAX / GLYCOLAX) packet 17 g, 17 g, Per Tube, Daily PRN, Mortimer Fries, Kurian, MD .  propofol (DIPRIVAN) 1000 MG/100ML infusion, 25-80 mcg/kg/min, Intravenous, Continuous, Rust-Chester, Huel Cote, NP, Stopped at 02/17/20 1649 .  vasopressin (PITRESSIN) 20 Units in sodium chloride  0.9 % 100 mL infusion-*FOR SHOCK*, 0-0.03 Units/min, Intravenous, Continuous, Aleskerov, Fuad, MD, Last Rate: 9 mL/hr at 02/18/20 0631, 0.03 Units/min at 02/18/20 0631   Family History  Problem Relation Age of Onset  . COPD Mother   . Cancer Mother   . Heart disease Father      Social History   Tobacco Use  . Smoking status: Former Smoker    Packs/day: 1.00    Years: 15.00    Pack years: 15.00    Types: Cigarettes    Quit date: 05/03/1978    Years since quitting: 41.8  . Smokeless tobacco: Never Used  . Tobacco comment: started smoking at age 75  Vaping Use  . Vaping Use: Never used  Substance Use Topics  . Alcohol use: No    Alcohol/week: 0.0 standard drinks  . Drug use: No    Allergies as of 01/31/2020 - Review Complete 01/30/2020  Allergen Reaction Noted  . Mucinex [guaifenesin er] Swelling 02/12/2015  . Levaquin [levofloxacin] Palpitations 05/14/2015    Review of Systems:    All systems reviewed and negative except where noted in HPI.   Physical Exam:  Vital signs in last 24 hours: Temp:  [98.5 F (36.9 C)-99.7 F (37.6 C)] 98.5 F (36.9 C) (10/18 0903) Pulse Rate:  [61-129] 129 (10/18 0400) Resp:  [0-21] 20 (10/18 0500) BP: (66-153)/(34-93) 127/46 (10/18 0400) SpO2:  [96 %-98 %] 96 % (10/18 0400) Arterial Line BP: (79-156)/(52-84) 101/52 (10/18 0500) FiO2 (%):  [65 %-90 %] 65 % (10/18 1130) Weight:  [132.6 kg] 132.6 kg (10/18 0358)   General: Sedated, intubated Head:  Normocephalic and atraumatic. Eyes:   No icterus.   Conjunctiva pink. PERRLA. Ears:  Normal auditory acuity. Neck:  Supple; no masses or thyroidomegaly Lungs: Respirations even and unlabored. Lungs clear to auscultation bilaterally.   No wheezes, crackles, or rhonchi.  Heart:  Regular rate and rhythm;  Without murmur, clicks, rubs or gallops Abdomen:  Soft, obese, nondistended, nontender. Normal bowel sounds. No appreciable masses or hepatomegaly.  No rebound or guarding.  Rectal:  Not  performed. Msk:  Symmetrical without gross deformities. Extremities: 2+ edema, no cyanosis or clubbing. Neurologic: Sedated Skin:  Intact without significant lesions or rashes.  LAB RESULTS: CBC Latest Ref Rng & Units 02/18/2020 02/17/2020 02/16/2020  WBC 4.0 - 10.5 K/uL 6.9 6.9 8.9  Hemoglobin 13.0 - 17.0 g/dL 7.9(L) 8.0(L) 9.4(L)  Hematocrit 39 - 52 % 23.5(L) 22.0(L) 27.9(L)  Platelets 150 - 400 K/uL 84(L) 92(L) 135(L)    BMET BMP Latest Ref Rng & Units 02/18/2020 02/17/2020 02/16/2020  Glucose 70 - 99 mg/dL 195(H) 178(H) 157(H)  BUN 8 - 23 mg/dL 68(H) 61(H) 51(H)  Creatinine 0.61 - 1.24 mg/dL 15.33(H) 13.95(H) 12.34(H)  BUN/Creat Ratio 10 - 24 - - -  Sodium 135 - 145 mmol/L 134(L) 136 139  Potassium 3.5 - 5.1 mmol/L 5.6(H) 4.9 4.6  Chloride 98 - 111 mmol/L  88(L) 90(L) 92(L)  CO2 22 - 32 mmol/L 20(L) 20(L) 21(L)  Calcium 8.9 - 10.3 mg/dL 6.9(L) 6.7(L) 7.3(L)    LFT Hepatic Function Latest Ref Rng & Units 02/15/2020 02/12/2020 01/01/2020  Total Protein 6.5 - 8.1 g/dL - 7.4 6.4(L)  Albumin 3.5 - 5.0 g/dL 2.9(L) 3.2(L) 2.7(L)  AST 15 - 41 U/L - 17 23  ALT 0 - 44 U/L - 14 22  Alk Phosphatase 38 - 126 U/L - 73 92  Total Bilirubin 0.3 - 1.2 mg/dL - 1.0 0.8  Bilirubin, Direct 0.00 - 0.40 mg/dL - - -     STUDIES: DG Chest 1 View  Result Date: 02/18/2020 CLINICAL DATA:  Oxygen desaturation EXAM: CHEST  1 VIEW COMPARISON:  Yesterday FINDINGS: The endotracheal tube tip is at the clavicular heads. The enteric tube tip is not visualized beyond the lower esophagus. Dialysis catheter on the right with tip at the upper right atrium. Cardiopericardial enlargement. Increased density at the bases, especially on the left. No visible effusion or pneumothorax. These results will be called to the ordering clinician or representative by the Radiologist Assistant, and communication documented in the PACS or [employer]. IMPRESSION: 1. The enteric tube tip appears to terminate at the lower  esophagus, but certainty is limited by patient anatomy. Consider advancement and KUB. 2. Cardiomegaly with mild interval lower lobe opacification, atelectasis or infiltrate. Electronically Signed   By: Monte Fantasia M.D.   On: 02/18/2020 05:50   DG Abd 1 View  Result Date: 02/18/2020 CLINICAL DATA:  OG tube placement EXAM: ABDOMEN - 1 VIEW COMPARISON:  02/28/2020 FINDINGS: Enteric tube tip is in the left upper quadrant consistent with location in the upper stomach. There has been advancement since the previous study. Right central venous catheter with tip over the cavoatrial junction region. Visualized bowel are not abnormally distended. Vascular calcifications in the upper abdomen. Old left rib fractures. Consolidation and air bronchograms in the left lung base. IMPRESSION: Enteric tube tip is been advanced and now in satisfactory apparent position. Electronically Signed   By: Lucienne Capers M.D.   On: 02/18/2020 06:48   MR BRAIN WO CONTRAST  Result Date: 02/17/2020 CLINICAL DATA:  Neuro deficit.  Stroke suspected. EXAM: MRI HEAD WITHOUT CONTRAST TECHNIQUE: Multiplanar, multiecho pulse sequences of the brain and surrounding structures were obtained without intravenous contrast. COMPARISON:  02/17/2020 head CT and prior.  03/13/2012 MRI head. FINDINGS: Brain: No diffusion-weighted signal abnormality. No intracranial hemorrhage. No midline shift, ventriculomegaly or extra-axial fluid collection. No mass lesion. Remote right basal ganglia insult. Vascular: Grossly preserved major intracranial flow voids. Skull and upper cervical spine: Normal marrow signal. Sinuses/Orbits: Sequela of bilateral lens replacement. Bilateral proptosis, unchanged. Mild to moderate pansinus mucosal thickening. Bilateral mastoid effusions. Other: None. IMPRESSION: No acute intracranial process. Remote right basal ganglia insult. Electronically Signed   By: Primitivo Gauze M.D.   On: 02/17/2020 15:02   DG Chest Port 1  View  Result Date: 02/17/2020 CLINICAL DATA:  Respiratory failure EXAM: PORTABLE CHEST 1 VIEW COMPARISON:  02/15/2020 FINDINGS: Unchanged support apparatus. Shallow lung inflation without focal airspace consolidation. Mild cardiomegaly. IMPRESSION: Shallow lung inflation without focal airspace consolidation. Electronically Signed   By: Ulyses Jarred M.D.   On: 02/17/2020 06:21   ECHOCARDIOGRAM LIMITED  Result Date: 02/18/2020    ECHOCARDIOGRAM LIMITED REPORT   Patient Name:   Patrick Hurst Date of Exam: 02/18/2020 Medical Rec #:  [id number]      Height:  72.0 in Accession #:    [id number]     Weight:       292.3 lb Date of Birth:  06-Nov-1955      BSA:          2.504 m Patient Age:    63 years       BP:           Not listed in chart/Not listed in                                              chart mmHg Patient Gender: M              HR:           73 bpm. Exam Location:  [facility] Procedure: Cardiac Doppler, Color Doppler and Limited Echo Indications:     Cardiac Arrest 146.9  History:         Patient has prior history of Echocardiogram examinations, most                  recent 03/01/2020. Risk Factors:Sleep Apnea. Chronic diastolic                  CHF.  Sonographer:     Sherrie Sport RDCS (AE) Referring Phys:  [place] Diagnosing Phys: Bartholome Bill MD IMPRESSIONS  1. Left ventricular ejection fraction, by estimation, is 40 to 45%. The left ventricle has mild to moderately decreased function. The left ventricle demonstrates global hypokinesis. There is moderate left ventricular hypertrophy.  2. Right ventricular systolic function is normal. The right ventricular size is mildly enlarged.  3. Left atrial size was mildly dilated.  4. The mitral valve was not well visualized. Trivial mitral valve regurgitation.  5. The aortic valve was not well visualized. Aortic valve regurgitation is trivial. FINDINGS  Left Ventricle: Left ventricular ejection fraction, by estimation, is 40 to 45%. The left ventricle  has mild to moderately decreased function. The left ventricle demonstrates global hypokinesis. There is moderate left ventricular hypertrophy. Right Ventricle: The right ventricular size is mildly enlarged. Right vetricular wall thickness was not well visualized. Right ventricular systolic function is normal. Left Atrium: Left atrial size was mildly dilated. Right Atrium: Right atrial size was normal in size. Pericardium: There is no evidence of pericardial effusion. Mitral Valve: The mitral valve was not well visualized. Trivial mitral valve regurgitation. Tricuspid Valve: The tricuspid valve is not well visualized. Tricuspid valve regurgitation is mild. Aortic Valve: The aortic valve was not well visualized. Aortic valve regurgitation is trivial. Pulmonic Valve: The pulmonic valve was not well visualized. Pulmonic valve regurgitation is trivial. IAS/Shunts: The interatrial septum was not assessed. LEFT VENTRICLE PLAX 2D LVIDd:         4.86 cm      Diastology LVIDs:         4.18 cm      LV e' medial:    5.66 cm/s LV PW:         1.81 cm      LV E/e' medial:  12.9 LV IVS:        2.12 cm      LV e' lateral:   11.90 cm/s LVOT diam:     2.30 cm      LV E/e' lateral: 6.1 LVOT Area:     4.15 cm  LV Volumes (MOD)  LV vol d, MOD A2C: 162.0 ml LV vol d, MOD A4C: 178.0 ml LV vol s, MOD A2C: 84.5 ml LV vol s, MOD A4C: 111.0 ml LV SV MOD A2C:     77.5 ml LV SV MOD A4C:     178.0 ml LV SV MOD BP:      80.7 ml LEFT ATRIUM             Index       RIGHT ATRIUM           Index LA diam:        4.00 cm 1.60 cm/m  RA Area:     29.00 cm LA Vol (A2C):   75.3 ml 30.08 ml/m RA Volume:   85.70 ml  34.23 ml/m LA Vol (A4C):   96.6 ml 38.59 ml/m LA Biplane Vol: 88.2 ml 35.23 ml/m                        PULMONIC VALVE AORTA                 PV Vmax:        0.59 m/s Ao Root diam: 3.30 cm PV Peak grad:   1.4 mmHg                       RVOT Peak grad: 3 mmHg  MITRAL VALVE               TRICUSPID VALVE MV Area (PHT): 2.52 cm    TR Peak grad:    14.6 mmHg MV Decel Time: 301 msec    TR Vmax:        191.00 cm/s MV E velocity: 72.80 cm/s MV A velocity: 49.00 cm/s  SHUNTS MV E/A ratio:  1.49        Systemic Diam: 2.30 cm Bartholome Bill MD Electronically signed by Bartholome Bill MD Signature Date/Time: 02/18/2020/1:21:04 PM    Final       Impression / Plan:   Patrick Hurst is a 64 y.o. male with metabolic syndrome, OSA, history of hypoxic respiratory failure, A. fib on Eliquis, diastolic heart failure, ESRD on peritoneal dialysis, compensated cirrhosis of liver who unfortunately developed hypoxic respiratory failure followed by cardiac arrest during colonoscopy.  Patient is currently intubated, on low-dose pressors, unresponsive  I personally had a lengthy discussion with patient's wife, Terance Pomplun regarding the chain of unexpected events that occurred since colonoscopy unfortunately.  She does understand that her husband has multiple comorbidities and he was at high risk for the procedure to begin with.  She is hopeful that he might get extubated.  She also expressed to me that patient's wishes were not to prolong his life if he becomes vent dependent and unable to be extubated.  With regards to anticoagulation, patient has history of anemia of chronic disease.  He did have episodes of melena during previous admission while on Eliquis, bidirectional endoscopy did not reveal active bleeding source.  He was supposed to undergo video capsule endoscopy, currently postponed.  He is not actively bleeding at this time.  Therefore, patient can be restarted on anticoagulation such as therapeutic heparin with close monitoring for symptoms of active GI bleed if deemed necessary per cardiology.  Discussed the same with Dr. Mortimer Fries as well as Dr. Ubaldo Glassing    LOS: 4 days   Sherri Sear, MD  02/18/2020, 1:31 PM   Note: This dictation was prepared with Dragon dictation  along with smaller phrase technology. Any transcriptional errors that result from this process  are unintentional.

## 2020-02-18 NOTE — Telephone Encounter (Signed)
Called Trish and canceled the Capsule study.

## 2020-02-18 NOTE — Progress Notes (Addendum)
CRITICAL CARE NOTE 64 yo male presented for outpatient colonoscopy PEA cardiac arrested during the procedure, s/p 10 minutes CPR, intubated requiring mechanical ventilation with Targeted Temperature Management admitted to the ICU.  History of present illness   64 yo male with a significant history of COPD, OSA, T2DM, ESRD, CHF, AF presented to Endoscopy for an outpatient colonoscopy procedure.  In the middle of the procedure his heart rate began to drop and he PEA arrested requiring 10 minutes of CPR prior to achieving ROSC.  He received epinephrine, atropine, calcium, sodium bicarb & D50.  He was intubated during the code blue requiring mechanical ventilation.   He had a recent admit in August 2021 for acute hypoxic respiratory failure and symptomatic anemia supported on BIPAP and completed a work up with heme/onc and GI.  This procedure was to complete this anemia work up that had been initiated during that hospitalization. The patient was unresponsive post arrest and the decision was made to activate TTM.  PCCM was consulted for further management and the patient was transferred to the ICU.   Significant Hospital Events    10/14 Code Blue in Endo procedure, intubated, admitted to ICU for TTM 02/15/20- patient evaluated at bedside. Spoke to wife at length and reviewed various testing and diagnostics as well as therapy received and short term care plan. Levophed at 5mcg/kg/min.  S/p PD therapy this am. Patient with atelectasis on CXR this am, weaning FiO2 on MV with plan for SBT in am.  Discussed care plan with renal team - Dr Kolluru considering HD.  02/16/20- patient did not pass breathing trial today, had family meeting with son and wife at bedside with RN present throughout. Plan to re-attempt SBT in AM.  02/17/20- patient had increased O2 requirement overnight and is more edematous with anasarca on exam this morning.  I met with family and explained patient with numerous serious comorbid  conditions and overall very poor prognosis long term.  He was seen by cardiology and nephrology today.  Awakening trial with no appreciable improvement in GCS, today plan to repeat SBT if FiO2 can be weaned to <40% and perform MRI brain w/wo to evaluate for ischemic changes post PEA arrest.  10/18 severe hypoxia, multiorgan failure   CC  follow up respiratory failure  SUBJECTIVE Patient remains critically ill Prognosis is guarded Severe hypoxia  Vent Mode: PRVC FiO2 (%):  [50 %-90 %] 90 % Set Rate:  [20 bmp-24 bmp] 24 bmp Vt Set:  [550 mL] 550 mL PEEP:  [5 cmH20-10 cmH20] 10 cmH20 Plateau Pressure:  [21 cmH20] 21 cmH20  CBC    Component Value Date/Time   WBC 6.9 02/18/2020 0351   RBC 2.52 (L) 02/18/2020 0351   HGB 7.9 (L) 02/18/2020 0351   HGB 8.5 (L) 11/29/2019 1559   HCT 23.5 (L) 02/18/2020 0351   HCT 24.3 (L) 11/29/2019 1559   PLT 84 (L) 02/18/2020 0351   PLT 138 (L) 11/29/2019 1559   MCV 93.3 02/18/2020 0351   MCV 86 11/29/2019 1559   MCV 85 05/04/2013 1240   MCH 31.3 02/18/2020 0351   MCHC 33.6 02/18/2020 0351   RDW 16.2 (H) 02/18/2020 0351   RDW 14.4 11/29/2019 1559   RDW 15.3 (H) 05/04/2013 1240   LYMPHSABS 0.5 (L) 02/18/2020 0351   LYMPHSABS 0.7 11/29/2019 1559   LYMPHSABS 1.1 05/04/2013 1240   MONOABS 0.5 02/18/2020 0351   MONOABS 0.6 05/04/2013 1240   EOSABS 0.1 02/18/2020 0351   EOSABS 0.1 11/29/2019 1559     EOSABS 0.1 05/04/2013 1240   BASOSABS 0.1 02/18/2020 0351   BASOSABS 0.1 11/29/2019 1559   BASOSABS 0.0 05/04/2013 1240   BMP Latest Ref Rng & Units 02/18/2020 02/17/2020 02/16/2020  Glucose 70 - 99 mg/dL 195(H) 178(H) 157(H)  BUN 8 - 23 mg/dL 68(H) 61(H) 51(H)  Creatinine 0.61 - 1.24 mg/dL 15.33(H) 13.95(H) 12.34(H)  BUN/Creat Ratio 10 - 24 - - -  Sodium 135 - 145 mmol/L 134(L) 136 139  Potassium 3.5 - 5.1 mmol/L 5.6(H) 4.9 4.6  Chloride 98 - 111 mmol/L 88(L) 90(L) 92(L)  CO2 22 - 32 mmol/L 20(L) 20(L) 21(L)  Calcium 8.9 - 10.3 mg/dL 6.9(L)  6.7(L) 7.3(L)       BP (!) 127/46   Pulse (!) 129   Temp 99.5 F (37.5 C) (Oral)   Resp 20   Ht 6' (1.829 m)   Wt 132.6 kg   SpO2 96%   BMI 39.65 kg/m    I/O last 3 completed shifts: In: 2435.9 [I.V.:2335.9; IV Piggyback:100] Out: 1200 [Emesis/NG output:1200] No intake/output data recorded.  SpO2: 96 % FiO2 (%): (S) 90 %  Estimated body mass index is 39.65 kg/m as calculated from the following:   Height as of this encounter: 6' (1.829 m).   Weight as of this encounter: 132.6 kg.  SIGNIFICANT EVENTS   REVIEW OF SYSTEMS  PATIENT IS UNABLE TO PROVIDE COMPLETE REVIEW OF SYSTEMS DUE TO SEVERE CRITICAL ILLNESS        PHYSICAL EXAMINATION:  GENERAL:critically ill appearing, +resp distress NECK: Supple.  PULMONARY: +rhonchi, +wheezing CARDIOVASCULAR: S1 and S2. Regular rate and rhythm. No murmurs, rubs, or gallops.  GASTROINTESTINAL: Soft, nontender, -distended.  Positive bowel sounds.   MUSCULOSKELETAL: No swelling, clubbing, or edema.  NEUROLOGIC: obtunded, GCS<8 SKIN:intact,warm,dry  MEDICATIONS: I have reviewed all medications and confirmed regimen as documented   CULTURE RESULTS   Recent Results (from the past 240 hour(s))  SARS CORONAVIRUS 2 (TAT 6-24 HRS) Nasopharyngeal Nasopharyngeal Swab     Status: None   Collection Time: 02/12/20 11:16 AM   Specimen: Nasopharyngeal Swab  Result Value Ref Range Status   SARS Coronavirus 2 NEGATIVE NEGATIVE Final    Comment: (NOTE) SARS-CoV-2 target nucleic acids are NOT DETECTED.  The SARS-CoV-2 RNA is generally detectable in upper and lower respiratory specimens during the acute phase of infection. Negative results do not preclude SARS-CoV-2 infection, do not rule out co-infections with other pathogens, and should not be used as the sole basis for treatment or other patient management decisions. Negative results must be combined with clinical observations, patient history, and epidemiological information.  The expected result is Negative.  Fact Sheet for Patients: https://www.fda.gov/media/138098/download  Fact Sheet for Healthcare Providers: https://www.fda.gov/media/138095/download  This test is not yet approved or cleared by the [place] FDA and  has been authorized for detection and/or diagnosis of SARS-CoV-2 by FDA under an Emergency Use Authorization (EUA). This EUA will remain  in effect (meaning this test can be used) for the duration of the COVID-19 declaration under Se ction 564(b)(1) of the Act, 21 U.S.C. section 360bbb-3(b)(1), unless the authorization is terminated or revoked sooner.  Performed at [facility], [address] [place], [place] [address]   Culture, blood (routine x 2)     Status: None (Preliminary result)   Collection Time: 02/11/2020 12:29 PM   Specimen: BLOOD  Result Value Ref Range Status   Specimen Description BLOOD RIGHT ANTECUBITAL  Final   Special Requests   Final      BOTTLES DRAWN AEROBIC AND ANAEROBIC Blood Culture adequate volume   Culture   Final    NO GROWTH 4 DAYS Performed at [facility], [address] [place] [place], [place] [address]    Report Status PENDING  Incomplete  Culture, blood (routine x 2)     Status: None (Preliminary result)   Collection Time: 03/01/2020 12:30 PM   Specimen: BLOOD  Result Value Ref Range Status   Specimen Description BLOOD BLOOD RIGHT HAND  Final   Special Requests   Final    BOTTLES DRAWN AEROBIC AND ANAEROBIC Blood Culture adequate volume   Culture   Final    NO GROWTH 4 DAYS Performed at [facility], [address], [place], [place] [address]    Report Status PENDING  Incomplete  MRSA PCR Screening     Status: None   Collection Time: 02/28/2020 12:48 PM   Specimen: Nasal Mucosa; Nasopharyngeal  Result Value Ref Range Status   MRSA by PCR NEGATIVE NEGATIVE Final    Comment:        The GeneXpert MRSA Assay (FDA approved for NASAL specimens only), is one component of  a comprehensive MRSA colonization surveillance program. It is not intended to diagnose MRSA infection nor to guide or monitor treatment for MRSA infections. Performed at [facility], [address] [place], [place] [address]           IMAGING    DG Chest 1 View  Result Date: 02/18/2020 CLINICAL DATA:  Oxygen desaturation EXAM: CHEST  1 VIEW COMPARISON:  Yesterday FINDINGS: The endotracheal tube tip is at the clavicular heads. The enteric tube tip is not visualized beyond the lower esophagus. Dialysis catheter on the right with tip at the upper right atrium. Cardiopericardial enlargement. Increased density at the bases, especially on the left. No visible effusion or pneumothorax. These results will be called to the ordering clinician or representative by the Radiologist Assistant, and communication documented in the PACS or [employer]. IMPRESSION: 1. The enteric tube tip appears to terminate at the lower esophagus, but certainty is limited by patient anatomy. Consider advancement and KUB. 2. Cardiomegaly with mild interval lower lobe opacification, atelectasis or infiltrate. Electronically Signed   By: Monte Fantasia M.D.   On: 02/18/2020 05:50   DG Abd 1 View  Result Date: 02/18/2020 CLINICAL DATA:  OG tube placement EXAM: ABDOMEN - 1 VIEW COMPARISON:  02/11/2020 FINDINGS: Enteric tube tip is in the left upper quadrant consistent with location in the upper stomach. There has been advancement since the previous study. Right central venous catheter with tip over the cavoatrial junction region. Visualized bowel are not abnormally distended. Vascular calcifications in the upper abdomen. Old left rib fractures. Consolidation and air bronchograms in the left lung base. IMPRESSION: Enteric tube tip is been advanced and now in satisfactory apparent position. Electronically Signed   By: Lucienne Capers M.D.   On: 02/18/2020 06:48   MR BRAIN WO CONTRAST  Result Date:  02/17/2020 CLINICAL DATA:  Neuro deficit.  Stroke suspected. EXAM: MRI HEAD WITHOUT CONTRAST TECHNIQUE: Multiplanar, multiecho pulse sequences of the brain and surrounding structures were obtained without intravenous contrast. COMPARISON:  02/16/2020 head CT and prior.  03/13/2012 MRI head. FINDINGS: Brain: No diffusion-weighted signal abnormality. No intracranial hemorrhage. No midline shift, ventriculomegaly or extra-axial fluid collection. No mass lesion. Remote right basal ganglia insult. Vascular: Grossly preserved major intracranial flow voids. Skull and upper cervical spine: Normal marrow signal. Sinuses/Orbits: Sequela of bilateral lens replacement. Bilateral  proptosis, unchanged. Mild to moderate pansinus mucosal thickening. Bilateral mastoid effusions. Other: None. IMPRESSION: No acute intracranial process. Remote right basal ganglia insult. Electronically Signed   By: Primitivo Gauze M.D.   On: 02/17/2020 15:02     Nutrition Status: Nutrition Problem: Inadequate oral intake Etiology: inability to eat (pt sedated and ventilated) Signs/Symptoms: NPO status       Indwelling Urinary Catheter continued, requirement due to   Reason to continue Indwelling Urinary Catheter strict Intake/Output monitoring for hemodynamic instability   Central Line/ continued, requirement due to  Reason to continue [place] of central venous pressure or other hemodynamic parameters and poor IV access   Ventilator continued, requirement due to severe respiratory failure   Ventilator Sedation RASS 0 to -2      ASSESSMENT AND PLAN SYNOPSIS  ACUTE SEVERE CARDIAC ARREST DUE TO ACUTE ISCHEMIC CARDIOMYOPATHY Acute and severe hypoxic resp failure, complicated by aspiration pneumonia with  FINDINGS STILL CONCERNING FOR ANOXIC BRAIN INJURY WITH MULTIORGAN FAILURE  Severe ACUTE Hypoxic and Hypercapnic Respiratory Failure -continue Full MV support -continue Bronchodilator Therapy -Wean Fio2  and PEEP as tolerated UNABLE TO WEAN FROM VENT TODAY -VAP/VENT bundle implementation  ACUTE SYSTOLIC CARDIAC FAILURE- HFp EF -oxygen as needed -Lasix as tolerated   Morbid obesity, possible OSA.   Will certainly impact respiratory mechanics, ventilator weaning Suspect will need to consider additional PEEP  CHRONIC KIDNEY INJURY/Renal Failure -continue Foley Catheter-assess need -Avoid nephrotoxic agents -Follow urine output, BMP -Ensure adequate renal perfusion, optimize oxygenation -Renal dose medications HD as needed     NEUROLOGY Acute toxic metabolic encephalopathy, need for sedation Goal RASS -2 to -3  SHOCK-CARDIOGENIC -use vasopressors to keep MAP>65 s needed  CARDIAC ICU monitoring  ID -continue IV abx as prescibed -follow up cultures Check SPUTUM cultures DX of aspiration pneumonia  GI GI PROPHYLAXIS as indicated  DIET-->TF's as tolerated Constipation protocol as indicated  ENDO - will use ICU hypoglycemic\Hyperglycemia protocol if indicated     ELECTROLYTES -follow labs as needed -replace as needed -pharmacy consultation and following   DVT/GI PRX ordered and assessed TRANSFUSIONS AS NEEDED MONITOR FSBS I Assessed the need for Labs I Assessed the need for Foley I Assessed the need for Central Venous Line Family Discussion when available I Assessed the need for Mobilization I made an Assessment of medications to be adjusted accordingly Safety Risk assessment completed   CASE DISCUSSED IN MULTIDISCIPLINARY ROUNDS WITH ICU TEAM  Critical Care Time devoted to patient care services described in this note is 77 minutes.   Overall, patient is critically ill, prognosis is guarded.  Patient with Multiorgan failure and at high risk for cardiac arrest and death.   RECOMMEND DNR STATUS  Corrin Parker, M.D.  [facility]  Medical Director [place] Director [facility] Cardio-Pulmonary Department

## 2020-02-19 DIAGNOSIS — G931 Anoxic brain damage, not elsewhere classified: Secondary | ICD-10-CM | POA: Diagnosis not present

## 2020-02-19 DIAGNOSIS — J9601 Acute respiratory failure with hypoxia: Secondary | ICD-10-CM | POA: Diagnosis not present

## 2020-02-19 DIAGNOSIS — I469 Cardiac arrest, cause unspecified: Secondary | ICD-10-CM | POA: Diagnosis not present

## 2020-02-19 LAB — CBC WITH DIFFERENTIAL/PLATELET
Abs Immature Granulocytes: 0.1 10*3/uL — ABNORMAL HIGH (ref 0.00–0.07)
Basophils Absolute: 0 10*3/uL (ref 0.0–0.1)
Basophils Relative: 0 %
Eosinophils Absolute: 0 10*3/uL (ref 0.0–0.5)
Eosinophils Relative: 0 %
HCT: 20.9 % — ABNORMAL LOW (ref 39.0–52.0)
Hemoglobin: 7.1 g/dL — ABNORMAL LOW (ref 13.0–17.0)
Immature Granulocytes: 2 %
Lymphocytes Relative: 5 %
Lymphs Abs: 0.2 10*3/uL — ABNORMAL LOW (ref 0.7–4.0)
MCH: 31.7 pg (ref 26.0–34.0)
MCHC: 34 g/dL (ref 30.0–36.0)
MCV: 93.3 fL (ref 80.0–100.0)
Monocytes Absolute: 0.4 10*3/uL (ref 0.1–1.0)
Monocytes Relative: 8 %
Neutro Abs: 4 10*3/uL (ref 1.7–7.7)
Neutrophils Relative %: 85 %
Platelets: 94 10*3/uL — ABNORMAL LOW (ref 150–400)
RBC: 2.24 MIL/uL — ABNORMAL LOW (ref 4.22–5.81)
RDW: 16.6 % — ABNORMAL HIGH (ref 11.5–15.5)
WBC: 4.7 10*3/uL (ref 4.0–10.5)
nRBC: 0.9 % — ABNORMAL HIGH (ref 0.0–0.2)

## 2020-02-19 LAB — COMPREHENSIVE METABOLIC PANEL
ALT: 15 U/L (ref 0–44)
AST: 31 U/L (ref 15–41)
Albumin: 2.6 g/dL — ABNORMAL LOW (ref 3.5–5.0)
Alkaline Phosphatase: 96 U/L (ref 38–126)
Anion gap: 25 — ABNORMAL HIGH (ref 5–15)
BUN: 85 mg/dL — ABNORMAL HIGH (ref 8–23)
CO2: 19 mmol/L — ABNORMAL LOW (ref 22–32)
Calcium: 6.7 mg/dL — ABNORMAL LOW (ref 8.9–10.3)
Chloride: 92 mmol/L — ABNORMAL LOW (ref 98–111)
Creatinine, Ser: 15.29 mg/dL — ABNORMAL HIGH (ref 0.61–1.24)
GFR, Estimated: 3 mL/min — ABNORMAL LOW (ref >60)
Glucose, Bld: 305 mg/dL — ABNORMAL HIGH (ref 70–99)
Potassium: 6 mmol/L — ABNORMAL HIGH (ref 3.5–5.1)
Sodium: 136 mmol/L (ref 135–145)
Total Bilirubin: 1.1 mg/dL (ref 0.3–1.2)
Total Protein: 6.4 g/dL — ABNORMAL LOW (ref 6.5–8.1)

## 2020-02-19 LAB — BASIC METABOLIC PANEL
Anion gap: 27 — ABNORMAL HIGH (ref 5–15)
BUN: 88 mg/dL — ABNORMAL HIGH (ref 8–23)
CO2: 20 mmol/L — ABNORMAL LOW (ref 22–32)
Calcium: 6.8 mg/dL — ABNORMAL LOW (ref 8.9–10.3)
Chloride: 88 mmol/L — ABNORMAL LOW (ref 98–111)
Creatinine, Ser: 15.72 mg/dL — ABNORMAL HIGH (ref 0.61–1.24)
GFR, Estimated: 3 mL/min — ABNORMAL LOW (ref >60)
Glucose, Bld: 305 mg/dL — ABNORMAL HIGH (ref 70–99)
Potassium: 5.7 mmol/L — ABNORMAL HIGH (ref 3.5–5.1)
Sodium: 135 mmol/L (ref 135–145)

## 2020-02-19 LAB — MAGNESIUM: Magnesium: 2.4 mg/dL (ref 1.7–2.4)

## 2020-02-19 LAB — GLUCOSE, CAPILLARY
Glucose-Capillary: 193 mg/dL — ABNORMAL HIGH (ref 70–99)
Glucose-Capillary: 204 mg/dL — ABNORMAL HIGH (ref 70–99)
Glucose-Capillary: 222 mg/dL — ABNORMAL HIGH (ref 70–99)
Glucose-Capillary: 230 mg/dL — ABNORMAL HIGH (ref 70–99)
Glucose-Capillary: 231 mg/dL — ABNORMAL HIGH (ref 70–99)
Glucose-Capillary: 294 mg/dL — ABNORMAL HIGH (ref 70–99)

## 2020-02-19 LAB — CULTURE, BLOOD (ROUTINE X 2)
Culture: NO GROWTH
Culture: NO GROWTH
Special Requests: ADEQUATE
Special Requests: ADEQUATE

## 2020-02-19 LAB — PHOSPHORUS: Phosphorus: 14.1 mg/dL — ABNORMAL HIGH (ref 2.5–4.6)

## 2020-02-19 LAB — TRIGLYCERIDES: Triglycerides: 664 mg/dL — ABNORMAL HIGH (ref <150)

## 2020-02-19 LAB — AMMONIA: Ammonia: 29 umol/L (ref 9–35)

## 2020-02-19 MED ORDER — HYDRALAZINE HCL 20 MG/ML IJ SOLN
10.0000 mg | Freq: Four times a day (QID) | INTRAMUSCULAR | Status: DC | PRN
  Administered 2020-02-19: 10 mg via INTRAVENOUS
  Filled 2020-02-19: qty 1

## 2020-02-19 MED ORDER — MIDAZOLAM 50MG/50ML (1MG/ML) PREMIX INFUSION
0.5000 mg/h | INTRAVENOUS | Status: DC
  Administered 2020-02-19: 0.5 mg/h via INTRAVENOUS
  Administered 2020-02-20 (×2): 5 mg/h via INTRAVENOUS
  Filled 2020-02-19 (×3): qty 50

## 2020-02-19 MED ORDER — INSULIN ASPART 100 UNIT/ML ~~LOC~~ SOLN
10.0000 [IU] | Freq: Once | SUBCUTANEOUS | Status: AC
  Administered 2020-02-19: 10 [IU] via INTRAVENOUS
  Filled 2020-02-19: qty 0.1
  Filled 2020-02-19: qty 1

## 2020-02-19 MED ORDER — DEXTROSE 50 % IV SOLN
1.0000 | Freq: Once | INTRAVENOUS | Status: AC
  Administered 2020-02-19: 50 mL via INTRAVENOUS
  Filled 2020-02-19: qty 50

## 2020-02-19 MED ORDER — SODIUM BICARBONATE 8.4 % IV SOLN
50.0000 meq | Freq: Once | INTRAVENOUS | Status: AC
  Administered 2020-02-19: 50 meq via INTRAVENOUS
  Filled 2020-02-19: qty 50

## 2020-02-19 MED ORDER — VALPROATE SODIUM 500 MG/5ML IV SOLN
2600.0000 mg | Freq: Once | INTRAVENOUS | Status: AC
  Administered 2020-02-19: 2600 mg via INTRAVENOUS
  Filled 2020-02-19: qty 26

## 2020-02-19 MED ORDER — PATIROMER SORBITEX CALCIUM 8.4 G PO PACK
16.8000 g | PACK | Freq: Once | ORAL | Status: AC
  Administered 2020-02-19: 16.8 g via ORAL
  Filled 2020-02-19: qty 2

## 2020-02-19 MED ORDER — VALPROIC ACID 250 MG/5ML PO SOLN
5.0000 mg/kg | Freq: Three times a day (TID) | ORAL | Status: DC
  Administered 2020-02-20 – 2020-02-23 (×11): 664 mg
  Filled 2020-02-19 (×14): qty 15

## 2020-02-19 NOTE — Progress Notes (Signed)
eeg done °

## 2020-02-19 NOTE — Progress Notes (Signed)
Dialysis continued and completed as prescribed with patient continuing to have seizure activity throughout treatment. Noted blood pressure fluctuation throughout treatment during these activities. UF goal 41ml with 3hrs of treatment time with Dr. [name] awareness.

## 2020-02-19 NOTE — Progress Notes (Signed)
[facility]  ROUNDING NOTE   Subjective:   Patient stays critically ill, family at bedside.He is  Intubated,  FiO2 increased to 75% Not on vasopressors He is sedated with fentanyl and Precedex OGT draining yellowish brown output  Patient having frequent myoclonus activities,planning Neuro consult today.  Patient received PD last night. He has Hyperphosphatemia with Phos 14.1  and hyperkalemia K+ 5.7  today. Planning for a session of hemodialysis, orders placed.   Objective:  Vital signs in last 24 hours:  Temp:  [98.6 F (37 C)-99.5 F (37.5 C)] 99.5 F (37.5 C) (10/19 1200) Pulse Rate:  [57-81] 75 (10/19 1200) Resp:  [18-37] 21 (10/19 1200) BP: (89-210)/(44-105) 142/66 (10/19 1330) SpO2:  [81 %-99 %] 96 % (10/19 1200) Arterial Line BP: (107-178)/(53-114) 119/59 (10/19 1315) FiO2 (%):  [70 %-75 %] 75 % (10/19 1141) Weight:  [132.8 kg] 132.8 kg (10/19 0432)  Weight change: 0.2 kg Filed Weights   02/17/20 0417 02/18/20 0358 02/19/20 0432  Weight: 132 kg 132.6 kg 132.8 kg    Intake/Output: I/O last 3 completed shifts: In: 2849.7 [I.V.:2024.7; NG/GT:625; IV [id number]:782] Out: 600 [Emesis/NG output:600]   Intake/Output this shift:  Total I/O In: 351.2 [I.V.:351.2] Out: -   Physical Exam: General:  Critically ill appearing  Head:  Normocephalic, atraumatic, +ETT, +OGT  Eyes:  Sclerae and conjunctivae clear  Lungs:   ventilator assisted,FiO2 75%,Lungs clear,but diminished at the bases  Heart:  S1S2, no rubs or gallops,Sinus Rhythm with HR in 70's  Abdomen:   Soft,non distended, obese  Extremities:  Trace peripheral edema.  Neurologic:  sedated  Skin: No acute lesions or rashes noted  Access: PD catheter, RIJ permcath, and left radiocephalic AVF +bruit     Basic Metabolic Panel: Recent Labs  Lab 02/15/20 0100 02/15/20 0440 02/15/20 1409 02/15/20 1409 02/16/20 0444 02/16/20 0444 02/17/20 0415 02/17/20 0415 02/18/20 0351 02/18/20 1454  02/18/20 2300 02/19/20 0450  NA   < >  --  137   < > 139  --  136  --  134*  --  136 135  K   < >  --  5.4*   < > 4.6   < > 4.9  --  5.6* 5.8* 6.0* 5.7*  CL   < >  --  86*   < > 92*  --  90*  --  88*  --  92* 88*  CO2   < >  --  21*   < > 21*  --  20*  --  20*  --  19* 20*  GLUCOSE   < >  --  143*   < > 157*  --  178*  --  195*  --  305* 305*  BUN   < >  --  90*   < > 51*  --  61*  --  68*  --  85* 88*  CREATININE   < >  --  18.60*   < > 12.34*  --  13.95*  --  15.33*  --  15.29* 15.72*  CALCIUM   < >  --  6.4*   < > 7.3*   < > 6.7*   < > 6.9*  --  6.7* 6.8*  MG  --  2.8*  --   --  2.3  --  2.0  --  2.3  --   --  2.4  PHOS   < > 13.5* 13.5*  --  10.2*  --  10.0*  --  12.7*  --   --  14.1*   < > = values in this interval not displayed.    Liver Function Tests: Recent Labs  Lab 02/22/2020 1228 02/15/20 1409 02/18/20 2300  AST 17  --  31  ALT 14  --  15  ALKPHOS 73  --  96  BILITOT 1.0  --  1.1  PROT 7.4  --  6.4*  ALBUMIN 3.2* 2.9* 2.6*   No results for input(s): LIPASE, AMYLASE in the last 168 hours. No results for input(s): AMMONIA in the last 168 hours.  CBC: Recent Labs  Lab 02/15/20 0045 02/15/20 0440 02/15/20 1409 02/16/20 0444 02/17/20 0415 02/18/20 0351 02/19/20 0450  WBC 6.6   < > 6.4 8.9 6.9 6.9 4.7  NEUTROABS 5.2  --   --  7.0 4.9 5.7 4.0  HGB 10.0*   < > 8.4* 9.4* 8.0* 7.9* 7.1*  HCT 30.5*   < > 24.4* 27.9* 22.0* 23.5* 20.9*  MCV 93.3   < > 91.0 93.0 92.8 93.3 93.3  PLT 125*   < > 123* 135* 92* 84* 94*   < > = values in this interval not displayed.    Cardiac Enzymes: Recent Labs  Lab 02/08/2020 1228  CKMB 33.0*    BNP: Invalid input(s): POCBNP  CBG: Recent Labs  Lab 02/18/20 1932 02/18/20 2340 02/19/20 0317 02/19/20 0709 02/19/20 1104  GLUCAP 228* 256* 294* 231* 31*    Microbiology: Results for orders placed or performed during the hospital encounter of 02/29/2020  Culture, blood (routine x 2)     Status: None   Collection Time:  02/28/2020 12:29 PM   Specimen: BLOOD  Result Value Ref Range Status   Specimen Description BLOOD RIGHT ANTECUBITAL  Final   Special Requests   Final    BOTTLES DRAWN AEROBIC AND ANAEROBIC Blood Culture adequate volume   Culture   Final    NO GROWTH 5 DAYS Performed at [facility], [address] [place], [place] [address]    Report Status 02/19/2020 FINAL  Final  Culture, blood (routine x 2)     Status: None   Collection Time: 02/18/2020 12:30 PM   Specimen: BLOOD  Result Value Ref Range Status   Specimen Description BLOOD BLOOD RIGHT HAND  Final   Special Requests   Final    BOTTLES DRAWN AEROBIC AND ANAEROBIC Blood Culture adequate volume   Culture   Final    NO GROWTH 5 DAYS Performed at [facility], [address] [place], [place] [address]    Report Status 02/19/2020 FINAL  Final  MRSA PCR Screening     Status: None   Collection Time: 02/04/2020 12:48 PM   Specimen: Nasal Mucosa; Nasopharyngeal  Result Value Ref Range Status   MRSA by PCR NEGATIVE NEGATIVE Final    Comment:        The GeneXpert MRSA Assay (FDA approved for NASAL specimens only), is one component of a comprehensive MRSA colonization surveillance program. It is not intended to diagnose MRSA infection nor to guide or monitor treatment for MRSA infections. Performed at [facility], [address] [place], [place] [address]   Culture, respiratory (non-expectorated)     Status: None (Preliminary result)   Collection Time: 02/18/20  2:11 PM   Specimen: Tracheal Aspirate; Respiratory  Result Value Ref Range Status   Specimen Description   Final    TRACHEAL ASPIRATE Performed at [facility], [address] [place], [place] [address]  Special Requests   Final    NONE Performed at [facility], [place], [place] [address]    Gram Stain   Final    ABUNDANT WBC PRESENT, PREDOMINANTLY PMN ABUNDANT GRAM POSITIVE COCCI FEW YEAST     Culture   Final    TOO YOUNG TO READ Performed at [facility], [place] [address], [place], [place] [address]    Report Status PENDING  Incomplete    Coagulation Studies: No results for input(s): LABPROT, INR in the last 72 hours.  Urinalysis: No results for input(s): COLORURINE, LABSPEC, PHURINE, GLUCOSEU, HGBUR, BILIRUBINUR, KETONESUR, PROTEINUR, UROBILINOGEN, NITRITE, LEUKOCYTESUR in the last 72 hours.  Invalid input(s): APPERANCEUR    Imaging: DG Chest 1 View  Result Date: 02/18/2020 CLINICAL DATA:  Oxygen desaturation EXAM: CHEST  1 VIEW COMPARISON:  Yesterday FINDINGS: The endotracheal tube tip is at the clavicular heads. The enteric tube tip is not visualized beyond the lower esophagus. Dialysis catheter on the right with tip at the upper right atrium. Cardiopericardial enlargement. Increased density at the bases, especially on the left. No visible effusion or pneumothorax. These results will be called to the ordering clinician or representative by the Radiologist Assistant, and communication documented in the PACS or [employer]. IMPRESSION: 1. The enteric tube tip appears to terminate at the lower esophagus, but certainty is limited by patient anatomy. Consider advancement and KUB. 2. Cardiomegaly with mild interval lower lobe opacification, atelectasis or infiltrate. Electronically Signed   By: Monte Fantasia M.D.   On: 02/18/2020 05:50   DG Abd 1 View  Result Date: 02/18/2020 CLINICAL DATA:  OG tube placement EXAM: ABDOMEN - 1 VIEW COMPARISON:  02/27/2020 FINDINGS: Enteric tube tip is in the left upper quadrant consistent with location in the upper stomach. There has been advancement since the previous study. Right central venous catheter with tip over the cavoatrial junction region. Visualized bowel are not abnormally distended. Vascular calcifications in the upper abdomen. Old left rib fractures. Consolidation and air bronchograms in the left lung base. IMPRESSION:  Enteric tube tip is been advanced and now in satisfactory apparent position. Electronically Signed   By: Lucienne Capers M.D.   On: 02/18/2020 06:48   CT ANGIO CHEST PE W OR WO CONTRAST  Result Date: 02/18/2020 CLINICAL DATA:  Respiratory failure EXAM: CT ANGIOGRAPHY CHEST WITH CONTRAST TECHNIQUE: Multidetector CT imaging of the chest was performed using the standard protocol during bolus administration of intravenous contrast. Multiplanar CT image reconstructions and MIPs were obtained to evaluate the vascular anatomy. CONTRAST:  18m OMNIPAQUE IOHEXOL 350 MG/ML SOLN COMPARISON:  11/19/2019 FINDINGS: Cardiovascular: Moderate coronary artery calcifications, most pronounced in the left anterior descending coronary artery. Aortic calcifications. No aneurysm. Mild cardiomegaly. Mediastinum/Nodes: No mediastinal, hilar, or axillary adenopathy. Endotracheal tube tip is in the midtrachea. Thyroid unremarkable. Lungs/Pleura: Lower lobe volume loss and consolidation bilaterally. This could reflect atelectasis, pneumonia, or aspiration. No effusions. Upper Abdomen: Subtle nodularity of the liver suggesting cirrhosis as seen on prior study. Prior cholecystectomy. Small amount of perihepatic ascites. Musculoskeletal: Chest wall soft tissues are unremarkable. No acute bony abnormality. Review of the MIP images confirms the above findings. IMPRESSION: No evidence of pulmonary embolus. Cardiomegaly, coronary artery disease. Volume loss and dense consolidation in the lower lobes which could reflect atelectasis, pneumonia or aspiration. Nodular contours of the liver suggest cirrhosis. Small amount of Peri hepatic ascites. Aortic Atherosclerosis (ICD10-I70.0). Electronically Signed   By: KRolm BaptiseM.D.   On: 02/18/2020 17:46  MR BRAIN WO CONTRAST  Result Date: 02/17/2020 CLINICAL DATA:  Neuro deficit.  Stroke suspected. EXAM: MRI HEAD WITHOUT CONTRAST TECHNIQUE: Multiplanar, multiecho pulse sequences of the brain and  surrounding structures were obtained without intravenous contrast. COMPARISON:  02/11/2020 head CT and prior.  03/13/2012 MRI head. FINDINGS: Brain: No diffusion-weighted signal abnormality. No intracranial hemorrhage. No midline shift, ventriculomegaly or extra-axial fluid collection. No mass lesion. Remote right basal ganglia insult. Vascular: Grossly preserved major intracranial flow voids. Skull and upper cervical spine: Normal marrow signal. Sinuses/Orbits: Sequela of bilateral lens replacement. Bilateral proptosis, unchanged. Mild to moderate pansinus mucosal thickening. Bilateral mastoid effusions. Other: None. IMPRESSION: No acute intracranial process. Remote right basal ganglia insult. Electronically Signed   By: Primitivo Gauze M.D.   On: 02/17/2020 15:02   ECHOCARDIOGRAM LIMITED  Result Date: 02/18/2020    ECHOCARDIOGRAM LIMITED REPORT   Patient Name:   KAYD LAUNER Date of Exam: 02/18/2020 Medical Rec #:  [id number]      Height:       72.0 in Accession #:    [id number]     Weight:       292.3 lb Date of Birth:  [birth year]      BSA:          2.504 m Patient Age:    64 years       BP:           Not listed in chart/Not listed in                                              chart mmHg Patient Gender: M              HR:           73 bpm. Exam Location:  [facility] Procedure: Cardiac Doppler, Color Doppler and Limited Echo Indications:     Cardiac Arrest 146.9  History:         Patient has prior history of Echocardiogram examinations, most                  recent 02/04/2020. Risk Factors:Sleep Apnea. Chronic diastolic                  CHF.  Sonographer:     Sherrie Sport RDCS (AE) Referring Phys:  [place] Diagnosing Phys: Bartholome Bill MD IMPRESSIONS  1. Left ventricular ejection fraction, by estimation, is 40 to 45%. The left ventricle has mild to moderately decreased function. The left ventricle demonstrates global hypokinesis. There is moderate left ventricular hypertrophy.  2. Right  ventricular systolic function is normal. The right ventricular size is mildly enlarged.  3. Left atrial size was mildly dilated.  4. The mitral valve was not well visualized. Trivial mitral valve regurgitation.  5. The aortic valve was not well visualized. Aortic valve regurgitation is trivial. FINDINGS  Left Ventricle: Left ventricular ejection fraction, by estimation, is 40 to 45%. The left ventricle has mild to moderately decreased function. The left ventricle demonstrates global hypokinesis. There is moderate left ventricular hypertrophy. Right Ventricle: The right ventricular size is mildly enlarged. Right vetricular wall thickness was not well visualized. Right ventricular systolic function is normal. Left Atrium: Left atrial size was mildly dilated. Right Atrium: Right atrial size was normal in size. Pericardium: There is no evidence of pericardial effusion. Mitral Valve: The mitral  valve was not well visualized. Trivial mitral valve regurgitation. Tricuspid Valve: The tricuspid valve is not well visualized. Tricuspid valve regurgitation is mild. Aortic Valve: The aortic valve was not well visualized. Aortic valve regurgitation is trivial. Pulmonic Valve: The pulmonic valve was not well visualized. Pulmonic valve regurgitation is trivial. IAS/Shunts: The interatrial septum was not assessed. LEFT VENTRICLE PLAX 2D LVIDd:         4.86 cm      Diastology LVIDs:         4.18 cm      LV e' medial:    5.66 cm/s LV PW:         1.81 cm      LV E/e' medial:  12.9 LV IVS:        2.12 cm      LV e' lateral:   11.90 cm/s LVOT diam:     2.30 cm      LV E/e' lateral: 6.1 LVOT Area:     4.15 cm  LV Volumes (MOD) LV vol d, MOD A2C: 162.0 ml LV vol d, MOD A4C: 178.0 ml LV vol s, MOD A2C: 84.5 ml LV vol s, MOD A4C: 111.0 ml LV SV MOD A2C:     77.5 ml LV SV MOD A4C:     178.0 ml LV SV MOD BP:      80.7 ml LEFT ATRIUM             Index       RIGHT ATRIUM           Index LA diam:        4.00 cm 1.60 cm/m  RA Area:     29.00 cm  LA Vol (A2C):   75.3 ml 30.08 ml/m RA Volume:   85.70 ml  34.23 ml/m LA Vol (A4C):   96.6 ml 38.59 ml/m LA Biplane Vol: 88.2 ml 35.23 ml/m                        PULMONIC VALVE AORTA                 PV Vmax:        0.59 m/s Ao Root diam: 3.30 cm PV Peak grad:   1.4 mmHg                       RVOT Peak grad: 3 mmHg  MITRAL VALVE               TRICUSPID VALVE MV Area (PHT): 2.52 cm    TR Peak grad:   14.6 mmHg MV Decel Time: 301 msec    TR Vmax:        191.00 cm/s MV E velocity: 72.80 cm/s MV A velocity: 49.00 cm/s  SHUNTS MV E/A ratio:  1.49        Systemic Diam: 2.30 cm Bartholome Bill MD Electronically signed by Bartholome Bill MD Signature Date/Time: 02/18/2020/1:21:04 PM    Final      Medications:   . sodium chloride    . sodium chloride 10 mL/hr at 02/15/20 0600  . ampicillin-sulbactam (UNASYN) IV Stopped (02/18/20 1753)  . dexmedetomidine (PRECEDEX) IV infusion 1 mcg/kg/hr (02/19/20 1206)  . dialysis solution 1.5% low-MG/low-CA    . dialysis solution 2.5% low-MG/low-CA    . famotidine (PEPCID) IV Stopped (02/18/20 1706)  . fentaNYL infusion INTRAVENOUS 250 mcg/hr (02/19/20 1206)  . norepinephrine (LEVOPHED) Adult infusion Stopped (02/18/20 1527)  .  vasopressin Stopped (02/18/20 1710)   . sodium chloride   Intravenous Once  . amiodarone  200 mg Per Tube BID  . chlorhexidine gluconate (MEDLINE KIT)  15 mL Mouth Rinse BID  . Chlorhexidine Gluconate Cloth  6 each Topical Daily  . docusate  100 mg Per Tube BID  . feeding supplement (NEPRO CARB STEADY)  1,000 mL Per Tube Q24H  . feeding supplement (PROSource TF)  90 mL Per Tube 5 X Daily  . fentaNYL (SUBLIMAZE) injection  100 mcg Intravenous Once  . gentamicin cream  1 application Topical Daily  . heparin  5,000 Units Subcutaneous Q8H  . insulin aspart  0-6 Units Subcutaneous Q4H  . ipratropium-albuterol  3 mL Nebulization Q4H  . mouth rinse  15 mL Mouth Rinse 10 times per day  . methylPREDNISolone (SOLU-MEDROL) injection  20 mg  Intravenous Q12H  . multivitamin  1 tablet Per Tube QHS  . polyethylene glycol  17 g Per Tube Daily   docusate sodium, fentaNYL, hydrALAZINE, midazolam, ondansetron (ZOFRAN) IV, polyethylene glycol, vecuronium  Assessment/ Plan:  Mr. DEWAINE MOROCHO is a 64 y.o. white male with end stage renal disease on peritoneal dialysis, hepatic cirrhosis, congestive heart failure, COPD, atrial fibrillation, depression, GERD, sleep apnea, and gout who was admitted after cardiac arrest prior to scheduled colonoscopy on 02/08/2020. Patient currently admitted to [facility] ICU requiring intubation, mechanical ventilation and vasopressors.   # ESRD on Peritoneal dialysis:  Patient received peritoneal dialysis last night, with poor clearence -Will plan for a session of hemodialysis today at the bedside -Orders placed  #Hyperkalemia Potassium 5.7 today  #Hypotension with cardiogenic shock - holding metoprolol and torsemide -Normotensive today  #Anemia with renal failure and GI bleed.  Received PRBC transfusion on 02/06/2020 Lab Results  Component Value Date   HGB 7.1 (L) 02/19/2020    Hematology and Gastro enterology teams involved in the care Receives Epogen 40,000 unit subcutaneous weekly as outpatient, last dose was on 02/11/2020 We will continue monitoring closely  #Secondary Hyperparathyroidism with hyperphosphatemia.  PTH Lab Results  Component Value Date   PTH 222 (H) 12/27/2019   CALCIUM 6.8 (L) 02/19/2020   CAION 0.97 (L) 11/08/2019   PHOS 14.1 (H) 02/19/2020    Severe hyperphosphatemia noted Hemodialysis today  # Acute respiratory failure Ventilator Dependent, FiO2 increased to 75% today Suspicion of aspiration pneumonia. ICU team started unasyn  CT angiogram on 02/18/2020 IMPRESSION: No evidence of pulmonary embolus. Cardiomegaly, coronary artery disease. Volume loss and dense consolidation in the lower lobes which could reflect atelectasis, pneumonia or aspiration. Nodular  contours of the liver suggest cirrhosis. Small amount of Peri hepatic ascites. Aortic Atherosclerosis (ICD10-I70.0).   LOS: 5 Josmar Messimer 10/19/20211:44 PM

## 2020-02-19 NOTE — Procedures (Addendum)
Patient Name: Patrick Hurst  MRN: [id number]  Epilepsy Attending: Lora Havens  Referring Physician/Provider: Dr Flora Lipps Date: 02/19/2020 Duration: 24.51 mins  Patient history: 64 year old male s/p cardiac arrest x 10 minutes, now unresponsive on the ventilator with depressed brainstem reflexes. He is s/p TTM. EEG to evaluate for seizure  Level of alertness:  comatose  AEDs during EEG study: None  Technical aspects: This EEG study was done with scalp electrodes positioned according to the 10-20 International system of electrode placement. Electrical activity was acquired at a sampling rate of 500Hz  and reviewed with a high frequency filter of 70Hz  and a low frequency filter of 1Hz . EEG data were recorded continuously and digitally stored.   Description: EEG showed generalized periodic epileptiform discharges with overriding fast activity at 1-1.5hz  as well as generalized background suppression. Two seizures were noted at 1455 and 1504, lasting 45 seconds and 1.5 mins respectively. On video patient was noted to have sudden eye opening, upward gaze deviation and rhythmic arm twitching. Concomitant eeg showed generalized polyspikes.  Hyperventilation and photic stimulation were not performed.     ABNORMALITY - Myoclonic Seizure, generalized - Periodic epileptiform discharges with plus pattern, generalized  - Background suppression, generalized   IMPRESSION: This study showed two myoclonic seizures as described above with generalized onset, at 1455 and 1504, lasting 45 seconds and 1.5 mins respectively. 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   Dr Cheral Marker was notified.  Nnenna Meador Barbra Sarks

## 2020-02-19 NOTE — Progress Notes (Signed)
Hemodialysis treatment started at this time with patient continuing to have seizure like activity. Dr. [name] called and made aware of these activities being more frequent with the start of dialysis and blood pressure increased to 200's/100's. New order received to decrease BFR to 200. Order completed with noted improvement with blood pressure. Dialysis continued at this time.

## 2020-02-19 NOTE — Progress Notes (Signed)
CRITICAL CARE NOTE  64 yo male presented for outpatient colonoscopy PEA cardiac arrested during the procedure, s/p 10 minutes CPR, intubated requiring mechanical ventilation with Targeted Temperature Management admitted to the ICU.  History of present illness   64 yo male with a significant history of COPD, OSA, T2DM, ESRD, CHF, AF presented to Endoscopy for an outpatient colonoscopy procedure. In the middle of the procedure his heart rate began to drop and he PEA arrested requiring 10 minutes of CPR prior to achieving ROSC. He received epinephrine, atropine, calcium, sodium bicarb &D50. He was intubated during the code blue requiring mechanical ventilation.  He had a recent admit in August 2021 for acute hypoxic respiratory failure and symptomatic anemia supported on BIPAP and completed a work up with heme/onc and GI. This procedure was to complete this anemia work up that had been initiated during that hospitalization. The patient was unresponsive post arrest and the decision was made to activate TTM. PCCM was consulted for further management and the patient was transferred to the ICU.   Significant Hospital Events    10/14Code Blue in Endo procedure, intubated, admitted to ICU for TTM 02/15/20- patient evaluated at bedside. Spoke to wife at length and reviewed various testing and diagnostics as well as therapy received and short term care plan. Levophed at 38mg/kg/min. S/p PD therapy this am. Patient with atelectasis on CXR this am, weaning FiO2 on MV with plan for SBT in am. Discussed care plan with renal team - Dr KJuleen Chinaconsidering HD.  02/16/20-patient did not pass breathing trial today, had family meeting with son and wife at bedside with RN present throughout. Plan to re-attempt SBT in AM.  02/17/20- patient had increased O2 requirement overnight and is more edematous with anasarca on exam this morning. I met with family and explained patient with numerous serious comorbid  conditions and overall very poor prognosis long term. He was seen by cardiology and nephrology today. Awakening trial with no appreciable improvement in GCS, today plan to repeat SBT if FiO2 can be weaned to <40% and perform MRI brain w/wo to evaluate for ischemic changes post PEA arrest. 10/18 signs of anoxic brain injury    CC  follow up respiratory failure  SUBJECTIVE Patient remains critically ill Prognosis is guarded multiorgan failure  Vent Mode: PRVC FiO2 (%):  [65 %-90 %] 75 % Set Rate:  [24 bmp] 24 bmp Vt Set:  [550 mL] 550 mL PEEP:  [10 cmH20] 10 cmH20 Plateau Pressure:  [23 cmH20-26 cmH20] 26 cmH20  CBC    Component Value Date/Time   WBC 4.7 02/19/2020 0450   RBC 2.24 (L) 02/19/2020 0450   HGB 7.1 (L) 02/19/2020 0450   HGB 8.5 (L) 11/29/2019 1559   HCT 20.9 (L) 02/19/2020 0450   HCT 24.3 (L) 11/29/2019 1559   PLT 94 (L) 02/19/2020 0450   PLT 138 (L) 11/29/2019 1559   MCV 93.3 02/19/2020 0450   MCV 86 11/29/2019 1559   MCV 85 05/04/2013 1240   MCH 31.7 02/19/2020 0450   MCHC 34.0 02/19/2020 0450   RDW 16.6 (H) 02/19/2020 0450   RDW 14.4 11/29/2019 1559   RDW 15.3 (H) 05/04/2013 1240   LYMPHSABS 0.2 (L) 02/19/2020 0450   LYMPHSABS 0.7 11/29/2019 1559   LYMPHSABS 1.1 05/04/2013 1240   MONOABS 0.4 02/19/2020 0450   MONOABS 0.6 05/04/2013 1240   EOSABS 0.0 02/19/2020 0450   EOSABS 0.1 11/29/2019 1559   EOSABS 0.1 05/04/2013 1240   BASOSABS 0.0 02/19/2020 0450  BASOSABS 0.1 11/29/2019 1559   BASOSABS 0.0 05/04/2013 1240   CMP Latest Ref Rng & Units 02/19/2020 02/18/2020 02/18/2020  Glucose 70 - 99 mg/dL 305(H) 305(H) -  BUN 8 - 23 mg/dL 88(H) 85(H) -  Creatinine 0.61 - 1.24 mg/dL 15.72(H) 15.29(H) -  Sodium 135 - 145 mmol/L 135 136 -  Potassium 3.5 - 5.1 mmol/L 5.7(H) 6.0(H) 5.8(H)  Chloride 98 - 111 mmol/L 88(L) 92(L) -  CO2 22 - 32 mmol/L 20(L) 19(L) -  Calcium 8.9 - 10.3 mg/dL 6.8(L) 6.7(L) -  Total Protein 6.5 - 8.1 g/dL - 6.4(L) -  Total  Bilirubin 0.3 - 1.2 mg/dL - 1.1 -  Alkaline Phos 38 - 126 U/L - 96 -  AST 15 - 41 U/L - 31 -  ALT 0 - 44 U/L - 15 -    BP (!) 181/64 (BP Location: Right Arm)   Pulse 73   Temp 99.1 F (37.3 C) (Oral)   Resp (!) 24   Ht 6' (1.829 m)   Wt 132.8 kg   SpO2 96%   BMI 39.71 kg/m    I/O last 3 completed shifts: In: 2849.7 [I.V.:2024.7; NG/GT:625; IV Piggyback:200] Out: 600 [Emesis/NG output:600] No intake/output data recorded.  SpO2: 96 % FiO2 (%): 75 %  Estimated body mass index is 39.71 kg/m as calculated from the following:   Height as of this encounter: 6' (1.829 m).   Weight as of this encounter: 132.8 kg.  SIGNIFICANT EVENTS   REVIEW OF SYSTEMS  PATIENT IS UNABLE TO PROVIDE COMPLETE REVIEW OF SYSTEMS DUE TO SEVERE CRITICAL ILLNESS        PHYSICAL EXAMINATION:  GENERAL:critically ill appearing, +resp distress NECK: Supple.  PULMONARY: +rhonchi, +wheezing CARDIOVASCULAR: S1 and S2. Regular rate and rhythm. No murmurs, rubs, or gallops.  GASTROINTESTINAL: Soft, nontender, -distended.  Positive bowel sounds.   MUSCULOSKELETAL: No swelling, clubbing, or edema.  NEUROLOGIC: obtunded, GCS<8 SKIN:intact,warm,dry  MEDICATIONS: I have reviewed all medications and confirmed regimen as documented   CULTURE RESULTS   Recent Results (from the past 240 hour(s))  SARS CORONAVIRUS 2 (TAT 6-24 HRS) Nasopharyngeal Nasopharyngeal Swab     Status: None   Collection Time: 02/12/20 11:16 AM   Specimen: Nasopharyngeal Swab  Result Value Ref Range Status   SARS Coronavirus 2 NEGATIVE NEGATIVE Final    Comment: (NOTE) SARS-CoV-2 target nucleic acids are NOT DETECTED.  The SARS-CoV-2 RNA is generally detectable in upper and lower respiratory specimens during the acute phase of infection. Negative results do not preclude SARS-CoV-2 infection, do not rule out co-infections with other pathogens, and should not be used as the sole basis for treatment or other patient management  decisions. Negative results must be combined with clinical observations, patient history, and epidemiological information. The expected result is Negative.  Fact Sheet for Patients: SugarRoll.be  Fact Sheet for Healthcare Providers: https://www.woods-mathews.com/  This test is not yet approved or cleared by the [place] FDA and  has been authorized for detection and/or diagnosis of SARS-CoV-2 by FDA under an Emergency Use Authorization (EUA). This EUA will remain  in effect (meaning this test can be used) for the duration of the COVID-19 declaration under Se ction 564(b)(1) of the Act, 21 U.S.C. section 360bbb-3(b)(1), unless the authorization is terminated or revoked sooner.  Performed at [facility], [place] [address] [place], [place] [address]   Culture, blood (routine x 2)     Status: None (Preliminary result)   Collection Time: 02/22/2020 12:29 PM  Specimen: BLOOD  Result Value Ref Range Status   Specimen Description BLOOD RIGHT ANTECUBITAL  Final   Special Requests   Final    BOTTLES DRAWN AEROBIC AND ANAEROBIC Blood Culture adequate volume   Culture   Final    NO GROWTH 4 DAYS Performed at [facility], [address] [place], [place] [address]    Report Status PENDING  Incomplete  Culture, blood (routine x 2)     Status: None (Preliminary result)   Collection Time: 02/01/2020 12:30 PM   Specimen: BLOOD  Result Value Ref Range Status   Specimen Description BLOOD BLOOD RIGHT HAND  Final   Special Requests   Final    BOTTLES DRAWN AEROBIC AND ANAEROBIC Blood Culture adequate volume   Culture   Final    NO GROWTH 4 DAYS Performed at [facility], [address] [place], [place] [address]    Report Status PENDING  Incomplete  MRSA PCR Screening     Status: None   Collection Time: 02/29/2020 12:48 PM   Specimen: Nasal Mucosa; Nasopharyngeal  Result Value Ref Range Status   MRSA by PCR NEGATIVE  NEGATIVE Final    Comment:        The GeneXpert MRSA Assay (FDA approved for NASAL specimens only), is one component of a comprehensive MRSA colonization surveillance program. It is not intended to diagnose MRSA infection nor to guide or monitor treatment for MRSA infections. Performed at [facility], [address] [place], [place] [address]   Culture, respiratory (non-expectorated)     Status: None (Preliminary result)   Collection Time: 02/18/20  2:11 PM   Specimen: Tracheal Aspirate; Respiratory  Result Value Ref Range Status   Specimen Description   Final    TRACHEAL ASPIRATE Performed at [facility], [address] [place], [place] [address]    Special Requests   Final    NONE Performed at [facility], [address] [place], [place] [address]    Gram Stain   Final    ABUNDANT WBC PRESENT, PREDOMINANTLY PMN ABUNDANT GRAM POSITIVE COCCI FEW YEAST Performed at [facility], [place] [address] [place], [place] [address]    Culture PENDING  Incomplete   Report Status PENDING  Incomplete          IMAGING    CT ANGIO CHEST PE W OR WO CONTRAST  Result Date: 02/18/2020 CLINICAL DATA:  Respiratory failure EXAM: CT ANGIOGRAPHY CHEST WITH CONTRAST TECHNIQUE: Multidetector CT imaging of the chest was performed using the standard protocol during bolus administration of intravenous contrast. Multiplanar CT image reconstructions and MIPs were obtained to evaluate the vascular anatomy. CONTRAST:  37m OMNIPAQUE IOHEXOL 350 MG/ML SOLN COMPARISON:  11/19/2019 FINDINGS: Cardiovascular: Moderate coronary artery calcifications, most pronounced in the left anterior descending coronary artery. Aortic calcifications. No aneurysm. Mild cardiomegaly. Mediastinum/Nodes: No mediastinal, hilar, or axillary adenopathy. Endotracheal tube tip is in the midtrachea. Thyroid unremarkable. Lungs/Pleura: Lower lobe volume loss and consolidation bilaterally. This  could reflect atelectasis, pneumonia, or aspiration. No effusions. Upper Abdomen: Subtle nodularity of the liver suggesting cirrhosis as seen on prior study. Prior cholecystectomy. Small amount of perihepatic ascites. Musculoskeletal: Chest wall soft tissues are unremarkable. No acute bony abnormality. Review of the MIP images confirms the above findings. IMPRESSION: No evidence of pulmonary embolus. Cardiomegaly, coronary artery disease. Volume loss and dense consolidation in the lower lobes which could reflect atelectasis, pneumonia or aspiration. Nodular contours of the liver suggest cirrhosis. Small amount  of Peri hepatic ascites. Aortic Atherosclerosis (ICD10-I70.0). Electronically Signed   By: Rolm Baptise M.D.   On: 02/18/2020 17:46   ECHOCARDIOGRAM LIMITED  Result Date: 02/18/2020    ECHOCARDIOGRAM LIMITED REPORT   Patient Name:   LUISCARLOS KACZMARCZYK Date of Exam: 02/18/2020 Medical Rec #:  [id number]      Height:       72.0 in Accession #:    [id number]     Weight:       292.3 lb Date of Birth:  Oct 25, 1955      BSA:          2.504 m Patient Age:    70 years       BP:           Not listed in chart/Not listed in                                              chart mmHg Patient Gender: M              HR:           73 bpm. Exam Location:  [facility] Procedure: Cardiac Doppler, Color Doppler and Limited Echo Indications:     Cardiac Arrest 146.9  History:         Patient has prior history of Echocardiogram examinations, most                  recent 02/06/2020. Risk Factors:Sleep Apnea. Chronic diastolic                  CHF.  Sonographer:     Sherrie Sport RDCS (AE) Referring Phys:  [place] Diagnosing Phys: Bartholome Bill MD IMPRESSIONS  1. Left ventricular ejection fraction, by estimation, is 40 to 45%. The left ventricle has mild to moderately decreased function. The left ventricle demonstrates global hypokinesis. There is moderate left ventricular hypertrophy.  2. Right ventricular systolic function is  normal. The right ventricular size is mildly enlarged.  3. Left atrial size was mildly dilated.  4. The mitral valve was not well visualized. Trivial mitral valve regurgitation.  5. The aortic valve was not well visualized. Aortic valve regurgitation is trivial. FINDINGS  Left Ventricle: Left ventricular ejection fraction, by estimation, is 40 to 45%. The left ventricle has mild to moderately decreased function. The left ventricle demonstrates global hypokinesis. There is moderate left ventricular hypertrophy. Right Ventricle: The right ventricular size is mildly enlarged. Right vetricular wall thickness was not well visualized. Right ventricular systolic function is normal. Left Atrium: Left atrial size was mildly dilated. Right Atrium: Right atrial size was normal in size. Pericardium: There is no evidence of pericardial effusion. Mitral Valve: The mitral valve was not well visualized. Trivial mitral valve regurgitation. Tricuspid Valve: The tricuspid valve is not well visualized. Tricuspid valve regurgitation is mild. Aortic Valve: The aortic valve was not well visualized. Aortic valve regurgitation is trivial. Pulmonic Valve: The pulmonic valve was not well visualized. Pulmonic valve regurgitation is trivial. IAS/Shunts: The interatrial septum was not assessed. LEFT VENTRICLE PLAX 2D LVIDd:         4.86 cm      Diastology LVIDs:         4.18 cm      LV e' medial:    5.66 cm/s LV PW:  1.81 cm      LV E/e' medial:  12.9 LV IVS:        2.12 cm      LV e' lateral:   11.90 cm/s LVOT diam:     2.30 cm      LV E/e' lateral: 6.1 LVOT Area:     4.15 cm  LV Volumes (MOD) LV vol d, MOD A2C: 162.0 ml LV vol d, MOD A4C: 178.0 ml LV vol s, MOD A2C: 84.5 ml LV vol s, MOD A4C: 111.0 ml LV SV MOD A2C:     77.5 ml LV SV MOD A4C:     178.0 ml LV SV MOD BP:      80.7 ml LEFT ATRIUM             Index       RIGHT ATRIUM           Index LA diam:        4.00 cm 1.60 cm/m  RA Area:     29.00 cm LA Vol (A2C):   75.3 ml 30.08  ml/m RA Volume:   85.70 ml  34.23 ml/m LA Vol (A4C):   96.6 ml 38.59 ml/m LA Biplane Vol: 88.2 ml 35.23 ml/m                        PULMONIC VALVE AORTA                 PV Vmax:        0.59 m/s Ao Root diam: 3.30 cm PV Peak grad:   1.4 mmHg                       RVOT Peak grad: 3 mmHg  MITRAL VALVE               TRICUSPID VALVE MV Area (PHT): 2.52 cm    TR Peak grad:   14.6 mmHg MV Decel Time: 301 msec    TR Vmax:        191.00 cm/s MV E velocity: 72.80 cm/s MV A velocity: 49.00 cm/s  SHUNTS MV E/A ratio:  1.49        Systemic Diam: 2.30 cm Bartholome Bill MD Electronically signed by Bartholome Bill MD Signature Date/Time: 02/18/2020/1:21:04 PM    Final      Nutrition Status: Nutrition Problem: Inadequate oral intake Etiology: inability to eat (pt sedated and ventilated) Signs/Symptoms: NPO status       Indwelling Urinary Catheter continued, requirement due to   Reason to continue Indwelling Urinary Catheter strict Intake/Output monitoring for hemodynamic instability   Central Line/ continued, requirement due to  Reason to continue [place] of central venous pressure or other hemodynamic parameters and poor IV access   Ventilator continued, requirement due to severe respiratory failure   Ventilator Sedation RASS 0 to -2      ASSESSMENT AND PLAN SYNOPSIS ACUTE SEVERE CARDIAC ARREST DUE TO ACUTE ISCHEMIC CARDIOMYOPATHY Acute and severe hypoxic resp failure, complicated by aspiration pneumonia with  FINDINGS STILL CONCERNING FOR ANOXIC BRAIN INJURY WITH MULTIORGAN FAILURE  Severe ACUTE Hypoxic and Hypercapnic Respiratory Failure -continue Full MV support -continue Bronchodilator Therapy -Wean Fio2 and PEEP as tolerated -VAP/VENT bundle implementation  ACUTE SYSTOLIC CARDIAC FAILURE- EF 45% -oxygen as needed -follow up cardiology recs   Morbid obesity, possible OSA.   Will certainly impact respiratory mechanics, ventilator weaning Suspect will need to consider  additional PEEP  END STAGE KIDNEY INJURY/Renal Failure -continue Foley Catheter-assess need -Avoid nephrotoxic agents -Follow urine output, BMP -Ensure adequate renal perfusion, optimize oxygenation -Renal dose medications PD/HD as needed     NEUROLOGY Acute toxic metabolic encephalopathy, need for sedation Goal RASS -2 to -3 Signs of anoxic brain injury Obtain EEG, NEUROLOGY CONSULTED   CARDIAC ICU monitoring  ID ASPIRATION PNEUMNONIA -continue IV abx as prescibed -follow up cultures  GI GI PROPHYLAXIS as indicated  DIET-->TF's as tolerated Constipation protocol as indicated  ENDO - will use ICU hypoglycemic\Hyperglycemia protocol if indicated     ELECTROLYTES -follow labs as needed -replace as needed -pharmacy consultation and following   DVT/GI PRX ordered and assessed TRANSFUSIONS AS NEEDED MONITOR FSBS I Assessed the need for Labs I Assessed the need for Foley I Assessed the need for Central Venous Line Family Discussion when available I Assessed the need for Mobilization I made an Assessment of medications to be adjusted accordingly Safety Risk assessment completed   CASE DISCUSSED IN MULTIDISCIPLINARY ROUNDS WITH ICU TEAM  Critical Care Time devoted to patient care services described in this note is 75 minutes.   Overall, patient is critically ill, prognosis is guarded.  Patient with Multiorgan failure and at high risk for cardiac arrest and death.    Corrin Parker, M.D.  [facility]  Medical Director [place] Director [facility] Cardio-Pulmonary Department

## 2020-02-19 NOTE — Progress Notes (Addendum)
Inpatient Diabetes Program Recommendations  AACE/ADA: New Consensus Statement on Inpatient Glycemic Control  Target Ranges:  Prepandial:   less than 140 mg/dL      Peak postprandial:   less than 180 mg/dL (1-2 hours)      Critically ill patients:  140 - 180 mg/dL   Results for Patrick Hurst, Patrick Hurst (MRN [id number]) as of 02/19/2020 10:16  Ref. Range 02/18/2020 07:18 02/18/2020 12:34 02/18/2020 16:11 02/18/2020 16:21 02/18/2020 19:32 02/18/2020 23:40 02/19/2020 03:17 02/19/2020 07:09  Glucose-Capillary Latest Ref Range: 70 - 99 mg/dL 203 (H) 152 (H) 137 (H) 158 (H) 228 (H) 256 (H) 294 (H) 231 (H)   Review of Glycemic Control  Diabetes history: DM2 Outpatient Diabetes medications: Humulin R U500 60 units BID Current orders for Inpatient glycemic control: Novolog 0-6 units Q4H; Solumedrol 20 mg Q12H, Nepro @ 30 ml/hr  Inpatient Diabetes Program Recommendations:    Insulin: Please consider ordering Novolog 3 units Q4H for tube feeding coverage. If tube feeding is held or stopped then Novolog tube feeding coverage should also be held or stopped. If steroids are continued, please consider ordering Levemir 5 units Q24H.  Thanks, Barnie Alderman, RN, MSN, CDE Diabetes Coordinator Inpatient Diabetes Program [phone] (Team Pager from 8am to 5pm)

## 2020-02-19 NOTE — Consult Note (Addendum)
NEURO HOSPITALIST CONSULT NOTE   Requestig physician: Dr. 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 male with a PMHx of CHF, atrial fibrillation (on Eliquis), NASH cirrhosis, OSA and recent admission in August 2021 for acute hypoxic respiratory failure and symptomatic anemia, who had presented initially for an outpatient colonoscopy on 10/14. During the procedure he experienced PEA arrest requiring 10 minutes of CPR prior to ROSC. He was intubated at that time. He was unresponsive post-arrest and TTM was started. He has been managed in the ICU since the arrest. He has not regained consciousness.   MRI brain on Sunday 10/17 revealed no acute abnormality. A remote right basal ganglia insult was noted.   EEG on 10/14 revealed very slow, low voltage background activity that was consistent with a medication effect, but could not rule out a severe diffuse cerebral disturbance as well, etiologically nonspecific. No epileptiform activity was noted.   Past Medical History:  Diagnosis Date  . Acute on chronic diastolic CHF (congestive heart failure) ([employer]) 06/21/2018  . Acute respiratory failure with hypoxia ([place]) 06/21/2018  . Bell palsy 02/12/2015  . Carpal tunnel syndrome 02/12/2015  . Dependence on nocturnal oxygen therapy    2 LITERS WITH BIPAP  . Depression   . Gastric ulcer   . GERD (gastroesophageal reflux disease)   . Gout   . History of chicken pox   . Multifocal pneumonia 03/04/2019  . Sleep apnea treated with nocturnal BiPAP    uses bipap    Past Surgical History:  Procedure Laterality Date  . AV FISTULA PLACEMENT Left 11/08/2019   Procedure: ARTERIOVENOUS (AV) FISTULA CREATION (RADIALCEPHALIC);  Surgeon: Algernon Huxley, MD;   Location: [facility] ORS;  Service: Vascular;  Laterality: Left;  . CATARACT EXTRACTION Bilateral 2014 and 2015  . CHOLECYSTECTOMY  04/28/2011   Laproscopic; Dr. Pat Patrick  . COLONOSCOPY WITH PROPOFOL N/A 12/30/2019   Procedure: COLONOSCOPY WITH PROPOFOL;  Surgeon: Lin Landsman, MD;  Location: [facility] ENDOSCOPY;  Service: Gastroenterology;  Laterality: N/A;  . DIALYSIS/PERMA CATHETER INSERTION N/A 05/24/2019   Procedure: DIALYSIS/PERMA CATHETER INSERTION;  Surgeon: Algernon Huxley, MD;  Location: [place] [facility] LAB;  Service: Cardiovascular;  Laterality: N/A;  . DIALYSIS/PERMA CATHETER INSERTION N/A 10/09/2019   Procedure: DIALYSIS/PERMA CATHETER INSERTION;  Surgeon: Katha Cabal, MD;  Location: [place] [facility];  Service: Cardiovascular;  Laterality: N/A;  . ESOPHAGOGASTRODUODENOSCOPY (EGD) WITH PROPOFOL N/A 12/29/2019   Procedure: ESOPHAGOGASTRODUODENOSCOPY (EGD) WITH PROPOFOL;  Surgeon: Lin Landsman, MD;  Location: [facility] ENDOSCOPY;  Service: Gastroenterology;  Laterality: N/A;  . EYE SURGERY    . GALLBLADDER SURGERY    . LASIK Bilateral 2019   medical  . SHOULDER SURGERY Right 2012   Dr. Leanor Kail    Family History  Problem Relation Age of Onset  . COPD Mother   . Cancer Mother   . Heart disease Father               Social History:  reports that he quit smoking about 41 years ago. His smoking use included cigarettes. He has a 15.00 pack-year smoking history. He has never used smokeless tobacco. He reports that he does not drink alcohol and does not use drugs.  Allergies  Allergen Reactions  . Mucinex [Guaifenesin Er] Swelling    Throat swelling, increases heart rate  . Levaquin [Levofloxacin] Palpitations    MEDICATIONS:                                                                                                                     Scheduled: . sodium chloride   Intravenous Once  . amiodarone  200 mg Per Tube BID  . chlorhexidine gluconate (MEDLINE KIT)  15 mL  Mouth Rinse BID  . Chlorhexidine Gluconate Cloth  6 each Topical Daily  . docusate  100 mg Per Tube BID  . feeding supplement (NEPRO CARB STEADY)  1,000 mL Per Tube Q24H  . feeding supplement (PROSource TF)  90 mL Per Tube 5 X Daily  . fentaNYL (SUBLIMAZE) injection  100 mcg Intravenous Once  . gentamicin cream  1 application Topical Daily  . heparin  5,000 Units Subcutaneous Q8H  . insulin aspart  0-6 Units Subcutaneous Q4H  . ipratropium-albuterol  3 mL Nebulization Q4H  . mouth rinse  15 mL Mouth Rinse 10 times per day  . methylPREDNISolone (SOLU-MEDROL) injection  20 mg Intravenous Q12H  . multivitamin  1 tablet Per Tube QHS  . polyethylene glycol  17 g Per Tube Daily   Continuous: . sodium chloride    . sodium chloride 10 mL/hr at 02/15/20 0600  . ampicillin-sulbactam (UNASYN) IV Stopped (02/18/20 1753)  . dexmedetomidine (PRECEDEX) IV infusion 1 mcg/kg/hr (02/19/20 1043)  . dialysis solution 1.5% low-MG/low-CA    . dialysis solution 2.5% low-MG/low-CA    . famotidine (PEPCID) IV Stopped (02/18/20 1706)  . fentaNYL infusion INTRAVENOUS 250 mcg/hr (02/19/20 1014)  . norepinephrine (LEVOPHED) Adult infusion Stopped (02/18/20 1527)  . vasopressin Stopped (02/18/20 1710)     ROS:  Unable to obtain due to coma.     Blood pressure (!) 181/64, pulse 81, temperature 99.3 F (37.4 C), temperature source Axillary, resp. rate (!) 37, height 6' (1.829 m), weight 132.8 kg, SpO2 (!) 81 %.   General Examination:                                                                                                       Physical Exam  General: Morbidly obese HEENT-  [place]/AT .   Lungs- Intubated Extremities- Chronic pigmentary changes to knees and distal BLE noted.   Neurological Examination Mental Status: Comatose. No responses to any external stimuli.   Cranial Nerves: II: Pupils pinpoint and unreactive. No blink to threat.   III,IV, VI: No doll's eye reflex present.  V,VII: Weak corneal reflex on the left. No corneal reflex on the right.  VIII: No response to voice or loud clap.  IX,X: Intubated XI: Unable to assess XII: Intubated Motor/Sensory: Flaccid tone x 4.  No posturing or myoclonus noted.  No movement to any stimuli.  Deep Tendon Reflexes: 0 bilateral brachioradialis, biceps and patellae. Toes mute bilaterally.  Cerebellar/Gait: Unable to assess.    Lab Results: Basic Metabolic Panel: Recent Labs  Lab 02/15/20 0100 02/15/20 0440 02/15/20 1409 02/15/20 1409 02/16/20 0444 02/16/20 0444 02/17/20 0415 02/17/20 0415 02/18/20 0351 02/18/20 1454 02/18/20 2300 02/19/20 0450  NA   < >  --  137   < > 139  --  136  --  134*  --  136 135  K   < >  --  5.4*   < > 4.6   < > 4.9  --  5.6* 5.8* 6.0* 5.7*  CL   < >  --  86*   < > 92*  --  90*  --  88*  --  92* 88*  CO2   < >  --  21*   < > 21*  --  20*  --  20*  --  19* 20*  GLUCOSE   < >  --  143*   < > 157*  --  178*  --  195*  --  305* 305*  BUN   < >  --  90*   < > 51*  --  61*  --  68*  --  85* 88*  CREATININE   < >  --  18.60*   < > 12.34*  --  13.95*  --  15.33*  --  15.29* 15.72*  CALCIUM   < >  --  6.4*   < > 7.3*   < > 6.7*   < > 6.9*  --  6.7* 6.8*  MG  --  2.8*  --   --  2.3  --  2.0  --  2.3  --   --  2.4  PHOS   < > 13.5* 13.5*  --  10.2*  --  10.0*  --  12.7*  --   --  14.1*   < > = values in this interval not displayed.  CBC: Recent Labs  Lab 02/15/20 0045 02/15/20 0440 02/15/20 1409 02/16/20 0444 02/17/20 0415 02/18/20 0351 02/19/20 0450  WBC 6.6   < > 6.4 8.9 6.9 6.9 4.7  NEUTROABS 5.2  --   --  7.0 4.9 5.7 4.0  HGB 10.0*   < > 8.4* 9.4* 8.0* 7.9* 7.1*  HCT 30.5*   < > 24.4* 27.9* 22.0* 23.5* 20.9*  MCV 93.3   < > 91.0 93.0 92.8 93.3 93.3  PLT 125*   < > 123* 135* 92* 84* 94*   < > = values in this interval not displayed.    Cardiac  Enzymes: Recent Labs  Lab 02/07/2020 1228  CKMB 33.0*    Lipid Panel: Recent Labs  Lab 02/19/20 0450  TRIG 664*    Imaging: DG Chest 1 View  Result Date: 02/18/2020 CLINICAL DATA:  Oxygen desaturation EXAM: CHEST  1 VIEW COMPARISON:  Yesterday FINDINGS: The endotracheal tube tip is at the clavicular heads. The enteric tube tip is not visualized beyond the lower esophagus. Dialysis catheter on the right with tip at the upper right atrium. Cardiopericardial enlargement. Increased density at the bases, especially on the left. No visible effusion or pneumothorax. These results will be called to the ordering clinician or representative by the Radiologist Assistant, and communication documented in the PACS or [employer]. IMPRESSION: 1. The enteric tube tip appears to terminate at the lower esophagus, but certainty is limited by patient anatomy. Consider advancement and KUB. 2. Cardiomegaly with mild interval lower lobe opacification, atelectasis or infiltrate. Electronically Signed   By: Monte Fantasia M.D.   On: 02/18/2020 05:50   DG Abd 1 View  Result Date: 02/18/2020 CLINICAL DATA:  OG tube placement EXAM: ABDOMEN - 1 VIEW COMPARISON:  02/07/2020 FINDINGS: Enteric tube tip is in the left upper quadrant consistent with location in the upper stomach. There has been advancement since the previous study. Right central venous catheter with tip over the cavoatrial junction region. Visualized bowel are not abnormally distended. Vascular calcifications in the upper abdomen. Old left rib fractures. Consolidation and air bronchograms in the left lung base. IMPRESSION: Enteric tube tip is been advanced and now in satisfactory apparent position. Electronically Signed   By: Lucienne Capers M.D.   On: 02/18/2020 06:48   CT ANGIO CHEST PE W OR WO CONTRAST  Result Date: 02/18/2020 CLINICAL DATA:  Respiratory failure EXAM: CT ANGIOGRAPHY CHEST WITH CONTRAST TECHNIQUE: Multidetector CT imaging of the  chest was performed using the standard protocol during bolus administration of intravenous contrast. Multiplanar CT image reconstructions and MIPs were obtained to evaluate the vascular anatomy. CONTRAST:  78m OMNIPAQUE IOHEXOL 350 MG/ML SOLN COMPARISON:  11/19/2019 FINDINGS: Cardiovascular: Moderate coronary artery calcifications, most pronounced in the left anterior descending coronary artery. Aortic calcifications. No aneurysm. Mild cardiomegaly. Mediastinum/Nodes: No mediastinal, hilar, or axillary adenopathy. Endotracheal tube tip is in the midtrachea. Thyroid unremarkable. Lungs/Pleura: Lower lobe volume loss and consolidation bilaterally. This could reflect atelectasis, pneumonia, or aspiration. No effusions. Upper Abdomen: Subtle nodularity of the liver suggesting cirrhosis as seen on prior study. Prior cholecystectomy. Small amount of perihepatic ascites. Musculoskeletal: Chest wall soft tissues are unremarkable. No acute bony abnormality. Review of the MIP images confirms the above findings. IMPRESSION: No evidence of pulmonary embolus. Cardiomegaly, coronary artery disease. Volume loss and dense consolidation in the lower lobes which could reflect atelectasis, pneumonia or aspiration. Nodular contours of the liver suggest cirrhosis. Small amount of Peri hepatic ascites. Aortic Atherosclerosis (ICD10-I70.0). Electronically Signed  By: Rolm Baptise M.D.   On: 02/18/2020 17:46   MR BRAIN WO CONTRAST  Result Date: 02/17/2020 CLINICAL DATA:  Neuro deficit.  Stroke suspected. EXAM: MRI HEAD WITHOUT CONTRAST TECHNIQUE: Multiplanar, multiecho pulse sequences of the brain and surrounding structures were obtained without intravenous contrast. COMPARISON:  03/02/2020 head CT and prior.  03/13/2012 MRI head. FINDINGS: Brain: No diffusion-weighted signal abnormality. No intracranial hemorrhage. No midline shift, ventriculomegaly or extra-axial fluid collection. No mass lesion. Remote right basal ganglia insult.  Vascular: Grossly preserved major intracranial flow voids. Skull and upper cervical spine: Normal marrow signal. Sinuses/Orbits: Sequela of bilateral lens replacement. Bilateral proptosis, unchanged. Mild to moderate pansinus mucosal thickening. Bilateral mastoid effusions. Other: None. IMPRESSION: No acute intracranial process. Remote right basal ganglia insult. Electronically Signed   By: Primitivo Gauze M.D.   On: 02/17/2020 15:02   ECHOCARDIOGRAM LIMITED  Result Date: 02/18/2020    ECHOCARDIOGRAM LIMITED REPORT   Patient Name:   STIVEN KASPAR Date of Exam: 02/18/2020 Medical Rec #:  [id number]      Height:       72.0 in Accession #:    [id number]     Weight:       292.3 lb Date of Birth:  [birth year]      BSA:          2.504 m Patient Age:    7 years       BP:           Not listed in chart/Not listed in                                              chart mmHg Patient Gender: M              HR:           73 bpm. Exam Location:  [facility] Procedure: Cardiac Doppler, Color Doppler and Limited Echo Indications:     Cardiac Arrest 146.9  History:         Patient has prior history of Echocardiogram examinations, most                  recent 02/09/2020. Risk Factors:Sleep Apnea. Chronic diastolic                  CHF.  Sonographer:     Sherrie Sport RDCS (AE) Referring Phys:  [place] Diagnosing Phys: Bartholome Bill MD IMPRESSIONS  1. Left ventricular ejection fraction, by estimation, is 40 to 45%. The left ventricle has mild to moderately decreased function. The left ventricle demonstrates global hypokinesis. There is moderate left ventricular hypertrophy.  2. Right ventricular systolic function is normal. The right ventricular size is mildly enlarged.  3. Left atrial size was mildly dilated.  4. The mitral valve was not well visualized. Trivial mitral valve regurgitation.  5. The aortic valve was not well visualized. Aortic valve regurgitation is trivial. FINDINGS  Left Ventricle: Left ventricular ejection  fraction, by estimation, is 40 to 45%. The left ventricle has mild to moderately decreased function. The left ventricle demonstrates global hypokinesis. There is moderate left ventricular hypertrophy. Right Ventricle: The right ventricular size is mildly enlarged. Right vetricular wall thickness was not well visualized. Right ventricular systolic function is normal. Left Atrium: Left atrial size was mildly dilated. Right Atrium: Right atrial size was normal in size.  Pericardium: There is no evidence of pericardial effusion. Mitral Valve: The mitral valve was not well visualized. Trivial mitral valve regurgitation. Tricuspid Valve: The tricuspid valve is not well visualized. Tricuspid valve regurgitation is mild. Aortic Valve: The aortic valve was not well visualized. Aortic valve regurgitation is trivial. Pulmonic Valve: The pulmonic valve was not well visualized. Pulmonic valve regurgitation is trivial. IAS/Shunts: The interatrial septum was not assessed. LEFT VENTRICLE PLAX 2D LVIDd:         4.86 cm      Diastology LVIDs:         4.18 cm      LV e' medial:    5.66 cm/s LV PW:         1.81 cm      LV E/e' medial:  12.9 LV IVS:        2.12 cm      LV e' lateral:   11.90 cm/s LVOT diam:     2.30 cm      LV E/e' lateral: 6.1 LVOT Area:     4.15 cm  LV Volumes (MOD) LV vol d, MOD A2C: 162.0 ml LV vol d, MOD A4C: 178.0 ml LV vol s, MOD A2C: 84.5 ml LV vol s, MOD A4C: 111.0 ml LV SV MOD A2C:     77.5 ml LV SV MOD A4C:     178.0 ml LV SV MOD BP:      80.7 ml LEFT ATRIUM             Index       RIGHT ATRIUM           Index LA diam:        4.00 cm 1.60 cm/m  RA Area:     29.00 cm LA Vol (A2C):   75.3 ml 30.08 ml/m RA Volume:   85.70 ml  34.23 ml/m LA Vol (A4C):   96.6 ml 38.59 ml/m LA Biplane Vol: 88.2 ml 35.23 ml/m                        PULMONIC VALVE AORTA                 PV Vmax:        0.59 m/s Ao Root diam: 3.30 cm PV Peak grad:   1.4 mmHg                       RVOT Peak grad: 3 mmHg  MITRAL VALVE                TRICUSPID VALVE MV Area (PHT): 2.52 cm    TR Peak grad:   14.6 mmHg MV Decel Time: 301 msec    TR Vmax:        191.00 cm/s MV E velocity: 72.80 cm/s MV A velocity: 49.00 cm/s  SHUNTS MV E/A ratio:  1.49        Systemic Diam: 2.30 cm Bartholome Bill MD Electronically signed by Bartholome Bill MD Signature Date/Time: 02/18/2020/1:21:04 PM    Final     Assessment: 64 year old male s/p cardiac arrest x 10 minutes, now unresponsive on the ventilator with depressed brainstem reflexes. He is s/p TTM 1. Exam reveals trace left corneal reflex. Otherwise, no brainstem reflexes are present. No evidence for higher cortical function on exam.  2. MRI brain on 10/17 did not show the expected signal abnormalities corresponding to what is most likely a case of  severe anoxic brain injury.  3. Last EEG was on 10/14, revealing very slow, low voltage background activity. No electrographic seizures were seen. 4. No myoclonus seen on today's exam.  5. Overall presentation is suggestive of anoxic brain injury, but with negative MRI, residual sedation is also on the DDx.   Recommendations: 1. Repeat EEG.  2. Continue supportive care.  3. A repeat MRI brain is unlikely to show abnormalities if the initial MRI 3 days out was negative.  4. No myoclonus seen on today's exam. If myoclonus recurs, a valproic acid IV load of 20 mg/kg followed by scheduled dosing at 5 mg/kg per tube TID may be of benefit.  5. Eliminate all sedation if possible. Would caution against use of benzodiazepines as these can remain in the bloodstream for extended periods of time in ESRD patients, requiring an extensive wash out period to ensure that no residual sedation is confounding the exam. In that context, a repeat neurological exam for prognostication can be performed after the patient has been off all sedation for 72 hours. Please call the Neurology team at that time.   40 minutes spent in the neurological evaluation and management of this critically  ill patient.   Addendum: 1. EEG completed: ABNORMALITY - Seizure, generalized, most likely myoclonus - Periodic epileptiform discharges with plus pattern, generalized  - Background suppression, generalized  IMPRESSION: This study showed two myoclonic seizures with generalized onset, at 1455 and 1504, lasting 45 seconds and 1.5 mins respectively. Video shows correlate of eye opening and rhythmic arm jerking. 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  2. Starting valproic acid with 20 mg/kg IV load followed by 5 mg/kg per tube TID (ordered) 3. Repeat EEG in AM (ordered) 4. Ammonia level (ordered)   Electronically signed: Dr. Kerney Elbe 02/19/2020, 10:38 AM

## 2020-02-19 NOTE — Procedures (Signed)
Pt having dialysis-will do eeg later

## 2020-02-20 ENCOUNTER — Inpatient Hospital Stay: Payer: Commercial Managed Care - PPO

## 2020-02-20 DIAGNOSIS — K7460 Unspecified cirrhosis of liver: Secondary | ICD-10-CM

## 2020-02-20 DIAGNOSIS — I469 Cardiac arrest, cause unspecified: Secondary | ICD-10-CM | POA: Diagnosis not present

## 2020-02-20 DIAGNOSIS — L8990 Pressure ulcer of unspecified site, unspecified stage: Secondary | ICD-10-CM | POA: Insufficient documentation

## 2020-02-20 DIAGNOSIS — R188 Other ascites: Secondary | ICD-10-CM | POA: Diagnosis not present

## 2020-02-20 DIAGNOSIS — G931 Anoxic brain damage, not elsewhere classified: Secondary | ICD-10-CM | POA: Diagnosis not present

## 2020-02-20 LAB — CBC WITH DIFFERENTIAL/PLATELET
Abs Immature Granulocytes: 0.13 10*3/uL — ABNORMAL HIGH (ref 0.00–0.07)
Basophils Absolute: 0 10*3/uL (ref 0.0–0.1)
Basophils Relative: 0 %
Eosinophils Absolute: 0 10*3/uL (ref 0.0–0.5)
Eosinophils Relative: 0 %
HCT: 20.4 % — ABNORMAL LOW (ref 39.0–52.0)
Hemoglobin: 6.3 g/dL — ABNORMAL LOW (ref 13.0–17.0)
Immature Granulocytes: 3 %
Lymphocytes Relative: 6 %
Lymphs Abs: 0.3 10*3/uL — ABNORMAL LOW (ref 0.7–4.0)
MCH: 30 pg (ref 26.0–34.0)
MCHC: 30.9 g/dL (ref 30.0–36.0)
MCV: 97.1 fL (ref 80.0–100.0)
Monocytes Absolute: 0.5 10*3/uL (ref 0.1–1.0)
Monocytes Relative: 10 %
Neutro Abs: 4 10*3/uL (ref 1.7–7.7)
Neutrophils Relative %: 81 %
Platelets: 91 10*3/uL — ABNORMAL LOW (ref 150–400)
RBC: 2.1 MIL/uL — ABNORMAL LOW (ref 4.22–5.81)
RDW: 16.9 % — ABNORMAL HIGH (ref 11.5–15.5)
WBC: 4.9 10*3/uL (ref 4.0–10.5)
nRBC: 1 % — ABNORMAL HIGH (ref 0.0–0.2)

## 2020-02-20 LAB — BLOOD GAS, ARTERIAL
Acid-base deficit: 3.4 mmol/L — ABNORMAL HIGH (ref 0.0–2.0)
Acid-base deficit: 3.4 mmol/L — ABNORMAL HIGH (ref 0.0–2.0)
Bicarbonate: 22.1 mmol/L (ref 20.0–28.0)
Bicarbonate: 22.7 mmol/L (ref 20.0–28.0)
FIO2: 0.45
FIO2: 0.85
MECHVT: 550 mL
MECHVT: 500 mL
O2 Saturation: 95.8 %
O2 Saturation: 98.9 %
PEEP: 5 cmH2O
PEEP: 10 cmH2O
Patient temperature: 37
Patient temperature: 37
RATE: 24 resp/min
RATE: 24 resp/min
pCO2 arterial: 41 mmHg (ref 32.0–48.0)
pCO2 arterial: 45 mmHg (ref 32.0–48.0)
pH, Arterial: 7.34 — ABNORMAL LOW (ref 7.350–7.450)
pH, Arterial: 7.31 — ABNORMAL LOW (ref 7.350–7.450)
pO2, Arterial: 85 mmHg (ref 83.0–108.0)
pO2, Arterial: 138 mmHg — ABNORMAL HIGH (ref 83.0–108.0)

## 2020-02-20 LAB — BASIC METABOLIC PANEL
Anion gap: 22 — ABNORMAL HIGH (ref 5–15)
BUN: 80 mg/dL — ABNORMAL HIGH (ref 8–23)
CO2: 22 mmol/L (ref 22–32)
Calcium: 6.7 mg/dL — ABNORMAL LOW (ref 8.9–10.3)
Chloride: 91 mmol/L — ABNORMAL LOW (ref 98–111)
Creatinine, Ser: 12.23 mg/dL — ABNORMAL HIGH (ref 0.61–1.24)
GFR, Estimated: 4 mL/min — ABNORMAL LOW (ref >60)
Glucose, Bld: 242 mg/dL — ABNORMAL HIGH (ref 70–99)
Potassium: 5.8 mmol/L — ABNORMAL HIGH (ref 3.5–5.1)
Sodium: 135 mmol/L (ref 135–145)

## 2020-02-20 LAB — TRIGLYCERIDES: Triglycerides: 520 mg/dL — ABNORMAL HIGH (ref <150)

## 2020-02-20 LAB — GLUCOSE, CAPILLARY
Glucose-Capillary: 182 mg/dL — ABNORMAL HIGH (ref 70–99)
Glucose-Capillary: 182 mg/dL — ABNORMAL HIGH (ref 70–99)
Glucose-Capillary: 192 mg/dL — ABNORMAL HIGH (ref 70–99)
Glucose-Capillary: 195 mg/dL — ABNORMAL HIGH (ref 70–99)
Glucose-Capillary: 210 mg/dL — ABNORMAL HIGH (ref 70–99)
Glucose-Capillary: 231 mg/dL — ABNORMAL HIGH (ref 70–99)

## 2020-02-20 LAB — CK: Total CK: 1869 U/L — ABNORMAL HIGH (ref 49–397)

## 2020-02-20 LAB — PREPARE RBC (CROSSMATCH)

## 2020-02-20 LAB — PHOSPHORUS: Phosphorus: 11.7 mg/dL — ABNORMAL HIGH (ref 2.5–4.6)

## 2020-02-20 LAB — MAGNESIUM: Magnesium: 2.2 mg/dL (ref 1.7–2.4)

## 2020-02-20 MED ORDER — SODIUM CHLORIDE 0.9% IV SOLUTION: Freq: Once | INTRAVENOUS | Status: AC

## 2020-02-20 NOTE — Progress Notes (Signed)
Patient Name: Patrick Hurst Date of Encounter: 02/20/2020  Hospital Problem List     Active Problems:   Cardiac arrest [facility])    Patient Profile     64 y.o.malewith history ofparoxysmal atrial fibrillation, HFpEF with an ejection fraction of 55 to 60%, hypertension, thyroid disease, diabetes, exogenous obesity, thrombocytopenia, end-stage renal disease who was admitted after presenting for GI work-up where he suffered a PEA cardiac arrest on October 14 during the procedure. He had approximately 10 minutes of CPR and intubated and required echo ventilation... Echocardiogram postarrest was read as showingpreserved LV function with EF of 50 to 55% which is low normal. No regional wall motion abnormalities. Moderate LVH. He was felt to have mild aortic sclerosis with no significant stenosis. He was placed on code ice. It is of note that he had previous admission in April of this year with acute hypoxic respiratory failure as well as symptomatic anemia. Endoscopy team was present at the onset of the arrest stated the patient became bradycardiac followed by PEA. As hemoglobin was 6.1. He had sinus rhythm on the monitor. He had had his amiodarone and Eliquis held prior to the procedure. Troponin was mildly elevated and flat consistent with demand. Did not appear to have have had an acute coronary event. EKG at the time showed sinus rhythm with nonspecific ST-T wave changes. Head CT at the time of arrest showed no mass or hemorrhage with no acute infarct. Chest x-ray postintubation showed cardiomegaly with vascular congestion. Laboratory showed a potassium of 5.9 with a creatinine of 19.17. BNP was 116.9 patient remains intubated. He haspredominantly been in sinus rhythm although has had episodes of breakthrough A. fib with RVR.   Subjective   intubated and sedated.  Episodes of seizure-like activity.  Inpatient Medications    . sodium chloride   Intravenous Once  . amiodarone   200 mg Per Tube BID  . chlorhexidine gluconate (MEDLINE KIT)  15 mL Mouth Rinse BID  . Chlorhexidine Gluconate Cloth  6 each Topical Daily  . docusate  100 mg Per Tube BID  . feeding supplement (NEPRO CARB STEADY)  1,000 mL Per Tube Q24H  . feeding supplement (PROSource TF)  90 mL Per Tube 5 X Daily  . fentaNYL (SUBLIMAZE) injection  100 mcg Intravenous Once  . gentamicin cream  1 application Topical Daily  . heparin  5,000 Units Subcutaneous Q8H  . insulin aspart  0-6 Units Subcutaneous Q4H  . ipratropium-albuterol  3 mL Nebulization Q4H  . mouth rinse  15 mL Mouth Rinse 10 times per day  . methylPREDNISolone (SOLU-MEDROL) injection  20 mg Intravenous Q12H  . multivitamin  1 tablet Per Tube QHS  . polyethylene glycol  17 g Per Tube Daily  . valproic acid  5 mg/kg Per Tube TID    Vital Signs    Vitals:   02/20/20 0300 02/20/20 0400 02/20/20 0500 02/20/20 0600  BP:      Pulse: (!) 107 (!) 110 (!) 108 (!) 104  Resp: (!) 27 (!) 24 (!) 24 (!) 24  Temp: 98.6 F (37 C)   99 F (37.2 C)  TempSrc: Oral   Oral  SpO2: 96% 97% 97% 97%  Weight:      Height:        Intake/Output Summary (Last 24 hours) at 02/20/2020 0741 Last data filed at 02/20/2020 0600 Gross per 24 hour  Intake 2606.66 ml  Output 0 ml  Net 2606.66 ml   [employer]  02/18/20 0358 02/19/20 0432 02/20/20 0248  Weight: 132.6 kg 132.8 kg 133.6 kg    Physical Exam    GEN: Critically ill male on ventilator in ICU HEENT: normal.  Neck: Supple, no JVD, carotid bruits, or masses. Cardiac: Irregular regular Respiratory: Breathing with ventilator lly. GI: Soft, nontender, nondistended, BS + x 4. MS: no deformity or atrophy. Skin: warm and dry, no rash. Neuro: Intubated and sedated  Labs    CBC Recent Labs    02/19/20 0450 02/20/20 0400  WBC 4.7 4.9  NEUTROABS 4.0 4.0  HGB 7.1* 6.3*  HCT 20.9* 20.4*  MCV 93.3 97.1  PLT 94* 91*   Basic Metabolic Panel Recent Labs    02/19/20 0450 02/20/20 0400   NA 135 135  K 5.7* 5.8*  CL 88* 91*  CO2 20* 22  GLUCOSE 305* 242*  BUN 88* 80*  CREATININE 15.72* 12.23*  CALCIUM 6.8* 6.7*  MG 2.4 2.2  PHOS 14.1* 11.7*   Liver Function Tests Recent Labs    02/18/20 2300  AST 31  ALT 15  ALKPHOS 96  BILITOT 1.1  PROT 6.4*  ALBUMIN 2.6*   No results for input(s): LIPASE, AMYLASE in the last 72 hours. Cardiac Enzymes No results for input(s): CKTOTAL, CKMB, CKMBINDEX, TROPONINI in the last 72 hours. BNP No results for input(s): BNP in the last 72 hours. D-Dimer No results for input(s): DDIMER in the last 72 hours. Hemoglobin A1C No results for input(s): HGBA1C in the last 72 hours. Fasting Lipid Panel Recent Labs    02/20/20 0400  TRIG 520*   Thyroid Function Tests No results for input(s): TSH, T4TOTAL, T3FREE, THYROIDAB in the last 72 hours.  Invalid input(s): FREET3  Telemetry    afib/nsr  ECG    nsr with no ischemia  Radiology    EEG  Result Date: 02/19/2020 Lora Havens, MD     02/19/2020  4:53 PM Patient Name: Patrick Hurst MRN: [id number] Epilepsy Attending: Lora Havens Referring Physician/Provider: Dr Flora Lipps Date: 02/19/2020 Duration: 24.51 mins Patient history: 64 year old male s/p cardiac arrest x 10 minutes, now unresponsive on the ventilator with depressed brainstem reflexes. He is s/p TTM. EEG to evaluate for seizure Level of alertness:  comatose AEDs during EEG study: None Technical aspects: This EEG study was done with scalp electrodes positioned according to the 10-20 International system of electrode placement. Electrical activity was acquired at a sampling rate of _0  and reviewed with a high frequency filter of _1  and a low frequency filter of _2 . EEG data were recorded continuously and digitally stored. Description: EEG showed generalized periodic epileptiform discharges with overriding fast activity at 1-1._3  as well as generalized background suppression. Two seizures were noted at 1455  and 1504, lasting 45 seconds and 1.5 mins respectively. On video patient was noted to have sudden eye opening, upward gaze deviation and rhythmic arm twitching. Concomitant eeg showed generalized polyspikes.  Hyperventilation and photic stimulation were not performed.   ABNORMALITY - Myoclonic Seizure, generalized - Periodic epileptiform discharges with plus pattern, generalized - Background suppression, generalized IMPRESSION: This study showed two myoclonic seizures as described above with generalized onset, at 1455 and 1504, lasting 45 seconds and 1.5 mins respectively. 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 Dr Cheral Marker was notified. Lora Havens   DG Chest 1 View  Result Date: 02/18/2020 CLINICAL DATA:  Oxygen desaturation EXAM: CHEST  1 VIEW COMPARISON:  Yesterday  FINDINGS: The endotracheal tube tip is at the clavicular heads. The enteric tube tip is not visualized beyond the lower esophagus. Dialysis catheter on the right with tip at the upper right atrium. Cardiopericardial enlargement. Increased density at the bases, especially on the left. No visible effusion or pneumothorax. These results will be called to the ordering clinician or representative by the Radiologist Assistant, and communication documented in the PACS or [employer]. IMPRESSION: 1. The enteric tube tip appears to terminate at the lower esophagus, but certainty is limited by patient anatomy. Consider advancement and KUB. 2. Cardiomegaly with mild interval lower lobe opacification, atelectasis or infiltrate. Electronically Signed   By: Monte Fantasia M.D.   On: 02/18/2020 05:50   DG Abd 1 View  Result Date: 02/18/2020 CLINICAL DATA:  OG tube placement EXAM: ABDOMEN - 1 VIEW COMPARISON:  02/11/2020 FINDINGS: Enteric tube tip is in the left upper quadrant consistent with location in the upper stomach. There has been advancement since the previous  study. Right central venous catheter with tip over the cavoatrial junction region. Visualized bowel are not abnormally distended. Vascular calcifications in the upper abdomen. Old left rib fractures. Consolidation and air bronchograms in the left lung base. IMPRESSION: Enteric tube tip is been advanced and now in satisfactory apparent position. Electronically Signed   By: Lucienne Capers M.D.   On: 02/18/2020 06:48   DG Abd 1 View  Result Date: 02/17/2020 CLINICAL DATA:  OG tube placement EXAM: ABDOMEN - 1 VIEW COMPARISON:  July 26, 2019 FINDINGS: The enteric tube projects over the gastric body. The visualized bowel gas pattern is nonspecific. IMPRESSION: The enteric tube projects over the gastric body. Electronically Signed   By: Constance Holster M.D.   On: 02/05/2020 17:55   CT HEAD WO CONTRAST  Result Date: 02/16/2020 CLINICAL DATA:  Status post cardiac arrest EXAM: CT HEAD WITHOUT CONTRAST TECHNIQUE: Contiguous axial images were obtained from the base of the skull through the vertex without intravenous contrast. COMPARISON:  March 13, 2012 head CT and brain MRI. FINDINGS: Brain: Ventricles and sulci are normal in size and configuration. There is no intracranial mass, hemorrhage, extra-axial fluid collection, or midline shift. There is preservation of gray-white differentiation without evident cerebral edema. Brain parenchyma appears unremarkable. No acute infarct evident. There is a mild area of deep sulcal asymmetry in the right temporal region compared to the left, stable and a presumed anatomic variant. Vascular: No hyperdense vessel. There is calcification in each carotid siphon region. Skull: The bony calvarium appears intact. Sinuses/Orbits: Visualized paranasal sinuses are clear. Orbits appear symmetric bilaterally. Other: Mastoid air cells are clear. IMPRESSION: Preservation of gray-white differentiation without edema. No acute infarct. No mass or hemorrhage. Foci of arterial vascular  calcification noted. Electronically Signed   By: Lowella Grip III M.D.   On: 02/13/2020 13:03   CT ANGIO CHEST PE W OR WO CONTRAST  Result Date: 02/18/2020 CLINICAL DATA:  Respiratory failure EXAM: CT ANGIOGRAPHY CHEST WITH CONTRAST TECHNIQUE: Multidetector CT imaging of the chest was performed using the standard protocol during bolus administration of intravenous contrast. Multiplanar CT image reconstructions and MIPs were obtained to evaluate the vascular anatomy. CONTRAST:  14m OMNIPAQUE IOHEXOL 350 MG/ML SOLN COMPARISON:  11/19/2019 FINDINGS: Cardiovascular: Moderate coronary artery calcifications, most pronounced in the left anterior descending coronary artery. Aortic calcifications. No aneurysm. Mild cardiomegaly. Mediastinum/Nodes: No mediastinal, hilar, or axillary adenopathy. Endotracheal tube tip is in the midtrachea. Thyroid unremarkable. Lungs/Pleura: Lower lobe volume loss and consolidation bilaterally. This could  reflect atelectasis, pneumonia, or aspiration. No effusions. Upper Abdomen: Subtle nodularity of the liver suggesting cirrhosis as seen on prior study. Prior cholecystectomy. Small amount of perihepatic ascites. Musculoskeletal: Chest wall soft tissues are unremarkable. No acute bony abnormality. Review of the MIP images confirms the above findings. IMPRESSION: No evidence of pulmonary embolus. Cardiomegaly, coronary artery disease. Volume loss and dense consolidation in the lower lobes which could reflect atelectasis, pneumonia or aspiration. Nodular contours of the liver suggest cirrhosis. Small amount of Peri hepatic ascites. Aortic Atherosclerosis (ICD10-I70.0). Electronically Signed   By: Rolm Baptise M.D.   On: 02/18/2020 17:46   MR BRAIN WO CONTRAST  Result Date: 02/17/2020 CLINICAL DATA:  Neuro deficit.  Stroke suspected. EXAM: MRI HEAD WITHOUT CONTRAST TECHNIQUE: Multiplanar, multiecho pulse sequences of the brain and surrounding structures were obtained without  intravenous contrast. COMPARISON:  02/03/2020 head CT and prior.  03/13/2012 MRI head. FINDINGS: Brain: No diffusion-weighted signal abnormality. No intracranial hemorrhage. No midline shift, ventriculomegaly or extra-axial fluid collection. No mass lesion. Remote right basal ganglia insult. Vascular: Grossly preserved major intracranial flow voids. Skull and upper cervical spine: Normal marrow signal. Sinuses/Orbits: Sequela of bilateral lens replacement. Bilateral proptosis, unchanged. Mild to moderate pansinus mucosal thickening. Bilateral mastoid effusions. Other: None. IMPRESSION: No acute intracranial process. Remote right basal ganglia insult. Electronically Signed   By: Primitivo Gauze M.D.   On: 02/17/2020 15:02   DG Chest Port 1 View  Result Date: 02/20/2020 CLINICAL DATA:  Shortness of breath EXAM: PORTABLE CHEST 1 VIEW COMPARISON:  Two days ago FINDINGS: The endotracheal tube tip is at the clavicular heads. The enteric tube at least reaches the diaphragm. Dialysis catheter with tip at the upper cavoatrial junction. Increased hazy opacification of the bilateral lower chest compared to radiograph. By recent chest CT there is bilateral lower lobe collapse. Cardiomegaly. No pneumothorax or clear pulmonary edema. IMPRESSION: 1. Unremarkable hardware positioning. 2. Persistence of bilateral lower lobe collapse seen on recent chest CT. Electronically Signed   By: Monte Fantasia M.D.   On: 02/20/2020 05:22   DG Chest Port 1 View  Result Date: 02/17/2020 CLINICAL DATA:  Respiratory failure EXAM: PORTABLE CHEST 1 VIEW COMPARISON:  02/15/2020 FINDINGS: Unchanged support apparatus. Shallow lung inflation without focal airspace consolidation. Mild cardiomegaly. IMPRESSION: Shallow lung inflation without focal airspace consolidation. Electronically Signed   By: Ulyses Jarred M.D.   On: 02/17/2020 06:21   DG Chest Port 1 View  Result Date: 02/15/2020 CLINICAL DATA:  Respiratory failure. EXAM:  PORTABLE CHEST 1 VIEW COMPARISON:  Chest x-ray 02/19/2020, CT chest 11/19/2019 FINDINGS: Endotracheal tube with tip approximately 8 cm above the carina. Enteric tube courses below diaphragm with tip and side port overlying the expected region the gastric lumen. PO contrast is noted partially opacifying the gastric lumen. Right chest wall dialysis catheter again noted with tip overlying the right atrium with tip near the inferior cavoatrial junction a (tip likely more cranially within the right atrium; however, due to low lung volumes tip is near the junction). The heart size and mediastinal contours are unchanged. Low lung volumes. Vague patchy airspace opacities. Increased interstitial markings. No pleural effusion. No pneumothorax. No acute osseous abnormality. IMPRESSION: 1. Enteric tube with tip terminating 8 cm above the carina. Consider advancing by 2 cm. 2. Low lung volumes with vague patchy airspace opacities that likely represent atelectasis. 3. Cardiomegaly with mild pulmonary edema. Electronically Signed   By: Iven Finn M.D.   On: 02/15/2020 02:28   DG Chest [facility]  1 View  Result Date: 02/21/2020 CLINICAL DATA:  Intubation EXAM: PORTABLE CHEST 1 VIEW COMPARISON:  12/27/2019 FINDINGS: There is a well-positioned tunneled dialysis catheter on the right. The endotracheal tube terminates above the carina and just below the thoracic inlet. The tube terminates approximately 8 cm above the carina. The enteric tube appears to extend below the left hemidiaphragm. The heart size is stable but enlarged. The lung volumes are low. There is vascular congestion with small bilateral pleural effusions and bibasilar atelectasis. There is no acute osseous abnormality. IMPRESSION: 1. Lines and tubes as above. 2. Low lung volumes with bibasilar atelectasis and small bilateral pleural effusions. 3. Cardiomegaly with vascular congestion. Electronically Signed   By: Constance Holster M.D.   On: 02/22/2020 17:54   EEG  adult  Result Date: 02/25/2020 Alexis Goodell, MD     02/07/2020  3:29 PM ELECTROENCEPHALOGRAM REPORT Patient: Patrick Hurst       Room #: IC06A EEG No. ID: [id number] Age: 64 y.o.        Sex: male Requesting Physician: Kasa Report Date:  02/11/2020       Interpreting Physician: Alexis Goodell History: KOSTAS MARROW is an 64 y.o. male with PEA arrest during colonoscopy Medications: Fentanyl, Propofol, Levophed, ASA, Conditions of Recording:  This is a 21 channel routine scalp EEG performed with bipolar and monopolar montages arranged in accordance to the international 10/20 system of electrode placement. One channel was dedicated to EKG recording. The patient is in the intubated and sedated state. Description:  The background activity is very low voltage and often obscured by low voltage artifact and electrocardiogram artifact.   At times some very slow polymorphic delta activity could be identified.  No epileptiform activity is noted.  The patient was stimulated during the recording with no activation of the background rhythm noted.  Hyperventilation and intermittent photic stimulation were not performed. IMPRESSION: This electroencephalogram is characterized by very slow, low voltage background activity that is consistent with a medication effect, but can not rule out a severe diffuse cerebral disturbance as well, etiologically nonspecific.  Clinical correlation recommended.  No epileptiform activity is noted.  Alexis Goodell, MD Neurology 02/03/2020, 2:43 PM   ECHOCARDIOGRAM COMPLETE  Result Date: 02/13/2020    ECHOCARDIOGRAM REPORT   Patient Name:   Patrick Hurst Date of Exam: 02/28/2020 Medical Rec #:  [id number]      Height:       72.0 in Accession #:    [id number]     Weight:       289.2 lb Date of Birth:  20-Nov-1955      BSA:          2.492 m Patient Age:    65 years       BP:           121/46 mmHg Patient Gender: M              HR:           88 bpm. Exam Location:  [facility] Procedure: 2D Echo,  Cardiac Doppler and Color Doppler Indications:     Cardiac Arrest 146.9  History:         Patient has prior history of Echocardiogram examinations, most                  recent 11/16/2019. CHF.  Sonographer:     Sherrie Sport RDCS (AE) Referring Phys:  [id number] [place] Diagnosing Phys: Ida Rogue MD  Sonographer Comments:  Echo performed with patient supine and on artificial respirator. IMPRESSIONS  1. Left ventricular ejection fraction, by estimation, is 50 to 55%. The left ventricle has low normal function. The left ventricle has no regional wall motion abnormalities. There is moderate left ventricular hypertrophy. Indeterminate diastolic filling  due to E-A fusion.  2. The aortic valve was not well visualized. Aortic valve regurgitation is not visualized. Mild to moderate aortic valve sclerosis/calcification is present, without any evidence of aortic stenosis. FINDINGS  Left Ventricle: Left ventricular ejection fraction, by estimation, is 50 to 55%. The left ventricle has low normal function. The left ventricle has no regional wall motion abnormalities. The left ventricular internal cavity size was normal in size. There is moderate left ventricular hypertrophy. Indeterminate diastolic filling due to E-A fusion. Right Ventricle: The right ventricular size is normal. No increase in right ventricular wall thickness. Right ventricular systolic function is normal. Tricuspid regurgitation signal is inadequate for assessing PA pressure. Left Atrium: Left atrial size was normal in size. Right Atrium: Right atrial size was normal in size. Pericardium: There is no evidence of pericardial effusion. Mitral Valve: The mitral valve is normal in structure. Mild to moderate mitral annular calcification. No evidence of mitral valve regurgitation. No evidence of mitral valve stenosis. Tricuspid Valve: The tricuspid valve is normal in structure. Tricuspid valve regurgitation is not demonstrated. No evidence of  tricuspid stenosis. Aortic Valve: The aortic valve was not well visualized. Aortic valve regurgitation is not visualized. Mild to moderate aortic valve sclerosis/calcification is present, without any evidence of aortic stenosis. Aortic valve mean gradient measures 5.5 mmHg.  Aortic valve peak gradient measures 10.2 mmHg. Aortic valve area, by VTI measures 2.12 cm. Pulmonic Valve: The pulmonic valve was normal in structure. Pulmonic valve regurgitation is not visualized. No evidence of pulmonic stenosis. Aorta: The aortic root is normal in size and structure. Venous: The inferior vena cava is normal in size with greater than 50% respiratory variability, suggesting right atrial pressure of 3 mmHg. IAS/Shunts: No atrial level shunt detected by color flow Doppler.  LEFT VENTRICLE PLAX 2D LVIDd:         4.64 cm  Diastology LVIDs:         2.94 cm  LV e' medial:    7.29 cm/s LV PW:         1.74 cm  LV E/e' medial:  12.2 LV IVS:        1.94 cm  LV e' lateral:   4.46 cm/s LVOT diam:     2.30 cm  LV E/e' lateral: 19.9 LV SV:         58 LV SV Index:   23 LVOT Area:     4.15 cm  LEFT ATRIUM             Index       RIGHT ATRIUM           Index LA Vol (A2C):   71.8 ml 28.81 ml/m RA Area:     27.80 cm LA Vol (A4C):   47.2 ml 18.94 ml/m RA Volume:   99.70 ml  40.01 ml/m LA Biplane Vol: 61.2 ml 24.56 ml/m  AORTIC VALVE                    PULMONIC VALVE AV Area (Vmax):    2.03 cm     PV Vmax:        0.93 m/s AV Area (Vmean):   1.97 cm  PV Peak grad:   3.4 mmHg AV Area (VTI):     2.12 cm     RVOT Peak grad: 5 mmHg AV Vmax:           160.00 cm/s AV Vmean:          106.000 cm/s AV VTI:            0.272 m AV Peak Grad:      10.2 mmHg AV Mean Grad:      5.5 mmHg LVOT Vmax:         78.30 cm/s LVOT Vmean:        50.300 cm/s LVOT VTI:          0.139 m LVOT/AV VTI ratio: 0.51  AORTA Ao Root diam: 3.60 cm MITRAL VALVE               TRICUSPID VALVE MV Area (PHT): 2.48 cm    TR Peak grad:   14.4 mmHg MV Decel Time: 306 msec    TR  Vmax:        190.00 cm/s MV E velocity: 88.80 cm/s MV A velocity: 47.50 cm/s  SHUNTS MV E/A ratio:  1.87        Systemic VTI:  0.14 m                            Systemic Diam: 2.30 cm Ida Rogue MD Electronically signed by Ida Rogue MD Signature Date/Time: 02/29/2020/4:14:37 PM    Final    ECHOCARDIOGRAM LIMITED  Result Date: 02/18/2020    ECHOCARDIOGRAM LIMITED REPORT   Patient Name:   Patrick Hurst Date of Exam: 02/18/2020 Medical Rec #:  [id number]      Height:       72.0 in Accession #:    [id number]     Weight:       292.3 lb Date of Birth:  06/08/1955      BSA:          2.504 m Patient Age:    67 years       BP:           Not listed in chart/Not listed in                                              chart mmHg Patient Gender: M              HR:           73 bpm. Exam Location:  [facility] Procedure: Cardiac Doppler, Color Doppler and Limited Echo Indications:     Cardiac Arrest 146.9  History:         Patient has prior history of Echocardiogram examinations, most                  recent 02/12/2020. Risk Factors:Sleep Apnea. Chronic diastolic                  CHF.  Sonographer:     Sherrie Sport RDCS (AE) Referring Phys:  [place] Diagnosing Phys: Bartholome Bill MD IMPRESSIONS  1. Left ventricular ejection fraction, by estimation, is 40 to 45%. The left ventricle has mild to moderately decreased function. The left ventricle demonstrates global hypokinesis. There is moderate left ventricular hypertrophy.  2. Right ventricular systolic function  is normal. The right ventricular size is mildly enlarged.  3. Left atrial size was mildly dilated.  4. The mitral valve was not well visualized. Trivial mitral valve regurgitation.  5. The aortic valve was not well visualized. Aortic valve regurgitation is trivial. FINDINGS  Left Ventricle: Left ventricular ejection fraction, by estimation, is 40 to 45%. The left ventricle has mild to moderately decreased function. The left ventricle demonstrates global  hypokinesis. There is moderate left ventricular hypertrophy. Right Ventricle: The right ventricular size is mildly enlarged. Right vetricular wall thickness was not well visualized. Right ventricular systolic function is normal. Left Atrium: Left atrial size was mildly dilated. Right Atrium: Right atrial size was normal in size. Pericardium: There is no evidence of pericardial effusion. Mitral Valve: The mitral valve was not well visualized. Trivial mitral valve regurgitation. Tricuspid Valve: The tricuspid valve is not well visualized. Tricuspid valve regurgitation is mild. Aortic Valve: The aortic valve was not well visualized. Aortic valve regurgitation is trivial. Pulmonic Valve: The pulmonic valve was not well visualized. Pulmonic valve regurgitation is trivial. IAS/Shunts: The interatrial septum was not assessed. LEFT VENTRICLE PLAX 2D LVIDd:         4.86 cm      Diastology LVIDs:         4.18 cm      LV e' medial:    5.66 cm/s LV PW:         1.81 cm      LV E/e' medial:  12.9 LV IVS:        2.12 cm      LV e' lateral:   11.90 cm/s LVOT diam:     2.30 cm      LV E/e' lateral: 6.1 LVOT Area:     4.15 cm  LV Volumes (MOD) LV vol d, MOD A2C: 162.0 ml LV vol d, MOD A4C: 178.0 ml LV vol s, MOD A2C: 84.5 ml LV vol s, MOD A4C: 111.0 ml LV SV MOD A2C:     77.5 ml LV SV MOD A4C:     178.0 ml LV SV MOD BP:      80.7 ml LEFT ATRIUM             Index       RIGHT ATRIUM           Index LA diam:        4.00 cm 1.60 cm/m  RA Area:     29.00 cm LA Vol (A2C):   75.3 ml 30.08 ml/m RA Volume:   85.70 ml  34.23 ml/m LA Vol (A4C):   96.6 ml 38.59 ml/m LA Biplane Vol: 88.2 ml 35.23 ml/m                        PULMONIC VALVE AORTA                 PV Vmax:        0.59 m/s Ao Root diam: 3.30 cm PV Peak grad:   1.4 mmHg                       RVOT Peak grad: 3 mmHg  MITRAL VALVE               TRICUSPID VALVE MV Area (PHT): 2.52 cm    TR Peak grad:   14.6 mmHg MV Decel Time: 301 msec    TR Vmax:  191.00 cm/s MV E velocity:  72.80 cm/s MV A velocity: 49.00 cm/s  SHUNTS MV E/A ratio:  1.49        Systemic Diam: 2.30 cm Bartholome Bill MD Electronically signed by Bartholome Bill MD Signature Date/Time: 02/18/2020/1:21:04 PM    Final     Assessment & Plan    64 yo male with a significant history of COPD, OSA, T2DM, ESRD, CHF, AF presented to Endoscopy for an outpatient colonoscopy procedure.Duringthe procedure his heart rate began to drop and he PEA arrested requiring 10 minutes of CPR prior to achieving ROSC. He received epinephrine, atropine, calcium, sodium bicarb &D50. He was intubated during the code blue requiring mechanical ventilation.Patient underwent code ice protocol. He currently is intubated and on IV amiodarone.  1. Cardiac arrest-etiology of this is unclear. Appear to be a PEA arrest. Patient's high-sensitivity troponin was mildly elevated but relatively flat. Does not appear to suggest an acute coronary syndrome event. EKG also did not suggest acute coronary syndrome without ST elevation or to suggest this. Patient did not have pericardial effusion. No evidence of pneumothorax. Electrolytes were stable. Patient received prompt ACLS care. Currently intubated and sedated.   2. Respiratory arrest-currently ventilator dependent. Appreciate excellent care by ICU team. Conitnued vent support. Weaning as tolerated.  Chest CT revealed no pulmonary embolus.   3. Abnormal serum troponin-as per above, does not appear to be an acute coronary event. Further assessment of this can be considered once he is recovered from this event. Not a candidate for invasive evaluation at present. Would continue with subcu heparin. Was symptomatically anemic and required transfusion. Would not use antiplatelet therapy or anticoagulation at present until this has been further assessed. Not a candidate for antiplatelet therapy from an ischemic [place].   4. Atrial fibrillation-has had paroxysmal A. fib as an  outpatient. Was on Eliquis and amiodarone. Had stopped the Eliquis and amiodarone prior to his EGD/colonoscopy. Would remain off of Eliquis and other anticoagulation due to concern over GI bleed. Continue with amiodarone 200 mg twice daily via his NG tube. Will use IV boluses should he have breakthrough A. fib that is persistent.A. fib rate appears controlled on current amiodarone regimen.  5. End-stage renal disease-appreciate nephrology input. Will likely require HD for some time. Had been on PD as an outpatient.  6. Anoxic encephalopathy.-Appreciate neurology input. EEG appears to suggest anoxic encephalopathy.    7. CHF-EF 50 to 55% by echo postarrest. ECHO this am revealed EF of 40-45% but still global. No wall motiion abnromalitiesNo effusion.    Signed, Javier Docker Neleh Muldoon MD 02/20/2020, 7:41 AM  Pager: [phone]

## 2020-02-20 NOTE — Progress Notes (Signed)
eeg done °

## 2020-02-20 NOTE — Procedures (Signed)
This am pt was having dialysis. Will do eeg after dialysis

## 2020-02-20 NOTE — Progress Notes (Signed)
[facility]  ROUNDING NOTE   Subjective:   Patient stays intubated,  FiO2 down to 60%  on vasopressin  He is sedated with fentanyl,Versed and Precedex OGT with tube feeds 30 ml/hr Wife at the bedside, reports no seizure like activities today.  Patient received hemodialysis yesterday Phosphorus down to 11.7 today  and hyperkalemia K+ 5.8. Planning another session of hemodialysis today, orders placed.   Objective:  Vital signs in last 24 hours:  Temp:  [97.9 F (36.6 C)-100.3 F (37.9 C)] 99.2 F (37.3 C) (10/20 1030) Pulse Rate:  [32-137] 56 (10/20 1000) Resp:  [15-27] 24 (10/20 1000) BP: (89-195)/(44-77) 140/55 (10/20 1030) SpO2:  [92 %-100 %] 97 % (10/20 1000) Arterial Line BP: (107-176)/(49-75) 130/66 (10/20 1000) FiO2 (%):  [60 %-85 %] 60 % (10/20 0820) Weight:  [133.6 kg] 133.6 kg (10/20 0248)  Weight change: 0.8 kg Filed Weights   02/18/20 0358 02/19/20 0432 02/20/20 0248  Weight: 132.6 kg 132.8 kg 133.6 kg    Intake/Output: I/O last 3 completed shifts: In: 4555.6 [I.V.:2845.9; NG/GT:1333.5; IV Piggyback:376.2] Out: 0    Intake/Output this shift:  Total I/O In: 291.3 [I.V.:135.3; Blood:156] Out: 0   Physical Exam: General:  Critically ill appearing  Head:  Normocephalic, atraumatic, +ETT, +OGT  Eyes:  Sclerae and conjunctivae clear  Lungs:   ventilator assisted,Lungs clear,but diminished at the bases  Heart:  S1S2, no rubs or gallops,Irregular,Afib with HR in low 100's   Abdomen:   Soft,non distended, obese  Extremities:  Trace peripheral edema.  Neurologic:  sedated  Skin: No acute lesions or rashes noted  Access: PD catheter, RIJ permcath, and left radiocephalic AVF +bruit     Basic Metabolic Panel: Recent Labs  Lab 02/16/20 0444 02/16/20 0444 02/17/20 0415 02/17/20 0415 02/18/20 0351 02/18/20 0351 02/18/20 1454 02/18/20 2300 02/19/20 0450 02/20/20 0400  NA 139   < > 136  --  134*  --   --  136 135 135  K 4.6   < > 4.9   <  > 5.6*  --  5.8* 6.0* 5.7* 5.8*  CL 92*   < > 90*  --  88*  --   --  92* 88* 91*  CO2 21*   < > 20*  --  20*  --   --  19* 20* 22  GLUCOSE 157*   < > 178*  --  195*  --   --  305* 305* 242*  BUN 51*   < > 61*  --  68*  --   --  85* 88* 80*  CREATININE 12.34*   < > 13.95*  --  15.33*  --   --  15.29* 15.72* 12.23*  CALCIUM 7.3*   < > 6.7*   < > 6.9*   < >  --  6.7* 6.8* 6.7*  MG 2.3  --  2.0  --  2.3  --   --   --  2.4 2.2  PHOS 10.2*  --  10.0*  --  12.7*  --   --   --  14.1* 11.7*   < > = values in this interval not displayed.    Liver Function Tests: Recent Labs  Lab 02/02/2020 1228 02/15/20 1409 02/18/20 2300  AST 17  --  31  ALT 14  --  15  ALKPHOS 73  --  96  BILITOT 1.0  --  1.1  PROT 7.4  --  6.4*  ALBUMIN 3.2* 2.9* 2.6*  No results for input(s): LIPASE, AMYLASE in the last 168 hours. Recent Labs  Lab 02/19/20 1715  AMMONIA 29    CBC: Recent Labs  Lab 02/16/20 0444 02/17/20 0415 02/18/20 0351 02/19/20 0450 02/20/20 0400  WBC 8.9 6.9 6.9 4.7 4.9  NEUTROABS 7.0 4.9 5.7 4.0 4.0  HGB 9.4* 8.0* 7.9* 7.1* 6.3*  HCT 27.9* 22.0* 23.5* 20.9* 20.4*  MCV 93.0 92.8 93.3 93.3 97.1  PLT 135* 92* 84* 94* 91*    Cardiac Enzymes: Recent Labs  Lab 02/13/2020 1228 02/20/20 0400  CKTOTAL  --  1,869*  CKMB 33.0*  --     BNP: Invalid input(s): POCBNP  CBG: Recent Labs  Lab 02/19/20 1553 02/19/20 1942 02/19/20 2341 02/20/20 0409 02/20/20 0733  GLUCAP 193* 204* 222* 231* 210*    Microbiology: Results for orders placed or performed during the hospital encounter of 02/10/2020  Culture, blood (routine x 2)     Status: None   Collection Time: 02/03/2020 12:29 PM   Specimen: BLOOD  Result Value Ref Range Status   Specimen Description BLOOD RIGHT ANTECUBITAL  Final   Special Requests   Final    BOTTLES DRAWN AEROBIC AND ANAEROBIC Blood Culture adequate volume   Culture   Final    NO GROWTH 5 DAYS Performed at [facility], [address]  [place], [place] [address]    Report Status 02/19/2020 FINAL  Final  Culture, blood (routine x 2)     Status: None   Collection Time: 02/12/2020 12:30 PM   Specimen: BLOOD  Result Value Ref Range Status   Specimen Description BLOOD BLOOD RIGHT HAND  Final   Special Requests   Final    BOTTLES DRAWN AEROBIC AND ANAEROBIC Blood Culture adequate volume   Culture   Final    NO GROWTH 5 DAYS Performed at [facility], [address] [place], [place] [address]    Report Status 02/19/2020 FINAL  Final  MRSA PCR Screening     Status: None   Collection Time: 02/13/2020 12:48 PM   Specimen: Nasal Mucosa; Nasopharyngeal  Result Value Ref Range Status   MRSA by PCR NEGATIVE NEGATIVE Final    Comment:        The GeneXpert MRSA Assay (FDA approved for NASAL specimens only), is one component of a comprehensive MRSA colonization surveillance program. It is not intended to diagnose MRSA infection nor to guide or monitor treatment for MRSA infections. Performed at [facility], [address] [place], [place] [address]   Culture, respiratory (non-expectorated)     Status: None (Preliminary result)   Collection Time: 02/18/20  2:11 PM   Specimen: Tracheal Aspirate; Respiratory  Result Value Ref Range Status   Specimen Description   Final    TRACHEAL ASPIRATE Performed at [facility], [address] [place], [place] [address]    Special Requests   Final    NONE Performed at [facility], [address] [place], [place] [address]    Gram Stain   Final    ABUNDANT WBC PRESENT, PREDOMINANTLY PMN ABUNDANT GRAM POSITIVE COCCI FEW YEAST    Culture   Final    TOO YOUNG TO READ Performed at [facility], [address] [place], [place] [address]    Report Status PENDING  Incomplete    Coagulation Studies: No results for input(s): LABPROT, INR in the last 72 hours.  Urinalysis: No results for input(s): COLORURINE, LABSPEC, [place], [place],  [place], BILIRUBINUR, KETONESUR,  PROTEINUR, UROBILINOGEN, NITRITE, LEUKOCYTESUR in the last 72 hours.  Invalid input(s): APPERANCEUR    Imaging: EEG  Result Date: 02/19/2020 Lora Havens, MD     02/19/2020  4:53 PM Patient Name: Patrick Hurst MRN: [id number] Epilepsy Attending: Lora Havens Referring Physician/Provider: Dr Flora Lipps Date: 02/19/2020 Duration: 24.51 mins Patient history: 64 year old male s/p cardiac arrest x 10 minutes, now unresponsive on the ventilator with depressed brainstem reflexes. He is s/p TTM. EEG to evaluate for seizure Level of alertness:  comatose AEDs during EEG study: None Technical aspects: This EEG study was done with scalp electrodes positioned according to the 10-20 International system of electrode placement. Electrical activity was acquired at a sampling rate of '500Hz'  and reviewed with a high frequency filter of '70Hz'  and a low frequency filter of '1Hz' . EEG data were recorded continuously and digitally stored. Description: EEG showed generalized periodic epileptiform discharges with overriding fast activity at 1-1.'5hz'  as well as generalized background suppression. Two seizures were noted at 1455 and 1504, lasting 45 seconds and 1.5 mins respectively. On video patient was noted to have sudden eye opening, upward gaze deviation and rhythmic arm twitching. Concomitant eeg showed generalized polyspikes.  Hyperventilation and photic stimulation were not performed.   ABNORMALITY - Myoclonic Seizure, generalized - Periodic epileptiform discharges with plus pattern, generalized - Background suppression, generalized IMPRESSION: This study showed two myoclonic seizures as described above with generalized onset, at 1455 and 1504, lasting 45 seconds and 1.5 mins respectively. 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 Dr Cheral Marker was notified. Priyanka Barbra Sarks   CT ANGIO CHEST PE W OR WO  CONTRAST  Result Date: 02/18/2020 CLINICAL DATA:  Respiratory failure EXAM: CT ANGIOGRAPHY CHEST WITH CONTRAST TECHNIQUE: Multidetector CT imaging of the chest was performed using the standard protocol during bolus administration of intravenous contrast. Multiplanar CT image reconstructions and MIPs were obtained to evaluate the vascular anatomy. CONTRAST:  35m OMNIPAQUE IOHEXOL 350 MG/ML SOLN COMPARISON:  11/19/2019 FINDINGS: Cardiovascular: Moderate coronary artery calcifications, most pronounced in the left anterior descending coronary artery. Aortic calcifications. No aneurysm. Mild cardiomegaly. Mediastinum/Nodes: No mediastinal, hilar, or axillary adenopathy. Endotracheal tube tip is in the midtrachea. Thyroid unremarkable. Lungs/Pleura: Lower lobe volume loss and consolidation bilaterally. This could reflect atelectasis, pneumonia, or aspiration. No effusions. Upper Abdomen: Subtle nodularity of the liver suggesting cirrhosis as seen on prior study. Prior cholecystectomy. Small amount of perihepatic ascites. Musculoskeletal: Chest wall soft tissues are unremarkable. No acute bony abnormality. Review of the MIP images confirms the above findings. IMPRESSION: No evidence of pulmonary embolus. Cardiomegaly, coronary artery disease. Volume loss and dense consolidation in the lower lobes which could reflect atelectasis, pneumonia or aspiration. Nodular contours of the liver suggest cirrhosis. Small amount of Peri hepatic ascites. Aortic Atherosclerosis (ICD10-I70.0). Electronically Signed   By: KRolm BaptiseM.D.   On: 02/18/2020 17:46   DG Chest Port 1 View  Result Date: 02/20/2020 CLINICAL DATA:  Shortness of breath EXAM: PORTABLE CHEST 1 VIEW COMPARISON:  Two days ago FINDINGS: The endotracheal tube tip is at the clavicular heads. The enteric tube at least reaches the diaphragm. Dialysis catheter with tip at the upper cavoatrial junction. Increased hazy opacification of the bilateral lower chest  compared to radiograph. By recent chest CT there is bilateral lower lobe collapse. Cardiomegaly. No pneumothorax or clear pulmonary edema. IMPRESSION: 1. Unremarkable hardware positioning. 2. Persistence of bilateral lower lobe collapse seen on recent chest CT.  Electronically Signed   By: Monte Fantasia M.D.   On: 02/20/2020 05:22     Medications:   . sodium chloride    . sodium chloride 10 mL/hr at 02/15/20 0600  . ampicillin-sulbactam (UNASYN) IV Stopped (02/19/20 1921)  . dexmedetomidine (PRECEDEX) IV infusion 1 mcg/kg/hr (02/20/20 0933)  . dialysis solution 1.5% low-MG/low-CA    . dialysis solution 2.5% low-MG/low-CA    . famotidine (PEPCID) IV Stopped (02/19/20 1720)  . fentaNYL infusion INTRAVENOUS 250 mcg/hr (02/20/20 0800)  . midazolam 5 mg/hr (02/20/20 0800)  . norepinephrine (LEVOPHED) Adult infusion Stopped (02/18/20 1527)  . vasopressin 0.01 Units/min (02/20/20 0800)   . sodium chloride   Intravenous Once  . amiodarone  200 mg Per Tube BID  . chlorhexidine gluconate (MEDLINE KIT)  15 mL Mouth Rinse BID  . Chlorhexidine Gluconate Cloth  6 each Topical Daily  . docusate  100 mg Per Tube BID  . feeding supplement (NEPRO CARB STEADY)  1,000 mL Per Tube Q24H  . feeding supplement (PROSource TF)  90 mL Per Tube 5 X Daily  . fentaNYL (SUBLIMAZE) injection  100 mcg Intravenous Once  . gentamicin cream  1 application Topical Daily  . heparin  5,000 Units Subcutaneous Q8H  . insulin aspart  0-6 Units Subcutaneous Q4H  . ipratropium-albuterol  3 mL Nebulization Q4H  . mouth rinse  15 mL Mouth Rinse 10 times per day  . methylPREDNISolone (SOLU-MEDROL) injection  20 mg Intravenous Q12H  . multivitamin  1 tablet Per Tube QHS  . polyethylene glycol  17 g Per Tube Daily  . valproic acid  5 mg/kg Per Tube TID   docusate sodium, fentaNYL, hydrALAZINE, midazolam, ondansetron (ZOFRAN) IV, polyethylene glycol, vecuronium  Assessment/ Plan:  Patrick Hurst is a 64 y.o. white male  with end stage renal disease on peritoneal dialysis, hepatic cirrhosis, congestive heart failure, COPD, atrial fibrillation, depression, GERD, sleep apnea, and gout who was admitted after cardiac arrest prior to scheduled colonoscopy on 02/28/2020. Patient currently admitted to [facility] ICU requiring intubation, mechanical ventilation and vasopressors.   # ESRD on Peritoneal dialysis:  Patient received hemodialysis yesterday  -Will plan for another  session of hemodialysis today -Orders placed -Will continue to assess daily for dialysis need  #Hyperkalemia Potassium 5.8 today Will use 1+K bath for dialysis  #Hypotension with cardiogenic shock - holding metoprolol and torsemide -stays Normotensive, on vasopressin  #Anemia with renal failure and GI bleed.  Received PRBC transfusion on 02/04/2020 Lab Results  Component Value Date   HGB 6.3 (L) 02/20/2020    Hematology and Gastro enterology teams involved in the care -Receives Epogen 40,000 unit subcutaneous weekly as outpatient, last dose was on 02/11/2020  -Patient receiving PRBC transfusion   #Secondary Hyperparathyroidism with hyperphosphatemia.  PTH Lab Results  Component Value Date   PTH 222 (H) 12/27/2019   CALCIUM 6.7 (L) 02/20/2020   CAION 0.97 (L) 11/08/2019   PHOS 11.7 (H) 02/20/2020    Hyperphosphatemia, improving  Hemodialysis again today  # Acute respiratory failure Ventilator Dependent, FiO2 weaned down to 60% today Suspicion of aspiration pneumonia. Patient is getting treated with unasyn CT angiogram on 02/18/2020,No Pulmonary Embolism    LOS: 6 Dandrea Widdowson 10/20/202110:42 AM

## 2020-02-20 NOTE — Progress Notes (Signed)
Inpatient Diabetes Program Recommendations  AACE/ADA: New Consensus Statement on Inpatient Glycemic Control   Target Ranges:  Prepandial:   less than 140 mg/dL      Peak postprandial:   less than 180 mg/dL (1-2 hours)      Critically ill patients:  140 - 180 mg/dL   Results for Patrick Hurst, Patrick Hurst (MRN [id number]) as of 02/20/2020 13:21  Ref. Range 02/19/2020 07:09 02/19/2020 11:04 02/19/2020 15:53 02/19/2020 19:42 02/19/2020 23:41 02/20/2020 04:09 02/20/2020 07:33 02/20/2020 11:18  Glucose-Capillary Latest Ref Range: 70 - 99 mg/dL 231 (H) 230 (H) 193 (H) 204 (H) 222 (H) 231 (H) 210 (H) 182 (H)   Review of Glycemic Control  Diabetes history: DM2 Outpatient Diabetes medications: Humulin R U500 60 units BID Current orders for Inpatient glycemic control: Novolog 0-6 units Q4H; Solumedrol 20 mg Q12H, Nepro @ 30 ml/hr  Inpatient Diabetes Program Recommendations:    Insulin: Please consider ordering Novolog 3 units Q4H for tube feeding coverage. If tube feeding is held or stopped then Novolog tube feeding coverage should also be held or stopped   Thanks, Barnie Alderman, RN, MSN, CDE Diabetes Coordinator Inpatient Diabetes Program [phone] (Team Pager from 8am to 5pm)

## 2020-02-20 NOTE — Progress Notes (Signed)
CRITICAL CARE NOTE 64 yo male presented for outpatient colonoscopy PEA cardiac arrested during the procedure, s/p 10 minutes CPR, intubated requiring mechanical ventilation with Targeted Temperature Management admitted to the ICU.  10/14Code Blue in Endo procedure, intubated, admitted to ICU for TTM 02/15/20- patient evaluated at bedside. Spoke to wife at length and reviewed various testing and diagnostics as well as therapy received and short term care plan. Levophed at 86mg/kg/min. S/p PD therapy this am. Patient with atelectasis on CXR this am, weaning FiO2 on MV with plan for SBT in am. Discussed care plan with renal team - Dr KJuleen Chinaconsidering HD.  02/16/20-patient did not pass breathing trial today, had family meeting with son and wife at bedside with RN present throughout. Plan to re-attempt SBT in AM.  02/17/20- patient had increased O2 requirement overnight and is more edematous with anasarca on exam this morning. I met with family and explained patient with numerous serious comorbid conditions and overall very poor prognosis long term. He was seen by cardiology and nephrology today. Awakening trial with no appreciable improvement in GCS, today plan to repeat SBT if FiO2 can be weaned to <40% and perform MRI brain w/wo to evaluate for ischemic changes post PEA arrest. 10/18 signs of anoxic brain injury 10/19 +seizures  CC  follow up respiratory failure  SUBJECTIVE Patient remains critically ill Prognosis is guarded Multiorgan failure Very poor prognosis   BP 140/77   Pulse (!) 104   Temp 99 F (37.2 C) (Oral)   Resp (!) 24   Ht 6' (1.829 m)   Wt 133.6 kg   SpO2 97%   BMI 39.95 kg/m    I/O last 3 completed shifts: In: 4555.6 [I.V.:2845.9; NG/GT:1333.5; IV Piggyback:376.2] Out: 0  No intake/output data recorded.  SpO2: 97 % FiO2 (%): 80 %  Estimated body mass index is 39.95 kg/m as calculated from the following:   Height as of this encounter: 6' (1.829 m).    Weight as of this encounter: 133.6 kg.  SIGNIFICANT EVENTS   REVIEW OF SYSTEMS  PATIENT IS UNABLE TO PROVIDE COMPLETE REVIEW OF SYSTEMS DUE TO SEVERE CRITICAL ILLNESS   Pressure Injury 02/19/20 Buttocks Right Stage 2 -  Partial thickness loss of dermis presenting as a shallow open injury with a red, pink wound bed without slough. (Active)  02/19/20 2000  Location: Buttocks  Location Orientation: Right  Staging: Stage 2 -  Partial thickness loss of dermis presenting as a shallow open injury with a red, pink wound bed without slough.  Wound Description (Comments):   Present on Admission: No      PHYSICAL EXAMINATION:  GENERAL:critically ill appearing, +resp distress HEAD: Normocephalic, atraumatic.  EYES: Pupils equal, round, reactive to light.  No scleral icterus.  MOUTH: Moist mucosal membrane. NECK: Supple.  PULMONARY: +rhonchi, +wheezing CARDIOVASCULAR: S1 and S2. Regular rate and rhythm. No murmurs, rubs, or gallops.  GASTROINTESTINAL: Soft, nontender, -distended.  Positive bowel sounds.   MUSCULOSKELETAL: No swelling, clubbing, or edema.  NEUROLOGIC: obtunded, GCS<8 SKIN:intact,warm,dry  MEDICATIONS: I have reviewed all medications and confirmed regimen as documented   CULTURE RESULTS   Recent Results (from the past 240 hour(s))  SARS CORONAVIRUS 2 (TAT 6-24 HRS) Nasopharyngeal Nasopharyngeal Swab     Status: None   Collection Time: 02/12/20 11:16 AM   Specimen: Nasopharyngeal Swab  Result Value Ref Range Status   SARS Coronavirus 2 NEGATIVE NEGATIVE Final    Comment: (NOTE) SARS-CoV-2 target nucleic acids are NOT DETECTED.  The SARS-CoV-2 RNA  is generally detectable in upper and lower respiratory specimens during the acute phase of infection. Negative results do not preclude SARS-CoV-2 infection, do not rule out co-infections with other pathogens, and should not be used as the sole basis for treatment or other patient management decisions. Negative results  must be combined with clinical observations, patient history, and epidemiological information. The expected result is Negative.  Fact Sheet for Patients: SugarRoll.be  Fact Sheet for Healthcare Providers: https://www.woods-mathews.com/  This test is not yet approved or cleared by the [place] FDA and  has been authorized for detection and/or diagnosis of SARS-CoV-2 by FDA under an Emergency Use Authorization (EUA). This EUA will remain  in effect (meaning this test can be used) for the duration of the COVID-19 declaration under Se ction 564(b)(1) of the Act, 21 U.S.C. section 360bbb-3(b)(1), unless the authorization is terminated or revoked sooner.  Performed at [facility], [place] [address], [place], [place] [address]   Culture, blood (routine x 2)     Status: None   Collection Time: 02/04/2020 12:29 PM   Specimen: BLOOD  Result Value Ref Range Status   Specimen Description BLOOD RIGHT ANTECUBITAL  Final   Special Requests   Final    BOTTLES DRAWN AEROBIC AND ANAEROBIC Blood Culture adequate volume   Culture   Final    NO GROWTH 5 DAYS Performed at [facility], [address] [place], [place] [address]    Report Status 02/19/2020 FINAL  Final  Culture, blood (routine x 2)     Status: None   Collection Time: 02/22/2020 12:30 PM   Specimen: BLOOD  Result Value Ref Range Status   Specimen Description BLOOD BLOOD RIGHT HAND  Final   Special Requests   Final    BOTTLES DRAWN AEROBIC AND ANAEROBIC Blood Culture adequate volume   Culture   Final    NO GROWTH 5 DAYS Performed at [facility], [address] [place], [place] [address]    Report Status 02/19/2020 FINAL  Final  MRSA PCR Screening     Status: None   Collection Time: 02/15/2020 12:48 PM   Specimen: Nasal Mucosa; Nasopharyngeal  Result Value Ref Range Status   MRSA by PCR NEGATIVE NEGATIVE Final    Comment:        The GeneXpert MRSA Assay  (FDA approved for NASAL specimens only), is one component of a comprehensive MRSA colonization surveillance program. It is not intended to diagnose MRSA infection nor to guide or monitor treatment for MRSA infections. Performed at [facility], [address] [place], [place] [address]   Culture, respiratory (non-expectorated)     Status: None (Preliminary result)   Collection Time: 02/18/20  2:11 PM   Specimen: Tracheal Aspirate; Respiratory  Result Value Ref Range Status   Specimen Description   Final    TRACHEAL ASPIRATE Performed at [facility] [address] [place], [place] [address]    Special Requests   Final    NONE Performed at [facility], [address] [place], [place] [address]    Gram Stain   Final    ABUNDANT WBC PRESENT, PREDOMINANTLY PMN ABUNDANT GRAM POSITIVE COCCI FEW YEAST    Culture   Final    TOO YOUNG TO READ Performed at [facility], [place] [address], [place],  [address]    Report Status PENDING  Incomplete          IMAGING    EEG  Result  Date: 02/19/2020 Lora Havens, MD     02/19/2020  4:53 PM Patient Name: Patrick Hurst MRN: [id number] Epilepsy Attending: Lora Havens Referring Physician/Provider: Dr Flora Lipps Date: 02/19/2020 Duration: 24.51 mins Patient history: 64 year old male s/p cardiac arrest x 10 minutes, now unresponsive on the ventilator with depressed brainstem reflexes. He is s/p TTM. EEG to evaluate for seizure Level of alertness:  comatose AEDs during EEG study: None Technical aspects: This EEG study was done with scalp electrodes positioned according to the 10-20 International system of electrode placement. Electrical activity was acquired at a sampling rate of '500Hz'  and reviewed with a high frequency filter of '70Hz'  and a low frequency filter of '1Hz' . EEG data were recorded continuously and digitally stored. Description: EEG showed generalized periodic epileptiform  discharges with overriding fast activity at 1-1.'5hz'  as well as generalized background suppression. Two seizures were noted at 1455 and 1504, lasting 45 seconds and 1.5 mins respectively. On video patient was noted to have sudden eye opening, upward gaze deviation and rhythmic arm twitching. Concomitant eeg showed generalized polyspikes.  Hyperventilation and photic stimulation were not performed.   ABNORMALITY - Myoclonic Seizure, generalized - Periodic epileptiform discharges with plus pattern, generalized - Background suppression, generalized IMPRESSION: This study showed two myoclonic seizures as described above with generalized onset, at 1455 and 1504, lasting 45 seconds and 1.5 mins respectively. 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 Dr Cheral Marker was notified. Lora Havens   DG Chest Port 1 View  Result Date: 02/20/2020 CLINICAL DATA:  Shortness of breath EXAM: PORTABLE CHEST 1 VIEW COMPARISON:  Two days ago FINDINGS: The endotracheal tube tip is at the clavicular heads. The enteric tube at least reaches the diaphragm. Dialysis catheter with tip at the upper cavoatrial junction. Increased hazy opacification of the bilateral lower chest compared to radiograph. By recent chest CT there is bilateral lower lobe collapse. Cardiomegaly. No pneumothorax or clear pulmonary edema. IMPRESSION: 1. Unremarkable hardware positioning. 2. Persistence of bilateral lower lobe collapse seen on recent chest CT. Electronically Signed   By: Monte Fantasia M.D.   On: 02/20/2020 05:22     Nutrition Status: Nutrition Problem: Inadequate oral intake Etiology: inability to eat (pt sedated and ventilated) Signs/Symptoms: NPO status       Indwelling Urinary Catheter continued, requirement due to   Reason to continue Indwelling Urinary Catheter strict Intake/Output monitoring for hemodynamic instability   Central Line/ continued,  requirement due to  Reason to continue [place] of central venous pressure or other hemodynamic parameters and poor IV access   Ventilator continued, requirement due to severe respiratory failure   Ventilator Sedation RASS 0 to -2      ASSESSMENT AND PLAN SYNOPSIS  ACUTE SEVERE CARDIAC ARREST DUE TO ACUTE ISCHEMIC CARDIOMYOPATHY Acute and severe hypoxic resp failure, complicated by aspiration pneumonia withFINDINGS STILLCONCERNING FOR ANOXIC BRAIN INJURY WITH MULTIORGAN FAILURE, +SEZIURES  Severe ACUTE Hypoxic and Hypercapnic Respiratory Failure -continue Full MV support -continue Bronchodilator Therapy -Wean Fio2 and PEEP as tolerated -VAP/VENT bundle implementation  ACUTE SYSTOLIC CARDIAC FAILURE- EF 45% -oxygen as needed -follow up cardiac enzymes as indicated -follow up cardiology recs   Morbid obesity, possible OSA.   Will certainly impact respiratory mechanics, ventilator weaning Suspect will need to consider additional PEEP   ESRD Renal Failure -continue Foley Catheter-assess need -Avoid nephrotoxic agents -Follow urine output, BMP -Ensure adequate renal perfusion, optimize oxygenation -Renal dose medications PD/HD  as per nephrology     NEUROLOGY Acute toxic metabolic encephalopathy, need for sedation Goal RASS -2 to -3 +seziures Follow up [place] ICU monitoring  ID Dx of aspiration pneumonia -continue IV abx as prescibed -follow up cultures  GI GI PROPHYLAXIS as indicated   DIET-->TF's as tolerated Constipation protocol as indicated  ENDO - will use ICU hypoglycemic\Hyperglycemia protocol if indicated     ELECTROLYTES -follow labs as needed -replace as needed -pharmacy consultation and following   DVT/GI PRX ordered and assessed TRANSFUSIONS AS NEEDED MONITOR FSBS I Assessed the need for Labs I Assessed the need for Foley I Assessed the need for Central Venous Line Family Discussion when  available I Assessed the need for Mobilization I made an Assessment of medications to be adjusted accordingly Safety Risk assessment completed   CASE DISCUSSED IN MULTIDISCIPLINARY ROUNDS WITH ICU TEAM  Critical Care Time devoted to patient care services described in this note is 75 minutes.   Overall, patient is critically ill, prognosis is guarded.  Patient with Multiorgan failure and at high risk for cardiac arrest and death.    Corrin Parker, M.D.  [facility]  Medical Director [place] Director [facility] Cardio-Pulmonary Department

## 2020-02-20 NOTE — Procedures (Addendum)
Patient Name: Patrick Hurst  MRN: [id number]  Epilepsy Attending: Lora Havens  Referring Physician/Provider: Dr Kerney Elbe Date: 02/20/2020 Duration: 29.44 mins  Patient history: 64 year old male s/p cardiac arrest x 10 minutes, now unresponsive on the ventilator with depressed brainstem reflexes. He is s/p TTM. EEG to evaluate for seizure  Level of alertness:  comatose  AEDs during EEG study:  Versed, valproic acid  Technical aspects: This EEG study was done with scalp electrodes positioned according to the 10-20 International system of electrode placement. Electrical activity was acquired at a sampling rate of 500Hz  and reviewed with a high frequency filter of 70Hz  and a low frequency filter of 1Hz . EEG data were recorded continuously and digitally stored.   Description: EEG showed generalized periodic epileptiform discharges with overriding fast activity at 1-2Hz  as well as continuous generalized 3 to 5 Hz theta-delta slowing. Hyperventilation and photic stimulation were not performed.     ABNORMALITY - Periodic epileptiform discharges with plus pattern, generalized  - Continuous slow, generalized   IMPRESSION: This study  showed evidence of generalized epileptogenic city as well as severe diffuse encephalopathy, nonspecific etiology but likely secondary to anoxic/hypoxic brain injury.  No seizures were seen during the study.  EEG appears improved compared to previous day.    Dr Cheral Marker was notified.  Kennis Buell Barbra Sarks

## 2020-02-20 NOTE — Progress Notes (Signed)
Subjective: On IV sedation with fentanyl, Precedex and Versed.   Objective: Current vital signs: BP 123/66   Pulse (!) 42   Temp 99.8 F (37.7 C)   Resp (!) 24   Ht 6' (1.829 m)   Wt 133.6 kg   SpO2 95%   BMI 39.95 kg/m  Vital signs in last 24 hours: Temp:  [97.9 F (36.6 C)-100.3 F (37.9 C)] 99.8 F (37.7 C) (10/20 1245) Pulse Rate:  [32-137] 42 (10/20 1430) Resp:  [21-27] 24 (10/20 1430) BP: (123-144)/(55-75) 123/66 (10/20 1315) SpO2:  [92 %-100 %] 95 % (10/20 1430) Arterial Line BP: (109-149)/(52-76) 109/57 (10/20 1430) FiO2 (%):  [40 %-85 %] 40 % (10/20 1626) Weight:  [133.6 kg] 133.6 kg (10/20 0248)  Intake/Output from previous day: 10/19 0701 - 10/20 0700 In: 2606.7 [I.V.:1511.9; NG/GT:868.5; IV Piggyback:226.2] Out: 0  Intake/Output this shift: Total I/O In: 1084.4 [I.V.:398.4; Blood:586; Other:100] Out: 1300 [Other:1300] Nutritional status:  Diet Order            Diet NPO time specified  Diet effective now                 Physical Exam  General: Morbidly obese HEENT-  [place]/AT .   Lungs- Intubated Extremities- Chronic pigmentary changes to knees and distal BLE noted.   Neurological Examination Mental Status: Comatose versus sedated. No responses to any external stimuli.  Cranial Nerves: II: Pupils pinpoint and unreactive. No blink to threat.   III,IV, VI: No doll's eye reflex present.  V,VII: No reaction to eyelid stimulation.  VIII: No response to voice or loud clap (sedated) IX,X: Intubated XI: Unable to assess XII: Intubated Motor/Sensory: Flaccid tone x 4.  No posturing or myoclonus noted.  No movement to any stimuli.  Deep Tendon Reflexes: 0 bilateral brachioradialis, biceps and patellae. Toes mute bilaterally.  Cerebellar/Gait: Unable to assess.  Lab Results: Results for orders placed or performed during the hospital encounter of 02/04/2020 (from the past 48 hour(s))  Glucose, capillary     Status: Abnormal   Collection Time: 02/18/20   7:32 PM  Result Value Ref Range   Glucose-Capillary 228 (H) 70 - 99 mg/dL    Comment: Glucose reference range applies only to samples taken after fasting for at least 8 hours.  Comprehensive metabolic panel     Status: Abnormal   Collection Time: 02/18/20 11:00 PM  Result Value Ref Range   Sodium 136 135 - 145 mmol/L    Comment: LYTES REPEATED SKL   Potassium 6.0 (H) 3.5 - 5.1 mmol/L   Chloride 92 (L) 98 - 111 mmol/L   CO2 19 (L) 22 - 32 mmol/L   Glucose, Bld 305 (H) 70 - 99 mg/dL    Comment: Glucose reference range applies only to samples taken after fasting for at least 8 hours.   BUN 85 (H) 8 - 23 mg/dL   Creatinine, Ser 15.29 (H) 0.61 - 1.24 mg/dL   Calcium 6.7 (L) 8.9 - 10.3 mg/dL   Total Protein 6.4 (L) 6.5 - 8.1 g/dL   Albumin 2.6 (L) 3.5 - 5.0 g/dL   AST 31 15 - 41 U/L   ALT 15 0 - 44 U/L   Alkaline Phosphatase 96 38 - 126 U/L   Total Bilirubin 1.1 0.3 - 1.2 mg/dL   GFR, Estimated 3 (L) >60 mL/min   Anion gap 25 (H) 5 - 15    Comment: Performed at [facility], [address], [place], [place] [address]  Glucose,  capillary     Status: Abnormal   Collection Time: 02/18/20 11:40 PM  Result Value Ref Range   Glucose-Capillary 256 (H) 70 - 99 mg/dL    Comment: Glucose reference range applies only to samples taken after fasting for at least 8 hours.  Glucose, capillary     Status: Abnormal   Collection Time: 02/19/20  3:17 AM  Result Value Ref Range   Glucose-Capillary 294 (H) 70 - 99 mg/dL    Comment: Glucose reference range applies only to samples taken after fasting for at least 8 hours.  CBC with Differential/Platelet     Status: Abnormal   Collection Time: 02/19/20  4:50 AM  Result Value Ref Range   WBC 4.7 4.0 - 10.5 K/uL   RBC 2.24 (L) 4.22 - 5.81 MIL/uL   Hemoglobin 7.1 (L) 13.0 - 17.0 g/dL   HCT 20.9 (L) 39 - 52 %   MCV 93.3 80.0 - 100.0 fL   MCH 31.7 26.0 - 34.0 pg   MCHC 34.0 30.0 - 36.0 g/dL   RDW 16.6 (H) 11.5 - 15.5 %   Platelets 94 (L) 150 -  400 K/uL    Comment: Immature Platelet Fraction may be clinically indicated, consider ordering this additional test [id number]    nRBC 0.9 (H) 0.0 - 0.2 %   Neutrophils Relative % 85 %   Neutro Abs 4.0 1.7 - 7.7 K/uL   Lymphocytes Relative 5 %   Lymphs Abs 0.2 (L) 0.7 - 4.0 K/uL   Monocytes Relative 8 %   Monocytes Absolute 0.4 0.1 - 1.0 K/uL   Eosinophils Relative 0 %   Eosinophils Absolute 0.0 0.0 - 0.5 K/uL   Basophils Relative 0 %   Basophils Absolute 0.0 0.0 - 0.1 K/uL   Immature Granulocytes 2 %   Abs Immature Granulocytes 0.10 (H) 0.00 - 0.07 K/uL    Comment: Performed at [facility], [address] [place], [place] [address]  Magnesium     Status: None   Collection Time: 02/19/20  4:50 AM  Result Value Ref Range   Magnesium 2.4 1.7 - 2.4 mg/dL    Comment: Performed at [facility], [address] [place], [place] [address]  Phosphorus     Status: Abnormal   Collection Time: 02/19/20  4:50 AM  Result Value Ref Range   Phosphorus 14.1 (H) 2.5 - 4.6 mg/dL    Comment: RESULT CONFIRMED BY MANUAL DILUTION HNM Performed at [facility], [address] [place], [place] [address]   Basic metabolic panel     Status: Abnormal   Collection Time: 02/19/20  4:50 AM  Result Value Ref Range   Sodium 135 135 - 145 mmol/L   Potassium 5.7 (H) 3.5 - 5.1 mmol/L   Chloride 88 (L) 98 - 111 mmol/L   CO2 20 (L) 22 - 32 mmol/L   Glucose, Bld 305 (H) 70 - 99 mg/dL    Comment: Glucose reference range applies only to samples taken after fasting for at least 8 hours.   BUN 88 (H) 8 - 23 mg/dL   Creatinine, Ser 15.72 (H) 0.61 - 1.24 mg/dL   Calcium 6.8 (L) 8.9 - 10.3 mg/dL   GFR, Estimated 3 (L) >60 mL/min   Anion gap 27 (H) 5 - 15    Comment: Performed at [facility], [address] [place], [place] [address]  Triglycerides     Status: Abnormal   Collection Time: 02/19/20  4:50 AM  Result Value  Ref Range   Triglycerides 664 (H) <150 mg/dL     Comment: Performed at [facility], [address] [place], [place] [address]  Glucose, capillary     Status: Abnormal   Collection Time: 02/19/20  7:09 AM  Result Value Ref Range   Glucose-Capillary 231 (H) 70 - 99 mg/dL    Comment: Glucose reference range applies only to samples taken after fasting for at least 8 hours.  Glucose, capillary     Status: Abnormal   Collection Time: 02/19/20 11:04 AM  Result Value Ref Range   Glucose-Capillary 230 (H) 70 - 99 mg/dL    Comment: Glucose reference range applies only to samples taken after fasting for at least 8 hours.  Glucose, capillary     Status: Abnormal   Collection Time: 02/19/20  3:53 PM  Result Value Ref Range   Glucose-Capillary 193 (H) 70 - 99 mg/dL    Comment: Glucose reference range applies only to samples taken after fasting for at least 8 hours.  Ammonia     Status: None   Collection Time: 02/19/20  5:15 PM  Result Value Ref Range   Ammonia 29 9 - 35 umol/L    Comment: Performed at [facility], [address] [place], [place] [address]  Glucose, capillary     Status: Abnormal   Collection Time: 02/19/20  7:42 PM  Result Value Ref Range   Glucose-Capillary 204 (H) 70 - 99 mg/dL    Comment: Glucose reference range applies only to samples taken after fasting for at least 8 hours.  Glucose, capillary     Status: Abnormal   Collection Time: 02/19/20 11:41 PM  Result Value Ref Range   Glucose-Capillary 222 (H) 70 - 99 mg/dL    Comment: Glucose reference range applies only to samples taken after fasting for at least 8 hours.  CBC with Differential/Platelet     Status: Abnormal   Collection Time: 02/20/20  4:00 AM  Result Value Ref Range   WBC 4.9 4.0 - 10.5 K/uL   RBC 2.10 (L) 4.22 - 5.81 MIL/uL   Hemoglobin 6.3 (L) 13.0 - 17.0 g/dL   HCT 20.4 (L) 39 - 52 %   MCV 97.1 80.0 - 100.0 fL   MCH 30.0 26.0 - 34.0 pg   MCHC 30.9 30.0 - 36.0 g/dL   RDW 16.9 (H) 11.5 - 15.5 %   Platelets 91 (L) 150 - 400 K/uL     Comment: Immature Platelet Fraction may be clinically indicated, consider ordering this additional test [id number]    nRBC 1.0 (H) 0.0 - 0.2 %   Neutrophils Relative % 81 %   Neutro Abs 4.0 1.7 - 7.7 K/uL   Lymphocytes Relative 6 %   Lymphs Abs 0.3 (L) 0.7 - 4.0 K/uL   Monocytes Relative 10 %   Monocytes Absolute 0.5 0.1 - 1.0 K/uL   Eosinophils Relative 0 %   Eosinophils Absolute 0.0 0.0 - 0.5 K/uL   Basophils Relative 0 %   Basophils Absolute 0.0 0.0 - 0.1 K/uL   Immature Granulocytes 3 %   Abs Immature Granulocytes 0.13 (H) 0.00 - 0.07 K/uL    Comment: Performed at [facility], [address] [place], [place] [address]  Magnesium     Status: None   Collection Time: 02/20/20  4:00 AM  Result Value Ref Range   Magnesium 2.2 1.7 - 2.4 mg/dL    Comment: Performed at [facility], [address] [place],  [place] [address]  Phosphorus     Status: Abnormal   Collection Time: 02/20/20  4:00 AM  Result Value Ref Range   Phosphorus 11.7 (H) 2.5 - 4.6 mg/dL    Comment: RESULT CONFIRMED BY MANUAL DILUTION HNM Performed at [facility], [address] [place], [place] [address]   Basic metabolic panel     Status: Abnormal   Collection Time: 02/20/20  4:00 AM  Result Value Ref Range   Sodium 135 135 - 145 mmol/L   Potassium 5.8 (H) 3.5 - 5.1 mmol/L   Chloride 91 (L) 98 - 111 mmol/L   CO2 22 22 - 32 mmol/L   Glucose, Bld 242 (H) 70 - 99 mg/dL    Comment: Glucose reference range applies only to samples taken after fasting for at least 8 hours.   BUN 80 (H) 8 - 23 mg/dL   Creatinine, Ser 12.23 (H) 0.61 - 1.24 mg/dL   Calcium 6.7 (L) 8.9 - 10.3 mg/dL   GFR, Estimated 4 (L) >60 mL/min   Anion gap 22 (H) 5 - 15    Comment: Performed at [facility], [address] [place], [place] [address]  Triglycerides     Status: Abnormal   Collection Time: 02/20/20  4:00 AM  Result Value Ref Range   Triglycerides 520 (H) <150 mg/dL    Comment:  Performed at [facility], [address] [place], [place] [address]  CK     Status: Abnormal   Collection Time: 02/20/20  4:00 AM  Result Value Ref Range   Total CK 1,869 (H) 49.0 - 397.0 U/L    Comment: Performed at [facility], [address] [place], [place] [address]  Glucose, capillary     Status: Abnormal   Collection Time: 02/20/20  4:09 AM  Result Value Ref Range   Glucose-Capillary 231 (H) 70 - 99 mg/dL    Comment: Glucose reference range applies only to samples taken after fasting for at least 8 hours.  Blood gas, arterial     Status: Abnormal   Collection Time: 02/20/20  4:12 AM  Result Value Ref Range   FIO2 0.85    Delivery systems VENTILATOR    Mode PRESSURE REGULATED VOLUME CONTROL    VT 500 mL   LHR 24 resp/min   Peep/cpap 10.0 cm H20   pH, Arterial 7.31 (L) 7.35 - 7.45   pCO2 arterial 45 32 - 48 mmHg   pO2, Arterial 138 (H) 83 - 108 mmHg   Bicarbonate 22.7 20.0 - 28.0 mmol/L   Acid-base deficit 3.4 (H) 0.0 - 2.0 mmol/L   O2 Saturation 98.9 %   Patient temperature 37.0    Collection site A-LINE    Sample type ARTERIAL DRAW     Comment: Performed at [facility], [address] [place], [place] [address]  Type and screen [place]     Status: None (Preliminary result)   Collection Time: 02/20/20  5:00 AM  Result Value Ref Range   ABO/RH(D) A POS    Antibody Screen NEG    Sample Expiration 02/01/2020,2359    Unit Number [id number]    Blood Component Type RED CELLS,LR    Unit division 00    Status of Unit ISSUED    Transfusion Status OK TO TRANSFUSE    Crossmatch Result Compatible    Unit Number [id number]    Blood Component Type RED CELLS,LR    Unit division 00    Status  of Unit ISSUED    Transfusion Status OK TO TRANSFUSE    Crossmatch Result      Compatible Performed at [facility], [address] [place], [place] [address]   Prepare RBC (crossmatch)     Status: None    Collection Time: 02/20/20  5:38 AM  Result Value Ref Range   Order Confirmation      ORDER PROCESSED BY BLOOD BANK Performed at [facility], [address] [place], [place] [address]   Glucose, capillary     Status: Abnormal   Collection Time: 02/20/20  7:33 AM  Result Value Ref Range   Glucose-Capillary 210 (H) 70 - 99 mg/dL    Comment: Glucose reference range applies only to samples taken after fasting for at least 8 hours.  Glucose, capillary     Status: Abnormal   Collection Time: 02/20/20 11:18 AM  Result Value Ref Range   Glucose-Capillary 182 (H) 70 - 99 mg/dL    Comment: Glucose reference range applies only to samples taken after fasting for at least 8 hours.  Glucose, capillary     Status: Abnormal   Collection Time: 02/20/20  4:10 PM  Result Value Ref Range   Glucose-Capillary 182 (H) 70 - 99 mg/dL    Comment: Glucose reference range applies only to samples taken after fasting for at least 8 hours.    Recent Results (from the past 240 hour(s))  SARS CORONAVIRUS 2 (TAT 6-24 HRS) Nasopharyngeal Nasopharyngeal Swab     Status: None   Collection Time: 02/12/20 11:16 AM   Specimen: Nasopharyngeal Swab  Result Value Ref Range Status   SARS Coronavirus 2 NEGATIVE NEGATIVE Final    Comment: (NOTE) SARS-CoV-2 target nucleic acids are NOT DETECTED.  The SARS-CoV-2 RNA is generally detectable in upper and lower respiratory specimens during the acute phase of infection. Negative results do not preclude SARS-CoV-2 infection, do not rule out co-infections with other pathogens, and should not be used as the sole basis for treatment or other patient management decisions. Negative results must be combined with clinical observations, patient history, and epidemiological information. The expected result is Negative.  Fact Sheet for Patients: SugarRoll.be  Fact Sheet for Healthcare Providers: https://www.woods-mathews.com/  This  test is not yet approved or cleared by the [place] FDA and  has been authorized for detection and/or diagnosis of SARS-CoV-2 by FDA under an Emergency Use Authorization (EUA). This EUA will remain  in effect (meaning this test can be used) for the duration of the COVID-19 declaration under Se ction 564(b)(1) of the Act, 21 U.S.C. section 360bbb-3(b)(1), unless the authorization is terminated or revoked sooner.  Performed at [facility], [place] [address] [place], [place] [address]   Culture, blood (routine x 2)     Status: None   Collection Time: 02/27/2020 12:29 PM   Specimen: BLOOD  Result Value Ref Range Status   Specimen Description BLOOD RIGHT ANTECUBITAL  Final   Special Requests   Final    BOTTLES DRAWN AEROBIC AND ANAEROBIC Blood Culture adequate volume   Culture   Final    NO GROWTH 5 DAYS Performed at [facility], [address] [place], [place] [address]    Report Status 02/19/2020 FINAL  Final  Culture, blood (routine x 2)     Status: None   Collection Time: 02/06/2020 12:30 PM   Specimen: BLOOD  Result Value Ref Range Status   Specimen Description BLOOD BLOOD RIGHT HAND  Final  Special Requests   Final    BOTTLES DRAWN AEROBIC AND ANAEROBIC Blood Culture adequate volume   Culture   Final    NO GROWTH 5 DAYS Performed at [facility], [address] [place], [place] [address]    Report Status 02/19/2020 FINAL  Final  MRSA PCR Screening     Status: None   Collection Time: 02/27/2020 12:48 PM   Specimen: Nasal Mucosa; Nasopharyngeal  Result Value Ref Range Status   MRSA by PCR NEGATIVE NEGATIVE Final    Comment:        The GeneXpert MRSA Assay (FDA approved for NASAL specimens only), is one component of a comprehensive MRSA colonization surveillance program. It is not intended to diagnose MRSA infection nor to guide or monitor treatment for MRSA infections. Performed at [facility], [address] [place],  [place] [address]   Culture, respiratory (non-expectorated)     Status: None (Preliminary result)   Collection Time: 02/18/20  2:11 PM   Specimen: Tracheal Aspirate; Respiratory  Result Value Ref Range Status   Specimen Description   Final    TRACHEAL ASPIRATE Performed at [facility], [address] [place], [place] [address]    Special Requests   Final    NONE Performed at [facility], [address] [place], [place] [address]    Gram Stain   Final    ABUNDANT WBC PRESENT, PREDOMINANTLY PMN ABUNDANT GRAM POSITIVE COCCI FEW YEAST    Culture   Final    FEW Normal respiratory flora-no Staph aureus or Pseudomonas seen Performed at [facility], [address], [place],  [address]    Report Status PENDING  Incomplete    Lipid Panel Recent Labs    02/20/20 0400  TRIG 520*    Studies/Results: EEG  Result Date: 02/20/2020 Lora Havens, MD     02/20/2020  3:59 PM Patient Name: Patrick Hurst MRN: [id number] Epilepsy Attending: Lora Havens Referring Physician/Provider: Dr Kerney Elbe Date: 02/20/2020 Duration: 29.44 mins  Patient history: 64 year old male s/p cardiac arrest x 10 minutes, now unresponsive on the ventilator with depressed brainstem reflexes. He is s/p TTM. EEG to evaluate for seizure  Level of alertness:  comatose  AEDs during EEG study:  Versed, valproic acid  Technical aspects: This EEG study was done with scalp electrodes positioned according to the 10-20 International system of electrode placement. Electrical activity was acquired at a sampling rate of '500Hz'  and reviewed with a high frequency filter of '70Hz'  and a low frequency filter of '1Hz' . EEG data were recorded continuously and digitally stored.  Description: EEG showed generalized periodic epileptiform discharges with overriding fast activity at 1-'2Hz'  as well as continuous generalized 3 to 5 Hz theta-delta slowing. Hyperventilation and photic stimulation were not  performed.    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  No seizures were seen during the study.  EEG appears improved compared to previous day.  Dr Cheral Marker was notified.  Lora Havens   EEG  Result Date: 02/19/2020 Lora Havens, MD     02/19/2020  4:53 PM Patient Name: Patrick Hurst MRN: [id number] Epilepsy Attending: Lora Havens Referring Physician/Provider: Dr Flora Lipps Date: 02/19/2020 Duration: 24.51 mins Patient history: 64 year old male s/p cardiac  arrest x 10 minutes, now unresponsive on the ventilator with depressed brainstem reflexes. He is s/p TTM. EEG to evaluate for seizure Level of alertness:  comatose AEDs during EEG study: None Technical aspects: This EEG study was done with scalp electrodes positioned according to the 10-20 International system of electrode placement. Electrical activity was acquired at a sampling rate of '500Hz'  and reviewed with a high frequency filter of '70Hz'  and a low frequency filter of '1Hz' . EEG data were recorded continuously and digitally stored. Description: EEG showed generalized periodic epileptiform discharges with overriding fast activity at 1-1.'5hz'  as well as generalized background suppression. Two seizures were noted at 1455 and 1504, lasting 45 seconds and 1.5 mins respectively. On video patient was noted to have sudden eye opening, upward gaze deviation and rhythmic arm twitching. Concomitant eeg showed generalized polyspikes.  Hyperventilation and photic stimulation were not performed.   ABNORMALITY - Myoclonic Seizure, generalized - Periodic epileptiform discharges with plus pattern, generalized - Background suppression, generalized IMPRESSION: This study showed two myoclonic seizures as  described above with generalized onset, at 1455 and 1504, lasting 45 seconds and 1.5 mins respectively. 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 Dr Cheral Marker was notified. Priyanka Barbra Sarks   CT ANGIO CHEST PE W OR WO CONTRAST  Result Date: 02/18/2020 CLINICAL DATA:  Respiratory failure EXAM: CT ANGIOGRAPHY CHEST WITH CONTRAST TECHNIQUE: Multidetector CT imaging of the chest was performed using the standard protocol during bolus administration of intravenous contrast. Multiplanar CT image reconstructions and MIPs were obtained to evaluate the vascular anatomy. CONTRAST:  47m OMNIPAQUE IOHEXOL 350 MG/ML SOLN COMPARISON:  11/19/2019 FINDINGS: Cardiovascular: Moderate coronary artery calcifications, most pronounced in the left anterior descending coronary artery. Aortic calcifications. No aneurysm. Mild cardiomegaly. Mediastinum/Nodes: No mediastinal, hilar, or axillary adenopathy. Endotracheal tube tip is in the midtrachea. Thyroid unremarkable. Lungs/Pleura: Lower lobe volume loss and consolidation bilaterally. This could reflect atelectasis, pneumonia, or aspiration. No effusions. Upper Abdomen: Subtle nodularity of the liver suggesting cirrhosis as seen on prior study. Prior cholecystectomy. Small amount of perihepatic ascites. Musculoskeletal: Chest wall soft tissues are unremarkable. No acute bony abnormality. Review of the MIP images confirms the above findings. IMPRESSION: No evidence of pulmonary embolus. Cardiomegaly, coronary artery disease. Volume loss and dense consolidation in the lower lobes which could reflect atelectasis, pneumonia or aspiration. Nodular contours of the liver suggest cirrhosis. Small amount of Peri hepatic ascites. Aortic Atherosclerosis (ICD10-I70.0). Electronically Signed   By: KRolm BaptiseM.D.   On: 02/18/2020 17:46   DG Chest Port 1 View  Result Date: 02/20/2020 CLINICAL DATA:  Shortness of  breath EXAM: PORTABLE CHEST 1 VIEW COMPARISON:  Two days ago FINDINGS: The endotracheal tube tip is at the clavicular heads. The enteric tube at least reaches the diaphragm. Dialysis catheter with tip at the upper cavoatrial junction. Increased hazy opacification of the bilateral lower chest compared to radiograph. By recent chest CT there is bilateral lower lobe collapse. Cardiomegaly. No pneumothorax or clear pulmonary edema. IMPRESSION: 1. Unremarkable hardware positioning. 2. Persistence of bilateral lower lobe collapse seen on recent chest CT. Electronically Signed   By: JMonte FantasiaM.D.   On: 02/20/2020 05:22    Medications:  Scheduled: . sodium chloride   Intravenous Once  . amiodarone  200 mg Per Tube BID  . chlorhexidine gluconate (MEDLINE KIT)  15 mL Mouth Rinse BID  . Chlorhexidine Gluconate Cloth  6 each Topical Daily  . docusate  100 mg  Per Tube BID  . feeding supplement (NEPRO CARB STEADY)  1,000 mL Per Tube Q24H  . feeding supplement (PROSource TF)  90 mL Per Tube 5 X Daily  . fentaNYL (SUBLIMAZE) injection  100 mcg Intravenous Once  . gentamicin cream  1 application Topical Daily  . heparin  5,000 Units Subcutaneous Q8H  . insulin aspart  0-6 Units Subcutaneous Q4H  . ipratropium-albuterol  3 mL Nebulization Q4H  . mouth rinse  15 mL Mouth Rinse 10 times per day  . methylPREDNISolone (SOLU-MEDROL) injection  20 mg Intravenous Q12H  . multivitamin  1 tablet Per Tube QHS  . polyethylene glycol  17 g Per Tube Daily  . valproic acid  5 mg/kg Per Tube TID   Continuous: . sodium chloride    . sodium chloride 10 mL/hr at 02/15/20 0600  . ampicillin-sulbactam (UNASYN) IV 3 g (02/20/20 1718)  . dexmedetomidine (PRECEDEX) IV infusion 1 mcg/kg/hr (02/20/20 1713)  . dialysis solution 1.5% low-MG/low-CA    . dialysis solution 2.5% low-MG/low-CA    . famotidine (PEPCID) IV 20 mg (02/20/20 1622)  . fentaNYL infusion INTRAVENOUS 250 mcg/hr (02/20/20 1714)  . midazolam 5 mg/hr  (02/20/20 1238)  . norepinephrine (LEVOPHED) Adult infusion Stopped (02/18/20 1527)  . 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  No seizures were seen during the study.  EEG appears improved compared to previous day.     Assessment: 64 year old male s/p cardiac arrest x 10 minutes, now unresponsive on the ventilator with depressed brainstem reflexes in the context of high dose IV sedation with fentanyl, Precedex and Versed. He is s/p TTM 1. Exam on sedation is essentially unchanged since yesterday. No evidence for higher cortical function on exam. DDx includes severe anoxic brain injury versus effects of sedation in conjunction with possibly mild to moderate anoxic brain injury.  2. MRI brain on 10/17 did not show the expected signal abnormalities corresponding to what is most likely a case of severe anoxic brain injury.  3. EEG today (10/20) appears improved relative to yesterday. However, GPEDs are still noted.  4. No myoclonus seen on today's exam.  5. Overall presentation is suggestive of anoxic brain injury, but with negative MRI, residual sedation is also on the DDx.   Recommendations: 1. Repeat EEG on Thursday (ordered).  2. Continue supportive care.  3. A repeat MRI brain is unlikely to show abnormalities if the initial MRI 3 days out was negative.  4. No myoclonus seen on today's exam. If myoclonus recurs, a valproic acid IV load of 20 mg/kg followed by scheduled dosing at 5 mg/kg per tube TID may be of benefit.  5. Eliminate all sedation if possible. Would caution against use of benzodiazepines as these can remain in the bloodstream for extended periods of time in ESRD  patients, requiring an extensive wash out period to ensure that no residual sedation is confounding the exam. In that context, a repeat neurological exam for prognostication can be performed after the patient has been off all sedation for 72 hours. Please call the Neurology team at that time.   35 minutes spent in the neurological evaluation and management of this critically ill patient.    LOS: 6 days   '@Electronically'  signed: Dr. Kerney Elbe 02/20/2020  5:22 PM

## 2020-02-20 NOTE — Progress Notes (Signed)
Patient Details  Name: Patrick Hurst MRN: [id number] Date of Birth: [birth year]  [place]ath: 1k 2.5 ca Duration: *3hr BFR:250 DFR:500 Access: cvc hEAPRIN LOCED AT END OF TREATMENT  Hepatitis Status: neg/ immune  Blood transfusion completed 340 ml PRBC Final removal 1800     Zuhayr Deeney L Asia Dusenbury 02/20/2020, 11:44 AM

## 2020-02-21 DIAGNOSIS — G9382 Brain death: Secondary | ICD-10-CM

## 2020-02-21 DIAGNOSIS — G931 Anoxic brain damage, not elsewhere classified: Secondary | ICD-10-CM | POA: Diagnosis not present

## 2020-02-21 DIAGNOSIS — Z7189 Other specified counseling: Secondary | ICD-10-CM

## 2020-02-21 DIAGNOSIS — Z9911 Dependence on respirator [ventilator] status: Secondary | ICD-10-CM

## 2020-02-21 DIAGNOSIS — I469 Cardiac arrest, cause unspecified: Secondary | ICD-10-CM | POA: Diagnosis not present

## 2020-02-21 DIAGNOSIS — Z515 Encounter for palliative care: Secondary | ICD-10-CM

## 2020-02-21 LAB — TYPE AND SCREEN
ABO/RH(D): A POS
Antibody Screen: NEGATIVE
Unit division: 0
Unit division: 0

## 2020-02-21 LAB — CBC WITH DIFFERENTIAL/PLATELET
Abs Immature Granulocytes: 0.28 10*3/uL — ABNORMAL HIGH (ref 0.00–0.07)
Basophils Absolute: 0 10*3/uL (ref 0.0–0.1)
Basophils Relative: 0 %
Eosinophils Absolute: 0 10*3/uL (ref 0.0–0.5)
Eosinophils Relative: 0 %
HCT: 24.9 % — ABNORMAL LOW (ref 39.0–52.0)
Hemoglobin: 7.8 g/dL — ABNORMAL LOW (ref 13.0–17.0)
Immature Granulocytes: 5 %
Lymphocytes Relative: 6 %
Lymphs Abs: 0.3 10*3/uL — ABNORMAL LOW (ref 0.7–4.0)
MCH: 28.9 pg (ref 26.0–34.0)
MCHC: 31.3 g/dL (ref 30.0–36.0)
MCV: 92.2 fL (ref 80.0–100.0)
Monocytes Absolute: 0.5 10*3/uL (ref 0.1–1.0)
Monocytes Relative: 9 %
Neutro Abs: 4.8 10*3/uL (ref 1.7–7.7)
Neutrophils Relative %: 80 %
Platelets: 90 10*3/uL — ABNORMAL LOW (ref 150–400)
RBC: 2.7 MIL/uL — ABNORMAL LOW (ref 4.22–5.81)
RDW: 23.9 % — ABNORMAL HIGH (ref 11.5–15.5)
Smear Review: NORMAL
WBC: 5.9 10*3/uL (ref 4.0–10.5)
nRBC: 0.7 % — ABNORMAL HIGH (ref 0.0–0.2)

## 2020-02-21 LAB — CULTURE, RESPIRATORY W GRAM STAIN: Culture: NORMAL

## 2020-02-21 LAB — GLUCOSE, CAPILLARY
Glucose-Capillary: 233 mg/dL — ABNORMAL HIGH (ref 70–99)
Glucose-Capillary: 207 mg/dL — ABNORMAL HIGH (ref 70–99)
Glucose-Capillary: 191 mg/dL — ABNORMAL HIGH (ref 70–99)
Glucose-Capillary: 168 mg/dL — ABNORMAL HIGH (ref 70–99)
Glucose-Capillary: 181 mg/dL — ABNORMAL HIGH (ref 70–99)
Glucose-Capillary: 202 mg/dL — ABNORMAL HIGH (ref 70–99)

## 2020-02-21 LAB — TRIGLYCERIDES: Triglycerides: 532 mg/dL — ABNORMAL HIGH (ref <150)

## 2020-02-21 LAB — BPAM RBC
Blood Product Expiration Date: 202110242359
Blood Product Expiration Date: 202111122359
ISSUE DATE / TIME: 202110200802
ISSUE DATE / TIME: 202110201116
Unit Type and Rh: 6200
Unit Type and Rh: 6200

## 2020-02-21 LAB — MAGNESIUM: Magnesium: 2.4 mg/dL (ref 1.7–2.4)

## 2020-02-21 LAB — PHOSPHORUS: Phosphorus: 10.4 mg/dL — ABNORMAL HIGH (ref 2.5–4.6)

## 2020-02-21 MED ORDER — METHYLPREDNISOLONE SODIUM SUCC 40 MG IJ SOLR
20.0000 mg | INTRAMUSCULAR | Status: DC
  Administered 2020-02-21 – 2020-02-22 (×2): 20 mg via INTRAVENOUS
  Filled 2020-02-21 (×2): qty 1

## 2020-02-21 NOTE — Progress Notes (Signed)
CRITICAL CARE NOTE 64 yo male presented for outpatient colonoscopy PEA cardiac arrested during the procedure, s/p 10 minutes CPR, intubated requiring mechanical ventilation with Targeted Temperature Management admitted to the ICU.  10/14Code Blue in Endo procedure, intubated, admitted to ICU for TTM 02/15/20- patient evaluated at bedside. Spoke to wife at length and reviewed various testing and diagnostics as well as therapy received and short term care plan. Levophed at 50mg/kg/min. S/p PD therapy this am. Patient with atelectasis on CXR this am, weaning FiO2 on MV with plan for SBT in am. Discussed care plan with renal team - Dr KJuleen Chinaconsidering HD.  02/16/20-patient did not pass breathing trial today, had family meeting with son and wife at bedside with RN present throughout. Plan to re-attempt SBT in AM.  02/17/20- patient had increased O2 requirement overnight and is more edematous with anasarca on exam this morning. I met with family and explained patient with numerous serious comorbid conditions and overall very poor prognosis long term. He was seen by cardiology and nephrology today. Awakening trial with no appreciable improvement in GCS, today plan to repeat SBT if FiO2 can be weaned to <40% and perform MRI brain w/wo to evaluate for ischemic changes post PEA arrest. 10/18signs of anoxic brain injury 10/19 +seizures on EEG 10/20 no seizureson EEG but svere brain damage basedon exam, multiorgan failure   CC  follow up respiratory failure  SUBJECTIVE Patient remains critically ill Prognosis is guarded  Vent Mode: PRVC FiO2 (%):  [40 %-50 %] 40 % Set Rate:  [24 bmp] 24 bmp Vt Set:  [550 mL] 550 mL PEEP:  [10 cmH20] 10 cmH20 Plateau Pressure:  [23 cmH20-24 cmH20] 24 cmH20  CBC    Component Value Date/Time   WBC 5.9 02/21/2020 0333   RBC 2.70 (L) 02/21/2020 0333   HGB 7.8 (L) 02/21/2020 0333   HGB 8.5 (L) 11/29/2019 1559   HCT 24.9 (L) 02/21/2020 0333   HCT 24.3 (L)  11/29/2019 1559   PLT 90 (L) 02/21/2020 0333   PLT 138 (L) 11/29/2019 1559   MCV 92.2 02/21/2020 0333   MCV 86 11/29/2019 1559   MCV 85 05/04/2013 1240   MCH 28.9 02/21/2020 0333   MCHC 31.3 02/21/2020 0333   RDW 23.9 (H) 02/21/2020 0333   RDW 14.4 11/29/2019 1559   RDW 15.3 (H) 05/04/2013 1240   LYMPHSABS 0.3 (L) 02/21/2020 0333   LYMPHSABS 0.7 11/29/2019 1559   LYMPHSABS 1.1 05/04/2013 1240   MONOABS 0.5 02/21/2020 0333   MONOABS 0.6 05/04/2013 1240   EOSABS 0.0 02/21/2020 0333   EOSABS 0.1 11/29/2019 1559   EOSABS 0.1 05/04/2013 1240   BASOSABS 0.0 02/21/2020 0333   BASOSABS 0.1 11/29/2019 1559   BASOSABS 0.0 05/04/2013 1240   CMP Latest Ref Rng & Units 02/20/2020 02/19/2020 02/18/2020  Glucose 70 - 99 mg/dL 242(H) 305(H) 305(H)  BUN 8 - 23 mg/dL 80(H) 88(H) 85(H)  Creatinine 0.61 - 1.24 mg/dL 12.23(H) 15.72(H) 15.29(H)  Sodium 135 - 145 mmol/L 135 135 136  Potassium 3.5 - 5.1 mmol/L 5.8(H) 5.7(H) 6.0(H)  Chloride 98 - 111 mmol/L 91(L) 88(L) 92(L)  CO2 22 - 32 mmol/L 22 20(L) 19(L)  Calcium 8.9 - 10.3 mg/dL 6.7(L) 6.8(L) 6.7(L)  Total Protein 6.5 - 8.1 g/dL - - 6.4(L)  Total Bilirubin 0.3 - 1.2 mg/dL - - 1.1  Alkaline Phos 38 - 126 U/L - - 96  AST 15 - 41 U/L - - 31  ALT 0 - 44 U/L - -  15    BP (!) 147/67 (BP Location: Other (Comment)) Comment (BP Location): ART Line  Pulse 77   Temp 98.3 F (36.8 C)   Resp (!) 24   Ht 6' (1.829 m)   Wt (!) 137.7 kg   SpO2 92%   BMI 41.17 kg/m    I/O last 3 completed shifts: In: 4447.7 [I.V.:2467; Blood:586; Other:150; NG/GT:868.5; IV Piggyback:376.2] Out: 1500 [Other:1300; Stool:200] No intake/output data recorded.  SpO2: 92 % FiO2 (%): 40 %  Estimated body mass index is 41.17 kg/m as calculated from the following:   Height as of this encounter: 6' (1.829 m).   Weight as of this encounter: 137.7 kg.  SIGNIFICANT EVENTS   REVIEW OF SYSTEMS  PATIENT IS UNABLE TO PROVIDE COMPLETE REVIEW OF SYSTEMS DUE TO SEVERE  CRITICAL ILLNESS   Pressure Injury 02/19/20 Buttocks Right Stage 2 -  Partial thickness loss of dermis presenting as a shallow open injury with a red, pink wound bed without slough. (Active)  02/19/20 2000  Location: Buttocks  Location Orientation: Right  Staging: Stage 2 -  Partial thickness loss of dermis presenting as a shallow open injury with a red, pink wound bed without slough.  Wound Description (Comments):   Present on Admission: No      PHYSICAL EXAMINATION:  GENERAL:critically ill appearing, +resp distress HEAD: Normocephalic, atraumatic.  EYES: Pupils equal, round, reactive to light.  No scleral icterus.  MOUTH: Moist mucosal membrane. NECK: Supple.  PULMONARY: +rhonchi, +wheezing CARDIOVASCULAR: S1 and S2. Regular rate and rhythm. No murmurs, rubs, or gallops.  GASTROINTESTINAL: Soft, nontender, -distended.  Positive bowel sounds.   MUSCULOSKELETAL: No swelling, clubbing, or edema.  NEUROLOGIC: obtunded, GCS<8 SKIN:intact,warm,dry  MEDICATIONS: I have reviewed all medications and confirmed regimen as documented   CULTURE RESULTS   Recent Results (from the past 240 hour(s))  SARS CORONAVIRUS 2 (TAT 6-24 HRS) Nasopharyngeal Nasopharyngeal Swab     Status: None   Collection Time: 02/12/20 11:16 AM   Specimen: Nasopharyngeal Swab  Result Value Ref Range Status   SARS Coronavirus 2 NEGATIVE NEGATIVE Final    Comment: (NOTE) SARS-CoV-2 target nucleic acids are NOT DETECTED.  The SARS-CoV-2 RNA is generally detectable in upper and lower respiratory specimens during the acute phase of infection. Negative results do not preclude SARS-CoV-2 infection, do not rule out co-infections with other pathogens, and should not be used as the sole basis for treatment or other patient management decisions. Negative results must be combined with clinical observations, patient history, and epidemiological information. The expected result is Negative.  Fact Sheet for  Patients: SugarRoll.be  Fact Sheet for Healthcare Providers: https://www.woods-mathews.com/  This test is not yet approved or cleared by the [place] FDA and  has been authorized for detection and/or diagnosis of SARS-CoV-2 by FDA under an Emergency Use Authorization (EUA). This EUA will remain  in effect (meaning this test can be used) for the duration of the COVID-19 declaration under Se ction 564(b)(1) of the Act, 21 U.S.C. section 360bbb-3(b)(1), unless the authorization is terminated or revoked sooner.  Performed at [facility], [place] [address], [place],  [address]   Culture, blood (routine x 2)     Status: None   Collection Time: 03/02/2020 12:29 PM   Specimen: BLOOD  Result Value Ref Range Status   Specimen Description BLOOD RIGHT ANTECUBITAL  Final   Special Requests   Final    BOTTLES DRAWN AEROBIC AND ANAEROBIC Blood Culture adequate volume   Culture  Final    NO GROWTH 5 DAYS Performed at [facility], [address] [place], [place] [address]    Report Status 02/19/2020 FINAL  Final  Culture, blood (routine x 2)     Status: None   Collection Time: 02/15/2020 12:30 PM   Specimen: BLOOD  Result Value Ref Range Status   Specimen Description BLOOD BLOOD RIGHT HAND  Final   Special Requests   Final    BOTTLES DRAWN AEROBIC AND ANAEROBIC Blood Culture adequate volume   Culture   Final    NO GROWTH 5 DAYS Performed at [facility], [address] [place], [place] [address]    Report Status 02/19/2020 FINAL  Final  MRSA PCR Screening     Status: None   Collection Time: 02/02/2020 12:48 PM   Specimen: Nasal Mucosa; Nasopharyngeal  Result Value Ref Range Status   MRSA by PCR NEGATIVE NEGATIVE Final    Comment:        The GeneXpert MRSA Assay (FDA approved for NASAL specimens only), is one component of a comprehensive MRSA colonization surveillance program. It is not intended to diagnose  MRSA infection nor to guide or monitor treatment for MRSA infections. Performed at [facility], [address] [place], [place] [address]   Culture, respiratory (non-expectorated)     Status: None (Preliminary result)   Collection Time: 02/18/20  2:11 PM   Specimen: Tracheal Aspirate; Respiratory  Result Value Ref Range Status   Specimen Description   Final    TRACHEAL ASPIRATE Performed at [facility], [address], [place], [place] [address]    Special Requests   Final    NONE Performed at [facility], [address] [place], [place] [address]    Gram Stain   Final    ABUNDANT WBC PRESENT, PREDOMINANTLY PMN ABUNDANT GRAM POSITIVE COCCI FEW YEAST    Culture   Final    FEW Normal respiratory flora-no Staph aureus or Pseudomonas seen Performed at [facility], [address] [place], [place] [address]    Report Status PENDING  Incomplete          IMAGING    EEG  Result Date: 02/20/2020 Lora Havens, MD     02/20/2020  3:59 PM Patient Name: Patrick Hurst MRN: [id number] Epilepsy Attending: Lora Havens Referring Physician/Provider: Dr Kerney Elbe Date: 02/20/2020 Duration: 29.44 mins  Patient history: 64 year old male s/p cardiac arrest x 10 minutes, now unresponsive on the ventilator with depressed brainstem reflexes. He is s/p TTM. EEG to evaluate for seizure  Level of alertness:  comatose  AEDs during EEG study:  Versed, valproic acid  Technical aspects: This EEG study was done with scalp electrodes positioned according to the 10-20 International system of electrode placement. Electrical activity was acquired at a sampling rate of _0  and reviewed with a high frequency filter of _1  and a low frequency filter of _2 . EEG data were recorded continuously and digitally stored.  Description: EEG showed generalized periodic epileptiform discharges with overriding fast activity at 1-_3  as well as continuous generalized 3  to 5 Hz theta-delta slowing. Hyperventilation and photic stimulation were not performed.    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  No seizures were seen during the study.  EEG  appears improved compared to previous day.  Dr Cheral Marker was notified.  [place]     Nutrition Status: Nutrition Problem: Inadequate oral intake Etiology: inability to eat (pt sedated and ventilated) Signs/Symptoms: NPO status       Indwelling Urinary Catheter continued, requirement due to   Reason to continue Indwelling Urinary Catheter strict Intake/Output monitoring for hemodynamic instability   Central Line/ continued, requirement due to  Reason to continue [place] of central venous pressure or other hemodynamic parameters and poor IV access   Ventilator continued, requirement due to severe respiratory failure   Ventilator Sedation RASS 0 to -2      ASSESSMENT AND PLAN SYNOPSIS  ACUTE SEVERE CARDIAC ARREST DUE TO ACUTE ISCHEMIC CARDIOMYOPATHY Acute and severe hypoxic resp failure, complicated by aspiration pneumonia withFINDINGS STILLCONCERNING FOR ANOXIC BRAIN INJURY WITH MULTIORGAN FAILURE, +SEZIURES  Severe ACUTE Hypoxic and Hypercapnic Respiratory Failure -continue Full MV support -continue Bronchodilator Therapy -Wean Fio2 and PEEP as tolerated -VAP/VENT bundle implementation  ACUTE SYSTOLIC CARDIAC FAILURE-  -oxygen as needed -Lasix as tolerated -follow up cardiology recs   Morbid obesity, possible OSA.   Will certainly impact respiratory mechanics, ventilator weaning Suspect will need to consider additional PEEP  END STAGE Renal Failure -continue Foley Catheter-assess need -Avoid nephrotoxic  agents -Follow urine output, BMP -Ensure adequate renal perfusion, optimize oxygenation -Renal dose medications PD/HD as needed     NEUROLOGY Acute toxic metabolic encephalopathy, need for sedation Goal RASS -2 to -3 Signs and symptoms of brain damage   CARDIAC ICU monitoring  ID -continue IV abx as prescibed -follow up cultures  GI GI PROPHYLAXIS as indicated   DIET-->TF's as tolerated Constipation protocol as indicated  ENDO - will use ICU hypoglycemic\Hyperglycemia protocol if indicated     ELECTROLYTES -follow labs as needed -replace as needed -pharmacy consultation and following   DVT/GI PRX ordered and assessed TRANSFUSIONS AS NEEDED MONITOR FSBS I Assessed the need for Labs I Assessed the need for Foley I Assessed the need for Central Venous Line Family Discussion when available I Assessed the need for Mobilization I made an Assessment of medications to be adjusted accordingly Safety Risk assessment completed   CASE DISCUSSED IN MULTIDISCIPLINARY ROUNDS WITH ICU TEAM  Critical Care Time devoted to patient care services described in this note is 75 minutes.   Overall, patient is critically ill, prognosis is guarded.  Patient with Multiorgan failure and at high risk for cardiac arrest and death.    Corrin Parker, M.D.  [facility]  Medical Director [place] Director [facility] Cardio-Pulmonary Department

## 2020-02-21 NOTE — Consult Note (Addendum)
Consultation Note Date: 02/21/2020   Patient Name: Patrick Hurst  DOB: 1955-12-05  MRN: [id number]  Age / Sex: 64 y.o., male  PCP: [name], MD Referring Physician: Flora Lipps, MD  Reason for Consultation: Establishing goals of care  HPI/Patient Profile: 64 year old male s/p cardiac arrest x 10 minutes, now unresponsive on the ventilator with depressed brainstem reflexes.   Clinical Assessment and Goals of Care: Patient is resting in bed on ventilator. Met with his wife and 2 children. They have been married 41 years.  Family voices frustration about wanting more updates but sound to have a good understanding of his current status. They discuss his declining health over the past year, and that he stopped working in August because he wanted to spend time with his family, and with working felt tired all the time. They discuss his previous hospitalizations.   We discussed his diagnoses, prognosis, GOC, EOL wishes disposition and options.  A detailed discussion was had today regarding advanced directives.  Concepts specific to code status, artifical feeding and hydration, IV antibiotics and rehospitalization were discussed.  The difference between an aggressive medical intervention path and a comfort care path was discussed.  Values and goals of care important to patient and family were attempted to be elicited.  Discussed limitations of medical interventions to prolong quality of life in some situations and discussed the concept of human mortality.  They state he would never want to be in a vegetative state. He would not want to live in a facility, and if he came home, he is a man who always has to be doing something and would not be happy having to sit around the house.    Neurology coming to speak with family, will continue conversations tomorrow.       SUMMARY OF RECOMMENDATIONS   Will  continue conversations tomorrow.    Prognosis:   Poor      Primary Diagnoses: Present on Admission: . Cardiac arrest ([place])   I have reviewed the medical record, interviewed the patient and family, and examined the patient. The following aspects are pertinent.  Past Medical History:  Diagnosis Date  . Acute on chronic diastolic CHF (congestive heart failure) ([place]) 06/21/2018  . Acute respiratory failure with hypoxia ([place]) 06/21/2018  . Bell palsy 02/12/2015  . Carpal tunnel syndrome 02/12/2015  . Dependence on nocturnal oxygen therapy    2 LITERS WITH BIPAP  . Depression   . Gastric ulcer   . GERD (gastroesophageal reflux disease)   . Gout   . History of chicken pox   . Multifocal pneumonia 03/04/2019  . Sleep apnea treated with nocturnal BiPAP    uses bipap   Social History   Socioeconomic History  . Marital status: Married    Spouse name: Hilda Blades  . Number of children: Not on file  . Years of education: Not on file  . Highest education level: Not on file  Occupational History  . Occupation: Works at [employer]    Comment:  Full time  Tobacco Use  . Smoking status: Former Smoker    Packs/day: 1.00    Years: 15.00    Pack years: 15.00    Types: Cigarettes    Quit date: 05/03/1978    Years since quitting: 41.8  . Smokeless tobacco: Never Used  . Tobacco comment: started smoking at age 44  Vaping Use  . Vaping Use: Never used  Substance and Sexual Activity  . Alcohol use: No    Alcohol/week: 0.0 standard drinks  . Drug use: No  . Sexual activity: Not Currently  Other Topics Concern  . Not on file  Social History Narrative  . Not on file   Social Determinants of Health   Financial Resource Strain:   . Difficulty of Paying Living Expenses: Not on file  Food Insecurity:   . Worried About Charity fundraiser in the Last Year: Not on file  . Ran Out of Food in the Last Year: Not on file  Transportation Needs:   . Lack of Transportation (Medical): Not on  file  . Lack of Transportation (Non-Medical): Not on file  Physical Activity:   . Days of Exercise per Week: Not on file  . Minutes of Exercise per Session: Not on file  Stress:   . Feeling of Stress : Not on file  Social Connections:   . Frequency of Communication with Friends and Family: Not on file  . Frequency of Social Gatherings with Friends and Family: Not on file  . Attends Religious Services: Not on file  . Active Member of Clubs or Organizations: Not on file  . Attends Archivist Meetings: Not on file  . Marital Status: Not on file   Family History  Problem Relation Age of Onset  . COPD Mother   . Cancer Mother   . Heart disease Father    Scheduled Meds: . sodium chloride   Intravenous Once  . amiodarone  200 mg Per Tube BID  . chlorhexidine gluconate (MEDLINE KIT)  15 mL Mouth Rinse BID  . Chlorhexidine Gluconate Cloth  6 each Topical Daily  . feeding supplement (NEPRO CARB STEADY)  1,000 mL Per Tube Q24H  . feeding supplement (PROSource TF)  90 mL Per Tube 5 X Daily  . fentaNYL (SUBLIMAZE) injection  100 mcg Intravenous Once  . gentamicin cream  1 application Topical Daily  . heparin  5,000 Units Subcutaneous Q8H  . insulin aspart  0-6 Units Subcutaneous Q4H  . ipratropium-albuterol  3 mL Nebulization Q4H  . mouth rinse  15 mL Mouth Rinse 10 times per day  . methylPREDNISolone (SOLU-MEDROL) injection  20 mg Intravenous Q24H  . multivitamin  1 tablet Per Tube QHS  . valproic acid  5 mg/kg Per Tube TID   Continuous Infusions: . sodium chloride    . sodium chloride 10 mL/hr at 02/15/20 0600  . ampicillin-sulbactam (UNASYN) IV Stopped (02/20/20 1748)  . dexmedetomidine (PRECEDEX) IV infusion 1 mcg/kg/hr (02/21/20 1519)  . dialysis solution 1.5% low-MG/low-CA    . dialysis solution 2.5% low-MG/low-CA    . famotidine (PEPCID) IV 20 mg (02/21/20 1539)  . fentaNYL infusion INTRAVENOUS 250 mcg/hr (02/21/20 1500)  . norepinephrine (LEVOPHED) Adult infusion  Stopped (02/18/20 1527)  . vasopressin Stopped (02/20/20 1200)   PRN Meds:.docusate sodium, fentaNYL, hydrALAZINE, midazolam, ondansetron (ZOFRAN) IV, polyethylene glycol, vecuronium Medications Prior to Admission:  Prior to Admission medications   Medication Sig Start Date End Date Taking? Authorizing Provider  amiodarone (PACERONE) 200 MG tablet  Take 200 mg by mouth daily. 08/16/19  Yes [provider]  amitriptyline (ELAVIL) 25 MG tablet TAKE 1 TABLET AT BEDTIME Patient taking differently: Take 25 mg by mouth at bedtime.  07/26/19  Yes [name], MD  atorvastatin (LIPITOR) 40 MG tablet Take 1 tablet (40 mg total) by mouth daily. 01/13/20  Yes [name], MD  Budeson-Glycopyrrol-Formoterol (BREZTRI AEROSPHERE) 160-9-4.8 MCG/ACT AERO Inhale 2 puffs into the lungs in the morning and at bedtime.    Yes [provider]  calcitRIOL (ROCALTROL) 0.5 MCG capsule Take 0.5 mcg by mouth daily. 12/29/18  Yes [provider]  FLOMAX 0.4 MG CAPS capsule Take 0.4 mg by mouth daily. 01/02/20  Yes [provider]  insulin regular human CONCENTRATED (HUMULIN R) 500 UNIT/ML injection Inject up to 60 units twice a day as directed by physician 02/11/20  Yes [name], MD  Ipratropium-Albuterol (COMBIVENT RESPIMAT) 20-100 MCG/ACT AERS respimat Inhale 2 puffs into the lungs every 4 (four) hours as needed for wheezing.    Yes [provider]  lactulose (CHRONULAC) 10 GM/15ML solution Take 30 mLs by mouth daily. 09/10/19  Yes [provider]  levothyroxine (SYNTHROID) 100 MCG tablet TAKE 1 TABLET DAILY Patient taking differently: Take 100 mcg by mouth daily before breakfast.  08/01/19  Yes Fisher, Kirstie Peri, MD  metoprolol tartrate (LOPRESSOR) 50 MG tablet Take 0.5 tablets (25 mg total) by mouth 2 (two) times daily. 01/01/20  Yes Shawna Clamp, MD  pantoprazole (PROTONIX) 40 MG tablet TAKE 1 TABLET TWICE A DAY Patient taking differently: Take 40 mg by  mouth 2 (two) times daily.  02/05/20  Yes [name], MD  sevelamer carbonate (RENVELA) 800 MG tablet Take by mouth. 12/27/19  Yes [provider]  torsemide (DEMADEX) 10 MG tablet Take 40 mg by mouth 2 (two) times daily.  08/13/19  Yes [provider]  ULORIC 80 MG TABS Take 80 mg by mouth daily.  05/18/17  Yes [provider]  Ascorbic Acid (VITAMIN C) 1000 MG tablet Take 1,000 mg by mouth 2 (two) times daily.     [provider]  BD INSULIN SYRINGE U/F 31G X 5/16" 1 ML MISC  11/18/19   [provider]  cetirizine (ZYRTEC) 10 MG tablet Take 10 mg by mouth daily as needed for allergies.     [provider]  ELIQUIS 2.5 MG TABS tablet Take 2.5 mg by mouth 2 (two) times daily.  03/23/19   [provider]  gentamicin cream (GARAMYCIN) 0.1 % Apply 1 application topically daily. 09/22/19   [provider]  Spacer/Aero-Holding Chambers (AEROCHAMBER PLUS WITH MASK) inhaler See admin instructions. 12/05/19   [provider]  Spacer/Aero-Holding Josiah Lobo (EASIVENT) inhaler See admin instructions. 12/05/19 12/04/20  [provider]   Allergies  Allergen Reactions  . Mucinex [Guaifenesin Er] Swelling    Throat swelling, increases heart rate  . Levaquin [Levofloxacin] Palpitations   Review of Systems  Unable to perform ROS   Physical Exam Constitutional:      Comments: On ventilator. Eyes open.     Vital Signs: BP (!) 147/67 (BP Location: Other (Comment)) Comment (BP Location): ART Line  Pulse 78   Temp 98.6 F (37 C)   Resp (!) 24   Ht 6' (1.829 m)   Wt (!) 137.7 kg   SpO2 94%   BMI 41.17 kg/m  Pain Scale: CPOT   Pain Score: 0-No pain   SpO2: SpO2: 94 % O2  Device:SpO2: 94 % O2 Flow Rate: .   IO: Intake/output summary:   Intake/Output Summary (Last 24 hours) at 02/21/2020 1611 Last data filed at 02/21/2020 1500 Gross per 24 hour  Intake 1490.96 ml  Output 0 ml  Net 1490.96 ml    LBM: Last  BM Date: 02/21/20 Baseline Weight: Weight: 130.2 kg Most recent weight: Weight: (!) 137.7 kg     Palliative Assessment/Data:     Time In: 3:10 Time Out: 4:30 Time Total: 80 min Greater than 50%  of this time was spent counseling and coordinating care related to the above assessment and plan.  Signed by: Asencion Gowda, NP   Please contact Palliative Medicine Team phone at [phone] for questions and concerns.  For individual provider: See Shea Evans

## 2020-02-21 NOTE — Progress Notes (Signed)
Dr Mortimer Fries notified of patients heart rate of 150's. At this time no additional orders. Physician aware, precedex restarted. At this time MD does not want to restart fentanyl.

## 2020-02-21 NOTE — Progress Notes (Signed)
Subjective: Eyes were observed to open after discontinuation of Versed gtt. Otherwise no change per report. Continues to be on IV sedation with Precedex and fentanyl.   Objective: Current vital signs: BP (!) 147/67 (BP Location: Other (Comment)) Comment (BP Location): ART Line  Pulse 78   Temp 98.6 F (37 C)   Resp (!) 24   Ht 6' (1.829 m)   Wt (!) 137.7 kg   SpO2 94%   BMI 41.17 kg/m  Vital signs in last 24 hours: Temp:  [97.8 F (36.6 C)-99.9 F (37.7 C)] 98.6 F (37 C) (10/21 1100) Pulse Rate:  [47-107] 78 (10/21 1500) Resp:  [17-25] 24 (10/21 1500) BP: (147-158)/(64-72) 147/67 (10/21 0400) SpO2:  [80 %-96 %] 94 % (10/21 1500) Arterial Line BP: (113-170)/(51-83) 133/67 (10/21 1500) FiO2 (%):  [35 %-50 %] 35 % (10/21 1210) Weight:  [137.7 kg] 137.7 kg (10/21 0330)  Intake/Output from previous day: 10/20 0701 - 10/21 0700 In: 2192.3 [I.V.:1306.3; Blood:586; IV Piggyback:150] Out: 1500 [Stool:200] Intake/Output this shift: Total I/O In: 637.7 [I.V.:637.7] Out: 0  Nutritional status:  Diet Order            Diet NPO time specified  Diet effective now                 Physical Exam General: Morbidly obese HEENT- [place]/AT.  Lungs-Intubated Extremities-Chronic pigmentary changes to knees and distal BLE noted.  Neurological Examination:  Exam performed after Precedex and fentanyl were held for 10 minutes.  Mental Status:Eyes initially closed, the patient awakens to sternal rub to an eyes-open unresponsive state. Does not alert to voice, track visually, or move limbs to stimulation. No attempts to communicate.  Cranial Nerves: ZO:XWRUEA 2 mm and sluggishly reactive. No blink to threat.Does slightly deviate eyes at times away from bright light by about 2-3 mm.  III,IV, VI:No doll's eye reflex present.Eyes are conjugate and at the midline. No nystagmus.  V,VII:Blinks to eyelid stimulation. VIII:No response to voice or loud clap   IX,X:Intubated VW:UJWJXB to assess JYN:WGNFAOZHY Motor/Sensory: Flaccid tone x 4.  No posturing or myoclonus noted.  No movement to any stimuli.  Deep Tendon Reflexes:0 bilateral brachioradialis, biceps and patellae. Toes mute bilaterally. Cerebellar/Gait:Unable to assess.  Lab Results: Results for orders placed or performed during the hospital encounter of 02/02/2020 (from the past 48 hour(s))  Ammonia     Status: None   Collection Time: 02/19/20  5:15 PM  Result Value Ref Range   Ammonia 29 9 - 35 umol/L    Comment: Performed at [facility], [address] [place],  [address]  Glucose, capillary     Status: Abnormal   Collection Time: 02/19/20  7:42 PM  Result Value Ref Range   Glucose-Capillary 204 (H) 70 - 99 mg/dL    Comment: Glucose reference range applies only to samples taken after fasting for at least 8 hours.  Glucose, capillary     Status: Abnormal   Collection Time: 02/19/20 11:41 PM  Result Value Ref Range   Glucose-Capillary 222 (H) 70 - 99 mg/dL    Comment: Glucose reference range applies only to samples taken after fasting for at least 8 hours.  CBC with Differential/Platelet     Status: Abnormal   Collection Time: 02/20/20  4:00 AM  Result Value Ref Range   WBC 4.9 4.0 - 10.5 K/uL   RBC 2.10 (L) 4.22 - 5.81 MIL/uL   Hemoglobin 6.3 (L) 13.0 - 17.0 g/dL   HCT 20.4 (L) 39 -  52 %   MCV 97.1 80.0 - 100.0 fL   MCH 30.0 26.0 - 34.0 pg   MCHC 30.9 30.0 - 36.0 g/dL   RDW 16.9 (H) 11.5 - 15.5 %   Platelets 91 (L) 150 - 400 K/uL    Comment: Immature Platelet Fraction may be clinically indicated, consider ordering this additional test [id number]    nRBC 1.0 (H) 0.0 - 0.2 %   Neutrophils Relative % 81 %   Neutro Abs 4.0 1.7 - 7.7 K/uL   Lymphocytes Relative 6 %   Lymphs Abs 0.3 (L) 0.7 - 4.0 K/uL   Monocytes Relative 10 %   Monocytes Absolute 0.5 0.1 - 1.0 K/uL   Eosinophils Relative 0 %   Eosinophils Absolute 0.0 0.0 - 0.5 K/uL    Basophils Relative 0 %   Basophils Absolute 0.0 0.0 - 0.1 K/uL   Immature Granulocytes 3 %   Abs Immature Granulocytes 0.13 (H) 0.00 - 0.07 K/uL    Comment: Performed at [facility], [address] [place], [place] [address]  Magnesium     Status: None   Collection Time: 02/20/20  4:00 AM  Result Value Ref Range   Magnesium 2.2 1.7 - 2.4 mg/dL    Comment: Performed at [facility], [address] [place], [place] [address]  Phosphorus     Status: Abnormal   Collection Time: 02/20/20  4:00 AM  Result Value Ref Range   Phosphorus 11.7 (H) 2.5 - 4.6 mg/dL    Comment: RESULT CONFIRMED BY MANUAL DILUTION HNM Performed at [facility], [address] [place], [place] [address]   Basic metabolic panel     Status: Abnormal   Collection Time: 02/20/20  4:00 AM  Result Value Ref Range   Sodium 135 135 - 145 mmol/L   Potassium 5.8 (H) 3.5 - 5.1 mmol/L   Chloride 91 (L) 98 - 111 mmol/L   CO2 22 22 - 32 mmol/L   Glucose, Bld 242 (H) 70 - 99 mg/dL    Comment: Glucose reference range applies only to samples taken after fasting for at least 8 hours.   BUN 80 (H) 8 - 23 mg/dL   Creatinine, Ser 12.23 (H) 0.61 - 1.24 mg/dL   Calcium 6.7 (L) 8.9 - 10.3 mg/dL   GFR, Estimated 4 (L) >60 mL/min   Anion gap 22 (H) 5 - 15    Comment: Performed at [facility], [address] [place], [place] [address]  Triglycerides     Status: Abnormal   Collection Time: 02/20/20  4:00 AM  Result Value Ref Range   Triglycerides 520 (H) <150 mg/dL    Comment: Performed at [facility], [address] [place], [place] [address]  CK     Status: Abnormal   Collection Time: 02/20/20  4:00 AM  Result Value Ref Range   Total CK 1,869 (H) 49.0 - 397.0 U/L    Comment: Performed at [facility], [address] [place],  [address]  Glucose, capillary     Status: Abnormal   Collection Time: 02/20/20  4:09 AM  Result Value Ref Range   Glucose-Capillary  231 (H) 70 - 99 mg/dL    Comment: Glucose reference range applies only to samples taken after fasting for at least 8 hours.  Blood gas, arterial     Status: Abnormal   Collection Time: 02/20/20  4:12 AM  Result Value Ref Range   FIO2 0.85    Delivery  systems VENTILATOR    Mode PRESSURE REGULATED VOLUME CONTROL    VT 500 mL   LHR 24 resp/min   Peep/cpap 10.0 cm H20   pH, Arterial 7.31 (L) 7.35 - 7.45   pCO2 arterial 45 32 - 48 mmHg   pO2, Arterial 138 (H) 83 - 108 mmHg   Bicarbonate 22.7 20.0 - 28.0 mmol/L   Acid-base deficit 3.4 (H) 0.0 - 2.0 mmol/L   O2 Saturation 98.9 %   Patient temperature 37.0    Collection site A-LINE    Sample type ARTERIAL DRAW     Comment: Performed at [facility], [address] [place], [place] [address]  Type and screen [place]     Status: None   Collection Time: 02/20/20  5:00 AM  Result Value Ref Range   ABO/RH(D) A POS    Antibody Screen NEG    Sample Expiration 02/25/2020,2359    Unit Number [id number]    Blood Component Type RED CELLS,LR    Unit division 00    Status of Unit ISSUED,FINAL    Transfusion Status OK TO TRANSFUSE    Crossmatch Result      Compatible Performed at [facility], [address] [place], [place] [address]    Unit Number [id number]    Blood Component Type RED CELLS,LR    Unit division 00    Status of Unit ISSUED,FINAL    Transfusion Status OK TO TRANSFUSE    Crossmatch Result Compatible   Prepare RBC (crossmatch)     Status: None   Collection Time: 02/20/20  5:38 AM  Result Value Ref Range   Order Confirmation      ORDER PROCESSED BY BLOOD BANK Performed at [facility], [address] [place], [place] [address]   Glucose, capillary     Status: Abnormal   Collection Time: 02/20/20  7:33 AM  Result Value Ref Range   Glucose-Capillary 210 (H) 70 - 99 mg/dL    Comment: Glucose reference range applies only to samples taken after fasting for at  least 8 hours.  Glucose, capillary     Status: Abnormal   Collection Time: 02/20/20 11:18 AM  Result Value Ref Range   Glucose-Capillary 182 (H) 70 - 99 mg/dL    Comment: Glucose reference range applies only to samples taken after fasting for at least 8 hours.  Glucose, capillary     Status: Abnormal   Collection Time: 02/20/20  4:10 PM  Result Value Ref Range   Glucose-Capillary 182 (H) 70 - 99 mg/dL    Comment: Glucose reference range applies only to samples taken after fasting for at least 8 hours.  Glucose, capillary     Status: Abnormal   Collection Time: 02/20/20  7:15 PM  Result Value Ref Range   Glucose-Capillary 195 (H) 70 - 99 mg/dL    Comment: Glucose reference range applies only to samples taken after fasting for at least 8 hours.  Glucose, capillary     Status: Abnormal   Collection Time: 02/20/20 11:48 PM  Result Value Ref Range   Glucose-Capillary 192 (H) 70 - 99 mg/dL    Comment: Glucose reference range applies only to samples taken after fasting for at least 8 hours.  Glucose, capillary     Status: Abnormal   Collection Time: 02/21/20  3:23 AM  Result Value Ref Range   Glucose-Capillary 233 (H) 70 - 99 mg/dL    Comment: Glucose reference range applies only to samples taken  after fasting for at least 8 hours.  CBC with Differential/Platelet     Status: Abnormal   Collection Time: 02/21/20  3:33 AM  Result Value Ref Range   WBC 5.9 4.0 - 10.5 K/uL   RBC 2.70 (L) 4.22 - 5.81 MIL/uL   Hemoglobin 7.8 (L) 13.0 - 17.0 g/dL   HCT 24.9 (L) 39 - 52 %   MCV 92.2 80.0 - 100.0 fL   MCH 28.9 26.0 - 34.0 pg   MCHC 31.3 30.0 - 36.0 g/dL   RDW 23.9 (H) 11.5 - 15.5 %   Platelets 90 (L) 150 - 400 K/uL    Comment: Immature Platelet Fraction may be clinically indicated, consider ordering this additional test [id number]    nRBC 0.7 (H) 0.0 - 0.2 %   Neutrophils Relative % 80 %   Neutro Abs 4.8 1.7 - 7.7 K/uL   Lymphocytes Relative 6 %   Lymphs Abs 0.3 (L) 0.7 - 4.0 K/uL    Monocytes Relative 9 %   Monocytes Absolute 0.5 0.1 - 1.0 K/uL   Eosinophils Relative 0 %   Eosinophils Absolute 0.0 0.0 - 0.5 K/uL   Basophils Relative 0 %   Basophils Absolute 0.0 0.0 - 0.1 K/uL   WBC Morphology MORPHOLOGY UNREMARKABLE    RBC Morphology MORPHOLOGY UNREMARKABLE    Smear Review Normal platelet morphology    Immature Granulocytes 5 %   Abs Immature Granulocytes 0.28 (H) 0.00 - 0.07 K/uL    Comment: Performed at [facility], [address], [place], [place] [address]  Magnesium     Status: None   Collection Time: 02/21/20  3:33 AM  Result Value Ref Range   Magnesium 2.4 1.7 - 2.4 mg/dL    Comment: Performed at [facility], [address] [place], [place] [address]  Phosphorus     Status: Abnormal   Collection Time: 02/21/20  3:33 AM  Result Value Ref Range   Phosphorus 10.4 (H) 2.5 - 4.6 mg/dL    Comment: Performed at [facility], [address] [place], [place] [address]  Triglycerides     Status: Abnormal   Collection Time: 02/21/20  3:33 AM  Result Value Ref Range   Triglycerides 532 (H) <150 mg/dL    Comment: Performed at [facility], [address] [place], [place] [address]  Glucose, capillary     Status: Abnormal   Collection Time: 02/21/20  7:23 AM  Result Value Ref Range   Glucose-Capillary 207 (H) 70 - 99 mg/dL    Comment: Glucose reference range applies only to samples taken after fasting for at least 8 hours.  Glucose, capillary     Status: Abnormal   Collection Time: 02/21/20 11:22 AM  Result Value Ref Range   Glucose-Capillary 191 (H) 70 - 99 mg/dL    Comment: Glucose reference range applies only to samples taken after fasting for at least 8 hours.  Glucose, capillary     Status: Abnormal   Collection Time: 02/21/20  3:49 PM  Result Value Ref Range   Glucose-Capillary 168 (H) 70 - 99 mg/dL    Comment: Glucose reference range applies only to samples taken after fasting for at least 8 hours.     Recent Results (from the past 240 hour(s))  SARS CORONAVIRUS 2 (TAT 6-24 HRS) Nasopharyngeal Nasopharyngeal Swab     Status: None   Collection Time: 02/12/20 11:16 AM   Specimen: Nasopharyngeal Swab  Result Value Ref Range Status   SARS Coronavirus  2 NEGATIVE NEGATIVE Final    Comment: (NOTE) SARS-CoV-2 target nucleic acids are NOT DETECTED.  The SARS-CoV-2 RNA is generally detectable in upper and lower respiratory specimens during the acute phase of infection. Negative results do not preclude SARS-CoV-2 infection, do not rule out co-infections with other pathogens, and should not be used as the sole basis for treatment or other patient management decisions. Negative results must be combined with clinical observations, patient history, and epidemiological information. The expected result is Negative.  Fact Sheet for Patients: SugarRoll.be  Fact Sheet for Healthcare Providers: https://www.woods-mathews.com/  This test is not yet approved or cleared by the [place] FDA and  has been authorized for detection and/or diagnosis of SARS-CoV-2 by FDA under an Emergency Use Authorization (EUA). This EUA will remain  in effect (meaning this test can be used) for the duration of the COVID-19 declaration under Se ction 564(b)(1) of the Act, 21 U.S.C. section 360bbb-3(b)(1), unless the authorization is terminated or revoked sooner.  Performed at [facility], [place] [address] [place], [place] [address]   Culture, blood (routine x 2)     Status: None   Collection Time: 02/26/2020 12:29 PM   Specimen: BLOOD  Result Value Ref Range Status   Specimen Description BLOOD RIGHT ANTECUBITAL  Final   Special Requests   Final    BOTTLES DRAWN AEROBIC AND ANAEROBIC Blood Culture adequate volume   Culture   Final    NO GROWTH 5 DAYS Performed at [facility], [address] [place], [place] [address]    Report Status 02/19/2020  FINAL  Final  Culture, blood (routine x 2)     Status: None   Collection Time: 02/25/2020 12:30 PM   Specimen: BLOOD  Result Value Ref Range Status   Specimen Description BLOOD BLOOD RIGHT HAND  Final   Special Requests   Final    BOTTLES DRAWN AEROBIC AND ANAEROBIC Blood Culture adequate volume   Culture   Final    NO GROWTH 5 DAYS Performed at [facility], [address] [place], [place] [address]    Report Status 02/19/2020 FINAL  Final  MRSA PCR Screening     Status: None   Collection Time: 02/22/2020 12:48 PM   Specimen: Nasal Mucosa; Nasopharyngeal  Result Value Ref Range Status   MRSA by PCR NEGATIVE NEGATIVE Final    Comment:        The GeneXpert MRSA Assay (FDA approved for NASAL specimens only), is one component of a comprehensive MRSA colonization surveillance program. It is not intended to diagnose MRSA infection nor to guide or monitor treatment for MRSA infections. Performed at [facility], [address] [place], [place] [address]   Culture, respiratory (non-expectorated)     Status: None   Collection Time: 02/18/20  2:11 PM   Specimen: Tracheal Aspirate; Respiratory  Result Value Ref Range Status   Specimen Description   Final    TRACHEAL ASPIRATE Performed at [facility], [address], [place], [place] [address]    Special Requests   Final    NONE Performed at [facility], [address] [place], [place] [address]    Gram Stain   Final    ABUNDANT WBC PRESENT, PREDOMINANTLY PMN ABUNDANT GRAM POSITIVE COCCI FEW YEAST    Culture   Final    FEW Normal respiratory flora-no Staph aureus or Pseudomonas seen Performed at [facility], [address], [place], [place] [address]  Report Status 02/21/2020 FINAL  Final    Lipid Panel Recent Labs    02/21/20 0333  TRIG 532*    Studies/Results: EEG  Result Date: 02/21/2020 Lora Havens, MD     02/21/2020  3:02 PM Patient Name:Patrick Hurst  [place] Lathe CVE:[id number] Epilepsy Attending:Priyanka Barbra Sarks Referring Physician/Provider:Dr Kerney Elbe Date:02/21/2020 Duration:26.49 mins  Patient history:64 year old male s/p cardiac arrest x 10 minutes, now unresponsive on the ventilator with depressed brainstem reflexes. He is s/p TTM. EEG to evaluate for seizure  Level of alertness:comatose  AEDs during EEG study: valproic acid  Technical aspects: This EEG study was done with scalp electrodes positioned according to the 10-20 International system of electrode placement. Electrical activity was acquired at a sampling rate of '500Hz'  and reviewed with a high frequency filter of '70Hz'  and a low frequency filter of '1Hz' . EEG data were recorded continuously and digitally stored.  Description:EEG showed generalized periodic epileptiform discharges with overriding fast activity at 0.25-0.'5Hz'  as well as generalized spikes at 1 Hz Continuous generalized 3 to 5 Hz theta-delta slowing was also noted.Hyperventilation and photic stimulation were not performed.   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  No seizures were seen during the study.  EEG appears similar to previous day.    EEG  Result Date: 02/20/2020 Lora Havens, MD     02/20/2020  3:59 PM Patient Name: Patrick Hurst MRN: [id number] Epilepsy Attending: Lora Havens Referring Physician/Provider: Dr Kerney Elbe Date: 02/20/2020 Duration: 29.44 mins  Patient history: 64 year old male s/p cardiac arrest x 10 minutes, now unresponsive on the ventilator with depressed brainstem reflexes. He is s/p TTM. EEG to evaluate for seizure  Level of alertness:  comatose  AEDs during EEG study:  Versed, valproic acid   Technical aspects: This EEG study was done with scalp electrodes positioned according to the 10-20 International system of electrode placement. Electrical activity was acquired at a sampling rate of '500Hz'  and reviewed with a high frequency filter of '70Hz'  and a low frequency filter of '1Hz' . EEG data were recorded continuously and digitally stored.  Description: EEG showed generalized periodic epileptiform discharges with overriding fast activity at 1-'2Hz'  as well as continuous generalized 3 to 5 Hz theta-delta slowing. Hyperventilation and photic stimulation were not performed.    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  No seizures were seen during the study.  EEG appears improved compared to previous day.  Dr Cheral Marker was notified.  Lora Havens   EEG  Result Date: 02/19/2020 Lora Havens, MD     02/19/2020  4:53 PM Patient Name: Patrick Hurst MRN: [id number] Epilepsy Attending: Lora Havens Referring Physician/Provider: Dr Flora Lipps Date: 02/19/2020 Duration: 24.51 mins Patient history: 64 year old male s/p cardiac arrest x 10 minutes, now unresponsive on the ventilator with depressed brainstem reflexes. He is s/p TTM. EEG to evaluate for seizure Level of alertness:  comatose AEDs during EEG study: None Technical aspects: This EEG study was done with scalp electrodes positioned according to the 10-20 International system of electrode placement. Electrical activity was acquired at a sampling  rate of '500Hz'  and reviewed with a high frequency filter of '70Hz'  and a low frequency filter of '1Hz' . EEG data were recorded continuously and digitally stored. Description: EEG showed generalized periodic epileptiform discharges with overriding fast  activity at 1-1.'5hz'  as well as generalized background suppression. Two seizures were noted at 1455 and 1504, lasting 45 seconds and 1.5 mins respectively. On video patient was noted to have sudden eye opening, upward gaze deviation and rhythmic arm twitching. Concomitant eeg showed generalized polyspikes.  Hyperventilation and photic stimulation were not performed.   ABNORMALITY - Myoclonic Seizure, generalized - Periodic epileptiform discharges with plus pattern, generalized - Background suppression, generalized IMPRESSION: This study showed two myoclonic seizures as described above with generalized onset, at 1455 and 1504, lasting 45 seconds and 1.5 mins respectively. 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 Dr Cheral Marker was notified. Lora Havens   DG Chest Port 1 View  Result Date: 02/20/2020 CLINICAL DATA:  Shortness of breath EXAM: PORTABLE CHEST 1 VIEW COMPARISON:  Two days ago FINDINGS: The endotracheal tube tip is at the clavicular heads. The enteric tube at least reaches the diaphragm. Dialysis catheter with tip at the upper cavoatrial junction. Increased hazy opacification of the bilateral lower chest compared to radiograph. By recent chest CT there is bilateral lower lobe collapse. Cardiomegaly. No pneumothorax or clear pulmonary edema. IMPRESSION: 1. Unremarkable hardware positioning. 2. Persistence of bilateral lower lobe collapse seen on recent chest CT. Electronically Signed   By: Monte Fantasia M.D.   On: 02/20/2020 05:22    Medications:  Scheduled: . sodium chloride   Intravenous Once  . amiodarone  200 mg Per Tube BID  . chlorhexidine gluconate (MEDLINE KIT)  15 mL Mouth Rinse BID  . Chlorhexidine Gluconate Cloth  6 each Topical Daily  . feeding supplement (NEPRO CARB STEADY)  1,000 mL Per Tube Q24H  . feeding supplement (PROSource TF)  90 mL Per Tube 5 X Daily  . fentaNYL (SUBLIMAZE) injection  100  mcg Intravenous Once  . gentamicin cream  1 application Topical Daily  . heparin  5,000 Units Subcutaneous Q8H  . insulin aspart  0-6 Units Subcutaneous Q4H  . ipratropium-albuterol  3 mL Nebulization Q4H  . mouth rinse  15 mL Mouth Rinse 10 times per day  . methylPREDNISolone (SOLU-MEDROL) injection  20 mg Intravenous Q24H  . multivitamin  1 tablet Per Tube QHS  . valproic acid  5 mg/kg Per Tube TID   Continuous: . sodium chloride    . sodium chloride 10 mL/hr at 02/15/20 0600  . ampicillin-sulbactam (UNASYN) IV Stopped (02/20/20 1748)  . dexmedetomidine (PRECEDEX) IV infusion 1 mcg/kg/hr (02/21/20 1519)  . dialysis solution 1.5% low-MG/low-CA    . dialysis solution 2.5% low-MG/low-CA    . famotidine (PEPCID) IV 20 mg (02/21/20 1539)  . fentaNYL infusion INTRAVENOUS 250 mcg/hr (02/21/20 1500)  . norepinephrine (LEVOPHED) Adult infusion Stopped (02/18/20 1527)  . 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 No seizures were seen during the study. EEG appears similar to previous day.     Assessment:64 year old male s/p cardiac arrest x 10 minutes. Has been unresponsive on the ventilator with depressed brainstem reflexes in the context of high  dose IV sedation with fentanyl, Precedex and Versed. He is s/p TTM. Versed was stopped on Wednesday. Exam improved to vegetative state on Thursday (10/21) relative to comatose versus heavily sedated on Wednesday.  1.Exam after sedation stopped x 10 minutes is improved relative to yesterday. 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 DDx includes severe anoxic brain injury versus lingering effects of sedation in the  setting of his ESRD in conjunction with possibly mild to moderate anoxic brain injury.  2. MRI brain on 10/17 did not show the expected signal abnormalities corresponding to what is clinically felt most likely to be a case of severe anoxic brain injury. This militates somewhat in favor of lingering sedation as a possible etiology for his clinical exam findings of comatose state improving to vegetative state after Versed was stopped.  3. EEG today (10/21) appears similar relative to yesterday. GPEDs, generalized spikes and continuous generalized slowing are noted.  4. No myoclonus seen on today's exam.On valproic acid at 5 mg/kg per tube TID.   Recommendations: 1.Continue supportive care.  2. A repeat MRI brain is unlikely to show abnormalities as the initial MRI 3 days out was negative.  3. Eliminate all sedation if possible. Would caution against use of benzodiazepines as these can remain in the bloodstream for extended periods of time in ESRD patients, requiring an extensive wash out period to ensure that no residual sedation is confounding the exam. In that context, a repeat neurological exam for prognostication can be performed after the patient has been off all sedation for 72 hours. Please call the Neurology team at that time.   35 minutes spent in the neurological evaluation and management of this critically ill patient   LOS: 7 days   '@Electronically'  signed: Dr. Kerney Elbe 02/21/2020  3:58 PM

## 2020-02-21 NOTE — Progress Notes (Signed)
Report given to [place] RN at 14:30 as this RN had to take and stay with another patient for a procedure.

## 2020-02-21 NOTE — Progress Notes (Signed)
[facility]  ROUNDING NOTE   Subjective:   Patient critically ill, stays dependent on mechanical ventillation FiO2 down to 50% today  He is sedated with fentanyl and Precedex Patient received hemodialysis yesterday Planning another session of hemodialysis today Will also plan for peritoneal dialysis tonight   Objective:  Vital signs in last 24 hours:  Temp:  [97.8 F (36.6 C)-100.2 F (37.9 C)] 98.3 F (36.8 C) (10/21 0700) Pulse Rate:  [42-107] 77 (10/21 0700) Resp:  [17-25] 24 (10/21 0700) BP: (123-158)/(64-72) 147/67 (10/21 0400) SpO2:  [91 %-100 %] 92 % (10/21 0700) Arterial Line BP: (109-170)/(51-83) 152/74 (10/21 1030) FiO2 (%):  [40 %-50 %] 40 % (10/21 1051) Weight:  [137.7 kg] 137.7 kg (10/21 0330)  Weight change: 4.1 kg Filed Weights   02/19/20 0432 02/20/20 0248 02/21/20 0330  Weight: 132.8 kg 133.6 kg (!) 137.7 kg    Intake/Output: I/O last 3 completed shifts: In: 4447.7 [I.V.:2467; Blood:586; Other:150; NG/GT:868.5; IV Piggyback:376.2] Out: 1500 [Other:1300; Stool:200]   Intake/Output this shift:  No intake/output data recorded.  Physical Exam: General:  Appears critically ill   Head:  Normocephalic, atraumatic, +ETT, +OGT  Eyes:  Sclerae and conjunctivae clear  Lungs:   Lungs diminished at the bases,mechanical ventillation  Heart:  A Flutter in 70's,S1S2,no rubs or gallops  Abdomen:   obese  Extremities:  No peripheral edema.  Neurologic:  sedated  Skin: No acute lesions or rashes noted  Access: PD catheter, RIJ permcath, and left radiocephalic AVF +bruit     Basic Metabolic Panel: Recent Labs  Lab 02/17/20 0415 02/17/20 0415 02/18/20 0351 02/18/20 0351 02/18/20 1454 02/18/20 2300 02/19/20 0450 02/20/20 0400 02/21/20 0333  NA 136  --  134*  --   --  136 135 135  --   K 4.9   < > 5.6*  --  5.8* 6.0* 5.7* 5.8*  --   CL 90*  --  88*  --   --  92* 88* 91*  --   CO2 20*  --  20*  --   --  19* 20* 22  --   GLUCOSE 178*  --   195*  --   --  305* 305* 242*  --   BUN 61*  --  68*  --   --  85* 88* 80*  --   CREATININE 13.95*  --  15.33*  --   --  15.29* 15.72* 12.23*  --   CALCIUM 6.7*   < > 6.9*   < >  --  6.7* 6.8* 6.7*  --   MG 2.0  --  2.3  --   --   --  2.4 2.2 2.4  PHOS 10.0*  --  12.7*  --   --   --  14.1* 11.7* 10.4*   < > = values in this interval not displayed.    Liver Function Tests: Recent Labs  Lab 02/15/20 1409 02/18/20 2300  AST  --  31  ALT  --  15  ALKPHOS  --  96  BILITOT  --  1.1  PROT  --  6.4*  ALBUMIN 2.9* 2.6*   No results for input(s): LIPASE, AMYLASE in the last 168 hours. Recent Labs  Lab 02/19/20 1715  AMMONIA 29    CBC: Recent Labs  Lab 02/17/20 0415 02/18/20 0351 02/19/20 0450 02/20/20 0400 02/21/20 0333  WBC 6.9 6.9 4.7 4.9 5.9  NEUTROABS 4.9 5.7 4.0 4.0 4.8  HGB 8.0* 7.9* 7.1* 6.3* 7.8*  HCT 22.0* 23.5* 20.9* 20.4* 24.9*  MCV 92.8 93.3 93.3 97.1 92.2  PLT 92* 84* 94* 91* 90*    Cardiac Enzymes: Recent Labs  Lab 02/20/20 0400  CKTOTAL 1,869*    BNP: Invalid input(s): POCBNP  CBG: Recent Labs  Lab 02/20/20 1915 02/20/20 2348 02/21/20 0323 02/21/20 0723 02/21/20 1122  GLUCAP 195* 192* 233* 207* 191*    Microbiology: Results for orders placed or performed during the hospital encounter of 02/10/2020  Culture, blood (routine x 2)     Status: None   Collection Time: 03/01/2020 12:29 PM   Specimen: BLOOD  Result Value Ref Range Status   Specimen Description BLOOD RIGHT ANTECUBITAL  Final   Special Requests   Final    BOTTLES DRAWN AEROBIC AND ANAEROBIC Blood Culture adequate volume   Culture   Final    NO GROWTH 5 DAYS Performed at [facility], [address] [place], [place] [address]    Report Status 02/19/2020 FINAL  Final  Culture, blood (routine x 2)     Status: None   Collection Time: 02/03/2020 12:30 PM   Specimen: BLOOD  Result Value Ref Range Status   Specimen Description BLOOD BLOOD RIGHT HAND  Final   Special  Requests   Final    BOTTLES DRAWN AEROBIC AND ANAEROBIC Blood Culture adequate volume   Culture   Final    NO GROWTH 5 DAYS Performed at [facility], [address] [place], [place] [address]    Report Status 02/19/2020 FINAL  Final  MRSA PCR Screening     Status: None   Collection Time: 03/02/2020 12:48 PM   Specimen: Nasal Mucosa; Nasopharyngeal  Result Value Ref Range Status   MRSA by PCR NEGATIVE NEGATIVE Final    Comment:        The GeneXpert MRSA Assay (FDA approved for NASAL specimens only), is one component of a comprehensive MRSA colonization surveillance program. It is not intended to diagnose MRSA infection nor to guide or monitor treatment for MRSA infections. Performed at [facility], [address] [place], [place] [address]   Culture, respiratory (non-expectorated)     Status: None   Collection Time: 02/18/20  2:11 PM   Specimen: Tracheal Aspirate; Respiratory  Result Value Ref Range Status   Specimen Description   Final    TRACHEAL ASPIRATE Performed at [facility], [address], [place], [place] [address]    Special Requests   Final    NONE Performed at [facility], [address] [place], [place] [address]    Gram Stain   Final    ABUNDANT WBC PRESENT, PREDOMINANTLY PMN ABUNDANT GRAM POSITIVE COCCI FEW YEAST    Culture   Final    FEW Normal respiratory flora-no Staph aureus or Pseudomonas seen Performed at [facility], [address] [place],  [address]    Report Status 02/21/2020 FINAL  Final    Coagulation Studies: No results for input(s): LABPROT, INR in the last 72 hours.  Urinalysis: No results for input(s): COLORURINE, LABSPEC, PHURINE, GLUCOSEU, HGBUR, BILIRUBINUR, KETONESUR, PROTEINUR, UROBILINOGEN, NITRITE, LEUKOCYTESUR in the last 72 hours.  Invalid input(s): APPERANCEUR    Imaging: EEG  Result Date: 02/20/2020 Lora Havens, MD     02/20/2020  3:59 PM Patient  Name: Patrick Hurst MRN: [id number] Epilepsy Attending: Lora Havens Referring Physician/Provider: Dr Kerney Elbe Date: 02/20/2020 Duration: 29.44 mins  Patient history: 64 year old male s/p cardiac arrest x  10 minutes, now unresponsive on the ventilator with depressed brainstem reflexes. He is s/p TTM. EEG to evaluate for seizure  Level of alertness:  comatose  AEDs during EEG study:  Versed, valproic acid  Technical aspects: This EEG study was done with scalp electrodes positioned according to the 10-20 International system of electrode placement. Electrical activity was acquired at a sampling rate of _0  and reviewed with a high frequency filter of _1  and a low frequency filter of _2 . EEG data were recorded continuously and digitally stored.  Description: EEG showed generalized periodic epileptiform discharges with overriding fast activity at 1-_3  as well as continuous generalized 3 to 5 Hz theta-delta slowing. Hyperventilation and photic stimulation were not performed.    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  No seizures were seen during the study.  EEG appears improved compared to previous day.  Dr Cheral Marker was notified.  Lora Havens   EEG  Result Date: 02/19/2020 Lora Havens, MD     02/19/2020  4:53 PM Patient Name: Patrick Hurst MRN: [id number] Epilepsy Attending: Lora Havens Referring Physician/Provider: Dr Flora Lipps Date: 02/19/2020 Duration: 24.51 mins Patient history: 64 year old male s/p cardiac arrest x 10 minutes, now unresponsive on the ventilator with depressed brainstem reflexes. He is s/p TTM. EEG to evaluate for seizure Level of alertness:  comatose AEDs during EEG study: None Technical aspects:  This EEG study was done with scalp electrodes positioned according to the 10-20 International system of electrode placement. Electrical activity was acquired at a sampling rate of _4  and reviewed with a high frequency filter of _5  and a low frequency filter of _6 . EEG data were recorded continuously and digitally stored. Description: EEG showed generalized periodic epileptiform discharges with overriding fast activity at 1-1._7  as well as generalized background suppression. Two seizures were noted at 1455 and 1504, lasting 45 seconds and 1.5 mins respectively. On video patient was noted to have sudden eye opening, upward gaze deviation and rhythmic arm twitching. Concomitant eeg showed generalized polyspikes.  Hyperventilation and photic stimulation were not performed.   ABNORMALITY - Myoclonic Seizure, generalized - Periodic epileptiform discharges with plus pattern, generalized - Background suppression, generalized IMPRESSION: This study showed two myoclonic seizures as described above with generalized onset, at 1455 and 1504, lasting 45 seconds and 1.5 mins respectively. 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 Dr Cheral Marker was notified. Lora Havens   DG Chest Port 1 View  Result Date: 02/20/2020 CLINICAL DATA:  Shortness of breath EXAM: PORTABLE CHEST 1 VIEW COMPARISON:  Two days ago FINDINGS: The endotracheal tube tip is at the clavicular heads. The enteric tube at least reaches the diaphragm. Dialysis catheter with tip at the upper cavoatrial junction. Increased hazy opacification of the bilateral lower chest compared to radiograph. By recent chest CT there is bilateral lower lobe collapse. Cardiomegaly. No pneumothorax or clear pulmonary edema. IMPRESSION: 1. Unremarkable hardware positioning. 2. Persistence of bilateral lower lobe collapse seen on recent chest CT. Electronically Signed   By: Monte Fantasia M.D.    On: 02/20/2020 05:22     Medications:   . sodium chloride    . sodium chloride 10 mL/hr at 02/15/20 0600  . ampicillin-sulbactam (UNASYN) IV Stopped (02/20/20 1748)  .  dexmedetomidine (PRECEDEX) IV infusion 1 mcg/kg/hr (02/21/20 1234)  . dialysis solution 1.5% low-MG/low-CA    . dialysis solution 2.5% low-MG/low-CA    . famotidine (PEPCID) IV Stopped (02/20/20 1652)  . fentaNYL infusion INTRAVENOUS 250 mcg/hr (02/21/20 1236)  . norepinephrine (LEVOPHED) Adult infusion Stopped (02/18/20 1527)  . vasopressin Stopped (02/20/20 1200)   . sodium chloride   Intravenous Once  . amiodarone  200 mg Per Tube BID  . chlorhexidine gluconate (MEDLINE KIT)  15 mL Mouth Rinse BID  . Chlorhexidine Gluconate Cloth  6 each Topical Daily  . feeding supplement (NEPRO CARB STEADY)  1,000 mL Per Tube Q24H  . feeding supplement (PROSource TF)  90 mL Per Tube 5 X Daily  . fentaNYL (SUBLIMAZE) injection  100 mcg Intravenous Once  . gentamicin cream  1 application Topical Daily  . heparin  5,000 Units Subcutaneous Q8H  . insulin aspart  0-6 Units Subcutaneous Q4H  . ipratropium-albuterol  3 mL Nebulization Q4H  . mouth rinse  15 mL Mouth Rinse 10 times per day  . methylPREDNISolone (SOLU-MEDROL) injection  20 mg Intravenous Q24H  . multivitamin  1 tablet Per Tube QHS  . valproic acid  5 mg/kg Per Tube TID   docusate sodium, fentaNYL, hydrALAZINE, midazolam, ondansetron (ZOFRAN) IV, polyethylene glycol, vecuronium  Assessment/ Plan:  Mr. HERLEY BERNARDINI is a 64 y.o. white male with end stage renal disease on peritoneal dialysis, hepatic cirrhosis, congestive heart failure, COPD, atrial fibrillation, depression, GERD, sleep apnea, and gout who was admitted after cardiac arrest prior to scheduled colonoscopy on 02/06/2020. Patient currently admitted to [facility] ICU requiring intubation, mechanical ventilation and vasopressors.   # ESRD on Peritoneal dialysis:  Patient received hemodialysis yesterday  -Will  plan for another  session of hemodialysis and PD  today  #Hyperkalemia Awaiting lab results   #Anemia with renal failure and GI bleed.   Lab Results  Component Value Date   HGB 7.8 (L) 02/21/2020    Hematology and Gastro enterology teams involved in the care -Receives Epogen 40,000 unit subcutaneous weekly as outpatient, last dose was on 02/11/2020  -Patient received  PRBC transfusion on 02/20/20  #Secondary Hyperparathyroidism with hyperphosphatemia.  PTH Lab Results  Component Value Date   PTH 222 (H) 12/27/2019   CALCIUM 6.7 (L) 02/20/2020   CAION 0.97 (L) 11/08/2019   PHOS 10.4 (H) 02/21/2020    Hyperphosphatemia, improving  Planning for hemodialysis and peritoneal dialysis today  # Acute respiratory failure Ventilator Dependent, FiO2 weaned down to 50 % Patient is getting treated with Unasyn for possible aspiration pneumonia     LOS: 7 Jameel Quant 10/21/202112:54 PM

## 2020-02-21 NOTE — Procedures (Signed)
Patient Name:Sudeep MEHMET SCALLY YIA:[id number] Epilepsy Attending:Bryann Gentz Barbra Sarks Referring Physician/Provider:Dr Kerney Elbe Date:02/21/2020 Duration:26.49 mins  Patient history:64 year old male s/p cardiac arrest x 10 minutes, now unresponsive on the ventilator with depressed brainstem reflexes. He is s/p TTM. EEG to evaluate for seizure  Level of alertness:comatose  AEDs during EEG study: valproic acid  Technical aspects: This EEG study was done with scalp electrodes positioned according to the 10-20 International system of electrode placement. Electrical activity was acquired at a sampling rate of 500Hz  and reviewed with a high frequency filter of 70Hz  and a low frequency filter of 1Hz . EEG data were recorded continuously and digitally stored.   Description:EEG showed generalized periodic epileptiform discharges with overriding fast activity at 0.25-0.5Hz  as well as generalized spikes at 1 Hz Continuous generalized 3 to 5 Hz theta-delta slowing was also noted.Hyperventilation and photic stimulation were not performed.   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  No seizures were seen during the study.  EEG appears similar to previous day.

## 2020-02-21 NOTE — Progress Notes (Signed)
eeg done °

## 2020-02-22 ENCOUNTER — Inpatient Hospital Stay: Payer: Commercial Managed Care - PPO

## 2020-02-22 ENCOUNTER — Other Ambulatory Visit: Payer: Self-pay

## 2020-02-22 DIAGNOSIS — Z7189 Other specified counseling: Secondary | ICD-10-CM | POA: Diagnosis not present

## 2020-02-22 DIAGNOSIS — Z515 Encounter for palliative care: Secondary | ICD-10-CM | POA: Diagnosis not present

## 2020-02-22 DIAGNOSIS — G931 Anoxic brain damage, not elsewhere classified: Secondary | ICD-10-CM | POA: Diagnosis not present

## 2020-02-22 DIAGNOSIS — I469 Cardiac arrest, cause unspecified: Secondary | ICD-10-CM | POA: Diagnosis not present

## 2020-02-22 LAB — PHOSPHORUS: Phosphorus: 11.1 mg/dL — ABNORMAL HIGH (ref 2.5–4.6)

## 2020-02-22 LAB — COMPREHENSIVE METABOLIC PANEL
ALT: 30 U/L (ref 0–44)
AST: 72 U/L — ABNORMAL HIGH (ref 15–41)
Albumin: 2.4 g/dL — ABNORMAL LOW (ref 3.5–5.0)
Alkaline Phosphatase: 111 U/L (ref 38–126)
Anion gap: 19 — ABNORMAL HIGH (ref 5–15)
BUN: 109 mg/dL — ABNORMAL HIGH (ref 8–23)
CO2: 23 mmol/L (ref 22–32)
Calcium: 6.9 mg/dL — ABNORMAL LOW (ref 8.9–10.3)
Chloride: 96 mmol/L — ABNORMAL LOW (ref 98–111)
Creatinine, Ser: 10.65 mg/dL — ABNORMAL HIGH (ref 0.61–1.24)
GFR, Estimated: 5 mL/min — ABNORMAL LOW (ref >60)
Glucose, Bld: 265 mg/dL — ABNORMAL HIGH (ref 70–99)
Potassium: 4.9 mmol/L (ref 3.5–5.1)
Sodium: 138 mmol/L (ref 135–145)
Total Bilirubin: 0.7 mg/dL (ref 0.3–1.2)
Total Protein: 6.3 g/dL — ABNORMAL LOW (ref 6.5–8.1)

## 2020-02-22 LAB — CBC WITH DIFFERENTIAL/PLATELET
Abs Immature Granulocytes: 0.41 10*3/uL — ABNORMAL HIGH (ref 0.00–0.07)
Basophils Absolute: 0 10*3/uL (ref 0.0–0.1)
Basophils Relative: 1 %
Eosinophils Absolute: 0 10*3/uL (ref 0.0–0.5)
Eosinophils Relative: 0 %
HCT: 26.3 % — ABNORMAL LOW (ref 39.0–52.0)
Hemoglobin: 8.1 g/dL — ABNORMAL LOW (ref 13.0–17.0)
Immature Granulocytes: 6 %
Lymphocytes Relative: 5 %
Lymphs Abs: 0.3 10*3/uL — ABNORMAL LOW (ref 0.7–4.0)
MCH: 28 pg (ref 26.0–34.0)
MCHC: 30.8 g/dL (ref 30.0–36.0)
MCV: 91 fL (ref 80.0–100.0)
Monocytes Absolute: 0.5 10*3/uL (ref 0.1–1.0)
Monocytes Relative: 8 %
Neutro Abs: 5.5 10*3/uL (ref 1.7–7.7)
Neutrophils Relative %: 80 %
Platelets: 85 10*3/uL — ABNORMAL LOW (ref 150–400)
RBC: 2.89 MIL/uL — ABNORMAL LOW (ref 4.22–5.81)
RDW: 22.9 % — ABNORMAL HIGH (ref 11.5–15.5)
Smear Review: NORMAL
WBC: 6.8 10*3/uL (ref 4.0–10.5)
nRBC: 0.6 % — ABNORMAL HIGH (ref 0.0–0.2)

## 2020-02-22 LAB — MAGNESIUM: Magnesium: 2.5 mg/dL — ABNORMAL HIGH (ref 1.7–2.4)

## 2020-02-22 LAB — GLUCOSE, CAPILLARY
Glucose-Capillary: 242 mg/dL — ABNORMAL HIGH (ref 70–99)
Glucose-Capillary: 191 mg/dL — ABNORMAL HIGH (ref 70–99)
Glucose-Capillary: 176 mg/dL — ABNORMAL HIGH (ref 70–99)
Glucose-Capillary: 146 mg/dL — ABNORMAL HIGH (ref 70–99)
Glucose-Capillary: 198 mg/dL — ABNORMAL HIGH (ref 70–99)

## 2020-02-22 LAB — TRIGLYCERIDES: Triglycerides: 464 mg/dL — ABNORMAL HIGH

## 2020-02-22 MED ORDER — STERILE WATER FOR INJECTION IJ SOLN
INTRAMUSCULAR | Status: AC
  Administered 2020-02-22: 20 mL
  Filled 2020-02-22: qty 20

## 2020-02-22 MED ORDER — DOCUSATE SODIUM 50 MG/5ML PO LIQD: 100.0000 mg | Freq: Two times a day (BID) | ORAL | Status: DC | PRN

## 2020-02-22 MED ORDER — LORAZEPAM 2 MG/ML IJ SOLN
INTRAMUSCULAR | Status: AC
  Administered 2020-02-22: 2 mg via INTRAVENOUS
  Filled 2020-02-22: qty 1

## 2020-02-22 MED ORDER — METOPROLOL TARTRATE 50 MG PO TABS
50.0000 mg | ORAL_TABLET | Freq: Two times a day (BID) | ORAL | Status: DC
  Administered 2020-02-22 – 2020-02-23 (×2): 50 mg
  Filled 2020-02-22 (×2): qty 1

## 2020-02-22 MED ORDER — LABETALOL HCL 5 MG/ML IV SOLN
INTRAVENOUS | Status: AC
  Administered 2020-02-22: 10 mg via INTRAVENOUS
  Filled 2020-02-22: qty 4

## 2020-02-22 MED ORDER — LABETALOL HCL 5 MG/ML IV SOLN: 10.0000 mg | Freq: Once | INTRAVENOUS | Status: AC

## 2020-02-22 MED ORDER — LORAZEPAM 2 MG/ML IJ SOLN: 2.0000 mg | Freq: Once | INTRAMUSCULAR | Status: AC

## 2020-02-22 MED ORDER — DIGOXIN 0.25 MG/ML IJ SOLN
0.2500 mg | Freq: Once | INTRAMUSCULAR | Status: AC
  Administered 2020-02-22: 0.25 mg via INTRAVENOUS
  Filled 2020-02-22: qty 2

## 2020-02-22 MED ORDER — METOPROLOL TARTRATE 50 MG PO TABS
50.0000 mg | ORAL_TABLET | Freq: Two times a day (BID) | ORAL | Status: DC
  Administered 2020-02-22: 50 mg via ORAL
  Filled 2020-02-22: qty 1

## 2020-02-22 MED ORDER — VECURONIUM BROMIDE 10 MG IV SOLR: 20.0000 mg | Freq: Once | INTRAVENOUS | Status: AC

## 2020-02-22 MED ORDER — VECURONIUM BROMIDE 10 MG IV SOLR
INTRAVENOUS | Status: AC
  Administered 2020-02-22: 20 mg via INTRAVENOUS
  Filled 2020-02-22: qty 20

## 2020-02-22 MED ORDER — ACETAMINOPHEN 160 MG/5ML PO SOLN
650.0000 mg | Freq: Four times a day (QID) | ORAL | Status: DC | PRN
  Administered 2020-02-22 – 2020-02-23 (×2): 650 mg
  Filled 2020-02-22 (×4): qty 20.3

## 2020-02-22 MED ORDER — ALBUMIN HUMAN 5 % IV SOLN
12.5000 g | Freq: Once | INTRAVENOUS | Status: AC
  Administered 2020-02-22: 12.5 g via INTRAVENOUS
  Filled 2020-02-22: qty 250

## 2020-02-22 NOTE — Progress Notes (Signed)
Pt remains intubated. 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 Remains on amiodarone po via tube bid with iv doxin for rvr due to pressure for afib. Not candidate for anticoagulation due to bleeding risk. Had episode of rvr this am. Will continue with amiodarone po for ow.   Dr. Nehemiah Massed is on call for cardiology this weekend.

## 2020-02-22 NOTE — Progress Notes (Signed)
eLink Physician-Brief Progress Note Patient Name: PHILIPPE GANG DOB: Aug 23, 1955 MRN: [id number]   Date of Service  02/22/2020  HPI/Events of Note  Hypotension and tachycardia.  eICU Interventions  Albumin 5 % 250 ml iv x 1.        Kerry Kass Aynslee Mulhall 02/22/2020, 5:12 AM

## 2020-02-22 NOTE — Progress Notes (Signed)
Dialysis nurse at bedside for HD treatment. V/S (193/96, 145, 16, 94% ventilator). Treatment started with Dr. [name] at bedside during this time and made aware of v/s at this time. Verbal order received to decrease BFR and DFR to 350 and 600, orders completed. At 11:06am patient noted to have a decrease in blood pressure 93/64 with a HR of 130, Dr. [name] made aware of these changes and ordered to decrease BFR to 200 and if not resolved in 4-5 minutes can stop therapy. Primary RN Stormy Card) made aware of patient needing suctioning with noticed excessive mucous from right nasal lobe and v/s (decreased blood pressure, continued increased HR). Russell at bedside providing care immediately. At 11:15am noted improvement with blood pressures at 130/75, Dr. [name] made aware and treatment continued. 11:28am patients blood pressure decreased to 90/58 HR 153, Dr. [name] made aware and ordered to stop treatment at this time. At 11:30 RN noticed blood pressure improving (BP 153/96, HR 152) at 11:36am RN spoke with Dr. [name] on the phone and informed of improved blood pressures with now blood pressure elevated at 212/96, HR 123. At 11:40 Blood pressure more stabilized (120/77, HR 154). Dr. [name] made aware and treatment to be continued.
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
## 2020-02-22 NOTE — Progress Notes (Signed)
Patient Name: Patrick Hurst Date of Encounter: 02/22/2020  Hospital Problem List     Active Problems:   Cardiac arrest [facility])   Pressure injury of skin    Patient Profile     64 y.o.malewith history ofparoxysmal atrial fibrillation, HFpEF with an ejection fraction of 55 to 60%, hypertension, thyroid disease, diabetes, exogenous obesity, thrombocytopenia, end-stage renal disease who was admitted after presenting for GI work-up where he suffered a PEA cardiac arrest on October 14 during the procedure. He had approximately 10 minutes of CPR and intubated and required echo ventilation... Echocardiogram postarrest was read as showingpreserved LV function with EF of 50 to 55% which is low normal. No regional wall motion abnormalities. Moderate LVH. He was felt to have mild aortic sclerosis with no significant stenosis. He was placed on code ice. It is of note that he had previous admission in April of this year with acute hypoxic respiratory failure as well as symptomatic anemia. Endoscopy team was present at the onset of the arrest stated the patient became bradycardiac followed by PEA. As hemoglobin was 6.1. He had sinus rhythm on the monitor. He had had his amiodarone and Eliquis held prior to the procedure. Troponin was mildly elevated and flat consistent with demand. Did not appear to have have had an acute coronary event. EKG at the time showed sinus rhythm with nonspecific ST-T wave changes. Head CT at the time of arrest showed no mass or hemorrhage with no acute infarct. Chest x-ray postintubation showed cardiomegaly with vascular congestion. Laboratory showed a potassium of 5.9 with a creatinine of 19.17. BNP was 116.9 patient remains intubated. He haspredominantly been in sinus rhythm although has had episodes of breakthrough A. fib with RVR.   Subjective   Patient continues to be intubated and sedated.  This a.m. the patient has had acute onset of what appears to be  atrial flutter with rapid ventricular rate at 150 bpm with 2 1 block.  The patient may have had this episode due to agitation, dialysis, continued multiorgan failure, and or other cardiovascular causes.  The patient does not appear to have had progression of poor oxygenation or congestive heart failure from this issue.  After dialysis the patient remained in rapid rate and it is appropriate for further treatment with reasonable blood pressure margin at this time.  There has been some discussion of palliative care and therefore will be more conservative with treatment of this particular flutter due to the fact that it does not appear to be causing any primary issue.  The patient does have amiodarone treatment for atrial fibrillation although amiodarone is not as effective with atrial flutter  Inpatient Medications    . sodium chloride   Intravenous Once  . amiodarone  200 mg Per Tube BID  . chlorhexidine gluconate (MEDLINE KIT)  15 mL Mouth Rinse BID  . Chlorhexidine Gluconate Cloth  6 each Topical Daily  . feeding supplement (NEPRO CARB STEADY)  1,000 mL Per Tube Q24H  . feeding supplement (PROSource TF)  90 mL Per Tube 5 X Daily  . fentaNYL (SUBLIMAZE) injection  100 mcg Intravenous Once  . gentamicin cream  1 application Topical Daily  . heparin  5,000 Units Subcutaneous Q8H  . insulin aspart  0-6 Units Subcutaneous Q4H  . ipratropium-albuterol  3 mL Nebulization Q4H  . mouth rinse  15 mL Mouth Rinse 10 times per day  . methylPREDNISolone (SOLU-MEDROL) injection  20 mg Intravenous Q24H  . metoprolol tartrate  50 mg  Per Tube BID  . multivitamin  1 tablet Per Tube QHS  . valproic acid  5 mg/kg Per Tube TID    Vital Signs    Vitals:   02/22/20 1330 02/22/20 1400 02/22/20 1500 02/22/20 1524  BP: 130/83     Pulse:  (!) 156 (!) 155   Resp:  (!) 24 (!) 27   Temp:      TempSrc:      SpO2:  95% 94% 94%  Weight:      Height:        Intake/Output Summary (Last 24 hours) at 02/22/2020  1608 Last data filed at 02/22/2020 1330 Gross per 24 hour  Intake 3275.71 ml  Output 539 ml  Net 2736.71 ml   Filed Weights   02/20/20 0248 02/21/20 0330 02/22/20 0336  Weight: 133.6 kg (!) 137.7 kg (!) 141.3 kg    Physical Exam    GEN: Critically ill male on ventilator in ICU HEENT: normal.  Neck: Supple, no JVD, carotid bruits, or masses. Cardiac: Irregular regular Respiratory: Breathing with ventilator lly. GI: Soft, nontender, nondistended, BS + x 4. MS: no deformity or atrophy. Skin: warm and dry, no rash. Neuro: Intubated and sedated  Labs    CBC Recent Labs    02/21/20 0333 02/22/20 0423  WBC 5.9 6.8  NEUTROABS 4.8 5.5  HGB 7.8* 8.1*  HCT 24.9* 26.3*  MCV 92.2 91.0  PLT 90* 85*   Basic Metabolic Panel Recent Labs    02/20/20 0400 02/20/20 0400 02/21/20 0333 02/22/20 0423  NA 135  --   --  138  K 5.8*  --   --  4.9  CL 91*  --   --  96*  CO2 22  --   --  23  GLUCOSE 242*  --   --  265*  BUN 80*  --   --  109*  CREATININE 12.23*  --   --  10.65*  CALCIUM 6.7*  --   --  6.9*  MG 2.2   < > 2.4 2.5*  PHOS 11.7*   < > 10.4* 11.1*   < > = values in this interval not displayed.   Liver Function Tests Recent Labs    02/22/20 0423  AST 72*  ALT 30  ALKPHOS 111  BILITOT 0.7  PROT 6.3*  ALBUMIN 2.4*   No results for input(s): LIPASE, AMYLASE in the last 72 hours. Cardiac Enzymes Recent Labs    02/20/20 0400  CKTOTAL 1,869*   BNP No results for input(s): BNP in the last 72 hours. D-Dimer No results for input(s): DDIMER in the last 72 hours. Hemoglobin A1C No results for input(s): HGBA1C in the last 72 hours. Fasting Lipid Panel Recent Labs    02/22/20 0423  TRIG 464*   Thyroid Function Tests No results for input(s): TSH, T4TOTAL, T3FREE, THYROIDAB in the last 72 hours.  Invalid input(s): FREET3  Telemetry    afib/nsr  ECG    nsr with no ischemia  Radiology    EEG  Result Date: 02/21/2020 Lora Havens, MD      02/21/2020  3:02 PM Patient Name:Patrick Hurst PNT:[id number] Epilepsy Attending:Priyanka Barbra Sarks Referring Physician/Provider:Dr Kerney Elbe Date:02/21/2020 Duration:26.49 mins  Patient history:64 year old male s/p cardiac arrest x 10 minutes, now unresponsive on the ventilator with depressed brainstem reflexes. He is s/p TTM. EEG to evaluate for seizure  Level of alertness:comatose  AEDs during EEG study: valproic acid  Technical aspects: This EEG study  was done with scalp electrodes positioned according to the 10-20 International system of electrode placement. Electrical activity was acquired at a sampling rate of '500Hz'  and reviewed with a high frequency filter of '70Hz'  and a low frequency filter of '1Hz' . EEG data were recorded continuously and digitally stored.  Description:EEG showed generalized periodic epileptiform discharges with overriding fast activity at 0.25-0.'5Hz'  as well as generalized spikes at 1 Hz Continuous generalized 3 to 5 Hz theta-delta slowing was also noted.Hyperventilation and photic stimulation were not performed.   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  No seizures were seen during the study.  EEG appears similar to previous day.    EEG  Result Date: 02/20/2020 Lora Havens, MD     02/20/2020  3:59 PM Patient Name: HOSAM MCFETRIDGE MRN: [id number] Epilepsy Attending: Lora Havens Referring Physician/Provider: Dr Kerney Elbe Date: 02/20/2020 Duration: 29.44 mins  Patient history: 64 year old male s/p cardiac arrest x 10 minutes, now unresponsive on the ventilator with depressed brainstem reflexes. He is s/p TTM. EEG to evaluate for seizure  Level of alertness:  comatose  AEDs  during EEG study:  Versed, valproic acid  Technical aspects: This EEG study was done with scalp electrodes positioned according to the 10-20 International system of electrode placement. Electrical activity was acquired at a sampling rate of '500Hz'  and reviewed with a high frequency filter of '70Hz'  and a low frequency filter of '1Hz' . EEG data were recorded continuously and digitally stored.  Description: EEG showed generalized periodic epileptiform discharges with overriding fast activity at 1-'2Hz'  as well as continuous generalized 3 to 5 Hz theta-delta slowing. Hyperventilation and photic stimulation were not performed.    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  No seizures were seen during the study.  EEG appears improved compared to previous day.  Dr Cheral Marker was notified.  Lora Havens   EEG  Result Date: 02/19/2020 Lora Havens, MD     02/19/2020  4:53 PM Patient Name: SHAHZAIN KIESTER MRN: [id number] Epilepsy Attending: Lora Havens Referring Physician/Provider: Dr Flora Lipps Date: 02/19/2020 Duration: 24.51 mins Patient history: 64 year old male s/p cardiac arrest x 10 minutes, now unresponsive on the ventilator with depressed brainstem reflexes. He is s/p TTM. EEG to evaluate for seizure Level of alertness:  comatose AEDs during EEG study: None Technical aspects: This EEG study was done with scalp electrodes positioned according to the 10-20 International system of electrode placement. Electrical activity was acquired at a sampling rate of '500Hz'  and reviewed with a high frequency filter of '70Hz'  and a low frequency filter of '1Hz' . EEG data were recorded continuously and digitally stored. Description: EEG showed generalized periodic  epileptiform discharges with overriding fast activity at 1-1.'5hz'  as well as generalized background suppression. Two seizures were noted at 1455 and 1504, lasting 45 seconds and 1.5 mins respectively. On video patient was noted to have sudden eye opening, upward gaze deviation and rhythmic arm twitching. Concomitant eeg showed generalized polyspikes.  Hyperventilation and photic stimulation were not performed.   ABNORMALITY - Myoclonic Seizure, generalized - Periodic epileptiform discharges with plus pattern,  generalized - Background suppression, generalized IMPRESSION: This study showed two myoclonic seizures as described above with generalized onset, at 1455 and 1504, lasting 45 seconds and 1.5 mins respectively. 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 Dr Cheral Marker was notified. Lora Havens   DG Chest 1 View  Result Date: 02/18/2020 CLINICAL DATA:  Oxygen desaturation EXAM: CHEST  1 VIEW COMPARISON:  Yesterday FINDINGS: The endotracheal tube tip is at the clavicular heads. The enteric tube tip is not visualized beyond the lower esophagus. Dialysis catheter on the right with tip at the upper right atrium. Cardiopericardial enlargement. Increased density at the bases, especially on the left. No visible effusion or pneumothorax. These results will be called to the ordering clinician or representative by the Radiologist Assistant, and communication documented in the PACS or [employer]. IMPRESSION: 1. The enteric tube tip appears to terminate at the lower esophagus, but certainty is limited by patient anatomy. Consider advancement and KUB. 2. Cardiomegaly with mild interval lower lobe opacification, atelectasis or infiltrate. Electronically Signed   By: Monte Fantasia M.D.   On: 02/18/2020 05:50   DG Abd 1 View  Result Date: 02/18/2020 CLINICAL DATA:  OG tube placement EXAM: ABDOMEN - 1 VIEW COMPARISON:  02/13/2020  FINDINGS: Enteric tube tip is in the left upper quadrant consistent with location in the upper stomach. There has been advancement since the previous study. Right central venous catheter with tip over the cavoatrial junction region. Visualized bowel are not abnormally distended. Vascular calcifications in the upper abdomen. Old left rib fractures. Consolidation and air bronchograms in the left lung base. IMPRESSION: Enteric tube tip is been advanced and now in satisfactory apparent position. Electronically Signed   By: Lucienne Capers M.D.   On: 02/18/2020 06:48   DG Abd 1 View  Result Date: 02/01/2020 CLINICAL DATA:  OG tube placement EXAM: ABDOMEN - 1 VIEW COMPARISON:  July 26, 2019 FINDINGS: The enteric tube projects over the gastric body. The visualized bowel gas pattern is nonspecific. IMPRESSION: The enteric tube projects over the gastric body. Electronically Signed   By: Constance Holster M.D.   On: 02/28/2020 17:55   CT HEAD WO CONTRAST  Result Date: 02/01/2020 CLINICAL DATA:  Status post cardiac arrest EXAM: CT HEAD WITHOUT CONTRAST TECHNIQUE: Contiguous axial images were obtained from the base of the skull through the vertex without intravenous contrast. COMPARISON:  March 13, 2012 head CT and brain MRI. FINDINGS: Brain: Ventricles and sulci are normal in size and configuration. There is no intracranial mass, hemorrhage, extra-axial fluid collection, or midline shift. There is preservation of gray-white differentiation without evident cerebral edema. Brain parenchyma appears unremarkable. No acute infarct evident. There is a mild area of deep sulcal asymmetry in the right temporal region compared to the left, stable and a presumed anatomic variant. Vascular: No hyperdense vessel. There is calcification in each carotid siphon region. Skull: The bony calvarium appears intact. Sinuses/Orbits: Visualized paranasal sinuses are clear. Orbits appear symmetric bilaterally. Other: Mastoid air cells  are clear. IMPRESSION: Preservation of gray-white differentiation without edema. No acute infarct. No mass or hemorrhage. Foci of arterial vascular calcification noted. Electronically Signed   By: Lowella Grip III M.D.   On: 02/08/2020 13:03   CT ANGIO CHEST PE W OR WO CONTRAST  Result Date: 02/18/2020 CLINICAL DATA:  Respiratory failure EXAM: CT ANGIOGRAPHY CHEST WITH CONTRAST TECHNIQUE: Multidetector CT imaging of the chest was performed using the standard protocol during bolus  administration of intravenous contrast. Multiplanar CT image reconstructions and MIPs were obtained to evaluate the vascular anatomy. CONTRAST:  13m OMNIPAQUE IOHEXOL 350 MG/ML SOLN COMPARISON:  11/19/2019 FINDINGS: Cardiovascular: Moderate coronary artery calcifications, most pronounced in the left anterior descending coronary artery. Aortic calcifications. No aneurysm. Mild cardiomegaly. Mediastinum/Nodes: No mediastinal, hilar, or axillary adenopathy. Endotracheal tube tip is in the midtrachea. Thyroid unremarkable. Lungs/Pleura: Lower lobe volume loss and consolidation bilaterally. This could reflect atelectasis, pneumonia, or aspiration. No effusions. Upper Abdomen: Subtle nodularity of the liver suggesting cirrhosis as seen on prior study. Prior cholecystectomy. Small amount of perihepatic ascites. Musculoskeletal: Chest wall soft tissues are unremarkable. No acute bony abnormality. Review of the MIP images confirms the above findings. IMPRESSION: No evidence of pulmonary embolus. Cardiomegaly, coronary artery disease. Volume loss and dense consolidation in the lower lobes which could reflect atelectasis, pneumonia or aspiration. Nodular contours of the liver suggest cirrhosis. Small amount of Peri hepatic ascites. Aortic Atherosclerosis (ICD10-I70.0). Electronically Signed   By: KRolm BaptiseM.D.   On: 02/18/2020 17:46   MR BRAIN WO CONTRAST  Result Date: 02/17/2020 CLINICAL DATA:  Neuro deficit.  Stroke suspected.  EXAM: MRI HEAD WITHOUT CONTRAST TECHNIQUE: Multiplanar, multiecho pulse sequences of the brain and surrounding structures were obtained without intravenous contrast. COMPARISON:  02/05/2020 head CT and prior.  03/13/2012 MRI head. FINDINGS: Brain: No diffusion-weighted signal abnormality. No intracranial hemorrhage. No midline shift, ventriculomegaly or extra-axial fluid collection. No mass lesion. Remote right basal ganglia insult. Vascular: Grossly preserved major intracranial flow voids. Skull and upper cervical spine: Normal marrow signal. Sinuses/Orbits: Sequela of bilateral lens replacement. Bilateral proptosis, unchanged. Mild to moderate pansinus mucosal thickening. Bilateral mastoid effusions. Other: None. IMPRESSION: No acute intracranial process. Remote right basal ganglia insult. Electronically Signed   By: CPrimitivo GauzeM.D.   On: 02/17/2020 15:02   DG Chest Port 1 View  Result Date: 02/22/2020 CLINICAL DATA:  Encounter for intubation EXAM: PORTABLE CHEST 1 VIEW COMPARISON:  02/20/2020 FINDINGS: Endotracheal tube approximately 11 cm above the carina. Recommend advancing 5 cm. NG tube enters the stomach with the tip not visualized. Right jugular central venous catheter tip in the cavoatrial junction. Improvement in bibasilar atelectasis. Mild vascular congestion without edema. Possible small pleural effusion bilaterally. IMPRESSION: Endotracheal tube 11 cm above the carina.  Recommend advancing 5 cm. Improved aeration in the lung bases.  Mild vascular congestion Electronically Signed   By: CFranchot GalloM.D.   On: 02/22/2020 10:54   DG Chest Port 1 View  Result Date: 02/20/2020 CLINICAL DATA:  Shortness of breath EXAM: PORTABLE CHEST 1 VIEW COMPARISON:  Two days ago FINDINGS: The endotracheal tube tip is at the clavicular heads. The enteric tube at least reaches the diaphragm. Dialysis catheter with tip at the upper cavoatrial junction. Increased hazy opacification of the bilateral lower  chest compared to radiograph. By recent chest CT there is bilateral lower lobe collapse. Cardiomegaly. No pneumothorax or clear pulmonary edema. IMPRESSION: 1. Unremarkable hardware positioning. 2. Persistence of bilateral lower lobe collapse seen on recent chest CT. Electronically Signed   By: JMonte FantasiaM.D.   On: 02/20/2020 05:22   DG Chest Port 1 View  Result Date: 02/17/2020 CLINICAL DATA:  Respiratory failure EXAM: PORTABLE CHEST 1 VIEW COMPARISON:  02/15/2020 FINDINGS: Unchanged support apparatus. Shallow lung inflation without focal airspace consolidation. Mild cardiomegaly. IMPRESSION: Shallow lung inflation without focal airspace consolidation. Electronically Signed   By: KUlyses JarredM.D.   On: 02/17/2020 06:21   DG Chest  Port 1 View  Result Date: 02/15/2020 CLINICAL DATA:  Respiratory failure. EXAM: PORTABLE CHEST 1 VIEW COMPARISON:  Chest x-ray 02/06/2020, CT chest 11/19/2019 FINDINGS: Endotracheal tube with tip approximately 8 cm above the carina. Enteric tube courses below diaphragm with tip and side port overlying the expected region the gastric lumen. PO contrast is noted partially opacifying the gastric lumen. Right chest wall dialysis catheter again noted with tip overlying the right atrium with tip near the inferior cavoatrial junction a (tip likely more cranially within the right atrium; however, due to low lung volumes tip is near the junction). The heart size and mediastinal contours are unchanged. Low lung volumes. Vague patchy airspace opacities. Increased interstitial markings. No pleural effusion. No pneumothorax. No acute osseous abnormality. IMPRESSION: 1. Enteric tube with tip terminating 8 cm above the carina. Consider advancing by 2 cm. 2. Low lung volumes with vague patchy airspace opacities that likely represent atelectasis. 3. Cardiomegaly with mild pulmonary edema. Electronically Signed   By: Iven Finn M.D.   On: 02/15/2020 02:28   DG Chest Port 1  View  Result Date: 02/16/2020 CLINICAL DATA:  Intubation EXAM: PORTABLE CHEST 1 VIEW COMPARISON:  12/27/2019 FINDINGS: There is a well-positioned tunneled dialysis catheter on the right. The endotracheal tube terminates above the carina and just below the thoracic inlet. The tube terminates approximately 8 cm above the carina. The enteric tube appears to extend below the left hemidiaphragm. The heart size is stable but enlarged. The lung volumes are low. There is vascular congestion with small bilateral pleural effusions and bibasilar atelectasis. There is no acute osseous abnormality. IMPRESSION: 1. Lines and tubes as above. 2. Low lung volumes with bibasilar atelectasis and small bilateral pleural effusions. 3. Cardiomegaly with vascular congestion. Electronically Signed   By: Constance Holster M.D.   On: 02/03/2020 17:54   EEG adult  Result Date: 02/12/2020 Alexis Goodell, MD     02/29/2020  3:29 PM ELECTROENCEPHALOGRAM REPORT Patient: JEHAN BONANO       Room #: IC06A EEG No. ID: [id number] Age: 64 y.o.        Sex: male Requesting Physician: Kasa Report Date:  02/05/2020       Interpreting Physician: Alexis Goodell History: TRAMAIN GERSHMAN is an 64 y.o. male with PEA arrest during colonoscopy Medications: Fentanyl, Propofol, Levophed, ASA, Conditions of Recording:  This is a 21 channel routine scalp EEG performed with bipolar and monopolar montages arranged in accordance to the international 10/20 system of electrode placement. One channel was dedicated to EKG recording. The patient is in the intubated and sedated state. Description:  The background activity is very low voltage and often obscured by low voltage artifact and electrocardiogram artifact.   At times some very slow polymorphic delta activity could be identified.  No epileptiform activity is noted.  The patient was stimulated during the recording with no activation of the background rhythm noted.  Hyperventilation and intermittent photic  stimulation were not performed. IMPRESSION: This electroencephalogram is characterized by very slow, low voltage background activity that is consistent with a medication effect, but can not rule out a severe diffuse cerebral disturbance as well, etiologically nonspecific.  Clinical correlation recommended.  No epileptiform activity is noted.  Alexis Goodell, MD Neurology 02/25/2020, 2:43 PM   ECHOCARDIOGRAM COMPLETE  Result Date: 02/06/2020    ECHOCARDIOGRAM REPORT   Patient Name:   TAMARIO HEAL Date of Exam: 02/22/2020 Medical Rec #:  [id number]      Height:  72.0 in Accession #:    [id number]     Weight:       289.2 lb Date of Birth:  22-Nov-1955      BSA:          2.492 m Patient Age:    26 years       BP:           121/46 mmHg Patient Gender: M              HR:           88 bpm. Exam Location:  [facility] Procedure: 2D Echo, Cardiac Doppler and Color Doppler Indications:     Cardiac Arrest 146.9  History:         Patient has prior history of Echocardiogram examinations, most                  recent 11/16/2019. CHF.  Sonographer:     Sherrie Sport RDCS (AE) Referring Phys:  [id number] BRITTON L [place] Diagnosing Phys: Ida Rogue MD  Sonographer Comments: Echo performed with patient supine and on artificial respirator. IMPRESSIONS  1. Left ventricular ejection fraction, by estimation, is 50 to 55%. The left ventricle has low normal function. The left ventricle has no regional wall motion abnormalities. There is moderate left ventricular hypertrophy. Indeterminate diastolic filling  due to E-A fusion.  2. The aortic valve was not well visualized. Aortic valve regurgitation is not visualized. Mild to moderate aortic valve sclerosis/calcification is present, without any evidence of aortic stenosis. FINDINGS  Left Ventricle: Left ventricular ejection fraction, by estimation, is 50 to 55%. The left ventricle has low normal function. The left ventricle has no regional wall motion abnormalities. The left  ventricular internal cavity size was normal in size. There is moderate left ventricular hypertrophy. Indeterminate diastolic filling due to E-A fusion. Right Ventricle: The right ventricular size is normal. No increase in right ventricular wall thickness. Right ventricular systolic function is normal. Tricuspid regurgitation signal is inadequate for assessing PA pressure. Left Atrium: Left atrial size was normal in size. Right Atrium: Right atrial size was normal in size. Pericardium: There is no evidence of pericardial effusion. Mitral Valve: The mitral valve is normal in structure. Mild to moderate mitral annular calcification. No evidence of mitral valve regurgitation. No evidence of mitral valve stenosis. Tricuspid Valve: The tricuspid valve is normal in structure. Tricuspid valve regurgitation is not demonstrated. No evidence of tricuspid stenosis. Aortic Valve: The aortic valve was not well visualized. Aortic valve regurgitation is not visualized. Mild to moderate aortic valve sclerosis/calcification is present, without any evidence of aortic stenosis. Aortic valve mean gradient measures 5.5 mmHg.  Aortic valve peak gradient measures 10.2 mmHg. Aortic valve area, by VTI measures 2.12 cm. Pulmonic Valve: The pulmonic valve was normal in structure. Pulmonic valve regurgitation is not visualized. No evidence of pulmonic stenosis. Aorta: The aortic root is normal in size and structure. Venous: The inferior vena cava is normal in size with greater than 50% respiratory variability, suggesting right atrial pressure of 3 mmHg. IAS/Shunts: No atrial level shunt detected by color flow Doppler.  LEFT VENTRICLE PLAX 2D LVIDd:         4.64 cm  Diastology LVIDs:         2.94 cm  LV e' medial:    7.29 cm/s LV PW:         1.74 cm  LV E/e' medial:  12.2 LV IVS:        1.94 cm  LV e' lateral:   4.46 cm/s LVOT diam:     2.30 cm  LV E/e' lateral: 19.9 LV SV:         58 LV SV Index:   23 LVOT Area:     4.15 cm  LEFT ATRIUM              Index       RIGHT ATRIUM           Index LA Vol (A2C):   71.8 ml 28.81 ml/m RA Area:     27.80 cm LA Vol (A4C):   47.2 ml 18.94 ml/m RA Volume:   99.70 ml  40.01 ml/m LA Biplane Vol: 61.2 ml 24.56 ml/m  AORTIC VALVE                    PULMONIC VALVE AV Area (Vmax):    2.03 cm     PV Vmax:        0.93 m/s AV Area (Vmean):   1.97 cm     PV Peak grad:   3.4 mmHg AV Area (VTI):     2.12 cm     RVOT Peak grad: 5 mmHg AV Vmax:           160.00 cm/s AV Vmean:          106.000 cm/s AV VTI:            0.272 m AV Peak Grad:      10.2 mmHg AV Mean Grad:      5.5 mmHg LVOT Vmax:         78.30 cm/s LVOT Vmean:        50.300 cm/s LVOT VTI:          0.139 m LVOT/AV VTI ratio: 0.51  AORTA Ao Root diam: 3.60 cm MITRAL VALVE               TRICUSPID VALVE MV Area (PHT): 2.48 cm    TR Peak grad:   14.4 mmHg MV Decel Time: 306 msec    TR Vmax:        190.00 cm/s MV E velocity: 88.80 cm/s MV A velocity: 47.50 cm/s  SHUNTS MV E/A ratio:  1.87        Systemic VTI:  0.14 m                            Systemic Diam: 2.30 cm Ida Rogue MD Electronically signed by Ida Rogue MD Signature Date/Time: 02/08/2020/4:14:37 PM    Final    ECHOCARDIOGRAM LIMITED  Result Date: 02/18/2020    ECHOCARDIOGRAM LIMITED REPORT   Patient Name:   TERUO STILLEY Date of Exam: 02/18/2020 Medical Rec #:  [id number]      Height:       72.0 in Accession #:    [id number]     Weight:       292.3 lb Date of Birth:  [birth year]      BSA:          2.504 m Patient Age:    35 years       BP:           Not listed in chart/Not listed in  chart mmHg Patient Gender: M              HR:           73 bpm. Exam Location:  [facility] Procedure: Cardiac Doppler, Color Doppler and Limited Echo Indications:     Cardiac Arrest 146.9  History:         Patient has prior history of Echocardiogram examinations, most                  recent 02/20/2020. Risk Factors:Sleep Apnea. Chronic diastolic                  CHF.   Sonographer:     Sherrie Sport RDCS (AE) Referring Phys:  [place] Diagnosing Phys: Bartholome Bill MD IMPRESSIONS  1. Left ventricular ejection fraction, by estimation, is 40 to 45%. The left ventricle has mild to moderately decreased function. The left ventricle demonstrates global hypokinesis. There is moderate left ventricular hypertrophy.  2. Right ventricular systolic function is normal. The right ventricular size is mildly enlarged.  3. Left atrial size was mildly dilated.  4. The mitral valve was not well visualized. Trivial mitral valve regurgitation.  5. The aortic valve was not well visualized. Aortic valve regurgitation is trivial. FINDINGS  Left Ventricle: Left ventricular ejection fraction, by estimation, is 40 to 45%. The left ventricle has mild to moderately decreased function. The left ventricle demonstrates global hypokinesis. There is moderate left ventricular hypertrophy. Right Ventricle: The right ventricular size is mildly enlarged. Right vetricular wall thickness was not well visualized. Right ventricular systolic function is normal. Left Atrium: Left atrial size was mildly dilated. Right Atrium: Right atrial size was normal in size. Pericardium: There is no evidence of pericardial effusion. Mitral Valve: The mitral valve was not well visualized. Trivial mitral valve regurgitation. Tricuspid Valve: The tricuspid valve is not well visualized. Tricuspid valve regurgitation is mild. Aortic Valve: The aortic valve was not well visualized. Aortic valve regurgitation is trivial. Pulmonic Valve: The pulmonic valve was not well visualized. Pulmonic valve regurgitation is trivial. IAS/Shunts: The interatrial septum was not assessed. LEFT VENTRICLE PLAX 2D LVIDd:         4.86 cm      Diastology LVIDs:         4.18 cm      LV e' medial:    5.66 cm/s LV PW:         1.81 cm      LV E/e' medial:  12.9 LV IVS:        2.12 cm      LV e' lateral:   11.90 cm/s LVOT diam:     2.30 cm      LV E/e' lateral:  6.1 LVOT Area:     4.15 cm  LV Volumes (MOD) LV vol d, MOD A2C: 162.0 ml LV vol d, MOD A4C: 178.0 ml LV vol s, MOD A2C: 84.5 ml LV vol s, MOD A4C: 111.0 ml LV SV MOD A2C:     77.5 ml LV SV MOD A4C:     178.0 ml LV SV MOD BP:      80.7 ml LEFT ATRIUM             Index       RIGHT ATRIUM           Index LA diam:        4.00 cm 1.60 cm/m  RA Area:     29.00 cm LA Vol (  A2C):   75.3 ml 30.08 ml/m RA Volume:   85.70 ml  34.23 ml/m LA Vol (A4C):   96.6 ml 38.59 ml/m LA Biplane Vol: 88.2 ml 35.23 ml/m                        PULMONIC VALVE AORTA                 PV Vmax:        0.59 m/s Ao Root diam: 3.30 cm PV Peak grad:   1.4 mmHg                       RVOT Peak grad: 3 mmHg  MITRAL VALVE               TRICUSPID VALVE MV Area (PHT): 2.52 cm    TR Peak grad:   14.6 mmHg MV Decel Time: 301 msec    TR Vmax:        191.00 cm/s MV E velocity: 72.80 cm/s MV A velocity: 49.00 cm/s  SHUNTS MV E/A ratio:  1.49        Systemic Diam: 2.30 cm Bartholome Bill MD Electronically signed by Bartholome Bill MD Signature Date/Time: 02/18/2020/1:21:04 PM    Final     Assessment & Plan    64 yo male with a significant history of COPD, OSA, T2DM, ESRD, CHF, AF presented to Endoscopy for an outpatient colonoscopy procedure.Duringthe procedure his heart rate began to drop and he PEA arrested requiring 10 minutes of CPR prior to achieving ROSC. He received epinephrine, atropine, calcium, sodium bicarb &D50. He was intubated during the code blue requiring mechanical ventilation.Patient underwent code ice protocol. He currently is intubated and on IV amiodarone.  1. Cardiac arrest-etiology of this is unclear. Appear to be a PEA arrest. Patient's high-sensitivity troponin was mildly elevated but relatively flat. Does not appear to suggest an acute coronary syndrome event. EKG also did not suggest acute coronary syndrome without ST elevation or to suggest this. Patient did not have pericardial effusion. No evidence of  pneumothorax. Electrolytes were stable. Patient received prompt ACLS care. Currently intubated and sedated.   2. Respiratory arrest-currently ventilator dependent. Appreciate excellent care by ICU team. Conitnued vent support. Weaning as tolerated.  Chest CT revealed no pulmonary embolus.   3. Abnormal serum troponin-as per above, does not appear to be an acute coronary event. Further assessment of this can be considered once he is recovered from this event. Not a candidate for invasive evaluation at present. Would continue with subcu heparin. Was symptomatically anemic and required transfusion. Would not use antiplatelet therapy or anticoagulation at present until this has been further assessed. Not a candidate for antiplatelet therapy from an ischemic [place].   4. Atrial fibrillation atrial flutter-has had paroxysmal A. fib as an outpatient. Was on Eliquis and amiodarone. Had stopped the Eliquis and amiodarone prior to his EGD/colonoscopy. Would remain off of Eliquis and other anticoagulation due to concern over GI bleed. Continue with amiodarone 200 mg twice daily via his NG tube.  Due to inadequate response of atrial flutter to amiodarone will use heart rate control with NG tube addition of beta-blocker   5. End-stage renal disease-appreciate nephrology input. Will likely require HD for some time. Had been on PD as an outpatient.  6. Anoxic encephalopathy.-Appreciate neurology input. EEG appears to suggest anoxic encephalopathy.    7. CHF-EF 50 to 55% by echo postarrest. ECHO this am revealed  EF of 40-45% but still global. No wall motiion abnromalitiesNo effusion.   8.  Further consideration of palliative care  Serafina Royals, MD, [facility]

## 2020-02-22 NOTE — Progress Notes (Signed)
Inpatient Diabetes Program Recommendations  AACE/ADA: New Consensus Statement on Inpatient Glycemic Control (2015)  Target Ranges:  Prepandial:   less than 140 mg/dL      Peak postprandial:   less than 180 mg/dL (1-2 hours)      Critically ill patients:  140 - 180 mg/dL   Results for AUDRIC, VENN (MRN [id number]) as of 02/22/2020 07:47  Ref. Range 02/20/2020 23:48 02/21/2020 03:23 02/21/2020 07:23 02/21/2020 11:22 02/21/2020 15:49 02/21/2020 19:12  Glucose-Capillary Latest Ref Range: 70 - 99 mg/dL 192 (H)  1 unit NOVOLOG  233 (H)  2 units NOVOLOG  207 (H)  2 units NOVOLOG  191 (H)  1 unit NOVOLOG  168 (H)  1 unit NOVOLOG  181 (H)  1 unit NOVOLOG    Results for DEKLAN, MINAR (MRN [id number]) as of 02/22/2020 07:47  Ref. Range 02/21/2020 23:04 02/22/2020 03:34 02/22/2020 07:17  Glucose-Capillary Latest Ref Range: 70 - 99 mg/dL 202 (H)  2 units NOVOLOG  242 (H)  2 units NOVOLOG  191 (H)  1 unit NOVOLOG      Home DM Meds: Humulin R U-500 Insulin 60 units BID  Current Orders: Novolog 0-6 units Q4 hours   MD- Note tube feeds running 30cc/hr.  If within goals of care, please consider adding Novolog Tube Feed Coverage:  Novolog 2 units Q4 hours  HOLD of tube feeds HELD for any reason    --Will follow patient during hospitalization--  Wyn Quaker RN, MSN, CDE Diabetes Coordinator Inpatient Glycemic Control Team Team Pager: [phone] (8a-5p)

## 2020-02-22 NOTE — Progress Notes (Signed)
[facility]  ROUNDING NOTE   Subjective:   Patient stays critically ill He is dependent on mechanical ventillation FiO2 increased to 60% today  Sedated with fentanyl and Precedex He is on Vasopressin today  Patient received peritoneal yesterday,HD held yeterday Starting another session of hemodialysis today    HEMODIALYSIS FLOWSHEET:  Blood Flow Rate (mL/min): 200 mL/min Arterial Pressure (mmHg): -80 mmHg Venous Pressure (mmHg): 80 mmHg Transmembrane Pressure (mmHg): 40 mmHg Ultrafiltration Rate (mL/min): 330 mL/min Dialysate Flow Rate (mL/min): 600 ml/min Conductivity: Machine : 14 Conductivity: Machine : 14 Dialysis Fluid Bolus: Normal Saline Bolus Amount (mL): 200 mL   Objective:  Vital signs in last 24 hours:  Temp:  [97.8 F (36.6 C)-99.5 F (37.5 C)] 97.8 F (36.6 C) (10/22 1030) Pulse Rate:  [40-150] 89 (10/22 0900) Resp:  [19-24] 24 (10/22 0900) BP: (90-212)/(55-96) 124/77 (10/22 1315) SpO2:  [89 %-97 %] 96 % (10/22 1100) Arterial Line BP: (102-169)/(55-74) 169/64 (10/22 0900) FiO2 (%):  [35 %-100 %] 50 % (10/22 1100) Weight:  [141.3 kg] 141.3 kg (10/22 0336)  Weight change: 3.6 kg Filed Weights   02/20/20 0248 02/21/20 0330 02/22/20 0336  Weight: 133.6 kg (!) 137.7 kg (!) 141.3 kg    Intake/Output: I/O last 3 completed shifts: In: 4766.7 [I.V.:2950; NG/GT:1467.5; IV Piggyback:349.2] Out: 200 [Stool:200]   Intake/Output this shift:  No intake/output data recorded.  Physical Exam: General:  Critically ill ,Intubated  Head:  Normocephalic, atraumatic, +ETT, +OGT  Eyes:  Sclerae and conjunctivae clear  Lungs:   Lungs with wheezes +,on mechanical ventillation  Heart:  S1S2,no rubs or gallops,HR in 100's  Abdomen:   obese  Extremities: Trace  peripheral edema.  Neurologic:  sedated  Skin: No acute lesions or rashes noted  Access: PD catheter, RIJ permcath, and left radiocephalic AVF +bruit     Basic Metabolic Panel: Recent Labs   Lab 02/18/20 0351 02/18/20 0351 02/18/20 1454 02/18/20 2300 02/18/20 2300 02/19/20 0450 02/20/20 0400 02/21/20 0333 02/22/20 0423  NA 134*  --   --  136  --  135 135  --  138  K 5.6*   < > 5.8* 6.0*  --  5.7* 5.8*  --  4.9  CL 88*  --   --  92*  --  88* 91*  --  96*  CO2 20*  --   --  19*  --  20* 22  --  23  GLUCOSE 195*  --   --  305*  --  305* 242*  --  265*  BUN 68*  --   --  85*  --  88* 80*  --  109*  CREATININE 15.33*  --   --  15.29*  --  15.72* 12.23*  --  10.65*  CALCIUM 6.9*   < >  --  6.7*   < > 6.8* 6.7*  --  6.9*  MG 2.3  --   --   --   --  2.4 2.2 2.4 2.5*  PHOS 12.7*  --   --   --   --  14.1* 11.7* 10.4* 11.1*   < > = values in this interval not displayed.    Liver Function Tests: Recent Labs  Lab 02/15/20 1409 02/18/20 2300 02/22/20 0423  AST  --  31 72*  ALT  --  15 30  ALKPHOS  --  96 111  BILITOT  --  1.1 0.7  PROT  --  6.4* 6.3*  ALBUMIN 2.9* 2.6* 2.4*  No results for input(s): LIPASE, AMYLASE in the last 168 hours. Recent Labs  Lab 02/19/20 1715  AMMONIA 29    CBC: Recent Labs  Lab 02/18/20 0351 02/19/20 0450 02/20/20 0400 02/21/20 0333 02/22/20 0423  WBC 6.9 4.7 4.9 5.9 6.8  NEUTROABS 5.7 4.0 4.0 4.8 5.5  HGB 7.9* 7.1* 6.3* 7.8* 8.1*  HCT 23.5* 20.9* 20.4* 24.9* 26.3*  MCV 93.3 93.3 97.1 92.2 91.0  PLT 84* 94* 91* 90* 85*    Cardiac Enzymes: Recent Labs  Lab 02/20/20 0400  CKTOTAL 1,869*    BNP: Invalid input(s): POCBNP  CBG: Recent Labs  Lab 02/21/20 1912 02/21/20 2304 02/22/20 0334 02/22/20 0717 02/22/20 1111  GLUCAP 181* 202* 242* 191* 176*    Microbiology: Results for orders placed or performed during the hospital encounter of 02/25/2020  Culture, blood (routine x 2)     Status: None   Collection Time: 02/06/2020 12:29 PM   Specimen: BLOOD  Result Value Ref Range Status   Specimen Description BLOOD RIGHT ANTECUBITAL  Final   Special Requests   Final    BOTTLES DRAWN AEROBIC AND ANAEROBIC Blood Culture  adequate volume   Culture   Final    NO GROWTH 5 DAYS Performed at [facility], [address] [place], [place] [address]    Report Status 02/19/2020 FINAL  Final  Culture, blood (routine x 2)     Status: None   Collection Time: 02/13/2020 12:30 PM   Specimen: BLOOD  Result Value Ref Range Status   Specimen Description BLOOD BLOOD RIGHT HAND  Final   Special Requests   Final    BOTTLES DRAWN AEROBIC AND ANAEROBIC Blood Culture adequate volume   Culture   Final    NO GROWTH 5 DAYS Performed at [facility], [address] [place], [place] [address]    Report Status 02/19/2020 FINAL  Final  MRSA PCR Screening     Status: None   Collection Time: 02/09/2020 12:48 PM   Specimen: Nasal Mucosa; Nasopharyngeal  Result Value Ref Range Status   MRSA by PCR NEGATIVE NEGATIVE Final    Comment:        The GeneXpert MRSA Assay (FDA approved for NASAL specimens only), is one component of a comprehensive MRSA colonization surveillance program. It is not intended to diagnose MRSA infection nor to guide or monitor treatment for MRSA infections. Performed at [facility], [address] [place], [place] [address]   Culture, respiratory (non-expectorated)     Status: None   Collection Time: 02/18/20  2:11 PM   Specimen: Tracheal Aspirate; Respiratory  Result Value Ref Range Status   Specimen Description   Final    TRACHEAL ASPIRATE Performed at [facility], [address], [place], [place] [address]    Special Requests   Final    NONE Performed at [facility], [address] [place], [place] [address]    Gram Stain   Final    ABUNDANT WBC PRESENT, PREDOMINANTLY PMN ABUNDANT GRAM POSITIVE COCCI FEW YEAST    Culture   Final    FEW Normal respiratory flora-no Staph aureus or Pseudomonas seen Performed at [place] [facility], [address] [place],  [address]    Report Status 02/21/2020 FINAL  Final    Coagulation  Studies: No results for input(s): LABPROT, INR in the last 72 hours.  Urinalysis: No results for input(s): COLORURINE, LABSPEC, PHURINE, GLUCOSEU, HGBUR, BILIRUBINUR, KETONESUR, PROTEINUR, UROBILINOGEN, NITRITE, LEUKOCYTESUR in the last  72 hours.  Invalid input(s): APPERANCEUR    Imaging: EEG  Result Date: 02/21/2020 Lora Havens, MD     02/21/2020  3:02 PM Patient Name:Patrick Hurst WJX:[id number] Epilepsy Attending:Priyanka Barbra Sarks Referring Physician/Provider:Dr Kerney Elbe Date:02/21/2020 Duration:26.49 mins  Patient history:64 year old male s/p cardiac arrest x 10 minutes, now unresponsive on the ventilator with depressed brainstem reflexes. He is s/p TTM. EEG to evaluate for seizure  Level of alertness:comatose  AEDs during EEG study: valproic acid  Technical aspects: This EEG study was done with scalp electrodes positioned according to the 10-20 International system of electrode placement. Electrical activity was acquired at a sampling rate of '500Hz'  and reviewed with a high frequency filter of '70Hz'  and a low frequency filter of '1Hz' . EEG data were recorded continuously and digitally stored.  Description:EEG showed generalized periodic epileptiform discharges with overriding fast activity at 0.25-0.'5Hz'  as well as generalized spikes at 1 Hz Continuous generalized 3 to 5 Hz theta-delta slowing was also noted.Hyperventilation and photic stimulation were not performed.   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  No seizures were seen during the study.  EEG appears similar to previous day.    EEG  Result Date: 02/20/2020 Lora Havens, MD     02/20/2020  3:59 PM Patient Name: Patrick Hurst  MRN: [id number] Epilepsy Attending: Lora Havens Referring Physician/Provider: Dr Kerney Elbe Date: 02/20/2020 Duration: 29.44 mins  Patient history: 64 year old male s/p cardiac arrest x 10 minutes, now unresponsive on the ventilator with depressed brainstem reflexes. He is s/p TTM. EEG to evaluate for seizure  Level of alertness:  comatose  AEDs during EEG study:  Versed, valproic acid  Technical aspects: This EEG study was done with scalp electrodes positioned according to the 10-20 International system of electrode placement. Electrical activity was acquired at a sampling rate of '500Hz'  and reviewed with a high frequency filter of '70Hz'  and a low frequency filter of '1Hz' . EEG data were recorded continuously and digitally stored.  Description: EEG showed generalized periodic epileptiform discharges with overriding fast activity at 1-'2Hz'  as well as continuous generalized 3 to 5 Hz theta-delta slowing. Hyperventilation and photic stimulation were not performed.    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  No seizures were seen during the study.  EEG appears improved compared to previous day.  Dr Cheral Marker was notified.  Lora Havens   DG Chest Port 1 View  Result Date: 02/22/2020 CLINICAL DATA:  Encounter for intubation EXAM: PORTABLE CHEST 1 VIEW COMPARISON:  02/20/2020 FINDINGS: Endotracheal tube approximately 11 cm above the carina. Recommend advancing 5 cm. NG tube enters the stomach with the tip not visualized. Right jugular central venous catheter tip in the cavoatrial junction. Improvement in bibasilar atelectasis. Mild vascular congestion without edema. Possible small pleural effusion bilaterally. IMPRESSION: Endotracheal tube 11 cm  above the carina.  Recommend advancing 5 cm. Improved aeration in the lung bases.  Mild vascular congestion Electronically Signed   By: Franchot Gallo M.D.   On: 02/22/2020 10:54     Medications:   . sodium chloride    .  sodium chloride 10 mL/hr at 02/15/20 0600  . ampicillin-sulbactam (UNASYN) IV Stopped (02/21/20 1655)  . dexmedetomidine (PRECEDEX) IV infusion 0.8 mcg/kg/hr (02/22/20 1102)  . dialysis solution 1.5% low-MG/low-CA    . dialysis solution 2.5% low-MG/low-CA    . famotidine (PEPCID) IV Stopped (02/21/20 1609)  . fentaNYL infusion INTRAVENOUS 250 mcg/hr (02/22/20 0400)  . norepinephrine (LEVOPHED) Adult infusion 5 mcg/min (02/22/20 1111)  . vasopressin 0.03 Units/min (02/22/20 0617)   . sodium chloride   Intravenous Once  . amiodarone  200 mg Per Tube BID  . chlorhexidine gluconate (MEDLINE KIT)  15 mL Mouth Rinse BID  . Chlorhexidine Gluconate Cloth  6 each Topical Daily  . feeding supplement (NEPRO CARB STEADY)  1,000 mL Per Tube Q24H  . feeding supplement (PROSource TF)  90 mL Per Tube 5 X Daily  . fentaNYL (SUBLIMAZE) injection  100 mcg Intravenous Once  . gentamicin cream  1 application Topical Daily  . heparin  5,000 Units Subcutaneous Q8H  . insulin aspart  0-6 Units Subcutaneous Q4H  . ipratropium-albuterol  3 mL Nebulization Q4H  . mouth rinse  15 mL Mouth Rinse 10 times per day  . methylPREDNISolone (SOLU-MEDROL) injection  20 mg Intravenous Q24H  . multivitamin  1 tablet Per Tube QHS  . valproic acid  5 mg/kg Per Tube TID   docusate sodium, fentaNYL, hydrALAZINE, midazolam, ondansetron (ZOFRAN) IV, polyethylene glycol, vecuronium  Assessment/ Plan:  Patrick Hurst is a 64 y.o. white male with end stage renal disease on peritoneal dialysis, hepatic cirrhosis, congestive heart failure, COPD, atrial fibrillation, depression, GERD, sleep apnea, and gout who was admitted after cardiac arrest prior to scheduled colonoscopy on 02/02/2020. Patient currently  admitted to [facility] ICU requiring intubation, mechanical ventilation and vasopressors.   # ESRD on Peritoneal dialysis:  Patient received peritoneal dialysis yesterday  -Will plan for hemodialysis today  #Hyperkalemia Potassium 4.9  Will continue monitoring  #Anemia with renal failure and GI bleed.   Lab Results  Component Value Date   HGB 8.1 (L) 02/22/2020    Hematology and Gastro enterology teams involved in the care -Receives Epogen 40,000 unit subcutaneous weekly as outpatient, last dose was on 02/11/2020  -Patient received  PRBC transfusion on 02/20/20  #Secondary Hyperparathyroidism with hyperphosphatemia.  PTH Lab Results  Component Value Date   PTH 222 (H) 12/27/2019   CALCIUM 6.9 (L) 02/22/2020   CAION 0.97 (L) 11/08/2019   PHOS 11.1 (H) 02/22/2020    Severe hyperphosphatemia  Planning for hemodialysis today  # Acute respiratory failure Stays ventilator dependent, FiO2 at 60% Patient is getting treated with Unasyn for possible aspiration pneumonia Awaiting palliative care consult    LOS: 8 Patrick Hurst 10/22/20211:35 PM

## 2020-02-22 NOTE — Progress Notes (Signed)
CRITICAL CARE NOTE 64 yo male presented for outpatient colonoscopy PEA cardiac arrested during the procedure, s/p 10 minutes CPR, intubated requiring mechanical ventilation with Targeted Temperature Management admitted to the ICU.  10/14Code Blue in Endo procedure, intubated, admitted to ICU for TTM 02/15/20- patient evaluated at bedside. Spoke to wife at length and reviewed various testing and diagnostics as well as therapy received and short term care plan. Levophed at 80mg/kg/min. S/p PD therapy this am. Patient with atelectasis on CXR this am, weaning FiO2 on MV with plan for SBT in am. Discussed care plan with renal team - Dr KJuleen Chinaconsidering HD.  02/16/20-patient did not pass breathing trial today, had family meeting with son and wife at bedside with RN present throughout. Plan to re-attempt SBT in AM.  02/17/20- patient had increased O2 requirement overnight and is more edematous with anasarca on exam this morning. I met with family and explained patient with numerous serious comorbid conditions and overall very poor prognosis long term. He was seen by cardiology and nephrology today. Awakening trial with no appreciable improvement in GCS, today plan to repeat SBT if FiO2 can be weaned to <40% and perform MRI brain w/wo to evaluate for ischemic changes post PEA arrest. 10/18signs of anoxic brain injury 10/19+seizures on EEG 10/20 no seizureson EEG but svere brain damage basedon exam, multiorgan failure 10/21 failed weaning trials 10/22 severe brain damage, severe resp failure  CC  follow up respiratory failure  SUBJECTIVE Patient remains critically ill Prognosis is guarded   BP (!) 145/55 (BP Location: Other (Comment)) Comment (BP Location): ART Line  Pulse (!) 104   Temp 99.1 F (37.3 C) (Axillary)   Resp (!) 24   Ht 6' (1.829 m)   Wt (!) 141.3 kg   SpO2 96%   BMI 42.25 kg/m    I/O last 3 completed shifts: In: 4766.7 [I.V.:2950; NG/GT:1467.5; IV  Piggyback:349.2] Out: 200 [Stool:200] No intake/output data recorded.  SpO2: 96 % FiO2 (%): (S) 70 %  Estimated body mass index is 42.25 kg/m as calculated from the following:   Height as of this encounter: 6' (1.829 m).   Weight as of this encounter: 141.3 kg.  SIGNIFICANT EVENTS   REVIEW OF SYSTEMS  PATIENT IS UNABLE TO PROVIDE COMPLETE REVIEW OF SYSTEMS DUE TO SEVERE CRITICAL ILLNESS        PHYSICAL EXAMINATION:  GENERAL:critically ill appearing, +resp distress HEAD: Normocephalic, atraumatic.  EYES: Pupils equal, round, reactive to light.  No scleral icterus.  MOUTH: Moist mucosal membrane. NECK: Supple.  PULMONARY: +rhonchi, +wheezing CARDIOVASCULAR: S1 and S2. Regular rate and rhythm. No murmurs, rubs, or gallops.  GASTROINTESTINAL: Soft, nontender, -distended.  Positive bowel sounds.   MUSCULOSKELETAL: No swelling, clubbing, or edema.  NEUROLOGIC: obtunded, GCS<8 SKIN:intact,warm,dry  MEDICATIONS: I have reviewed all medications and confirmed regimen as documented   CULTURE RESULTS   Recent Results (from the past 240 hour(s))  SARS CORONAVIRUS 2 (TAT 6-24 HRS) Nasopharyngeal Nasopharyngeal Swab     Status: None   Collection Time: 02/12/20 11:16 AM   Specimen: Nasopharyngeal Swab  Result Value Ref Range Status   SARS Coronavirus 2 NEGATIVE NEGATIVE Final    Comment: (NOTE) SARS-CoV-2 target nucleic acids are NOT DETECTED.  The SARS-CoV-2 RNA is generally detectable in upper and lower respiratory specimens during the acute phase of infection. Negative results do not preclude SARS-CoV-2 infection, do not rule out co-infections with other pathogens, and should not be used as the sole basis for treatment or other patient management  decisions. Negative results must be combined with clinical observations, patient history, and epidemiological information. The expected result is Negative.  Fact Sheet for  Patients: SugarRoll.be  Fact Sheet for Healthcare Providers: https://www.woods-mathews.com/  This test is not yet approved or cleared by the [place] FDA and  has been authorized for detection and/or diagnosis of SARS-CoV-2 by FDA under an Emergency Use Authorization (EUA). This EUA will remain  in effect (meaning this test can be used) for the duration of the COVID-19 declaration under Se ction 564(b)(1) of the Act, 21 U.S.C. section 360bbb-3(b)(1), unless the authorization is terminated or revoked sooner.  Performed at [facility], [place] [address], [place], [place] [address]   Culture, blood (routine x 2)     Status: None   Collection Time: 02/28/2020 12:29 PM   Specimen: BLOOD  Result Value Ref Range Status   Specimen Description BLOOD RIGHT ANTECUBITAL  Final   Special Requests   Final    BOTTLES DRAWN AEROBIC AND ANAEROBIC Blood Culture adequate volume   Culture   Final    NO GROWTH 5 DAYS Performed at [facility], [address] [place], [place] [address]    Report Status 02/19/2020 FINAL  Final  Culture, blood (routine x 2)     Status: None   Collection Time: 02/05/2020 12:30 PM   Specimen: BLOOD  Result Value Ref Range Status   Specimen Description BLOOD BLOOD RIGHT HAND  Final   Special Requests   Final    BOTTLES DRAWN AEROBIC AND ANAEROBIC Blood Culture adequate volume   Culture   Final    NO GROWTH 5 DAYS Performed at [facility], [address] [place], [place] [address]    Report Status 02/19/2020 FINAL  Final  MRSA PCR Screening     Status: None   Collection Time: 02/17/2020 12:48 PM   Specimen: Nasal Mucosa; Nasopharyngeal  Result Value Ref Range Status   MRSA by PCR NEGATIVE NEGATIVE Final    Comment:        The GeneXpert MRSA Assay (FDA approved for NASAL specimens only), is one component of a comprehensive MRSA colonization surveillance program. It is not intended to diagnose  MRSA infection nor to guide or monitor treatment for MRSA infections. Performed at [facility], [address] [place], [place] [address]   Culture, respiratory (non-expectorated)     Status: None   Collection Time: 02/18/20  2:11 PM   Specimen: Tracheal Aspirate; Respiratory  Result Value Ref Range Status   Specimen Description   Final    TRACHEAL ASPIRATE Performed at [facility], [address], [place], [place] [address]    Special Requests   Final    NONE Performed at [facility], [address] [place], [place] [address]    Gram Stain   Final    ABUNDANT WBC PRESENT, PREDOMINANTLY PMN ABUNDANT GRAM POSITIVE COCCI FEW YEAST    Culture   Final    FEW Normal respiratory flora-no Staph aureus or Pseudomonas seen Performed at [facility], [address] [place],  [address]    Report Status 02/21/2020 FINAL  Final          IMAGING    EEG  Result Date: 02/21/2020 Lora Havens, MD     02/21/2020  3:02 PM Patient Name:Keigan Carlean Jews [place] Lathe KPT:[id number] Epilepsy Attending:Priyanka Barbra Sarks Referring Physician/Provider:Dr Kerney Elbe Date:02/21/2020 Duration:26.49 mins  Patient history:64 year old male s/p cardiac arrest x 10  minutes, now unresponsive on the ventilator with depressed brainstem reflexes. He is s/p TTM. EEG to evaluate for seizure  Level of alertness:comatose  AEDs during EEG study: valproic acid  Technical aspects: This EEG study was done with scalp electrodes positioned according to the 10-20 International system of electrode placement. Electrical activity was acquired at a sampling rate of 500Hz and reviewed with a high frequency filter of 70Hz and a low frequency filter of 1Hz. EEG data were recorded continuously and digitally stored.  Description:EEG showed generalized periodic epileptiform discharges with overriding fast activity at 0.25-0.5Hz as well as generalized spikes at 1 Hz Continuous  generalized 3 to 5 Hz theta-delta slowing was also noted.Hyperventilation and photic stimulation were not performed.   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  No seizures were seen during the study.  EEG appears similar to previous day.      Nutrition Status: Nutrition Problem: Inadequate oral intake Etiology: inability to eat (pt sedated and ventilated) Signs/Symptoms: NPO status       Indwelling Urinary Catheter continued, requirement due to   Reason to continue Indwelling Urinary Catheter strict Intake/Output monitoring for hemodynamic instability   Central Line/ continued, requirement due to  Reason to continue [place] of central venous pressure or other hemodynamic parameters and poor IV access   Ventilator continued, requirement due to severe respiratory failure   Ventilator Sedation RASS 0 to -2      ASSESSMENT AND PLAN SYNOPSIS  ACUTE SEVERE CARDIAC ARREST DUE TO ACUTE ISCHEMIC CARDIOMYOPATHY Acute and severe hypoxic resp failure, complicated by aspiration pneumonia withFINDINGS STILLCONCERNING FOR ANOXIC BRAIN INJURY WITH MULTIORGAN FAILURE, +SEZIURES  Severe ACUTE Hypoxic and Hypercapnic Respiratory Failure -continue Full MV support -continue Bronchodilator Therapy -Wean Fio2 and PEEP as tolerated -VAP/VENT bundle implementation Severe brain damage  Will need TRACH for survival  ACUTE SYSTOLIC CARDIAC FAILURE- EF 45% Vent support   Morbid obesity, possible OSA.   Will certainly impact respiratory mechanics, ventilator weaning Suspect will need to consider additional PEEP  END STAGE KIDNEY INJURY/Renal Failure -continue Foley Catheter-assess need -Avoid  nephrotoxic agents -Follow urine output, BMP -Ensure adequate renal perfusion, optimize oxygenation -Renal dose medications PD/HD as needed    NEUROLOGY Acute toxic metabolic encephalopathy Wean sedation Follow up neuro recs Signs of severe brain damage  CARDIAC ICU monitoring  ID -continue IV abx as prescibed -follow up cultures  GI GI PROPHYLAXIS as indicated  DIET-->TF's as tolerated Constipation protocol as indicated  ENDO - will use ICU hypoglycemic\Hyperglycemia protocol if indicated     ELECTROLYTES -follow labs as needed -replace as needed -pharmacy consultation and following   DVT/GI PRX ordered and assessed TRANSFUSIONS AS NEEDED MONITOR FSBS I Assessed the need for Labs I Assessed the need for Foley I Assessed the need for Central Venous Line Family Discussion when available I Assessed the need for Mobilization I made an Assessment of medications to be adjusted accordingly Safety Risk assessment completed   CASE DISCUSSED IN MULTIDISCIPLINARY ROUNDS WITH ICU TEAM  Critical Care Time devoted to patient care services described in this note is 75 minutes.   Overall, patient is critically ill, prognosis is guarded.  Patient with Multiorgan failure and at high risk for cardiac arrest and death.   Recommend DNR status and Comfort care measures  Corrin Parker,  M.D.  [facility]  Medical Director [place] Director [facility] Cardio-Pulmonary Department

## 2020-02-22 NOTE — Progress Notes (Signed)
Pt HR elevated and sustained at 150, MD notified.  BP originally sustaining SBP in the 190s, but started to decline. Administered labetalol per MD verbal order, HR decreased to low 100s, BP stabilized with SBP in the 120s.

## 2020-02-22 NOTE — Progress Notes (Signed)
Subjective: Continues to be on IV sedation with Precedex and fentanyl. Eyes will open with stimulation, but does not track or fixate.   Objective: Current vital signs: BP 135/85 Comment (BP Location): arterial blood pressure  Pulse 89   Temp 99.1 F (37.3 C) (Axillary)   Resp (!) 24   Ht 6' (1.829 m)   Wt (!) 141.3 kg   SpO2 96%   BMI 42.25 kg/m  Vital signs in last 24 hours: Temp:  [98.2 F (36.8 C)-99.5 F (37.5 C)] 99.1 F (37.3 C) (10/22 0400) Pulse Rate:  [40-150] 89 (10/22 0900) Resp:  [19-24] 24 (10/22 0900) BP: (90-193)/(55-96) 135/85 (10/22 1200) SpO2:  [89 %-97 %] 96 % (10/22 1100) Arterial Line BP: (102-169)/(55-74) 169/64 (10/22 0900) FiO2 (%):  [35 %-100 %] 50 % (10/22 1100) Weight:  [141.3 kg] 141.3 kg (10/22 0336)  Intake/Output from previous day: 10/21 0701 - 10/22 0700 In: 3913.4 [I.V.:2246.8; WJ/XB:1478.2; IV Piggyback:199.2] Out: 200 [Stool:200] Intake/Output this shift: No intake/output data recorded. Nutritional status:  Diet Order            Diet NPO time specified  Diet effective now                 Physical Exam General: Morbidly obese HEENT- [place]/AT.  Lungs-Intubated Extremities-Chronic pigmentary changes to knees and distal BLE noted.  Neurological Examination:  Exam performed while on Precedex and fentanyl.  Mental Status:Eyes initially closed, the patient awakens to sternal rub to an eyes-open unresponsive state. Does not alert to voice, track visually, or move limbs to stimulation. No attempts to communicate.  Cranial Nerves: NF:AOZHYQ 2 mm and sluggishly reactive. No blink to threat.  III,IV, VI:No doll's eye reflex present.Eyes are conjugate and at the midline. No nystagmus.  V,VII:Blinks to eyelid stimulation. VIII:No response to voice or loud clap IX,X:Intubated MV:HQIONG to assess EXB:[id number] Motor/Sensory: Flaccid tone x 4.  No posturing or myoclonus noted.  No movement to any stimuli.  Deep  Tendon Reflexes:0 bilateral brachioradialis, biceps and patellae. Toes mute bilaterally. Cerebellar/Gait:Unable to assess.  Lab Results: Results for orders placed or performed during the hospital encounter of 02/13/2020 (from the past 48 hour(s))  Glucose, capillary     Status: Abnormal   Collection Time: 02/20/20  4:10 PM  Result Value Ref Range   Glucose-Capillary 182 (H) 70 - 99 mg/dL    Comment: Glucose reference range applies only to samples taken after fasting for at least 8 hours.  Glucose, capillary     Status: Abnormal   Collection Time: 02/20/20  7:15 PM  Result Value Ref Range   Glucose-Capillary 195 (H) 70 - 99 mg/dL    Comment: Glucose reference range applies only to samples taken after fasting for at least 8 hours.  Glucose, capillary     Status: Abnormal   Collection Time: 02/20/20 11:48 PM  Result Value Ref Range   Glucose-Capillary 192 (H) 70 - 99 mg/dL    Comment: Glucose reference range applies only to samples taken after fasting for at least 8 hours.  Glucose, capillary     Status: Abnormal   Collection Time: 02/21/20  3:23 AM  Result Value Ref Range   Glucose-Capillary 233 (H) 70 - 99 mg/dL    Comment: Glucose reference range applies only to samples taken after fasting for at least 8 hours.  CBC with Differential/Platelet     Status: Abnormal   Collection Time: 02/21/20  3:33 AM  Result Value Ref Range   WBC 5.9 4.0 -  10.5 K/uL   RBC 2.70 (L) 4.22 - 5.81 MIL/uL   Hemoglobin 7.8 (L) 13.0 - 17.0 g/dL   HCT 24.9 (L) 39 - 52 %   MCV 92.2 80.0 - 100.0 fL   MCH 28.9 26.0 - 34.0 pg   MCHC 31.3 30.0 - 36.0 g/dL   RDW 23.9 (H) 11.5 - 15.5 %   Platelets 90 (L) 150 - 400 K/uL    Comment: Immature Platelet Fraction may be clinically indicated, consider ordering this additional test [id number]    nRBC 0.7 (H) 0.0 - 0.2 %   Neutrophils Relative % 80 %   Neutro Abs 4.8 1.7 - 7.7 K/uL   Lymphocytes Relative 6 %   Lymphs Abs 0.3 (L) 0.7 - 4.0 K/uL   Monocytes  Relative 9 %   Monocytes Absolute 0.5 0.1 - 1.0 K/uL   Eosinophils Relative 0 %   Eosinophils Absolute 0.0 0.0 - 0.5 K/uL   Basophils Relative 0 %   Basophils Absolute 0.0 0.0 - 0.1 K/uL   WBC Morphology MORPHOLOGY UNREMARKABLE    RBC Morphology MORPHOLOGY UNREMARKABLE    Smear Review Normal platelet morphology    Immature Granulocytes 5 %   Abs Immature Granulocytes 0.28 (H) 0.00 - 0.07 K/uL    Comment: Performed at [facility], [address], [place], [place] [address]  Magnesium     Status: None   Collection Time: 02/21/20  3:33 AM  Result Value Ref Range   Magnesium 2.4 1.7 - 2.4 mg/dL    Comment: Performed at [facility], [address] [place], [place] [address]  Phosphorus     Status: Abnormal   Collection Time: 02/21/20  3:33 AM  Result Value Ref Range   Phosphorus 10.4 (H) 2.5 - 4.6 mg/dL    Comment: Performed at [facility], [address] [place], [place] [address]  Triglycerides     Status: Abnormal   Collection Time: 02/21/20  3:33 AM  Result Value Ref Range   Triglycerides 532 (H) <150 mg/dL    Comment: Performed at [facility], [address] [place], [place] [address]  Glucose, capillary     Status: Abnormal   Collection Time: 02/21/20  7:23 AM  Result Value Ref Range   Glucose-Capillary 207 (H) 70 - 99 mg/dL    Comment: Glucose reference range applies only to samples taken after fasting for at least 8 hours.  Glucose, capillary     Status: Abnormal   Collection Time: 02/21/20 11:22 AM  Result Value Ref Range   Glucose-Capillary 191 (H) 70 - 99 mg/dL    Comment: Glucose reference range applies only to samples taken after fasting for at least 8 hours.  Glucose, capillary     Status: Abnormal   Collection Time: 02/21/20  3:49 PM  Result Value Ref Range   Glucose-Capillary 168 (H) 70 - 99 mg/dL    Comment: Glucose reference range applies only to samples taken after fasting for at least 8 hours.  Glucose, capillary      Status: Abnormal   Collection Time: 02/21/20  7:12 PM  Result Value Ref Range   Glucose-Capillary 181 (H) 70 - 99 mg/dL    Comment: Glucose reference range applies only to samples taken after fasting for at least 8 hours.  Glucose, capillary     Status: Abnormal   Collection Time: 02/21/20 11:04 PM  Result Value Ref Range   Glucose-Capillary 202 (H) 70 - 99 mg/dL    Comment:  Glucose reference range applies only to samples taken after fasting for at least 8 hours.  Glucose, capillary     Status: Abnormal   Collection Time: 02/22/20  3:34 AM  Result Value Ref Range   Glucose-Capillary 242 (H) 70 - 99 mg/dL    Comment: Glucose reference range applies only to samples taken after fasting for at least 8 hours.  CBC with Differential/Platelet     Status: Abnormal   Collection Time: 02/22/20  4:23 AM  Result Value Ref Range   WBC 6.8 4.0 - 10.5 K/uL   RBC 2.89 (L) 4.22 - 5.81 MIL/uL   Hemoglobin 8.1 (L) 13.0 - 17.0 g/dL   HCT 26.3 (L) 39 - 52 %   MCV 91.0 80.0 - 100.0 fL   MCH 28.0 26.0 - 34.0 pg   MCHC 30.8 30.0 - 36.0 g/dL   RDW 22.9 (H) 11.5 - 15.5 %   Platelets 85 (L) 150 - 400 K/uL    Comment: Immature Platelet Fraction may be clinically indicated, consider ordering this additional test [id number]    nRBC 0.6 (H) 0.0 - 0.2 %   Neutrophils Relative % 80 %   Neutro Abs 5.5 1.7 - 7.7 K/uL   Lymphocytes Relative 5 %   Lymphs Abs 0.3 (L) 0.7 - 4.0 K/uL   Monocytes Relative 8 %   Monocytes Absolute 0.5 0.1 - 1.0 K/uL   Eosinophils Relative 0 %   Eosinophils Absolute 0.0 0.0 - 0.5 K/uL   Basophils Relative 1 %   Basophils Absolute 0.0 0.0 - 0.1 K/uL   WBC Morphology MILD LEFT SHIFT (1-5% METAS, OCC MYELO, OCC BANDS)    RBC Morphology MORPHOLOGY UNREMARKABLE    Smear Review Normal platelet morphology    Immature Granulocytes 6 %   Abs Immature Granulocytes 0.41 (H) 0.00 - 0.07 K/uL    Comment: Performed at [facility], [address] [place], [place] [address]   Magnesium     Status: Abnormal   Collection Time: 02/22/20  4:23 AM  Result Value Ref Range   Magnesium 2.5 (H) 1.7 - 2.4 mg/dL    Comment: Performed at [facility], [address] [place], [place] [address]  Phosphorus     Status: Abnormal   Collection Time: 02/22/20  4:23 AM  Result Value Ref Range   Phosphorus 11.1 (H) 2.5 - 4.6 mg/dL    Comment: Performed at [facility], [address] [place],  [address]  Triglycerides     Status: Abnormal   Collection Time: 02/22/20  4:23 AM  Result Value Ref Range   Triglycerides 464 (H) <150 mg/dL    Comment: Performed at [facility], [address] [place],  [address]  Comprehensive metabolic panel     Status: Abnormal   Collection Time: 02/22/20  4:23 AM  Result Value Ref Range   Sodium 138 135 - 145 mmol/L   Potassium 4.9 3.5 - 5.1 mmol/L   Chloride 96 (L) 98 - 111 mmol/L   CO2 23 22 - 32 mmol/L   Glucose, Bld 265 (H) 70 - 99 mg/dL    Comment: Glucose reference range applies only to samples taken after fasting for at least 8 hours.   BUN 109 (H) 8 - 23 mg/dL    Comment: RESULT CONFIRMED BY MANUAL DILUTION RH   Creatinine, Ser 10.65 (H) 0.61 - 1.24 mg/dL   Calcium 6.9 (L) 8.9 - 10.3 mg/dL   Total Protein 6.3 (L) 6.5 - 8.1  g/dL   Albumin 2.4 (L) 3.5 - 5.0 g/dL   AST 72 (H) 15 - 41 U/L   ALT 30 0 - 44 U/L   Alkaline Phosphatase 111 38 - 126 U/L   Total Bilirubin 0.7 0.3 - 1.2 mg/dL   GFR, Estimated 5 (L) >60 mL/min    Comment: (NOTE) Calculated using the CKD-EPI Creatinine Equation (2021)    Anion gap 19 (H) 5 - 15    Comment: Performed at [facility], [address] [place], [place] [address]  Glucose, capillary     Status: Abnormal   Collection Time: 02/22/20  7:17 AM  Result Value Ref Range   Glucose-Capillary 191 (H) 70 - 99 mg/dL    Comment: Glucose reference range applies only to samples taken after fasting for at least 8 hours.  Glucose, capillary     Status:  Abnormal   Collection Time: 02/22/20 11:11 AM  Result Value Ref Range   Glucose-Capillary 176 (H) 70 - 99 mg/dL    Comment: Glucose reference range applies only to samples taken after fasting for at least 8 hours.    Recent Results (from the past 240 hour(s))  Culture, blood (routine x 2)     Status: None   Collection Time: 02/03/2020 12:29 PM   Specimen: BLOOD  Result Value Ref Range Status   Specimen Description BLOOD RIGHT ANTECUBITAL  Final   Special Requests   Final    BOTTLES DRAWN AEROBIC AND ANAEROBIC Blood Culture adequate volume   Culture   Final    NO GROWTH 5 DAYS Performed at [facility], [address] [place], [place] [address]    Report Status 02/19/2020 FINAL  Final  Culture, blood (routine x 2)     Status: None   Collection Time: 02/18/2020 12:30 PM   Specimen: BLOOD  Result Value Ref Range Status   Specimen Description BLOOD BLOOD RIGHT HAND  Final   Special Requests   Final    BOTTLES DRAWN AEROBIC AND ANAEROBIC Blood Culture adequate volume   Culture   Final    NO GROWTH 5 DAYS Performed at [facility], [address] [place], [place] [address]    Report Status 02/19/2020 FINAL  Final  MRSA PCR Screening     Status: None   Collection Time: 02/02/2020 12:48 PM   Specimen: Nasal Mucosa; Nasopharyngeal  Result Value Ref Range Status   MRSA by PCR NEGATIVE NEGATIVE Final    Comment:        The GeneXpert MRSA Assay (FDA approved for NASAL specimens only), is one component of a comprehensive MRSA colonization surveillance program. It is not intended to diagnose MRSA infection nor to guide or monitor treatment for MRSA infections. Performed at [facility], [address] [place], [place] [address]   Culture, respiratory (non-expectorated)     Status: None   Collection Time: 02/18/20  2:11 PM   Specimen: Tracheal Aspirate; Respiratory  Result Value Ref Range Status   Specimen Description   Final    TRACHEAL  ASPIRATE Performed at [facility], [address] [place], [place] [address]    Special Requests   Final    NONE Performed at [facility], [address] [place], [place] [address]    Gram Stain   Final    ABUNDANT WBC PRESENT, PREDOMINANTLY PMN ABUNDANT GRAM POSITIVE COCCI FEW YEAST    Culture   Final    FEW Normal respiratory flora-no Staph aureus or  Pseudomonas seen Performed at [facility] [address], [place], [place] [address]    Report Status 02/21/2020 FINAL  Final    Lipid Panel Recent Labs    02/22/20 0423  TRIG 464*    Studies/Results: EEG  Result Date: 02/21/2020 Lora Havens, MD     02/21/2020  3:02 PM Patient Name:Natthew Carlean [place] Lathe XBL:[id number] Epilepsy Attending:Priyanka Barbra Sarks Referring Physician/Provider:Dr Kerney Elbe Date:02/21/2020 Duration:26.49 mins  Patient history:64 year old male s/p cardiac arrest x 10 minutes, now unresponsive on the ventilator with depressed brainstem reflexes. He is s/p TTM. EEG to evaluate for seizure  Level of alertness:comatose  AEDs during EEG study: valproic acid  Technical aspects: This EEG study was done with scalp electrodes positioned according to the 10-20 International system of electrode placement. Electrical activity was acquired at a sampling rate of 500Hz and reviewed with a high frequency filter of 70Hz and a low frequency filter of 1Hz. EEG data were recorded continuously and digitally stored.  Description:EEG showed generalized periodic epileptiform discharges with overriding fast activity at 0.25-0.5Hz as well as generalized spikes at 1 Hz Continuous generalized 3 to 5 Hz theta-delta slowing was also noted.Hyperventilation and photic stimulation were not performed.   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  No seizures were seen during the study.  EEG appears similar to previous day.    EEG  Result Date: 02/20/2020 Lora Havens, MD     02/20/2020  3:59 PM Patient Name: AMILLION MACCHIA MRN: [id number] Epilepsy Attending: Lora Havens Referring Physician/Provider: Dr Kerney Elbe Date: 02/20/2020 Duration: 29.44 mins  Patient history: 64 year old male s/p cardiac arrest x 10 minutes, now unresponsive on the ventilator with depressed brainstem reflexes. He is s/p TTM. EEG to evaluate for seizure  Level of alertness:  comatose  AEDs during EEG study:  Versed, valproic acid  Technical aspects: This EEG study was done with scalp electrodes positioned according to the 10-20 International system of electrode placement. Electrical activity was acquired at a sampling rate of 500Hz and reviewed with a high frequency filter of 70Hz and a low frequency filter of 1Hz. EEG data were recorded continuously and digitally stored.  Description: EEG showed generalized periodic epileptiform discharges with overriding fast activity at 1-2Hz as well as continuous generalized 3 to 5 Hz theta-delta slowing. Hyperventilation and photic stimulation were not performed.    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  No seizures were seen during the study.  EEG appears improved compared to previous day.  Dr Cheral Marker was notified.  Lora Havens   DG Chest Port 1 View  Result Date: 02/22/2020 CLINICAL DATA:  Encounter for intubation EXAM: PORTABLE CHEST 1 VIEW COMPARISON:  02/20/2020 FINDINGS: Endotracheal tube approximately 11 cm above  the carina. Recommend advancing 5 cm. NG tube enters the stomach with the tip not visualized. Right jugular central venous catheter tip in the cavoatrial junction. Improvement in bibasilar atelectasis. Mild vascular congestion without edema. Possible small pleural effusion bilaterally. IMPRESSION: Endotracheal tube 11 cm above the carina.  Recommend advancing 5 cm. Improved aeration in the lung  bases.  Mild vascular congestion Electronically Signed   By: Franchot Gallo M.D.   On: 02/22/2020 10:54    Medications:  Scheduled: . sodium chloride   Intravenous Once  . amiodarone  200 mg Per Tube BID  . chlorhexidine gluconate (MEDLINE KIT)  15 mL Mouth Rinse BID  . Chlorhexidine Gluconate Cloth  6 each Topical Daily  . feeding supplement (NEPRO CARB STEADY)  1,000 mL Per Tube Q24H  . feeding supplement (PROSource TF)  90 mL Per Tube 5 X Daily  . fentaNYL (SUBLIMAZE) injection  100 mcg Intravenous Once  . gentamicin cream  1 application Topical Daily  . heparin  5,000 Units Subcutaneous Q8H  . insulin aspart  0-6 Units Subcutaneous Q4H  . ipratropium-albuterol  3 mL Nebulization Q4H  . mouth rinse  15 mL Mouth Rinse 10 times per day  . methylPREDNISolone (SOLU-MEDROL) injection  20 mg Intravenous Q24H  . multivitamin  1 tablet Per Tube QHS  . valproic acid  5 mg/kg Per Tube TID   Continuous: . sodium chloride    . sodium chloride 10 mL/hr at 02/15/20 0600  . ampicillin-sulbactam (UNASYN) IV Stopped (02/21/20 1655)  . dexmedetomidine (PRECEDEX) IV infusion 0.8 mcg/kg/hr (02/22/20 1102)  . dialysis solution 1.5% low-MG/low-CA    . dialysis solution 2.5% low-MG/low-CA    . famotidine (PEPCID) IV Stopped (02/21/20 1609)  . fentaNYL infusion INTRAVENOUS 250 mcg/hr (02/22/20 0400)  . norepinephrine (LEVOPHED) Adult infusion 5 mcg/min (02/22/20 1111)  . vasopressin 0.03 Units/min (02/22/20 0617)    Assessment:64 year old male s/p cardiac arrest x 10 minutes. Has been unresponsive on the  ventilator with depressed brainstem reflexesin the context of high dose IV sedation with fentanyl, Precedex and Versed. He is s/p TTM. Versed was stopped on Wednesday. Exam improved to vegetative state on Thursday (10/21) relative to comatose versus heavily sedated on Wednesday. Exam stable on Friday.  1.Exam today while on Precedex and fentanyl is unchanged. He is in a vegetative state. DDx includes severe anoxic brain injury versus lingering effects of sedation in the setting of his ESRD in conjunction with possibly mild to moderate anoxic brain injury.  2. MRI brain on 10/17 did not show the expected signal abnormalities corresponding to what is clinically felt most likely to be a case of severe anoxic brain injury. This militates somewhat in favor of lingering sedation as a possible etiology for his clinical exam findings of comatose state improving to vegetative state after Versed was stopped.  3.EEG yesterday (10/21) appeared similar relative to Wednesday. GPEDs, generalized spikes and continuous generalized slowing were noted. 4. No myoclonus seen on today's exam.On valproic acid at 5 mg/kg per tube TID.   Recommendations: 1.Continue supportive care.  2. A repeat MRI brain is unlikely to show abnormalities as the initial MRI 3 days out was negative.  3. Eliminate all sedation if possible. Would caution against use of benzodiazepines as these can remain in the bloodstream for extended periods of time in ESRD patients, requiring an extensive wash out period to ensure that no residual sedation is confounding the exam. In that context, a repeat neurological exam for prognostication can be performed after the patient has been off all sedation for 72 hours. Please call the Neurology team at that time.  35 minutes spent in the neurological evaluation and management of this critically ill patient.    LOS: 8 days   _0  signed: Dr. Kerney Elbe 02/22/2020  12:18 PM

## 2020-02-22 NOTE — Progress Notes (Signed)
eLink Physician-Brief Progress Note Patient Name: Patrick Hurst DOB: 08/10/1955 MRN: [id number]   Date of Service  02/22/2020  HPI/Events of Note  SVT and soft blood pressures. EF 40 %.  eICU Interventions  Digoxin 0.25 mg iv x 1        Malini Flemings U Dartanyan Deasis 02/22/2020, 5:59 AM

## 2020-02-23 DIAGNOSIS — I469 Cardiac arrest, cause unspecified: Secondary | ICD-10-CM | POA: Diagnosis not present

## 2020-02-23 LAB — GLUCOSE, CAPILLARY
Glucose-Capillary: 224 mg/dL — ABNORMAL HIGH (ref 70–99)
Glucose-Capillary: 302 mg/dL — ABNORMAL HIGH (ref 70–99)
Glucose-Capillary: 315 mg/dL — ABNORMAL HIGH (ref 70–99)
Glucose-Capillary: 367 mg/dL — ABNORMAL HIGH (ref 70–99)

## 2020-02-23 LAB — CBC WITH DIFFERENTIAL/PLATELET
Abs Immature Granulocytes: 0.5 10*3/uL — ABNORMAL HIGH (ref 0.00–0.07)
Basophils Absolute: 0 10*3/uL (ref 0.0–0.1)
Basophils Relative: 0 %
Eosinophils Absolute: 0 10*3/uL (ref 0.0–0.5)
Eosinophils Relative: 0 %
HCT: 24.5 % — ABNORMAL LOW (ref 39.0–52.0)
Hemoglobin: 7.6 g/dL — ABNORMAL LOW (ref 13.0–17.0)
Immature Granulocytes: 7 %
Lymphocytes Relative: 5 %
Lymphs Abs: 0.3 10*3/uL — ABNORMAL LOW (ref 0.7–4.0)
MCH: 28.4 pg (ref 26.0–34.0)
MCHC: 31 g/dL (ref 30.0–36.0)
MCV: 91.4 fL (ref 80.0–100.0)
Monocytes Absolute: 0.5 10*3/uL (ref 0.1–1.0)
Monocytes Relative: 7 %
Neutro Abs: 5.6 10*3/uL (ref 1.7–7.7)
Neutrophils Relative %: 81 %
Platelets: 84 10*3/uL — ABNORMAL LOW (ref 150–400)
RBC: 2.68 MIL/uL — ABNORMAL LOW (ref 4.22–5.81)
RDW: 21.6 % — ABNORMAL HIGH (ref 11.5–15.5)
WBC: 6.9 10*3/uL (ref 4.0–10.5)
nRBC: 0.6 % — ABNORMAL HIGH (ref 0.0–0.2)

## 2020-02-23 LAB — PHOSPHORUS: Phosphorus: 8.7 mg/dL — ABNORMAL HIGH (ref 2.5–4.6)

## 2020-02-23 LAB — TRIGLYCERIDES: Triglycerides: 922 mg/dL — ABNORMAL HIGH (ref <150)

## 2020-02-23 LAB — MAGNESIUM: Magnesium: 2.2 mg/dL (ref 1.7–2.4)

## 2020-02-23 MED ORDER — GLYCOPYRROLATE 0.2 MG/ML IJ SOLN: 0.2000 mg | INTRAMUSCULAR | Status: DC | PRN

## 2020-02-23 MED ORDER — GLYCOPYRROLATE 0.2 MG/ML IJ SOLN
0.1000 mg | Freq: Once | INTRAMUSCULAR | Status: AC
  Administered 2020-02-23: 0.1 mg via INTRAVENOUS
  Filled 2020-02-23: qty 1

## 2020-02-23 MED ORDER — LORAZEPAM 2 MG/ML IJ SOLN
2.0000 mg | INTRAMUSCULAR | Status: DC | PRN
  Administered 2020-02-23: 2 mg via INTRAVENOUS
  Filled 2020-02-23: qty 1

## 2020-02-23 MED ORDER — MORPHINE 100MG IN NS 100ML (1MG/ML) PREMIX INFUSION
1.0000 mg/h | INTRAVENOUS | Status: DC
  Administered 2020-02-23: 1 mg/h via INTRAVENOUS
  Administered 2020-02-23: 40 mg/h via INTRAVENOUS
  Filled 2020-02-23 (×4): qty 100

## 2020-02-23 NOTE — Progress Notes (Signed)
  Chaplain On-Call returned to the Unit at [place] and learned from patient's wife Jackelyn Poling that the patient died about 20 minutes earlier.  Chaplain provided spiritual and emotional support, life review, and prayer.  [place] Taher Vannote M.Div., [facility]

## 2020-02-23 NOTE — Progress Notes (Signed)
Patient Name: Patrick Hurst Date of Encounter: 02/06/2020  Hospital Problem List     Active Problems:   Cardiac arrest [facility])   Pressure injury of skin    Patient Profile     64 y.o.malewith history ofparoxysmal atrial fibrillation, HFpEF with an ejection fraction of 55 to 60%, hypertension, thyroid disease, diabetes, exogenous obesity, thrombocytopenia, end-stage renal disease who was admitted after presenting for GI work-up where he suffered a PEA cardiac arrest on October 14 during the procedure. He had approximately 10 minutes of CPR and intubated and required echo ventilation... Echocardiogram postarrest was read as showingpreserved LV function with EF of 50 to 55% which is low normal. No regional wall motion abnormalities. Moderate LVH. He was felt to have mild aortic sclerosis with no significant stenosis. He was placed on code ice. It is of note that he had previous admission in April of this year with acute hypoxic respiratory failure as well as symptomatic anemia. Endoscopy team was present at the onset of the arrest stated the patient became bradycardiac followed by PEA. As hemoglobin was 6.1. He had sinus rhythm on the monitor. He had had his amiodarone and Eliquis held prior to the procedure. Troponin was mildly elevated and flat consistent with demand. Did not appear to have have had an acute coronary event. EKG at the time showed sinus rhythm with nonspecific ST-T wave changes. Head CT at the time of arrest showed no mass or hemorrhage with no acute infarct. Chest x-ray postintubation showed cardiomegaly with vascular congestion. Laboratory showed a potassium of 5.9 with a creatinine of 19.17. BNP was 116.9 patient remains intubated. He haspredominantly been in sinus rhythm although has had episodes of breakthrough A. fib with RVR.   Subjective   Patient continues to be intubated and sedated.  This a.m. the patient has had acute onset of what appears to be  atrial flutter with rapid ventricular rate at 150 bpm with 2 1 block.  The patient may have had this episode due to agitation, dialysis, continued multiorgan failure, and or other cardiovascular causes.  The patient does not appear to have had progression of poor oxygenation or congestive heart failure from this issue.  After dialysis the patient remained in rapid rate and it is appropriate for further treatment with reasonable blood pressure margin at this time.  There has been some discussion of palliative care and therefore will be more conservative with treatment of this particular flutter due to the fact that it does not appear to be causing any primary issue.  The patient does have amiodarone treatment for atrial fibrillation although amiodarone is not as effective with atrial flutter.  The patient did have an episode where he converted back to normal sinus rhythm last night but here appears in the morning to be atrial flutter with variable block  Inpatient Medications    . sodium chloride   Intravenous Once  . amiodarone  200 mg Per Tube BID  . chlorhexidine gluconate (MEDLINE KIT)  15 mL Mouth Rinse BID  . Chlorhexidine Gluconate Cloth  6 each Topical Daily  . feeding supplement (NEPRO CARB STEADY)  1,000 mL Per Tube Q24H  . feeding supplement (PROSource TF)  90 mL Per Tube 5 X Daily  . fentaNYL (SUBLIMAZE) injection  100 mcg Intravenous Once  . gentamicin cream  1 application Topical Daily  . heparin  5,000 Units Subcutaneous Q8H  . insulin aspart  0-6 Units Subcutaneous Q4H  . ipratropium-albuterol  3 mL Nebulization Q4H  .  mouth rinse  15 mL Mouth Rinse 10 times per day  . methylPREDNISolone (SOLU-MEDROL) injection  20 mg Intravenous Q24H  . metoprolol tartrate  50 mg Per Tube BID  . multivitamin  1 tablet Per Tube QHS  . valproic acid  5 mg/kg Per Tube TID    Vital Signs    Vitals:   03/02/2020 0600 02/09/2020 0615 02/20/2020 0630 03/02/2020 0645  BP:      Pulse: (!) 29 (!) 39 (!) 37  (!) 154  Resp: (!) 22 (!) 21 (!) 25 (!) 21  Temp:      TempSrc:      SpO2: 94% 96% 95% 92%  Weight:      Height:        Intake/Output Summary (Last 24 hours) at 02/10/2020 0704 Last data filed at 02/21/2020 0200 Gross per 24 hour  Intake 1785.32 ml  Output 339 ml  Net 1446.32 ml   Filed Weights   02/21/20 0330 02/22/20 0336 02/06/2020 0500  Weight: (!) 137.7 kg (!) 141.3 kg (!) 141.5 kg    Physical Exam    GEN: Critically ill male on ventilator in ICU HEENT: normal.  Neck: Supple, no JVD, carotid bruits, or masses. Cardiac: Irregular regular Respiratory: Breathing with ventilator lly. GI: Soft, nontender, nondistended, BS + x 4. MS: no deformity or atrophy. Skin: warm and dry, no rash. Neuro: Intubated and sedated  Labs    CBC Recent Labs    02/22/20 0423 02/22/2020 0417  WBC 6.8 6.9  NEUTROABS 5.5 5.6  HGB 8.1* 7.6*  HCT 26.3* 24.5*  MCV 91.0 91.4  PLT 85* 84*   Basic Metabolic Panel Recent Labs    02/22/20 0423 02/04/2020 0417  NA 138  --   K 4.9  --   CL 96*  --   CO2 23  --   GLUCOSE 265*  --   BUN 109*  --   CREATININE 10.65*  --   CALCIUM 6.9*  --   MG 2.5* 2.2  PHOS 11.1* 8.7*   Liver Function Tests Recent Labs    02/22/20 0423  AST 72*  ALT 30  ALKPHOS 111  BILITOT 0.7  PROT 6.3*  ALBUMIN 2.4*   No results for input(s): LIPASE, AMYLASE in the last 72 hours. Cardiac Enzymes No results for input(s): CKTOTAL, CKMB, CKMBINDEX, TROPONINI in the last 72 hours. BNP No results for input(s): BNP in the last 72 hours. D-Dimer No results for input(s): DDIMER in the last 72 hours. Hemoglobin A1C No results for input(s): HGBA1C in the last 72 hours. Fasting Lipid Panel Recent Labs    03/01/2020 0417  TRIG 922*   Thyroid Function Tests No results for input(s): TSH, T4TOTAL, T3FREE, THYROIDAB in the last 72 hours.  Invalid input(s): FREET3  Telemetry    afib/nsr  ECG    nsr with no ischemia  Radiology    EEG  Result Date:  02/21/2020 Lora Havens, MD     02/21/2020  3:02 PM Patient Name:Patrick Hurst JOA:[id number] Epilepsy Attending:Priyanka Barbra Sarks Referring Physician/Provider:Dr Kerney Elbe Date:02/21/2020 Duration:26.49 mins  Patient history:64 year old male s/p cardiac arrest x 10 minutes, now unresponsive on the ventilator with depressed brainstem reflexes. He is s/p TTM. EEG to evaluate for seizure  Level of alertness:comatose  AEDs during EEG study: valproic acid  Technical aspects: This EEG study was done with scalp electrodes positioned according to the 10-20 International system of electrode placement. Electrical activity was acquired at a sampling rate  of '500Hz'  and reviewed with a high frequency filter of '70Hz'  and a low frequency filter of '1Hz' . EEG data were recorded continuously and digitally stored.  Description:EEG showed generalized periodic epileptiform discharges with overriding fast activity at 0.25-0.'5Hz'  as well as generalized spikes at 1 Hz Continuous generalized 3 to 5 Hz theta-delta slowing was also noted.Hyperventilation and photic stimulation were not performed.   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  No seizures were seen during the study.  EEG appears similar to previous day.    EEG  Result Date: 02/20/2020 Lora Havens, MD     02/20/2020  3:59 PM Patient Name: Patrick Hurst MRN: [id number] Epilepsy Attending: Lora Havens Referring Physician/Provider: Dr Kerney Elbe Date: 02/20/2020 Duration: 29.44 mins  Patient history: 64 year old male s/p cardiac arrest x 10 minutes, now unresponsive on the ventilator with depressed brainstem reflexes. He is s/p TTM. EEG to evaluate for seizure   Level of alertness:  comatose  AEDs during EEG study:  Versed, valproic acid  Technical aspects: This EEG study was done with scalp electrodes positioned according to the 10-20 International system of electrode placement. Electrical activity was acquired at a sampling rate of '500Hz'  and reviewed with a high frequency filter of '70Hz'  and a low frequency filter of '1Hz' . EEG data were recorded continuously and digitally stored.  Description: EEG showed generalized periodic epileptiform discharges with overriding fast activity at 1-'2Hz'  as well as continuous generalized 3 to 5 Hz theta-delta slowing. Hyperventilation and photic stimulation were not performed.    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  No seizures were seen during the study.  EEG appears improved compared to previous day.  Dr Cheral Marker was notified.  Lora Havens   EEG  Result Date: 02/19/2020 Lora Havens, MD     02/19/2020  4:53 PM Patient Name: Patrick Hurst MRN: [id number] Epilepsy Attending: Lora Havens Referring Physician/Provider: Dr Flora Lipps Date: 02/19/2020 Duration: 24.51 mins Patient history: 64 year old male s/p cardiac arrest x 10 minutes, now unresponsive on the ventilator with depressed brainstem reflexes. He is s/p TTM. EEG to evaluate for seizure Level of alertness:  comatose AEDs during EEG study: None Technical aspects: This EEG study was done with scalp electrodes positioned according to the 10-20 International system of electrode placement. Electrical activity was acquired at a sampling rate of '500Hz'  and reviewed with a high frequency filter of '70Hz'  and a low frequency filter of '1Hz' . EEG data were recorded continuously and digitally stored. Description:  EEG showed generalized periodic epileptiform discharges with overriding fast activity at 1-1.'5hz'  as well as generalized background suppression. Two seizures were noted at 1455 and 1504, lasting 45 seconds and 1.5 mins respectively. On video patient was noted to have sudden eye opening, upward gaze deviation and rhythmic arm twitching. Concomitant eeg showed generalized polyspikes.  Hyperventilation and photic stimulation were not performed.   ABNORMALITY - Myoclonic Seizure, generalized - Periodic epileptiform discharges with plus pattern, generalized - Background suppression, generalized IMPRESSION: This study showed two myoclonic seizures as described above with generalized onset, at 1455 and 1504, lasting  45 seconds and 1.5 mins respectively. 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 Dr Cheral Marker was notified. Lora Havens   DG Chest 1 View  Result Date: 02/18/2020 CLINICAL DATA:  Oxygen desaturation EXAM: CHEST  1 VIEW COMPARISON:  Yesterday FINDINGS: The endotracheal tube tip is at the clavicular heads. The enteric tube tip is not visualized beyond the lower esophagus. Dialysis catheter on the right with tip at the upper right atrium. Cardiopericardial enlargement. Increased density at the bases, especially on the left. No visible effusion or pneumothorax. These results will be called to the ordering clinician or representative by the Radiologist Assistant, and communication documented in the PACS or [employer]. IMPRESSION: 1. The enteric tube tip appears to terminate at the lower esophagus, but certainty is limited by patient anatomy. Consider advancement and KUB. 2. Cardiomegaly with mild interval lower lobe opacification, atelectasis or infiltrate. Electronically Signed   By: Monte Fantasia M.D.   On: 02/18/2020 05:50   DG Abd 1 View  Result Date: 02/18/2020 CLINICAL DATA:  OG tube placement EXAM: ABDOMEN - 1  VIEW COMPARISON:  02/09/2020 FINDINGS: Enteric tube tip is in the left upper quadrant consistent with location in the upper stomach. There has been advancement since the previous study. Right central venous catheter with tip over the cavoatrial junction region. Visualized bowel are not abnormally distended. Vascular calcifications in the upper abdomen. Old left rib fractures. Consolidation and air bronchograms in the left lung base. IMPRESSION: Enteric tube tip is been advanced and now in satisfactory apparent position. Electronically Signed   By: Lucienne Capers M.D.   On: 02/18/2020 06:48   DG Abd 1 View  Result Date: 02/05/2020 CLINICAL DATA:  OG tube placement EXAM: ABDOMEN - 1 VIEW COMPARISON:  July 26, 2019 FINDINGS: The enteric tube projects over the gastric body. The visualized bowel gas pattern is nonspecific. IMPRESSION: The enteric tube projects over the gastric body. Electronically Signed   By: Constance Holster M.D.   On: 02/20/2020 17:55   CT HEAD WO CONTRAST  Result Date: 02/09/2020 CLINICAL DATA:  Status post cardiac arrest EXAM: CT HEAD WITHOUT CONTRAST TECHNIQUE: Contiguous axial images were obtained from the base of the skull through the vertex without intravenous contrast. COMPARISON:  March 13, 2012 head CT and brain MRI. FINDINGS: Brain: Ventricles and sulci are normal in size and configuration. There is no intracranial mass, hemorrhage, extra-axial fluid collection, or midline shift. There is preservation of gray-white differentiation without evident cerebral edema. Brain parenchyma appears unremarkable. No acute infarct evident. There is a mild area of deep sulcal asymmetry in the right temporal region compared to the left, stable and a presumed anatomic variant. Vascular: No hyperdense vessel. There is calcification in each carotid siphon region. Skull: The bony calvarium appears intact. Sinuses/Orbits: Visualized paranasal sinuses are clear. Orbits appear symmetric  bilaterally. Other: Mastoid air cells are clear. IMPRESSION: Preservation of gray-white differentiation without edema. No acute infarct. No mass or hemorrhage. Foci of arterial vascular calcification noted. Electronically Signed   By: Lowella Grip III M.D.   On: 02/25/2020 13:03   CT ANGIO CHEST PE W OR WO CONTRAST  Result Date: 02/18/2020 CLINICAL DATA:  Respiratory failure EXAM: CT ANGIOGRAPHY CHEST WITH CONTRAST TECHNIQUE: Multidetector CT imaging of the chest was performed using the standard protocol during bolus administration of intravenous contrast. Multiplanar CT image reconstructions and MIPs were obtained to evaluate the vascular anatomy. CONTRAST:  2m OMNIPAQUE IOHEXOL 350  MG/ML SOLN COMPARISON:  11/19/2019 FINDINGS: Cardiovascular: Moderate coronary artery calcifications, most pronounced in the left anterior descending coronary artery. Aortic calcifications. No aneurysm. Mild cardiomegaly. Mediastinum/Nodes: No mediastinal, hilar, or axillary adenopathy. Endotracheal tube tip is in the midtrachea. Thyroid unremarkable. Lungs/Pleura: Lower lobe volume loss and consolidation bilaterally. This could reflect atelectasis, pneumonia, or aspiration. No effusions. Upper Abdomen: Subtle nodularity of the liver suggesting cirrhosis as seen on prior study. Prior cholecystectomy. Small amount of perihepatic ascites. Musculoskeletal: Chest wall soft tissues are unremarkable. No acute bony abnormality. Review of the MIP images confirms the above findings. IMPRESSION: No evidence of pulmonary embolus. Cardiomegaly, coronary artery disease. Volume loss and dense consolidation in the lower lobes which could reflect atelectasis, pneumonia or aspiration. Nodular contours of the liver suggest cirrhosis. Small amount of Peri hepatic ascites. Aortic Atherosclerosis (ICD10-I70.0). Electronically Signed   By: Rolm Baptise M.D.   On: 02/18/2020 17:46   MR BRAIN WO CONTRAST  Result Date: 02/17/2020 CLINICAL DATA:   Neuro deficit.  Stroke suspected. EXAM: MRI HEAD WITHOUT CONTRAST TECHNIQUE: Multiplanar, multiecho pulse sequences of the brain and surrounding structures were obtained without intravenous contrast. COMPARISON:  02/09/2020 head CT and prior.  03/13/2012 MRI head. FINDINGS: Brain: No diffusion-weighted signal abnormality. No intracranial hemorrhage. No midline shift, ventriculomegaly or extra-axial fluid collection. No mass lesion. Remote right basal ganglia insult. Vascular: Grossly preserved major intracranial flow voids. Skull and upper cervical spine: Normal marrow signal. Sinuses/Orbits: Sequela of bilateral lens replacement. Bilateral proptosis, unchanged. Mild to moderate pansinus mucosal thickening. Bilateral mastoid effusions. Other: None. IMPRESSION: No acute intracranial process. Remote right basal ganglia insult. Electronically Signed   By: Primitivo Gauze M.D.   On: 02/17/2020 15:02   DG Chest Port 1 View  Result Date: 02/22/2020 CLINICAL DATA:  Encounter for intubation EXAM: PORTABLE CHEST 1 VIEW COMPARISON:  02/20/2020 FINDINGS: Endotracheal tube approximately 11 cm above the carina. Recommend advancing 5 cm. NG tube enters the stomach with the tip not visualized. Right jugular central venous catheter tip in the cavoatrial junction. Improvement in bibasilar atelectasis. Mild vascular congestion without edema. Possible small pleural effusion bilaterally. IMPRESSION: Endotracheal tube 11 cm above the carina.  Recommend advancing 5 cm. Improved aeration in the lung bases.  Mild vascular congestion Electronically Signed   By: Franchot Gallo M.D.   On: 02/22/2020 10:54   DG Chest Port 1 View  Result Date: 02/20/2020 CLINICAL DATA:  Shortness of breath EXAM: PORTABLE CHEST 1 VIEW COMPARISON:  Two days ago FINDINGS: The endotracheal tube tip is at the clavicular heads. The enteric tube at least reaches the diaphragm. Dialysis catheter with tip at the upper cavoatrial junction. Increased hazy  opacification of the bilateral lower chest compared to radiograph. By recent chest CT there is bilateral lower lobe collapse. Cardiomegaly. No pneumothorax or clear pulmonary edema. IMPRESSION: 1. Unremarkable hardware positioning. 2. Persistence of bilateral lower lobe collapse seen on recent chest CT. Electronically Signed   By: Monte Fantasia M.D.   On: 02/20/2020 05:22   DG Chest Port 1 View  Result Date: 02/17/2020 CLINICAL DATA:  Respiratory failure EXAM: PORTABLE CHEST 1 VIEW COMPARISON:  02/15/2020 FINDINGS: Unchanged support apparatus. Shallow lung inflation without focal airspace consolidation. Mild cardiomegaly. IMPRESSION: Shallow lung inflation without focal airspace consolidation. Electronically Signed   By: Ulyses Jarred M.D.   On: 02/17/2020 06:21   DG Chest Port 1 View  Result Date: 02/15/2020 CLINICAL DATA:  Respiratory failure. EXAM: PORTABLE CHEST 1 VIEW COMPARISON:  Chest x-ray 02/02/2020, CT  chest 11/19/2019 FINDINGS: Endotracheal tube with tip approximately 8 cm above the carina. Enteric tube courses below diaphragm with tip and side port overlying the expected region the gastric lumen. PO contrast is noted partially opacifying the gastric lumen. Right chest wall dialysis catheter again noted with tip overlying the right atrium with tip near the inferior cavoatrial junction a (tip likely more cranially within the right atrium; however, due to low lung volumes tip is near the junction). The heart size and mediastinal contours are unchanged. Low lung volumes. Vague patchy airspace opacities. Increased interstitial markings. No pleural effusion. No pneumothorax. No acute osseous abnormality. IMPRESSION: 1. Enteric tube with tip terminating 8 cm above the carina. Consider advancing by 2 cm. 2. Low lung volumes with vague patchy airspace opacities that likely represent atelectasis. 3. Cardiomegaly with mild pulmonary edema. Electronically Signed   By: Iven Finn M.D.   On:  02/15/2020 02:28   DG Chest Port 1 View  Result Date: 02/22/2020 CLINICAL DATA:  Intubation EXAM: PORTABLE CHEST 1 VIEW COMPARISON:  12/27/2019 FINDINGS: There is a well-positioned tunneled dialysis catheter on the right. The endotracheal tube terminates above the carina and just below the thoracic inlet. The tube terminates approximately 8 cm above the carina. The enteric tube appears to extend below the left hemidiaphragm. The heart size is stable but enlarged. The lung volumes are low. There is vascular congestion with small bilateral pleural effusions and bibasilar atelectasis. There is no acute osseous abnormality. IMPRESSION: 1. Lines and tubes as above. 2. Low lung volumes with bibasilar atelectasis and small bilateral pleural effusions. 3. Cardiomegaly with vascular congestion. Electronically Signed   By: Constance Holster M.D.   On: 02/05/2020 17:54   EEG adult  Result Date: 02/16/2020 Alexis Goodell, MD     02/15/2020  3:29 PM ELECTROENCEPHALOGRAM REPORT Patient: Patrick Hurst       Room #: IC06A EEG No. ID: [id number] Age: 64 y.o.        Sex: male Requesting Physician: Kasa Report Date:  02/21/2020       Interpreting Physician: Alexis Goodell History: JERRID FORGETTE is an 64 y.o. male with PEA arrest during colonoscopy Medications: Fentanyl, Propofol, Levophed, ASA, Conditions of Recording:  This is a 21 channel routine scalp EEG performed with bipolar and monopolar montages arranged in accordance to the international 10/20 system of electrode placement. One channel was dedicated to EKG recording. The patient is in the intubated and sedated state. Description:  The background activity is very low voltage and often obscured by low voltage artifact and electrocardiogram artifact.   At times some very slow polymorphic delta activity could be identified.  No epileptiform activity is noted.  The patient was stimulated during the recording with no activation of the background rhythm noted.   Hyperventilation and intermittent photic stimulation were not performed. IMPRESSION: This electroencephalogram is characterized by very slow, low voltage background activity that is consistent with a medication effect, but can not rule out a severe diffuse cerebral disturbance as well, etiologically nonspecific.  Clinical correlation recommended.  No epileptiform activity is noted.  Alexis Goodell, MD Neurology 02/16/2020, 2:43 PM   ECHOCARDIOGRAM COMPLETE  Result Date: 02/15/2020    ECHOCARDIOGRAM REPORT   Patient Name:   KAYDIN KARBOWSKI Date of Exam: 02/18/2020 Medical Rec #:  [id number]      Height:       72.0 in Accession #:    [id number]     Weight:       289.2  lb Date of Birth:  1955-08-28      BSA:          2.492 m Patient Age:    2 years       BP:           121/46 mmHg Patient Gender: M              HR:           88 bpm. Exam Location:  [facility] Procedure: 2D Echo, Cardiac Doppler and Color Doppler Indications:     Cardiac Arrest 146.9  History:         Patient has prior history of Echocardiogram examinations, most                  recent 11/16/2019. CHF.  Sonographer:     Sherrie Sport RDCS (AE) Referring Phys:  [id number] [place] L [place] Diagnosing Phys: Ida Rogue MD  Sonographer Comments: Echo performed with patient supine and on artificial respirator. IMPRESSIONS  1. Left ventricular ejection fraction, by estimation, is 50 to 55%. The left ventricle has low normal function. The left ventricle has no regional wall motion abnormalities. There is moderate left ventricular hypertrophy. Indeterminate diastolic filling  due to E-A fusion.  2. The aortic valve was not well visualized. Aortic valve regurgitation is not visualized. Mild to moderate aortic valve sclerosis/calcification is present, without any evidence of aortic stenosis. FINDINGS  Left Ventricle: Left ventricular ejection fraction, by estimation, is 50 to 55%. The left ventricle has low normal function. The left ventricle has no regional  wall motion abnormalities. The left ventricular internal cavity size was normal in size. There is moderate left ventricular hypertrophy. Indeterminate diastolic filling due to E-A fusion. Right Ventricle: The right ventricular size is normal. No increase in right ventricular wall thickness. Right ventricular systolic function is normal. Tricuspid regurgitation signal is inadequate for assessing PA pressure. Left Atrium: Left atrial size was normal in size. Right Atrium: Right atrial size was normal in size. Pericardium: There is no evidence of pericardial effusion. Mitral Valve: The mitral valve is normal in structure. Mild to moderate mitral annular calcification. No evidence of mitral valve regurgitation. No evidence of mitral valve stenosis. Tricuspid Valve: The tricuspid valve is normal in structure. Tricuspid valve regurgitation is not demonstrated. No evidence of tricuspid stenosis. Aortic Valve: The aortic valve was not well visualized. Aortic valve regurgitation is not visualized. Mild to moderate aortic valve sclerosis/calcification is present, without any evidence of aortic stenosis. Aortic valve mean gradient measures 5.5 mmHg.  Aortic valve peak gradient measures 10.2 mmHg. Aortic valve area, by VTI measures 2.12 cm. Pulmonic Valve: The pulmonic valve was normal in structure. Pulmonic valve regurgitation is not visualized. No evidence of pulmonic stenosis. Aorta: The aortic root is normal in size and structure. Venous: The inferior vena cava is normal in size with greater than 50% respiratory variability, suggesting right atrial pressure of 3 mmHg. IAS/Shunts: No atrial level shunt detected by color flow Doppler.  LEFT VENTRICLE PLAX 2D LVIDd:         4.64 cm  Diastology LVIDs:         2.94 cm  LV e' medial:    7.29 cm/s LV PW:         1.74 cm  LV E/e' medial:  12.2 LV IVS:        1.94 cm  LV e' lateral:   4.46 cm/s LVOT diam:     2.30 cm  LV E/e' lateral:  19.9 LV SV:         58 LV SV Index:   23 LVOT  Area:     4.15 cm  LEFT ATRIUM             Index       RIGHT ATRIUM           Index LA Vol (A2C):   71.8 ml 28.81 ml/m RA Area:     27.80 cm LA Vol (A4C):   47.2 ml 18.94 ml/m RA Volume:   99.70 ml  40.01 ml/m LA Biplane Vol: 61.2 ml 24.56 ml/m  AORTIC VALVE                    PULMONIC VALVE AV Area (Vmax):    2.03 cm     PV Vmax:        0.93 m/s AV Area (Vmean):   1.97 cm     PV Peak grad:   3.4 mmHg AV Area (VTI):     2.12 cm     RVOT Peak grad: 5 mmHg AV Vmax:           160.00 cm/s AV Vmean:          106.000 cm/s AV VTI:            0.272 m AV Peak Grad:      10.2 mmHg AV Mean Grad:      5.5 mmHg LVOT Vmax:         78.30 cm/s LVOT Vmean:        50.300 cm/s LVOT VTI:          0.139 m LVOT/AV VTI ratio: 0.51  AORTA Ao Root diam: 3.60 cm MITRAL VALVE               TRICUSPID VALVE MV Area (PHT): 2.48 cm    TR Peak grad:   14.4 mmHg MV Decel Time: 306 msec    TR Vmax:        190.00 cm/s MV E velocity: 88.80 cm/s MV A velocity: 47.50 cm/s  SHUNTS MV E/A ratio:  1.87        Systemic VTI:  0.14 m                            Systemic Diam: 2.30 cm Ida Rogue MD Electronically signed by Ida Rogue MD Signature Date/Time: 02/20/2020/4:14:37 PM    Final    ECHOCARDIOGRAM LIMITED  Result Date: 02/18/2020    ECHOCARDIOGRAM LIMITED REPORT   Patient Name:   Patrick Hurst Date of Exam: 02/18/2020 Medical Rec #:  [id number]      Height:       72.0 in Accession #:    [id number]     Weight:       292.3 lb Date of Birth:  10/11/1955      BSA:          2.504 m Patient Age:    56 years       BP:           Not listed in chart/Not listed in                                              chart mmHg Patient Gender: M  HR:           73 bpm. Exam Location:  [facility] Procedure: Cardiac Doppler, Color Doppler and Limited Echo Indications:     Cardiac Arrest 146.9  History:         Patient has prior history of Echocardiogram examinations, most                  recent 02/03/2020. Risk Factors:Sleep Apnea. Chronic  diastolic                  CHF.  Sonographer:     Sherrie Sport RDCS (AE) Referring Phys:  [place] Diagnosing Phys: Bartholome Bill MD IMPRESSIONS  1. Left ventricular ejection fraction, by estimation, is 40 to 45%. The left ventricle has mild to moderately decreased function. The left ventricle demonstrates global hypokinesis. There is moderate left ventricular hypertrophy.  2. Right ventricular systolic function is normal. The right ventricular size is mildly enlarged.  3. Left atrial size was mildly dilated.  4. The mitral valve was not well visualized. Trivial mitral valve regurgitation.  5. The aortic valve was not well visualized. Aortic valve regurgitation is trivial. FINDINGS  Left Ventricle: Left ventricular ejection fraction, by estimation, is 40 to 45%. The left ventricle has mild to moderately decreased function. The left ventricle demonstrates global hypokinesis. There is moderate left ventricular hypertrophy. Right Ventricle: The right ventricular size is mildly enlarged. Right vetricular wall thickness was not well visualized. Right ventricular systolic function is normal. Left Atrium: Left atrial size was mildly dilated. Right Atrium: Right atrial size was normal in size. Pericardium: There is no evidence of pericardial effusion. Mitral Valve: The mitral valve was not well visualized. Trivial mitral valve regurgitation. Tricuspid Valve: The tricuspid valve is not well visualized. Tricuspid valve regurgitation is mild. Aortic Valve: The aortic valve was not well visualized. Aortic valve regurgitation is trivial. Pulmonic Valve: The pulmonic valve was not well visualized. Pulmonic valve regurgitation is trivial. IAS/Shunts: The interatrial septum was not assessed. LEFT VENTRICLE PLAX 2D LVIDd:         4.86 cm      Diastology LVIDs:         4.18 cm      LV e' medial:    5.66 cm/s LV PW:         1.81 cm      LV E/e' medial:  12.9 LV IVS:        2.12 cm      LV e' lateral:   11.90 cm/s LVOT diam:      2.30 cm      LV E/e' lateral: 6.1 LVOT Area:     4.15 cm  LV Volumes (MOD) LV vol d, MOD A2C: 162.0 ml LV vol d, MOD A4C: 178.0 ml LV vol s, MOD A2C: 84.5 ml LV vol s, MOD A4C: 111.0 ml LV SV MOD A2C:     77.5 ml LV SV MOD A4C:     178.0 ml LV SV MOD BP:      80.7 ml LEFT ATRIUM             Index       RIGHT ATRIUM           Index LA diam:        4.00 cm 1.60 cm/m  RA Area:     29.00 cm LA Vol (A2C):   75.3 ml 30.08 ml/m RA Volume:   85.70 ml  34.23 ml/m LA Vol (  A4C):   96.6 ml 38.59 ml/m LA Biplane Vol: 88.2 ml 35.23 ml/m                        PULMONIC VALVE AORTA                 PV Vmax:        0.59 m/s Ao Root diam: 3.30 cm PV Peak grad:   1.4 mmHg                       RVOT Peak grad: 3 mmHg  MITRAL VALVE               TRICUSPID VALVE MV Area (PHT): 2.52 cm    TR Peak grad:   14.6 mmHg MV Decel Time: 301 msec    TR Vmax:        191.00 cm/s MV E velocity: 72.80 cm/s MV A velocity: 49.00 cm/s  SHUNTS MV E/A ratio:  1.49        Systemic Diam: 2.30 cm Bartholome Bill MD Electronically signed by Bartholome Bill MD Signature Date/Time: 02/18/2020/1:21:04 PM    Final     Assessment & Plan    64 yo male with a significant history of COPD, OSA, T2DM, ESRD, CHF, AF presented to Endoscopy for an outpatient colonoscopy procedure.Duringthe procedure his heart rate began to drop and he PEA arrested requiring 10 minutes of CPR prior to achieving ROSC. He received epinephrine, atropine, calcium, sodium bicarb &D50. He was intubated during the code blue requiring mechanical ventilation.Patient underwent code ice protocol. He currently is intubated and on IV amiodarone.  1. Cardiac arrest-etiology of this is unclear. Appear to be a PEA arrest. Patient's high-sensitivity troponin was mildly elevated but relatively flat. Does not appear to suggest an acute coronary syndrome event. EKG also did not suggest acute coronary syndrome without ST elevation or to suggest this. Patient did not have pericardial  effusion. No evidence of pneumothorax. Electrolytes were stable. Patient received prompt ACLS care. Currently intubated and sedated.   2. Respiratory arrest-currently ventilator dependent. Appreciate excellent care by ICU team. Conitnued vent support. Weaning as tolerated.  Chest CT revealed no pulmonary embolus.   3. Abnormal serum troponin-as per above, does not appear to be an acute coronary event. Further assessment of this can be considered once he is recovered from this event. Not a candidate for invasive evaluation at present. Would continue with subcu heparin. Was symptomatically anemic and required transfusion. Would not use antiplatelet therapy or anticoagulation at present until this has been further assessed. Not a candidate for antiplatelet therapy from an ischemic [place].   4. Atrial fibrillation atrial flutter-has had paroxysmal A. fib as an outpatient. Was on Eliquis and amiodarone. Had stopped the Eliquis and amiodarone prior to his EGD/colonoscopy. Would remain off of Eliquis and other anticoagulation due to concern over GI bleed. Continue with amiodarone 200 mg twice daily via his NG tube.  Due to inadequate response of atrial flutter to amiodarone will use heart rate control with NG tube addition of beta-blocker   5. End-stage renal disease-appreciate nephrology input. Will likely require HD for some time. Had been on PD as an outpatient.  6. Anoxic encephalopathy.-Appreciate neurology input. EEG appears to suggest anoxic encephalopathy.    7. CHF-EF 50 to 55% by echo postarrest. ECHO this am revealed EF of 40-45% but still global. No wall motiion abnromalitiesNo effusion.   8.  Further consideration of  palliative care for which may drive the majority of significant answers today and further treatment options  Serafina Royals, MD, [facility]

## 2020-02-23 NOTE — Progress Notes (Signed)
CRITICAL CARE NOTE 64 yo male presented for outpatient colonoscopy PEA cardiac arrested during the procedure, s/p 10 minutes CPR, intubated requiring mechanical ventilation with Targeted Temperature Management admitted to the ICU.  10/14Code Blue in Endo procedure, intubated, admitted to ICU for TTM 02/15/20- patient evaluated at bedside. Spoke to wife at length and reviewed various testing and diagnostics as well as therapy received and short term care plan. Levophed at 9mg/kg/min. S/p PD therapy this am. Patient with atelectasis on CXR this am, weaning FiO2 on MV with plan for SBT in am. Discussed care plan with renal team - Dr KJuleen Chinaconsidering HD.  02/16/20-patient did not pass breathing trial today, had family meeting with son and wife at bedside with RN present throughout. Plan to re-attempt SBT in AM.  02/17/20- patient had increased O2 requirement overnight and is more edematous with anasarca on exam this morning. I met with family and explained patient with numerous serious comorbid conditions and overall very poor prognosis long term. He was seen by cardiology and nephrology today. Awakening trial with no appreciable improvement in GCS, today plan to repeat SBT if FiO2 can be weaned to <40% and perform MRI brain w/wo to evaluate for ischemic changes post PEA arrest. 10/18signs of anoxic brain injury 10/19+seizures on EEG 10/20 no seizureson EEG but svere brain damage basedon exam, multiorgan failure 10/21 failed weaning trials 10/22 severe brain damage, severe resp failure 10/23 family has gathered, they wish for a one-way extubation with comfort measures.  CC  follow up respiratory failure  SUBJECTIVE Patient remains on the ventilator, off sedatives he is obtunded Prognosis is poor   BP 130/83    Pulse (!) 42    Temp (!) 102.1 F (38.9 C) (Axillary)    Resp (!) 26    Ht 6' (1.829 m)    Wt (!) 141.5 kg    SpO2 97%    BMI 42.31 kg/m    I/O last 3 completed  shifts: In: 5703.4 [I.V.:2996.1; NG/GT:2358; IV Piggyback:349.2] Out: 439 [Other:339; Stool:100] No intake/output data recorded.  SpO2: 97 % FiO2 (%): 50 %  Estimated body mass index is 42.31 kg/m as calculated from the following:   Height as of this encounter: 6' (1.829 m).   Weight as of this encounter: 141.5 kg.   REVIEW OF SYSTEMS  Patient is unable to provide review of systems due to obtundation and mechanically ventilated status     PHYSICAL EXAMINATION:  GENERAL: Obese male, intubated, mechanically ventilated, unresponsive HEAD: Normocephalic, atraumatic.  EYES: Pupils equal, round, reactive to light.  No scleral icterus.  MOUTH: Orotracheally intubated, OG in place NECK: Thick, Supple.  Trachea midline PULMONARY: Coarse breath sounds, symmetrical, synchronous with the ventilator CARDIOVASCULAR: S1 and S2.  Irregular rate and rhythm. No murmurs, rubs, or gallops.  GASTROINTESTINAL: Soft, non-distended.  Positive bowel sounds.  Tolerating tube feeds, PD catheter in situ MUSCULOSKELETAL: No swelling, clubbing, 1+ lower extremity edema.  NEUROLOGIC: obtunded, occasional when he opens eyes, blinks, does not track, no blink to threat. SKIN:intact,warm,dry chronic stasis changes lower extremities  MEDICATIONS: Medications were reviewed   CULTURE RESULTS   Recent Results (from the past 240 hour(s))  Culture, blood (routine x 2)     Status: None   Collection Time: 02/22/2020 12:29 PM   Specimen: BLOOD  Result Value Ref Range Status   Specimen Description BLOOD RIGHT ANTECUBITAL  Final   Special Requests   Final    BOTTLES DRAWN AEROBIC AND ANAEROBIC Blood Culture adequate volume  Culture   Final    NO GROWTH 5 DAYS Performed at [facility], [address] [place], [place] [address]    Report Status 02/19/2020 FINAL  Final  Culture, blood (routine x 2)     Status: None   Collection Time: 02/10/2020 12:30 PM   Specimen: BLOOD  Result Value Ref Range  Status   Specimen Description BLOOD BLOOD RIGHT HAND  Final   Special Requests   Final    BOTTLES DRAWN AEROBIC AND ANAEROBIC Blood Culture adequate volume   Culture   Final    NO GROWTH 5 DAYS Performed at [facility], [address] [place], [place] [address]    Report Status 02/19/2020 FINAL  Final  MRSA PCR Screening     Status: None   Collection Time: 02/26/2020 12:48 PM   Specimen: Nasal Mucosa; Nasopharyngeal  Result Value Ref Range Status   MRSA by PCR NEGATIVE NEGATIVE Final    Comment:        The GeneXpert MRSA Assay (FDA approved for NASAL specimens only), is one component of a comprehensive MRSA colonization surveillance program. It is not intended to diagnose MRSA infection nor to guide or monitor treatment for MRSA infections. Performed at [facility], [address] [place], [place] [address]   Culture, respiratory (non-expectorated)     Status: None   Collection Time: 02/18/20  2:11 PM   Specimen: Tracheal Aspirate; Respiratory  Result Value Ref Range Status   Specimen Description   Final    TRACHEAL ASPIRATE Performed at [facility], [address], [place], [place] [address]    Special Requests   Final    NONE Performed at [facility], [address] [place], [place] [address]    Gram Stain   Final    ABUNDANT WBC PRESENT, PREDOMINANTLY PMN ABUNDANT GRAM POSITIVE COCCI FEW YEAST    Culture   Final    FEW Normal respiratory flora-no Staph aureus or Pseudomonas seen Performed at [facility], [address], [place],  [address]    Report Status 02/21/2020 FINAL  Final          IMAGING    No results found.   Nutrition Status: Nutrition Problem: Inadequate oral intake Etiology: inability to eat (pt sedated and ventilated) Signs/Symptoms: NPO status       Indwelling Urinary Catheter continued, requirement due to   Reason to continue Indwelling Urinary Catheter strict  Intake/Output monitoring for hemodynamic instability   Central Line/ continued, requirement due to  Reason to continue [place] of central venous pressure or other hemodynamic parameters and poor IV access   Ventilator continued, requirement due to severe respiratory failure   Ventilator Sedation RASS 0 to -2      ASSESSMENT AND PLAN  SYNOPSIS ACUTE SEVERE CARDIAC ARREST DUE TO ACUTE ISCHEMIC CARDIOMYOPATHY Acute and severe hypoxic resp failure, complicated by aspiration pneumonia withFINDINGS STILLCONCERNING FOR ANOXIC BRAIN INJURY WITH MULTIORGAN FAILURE, +SEZIURES  Severe ACUTE Hypoxic and Hypercapnic Respiratory Failure Patient exhibiting findings consistent with severe anoxic encephalopathy Family has gathered and wishes to transition patient to comfort measures We will proceed when they are ready  ACUTE SYSTOLIC CARDIAC FAILURE- EF 45% Transition to comfort measures as above   Morbid obesity, possible OSA.   This issue added complexity to his management  END STAGE KIDNEY INJURY/Renal Failure Required both PD and HD Transitioning to comfort Discussed with renal    NEUROLOGIC Severe anoxic brain injury Has  been off benzodiazepines for over 72 hours On Precedex and fentanyl No response when taken off of all sedation EEG consistent with changes of severe anoxic encephalopathy Previously noted seizures controlled  ID Discontinued antibiotics Transitioning to comfort care   Family at bedside they request extubation and transition to comfort care if he develops any discomfort.  Level 3 follow-up  C. Derrill Kay, MD [facility]   *This note was dictated using voice recognition software/Dragon.  Despite best efforts to proofread, errors can occur which can change the meaning.  Any change was purely unintentional.

## 2020-02-23 NOTE — Progress Notes (Signed)
  Chaplain On-Call was paged at 12:30 with request to support patient's family as they await extubation procedure for the patient. Received update on patient's condition and family relationships from [employer].  Chaplain met patient's wife Jackelyn Poling, daughter-in-law Wells Guiles, the patient's parents and sons. Provided supportive listening as they described patient's difficult course of hospitalization, and grave condition leading to the plan for comfort care.   Chaplain provided much spiritual and emotional support and prayer.  Later this Chaplain returned to the Unit at 2:30 following the extubation, and continued to provide pastoral presence and support.  Clovis Sadae Arrazola M.Div., [facility]

## 2020-02-23 NOTE — Progress Notes (Signed)
Patrick Hurst  MRN: [id number]  DOB/AGE: 1956/02/06 64 y.o.  Primary Care Physician:Fisher, Kirstie Peri, MD  Admit date: 02/16/2020  Chief Complaint: No chief complaint on file.   S-Pt presented on  02/08/2020 with No chief complaint on file. . Patient is intubated, unable to offer any complaints.  Patient spouse was present in the room. As per discussion with patient spouse, the patient family has decided/opted for palliative care.  Patient family is deciding to extubate patient today.  Patient spouse told me that she understands the" patient has poor prognosis and she does not want him to suffer"  Medications   . sodium chloride   Intravenous Once  . amiodarone  200 mg Per Tube BID  . chlorhexidine gluconate (MEDLINE KIT)  15 mL Mouth Rinse BID  . Chlorhexidine Gluconate Cloth  6 each Topical Daily  . feeding supplement (NEPRO CARB STEADY)  1,000 mL Per Tube Q24H  . feeding supplement (PROSource TF)  90 mL Per Tube 5 X Daily  . fentaNYL (SUBLIMAZE) injection  100 mcg Intravenous Once  . gentamicin cream  1 application Topical Daily  . heparin  5,000 Units Subcutaneous Q8H  . insulin aspart  0-6 Units Subcutaneous Q4H  . ipratropium-albuterol  3 mL Nebulization Q4H  . mouth rinse  15 mL Mouth Rinse 10 times per day  . methylPREDNISolone (SOLU-MEDROL) injection  20 mg Intravenous Q24H  . metoprolol tartrate  50 mg Per Tube BID  . multivitamin  1 tablet Per Tube QHS  . valproic acid  5 mg/kg Per Tube TID         ROS: Unable to get any data   Physical Exam: Vital signs in last 24 hours: Temp:  [97.8 F (36.6 C)-102.1 F (38.9 C)] 102.1 F (38.9 C) (10/23 0545) Pulse Rate:  [29-156] 42 (10/23 0900) Resp:  [17-30] 26 (10/23 0900) BP: (90-212)/(58-96) 130/83 (10/22 1330) SpO2:  [85 %-100 %] 97 % (10/23 0900) Arterial Line BP: (83-169)/(48-85) 128/48 (10/23 0900) FiO2 (%):  [50 %] 50 % (10/23 0820) Weight:  [141.5 kg] 141.5 kg (10/23 0500) Weight change: 0.2 kg Last  BM Date: 02/22/20  Intake/Output from previous day: 10/22 0701 - 10/23 0700 In: 2427.6 [I.V.:1387.1; NG/GT:890.5; IV Piggyback:150.1] Out: 439 [Stool:100] No intake/output data recorded.   Physical Exam: General-critically ill appearing pt is intubated  HEENT-ET tube in situ  Resp-breath sounds present bilaterally, rhonchi present  CVS- S1S2 regular in rate and rhythm, patient is tachycardic  GIT- BS+, soft, Non tender , Non distended, PD catheter in situ  EXT-1+ LE Edema, no cyanosis  Access- tunneled cath &  right AV fistula  Lab Results:  CBC  Recent Labs    02/22/20 0423 02/03/2020 0417  WBC 6.8 6.9  HGB 8.1* 7.6*  HCT 26.3* 24.5*  PLT 85* 84*    BMET  Recent Labs    02/22/20 0423  NA 138  K 4.9  CL 96*  CO2 23  GLUCOSE 265*  BUN 109*  CREATININE 10.65*  CALCIUM 6.9*      Most recent Creatinine trend  Lab Results  Component Value Date   CREATININE 10.65 (H) 02/22/2020   CREATININE 12.23 (H) 02/20/2020   CREATININE 15.72 (H) 02/19/2020      MICRO   Recent Results (from the past 240 hour(s))  Culture, blood (routine x 2)     Status: None   Collection Time: 02/04/2020 12:29 PM   Specimen: BLOOD  Result Value Ref Range Status   Specimen  Description BLOOD RIGHT ANTECUBITAL  Final   Special Requests   Final    BOTTLES DRAWN AEROBIC AND ANAEROBIC Blood Culture adequate volume   Culture   Final    NO GROWTH 5 DAYS Performed at [facility], [address] [place], [place] [address]    Report Status 02/19/2020 FINAL  Final  Culture, blood (routine x 2)     Status: None   Collection Time: 02/26/2020 12:30 PM   Specimen: BLOOD  Result Value Ref Range Status   Specimen Description BLOOD BLOOD RIGHT HAND  Final   Special Requests   Final    BOTTLES DRAWN AEROBIC AND ANAEROBIC Blood Culture adequate volume   Culture   Final    NO GROWTH 5 DAYS Performed at [facility], [address] [place], [place] [address]    Report  Status 02/19/2020 FINAL  Final  MRSA PCR Screening     Status: None   Collection Time: 02/11/2020 12:48 PM   Specimen: Nasal Mucosa; Nasopharyngeal  Result Value Ref Range Status   MRSA by PCR NEGATIVE NEGATIVE Final    Comment:        The GeneXpert MRSA Assay (FDA approved for NASAL specimens only), is one component of a comprehensive MRSA colonization surveillance program. It is not intended to diagnose MRSA infection nor to guide or monitor treatment for MRSA infections. Performed at [facility], [address] [place], [place] [address]   Culture, respiratory (non-expectorated)     Status: None   Collection Time: 02/18/20  2:11 PM   Specimen: Tracheal Aspirate; Respiratory  Result Value Ref Range Status   Specimen Description   Final    TRACHEAL ASPIRATE Performed at [facility], [address], [place], [place] [address]    Special Requests   Final    NONE Performed at [facility], [address] [place], [place] [address]    Gram Stain   Final    ABUNDANT WBC PRESENT, PREDOMINANTLY PMN ABUNDANT GRAM POSITIVE COCCI FEW YEAST    Culture   Final    FEW Normal respiratory flora-no Staph aureus or Pseudomonas seen Performed at [facility], [address] [place], [place] [address]    Report Status 02/21/2020 FINAL  Final         Impression:   Patrick Hurst is a 64 y.o. white male with end stage renal disease on peritoneal dialysis, hepatic cirrhosis, congestive heart failure, COPD, atrial fibrillation, depression, GERD, sleep apnea, and gout who was admitted after cardiac arrest prior to scheduled colonoscopy on 02/20/2020. Patient currently admitted to [facility] ICU requiring intubation, mechanical ventilation and vasopressors.  1)Renal    End-stage renal disease Patient was on peritoneal dialysis. Patient was transitioned to hemodialysis Patient did not tolerate treatment well. We will follow conservatively for  now No need for renal replacement therapy today  2) hypotension Patient was earlier on vasopressors Blood pressure stable    3)Anemia of chronic disease  CBC Latest Ref Rng & Units 02/25/2020 02/22/2020 02/21/2020  WBC 4.0 - 10.5 K/uL 6.9 6.8 5.9  Hemoglobin 13.0 - 17.0 g/dL 7.6(L) 8.1(L) 7.8(L)  Hematocrit 39 - 52 % 24.5(L) 26.3(L) 24.9(L)  Platelets 150 - 400 K/uL 84(L) 85(L) 90(L)       HGb is not at goal (9--11) Patient is on Epogen protocol Patient did receive PRBC earlier   4) Secondary hyperparathyroidism -CKD Mineral-Bone Disorder    Lab Results  Component Value Date  PTH 222 (H) 12/27/2019   CALCIUM 6.9 (L) 02/22/2020   CAION 0.97 (L) 11/08/2019   PHOS 8.7 (H) 02/12/2020    Secondary Hyperparathyroidism present Phosphorus is not at goal.   5) acute respiratory failure Patient is currently mechanically ventilated Patient is being followed by the pulmonary team  6) Electrolytes   BMP Latest Ref Rng & Units 02/22/2020 02/20/2020 02/19/2020  Glucose 70 - 99 mg/dL 265(H) 242(H) 305(H)  BUN 8 - 23 mg/dL 109(H) 80(H) 88(H)  Creatinine 0.61 - 1.24 mg/dL 10.65(H) 12.23(H) 15.72(H)  BUN/Creat Ratio 10 - 24 - - -  Sodium 135 - 145 mmol/L 138 135 135  Potassium 3.5 - 5.1 mmol/L 4.9 5.8(H) 5.7(H)  Chloride 98 - 111 mmol/L 96(L) 91(L) 88(L)  CO2 22 - 32 mmol/L 23 22 20(L)  Calcium 8.9 - 10.3 mg/dL 6.9(L) 6.7(L) 6.8(L)     Sodium Normonatremic   Potassium Normokalemic Patient was hyperkalemic earlier   7)Acid base  Co2 at goal  8) palliative care Patient family is considering comfort care    Plan:  No need for renal placement therapy today We will follow conservatively      Batsheva Stevick s [facility] 02/21/2020, 9:55 AM

## 2020-02-23 NOTE — Progress Notes (Signed)
Patient has been made comfort care. Order written to extubate.Patient extubated and placed on 2lpm [place].

## 2020-02-25 ENCOUNTER — Ambulatory Visit: Admission: RE | Admit: 2020-02-25 | Payer: Medicare Other | Source: Home / Self Care | Admitting: Gastroenterology

## 2020-02-25 ENCOUNTER — Encounter: Admission: RE | Payer: Self-pay | Source: Home / Self Care

## 2020-02-25 SURGERY — IMAGING PROCEDURE, GI TRACT, INTRALUMINAL, VIA CAPSULE

## 2020-03-03 NOTE — Death Summary Note (Signed)
DEATH SUMMARY   Patient Details  Name: Patrick Hurst MRN: [id number] DOB: [birth year]  Admission/Discharge Information   Admit Date:  2020/02/26  Date of Death:  03-06-2020  Time of Death:  19:24  Length of Stay: 9  Referring Physician: [name], MD   Reason(s) for Hospitalization  Cardiac arrest Acute Hypoxic & Hypercapnic Respiratory Failure Aspiration Pneumonia Acute Decompensated HFrEF ESRD Anoxic Brain Injury  Diagnoses  Preliminary cause of death:   Cardiac arrest Secondary Diagnoses (including complications and co-morbidities):  Active Problems:   Cardiac arrest (HCC)   Pressure injury of skin Acute Hypoxic & Hypercapnic Respiratory Failure Aspiration Pneumonia Acute Decompensated HFrEF ESRD Anoxic Brain Injury  Brief Hospital Course (including significant findings, care, treatment, and services provided and events leading to death)  Patrick Hurst is an 64 y.o. male with a PMHx of CHF, atrial fibrillation (on Eliquis), NASH cirrhosis, OSA and recent admission in August 2021 for acute hypoxic respiratory failure and symptomatic anemia, who had presented initially for an outpatient colonoscopy on February 26, 2023. During the procedure he experienced PEA arrest requiring 10 minutes of CPR prior to ROSC. He was intubated at that time. He was unresponsive post-arrest and TTM was started. He has been managed in the ICU since the arrest.  He has not regained consciousness, with no evidence for higher cortical function on exam.  Neurology was consulted.  MRI brain on "Sunday 10/17 revealed no acute abnormality. A remote right basal ganglia insult was noted.  EEG on 10/14 revealed very slow, low voltage background activity that was consistent with a medication effect, but could not rule out a severe diffuse cerebral disturbance as well, etiologically nonspecific. No epileptiform activity was noted.Follow up EEG's showed evidence of generalized epileptogenicity as well as severe  diffuse encephalopathy, nonspecific etiology but likely secondary to anoxic/hypoxic brain injury. No seizures were seen during the study. EEG appearssimilarto previous day.  Palliative Care was consulted to assist with goals of care.  Pt's family elected to proceed with one extubation and transition to comfort care on 02/27/2020.  He expired later in the evening on 02/12/2020.   Pertinent Labs and Studies  Significant Diagnostic Studies EEG  Result Date: 02/21/2020 Yadav, Priyanka O, MD     10" /21/2021  3:02 PM Patient Name:Patrick Hurst DGU:[id number] Epilepsy Attending:Priyanka Barbra Sarks Referring Physician/Provider:Dr Kerney Elbe Date:02/21/2020 Duration:26.49 mins  Patient history:64 year old male s/p cardiac arrest x 10 minutes, now unresponsive on the ventilator with depressed brainstem reflexes. He is s/p TTM. EEG to evaluate for seizure  Level of alertness:comatose  AEDs during EEG study: valproic acid  Technical aspects: This EEG study was done with scalp electrodes positioned according to the 10-20 International system of electrode placement. Electrical activity was acquired at a sampling rate of 500Hz  and reviewed with a high frequency filter of 70Hz  and a low frequency filter of 1Hz . EEG data were recorded continuously and digitally stored.  Description:EEG showed generalized periodic epileptiform discharges with overriding fast activity at 0.25-0.5Hz  as well as generalized spikes at 1 Hz Continuous generalized 3 to 5 Hz theta-delta slowing was also noted.Hyperventilation and photic stimulation were not performed.   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  No seizures were seen during the study.   EEG appears similar to previous day.    EEG  Result Date: 02/20/2020 Lora Havens, MD     02/20/2020  3:59 PM Patient Name: Patrick Hurst MRN: [id number] Epilepsy Attending: Lora Havens Referring Physician/Provider: Dr Kerney Elbe Date: 02/20/2020 Duration: 29.44 mins  Patient history: 64 year old male s/p cardiac arrest x 10 minutes, now unresponsive on the ventilator with depressed brainstem reflexes. He is s/p TTM. EEG to evaluate for seizure  Level of alertness:  comatose  AEDs during EEG study:  Versed, valproic acid  Technical aspects: This EEG study was done with scalp electrodes positioned according to the 10-20 International system of electrode placement. Electrical activity was acquired at a sampling rate of 500Hz  and reviewed with a high frequency filter of 70Hz  and a low frequency filter of 1Hz . EEG data were recorded continuously and digitally stored.  Description: EEG showed generalized periodic epileptiform discharges with overriding fast activity at 1-2Hz  as well as continuous generalized 3 to 5 Hz theta-delta slowing. Hyperventilation and photic stimulation were not performed.    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  No seizures were seen during the study.  EEG appears improved compared to previous day.  Dr Cheral Marker was notified.  Lora Havens   EEG  Result Date: 02/19/2020 Lora Havens, MD     02/19/2020  4:53 PM Patient Name: Patrick Hurst MRN: [id number] Epilepsy Attending: Lora Havens Referring Physician/Provider: Dr Flora Lipps Date: 02/19/2020 Duration: 24.51 mins Patient history: 64 year old male s/p cardiac arrest x 10 minutes, now unresponsive on the ventilator with depressed brainstem  reflexes. He is s/p TTM. EEG to evaluate for seizure Level of alertness:  comatose AEDs during EEG study: None Technical aspects: This EEG study was done with scalp electrodes positioned according to the 10-20 International system of electrode placement. Electrical activity was acquired at a sampling rate of 500Hz  and reviewed with a high frequency filter of 70Hz  and a low frequency filter of 1Hz . EEG data were recorded continuously and digitally stored. Description: EEG showed generalized periodic epileptiform discharges with overriding fast activity at 1-1.5hz  as well as generalized background suppression. Two seizures were noted at 1455 and 1504, lasting 45 seconds and 1.5 mins respectively. On video patient was noted to have sudden eye opening, upward gaze deviation and rhythmic arm twitching. Concomitant eeg showed generalized polyspikes.  Hyperventilation and photic stimulation were not performed.   ABNORMALITY - Myoclonic Seizure, generalized - Periodic epileptiform discharges with plus pattern, generalized - Background suppression, generalized IMPRESSION: This study showed two myoclonic seizures as described above with generalized onset, at 1455 and 1504, lasting 45 seconds and 1.5 mins respectively. 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 Dr Cheral Marker was notified. Lora Havens   DG Chest 1 View  Result Date: 02/18/2020 CLINICAL DATA:  Oxygen desaturation EXAM: CHEST  1 VIEW COMPARISON:  Yesterday FINDINGS: The endotracheal tube tip is at the clavicular heads. The enteric tube tip is not visualized beyond the lower esophagus. Dialysis catheter on the right with tip at the upper right atrium. Cardiopericardial enlargement. Increased density at the bases, especially on the left. No visible effusion or pneumothorax. These results will be called to the ordering clinician or representative by the Radiologist Assistant, and  communication documented in the PACS or [employer].  IMPRESSION: 1. The enteric tube tip appears to terminate at the lower esophagus, but certainty is limited by patient anatomy. Consider advancement and KUB. 2. Cardiomegaly with mild interval lower lobe opacification, atelectasis or infiltrate. Electronically Signed   By: Monte Fantasia M.D.   On: 02/18/2020 05:50   DG Abd 1 View  Result Date: 02/18/2020 CLINICAL DATA:  OG tube placement EXAM: ABDOMEN - 1 VIEW COMPARISON:  02/04/2020 FINDINGS: Enteric tube tip is in the left upper quadrant consistent with location in the upper stomach. There has been advancement since the previous study. Right central venous catheter with tip over the cavoatrial junction region. Visualized bowel are not abnormally distended. Vascular calcifications in the upper abdomen. Old left rib fractures. Consolidation and air bronchograms in the left lung base. IMPRESSION: Enteric tube tip is been advanced and now in satisfactory apparent position. Electronically Signed   By: Lucienne Capers M.D.   On: 02/18/2020 06:48   DG Abd 1 View  Result Date: 02/19/2020 CLINICAL DATA:  OG tube placement EXAM: ABDOMEN - 1 VIEW COMPARISON:  July 26, 2019 FINDINGS: The enteric tube projects over the gastric body. The visualized bowel gas pattern is nonspecific. IMPRESSION: The enteric tube projects over the gastric body. Electronically Signed   By: Constance Holster M.D.   On: 02/18/2020 17:55   CT HEAD WO CONTRAST  Result Date: 02/02/2020 CLINICAL DATA:  Status post cardiac arrest EXAM: CT HEAD WITHOUT CONTRAST TECHNIQUE: Contiguous axial images were obtained from the base of the skull through the vertex without intravenous contrast. COMPARISON:  March 13, 2012 head CT and brain MRI. FINDINGS: Brain: Ventricles and sulci are normal in size and configuration. There is no intracranial mass, hemorrhage, extra-axial fluid collection, or midline shift. There is preservation of  gray-white differentiation without evident cerebral edema. Brain parenchyma appears unremarkable. No acute infarct evident. There is a mild area of deep sulcal asymmetry in the right temporal region compared to the left, stable and a presumed anatomic variant. Vascular: No hyperdense vessel. There is calcification in each carotid siphon region. Skull: The bony calvarium appears intact. Sinuses/Orbits: Visualized paranasal sinuses are clear. Orbits appear symmetric bilaterally. Other: Mastoid air cells are clear. IMPRESSION: Preservation of gray-white differentiation without edema. No acute infarct. No mass or hemorrhage. Foci of arterial vascular calcification noted. Electronically Signed   By: Lowella Grip III M.D.   On: 02/02/2020 13:03   CT ANGIO CHEST PE W OR WO CONTRAST  Result Date: 02/18/2020 CLINICAL DATA:  Respiratory failure EXAM: CT ANGIOGRAPHY CHEST WITH CONTRAST TECHNIQUE: Multidetector CT imaging of the chest was performed using the standard protocol during bolus administration of intravenous contrast. Multiplanar CT image reconstructions and MIPs were obtained to evaluate the vascular anatomy. CONTRAST:  68mL OMNIPAQUE IOHEXOL 350 MG/ML SOLN COMPARISON:  11/19/2019 FINDINGS: Cardiovascular: Moderate coronary artery calcifications, most pronounced in the left anterior descending coronary artery. Aortic calcifications. No aneurysm. Mild cardiomegaly. Mediastinum/Nodes: No mediastinal, hilar, or axillary adenopathy. Endotracheal tube tip is in the midtrachea. Thyroid unremarkable. Lungs/Pleura: Lower lobe volume loss and consolidation bilaterally. This could reflect atelectasis, pneumonia, or aspiration. No effusions. Upper Abdomen: Subtle nodularity of the liver suggesting cirrhosis as seen on prior study. Prior cholecystectomy. Small amount of perihepatic ascites. Musculoskeletal: Chest wall soft tissues are unremarkable. No acute bony abnormality. Review of the MIP images confirms the above  findings. IMPRESSION: No evidence of pulmonary embolus. Cardiomegaly, coronary artery disease. Volume loss and dense consolidation in the lower lobes which could reflect atelectasis, pneumonia or  aspiration. Nodular contours of the liver suggest cirrhosis. Small amount of Peri hepatic ascites. Aortic Atherosclerosis (ICD10-I70.0). Electronically Signed   By: Rolm Baptise M.D.   On: 02/18/2020 17:46   MR BRAIN WO CONTRAST  Result Date: 02/17/2020 CLINICAL DATA:  Neuro deficit.  Stroke suspected. EXAM: MRI HEAD WITHOUT CONTRAST TECHNIQUE: Multiplanar, multiecho pulse sequences of the brain and surrounding structures were obtained without intravenous contrast. COMPARISON:  02/11/2020 head CT and prior.  03/13/2012 MRI head. FINDINGS: Brain: No diffusion-weighted signal abnormality. No intracranial hemorrhage. No midline shift, ventriculomegaly or extra-axial fluid collection. No mass lesion. Remote right basal ganglia insult. Vascular: Grossly preserved major intracranial flow voids. Skull and upper cervical spine: Normal marrow signal. Sinuses/Orbits: Sequela of bilateral lens replacement. Bilateral proptosis, unchanged. Mild to moderate pansinus mucosal thickening. Bilateral mastoid effusions. Other: None. IMPRESSION: No acute intracranial process. Remote right basal ganglia insult. Electronically Signed   By: Primitivo Gauze M.D.   On: 02/17/2020 15:02   DG Chest Port 1 View  Result Date: 02/22/2020 CLINICAL DATA:  Encounter for intubation EXAM: PORTABLE CHEST 1 VIEW COMPARISON:  02/20/2020 FINDINGS: Endotracheal tube approximately 11 cm above the carina. Recommend advancing 5 cm. NG tube enters the stomach with the tip not visualized. Right jugular central venous catheter tip in the cavoatrial junction. Improvement in bibasilar atelectasis. Mild vascular congestion without edema. Possible small pleural effusion bilaterally. IMPRESSION: Endotracheal tube 11 cm above the carina.  Recommend advancing 5  cm. Improved aeration in the lung bases.  Mild vascular congestion Electronically Signed   By: Franchot Gallo M.D.   On: 02/22/2020 10:54   DG Chest Port 1 View  Result Date: 02/20/2020 CLINICAL DATA:  Shortness of breath EXAM: PORTABLE CHEST 1 VIEW COMPARISON:  Two days ago FINDINGS: The endotracheal tube tip is at the clavicular heads. The enteric tube at least reaches the diaphragm. Dialysis catheter with tip at the upper cavoatrial junction. Increased hazy opacification of the bilateral lower chest compared to radiograph. By recent chest CT there is bilateral lower lobe collapse. Cardiomegaly. No pneumothorax or clear pulmonary edema. IMPRESSION: 1. Unremarkable hardware positioning. 2. Persistence of bilateral lower lobe collapse seen on recent chest CT. Electronically Signed   By: Monte Fantasia M.D.   On: 02/20/2020 05:22   DG Chest Port 1 View  Result Date: 02/17/2020 CLINICAL DATA:  Respiratory failure EXAM: PORTABLE CHEST 1 VIEW COMPARISON:  02/15/2020 FINDINGS: Unchanged support apparatus. Shallow lung inflation without focal airspace consolidation. Mild cardiomegaly. IMPRESSION: Shallow lung inflation without focal airspace consolidation. Electronically Signed   By: Ulyses Jarred M.D.   On: 02/17/2020 06:21   DG Chest Port 1 View  Result Date: 02/15/2020 CLINICAL DATA:  Respiratory failure. EXAM: PORTABLE CHEST 1 VIEW COMPARISON:  Chest x-ray 02/13/2020, CT chest 11/19/2019 FINDINGS: Endotracheal tube with tip approximately 8 cm above the carina. Enteric tube courses below diaphragm with tip and side port overlying the expected region the gastric lumen. PO contrast is noted partially opacifying the gastric lumen. Right chest wall dialysis catheter again noted with tip overlying the right atrium with tip near the inferior cavoatrial junction a (tip likely more cranially within the right atrium; however, due to low lung volumes tip is near the junction). The heart size and mediastinal  contours are unchanged. Low lung volumes. Vague patchy airspace opacities. Increased interstitial markings. No pleural effusion. No pneumothorax. No acute osseous abnormality. IMPRESSION: 1. Enteric tube with tip terminating 8 cm above the carina. Consider advancing by 2 cm. 2. Low  lung volumes with vague patchy airspace opacities that likely represent atelectasis. 3. Cardiomegaly with mild pulmonary edema. Electronically Signed   By: Iven Finn M.D.   On: 02/15/2020 02:28   DG Chest Port 1 View  Result Date: 02/28/2020 CLINICAL DATA:  Intubation EXAM: PORTABLE CHEST 1 VIEW COMPARISON:  12/27/2019 FINDINGS: There is a well-positioned tunneled dialysis catheter on the right. The endotracheal tube terminates above the carina and just below the thoracic inlet. The tube terminates approximately 8 cm above the carina. The enteric tube appears to extend below the left hemidiaphragm. The heart size is stable but enlarged. The lung volumes are low. There is vascular congestion with small bilateral pleural effusions and bibasilar atelectasis. There is no acute osseous abnormality. IMPRESSION: 1. Lines and tubes as above. 2. Low lung volumes with bibasilar atelectasis and small bilateral pleural effusions. 3. Cardiomegaly with vascular congestion. Electronically Signed   By: Constance Holster M.D.   On: 02/18/2020 17:54   EEG adult  Result Date: 02/26/2020 Alexis Goodell, MD     02/18/2020  3:29 PM ELECTROENCEPHALOGRAM REPORT Patient: AZIR MUZYKA       Room #: IC06A EEG No. ID: [id number] Age: 64 y.o.        Sex: male Requesting Physician: Kasa Report Date:  02/26/2020       Interpreting Physician: Alexis Goodell History: TOMIO KIRK is an 64 y.o. male with PEA arrest during colonoscopy Medications: Fentanyl, Propofol, Levophed, ASA, Conditions of Recording:  This is a 21 channel routine scalp EEG performed with bipolar and monopolar montages arranged in accordance to the international 10/20 system of  electrode placement. One channel was dedicated to EKG recording. The patient is in the intubated and sedated state. Description:  The background activity is very low voltage and often obscured by low voltage artifact and electrocardiogram artifact.   At times some very slow polymorphic delta activity could be identified.  No epileptiform activity is noted.  The patient was stimulated during the recording with no activation of the background rhythm noted.  Hyperventilation and intermittent photic stimulation were not performed. IMPRESSION: This electroencephalogram is characterized by very slow, low voltage background activity that is consistent with a medication effect, but can not rule out a severe diffuse cerebral disturbance as well, etiologically nonspecific.  Clinical correlation recommended.  No epileptiform activity is noted.  Alexis Goodell, MD Neurology 02/28/2020, 2:43 PM   ECHOCARDIOGRAM COMPLETE  Result Date: 02/06/2020    ECHOCARDIOGRAM REPORT   Patient Name:   DEMETRIS CAPELL Date of Exam: 02/29/2020 Medical Rec #:  [id number]      Height:       72.0 in Accession #:    [id number]     Weight:       289.2 lb Date of Birth:  [birth year]      BSA:          2.492 m Patient Age:    22 years       BP:           121/46 mmHg Patient Gender: M              HR:           88 bpm. Exam Location:  [facility] Procedure: 2D Echo, Cardiac Doppler and Color Doppler Indications:     Cardiac Arrest 146.9  History:         Patient has prior history of Echocardiogram examinations, most  recent 11/16/2019. CHF.  Sonographer:     Sherrie Sport RDCS (AE) Referring Phys:  [id number] [place] L [place] Diagnosing Phys: Ida Rogue MD  Sonographer Comments: Echo performed with patient supine and on artificial respirator. IMPRESSIONS  1. Left ventricular ejection fraction, by estimation, is 50 to 55%. The left ventricle has low normal function. The left ventricle has no regional wall motion abnormalities. There is  moderate left ventricular hypertrophy. Indeterminate diastolic filling  due to E-A fusion.  2. The aortic valve was not well visualized. Aortic valve regurgitation is not visualized. Mild to moderate aortic valve sclerosis/calcification is present, without any evidence of aortic stenosis. FINDINGS  Left Ventricle: Left ventricular ejection fraction, by estimation, is 50 to 55%. The left ventricle has low normal function. The left ventricle has no regional wall motion abnormalities. The left ventricular internal cavity size was normal in size. There is moderate left ventricular hypertrophy. Indeterminate diastolic filling due to E-A fusion. Right Ventricle: The right ventricular size is normal. No increase in right ventricular wall thickness. Right ventricular systolic function is normal. Tricuspid regurgitation signal is inadequate for assessing PA pressure. Left Atrium: Left atrial size was normal in size. Right Atrium: Right atrial size was normal in size. Pericardium: There is no evidence of pericardial effusion. Mitral Valve: The mitral valve is normal in structure. Mild to moderate mitral annular calcification. No evidence of mitral valve regurgitation. No evidence of mitral valve stenosis. Tricuspid Valve: The tricuspid valve is normal in structure. Tricuspid valve regurgitation is not demonstrated. No evidence of tricuspid stenosis. Aortic Valve: The aortic valve was not well visualized. Aortic valve regurgitation is not visualized. Mild to moderate aortic valve sclerosis/calcification is present, without any evidence of aortic stenosis. Aortic valve mean gradient measures 5.5 mmHg.  Aortic valve peak gradient measures 10.2 mmHg. Aortic valve area, by VTI measures 2.12 cm. Pulmonic Valve: The pulmonic valve was normal in structure. Pulmonic valve regurgitation is not visualized. No evidence of pulmonic stenosis. Aorta: The aortic root is normal in size and structure. Venous: The inferior vena cava is normal  in size with greater than 50% respiratory variability, suggesting right atrial pressure of 3 mmHg. IAS/Shunts: No atrial level shunt detected by color flow Doppler.  LEFT VENTRICLE PLAX 2D LVIDd:         4.64 cm  Diastology LVIDs:         2.94 cm  LV e' medial:    7.29 cm/s LV PW:         1.74 cm  LV E/e' medial:  12.2 LV IVS:        1.94 cm  LV e' lateral:   4.46 cm/s LVOT diam:     2.30 cm  LV E/e' lateral: 19.9 LV SV:         58 LV SV Index:   23 LVOT Area:     4.15 cm  LEFT ATRIUM             Index       RIGHT ATRIUM           Index LA Vol (A2C):   71.8 ml 28.81 ml/m RA Area:     27.80 cm LA Vol (A4C):   47.2 ml 18.94 ml/m RA Volume:   99.70 ml  40.01 ml/m LA Biplane Vol: 61.2 ml 24.56 ml/m  AORTIC VALVE                    PULMONIC VALVE AV Area (Vmax):  2.03 cm     PV Vmax:        0.93 m/s AV Area (Vmean):   1.97 cm     PV Peak grad:   3.4 mmHg AV Area (VTI):     2.12 cm     RVOT Peak grad: 5 mmHg AV Vmax:           160.00 cm/s AV Vmean:          106.000 cm/s AV VTI:            0.272 m AV Peak Grad:      10.2 mmHg AV Mean Grad:      5.5 mmHg LVOT Vmax:         78.30 cm/s LVOT Vmean:        50.300 cm/s LVOT VTI:          0.139 m LVOT/AV VTI ratio: 0.51  AORTA Ao Root diam: 3.60 cm MITRAL VALVE               TRICUSPID VALVE MV Area (PHT): 2.48 cm    TR Peak grad:   14.4 mmHg MV Decel Time: 306 msec    TR Vmax:        190.00 cm/s MV E velocity: 88.80 cm/s MV A velocity: 47.50 cm/s  SHUNTS MV E/A ratio:  1.87        Systemic VTI:  0.14 m                            Systemic Diam: 2.30 cm Ida Rogue MD Electronically signed by Ida Rogue MD Signature Date/Time: 02/29/2020/4:14:37 PM    Final    ECHOCARDIOGRAM LIMITED  Result Date: 02/18/2020    ECHOCARDIOGRAM LIMITED REPORT   Patient Name:   PERSHING SKIDMORE Date of Exam: 02/18/2020 Medical Rec #:  [id number]      Height:       72.0 in Accession #:    [id number]     Weight:       292.3 lb Date of Birth:  02/17/1956      BSA:          2.504 m  Patient Age:    58 years       BP:           Not listed in chart/Not listed in                                              chart mmHg Patient Gender: M              HR:           73 bpm. Exam Location:  [facility] Procedure: Cardiac Doppler, Color Doppler and Limited Echo Indications:     Cardiac Arrest 146.9  History:         Patient has prior history of Echocardiogram examinations, most                  recent 02/09/2020. Risk Factors:Sleep Apnea. Chronic diastolic                  CHF.  Sonographer:     Sherrie Sport RDCS (AE) Referring Phys:  Berry Diagnosing Phys: Bartholome Bill MD IMPRESSIONS  1. Left ventricular ejection fraction, by estimation, is 40 to  45%. The left ventricle has mild to moderately decreased function. The left ventricle demonstrates global hypokinesis. There is moderate left ventricular hypertrophy.  2. Right ventricular systolic function is normal. The right ventricular size is mildly enlarged.  3. Left atrial size was mildly dilated.  4. The mitral valve was not well visualized. Trivial mitral valve regurgitation.  5. The aortic valve was not well visualized. Aortic valve regurgitation is trivial. FINDINGS  Left Ventricle: Left ventricular ejection fraction, by estimation, is 40 to 45%. The left ventricle has mild to moderately decreased function. The left ventricle demonstrates global hypokinesis. There is moderate left ventricular hypertrophy. Right Ventricle: The right ventricular size is mildly enlarged. Right vetricular wall thickness was not well visualized. Right ventricular systolic function is normal. Left Atrium: Left atrial size was mildly dilated. Right Atrium: Right atrial size was normal in size. Pericardium: There is no evidence of pericardial effusion. Mitral Valve: The mitral valve was not well visualized. Trivial mitral valve regurgitation. Tricuspid Valve: The tricuspid valve is not well visualized. Tricuspid valve regurgitation is mild. Aortic Valve: The aortic  valve was not well visualized. Aortic valve regurgitation is trivial. Pulmonic Valve: The pulmonic valve was not well visualized. Pulmonic valve regurgitation is trivial. IAS/Shunts: The interatrial septum was not assessed. LEFT VENTRICLE PLAX 2D LVIDd:         4.86 cm      Diastology LVIDs:         4.18 cm      LV e' medial:    5.66 cm/s LV PW:         1.81 cm      LV E/e' medial:  12.9 LV IVS:        2.12 cm      LV e' lateral:   11.90 cm/s LVOT diam:     2.30 cm      LV E/e' lateral: 6.1 LVOT Area:     4.15 cm  LV Volumes (MOD) LV vol d, MOD A2C: 162.0 ml LV vol d, MOD A4C: 178.0 ml LV vol s, MOD A2C: 84.5 ml LV vol s, MOD A4C: 111.0 ml LV SV MOD A2C:     77.5 ml LV SV MOD A4C:     178.0 ml LV SV MOD BP:      80.7 ml LEFT ATRIUM             Index       RIGHT ATRIUM           Index LA diam:        4.00 cm 1.60 cm/m  RA Area:     29.00 cm LA Vol (A2C):   75.3 ml 30.08 ml/m RA Volume:   85.70 ml  34.23 ml/m LA Vol (A4C):   96.6 ml 38.59 ml/m LA Biplane Vol: 88.2 ml 35.23 ml/m                        PULMONIC VALVE AORTA                 PV Vmax:        0.59 m/s Ao Root diam: 3.30 cm PV Peak grad:   1.4 mmHg                       RVOT Peak grad: 3 mmHg  MITRAL VALVE               TRICUSPID VALVE MV Area (PHT):  2.52 cm    TR Peak grad:   14.6 mmHg MV Decel Time: 301 msec    TR Vmax:        191.00 cm/s MV E velocity: 72.80 cm/s MV A velocity: 49.00 cm/s  SHUNTS MV E/A ratio:  1.49        Systemic Diam: 2.30 cm Bartholome Bill MD Electronically signed by Bartholome Bill MD Signature Date/Time: 02/18/2020/1:21:04 PM    Final     Microbiology Recent Results (from the past 240 hour(s))  Culture, blood (routine x 2)     Status: None   Collection Time: 02/29/2020 12:29 PM   Specimen: BLOOD  Result Value Ref Range Status   Specimen Description BLOOD RIGHT ANTECUBITAL  Final   Special Requests   Final    BOTTLES DRAWN AEROBIC AND ANAEROBIC Blood Culture adequate volume   Culture   Final    NO GROWTH 5  DAYS Performed at [facility], [address] [place], [place] [address]    Report Status 02/19/2020 FINAL  Final  Culture, blood (routine x 2)     Status: None   Collection Time: 02/19/2020 12:30 PM   Specimen: BLOOD  Result Value Ref Range Status   Specimen Description BLOOD BLOOD RIGHT HAND  Final   Special Requests   Final    BOTTLES DRAWN AEROBIC AND ANAEROBIC Blood Culture adequate volume   Culture   Final    NO GROWTH 5 DAYS Performed at [facility], [address] [place], [place] [address]    Report Status 02/19/2020 FINAL  Final  MRSA PCR Screening     Status: None   Collection Time: 02/18/2020 12:48 PM   Specimen: Nasal Mucosa; Nasopharyngeal  Result Value Ref Range Status   MRSA by PCR NEGATIVE NEGATIVE Final    Comment:        The GeneXpert MRSA Assay (FDA approved for NASAL specimens only), is one component of a comprehensive MRSA colonization surveillance program. It is not intended to diagnose MRSA infection nor to guide or monitor treatment for MRSA infections. Performed at [facility], [address] [place], [place] [address]   Culture, respiratory (non-expectorated)     Status: None   Collection Time: 02/18/20  2:11 PM   Specimen: Tracheal Aspirate; Respiratory  Result Value Ref Range Status   Specimen Description   Final    TRACHEAL ASPIRATE Performed at [facility], [address] [place], [place] [address]    Special Requests   Final    NONE Performed at [facility], [address] [place], [place] [address]    Gram Stain   Final    ABUNDANT WBC PRESENT, PREDOMINANTLY PMN ABUNDANT GRAM POSITIVE COCCI FEW YEAST    Culture   Final    FEW Normal respiratory flora-no Staph aureus or Pseudomonas seen Performed at [facility], [address], [place], [place] [address]    Report Status 02/21/2020 FINAL  Final    Lab Basic Metabolic Panel: Recent Labs  Lab 02/18/20 0351  02/18/20 0351 02/18/20 1454 02/18/20 2300 02/19/20 0450 02/20/20 0400 02/21/20 0333 02/22/20 0423 02/19/2020 0417  NA 134*  --   --  136 135 135  --  138  --   K 5.6*   < > 5.8* 6.0* 5.7* 5.8*  --  4.9  --   CL 88*  --   --  92* 88* 91*  --  96*  --   CO2 20*  --   --  19* 20* 22  --  23  --   GLUCOSE 195*  --   --  305* 305* 242*  --  265*  --   BUN 68*  --   --  85* 88* 80*  --  109*  --   CREATININE 15.33*  --   --  15.29* 15.72* 12.23*  --  10.65*  --   CALCIUM 6.9*  --   --  6.7* 6.8* 6.7*  --  6.9*  --   MG 2.3   < >  --   --  2.4 2.2 2.4 2.5* 2.2  PHOS 12.7*   < >  --   --  14.1* 11.7* 10.4* 11.1* 8.7*   < > = values in this interval not displayed.   Liver Function Tests: Recent Labs  Lab 02/18/20 2300 02/22/20 0423  AST 31 72*  ALT 15 30  ALKPHOS 96 111  BILITOT 1.1 0.7  PROT 6.4* 6.3*  ALBUMIN 2.6* 2.4*   No results for input(s): LIPASE, AMYLASE in the last 168 hours. Recent Labs  Lab 02/19/20 1715  AMMONIA 29   CBC: Recent Labs  Lab 02/19/20 0450 02/20/20 0400 02/21/20 0333 02/22/20 0423 02/18/2020 0417  WBC 4.7 4.9 5.9 6.8 6.9  NEUTROABS 4.0 4.0 4.8 5.5 5.6  HGB 7.1* 6.3* 7.8* 8.1* 7.6*  HCT 20.9* 20.4* 24.9* 26.3* 24.5*  MCV 93.3 97.1 92.2 91.0 91.4  PLT 94* 91* 90* 85* 84*   Cardiac Enzymes: Recent Labs  Lab 02/20/20 0400  CKTOTAL 1,869*   Sepsis Labs: Recent Labs  Lab 02/18/20 0351 02/19/20 0450 02/20/20 0400 02/21/20 0333 02/22/20 0423 02/08/2020 0417  PROCALCITON 6.32  --   --   --   --   --   WBC 6.9   < > 4.9 5.9 6.8 6.9   < > = values in this interval not displayed.    Procedures/Operations  10/14: Colonoscopy 10/14: Endotracheal intubation 10/14: Right Femoral CVC placed       Darel Hong, [facility] [place] [facility] Pager: [phone]  Bradly Bienenstock 02/05/2020, 7:32 PM

## 2020-03-03 DEATH — deceased

## 2020-03-14 ENCOUNTER — Ambulatory Visit: Payer: Self-pay | Admitting: Family Medicine

## 2020-04-04 ENCOUNTER — Ambulatory Visit: Payer: Self-pay | Admitting: Family Medicine

## 2020-04-22 ENCOUNTER — Ambulatory Visit: Payer: Medicare Other | Admitting: Family

## 2020-07-07 ENCOUNTER — Other Ambulatory Visit: Payer: Commercial Managed Care - PPO

## 2020-07-07 ENCOUNTER — Ambulatory Visit: Payer: Commercial Managed Care - PPO | Admitting: Internal Medicine

## 2021-04-11 IMAGING — CR DG CHEST 2V
1 series · 2 of 2 positions shown · non-contrast
Comparison: CT chest dated August 03, 2018. Chest x-ray dated
June 25, 2018.

CLINICAL DATA: Shortness of breath and productive cough for the
past week.

EXAM:
CHEST - 2 VIEW

[Series 2: w chest pa · 0.14mm/px · 2 of 2 slices shown]
[im 1/2]
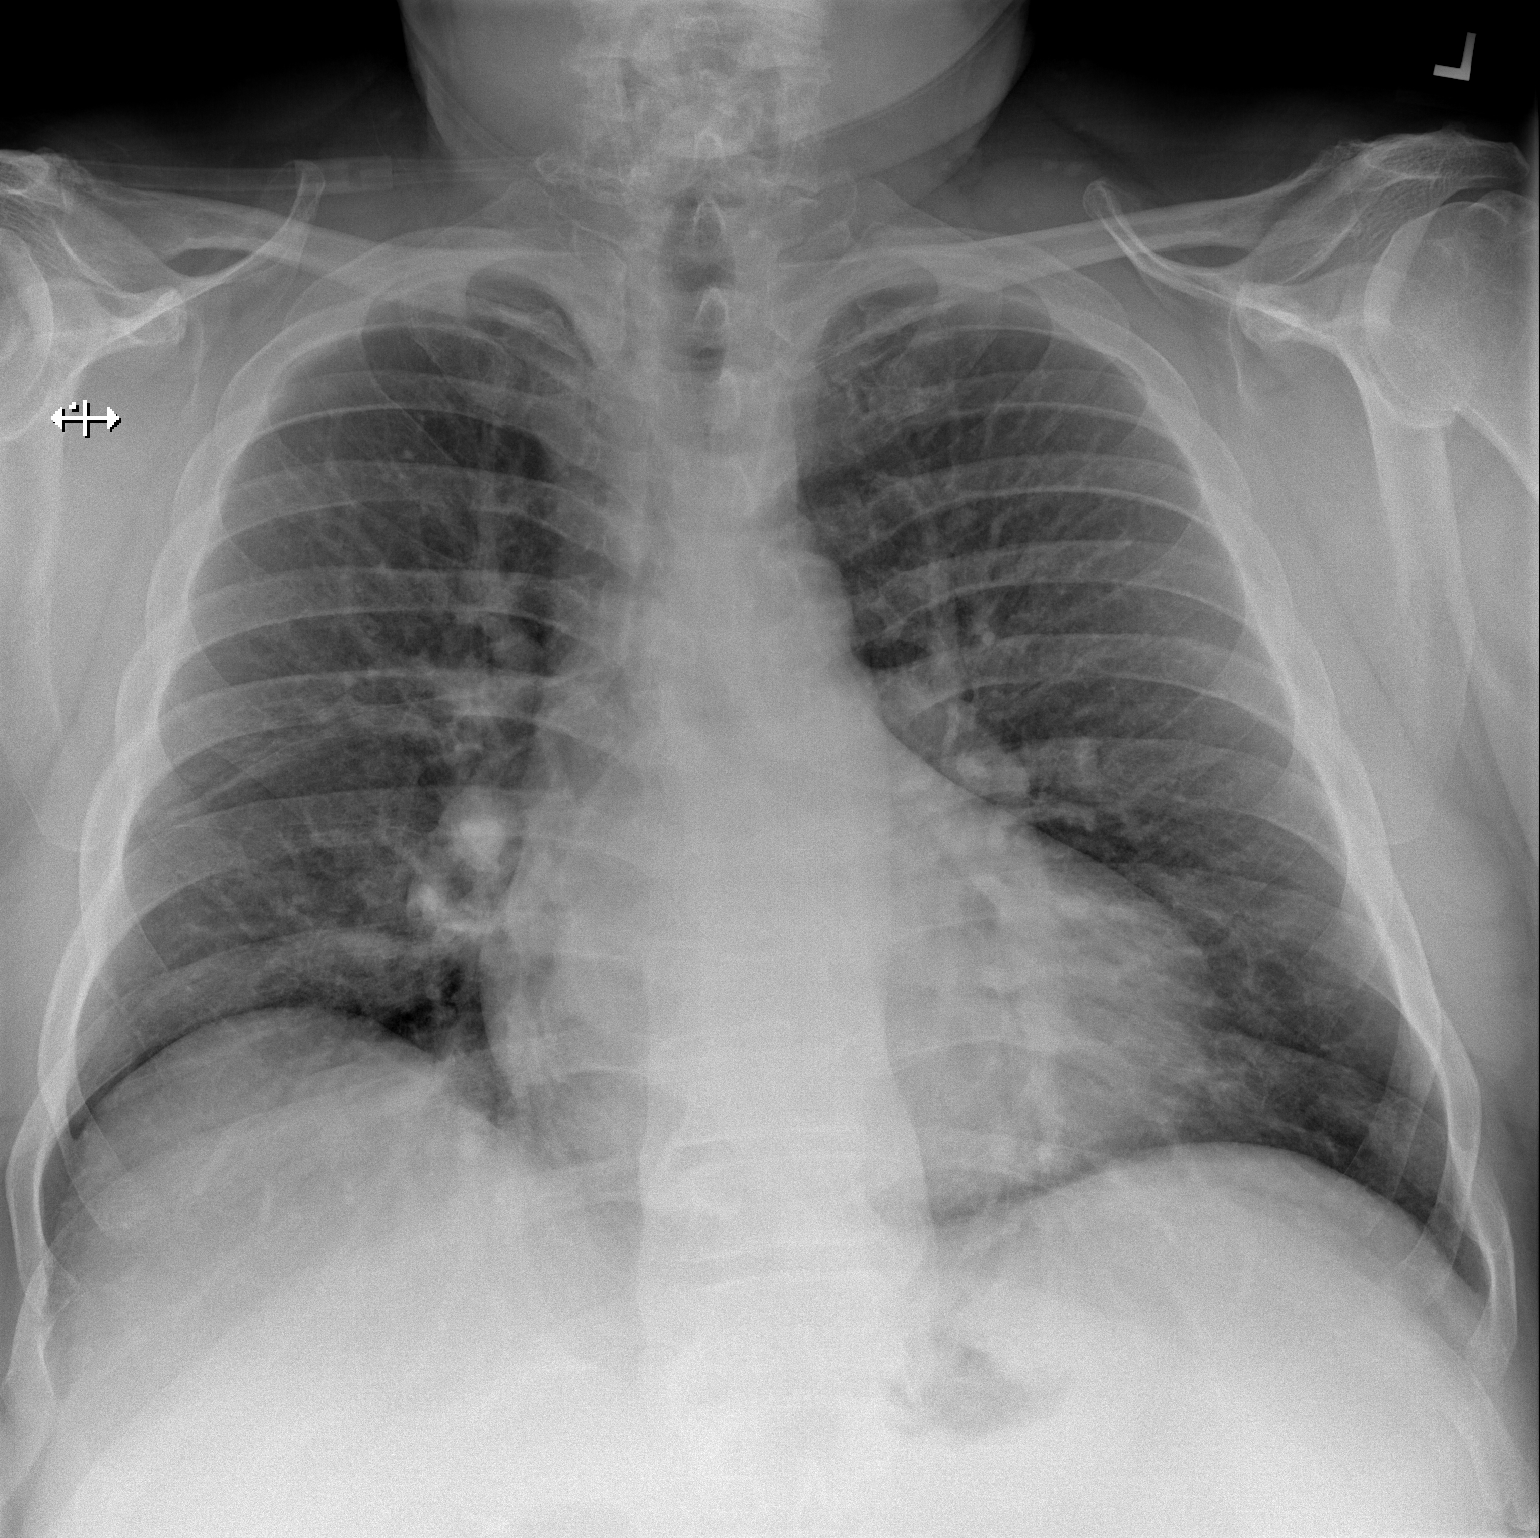
[im 2/2]
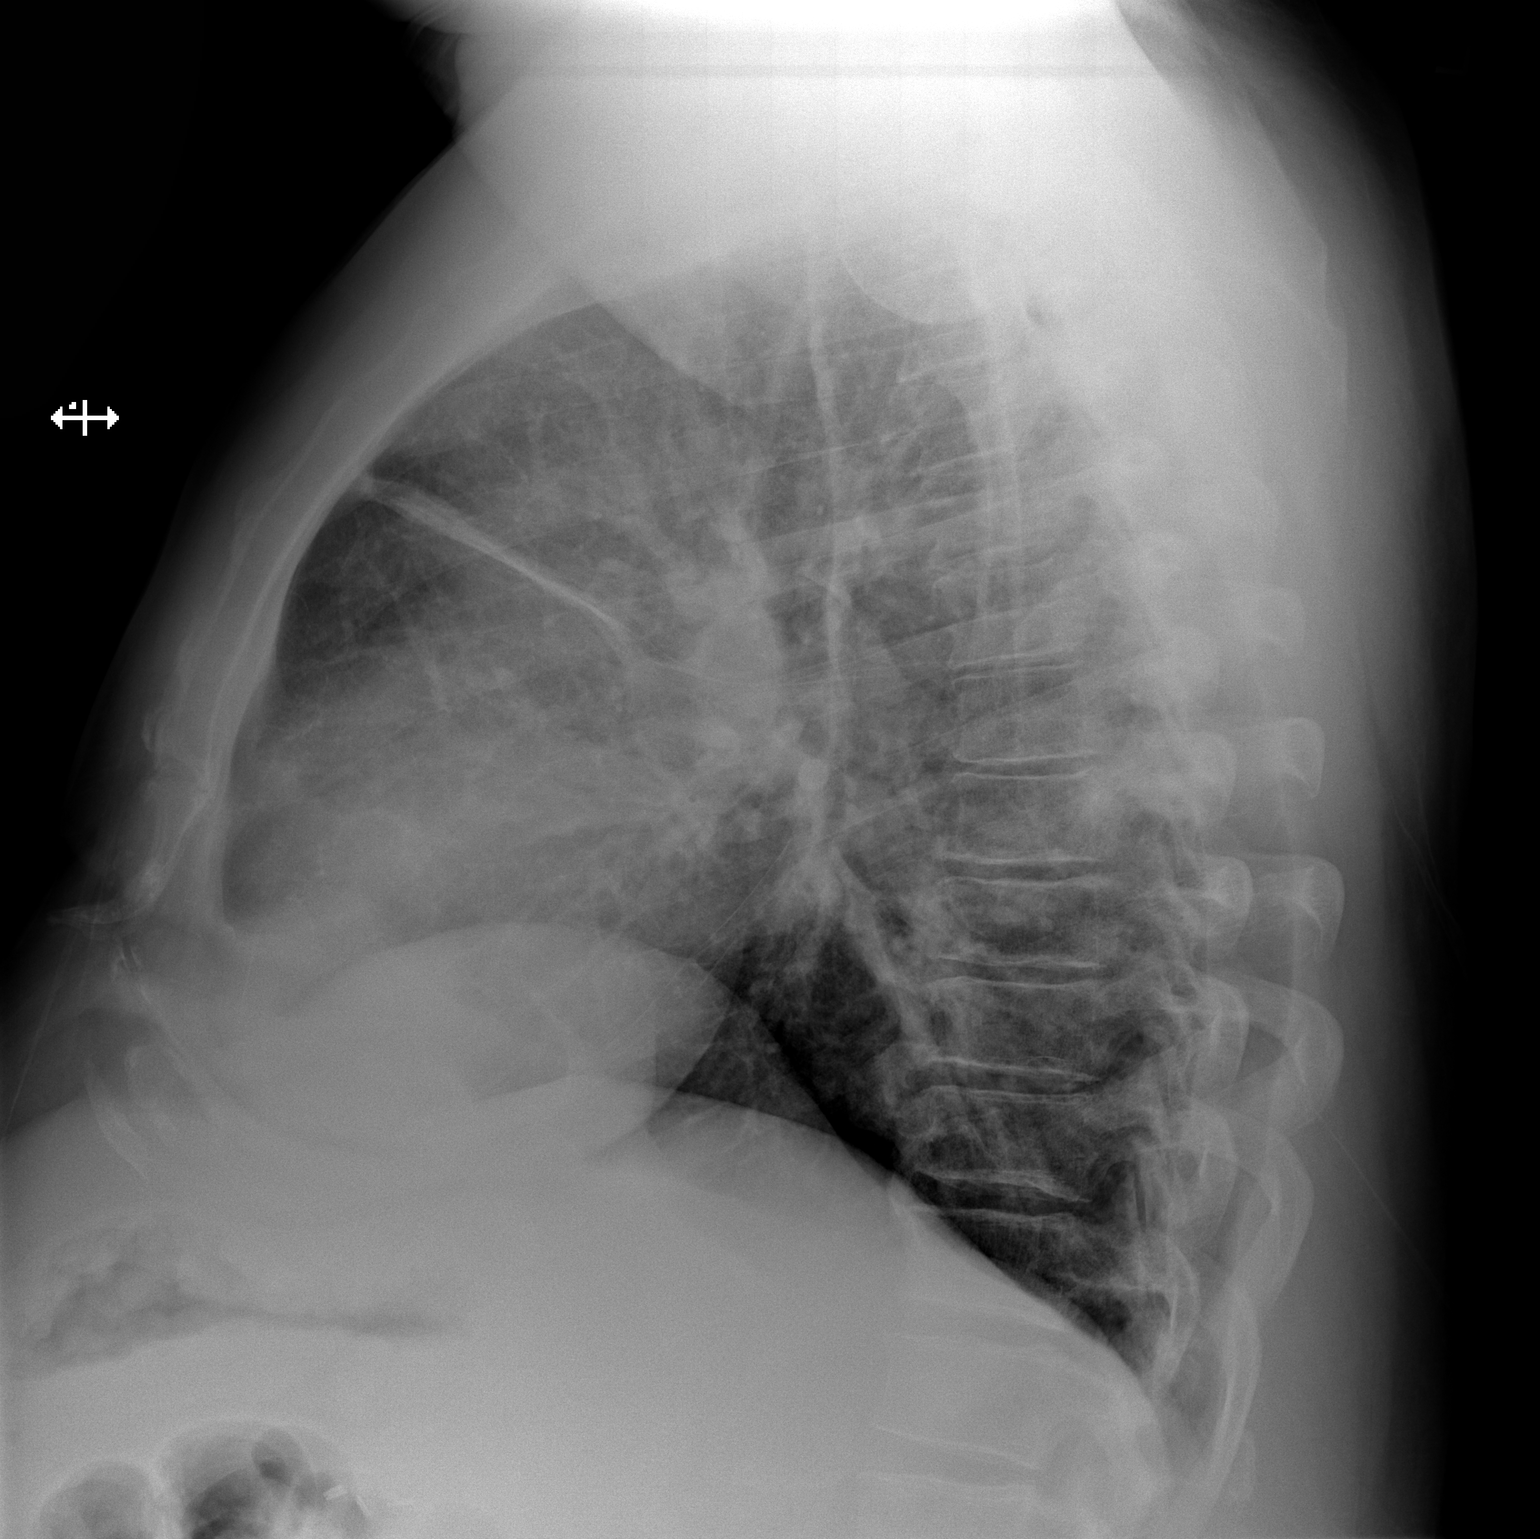

[2 of 2 positions shown; findings below may reference images not displayed]

FINDINGS: The heart is at the upper limits of normal in size. Normal pulmonary
vascularity. No focal consolidation, pleural effusion, or
pneumothorax. Unchanged scarring in the right upper lobe along the
minor fissure. No acute osseous abnormality.
IMPRESSION: No active cardiopulmonary disease.

## 2021-04-13 IMAGING — CR DG CHEST 2V
1 series · 2 of 2 positions shown · non-contrast
Comparison: 03/01/2019

CLINICAL DATA: Shortness of breath. Pneumonia. Congestive heart
failure and chronic kidney disease.

EXAM:
CHEST - 2 VIEW

[Series 1: dg chest 2 view · 0.14mm/px · 2 of 2 slices shown]
[im 1/2]
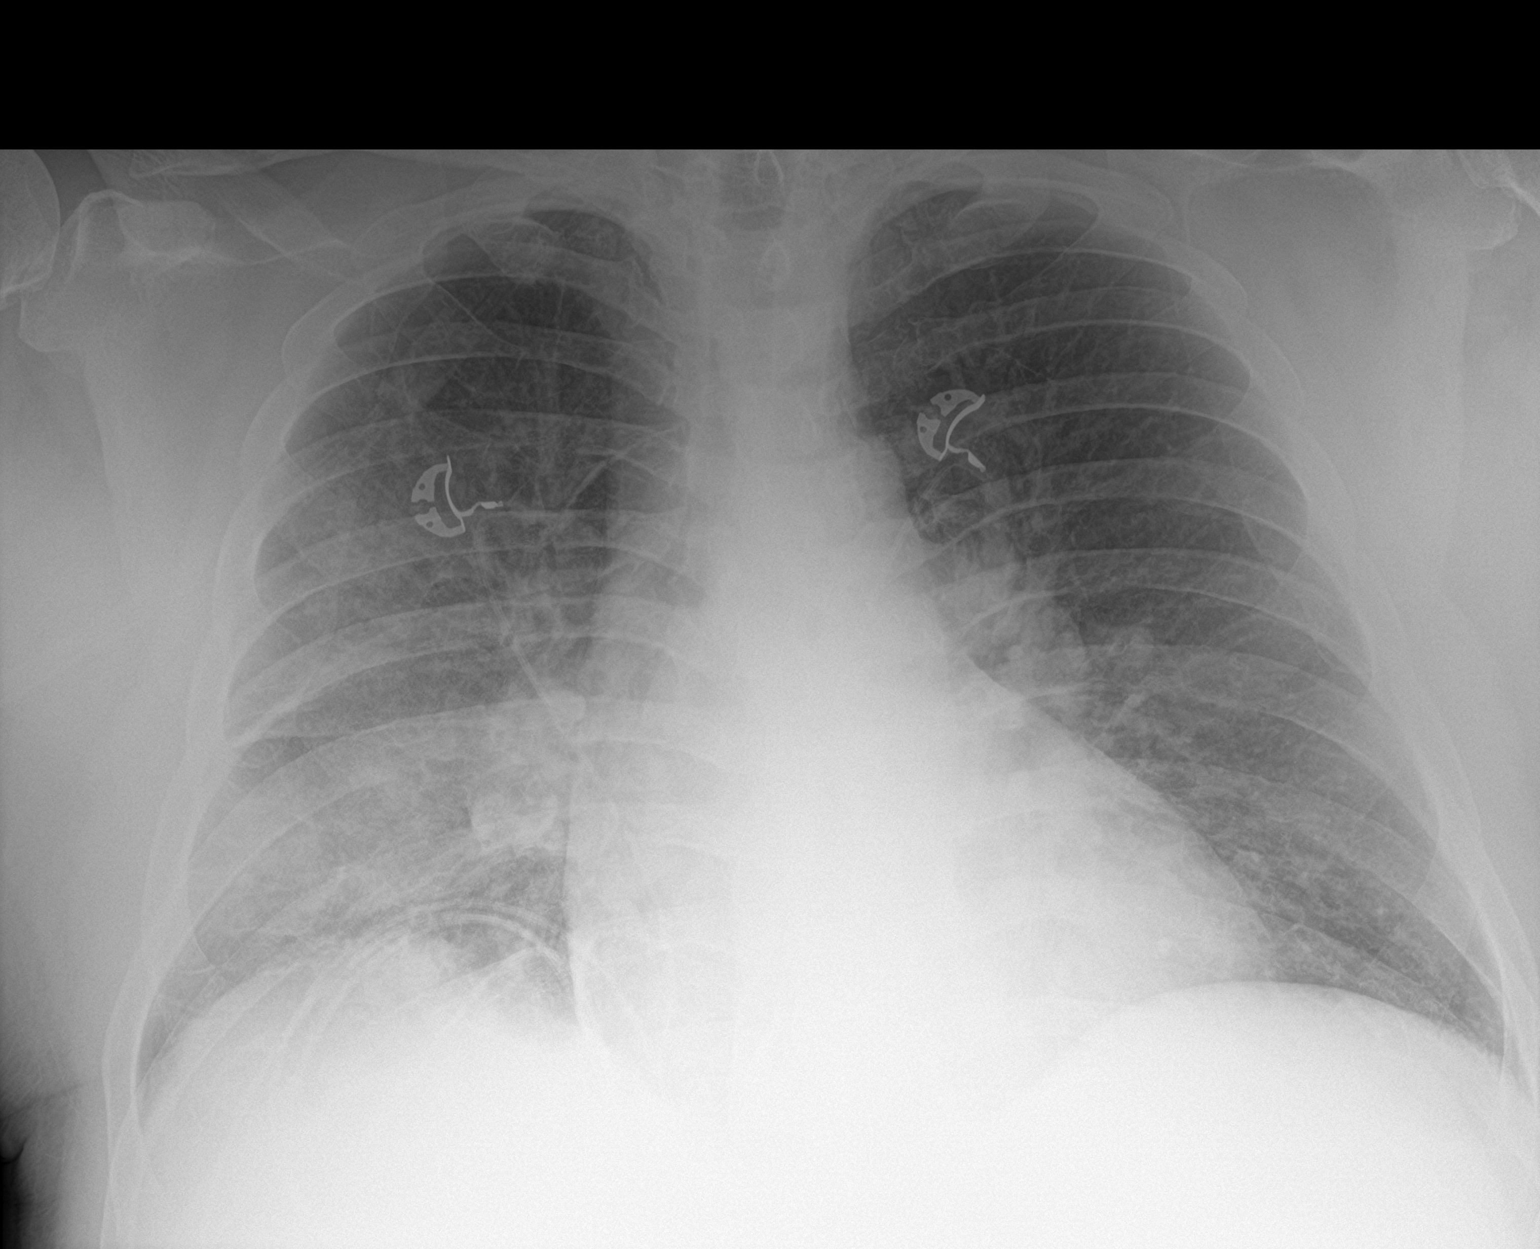
[im 2/2]
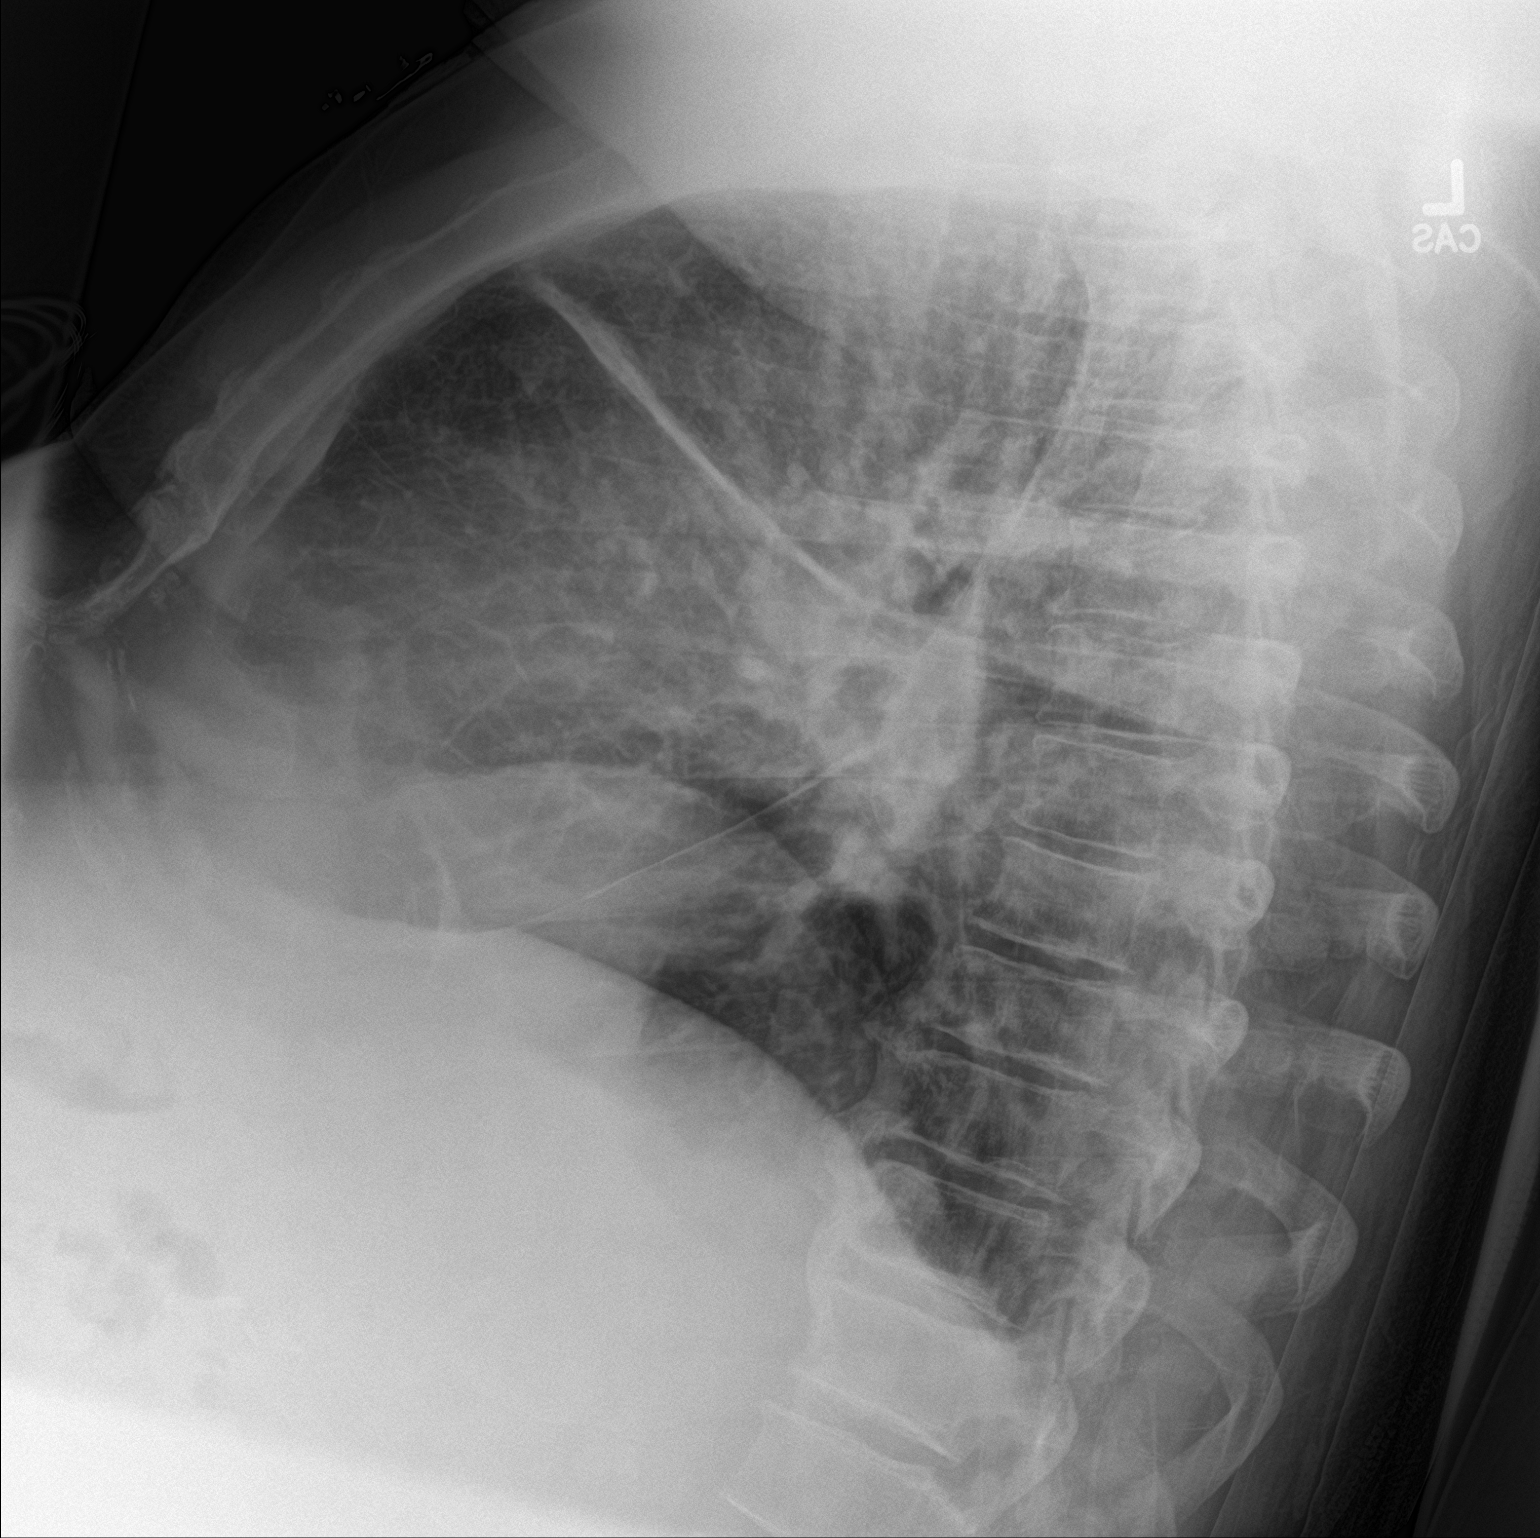

[2 of 2 positions shown; findings below may reference images not displayed]

FINDINGS: The heart size and mediastinal contours are within normal limits.
New asymmetric airspace disease is seen in the right mid and lower
lung, suspicious for pneumonia. Left lung remains clear. No evidence
of pleural effusion.
IMPRESSION: New asymmetric airspace disease in right mid and lower lung,
suspicious for pneumonia.

## 2021-04-14 IMAGING — DX DG CHEST 1V PORT
2 series · 2 of 2 positions shown · non-contrast
Comparison: March 03, 2019

CLINICAL DATA: Respiratory distress

EXAM:
PORTABLE CHEST 1 VIEW

[chest ap (1 of 2)]
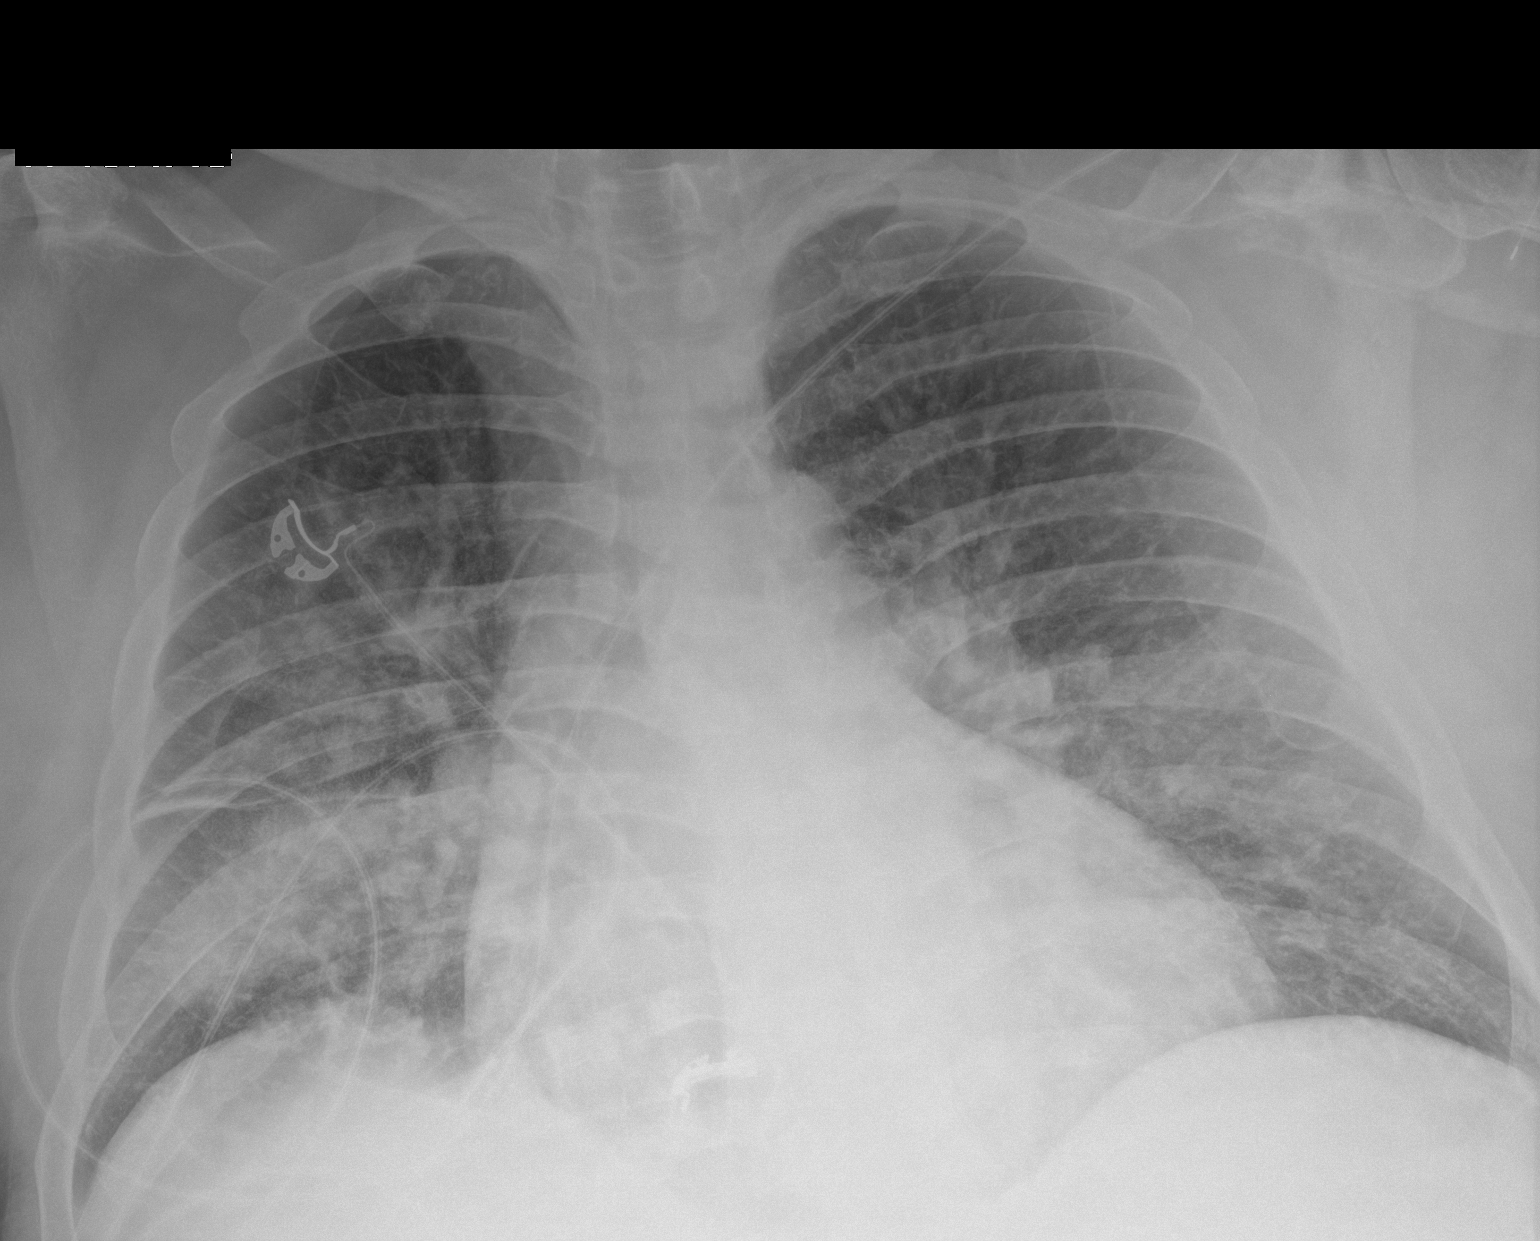

[chest ap (2 of 2)]
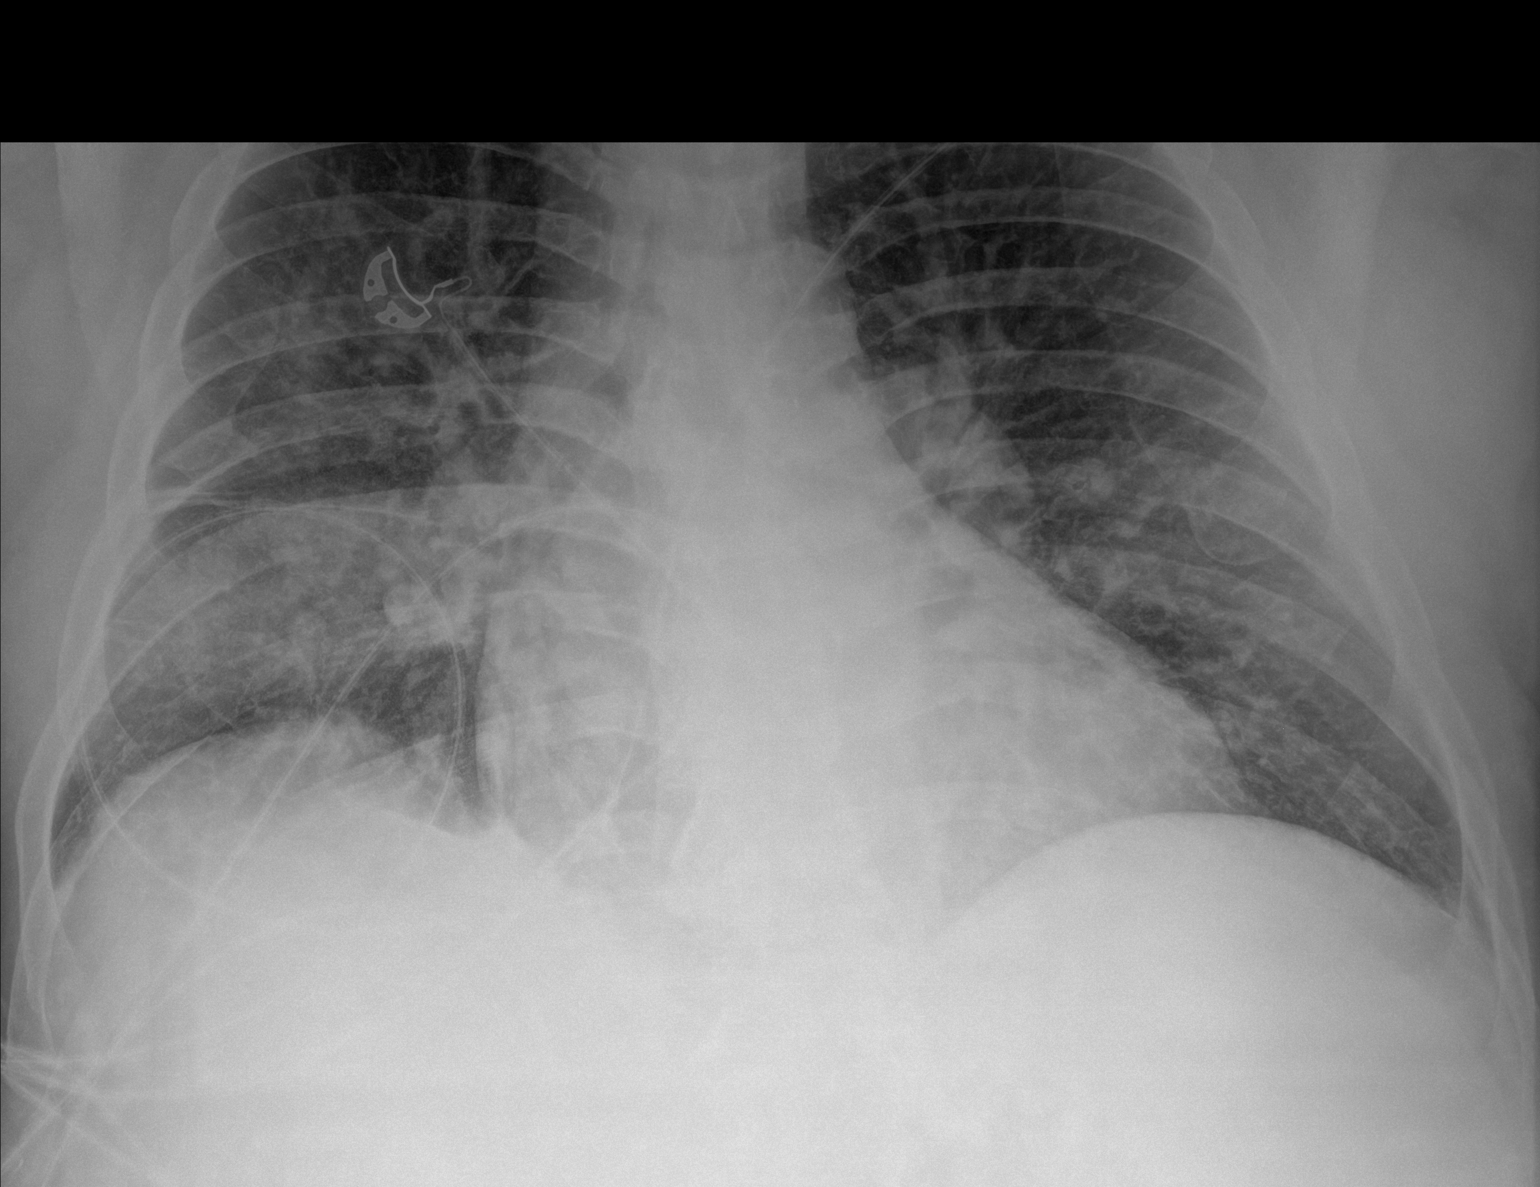

[2 of 2 positions shown; findings below may reference images not displayed]

FINDINGS: Increasing interstitial opacities throughout the left lung. More
focal opacities are developing in the right base and right perihilar
region. Cardiomegaly. The hila and mediastinum are unchanged. No
pneumothorax. No other changes.
IMPRESSION: 1. Developing focal infiltrate in the right perihilar region and
right base worrisome for an infectious process/pneumonia. Mild
increased interstitial markings in the left may represent pulmonary
venous congestion.

## 2021-04-17 IMAGING — DX DG CHEST 1V PORT
1 series · 1 of 1 positions shown · non-contrast
Comparison: 03/04/2019

CLINICAL DATA: Pulmonary infiltrate

EXAM:
PORTABLE CHEST 1 VIEW

[chest ap]
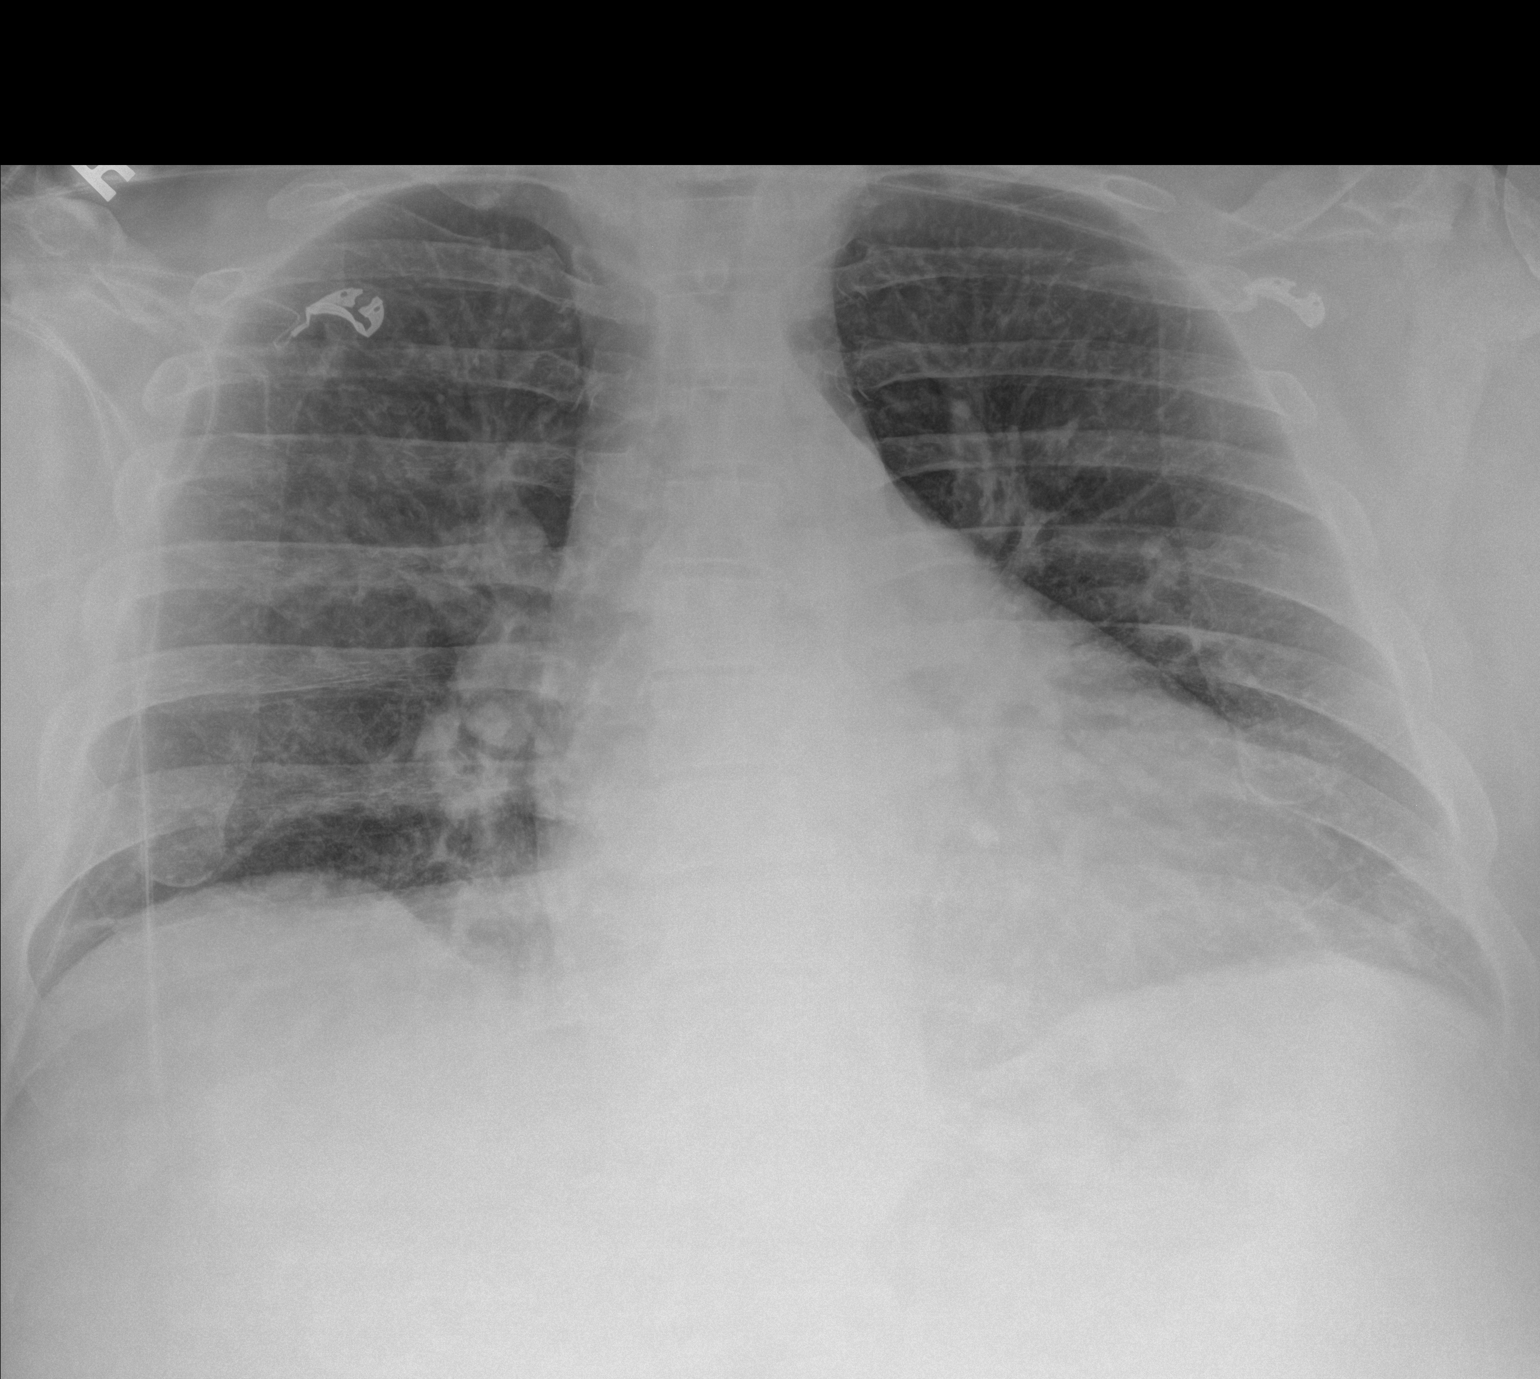

[1 of 1 positions shown; findings below may reference images not displayed]

FINDINGS: Significant interval improvement in heterogeneous airspace opacity
previously most conspicuous in the right lung. There remains
pulmonary vascular prominence. Cardiomegaly.
IMPRESSION: 1. Significant interval improvement in heterogeneous airspace
opacity previously most conspicuous in the right lung, consistent
with nearly resolved edema. There remains pulmonary vascular
prominence.

2.  Cardiomegaly.
